# Patient Record
Sex: Female | Born: 1935 | Race: Black or African American | Hispanic: No | Smoking: Never smoker
Health system: Southern US, Community
[De-identification: ages and names within clinical notes are randomized; demographics above are authoritative.]

## PROBLEM LIST (undated history)

## (undated) ENCOUNTER — Emergency Department (HOSPITAL_COMMUNITY): Admission: EM | Payer: Self-pay | Source: Home / Self Care

## (undated) DIAGNOSIS — I4729 Other ventricular tachycardia: Secondary | ICD-10-CM

## (undated) DIAGNOSIS — C55 Malignant neoplasm of uterus, part unspecified: Secondary | ICD-10-CM

## (undated) DIAGNOSIS — I272 Pulmonary hypertension, unspecified: Secondary | ICD-10-CM

## (undated) DIAGNOSIS — G4733 Obstructive sleep apnea (adult) (pediatric): Secondary | ICD-10-CM

## (undated) DIAGNOSIS — E1149 Type 2 diabetes mellitus with other diabetic neurological complication: Secondary | ICD-10-CM

## (undated) DIAGNOSIS — I6340 Cerebral infarction due to embolism of unspecified cerebral artery: Secondary | ICD-10-CM

## (undated) DIAGNOSIS — K922 Gastrointestinal hemorrhage, unspecified: Secondary | ICD-10-CM

## (undated) DIAGNOSIS — I2510 Atherosclerotic heart disease of native coronary artery without angina pectoris: Secondary | ICD-10-CM

## (undated) DIAGNOSIS — E785 Hyperlipidemia, unspecified: Secondary | ICD-10-CM

## (undated) DIAGNOSIS — K219 Gastro-esophageal reflux disease without esophagitis: Secondary | ICD-10-CM

## (undated) DIAGNOSIS — M545 Low back pain: Secondary | ICD-10-CM

## (undated) DIAGNOSIS — IMO0001 Reserved for inherently not codable concepts without codable children: Secondary | ICD-10-CM

## (undated) DIAGNOSIS — R51 Headache: Secondary | ICD-10-CM

## (undated) DIAGNOSIS — R519 Headache, unspecified: Secondary | ICD-10-CM

## (undated) DIAGNOSIS — Z5189 Encounter for other specified aftercare: Secondary | ICD-10-CM

## (undated) DIAGNOSIS — I10 Essential (primary) hypertension: Secondary | ICD-10-CM

## (undated) DIAGNOSIS — I639 Cerebral infarction, unspecified: Secondary | ICD-10-CM

## (undated) DIAGNOSIS — I214 Non-ST elevation (NSTEMI) myocardial infarction: Secondary | ICD-10-CM

## (undated) DIAGNOSIS — B9620 Unspecified Escherichia coli [E. coli] as the cause of diseases classified elsewhere: Secondary | ICD-10-CM

## (undated) DIAGNOSIS — D500 Iron deficiency anemia secondary to blood loss (chronic): Secondary | ICD-10-CM

## (undated) DIAGNOSIS — M810 Age-related osteoporosis without current pathological fracture: Secondary | ICD-10-CM

## (undated) DIAGNOSIS — N390 Urinary tract infection, site not specified: Secondary | ICD-10-CM

## (undated) DIAGNOSIS — I428 Other cardiomyopathies: Secondary | ICD-10-CM

## (undated) DIAGNOSIS — G609 Hereditary and idiopathic neuropathy, unspecified: Secondary | ICD-10-CM

## (undated) DIAGNOSIS — N179 Acute kidney failure, unspecified: Secondary | ICD-10-CM

## (undated) DIAGNOSIS — I219 Acute myocardial infarction, unspecified: Secondary | ICD-10-CM

## (undated) DIAGNOSIS — E119 Type 2 diabetes mellitus without complications: Secondary | ICD-10-CM

## (undated) DIAGNOSIS — I509 Heart failure, unspecified: Secondary | ICD-10-CM

## (undated) DIAGNOSIS — J189 Pneumonia, unspecified organism: Secondary | ICD-10-CM

## (undated) DIAGNOSIS — I209 Angina pectoris, unspecified: Secondary | ICD-10-CM

## (undated) DIAGNOSIS — K5521 Angiodysplasia of colon with hemorrhage: Secondary | ICD-10-CM

## (undated) DIAGNOSIS — N184 Chronic kidney disease, stage 4 (severe): Secondary | ICD-10-CM

## (undated) DIAGNOSIS — I472 Ventricular tachycardia: Secondary | ICD-10-CM

## (undated) HISTORY — DX: Heart failure, unspecified: I50.9

## (undated) HISTORY — DX: Unspecified Escherichia coli (E. coli) as the cause of diseases classified elsewhere: B96.20

## (undated) HISTORY — DX: Other cardiomyopathies: I42.8

## (undated) HISTORY — DX: Acute kidney failure, unspecified: N17.9

## (undated) HISTORY — DX: Urinary tract infection, site not specified: N39.0

## (undated) HISTORY — PX: CATARACT EXTRACTION W/ INTRAOCULAR LENS  IMPLANT, BILATERAL: SHX1307

## (undated) HISTORY — PX: TOE SURGERY: SHX1073

## (undated) HISTORY — PX: TUBAL LIGATION: SHX77

## (undated) HISTORY — DX: Other ventricular tachycardia: I47.29

## (undated) HISTORY — DX: Pulmonary hypertension, unspecified: I27.20

## (undated) HISTORY — PX: VAGINAL HYSTERECTOMY: SUR661

## (undated) HISTORY — DX: Iron deficiency anemia secondary to blood loss (chronic): D50.0

## (undated) HISTORY — DX: Cerebral infarction, unspecified: I63.9

## (undated) HISTORY — DX: Atherosclerotic heart disease of native coronary artery without angina pectoris: I25.10

## (undated) HISTORY — DX: Non-ST elevation (NSTEMI) myocardial infarction: I21.4

## (undated) HISTORY — DX: Ventricular tachycardia: I47.2

---

## 1983-11-04 DIAGNOSIS — E1165 Type 2 diabetes mellitus with hyperglycemia: Secondary | ICD-10-CM

## 1983-11-04 DIAGNOSIS — E119 Type 2 diabetes mellitus without complications: Secondary | ICD-10-CM

## 1983-11-04 HISTORY — DX: Type 2 diabetes mellitus without complications: E11.9

## 1998-03-14 ENCOUNTER — Encounter: Admission: RE | Admit: 1998-03-14 | Discharge: 1998-03-14 | Payer: Self-pay | Admitting: *Deleted

## 1998-06-26 ENCOUNTER — Encounter: Admission: RE | Admit: 1998-06-26 | Discharge: 1998-06-26 | Payer: Self-pay | Admitting: Hematology and Oncology

## 1998-07-03 ENCOUNTER — Encounter: Admission: RE | Admit: 1998-07-03 | Discharge: 1998-10-01 | Payer: Self-pay | Admitting: *Deleted

## 1998-07-10 ENCOUNTER — Encounter: Admission: RE | Admit: 1998-07-10 | Discharge: 1998-07-10 | Payer: Self-pay | Admitting: Hematology and Oncology

## 1998-08-13 ENCOUNTER — Emergency Department (HOSPITAL_COMMUNITY): Admission: EM | Admit: 1998-08-13 | Discharge: 1998-08-13 | Payer: Self-pay | Admitting: Emergency Medicine

## 1998-08-13 ENCOUNTER — Encounter: Payer: Self-pay | Admitting: Emergency Medicine

## 1998-08-23 ENCOUNTER — Emergency Department (HOSPITAL_COMMUNITY): Admission: EM | Admit: 1998-08-23 | Discharge: 1998-08-23 | Payer: Self-pay | Admitting: *Deleted

## 1998-08-23 ENCOUNTER — Encounter: Payer: Self-pay | Admitting: Emergency Medicine

## 1998-08-23 ENCOUNTER — Encounter: Payer: Self-pay | Admitting: *Deleted

## 1998-09-14 ENCOUNTER — Ambulatory Visit (HOSPITAL_BASED_OUTPATIENT_CLINIC_OR_DEPARTMENT_OTHER): Admission: RE | Admit: 1998-09-14 | Discharge: 1998-09-14 | Payer: Self-pay | Admitting: Surgery

## 1998-10-16 ENCOUNTER — Encounter: Admission: RE | Admit: 1998-10-16 | Discharge: 1998-10-16 | Payer: Self-pay | Admitting: Internal Medicine

## 1998-10-19 ENCOUNTER — Encounter: Admission: RE | Admit: 1998-10-19 | Discharge: 1999-01-17 | Payer: Self-pay | Admitting: *Deleted

## 1998-10-29 ENCOUNTER — Encounter: Admission: RE | Admit: 1998-10-29 | Discharge: 1999-01-24 | Payer: Self-pay | Admitting: Internal Medicine

## 1998-10-31 ENCOUNTER — Encounter (HOSPITAL_BASED_OUTPATIENT_CLINIC_OR_DEPARTMENT_OTHER): Payer: Self-pay | Admitting: Internal Medicine

## 1998-11-27 ENCOUNTER — Encounter: Payer: Self-pay | Admitting: Orthopedic Surgery

## 1999-01-18 ENCOUNTER — Encounter: Admission: RE | Admit: 1999-01-18 | Discharge: 1999-04-18 | Payer: Self-pay | Admitting: *Deleted

## 1999-01-21 ENCOUNTER — Encounter: Admission: RE | Admit: 1999-01-21 | Discharge: 1999-01-21 | Payer: Self-pay | Admitting: Internal Medicine

## 1999-01-22 ENCOUNTER — Encounter: Payer: Self-pay | Admitting: Orthopedic Surgery

## 1999-03-29 ENCOUNTER — Encounter: Admission: RE | Admit: 1999-03-29 | Discharge: 1999-06-27 | Payer: Self-pay | Admitting: *Deleted

## 1999-04-23 ENCOUNTER — Encounter: Admission: RE | Admit: 1999-04-23 | Discharge: 1999-07-22 | Payer: Self-pay | Admitting: Orthopedic Surgery

## 1999-05-27 ENCOUNTER — Encounter: Admission: RE | Admit: 1999-05-27 | Discharge: 1999-05-27 | Payer: Self-pay | Admitting: Internal Medicine

## 1999-06-28 ENCOUNTER — Encounter: Admission: RE | Admit: 1999-06-28 | Discharge: 1999-09-26 | Payer: Self-pay | Admitting: *Deleted

## 1999-07-29 ENCOUNTER — Encounter: Admission: RE | Admit: 1999-07-29 | Discharge: 1999-10-27 | Payer: Self-pay | Admitting: Orthopedic Surgery

## 1999-09-02 ENCOUNTER — Encounter (INDEPENDENT_AMBULATORY_CARE_PROVIDER_SITE_OTHER): Payer: Self-pay | Admitting: *Deleted

## 1999-09-02 ENCOUNTER — Other Ambulatory Visit: Admission: RE | Admit: 1999-09-02 | Discharge: 1999-09-02 | Payer: Self-pay | Admitting: Obstetrics

## 1999-09-02 ENCOUNTER — Encounter: Admission: RE | Admit: 1999-09-02 | Discharge: 1999-09-02 | Payer: Self-pay | Admitting: Internal Medicine

## 1999-09-20 ENCOUNTER — Encounter: Admission: RE | Admit: 1999-09-20 | Discharge: 1999-09-20 | Payer: Self-pay | Admitting: *Deleted

## 1999-11-04 ENCOUNTER — Encounter: Admission: RE | Admit: 1999-11-04 | Discharge: 1999-11-04 | Payer: Self-pay | Admitting: Internal Medicine

## 1999-11-05 ENCOUNTER — Encounter: Admission: RE | Admit: 1999-11-05 | Discharge: 1999-11-05 | Payer: Self-pay | Admitting: Internal Medicine

## 1999-11-05 ENCOUNTER — Encounter: Admission: RE | Admit: 1999-11-05 | Discharge: 2000-02-03 | Payer: Self-pay | Admitting: Internal Medicine

## 1999-12-26 ENCOUNTER — Encounter: Admission: RE | Admit: 1999-12-26 | Discharge: 1999-12-26 | Payer: Self-pay | Admitting: Internal Medicine

## 2000-01-30 ENCOUNTER — Encounter: Admission: RE | Admit: 2000-01-30 | Discharge: 2000-04-29 | Payer: Self-pay | Admitting: *Deleted

## 2000-02-07 ENCOUNTER — Encounter: Admission: RE | Admit: 2000-02-07 | Discharge: 2000-02-07 | Payer: Self-pay | Admitting: Internal Medicine

## 2000-02-25 ENCOUNTER — Encounter: Admission: RE | Admit: 2000-02-25 | Discharge: 2000-05-25 | Payer: Self-pay | Admitting: Orthopedic Surgery

## 2000-04-22 ENCOUNTER — Encounter: Admission: RE | Admit: 2000-04-22 | Discharge: 2000-04-22 | Payer: Self-pay | Admitting: Hematology and Oncology

## 2000-04-23 ENCOUNTER — Encounter: Admission: RE | Admit: 2000-04-23 | Discharge: 2000-04-23 | Payer: Self-pay | Admitting: Hematology and Oncology

## 2000-05-28 ENCOUNTER — Encounter: Admission: RE | Admit: 2000-05-28 | Discharge: 2000-08-26 | Payer: Self-pay | Admitting: *Deleted

## 2000-06-02 ENCOUNTER — Encounter: Admission: RE | Admit: 2000-06-02 | Discharge: 2000-08-31 | Payer: Self-pay | Admitting: Orthopedic Surgery

## 2000-08-05 ENCOUNTER — Encounter: Admission: RE | Admit: 2000-08-05 | Discharge: 2000-08-05 | Payer: Self-pay | Admitting: Internal Medicine

## 2000-09-07 ENCOUNTER — Encounter: Admission: RE | Admit: 2000-09-07 | Discharge: 2000-09-10 | Payer: Self-pay | Admitting: Orthopedic Surgery

## 2000-09-21 ENCOUNTER — Encounter: Admission: RE | Admit: 2000-09-21 | Discharge: 2000-09-21 | Payer: Self-pay | Admitting: *Deleted

## 2000-10-23 ENCOUNTER — Encounter: Admission: RE | Admit: 2000-10-23 | Discharge: 2001-01-21 | Payer: Self-pay | Admitting: *Deleted

## 2000-11-26 ENCOUNTER — Encounter: Admission: RE | Admit: 2000-11-26 | Discharge: 2000-11-26 | Payer: Self-pay

## 2000-12-28 ENCOUNTER — Encounter: Admission: RE | Admit: 2000-12-28 | Discharge: 2001-01-04 | Payer: Self-pay | Admitting: Orthopedic Surgery

## 2001-03-02 ENCOUNTER — Encounter: Admission: RE | Admit: 2001-03-02 | Discharge: 2001-03-02 | Payer: Self-pay | Admitting: Hematology and Oncology

## 2001-03-19 ENCOUNTER — Encounter: Admission: RE | Admit: 2001-03-19 | Discharge: 2001-06-17 | Payer: Self-pay | Admitting: *Deleted

## 2001-07-20 ENCOUNTER — Encounter: Admission: RE | Admit: 2001-07-20 | Discharge: 2001-07-20 | Payer: Self-pay | Admitting: *Deleted

## 2001-09-22 ENCOUNTER — Encounter: Admission: RE | Admit: 2001-09-22 | Discharge: 2001-09-22 | Payer: Self-pay

## 2001-11-14 ENCOUNTER — Encounter (INDEPENDENT_AMBULATORY_CARE_PROVIDER_SITE_OTHER): Payer: Self-pay | Admitting: *Deleted

## 2001-11-14 LAB — CONVERTED CEMR LAB: Pap Smear: NORMAL

## 2001-11-16 ENCOUNTER — Encounter: Admission: RE | Admit: 2001-11-16 | Discharge: 2001-11-16 | Payer: Self-pay | Admitting: *Deleted

## 2002-03-22 ENCOUNTER — Encounter: Admission: RE | Admit: 2002-03-22 | Discharge: 2002-03-22 | Payer: Self-pay | Admitting: *Deleted

## 2002-05-17 ENCOUNTER — Ambulatory Visit (HOSPITAL_COMMUNITY): Admission: RE | Admit: 2002-05-17 | Discharge: 2002-05-17 | Payer: Self-pay | Admitting: Cardiology

## 2002-05-17 ENCOUNTER — Encounter: Payer: Self-pay | Admitting: Cardiology

## 2002-07-14 ENCOUNTER — Encounter: Admission: RE | Admit: 2002-07-14 | Discharge: 2002-07-14 | Payer: Self-pay | Admitting: Internal Medicine

## 2002-09-19 ENCOUNTER — Encounter: Payer: Self-pay | Admitting: Internal Medicine

## 2002-09-19 ENCOUNTER — Encounter: Admission: RE | Admit: 2002-09-19 | Discharge: 2002-09-19 | Payer: Self-pay | Admitting: Internal Medicine

## 2002-11-16 ENCOUNTER — Encounter: Payer: Self-pay | Admitting: Internal Medicine

## 2002-11-16 ENCOUNTER — Encounter: Admission: RE | Admit: 2002-11-16 | Discharge: 2002-11-16 | Payer: Self-pay | Admitting: Internal Medicine

## 2002-11-16 ENCOUNTER — Ambulatory Visit (HOSPITAL_COMMUNITY): Admission: RE | Admit: 2002-11-16 | Discharge: 2002-11-16 | Payer: Self-pay | Admitting: Internal Medicine

## 2003-01-10 ENCOUNTER — Encounter: Admission: RE | Admit: 2003-01-10 | Discharge: 2003-01-10 | Payer: Self-pay | Admitting: Internal Medicine

## 2003-03-16 ENCOUNTER — Encounter: Admission: RE | Admit: 2003-03-16 | Discharge: 2003-03-16 | Payer: Self-pay | Admitting: Internal Medicine

## 2003-06-20 ENCOUNTER — Encounter: Admission: RE | Admit: 2003-06-20 | Discharge: 2003-06-20 | Payer: Self-pay | Admitting: Internal Medicine

## 2003-07-24 ENCOUNTER — Encounter: Admission: RE | Admit: 2003-07-24 | Discharge: 2003-07-24 | Payer: Self-pay | Admitting: Internal Medicine

## 2003-09-22 ENCOUNTER — Encounter: Admission: RE | Admit: 2003-09-22 | Discharge: 2003-09-22 | Payer: Self-pay | Admitting: Internal Medicine

## 2003-10-02 ENCOUNTER — Encounter: Admission: RE | Admit: 2003-10-02 | Discharge: 2003-10-02 | Payer: Self-pay | Admitting: Internal Medicine

## 2003-10-20 ENCOUNTER — Encounter: Admission: RE | Admit: 2003-10-20 | Discharge: 2003-10-20 | Payer: Self-pay | Admitting: Internal Medicine

## 2003-11-13 ENCOUNTER — Encounter: Admission: RE | Admit: 2003-11-13 | Discharge: 2003-11-13 | Payer: Self-pay | Admitting: Internal Medicine

## 2004-02-12 ENCOUNTER — Encounter: Admission: RE | Admit: 2004-02-12 | Discharge: 2004-02-12 | Payer: Self-pay | Admitting: Internal Medicine

## 2004-02-19 ENCOUNTER — Encounter: Admission: RE | Admit: 2004-02-19 | Discharge: 2004-02-19 | Payer: Self-pay | Admitting: Internal Medicine

## 2004-03-04 ENCOUNTER — Encounter: Admission: RE | Admit: 2004-03-04 | Discharge: 2004-03-04 | Payer: Self-pay | Admitting: Internal Medicine

## 2004-03-18 ENCOUNTER — Encounter: Admission: RE | Admit: 2004-03-18 | Discharge: 2004-03-18 | Payer: Self-pay | Admitting: Internal Medicine

## 2004-07-04 ENCOUNTER — Ambulatory Visit: Payer: Self-pay | Admitting: Internal Medicine

## 2004-10-28 ENCOUNTER — Ambulatory Visit: Payer: Self-pay

## 2004-11-05 ENCOUNTER — Encounter: Admission: RE | Admit: 2004-11-05 | Discharge: 2004-11-05 | Payer: Self-pay | Admitting: Internal Medicine

## 2004-11-27 ENCOUNTER — Emergency Department (HOSPITAL_COMMUNITY): Admission: EM | Admit: 2004-11-27 | Discharge: 2004-11-27 | Payer: Self-pay | Admitting: Emergency Medicine

## 2004-12-17 ENCOUNTER — Ambulatory Visit: Payer: Self-pay | Admitting: Internal Medicine

## 2004-12-20 ENCOUNTER — Emergency Department (HOSPITAL_COMMUNITY): Admission: EM | Admit: 2004-12-20 | Discharge: 2004-12-20 | Payer: Self-pay | Admitting: Emergency Medicine

## 2004-12-26 ENCOUNTER — Ambulatory Visit: Payer: Self-pay | Admitting: Internal Medicine

## 2005-02-13 ENCOUNTER — Ambulatory Visit: Payer: Self-pay | Admitting: Internal Medicine

## 2005-07-07 ENCOUNTER — Inpatient Hospital Stay (HOSPITAL_COMMUNITY): Admission: EM | Admit: 2005-07-07 | Discharge: 2005-07-11 | Payer: Self-pay | Admitting: Emergency Medicine

## 2005-07-08 ENCOUNTER — Encounter (INDEPENDENT_AMBULATORY_CARE_PROVIDER_SITE_OTHER): Payer: Self-pay | Admitting: *Deleted

## 2005-07-08 ENCOUNTER — Ambulatory Visit: Payer: Self-pay | Admitting: Internal Medicine

## 2005-08-06 ENCOUNTER — Ambulatory Visit (HOSPITAL_COMMUNITY): Admission: RE | Admit: 2005-08-06 | Discharge: 2005-08-06 | Payer: Self-pay | Admitting: Internal Medicine

## 2005-08-06 ENCOUNTER — Ambulatory Visit: Payer: Self-pay | Admitting: Internal Medicine

## 2005-08-07 ENCOUNTER — Ambulatory Visit (HOSPITAL_COMMUNITY): Admission: RE | Admit: 2005-08-07 | Discharge: 2005-08-07 | Payer: Self-pay | Admitting: Internal Medicine

## 2005-08-26 ENCOUNTER — Ambulatory Visit (HOSPITAL_COMMUNITY): Admission: RE | Admit: 2005-08-26 | Discharge: 2005-08-26 | Payer: Self-pay | Admitting: Gastroenterology

## 2005-09-08 ENCOUNTER — Ambulatory Visit: Payer: Self-pay | Admitting: Internal Medicine

## 2005-09-22 ENCOUNTER — Ambulatory Visit (HOSPITAL_COMMUNITY): Admission: RE | Admit: 2005-09-22 | Discharge: 2005-09-22 | Payer: Self-pay | Admitting: Gastroenterology

## 2005-10-17 HISTORY — PX: OTHER SURGICAL HISTORY: SHX169

## 2005-10-23 ENCOUNTER — Ambulatory Visit: Payer: Self-pay | Admitting: Internal Medicine

## 2005-11-06 ENCOUNTER — Ambulatory Visit (HOSPITAL_COMMUNITY): Admission: RE | Admit: 2005-11-06 | Discharge: 2005-11-06 | Payer: Self-pay | Admitting: Family Medicine

## 2006-01-22 ENCOUNTER — Ambulatory Visit: Payer: Self-pay | Admitting: Internal Medicine

## 2006-01-23 ENCOUNTER — Ambulatory Visit: Payer: Self-pay | Admitting: Internal Medicine

## 2006-05-20 ENCOUNTER — Ambulatory Visit: Payer: Self-pay | Admitting: Internal Medicine

## 2006-05-20 ENCOUNTER — Encounter (INDEPENDENT_AMBULATORY_CARE_PROVIDER_SITE_OTHER): Payer: Self-pay | Admitting: *Deleted

## 2006-07-24 ENCOUNTER — Ambulatory Visit: Payer: Self-pay | Admitting: Hospitalist

## 2006-07-24 ENCOUNTER — Inpatient Hospital Stay (HOSPITAL_COMMUNITY): Admission: AD | Admit: 2006-07-24 | Discharge: 2006-07-25 | Payer: Self-pay | Admitting: Hospitalist

## 2006-07-24 ENCOUNTER — Ambulatory Visit: Payer: Self-pay | Admitting: Internal Medicine

## 2006-08-03 ENCOUNTER — Encounter (INDEPENDENT_AMBULATORY_CARE_PROVIDER_SITE_OTHER): Payer: Self-pay | Admitting: *Deleted

## 2006-08-03 DIAGNOSIS — K219 Gastro-esophageal reflux disease without esophagitis: Secondary | ICD-10-CM

## 2006-08-03 DIAGNOSIS — G609 Hereditary and idiopathic neuropathy, unspecified: Secondary | ICD-10-CM | POA: Insufficient documentation

## 2006-08-03 DIAGNOSIS — I10 Essential (primary) hypertension: Secondary | ICD-10-CM

## 2006-08-03 DIAGNOSIS — J45909 Unspecified asthma, uncomplicated: Secondary | ICD-10-CM | POA: Insufficient documentation

## 2006-08-03 DIAGNOSIS — M545 Low back pain, unspecified: Secondary | ICD-10-CM

## 2006-08-03 DIAGNOSIS — E669 Obesity, unspecified: Secondary | ICD-10-CM | POA: Insufficient documentation

## 2006-08-03 DIAGNOSIS — E1149 Type 2 diabetes mellitus with other diabetic neurological complication: Secondary | ICD-10-CM

## 2006-08-03 DIAGNOSIS — E785 Hyperlipidemia, unspecified: Secondary | ICD-10-CM

## 2006-08-03 DIAGNOSIS — I5032 Chronic diastolic (congestive) heart failure: Secondary | ICD-10-CM | POA: Insufficient documentation

## 2006-08-03 HISTORY — DX: Low back pain, unspecified: M54.50

## 2006-08-03 HISTORY — DX: Essential (primary) hypertension: I10

## 2006-08-03 HISTORY — DX: Type 2 diabetes mellitus with other diabetic neurological complication: E11.49

## 2006-08-03 HISTORY — DX: Hyperlipidemia, unspecified: E78.5

## 2006-08-03 HISTORY — DX: Hereditary and idiopathic neuropathy, unspecified: G60.9

## 2006-08-03 HISTORY — DX: Gastro-esophageal reflux disease without esophagitis: K21.9

## 2006-08-06 ENCOUNTER — Ambulatory Visit: Payer: Self-pay | Admitting: Internal Medicine

## 2006-08-06 ENCOUNTER — Encounter (INDEPENDENT_AMBULATORY_CARE_PROVIDER_SITE_OTHER): Payer: Self-pay | Admitting: *Deleted

## 2006-08-06 LAB — CONVERTED CEMR LAB
BUN: 13 mg/dL (ref 6–23)
Calcium: 9.4 mg/dL (ref 8.4–10.5)
Chloride: 107 meq/L (ref 96–112)
Creatinine, Ser: 1.03 mg/dL (ref 0.40–1.20)
Creatinine, Urine: 85.6 mg/dL
Glucose, Bld: 73 mg/dL (ref 70–99)
Microalb, Ur: 27.7 mg/dL — ABNORMAL HIGH (ref 0.00–1.89)
Microalbumin U total vol: 27.7 mg/L

## 2006-08-07 HISTORY — PX: CARDIOVASCULAR STRESS TEST: SHX262

## 2006-08-20 ENCOUNTER — Ambulatory Visit: Payer: Self-pay | Admitting: Hospitalist

## 2006-08-31 ENCOUNTER — Ambulatory Visit (HOSPITAL_COMMUNITY): Admission: RE | Admit: 2006-08-31 | Discharge: 2006-08-31 | Payer: Self-pay | Admitting: Internal Medicine

## 2006-09-09 ENCOUNTER — Ambulatory Visit: Payer: Self-pay | Admitting: Internal Medicine

## 2006-10-16 ENCOUNTER — Encounter (INDEPENDENT_AMBULATORY_CARE_PROVIDER_SITE_OTHER): Payer: Self-pay | Admitting: *Deleted

## 2006-10-16 ENCOUNTER — Ambulatory Visit: Payer: Self-pay | Admitting: Hospitalist

## 2006-10-16 LAB — CONVERTED CEMR LAB
HDL: 89 mg/dL (ref >39)
LDL Cholesterol: 95 mg/dL (ref 0–99)
Triglycerides: 66 mg/dL (ref <150)
VLDL: 13 mg/dL (ref 0–40)

## 2006-10-23 ENCOUNTER — Encounter (INDEPENDENT_AMBULATORY_CARE_PROVIDER_SITE_OTHER): Payer: Self-pay | Admitting: *Deleted

## 2006-10-23 ENCOUNTER — Ambulatory Visit: Payer: Self-pay | Admitting: Internal Medicine

## 2006-10-23 LAB — CONVERTED CEMR LAB
ALT: 9 units/L (ref 0–35)
AST: 12 units/L (ref 0–37)
Alkaline Phosphatase: 74 units/L (ref 39–117)
BUN: 22 mg/dL (ref 6–23)
CO2: 28 meq/L (ref 19–32)
Glucose, Bld: 273 mg/dL — ABNORMAL HIGH (ref 70–99)
Potassium: 4.1 meq/L (ref 3.5–5.3)
Total Protein: 7.2 g/dL (ref 6.0–8.3)

## 2006-11-09 ENCOUNTER — Ambulatory Visit (HOSPITAL_COMMUNITY): Admission: RE | Admit: 2006-11-09 | Discharge: 2006-11-09 | Payer: Self-pay | Admitting: Internal Medicine

## 2006-11-19 ENCOUNTER — Emergency Department (HOSPITAL_COMMUNITY): Admission: EM | Admit: 2006-11-19 | Discharge: 2006-11-19 | Payer: Self-pay | Admitting: Family Medicine

## 2007-02-16 ENCOUNTER — Ambulatory Visit: Payer: Self-pay | Admitting: Internal Medicine

## 2007-02-16 DIAGNOSIS — J309 Allergic rhinitis, unspecified: Secondary | ICD-10-CM | POA: Insufficient documentation

## 2007-06-11 ENCOUNTER — Telehealth: Payer: Self-pay | Admitting: *Deleted

## 2007-06-29 ENCOUNTER — Ambulatory Visit: Payer: Self-pay | Admitting: Hospitalist

## 2007-06-29 ENCOUNTER — Encounter (INDEPENDENT_AMBULATORY_CARE_PROVIDER_SITE_OTHER): Payer: Self-pay | Admitting: *Deleted

## 2007-06-29 DIAGNOSIS — M2550 Pain in unspecified joint: Secondary | ICD-10-CM

## 2007-06-29 LAB — CONVERTED CEMR LAB
AST: 16 units/L (ref 0–37)
Blood Glucose, Fingerstick: 183
Chloride: 105 meq/L (ref 96–112)
Total Bilirubin: 0.4 mg/dL (ref 0.3–1.2)

## 2007-07-30 ENCOUNTER — Ambulatory Visit: Payer: Self-pay | Admitting: *Deleted

## 2007-07-30 LAB — CONVERTED CEMR LAB: Blood Glucose, Home Monitor: 2 mg/dL

## 2007-09-17 ENCOUNTER — Ambulatory Visit: Payer: Self-pay | Admitting: Internal Medicine

## 2007-10-02 ENCOUNTER — Emergency Department (HOSPITAL_COMMUNITY): Admission: EM | Admit: 2007-10-02 | Discharge: 2007-10-02 | Payer: Self-pay | Admitting: Emergency Medicine

## 2007-11-09 ENCOUNTER — Encounter (INDEPENDENT_AMBULATORY_CARE_PROVIDER_SITE_OTHER): Payer: Self-pay | Admitting: *Deleted

## 2007-11-10 ENCOUNTER — Telehealth (INDEPENDENT_AMBULATORY_CARE_PROVIDER_SITE_OTHER): Payer: Self-pay | Admitting: *Deleted

## 2007-11-26 ENCOUNTER — Ambulatory Visit (HOSPITAL_COMMUNITY): Admission: RE | Admit: 2007-11-26 | Discharge: 2007-11-26 | Payer: Self-pay | Admitting: Infectious Diseases

## 2007-12-09 ENCOUNTER — Ambulatory Visit: Payer: Self-pay | Admitting: Internal Medicine

## 2007-12-09 LAB — CONVERTED CEMR LAB: Blood Glucose, Fingerstick: 291

## 2008-01-04 ENCOUNTER — Emergency Department (HOSPITAL_COMMUNITY): Admission: EM | Admit: 2008-01-04 | Discharge: 2008-01-04 | Payer: Self-pay | Admitting: Emergency Medicine

## 2008-01-06 ENCOUNTER — Telehealth: Payer: Self-pay | Admitting: *Deleted

## 2008-01-06 ENCOUNTER — Ambulatory Visit (HOSPITAL_COMMUNITY): Admission: RE | Admit: 2008-01-06 | Discharge: 2008-01-06 | Payer: Self-pay | Admitting: *Deleted

## 2008-01-06 ENCOUNTER — Ambulatory Visit: Payer: Self-pay | Admitting: *Deleted

## 2008-01-06 ENCOUNTER — Encounter (INDEPENDENT_AMBULATORY_CARE_PROVIDER_SITE_OTHER): Payer: Self-pay | Admitting: Internal Medicine

## 2008-01-06 DIAGNOSIS — D500 Iron deficiency anemia secondary to blood loss (chronic): Secondary | ICD-10-CM

## 2008-01-06 DIAGNOSIS — G4733 Obstructive sleep apnea (adult) (pediatric): Secondary | ICD-10-CM

## 2008-01-06 HISTORY — DX: Obstructive sleep apnea (adult) (pediatric): G47.33

## 2008-01-06 HISTORY — DX: Iron deficiency anemia secondary to blood loss (chronic): D50.0

## 2008-01-09 LAB — CONVERTED CEMR LAB
Calcium: 9.5 mg/dL (ref 8.4–10.5)
Chloride: 105 meq/L (ref 96–112)
Creatinine, Ser: 1.43 mg/dL — ABNORMAL HIGH (ref 0.40–1.20)
Glucose, Bld: 271 mg/dL — ABNORMAL HIGH (ref 70–99)
MCHC: 32 g/dL (ref 30.0–36.0)
MCV: 92 fL (ref 78.0–100.0)
Potassium: 3.9 meq/L (ref 3.5–5.3)
RBC: 3.98 M/uL (ref 3.87–5.11)
RDW: 14.7 % (ref 11.5–15.5)
Sodium: 143 meq/L (ref 135–145)
WBC: 6.4 10*3/uL (ref 4.0–10.5)

## 2008-01-10 ENCOUNTER — Telehealth: Payer: Self-pay | Admitting: *Deleted

## 2008-01-12 ENCOUNTER — Encounter (INDEPENDENT_AMBULATORY_CARE_PROVIDER_SITE_OTHER): Payer: Self-pay | Admitting: *Deleted

## 2008-01-12 ENCOUNTER — Ambulatory Visit: Payer: Self-pay | Admitting: *Deleted

## 2008-01-12 LAB — CONVERTED CEMR LAB
Hemoglobin: 12.4 g/dL (ref 12.0–15.0)
MCHC: 33.4 g/dL (ref 30.0–36.0)
RBC: 4.1 M/uL (ref 3.87–5.11)
RDW: 14.7 % (ref 11.5–15.5)

## 2008-01-13 LAB — CONVERTED CEMR LAB
CO2: 22 meq/L (ref 19–32)
Chloride: 105 meq/L (ref 96–112)
Creatinine, Ser: 0.89 mg/dL (ref 0.40–1.20)
Iron: 112 ug/dL (ref 42–145)
Saturation Ratios: 32 % (ref 20–55)
Sodium: 141 meq/L (ref 135–145)
TIBC: 349 ug/dL (ref 250–470)
TSH: 1.546 microintl units/mL (ref 0.350–5.50)
UIBC: 237 ug/dL
Vitamin B-12: 434 pg/mL (ref 211–911)

## 2008-01-20 ENCOUNTER — Telehealth: Payer: Self-pay | Admitting: *Deleted

## 2008-02-01 ENCOUNTER — Encounter (INDEPENDENT_AMBULATORY_CARE_PROVIDER_SITE_OTHER): Payer: Self-pay | Admitting: *Deleted

## 2008-02-21 ENCOUNTER — Telehealth (INDEPENDENT_AMBULATORY_CARE_PROVIDER_SITE_OTHER): Payer: Self-pay | Admitting: *Deleted

## 2008-03-20 ENCOUNTER — Telehealth: Payer: Self-pay | Admitting: *Deleted

## 2008-05-02 ENCOUNTER — Telehealth (INDEPENDENT_AMBULATORY_CARE_PROVIDER_SITE_OTHER): Payer: Self-pay | Admitting: *Deleted

## 2008-05-18 ENCOUNTER — Ambulatory Visit: Payer: Self-pay | Admitting: Infectious Diseases

## 2008-05-18 DIAGNOSIS — K5900 Constipation, unspecified: Secondary | ICD-10-CM | POA: Insufficient documentation

## 2008-07-10 ENCOUNTER — Ambulatory Visit: Payer: Self-pay | Admitting: Infectious Diseases

## 2008-07-10 ENCOUNTER — Inpatient Hospital Stay (HOSPITAL_COMMUNITY): Admission: EM | Admit: 2008-07-10 | Discharge: 2008-07-13 | Payer: Self-pay | Admitting: Emergency Medicine

## 2008-07-11 ENCOUNTER — Encounter: Payer: Self-pay | Admitting: Infectious Diseases

## 2008-07-12 ENCOUNTER — Ambulatory Visit: Payer: Self-pay | Admitting: Vascular Surgery

## 2008-07-12 ENCOUNTER — Encounter: Payer: Self-pay | Admitting: Internal Medicine

## 2008-07-17 ENCOUNTER — Encounter (INDEPENDENT_AMBULATORY_CARE_PROVIDER_SITE_OTHER): Payer: Self-pay | Admitting: *Deleted

## 2008-07-24 ENCOUNTER — Ambulatory Visit: Payer: Self-pay | Admitting: Internal Medicine

## 2008-07-24 DIAGNOSIS — G4700 Insomnia, unspecified: Secondary | ICD-10-CM

## 2008-07-24 DIAGNOSIS — M1990 Unspecified osteoarthritis, unspecified site: Secondary | ICD-10-CM | POA: Insufficient documentation

## 2008-07-26 ENCOUNTER — Encounter (INDEPENDENT_AMBULATORY_CARE_PROVIDER_SITE_OTHER): Payer: Self-pay | Admitting: *Deleted

## 2008-08-15 ENCOUNTER — Encounter (INDEPENDENT_AMBULATORY_CARE_PROVIDER_SITE_OTHER): Payer: Self-pay | Admitting: *Deleted

## 2008-12-04 ENCOUNTER — Encounter (INDEPENDENT_AMBULATORY_CARE_PROVIDER_SITE_OTHER): Payer: Self-pay | Admitting: *Deleted

## 2008-12-04 ENCOUNTER — Ambulatory Visit: Payer: Self-pay | Admitting: Internal Medicine

## 2008-12-05 ENCOUNTER — Encounter (INDEPENDENT_AMBULATORY_CARE_PROVIDER_SITE_OTHER): Payer: Self-pay | Admitting: *Deleted

## 2008-12-14 ENCOUNTER — Telehealth (INDEPENDENT_AMBULATORY_CARE_PROVIDER_SITE_OTHER): Payer: Self-pay | Admitting: *Deleted

## 2008-12-22 ENCOUNTER — Ambulatory Visit (HOSPITAL_COMMUNITY): Admission: RE | Admit: 2008-12-22 | Discharge: 2008-12-22 | Payer: Self-pay | Admitting: *Deleted

## 2008-12-27 ENCOUNTER — Encounter (INDEPENDENT_AMBULATORY_CARE_PROVIDER_SITE_OTHER): Payer: Self-pay | Admitting: *Deleted

## 2008-12-28 ENCOUNTER — Encounter (INDEPENDENT_AMBULATORY_CARE_PROVIDER_SITE_OTHER): Payer: Self-pay | Admitting: *Deleted

## 2008-12-28 ENCOUNTER — Ambulatory Visit: Payer: Self-pay | Admitting: Internal Medicine

## 2008-12-28 LAB — CONVERTED CEMR LAB
ALT: 11 units/L (ref 0–35)
AST: 13 units/L (ref 0–37)
Alkaline Phosphatase: 71 units/L (ref 39–117)
CO2: 24 meq/L (ref 19–32)
Cholesterol: 187 mg/dL (ref 0–200)
LDL Cholesterol: 91 mg/dL (ref 0–99)
Potassium: 3.4 meq/L — ABNORMAL LOW (ref 3.5–5.3)
Sodium: 143 meq/L (ref 135–145)
Total Bilirubin: 0.3 mg/dL (ref 0.3–1.2)

## 2009-01-09 ENCOUNTER — Telehealth (INDEPENDENT_AMBULATORY_CARE_PROVIDER_SITE_OTHER): Payer: Self-pay | Admitting: *Deleted

## 2009-01-17 ENCOUNTER — Telehealth (INDEPENDENT_AMBULATORY_CARE_PROVIDER_SITE_OTHER): Payer: Self-pay | Admitting: *Deleted

## 2009-02-14 ENCOUNTER — Telehealth (INDEPENDENT_AMBULATORY_CARE_PROVIDER_SITE_OTHER): Payer: Self-pay | Admitting: *Deleted

## 2009-03-07 ENCOUNTER — Ambulatory Visit: Payer: Self-pay | Admitting: Infectious Disease

## 2009-03-07 ENCOUNTER — Ambulatory Visit (HOSPITAL_COMMUNITY): Admission: RE | Admit: 2009-03-07 | Discharge: 2009-03-07 | Payer: Self-pay | Admitting: Infectious Disease

## 2009-03-07 LAB — CONVERTED CEMR LAB
Blood Glucose, Fingerstick: 225
Blood Glucose, Home Monitor: 3 mg/dL

## 2009-03-14 ENCOUNTER — Encounter (INDEPENDENT_AMBULATORY_CARE_PROVIDER_SITE_OTHER): Payer: Self-pay | Admitting: *Deleted

## 2009-03-14 ENCOUNTER — Ambulatory Visit (HOSPITAL_COMMUNITY): Admission: RE | Admit: 2009-03-14 | Discharge: 2009-03-14 | Payer: Self-pay | Admitting: *Deleted

## 2009-03-21 DIAGNOSIS — M810 Age-related osteoporosis without current pathological fracture: Secondary | ICD-10-CM | POA: Insufficient documentation

## 2009-03-21 HISTORY — DX: Age-related osteoporosis without current pathological fracture: M81.0

## 2009-04-03 ENCOUNTER — Telehealth (INDEPENDENT_AMBULATORY_CARE_PROVIDER_SITE_OTHER): Payer: Self-pay | Admitting: *Deleted

## 2009-06-12 ENCOUNTER — Telehealth: Payer: Self-pay | Admitting: Internal Medicine

## 2009-07-12 ENCOUNTER — Ambulatory Visit: Payer: Self-pay | Admitting: Internal Medicine

## 2009-07-12 DIAGNOSIS — R519 Headache, unspecified: Secondary | ICD-10-CM | POA: Insufficient documentation

## 2009-07-12 DIAGNOSIS — R51 Headache: Secondary | ICD-10-CM

## 2009-07-15 LAB — CONVERTED CEMR LAB
Albumin: 3.9 g/dL (ref 3.5–5.2)
Alkaline Phosphatase: 61 units/L (ref 39–117)
Chloride: 104 meq/L (ref 96–112)
Glucose, Bld: 315 mg/dL — ABNORMAL HIGH (ref 70–99)
LDL Cholesterol: 152 mg/dL — ABNORMAL HIGH (ref 0–99)
Microalb, Ur: 6.05 mg/dL — ABNORMAL HIGH (ref 0.00–1.89)
Potassium: 4.5 meq/L (ref 3.5–5.3)
Sodium: 143 meq/L (ref 135–145)
Total Protein: 7 g/dL (ref 6.0–8.3)
Triglycerides: 80 mg/dL (ref <150)

## 2009-07-16 ENCOUNTER — Encounter: Payer: Self-pay | Admitting: Licensed Clinical Social Worker

## 2009-07-20 ENCOUNTER — Encounter: Payer: Self-pay | Admitting: Internal Medicine

## 2009-07-20 LAB — HM DIABETES EYE EXAM

## 2009-07-27 ENCOUNTER — Ambulatory Visit: Payer: Self-pay | Admitting: Internal Medicine

## 2009-08-01 ENCOUNTER — Telehealth: Payer: Self-pay | Admitting: Internal Medicine

## 2009-08-02 ENCOUNTER — Telehealth: Payer: Self-pay | Admitting: *Deleted

## 2009-08-15 ENCOUNTER — Telehealth (INDEPENDENT_AMBULATORY_CARE_PROVIDER_SITE_OTHER): Payer: Self-pay | Admitting: *Deleted

## 2009-09-14 ENCOUNTER — Telehealth: Payer: Self-pay | Admitting: Internal Medicine

## 2009-11-12 ENCOUNTER — Telehealth: Payer: Self-pay | Admitting: Internal Medicine

## 2009-12-28 ENCOUNTER — Ambulatory Visit (HOSPITAL_COMMUNITY): Admission: RE | Admit: 2009-12-28 | Discharge: 2009-12-28 | Payer: Self-pay | Admitting: Family Medicine

## 2009-12-28 LAB — HM MAMMOGRAPHY: HM Mammogram: NEGATIVE

## 2010-01-02 ENCOUNTER — Telehealth: Payer: Self-pay | Admitting: Internal Medicine

## 2010-02-04 ENCOUNTER — Telehealth: Payer: Self-pay | Admitting: Internal Medicine

## 2010-02-22 ENCOUNTER — Telehealth: Payer: Self-pay | Admitting: Internal Medicine

## 2010-03-12 ENCOUNTER — Telehealth: Payer: Self-pay | Admitting: Internal Medicine

## 2010-03-18 ENCOUNTER — Telehealth: Payer: Self-pay | Admitting: *Deleted

## 2010-04-30 ENCOUNTER — Inpatient Hospital Stay (HOSPITAL_COMMUNITY): Admission: EM | Admit: 2010-04-30 | Discharge: 2010-05-01 | Payer: Self-pay | Admitting: Emergency Medicine

## 2010-04-30 ENCOUNTER — Ambulatory Visit: Payer: Self-pay | Admitting: Internal Medicine

## 2010-04-30 ENCOUNTER — Encounter: Payer: Self-pay | Admitting: Ophthalmology

## 2010-05-01 ENCOUNTER — Encounter: Payer: Self-pay | Admitting: Internal Medicine

## 2010-05-02 ENCOUNTER — Telehealth: Payer: Self-pay | Admitting: Internal Medicine

## 2010-05-03 ENCOUNTER — Telehealth: Payer: Self-pay | Admitting: Internal Medicine

## 2010-05-13 DIAGNOSIS — I428 Other cardiomyopathies: Secondary | ICD-10-CM

## 2010-05-13 HISTORY — DX: Other cardiomyopathies: I42.8

## 2010-05-14 ENCOUNTER — Encounter: Payer: Self-pay | Admitting: Internal Medicine

## 2010-05-15 ENCOUNTER — Encounter: Admission: RE | Admit: 2010-05-15 | Discharge: 2010-05-15 | Payer: Self-pay | Admitting: Cardiology

## 2010-05-16 ENCOUNTER — Encounter: Payer: Self-pay | Admitting: Internal Medicine

## 2010-05-17 ENCOUNTER — Encounter: Payer: Self-pay | Admitting: Internal Medicine

## 2010-05-17 ENCOUNTER — Ambulatory Visit: Payer: Self-pay | Admitting: Internal Medicine

## 2010-05-17 LAB — CONVERTED CEMR LAB
Cholesterol: 137 mg/dL (ref 0–200)
Hgb A1c MFr Bld: 8.8 %
Triglycerides: 54 mg/dL (ref <150)
VLDL: 11 mg/dL (ref 0–40)

## 2010-05-20 ENCOUNTER — Telehealth: Payer: Self-pay | Admitting: Internal Medicine

## 2010-05-24 ENCOUNTER — Inpatient Hospital Stay (HOSPITAL_COMMUNITY): Admission: RE | Admit: 2010-05-24 | Discharge: 2010-05-29 | Payer: Self-pay | Admitting: Cardiology

## 2010-05-24 HISTORY — PX: CARDIAC CATHETERIZATION: SHX172

## 2010-05-27 ENCOUNTER — Encounter (INDEPENDENT_AMBULATORY_CARE_PROVIDER_SITE_OTHER): Payer: Self-pay | Admitting: Internal Medicine

## 2010-05-28 ENCOUNTER — Ambulatory Visit: Payer: Self-pay | Admitting: Vascular Surgery

## 2010-05-28 ENCOUNTER — Encounter (INDEPENDENT_AMBULATORY_CARE_PROVIDER_SITE_OTHER): Payer: Self-pay | Admitting: Cardiology

## 2010-05-28 HISTORY — PX: OTHER SURGICAL HISTORY: SHX169

## 2010-05-30 ENCOUNTER — Telehealth: Payer: Self-pay | Admitting: Internal Medicine

## 2010-06-03 ENCOUNTER — Telehealth: Payer: Self-pay | Admitting: Internal Medicine

## 2010-06-07 ENCOUNTER — Encounter: Payer: Self-pay | Admitting: Internal Medicine

## 2010-06-07 ENCOUNTER — Inpatient Hospital Stay (HOSPITAL_COMMUNITY): Admission: EM | Admit: 2010-06-07 | Discharge: 2010-06-16 | Payer: Self-pay | Admitting: Emergency Medicine

## 2010-06-07 ENCOUNTER — Ambulatory Visit: Payer: Self-pay | Admitting: Internal Medicine

## 2010-06-16 ENCOUNTER — Encounter: Payer: Self-pay | Admitting: Internal Medicine

## 2010-06-19 ENCOUNTER — Telehealth: Payer: Self-pay | Admitting: Internal Medicine

## 2010-06-20 ENCOUNTER — Telehealth: Payer: Self-pay | Admitting: Internal Medicine

## 2010-06-21 ENCOUNTER — Encounter: Payer: Self-pay | Admitting: Internal Medicine

## 2010-06-21 ENCOUNTER — Ambulatory Visit: Payer: Self-pay | Admitting: Internal Medicine

## 2010-06-21 DIAGNOSIS — E162 Hypoglycemia, unspecified: Secondary | ICD-10-CM

## 2010-06-24 LAB — CONVERTED CEMR LAB
Albumin: 2.6 g/dL — ABNORMAL LOW (ref 3.5–5.2)
BUN: 10 mg/dL (ref 6–23)
Calcium: 8.8 mg/dL (ref 8.4–10.5)
Chloride: 108 meq/L (ref 96–112)
Glucose, Bld: 122 mg/dL — ABNORMAL HIGH (ref 70–99)
Hemoglobin, Urine: NEGATIVE
INR: 2.28 — ABNORMAL HIGH (ref <1.50)
Nitrite: NEGATIVE
Potassium: 4.1 meq/L (ref 3.5–5.3)
Protein, ur: NEGATIVE mg/dL

## 2010-06-27 ENCOUNTER — Ambulatory Visit: Payer: Self-pay | Admitting: Internal Medicine

## 2010-06-27 DIAGNOSIS — R079 Chest pain, unspecified: Secondary | ICD-10-CM | POA: Insufficient documentation

## 2010-06-28 LAB — CONVERTED CEMR LAB
CO2: 26 meq/L (ref 19–32)
Glucose, Bld: 95 mg/dL (ref 70–99)
Potassium: 3.6 meq/L (ref 3.5–5.3)
Sodium: 143 meq/L (ref 135–145)

## 2010-07-01 ENCOUNTER — Encounter: Payer: Self-pay | Admitting: Internal Medicine

## 2010-07-02 ENCOUNTER — Ambulatory Visit: Payer: Self-pay | Admitting: Internal Medicine

## 2010-07-02 ENCOUNTER — Encounter: Payer: Self-pay | Admitting: Internal Medicine

## 2010-07-02 DIAGNOSIS — I6340 Cerebral infarction due to embolism of unspecified cerebral artery: Secondary | ICD-10-CM

## 2010-07-02 HISTORY — DX: Cerebral infarction due to embolism of unspecified cerebral artery: I63.40

## 2010-07-02 LAB — HM DIABETES FOOT EXAM

## 2010-07-02 LAB — CONVERTED CEMR LAB: Blood Glucose, Fingerstick: 215

## 2010-07-03 ENCOUNTER — Telehealth: Payer: Self-pay | Admitting: Internal Medicine

## 2010-07-04 LAB — CONVERTED CEMR LAB
Glucose, Bld: 210 mg/dL — ABNORMAL HIGH (ref 70–99)
Potassium: 4.5 meq/L (ref 3.5–5.3)
Sodium: 140 meq/L (ref 135–145)

## 2010-07-05 ENCOUNTER — Telehealth: Payer: Self-pay | Admitting: Internal Medicine

## 2010-07-08 ENCOUNTER — Ambulatory Visit: Payer: Self-pay | Admitting: Internal Medicine

## 2010-07-08 LAB — CONVERTED CEMR LAB

## 2010-07-15 ENCOUNTER — Ambulatory Visit: Payer: Self-pay | Admitting: Internal Medicine

## 2010-07-15 LAB — CONVERTED CEMR LAB: INR: 2.1

## 2010-07-22 ENCOUNTER — Ambulatory Visit: Payer: Self-pay | Admitting: Internal Medicine

## 2010-07-22 LAB — CONVERTED CEMR LAB: INR: 2.2

## 2010-08-05 ENCOUNTER — Ambulatory Visit: Payer: Self-pay | Admitting: Internal Medicine

## 2010-08-05 LAB — CONVERTED CEMR LAB

## 2010-08-09 ENCOUNTER — Ambulatory Visit: Payer: Self-pay | Admitting: Internal Medicine

## 2010-08-09 LAB — CONVERTED CEMR LAB
ALT: 13 units/L (ref 0–35)
BUN: 27 mg/dL — ABNORMAL HIGH (ref 6–23)
Blood Glucose, Fingerstick: 256
CO2: 22 meq/L (ref 19–32)
Creatinine, Ser: 1.57 mg/dL — ABNORMAL HIGH (ref 0.40–1.20)
Glucose, Bld: 261 mg/dL — ABNORMAL HIGH (ref 70–99)
Total Bilirubin: 0.4 mg/dL (ref 0.3–1.2)

## 2010-08-14 ENCOUNTER — Telehealth: Payer: Self-pay | Admitting: Internal Medicine

## 2010-08-16 ENCOUNTER — Telehealth: Payer: Self-pay | Admitting: Internal Medicine

## 2010-08-19 ENCOUNTER — Encounter: Payer: Self-pay | Admitting: Internal Medicine

## 2010-08-26 ENCOUNTER — Ambulatory Visit: Payer: Self-pay | Admitting: Internal Medicine

## 2010-08-26 LAB — CONVERTED CEMR LAB: INR: 2.1

## 2010-09-17 ENCOUNTER — Ambulatory Visit: Payer: Self-pay | Admitting: Internal Medicine

## 2010-09-17 LAB — CONVERTED CEMR LAB: INR: 3.3

## 2010-09-27 ENCOUNTER — Ambulatory Visit: Payer: Self-pay | Admitting: Internal Medicine

## 2010-09-27 DIAGNOSIS — J019 Acute sinusitis, unspecified: Secondary | ICD-10-CM | POA: Insufficient documentation

## 2010-09-27 LAB — CONVERTED CEMR LAB
Chloride: 101 meq/L (ref 96–112)
Potassium: 4.4 meq/L (ref 3.5–5.3)
Sodium: 135 meq/L (ref 135–145)

## 2010-10-14 ENCOUNTER — Ambulatory Visit: Payer: Self-pay | Admitting: Internal Medicine

## 2010-10-14 LAB — CONVERTED CEMR LAB: INR: 2.8

## 2010-11-02 DIAGNOSIS — Z7901 Long term (current) use of anticoagulants: Secondary | ICD-10-CM | POA: Insufficient documentation

## 2010-11-02 DIAGNOSIS — I6340 Cerebral infarction due to embolism of unspecified cerebral artery: Secondary | ICD-10-CM

## 2010-11-03 ENCOUNTER — Encounter: Payer: Self-pay | Admitting: *Deleted

## 2010-11-11 ENCOUNTER — Ambulatory Visit: Admission: RE | Admit: 2010-11-11 | Discharge: 2010-11-11 | Payer: Self-pay | Source: Home / Self Care

## 2010-11-11 LAB — CONVERTED CEMR LAB: INR: 2

## 2010-11-12 NOTE — Assessment & Plan Note (Signed)
Summary: COU/CH  Anticoagulant Therapy Referring MD: Landis Martins PCP: Nelda Bucks DO [facility] Attending: Coralee Pesa MD, Levada Schilling Indication 1: TIA/CVA Indication 2: Encounter for therapeutic drug monitoring  V58.83 Start date: 06/05/2010  Patient Assessment Reviewed by: Chancy Milroy PharmD  July 15, 2010 Medication review: verified warfarin dosage & schedule,verified previous prescription medications, verified doses & any changes, verified new medications, reviewed OTC medications, reviewed OTC health products-vitamins supplements etc Complications: none Dietary changes: none   Health status changes: none   Lifestyle changes: none   Recent/future hospitalizations: none   Recent/future procedures: none   Recent/future dental: none Patient Assessment Part 2:  Have you MISSED ANY DOSES or CHANGED TABLETS?  No missed Warfarin doses or changed tablets.  Have you had any BRUISING or BLEEDING ( nose or gum bleeds,blood in urine or stool)?  No reported bruising or bleeding in nose, gums, urine, stool.  Have you STARTED or STOPPED any MEDICATIONS, including OTC meds,herbals or supplements?  No other medications or herbal supplements were started or stopped.  Have you CHANGED your DIET, especially green vegetables,or ALCOHOL intake?  No changes in diet or alcohol intake.  Have you had any ILLNESSES or HOSPITALIZATIONS?  No reported illnesses or hospitalizations  Have you had any signs of CLOTTING?(chest discomfort,dizziness,shortness of breath,arms tingling,slurred speech,swelling or redness in leg)    No chest discomfort, dizziness, shortness of breath, tingling in arm, slurred speech, swelling, or redness in leg.     Treatment  Target INR: 2.0-3.0 INR: 2.1  Date: 07/15/2010 Regimen In:  35.0mg /week INR reflects regimen in: 2.1  New  Tablet strength: : 5mg  Regimen Out:     Sunday: 1 Tablet     Monday: 1 & 1/2 Tablet     Tuesday: 1 Tablet     Wednesday: 1 Tablet     Thursday: 1 &  1/2 Tablet      Friday: 1 Tablet     Saturday: 1 Tablet Total Weekly: 40.0mg /week mg  Next INR Due: 07/22/2010 Adjusted by: Barbera Setters. Alexandria Lodge III PharmD CACP   Return to anticoagulation clinic:  07/22/2010 Time of next visit: 0945    Allergies: 1)  ! * Baking Soda 2)  ! * Three 6's Tonic 3)  ! * Epson's Salt
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
## 2010-11-12 NOTE — Letter (Signed)
Summary: BLOOD GLUCOSE REPORT  BLOOD GLUCOSE REPORT   Imported By: Shon Hough 07/01/2010 15:09:13  _____________________________________________________________________  External Attachment:    Type:   Image     Comment:   External Document

## 2010-11-12 NOTE — Progress Notes (Signed)
Summary: med clarification/gp  Phone Note Refill Request Message from:  Fax from Pharmacy on August 14, 2010 11:19 AM  Refills Requested: Medication #1:  PRODIGY NO CODING BLOOD GLUC  STRP Use to test CBGs 3 times a day The pharmacy needs Qty.; the original rx has qty of 0.  Thanks   Method Requested: Electronic Initial call taken by: Chinita Pester RN,  August 14, 2010 11:19 AM  Follow-up for Phone Call        Rx e-submitted to pharmacy.  Rx for meter also sent to ensure pt is able to obtain new monitor Follow-up by: Nelda Bucks DO,  August 14, 2010 1:00 PM    Prescriptions: PRODIGY NO CODING BLOOD GLUC  STRP (GLUCOSE BLOOD) Use to test CBGs 3 times a day  #1 box x 12   Entered and Authorized by:   Nelda Bucks DO   Signed by:   Nelda Bucks DO on 08/14/2010   Method used:   Electronically to        [name] [id number] (retail)       [address] [place], [place]  [address]       Ph: [phone]       Fax: [phone]   RxID:   [id number] PRODIGY AUTOCODE BLOOD GLUCOSE W/DEVICE KIT (BLOOD GLUCOSE MONITORING SUPPL) Dispense one meter and testing kit  #1 x 0   Entered and Authorized by:   Nelda Bucks DO   Signed by:   Nelda Bucks DO on 08/14/2010   Method used:   Electronically to        [name] [address] (retail)       [address] [place], [place]  [address]       Ph: [phone]       Fax: [phone]   RxID:   [id number] PRODIGY AUTOCODE BLOOD GLUCOSE W/DEVICE KIT (BLOOD GLUCOSE MONITORING SUPPL) Dispense one meter and testing kit  #1 x 0   Entered and Authorized by:   Nelda Bucks DO   Signed by:   Nelda Bucks DO on 08/14/2010   Method used:   Telephoned to ...       [name] [address] (retail)       [address] [place], [place]  [address]       Ph: [phone]       Fax: [phone]   RxID:   [id number] PRODIGY NO CODING  BLOOD GLUC  STRP (GLUCOSE BLOOD) Use to test CBGs 3 times a day  #1 box x 12   Entered and Authorized by:   Nelda Bucks DO   Signed by:   Nelda Bucks DO on 08/14/2010   Method used:   Telephoned to ...       [name] [id number] (retail)       [address] [place], [place]  [address]       Ph: [phone]       Fax: [phone]   RxID:   [id number]

## 2010-11-12 NOTE — Progress Notes (Signed)
Summary: Refill/gh  Phone Note Refill Request Message from:  Fax from Pharmacy on May 20, 2010 11:50 AM  Refills Requested: Medication #1:  GUAIFENESIN-CODEINE 300-10 MG/5ML LIQD Take 5 mL by mouth every 4 hours as needed for cough. Only combunation available is 100-10mg /5 ml.  Is this ok to use ?   Method Requested: Fax to Local Pharmacy Initial call taken by: Angelina Ok RN,  May 20, 2010 11:51 AM  Follow-up for Phone Call        Yes, that would be fine.  New script prepared is needed, RN to complete. Follow-up by: Nelda Bucks DO,  May 20, 2010 12:01 PM    New/Updated Medications: CHERATUSSIN AC 100-10 MG/5ML SYRP (GUAIFENESIN-CODEINE) Take 5mL by mouth q6h as needed cough Prescriptions: CHERATUSSIN AC 100-10 MG/5ML SYRP (GUAIFENESIN-CODEINE) Take 5mL by mouth q6h as needed cough  #161mL x 0   Entered and Authorized by:   Nelda Bucks DO   Signed by:   Nelda Bucks DO on 05/20/2010   Method used:   Printed then faxed to ...       [facility] (retail)       [address]       [place], [place]  [address]       Ph: [phone]       Fax: [phone]   RxID:   [id number]

## 2010-11-12 NOTE — Assessment & Plan Note (Signed)
Summary: COU/CH  Anticoagulant Therapy Referring MD: Landis Martins PCP: Nelda Bucks DO [facility] Attending: Coralee Pesa MD, Levada Schilling Indication 1: TIA/CVA Indication 2: Encounter for therapeutic drug monitoring  V58.83 Start date: 06/05/2010  Patient Assessment Reviewed by: Chancy Milroy PharmD  September 17, 2010 Medication review: verified warfarin dosage & schedule,verified previous prescription medications, verified doses & any changes, verified new medications, reviewed OTC medications, reviewed OTC health products-vitamins supplements etc Complications: none Dietary changes: none   Health status changes: none   Lifestyle changes: none   Recent/future hospitalizations: none   Recent/future procedures: none   Recent/future dental: none Patient Assessment Part 2:  Have you MISSED ANY DOSES or CHANGED TABLETS?  No missed Warfarin doses or changed tablets.  Have you had any BRUISING or BLEEDING ( nose or gum bleeds,blood in urine or stool)?  No reported bruising or bleeding in nose, gums, urine, stool.  Have you STARTED or STOPPED any MEDICATIONS, including OTC meds,herbals or supplements?  No other medications or herbal supplements were started or stopped.  Have you CHANGED your DIET, especially green vegetables,or ALCOHOL intake?  No changes in diet or alcohol intake.  Have you had any ILLNESSES or HOSPITALIZATIONS?  No reported illnesses or hospitalizations  Have you had any signs of CLOTTING?(chest discomfort,dizziness,shortness of breath,arms tingling,slurred speech,swelling or redness in leg)    No chest discomfort, dizziness, shortness of breath, tingling in arm, slurred speech, swelling, or redness in leg.     Treatment  Target INR: 2.0-3.0 INR: 3.3  Date: 09/17/2010 Regimen In:  45.0mg /week INR reflects regimen in: 3.3  New  Tablet strength: : 5mg  Regimen Out:     Sunday: 1 Tablet     Monday: 1 & 1/2 Tablet     Tuesday: 1 Tablet     Wednesday: 1 Tablet     Thursday: 1 &  1/2 Tablet      Friday: 1 Tablet     Saturday: 1 Tablet Total Weekly: 40.0mg /week mg  Next INR Due: 10/14/2010 Adjusted by: Barbera Setters. Alexandria Lodge III PharmD CACP   Return to anticoagulation clinic:  10/14/2010 Time of next visit: 1030    Allergies: 1)  ! * Baking Soda 2)  ! * Three 6's Tonic 3)  ! * Epson's Salt

## 2010-11-12 NOTE — Progress Notes (Signed)
Summary: Refill/gh  Phone Note Refill Request Message from:  Fax from Pharmacy on November 12, 2009 3:55 PM  Refills Requested: Medication #1:  LASIX 40 MG TABS Take 3 tablets by mouth daily   Dosage confirmed as above?Dosage Confirmed   Supply Requested: 3 months   Last Refilled: 09/13/2009 Last office visit 08/06/2009.  Last labs 07/12/2009.   Method Requested: Electronic Initial call taken by: Angelina Ok RN,  November 12, 2009 3:56 PM  Follow-up for Phone Call        Please schedule follow-up appointment with Dr. Arvilla Market at her next available non-overbook appointment.  She needs re-evaluated before further refills can be prescribed to assure this is in her best interest.  I gave her enough refills for three months as she waits for the appointment with Dr. Arvilla Market.  Thank You. Follow-up by: Doneen Poisson MD,  November 12, 2009 5:25 PM    Prescriptions: LASIX 40 MG TABS (FUROSEMIDE) Take 3 tablets by mouth daily  #90 x 2   Entered and Authorized by:   Doneen Poisson MD   Signed by:   Doneen Poisson MD on 11/12/2009   Method used:   Electronically to        [facility] (retail)       [address]       [place], [place]  [address]       Ph: [phone]       Fax: [phone]   RxID:   [id number]   Appended Document: Refill/gh Unable to contact this patient for an appointment to be schuduled for her to follow up.  Both home numbers in the [employer] and [employer] are disconnected as well as her contact Cell phone number [phone] and her Contact info # [phone] is not working.  Hopefully she will call us sometime and update her information, so that we can reach her.

## 2010-11-12 NOTE — Progress Notes (Signed)
Summary: refill/gg  Phone Note Refill Request   Refills Requested: Medication #1:  AMBIEN 5 MG TABS Take 1 tablet by mouth at bedtime   Last Refilled: 07/12/2009  Method Requested: Fax to Local Pharmacy Initial call taken by: Merrie Roof RN,  June 19, 2010 12:19 PM  Follow-up for Phone Call        Refill approved-nurse to complete Follow-up by: Nelda Bucks DO,  June 19, 2010 12:32 PM    Prescriptions: AMBIEN 5 MG TABS (ZOLPIDEM TARTRATE) Take 1 tablet by mouth at bedtime  #30 x 5   Entered and Authorized by:   Nelda Bucks DO   Signed by:   Nelda Bucks DO on 06/19/2010   Method used:   Telephoned to ...       [facility] (retail)       [address]       [place], [place]  [address]       Ph: [phone]       Fax: [phone]   RxID:   [id number]   Appended Document: refill/gg Rx faxed in

## 2010-11-12 NOTE — Assessment & Plan Note (Signed)
Summary: F/U SOB, HTN, Dysuria   Vital Signs:  Patient profile:   77 year old female Height:      60 inches (152.40 cm) Weight:      178.7 pounds (81.23 kg) BMI:     35.03 O2 Sat:      99 % on Room air Temp:     98.4 degrees F (36.89 degrees C) oral Pulse rate:   68 / minute BP sitting:   170 / 70  (right arm) Cuff size:   RIGHT  Vitals Entered By: Theotis Barrio NT II (June 27, 2010 8:49 AM)  O2 Flow:  Room air CC: SOB   /  SWELLING IN FEET SINCE DC'D FROM HOSPITAL / REQUEST MACHINE IN HOUSE FOR BREATHING TREATMENT Is Patient Diabetic? Yes Did you bring your meter with you today? Yes Pain Assessment Patient in pain? no      Nutritional Status BMI of > 30 = obese  Have you ever been in a relationship where you felt threatened, hurt or afraid?No   Does patient need assistance? Functional Status Self care Ambulation Normal Comments WALKS WITH THE ASSIST OF A WALKER / FINGER STICK - METER DOWN LOAD DONE LAST VISIT(FRIDAY). PER DR.   Primary Care Provider:  Nelda Bucks DO  CC:  SOB   /  SWELLING IN FEET SINCE DC'D FROM HOSPITAL / REQUEST MACHINE IN HOUSE FOR BREATHING TREATMENT.  History of Present Illness: 77 yr old woman with PMH outlined in the EMR presents for 1 week follow-up of the following:  1) CHF: progressively worsening SOB occuring daily. Worse with activity and exertion, but does occasionally occurs at rest. Resting usually remedies SOB. During exertion, develops occasional "sore spot on chest" that can last between a few seconds to a few hours at a time, and is also relieved by rest with occasional associated tachycardia. Happened 2-3 times last week. Swelling in feet about same as last week, but still more than baseline.  Confirms productive cough with green sputum production x2 days. Denies fever, chills.   2) HTN: pts BP remains poorly controlled.  Patient still taking Clonidine. She states she is taking all of her medicines as directed and denies h/a,  visual changes, c/p, or other complaint.  3)  Dysuria- no malodor, dysuria occuring very occasionally less than once weekly, and denies fevers, chills, hematuria. No burning or itching in vaginal area.      Preventive Screening-Counseling & Management  Alcohol-Tobacco     Alcohol drinks/day: 0     Smoking Status: never  Caffeine-Diet-Exercise     Does Patient Exercise: yes     Type of exercise: walking     Times/week: 3  Current Medications (verified): 1)  Loratadine 10 Mg Tabs (Loratadine) .... Take 1 Tablet By Mouth Once A Day 2)  Proventil Hfa 108 (90 Base) Mcg/act Aers (Albuterol Sulfate) .... Inhale 2 Puffs Every 4 Hours As Needed For Wheezing or Shortness of Breath. 3)  Oscal 500/200 D-3 500-200 Mg-Unit Tabs (Calcium-Vitamin D) .... Take 1 Tablet By Mouth Three Times A Day 4)  Ranitidine Hcl 75 Mg  Tabs (Ranitidine Hcl) .... Take 1 Tablet By Mouth Once A Day As Needed 5)  Imdur 60 Mg Tb24 (Isosorbide Mononitrate) .... Take 1 Tablet By Mouth Once A Day 6)  Bayer Low Strength 81 Mg Tbec (Aspirin) .... Take 1 Tablet By Mouth Once A Day 7)  Fosamax 35 Mg Tabs (Alendronate Sodium) .... Take 1 Tablet By Mouth Once A Week. 8)  Serevent Diskus 50 Mcg/dose Aepb (Salmeterol Xinafoate) .... Inhale Contents of 1 Capsule Once A Day 9)  Nitroglycerin 0.4 Mg Subl (Nitroglycerin) .... Take 1 Tablet Sublingual As Needed 10)  Senokot 8.6 Mg  Tabs (Sennosides) .... Take 1-2  Tablets By Mouth Once A Day 11)  Bd Insulin Syringe Ultrafine 31g X 5/16" 0.5 Ml Misc (Insulin Syringe-Needle U-100) .... Use Syringe To Inject Insulin Three Times A Day As Instructed. 12)  Carvedilol 6.25 Mg Tabs (Carvedilol) .... Take 1 Tablet By Mouth Two Times A Day 13)  Flunisolide 0.025 % Soln (Flunisolide) .... Two Sprays Per Nostril Two Times A Day. 14)  Lantus Solostar 100 Unit/ml Soln (Insulin Glargine) .... Inject 20 Units Into Skin Every Night 15)  Ambien 5 Mg Tabs (Zolpidem Tartrate) .... Take 1 Tablet By Mouth  At Bedtime 16)  Crestor 20 Mg Tabs (Rosuvastatin Calcium) .... Take One Tablet By Mouth Daily 17)  Prodigy Blood Glucose Monitor  Devi (Blood Glucose Monitoring Suppl) .... Use To Check Blood Sugar Twice Daily 18)  Prodigy Autocode Blood Glucose  Strp (Glucose Blood) .... Use To Check Blood Sugar Twice Daily 19)  Prodigy Twist Top Lancets 28g  Misc (Lancets) .... Use To Check Blood Sugar Twice Daily 20)  Hydralazine Hcl 25 Mg Tabs (Hydralazine Hcl) .... Take 1 Tablet By Mouth Three Times A Day 21)  Warfarin Sodium 5 Mg Tabs (Warfarin Sodium) .... Take 1/2 To 1 Tab As Directed By The Pharmacy  Allergies (verified): 1)  ! * Baking Soda 2)  ! * Three 6's Tonic 3)  ! * Epson's Salt  Review of Systems      See HPI  Physical Exam  General:  Well-developed,well-nourished,in no acute distress; alert,appropriate and cooperative throughout examination Head:  Normocephalic and atraumatic without obvious abnormalities. No apparent alopecia or balding. Mouth:  pharynx pink and moist, no erythema, and no exudates.   Neck:  No deformities, masses, or tenderness noted.no JVD.   Lungs:  Normal respiratory effort, chest expands symmetrically. Lungs are clear to auscultation, no crackles or wheezes. Heart:  Normal rate and regular rhythm. S1 and S2 normal without gallop, murmur, click, rub or other extra sounds. Abdomen:  Bowel sounds positive,abdomen soft and non-tender without masses, organomegaly or hernias noted. Extremities:  1-2+ pretibial edema BL   Impression & Recommendations:  Problem # 1:  CONGESTIVE HEART FAILURE (ICD-428.0) Still with persistent SOB since discontinuing her Lasix (during her hospitalization 2/2 declining renal function), although no significant increase in weight appreciated. O2 saturation 99% on RA. Chest pain described likely not secondary to acute cardiac pathology - thought to be secondary to CHF. Had cardiac cath August 2011 showing nononstructive coronary artery  disease, systolic heart failure with global hypokinesis and EF of 20-30%.  - Will check BMET today to assess renal function - Will consider resumption of Lasix if BMET reflects improved renal function - Recommended continuation of Albuterol for SOB as needed, as likely will improve with resumption of Lasix (no O2 supplementation needed at this time)  Her updated medication list for this problem includes:    Bayer Low Strength 81 Mg Tbec (Aspirin) .Marland Kitchen... Take 1 tablet by mouth once a day    Carvedilol 6.25 Mg Tabs (Carvedilol) .Marland Kitchen... Take 1 tablet by mouth two times a day    Warfarin Sodium 5 Mg Tabs (Warfarin sodium) .Marland Kitchen... Take 1/2 pill mon, wed,and fri and 1 full pill tues, thurs,sat and sun  Problem # 2:  HYPERTENSION (ICD-401.9) Blood pressure still  persisently elevated. Currently on Imdur, Carvedilol, Hydralazine. - Will not add additional med at this time, as are expecting to restart Lasix if improvement of renal funciton to baseline noted on BMET. - Will reassess in 1 week and determine if futher medications need to be added (will depend on if lasix is restarted)  Her updated medication list for this problem includes:    Carvedilol 6.25 Mg Tabs (Carvedilol) .Marland Kitchen... Take 1 tablet by mouth two times a day    Hydralazine Hcl 25 Mg Tabs (Hydralazine hcl) .Marland Kitchen... Take 1 tablet by mouth three times a day    Clonidine Hcl 0.1 Mg/24hr Ptwk (Clonidine hcl) .Marland Kitchen... Apply one patch to skin every week    Amlodipine Besylate 5 Mg Tabs (Amlodipine besylate) .Marland Kitchen... Take 1 tablet by mouth once a day  Problem # 3:  DYSURIA (ICD-788.1) Assessment: Improved Very occasional at this time point, also recent urine culture only grew <2000 colonies of bacteria.  - Antibiotics not indicated at this time, will reassess next visit if progression of symptoms occurs.  Complete Medication List: 1)  Loratadine 10 Mg Tabs (Loratadine) .... Take 1 tablet by mouth once a day 2)  Proventil Hfa 108 (90 Base) Mcg/act Aers  (Albuterol sulfate) .... Inhale 2 puffs every 4 hours as needed for wheezing or shortness of breath. 3)  Oscal 500/200 D-3 500-200 Mg-unit Tabs (Calcium-vitamin d) .... Take 1 tablet by mouth three times a day 4)  Ranitidine Hcl 75 Mg Tabs (Ranitidine hcl) .... Take 1 tablet by mouth once a day as needed 5)  Imdur 60 Mg Tb24 (Isosorbide mononitrate) .... Take 1 tablet by mouth once a day 6)  Bayer Low Strength 81 Mg Tbec (Aspirin) .... Take 1 tablet by mouth once a day 7)  Fosamax 35 Mg Tabs (Alendronate sodium) .... Take 1 tablet by mouth once a week. 8)  Serevent Diskus 50 Mcg/dose Aepb (Salmeterol xinafoate) .... Inhale contents of 1 capsule once a day 9)  Nitroglycerin 0.4 Mg Subl (Nitroglycerin) .... Take 1 tablet sublingual as needed 10)  Senokot 8.6 Mg Tabs (Sennosides) .... Take 1-2  tablets by mouth once a day 11)  Bd Insulin Syringe Ultrafine 31g X 5/16" 0.5 Ml Misc (Insulin syringe-needle u-100) .... Use syringe to inject insulin three times a day as instructed. 12)  Carvedilol 6.25 Mg Tabs (Carvedilol) .... Take 1 tablet by mouth two times a day 13)  Flunisolide 0.025 % Soln (Flunisolide) .... Two sprays per nostril two times a day. 14)  Lantus Solostar 100 Unit/ml Soln (Insulin glargine) .... Inject 10 units into skin every night 15)  Crestor 20 Mg Tabs (Rosuvastatin calcium) .... Take one tablet by mouth daily 16)  Prodigy Blood Glucose Monitor Devi (Blood glucose monitoring suppl) .... Use to check blood sugar twice daily 17)  Prodigy Autocode Blood Glucose Strp (Glucose blood) .... Use to check blood sugar twice daily 18)  Prodigy Twist Top Lancets 28g Misc (Lancets) .... Use to check blood sugar twice daily 19)  Hydralazine Hcl 25 Mg Tabs (Hydralazine hcl) .... Take 1 tablet by mouth three times a day 20)  Warfarin Sodium 5 Mg Tabs (Warfarin sodium) .... Take 1/2 pill mon, wed,and fri and 1 full pill tues, thurs,sat and sun 21)  Onetouch Ultra Blue Strp (Glucose blood) .... Use to  check blood sugar twice a dauy 22)  Clonidine Hcl 0.1 Mg/24hr Ptwk (Clonidine hcl) .... Apply one patch to skin every week 23)  Amlodipine Besylate 5 Mg Tabs (Amlodipine besylate) .Marland KitchenMarland KitchenMarland Kitchen  Take 1 tablet by mouth once a day  Other Orders: T-Basic Metabolic Panel [phone])  Patient Instructions: 1)  Please follow-up with Dr. Arvilla Market next week (before 07/05/10) at which time she can assess if your breathing has improved, and can change your blood pressure medications if your blood pressure is still high. Also discuss with her if your cough is getting worse.  2)  We are checking labs today to assess your kidney function - if it is improving, we plan to restart your Lasix, which should help with your shortness of breath- we will call you with the results and if your Lasix can be restarted - Please wait for our call. 3)  Please increase your Proventil to 2 puffs every 4 hours as needed for shortness of breath. We expect that once your Lasix is resumed, it should help with this acute shortness of breath you are having. Process Orders Check Orders Results:     [facility]: Check successful Tests Sent for requisitioning (July 01, 2010 11:44 AM):     06/27/2010: [facility] -- T-Basic Metabolic Panel [phone] (signed)     Prevention & Chronic Care Immunizations   Influenza vaccine: Fluvax 3+  (07/12/2009)    Tetanus booster: Not documented    Pneumococcal vaccine: Historical  (07/25/2006)    H. zoster vaccine: Not documented  Colorectal Screening   Hemoccult: Negative  (07/13/2002)    Colonoscopy: Not documented  Other Screening   Pap smear: Normal  (11/14/2001)    Mammogram: ASSESSMENT: Negative - BI-RADS 1^MM DIGITAL SCREENING  (12/28/2009)   Mammogram action/deferral: Screening mammogram in 1 year.     (12/22/2008)    DXA bone density scan: Not documented   Smoking status: never  (06/27/2010)  Diabetes Mellitus   HgbA1C: 8.8   (05/17/2010)   HgbA1C action/deferral: Ordered  (05/17/2010)    Eye exam: + diabetic retinopathy, stable.  (07/20/2009)   Diabetic eye exam action/deferral: Ophthalmology referral  (07/12/2009)   Eye exam due: 01/18/2010    Foot exam: yes  (05/17/2010)   Foot exam action/deferral: Do today   High risk foot: Not documented   Foot care education: Done  (05/17/2010)    Urine microalbumin/creatinine ratio: 201.7  (07/12/2009)   Urine microalbumin action/deferral: Ordered  Lipids   Total Cholesterol: 137  (05/17/2010)   Lipid panel action/deferral: Lipid Panel ordered   LDL: 64  (05/17/2010)   LDL Direct: Not documented   HDL: 62  (05/17/2010)   Triglycerides: 54  (05/17/2010)    SGOT (AST): 21  (06/21/2010)   BMP action: Ordered   SGPT (ALT): 12  (06/21/2010)   Alkaline phosphatase: 80  (06/21/2010)   Total bilirubin: 0.3  (06/21/2010)  Hypertension   Last Blood Pressure: 170 / 70  (06/27/2010)   Serum creatinine: 1.69  (06/21/2010)   BMP action: Ordered   Serum potassium 4.1  (06/21/2010)  Self-Management Support :   Personal Goals (by the next clinic visit) :     Personal A1C goal: 7  (07/12/2009)     Personal blood pressure goal: 140/90  (07/12/2009)     Personal LDL goal: 70  (07/12/2009)    Patient will work on the following items until the next clinic visit to reach self-care goals:     Medications and monitoring: take my medicines every day, check my blood sugar, bring all of my medications to every visit, examine my feet every day  (06/27/2010)     Eating: drink diet soda or water instead  of juice or soda, eat more vegetables, use fresh or frozen vegetables, eat foods that are low in salt, eat baked foods instead of fried foods, eat fruit for snacks and desserts, limit or avoid alcohol  (06/27/2010)     Other: fluid restrictions by cardiology - not exercising -trying to lift arms and feet  (05/17/2010)    Diabetes self-management support: Resources for patients  handout  (06/27/2010)   Last diabetes self-management training by diabetes educator: 03/07/2009    Hypertension self-management support: Resources for patients handout  (06/27/2010)    Lipid self-management support: Resources for patients handout  (06/27/2010)     Self-management comments: PATIENT WALKS WITH ASSIST OF A WALKER      Resource handout printed.

## 2010-11-12 NOTE — Progress Notes (Signed)
Summary: Refill-call to pt  Phone Note Outgoing Call   Call placed by: Angelina Ok RN,  March 18, 2010 10:24 AM Summary of Call: Call to pt to inform her of the  need for an appontment before getting further refills on her Lasix.  Message left for pt to call the Clinics.   Angelina Ok RN  March 18, 2010 10:28 AM  Initial call taken by: Angelina Ok RN,  March 18, 2010 10:29 AM

## 2010-11-12 NOTE — Miscellaneous (Signed)
Summary: [facility]  [facility]   Imported By: Margie Billet 07/03/2010 16:40:36  _____________________________________________________________________  External Attachment:    Type:   Image     Comment:   External Document

## 2010-11-12 NOTE — Progress Notes (Signed)
Summary: Refill/gh  Phone Note Refill Request Message from:  Fax from Pharmacy on Feb 22, 2010 12:30 PM  Refills Requested: Medication #1:  BD INSULIN SYRINGE ULTRAFINE 31G X 5/16" 0.5 ML MISC Use syringe to inject insulin three times a day as instructed.   Last Refilled: 11/08/2009  Method Requested: Electronic Initial call taken by: Angelina Ok RN,  Feb 22, 2010 12:31 PM  Follow-up for Phone Call        Rx electronically submitted  to pharmacy Follow-up by: Nelda Bucks DO,  Feb 22, 2010 4:22 PM    Prescriptions: BD INSULIN SYRINGE ULTRAFINE 31G X 5/16" 0.5 ML MISC (INSULIN SYRINGE-NEEDLE U-100) Use syringe to inject insulin three times a day as instructed.  #100 x 11   Entered and Authorized by:   Nelda Bucks DO   Signed by:   Nelda Bucks DO on 02/22/2010   Method used:   Electronically to        [facility] (retail)       [address]       [place], [place]  [address]       Ph: [phone]       Fax: [phone]   RxID:   [id number]
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
## 2010-11-12 NOTE — Progress Notes (Signed)
Summary: refill/gg  Phone Note Refill Request  on February 04, 2010 4:10 PM  Refills Requested: Medication #1:  LANTUS SOLOSTAR 100 UNIT/ML SOLN Inject 34 units into skin every night *****Pt's insurance will not cover pens. please refill vials   Method Requested: Fax to Local Pharmacy Initial call taken by: Merrie Roof RN,  February 04, 2010 4:10 PM  Follow-up for Phone Call        Rx faxed to pharmacy Follow-up by: Nelda Bucks DO,  February 05, 2010 8:28 AM    Prescriptions: LANTUS SOLOSTAR 100 UNIT/ML SOLN (INSULIN GLARGINE) Inject 34 units into skin every night  #1 vial x 6   Entered and Authorized by:   Nelda Bucks DO   Signed by:   Nelda Bucks DO on 02/05/2010   Method used:   Faxed to ...       [facility] (retail)       [address]       [place], [place]  [address]       Ph: [phone]       Fax: [phone]   RxID:   [id number]

## 2010-11-12 NOTE — Discharge Summary (Signed)
Summary: Hospital Discharge Update    Hospital Discharge Update:  Date of Admission: 04/30/2010 Date of Discharge: 05/01/2010  Brief Summary:  [name]  is a 77 year old female with a past medical history significant for CHF (Last ef 40-50% 9/09), CAD, and type two DM who was admitted for shortness of breath.  After admission, she was given diuresis and BP control, her SOB has significantly improved and back to her normal baseline, ambulates well or RA. Her EF 20-25% during this admission. She is discharged today in stable and improved condition.  Labs needed at follow-up: CBC with differential, Basic metabolic panel  Other follow-up issues:  She will have an appointment with Dr. Clarene Duke on 8/3/2011at 11:00AM. She will have an appointment with Dr. Arvilla Market on 05/17/10 at 2:00PM.  Medication list changes:  Changed medication from LASIX 40 MG TABS (FUROSEMIDE) Take 3 tablets by mouth daily to LASIX 40 MG TABS (FUROSEMIDE) Take 1 tablet by mouth two times a day - Signed Removed medication of DILTIAZEM HCL 60 MG TABS (DILTIAZEM HCL) Take 2 tablets by mouth two times a day Changed medication from PRODIGY BLOOD GLUCOSE MONITOR  DEVI (BLOOD GLUCOSE MONITORING SUPPL) use to check blood sugar twice daily to PRODIGY BLOOD GLUCOSE MONITOR  DEVI (BLOOD GLUCOSE MONITORING SUPPL) use to check blood sugar twice daily - Signed Changed medication from LISINOPRIL 40 MG TABS (LISINOPRIL) Take 1 tablet by mouth once a day to LISINOPRIL 40 MG TABS (LISINOPRIL) Take 1 tablet by mouth once a day - Signed Removed medication of NAPROXEN 500 MG TABS (NAPROXEN) Take 1 tablet by mouth two times a day with food. Changed medication from CARVEDILOL 3.125 MG TABS (CARVEDILOL) Take 1 tablet by mouth two times a day to CARVEDILOL 3.125 MG TABS (CARVEDILOL) Take 1 tablet by mouth two times a day - Signed Changed medication from PRODIGY BLOOD GLUCOSE MONITOR  DEVI (BLOOD GLUCOSE MONITORING SUPPL) use to check blood sugar twice daily  to PRODIGY BLOOD GLUCOSE MONITOR  DEVI (BLOOD GLUCOSE MONITORING SUPPL) use to check blood sugar twice daily - Signed Rx of LASIX 40 MG TABS (FUROSEMIDE) Take 1 tablet by mouth two times a day;  #60 x 2;  Signed;  Entered by: Jackson Latino MD;  Authorized by: Jackson Latino MD;  Method used: Electronically to [facility] [address] [place], [place], [place]  [address], Ph: [phone], Fax: [phone] Rx of PRODIGY BLOOD GLUCOSE MONITOR  DEVI (BLOOD GLUCOSE MONITORING SUPPL) use to check blood sugar twice daily;  #1 x 0;  Signed;  Entered by: Jackson Latino MD;  Authorized by: Jackson Latino MD;  Method used: Electronically to [facility] [address] [place], [place], [place]  [address], Ph: [phone], Fax: [phone] Rx of LISINOPRIL 40 MG TABS (LISINOPRIL) Take 1 tablet by mouth once a day;  #30 x 3;  Signed;  Entered by: Jackson Latino MD;  Authorized by: Jackson Latino MD;  Method used: Electronically to [facility] [address], [place], [place], [place]  [address], Ph: [phone], Fax: [phone] Rx of CARVEDILOL 3.125 MG TABS (CARVEDILOL) Take 1 tablet by mouth two times a day;  #30 x 3;  Signed;  Entered by: Jackson Latino MD;  Authorized by: Jackson Latino MD;  Method used: Faxed to [facility], [address] [place], [place], [place]  [address], Ph: [phone], Fax: [phone] Rx of LASIX 40 MG TABS (FUROSEMIDE) Take 1 tablet by mouth two times a  day;  #60 x 2;  Signed;  Entered by: Jackson Latino MD;  Authorized by: Jackson Latino MD;  Method used: Faxed to [facility], [address] [place], [place], [place]  [address], Ph: [phone], Fax: [phone] Rx of LISINOPRIL 40 MG TABS (LISINOPRIL) Take 1 tablet by mouth once a day;  #30 x 3;  Signed;  Entered by: Jackson Latino MD;  Authorized by: Jackson Latino MD;  Method used: Faxed to [facility], [address] [place], [place], [place]  [address], Ph: [phone], Fax: [phone] Rx of PRODIGY BLOOD GLUCOSE MONITOR  DEVI (BLOOD GLUCOSE MONITORING SUPPL) use to check blood sugar twice daily;  #1 x 1;  Signed;  Entered by: Jackson Latino MD;  Authorized by: Jackson Latino MD;  Method used: Faxed to [facility], [address] [place], [place], [place]  [address], Ph: [phone], Fax: [phone]  The medication, problem, and allergy lists have been updated.  Please see the dictated discharge summary for details.  Discharge medications:  LORATADINE 10 MG TABS (LORATADINE) Take 1 tablet by mouth once a day PROVENTIL HFA 108 (90 BASE) MCG/ACT AERS (ALBUTEROL SULFATE) Inhale 2 puffs every 4 hours as needed for wheezing or shortness of breath. OSCAL 500/200 D-3 500-200 MG-UNIT TABS (CALCIUM-VITAMIN D) Take 1 tablet by mouth three times a day KLOR-CON 20 MEQ PACK (POTASSIUM CHLORIDE) Take 1 tablet by mouth once a day RANITIDINE HCL 75 MG  TABS (RANITIDINE HCL) Take 1 tablet by mouth once a day as needed IMDUR 60 MG TB24 (ISOSORBIDE MONONITRATE) Take 1 tablet by mouth once a day LASIX 40 MG TABS (FUROSEMIDE) Take 1 tablet by mouth two times a day LIPITOR 40 MG TABS (ATORVASTATIN CALCIUM) Take 1 tablet by mouth at bedtime BAYER LOW STRENGTH 81 MG TBEC (ASPIRIN) Take 1 tablet by mouth once a day FOSAMAX 35 MG TABS (ALENDRONATE SODIUM) Take 1 tablet by mouth once a week. LISINOPRIL 40 MG TABS (LISINOPRIL) Take 1 tablet by mouth once a day SEREVENT DISKUS 50 MCG/DOSE AEPB (SALMETEROL XINAFOATE) Inhale contents of 1 capsule once a day NITROGLYCERIN 0.4 MG SUBL (NITROGLYCERIN) take 1 tablet sublingual as needed METFORMIN HCL 500 MG  TABS (METFORMIN HCL) Take 1 tablet by mouth two times a day SENOKOT 8.6 MG  TABS (SENNOSIDES) Take 1-2  tablets by mouth once a day BD INSULIN SYRINGE ULTRAFINE 31G X 5/16" 0.5 ML MISC (INSULIN SYRINGE-NEEDLE U-100) Use syringe to inject insulin three times a day as  instructed. CARVEDILOL 3.125 MG TABS (CARVEDILOL) Take 1 tablet by mouth two times a day FLUNISOLIDE 0.025 % SOLN (FLUNISOLIDE) Two sprays per nostril two times a day. GUAIFENESIN-CODEINE 300-10 MG/5ML LIQD (GUAIFENESIN-CODEINE) Take 5 mL by mouth every 4 hours as needed for cough. ONETOUCH ULTRA TEST  STRP (GLUCOSE BLOOD) use to check blood sugar three times dialy before meals and bedtime LANCETS  MISC (LANCETS) use to check blood sugar three times daily LANTUS SOLOSTAR 100 UNIT/ML SOLN (INSULIN GLARGINE) Inject 34 units into skin every night AMBIEN 5 MG TABS (ZOLPIDEM TARTRATE) Take 1 tablet by mouth at bedtime TRAMADOL HCL 50 MG TABS (TRAMADOL HCL) Take 1 tablet by mouth every 6 hours as needed for pain CLONIDINE HCL 0.1 MG/24HR PTWK (CLONIDINE HCL) Place one patch to skin every week CRESTOR 20 MG TABS (ROSUVASTATIN CALCIUM) Take one tablet by mouth daily PRODIGY BLOOD GLUCOSE MONITOR  DEVI (BLOOD GLUCOSE MONITORING SUPPL) use to check blood sugar twice daily PRODIGY AUTOCODE BLOOD  GLUCOSE  STRP (GLUCOSE BLOOD) use to check blood sugar twice daily PRODIGY TWIST TOP LANCETS 28G  MISC (LANCETS) use to check blood sugar twice daily  Other patient instructions:  You will have an appointment with Dr. Clarene Duke on 8/3/2011at 11:00AM. You will have an appointment with Dr. Arvilla Market on 05/17/10 at 2:00PM. If you gain more than 3 lbs, please call Dr. Clarene Duke.  Note: Hospital Discharge Medications & Other Instructions handout was printed, one copy for patient and a second copy to be placed in hospital chart.

## 2010-11-12 NOTE — Assessment & Plan Note (Signed)
Summary: EST-F/U VISIT PER [facility]   Vital Signs:  Patient profile:   77 year old female Height:      60 inches (152.40 cm) Weight:      174.1 pounds (79.14 kg) BMI:     34.12 O2 Sat:      100 % on Room air Temp:     97.4 degrees F (36.33 degrees C) oral Pulse rate:   78 / minute BP sitting:   166 / 67  (right arm)  Vitals Entered By: Stanton Kidney Ditzler RN (July 02, 2010 2:32 PM)  O2 Flow:  Room air Is Patient Diabetic? Yes Did you bring your meter with you today? Yes Pain Assessment Patient in pain? no      Nutritional Status BMI of > 30 = obese Nutritional Status Detail appetite good CBG Result 215  Have you ever been in a relationship where you felt threatened, hurt or afraid?denies   Does patient need assistance? Functional Status Self care Ambulation Impaired:Risk for fall Comments Uses a walker. FU - needs strips for One Touch Ultra 2. Confused with meds - which ones to take. Feet swollen since off Lasix. SOB - better today.   Primary Care Provider:  Nelda Bucks DO   History of Present Illness: 77 yr old woman with PMH outlined in the EMR presents for hospital  follow up for AMS,A/CRI,and UTI. 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   Pt is feeling well after hospital discharge but notes feeling sightly more sob than usual.  Pt states she feels cold alot; no temp taken at home;no fever but reports sweats and shaking chills.  Daughter states pt has not had any altered mental status and is not more sob than bef  1: DM2: pt using Lantus 20u at night and is not taking metformin; this was d/c'd during her hospitalization 2/2 AKI.   She is having occasional hypoglycemic events,mostly at night.  When this happens, she drinks OJ,tea, or eats raisins to bring up her blood sugar; she rechecks her CBG after to ensure it returns to normal.  She states that sometimes she is hesistant to recheck her blood sugar b/c she is  worried she will run out of strips.  2:  Pt reports dysuria from d/c home. ALso reports increased urgency and hesitancy. 2. CHF: pt feels SOB is a little better.  She is still having DOE but feels that this is also better than it was. She denies any worsening cough, wheezing, fever, chills, H/A, syncope, or chest pain.  SHe reports some increased edema but keeping her legs elevated helps significantly with her LE edema.  3. HTN: pts BP remains poorly controlled.  Review of her BP readings during her hospital stay reveal she was well controlled on her current regimen while she was in the hospital.  She states she is taking all of her medicines as directed and denies h/a, visual changes, c/p, or other complaint.  She reports her home BPs have improved from 180 over the past few days,but she cannot recall the numbers.   She needs a refill of her coreg and NTG and crestor  4. Dysuria: this an completely resolved. She has no other concerns or complaints today.  Depression History:      The patient denies a depressed mood most of the day and a diminished interest in her usual daily activities.         Preventive Screening-Counseling & Management  Alcohol-Tobacco     Alcohol drinks/day: 0     Smoking Status: never  Caffeine-Diet-Exercise     Does Patient Exercise: yes     Type of exercise: walking     Times/week: 3  Allergies: 1)  ! * Baking Soda 2)  ! * Three 6's Tonic 3)  ! * Epson's Salt  Review of Systems      See HPI  Physical Exam  General:  Well-developed,well-nourished,in no acute distress; alert,appropriate and cooperative throughout examination Head:  Normocephalic and atraumatic without obvious abnormalities. No apparent alopecia or balding. Eyes:  Wearing glasses. PERRLA, anicteric, no injection. vision grossly intact.   Mouth:  pharynx pink and moist, no erythema, and no exudates.   Neck:  No deformities, masses, or tenderness noted.no JVD.   Lungs:  Normal respiratory  effort, chest expands symmetrically. Lungs are clear to auscultation, no crackles or wheezes. Heart:  Normal rate and regular rhythm. S1 and S2 normal without gallop, murmur, click, rub or other extra sounds. Abdomen:  Bowel sounds positive,abdomen soft and non-tender without masses, organomegaly or hernias noted. Extremities:  1-2+ edema noted in bilaterallower extremities. Neurologic:  alert & oriented X3, cranial nerves II-XII intact, strength normal in all extremities, and sensation intact to light touch. Skin:  turgor normal, color normal, and no rashes.   Psych:  Oriented X3, memory intact for recent and remote, normally interactive, good eye contact, not anxious appearing, and not depressed appearing.    Diabetes Management Exam:    Foot Exam (with socks and/or shoes not present):       Sensory-Monofilament:          Left foot: normal          Right foot: normal   Impression & Recommendations:  Problem # 1:  HYPOGLYCEMIA (ICD-251.2) Pt is experiencing relatively frequent episodes of hypoglyemia with CBGs of 60-80.  Will continue to hold her metoformin as her renal insufficiency has not resolved.  Will decrease her Lantus to 10u Subcutaneously daily and have her regularly check fasting, pre and post prandial CBGs over the next few weeks.  Will review her CBG log and make further changes to her anti-hyperglycemic regimen at that time.  Will provide a script to ensure she has sufficient testing supplies.  Problem # 2:  HYPERTENSION (ICD-401.9) Pts BP is still poorly controlled.  SHe is on a number of medicines and is confused about which medications were d/cd and which she should currently be taking.  Will try to simplify her regimen as follows: - WIll d/c hydralazine to eliminate t.i.d dosing - Will increase coreg from 6.25 to 12.5 b.i.d. - WIll maximaze norvasc to 10mg  q.d. - WIll continue clonidine at current dosing -may d/c or increase this in the furture - WIll contine Imdur - may  increase dosing in the future - WIll continue to hold lasix as pts cr has not normalized  -Will continue lisinopril 40mg  q.d. - pt has been taking this regularly since hospital d/c.  During this time, her Cr has slowly improved and she will benefit from tx with ace given her DM2   Will check a bmet today to eval renal function. The following medications were removed from the medication list:    Hydralazine Hcl 25 Mg Tabs (Hydralazine hcl) .Marland Kitchen... Take 1 tablet by mouth three times a day Her updated medication list for this problem includes:    Carvedilol 12.5 Mg Tabs (Carvedilol) .Marland Kitchen... Take 1 tablet by mouth two times a day  Clonidine Hcl 0.1 Mg/24hr Ptwk (Clonidine hcl) .Marland Kitchen... Apply one patch to skin every week    Amlodipine Besylate 10 Mg Tabs (Amlodipine besylate) .Marland Kitchen... Take 1 tablet by mouth once a day    Lisinopril 40 Mg Tabs (Lisinopril) .Marland Kitchen... Take 1 tablet by mouth once a day  Orders: T-Basic Metabolic Panel ([id number])  Problem # 3:  RENAL FAILURE, ACUTE, HX OF (ICD-V13.09) Pts Cr has still not returned to baseline.  WIll repeat bmet today and at next visit.  Wil continue to hold metformin and lasix.  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  SHe reports taking the medication daily as directed.  WIll check PT/INR today and ensure she has follow up with Dr. Alexandria Lodge asap for continued management.  Her updated medication list for this problem includes:    Bayer Low Strength 81 Mg Tbec (Aspirin) .Marland Kitchen... Take 1 tablet by mouth once a day    Warfarin Sodium 5 Mg Tabs (Warfarin sodium) .Marland Kitchen... Take 1/2 pill mon, wed,and fri and 1 full pill tues, thurs,sat and sun  Problem # 5:  CONGESTIVE HEART FAILURE (ICD-428.0) Pt continue to do well off of Lasix.  She is without signs of increasing fluid overload.  Will continue to hold lasix until her Cr returns to normal and monitor her closely.   Pt advised to rtc or go to the er if she develops sob,worsening doe,significant weight gain/edema, c/p or other concerning symptom.  Her updated medication list for this problem includes:    Bayer Low Strength 81 Mg Tbec (Aspirin) .Marland Kitchen... Take 1 tablet by mouth once a day    Carvedilol 12.5 Mg Tabs (Carvedilol) .Marland Kitchen... Take 1 tablet by mouth two times a day    Warfarin Sodium 5 Mg Tabs (Warfarin sodium) .Marland Kitchen... Take 1/2 pill mon, wed,and fri and 1 full pill tues, thurs,sat and sun    Lisinopril 40 Mg Tabs (Lisinopril) .Marland Kitchen... Take 1 tablet by mouth once a day  Complete Medication List: 1)  Loratadine 10 Mg Tabs (Loratadine) .... Take 1 tablet by mouth once a day 2)  Proventil Hfa 108 (90 Base) Mcg/act Aers (Albuterol sulfate) .... Inhale 2 puffs every 4 hours as needed for wheezing or shortness of breath. 3)  Oscal 500/200 D-3 500-200 Mg-unit Tabs (Calcium-vitamin d) .... Take 1 tablet by mouth three times a day 4)  Ranitidine Hcl 75 Mg Tabs (Ranitidine hcl) .... Take 1 tablet by mouth once a day as needed 5)  Imdur 60 Mg Tb24 (Isosorbide mononitrate) .... Take 1 tablet by mouth once a day 6)  Bayer Low Strength 81 Mg Tbec (Aspirin) .... Take 1 tablet by mouth once a day 7)  Fosamax 35 Mg Tabs (Alendronate sodium) .... Take 1 tablet by mouth once a week. 8)  Serevent Diskus 50 Mcg/dose Aepb (Salmeterol xinafoate) .... Inhale contents of 1 capsule once a day 9)  Nitroglycerin 0.4 Mg Subl (Nitroglycerin) .... Take 1 tablet sublingual as needed 10)  Senokot 8.6 Mg Tabs (Sennosides) .... Take 1-2  tablets by mouth once a day 11)  Bd Insulin Syringe Ultrafine 31g X 5/16" 0.5 Ml Misc (Insulin syringe-needle u-100) .... Use syringe to inject insulin three times a day as instructed. 12)  Carvedilol 12.5 Mg Tabs (Carvedilol) .... Take 1 tablet by mouth two times a day 13)  Flunisolide 0.025 % Soln (Flunisolide) .... Two sprays per nostril two times a day. 14)  Lantus Solostar 100  Unit/ml Soln (Insulin glargine)  .... Inject 10 units into skin every night 15)  Crestor 20 Mg Tabs (Rosuvastatin calcium) .... Take one tablet by mouth daily 16)  Warfarin Sodium 5 Mg Tabs (Warfarin sodium) .... Take 1/2 pill mon, wed,and fri and 1 full pill tues, thurs,sat and sun 17)  Onetouch Ultra Blue Strp (Glucose blood) .... Use to check blood sugar three a day 18)  Clonidine Hcl 0.1 Mg/24hr Ptwk (Clonidine hcl) .... Apply one patch to skin every week 19)  Amlodipine Besylate 10 Mg Tabs (Amlodipine besylate) .... Take 1 tablet by mouth once a day 20)  Lisinopril 40 Mg Tabs (Lisinopril) .... Take 1 tablet by mouth once a day 21)  Ambien 5 Mg Tabs (Zolpidem tartrate) .... Take 1 tab by mouth at bedtime  Other Orders: Capillary Blood Glucose/CBG ([id number]) T-Protime, Auto ([id number]) Misc. Referral (Misc. Ref)  Patient Instructions: 1)  Please schedule a follow-up appointment in 2-3 weeks with Dr. Arvilla Market (first available please). 2)  Please schedule an appointment with Dr. Alexandria Lodge as soon as possible to discuss your coumadin (blood thinner) managment. 3)  Be sure your home health nurse checks your blood pressure every time they visit.  Record these numbers on a sheet of paper and bring this paper with you to your next visit. 4)  Take only 10 units of insulin every night. 5)  Take only the medicine on your medication list from today's visit. Prescriptions: LISINOPRIL 40 MG TABS (LISINOPRIL) Take 1 tablet by mouth once a day  #30 x 3   Entered and Authorized by:   Nelda Bucks DO   Signed by:   Nelda Bucks DO on 07/02/2010   Method used:   Faxed to ...       [facility] (retail)       [address]       [place], [place]  [address]       Ph: [phone]       Fax: [phone]   RxID:   [id number] AMLODIPINE BESYLATE 10 MG TABS (AMLODIPINE BESYLATE) Take 1 tablet by mouth once a day  #30 x 3   Entered and Authorized by:   Nelda Bucks DO   Signed by:   Nelda Bucks DO on  07/02/2010   Method used:   Faxed to ...       [facility] (retail)       [address]       [place], [place]  [address]       Ph: [phone]       Fax: [phone]   RxID:   [id number] CLONIDINE HCL 0.1 MG/24HR PTWK (CLONIDINE HCL) Apply one patch to skin every week  #4 x 3   Entered and Authorized by:   Nelda Bucks DO   Signed by:   Nelda Bucks DO on 07/02/2010   Method used:   Faxed to ...       [facility] (retail)       [address]       [place], [place]  [address]       Ph: [phone]       Fax: [phone]   RxID:   [id number] CRESTOR 20 MG TABS (ROSUVASTATIN CALCIUM) Take one tablet by mouth daily  #30 x 5   Entered and Authorized  by:   Nelda Bucks DO   Signed by:   Nelda Bucks DO on 07/02/2010   Method used:   Faxed to ...       [facility] (retail)       [address]       [place], [place]  [address]       Ph: [phone]       Fax: [phone]   RxID:   [id number] CARVEDILOL 12.5 MG TABS (CARVEDILOL) Take 1 tablet by mouth two times a day  #60 x 2   Entered and Authorized by:   Nelda Bucks DO   Signed by:   Nelda Bucks DO on 07/02/2010   Method used:   Faxed to ...       [facility] (retail)       [address]       [place], [place]  [address]       Ph: [phone]       Fax: [phone]   RxID:   [id number] NITROGLYCERIN 0.4 MG SUBL (NITROGLYCERIN) take 1 tablet sublingual as needed  #25 x 3   Entered and Authorized by:   Nelda Bucks DO   Signed by:   Nelda Bucks DO on 07/02/2010   Method used:   Faxed to ...       [facility] (retail)       [address]       [place], [place]  [address]       Ph: [phone]       Fax: [phone]   RxID:   [id number] LISINOPRIL 40 MG TABS (LISINOPRIL) Take 1 tablet by mouth once a day  #30 x 3   Entered and  Authorized by:   Nelda Bucks DO   Signed by:   Nelda Bucks DO on 07/02/2010   Method used:   Print then Give to Patient   RxID:   [id number] CLONIDINE HCL 0.1 MG/24HR PTWK (CLONIDINE HCL) Apply one patch to skin every week  #4 x 3   Entered and Authorized by:   Nelda Bucks DO   Signed by:   Nelda Bucks DO on 07/02/2010   Method used:   Print then Give to Patient   RxID:   [id number] CRESTOR 20 MG TABS (ROSUVASTATIN CALCIUM) Take one tablet by mouth daily  #30 x 5   Entered and Authorized by:   Nelda Bucks DO   Signed by:   Nelda Bucks DO on 07/02/2010   Method used:   Print then Give to Patient   RxID:   [id number] NITROGLYCERIN 0.4 MG SUBL (NITROGLYCERIN) take 1 tablet sublingual as needed  #25 x 3   Entered and Authorized by:   Nelda Bucks DO   Signed by:   Nelda Bucks DO on 07/02/2010   Method used:   Print then Give to Patient   RxID:   [id number] CARVEDILOL 12.5 MG TABS (CARVEDILOL) Take 1 tablet by mouth two times a day  #60 x 2   Entered and Authorized by:   Nelda Bucks DO   Signed by:   Nelda Bucks DO on 07/02/2010   Method used:   Print then Give to Patient   RxID:   [id number] AMLODIPINE BESYLATE 10 MG TABS (AMLODIPINE BESYLATE) Take 1 tablet by mouth once a day  #  30 x 3   Entered and Authorized by:   Nelda Bucks DO   Signed by:   Nelda Bucks DO on 07/02/2010   Method used:   Print then Give to Patient   RxID:   [id number] NITROGLYCERIN 0.4 MG SUBL (NITROGLYCERIN) take 1 tablet sublingual as needed  #25 x 3   Entered and Authorized by:   Nelda Bucks DO   Signed by:   Nelda Bucks DO on 07/02/2010   Method used:   Faxed to ...       [facility] (retail)       [address]       [place], [place]  [address]       Ph: [phone]       Fax: [phone]   RxID:   [id number] LISINOPRIL 40 MG TABS (LISINOPRIL) Take 1 tablet by mouth once a day  #30 x 0   Entered and  Authorized by:   Nelda Bucks DO   Signed by:   Nelda Bucks DO on 07/02/2010   Method used:   Faxed to ...       [facility] (retail)       [address]       [place], [place]  [address]       Ph: [phone]       Fax: [phone]   RxID:   [id number] ONETOUCH ULTRA BLUE  STRP (GLUCOSE BLOOD) use to check blood sugar three a day  #1 box x 11   Entered and Authorized by:   Nelda Bucks DO   Signed by:   Nelda Bucks DO on 07/02/2010   Method used:   Print then Give to Patient   RxID:   [id number] ONETOUCH ULTRA BLUE  STRP (GLUCOSE BLOOD) use to check blood sugar three a day  #1 box x 11   Entered and Authorized by:   Nelda Bucks DO   Signed by:   Nelda Bucks DO on 07/02/2010   Method used:   Faxed to ...       [facility] (retail)       [address]       [place], [place]  [address]       Ph: [phone]       Fax: [phone]   RxID:   [id number] ONETOUCH ULTRA BLUE  STRP (GLUCOSE BLOOD) use to check blood sugar twice a dauy  #1 box x 11   Entered and Authorized by:   Nelda Bucks DO   Signed by:   Nelda Bucks DO on 07/02/2010   Method used:   Faxed to ...       [facility] (retail)       [address]       [place], [place]  [address]       Ph: [phone]       Fax: [phone]   RxID:   [id number]  Process Orders Check Orders Results:     [facility]: Check successful Tests Sent for requisitioning (July 03, 2010 8:32 AM):     07/02/2010: [facility] -- T-Basic Metabolic Panel [phone] (signed)     07/02/2010: [facility] -- T-Protime, Auto [id number] (signed)     Prevention & Chronic  Care Immunizations   Influenza vaccine: Fluvax MCR  (06/21/2010)    Tetanus booster: Not documented    Pneumococcal vaccine: Historical  (07/25/2006)    H. zoster vaccine: Not  documented  Colorectal Screening   Hemoccult: Negative  (07/13/2002)    Colonoscopy: Not documented  Other Screening   Pap smear: Normal  (11/14/2001)    Mammogram: ASSESSMENT: Negative - BI-RADS 1^MM DIGITAL SCREENING  (12/28/2009)   Mammogram action/deferral: Screening mammogram in 1 year.     (12/22/2008)    DXA bone density scan: Not documented   Smoking status: never  (07/02/2010)  Diabetes Mellitus   HgbA1C: 8.8  (05/17/2010)   HgbA1C action/deferral: Ordered  (05/17/2010)    Eye exam: + diabetic retinopathy, stable.  (07/20/2009)   Diabetic eye exam action/deferral: Ophthalmology referral  (07/12/2009)   Eye exam due: 01/18/2010    Foot exam: yes  (07/02/2010)   Foot exam action/deferral: Do today   High risk foot: Not documented   Foot care education: Done  (05/17/2010)    Urine microalbumin/creatinine ratio: 201.7  (07/12/2009)   Urine microalbumin action/deferral: Ordered  Lipids   Total Cholesterol: 137  (05/17/2010)   Lipid panel action/deferral: Lipid Panel ordered   LDL: 64  (05/17/2010)   LDL Direct: Not documented   HDL: 62  (05/17/2010)   Triglycerides: 54  (05/17/2010)    SGOT (AST): 21  (06/21/2010)   BMP action: Ordered   SGPT (ALT): 12  (06/21/2010)   Alkaline phosphatase: 80  (06/21/2010)   Total bilirubin: 0.3  (06/21/2010)  Hypertension   Last Blood Pressure: 166 / 67  (07/02/2010)   Serum creatinine: 1.54  (06/27/2010)   BMP action: Ordered   Serum potassium 3.6  (06/27/2010)  Self-Management Support :   Personal Goals (by the next clinic visit) :     Personal A1C goal: 7  (07/12/2009)     Personal blood pressure goal: 140/90  (07/12/2009)     Personal LDL goal: 70  (07/12/2009)    Patient will work on the following items until the next clinic visit to reach self-care goals:     Medications and monitoring: take my medicines every day, check my blood sugar, bring all of my medications to every visit, weigh myself weekly, examine my  feet every day  (07/02/2010)     Eating: eat more vegetables, use fresh or frozen vegetables, eat foods that are low in salt, eat baked foods instead of fried foods, eat fruit for snacks and desserts, limit or avoid alcohol  (07/02/2010)     Activity: take a 30 minute walk every day  (07/02/2010)     Other: fluid restrictions by cardiology - not exercising -trying to lift arms and feet  (05/17/2010)    Diabetes self-management support: Copy of home glucose meter record, Written self-care plan, Education handout, Resources for patients handout  (07/02/2010)   Diabetes care plan printed   Diabetes education handout printed   Last diabetes self-management training by diabetes educator: 03/07/2009    Hypertension self-management support: Written self-care plan, Education handout, Resources for patients handout  (07/02/2010)   Hypertension self-care plan printed.   Hypertension education handout printed    Lipid self-management support: Written self-care plan, Education handout, Resources for patients handout  (07/02/2010)   Lipid self-care plan printed.   Lipid education handout printed      Resource handout printed.   Last LDL:  64 (05/17/2010 8:45:00 PM)        Diabetic Foot Exam Foot Inspection Is there a history of a foot ulcer?              No Is there a foot ulcer now?              No Can the patient see the bottom of their feet?          Yes Are the shoes appropriate in style and fit?          Yes Is there swelling or an abnormal foot shape?          No Are the toenails long?                No Are the toenails thick?                No Are the toenails ingrown?              No Is there heavy callous build-up?              No Is there a claw toe deformity?                          No Is there elevated skin temperature?            No Is there limited ankle dorsiflexion?            No Is there foot or ankle muscle weakness?             No Do you have pain in calf while walking?           No         10-g (5.07) Semmes-Weinstein Monofilament Test Performed by: Stanton Kidney Ditzler RN          Right Foot          Left Foot Visual Inspection     normal         normal Test Control      normal         normal Site 1         normal         normal Site 2         normal         normal Site 3         normal         normal Site 4         normal         normal Site 5         normal         normal Site 6         normal         normal Site 7         normal         normal Site 8         normal         normal Site 9         normal         normal Site 10         normal         normal  Impression      normal         normal

## 2010-11-12 NOTE — Discharge Summary (Signed)
Summary: Hospital Discharge Update    Hospital Discharge Update:  Date of Admission: 06/07/2010 Date of Discharge: 06/16/2010  Brief Summary:  Patient was admitted with AMS and found to be acute renal insufficiency. She was also diagnosed with UTI which was treated adequately. AMS resolved after treatment. Renal insuffciency was most likely due to Contrast nephropathy, and dehydration. Her crt at discharge was 2.14. 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  Lab or other results pending at discharge:  none  Labs needed at follow-up: Basic metabolic panel, PT/INR  Other follow-up issues:  We need to manage her HTN acc to the BP readings. She also needs adjustment of her CHF meds. Patient's Lantus was cut back to 20 due to hypoglycemic events and needs to be followed as her home dose is 30.  Need to hook her up with Coumadin clinic.  Medication list changes:  Removed medication of LASIX 40 MG TABS (FUROSEMIDE) Take 1 tablet by mouth two times a day Removed medication of LISINOPRIL 40 MG TABS (LISINOPRIL) Take 1 tablet by mouth once a day Removed medication of METFORMIN HCL 500 MG  TABS (METFORMIN HCL) Take 1 tablet by mouth two times a day Removed medication of CLONIDINE HCL 0.1 MG/24HR PTWK (CLONIDINE HCL) Place one patch to skin every week Added new medication of HYDRALAZINE HCL 25 MG TABS (HYDRALAZINE HCL) Take 1 tablet by mouth three times a day - Signed Removed medication of CHERATUSSIN AC 100-10 MG/5ML SYRP (GUAIFENESIN-CODEINE) Take 5mL by mouth q6h as needed cough Changed medication from LANTUS SOLOSTAR 100 UNIT/ML SOLN (INSULIN GLARGINE) Inject 34 units into skin every night to LANTUS SOLOSTAR 100 UNIT/ML SOLN (INSULIN GLARGINE) Inject 20 units into skin every night Removed medication of KLOR-CON 20 MEQ PACK (POTASSIUM CHLORIDE) Take 1 tablet by mouth once a day Removed medication of TRAMADOL HCL 50 MG TABS (TRAMADOL HCL)  Take 1 tablet by mouth every 6 hours as needed for pain Added new medication of WARFARIN SODIUM 5 MG TABS (WARFARIN SODIUM) take 1/2 to 1 tab as directed by the pharmacy Rx of HYDRALAZINE HCL 25 MG TABS (HYDRALAZINE HCL) Take 1 tablet by mouth three times a day;  #93 x 1;  Signed;  Entered by: Lars Mage MD;  Authorized by: Lars Mage MD;  Method used: Electronically to [facility] [address], [place], [place], [place]  [address], Ph: [phone], Fax: [phone]  The medication, problem, and allergy lists have been updated.  Please see the dictated discharge summary for details.  Discharge medications:  LORATADINE 10 MG TABS (LORATADINE) Take 1 tablet by mouth once a day PROVENTIL HFA 108 (90 BASE) MCG/ACT AERS (ALBUTEROL SULFATE) Inhale 2 puffs every 4 hours as needed for wheezing or shortness of breath. OSCAL 500/200 D-3 500-200 MG-UNIT TABS (CALCIUM-VITAMIN D) Take 1 tablet by mouth three times a day RANITIDINE HCL 75 MG  TABS (RANITIDINE HCL) Take 1 tablet by mouth once a day as needed IMDUR 60 MG TB24 (ISOSORBIDE MONONITRATE) Take 1 tablet by mouth once a day BAYER LOW STRENGTH 81 MG TBEC (ASPIRIN) Take 1 tablet by mouth once a day FOSAMAX 35 MG TABS (ALENDRONATE SODIUM) Take 1 tablet by mouth once a week. SEREVENT DISKUS 50 MCG/DOSE AEPB (SALMETEROL XINAFOATE) Inhale contents of 1 capsule once a day NITROGLYCERIN 0.4 MG SUBL (NITROGLYCERIN) take 1 tablet sublingual as needed SENOKOT 8.6 MG  TABS (SENNOSIDES) Take 1-2  tablets by mouth once a day BD  INSULIN SYRINGE ULTRAFINE 31G X 5/16" 0.5 ML MISC (INSULIN SYRINGE-NEEDLE U-100) Use syringe to inject insulin three times a day as instructed. CARVEDILOL 6.25 MG TABS (CARVEDILOL) Take 1 tablet by mouth two times a day FLUNISOLIDE 0.025 % SOLN (FLUNISOLIDE) Two sprays per nostril two times a day. LANTUS SOLOSTAR 100 UNIT/ML SOLN (INSULIN GLARGINE) Inject 20 units into skin every night AMBIEN 5 MG TABS (ZOLPIDEM TARTRATE)  Take 1 tablet by mouth at bedtime CRESTOR 20 MG TABS (ROSUVASTATIN CALCIUM) Take one tablet by mouth daily PRODIGY BLOOD GLUCOSE MONITOR  DEVI (BLOOD GLUCOSE MONITORING SUPPL) use to check blood sugar twice daily PRODIGY AUTOCODE BLOOD GLUCOSE  STRP (GLUCOSE BLOOD) use to check blood sugar twice daily PRODIGY TWIST TOP LANCETS 28G  MISC (LANCETS) use to check blood sugar twice daily HYDRALAZINE HCL 25 MG TABS (HYDRALAZINE HCL) Take 1 tablet by mouth three times a day WARFARIN SODIUM 5 MG TABS (WARFARIN SODIUM) take 1/2 to 1 tab as directed by the pharmacy   Note: Hospital Discharge Medications & Other Instructions handout was printed, one copy for patient and a second copy to be placed in hospital chart.

## 2010-11-12 NOTE — Progress Notes (Signed)
Summary: Refill/gh  Phone Note Refill Request Message from:  Fax from Pharmacy on May 03, 2010 11:03 AM  Refills Requested: Medication #1:  PRODIGY AUTOCODE BLOOD GLUCOSE  STRP use to check blood sugar twice daily  Method Requested: Electronic Initial call taken by: Angelina Ok RN,  May 03, 2010 11:03 AM  Follow-up for Phone Call        Rx faxed to pharmacy Follow-up by: Nelda Bucks DO,  May 03, 2010 4:03 PM    Prescriptions: PRODIGY TWIST TOP LANCETS 28G  MISC (LANCETS) use to check blood sugar twice daily  #100 x 127   Entered and Authorized by:   Nelda Bucks DO   Signed by:   Nelda Bucks DO on 05/03/2010   Method used:   Faxed to ...       [facility] (retail)       [address]       [place], [place]  [address]       Ph: [phone]       Fax: [phone]   RxID:   [id number] PRODIGY AUTOCODE BLOOD GLUCOSE  STRP (GLUCOSE BLOOD) use to check blood sugar twice daily  #100 x 12   Entered and Authorized by:   Nelda Bucks DO   Signed by:   Nelda Bucks DO on 05/03/2010   Method used:   Faxed to ...       [facility] (retail)       [address]       [place], [place]  [address]       Ph: [phone]       Fax: [phone]   RxID:   [id number]

## 2010-11-12 NOTE — Progress Notes (Signed)
Summary: Refill/gh  Phone Note Refill Request Message from:  Fax from Pharmacy on Mar 12, 2010 3:41 PM  Refills Requested: Medication #1:  LASIX 40 MG TABS Take 3 tablets by mouth daily   Last Refilled: 09/13/2009  Medication #2:  NAPROXEN 500 MG TABS Take 1 tablet by mouth two times a day with food.   Last Refilled: 09/13/2009  Method Requested: Electronic Initial call taken by: Angelina Ok RN,  Mar 12, 2010 3:42 PM  Follow-up for Phone Call        Rx for naprosyn sent to pharmacy. Pt was intructed to make an appt the last time her Lasix was refilled on 09/13/09.  She has not been seen in the office since 08/06/10 and last labs were drawn in 06/2009.  She must be seen for review of her BP and check of CMET before she can have further refills.  If she can see me sometime this month or in July (without overbooking) then I will give her refills to last until her appt - this must be scheduled before I will give refills.  If I do not have any openings in June or July, then please book her with an [facility] doctor for BP and CMET check; A1c should also be drawn at her next appt.  Please let me know when her appt is scheduled. Follow-up by: Nelda Bucks DO,  March 13, 2010 12:09 PM  Additional Follow-up for Phone Call Additional follow up Details #1::        Rx faxed to pharmacy.  Flag to [facility] to schedule appointment for pt. Additional Follow-up by: Angelina Ok RN,  March 13, 2010 3:30 PM    Additional Follow-up for Phone Call Additional follow up Details #2::    Thank you! Follow-up by: Nelda Bucks DO,  March 18, 2010 8:59 AM  Prescriptions: NAPROXEN 500 MG TABS (NAPROXEN) Take 1 tablet by mouth two times a day with food.  #60 x 11   Entered and Authorized by:   Nelda Bucks DO   Signed by:   Nelda Bucks DO on 03/13/2010   Method used:   Faxed to ...       [facility] (retail)       [address]       [place], [place]  [address]       Ph:  [phone]       Fax: [phone]   RxID:   [id number]

## 2010-11-12 NOTE — Progress Notes (Signed)
Summary: refill/gg  Phone Note Refill Request  on January 02, 2010 3:29 PM  Refills Requested: Medication #1:  DILTIAZEM HCL 60 MG TABS Take 2 tablets by mouth two times a day   Last Refilled: 08/15/2009 # 120   Method Requested: Fax to Local Pharmacy Initial call taken by: Merrie Roof RN,  January 02, 2010 3:29 PM  Follow-up for Phone Call        Rx faxed to pharmacy Follow-up by: Nelda Bucks DO,  January 03, 2010 8:12 PM    Prescriptions: DILTIAZEM HCL 60 MG TABS (DILTIAZEM HCL) Take 2 tablets by mouth two times a day  #120 x 2   Entered and Authorized by:   Nelda Bucks DO   Signed by:   Nelda Bucks DO on 01/03/2010   Method used:   Faxed to ...       [facility] (retail)       [address]       [place], [place]  [address]       Ph: [phone]       Fax: [phone]   RxID:   [id number]
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
## 2010-11-12 NOTE — Progress Notes (Signed)
Summary: PT, INR at clinic visit/ hla  Phone Note Other Incoming   Summary of Call: bill, HHN, called to state pt has appt in clinic tomorrow, so can PT, INR be drawn at clinic visit. will send to pcp Initial call taken by: Marin Roberts RN,  June 20, 2010 12:05 PM  Follow-up for Phone Call        yes, we can draw the labs in clinic.  thanks! Follow-up by: Nelda Bucks DO,  June 20, 2010 12:47 PM

## 2010-11-12 NOTE — Miscellaneous (Signed)
[facility]: [facility]   Imported By: Shon Hough 09/03/2010 11:00:03  _____________________________________________________________________  External Attachment:    Type:   Image     Comment:   External Document

## 2010-11-12 NOTE — Progress Notes (Signed)
Summary: med refill/gp  Phone Note Refill Request Message from:  Fax from Pharmacy on May 02, 2010 10:32 AM  Refills Requested: Medication #1:  PROVENTIL HFA 108 (90 BASE) MCG/ACT AERS Inhale 2 puffs every 4 hours as needed for wheezing or shortness of breath.  Also need refill on Syringes Ultrafine  0.5cc 31G  #100 Has an appt. Aug. 2.   Method Requested: Telephone to Pharmacy Initial call taken by: Chinita Pester RN,  May 02, 2010 10:32 AM  Follow-up for Phone Call        Rx faxed to pharmacy Follow-up by: Nelda Bucks DO,  May 02, 2010 1:02 PM    Prescriptions: BD INSULIN SYRINGE ULTRAFINE 31G X 5/16" 0.5 ML MISC (INSULIN SYRINGE-NEEDLE U-100) Use syringe to inject insulin three times a day as instructed.  #100 x 11   Entered and Authorized by:   Nelda Bucks DO   Signed by:   Nelda Bucks DO on 05/02/2010   Method used:   Faxed to ...       [facility] (retail)       [address]       [place], [place]  [address]       Ph: [phone]       Fax: [phone]   RxID:   [id number] PROVENTIL HFA 108 (90 BASE) MCG/ACT AERS (ALBUTEROL SULFATE) Inhale 2 puffs every 4 hours as needed for wheezing or shortness of breath.  #1 x 11   Entered and Authorized by:   Nelda Bucks DO   Signed by:   Nelda Bucks DO on 05/02/2010   Method used:   Faxed to ...       [facility] (retail)       [address]       [place], [place]  [address]       Ph: [phone]       Fax: [phone]   RxID:   [id number]

## 2010-11-12 NOTE — Assessment & Plan Note (Signed)
Summary: COU/CH  Anticoagulant Therapy Referring MD: Landis Martins PCP: Nelda Bucks DO [facility] Attending: Lowella Bandy MD Indication 1: TIA/CVA Indication 2: Encounter for therapeutic drug monitoring  V58.83 Start date: 06/05/2010  Patient Assessment Reviewed by: Chancy Milroy PharmD  August 26, 2010 Medication review: verified warfarin dosage & schedule,verified previous prescription medications, verified doses & any changes, verified new medications, reviewed OTC medications, reviewed OTC health products-vitamins supplements etc Complications: none Dietary changes: none   Health status changes: none   Lifestyle changes: none   Recent/future hospitalizations: none   Recent/future procedures: none   Recent/future dental: none Patient Assessment Part 2:  Have you MISSED ANY DOSES or CHANGED TABLETS?  No missed Warfarin doses or changed tablets.  Have you had any BRUISING or BLEEDING ( nose or gum bleeds,blood in urine or stool)?  No reported bruising or bleeding in nose, gums, urine, stool.  Have you STARTED or STOPPED any MEDICATIONS, including OTC meds,herbals or supplements?  No other medications or herbal supplements were started or stopped.  Have you CHANGED your DIET, especially green vegetables,or ALCOHOL intake?  No changes in diet or alcohol intake.  Have you had any ILLNESSES or HOSPITALIZATIONS?  No reported illnesses or hospitalizations  Have you had any signs of CLOTTING?(chest discomfort,dizziness,shortness of breath,arms tingling,slurred speech,swelling or redness in leg)    No chest discomfort, dizziness, shortness of breath, tingling in arm, slurred speech, swelling, or redness in leg.     Treatment  Target INR: 2.0-3.0 INR: 2.1  Date: 08/26/2010 Regimen In:  40.0mg /week INR reflects regimen in: 2.1  New  Tablet strength: : 5mg  Regimen Out:     Sunday: 1 & 1/2 Tablet     Monday: 1 Tablet     Tuesday: 1 & 1/2 Tablet     Wednesday: 1 Tablet     Thursday: 1  & 1/2 Tablet      Friday: 1 Tablet     Saturday: 1 & 1/2 Tablet Total Weekly: 45.0mg /week mg  Next INR Due: 09/16/2010 Adjusted by: Barbera Setters. Alexandria Lodge III PharmD CACP   Return to anticoagulation clinic:  09/16/2010 Time of next visit: 1100    Allergies: 1)  ! * Baking Soda 2)  ! * Three 6's Tonic 3)  ! * Epson's Salt

## 2010-11-12 NOTE — Progress Notes (Signed)
Summary: Refill/gh  Phone Note Refill Request Message from:  Fax from Pharmacy on June 03, 2010 12:02 PM  Refills Requested: Medication #1:  AMBIEN 5 MG TABS Take 1 tablet by mouth at bedtime   Last Refilled: 07/12/2009  Method Requested: Electronic Initial call taken by: Angelina Ok RN,  June 03, 2010 12:02 PM  Follow-up for Phone Call        Refill approved-nurse to complete Follow-up by: Nelda Bucks DO,  June 03, 2010 3:03 PM    Prescriptions: AMBIEN 5 MG TABS (ZOLPIDEM TARTRATE) Take 1 tablet by mouth at bedtime  #30 x 0   Entered and Authorized by:   Nelda Bucks DO   Signed by:   Nelda Bucks DO on 06/03/2010   Method used:   Telephoned to ...       [facility]       [address]       [place], [place]  [address]       Ph: [phone]       Fax: [phone]   RxID:   [id number]

## 2010-11-12 NOTE — Assessment & Plan Note (Signed)
Summary: 845AM COU NEW PT TO [facility]  Anticoagulant Therapy Referring MD: Landis Martins PCP: Nelda Bucks DO Indication 1: TIA/CVA Indication 2: Encounter for therapeutic drug monitoring  V58.83 Start date: 06/05/2010  Patient Assessment Reviewed by: Chancy Milroy PharmD  July 08, 2010 Medication review: verified warfarin dosage & schedule,verified previous prescription medications, verified doses & any changes, verified new medications, reviewed OTC medications, reviewed OTC health products-vitamins supplements etc Complications: none Dietary changes: none   Health status changes: none   Lifestyle changes: none   Recent/future hospitalizations: none   Recent/future procedures: none   Recent/future dental: none Patient Assessment Part 2:  Have you MISSED ANY DOSES or CHANGED TABLETS?  No missed Warfarin doses or changed tablets.  Have you had any BRUISING or BLEEDING ( nose or gum bleeds,blood in urine or stool)?  No reported bruising or bleeding in nose, gums, urine, stool.  Have you STARTED or STOPPED any MEDICATIONS, including OTC meds,herbals or supplements?  No other medications or herbal supplements were started or stopped.  Have you CHANGED your DIET, especially green vegetables,or ALCOHOL intake?  No changes in diet or alcohol intake.  Have you had any ILLNESSES or HOSPITALIZATIONS?  No reported illnesses or hospitalizations  Have you had any signs of CLOTTING?(chest discomfort,dizziness,shortness of breath,arms tingling,slurred speech,swelling or redness in leg)    No chest discomfort, dizziness, shortness of breath, tingling in arm, slurred speech, swelling, or redness in leg.     Treatment  Target INR: 2.0-3.0 INR: 1.4  Date: 07/08/2010 INR reflects regimen in: 1.4  New  Tablet strength: : 5mg  Regimen Out:     Sunday: 1 Tablet     Monday: 1 Tablet     Tuesday: 1 Tablet     Wednesday: 1 Tablet     Thursday: 1 Tablet      Friday: 1 Tablet     Saturday: 1  Tablet Total Weekly: 35.0mg /week mg  Next INR Due: 07/15/2010 Adjusted by: Barbera Setters. Alexandria Lodge III PharmD CACP   Return to anticoagulation clinic:  07/15/2010 Time of next visit: 0930   Comments: First visit to my anticoagulation clinic.   Allergies: 1)  ! * Baking Soda 2)  ! * Three 6's Tonic 3)  ! * Epson's Salt

## 2010-11-12 NOTE — Letter (Signed)
Summary: DIABETES-BLOOD GLUCOSE REPORT 07/06/-05/16/10  DIABETES-BLOOD GLUCOSE REPORT 07/06/-05/16/10   Imported By: Shon Hough 05/22/2010 16:04:19  _____________________________________________________________________  External Attachment:    Type:   Image     Comment:   External Document

## 2010-11-12 NOTE — Assessment & Plan Note (Signed)
Summary: COU/CH  Anticoagulant Therapy Referring MD: Landis Martins PCP: Nelda Bucks DO [facility] Attending: Onalee Hua MD, Manrique Indication 1: TIA/CVA Indication 2: Encounter for therapeutic drug monitoring  V58.83 Start date: 06/05/2010  Patient Assessment Reviewed by: Chancy Milroy PharmD  August 05, 2010 Medication review: verified warfarin dosage & schedule,verified previous prescription medications, verified doses & any changes, verified new medications, reviewed OTC medications, reviewed OTC health products-vitamins supplements etc Complications: none Dietary changes: none   Health status changes: none   Lifestyle changes: none   Recent/future hospitalizations: none   Recent/future procedures: none   Recent/future dental: none Patient Assessment Part 2:  Have you MISSED ANY DOSES or CHANGED TABLETS?  No missed Warfarin doses or changed tablets.  Have you had any BRUISING or BLEEDING ( nose or gum bleeds,blood in urine or stool)?  No reported bruising or bleeding in nose, gums, urine, stool.  Have you STARTED or STOPPED any MEDICATIONS, including OTC meds,herbals or supplements?  No other medications or herbal supplements were started or stopped.  Have you CHANGED your DIET, especially green vegetables,or ALCOHOL intake?  No changes in diet or alcohol intake.  Have you had any ILLNESSES or HOSPITALIZATIONS?  No reported illnesses or hospitalizations  Have you had any signs of CLOTTING?(chest discomfort,dizziness,shortness of breath,arms tingling,slurred speech,swelling or redness in leg)    No chest discomfort, dizziness, shortness of breath, tingling in arm, slurred speech, swelling, or redness in leg.     Treatment  Target INR: 2.0-3.0 INR: 2.7  Date: 08/05/2010 Regimen In:  40.0mg /week INR reflects regimen in: 2.7  New  Tablet strength: : 5mg  Regimen Out:     Sunday: 1 Tablet     Monday: 1 & 1/2 Tablet     Tuesday: 1 Tablet     Wednesday: 1 Tablet     Thursday: 1 &  1/2 Tablet      Friday: 1 Tablet     Saturday: 1 Tablet Total Weekly: 40.0mg /week mg  Next INR Due: 08/26/2010 Adjusted by: Barbera Setters. Alexandria Lodge III PharmD CACP   Return to anticoagulation clinic:  08/26/2010 Time of next visit: 1045    Allergies: 1)  ! * Baking Soda 2)  ! * Three 6's Tonic 3)  ! * Epson's Salt
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
## 2010-11-12 NOTE — Letter (Signed)
Summary: BLOOD GLUCOSE REPORT  BLOOD GLUCOSE REPORT   Imported By: Margie Billet 07/04/2010 14:42:09  _____________________________________________________________________  External Attachment:    Type:   Image     Comment:   External Document

## 2010-11-12 NOTE — Progress Notes (Signed)
Summary: med refill/gp  Phone Note Refill Request Message from:  Fax from Pharmacy on May 30, 2010 12:05 PM  Refills Requested: Medication #1:  CLONIDINE HCL 0.1 MG/24HR PTWK Place one patch to skin every week   Last Refilled: 11/08/2009 Last appt. Aug 5.   Method Requested: Telephone to Pharmacy Initial call taken by: Chinita Pester RN,  May 30, 2010 12:05 PM  Follow-up for Phone Call        Rx faxed to pharmacy Follow-up by: Nelda Bucks DO,  May 30, 2010 12:11 PM    Prescriptions: CLONIDINE HCL 0.1 MG/24HR PTWK (CLONIDINE HCL) Place one patch to skin every week  #4 x 3   Entered and Authorized by:   Nelda Bucks DO   Signed by:   Nelda Bucks DO on 05/30/2010   Method used:   Faxed to ...       [facility] (retail)       [address]       [place], [place]  [address]       Ph: [phone]       Fax: [phone]   RxID:   [id number]
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
## 2010-11-12 NOTE — Progress Notes (Signed)
Summary: refill./gg  Phone Note Refill Request  on July 03, 2010 9:53 AM  Pt needs prodogy meter and strips.   Her insurance will not pay for one touch.  Please change in EMR I will call in because they need Dx code     Method Requested: Telephone to Pharmacy Initial call taken by: Merrie Roof RN,  July 03, 2010 10:24 AM  Follow-up for Phone Call        Rx faxed to pharmacy.  Dx code included in comments Follow-up by: Nelda Bucks DO,  July 03, 2010 11:39 AM    New/Updated Medications: PRODIGY NO CODING BLOOD GLUC  STRP (GLUCOSE BLOOD) use three times a day to check blood sugar Prescriptions: PRODIGY NO CODING BLOOD GLUC  STRP (GLUCOSE BLOOD) use three times a day to check blood sugar  #1 box x 11   Entered by:   Nelda Bucks DO   Authorized by:   Merrie Roof RN   Signed by:   Nelda Bucks DO on 07/03/2010   Method used:   Faxed to ...       [facility] (retail)       [address]       [place], [place]  [address]       Ph: [phone]       Fax: [phone]   RxID:   [id number]

## 2010-11-12 NOTE — Progress Notes (Signed)
Summary: Refill/gh  Phone Note Refill Request Message from:  Fax from Pharmacy on August 16, 2010 11:18 AM  Refills Requested: Medication #1:  WARFARIN SODIUM 5 MG TABS take 1/2 pill mon   Dosage confirmed as above?Dosage Confirmed   Brand Name Necessary? No Pt has had a recent dosage increase and is out.     Method Requested: Fax to Local Pharmacy Initial call taken by: Angelina Ok RN,  August 16, 2010 11:18 AM  Follow-up for Phone Call        Call from Juleen Starr ok to refill pt's Coumadin # 62 tablets take as directed with 3 refills.  Refill called to [facility] with direcctions that wre given to pt at her last visit.  Follow-up by: Angelina Ok RN,  August 16, 2010 11:58 AM  Additional Follow-up for Phone Call Additional follow up Details #1::        Thank youQ Additional Follow-up by: Nelda Bucks DO,  August 16, 2010 1:46 PM    Prescriptions: WARFARIN SODIUM 5 MG TABS (WARFARIN SODIUM) take 1/2 pill mon, wed,and fri and 1 full pill tues, thurs,sat and sun  #62 x 3   Entered and Authorized by:   Nelda Bucks DO   Signed by:   Nelda Bucks DO on 08/16/2010   Method used:   Telephoned to ...       [facility] (retail)       [address]       [place], [place]  [address]       Ph: [phone]       Fax: [phone]   RxID:   [id number]

## 2010-11-12 NOTE — Progress Notes (Signed)
Summary: phone/gg  Phone Note From Other Clinic   Summary of Call: Received call from Diane, Occupational Therapist with [facility] requesting an order to continue care fot OT for 2 X's a week for one more week.  I did okay the  verble order, is this okay with you? Pt is doing well.  BP is elevated at 168/72 and SOB is improved Initial call taken by: Merrie Roof RN,  July 05, 2010 10:03 AM  Follow-up for Phone Call        Yes, this is ok.  Thank you! Follow-up by: Nelda Bucks DO,  July 13, 2010 7:35 AM
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
## 2010-11-14 NOTE — Assessment & Plan Note (Signed)
Summary: COU/CH  Anticoagulant Therapy Referring MD: Landis Martins PCP: Nelda Bucks DO [facility] Attending: Rogelia Boga MD, Lanora Manis Indication 1: TIA/CVA Indication 2: Encounter for therapeutic drug monitoring  V58.83 Start date: 06/05/2010  Patient Assessment Reviewed by: Chancy Milroy PharmD  October 15, 2010 Medication review: verified warfarin dosage & schedule,verified previous prescription medications, verified doses & any changes, verified new medications, reviewed OTC medications, reviewed OTC health products-vitamins supplements etc Complications: none Dietary changes: none   Health status changes: none   Lifestyle changes: none   Recent/future hospitalizations: none   Recent/future procedures: none   Recent/future dental: none Patient Assessment Part 2:  Have you MISSED ANY DOSES or CHANGED TABLETS?  No missed Warfarin doses or changed tablets.  Have you had any BRUISING or BLEEDING ( nose or gum bleeds,blood in urine or stool)?  No reported bruising or bleeding in nose, gums, urine, stool.  Have you STARTED or STOPPED any MEDICATIONS, including OTC meds,herbals or supplements?  No other medications or herbal supplements were started or stopped.  Have you CHANGED your DIET, especially green vegetables,or ALCOHOL intake?  No changes in diet or alcohol intake.  Have you had any ILLNESSES or HOSPITALIZATIONS?  No reported illnesses or hospitalizations  Have you had any signs of CLOTTING?(chest discomfort,dizziness,shortness of breath,arms tingling,slurred speech,swelling or redness in leg)    No chest discomfort, dizziness, shortness of breath, tingling in arm, slurred speech, swelling, or redness in leg.     Treatment  Target INR: 2.0-3.0 INR: 2.8  Date: 10/14/2010 Regimen In:  40.0mg /week INR reflects regimen in: 2.8  New  Tablet strength: : 5mg  Regimen Out:     Sunday: 1 Tablet     Monday: 1 & 1/2 Tablet     Tuesday: 1 Tablet     Wednesday: 1 Tablet     Thursday: 1 &  1/2 Tablet      Friday: 1 Tablet     Saturday: 1 Tablet Total Weekly: 40.0mg /week mg  Next INR Due: 11/11/2010 Adjusted by: Barbera Setters. Alexandria Lodge III PharmD CACP   Return to anticoagulation clinic:  11/11/2010 Time of next visit: 1100    Allergies: 1)  ! * Baking Soda 2)  ! * Three 6's Tonic 3)  ! * Epson's Salt

## 2010-11-20 NOTE — Assessment & Plan Note (Signed)
Summary: COU/CH  Anticoagulant Therapy Referring MD: Landis Martins PCP: Nelda Bucks DO [facility] Attending: Reche Dixon MD, Onalee Hua Indication 1: TIA/CVA Indication 2: Encounter for therapeutic drug monitoring  V58.83 Start date: 06/05/2010  Patient Assessment Reviewed by: Chancy Milroy PharmD  November 11, 2010 Medication review: verified warfarin dosage & schedule,verified previous prescription medications, verified doses & any changes, verified new medications, reviewed OTC medications, reviewed OTC health products-vitamins supplements etc Complications: none Dietary changes: none   Health status changes: none   Lifestyle changes: none   Recent/future hospitalizations: none   Recent/future procedures: none   Recent/future dental: none Patient Assessment Part 2:  Have you MISSED ANY DOSES or CHANGED TABLETS?  No missed Warfarin doses or changed tablets.  Have you had any BRUISING or BLEEDING ( nose or gum bleeds,blood in urine or stool)?  No reported bruising or bleeding in nose, gums, urine, stool.  Have you STARTED or STOPPED any MEDICATIONS, including OTC meds,herbals or supplements?  No other medications or herbal supplements were started or stopped.  Have you CHANGED your DIET, especially green vegetables,or ALCOHOL intake?  No changes in diet or alcohol intake.  Have you had any ILLNESSES or HOSPITALIZATIONS?  No reported illnesses or hospitalizations  Have you had any signs of CLOTTING?(chest discomfort,dizziness,shortness of breath,arms tingling,slurred speech,swelling or redness in leg)    No chest discomfort, dizziness, shortness of breath, tingling in arm, slurred speech, swelling, or redness in leg.     Treatment  Target INR: 2.0-3.0 INR: 2.0  Date: 11/11/2010 Regimen In:  40.0mg /week INR reflects regimen in: 2.0  New  Tablet strength: : 5mg  Regimen Out:     Sunday: 1 & 1/2 Tablet     Monday: 1 Tablet     Tuesday: 1 & 1/2 Tablet     Wednesday: 1 Tablet     Thursday: 1 &  1/2 Tablet      Friday: 1 Tablet     Saturday: 1 & 1/2 Tablet Total Weekly: 45.0mg /week mg  Next INR Due: 12/09/2010 Adjusted by: Barbera Setters. Alexandria Lodge III PharmD CACP   Return to anticoagulation clinic:  12/09/2010 Time of next visit: 1030    Allergies: 1)  ! * Baking Soda 2)  ! * Three 6's Tonic 3)  ! * Epson's Salt

## 2010-11-20 NOTE — Assessment & Plan Note (Signed)
Summary: COU/CH  Anticoagulant Therapy Referring MD: Landis Martins PCP: Nelda Bucks DO [facility] Attending: Lowella Bandy MD Indication 1: TIA/CVA Indication 2: Encounter for therapeutic drug monitoring  V58.83 Start date: 06/05/2010  Patient Assessment Reviewed by: Chancy Milroy PharmD  November 12, 2010 Medication review: verified warfarin dosage & schedule,verified previous prescription medications, verified doses & any changes, verified new medications, reviewed OTC medications, reviewed OTC health products-vitamins supplements etc Complications: none Dietary changes: none   Health status changes: none   Lifestyle changes: none   Recent/future hospitalizations: none   Recent/future procedures: none   Recent/future dental: none Patient Assessment Part 2:  Have you MISSED ANY DOSES or CHANGED TABLETS?  No missed Warfarin doses or changed tablets.  Have you had any BRUISING or BLEEDING ( nose or gum bleeds,blood in urine or stool)?  No reported bruising or bleeding in nose, gums, urine, stool.  Have you STARTED or STOPPED any MEDICATIONS, including OTC meds,herbals or supplements?  No other medications or herbal supplements were started or stopped.  Have you CHANGED your DIET, especially green vegetables,or ALCOHOL intake?  No changes in diet or alcohol intake.  Have you had any ILLNESSES or HOSPITALIZATIONS?  No reported illnesses or hospitalizations  Have you had any signs of CLOTTING?(chest discomfort,dizziness,shortness of breath,arms tingling,slurred speech,swelling or redness in leg)    No chest discomfort, dizziness, shortness of breath, tingling in arm, slurred speech, swelling, or redness in leg.     Treatment  Target INR: 2.0-3.0 INR: 2.2  Date: 07/22/2010 Regimen In:  40.0mg /week INR reflects regimen in: 2.2  New  Tablet strength: : 5mg  Regimen Out:     Sunday: 1 Tablet     Monday: 1 & 1/2 Tablet     Tuesday: 1 Tablet     Wednesday: 1 Tablet     Thursday: 1 & 1/2  Tablet      Friday: 1 Tablet     Saturday: 1 Tablet Total Weekly: 40.0mg /week mg  Next INR Due: 08/05/2010 Adjusted by: Barbera Setters. Alexandria Lodge III PharmD CACP   Return to anticoagulation clinic:  08/05/2010 Time of next visit: 1100    Allergies: 1)  ! * Baking Soda 2)  ! * Three 6's Tonic 3)  ! * Epson's Salt

## 2010-11-26 ENCOUNTER — Other Ambulatory Visit: Payer: Self-pay | Admitting: Internal Medicine

## 2010-11-26 DIAGNOSIS — Z1231 Encounter for screening mammogram for malignant neoplasm of breast: Secondary | ICD-10-CM

## 2010-12-09 ENCOUNTER — Ambulatory Visit (INDEPENDENT_AMBULATORY_CARE_PROVIDER_SITE_OTHER): Payer: Medicare Other | Admitting: Pharmacist

## 2010-12-09 DIAGNOSIS — I6340 Cerebral infarction due to embolism of unspecified cerebral artery: Secondary | ICD-10-CM

## 2010-12-09 DIAGNOSIS — Z7901 Long term (current) use of anticoagulants: Secondary | ICD-10-CM

## 2010-12-09 NOTE — Patient Instructions (Signed)
Patient instructed to take medications as defined in the Anti-coagulation Track section of this encounter.  Patient instructed to take today's dose.  Patient verbalized understanding of these instructions.    

## 2010-12-09 NOTE — Progress Notes (Signed)
Anti-Coagulation Progress Note  [name] is a 77 y.o. female who is currently on an anti-coagulation regimen.    RECENT RESULTS: Recent results are below, the most recent result is correlated with a dose of 45 mg. per week: Lab Results  Component Value Date   INR 3.4 12/09/2010   INR 2.0 11/11/2010   INR 2.8 10/14/2010    ANTI-COAG DOSE:   Latest dosing instructions   Total Sun Mon Tue Wed Thu Fri Sat   42.5 5 mg 7.5 mg 5 mg 7.5 mg 5 mg 7.5 mg 5 mg    (5 mg1) (5 mg1.5) (5 mg1) (5 mg1.5) (5 mg1) (5 mg1.5) (5 mg1)         ANTICOAG SUMMARY: Anticoagulation Episode Summary              Current INR goal 2.0-3.0 Next INR check 01/06/2011   INR from last check 3.4! (12/09/2010)     Weekly max dose (mg)  Target end date    Indications CEREBRAL EMBOLISM, WITH INFARCTION, Long term current use of anticoagulant   [facility] check [facility]    Send INR reminders to [facility]   Comments        Provider Role Specialty Phone number   Blanch Media  Internal Medicine [phone]        ANTICOAG TODAY: Anticoagulation Summary as of 12/09/2010              INR goal 2.0-3.0     Selected INR 3.4! (12/09/2010) Next INR check 01/06/2011   Weekly max dose (mg)  Target end date    Indications CEREBRAL EMBOLISM, WITH INFARCTION, Long term current use of anticoagulant    Anticoagulation Episode Summary              INR check location [facility] Preferred lab    Send INR reminders to [facility]   Comments        Provider Role Specialty Phone number   Blanch Media  Internal Medicine [phone]        PATIENT INSTRUCTIONS: Patient Instructions  Patient instructed to take medications as defined in the Anti-coagulation Track section of this encounter.  Patient instructed to take today's dose.  Patient verbalized understanding of these instructions.        FOLLOW-UP Return in 4 weeks (on 01/06/2011) for Follow up INR.  Hulen Luster,  III Pharm.D., CACP

## 2010-12-12 ENCOUNTER — Other Ambulatory Visit: Payer: Self-pay | Admitting: Internal Medicine

## 2010-12-20 ENCOUNTER — Encounter: Payer: Self-pay | Admitting: Internal Medicine

## 2010-12-20 ENCOUNTER — Ambulatory Visit (INDEPENDENT_AMBULATORY_CARE_PROVIDER_SITE_OTHER): Payer: Medicare Other | Admitting: Internal Medicine

## 2010-12-20 DIAGNOSIS — E119 Type 2 diabetes mellitus without complications: Secondary | ICD-10-CM

## 2010-12-20 DIAGNOSIS — M810 Age-related osteoporosis without current pathological fracture: Secondary | ICD-10-CM

## 2010-12-20 DIAGNOSIS — E785 Hyperlipidemia, unspecified: Secondary | ICD-10-CM

## 2010-12-20 DIAGNOSIS — Z7901 Long term (current) use of anticoagulants: Secondary | ICD-10-CM

## 2010-12-20 DIAGNOSIS — K5900 Constipation, unspecified: Secondary | ICD-10-CM

## 2010-12-20 DIAGNOSIS — I509 Heart failure, unspecified: Secondary | ICD-10-CM

## 2010-12-20 DIAGNOSIS — I2510 Atherosclerotic heart disease of native coronary artery without angina pectoris: Secondary | ICD-10-CM | POA: Insufficient documentation

## 2010-12-20 DIAGNOSIS — N189 Chronic kidney disease, unspecified: Secondary | ICD-10-CM

## 2010-12-20 DIAGNOSIS — G4700 Insomnia, unspecified: Secondary | ICD-10-CM

## 2010-12-20 DIAGNOSIS — I10 Essential (primary) hypertension: Secondary | ICD-10-CM

## 2010-12-20 DIAGNOSIS — I6340 Cerebral infarction due to embolism of unspecified cerebral artery: Secondary | ICD-10-CM

## 2010-12-20 LAB — POCT GLYCOSYLATED HEMOGLOBIN (HGB A1C): Hemoglobin A1C: 13.6

## 2010-12-20 LAB — BASIC METABOLIC PANEL
Chloride: 102 mEq/L (ref 96–112)
Creat: 1.57 mg/dL — ABNORMAL HIGH (ref 0.40–1.20)
Potassium: 4.7 mEq/L (ref 3.5–5.3)
Sodium: 135 mEq/L (ref 135–145)

## 2010-12-20 MED ORDER — CALCIUM CARBONATE-VITAMIN D 500-200 MG-UNIT PO TABS: 1.0000 | ORAL_TABLET | Freq: Three times a day (TID) | ORAL | Status: DC

## 2010-12-20 MED ORDER — WARFARIN SODIUM 5 MG PO TABS: 5.0000 mg | ORAL_TABLET | ORAL | Status: DC

## 2010-12-20 MED ORDER — ROSUVASTATIN CALCIUM 20 MG PO TABS: 20.0000 mg | ORAL_TABLET | Freq: Every day | ORAL | Status: DC

## 2010-12-20 MED ORDER — ZOLPIDEM TARTRATE 5 MG PO TABS: 5.0000 mg | ORAL_TABLET | Freq: Every evening | ORAL | Status: DC | PRN

## 2010-12-20 MED ORDER — INSULIN GLARGINE 100 UNIT/ML ~~LOC~~ SOLN: 12.0000 [IU] | Freq: Every day | SUBCUTANEOUS | Status: DC

## 2010-12-20 MED ORDER — SENNOSIDES 8.6 MG PO TABS: 1.0000 | ORAL_TABLET | Freq: Every day | ORAL | Status: DC

## 2010-12-20 NOTE — Patient Instructions (Addendum)
Increase your Lantus to 12 units at night. Call me in 2 weeks to review your blood sugars so we can talk about increasing your Lantus more.  Be sure to tell me about any blood sugar measurements below 75.  Low blood sugar can be very dangerous. I will call you if any of your lab work is abnormal. Please call with any questions or concerns. I look forward to seeing you in 2-3 months!

## 2010-12-23 LAB — GLUCOSE, CAPILLARY: Glucose-Capillary: 431 mg/dL — ABNORMAL HIGH (ref 70–99)

## 2010-12-25 LAB — GLUCOSE, CAPILLARY: Glucose-Capillary: 256 mg/dL — ABNORMAL HIGH (ref 70–99)

## 2010-12-26 LAB — BASIC METABOLIC PANEL
BUN: 20 mg/dL (ref 6–23)
CO2: 26 mEq/L (ref 19–32)
Calcium: 8.1 mg/dL — ABNORMAL LOW (ref 8.4–10.5)
Chloride: 107 mEq/L (ref 96–112)
Creatinine, Ser: 2.82 mg/dL — ABNORMAL HIGH (ref 0.4–1.2)
GFR calc Af Amer: 20 mL/min — ABNORMAL LOW (ref >60)
GFR calc Af Amer: 27 mL/min — ABNORMAL LOW (ref >60)
GFR calc non Af Amer: 20 mL/min — ABNORMAL LOW (ref >60)
Glucose, Bld: 83 mg/dL (ref 70–99)
Potassium: 3.8 mEq/L (ref 3.5–5.1)
Potassium: 4 mEq/L (ref 3.5–5.1)
Sodium: 141 mEq/L (ref 135–145)

## 2010-12-26 LAB — PROTIME-INR
INR: 2.12 — ABNORMAL HIGH (ref 0.00–1.49)
INR: 2.25 — ABNORMAL HIGH (ref 0.00–1.49)
Prothrombin Time: 23.9 seconds — ABNORMAL HIGH (ref 11.6–15.2)
Prothrombin Time: 25 seconds — ABNORMAL HIGH (ref 11.6–15.2)

## 2010-12-26 LAB — DIFFERENTIAL
Basophils Relative: 0 % (ref 0–1)
Eosinophils Absolute: 0.1 10*3/uL (ref 0.0–0.7)
Eosinophils Relative: 2 % (ref 0–5)
Eosinophils Relative: 2 % (ref 0–5)
Lymphocytes Relative: 19 % (ref 12–46)
Lymphs Abs: 1.9 10*3/uL (ref 0.7–4.0)
Monocytes Absolute: 1.2 10*3/uL — ABNORMAL HIGH (ref 0.1–1.0)
Monocytes Relative: 12 % (ref 3–12)
Monocytes Relative: 7 % (ref 3–12)
Neutrophils Relative %: 67 % (ref 43–77)

## 2010-12-26 LAB — CBC
HCT: 25.4 % — ABNORMAL LOW (ref 36.0–46.0)
Hemoglobin: 8.4 g/dL — ABNORMAL LOW (ref 12.0–15.0)
Hemoglobin: 9.2 g/dL — ABNORMAL LOW (ref 12.0–15.0)
MCH: 25.8 pg — ABNORMAL LOW (ref 26.0–34.0)
MCV: 77.9 fL — ABNORMAL LOW (ref 78.0–100.0)
Platelets: 303 10*3/uL (ref 150–400)
RBC: 3.26 MIL/uL — ABNORMAL LOW (ref 3.87–5.11)
RBC: 3.55 MIL/uL — ABNORMAL LOW (ref 3.87–5.11)
WBC: 9.6 10*3/uL (ref 4.0–10.5)

## 2010-12-26 LAB — GLUCOSE, CAPILLARY
Glucose-Capillary: 215 mg/dL — ABNORMAL HIGH (ref 70–99)
Glucose-Capillary: 116 mg/dL — ABNORMAL HIGH (ref 70–99)
Glucose-Capillary: 118 mg/dL — ABNORMAL HIGH (ref 70–99)
Glucose-Capillary: 122 mg/dL — ABNORMAL HIGH (ref 70–99)
Glucose-Capillary: 124 mg/dL — ABNORMAL HIGH (ref 70–99)
Glucose-Capillary: 164 mg/dL — ABNORMAL HIGH (ref 70–99)
Glucose-Capillary: 168 mg/dL — ABNORMAL HIGH (ref 70–99)
Glucose-Capillary: 69 mg/dL — ABNORMAL LOW (ref 70–99)

## 2010-12-26 LAB — RENAL FUNCTION PANEL
Albumin: 1.8 g/dL — ABNORMAL LOW (ref 3.5–5.2)
Chloride: 108 mEq/L (ref 96–112)
GFR calc non Af Amer: 12 mL/min — ABNORMAL LOW (ref >60)
Phosphorus: 2.2 mg/dL — ABNORMAL LOW (ref 2.3–4.6)
Potassium: 3.3 mEq/L — ABNORMAL LOW (ref 3.5–5.1)
Sodium: 141 mEq/L (ref 135–145)

## 2010-12-27 LAB — GLUCOSE, CAPILLARY
Glucose-Capillary: 107 mg/dL — ABNORMAL HIGH (ref 70–99)
Glucose-Capillary: 116 mg/dL — ABNORMAL HIGH (ref 70–99)
Glucose-Capillary: 118 mg/dL — ABNORMAL HIGH (ref 70–99)
Glucose-Capillary: 129 mg/dL — ABNORMAL HIGH (ref 70–99)
Glucose-Capillary: 139 mg/dL — ABNORMAL HIGH (ref 70–99)
Glucose-Capillary: 150 mg/dL — ABNORMAL HIGH (ref 70–99)
Glucose-Capillary: 152 mg/dL — ABNORMAL HIGH (ref 70–99)
Glucose-Capillary: 157 mg/dL — ABNORMAL HIGH (ref 70–99)
Glucose-Capillary: 161 mg/dL — ABNORMAL HIGH (ref 70–99)
Glucose-Capillary: 168 mg/dL — ABNORMAL HIGH (ref 70–99)
Glucose-Capillary: 175 mg/dL — ABNORMAL HIGH (ref 70–99)
Glucose-Capillary: 176 mg/dL — ABNORMAL HIGH (ref 70–99)
Glucose-Capillary: 176 mg/dL — ABNORMAL HIGH (ref 70–99)
Glucose-Capillary: 183 mg/dL — ABNORMAL HIGH (ref 70–99)
Glucose-Capillary: 206 mg/dL — ABNORMAL HIGH (ref 70–99)
Glucose-Capillary: 215 mg/dL — ABNORMAL HIGH (ref 70–99)
Glucose-Capillary: 218 mg/dL — ABNORMAL HIGH (ref 70–99)
Glucose-Capillary: 228 mg/dL — ABNORMAL HIGH (ref 70–99)
Glucose-Capillary: 230 mg/dL — ABNORMAL HIGH (ref 70–99)
Glucose-Capillary: 242 mg/dL — ABNORMAL HIGH (ref 70–99)
Glucose-Capillary: 246 mg/dL — ABNORMAL HIGH (ref 70–99)
Glucose-Capillary: 256 mg/dL — ABNORMAL HIGH (ref 70–99)
Glucose-Capillary: 266 mg/dL — ABNORMAL HIGH (ref 70–99)
Glucose-Capillary: 301 mg/dL — ABNORMAL HIGH (ref 70–99)
Glucose-Capillary: 308 mg/dL — ABNORMAL HIGH (ref 70–99)
Glucose-Capillary: 309 mg/dL — ABNORMAL HIGH (ref 70–99)
Glucose-Capillary: 318 mg/dL — ABNORMAL HIGH (ref 70–99)
Glucose-Capillary: 324 mg/dL — ABNORMAL HIGH (ref 70–99)
Glucose-Capillary: 353 mg/dL — ABNORMAL HIGH (ref 70–99)
Glucose-Capillary: 41 mg/dL — CL (ref 70–99)
Glucose-Capillary: 481 mg/dL — ABNORMAL HIGH (ref 70–99)
Glucose-Capillary: 570 mg/dL (ref 70–99)
Glucose-Capillary: 592 mg/dL (ref 70–99)
Glucose-Capillary: 68 mg/dL — ABNORMAL LOW (ref 70–99)
Glucose-Capillary: 73 mg/dL (ref 70–99)
Glucose-Capillary: 87 mg/dL (ref 70–99)
Glucose-Capillary: >600 mg/dL (ref 70–99)

## 2010-12-27 LAB — BASIC METABOLIC PANEL
BUN: 10 mg/dL (ref 6–23)
BUN: 14 mg/dL (ref 6–23)
BUN: 45 mg/dL — ABNORMAL HIGH (ref 6–23)
BUN: 45 mg/dL — ABNORMAL HIGH (ref 6–23)
BUN: 46 mg/dL — ABNORMAL HIGH (ref 6–23)
BUN: 46 mg/dL — ABNORMAL HIGH (ref 6–23)
BUN: 47 mg/dL — ABNORMAL HIGH (ref 6–23)
BUN: 50 mg/dL — ABNORMAL HIGH (ref 6–23)
BUN: 53 mg/dL — ABNORMAL HIGH (ref 6–23)
CO2: 18 mEq/L — ABNORMAL LOW (ref 19–32)
CO2: 21 mEq/L (ref 19–32)
CO2: 22 mEq/L (ref 19–32)
CO2: 24 mEq/L (ref 19–32)
CO2: 31 mEq/L (ref 19–32)
Calcium: 6.9 mg/dL — ABNORMAL LOW (ref 8.4–10.5)
Calcium: 7.6 mg/dL — ABNORMAL LOW (ref 8.4–10.5)
Calcium: 8.1 mg/dL — ABNORMAL LOW (ref 8.4–10.5)
Calcium: 8.4 mg/dL (ref 8.4–10.5)
Calcium: 8.5 mg/dL (ref 8.4–10.5)
Calcium: 8.7 mg/dL (ref 8.4–10.5)
Calcium: 8.9 mg/dL (ref 8.4–10.5)
Calcium: 9 mg/dL (ref 8.4–10.5)
Calcium: 9.1 mg/dL (ref 8.4–10.5)
Chloride: 101 mEq/L (ref 96–112)
Chloride: 101 mEq/L (ref 96–112)
Chloride: 102 mEq/L (ref 96–112)
Chloride: 102 mEq/L (ref 96–112)
Chloride: 103 mEq/L (ref 96–112)
Chloride: 105 mEq/L (ref 96–112)
Chloride: 109 mEq/L (ref 96–112)
Chloride: 95 mEq/L — ABNORMAL LOW (ref 96–112)
Chloride: 97 mEq/L (ref 96–112)
Chloride: 99 mEq/L (ref 96–112)
Creatinine, Ser: 1.44 mg/dL — ABNORMAL HIGH (ref 0.4–1.2)
Creatinine, Ser: 4.15 mg/dL — ABNORMAL HIGH (ref 0.4–1.2)
Creatinine, Ser: 4.67 mg/dL — ABNORMAL HIGH (ref 0.4–1.2)
Creatinine, Ser: 4.79 mg/dL — ABNORMAL HIGH (ref 0.4–1.2)
Creatinine, Ser: 5.12 mg/dL — ABNORMAL HIGH (ref 0.4–1.2)
Creatinine, Ser: 5.24 mg/dL — ABNORMAL HIGH (ref 0.4–1.2)
Creatinine, Ser: 5.37 mg/dL — ABNORMAL HIGH (ref 0.4–1.2)
GFR calc Af Amer: 10 mL/min — ABNORMAL LOW (ref >60)
GFR calc Af Amer: 10 mL/min — ABNORMAL LOW (ref >60)
GFR calc Af Amer: 11 mL/min — ABNORMAL LOW (ref >60)
GFR calc Af Amer: 11 mL/min — ABNORMAL LOW (ref >60)
GFR calc Af Amer: 13 mL/min — ABNORMAL LOW (ref >60)
GFR calc Af Amer: 39 mL/min — ABNORMAL LOW (ref >60)
GFR calc Af Amer: 43 mL/min — ABNORMAL LOW (ref >60)
GFR calc Af Amer: 56 mL/min — ABNORMAL LOW (ref >60)
GFR calc Af Amer: 9 mL/min — ABNORMAL LOW (ref >60)
GFR calc Af Amer: 9 mL/min — ABNORMAL LOW (ref >60)
GFR calc Af Amer: >60 mL/min (ref >60)
GFR calc non Af Amer: 10 mL/min — ABNORMAL LOW (ref >60)
GFR calc non Af Amer: 32 mL/min — ABNORMAL LOW (ref >60)
GFR calc non Af Amer: 39 mL/min — ABNORMAL LOW (ref >60)
GFR calc non Af Amer: 51 mL/min — ABNORMAL LOW (ref >60)
GFR calc non Af Amer: 8 mL/min — ABNORMAL LOW (ref >60)
GFR calc non Af Amer: 8 mL/min — ABNORMAL LOW (ref >60)
GFR calc non Af Amer: 9 mL/min — ABNORMAL LOW (ref >60)
GFR calc non Af Amer: 9 mL/min — ABNORMAL LOW (ref >60)
GFR calc non Af Amer: 9 mL/min — ABNORMAL LOW (ref >60)
GFR calc non Af Amer: 9 mL/min — ABNORMAL LOW (ref >60)
Glucose, Bld: 120 mg/dL — ABNORMAL HIGH (ref 70–99)
Glucose, Bld: 146 mg/dL — ABNORMAL HIGH (ref 70–99)
Glucose, Bld: 248 mg/dL — ABNORMAL HIGH (ref 70–99)
Glucose, Bld: 280 mg/dL — ABNORMAL HIGH (ref 70–99)
Glucose, Bld: 280 mg/dL — ABNORMAL HIGH (ref 70–99)
Glucose, Bld: 291 mg/dL — ABNORMAL HIGH (ref 70–99)
Potassium: 3.7 mEq/L (ref 3.5–5.1)
Potassium: 3.7 mEq/L (ref 3.5–5.1)
Potassium: 3.9 mEq/L (ref 3.5–5.1)
Potassium: 4.2 mEq/L (ref 3.5–5.1)
Potassium: 4.2 mEq/L (ref 3.5–5.1)
Potassium: 4.4 mEq/L (ref 3.5–5.1)
Potassium: 4.6 mEq/L (ref 3.5–5.1)
Potassium: 4.7 mEq/L (ref 3.5–5.1)
Sodium: 126 mEq/L — ABNORMAL LOW (ref 135–145)
Sodium: 127 mEq/L — ABNORMAL LOW (ref 135–145)
Sodium: 129 mEq/L — ABNORMAL LOW (ref 135–145)
Sodium: 131 mEq/L — ABNORMAL LOW (ref 135–145)
Sodium: 133 mEq/L — ABNORMAL LOW (ref 135–145)
Sodium: 135 mEq/L (ref 135–145)
Sodium: 140 mEq/L (ref 135–145)
Sodium: 144 mEq/L (ref 135–145)

## 2010-12-27 LAB — COMPREHENSIVE METABOLIC PANEL
ALT: 17 U/L (ref 0–35)
AST: 31 U/L (ref 0–37)
Albumin: 3.2 g/dL — ABNORMAL LOW (ref 3.5–5.2)
CO2: 19 mEq/L (ref 19–32)
Calcium: 9.9 mg/dL (ref 8.4–10.5)
Chloride: 90 mEq/L — ABNORMAL LOW (ref 96–112)
GFR calc Af Amer: 11 mL/min — ABNORMAL LOW (ref >60)
GFR calc non Af Amer: 9 mL/min — ABNORMAL LOW (ref >60)
Sodium: 128 mEq/L — ABNORMAL LOW (ref 135–145)
Total Bilirubin: 0.7 mg/dL (ref 0.3–1.2)

## 2010-12-27 LAB — CBC
HCT: 32.6 % — ABNORMAL LOW (ref 36.0–46.0)
HCT: 33.3 % — ABNORMAL LOW (ref 36.0–46.0)
HCT: 33.3 % — ABNORMAL LOW (ref 36.0–46.0)
HCT: 38.2 % (ref 36.0–46.0)
Hemoglobin: 10 g/dL — ABNORMAL LOW (ref 12.0–15.0)
Hemoglobin: 10.5 g/dL — ABNORMAL LOW (ref 12.0–15.0)
Hemoglobin: 12.6 g/dL (ref 12.0–15.0)
MCH: 26.2 pg (ref 26.0–34.0)
MCH: 26.6 pg (ref 26.0–34.0)
MCH: 26.8 pg (ref 26.0–34.0)
MCHC: 31.5 g/dL (ref 30.0–36.0)
MCHC: 32.9 g/dL (ref 30.0–36.0)
MCHC: 33.4 g/dL (ref 30.0–36.0)
MCHC: 33.5 g/dL (ref 30.0–36.0)
MCHC: 34.4 g/dL (ref 30.0–36.0)
MCHC: 34.4 g/dL (ref 30.0–36.0)
MCV: 81.1 fL (ref 78.0–100.0)
Platelets: 225 10*3/uL (ref 150–400)
RBC: 3.74 MIL/uL — ABNORMAL LOW (ref 3.87–5.11)
RBC: 3.9 MIL/uL (ref 3.87–5.11)
RBC: 4.02 MIL/uL (ref 3.87–5.11)
RBC: 4.43 MIL/uL (ref 3.87–5.11)
RBC: 4.71 MIL/uL (ref 3.87–5.11)
RDW: 14.7 % (ref 11.5–15.5)
RDW: 14.9 % (ref 11.5–15.5)
RDW: 15.2 % (ref 11.5–15.5)
RDW: 15.5 % (ref 11.5–15.5)
WBC: 21.6 10*3/uL — ABNORMAL HIGH (ref 4.0–10.5)
WBC: 5.4 10*3/uL (ref 4.0–10.5)
WBC: 6 10*3/uL (ref 4.0–10.5)

## 2010-12-27 LAB — RENAL FUNCTION PANEL
Albumin: 1.9 g/dL — ABNORMAL LOW (ref 3.5–5.2)
BUN: 53 mg/dL — ABNORMAL HIGH (ref 6–23)
Calcium: 7.5 mg/dL — ABNORMAL LOW (ref 8.4–10.5)
Creatinine, Ser: 5.56 mg/dL — ABNORMAL HIGH (ref 0.4–1.2)
GFR calc Af Amer: 10 mL/min — ABNORMAL LOW (ref >60)
Glucose, Bld: 159 mg/dL — ABNORMAL HIGH (ref 70–99)
Phosphorus: 2.4 mg/dL (ref 2.3–4.6)
Phosphorus: 2.5 mg/dL (ref 2.3–4.6)
Potassium: 4.3 mEq/L (ref 3.5–5.1)
Sodium: 129 mEq/L — ABNORMAL LOW (ref 135–145)

## 2010-12-27 LAB — MRSA PCR SCREENING
MRSA by PCR: NEGATIVE
MRSA by PCR: NEGATIVE

## 2010-12-27 LAB — CARDIAC PANEL(CRET KIN+CKTOT+MB+TROPI)
CK, MB: 4.5 ng/mL — ABNORMAL HIGH (ref 0.3–4.0)
Relative Index: 0.9 (ref 0.0–2.5)
Troponin I: 0.08 ng/mL — ABNORMAL HIGH (ref 0.00–0.06)

## 2010-12-27 LAB — PROTIME-INR
INR: 1.03 (ref 0.00–1.49)
INR: 1.76 — ABNORMAL HIGH (ref 0.00–1.49)
INR: 1.98 — ABNORMAL HIGH (ref 0.00–1.49)
INR: 3.09 — ABNORMAL HIGH (ref 0.00–1.49)
Prothrombin Time: 20.7 seconds — ABNORMAL HIGH (ref 11.6–15.2)
Prothrombin Time: 22.7 seconds — ABNORMAL HIGH (ref 11.6–15.2)
Prothrombin Time: 31.9 seconds — ABNORMAL HIGH (ref 11.6–15.2)
Prothrombin Time: 32 seconds — ABNORMAL HIGH (ref 11.6–15.2)

## 2010-12-27 LAB — URINALYSIS, ROUTINE W REFLEX MICROSCOPIC
Bilirubin Urine: NEGATIVE
Bilirubin Urine: NEGATIVE
Glucose, UA: 250 mg/dL — AB
Ketones, ur: 15 mg/dL — AB
Ketones, ur: NEGATIVE mg/dL
Nitrite: NEGATIVE
Protein, ur: 100 mg/dL — AB
Protein, ur: NEGATIVE mg/dL
Specific Gravity, Urine: 1.006 (ref 1.005–1.030)
Specific Gravity, Urine: 1.01 (ref 1.005–1.030)
Urobilinogen, UA: 0.2 mg/dL (ref 0.0–1.0)
Urobilinogen, UA: 1 mg/dL (ref 0.0–1.0)
Urobilinogen, UA: 1 mg/dL (ref 0.0–1.0)
pH: 5 (ref 5.0–8.0)

## 2010-12-27 LAB — BRAIN NATRIURETIC PEPTIDE
Pro B Natriuretic peptide (BNP): 342 pg/mL — ABNORMAL HIGH (ref 0.0–100.0)
Pro B Natriuretic peptide (BNP): 433 pg/mL — ABNORMAL HIGH (ref 0.0–100.0)
Pro B Natriuretic peptide (BNP): 448 pg/mL — ABNORMAL HIGH (ref 0.0–100.0)
Pro B Natriuretic peptide (BNP): 598 pg/mL — ABNORMAL HIGH (ref 0.0–100.0)

## 2010-12-27 LAB — POCT I-STAT 3, ART BLOOD GAS (G3+)
O2 Saturation: 96 %
TCO2: 24 mmol/L (ref 0–100)
TCO2: 25 mmol/L (ref 0–100)
pCO2 arterial: 37.4 mmHg (ref 35.0–45.0)
pH, Arterial: 7.393 (ref 7.350–7.400)

## 2010-12-27 LAB — DIFFERENTIAL
Eosinophils Relative: 0 % (ref 0–5)
Lymphs Abs: 1.3 10*3/uL (ref 0.7–4.0)
Monocytes Absolute: 1.3 10*3/uL — ABNORMAL HIGH (ref 0.1–1.0)
Neutro Abs: 23.9 10*3/uL — ABNORMAL HIGH (ref 1.7–7.7)

## 2010-12-27 LAB — PROTEIN ELECTROPH W RFLX QUANT IMMUNOGLOBULINS
Alpha-1-Globulin: 10 % — ABNORMAL HIGH (ref 2.9–4.9)
Alpha-2-Globulin: 19.2 % — ABNORMAL HIGH (ref 7.1–11.8)
Beta 2: 8.8 % — ABNORMAL HIGH (ref 3.2–6.5)
Beta Globulin: 6.9 % (ref 4.7–7.2)
Gamma Globulin: 14.8 % (ref 11.1–18.8)
M-Spike, %: 0.32 g/dL

## 2010-12-27 LAB — URINE CULTURE
Culture  Setup Time: 201108311358
Culture: NO GROWTH

## 2010-12-27 LAB — URINE MICROSCOPIC-ADD ON

## 2010-12-27 LAB — UIFE/LIGHT CHAINS/TP QN, 24-HR UR
Alpha 1, Urine: DETECTED — AB
Alpha 2, Urine: DETECTED — AB
Free Kappa Lt Chains,Ur: 15.4 mg/dL — ABNORMAL HIGH (ref 0.04–1.51)
Total Protein, Urine: 22.3 mg/dL

## 2010-12-27 LAB — CULTURE, BLOOD (ROUTINE X 2): Culture: NO GROWTH

## 2010-12-27 LAB — IGG, IGA, IGM
IgA: 244 mg/dL (ref 68–378)
IgG (Immunoglobin G), Serum: 947 mg/dL (ref 694–1618)

## 2010-12-27 LAB — CORTISOL: Cortisol, Plasma: 50.4 ug/dL

## 2010-12-27 LAB — CK TOTAL AND CKMB (NOT AT ARMC): Relative Index: 1.3 (ref 0.0–2.5)

## 2010-12-27 LAB — POCT I-STAT 3, VENOUS BLOOD GAS (G3P V)
O2 Saturation: 58 %
TCO2: 28 mmol/L (ref 0–100)

## 2010-12-27 LAB — IMMUNOFIXATION ADD-ON

## 2010-12-27 LAB — TSH: TSH: 4.763 u[IU]/mL — ABNORMAL HIGH (ref 0.350–4.500)

## 2010-12-27 LAB — PHOSPHORUS: Phosphorus: 2.3 mg/dL (ref 2.3–4.6)

## 2010-12-27 LAB — APTT
aPTT: 41 seconds — ABNORMAL HIGH (ref 24–37)
aPTT: 52 seconds — ABNORMAL HIGH (ref 24–37)
aPTT: 61 seconds — ABNORMAL HIGH (ref 24–37)

## 2010-12-27 LAB — SODIUM, URINE, RANDOM: Sodium, Ur: <10 mEq/L

## 2010-12-27 LAB — C3 COMPLEMENT: C3 Complement: 109 mg/dL (ref 88–201)

## 2010-12-27 LAB — CLOSTRIDIUM DIFFICILE EIA

## 2010-12-27 NOTE — Assessment & Plan Note (Signed)
Ms. Hoggard hemoglobin A1c  has a significantly increased current 8.7  to 14.  Will slowly increase her Lantus to achieve improved control her her DM2.  I believe a reasonable A!c goal for her is 8 to avoid  life-threatenting hypoglycemia.  Will increase to 12u SQ and f/u with pt via phone in 2 weeks to review CBGs and likely make additional increases to her insulin.  Will refer pt to Jamison Neighbor for additional assistance managing her DM2.

## 2010-12-27 NOTE — Assessment & Plan Note (Addendum)
Stable.  WIll continue current regimen and check BMET today.

## 2010-12-27 NOTE — Assessment & Plan Note (Signed)
Pt is without any signs of CHF exacerbation.  Will f/u records from her upcoming visit with Dr. Clarene Duke

## 2010-12-27 NOTE — Progress Notes (Signed)
  Subjective:    Patient ID: [name], female    DOB: 1933/07/12, 77 y.o.   MRN: [id number]  HPI [name] is a very pleasant 77 year old female with past medical history outlined in the EMR who presents today for routine followup and medication refill.   #1 DM2:  patient is taking 10 units of Lantus each night.  She has her glucometer with her and reports persistently elevated CBGs over the past few months. Earlier this year she been having significant problems with nighttime hypoglycemia occuring between 10pm ans 12am  that was relatively asymptomatic.  At that time, her Lantus was decreased from 20 to 10u QHS.  Her Metformin was discontinued 2/2 renal failure following her hospitalization in August 2011.   She denies any continued hypoglycemix events but is concerned that her CBGs remained significantly elevated.  H theher CBG log report reveals blood glucose measurements ranging from the mid 200s to upper 400s throughout the day in am fasting as well as pre-and postprandial readings.   #2 CHF [name] follows with Dr. Clarene Duke. She denies any shortness of breath or increasing lower extremity edema. She reports 100% compliance with her current medication regimen and has an upcoming appointment with Dr. Clarene Duke next week. She denies chest pain palpitations syncope dizziness or other complaint.   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 She is doing well with Coumadin and is continuing to follow with Dr. Alexandria Lodge. She denies any falls, shortness of breath or abnormal bleeding.   [name] has no other concerns or complaints today.   Review of Systems   12 point review of systems was completed and as outlined in history of present illness otherwise negative     Objective:   Physical  Exam  Constitutional: She is oriented to person, place, and time. She appears well-developed and well-nourished. No distress.  HENT:  Head: Normocephalic and atraumatic.  Mouth/Throat: Oropharynx is clear and moist. No oropharyngeal exudate.  Eyes: Conjunctivae are normal. Pupils are equal, round, and reactive to light. Right eye exhibits no discharge. Left eye exhibits no discharge. No scleral icterus.  Neck: Normal range of motion. Neck supple. No JVD present. No tracheal deviation present. No thyromegaly present.  Cardiovascular: Normal rate, regular rhythm and intact distal pulses.  Exam reveals no gallop and no friction rub.   Pulmonary/Chest: Effort normal and breath sounds normal. No stridor. No respiratory distress. She has no wheezes. She has no rales.  Abdominal: Soft. Bowel sounds are normal. She exhibits no distension and no mass. There is no tenderness. There is no rebound and no guarding.  Musculoskeletal: She exhibits no edema and no tenderness.  Lymphadenopathy:    She has no cervical adenopathy.  Neurological: She is alert and oriented to person, place, and time. No cranial nerve deficit.  Skin: Skin is warm and dry. No rash noted. She is not diaphoretic. No erythema.  Psychiatric: She has a normal mood and affect. Her behavior is normal. Judgment and thought content normal.          Assessment & Plan:

## 2010-12-27 NOTE — Assessment & Plan Note (Addendum)
Will check CMET today to assess renal function.

## 2010-12-28 LAB — CBC
HCT: 32.3 % — ABNORMAL LOW (ref 36.0–46.0)
HCT: 36.4 % (ref 36.0–46.0)
HCT: 36.6 % (ref 36.0–46.0)
Hemoglobin: 10.4 g/dL — ABNORMAL LOW (ref 12.0–15.0)
MCH: 28.2 pg (ref 26.0–34.0)
MCH: 28.4 pg (ref 26.0–34.0)
MCHC: 32.2 g/dL (ref 30.0–36.0)
MCHC: 32.6 g/dL (ref 30.0–36.0)
MCHC: 32.8 g/dL (ref 30.0–36.0)
MCV: 86 fL (ref 78.0–100.0)
MCV: 86.4 fL (ref 78.0–100.0)
Platelets: 239 10*3/uL (ref 150–400)
Platelets: 239 10*3/uL (ref 150–400)
RBC: 4.2 MIL/uL (ref 3.87–5.11)
RDW: 15 % (ref 11.5–15.5)
RDW: 15.2 % (ref 11.5–15.5)

## 2010-12-28 LAB — BASIC METABOLIC PANEL
BUN: 10 mg/dL (ref 6–23)
BUN: 10 mg/dL (ref 6–23)
CO2: 28 mEq/L (ref 19–32)
CO2: 32 mEq/L (ref 19–32)
Chloride: 103 mEq/L (ref 96–112)
Chloride: 105 mEq/L (ref 96–112)
Creatinine, Ser: 1.27 mg/dL — ABNORMAL HIGH (ref 0.4–1.2)
Creatinine, Ser: 1.28 mg/dL — ABNORMAL HIGH (ref 0.4–1.2)
GFR calc Af Amer: 50 mL/min — ABNORMAL LOW (ref >60)
GFR calc non Af Amer: 41 mL/min — ABNORMAL LOW (ref >60)
Glucose, Bld: 163 mg/dL — ABNORMAL HIGH (ref 70–99)
Glucose, Bld: 210 mg/dL — ABNORMAL HIGH (ref 70–99)
Potassium: 3.8 mEq/L (ref 3.5–5.1)

## 2010-12-28 LAB — URINALYSIS, ROUTINE W REFLEX MICROSCOPIC
Bilirubin Urine: NEGATIVE
Hgb urine dipstick: NEGATIVE
Ketones, ur: 15 mg/dL — AB
Nitrite: NEGATIVE
Urobilinogen, UA: 1 mg/dL (ref 0.0–1.0)

## 2010-12-28 LAB — BRAIN NATRIURETIC PEPTIDE: Pro B Natriuretic peptide (BNP): 2447 pg/mL — ABNORMAL HIGH (ref 0.0–100.0)

## 2010-12-28 LAB — COMPREHENSIVE METABOLIC PANEL
AST: 25 U/L (ref 0–37)
Albumin: 3.1 g/dL — ABNORMAL LOW (ref 3.5–5.2)
Alkaline Phosphatase: 99 U/L (ref 39–117)
BUN: 10 mg/dL (ref 6–23)
CO2: 28 mEq/L (ref 19–32)
Calcium: 8.6 mg/dL (ref 8.4–10.5)
Chloride: 103 mEq/L (ref 96–112)
Creatinine, Ser: 1.01 mg/dL (ref 0.4–1.2)
GFR calc Af Amer: >60 mL/min (ref >60)
GFR calc non Af Amer: 54 mL/min — ABNORMAL LOW (ref >60)
Glucose, Bld: 242 mg/dL — ABNORMAL HIGH (ref 70–99)

## 2010-12-28 LAB — LIPID PANEL
Cholesterol: 132 mg/dL (ref 0–200)
LDL Cholesterol: 68 mg/dL (ref 0–99)
Total CHOL/HDL Ratio: 2.4 RATIO

## 2010-12-28 LAB — HEPATIC FUNCTION PANEL
Bilirubin, Direct: 0.2 mg/dL (ref 0.0–0.3)
Indirect Bilirubin: 0.7 mg/dL (ref 0.3–0.9)

## 2010-12-28 LAB — POCT CARDIAC MARKERS
CKMB, poc: 1.9 ng/mL (ref 1.0–8.0)
Troponin i, poc: <0.05 ng/mL (ref 0.00–0.09)

## 2010-12-28 LAB — DIFFERENTIAL
Basophils Absolute: 0 10*3/uL (ref 0.0–0.1)
Basophils Relative: 1 % (ref 0–1)
Eosinophils Absolute: 0 10*3/uL (ref 0.0–0.7)
Eosinophils Relative: 0 % (ref 0–5)
Monocytes Absolute: 0.4 10*3/uL (ref 0.1–1.0)
Neutro Abs: 3.1 10*3/uL (ref 1.7–7.7)

## 2010-12-28 LAB — LIPASE, BLOOD: Lipase: 30 U/L (ref 11–59)

## 2010-12-28 LAB — PROTIME-INR
INR: 1.11 (ref 0.00–1.49)
Prothrombin Time: 14.2 seconds (ref 11.6–15.2)

## 2010-12-28 LAB — POCT I-STAT, CHEM 8
Calcium, Ion: 1.09 mmol/L — ABNORMAL LOW (ref 1.12–1.32)
Glucose, Bld: 402 mg/dL — ABNORMAL HIGH (ref 70–99)
HCT: 41 % (ref 36.0–46.0)
Hemoglobin: 13.9 g/dL (ref 12.0–15.0)
TCO2: 22 mmol/L (ref 0–100)

## 2010-12-28 LAB — HEMOGLOBIN A1C: Mean Plasma Glucose: 232 mg/dL — ABNORMAL HIGH (ref <117)

## 2010-12-28 LAB — GLUCOSE, CAPILLARY: Glucose-Capillary: 235 mg/dL — ABNORMAL HIGH (ref 70–99)

## 2010-12-28 LAB — MAGNESIUM: Magnesium: 1.7 mg/dL (ref 1.5–2.5)

## 2010-12-28 LAB — URINE MICROSCOPIC-ADD ON

## 2010-12-28 LAB — TSH: TSH: 3.054 u[IU]/mL (ref 0.350–4.500)

## 2010-12-30 ENCOUNTER — Ambulatory Visit (HOSPITAL_COMMUNITY): Payer: Medicare Other

## 2011-01-02 ENCOUNTER — Ambulatory Visit (HOSPITAL_COMMUNITY)
Admission: RE | Admit: 2011-01-02 | Discharge: 2011-01-02 | Disposition: A | Discharge status: Home / Self Care | Payer: Medicare Other | Source: Ambulatory Visit | Attending: Family Medicine | Admitting: Family Medicine

## 2011-01-02 DIAGNOSIS — Z1231 Encounter for screening mammogram for malignant neoplasm of breast: Secondary | ICD-10-CM | POA: Insufficient documentation

## 2011-01-06 ENCOUNTER — Ambulatory Visit (INDEPENDENT_AMBULATORY_CARE_PROVIDER_SITE_OTHER): Payer: Medicare Other | Admitting: Pharmacist

## 2011-01-06 DIAGNOSIS — I6340 Cerebral infarction due to embolism of unspecified cerebral artery: Secondary | ICD-10-CM

## 2011-01-06 DIAGNOSIS — Z7901 Long term (current) use of anticoagulants: Secondary | ICD-10-CM

## 2011-01-06 NOTE — Patient Instructions (Signed)
Patient instructed to take medications as defined in the Anti-coagulation Track section of this encounter.  Patient instructed to take today's dose.  Patient verbalized understanding of these instructions.    

## 2011-01-06 NOTE — Progress Notes (Signed)
Anti-Coagulation Progress Note  [name] is a 77 y.o. female who is currently on an anti-coagulation regimen.    RECENT RESULTS: Recent results are below, the most recent result is correlated with a dose of 42.5 mg. per week: Lab Results  Component Value Date   INR 2.2 01/06/2011   INR 3.4 12/09/2010   INR 2.0 11/11/2010    ANTI-COAG DOSE:   Latest dosing instructions   Total Sun Mon Tue Wed Thu Fri Sat   42.5 5 mg 7.5 mg 5 mg 7.5 mg 5 mg 7.5 mg 5 mg    (5 mg1) (5 mg1.5) (5 mg1) (5 mg1.5) (5 mg1) (5 mg1.5) (5 mg1)         ANTICOAG SUMMARY: Anticoagulation Episode Summary              Current INR goal 2.0-3.0 Next INR check 02/03/2011   INR from last check 2.2 (01/06/2011)     Weekly max dose (mg)  Target end date Indefinite   Indications CEREBRAL EMBOLISM, WITH INFARCTION, Long term current use of anticoagulant   [facility] check location [facility] Preferred lab    Send INR reminders to [facility]   Comments        Provider Role Specialty Phone number   Blanch Media  Internal Medicine [phone]        ANTICOAG TODAY: Anticoagulation Summary as of 01/06/2011              INR goal 2.0-3.0     Selected INR 2.2 (01/06/2011) Next INR check 02/03/2011   Weekly max dose (mg)  Target end date Indefinite   Indications CEREBRAL EMBOLISM, WITH INFARCTION, Long term current use of anticoagulant    Anticoagulation Episode Summary              INR check location [facility] Preferred lab    Send INR reminders to [facility]   Comments        Provider Role Specialty Phone number   Blanch Media  Internal Medicine [phone]        PATIENT INSTRUCTIONS: Patient Instructions  Patient instructed to take medications as defined in the Anti-coagulation Track section of this encounter.  Patient instructed to take today's dose.  Patient verbalized understanding of these instructions.        FOLLOW-UP Return in 4 weeks (on 02/03/2011) for Follow up  INR.  Hulen Luster, III Pharm.D., CACP

## 2011-01-17 LAB — GLUCOSE, CAPILLARY: Glucose-Capillary: 286 mg/dL — ABNORMAL HIGH (ref 70–99)

## 2011-01-21 LAB — GLUCOSE, CAPILLARY: Glucose-Capillary: 225 mg/dL — ABNORMAL HIGH (ref 70–99)

## 2011-01-28 LAB — GLUCOSE, CAPILLARY: Glucose-Capillary: 238 mg/dL — ABNORMAL HIGH (ref 70–99)

## 2011-02-03 ENCOUNTER — Ambulatory Visit (INDEPENDENT_AMBULATORY_CARE_PROVIDER_SITE_OTHER): Payer: Medicare Other | Admitting: Pharmacist

## 2011-02-03 DIAGNOSIS — I6340 Cerebral infarction due to embolism of unspecified cerebral artery: Secondary | ICD-10-CM

## 2011-02-03 DIAGNOSIS — Z7901 Long term (current) use of anticoagulants: Secondary | ICD-10-CM

## 2011-02-03 NOTE — Patient Instructions (Signed)
Patient instructed to take medications as defined in the Anti-coagulation Track section of this encounter.  Patient instructed to take today's dose.  Patient verbalized understanding of these instructions.    

## 2011-02-03 NOTE — Progress Notes (Signed)
Anti-Coagulation Progress Note  [name] is a 77 y.o. female who is currently on an anti-coagulation regimen.    RECENT RESULTS: Recent results are below, the most recent result is correlated with a dose of 42.5 mg. per week: Lab Results  Component Value Date   INR 2.5 02/03/2011   INR 2.2 01/06/2011   INR 3.4 12/09/2010    ANTI-COAG DOSE:   Latest dosing instructions   Total Sun Mon Tue Wed Thu Fri Sat   42.5 5 mg 7.5 mg 5 mg 7.5 mg 5 mg 7.5 mg 5 mg    (5 mg1) (5 mg1.5) (5 mg1) (5 mg1.5) (5 mg1) (5 mg1.5) (5 mg1)         ANTICOAG SUMMARY: Anticoagulation Episode Summary              Current INR goal 2.0-3.0 Next INR check 03/03/2011   INR from last check 2.5 (02/03/2011)     Weekly max dose (mg)  Target end date Indefinite   Indications CEREBRAL EMBOLISM, WITH INFARCTION, Long term current use of anticoagulant   [facility] check location [facility] Preferred [facility]    Send INR reminders to [facility]   Comments        Provider Role Specialty Phone number   Blanch Media  Internal Medicine [phone]        ANTICOAG TODAY: Anticoagulation Summary as of 02/03/2011              INR goal 2.0-3.0     Selected INR 2.5 (02/03/2011) Next INR check 03/03/2011   Weekly max dose (mg)  Target end date Indefinite   Indications CEREBRAL EMBOLISM, WITH INFARCTION, Long term current use of anticoagulant    Anticoagulation Episode Summary              INR check location [facility] Preferred lab    Send INR reminders to [facility]   Comments        Provider Role Specialty Phone number   Blanch Media  Internal Medicine [phone]        PATIENT INSTRUCTIONS: Patient Instructions  Patient instructed to take medications as defined in the Anti-coagulation Track section of this encounter.  Patient instructed to take today's dose.  Patient verbalized understanding of these instructions.        FOLLOW-UP Return in 4 weeks (on 03/03/2011) for Follow up  INR.  Hulen Luster, III Pharm.D., CACP

## 2011-02-25 NOTE — Discharge Summary (Signed)
[name], [name]                 ACCOUNT NO.:  [id number]   MEDICAL RECORD NO.:  [id number]          PATIENT TYPE:  INP   LOCATION:  4739                         FACILITY:  [facility]   PHYSICIAN:  Fransisco Hertz, M.D.  DATE OF BIRTH:  January 01, 1933   DATE OF ADMISSION:  07/10/2008  DATE OF DISCHARGE:  07/13/2008                               DISCHARGE SUMMARY   DISCHARGE DIAGNOSES:  1. Chest pain, likely due to stable angina.  2. Pneumonia.  3. Abdominal pain with diarrhea, likely secondary to viral      gastroenteritis.  4. Hypokalemia.  5. Hypertension.  6. Congestive heart failure.  7. Type 2 diabetes.  8. Gastroesophageal reflux disease.  9. Obstructive sleep apnea.  10.Hyperlipidemia.  11.Asthma.  12.Anemia.  13.Peripheral neuropathy.  14.Allergic rhinitis.   DISCHARGE MEDICATIONS:  1. Avelox 400 mg p.o. daily for 5 days.  2. Nitroglycerin 0.4 mg sublingual p.r.n. for chest pain.  3. Loratadine 10 mg p.o. daily.  4. Klor-Con 20 mEq p.o. daily.  5. Ranitidine 75 mg p.o. daily.  6. Imdur 60 mg p.o. daily.  7. Lasix 120 mg, p.o. daily.  8. Lipitor 10 mg p.o. daily.  9. Aspirin 81 mg p.o. daily.  10.Fosamax 75 mg p.o. daily.  11.Lisinopril 40 mg p.o. daily.  12.Albuterol 90 mcg MDI 1-2 puffs p.r.n. for shortness of breath.  13.Serevent Diskus 50 mcg 1 puff daily.  14.Actos 45 mg p.o. daily.  15.Diltiazem 120 mg, p.o. b.i.d.  16.Lantus 35 units at bedtime.  17.Metformin 500 mg p.o. b.i.d.  18.Senokot 8.6 mg, take 1-2 tablets p.o. daily for constipation.  19.Cyclobenzaprine 5 mg p.o. q.8 h. p.r.n. for shoulder pain.   DISPOSITION AND FOLLOWUP:  [name] is being discharged from the  hospital on July 13, 2008, in stable and improved condition.  Now, her  chest pain and abdominal pain had resolved.  [facility] will [facility] with Dr. Reynold Bowen on July 24, 2008, at 2:50 p.m.  At that time, she will need to have regular  primary  care followup including lung, heart, and abdominal exam.  Check her  blood pressure, CBG, CBC, and BMET. Check her lipid panel and liver  function in 3-5 months.   CONSULTATION:  Thereasa Solo. Little, MD of Cardiology.   PROCEDURES PERFORMED:  1. Chest x-ray on July 10, 2008, shows patchy density in the      right lower lobe suspecting pneumonia.  2. Abdominal x-ray on July 10, 2008, shows nonobstructive bowel      gas pattern.  3. A 2-D echocardiogram on July 11, 2008, shows overall left      ventricular systolic function was mildly decreased, ejection      fraction was 40-50%.  There was diffuse left ventricular      hypokinesis.  Left ventricular wall thickness was mildly-to-      moderately increased and there was mild mitral valvular      regurgitation.  4. Doppler of bilateral legs on July 12, 2008, shows no evidence      of deep  vein thrombosis or superficial thrombosis.   ADMISSION HISTORY:  [name] is a 77 year old woman with past medical  history of recurrent chest pain, CHF, hypertension, diabetes, asthma,  GERD, obstructive sleep apnea, and obesity.  She came to [facility]  Emer[facility] Department complaining of chest pain and abdominal pain with  diarrhea.  About 5 days before admission, the patient started to have  generalized dull abdominal pain, graded 3-4/10, constant, like crampy,  nothing made it change.  She had no nausea and vomiting, but had watery  diarrhea, no blood or mucus in stool.  Three days before the admission,  she started to have chill, fever, sore throat, cough with mild greenish  sputum, and right chest pain.  She took the temperature and the highest  was 105 degrees.  Her chest pain was intermittent, lasting about 5-10  minutes, graded 10/10 and radiating to the left shoulder and arm with  nausea and vomiting.  She also had sweating and palpitation.  Walking  would make her chest pain worse and rest made it better and deep  breath  did not make any change to the chest pain.  Her chest pain resolved  shortly after she took nitroglycerin.  The patient had about four  similar episodes of the chest pain in the past 3 days before admission,  which was similar to her previous chest pain.  So, she came to the ED  for further evaluation and treatment.  She also had a chronic shortness  of breath and she slept with 2 pillows at night.  She had sick contact  with her daughter who had cold-like symptoms about 3 weeks ago.  She did  not have dysuria.  Denies use of any alcohol or drug before this  admission.   ADMISSION PHYSICAL EXAMINATION:  VITAL SIGNS:  Temperature 98.9, blood  pressure 210/89, pulse 86, respiratory rate 20, and oxygen saturation  98% on room air.  GENERAL:  The patient was in no acute distress and speak in full  sentence.  EYES:  Pupils are equal, round, and reactive to light.  Extraocular  movements are intact.  NECK:  Supple.  No thyroid enlargement.  No JVD.  LUNGS:  Clear to auscultation bilaterally.  No wheezing or crackles.  HEART:  Regular rate and rhythm.  No murmur.  ABDOMEN:  Soft, mild periumbilical tenderness.  No rebound tenderness.  Bowel sounds positive.  No masses palpated.  EXTREMITIES:  Left foot pitting edema 1+.  NEUROLOGIC:  Alert and oriented x3.  Cranial nerves II through XII  grossly intact.  Muscle strength 5/5.  Sensation is intact and soft to  touch.  PSYCHIATRY:  Appropriate.   ADMISSION LABORATORY DATA:  CBC; WBC 5.6, hemoglobin 13.8, and platelet  237.  Sodium 140, potassium 3, chloride 100, CO2 27, BUN 8, creatinine  0.94, and blood glucose 293.  BNP 181.  Lipase 18.  HbA1c 10.6.  UA:  glucose greater than 1000, protein 100, ketones 40, and WBC 0-2.   HOSPITAL COURSE PROBLEM BY PROBLEM:  1. Chest pain, likely due to stable angina.  The patient has history      of recurrent chest pain with past medical history of hypertension,      CHF, diabetes and obesity.  She developed chest pain after she had      abdominal pain and diarrhea with fever.  Chest pain was exertional      and radiating to the shoulder and arm, resolved with nitroglycerin  treatment.  Her EKG on the admission did not show significant      ischemic change and the cyclic cardiac enzymes were all negative      x3.  So, this is mostly like due to stable angina and myocardial      infarction was less unlikely.  We treated the patient with aspirin,      nitroglycerin, morphine, her chest pain resolved on the second day      of admission.  The patient had negative stress test in 2007, but we      were concerned with her recurent chest pain.  She may have severe      coronary artery disease and needs further evaluation such as      cardiac catheterization or stress test.  So, we asked the      Cardiology consult and they had came to see the patient, and they      thought that this may be due to the stable angina or lung infection      and recommended to follow up at outpatient and may need to do the      Myoview stress test or catheterization if her chest pain has more      frequent recurrence or become worse.  After discharge, the patient      will need to continue to take aspirin, nitroglycerin as needed for      chest pain as well as to control her hypertension, CHF, diabetes,      and hyperlipidemia to reduce the risk of recurrence.  2. Pneumonia.  The patient has history of recent sick contact and      started to have chill, fever, sore throat, cough with mild greenish      sputum.  Chest x-ray shows patchy densities in the right lower lobe      suspecting pneumonia, but WBC was normal.  We felt that she may      have got community-acquired pneumonia or viral upper respiratory      infection such as H1N1 flu, so we put the patient on droplet      precaution and treated her with azithromycin and Rocephin as well      as Tamiflu for 3 days and was then switched to Avelox after 2  days      of afebrile period.  Her blood pressure was negative x2.  After      discharge, the patient needs to continue Avelox for 5 more days and      needs to have a checkup for her lung with a CBC in the next      outpatient visit.  3. Abdominal pain with diarrhea, likely due to the viral      gastroenteritis.  The patient has a history of recent sick contact      and started to have generalized abdominal pain with watery      diarrhea, no blood or mucus.  Her physical exam shows a soft      abdomen, mild periumbilical tenderness, no rebound tenderness,      bowel sounds positive.  Abdominal x-ray did not show any      obstruction or acute abnormalities.  Her lipase was normal, so      bowel obstruction or pancreatitis were less likely.  The patient      has no recent hospitalization or use of antibiotics, so the C. Diff      associated colitis was also less likely. So  we thought that her      symptoms was likely from viral gastroenteritis.  During      hospitalization, these completely resolved on the next day of      admission while we treated her with antibiotics and Tamiflu for      possible pulmonary infection.  The patient will need to follow up      on the outpatient.  4. Hypokalemia.  This is likely due to the diarrhea and use of Lasix.      We repleted with KCl and hypokalemia resolved.  Need to check her      BMET for potassium in outpatient clinic visit.  5. Hypertension.  On admission, her blood pressure was 210/89, so we      treated this with nitroglycerin drip as well as lisinopril and      diltiazem.  Her blood pressure was under control,  ranging 142-      154/71-85 during the hospitalization.  After discharge, the patient      will need to continue to take the lisinopril and diltiazem.  6. Congestive heart failure.  She has chronic exertional shortness of      breath with orthopnea and 2-D echo shows left ventricular EF was 40-      50%, which was better than 3  years ago.  The patient will need to      continue diuresis with Lasix and control her blood pressure with      lisinopril and diltiazem, and she will have follow up by her      Cardiology doctor.  7. Type 2 diabetes.  On admission, her HbA1c was 10.6, indicating      diabetes was not well controlled.  During hospitalization, we      discontinued her oral medication and treated her with Lantus with      sliding scale and her CBG went 134-420.  After discharge, the      patient needs to continue home medications Actos and metformin as      well as Lantus 35 units and needs to follow up her CBG and adjust      the medication dosing if her CBG is not well controlled.  8. GERD.  Continue to take ranitidine.  9. Hyperlipidemia.  Her lipid had good control and will continue to      take Lipitor after discharge.  10.Asthma.  During hospitalization, the patient has no asthma attack      or exacerbation and will continue to take bronchodilators after      discharge.   DISCHARGE VITALS:  Temperature 98.6, blood pressure 146/83, heart rate  88, respiratory rate 20, and O2 sat 99% on room air.   DISCHARGE LABORATORY DATA:  CBC; WBC 5.4, hemoglobin 12.2, and platelet  207.  Sodium 136, potassium 3.7, chloride 103, CO2 of 28, BUN 15,  creatinine 1.05, blood glucose 222, calcium 8.9, magnesium 1.8, and  phosphate 3.3.  Triglycerides 61, HDL 66, LDL 101, VLDL 12, and HbA1c  10.6.  TSH 2.128. Blood culture (-) x2.      Jackson Latino, MD  Electronically Signed      Fransisco Hertz, M.D.  Electronically Signed    ZY/MEDQ  D:  07/20/2008  T:  07/21/2008  Job:  [id number]   cc:   Thereasa Solo. Little, M.D.

## 2011-02-28 NOTE — Discharge Summary (Signed)
[name], Hurley                 ACCOUNT NO.:  [id number]   MEDICAL RECORD NO.:  [id number]          PATIENT TYPE:  INP   LOCATION:  2006                         FACILITY:  [facility]   PHYSICIAN:  Hollace Hayward, M.D.   DATE OF BIRTH:  09/28/1933   DATE OF ADMISSION:  07/24/2006  DATE OF DISCHARGE:  07/25/2006                                 DISCHARGE SUMMARY   CONSULTATIONS:  [facility] with primary cardiologist Dr. Clarene Duke.   DISCHARGE DIAGNOSES:  1. Chest pain, atypical.  2. Headache.  3. Hypertension.  4. Congestive heart failure.  5. Diabetes mellitus type 2.  6. Hyperlipidemia.  7. Chronic renal failure.  8. Osteopenia.  9. Obstructive sleep apnea.   DISCHARGE MEDICATIONS:  1. Loratadine 10 mg daily.  2. Metoprolol ER 25 mg daily.  3. NovoLog 70/30 40 units in the morning, 20 units at bed time.  4. Oxybutynin 5 mg t.i.d.  5. Klor-Con 20 mEq daily.  6. Ranitidine 150 mg b.i.d.  7. Imdur once daily.  8. Lasix 120 mg daily.  9. Lipitor 10 mg daily.  10.Aspirin 81 mg daily.  11.Fosamax 35 mg daily.  12.Serevent diskus 50 mcg daily.  13.Amitriptyline 55 mg at bed time.  14.Albuterol 2 puffs inhaled q.4 hours p.r.n.  15.Nitroglycerin 0.4 mg 1 sublingual p.r.n.  16.Lisinopril 60 mg daily.   DISPOSITION AND FOLLOWUP:  1. The patient will be called by Dr. Fredirick Maudlin office from [facility]      [facility].  They will setup an appointment for this patient to follow      up with them next week for outpatient stress testing.  2. Patient will follow up with primary care physician as needed.   PROCEDURES:  1. July 24, 2006 chest x-ray showing cardiomegaly with no acute      findings.  2. July 24, 2006 head CT showing no acute intracranial abnormalities.  3. Echo, July 25, 2006, results pending.   HISTORY AND PHYSICAL:  [name] is a 77 year old African American female  with a past medical history of hypertension, diabetes, congestive heart  failure,  hyperlipidemia, and obstructive sleep apnea who presents with  several episodes of chest pain that resolves with nitroglycerin.  The  patient had 1 episode of left-sided chest pain that radiated down her left  arm last Sunday while sitting in church.  The pain resolved with  nitroglycerin.  She had diaphoresis with the episode, but no nausea or  vomiting.  Patient had another episode the following day that resolved with  nitroglycerin.  The patient has not had any anginal episodes for several  days.  The patient has a history of angina, although she has not been  catheterized and has no known MIs.  She had a Cardiolite study in 2004 that  showed no ischemia with an ejection fraction of 46%.  In September 2006, she  had an echocardiogram that showed an ejection fraction of 35% to 40% with  diffuse left ventricular hypokinesis.  On admission to the emergency room,  the patient was also complaining of a frontal headache that started on  Monday.  She stated that the pressure was somewhat sinus-like, it was helped  with Tylenol but was still present.  She also described 1 episode of  transient blurry vision that resolved when she woke from a nap.  The patient  was also complaining of a subjective sensation of left-sided weakness  sometime during the past week, though she could not clarify when.  She said  it resolved quickly on its own and she had no gait abnormalities.  She had  no aphasia, but had the sensation of transient weakness.  Patient was also  complaining of fevers and chills, subjectively, early in the week as well as  postnasal drip and a cough.  At one time she had a few dark specks of brown  material in the phlegm, though she denied hematemesis.  She also has a  baseline shortness of breath.  The shortness of breath is unchanged.  She  reports that she has chronic lower extremity edema and it may have worsened  slightly in the past week, although is not swollen today.  Her  granddaughter  has had a viral illness during the past week.  She has no other sick  contacts.  She also describes suprapubic discomfort, though denies any  dysuria or hematuria.  She has baseline frequency that is unchanged.   ADMISSION PHYSICAL EXAMINATION:  VITAL SIGNS:  Temperature 97.1, blood  pressure 163/80, pulse 62, respirations 18, oxygen sat 95% on room air.  GENERAL:  The patient is lying in the clinic bed in no acute distress.  EYES:  Pupils are equal, round, and reactive to light and accommodation.  Extraocular muscles are intact, anicteric.  ENT:  Oropharynx clear.  NECK:  Unable to appreciate JVD secondary to body habitus.  No thyromegaly  RESPIRATIONS:  Clear to auscultation bilaterally with no wheezes, rales, or  rhonchi.  2+ radial pulses bilaterally.  CARDIOVASCULAR:  Regular rate and rhythm.  No murmurs, rubs, or gallops.  Unable to appreciate PMI.  GI:  Morbidly obese.  No palpable organomegaly.  Soft, nontender,  nondistended.  Positive bowel sounds.  EXTREMITIES:  No cyanosis, clubbing, or edema.  NEUROLOGIC:  Alert and oriented x3.  Cranial nerves II-XII grossly intact.  Intact sensation.  No focal neurologic deficits.  Patient has generalized  weakness, has 4/5 strength in her upper and lower extremities bilaterally,  although unable to assess fully due to uncooperativeness or generalized  weakness.   ADMISSION LABORATORY DATA:  White blood cell count 5.4, hemoglobin 12.1,  platelets 174,000, MCV 92, ANC 2.3.  Sodium 136, potassium 3.7, chloride  101, bicarb 25, BUN 8, creatinine 1, glucose 275.  BNP 166.  CK 121, CK-MB  2.3, index 1.9, troponin I less than 0.01.   HOSPITAL COURSE:  1. Chest pain.  Patient has 2 to 3 episodes of chest pain that are      consistent with anginal-like chest pain.  The chest pain was left-      sided, radiated down her left arm, and resolved with nitroglycerin.     She has not had chest pain for several days now.  Patient was  admitted      with acute MI rule out.  She had an EKG that was unchanged from her      baseline.  She had cardiac enzymes x2 that were negative.  Her chest x-      ray did not show acute infectious etiology and aortic notch was not  widened.  She was given 325 mg of aspirin as well as morphine and      metoprolol.  She was seen by [facility] and they felt that      she did not need a catheterization at this time, though they would like      to follow up with her as an outpatient next week, seeing as how she is      Dr. Fredirick Maudlin patient and will likely do stress testing at that time.      Patient had an echo that was pending at the time of discharge and was      felt to be unlikely to change matters given remote history of echo in      2006 with an ejection fraction of 40% to 45% and no signs of heart      failure on exam.  2. Headache and dizziness.  Patient had a head CT, non-contrast, to      evaluate for a bleed given the neurologic deficits that she was      describing on admission.  There were no focal neurologic findings on      her exam and her head CT was negative.  3. Hypertension.  Patient's home medicines of lisinopril, metoprolol, and      Lasix were continued.  4. Congestive heart failure.  Echo pending.  Will continue patient on      lisinopril as well as Lasix.  BNP was elevated this admission.  5. Diabetes mellitus.  Patient was continued on her home insulin regimen.      It is recommended that she follow up as an outpatient for check and      follow of her hemoglobin A1c.  6. Hyperlipidemia.  Patient's fasting lipid profiles were checked this      hospitalization.  She was noted to have total cholesterol of 148,      triglycerides 53, HDL 75, LDL 62.  Patient is at goal for a diabetic      patient.  Will continue patient's Lipitor.  7. History of chronic renal failure.  Patient's creatinine was stable at      baseline of 1.  8. Osteopenia.  Patient will  continue on home dose of Fosamax.  9. Obstructive sleep apnea.  The patient has been evaluated by      polysomnogram as an outpatient and refused to wear a CPAP.  Patient      will continue to follow up with her primary care physician as needed.   DISCHARGE LABS:  Hemoglobin 12, white blood cell count 5.1, platelets  208,000.  Sodium 141, potassium 3.4, chloride 107, bicarb 27, BUN 10,  creatinine 1, glucose 180.   DISCHARGE VITAL SIGNS:  Temperature 98.9, blood pressure 153/57, pulse 63,  respirations 18, saturating 99% on room air.     ______________________________  Schuyler Amor    ______________________________  Hollace Hayward, M.D.    NG/MEDQ  D:  07/25/2006  T:  07/26/2006  Job:  [id number]

## 2011-02-28 NOTE — Discharge Summary (Signed)
[name], [name]                 ACCOUNT NO.:  [id number]   MEDICAL RECORD NO.:  [id number]          PATIENT TYPE:  INP   LOCATION:  5707                         FACILITY:  [facility]   PHYSICIAN:  Alvester Morin, M.D.  DATE OF BIRTH:  1932/12/26   DATE OF ADMISSION:  07/07/2005  DATE OF DISCHARGE:  07/11/2005                                 DISCHARGE SUMMARY   DISCHARGE DIAGNOSES:  1.  Community acquired pneumonia.  2.  Acute on chronic renal failure.  3.  Asthma.  4.  Normocytic anemia.  5.  Diabetes mellitus type 2, poorly controlled.  6.  Long QT interval.  7.  Elevated BNP.   DISCHARGE MEDICATIONS:  1.  Azithromycin 250 mg for 5 days.  2.  Zantac 150 mg b.i.d.  3.  Imdur 60 mg once daily.  4.  Aspirin 81 mg once daily.  5.  Ditropan XL 5 mg once daily.  6.  Albuterol inhaler 1-2 puffs p.r.n.  7.  Serevent twice daily.  8.  Lipitor 10 mg once daily.  9.  Actos 45 mg once daily.  10. Loratadine 10 mg once daily.  11. Benazepril 40 mg once daily.  12. Insulin 70/30 60 units in the morning, insulin 70/30 40 units in the      evening.   CONDITION AT DISCHARGE:  At discharge she was afebrile and oxygenating well  on room air.  She was using albuterol daily for wheezing.  Her energy was  improved and she was eating normally.  She will remain on azithromycin for 5  more days, for a total of 10 days of antibiotics.  Her insulin dose was  increased for elevated blood sugars in hospital and elevated hemoglobin A1c.  She was kept off her Lasix and potassium at discharge, as she had no  evidence of volume overload and was just beginning to be rehydrated.  Her  Lasix and potassium will need to be readdressed at future visits.  She was  taken off her Elavil for prolonged QT interval.  In the future, she will  need a repeat EKG to check for resolution of QT prolongation.  She was also  found to have an anemia.  She has been scheduled for colonoscopy with Dr.  Elnoria Howard on July 23, 2005, at 2:15 p.m.  She will also followup with me at  outpatient clinic on July 16, 2005, at 2:30 p.m. and will have a repeat  CBC and BMET at that time.   PROCEDURES:  July 07, 2005, EKG showed prolonged QT interval of 413.  July 07, 2005, chest x-ray showed cardiomegaly, left perihilar  infiltrate and bibasilar infiltrates.  July 08, 2005, renal ultrasound  was normal and showed no hydronephrosis.  July 08, 2005, echo showed  ejection fraction of 30-40% with moderate diffuse left ventricular  hypokinesis, moderate mitral regurg and PA pressure of 30 mmHg.   CONSULTS:  None.   ADMITTING HISTORY AND PHYSICAL:  Ms. Beirne is a 77 year old female with  history of asthma and chronic renal  insufficiency who presented with  progressive shortness  of breath, cough and weakness with fever and chills.  Her temperature was 100.7, blood pressure 128/74, pulse 106, respiratory  rate 16, sating 92% on room air.  She was found to have bibasilar crackles.  Her white count was 11.1, hemoglobin was 11.9, platelets 291, sodium 132,  potassium 3.7, chloride 95, CO2 23, BUN 15, creatinine 2.3, glucose of 465,  BNP was 504.9.   HOSPITAL COURSE:  The patient was started on IV antibiotics for community  acquired pneumonia.  She received 5 days of IV azithromycin and ceftriaxone.  On day 4 she defervesced and leukocytosis trended downward from high of 14.5  to 9.8.  Her shortness of breath improved.  She was still wheezing and  received breathing treatments with albuterol and Atrovent.  She was feeling  better at the time of discharge.  She will complete a 10 day course of  azithromycin.  She will also use her inhalers as needed for wheezing.   Diabetes type 2:  Patient's blood pressures were elevated to 300s-400s on  admission.  Patient was treated with insulin NPH and Regular, and dosages  were adjusted based on sliding scale needs.  Hemoglobin A1c was found to be  9.1.  Patient's  blood sugars normalized on higher doses of insulin.  Patient  is sent home on Actos and insulin 70/30 60 units in the morning and 40 units  in the evening.   Acute on chronic renal failure:  Patient's creatinine on admission was 2.3.  Her FENa was measured and found to be low.  Patient received IV fluids and  creatinine steadily decreased to 1.3 on discharge.   Anemia:  Patient was found to be anemic.  Iron studies showed an iron of 10,  TIBC of 221, % saturation of 5%.  Ferritin was 243.  MCV was 84.  Patient  was scheduled for outpatient colonoscopy.   Long QT:  On admission, patient had a QT of 413 corrected to 527.  Patient  was on Elavil for pain for many years.  Patient says she does not have pain  anymore and is not interested in continuing this medication.  Elavil was  stopped.  Patient will need to followup with an EKG outpatient to watch for  resolution of prolonged QT interval.   Discharge temperature was 98.5, pulse 82, respiratory rate of 18, blood  pressure 140/64, sating 98% on room air.  Lungs were clear.  White blood  cell count was 9.8.  Hemoglobin was 8.6, platelets 323, creatinine was 1.3,  glucose was 79.  Potassium was 3.2 on the day of discharge.  She was given 2  doses of potassium before she left.   PENDING LABS:  None.     ______________________________  Georgetta Haber, Acting Intern      Alvester Morin, M.D.  Electronically Signed    SR/MEDQ  D:  07/13/2005  T:  07/13/2005  Job:  [id number]   cc:   Outpatient Clinic - Dr. Elnoria Howard  Fax [phone]

## 2011-03-03 ENCOUNTER — Ambulatory Visit (INDEPENDENT_AMBULATORY_CARE_PROVIDER_SITE_OTHER): Payer: Medicare Other | Admitting: Pharmacist

## 2011-03-03 DIAGNOSIS — I6340 Cerebral infarction due to embolism of unspecified cerebral artery: Secondary | ICD-10-CM

## 2011-03-03 DIAGNOSIS — Z7901 Long term (current) use of anticoagulants: Secondary | ICD-10-CM

## 2011-03-03 LAB — POCT INR: INR: 2.4

## 2011-03-03 NOTE — Patient Instructions (Signed)
Patient instructed to take medications as defined in the Anti-coagulation Track section of this encounter.  Patient instructed to take today's dose.  Patient verbalized understanding of these instructions.    

## 2011-03-03 NOTE — Progress Notes (Signed)
Anti-Coagulation Progress Note  [name] is a 77 y.o. female who is currently on an anti-coagulation regimen.    RECENT RESULTS: Recent results are below, the most recent result is correlated with a dose of 42.5 mg. per week: Lab Results  Component Value Date   INR 2.4 03/03/2011   INR 2.5 02/03/2011   INR 2.2 01/06/2011    ANTI-COAG DOSE:   Latest dosing instructions   Total Sun Mon Tue Wed Thu Fri Sat   42.5 5 mg 7.5 mg 5 mg 7.5 mg 5 mg 7.5 mg 5 mg    (5 mg1) (5 mg1.5) (5 mg1) (5 mg1.5) (5 mg1) (5 mg1.5) (5 mg1)         ANTICOAG SUMMARY: Anticoagulation Episode Summary              Current INR goal 2.0-3.0 Next INR check 03/31/2011   INR from last check 2.4 (03/03/2011)     Weekly max dose (mg)  Target end date Indefinite   Indications CEREBRAL EMBOLISM, WITH INFARCTION, Long term current use of anticoagulant   [facility] check [facility] lab    Send INR reminders to [facility]   Comments        Provider Role Specialty Phone number   Blanch Media  Internal Medicine [phone]        ANTICOAG TODAY: Anticoagulation Summary as of 03/03/2011              INR goal 2.0-3.0     Selected INR 2.4 (03/03/2011) Next INR check 03/31/2011   Weekly max dose (mg)  Target end date Indefinite   Indications CEREBRAL EMBOLISM, WITH INFARCTION, Long term current use of anticoagulant    Anticoagulation Episode Summary              INR check location [facility] Preferred lab    Send INR reminders to [facility]   Comments        Provider Role Specialty Phone number   Blanch Media  Internal Medicine [phone]        PATIENT INSTRUCTIONS: Patient Instructions  Patient instructed to take medications as defined in the Anti-coagulation Track section of this encounter.  Patient instructed to take today's dose.  Patient verbalized understanding of these instructions.        FOLLOW-UP Return in 4 weeks (on 03/31/2011) for Follow up  INR.  Hulen Luster, III Pharm.D., CACP

## 2011-03-14 ENCOUNTER — Encounter: Payer: Self-pay | Admitting: Internal Medicine

## 2011-03-14 ENCOUNTER — Ambulatory Visit (INDEPENDENT_AMBULATORY_CARE_PROVIDER_SITE_OTHER): Payer: Medicare Other | Admitting: Internal Medicine

## 2011-03-14 DIAGNOSIS — N189 Chronic kidney disease, unspecified: Secondary | ICD-10-CM

## 2011-03-14 DIAGNOSIS — I509 Heart failure, unspecified: Secondary | ICD-10-CM

## 2011-03-14 DIAGNOSIS — I10 Essential (primary) hypertension: Secondary | ICD-10-CM

## 2011-03-14 DIAGNOSIS — Z7901 Long term (current) use of anticoagulants: Secondary | ICD-10-CM

## 2011-03-14 DIAGNOSIS — E119 Type 2 diabetes mellitus without complications: Secondary | ICD-10-CM

## 2011-03-14 DIAGNOSIS — J019 Acute sinusitis, unspecified: Secondary | ICD-10-CM

## 2011-03-14 LAB — BASIC METABOLIC PANEL
BUN: 24 mg/dL — ABNORMAL HIGH (ref 6–23)
CO2: 26 mEq/L (ref 19–32)
Chloride: 100 mEq/L (ref 96–112)
Creat: 1.35 mg/dL — ABNORMAL HIGH (ref 0.50–1.10)

## 2011-03-14 LAB — GLUCOSE, CAPILLARY: Glucose-Capillary: 364 mg/dL — ABNORMAL HIGH (ref 70–99)

## 2011-03-14 MED ORDER — DOXYCYCLINE HYCLATE 100 MG PO TABS: 100.0000 mg | ORAL_TABLET | Freq: Two times a day (BID) | ORAL | Status: AC

## 2011-03-14 MED ORDER — MUPIROCIN CALCIUM 2 % EX CREA: TOPICAL_CREAM | Freq: Three times a day (TID) | CUTANEOUS | Status: AC

## 2011-03-14 MED ORDER — DOXYCYCLINE HYCLATE 50 MG PO CAPS: 100.0000 mg | ORAL_CAPSULE | Freq: Two times a day (BID) | ORAL | Status: DC

## 2011-03-14 MED ORDER — INSULIN GLARGINE 100 UNIT/ML ~~LOC~~ SOLN: 15.0000 [IU] | Freq: Every day | SUBCUTANEOUS | Status: DC

## 2011-03-14 NOTE — Assessment & Plan Note (Signed)
Ms. Barraco has experienced approximately 14 days of increased nasal drainage, productive cough, sinus congestion with reported intermittent fever as high as 101. At this point I feel it is reasonable to treat her with a course of antibiotics. Will prescribe a seven-day course of doxycycline. Patient is advised that antibiotic therapy many interact with her Coumadin and that she will need close followup with Dr. Alexandria Lodge.  She is advised to schedule an appointment with him early next week. If she is unable to do this I have asked her to return to clinic next Tuesday for PT and INR check.  If her INR is supratherapeutic I will likely advise her to hold her Coumadin dose and  enlist Dr. Alexandria Lodge for further assistance regarding her Coumadin regimen.

## 2011-03-14 NOTE — Progress Notes (Signed)
Subjective:    Patient ID: [name], female    DOB: 29-Oct-1932, 77 y.o.   MRN: [id number]  HPI  [name] is a very pleasant 77 year old female with past medical history outlined in the EMR who presents today for routine followup and medication refill.    #1 DM2:  patient is taking 10 units of Lantus each night.  She has her glucometer with her and reports persistently elevated CBGs over the past few months. Earlier this year she been having significant problems with nighttime hypoglycemia occuring between 10pm ans 12am  that was relatively asymptomatic.  At that time, her Lantus was decreased from 20 to 10u QHS.  Her Metformin was discontinued 2/2 renal failure following her hospitalization in August 2011.   She denies any continued hypoglycemix events but is concerned that her CBGs remained significantly elevated.  H theher CBG log report reveals blood glucose measurements ranging from the mid 200s to upper 400s throughout the day in am fasting as well as pre-and postprandial readings.  At her last appointment, [name] is Lantus was increased from 10  to 12 units each night with a plan to gradually increase her Lantus to treat her persistent hyperglycemia and increased hemoglobin A1c as well avoiding the problem she was having with nighttime hypoglycemia between 10 PM and 12 AM.  She has been using 12 units every night with CBGs averaging in the 300-400.    #2 CHF [name] follows with Dr. Clarene Duke. She denies any shortness of breath or increasing lower extremity edema. She reports 100% compliance with her current medication regimen. She denies chest pain palpitations syncope dizziness or other complaint.  She last saw him in the end of April.   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 She is doing well with Coumadin and is continuing to follow with Dr. Alexandria Lodge. She denies any falls, shortness of breath or abnormal bleeding.  #5 Pt reports cold symptoms.  She reports increased sinus pressure with increased post-nasal drip as well cough.  She describes her cough as productive of thick white sputum.  She states her highest home temp was 101F approx 5 days PTA.  SHe notes her symptoms have been present for ~2 weeks and seem to be slowly improving.    [name] has no other concerns or complaints today.      Review of Systems 12 point review of systems completed. Pertinent positives and negatives outlined in above history of present illness. All other systems reviewed and negative.     Objective:   Physical Exam VItal signs reviewed and stable. GEN: No apparent distress.  Alert and oriented x 3.  Pleasant, conversant, and cooperative to exam. HEENT: head is autraumatic and normocephalic.  Neck is supple without palpable masses or lymphadenopathy.  No JVD or carotid bruits.  Vision intact.  EOMI.  PERRLA.  Sclerae anicteric.  Conjunctivae without pallor or injection. Mucous membranes are moist.  Oropharynx is without erythema, exudates, or other abnormal lesions.  Mild amount of mucoid drainage present in posterior oropharynx.  Tenderness to palpation noted on bilateral frontal and maxillary sinuses. RESP:  Lungs are clear to ascultation bilaterally with good air movement.  No wheezes, ronchi, or rubs. CARDIOVASCULAR: regular rate, normal rhythm.  Clear S1, S2 ABDOMEN: soft, non-tender,  non-distended.  Bowels sounds present in all quadrants and normoactive.  No palpable masses. EXT: warm and dry.  Peripheral pulses equal, intact, and +2 globally.  No clubbing or cyanosis.  Trace, non-pitting edema in bilateral lower extremities. SKIN: warm and dry with normal turgor.  No rashes or abnormal lesions observed. NEURO: CN II-XII grossly intact.  Muscle strength +5/5 in bilateral  upper and lower extremities.  Sensation is grossly intact.  No focal deficit.        Assessment & Plan:

## 2011-03-14 NOTE — Assessment & Plan Note (Signed)
Ms. Kettering hemoglobin A1c  has a significantly increased from 8.7  to 14; and is now 13.7.  Her CBG log reveals glucose measurements consistently in the high 200s to low 400s.   Will slowly increase her Lantus to achieve improved control her her DM2.  I believe a reasonable A1c goal for her is 8 to avoid  life-threatenting hypoglycemia.  Will increase to 15u SQ and have her return for an office visit in 2 weeks to review her CBGs. At that time I anticipate the need to increase her insulin dose further likely to 18 or 20 units.  Will refer pt to Jamison Neighbor for additional assistance managing her DM2.

## 2011-03-14 NOTE — Assessment & Plan Note (Signed)
Will check BMET today to assess renal function

## 2011-03-14 NOTE — Assessment & Plan Note (Signed)
This appears to be stable. Patient denies shortness of breath, dyspnea on exertion, chest pain, or increased peripheral edema. Will continue with her current regimen

## 2011-03-14 NOTE — Patient Instructions (Addendum)
Increase your insulin to 15u every night. Please schedule a follow up appt in 2 weeks with Dr. Arvilla Market to review your sugar readings. Schedule an appointment to see Dr. Alexandria Lodge next Monday to check your coumadin level.  THIS IS IMPORTANT. Take your antibiotic (doxycycline) as directed.

## 2011-03-14 NOTE — Assessment & Plan Note (Signed)
Stable.  WIll continue current regimen and check BMET today. 

## 2011-03-18 ENCOUNTER — Ambulatory Visit (INDEPENDENT_AMBULATORY_CARE_PROVIDER_SITE_OTHER): Payer: Medicare Other | Admitting: Pharmacist

## 2011-03-18 DIAGNOSIS — Z7901 Long term (current) use of anticoagulants: Secondary | ICD-10-CM

## 2011-03-18 NOTE — Progress Notes (Signed)
Anti-Coagulation Progress Note  [name] is a 77 y.o. female who is currently on an anti-coagulation regimen.    RECENT RESULTS: Recent results are below, the most recent result is correlated with a dose of 42.5 mg. per week: Lab Results  Component Value Date   INR 2.3 03/18/2011   INR 2.4 03/03/2011   INR 2.5 02/03/2011    ANTI-COAG DOSE:   Latest dosing instructions   Total Sun Mon Tue Wed Thu Fri Sat   42.5 5 mg 7.5 mg 5 mg 7.5 mg 5 mg 7.5 mg 5 mg    (5 mg1) (5 mg1.5) (5 mg1) (5 mg1.5) (5 mg1) (5 mg1.5) (5 mg1)         ANTICOAG SUMMARY: Anticoagulation Episode Summary              Current INR goal 2.0-3.0 Next INR check 04/07/2011   INR from last check 2.3 (03/18/2011)     Weekly max dose (mg)  Target end date Indefinite   Indications CEREBRAL EMBOLISM, WITH INFARCTION, Long term current use of anticoagulant   IN[facility] check location [facility] Preferred lab    Send INR reminders to [facility]   Comments        Provider Role Specialty Phone number   Blanch Media  Internal Medicine [phone]        ANTICOAG TODAY: Anticoagulation Summary as of 03/18/2011              INR goal 2.0-3.0     Selected INR 2.3 (03/18/2011) Next INR check 04/07/2011   Weekly max dose (mg)  Target end date Indefinite   Indications CEREBRAL EMBOLISM, WITH INFARCTION, Long term current use of anticoagulant    Anticoagulation Episode Summary              INR check location [facility] Preferred lab    Send INR reminders to [facility]   Comments        Provider Role Specialty Phone number   Blanch Media  Internal Medicine [phone]        PATIENT INSTRUCTIONS: Patient Instructions  Patient instructed to take medications as defined in the Anti-coagulation Track section of this encounter.  Patient instructed to take today's dose.  Patient verbalized understanding of these instructions.        FOLLOW-UP Return in 3 weeks (on 04/07/2011) for Follow up  INR.  Hulen Luster, III Pharm.D., CACP

## 2011-03-18 NOTE — Patient Instructions (Signed)
Patient instructed to take medications as defined in the Anti-coagulation Track section of this encounter.  Patient instructed to take today's dose.  Patient verbalized understanding of these instructions.    

## 2011-03-28 ENCOUNTER — Ambulatory Visit (INDEPENDENT_AMBULATORY_CARE_PROVIDER_SITE_OTHER): Payer: Medicare Other | Admitting: Internal Medicine

## 2011-03-28 ENCOUNTER — Encounter: Payer: Self-pay | Admitting: Internal Medicine

## 2011-03-28 DIAGNOSIS — R300 Dysuria: Secondary | ICD-10-CM

## 2011-03-28 DIAGNOSIS — R51 Headache: Secondary | ICD-10-CM

## 2011-03-28 DIAGNOSIS — E119 Type 2 diabetes mellitus without complications: Secondary | ICD-10-CM

## 2011-03-28 DIAGNOSIS — J019 Acute sinusitis, unspecified: Secondary | ICD-10-CM

## 2011-03-28 MED ORDER — AMOXICILLIN-POT CLAVULANATE 875-125 MG PO TABS: 1.0000 | ORAL_TABLET | Freq: Two times a day (BID) | ORAL | Status: DC

## 2011-03-28 MED ORDER — INSULIN GLARGINE 100 UNIT/ML ~~LOC~~ SOLN: 20.0000 [IU] | Freq: Every day | SUBCUTANEOUS | Status: DC

## 2011-03-28 MED ORDER — FLUTICASONE PROPIONATE 50 MCG/ACT NA SUSP: 1.0000 | Freq: Every day | NASAL | Status: DC

## 2011-03-28 MED ORDER — INSULIN SYRINGES (DISPOSABLE) U-100 0.3 ML MISC: Status: DC

## 2011-03-28 NOTE — Patient Instructions (Addendum)
Schedule a followup appointment with Dr. Arvilla Market as of the last week of June. Interval a followup appointment with Dr. Alexandria Lodge next week to check your INR. Flonase is a nose spray to help with your sinus congestion and headache. Use over-the-counter Tylenol as directed for your headache. Augmentin is an antibiotic to treat a possible sinus infection. Take 20 units of Lantus every night. If you continue to have fevers call the clinic. Be sure to bring your glucose meter with you to your next appointment

## 2011-03-28 NOTE — Assessment & Plan Note (Signed)
Pt continues to experience a frontal headache; that is present for 3-4 days/week.  She describes the pain as an 8-9/10.

## 2011-03-28 NOTE — Progress Notes (Signed)
  Subjective:    Patient ID: [name], female    DOB: [birth year], 77 y.o.   MRN: [id number]  HPI Please see the A&P for the status of the pt's chronic medical problems.    Review of Systems Fever. Last febrile episode with Monday a 4 days or to her office visit; temp record at that time was 101. dysuria    Objective:   Physical Exam    VItal signs reviewed and stable. GEN: No apparent distress.  Alert and oriented x 3.  Pleasant, conversant, and cooperative to exam. HEENT: head is autraumatic and normocephalic.  Neck is supple without palpable masses or lymphadenopathy.  No JVD or carotid bruits.  Vision intact.  EOMI.  PERRLA.  Sclerae anicteric.  Conjunctivae without pallor or injection. Mucous membranes are moist.  Oropharynx is without erythema, exudates, or other abnormal lesions; increased mucoid drainage noted in posterior oropharynx. Mild tenderness to palpation of bilateral maxillary sinuses RESP:  Lungs are clear to ascultation bilaterally with good air movement.  No wheezes, ronchi, or rubs. CARDIOVASCULAR: regular rate, normal rhythm.  Clear S1, S2, 3/6 systolic murmurs, gallops, or rubs. ABDOMEN: soft, non-tender, non-distended.  Bowels sounds present in all quadrants and normoactive.  No palpable masses. EXT: warm and dry.  Peripheral pulses equal, intact, and +2 globally.  No clubbing or cyanosis.  Trace edema in bilateral lower extremities. SKIN: warm and dry with normal turgor.  No rashes or abnormal lesions observed. NEURO: CN II-XII grossly intact.  Muscle strength +5/5 in bilateral upper and lower extremities.  Sensation is grossly intact.  No focal deficit.     Assessment & Plan:

## 2011-03-28 NOTE — Assessment & Plan Note (Addendum)
Pt is using an insulin pen.  She is not sure that is using the pen correctly.  She is able to see the numbers and turn the dial but has problems with the injection.  She is more comfortable using a vial and syringe and states that she would prefer to return to this form of insulin.  I will write her a prescription for insulin vials and also increase her Lantus to 20 units each bedtime to improve glycemic control. I have asked her to followup in 2 weeks to review all her CBGs and assess the need for further increase in her insulin.

## 2011-03-29 LAB — URINALYSIS, MICROSCOPIC ONLY
Casts: NONE SEEN
Crystals: NONE SEEN
Squamous Epithelial / LPF: NONE SEEN

## 2011-03-29 LAB — URINALYSIS, ROUTINE W REFLEX MICROSCOPIC
Bilirubin Urine: NEGATIVE
Glucose, UA: >1000 mg/dL — AB
Hgb urine dipstick: NEGATIVE
Leukocytes, UA: NEGATIVE
pH: 5 (ref 5.0–8.0)

## 2011-03-30 ENCOUNTER — Emergency Department (HOSPITAL_COMMUNITY): Payer: Medicare Other

## 2011-03-30 ENCOUNTER — Inpatient Hospital Stay (HOSPITAL_COMMUNITY)
Admission: EM | Admit: 2011-03-30 | Discharge: 2011-03-31 | DRG: 303 | Disposition: A | Discharge status: Home / Self Care | Payer: Medicare Other | Attending: Internal Medicine | Admitting: Internal Medicine

## 2011-03-30 DIAGNOSIS — I509 Heart failure, unspecified: Secondary | ICD-10-CM | POA: Diagnosis present

## 2011-03-30 DIAGNOSIS — I200 Unstable angina: Secondary | ICD-10-CM | POA: Diagnosis present

## 2011-03-30 DIAGNOSIS — E785 Hyperlipidemia, unspecified: Secondary | ICD-10-CM | POA: Diagnosis present

## 2011-03-30 DIAGNOSIS — Z7901 Long term (current) use of anticoagulants: Secondary | ICD-10-CM

## 2011-03-30 DIAGNOSIS — I129 Hypertensive chronic kidney disease with stage 1 through stage 4 chronic kidney disease, or unspecified chronic kidney disease: Secondary | ICD-10-CM | POA: Diagnosis present

## 2011-03-30 DIAGNOSIS — Z8673 Personal history of transient ischemic attack (TIA), and cerebral infarction without residual deficits: Secondary | ICD-10-CM

## 2011-03-30 DIAGNOSIS — G4733 Obstructive sleep apnea (adult) (pediatric): Secondary | ICD-10-CM | POA: Diagnosis present

## 2011-03-30 DIAGNOSIS — E109 Type 1 diabetes mellitus without complications: Secondary | ICD-10-CM | POA: Diagnosis present

## 2011-03-30 DIAGNOSIS — Z7982 Long term (current) use of aspirin: Secondary | ICD-10-CM

## 2011-03-30 DIAGNOSIS — N183 Chronic kidney disease, stage 3 unspecified: Secondary | ICD-10-CM | POA: Diagnosis present

## 2011-03-30 DIAGNOSIS — I2510 Atherosclerotic heart disease of native coronary artery without angina pectoris: Principal | ICD-10-CM | POA: Diagnosis present

## 2011-03-30 DIAGNOSIS — Z794 Long term (current) use of insulin: Secondary | ICD-10-CM

## 2011-03-30 LAB — GLUCOSE, CAPILLARY: Glucose-Capillary: 493 mg/dL — ABNORMAL HIGH (ref 70–99)

## 2011-03-31 ENCOUNTER — Other Ambulatory Visit: Payer: Self-pay | Admitting: Internal Medicine

## 2011-03-31 ENCOUNTER — Encounter: Payer: Self-pay | Admitting: Internal Medicine

## 2011-03-31 DIAGNOSIS — R7309 Other abnormal glucose: Secondary | ICD-10-CM

## 2011-03-31 DIAGNOSIS — R079 Chest pain, unspecified: Secondary | ICD-10-CM

## 2011-03-31 LAB — URINALYSIS, ROUTINE W REFLEX MICROSCOPIC
Leukocytes, UA: NEGATIVE
Nitrite: NEGATIVE
Protein, ur: NEGATIVE mg/dL
Specific Gravity, Urine: 1.012 (ref 1.005–1.030)
Urobilinogen, UA: 0.2 mg/dL (ref 0.0–1.0)

## 2011-03-31 LAB — CBC
HCT: 37.3 % (ref 36.0–46.0)
Hemoglobin: 10.7 g/dL — ABNORMAL LOW (ref 12.0–15.0)
MCH: 27.4 pg (ref 26.0–34.0)
MCHC: 33.3 g/dL (ref 30.0–36.0)
MCV: 82.3 fL (ref 78.0–100.0)
Platelets: 183 10*3/uL (ref 150–400)
RBC: 3.9 MIL/uL (ref 3.87–5.11)
RDW: 13.1 % (ref 11.5–15.5)
RDW: 13.5 % (ref 11.5–15.5)
WBC: 5.7 10*3/uL (ref 4.0–10.5)

## 2011-03-31 LAB — GLUCOSE, CAPILLARY
Glucose-Capillary: 266 mg/dL — ABNORMAL HIGH (ref 70–99)
Glucose-Capillary: 174 mg/dL — ABNORMAL HIGH (ref 70–99)
Glucose-Capillary: 139 mg/dL — ABNORMAL HIGH (ref 70–99)

## 2011-03-31 LAB — COMPREHENSIVE METABOLIC PANEL
ALT: 19 U/L (ref 0–35)
AST: 15 U/L (ref 0–37)
Alkaline Phosphatase: 62 U/L (ref 39–117)
BUN: 20 mg/dL (ref 6–23)
CO2: 26 mEq/L (ref 19–32)
Calcium: 8.8 mg/dL (ref 8.4–10.5)
Chloride: 108 mEq/L (ref 96–112)
GFR calc Af Amer: >60 mL/min (ref >60)
GFR calc non Af Amer: 57 mL/min — ABNORMAL LOW (ref >60)
Glucose, Bld: 197 mg/dL — ABNORMAL HIGH (ref 70–99)
Sodium: 141 mEq/L (ref 135–145)
Total Bilirubin: 0.3 mg/dL (ref 0.3–1.2)

## 2011-03-31 LAB — CARDIAC PANEL(CRET KIN+CKTOT+MB+TROPI)
CK, MB: 2.8 ng/mL (ref 0.3–4.0)
Relative Index: 2 (ref 0.0–2.5)
Relative Index: 2.1 (ref 0.0–2.5)
Total CK: 129 U/L (ref 7–177)
Total CK: 132 U/L (ref 7–177)
Troponin I: <0.3 ng/mL (ref <0.30)

## 2011-03-31 LAB — DIFFERENTIAL
Basophils Absolute: 0 10*3/uL (ref 0.0–0.1)
Basophils Relative: 0 % (ref 0–1)
Eosinophils Absolute: 0.1 10*3/uL (ref 0.0–0.7)
Eosinophils Relative: 1 % (ref 0–5)
Lymphocytes Relative: 48 % — ABNORMAL HIGH (ref 12–46)

## 2011-03-31 LAB — BASIC METABOLIC PANEL
BUN: 27 mg/dL — ABNORMAL HIGH (ref 6–23)
Chloride: 101 mEq/L (ref 96–112)
GFR calc Af Amer: 51 mL/min — ABNORMAL LOW (ref >60)
Glucose, Bld: 418 mg/dL — ABNORMAL HIGH (ref 70–99)
Potassium: 4.1 mEq/L (ref 3.5–5.1)
Sodium: 135 mEq/L (ref 135–145)

## 2011-03-31 LAB — LIPID PANEL
LDL Cholesterol: 44 mg/dL (ref 0–99)
Triglycerides: 32 mg/dL (ref <150)
VLDL: 6 mg/dL (ref 0–40)

## 2011-03-31 LAB — CK TOTAL AND CKMB (NOT AT ARMC)
CK, MB: 3.1 ng/mL (ref 0.3–4.0)
Relative Index: 1.8 (ref 0.0–2.5)

## 2011-03-31 LAB — URINE MICROSCOPIC-ADD ON

## 2011-03-31 LAB — TROPONIN I: Troponin I: <0.3 ng/mL (ref <0.30)

## 2011-03-31 LAB — HEPARIN LEVEL (UNFRACTIONATED): Heparin Unfractionated: 0.29 IU/mL — ABNORMAL LOW (ref 0.30–0.70)

## 2011-03-31 NOTE — Progress Notes (Signed)
Hospital Admission Note                  +++Place in progress note section+++ Date: 03/31/2011  Patient name: [name] Medical record number: [id number] Date of birth: May 18, 1933 Age: 77 y.o. Gender: female PCP: Nelda Bucks, MD   Attending physician:  Dr. Rogelia Boga Resident (R2/R3):  Eben Burow   Pager: [phone] Resident (R1):  Ho   Pager: [phone]  Chief Complaint: CP/ Hyperglycemia.   History of Present Illness: [name] is a 77 year old woman who presented to the [facility] ED with complaints of chest pain and hyperglycemia.  She states that her blood sugars have been going up over the last few days but today she noticed that her meter would not read her blood sugar it was so high.  She states she has been having to use the bathroom more often but denies any nausea, vomiting, or abdominal pain.  She also states that she has had problems with chest pain since yesterday.  The pain has been episodic and centered under her left breast and moving up into her left shoulder.  She states that she was laying in bed yesterday and felt a dull ache in her chest that lasted a few minutes.  She has had several episodes of chest pain but the worst pain was this afternoon when she had an episode of a sharp, sudden pain in the same distribution that was associated with SOB when she was lying in bed.  She denies diaphoresis, worsening with activity, abdominal pain, swelling in her legs, or orthopnea.    Medication Sig  . albuterol (PROVENTIL HFA) 108 (90 BASE) MCG/ACT inhaler Inhale 2 puffs into the lungs every 4 (four) hours as needed. For wheezing or shortness of breath.   Marland Kitchen alendronate (FOSAMAX) 35 MG tablet Take 35 mg by mouth every 7 (seven) days. Take in the morning with a full glass of water, on an empty stomach, and do not take anything else by mouth or lie down for the next 30 min.   Marland Kitchen amLODipine (NORVASC) 10 MG tablet TAKE ONE (1) TABLET EACH DAY                                                  Generic for  NORVASC  . amoxicillin-clavulanate (AUGMENTIN) 875-125 MG per tablet Take 1 tablet by mouth 2 (two) times daily.  Marland Kitchen aspirin 81 MG EC tablet Take 81 mg by mouth daily.    . Blood Glucose Monitoring Suppl (PRODIGY AUTOCODE BLOOD GLUCOSE) W/DEVICE KIT Use to test blood sugar 3 times a day or as instructed.   . calcium-vitamin D (OSCAL WITH D 500-200) 500-200 MG-UNIT per tablet Take 1 tablet by mouth 3 (three) times daily.  . carvedilol (COREG) 12.5 MG tablet Take 12.5 mg by mouth 2 (two) times daily with meals.    . cloNIDine (CATAPRES) 0.1 MG/24HR Place 1 patch onto the skin once a week.    . flunisolide (NASALIDE) 0.025 % SOLN Two sprays per nostril two times a day  . fluticasone (FLONASE) 50 MCG/ACT nasal spray Place 1 spray into the nose daily.  . insulin glargine (LANTUS) 100 UNIT/ML injection Inject 20 Units into the skin at bedtime.  . Insulin Syringe-Needle U-100 (INSULIN SYRINGE .5CC/31GX5/16") 31G X 5/16" 0.5 ML MISC Use to inject insulin 3 times daily as  instructed.   . Insulin Syringes, Disposable, U-100 0.3 ML MISC Use to inject insulin  . isosorbide mononitrate (IMDUR) 60 MG 24 hr tablet Take 60 mg by mouth daily.    . Lancets MISC Use to test blood sugar 3 times a day or as instructed.   Marland Kitchen lisinopril (PRINIVIL,ZESTRIL) 40 MG tablet Take 40 mg by mouth daily.    Marland Kitchen loratadine (CLARITIN) 10 MG tablet Take 10 mg by mouth daily.    . nitroGLYCERIN (NITROSTAT) 0.4 MG SL tablet Place 0.4 mg under the tongue every 5 (five) minutes as needed.    . ranitidine (ZANTAC) 75 MG tablet Take 75 mg by mouth daily as needed.    . rosuvastatin (CRESTOR) 20 MG tablet Take 1 tablet (20 mg total) by mouth daily.  . salmeterol (SEREVENT) 50 MCG/DOSE diskus inhaler Inhale 1 puff into the lungs daily.    Marland Kitchen senna (SENOKOT) 8.6 MG tablet Take 1-2 tablets by mouth daily.  Marland Kitchen warfarin (COUMADIN) 5 MG tablet Take 1 tablet (5 mg total) by mouth as directed.  . zolpidem (AMBIEN) 5 MG tablet Take 1 tablet (5 mg  total) by mouth at bedtime as needed for sleep.    Allergies: Baking soda-fluoride  Patient Active Problem List  Diagnoses  . DIABETES MELLITUS, TYPE II  . GASTROPARESIS, DIABETIC  . HYPOGLYCEMIA  . HYPERLIPIDEMIA  . OBESITY  . ANEMIA  . OBSTRUCTIVE SLEEP APNEA  . PERIPHERAL NEUROPATHY  . HYPERTENSION  . CONGESTIVE HEART FAILURE  . CEREBRAL EMBOLISM, WITH INFARCTION  . ALLERGIC  RHINITIS  . REACTIVE AIRWAY DISEASE  . GERD  . CONSTIPATION  . DEGENERATIVE JOINT DISEASE  . PAIN IN JOINT, MULTIPLE SITES  . LOW BACK PAIN, CHRONIC  . OSTEOPOROSIS  . INSOMNIA-SLEEP DISORDER-UNSPEC  . HEADACHE  . CHEST PAIN UNSPECIFIED  . CRI (chronic renal insufficiency)  . Long term current use of anticoagulant  . SINUSITIS, ACUTE  . CAD (coronary artery disease)  . Acute sinusitis with symptoms > 10 days     Past Medical History  Diagnosis Date  . CAD (coronary artery disease)     Pt reports MI in 2006 (no documentation).  Cardiolite in 05/2002 and 07/2006 did not reveal any reversible ischemia.  Pt follows with Dr. Clarene [facility] at [facility].  . CHF (congestive heart failure)     EF 25-30% with dilated LV, mild LVH, severe hypokinesis, and mod-severe reduction in RV function  . Osteoporosis     Past Surgical History  Procedure Date  . Abdominal hysterectomy   . Foot surgery     No family history on file.  History   Social History  . Marital Status: Widowed    Spouse Name: N/A    Number of Children: N/A  . Years of Education: N/A   Occupational History  . Not on file.   Social History Main Topics  . Smoking status: Never Smoker   . Smokeless tobacco: Not on file  . Alcohol Use: No  . Drug Use: No  . Sexually Active: No   Other Topics Concern  . Not on file   Social History Narrative   Lives with daughter    Review of Systems: Negative except as per HPI.   Physical Exam: Vitals: T= 97.3,  BP=148/100, HR=62 RR= 19 , O2 Sat= 100% on 2L. General:  alert, well-developed,  and cooperative to examination.   Head:  normocephalic and atraumatic.   Eyes:  vision grossly intact, pupils equal, pupils round, pupils reactive to  light, no injection and anicteric.   Mouth:  pharynx pink and moist, no erythema, and no exudates.   Neck:  supple, full ROM, no thyromegaly, no JVD, and no carotid bruits.   Lungs:  normal respiratory effort, no accessory muscle use, normal breath sounds, no crackles, and no wheezes. Heart:  normal rate, regular rhythm, There is a 3/6 systolic murmur best heard at the apex, no gallop, and no rub.   Abdomen:  soft, non-tender, normal bowel sounds, no distention, no guarding, no rebound tenderness, no hepatomegaly, and no splenomegaly.   Msk:  no joint swelling, no joint warmth, and no redness over joints.   Pulses:  2+ DP/PT pulses bilaterally Extremities:  No cyanosis or clubbing.  Trace edema in the bilateral lower extremities.  Neurologic:  alert & oriented X3, cranial nerves II-XII intact, strength normal in all extremities, sensation intact to light touch, and gait normal.   Skin:  turgor normal and no rashes.   Psych:  Oriented X3, memory intact for recent and remote, normally interactive, good eye contact, not anxious appearing, and not depressed appearing.   Lab results:  WBC                                      5.7               4.0-10.5         K/uL  RBC                                      4.53              3.87-5.11        MIL/uL  Hemoglobin (HGB)                         12.5              12.0-15.0        g/dL  Hematocrit (HCT)                         37.3              36.0-46.0        %  MCV                                      82.3              78.0-100.0       fL  MCH -                                    27.6              26.0-34.0        pg  MCHC                                     33.5              30.0-36.0  g/dL  RDW                                      13.1              11.5-15.5        %  Platelet Count (PLT)                      183               150-400          K/uL  Neutrophils, %                           46                43-77            %  Lymphocytes, %                           48         h      12-46            %  Monocytes, %                             5                 3-12             %  Eosinophils, %                           1                 0-5              %  Basophils, %                             0                 0-1              %  Neutrophils, Absolute                    2.6               1.7-7.7          K/uL  Lymphocytes, Absolute                    2.7               0.7-4.0          K/uL  Monocytes, Absolute                      0.3               0.1-1.0          K/uL  Eosinophils, Absolute                    0.1               0.0-0.7  K/uL  Basophils, Absolute                      0.0               0.0-0.1          K/uL   Color, Urine                             STRAW      a      YELLOW  Appearance                               CLEAR             CLEAR  Specific Gravity                         1.012             1.005-1.030  pH                                       7.0               5.0-8.0  Urine Glucose                            >1000      a      NEG              mg/dL  Bilirubin                                NEGATIVE          NEG  Ketones                                  NEGATIVE          NEG              mg/dL  Blood                                    NEGATIVE          NEG  Protein                                  NEGATIVE          NEG              mg/dL  Urobilinogen                             0.2               0.0-1.0          mg/dL  Nitrite                                  NEGATIVE  NEG  Leukocytes                               NEGATIVE          NEG   Protime ( Prothrombin Time)              22.1       h      11.6-15.2        seconds  INR                                      1.90       h      0.00-1.49  PTT(a-Partial Thromboplastn Time)        32                 24-37            seconds   Sodium (NA)                              135               135-145          mEq/L  Potassium (K)                            4.1               3.5-5.1          mEq/L  Chloride                                 101               96-112           mEq/L  CO2                                      24                19-32            mEq/L  Glucose                                  418        h      70-99            mg/dL  BUN                                      27         h      6-23             mg/dL  Creatinine                               1.24       h      0.50-1.10  mg/dL    **Please note change in reference range.**  GFR, Est Non African American            42         l      >60              mL/min  GFR, Est African American                51         l      >60              mL/min    Oversized comment, see footnote  1  Calcium                                  9.9               8.4-10.5         Mg/dL   Creatine Kinase, Total                   170               7-177            U/L  CK, MB                                   3.1               0.3-4.0          ng/mL  Relative Index                           1.8               0.0-2.5   Troponin I                               <0.30             <0.30            ng/mL   Imaging results:     PORTABLE CHEST - 1 VIEW    Comparison: None.    Findings: Lungs are clear.  Mild cardiomegaly.  No pneumothorax or   free effusion.    IMPRESSION:   No acute disease.  Other tests:  2D-Echo 05/2010 Study Conclusions   - Left ventricle: The cavity size was mildly dilated. Wall thickness     was increased in a pattern of mild LVH. Systolic function was     severely reduced. The estimated ejection fraction was in the range     of 25% to 30%. Severe diffuse hypokinesis with no identifiable     regional variations. Severe hypokinesis.   - Aortic valve: Mild regurgitation.   - Mitral valve: Calcified annulus.  Moderate regurgitation. No     significant change compared to prior study from July 2011.   - Left atrium: The atrium was mildly dilated.   - Right ventricle: Systolic function was moderately to severely     reduced.   - Right atrium: The atrium was mildly dilated.   Left heart catheterization 05/2010  1. Left main - patent.   2. LAD - 40% to 50% mid  LAD stenosis.   3. OM-2 - ostial 60% stenosis (small branch).   4. RCA - mild luminal irregularities, anterior takeoff - codominant.   5. LVEDP 12 mmHg.   6. LV gram - global hypokinesis, LVEF 20% to 30%, 2+ to 3+ MR.   7. Aortogram - no renal stenosis.   Assessment & Plan by Problem:  Chest Pain: Patient presents with chest pain at rest, which has been ongoing for 2 days, patient has had similar symptoms in the past, has a history of CAD, with last catheterization done on 8/11 which showed 50% LAD and 60% OM2 stenosis. last 2-D echo 8/11 which showed an EF of 25-30%. EKG today showed: PVC's. No new changes since prior EKG, no signs of acute MI, no q waves detected.  Differentials include Angina/ACS/PE/PNA/ MSK Pain/GERD/PUD/ Panic attack/anxiety less likely are PTX and Dissection. Given the patient's history and presentation, patient most likely is having unstable angina.  Cardiologist is Dr. Clarene Duke with [facility]. Plan: -Will admit to SDU -Cycle CE x3 if Positive, consider cardiology consult for possible catheterisation. -12 lead EKG in AM -Will start BBlocker -Full dose ASA -Statin (Crestor) -nitroglycerine drip -morphine  DIABETES MELLITUS, TYPE I - HgbA1C 13.6  (03/2011). Very poorly controlled. Will place patient on home diabetes med of Lantus 20 Units qhs. And also will add sliding scale insulin. Will write for diabetes education as an inpatient.    HYPERTENSION - well controlled. Patient is on clonidine, lisinopril, coreg and amlodipine at home. Will start lisinopril coreg and amlodipine and hold clonidine while here in hospital, will  also hold imdur given that the patient is on NTG drip.   HYPERLIPIDEMIA - Last FLP showed:  Will recheck Lipid panel and liver function tests. We will continue Crestor 20mg  daily. If LDL is higher than 70 consider increasing dose statin (as having a target of <70 of LDL has been shown to reduce longterm CV complications from DM as shown in CARDS trial.  CKD stage 3: Baseline Cr 1.3-1.5, Cr now 1.24, which is about baseline. Will monitor renal fx closely. And continue ACE inhibitor for renal protection

## 2011-04-03 ENCOUNTER — Telehealth: Payer: Self-pay | Admitting: Dietician

## 2011-04-03 NOTE — Telephone Encounter (Signed)
D/C Summary was cut in 2 - one half was assoc with one MRN (and stated Lantus 15 units) and the other half assoc with the other MRN (which stated 20 units Lantus). Will need to ask Dr Anselm Jungling which dosage she intended.  Thanks

## 2011-04-03 NOTE — Telephone Encounter (Signed)
Patient is supposed to take Lantus 20u.  Echart had 15 units but when I looked up her EPIC records prior to discharge, she is supposed to be on 20units and I told patient to take 20u when she goes home.

## 2011-04-03 NOTE — Telephone Encounter (Signed)
Hospital Follow up call:  Patient initially told me her dose of lantus was changed to 30 units, then changed to 20 units. Discharge instructions says 15 units, but last office note she was increased to 20 units. [place] confirmed that patient is taking 20 units Patient reports her blood sugars are: 126 yesterday morning, 300s last night,189 before breakfast today. Patient reports that her blood sugar go higher in the evenings.  There was some confusion about follow up appointments that we were able to clarify. Will see CDE on Monday 04-07-11.  Will route this note to PCP and discharging doctors for clarification on lantus dose. (NOTE: some of the confusion may be that patient has two charts in this system)

## 2011-04-04 NOTE — Telephone Encounter (Signed)
Thank you. That is what she is taking and I will reiterate that on Monday when I see her.

## 2011-04-07 ENCOUNTER — Ambulatory Visit (INDEPENDENT_AMBULATORY_CARE_PROVIDER_SITE_OTHER): Payer: Medicare Other | Admitting: Pharmacist

## 2011-04-07 ENCOUNTER — Ambulatory Visit (INDEPENDENT_AMBULATORY_CARE_PROVIDER_SITE_OTHER): Payer: Medicare Other | Admitting: Dietician

## 2011-04-07 ENCOUNTER — Ambulatory Visit: Payer: Medicare Other

## 2011-04-07 DIAGNOSIS — E1149 Type 2 diabetes mellitus with other diabetic neurological complication: Secondary | ICD-10-CM

## 2011-04-07 DIAGNOSIS — I6340 Cerebral infarction due to embolism of unspecified cerebral artery: Secondary | ICD-10-CM

## 2011-04-07 DIAGNOSIS — Z7901 Long term (current) use of anticoagulants: Secondary | ICD-10-CM

## 2011-04-07 NOTE — Patient Instructions (Signed)
Make a follow up with [name] in 3 months- can see me same day as your doctor.  Recommend no more than 1 pound weight loss each month.  You do not need a bedtime snack if you do  not want one.  Blood sugar was 370 today after lunch- you can talk to your doctor on Thursday about your diabetes.  Bring medicines and meter on Thursday.[name] [phone]

## 2011-04-07 NOTE — Patient Instructions (Signed)
Patient instructed to take medications as defined in the Anti-coagulation Track section of this encounter.  Patient instructed to take today's dose.  Patient verbalized understanding of these instructions.    

## 2011-04-07 NOTE — Progress Notes (Signed)
Diabetes Self-Management Training (DSMT)  Follow-Up 1 Visit  04/07/2011 [name], identified by name and date of birth, is a 77 y.o. female with Type 2 Diabetes. Year of diabetes diagnosis: 80s Other persons present: no, but family very supportive- they dropped her off and are here to pick her up  ASSESSMENT Patient concerns are blood sugars control and how to use insulin pen.  Height 5\' 4"  (1.626 m), weight 167 lb 4.8 oz (75.887 kg), last menstrual period 12/19/1968. Body mass index is 28.72 kg/(m^2). Lab Results  Component Value Date   LDLCALC 64 05/17/2010   Lab Results  Component Value Date   HGBA1C 13.7 03/14/2011   Medication Nutrition Monitor: patient unsure  Labs reviewed.  DIABETES BUNDLE: A1C in past 6 months? Yes.  Less than 7%? No LDL in past year? Yes.  Less than 100 mg/dL? Yes Microalbumin ratio in past year? No. Patient taking ACE or ARB? Yes. Blood pressure less than 130/80? Yes. Foot exam in last year? Yes. Eye exam in past year? No.  Reminded patient to schedule eye exam. Tobacco use? No. Pneumovax? Yes Flu vaccine? Yes Asprin? Yes  Family history of diabetes: Yes  Support systems: family- grandchildren  Special needs: Large print, patient reports memory problems  Prior DM Education: Yes   Medications See Medications list.  Has adequate knowledge  Patients belief/attitude about diabetes: Diabetes can be controlled.  Self foot exams daily: No- granddaughter inspect weekly  Diabetes Complications: Nephropathy Gastroparesis   Exercise Plan Doing ADLs and says she does chari exercises and gets out for walks when she has someone to go with her.   Self-Monitoring Frequency of testing: 1-2 times/day Breakfast: 190-220 Supper: 300s  Bedtime: 300s  patient blood glucose goal 115- 250, gets concerned about 300-400. Disagrees with doctor goal ofblood sugar 200-300s.  This am - 220, yesterday 190 before breakfast, 300s in evening     Hyperglycemia: Yes Daily Hypoglycemia: No   Meal Planning Some knowledge and Interested in improving Assessment comments: intake reported is appropriate , patient with significant weight loss ( 23#) over the past year. She would like to be 145# Cautioned patient not to decrease any more than 1 # per month.    INDIVIDUAL DIABETES EDUCATION PLAN:  Medication Monitoring _______________________________________________________________________  Intervention TOPICS COVERED TODAY:  Medication  reveiwed insulin injection technique and use of insulin pen. Feel patient could benefit from use of pen if she became comfortable with it, but she again was not ready for change today.  Monitoring  Identified appropriate SMBG and A1C goals. see comments  PATIENTS GOALS/PLAN (copy and paste in patient instructions so patient receives a copy): 1.  Learning Objective:       Know how to use proper insulin pen technique 2.  Behavioral Objective:         Medications: To improve blood glucose levels, I will use insulin pen to give insulin injection Never 0% Monitoring: To identify blood glucose trends, I will bring meter to all clinic visits Half of the time 50%  Personalized Follow-Up Plan for Ongoing Self Management Support:  Doctor's Office, friends, family and CDE visits ______________________________________________________________________   Outcomes Expected outcomes: Demonstrated interest in learning.Expect positive changes in lifestyle.  Self-care Barriers: Hard of hearing, Impaired vision, Debilitated state due to current medical condition, Lack of material resources  Education material provided: patient instructions  Patient to contact team via Phone if problems or questions.  Time in: 1530     Time out: 1600  Future DSMT - 3-4 months   [name]

## 2011-04-07 NOTE — Progress Notes (Signed)
Anti-Coagulation Progress Note  [name] is a 77 y.o. female who is currently on an anti-coagulation regimen.    RECENT RESULTS: Recent results are below, the most recent result is correlated with a dose of 47.5 mg. per week: Lab Results  Component Value Date   INR 2.0 04/07/2011   INR 2.3 03/18/2011   INR 2.4 03/03/2011    ANTI-COAG DOSE:   Latest dosing instructions   Total Sun Mon Tue Wed Thu Fri Sat   47.5 5 mg 7.5 mg 7.5 mg 7.5 mg 7.5 mg 7.5 mg 5 mg    (5 mg1) (5 mg1.5) (5 mg1.5) (5 mg1.5) (5 mg1.5) (5 mg1.5) (5 mg1)         ANTICOAG SUMMARY: Anticoagulation Episode Summary              Current INR goal 2.0-3.0 Next INR check 04/28/2011   INR from last check 2.0 (04/07/2011)     Weekly max dose (mg)  Target end date Indefinite   Indications CEREBRAL EMBOLISM, WITH INFARCTION, Long term current use of anticoagulant   INR check location [facility] Preferred lab    Send INR reminders to [facility]   Comments        Provider Role Specialty Phone number   Blanch Media  Internal Medicine [phone]        ANTICOAG TODAY: Anticoagulation Summary as of 04/07/2011              INR goal 2.0-3.0     Selected INR 2.0 (04/07/2011) Next INR check 04/28/2011   Weekly max dose (mg)  Target end date Indefinite   Indications CEREBRAL EMBOLISM, WITH INFARCTION, Long term current use of anticoagulant    Anticoagulation Episode Summary              INR check location [facility] Preferred lab    Send INR reminders to [facility]   Comments        Provider Role Specialty Phone number   Blanch Media  Internal Medicine [phone]        PATIENT INSTRUCTIONS: Patient Instructions  Patient instructed to take medications as defined in the Anti-coagulation Track section of this encounter.  Patient instructed to take today's dose.  Patient verbalized understanding of these instructions.        FOLLOW-UP Return in 3 weeks (on 04/28/2011) for  Follow up INR.  Hulen Luster, III Pharm.D., CACP

## 2011-04-09 ENCOUNTER — Ambulatory Visit: Payer: Medicare Other | Admitting: Internal Medicine

## 2011-04-10 ENCOUNTER — Encounter: Payer: Self-pay | Admitting: Internal Medicine

## 2011-04-10 ENCOUNTER — Other Ambulatory Visit: Payer: Self-pay | Admitting: Internal Medicine

## 2011-04-10 ENCOUNTER — Ambulatory Visit (INDEPENDENT_AMBULATORY_CARE_PROVIDER_SITE_OTHER): Payer: Medicare Other | Admitting: Internal Medicine

## 2011-04-10 DIAGNOSIS — E119 Type 2 diabetes mellitus without complications: Secondary | ICD-10-CM

## 2011-04-10 DIAGNOSIS — R079 Chest pain, unspecified: Secondary | ICD-10-CM

## 2011-04-10 DIAGNOSIS — Z0000 Encounter for general adult medical examination without abnormal findings: Secondary | ICD-10-CM

## 2011-04-10 DIAGNOSIS — Z23 Encounter for immunization: Secondary | ICD-10-CM

## 2011-04-10 DIAGNOSIS — R300 Dysuria: Secondary | ICD-10-CM

## 2011-04-10 DIAGNOSIS — R51 Headache: Secondary | ICD-10-CM

## 2011-04-10 DIAGNOSIS — J019 Acute sinusitis, unspecified: Secondary | ICD-10-CM

## 2011-04-10 MED ORDER — ALBUTEROL SULFATE HFA 108 (90 BASE) MCG/ACT IN AERS: 2.0000 | INHALATION_SPRAY | RESPIRATORY_TRACT | Status: DC | PRN

## 2011-04-10 MED ORDER — GLUCOSE BLOOD VI STRP: ORAL_STRIP | Status: DC

## 2011-04-10 MED ORDER — INSULIN GLARGINE 100 UNIT/ML ~~LOC~~ SOLN: 26.0000 [IU] | Freq: Every day | SUBCUTANEOUS | Status: DC

## 2011-04-10 NOTE — Progress Notes (Signed)
  Subjective:    Patient ID: [name], female    DOB: 11-Jan-1933, 77 y.o.   MRN: [id number]  HPI Please see the A&P for the status of the pt's chronic medical problems.   Review of Systems  Constitutional: Negative for fever, chills, diaphoresis, activity change, appetite change, fatigue and unexpected weight change.  HENT: Negative for hearing loss, congestion, sneezing, neck stiffness, postnasal drip and sinus pressure.   Eyes: Negative for photophobia, pain and visual disturbance.  Respiratory: Negative for cough, chest tightness, shortness of breath and wheezing.   Cardiovascular: Negative for chest pain and palpitations.  Gastrointestinal: Negative for abdominal pain, blood in stool and anal bleeding.  Genitourinary: Negative for dysuria, frequency, hematuria, flank pain, decreased urine volume and difficulty urinating.  Musculoskeletal: Negative for joint swelling.  Neurological: Negative for dizziness, syncope, speech difficulty, weakness, numbness and headaches.       Objective:   Physical Exam      VItal signs reviewed and stable. GEN: No apparent distress.  Alert and oriented x 3.  Pleasant, conversant, and cooperative to exam. HEENT: head is autraumatic and normocephalic.  Neck is supple without palpable masses or lymphadenopathy.  No JVD or carotid bruits.  Vision intact.  EOMI.  PERRLA.  Sclerae anicteric.  Conjunctivae without pallor or injection. Mucous membranes are moist.  Oropharynx is without erythema, exudates, or other abnormal lesions; increased mucoid drainage noted in posterior oropharynx. Mild tenderness to palpation of bilateral maxillary sinuses RESP:  Lungs are clear to ascultation bilaterally with good air movement.  No wheezes, ronchi, or rubs. CARDIOVASCULAR: regular rate, normal rhythm.  Clear S1, S2, 3/6 systolic murmurs, gallops, or rubs. ABDOMEN: soft, non-tender, non-distended.  Bowels sounds present in all quadrants and normoactive.  No palpable  masses. EXT: warm and dry.  Peripheral pulses equal, intact, and +2 globally.  No clubbing or cyanosis.  Trace edema in bilateral lower extremities. SKIN: warm and dry with normal turgor.  No rashes or abnormal lesions observed. NEURO: CN II-XII grossly intact.  Muscle strength +5/5 in bilateral upper and lower extremities.  Sensation is grossly intact.  No focal deficit.   Assessment & Plan:

## 2011-04-10 NOTE — Patient Instructions (Signed)
Schedule a followup appointment with Dr. Arvilla Market in 3 months, sooner if needed. Increase your Lantus to 26 units at night.  Keep checking her blood sugars regularly. If your sugars remain elevated above 200, increase your Lantus to 30 units at night. If you have any blood sugars below 70, decrease your Lantus to 26 units and call the clinic. A prescription for blood sugar strips was sent to [facility] drugs. A refill for your inhaler was sent to [facility] drug.

## 2011-04-10 NOTE — Assessment & Plan Note (Addendum)
Pt states she is feeling a lot better.  She does not have any more fevers.   She feels that the amoxicillin has resolved her symptoms of nasal congestion and sinus pressure as well.

## 2011-04-10 NOTE — Assessment & Plan Note (Signed)
Pt was discharged from [facility] on 03/31/11 after an overnight stay for chest pain. Work up was negative for acute MI but concerning for unstable angina.  She has an appt with her cardiologist, Dr. Clarene Duke on April 14, 2011.  She denies any episodes of chest pain or significant SOB since d/c home; she has not use any NTG since June 17.

## 2011-04-10 NOTE — Assessment & Plan Note (Addendum)
Pt has been taking 20u of Lantus since her visit.  Her CBGS remain elevated; averaging in the 300s.  She states she is very glad to be using insulin bowels again as this is but she is here for her to administer.  Will increase her Lantus to 26 units each bedtime.  She would like to increase her Lantus even further if she does not feel that increasing to 6 units will provide significant improvement in control of her CBGs.  I have asked her to start with 26 units and if her CBGs continue to remain elevated above 200 that she may slowly increased to a maximum of 30 units at bedtime. She is advised to continue checking her CBGs regularly at least 3 times a day. She is advised that if she experiences any CBGs less than or equal to 70 that she can notify the clinic to make an appointment and decrease her Lantus back to the 26 units.   She is able to tell me the appropriate treatment for hypoglycemia and states that she is always sure to recheck her blood sugar at least 30 minutes to one hour after a hypoglycemic event.

## 2011-04-15 ENCOUNTER — Other Ambulatory Visit: Payer: Self-pay | Admitting: Internal Medicine

## 2011-04-28 ENCOUNTER — Ambulatory Visit (INDEPENDENT_AMBULATORY_CARE_PROVIDER_SITE_OTHER): Payer: Medicare Other | Admitting: Pharmacist

## 2011-04-28 DIAGNOSIS — I6340 Cerebral infarction due to embolism of unspecified cerebral artery: Secondary | ICD-10-CM

## 2011-04-28 DIAGNOSIS — Z7901 Long term (current) use of anticoagulants: Secondary | ICD-10-CM

## 2011-04-28 LAB — POCT INR: INR: 2.8

## 2011-04-28 NOTE — Progress Notes (Signed)
Anti-Coagulation Progress Note  [name] is a 77 y.o. female who is currently on an anti-coagulation regimen.    RECENT RESULTS: Recent results are below, the most recent result is correlated with a dose of 47.5 mg. per week: Lab Results  Component Value Date   INR 2.80 04/28/2011   INR 2.0 04/07/2011   INR 1.90* 03/30/2011    ANTI-COAG DOSE:   Latest dosing instructions   Total Sun Mon Tue Wed Thu Fri Sat   45 5 mg 7.5 mg 7.5 mg 5 mg 7.5 mg 7.5 mg 5 mg    (5 mg1) (5 mg1.5) (5 mg1.5) (5 mg1) (5 mg1.5) (5 mg1.5) (5 mg1)         ANTICOAG SUMMARY: Anticoagulation Episode Summary              Current INR goal 2.0-3.0 Next INR check 05/26/2011   INR from last check 2.80 (04/28/2011)     Weekly max dose (mg)  Target end date Indefinite   Indications CEREBRAL EMBOLISM, WITH INFARCTION, Long term current use of anticoagulant   INR check location [facility] lab    Send INR reminders to [facility]   Comments        Provider Role Specialty Phone number   Blanch Media  Internal Medicine [phone]        ANTICOAG TODAY: Anticoagulation Summary as of 04/28/2011              INR goal 2.0-3.0     Selected INR 2.80 (04/28/2011) Next INR check 05/26/2011   Weekly max dose (mg)  Target end date Indefinite   Indications CEREBRAL EMBOLISM, WITH INFARCTION, Long term current use of anticoagulant    Anticoagulation Episode Summary              INR check location [facility] Preferred lab    Send INR reminders to [facility]   Comments        Provider Role Specialty Phone number   Blanch Media  Internal Medicine [phone]        PATIENT INSTRUCTIONS: Patient Instructions  Patient instructed to take medications as defined in the Anti-coagulation Track section of this encounter.  Patient instructed to take today's dose.  Patient verbalized understanding of these instructions.        FOLLOW-UP Return in 4 weeks (on 05/26/2011) for  Follow up INR.  Hulen Luster, III Pharm.D., CACP

## 2011-04-28 NOTE — Patient Instructions (Signed)
Patient instructed to take medications as defined in the Anti-coagulation Track section of this encounter.  Patient instructed to take today's dose.  Patient verbalized understanding of these instructions.    

## 2011-05-01 DIAGNOSIS — R300 Dysuria: Secondary | ICD-10-CM | POA: Insufficient documentation

## 2011-05-01 NOTE — Assessment & Plan Note (Signed)
Patient's symptoms of dysuria are resolved. Her urinalysis was not consistent with a UTI.

## 2011-05-01 NOTE — Assessment & Plan Note (Signed)
Patient notes that she hasn't been experiencing dysuria for the past few days. She denies hematuria, fever, chills, nausea, vomiting, or diarrhea. She denies abdominal pain. She admits to some increased frequency but no hesitation. Some of her symptoms may be related to her hyperglycemia however I would not expect this to cause painful urination. Will send the urine for a routine analysis as well as culture and treat if indicated. I will see her back in 2 weeks and assess her symptoms at that time.

## 2011-05-01 NOTE — Assessment & Plan Note (Signed)
Patient's symptoms have not sit immediately improved following treatment with doxycycline. She continues to report nasal congestion and drainage. Will try a second course of antibiotics with Augmentin; this will provide broader antimicrobial coverage. I will followup her response to this course of treatment at her next visit in 2 weeks.

## 2011-05-15 NOTE — Discharge Summary (Signed)
[name], [name]                 ACCOUNT NO.:  [id number]  MEDICAL RECORD NO.:  [id number]  LOCATION:                                 FACILITY:  PHYSICIAN:  [name], M.D.DATE OF BIRTH:  [birth year]  DATE OF ADMISSION:  03/31/2011 DATE OF DISCHARGE:  03/31/2011                              DISCHARGE SUMMARY   ADDENDUM  CONSULTATION:  None.  PROCEDURE PERFORMED: 1. EKG on March 30, 2011, did show rate of 73, normal axis, no ST or T-     wave abnormalities to suggest acute infarct. 2. Chest x-ray on March 30, 2011, shows no acute disease.  Lungs are     clear, mild cardiomegaly.  No pneumothorax or free effusion.  ADMISSION HISTORY:  [name] is a 77 year old woman who presented to the [facility] ED with complaints of chest pain and hyperglycemia.  She stated that her blood sugar had been going up over the past few days, but today she has noticed that her meter would not read her blood sugar because that was so high.  She stated that she had been having ceased in the past more often, but denies any nausea, vomiting, or abdominal pain. She also stated that she has had problem with chest pain since the day prior to admission.  The pain has been episodic and centered under her left breast and moving up into her left shoulder.  She stated that she was lying in bed yesterday and felt a doughy in her chest that lasted a few minutes.  She had several episodes of chest pain but worst pain was in the afternoon of admission when she had an episode of a sharp, sudden pain in the same distribution that was associated with shortness of breath when she was lying in bed.  She denies any diaphoresis, worsening with activity, abdominal pain, swelling in her legs, or orthopnea.  ADMISSION PHYSICAL EXAMINATION:  VITAL SIGNS:  Temperature 97.3, blood pressure 148/100, heart rate 62, respirations 19, O2 sat 100% on 2 liters. GENERAL:  The patient was alert, well developed, and  cooperative to examination. HEENT:  Head, normocephalic, atraumatic.  Eyes, vision grossly intact. Pupils equal, round, and reactive to light and accommodation.  No injection.  Anicteric. Mouth, pharynx pink, moist.  No erythema or exudate. NECK:  Supple.  Full range of motion.  No thyromegaly, no JVD, no carotid bruits. LUNGS:  Normal respiratory effort.  No accessory muscle use.  Normal breath sounds.  No crackles or wheezes. HEART:  Normal rate, regular rhythm, 3/6 systolic murmur best heard at the apex.  No gallops or rubs. ABDOMEN:  Soft, nontender, nondistended.  Normal bowel sounds.  No guarding, no rebound tenderness.  No organomegaly. MUSCULOSKELETAL:  No joint swelling, no joint warmth, or no redness over joints. EXTREMITIES:  2+ DP/PT pulses bilaterally.  No cyanosis or clubbing. Trace edema in bilateral lower extremities. NEUROLOGIC:  Alert and oriented x3.  Cranial nerves II through XII grossly intact.  Strength normal in all extremities.  Intact sensation to light touch.  Gait normal. SKIN:  Normal turgor.  No rashes. PSYCHIATRIC:  Oriented x3.  Memory intact for recent and remote.  Normal interactive, good eye contact, not anxious appearing, and not depressed appearing.  ADMISSION LABS:  WBC 5.7, hemoglobin 12.5, hematocrit 37.3, MCV 82.3, platelet 183.  Sodium 135, potassium 4.1, chloride 101, bicarb 24, glucose 418, BUN 27, creatinine 1.24, GFR 51, calcium 9.9.  PT 22.1, INR 1.90, PTT 32.  Urinalysis shows greater than 1000 glucose, otherwise negative.  CK total 170, CK-MB 3.1, troponin less than 0.30.  HOSPITAL COURSE: 1. Chest pain, likely unstable angina given the patient's history and     clinical presentation.  This patient presented with chest pain     arrest, which has been going on for 2 days and she did have similar     symptoms in the past and a history of CAD.  She also had a heart     cath in August 2011, which shows 50% LAD and 60% OM-2 stenosis.      Last 2D echo also in August 2011 shows an EF of 25-30%.  On EKG,     there are PVCs, no new changes since prior EKG, no sign of acute     MI, no Q-waves, no ST or T-wave abnormalities.  The patient was     admitted to Step-Down Unit, cardiac enzymes x3 were all negative.     In addition, the patient was started on nitro drip as well as     heparin drip which was discontinued, and the patient was continued     on a beta-blocker, aspirin 81 mg p.o. daily, Crestor 20 mg p.o.     daily.  The patient also received morphine p.r.n. for chest pain.     Her chest pain has resolved throughout this hospital course and the     patient will have a followup appointment with her PCP and Dr. Arvilla Hurley     on April 09, 2011, at 9:45 and with Dr. Clarene Hurley, Cardiology on April 14, 2011, at 3:20 p.m. 2. Diabetes type 1.  Hemoglobin A1c of 13.6 on June 2012.  Very poorly     controlled.  The patient was placed on Lantus 20 units and was also     placed on sensitive sliding scale.  The patient also received     diabetic education as an inpatient.  Her CBGs ranged from 139 to     200 with one exception of 493 on admission. 3. Hypertension, well controlled.  The patient is on clonidine,     lisinopril, Coreg, and amlodipine at home.  The patient was resumed     on her lisinopril, Coreg, and amlodipine.  Clonidine and Imdur were     held during the hospital course given that the patient was     initially on nitroglycerin drip.  We will resume all her home     medications upon discharge. 4. Hyperlipidemia.  We continued her Crestor 20 mg p.o. daily.  We     rechecked her lipid panel and liver function tests.  Her lipid     panel shows a total cholesterol of 128, triglyceride 32, HDL 78,     LDL 44. 5. Chronic kidney disease, stage III with a baseline of 1.3-1.5,     creatinine on admission was 1.24 which is above her baseline.  We     continued her ACE inhibitor for her renal protection and continued     to monitor  her BMET closely.  DISCHARGE VITAL SIGNS:  Temperature 98.4, blood pressure 131/73, pulse 77, respirations 16,  O2 sat 99% on room air.  DISCHARGE LABS:  Sodium 141, potassium 3.8, chloride 108, bicarb 26, glucose 197, BUN 20, creatinine 0.95, calcium 8.8.  Total protein 6.1, albumin 3.0, AST 15, ALT 19, T-bili 0.3, alk phos 63.  Troponin less than 0.3, CPK 129, CK-MB 2.6.  Total cholesterol 128, HDL 78, LDL 44, triglyceride 32.  Hemoglobin 10.7, hematocrit 32.1, WBC 5.5, platelet 166.   DISCHARGE MEDICATIONS:  Lantus 20 units subcu at bedtime.    ______________________________ Carrolyn Meiers, MD   ______________________________ [name], M.D.    MH/MEDQ  D:  03/31/2011  T:  04/01/2011  Job:  [id number]  Electronically Signed by Carrolyn Meiers MD on 04/28/2011 09:11:01 AM Electronically Signed by [name] M.D. on 05/15/2011 10:30:41 AM

## 2011-05-15 NOTE — Discharge Summary (Addendum)
[name], [name] NO.:  [id number]  MEDICAL RECORD NO.:  [id number]  LOCATION:                                 FACILITY:  PHYSICIAN:  Blanch Media, M.D.DATE OF BIRTH:  1932/12/05  DATE OF ADMISSION:  03/31/2011 DATE OF DISCHARGE:  03/31/2011                              DISCHARGE SUMMARY   DISCHARGE DIAGNOSES: 1. Chest pain likely unstable angina. 2. Diabetes type 1. 3. Hypertension. 4. Hyperlipidemia. 5. Chronic kidney disease, stage III with baseline creatinine of 1.3-     1.5.  DISCHARGE MEDICATIONS: 1. Aspirin 81 mg p.o. daily 2. Albuterol inhaler 2 puffs q.4 p.r.n. shortness of breath or     wheezing. 3. Amlodipine 10 mg p.o. daily 4. Calcium carbonate/vitamin D 500/125 mg 1 tablet p.o. t.i.d. 5. Carvedilol 12.5 mg p.o. b.i.d. 6. Crestor 20 mg p.o. daily 7. Fluticasone nasal 50 mcg spray, 2 sprays in each nostril daily as     needed for sinus. 8. Isosorbide mononitrate XR 60 mg p.o. daily 9. Lantus 20 units subcu at bedtime. 10.Lisinopril 40 mg p.o. daily. 11.Loratadine 10 mg p.o. daily 12.Senna 8.6 mg 2 tablets daily p.r.n. constipation. 13.Warfarin 5 mg p.o. daily, please see special instruction. 14.Ambien 5 mg p.o. nightly p.r.n. insomnia.  DISPOSITION AND FOLLOWUP:  [name] was discharged from [facility] on March 31, 2011, in stable and improved condition.  She has no other episodes of chest pain, nausea and vomiting or diaphoresis.  She will need to continue to take her aspirin 81 mg p.o. daily.  She will have a follow up appointment with her PCP, Dr. Nelda Bucks on April 09, 2011, at 9:45.  In addition, she will have a follow up appointment with her cardiologist Dr. Clarene Duke on April 14, 2011, at 3:20 p.m.  Upon follow up the following should be addressed: 1. Reevaluate her chest pain.  [name] may need a stress test as an     outpatient. 2. Follow up on her CBG and adjust her insulin dosing if needed.  Her  hemoglobin A1c in June 2012 was 13.6, poorly controlled.  CONSULTATION:  None.  PROCEDURE PERFORMED: 1. Portable chest x-ray on March 31, 2011, shows no acute disease.  2. EKG on March 30, 2011, shows normal sinus rhythm, no acute ST or T     wave abnormalities.  EKG on March 31, 2011, at 7:03 a.m. showed     normal sinus rhythm with a heart rate of 60, no acute ST or T wave     abnormalities to suggest acute ischemia.  ADMISSION HISTORY:  [name] is a 77 year old woman who presented to the [facility] ED with complaints of chest pain and hyperglycemia.  She stated that her blood sugar has been going up over the last few days, but noticed on the day of admission that her meter would not read her blood sugar because it was too high.  She stated that she has been having to use the bathroom more often, but denies any nausea, vomiting or abdominal pain.  She also stated that she has had problem with chest pain 1 day prior to admission.  The  pain has been episodic, centered under her left breast and movement up into her left shoulder.  She stated that she was laying in bed yesterday and felt like a dull ache in her chest that lasted a few minutes.  She has had several episodes of chest pain, but worst pain on the afternoon of admission when she had an episode of a sharp, sudden pain in the same distribution that was associated with her shortness of breath when she was lying in bed.  She denies any diaphoresis, worsening with activity, abdominal pain, swelling of her legs or orthopnea.  ADMISSION PHYSICAL EXAMINATION: VITAL SIGNS:  Temperature 97.3, blood pressure 148/100, heart rate 62,  respirations 19, O2 sat 100% on 2 liters of nasal cannula. GENERAL:  Alert, well developed and cooperative to examination. HEENT:  Normocephalic and atraumatic.  Vision grossly intact.  Pupils  equal, round, reactive to light and accommodation, no injection, anicteric.   Pharynx pink and moist.  No  erythema and exudate. NECK:  Supple, full range of motion.  No thyromegaly, JVD, or carotid  bruits. LUNGS:  Normal respiratory effort, no accessory muscle use, normal breath sounds, no crackle or wheezes. HEART:  Normal rate, regular rhythm, 3/6 systolic murmur best heard at apex, no gallops or rubs. ABDOMEN:  Soft, nontender, nondistended.  Normal bowel sounds.  No guarding, rebound tenderness, or organomegaly. MUSCULOSKELETAL:  No swelling, warmth or redness over the joints. PULSES:  2+ DP/PT pulses bilaterally. EXTREMITIES:  No cyanosis or clubbing.  Trace edema in bilateral lower extremities. NEUROLOGIC:  Alert and oriented x 3.  Cranial nerves II through XII grossly intact, strength normal in all extremities, sensation intact to light touch, gait normal. SKIN:  Turgor normal.  No rashes. PSYCH:  Oriented x 3, memory intact for recent and remote, normally interactive, good eye contact, not anxious or depressed appearing.  HOSPITAL ADMISSION LABS:  WBC 5.7, hemoglobin 12.5, hematocrit 37.3, platelets 183, neutrophil 46%, ANC 2.6.  Sodium 135, potassium 4.1, chloride 101, bicarb 24, BUN 27, creatinine 1.24, glucose 418, calcium 9.9.  PT 22.1, INR 1.90, PTT 32, CK total 170, CK-MB 3.1, relative index 1.8, troponin less than 0.3.  Urinalysis shows urine glucose greater than 1000, otherwise negative for leukocytes and nitrites.  HOSPITAL COURSE: 1. Chest pain, likely unstable angina, given that [name] had chest      pain at rest of 2-days duration and has a history of CAD.  The      differential includes PE, pneumonia, musculoskeletal, GERD, PUD,      panic attack/anxiety, pneumothorax or dissection all of which      were less likely clinically.  Her chest x-ray was negative.  EKG      on admission showed few PVCs, with no new changes from the prior      EKG, no sign of acute MI or Q-waves. Cardiac enzymes x 3 were all      negative.  She was admitted to the Step-Down Unit, and  started on      a beta-blocker, aspirin, statin, and morphine.  She was initially      started on heparin and nitro drips which were discontinued on the      morning of March 31, 2011 as her chest pain did resolve.  She will     have a follow up appointment with Dr. Julieanne Manson on April 14, 2011,     at 3:20 p.m. for follow-up.  He may consider a stress echo as  outpatient.  2. Diabetes with a hemoglobin of 13.6 on June 2012 suggesting very     poor control.  Ms. Blane was placed on Lantus 20 units     nightly as her home medication and we also added sliding scale     insulin.  She received inpatient diabetic education.  Her CBG      ranged from 139 to 493 throughout her hospital course.  Her PCP     will follow up on her blood sugar and adjust her medication as     appropriate.  4. Hypertension, well controlled.  On admission, she was     started on lisinopril, Coreg and amlodipine.  We held clonidine     while she was in the hospital as well as the Imdur because she     was placed on a nitro drip initially.  She will resume all     her home medications at discharge.  5. Hyperlipidemia.  Fasting lipids show a cholesterol of 128,     triglyceride 32, HDL 78, LDL 44.  We continue her Crestor home dose     20 mg p.o. daily.  (In this patient, the target goal for LDL would     be less than 70 as it has been shown to reduce long-term     cardiovascular complications from diabetes as shown in the CARDS     trial).  6. CKD, stage III:.  Creatinine was 1.24 on admission which is baseline.       We continued to monitor her renal function closely and continue her      ACE inhibitor for renal protection.  7. 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  PT/INR was near therapeutic range with a     INR of 1.90.  We continued her warfarin during the hospital stay.  DISCHARGE VITALS:  stable  DISCHARGE LAB RESULTS:  WBC 5.5, hemoglobin 10.7,  hematocrit 32.1, platelets 166.  Sodium 141, potassium 3.8, chloride 108, bicarb 26, BUN 20, creatinine 0.95, glucose 197, total bili 0.3, alk phos 62, AST 15, ALT 19, total protein 6.1, albumin 3.0, calcium 8.8, total cholesterol 128, triglyceride 32, HDL 78, LDL 44.  Cardiac enzymes x 3 negative.  Discharge summary was reviewed and signed by Rocco Serene, MD for  Dr. Rogelia Boga.   ______________________________ Carrolyn Meiers, MD   ______________________________ Blanch Media, M.D.   MH/MEDQ  D:  04/04/2011  T:  04/05/2011  Job:  [id number]  cc:   Nelda Bucks, MD Thereasa Solo. Little, M.D.  Electronically Signed by Carrolyn Meiers MD on 05/15/2011 12:17:42 PM Electronically Signed by Doneen Poisson  on 05/22/2011 08:55:20 AM

## 2011-05-26 ENCOUNTER — Ambulatory Visit: Payer: Self-pay

## 2011-06-05 ENCOUNTER — Ambulatory Visit (INDEPENDENT_AMBULATORY_CARE_PROVIDER_SITE_OTHER): Payer: Medicare Other | Admitting: Pharmacist

## 2011-06-05 DIAGNOSIS — I6340 Cerebral infarction due to embolism of unspecified cerebral artery: Secondary | ICD-10-CM

## 2011-06-05 DIAGNOSIS — Z7901 Long term (current) use of anticoagulants: Secondary | ICD-10-CM

## 2011-06-05 LAB — POCT INR: INR: 2.1

## 2011-06-05 NOTE — Patient Instructions (Signed)
Patient instructed to take medications as defined in the Anti-coagulation Track section of this encounter.  Patient instructed to take today's dose.  Patient verbalized understanding of these instructions.    

## 2011-06-05 NOTE — Progress Notes (Signed)
Anti-Coagulation Progress Note  [name] is a 76 y.o. female who is currently on an anti-coagulation regimen.    RECENT RESULTS: Recent results are below, the most recent result is correlated with a dose of 45 mg. per week: Lab Results  Component Value Date   INR 2.10 06/05/2011   INR 2.80 04/28/2011   INR 2.0 04/07/2011    ANTI-COAG DOSE:   Latest dosing instructions   Total Sun Mon Tue Wed Thu Fri Sat   47.5 7.5 mg 5 mg 7.5 mg 7.5 mg 5 mg 7.5 mg 7.5 mg    (5 mg1.5) (5 mg1) (5 mg1.5) (5 mg1.5) (5 mg1) (5 mg1.5) (5 mg1.5)         ANTICOAG SUMMARY: Anticoagulation Episode Summary              Current INR goal 2.0-3.0 Next INR check 06/30/2011   INR from last check 2.10 (06/05/2011)     Weekly max dose (mg)  Target end date Indefinite   Indications CEREBRAL EMBOLISM, WITH INFARCTION, Long term current use of anticoagulant   INR check location [facility] Preferred lab    Send INR reminders to [facility]   Comments        Provider Role Specialty Phone number   Blanch Media  Internal Medicine [phone]        ANTICOAG TODAY: Anticoagulation Summary as of 06/05/2011              INR goal 2.0-3.0     Selected INR 2.10 (06/05/2011) Next INR check 06/30/2011   Weekly max dose (mg)  Target end date Indefinite   Indications CEREBRAL EMBOLISM, WITH INFARCTION, Long term current use of anticoagulant    Anticoagulation Episode Summary              INR check location [facility] Preferred lab    Send INR reminders to [facility]   Comments        Provider Role Specialty Phone number   Blanch Media  Internal Medicine [phone]        PATIENT INSTRUCTIONS: Patient Instructions  Patient instructed to take medications as defined in the Anti-coagulation Track section of this encounter.  Patient instructed to take today's dose.  Patient verbalized understanding of these instructions.        FOLLOW-UP Return for Follow up INR.  Hulen Luster, III Pharm.D., CACP

## 2011-06-18 ENCOUNTER — Other Ambulatory Visit: Payer: Self-pay | Admitting: Internal Medicine

## 2011-06-30 ENCOUNTER — Ambulatory Visit: Payer: Self-pay

## 2011-07-03 ENCOUNTER — Encounter: Payer: Self-pay | Admitting: Internal Medicine

## 2011-07-03 ENCOUNTER — Ambulatory Visit (INDEPENDENT_AMBULATORY_CARE_PROVIDER_SITE_OTHER): Payer: Medicare Other | Admitting: Pharmacist

## 2011-07-03 DIAGNOSIS — Z7901 Long term (current) use of anticoagulants: Secondary | ICD-10-CM

## 2011-07-03 DIAGNOSIS — I6340 Cerebral infarction due to embolism of unspecified cerebral artery: Secondary | ICD-10-CM

## 2011-07-03 NOTE — Progress Notes (Signed)
Anti-Coagulation Progress Note  [name] is a 77 y.o. female who is currently on an anti-coagulation regimen.    RECENT RESULTS: Recent results are below, the most recent result is correlated with a dose of 47.5 mg. per week: Lab Results  Component Value Date   INR 3.0 07/03/2011   INR 2.10 06/05/2011   INR 2.80 04/28/2011    ANTI-COAG DOSE:   Latest dosing instructions   Total Sun Mon Tue Wed Thu Fri Sat   42.5 5 mg 5 mg 7.5 mg 5 mg 7.5 mg 5 mg 7.5 mg    (5 mg1) (5 mg1) (5 mg1.5) (5 mg1) (5 mg1.5) (5 mg1) (5 mg1.5)         ANTICOAG SUMMARY: Anticoagulation Episode Summary              Current INR goal 2.0-3.0 Next INR check 07/28/2011   INR from last check 3.0 (07/03/2011)     Weekly max dose (mg)  Target end date Indefinite   Indications CEREBRAL EMBOLISM, WITH INFARCTION, Long term current use of anticoagulant   INR check location [facility] Preferred lab    Send INR reminders to [facility]   Comments        Provider Role Specialty Phone number   Blanch Media  Internal Medicine [phone]        ANTICOAG TODAY: Anticoagulation Summary as of 07/03/2011              INR goal 2.0-3.0     Selected INR 3.0 (07/03/2011) Next INR check 07/28/2011   Weekly max dose (mg)  Target end date Indefinite   Indications CEREBRAL EMBOLISM, WITH INFARCTION, Long term current use of anticoagulant    Anticoagulation Episode Summary              INR check location [facility] Preferred lab    Send INR reminders to [facility]   Comments        Provider Role Specialty Phone number   Blanch Media  Internal Medicine [phone]        PATIENT INSTRUCTIONS: Patient Instructions  Patient instructed to take medications as defined in the Anti-coagulation Track section of this encounter.  Patient instructed to OMIT today's dose.  Patient verbalized understanding of these instructions.        FOLLOW-UP Return in 4 weeks (on 07/28/2011) for  Follow up INR.  Hulen Luster, III Pharm.D., CACP

## 2011-07-03 NOTE — Patient Instructions (Signed)
Patient instructed to take medications as defined in the Anti-coagulation Track section of this encounter.  Patient instructed to OMIT today's dose.  Patient verbalized understanding of these instructions.    

## 2011-07-04 ENCOUNTER — Other Ambulatory Visit: Payer: Self-pay | Admitting: Internal Medicine

## 2011-07-07 LAB — POCT CARDIAC MARKERS
CKMB, poc: 1.3
CKMB, poc: 1.3
Myoglobin, poc: 54.8
Troponin i, poc: <0.05
Troponin i, poc: <0.05

## 2011-07-07 LAB — POCT I-STAT, CHEM 8
Calcium, Ion: 1.15
Creatinine, Ser: 1.2
Glucose, Bld: 140 — ABNORMAL HIGH
HCT: 40
Hemoglobin: 13.6
Potassium: 3.2 — ABNORMAL LOW

## 2011-07-07 LAB — URINALYSIS, ROUTINE W REFLEX MICROSCOPIC
Bilirubin Urine: NEGATIVE
Hgb urine dipstick: NEGATIVE
Ketones, ur: NEGATIVE
Nitrite: NEGATIVE
Specific Gravity, Urine: 1.007
Urobilinogen, UA: 0.2
pH: 8

## 2011-07-07 LAB — URINE MICROSCOPIC-ADD ON

## 2011-07-07 NOTE — Telephone Encounter (Signed)
Can you please confirm that her pharmacy received refill rx?  Thanks!

## 2011-07-14 LAB — RAPID URINE DRUG SCREEN, HOSP PERFORMED
Benzodiazepines: NOT DETECTED
Cocaine: NOT DETECTED
Opiates: NOT DETECTED

## 2011-07-14 LAB — CBC
HCT: 36.7
HCT: 41.2
Hemoglobin: 12.2
Hemoglobin: 13.8
MCHC: 33.2
MCHC: 33.5
MCV: 90.1
MCV: 90.9
Platelets: 207
RBC: 4.13
RBC: 4.57
RDW: 13.9
WBC: 5.4

## 2011-07-14 LAB — DIFFERENTIAL
Basophils Absolute: 0
Basophils Absolute: 0
Eosinophils Absolute: 0.1
Eosinophils Relative: 1
Lymphocytes Relative: 25
Lymphocytes Relative: 29
Lymphs Abs: 1.4
Lymphs Abs: 2.2
Monocytes Absolute: 0.5
Neutrophils Relative %: 69

## 2011-07-14 LAB — MAGNESIUM
Magnesium: 1.7
Magnesium: 1.8

## 2011-07-14 LAB — POCT CARDIAC MARKERS
CKMB, poc: 1.2
CKMB, poc: 1.6
Myoglobin, poc: 59.9
Myoglobin, poc: 83

## 2011-07-14 LAB — CARDIAC PANEL(CRET KIN+CKTOT+MB+TROPI)
Relative Index: 2.6 — ABNORMAL HIGH
Total CK: 170
Troponin I: 0.02
Troponin I: 0.06

## 2011-07-14 LAB — CULTURE, BLOOD (ROUTINE X 2)
Culture: NO GROWTH
Culture: NO GROWTH

## 2011-07-14 LAB — BASIC METABOLIC PANEL
BUN: 8
Calcium: 8.4
Calcium: 8.9
Creatinine, Ser: 0.89
Creatinine, Ser: 1.05
GFR calc Af Amer: >60
GFR calc non Af Amer: 52 — ABNORMAL LOW
GFR calc non Af Amer: >60
Glucose, Bld: 270 — ABNORMAL HIGH
Potassium: 3.3 — ABNORMAL LOW

## 2011-07-14 LAB — COMPREHENSIVE METABOLIC PANEL
BUN: 8
CO2: 27
Calcium: 9.3
Creatinine, Ser: 0.94
GFR calc Af Amer: >60
GFR calc non Af Amer: 59 — ABNORMAL LOW
Glucose, Bld: 293 — ABNORMAL HIGH

## 2011-07-14 LAB — GLUCOSE, CAPILLARY
Glucose-Capillary: 168 — ABNORMAL HIGH
Glucose-Capillary: 246 — ABNORMAL HIGH
Glucose-Capillary: 256 — ABNORMAL HIGH
Glucose-Capillary: 270 — ABNORMAL HIGH
Glucose-Capillary: 134 — ABNORMAL HIGH

## 2011-07-14 LAB — HEMOGLOBIN A1C
Hgb A1c MFr Bld: 10.6 — ABNORMAL HIGH
Mean Plasma Glucose: 258

## 2011-07-14 LAB — URINALYSIS, ROUTINE W REFLEX MICROSCOPIC
Bilirubin Urine: NEGATIVE
Glucose, UA: >1000 — AB
Hgb urine dipstick: NEGATIVE
Specific Gravity, Urine: 1.021
Urobilinogen, UA: 1

## 2011-07-14 LAB — LIPID PANEL
HDL: 66
Total CHOL/HDL Ratio: 2.7

## 2011-07-14 LAB — TSH: TSH: 2.128

## 2011-07-14 LAB — URINE MICROSCOPIC-ADD ON

## 2011-07-14 LAB — PROTIME-INR
INR: 1
Prothrombin Time: 13.3

## 2011-07-14 LAB — TROPONIN I: Troponin I: 0.03

## 2011-07-14 LAB — LIPASE, BLOOD: Lipase: 18

## 2011-07-14 LAB — PHOSPHORUS: Phosphorus: 3.3

## 2011-07-14 LAB — CK TOTAL AND CKMB (NOT AT ARMC): Relative Index: 2.5

## 2011-07-18 LAB — I-STAT 8, (EC8 V) (CONVERTED LAB)
BUN: 10
Bicarbonate: 31 — ABNORMAL HIGH
Chloride: 106
HCT: 39
Hemoglobin: 13.3
Operator id: 196461
Sodium: 140

## 2011-07-18 LAB — POCT I-STAT CREATININE: Creatinine, Ser: 1.1

## 2011-07-28 ENCOUNTER — Ambulatory Visit (INDEPENDENT_AMBULATORY_CARE_PROVIDER_SITE_OTHER): Payer: Medicare Other | Admitting: Pharmacist

## 2011-07-28 DIAGNOSIS — Z7901 Long term (current) use of anticoagulants: Secondary | ICD-10-CM

## 2011-07-28 DIAGNOSIS — I6340 Cerebral infarction due to embolism of unspecified cerebral artery: Secondary | ICD-10-CM

## 2011-07-28 NOTE — Patient Instructions (Signed)
Patient instructed to take medications as defined in the Anti-coagulation Track section of this encounter.  Patient instructed to OMIT today's dose.  Patient verbalized understanding of these instructions.    

## 2011-07-28 NOTE — Progress Notes (Signed)
Anti-Coagulation Progress Note  JENNYFER NICKOLSON is a 77 y.o. female who is currently on an anti-coagulation regimen.    RECENT RESULTS: Recent results are below, the most recent result is correlated with a dose of 45 mg. per week: Lab Results  Component Value Date   INR 3.4 07/28/2011   INR 3.0 07/03/2011   INR 2.10 06/05/2011    ANTI-COAG DOSE:   Latest dosing instructions   Total Sun Mon Tue Wed Thu Fri Sat   40 5 mg 7.5 mg 5 mg 5 mg 7.5 mg 5 mg 5 mg    (5 mg1) (5 mg1.5) (5 mg1) (5 mg1) (5 mg1.5) (5 mg1) (5 mg1)         ANTICOAG SUMMARY: Anticoagulation Episode Summary              Current INR goal 2.0-3.0 Next INR check 08/18/2011   INR from last check 3.4! (07/28/2011)     Weekly max dose (mg)  Target end date Indefinite   Indications CEREBRAL EMBOLISM, WITH INFARCTION, Long term current use of anticoagulant   INR check location [facility] Preferred lab    Send INR reminders to [facility]   Comments        Provider Role Specialty Phone number   Blanch Media  Internal Medicine [phone]        ANTICOAG TODAY: Anticoagulation Summary as of 07/28/2011              INR goal 2.0-3.0     Selected INR 3.4! (07/28/2011) Next INR check 08/18/2011   Weekly max dose (mg)  Target end date Indefinite   Indications CEREBRAL EMBOLISM, WITH INFARCTION, Long term current use of anticoagulant    Anticoagulation Episode Summary              INR check location [facility] Preferred lab    Send INR reminders to [facility]   Comments        Provider Role Specialty Phone number   Blanch Media  Internal Medicine [phone]        PATIENT INSTRUCTIONS: Patient Instructions  Patient instructed to take medications as defined in the Anti-coagulation Track section of this encounter.  Patient instructed to OMIT today's dose.  Patient verbalized understanding of these instructions.        FOLLOW-UP Return in 3 weeks (on 08/18/2011) for Follow up  INR.  Hulen Luster, III Pharm.D., CACP

## 2011-08-01 ENCOUNTER — Other Ambulatory Visit: Payer: Self-pay | Admitting: *Deleted

## 2011-08-01 MED ORDER — WARFARIN SODIUM 5 MG PO TABS: ORAL_TABLET | ORAL | Status: DC

## 2011-08-04 ENCOUNTER — Other Ambulatory Visit: Payer: Self-pay | Admitting: Pharmacist

## 2011-08-08 ENCOUNTER — Ambulatory Visit (INDEPENDENT_AMBULATORY_CARE_PROVIDER_SITE_OTHER): Payer: Medicare Other | Admitting: *Deleted

## 2011-08-08 DIAGNOSIS — Z23 Encounter for immunization: Secondary | ICD-10-CM

## 2011-08-18 ENCOUNTER — Ambulatory Visit (INDEPENDENT_AMBULATORY_CARE_PROVIDER_SITE_OTHER): Payer: Medicare Other | Admitting: Pharmacist

## 2011-08-18 DIAGNOSIS — I6340 Cerebral infarction due to embolism of unspecified cerebral artery: Secondary | ICD-10-CM

## 2011-08-18 DIAGNOSIS — Z7901 Long term (current) use of anticoagulants: Secondary | ICD-10-CM

## 2011-08-18 NOTE — Progress Notes (Signed)
Anti-Coagulation Progress Note  [name] is a 77 y.o. female who is currently on an anti-coagulation regimen.    RECENT RESULTS: Recent results are below, the most recent result is correlated with a dose of 40 mg. per week: Lab Results  Component Value Date   INR 3.40 08/18/2011   INR 3.4 07/28/2011   INR 3.0 07/03/2011    ANTI-COAG DOSE:   Latest dosing instructions   Total Sun Mon Tue Wed Thu Fri Sat   40 5 mg 7.5 mg 5 mg 5 mg 7.5 mg 5 mg 5 mg    (5 mg1) (5 mg1.5) (5 mg1) (5 mg1) (5 mg1.5) (5 mg1) (5 mg1)         ANTICOAG SUMMARY: Anticoagulation Episode Summary              Current INR goal 2.0-3.0 Next INR check 09/08/2011   INR from last check 3.40! (08/18/2011)     Weekly max dose (mg)  Target end date Indefinite   Indications CEREBRAL EMBOLISM, WITH INFARCTION, Long term current use of anticoagulant   INR check location [facility] Preferred lab    Send INR reminders to [facility]   Comments        Provider Role Specialty Phone number   Blanch Media  Internal Medicine [phone]        ANTICOAG TODAY: Anticoagulation Summary as of 08/18/2011              INR goal 2.0-3.0     Selected INR 3.40! (08/18/2011) Next INR check 09/08/2011   Weekly max dose (mg)  Target end date Indefinite   Indications CEREBRAL EMBOLISM, WITH INFARCTION, Long term current use of anticoagulant    Anticoagulation Episode Summary              INR check location [facility] Preferred lab    Send INR reminders to [facility]   Comments        Provider Role Specialty Phone number   Blanch Media  Internal Medicine [phone]        PATIENT INSTRUCTIONS: Patient Instructions  Patient instructed to take medications as defined in the Anti-coagulation Track section of this encounter.  Patient instructed to take today's dose.  Patient verbalized understanding of these instructions.      FOLLOW-UP No Follow-up on file.  Hulen Luster,  III Pharm.D., CACP

## 2011-08-18 NOTE — Patient Instructions (Signed)
Patient instructed to take medications as defined in the Anti-coagulation Track section of this encounter.  Patient instructed to take today's dose.  Patient verbalized understanding of these instructions.    

## 2011-08-23 ENCOUNTER — Inpatient Hospital Stay (HOSPITAL_COMMUNITY)
Admission: EM | Admit: 2011-08-23 | Discharge: 2011-08-30 | DRG: 378 | Disposition: A | Discharge status: Home / Self Care | Payer: Medicare Other | Attending: Internal Medicine | Admitting: Internal Medicine

## 2011-08-23 ENCOUNTER — Other Ambulatory Visit: Payer: Self-pay

## 2011-08-23 ENCOUNTER — Emergency Department (HOSPITAL_COMMUNITY)
Admission: EM | Admit: 2011-08-23 | Discharge: 2011-08-23 | Disposition: A | Discharge status: Home / Self Care | Payer: Medicare Other | Attending: Emergency Medicine | Admitting: Emergency Medicine

## 2011-08-23 DIAGNOSIS — D62 Acute posthemorrhagic anemia: Secondary | ICD-10-CM | POA: Diagnosis present

## 2011-08-23 DIAGNOSIS — Z7982 Long term (current) use of aspirin: Secondary | ICD-10-CM

## 2011-08-23 DIAGNOSIS — I5032 Chronic diastolic (congestive) heart failure: Secondary | ICD-10-CM | POA: Diagnosis present

## 2011-08-23 DIAGNOSIS — I2510 Atherosclerotic heart disease of native coronary artery without angina pectoris: Secondary | ICD-10-CM | POA: Insufficient documentation

## 2011-08-23 DIAGNOSIS — D500 Iron deficiency anemia secondary to blood loss (chronic): Secondary | ICD-10-CM | POA: Diagnosis present

## 2011-08-23 DIAGNOSIS — I129 Hypertensive chronic kidney disease with stage 1 through stage 4 chronic kidney disease, or unspecified chronic kidney disease: Secondary | ICD-10-CM | POA: Diagnosis present

## 2011-08-23 DIAGNOSIS — K2971 Gastritis, unspecified, with bleeding: Principal | ICD-10-CM | POA: Diagnosis present

## 2011-08-23 DIAGNOSIS — I252 Old myocardial infarction: Secondary | ICD-10-CM

## 2011-08-23 DIAGNOSIS — I6340 Cerebral infarction due to embolism of unspecified cerebral artery: Secondary | ICD-10-CM | POA: Diagnosis present

## 2011-08-23 DIAGNOSIS — Z8673 Personal history of transient ischemic attack (TIA), and cerebral infarction without residual deficits: Secondary | ICD-10-CM

## 2011-08-23 DIAGNOSIS — E871 Hypo-osmolality and hyponatremia: Secondary | ICD-10-CM | POA: Diagnosis present

## 2011-08-23 DIAGNOSIS — Z794 Long term (current) use of insulin: Secondary | ICD-10-CM

## 2011-08-23 DIAGNOSIS — K2970 Gastritis, unspecified, without bleeding: Secondary | ICD-10-CM | POA: Diagnosis present

## 2011-08-23 DIAGNOSIS — K219 Gastro-esophageal reflux disease without esophagitis: Secondary | ICD-10-CM | POA: Insufficient documentation

## 2011-08-23 DIAGNOSIS — R51 Headache: Secondary | ICD-10-CM

## 2011-08-23 DIAGNOSIS — R42 Dizziness and giddiness: Secondary | ICD-10-CM | POA: Insufficient documentation

## 2011-08-23 DIAGNOSIS — E119 Type 2 diabetes mellitus without complications: Secondary | ICD-10-CM

## 2011-08-23 DIAGNOSIS — R739 Hyperglycemia, unspecified: Secondary | ICD-10-CM

## 2011-08-23 DIAGNOSIS — N189 Chronic kidney disease, unspecified: Secondary | ICD-10-CM | POA: Diagnosis present

## 2011-08-23 DIAGNOSIS — J019 Acute sinusitis, unspecified: Secondary | ICD-10-CM

## 2011-08-23 DIAGNOSIS — D649 Anemia, unspecified: Secondary | ICD-10-CM

## 2011-08-23 DIAGNOSIS — Z79899 Other long term (current) drug therapy: Secondary | ICD-10-CM

## 2011-08-23 DIAGNOSIS — M810 Age-related osteoporosis without current pathological fracture: Secondary | ICD-10-CM | POA: Insufficient documentation

## 2011-08-23 DIAGNOSIS — Z7901 Long term (current) use of anticoagulants: Secondary | ICD-10-CM

## 2011-08-23 DIAGNOSIS — I509 Heart failure, unspecified: Secondary | ICD-10-CM | POA: Diagnosis present

## 2011-08-23 DIAGNOSIS — K922 Gastrointestinal hemorrhage, unspecified: Secondary | ICD-10-CM

## 2011-08-23 DIAGNOSIS — N179 Acute kidney failure, unspecified: Secondary | ICD-10-CM | POA: Diagnosis present

## 2011-08-23 DIAGNOSIS — I10 Essential (primary) hypertension: Secondary | ICD-10-CM | POA: Diagnosis not present

## 2011-08-23 DIAGNOSIS — E785 Hyperlipidemia, unspecified: Secondary | ICD-10-CM | POA: Insufficient documentation

## 2011-08-23 DIAGNOSIS — G609 Hereditary and idiopathic neuropathy, unspecified: Secondary | ICD-10-CM | POA: Diagnosis present

## 2011-08-23 DIAGNOSIS — R5381 Other malaise: Secondary | ICD-10-CM | POA: Insufficient documentation

## 2011-08-23 DIAGNOSIS — R300 Dysuria: Secondary | ICD-10-CM

## 2011-08-23 DIAGNOSIS — E1165 Type 2 diabetes mellitus with hyperglycemia: Secondary | ICD-10-CM | POA: Diagnosis present

## 2011-08-23 DIAGNOSIS — IMO0002 Reserved for concepts with insufficient information to code with codable children: Secondary | ICD-10-CM | POA: Diagnosis present

## 2011-08-23 DIAGNOSIS — R791 Abnormal coagulation profile: Secondary | ICD-10-CM | POA: Diagnosis present

## 2011-08-23 DIAGNOSIS — R7309 Other abnormal glucose: Secondary | ICD-10-CM | POA: Insufficient documentation

## 2011-08-23 DIAGNOSIS — G4733 Obstructive sleep apnea (adult) (pediatric): Secondary | ICD-10-CM | POA: Diagnosis present

## 2011-08-23 DIAGNOSIS — I5022 Chronic systolic (congestive) heart failure: Secondary | ICD-10-CM

## 2011-08-23 HISTORY — DX: Hyperlipidemia, unspecified: E78.5

## 2011-08-23 HISTORY — DX: Type 2 diabetes mellitus with other diabetic neurological complication: E11.49

## 2011-08-23 HISTORY — DX: Obstructive sleep apnea (adult) (pediatric): G47.33

## 2011-08-23 HISTORY — DX: Type 2 diabetes mellitus without complications: E11.9

## 2011-08-23 HISTORY — DX: Cerebral infarction, unspecified: I63.9

## 2011-08-23 HISTORY — DX: Gastro-esophageal reflux disease without esophagitis: K21.9

## 2011-08-23 HISTORY — DX: Age-related osteoporosis without current pathological fracture: M81.0

## 2011-08-23 HISTORY — DX: Low back pain: M54.5

## 2011-08-23 HISTORY — DX: Essential (primary) hypertension: I10

## 2011-08-23 HISTORY — DX: Hereditary and idiopathic neuropathy, unspecified: G60.9

## 2011-08-23 HISTORY — DX: Cerebral infarction due to embolism of unspecified cerebral artery: I63.40

## 2011-08-23 LAB — DIFFERENTIAL
Basophils Relative: 0 % (ref 0–1)
Eosinophils Absolute: 0 10*3/uL (ref 0.0–0.7)
Eosinophils Relative: 0 % (ref 0–5)
Lymphs Abs: 1.1 10*3/uL (ref 0.7–4.0)
Monocytes Absolute: 0.3 10*3/uL (ref 0.1–1.0)
Monocytes Relative: 4 % (ref 3–12)
Neutrophils Relative %: 81 % — ABNORMAL HIGH (ref 43–77)

## 2011-08-23 LAB — GLUCOSE, CAPILLARY

## 2011-08-23 LAB — OCCULT BLOOD, POC DEVICE: Fecal Occult Bld: POSITIVE

## 2011-08-23 LAB — CBC
HCT: 15 % — ABNORMAL LOW (ref 36.0–46.0)
Hemoglobin: 4.7 g/dL — CL (ref 12.0–15.0)
MCH: 26 pg (ref 26.0–34.0)
MCHC: 31.3 g/dL (ref 30.0–36.0)
MCV: 82.9 fL (ref 78.0–100.0)

## 2011-08-23 MED ORDER — SODIUM CHLORIDE 0.9 % IV BOLUS (SEPSIS)
1000.0000 mL | Freq: Once | INTRAVENOUS | Status: AC
  Administered 2011-08-23: 1000 mL via INTRAVENOUS

## 2011-08-23 NOTE — ED Notes (Signed)
Pt states that CBG has been reading high all day long. Per EMS, CBG was "critical high".  EMS unable to obtain IV access.

## 2011-08-24 ENCOUNTER — Encounter (HOSPITAL_COMMUNITY): Payer: Self-pay | Admitting: *Deleted

## 2011-08-24 ENCOUNTER — Encounter: Payer: Self-pay | Admitting: Internal Medicine

## 2011-08-24 ENCOUNTER — Inpatient Hospital Stay (HOSPITAL_COMMUNITY): Payer: Medicare Other

## 2011-08-24 ENCOUNTER — Emergency Department (HOSPITAL_COMMUNITY): Payer: Medicare Other

## 2011-08-24 DIAGNOSIS — K2970 Gastritis, unspecified, without bleeding: Secondary | ICD-10-CM | POA: Diagnosis present

## 2011-08-24 DIAGNOSIS — I509 Heart failure, unspecified: Secondary | ICD-10-CM

## 2011-08-24 DIAGNOSIS — R791 Abnormal coagulation profile: Secondary | ICD-10-CM | POA: Diagnosis present

## 2011-08-24 DIAGNOSIS — D684 Acquired coagulation factor deficiency: Secondary | ICD-10-CM

## 2011-08-24 DIAGNOSIS — N179 Acute kidney failure, unspecified: Secondary | ICD-10-CM | POA: Diagnosis present

## 2011-08-24 DIAGNOSIS — K922 Gastrointestinal hemorrhage, unspecified: Secondary | ICD-10-CM

## 2011-08-24 DIAGNOSIS — R7309 Other abnormal glucose: Secondary | ICD-10-CM

## 2011-08-24 LAB — GLUCOSE, CAPILLARY
Glucose-Capillary: 101 mg/dL — ABNORMAL HIGH (ref 70–99)
Glucose-Capillary: 155 mg/dL — ABNORMAL HIGH (ref 70–99)
Glucose-Capillary: 94 mg/dL (ref 70–99)
Glucose-Capillary: 95 mg/dL (ref 70–99)

## 2011-08-24 LAB — COMPREHENSIVE METABOLIC PANEL
Albumin: 2.7 g/dL — ABNORMAL LOW (ref 3.5–5.2)
BUN: 103 mg/dL — ABNORMAL HIGH (ref 6–23)
Calcium: 9.4 mg/dL (ref 8.4–10.5)
Creatinine, Ser: 1.71 mg/dL — ABNORMAL HIGH (ref 0.50–1.10)
GFR calc Af Amer: 32 mL/min — ABNORMAL LOW (ref >90)
Glucose, Bld: 894 mg/dL (ref 70–99)
Total Protein: 5.7 g/dL — ABNORMAL LOW (ref 6.0–8.3)

## 2011-08-24 LAB — BASIC METABOLIC PANEL
CO2: 18 mEq/L — ABNORMAL LOW (ref 19–32)
Calcium: 9.5 mg/dL (ref 8.4–10.5)
Chloride: 103 mEq/L (ref 96–112)
GFR calc Af Amer: 35 mL/min — ABNORMAL LOW (ref >90)
GFR calc non Af Amer: 26 mL/min — ABNORMAL LOW (ref >90)
GFR calc non Af Amer: 30 mL/min — ABNORMAL LOW (ref >90)
Glucose, Bld: 694 mg/dL (ref 70–99)
Potassium: 4.8 mEq/L (ref 3.5–5.1)
Sodium: 134 mEq/L — ABNORMAL LOW (ref 135–145)
Sodium: 143 mEq/L (ref 135–145)

## 2011-08-24 LAB — PREPARE RBC (CROSSMATCH)

## 2011-08-24 LAB — CBC
HCT: 16 % — ABNORMAL LOW (ref 36.0–46.0)
HCT: 14.8 % — ABNORMAL LOW (ref 36.0–46.0)
Hemoglobin: 5.1 g/dL — CL (ref 12.0–15.0)
MCH: 26.4 pg (ref 26.0–34.0)
MCH: 26.8 pg (ref 26.0–34.0)
MCHC: 33.1 g/dL (ref 30.0–36.0)
MCHC: 34.7 g/dL (ref 30.0–36.0)
MCV: 82.9 fL (ref 78.0–100.0)
MCV: 80.9 fL (ref 78.0–100.0)
Platelets: 155 10*3/uL (ref 150–400)
RBC: 1.93 MIL/uL — ABNORMAL LOW (ref 3.87–5.11)
RDW: 14.4 % (ref 11.5–15.5)
RDW: 15.6 % — ABNORMAL HIGH (ref 11.5–15.5)
WBC: 9.1 10*3/uL (ref 4.0–10.5)

## 2011-08-24 LAB — POCT I-STAT TROPONIN I

## 2011-08-24 LAB — TYPE AND SCREEN
ABO/RH(D): B POS
Unit division: 0

## 2011-08-24 LAB — PROTIME-INR
INR: 2.22 — ABNORMAL HIGH (ref 0.00–1.49)
INR: 1.31 (ref 0.00–1.49)
Prothrombin Time: 34.2 seconds — ABNORMAL HIGH (ref 11.6–15.2)
Prothrombin Time: 16.5 seconds — ABNORMAL HIGH (ref 11.6–15.2)

## 2011-08-24 LAB — MRSA PCR SCREENING: MRSA by PCR: NEGATIVE

## 2011-08-24 LAB — ABO/RH: ABO/RH(D): B POS

## 2011-08-24 MED ORDER — INSULIN ASPART 100 UNIT/ML ~~LOC~~ SOLN
12.0000 [IU] | Freq: Once | SUBCUTANEOUS | Status: AC
  Administered 2011-08-24: 12 [IU] via SUBCUTANEOUS

## 2011-08-24 MED ORDER — VITAMIN K1 10 MG/ML IJ SOLN
1.0000 mg | INTRAVENOUS | Status: AC
  Administered 2011-08-24: 1 mg via INTRAVENOUS
  Filled 2011-08-24 (×2): qty 0.1

## 2011-08-24 MED ORDER — ZOLPIDEM TARTRATE 5 MG PO TABS: 5.0000 mg | ORAL_TABLET | Freq: Every evening | ORAL | Status: DC | PRN

## 2011-08-24 MED ORDER — SODIUM CHLORIDE 0.9 % IV SOLN
INTRAVENOUS | Status: DC
  Administered 2011-08-24: 09:00:00 via INTRAVENOUS

## 2011-08-24 MED ORDER — PANTOPRAZOLE SODIUM 40 MG IV SOLR
40.0000 mg | Freq: Two times a day (BID) | INTRAVENOUS | Status: DC
  Administered 2011-08-24: 40 mg via INTRAVENOUS
  Filled 2011-08-24 (×2): qty 40

## 2011-08-24 MED ORDER — SODIUM CHLORIDE 0.9 % IV SOLN
INTRAVENOUS | Status: DC
  Administered 2011-08-24: 3.2 [IU]/h via INTRAVENOUS
  Administered 2011-08-25: 2.5 [IU]/h via INTRAVENOUS
  Filled 2011-08-24: qty 1

## 2011-08-24 MED ORDER — SALMETEROL XINAFOATE 50 MCG/DOSE IN AEPB
1.0000 | INHALATION_SPRAY | Freq: Every day | RESPIRATORY_TRACT | Status: DC
  Filled 2011-08-24: qty 0

## 2011-08-24 MED ORDER — ROSUVASTATIN CALCIUM 20 MG PO TABS
20.0000 mg | ORAL_TABLET | Freq: Every day | ORAL | Status: DC
  Filled 2011-08-24: qty 1

## 2011-08-24 MED ORDER — CHLORHEXIDINE GLUCONATE 0.12 % MT SOLN: 15.0000 mL | Freq: Two times a day (BID) | OROMUCOSAL | Status: DC

## 2011-08-24 MED ORDER — INSULIN GLARGINE 100 UNIT/ML ~~LOC~~ SOLN
26.0000 [IU] | Freq: Every day | SUBCUTANEOUS | Status: DC
  Filled 2011-08-24: qty 3

## 2011-08-24 MED ORDER — PANTOPRAZOLE SODIUM 40 MG IV SOLR
40.0000 mg | Freq: Two times a day (BID) | INTRAVENOUS | Status: DC
  Administered 2011-08-24 – 2011-08-25 (×3): 40 mg via INTRAVENOUS
  Filled 2011-08-24 (×4): qty 40

## 2011-08-24 MED ORDER — ALBUTEROL SULFATE HFA 108 (90 BASE) MCG/ACT IN AERS
2.0000 | INHALATION_SPRAY | RESPIRATORY_TRACT | Status: DC | PRN
  Filled 2011-08-24: qty 6.7

## 2011-08-24 MED ORDER — INSULIN ASPART 100 UNIT/ML ~~LOC~~ SOLN
0.0000 [IU] | SUBCUTANEOUS | Status: DC
Start: 2011-08-24 — End: 2011-08-24
  Filled 2011-08-24: qty 3

## 2011-08-24 MED ORDER — INSULIN ASPART PROT & ASPART (70-30 MIX) 100 UNIT/ML ~~LOC~~ SUSP
10.0000 [IU] | Freq: Once | SUBCUTANEOUS | Status: DC
  Filled 2011-08-24: qty 3

## 2011-08-24 MED ORDER — ANTIINHIBITOR COAGULANT CMPLX IV SOLR
453.0000 [IU] | INTRAVENOUS | Status: DC
  Filled 2011-08-24: qty 453

## 2011-08-24 MED ORDER — BIOTENE DRY MOUTH MT LIQD
15.0000 mL | Freq: Two times a day (BID) | OROMUCOSAL | Status: DC
  Administered 2011-08-24 – 2011-08-27 (×8): 15 mL via OROMUCOSAL

## 2011-08-24 MED ORDER — VITAMIN K1 10 MG/ML IJ SOLN
10.0000 mg | INTRAMUSCULAR | Status: DC
  Administered 2011-08-24: 10 mg via INTRAVENOUS
  Filled 2011-08-24: qty 1

## 2011-08-24 MED ORDER — NITROGLYCERIN 0.4 MG SL SUBL: 0.4000 mg | SUBLINGUAL_TABLET | SUBLINGUAL | Status: DC | PRN

## 2011-08-24 MED ORDER — ANTIINHIBITOR COAGULANT CMPLX IV SOLR
500.0000 [IU] | INTRAVENOUS | Status: DC
  Filled 2011-08-24: qty 500

## 2011-08-24 MED ORDER — ANTIINHIBITOR COAGULANT CMPLX IV SOLR
500.0000 [IU] | Freq: Once | INTRAVENOUS | Status: DC | PRN
  Filled 2011-08-24: qty 500

## 2011-08-24 MED ORDER — SODIUM CHLORIDE 0.9 % IV SOLN
INTRAVENOUS | Status: DC
  Administered 2011-08-24: 10 mL/h via INTRAVENOUS

## 2011-08-24 MED ORDER — SODIUM CHLORIDE 0.9 % IV SOLN
INTRAVENOUS | Status: DC
  Administered 2011-08-24: 08:00:00 via INTRAVENOUS

## 2011-08-24 NOTE — H&P (Signed)
Internal Medicine Attending Admission Note Date: 08/24/2011  Patient name: [name] Medical record number: [id number] Date of birth: 05-02-1933 Age: 77 y.o. Gender: female  I saw and evaluated the patient. I reviewed the resident's note and I agree with the resident's findings and plan as documented in the resident's note, with additional comments as noted below.  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   Physical Exam - key components related to admission:  Filed Vitals:   08/24/11 1000 08/24/11 1015 08/24/11 1030 08/24/11 1045  BP: 126/41 126/39 124/39 127/46  Pulse: 91 92 91 91  Temp:    98.4 F (36.9 C)  TempSrc:    Oral  Resp: 22 22 20 22   Height:      Weight:      SpO2: 100%   100%   General: Alert, no distress Lungs: Clear to auscultation bilaterally Heart: Regular; S1-S2, no S3, no S4; a 2/6 systolic ejection murmur is audible Abdomen: Bowel sounds positive, soft, nontender Extremities: No edema or cyanosis  Lab results:   Basic Metabolic Panel:  [facility] 08/24/11 0605  NA 134*  K 4.8  CL 103  CO2 18*  GLUCOSE 694*  BUN 93*  CREATININE 1.82*  CALCIUM 9.3  MG --  PHOS --     Basename 08/24/11 0800 08/24/11 0324  WBC 9.1 9.8  NEUTROABS -- --  HGB 4.9* 5.1*  HCT 14.8* 16.0*  MCV 80.9 82.9  PLT 182 173    CBG:  Basename 08/24/11 0932 08/24/11 0432  GLUCAP 535* >600*    Coagulation:  Basename 08/24/11 0605  INR 2.22*   Imaging results:   08/24/2011  *RADIOLOGY REPORT*  Clinical Data: Central line placement  PORTABLE CHEST - 1 VIEW  Comparison: 8/31/ 2012  Findings: Right IJ venous catheter with its tip in the right  atrium, 4 cm below the cavoatrial junction.  The lungs are essentially clear. No pleural effusion or pneumothorax.  Mild cardiomegaly.  IMPRESSION: Right IJ venous catheter with its tip in the right atrium, 4 cm below the cavoatrial junction.  No pneumothorax.      Other results: EKG: Normal sinus rhythm; LVH with repolarization abnormality; prolonged QT  Assessment & Plan by Problem:  #1. GI bleeding.  The source of this is not clear; her melena suggests an upper GI or right colonic source.  Plan is correct INR with vitamin K and FFP; transfuse and follow hemoglobin closely; empiric IV PPI; GI consult; monitor in ICU setting. #2. Severe anemia due to #1.  As above, transfuse and follow hemoglobin closely. #3. Diabetes mellitus, type II, poorly controlled.  Patient has a low bicarbonate with high normal anion gap, in the setting of severe hyperglycemia.  Would check an arterial blood gas; continue normal saline for volume replacement, with close attention to avoid volume overload given her history of congestive heart failure; she may need an IV insulin drip if we have difficulty controlling her sugars with subcutaneous insulin. #4. Acute on chronic renal insufficiency.  This may be prerenal, or related to some degree of ischemia in the setting of her severe anemia.  Plan is to follow BUN and creatinine, evaluate further if this does not correct with transfusion and volume replacement. #  5. History of cerebral embolism.  Patient is on chronic warfarin therapy; this will be corrected given her GI bleed.  Would review records to see if prolonged anticoagulation is indicated based on her prior event. #6. Transfer of care.  Based on discussion by resident Dr. Tonny Branch with critical care medicine, they will take over patient's care while she is in the intensive care unit.

## 2011-08-24 NOTE — Progress Notes (Signed)
CRITICAL VALUE ALERT Critical value received: 664 Glucose  Date of notification:  08/24/11  Time of notification: 0703  Critical value read back:yes  Nurse who received alert:  Marisue Humble  MD notified (1st page):  PCCM- Almond Lint  Time of first page: 0703  Responding MD: Marchelle Gearing  Time MD responded:  [phone]  Orders received, give 12 units novolog now, carried out

## 2011-08-24 NOTE — ED Notes (Signed)
Per Admitting MD Garb. Pt is to receive 1mg  Vit K. Not 10mg  Vit K. Pt is to not receive 70/30 Insulin. Pt is not to receive FEIBA product. Pharmacy aware Admitting MD to d/c orders in old chart.

## 2011-08-24 NOTE — Progress Notes (Signed)
Pt . Has  Rt  hand #24 G; RN attempt to insert peripheral  unsuccessful . Unable  To transfuse PRBC as ordered;  Dr. Bosie Clos notified. PCCM will place central line. Will cont. To monitor pt.

## 2011-08-24 NOTE — Progress Notes (Signed)
[name] was admitted by the overnight team for a GI bleed.  She was transferred to ICU and PCCM was consulted because of inability to gain IV access.  She was seen by Dr. Blinda Leatherwood and a central line was placed.  We will transfer primary service to PCCM and when she returns from the ICU, [facility] will resume her care.  We appreciate PCCMs assistance with the care of this complex patient.

## 2011-08-24 NOTE — H&P (Signed)
Hospital Admission Note Date: 08/24/2011  Patient name:  [name]  Medical record number:  [id number] Date of birth:  01-25-1933  Age: 77 y.o. Gender:  female PCP:    Nelda Bucks, MD  Medical Service:   Internal Medicine Teaching Service   Attending physician:  Dr. Margarito Liner First Contact:   Dr. Rudolpho Sevin Pager: [phone] Second Contact:   Dr. Bard Herbert  Pager: [phone] After Hours:    First Contact   Pager: [phone]      Second Contact  Pager: [phone]   Chief Complaint: weakness   History of Present Illness: Patient is a 77 y.o. female with a PMHx of poorly controlled diabetes mellitus type 2 with last hemoglobin 13.7 in June 2012, congestive heart failure with ejection fraction 25-35%, anemia baseline hemoglobin over past year of 10, chronic renal insufficiency with baseline creatinine of 1.3, history of cerebral embolism on indefinite warfarin treatment who presents with complaints of generalized weakness and dark stool x1day. She reports the last 2 days her blood sugars have been steadily increasing from 400-500 and most recently unreadable on her glucometer.  She also states that she has not been responding to increased Lantus insulin from 20-30 units.  She reports that yesterday she was attempting to walk to the bathroom using her walker and was overcome by physical weakness in her arms and legs.  She subsequently collapsed to the floor, soling herself with urine and feces.  She denies syncope or presyncope, dizziness, light-headness, chest pain, nausea or vomiting.  She reports some mild shortness of breath at the time which has resolved and that her urine and stool were darker than normal.  She denies hematuria, blood in stool, on toilet paper or in toilet water. Of note, her recent INR level was 3.4 on 08/18/2011 which was unchanged from prior level 07/28/2011 but increased from September and August levels of 3.0 and 2.10 respectively.   Current Outpatient  Prescriptions  Medication Sig Dispense Refill  . albuterol (PROVENTIL HFA) 108 (90 BASE) MCG/ACT inhaler Inhale 2 puffs into the lungs every 4 (four) hours as needed for wheezing or shortness of breath. For wheezing or shortness of breath.  18 g  11  . alendronate (FOSAMAX) 35 MG tablet Take 35 mg by mouth every 7 (seven) days. Take in the morning with a full glass of water, on an empty stomach, and do not take anything else by mouth or lie down for the next 30 min.       Marland Kitchen amLODipine (NORVASC) 10 MG tablet TAKE ONE (1) TABLET EACH DAY                                                  Generic for NORVASC  30 tablet  11  . aspirin 81 MG EC tablet Take 81 mg by mouth daily.        . Blood Glucose Monitoring Suppl (PRODIGY AUTOCODE BLOOD GLUCOSE) W/DEVICE KIT Use to test blood sugar 3 times a day or as instructed.       . calcium-vitamin D (OSCAL WITH D 500-200) 500-200 MG-UNIT per tablet Take 1 tablet by mouth 3 (three) times daily.  90 tablet  6  . carvedilol (COREG) 12.5 MG tablet Take 12.5 mg by mouth 2 (two) times daily with meals.        . cloNIDine (  CATAPRES) 0.1 MG/24HR Place 1 patch onto the skin once a week.        . flunisolide (NASALIDE) 0.025 % SOLN Two sprays per nostril two times a day      . fluticasone (FLONASE) 50 MCG/ACT nasal spray Place 1 spray into the nose daily.  16 g  2  . glucose blood (PRODIGY NO CODING BLOOD GLUC) test strip Use to check your blood sugar 3 times a day  100 each  12  . insulin glargine (LANTUS) 100 UNIT/ML injection Inject 26 Units into the skin at bedtime.  10 mL  3  . Insulin Syringe-Needle U-100 (INSULIN SYRINGE .5CC/31GX5/16") 31G X 5/16" 0.5 ML MISC Use to inject insulin 3 times daily as instructed.       . Insulin Syringes, Disposable, U-100 0.3 ML MISC Use to inject insulin  1 each  6  . isosorbide mononitrate (IMDUR) 60 MG 24 hr tablet Take 60 mg by mouth daily.        . Lancets MISC Use to test blood sugar 3 times a day or as instructed.       Marland Kitchen  lisinopril (PRINIVIL,ZESTRIL) 40 MG tablet Take 40 mg by mouth daily.        Marland Kitchen loratadine (CLARITIN) 10 MG tablet Take 10 mg by mouth daily.        . nitroGLYCERIN (NITROSTAT) 0.4 MG SL tablet Place 0.4 mg under the tongue every 5 (five) minutes as needed.        . ranitidine (ZANTAC) 75 MG tablet Take 75 mg by mouth daily as needed.        . rosuvastatin (CRESTOR) 20 MG tablet Take 1 tablet (20 mg total) by mouth daily.  30 tablet  6  . salmeterol (SEREVENT) 50 MCG/DOSE diskus inhaler Inhale 1 puff into the lungs daily.        Marland Kitchen senna (SENOKOT) 8.6 MG tablet Take 1-2 tablets by mouth daily.  60 tablet  6  . warfarin (COUMADIN) 5 MG tablet Take as directed by Dr Alexandria Lodge Currently 5 mg Sunday Tuesday Wednesday Friday and Saturday; 7.5 mg Monday and Thursday   62 tablet  3  . zolpidem (AMBIEN) 5 MG tablet TAKE ONE TABLET AT BEDTIME                                                  Generic for AMBIEN 5  30 tablet  3    Allergies  Allergen Reactions  . Baking Soda-Fluoride (Sodium Fluoride) Nausea And Vomiting      Past Medical History  Diagnosis Date  . CAD (coronary artery disease)     Pt reports MI in 2006 (no documentation).  Cardiolite in 05/2002 and 07/2006 did not reveal any reversible ischemia.  Pt follows with Dr. Clarene Duke at [facility].  . CHF (congestive heart failure)     EF 25-30% with dilated LV, mild LVH, severe hypokinesis, and mod-severe reduction in RV function  . Osteoporosis    Diabetes mellitus HgbA1c 13.7 (03/2011)   CRI Baseline creatinine 1.3   hyperlipidemia    Obstructive sleep apnea    hypertension    GERD    Peripheral neuropathy      Past Surgical History  Procedure Date  . Abdominal hysterectomy   . Foot surgery     Family History: No family history on  file.   History   Social History  . Marital Status: Widowed    Spouse Name: N/A    Number of Children: N/A  . Years of Education: N/A   Occupational History  . Not on file.   Social History Main  Topics  . Smoking status: Never Smoker   . Alcohol Use: No  . Drug Use: No    Social History Narrative   Lives with son Fransisca Connors    Review of Systems: As per HPI and PMH  Vital Signs: T: 98.1 P: 78 BP: 105/36 RR: 18 O2 sat: 100% r.a.   Physical Exam: General: Well-developed, well-nourished, elderly female, in no acute distress, cooperative throughout exam  Head: Normocephalic, large old scar on left cheek  Eyes: PERRLA, EOMI, anicteric.  Nose: Nares clear without discharge or hypertrophy of turbinates  Throat: Dry mucus membranes, Oropharynx nonerythematous, no exudate appreciated.   Neck: No deformities, masses, or tenderness noted.Supple, No carotid Bruits, no JVD.  Lungs:  Normal respiratory effort. Clear to auscultation anteriorly BL without crackles or wheezes from apices to bases.  Heart: RRR. S1 and S2 normal with III/VI SEM  Abdomen:  BS normoactive. Soft, Nondistended, non-tender.  No masses or organomegaly.appreciated  Extremities: No pretibial edema, distal pulses difficult to appreciated but extremities warm with good capillary refill, bilateral claw foot   Neurologic: A&O X3, CN II - XII are grossly intact. Motor strength is 4/5 in all extremities, Sensations grossly intact to light touch  Skin: Multiple well healed excoriations on bilateral shins   Lab results: Prothrombin Time 34.2 (H) 11.6 - 15.2 seconds  INR 3.32 (H) 0.00 - 1.49   WBC 7.8 4.0 - 10.5 K/uL RBC 1.81 (L) 3.87 - 5.11 MIL/uL  Hemoglobin 4.7 (LL) 12.0 - 15.0 g/dL  HCT 16.1 (L) 09.6 - 04.5 %  MCV 82.9 78.0 - 100.0 fL  MCH 26.0 26.0 - 34.0 pg  MCHC 31.3 30.0 - 36.0 g/dL  RDW 40.9 81.1 - 91.4 %  Platelets 166 150 - 400 K/uL  Neutrophils Relative 81 (H) 43 - 77 %  Neutro Abs 6.3 1.7 - 7.7 K/uL  Lymphocytes Relative 14 12 - 46 %  Lymphs Abs 1.1 0.7 - 4.0 K/uL  Monocytes Relative 4 3 - 12 %  Monocytes Absolute 0.3 0.1 - 1.0 K/uL  Eosinophils Relative 0 0 - 5 %  Eosinophils Absolute 0.0  0.0 - 0.7 K/uL  Basophils Relative 0 0 - 1 %  Basophils Absolute 0.0 0.0 -  Sodium 130 (L) 135 - 145 mEq/L  Potassium 4.6 3.5 - 5.1 mEq/L  Chloride 98 96 - 112 mEq/L  CO2 19 19 - 32 mEq/L  Glucose, Bld 894 (HH) 70 - 99 mg/dL   BUN 782 (H) 6 - 23 mg/dL  Creatinine, Ser 9.56 (H) 0.50 - 1.10 mg/dL  Calcium 9.4 8.4 - 21.3 mg/dL  Total Protein 5.7 (L) 6.0 - 8.3 g/dL  Albumin 2.7 (L) 3.5 - 5.2 g/dL  AST 13 0 - 37 U/L  ALT 12 0 - 35 U/L  Alkaline Phosphatase 50 39 - 117 U/L  Total Bilirubin 0.1 (L) 0.3 - 1.2 mg/dL  GFR calc non Af Amer 28 (L) >90 mL/min GFR calc Af Amer 32 (L) >90 mL/min  Troponin i, poc 0.02 0.00 - 0.08 ng/mL FOBT; positive  Other results: EKG with NSR, 86bpm, no ST changes, prolonged QT interval, no significant change since 06/2010    Assessment & Plan: 77 year old female with poorly controlled  diabetes, anemia, chronic renal insufficiency, history of cerebral embolism on chronic Warfarin therapy with supra-therapeutic INR level admitted with hemoglobin equal 4.7. 1. Anemia: likely etiology of patient's weakness and probable GI bleed given positive FOBT and chronic warfarin therapy, although currently hemodynamically stable   PLAN: -Hold Warfarin therapy -Consult GI to evaluate source and further management -Protonix 40 mg IV twice a day  -Type and cross and transfuse 2 units packed red blood cells for goal hemoglobin greater than or equal to 8 -Post transfusion CBC  -normal saline IV fluids 100 cc per hour -12-lead EKG in the morning -PCCM consult in the morning  2. Hyperglycemia: Patient has been chronically poorly controlled with the last hemoglobin A1c 13.7. Admission glucose 894, pt received home regimen dose of Lantus in ED  PLAN: -Sliding-scale insulin sensitive -CBG check every 4 hour - normal saline IV fluids 100 cc per hour  3. supra-therapeutic INR: She has been trending up for at least 2 months, when she was last therapeutic in office per rectum  and was Sunday, Monday, Wednesday, Friday 5 mg; Tuesday, Thursday, Saturday 7.5 mg for total of 42.5 per week. On admission today her total is 40 per week.  PLAN: -Will hold warfarin therapy -Vitamin K 1mg  IV -Consider fresh frozen plasma   4. Acute on chronic renal failure: Likely prerenal, pt baseline creatinine approximately 1.3, creatinine level 4 months ago 0.95 and today on admission creatinine 1.71 , elevated BUN could cause acute renal insufficiency (could be secondary to upper GI bleed)  PLAN: -We'll continue to monitor her creatinine level with BMET in the morning   5. Hyponatremia: Likely pseudohyponatremia given elevated glucose level of 894 and normal anion gap. Patient's corrected sodium level is 143.  PLAN: -Treat hyperglycemia as above -continue to monitor with BMET  6. Hypertension: Currently well controlled with BP 105/36  PLAN: -Will hold outpatient regimen of antihypertensive antihypertensive for now  7. CHF: pt currently without symptoms or complaints  PLAN:  -will continue to monitor  DVT PPX - patient is currently supra-therapeutic, will defer further anti-coagulation at this time     Kristie Cowman, M.D. (PGY1):  ____________________________________    Date/ Time:      ____________________________________     Lars Mage (PGY2/3):  ____________________________________    Date/ Time:      ____________________________________      I have seen and examined the patient. I reviewed the resident/fellow note and agree with the findings and plan of care as documented. My additions and revisions are included.   Signature:  ____________________________________________     Internal Medicine Teaching Service Attending    Date:    ____________________________________________

## 2011-08-24 NOTE — ED Provider Notes (Addendum)
History     CSN: [id number] Arrival date & time: 08/23/2011 10:21 PM   None     Chief complaint weakness  (Consider location/radiation/quality/duration/timing/severity/associated sxs/prior treatment) Patient is a 77 y.o. female presenting with weakness. The history is provided by the patient.  Weakness The primary symptoms include dizziness. Primary symptoms do not include headaches or fever. The symptoms began 2 days ago. The episode lasted 2 days. The symptoms are worsening. The neurological symptoms are diffuse. Context: Tonight to the point where she can't stand up.  Dizziness also occurs with weakness.  Additional symptoms include weakness. Additional symptoms do not include neck stiffness. Medical issues also include diabetes.   Symptoms to the point that they're severe. Family noticed her blood sugar was elevated to the point of undetectable and bring her in for evaluation. She denies any chest pain or shortness of breath. No syncope. No recent fever or illness. No aggravating or alleviating factors. Symptoms constant since onset and worsening. No pain.   Past Medical History  Diagnosis Date  . CAD (coronary artery disease)     Pt reports MI in 2006 (no documentation).  Cardiolite in 05/2002 and 07/2006 did not reveal any reversible ischemia.  Pt [facility] with Dr. [facility] at [facility].  . CHF (congestive heart failure)     EF 25-30% with dilated LV, mild LVH, severe hypokinesis, and mod-severe reduction in RV function  . Osteoporosis     Past Surgical History  Procedure Date  . Abdominal hysterectomy   . Foot surgery     No family history on file.  History  Substance Use Topics  . Smoking status: Never Smoker   . Smokeless tobacco: Not on file  . Alcohol Use: No    OB History    Grav Para Term Preterm Abortions TAB SAB Ect Mult Living                  Review of Systems  Constitutional: Negative for fever and chills.  HENT: Negative for neck pain and neck stiffness.    Eyes: Negative for pain.  Respiratory: Negative for shortness of breath.   Cardiovascular: Positive for palpitations. Negative for chest pain.  Gastrointestinal: Negative for abdominal pain.  Genitourinary: Negative for dysuria.  Musculoskeletal: Negative for back pain.  Skin: Negative for rash.  Neurological: Positive for dizziness and weakness. Negative for headaches.  All other systems reviewed and are negative.    Allergies  Baking soda-fluoride  Home Medications   Current Outpatient Rx  Name Route Sig Dispense Refill  . ALBUTEROL SULFATE HFA 108 (90 BASE) MCG/ACT IN AERS Inhalation Inhale 2 puffs into the lungs every 4 (four) hours as needed for wheezing or shortness of breath. For wheezing or shortness of breath. 18 g 11  . ALENDRONATE SODIUM 35 MG PO TABS Oral Take 35 mg by mouth every 7 (seven) days. Take in the morning with a full glass of water, on an empty stomach, and do not take anything else by mouth or lie down for the next 30 min.     Marland Kitchen AMLODIPINE BESYLATE 10 MG PO TABS  TAKE ONE (1) TABLET EACH DAY                                                  Generic for NORVASC 30 tablet 11  . ASPIRIN 81 MG PO  TBEC Oral Take 81 mg by mouth daily.      Marland Kitchen PRODIGY AUTOCODE BLOOD GLUCOSE W/DEVICE KIT  Use to test blood sugar 3 times a day or as instructed.     Marland Kitchen CALCIUM CARBONATE-VITAMIN D 500-200 MG-UNIT PO TABS Oral Take 1 tablet by mouth 3 (three) times daily. 90 tablet 6  . CARVEDILOL 12.5 MG PO TABS Oral Take 12.5 mg by mouth 2 (two) times daily with meals.      Marland Kitchen CLONIDINE HCL 0.1 MG/24HR TD PTWK Transdermal Place 1 patch onto the skin once a week.      Marland Kitchen FLUNISOLIDE 0.025 % NA SOLN  Two sprays per nostril two times a day    . FLUTICASONE PROPIONATE 50 MCG/ACT NA SUSP Nasal Place 1 spray into the nose daily. 16 g 2  . GLUCOSE BLOOD VI STRP  Use to check your blood sugar 3 times a day 100 each 12  . INSULIN GLARGINE 100 UNIT/ML [place] SOLN Subcutaneous Inject 26 Units into the skin  at bedtime. 10 mL 3  . INSULIN SYRINGE 31G X 5/16" 0.5 ML MISC  Use to inject insulin 3 times daily as instructed.     . INSULIN SYRINGES (DISPOSABLE) U-100 0.3 ML MISC  Use to inject insulin 1 each 6  . ISOSORBIDE MONONITRATE ER 60 MG PO TB24 Oral Take 60 mg by mouth daily.      Marland Kitchen LANCETS MISC  Use to test blood sugar 3 times a day or as instructed.     Marland Kitchen LISINOPRIL 40 MG PO TABS Oral Take 40 mg by mouth daily.      Marland Kitchen LORATADINE 10 MG PO TABS Oral Take 10 mg by mouth daily.      Marland Kitchen NITROGLYCERIN 0.4 MG SL SUBL Sublingual Place 0.4 mg under the tongue every 5 (five) minutes as needed.      Marland Kitchen RANITIDINE HCL 75 MG PO TABS Oral Take 75 mg by mouth daily as needed.      Marland Kitchen ROSUVASTATIN CALCIUM 20 MG PO TABS Oral Take 1 tablet (20 mg total) by mouth daily. 30 tablet 6  . SALMETEROL XINAFOATE 50 MCG/DOSE IN AEPB Inhalation Inhale 1 puff into the lungs daily.      . SENNOSIDES 8.6 MG PO TABS Oral Take 1-2 tablets by mouth daily. 60 tablet 6  . WARFARIN SODIUM 5 MG PO TABS  Take as directed by Dr Alexandria Lodge 62 tablet 3  . ZOLPIDEM TARTRATE 5 MG PO TABS  TAKE ONE TABLET AT BEDTIME                                                  Generic for AMBIEN 5 30 tablet 3    PLEASE RESPOND THANKS!    LMP 12/19/1968  Physical Exam  Constitutional: She is oriented to person, place, and time. She appears well-developed and well-nourished.  HENT:  Head: Normocephalic and atraumatic.  Eyes: Conjunctivae and EOM are normal. Pupils are equal, round, and reactive to light.  Neck: Trachea normal. Neck supple. No thyromegaly present.  Cardiovascular: Normal rate, regular rhythm, S1 normal, S2 normal and normal pulses.     No systolic murmur is present   No diastolic murmur is present  Pulses:      Radial pulses are 2+ on the right side, and 2+ on the left side.  Pulmonary/Chest:  Effort normal and breath sounds normal. She has no wheezes. She has no rhonchi. She has no rales. She exhibits no tenderness.  Abdominal: Soft.  Normal appearance and bowel sounds are normal. There is no tenderness. There is no CVA tenderness and negative Murphy's sign.  Genitourinary:       Dark stool nontender rectal exam  Musculoskeletal:       BLE:s Calves nontender, no cords or erythema, negative Homans sign  Neurological: She is alert and oriented to person, place, and time. She has normal strength. No cranial nerve deficit or sensory deficit. GCS eye subscore is 4. GCS verbal subscore is 5. GCS motor subscore is 6.  Skin: Skin is warm and dry. No rash noted. She is not diaphoretic.  Psychiatric: Her speech is normal.       Cooperative and appropriate    ED Course  Procedures (including critical care time)  Results for orders placed in visit on 08/18/11  POCT INR      Component Value Range   INR 3.40     Hemoglobin 4.7  Troponin 0.0  Glucose greater than 800  Chest x-ray: Mild cardiac enlargement and bibasilar atelectasis    No results found for this or any previous visit (from the past 24 hour(s)).  1. DIABETES MELLITUS, TYPE II   2. Headache   3. Acute sinusitis with symptoms > 10 days   4. Dysuria   5. CEREBRAL EMBOLISM, WITH INFARCTION   6. Hyperlipidemia   7. Constipation    Case discussed with outpatient clinics team who evaluated patient bedside in the ED.   Marland Kitchen CRITICAL CARE Performed by: Sunnie Nielsen   Total critical care time: 45  Critical care time was exclusive of separately billable procedures and treating other patients.  Critical care was necessary to treat or prevent imminent or life-threatening deterioration.  Critical care was time spent personally by me on the following activities: development of treatment plan with patient and/or surrogate as well as nursing, discussions with consultants, evaluation of patient's response to treatment, examination of patient, obtaining history from patient or surrogate, ordering and performing treatments and interventions, ordering and review of  laboratory studies, ordering and review of radiographic studies, pulse oximetry and re-evaluation of patient's condition.  Stat hemoglobin reported at 4.7 and blood products requested and 2 units of packed blood cells transfused. Reversal protocol initiated for symptomatic anemia with active GI bleeding. Medicine consult and GI consult requested. ICU admission. Borderline hypotension improving with IV fluids and serial evaluations. Family updated bedside.   Date: 08/24/2011  Rate: 86  Rhythm: normal sinus rhythm  QRS Axis: normal  Intervals: PR prolonged  ST/T Wave abnormalities: nonspecific ST changes  Conduction Disutrbances:first-degree A-V block   Narrative Interpretation: INF and LAt St depressions  Old EKG Reviewed: changes noted    MDM   GI bleeding on Coumadin requiring reversal and transfusion of red blood cells for severe and symptomatic anemia.  Labs obtained and reviewed IV insulin drip and glucose stabilizer.. IV fluids to maintain blood pressure. Serial evaluations for history of CHF. Medicine evaluation bedside for ICU admit        Sunnie Nielsen, MD 08/24/11 0454  Sunnie Nielsen, MD 08/24/11 [phone]

## 2011-08-24 NOTE — ED Notes (Signed)
Per daughter. Pt has been weak and also has a change in stool color dark since yesterday. Pt CBG have been reading high as well. Pt has been acting right for the past two days with high CBG. Pt currently easily falling asleep. Pt able to state name but not oriented to place. Pt weak with asking to move extremities.

## 2011-08-24 NOTE — ED Notes (Signed)
Pt placed on bedpan for urine sample. Pt did urinate but also had to have a bowel movement. No urine sample provided.

## 2011-08-24 NOTE — Consult Note (Signed)
Name: [name] MRN: [id number] DOB: 05-Feb-1933  DOS:   08/24/11     CRITICAL CARE MEDICINE ADMISSION / CONSULTATION NOTE  Referring Physician: Internal Medicine Teaching Service (Dr Eben Burow)  Reason For Consult : GI bleeding  History Of Present Illness  : The patient is a 77 year old lady with multiple medical problems who presented with generalized weakness and melena. She is currently on coumadin therapy for cerebral embolism. Her INR on admission was 3.4 and Hb<5. She is admitted in the ICU for hemodynamic monitoring.  Past Medical History  Diagnosis Date  . CAD (coronary artery disease)     Pt reports MI in 2006 (no documentation).  Cardiolite in 05/2002 and 07/2006 did not reveal any reversible ischemia.  Pt follows with Dr. Clarene Duke at [facility].  . CHF (congestive heart failure)     EF 25-30% with dilated LV, mild LVH, severe hypokinesis, and mod-severe reduction in RV function  . Osteoporosis     Past Surgical History  Procedure Date  . Abdominal hysterectomy   . Foot surgery     Prior to Admission medications   Medication Sig Start Date End Date Taking? Authorizing Provider  albuterol (PROVENTIL HFA) 108 (90 BASE) MCG/ACT inhaler Inhale 2 puffs into the lungs every 4 (four) hours as needed for wheezing or shortness of breath. For wheezing or shortness of breath. 04/10/11  Yes Nelda Bucks  alendronate (FOSAMAX) 35 MG tablet Take 35 mg by mouth every 7 (seven) days. Take in the morning with a full glass of water, on an empty stomach, and do not take anything else by mouth or lie down for the next 30 min.    Yes Historical Provider, MD  amLODipine (NORVASC) 10 MG tablet TAKE ONE (1) TABLET EACH DAY                                                  Generic for [facility] 06/18/11  Yes Nelda Bucks  aspirin 81 MG EC tablet Take 81 mg by mouth daily.     Yes Historical Provider, MD  Blood Glucose Monitoring Suppl (PRODIGY AUTOCODE BLOOD GLUCOSE) W/DEVICE KIT Use to test blood sugar 3  times a day or as instructed.    Yes Historical Provider, MD  calcium-vitamin D (OSCAL WITH D 500-200) 500-200 MG-UNIT per tablet Take 1 tablet by mouth 3 (three) times daily. 12/20/10  Yes Nelda Bucks  carvedilol (COREG) 12.5 MG tablet Take 12.5 mg by mouth 2 (two) times daily with meals.     Yes Historical Provider, MD  cloNIDine (CATAPRES) 0.1 MG/24HR Place 1 patch onto the skin once a week.     Yes Historical Provider, MD  flunisolide (NASALIDE) 0.025 % SOLN Two sprays per nostril two times a day   Yes Historical Provider, MD  fluticasone (FLONASE) 50 MCG/ACT nasal spray Place 1 spray into the nose daily. 03/28/11 03/27/12 Yes Kristin Mills  glucose blood (PRODIGY NO CODING BLOOD GLUC) test strip Use to check your blood sugar 3 times a day 04/10/11 04/09/12 Yes Nelda Bucks  insulin glargine (LANTUS) 100 UNIT/ML injection Inject 26 Units into the skin at bedtime. 04/10/11 04/09/14 Yes Nelda Bucks  Insulin Syringe-Needle U-100 (INSULIN SYRINGE .5CC/31GX5/16") 31G X 5/16" 0.5 ML MISC Use to inject insulin 3 times daily as instructed.    Yes Historical Provider, MD  Insulin Syringes,  Disposable, U-100 0.3 ML MISC Use to inject insulin 03/28/11  Yes Nelda Bucks  isosorbide mononitrate (IMDUR) 60 MG 24 hr tablet Take 60 mg by mouth daily.     Yes Historical Provider, MD  Lancets MISC Use to test blood sugar 3 times a day or as instructed.    Yes Historical Provider, MD  lisinopril (PRINIVIL,ZESTRIL) 40 MG tablet Take 40 mg by mouth daily.     Yes Historical Provider, MD  loratadine (CLARITIN) 10 MG tablet Take 10 mg by mouth daily.     Yes Historical Provider, MD  nitroGLYCERIN (NITROSTAT) 0.4 MG SL tablet Place 0.4 mg under the tongue every 5 (five) minutes as needed.     Yes Historical Provider, MD  ranitidine (ZANTAC) 75 MG tablet Take 75 mg by mouth daily as needed.     Yes Historical Provider, MD  rosuvastatin (CRESTOR) 20 MG tablet Take 1 tablet (20 mg total) by mouth daily. 12/20/10  Yes Kristin  Mills  salmeterol (SEREVENT) 50 MCG/DOSE diskus inhaler Inhale 1 puff into the lungs daily.     Yes Historical Provider, MD  senna (SENOKOT) 8.6 MG tablet Take 1-2 tablets by mouth daily. 12/20/10  Yes Nelda Bucks  warfarin (COUMADIN) 5 MG tablet Take as directed by Dr Alexandria Lodge 08/01/11  Yes Blanch Media  zolpidem (AMBIEN) 5 MG tablet TAKE ONE TABLET AT BEDTIME                                                  Generic for AMBIEN 5 07/04/11  Yes Nelda Bucks    Allergies  Allergen Reactions  . Baking Soda-Fluoride (Sodium Fluoride) Nausea And Vomiting    Family History  Problem Relation Age of Onset  . Diabetes insipidus Mother   . Hypertension Mother     Social History  reports that she has never smoked. She does not have any smokeless tobacco history on file. She reports that she does not drink alcohol or use illicit drugs.  Review Of Systems  11 points review of systems is negative with an exception of listed in HPI.  Physical Examination  BP 120/44  Pulse 97  Temp(Src) 98.8 F (37.1 C) (Oral)  Resp 18  Ht 5\' 5"  (1.651 m)  Wt 164 lb 3.9 oz (74.5 kg)  BMI 27.33 kg/m2  SpO2 100%  LMP 12/19/1968  Neuro:  Alert, ox3 HEENT: No JVD Heart:  RRR, no M/R/G Lungs:  Bilateral air entry, no W/R/R Abdomen:  Soft, non tender, bowel sounds present Extremities:  Bowel sounds present  Labs  Results for orders placed during the hospital encounter of 08/23/11 (from the past 24 hour(s))  TYPE AND SCREEN     Status: Normal (Preliminary result)   Collection Time   08/24/11  3:20 AM      Component Value Range   ABO/RH(D) B POS     Antibody Screen NEG     Sample Expiration 08/27/2011     Unit Number [id number]     Blood Component Type RED CELLS,LR     Unit division 00     Status of Unit ALLOCATED     Transfusion Status OK TO TRANSFUSE     Crossmatch Result Compatible     Unit Number [id number]     Blood Component Type RED CELLS,LR     Unit division 00  Status of Unit  ALLOCATED     Transfusion Status OK TO TRANSFUSE     Crossmatch Result Compatible    PREPARE RBC (CROSSMATCH)     Status: Normal   Collection Time   08/24/11  3:20 AM      Component Value Range   Order Confirmation ORDER PROCESSED BY BLOOD BANK    ABO/RH     Status: Normal (Preliminary result)   Collection Time   08/24/11  3:20 AM      Component Value Range   ABO/RH(D) B POS    CBC     Status: Abnormal   Collection Time   08/24/11  3:24 AM      Component Value Range   WBC 9.8  4.0 - 10.5 (K/uL)   RBC 1.93 (*) 3.87 - 5.11 (MIL/uL)   Hemoglobin 5.1 (*) 12.0 - 15.0 (g/dL)   HCT 56.2 (*) 13.0 - 46.0 (%)   MCV 82.9  78.0 - 100.0 (fL)   MCH 26.4  26.0 - 34.0 (pg)   MCHC 31.9  30.0 - 36.0 (g/dL)   RDW 86.5  78.4 - 69.6 (%)   Platelets 173  150 - 400 (K/uL)  GLUCOSE, CAPILLARY     Status: Abnormal   Collection Time   08/24/11  4:32 AM      Component Value Range   Glucose-Capillary >600 (*) 70 - 99 (mg/dL)   Comment 1 STAT Lab      Imaging  No results found.  Assessment Patient Active Hospital Problem List:  GI bleeding/Anemia (08/24/2011) -Monitor CBC q8hourly -PRBC transfusion. - Monitor hemodynamics. - Hold coumadin  DIABETES MELLITUS, TYPE II (11/04/1983)   - Cont. Home insulin regimen - Regular insulin with sliding scale  CONGESTIVE HEART FAILURE/CAD (08/03/2006)  - Cont home meds. - Would hold aspirin though.  CEREBRAL EMBOLISM, WITH INFARCTION (07/02/2010)   - Hold coumadin for now.  CRI (chronic renal insufficiency) (06/27/2010)  - Monitor electrolytes.  Supratherapeutic INR (08/24/2011)   - Monitor INR - Vitamin K, FFP and hold coumadin.   The patient is critically ill with multiple organ systems failure and requires high complexity decision making for assessment and support, frequent evaluation and titration of therapies, application of advanced monitoring technologies and extensive interpretation of multiple databases. Critical Care Time devoted to  patient care services described in this note is 30 minutes.  [name], MD 08/24/2011, 6:04 AM

## 2011-08-24 NOTE — Progress Notes (Signed)
CRITICAL CARE MEDICINE ADMISSION / CONSULTATION NOTE   Referring Physician: Internal Medicine Teaching Service (Dr Eben Burow)   Reason For Consult : GI bleeding  Brief history History Of Present Illness  : The patient is a 77 year old lady with multiple medical problems:  including coronary artery disease,congestive heart failure with EF of 25-30%, and recent history of cerebral embolism for which she was on Coumadin. Presented with generalized weakness and melena on 11/11. Her INR on admission was 3.4 and Hb<5. She is admitted in the ICU for hemodynamic monitoring. Critical care services were asked to assume her care.    Lines/tubes Right IJ CVL 11/11>>>  Culture data/sepsis markers  Best practice PPI 11-11 PAS 11-11  Protocols/consults [facility] 11/11  Events/studies  Subjective Still feels weak. Nursing reports no further bloody stool Objective Temp:  [98.1 F (36.7 C)-99.5 F (37.5 C)] 98.5 F (36.9 C) (11/11 1315) Pulse Rate:  [89-100] 89  (11/11 1330) Resp:  [13-27] 20  (11/11 1330) BP: (98-142)/(34-63) 119/39 mmHg (11/11 1330) SpO2:  [99 %-100 %] 100 % (11/11 1200) Weight:  [74.5 kg (164 lb 3.9 oz)] 164 lb 3.9 oz (74.5 kg) (11/11 0500)  Physical Examination Neuro:  Alert, ox3 HEENT: No JVD Heart:  RRR, no M/R/G Lungs:  Bilateral air entry, no W/R/R Abdomen:  Soft, non tender, bowel sounds present Extremities:  Bowel sounds present  Labs Lab Results  Component Value Date   WBC 9.1 08/24/2011   HGB 4.9* 08/24/2011   HCT 14.8* 08/24/2011   MCV 80.9 08/24/2011   PLT 182 08/24/2011  ]  Lab Results  Component Value Date   INR 1.63* 08/24/2011   INR 2.22* 08/24/2011   INR 3.40 08/18/2011   . Lab Results  Component Value Date   CREATININE 1.82* 08/24/2011   BUN 93* 08/24/2011   NA 134* 08/24/2011   K 4.8 08/24/2011   CL 103 08/24/2011   CO2 18* 08/24/2011    Imaging PCXR: Chest x-ray clear without edema or infiltrate. Right IJ in satisfactory  position.   Assessment/plan 1) GI bleeding/Anemia (08/24/2011). Appears to be upper GI in origin. No doubt complicated by supratherapeutic INR.Marland KitchenMarland KitchenThe hemoglobin of 4.9 represents pre-transfusion study. She is now finishing her second of 2 units of blood.  Lab 08/24/11 0800 08/24/11 0324  HGB 4.9* 5.1*  ] Plan: - initiate blood transfusion protocol - continue IV Protonix -close followup on hemoglobin and hematocrit, given her active bleeding, and cardiomyopathy,we will aim for a hemoglobin greater than 8 -Correct coagulopathy: Discontinue aspirin and Coumadin -GI consultation, have spoken with Dr. Arlyce Dice with GI. They will plan on formal consultation on 08/25/11.  2) CONGESTIVE HEART FAILURE/CAD (Known EF 25-30%). Currently blood pressure trending down. Her chest x-ray is clear, without evidence of pulmonary edema. Plan: -transfuse for known history of coronary artery disease -Will watch pulmonary status closely, may need intermittent diuretics.  3) CRI (chronic renal insufficiency), now with acute renal failure. Her baseline creatinine is 1.24 (from 4 months prior to admit). Recent Labs  [facility] 08/24/11 0605   CREATININE 1.82*  ] Plan: -Correct volume depletion -Hold nephrotoxic drugs -watch intake and output -Recheck chemistry now  4) coagulopathy, in the setting of Coumadin administration. Trending down nicely following vitamin K administration Lab Results  Component Value Date   INR 1.63* 08/24/2011   INR 2.22* 08/24/2011   INR 3.40 08/18/2011   Plan: -recheck INR this afternoon and again in the morning -DC'd Coumadin -Will give one dose of FFP  5 ) DIABETES MELLITUS, TYPE II , hyperglycemia CBG (last 3)   Basename 08/24/11 1138 08/24/11 0932 08/24/11 0432  GLUCAP 504* 535* >600*  plan: -initiate ICU protocol, it appears as though she'll need insulin infusion we will need an arterial blood gas, and new chemistry, given the severity of her hyperglycemia  6)  CEREBRAL EMBOLISM, WITH INFARCTION (07/02/2010) Plan: -supportive care, likely will not be a candidate for Coumadin in the future      STAFF NOTE:I, Dr Lavinia Sharps have personally reviewed patient's available data, including medical history, events of note, physical examination and test results as part of my evaluation. I have discussed with NP and other care providers such as RN and RRT.  In addition I personally evaluated patient and elicited key findings of upper gi bleed and precipitated by coumadin. Give PRBC. Call GI. When patient off ICU, hand over care back to teaching service. Rest per NP whose note is outlined above and that I agree with  critical care

## 2011-08-24 NOTE — Plan of Care (Signed)
Problem: Consults Goal: Diabetes Guidelines if Diabetic/Glucose > 140 If diabetic or lab glucose is > 140 mg/dl - Initiate Diabetes/Hyperglycemia Guidelines & Document Interventions  RN notified primary care/CCM of patient CBG>500 after intermittent custom insulin orders. RN awaiting rounding PA for new orders for hyperglycemia

## 2011-08-24 NOTE — Procedures (Signed)
Central Venous Catheter Insertion Procedure Note [name] [id number] April 17, 1933  Procedure: Insertion of Central Venous Catheter Indications: PRBC transfusion  Procedure Details Consent: From patient. Time Out: Verified patient identification, verified procedure, site/side was marked, verified correct patient position, special equipment/implants available, medications/allergies/relevent history reviewed, required imaging and test results available.   Maximum sterile technique was used including sterile barrier. Skin prep: Chlorhexidine; local anesthetic administered A antimicrobial bonded/coated triple lumen catheter was placed in the right IJ using the Seldinger technique.  Evaluation Blood flow GOOD Complications: NONE Patient did tolerate procedure well. Chest X-ray ordered to verify placement.  CXR: Pending.  [name] 08/24/2011, 6:43 AM

## 2011-08-25 ENCOUNTER — Encounter (HOSPITAL_COMMUNITY): Payer: Self-pay | Admitting: Internal Medicine

## 2011-08-25 DIAGNOSIS — Z7901 Long term (current) use of anticoagulants: Secondary | ICD-10-CM

## 2011-08-25 DIAGNOSIS — D62 Acute posthemorrhagic anemia: Secondary | ICD-10-CM | POA: Diagnosis present

## 2011-08-25 DIAGNOSIS — K922 Gastrointestinal hemorrhage, unspecified: Secondary | ICD-10-CM

## 2011-08-25 LAB — GLUCOSE, CAPILLARY
Glucose-Capillary: 133 mg/dL — ABNORMAL HIGH (ref 70–99)
Glucose-Capillary: 153 mg/dL — ABNORMAL HIGH (ref 70–99)
Glucose-Capillary: 183 mg/dL — ABNORMAL HIGH (ref 70–99)
Glucose-Capillary: 176 mg/dL — ABNORMAL HIGH (ref 70–99)
Glucose-Capillary: 160 mg/dL — ABNORMAL HIGH (ref 70–99)
Glucose-Capillary: 138 mg/dL — ABNORMAL HIGH (ref 70–99)
Glucose-Capillary: 235 mg/dL — ABNORMAL HIGH (ref 70–99)
Glucose-Capillary: 279 mg/dL — ABNORMAL HIGH (ref 70–99)
Glucose-Capillary: 255 mg/dL — ABNORMAL HIGH (ref 70–99)

## 2011-08-25 LAB — PREPARE FRESH FROZEN PLASMA

## 2011-08-25 LAB — BASIC METABOLIC PANEL
Calcium: 9.2 mg/dL (ref 8.4–10.5)
GFR calc Af Amer: 47 mL/min — ABNORMAL LOW (ref >90)
GFR calc non Af Amer: 40 mL/min — ABNORMAL LOW (ref >90)
Potassium: 3.7 mEq/L (ref 3.5–5.1)
Sodium: 147 mEq/L — ABNORMAL HIGH (ref 135–145)

## 2011-08-25 LAB — CBC
MCV: 81.6 fL (ref 78.0–100.0)
Platelets: 133 10*3/uL — ABNORMAL LOW (ref 150–400)
RBC: 3.31 MIL/uL — ABNORMAL LOW (ref 3.87–5.11)
RDW: 15.4 % (ref 11.5–15.5)
WBC: 13.5 10*3/uL — ABNORMAL HIGH (ref 4.0–10.5)

## 2011-08-25 LAB — PROTIME-INR
INR: 1.18 (ref 0.00–1.49)
Prothrombin Time: 15.3 seconds — ABNORMAL HIGH (ref 11.6–15.2)

## 2011-08-25 MED ORDER — PANTOPRAZOLE SODIUM 40 MG PO TBEC
40.0000 mg | DELAYED_RELEASE_TABLET | Freq: Two times a day (BID) | ORAL | Status: DC
  Administered 2011-08-25 – 2011-08-27 (×5): 40 mg via ORAL
  Filled 2011-08-25 (×5): qty 1

## 2011-08-25 MED ORDER — INSULIN ASPART 100 UNIT/ML ~~LOC~~ SOLN
0.0000 [IU] | Freq: Every day | SUBCUTANEOUS | Status: DC
  Administered 2011-08-25 – 2011-08-27 (×2): 3 [IU] via SUBCUTANEOUS
  Administered 2011-08-28: 2 [IU] via SUBCUTANEOUS
  Filled 2011-08-25: qty 3

## 2011-08-25 MED ORDER — CARVEDILOL 12.5 MG PO TABS
12.5000 mg | ORAL_TABLET | Freq: Two times a day (BID) | ORAL | Status: DC
  Administered 2011-08-25 – 2011-08-30 (×11): 12.5 mg via ORAL
  Filled 2011-08-25 (×14): qty 1

## 2011-08-25 MED ORDER — INSULIN ASPART 100 UNIT/ML ~~LOC~~ SOLN
6.0000 [IU] | Freq: Three times a day (TID) | SUBCUTANEOUS | Status: DC
  Administered 2011-08-25 – 2011-08-27 (×4): 6 [IU] via SUBCUTANEOUS

## 2011-08-25 MED ORDER — ZOLPIDEM TARTRATE 5 MG PO TABS: 5.0000 mg | ORAL_TABLET | Freq: Every evening | ORAL | Status: DC | PRN

## 2011-08-25 MED ORDER — ROSUVASTATIN CALCIUM 20 MG PO TABS
20.0000 mg | ORAL_TABLET | Freq: Every day | ORAL | Status: DC
  Administered 2011-08-25 – 2011-08-30 (×6): 20 mg via ORAL
  Filled 2011-08-25 (×6): qty 1

## 2011-08-25 MED ORDER — INSULIN ASPART 100 UNIT/ML ~~LOC~~ SOLN
0.0000 [IU] | Freq: Three times a day (TID) | SUBCUTANEOUS | Status: DC
  Administered 2011-08-25: 5 [IU] via SUBCUTANEOUS
  Administered 2011-08-25: 8 [IU] via SUBCUTANEOUS
  Administered 2011-08-26: 3 [IU] via SUBCUTANEOUS
  Administered 2011-08-26: 8 [IU] via SUBCUTANEOUS
  Administered 2011-08-27: 5 [IU] via SUBCUTANEOUS
  Administered 2011-08-27 (×2): 3 [IU] via SUBCUTANEOUS
  Administered 2011-08-28: 11 [IU] via SUBCUTANEOUS
  Filled 2011-08-25: qty 3

## 2011-08-25 MED ORDER — INSULIN GLARGINE 100 UNIT/ML ~~LOC~~ SOLN
16.0000 [IU] | Freq: Once | SUBCUTANEOUS | Status: AC
  Administered 2011-08-25: 16 [IU] via SUBCUTANEOUS

## 2011-08-25 MED ORDER — INSULIN ASPART 100 UNIT/ML ~~LOC~~ SOLN
3.0000 [IU] | Freq: Three times a day (TID) | SUBCUTANEOUS | Status: DC
  Administered 2011-08-25: 3 [IU] via SUBCUTANEOUS

## 2011-08-25 MED ORDER — INSULIN GLARGINE 100 UNIT/ML ~~LOC~~ SOLN: 26.0000 [IU] | Freq: Every day | SUBCUTANEOUS | Status: DC

## 2011-08-25 MED ORDER — INSULIN GLARGINE 100 UNIT/ML ~~LOC~~ SOLN
10.0000 [IU] | Freq: Two times a day (BID) | SUBCUTANEOUS | Status: DC
  Administered 2011-08-25: 10 [IU] via SUBCUTANEOUS
  Filled 2011-08-25: qty 3

## 2011-08-25 NOTE — Progress Notes (Signed)
Subjective: [name] is a 77 year old woman past medical history significant for coronary artery disease, congestive heart failure (EF of 25-30%), diabetes mellitus (last A1c: 13.7 03/14/2011) and cerebral embolism on chronic warfarin. She presented on November 11 with dizziness, melena, a supratherapeutic INR of 3.4, and hemoglobin of 4.9. She was originally admitted by the medicine teaching service however due to inability to gain vascular access she was transferred to [facility]. She received 2 units of packed red blood cells, in the ICU and on November 12 was stable enough to be transferred back to the teaching service.  She is feeling very well today. She denies lightheadedness, dizziness, shortness of breath, chest pain, or pain anywhere. She states her last bowel movement was this afternoon. It was still dark-colored.   Objective: Vital signs in last 24 hours: Filed Vitals:   08/25/11 0600 08/25/11 0800 08/25/11 1200 08/25/11 1600  BP: 160/71     Pulse: 88     Temp:  98.8 F (37.1 C) 97.5 F (36.4 C) 98.5 F (36.9 C)  TempSrc:  Oral Axillary Oral  Resp: 16     Height:      Weight:      SpO2: 100%      Weight change:   Intake/Output Summary (Last 24 hours) at 08/25/11 1658 Last data filed at 08/25/11 1200  Gross per 24 hour  Intake 2540.67 ml  Output   3025 ml  Net -484.33 ml   Physical Exam:  General: resting in chair HEENT: PERRL, EOMI, no scleral icterus Cardiac: RRR, no rubs, murmurs or gallops 3/6 systolic murmur best heard at the apex. Pulm: clear to auscultation bilaterally, moving normal volumes of air Abd: soft, nontender, nondistended, BS present Ext: warm and well perfused, trace pedal edema left greater than right Neuro: alert and oriented X3, cranial nerves II-XII grossly intact, strength and sensation to light touch equal in bilateral upper and lower extremities  Lab Results: Basic Metabolic Panel:  Lab 08/25/11 9562 08/24/11 1139  NA 147* 143  K 3.7 4.1    CL 116* 110  CO2 21 18*  GLUCOSE 199* 480*  BUN 41* 83*  CREATININE 1.26* 1.60*  CALCIUM 9.2 9.5  MG -- --  PHOS -- --   CBC:  Lab 08/25/11 0455 08/24/11 2030  WBC 13.5* 11.9*  NEUTROABS -- --  HGB 9.3* 8.7*  HCT 27.0* 25.1*  MCV 81.6 81.2  PLT 133* 155   CBG:  Lab 08/25/11 1218 08/25/11 0905 08/25/11 0810 08/25/11 0704 08/25/11 0605 08/25/11 0458  GLUCAP 235* 138* 160* 176* 183* 209*   Hemoglobin A1C: Last A1c 13.7 in June of this year.  Coagulation:  Lab 08/25/11 0455 08/24/11 2030 08/24/11 1139 08/24/11 0605  LABPROT 15.3* 16.5* 19.6* 25.0*  INR 1.18 1.31 1.63* 2.22*   Studies/Results: Dg Chest Portable 1 View  08/24/2011  *RADIOLOGY REPORT*  Clinical Data: Central line placement  PORTABLE CHEST - 1 VIEW  Comparison: 8/31/ 2012  Findings: Right IJ venous catheter with its tip in the right atrium, 4 cm below the cavoatrial junction.  The lungs are essentially clear. No pleural effusion or pneumothorax.  Mild cardiomegaly.  IMPRESSION: Right IJ venous catheter with its tip in the right atrium, 4 cm below the cavoatrial junction.  No pneumothorax.  Original Report Authenticated By: Charline Bills, M.D.   Medications: I have reviewed the patient's current medications. Scheduled Meds:    . antiseptic oral rinse  15 mL Mouth Rinse q12n4p  . carvedilol  12.5 mg  Oral BID WC  . insulin aspart  0-15 Units Subcutaneous TID WC  . insulin aspart  0-5 Units Subcutaneous QHS  . insulin aspart  6 Units Subcutaneous TID WC  . insulin glargine  16 Units Subcutaneous Once  . insulin glargine  26 Units Subcutaneous Daily  . pantoprazole  40 mg Oral BID AC  . rosuvastatin  20 mg Oral q1800  . salmeterol  1 puff Inhalation Daily  . DISCONTD: insulin aspart  3 Units Subcutaneous TID WC  . DISCONTD: insulin glargine  10 Units Subcutaneous BID  . DISCONTD: pantoprazole (PROTONIX) IV  40 mg Intravenous Q12H   Continuous Infusions:    . DISCONTD: sodium chloride 18 mL/hr at  08/25/11 0800  . DISCONTD: sodium chloride 75 mL/hr at 08/25/11 0800  . DISCONTD: insulin (NOVOLIN-R) infusion 2 mL/hr at 08/25/11 0800   PRN Meds:.albuterol, nitroGLYCERIN, zolpidem  Assessment/Plan: Principal Problem: 1) GI bleeding - FOBT positive with recent drop in hemoglobin, supratherapeutic INR, and history of melena and weakness consistent with acute GI bleed. status post 2 units packed red blood cells. Anticoagulation reversed with vitamin K and FFP, now with normal INR. Aspirin and warfarin held. Hemoglobin stable at 9.3. Patient has baseline anemia with hemoglobin approximately 10. Was initially 4.9 at admission. Goal hemoglobin greater than 8. -- GI consult placed, plan is tentatively for EGD the morning. Will place n.p.o. after midnight. -- Continue twice a day IV pantoprazole for today. Will change to by mouth pantoprazole after procedure tomorrow  Active Problems: 2) DIABETES MELLITUS, TYPE II -  -- We'll check A1c -- Currently ordered for 10 units of Lantus daily and 3 units NovoLog 3 times a day cc plus SSI. Will titrate up as had gotten about 48 units of short acting insulin/24 hours in addition to lantus yesterday. Given these needs will likely need at least 26 of lantus and 6 units novolog TIDAC.  -- Will ask the patient as to why her A1c jumped from 8.7 in October 2011 to 13.7 in June of this year.  3) ACUTE ON CHRONIC  renal insufficiency - baseline creatinine 1.2-1.3. Was elevated to 1.4 on admission -  currently 1.26. Likely prerenal component to current bump in creatinine, correcting with PRBCs and volume.  -- We will continue to volume resuscitate gently given CHF.  4) CEREBRAL EMBOLISM, WITH INFARCTION -  Supratherapeutic INR, now corrected to 1.18. Off all anticoagulants. Was on warfarin secondary to cerebral embolism. Will review records and determine if she should ever be restarted on warfarin.  5) Hypertension- Outpatient regimen held at admission secondary to  soft blood pressure. We'll continue to hold ACE given acute bump in creatinine. Will restart beta blocker. we will add back isosorbide mononitrate and amlodipine as blood pressure allows. -- Restart carvedilol 12.5 twice a day  6) Stable chronic CONGESTIVE HEART FAILURE - no evidence of pulmonary edema on exam. O2 sats greater than 98% on room air. We'll continue to monitor given volume repletion. -- Restart carvedilol per above  7) CAD (coronary artery disease) - ASA held in the setting of acute GI bleed. ACE held given acute renal injury. Will had back beta blocker. Will add back nitrate if blood pressure supports -- We'll restart rosuvastatin 20 mg daily     LOS: 2 days   Tamari Redwine 08/25/2011, 4:58 PM

## 2011-08-25 NOTE — Progress Notes (Signed)
CRITICAL CARE MEDICINE FOLLOWUP NOTE   Referring Physician: Internal Medicine Teaching Service (Dr Eben Burow) Reason For Consult : GI bleeding  Brief history History Of Present Illness  : The patient is a 77 year old lady with multiple medical problems:  including coronary artery disease,congestive heart failure with EF of 25-30%, and recent history of cerebral embolism for which she was on Coumadin. Presented with generalized weakness and melena on 11/11. Her INR on admission was 3.4 and Hb<5. She is admitted in the ICU for hemodynamic monitoring. Critical care services were asked to assume her care.    Lines/tubes Right IJ CVL 11/11 >>  Culture data/sepsis markers  Best practice PPI 11-11 PAS 11-11  Protocols/consults [facility] 11/12  Events/studies  Subjective Feels better. No new complaints. On insulin gtt. No need for ICU  Objective Temp:  [98.1 F (36.7 C)-99.4 F (37.4 C)] 98.8 F (37.1 C) (11/12 0800) Pulse Rate:  [87-97] 88  (11/12 0600) Resp:  [12-22] 16  (11/12 0600) BP: (98-162)/(34-71) 160/71 mmHg (11/12 0600) SpO2:  [99 %-100 %] 100 % (11/12 0600)  Physical Examination Neuro:  Alert, ox3 HEENT: No JVD Heart:  RRR, no M/R/G Lungs:  Bilateral air entry, no W/R/R Abdomen:  Soft, non tender, bowel sounds present Extremities:  Bowel sounds present  Labs Lab Results  Component Value Date   WBC 13.5* 08/25/2011   HGB 9.3* 08/25/2011   HCT 27.0* 08/25/2011   MCV 81.6 08/25/2011   PLT 133* 08/25/2011  ]  Lab Results  Component Value Date   INR 1.18 08/25/2011   INR 1.31 08/24/2011   INR 1.63* 08/24/2011   . Lab Results  Component Value Date   CREATININE 1.26* 08/25/2011   BUN 41* 08/25/2011   NA 147* 08/25/2011   K 3.7 08/25/2011   CL 116* 08/25/2011   CO2 21 08/25/2011    Imaging No new cxr.   Assessment/plan 1) GI bleeding/Anemia (08/24/2011). Appears to be upper GI in origin. No doubt complicated by supratherapeutic INR.Marland KitchenMarland KitchenThe hemoglobin  of 4.9 represents pre-transfusion study. She is now finishing her second of 2 units of blood.  Lab 08/25/11 0455 08/24/11 2030 08/24/11 0800  HGB 9.3* 8.7* 4.9*  ] Plan: - initiate blood transfusion protocol - continue IV Protonix -close followup on hemoglobin and hematocrit, given her active bleeding, and cardiomyopathy,we will aim for a hemoglobin greater than 8 -Correct coagulopathy: Discontinue aspirin and Coumadin -GI consultation, have spoken with Dr. Arlyce Dice with GI. They will plan on formal consultation on 08/25/11.  2) CONGESTIVE HEART FAILURE/CAD (Known EF 25-30%). Compensated  3) CRI (chronic renal insufficiency), now with acute renal failure. Improving. Cont to monitor Recent Labs  Basename 08/25/11 0455 08/24/11 1139 08/24/11 0605   CREATININE 1.26* 1.60* 1.82*  ]   4) coagulopathy, in the setting of Coumadin administration. Trending down nicely following vitamin K administration Lab Results  Component Value Date   INR 1.18 08/25/2011   INR 1.31 08/24/2011   INR 1.63* 08/24/2011   Corrected. Depending on GI findings, will need to consider safety of restarting warfarin  5 ) DIABETES MELLITUS, TYPE II , hyperglycemia CBG (last 3)   Basename 08/25/11 0810 08/25/11 0704 08/25/11 0605  GLUCAP 160* 176* 183*   Transition to SQ insulin - lantus and SSI  6) CEREBRAL EMBOLISM, WITH INFARCTION (07/02/2010) Inactive   Transfer to SDU and back to [facility]. Discussed with [facility] resident. GI to see today. PCCM will sign off. Please call if we can be of further assistance.  Please remove CVL when confident it is no longer needed (would wait until after endoscopic eval).  Billy Fischer, MD;  PCCM service; [place] [phone]    STAFF NOTE:I, Dr Lavinia Sharps have personally reviewed patient's available data, including medical history, events of note, physical examination and test results as part of my evaluation. I have discussed with NP and other care providers such as RN and  RRT.  In addition I personally evaluated patient and elicited key findings of upper gi bleed and precipitated by coumadin. Give PRBC. Call GI. When patient off ICU, hand over care back to teaching service. Rest per NP whose note is outlined above and that I agree with  critical care

## 2011-08-25 NOTE — Progress Notes (Signed)
Inpatient Diabetes Program Recommendations  AACE/ADA: New Consensus Statement on Inpatient Glycemic Control (2009)  Target Ranges:  Prepandial:   less than 140 mg/dL      Peak postprandial:   less than 180 mg/dL (1-2 hours)      Critically ill patients:  140 - 180 mg/dL   Reason for Visit: Transitioned pt off IV Insulin drip this morning.  Inpatient Diabetes Program Recommendations HgbA1C: Please check current A1C.  Last A1C was 13.7% (03/14/11)  Note: Spoke to pt about home control.  Encouraged pt to check CBGs tid at home.  Pt told me she lives with her son.  Sees MD in the [facility].  Has seen Jamison Neighbor, CDE in the clinic before.  Have ordered Basic DM education review by the RN.  Would likely benefit from Novolog tid at home to better control CBGs.

## 2011-08-25 NOTE — Consult Note (Signed)
Acute upper GI bleed with melena while on warfarin for prior embolic stroke (after cardiac cath).  She is improving and INR < 2.  For EGD to evaluate and treat tomorrow. I have explained risks and benefits of the procedure to patient and family and provided alternatives and likelihood of success. She agrees to proceed.  She is at higher risk of sedation and procedure due to comorbidities but potential benefits warrant the EGD.
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
## 2011-08-26 ENCOUNTER — Encounter (HOSPITAL_COMMUNITY)
Admission: EM | Disposition: A | Discharge status: Home / Self Care | Payer: Self-pay | Source: Home / Self Care | Attending: Internal Medicine

## 2011-08-26 ENCOUNTER — Encounter (HOSPITAL_COMMUNITY): Payer: Self-pay | Admitting: Internal Medicine

## 2011-08-26 DIAGNOSIS — I509 Heart failure, unspecified: Secondary | ICD-10-CM

## 2011-08-26 DIAGNOSIS — K922 Gastrointestinal hemorrhage, unspecified: Secondary | ICD-10-CM

## 2011-08-26 DIAGNOSIS — K2970 Gastritis, unspecified, without bleeding: Secondary | ICD-10-CM

## 2011-08-26 DIAGNOSIS — K2990 Gastroduodenitis, unspecified, without bleeding: Secondary | ICD-10-CM

## 2011-08-26 DIAGNOSIS — R7309 Other abnormal glucose: Secondary | ICD-10-CM

## 2011-08-26 HISTORY — PX: ESOPHAGOGASTRODUODENOSCOPY: SHX5428

## 2011-08-26 LAB — BASIC METABOLIC PANEL
Calcium: 8.6 mg/dL (ref 8.4–10.5)
Creatinine, Ser: 1.34 mg/dL — ABNORMAL HIGH (ref 0.50–1.10)
GFR calc non Af Amer: 37 mL/min — ABNORMAL LOW (ref >90)
Sodium: 140 mEq/L (ref 135–145)

## 2011-08-26 LAB — HEMOGLOBIN A1C
Hgb A1c MFr Bld: 8.8 % — ABNORMAL HIGH (ref <5.7)
Mean Plasma Glucose: 206 mg/dL — ABNORMAL HIGH (ref <117)

## 2011-08-26 LAB — GLUCOSE, CAPILLARY: Glucose-Capillary: 151 mg/dL — ABNORMAL HIGH (ref 70–99)

## 2011-08-26 LAB — CBC
MCH: 28.2 pg (ref 26.0–34.0)
MCHC: 33.7 g/dL (ref 30.0–36.0)
MCV: 83.7 fL (ref 78.0–100.0)
Platelets: 129 10*3/uL — ABNORMAL LOW (ref 150–400)

## 2011-08-26 SURGERY — EGD (ESOPHAGOGASTRODUODENOSCOPY)
Anesthesia: Moderate Sedation

## 2011-08-26 MED ORDER — METOCLOPRAMIDE HCL 5 MG/ML IJ SOLN
10.0000 mg | Freq: Once | INTRAMUSCULAR | Status: AC
  Administered 2011-08-26: 10 mg via INTRAVENOUS
  Filled 2011-08-26: qty 2

## 2011-08-26 MED ORDER — POTASSIUM CHLORIDE 10 MEQ/100ML IV SOLN
10.0000 meq | INTRAVENOUS | Status: AC
  Administered 2011-08-26: 10 meq via INTRAVENOUS
  Filled 2011-08-26: qty 100

## 2011-08-26 MED ORDER — PEG 3350-KCL-NA BICARB-NACL 420 G PO SOLR
4000.0000 mL | Freq: Once | ORAL | Status: AC
  Administered 2011-08-26: 4000 mL via ORAL

## 2011-08-26 MED ORDER — POTASSIUM CHLORIDE CRYS ER 20 MEQ PO TBCR
30.0000 meq | EXTENDED_RELEASE_TABLET | Freq: Once | ORAL | Status: AC
  Administered 2011-08-26: 30 meq via ORAL
  Filled 2011-08-26 (×2): qty 1

## 2011-08-26 MED ORDER — FENTANYL CITRATE 0.05 MG/ML IJ SOLN
INTRAMUSCULAR | Status: AC
  Filled 2011-08-26: qty 2

## 2011-08-26 MED ORDER — BUTAMBEN-TETRACAINE-BENZOCAINE 2-2-14 % EX AERO
INHALATION_SPRAY | CUTANEOUS | Status: DC | PRN
  Administered 2011-08-26: 2 via TOPICAL

## 2011-08-26 MED ORDER — MIDAZOLAM HCL 10 MG/2ML IJ SOLN
INTRAMUSCULAR | Status: DC | PRN
  Administered 2011-08-26: 1 mg via INTRAVENOUS
  Administered 2011-08-26: 2 mg via INTRAVENOUS

## 2011-08-26 MED ORDER — PEG 3350-KCL-NA BICARB-NACL 420 G PO SOLR: 2000.0000 mL | Freq: Once | ORAL | Status: DC

## 2011-08-26 MED ORDER — FENTANYL NICU IV SYRINGE 50 MCG/ML
INJECTION | INTRAMUSCULAR | Status: DC | PRN
  Administered 2011-08-26: 25 ug via INTRAVENOUS

## 2011-08-26 MED ORDER — PEG 3350-KCL-NA BICARB-NACL 420 G PO SOLR
2000.0000 mL | Freq: Once | ORAL | Status: DC
  Filled 2011-08-26: qty 4000

## 2011-08-26 MED ORDER — METOCLOPRAMIDE HCL 5 MG/ML IJ SOLN
10.0000 mg | Freq: Once | INTRAMUSCULAR | Status: DC
  Filled 2011-08-26: qty 2

## 2011-08-26 MED ORDER — SODIUM CHLORIDE 0.9 % IV SOLN
INTRAVENOUS | Status: DC
  Administered 2011-08-26: 500 mL via INTRAVENOUS
  Administered 2011-08-28: 10:00:00 via INTRAVENOUS

## 2011-08-26 MED ORDER — INSULIN GLARGINE 100 UNIT/ML ~~LOC~~ SOLN
13.0000 [IU] | Freq: Every day | SUBCUTANEOUS | Status: DC
  Administered 2011-08-26 – 2011-08-27 (×2): 13 [IU] via SUBCUTANEOUS

## 2011-08-26 MED ORDER — MIDAZOLAM HCL 10 MG/2ML IJ SOLN
INTRAMUSCULAR | Status: AC
  Filled 2011-08-26: qty 2

## 2011-08-26 NOTE — Brief Op Note (Signed)
  EGD with bx  Meds: Versed 3 mg IV and Fentanyl 25 ug IV, topical cetacaine  Findings  Erosive gastritis - multiple superficial erosions in antrum  CLO bx taken  Otherwise NL  Plan:  Colonoscopy tomorrow as do not think this explains all blood loss  Full report to follow.
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
## 2011-08-26 NOTE — Progress Notes (Signed)
Subjective: She is feeling very well today. She denies lightheadedness, dizziness, shortness of breath, chest pain, or pain anywhere. No issues after EGD. No N/V or other complaint post-anasthesia  Objective: Vital signs in last 24 hours: Filed Vitals:   08/26/11 1640 08/26/11 1645 08/26/11 1649 08/26/11 1659  BP: 143/51 131/46 130/67 125/53  Pulse:   61 60  Temp:      TempSrc:      Resp: 15 15 15 14   Height:      Weight:      SpO2: 100% 100% 100% 100%   Weight change:   Intake/Output Summary (Last 24 hours) at 08/26/11 1709 Last data filed at 08/26/11 1700  Gross per 24 hour  Intake    765 ml  Output    700 ml  Net     65 ml   Physical Exam: General: resting in bed Cardiac: RRR, no rubs, murmurs or gallops 3/6 systolic murmur best heard at the apex. Pulm: clear to auscultation bilaterally, moving normal volumes of air Abd: soft, nontender, nondistended, BS present Ext: warm and well perfused, trace pedal edema  Neuro: alert and oriented X3, cranial nerves II-XII grossly intact, No focal deficets  Lab Results: Basic Metabolic Panel:  Lab 08/26/11 6045 08/25/11 0455  NA 140 147*  K 3.4* 3.7  CL 109 116*  CO2 23 21  GLUCOSE 283* 199*  BUN 27* 41*  CREATININE 1.34* 1.26*  CALCIUM 8.6 9.2  MG -- --  PHOS -- --     Lab 08/26/11 0635 08/25/11 0455  WBC 8.4 13.5*  NEUTROABS -- --  HGB 8.8* 9.3*  HCT 26.1* 27.0*  MCV 83.7 81.6  PLT 129* 133*   CBG:  Lab 08/26/11 1157 08/26/11 0800 08/25/11 2150 08/25/11 1659 08/25/11 1218 08/25/11 0905  GLUCAP 151* 270* 255* 279* 235* 138*   Hemoglobin A1C:  Lab 08/26/11 0635  HGBA1C 8.8*    Coagulation:  Lab 08/25/11 0455 08/24/11 2030 08/24/11 1139 08/24/11 0605  LABPROT 15.3* 16.5* 19.6* 25.0*  INR 1.18 1.31 1.63* 2.22*   Medications: I have reviewed the patient's current medications. Scheduled Meds:    . antiseptic oral rinse  15 mL Mouth Rinse q12n4p  . carvedilol  12.5 mg Oral BID WC  . insulin aspart   0-15 Units Subcutaneous TID WC  . insulin aspart  0-5 Units Subcutaneous QHS  . insulin aspart  6 Units Subcutaneous TID WC  . insulin glargine  13 Units Subcutaneous Daily  . pantoprazole  40 mg Oral BID AC  . potassium chloride  30 mEq Oral Once  . potassium chloride  10 mEq Intravenous Q1 Hr x 2  . rosuvastatin  20 mg Oral q1800  . salmeterol  1 puff Inhalation Daily  . DISCONTD: insulin glargine  26 Units Subcutaneous Daily   Continuous Infusions:    . sodium chloride 500 mL (08/26/11 1425)   PRN Meds:.albuterol, nitroGLYCERIN, zolpidem, DISCONTD: butamben-tetracaine-benzocaine, DISCONTD: fentaNYL, DISCONTD: midazolam Assessment/Plan: Ms. Jorgenson is a 77 year old woman past medical history significant for coronary artery disease, congestive heart failure (EF of 25-30%), diabetes mellitus (last A1c: 13.7 03/14/2011) and cerebral embolism on chronic warfarin. She presented on November 11 with dizziness, melena, a supratherapeutic INR of 3.4, and hemoglobin of 4.9. She was originally admitted by the medicine teaching service however due to inability to gain vascular access she was transferred to [facility] She received 2 units of packed red blood cells, in the ICU and on November 12 was stable enough to be  transferred back to the teaching service.   1) GI bleeding and acute poshemorrhagic anemia- FOBT positive with recent drop in hemoglobin to 4.9, supratherapeutic INR, and history of melena and weakness consistent with acute GI bleed. status post 2 units packed red blood cells. Anticoagulation reversed with vitamin K and FFP, now with normal INR. Aspirin and warfarin held. Hemoglobin stable at 8.8. Patient has baseline anemia with hemoglobin approximately 10. Goal hemoglobin greater than 8. -- GI consult placed, EGD done today showed erosive gastritis.  No obvous source of bleeding -- GI plans to do a colonoscopy tomorrow in search of source -- Continue twice a day pantoprazole and will increase  to 40 mg BID tomorrow   Active Problems: 2) DIABETES MELLITUS, TYPE II -  -- A1c 8.8 --Baseline when taking PO meals should be at least  26 of lantus and 6 units novolog TIDAC. Given her history of fasting night time hypoglycemia, I will decrease her lantus to 13 units this morning while she is NPO as the time of her colonoscopy is possibly in the afternoon tomorrow.   3) ACUTE ON CHRONIC  renal insufficiency - baseline creatinine 1.2-1.3. Was elevated to 1.4 on admission -  currently 1.34. Likely prerenal component to current bump in creatinine, correcting with PRBCs and volume. Most recent bump likely 2/2 being npo over night -- We will continue to volume resuscitate gently given CHF.  4) CEREBRAL EMBOLISM, WITH INFARCTION -  Supratherapeutic INR, now corrected to 1.18. Off all anticoagulants. Was on warfarin secondary to cerebral embolism. Will review records and determine if she should ever be restarted on warfarin.  5) Hypertension- Outpatient regimen held at admission secondary to soft blood pressure. We'll continue to hold ACE given acute bump in creatinine. Will restart beta blocker. we will add back isosorbide mononitrate and amlodipine as blood pressure allows. -- Carvedilol 12.5 twice a day  6) Stable chronic CONGESTIVE HEART FAILURE - no evidence of pulmonary edema on exam. O2 sats greater than 98% on room air. We'll continue to monitor given volume repletion. --  carvedilol per above  7) CAD (coronary artery disease) - ASA held in the setting of acute GI bleed. ACE held given acute renal injury. Will had back beta blocker. Will add back nitrate if blood pressure supports -- Rosuvastatin 20 mg daily     LOS: 3 days   Robinette Esters 08/26/2011, 5:09 PM

## 2011-08-26 NOTE — Progress Notes (Signed)
Internal Medicine Attending  Date: 08/26/2011  Patient name: [name] Medical record number: [id number] Date of birth: [birth year] Age: 77 y.o. Gender: female  I saw and evaluated the patient and discussed her care with the resident Dr. Candy Sledge; see his note for details of clinical findings and plans.  Patient is feeling better following transfusion, with hemoglobin improved to 8.8 today.  Exam is notable for clear lungs; regular rhythm; soft nontender abdomen.  Plan is for EGD to assess source of blood loss; follow hemoglobin.

## 2011-08-27 ENCOUNTER — Encounter (HOSPITAL_COMMUNITY)
Admission: EM | Disposition: A | Discharge status: Home / Self Care | Payer: Self-pay | Source: Home / Self Care | Attending: Internal Medicine

## 2011-08-27 DIAGNOSIS — D62 Acute posthemorrhagic anemia: Secondary | ICD-10-CM

## 2011-08-27 DIAGNOSIS — Z7901 Long term (current) use of anticoagulants: Secondary | ICD-10-CM

## 2011-08-27 LAB — CBC
HCT: 27.4 % — ABNORMAL LOW (ref 36.0–46.0)
MCHC: 33.9 g/dL (ref 30.0–36.0)
MCV: 84 fL (ref 78.0–100.0)
RDW: 15.4 % (ref 11.5–15.5)

## 2011-08-27 LAB — BASIC METABOLIC PANEL
BUN: 13 mg/dL (ref 6–23)
Creatinine, Ser: 1.03 mg/dL (ref 0.50–1.10)
GFR calc Af Amer: 60 mL/min — ABNORMAL LOW (ref >90)
GFR calc non Af Amer: 51 mL/min — ABNORMAL LOW (ref >90)

## 2011-08-27 LAB — GLUCOSE, CAPILLARY
Glucose-Capillary: 193 mg/dL — ABNORMAL HIGH (ref 70–99)
Glucose-Capillary: 221 mg/dL — ABNORMAL HIGH (ref 70–99)
Glucose-Capillary: 251 mg/dL — ABNORMAL HIGH (ref 70–99)

## 2011-08-27 LAB — CLOTEST (H. PYLORI), BIOPSY: Helicobacter screen: NEGATIVE

## 2011-08-27 SURGERY — COLONOSCOPY
Anesthesia: Moderate Sedation

## 2011-08-27 MED ORDER — FENTANYL CITRATE 0.05 MG/ML IJ SOLN
INTRAMUSCULAR | Status: AC
  Filled 2011-08-27: qty 2

## 2011-08-27 MED ORDER — PEG 3350-KCL-NA BICARB-NACL 420 G PO SOLR: 2000.0000 mL | Freq: Once | ORAL | Status: DC

## 2011-08-27 MED ORDER — MIDAZOLAM HCL 10 MG/2ML IJ SOLN
INTRAMUSCULAR | Status: AC
  Filled 2011-08-27: qty 2

## 2011-08-27 MED ORDER — PEG 3350-KCL-NA BICARB-NACL 420 G PO SOLR
2000.0000 mL | Freq: Once | ORAL | Status: AC
  Administered 2011-08-28: 2000 mL via ORAL
  Filled 2011-08-27: qty 4000

## 2011-08-27 MED ORDER — DIPHENHYDRAMINE HCL 50 MG/ML IJ SOLN
INTRAMUSCULAR | Status: AC
  Filled 2011-08-27: qty 1

## 2011-08-27 MED ORDER — PEG 3350-KCL-NA BICARB-NACL 420 G PO SOLR
2000.0000 mL | Freq: Once | ORAL | Status: AC
  Administered 2011-08-27: 2000 mL via ORAL
  Filled 2011-08-27 (×2): qty 4000

## 2011-08-27 NOTE — Progress Notes (Signed)
Subjective:  She is feeling very well today after a long night of being on the toilet for colonoscopy prep. She denies lightheadedness, dizziness, shortness of breath, chest pain, or pain anywhere. No issues after EGD. No N/V or other complaint postanesthesia. Anxious to see results of colonoscopy Luckily she is first on the list this AM  Objective: Vital signs in last 24 hours: Filed Vitals:   08/27/11 0300 08/27/11 0400 08/27/11 0500 08/27/11 0600  BP: 157/57 133/59 138/51   Pulse: 66 64 71 71  Temp:  98.6 F (37 C)    TempSrc:  Oral    Resp: 17 14 18 14   Height:      Weight:      SpO2: 100% 100% 100% 100%   Weight change:   Intake/Output Summary (Last 24 hours) at 08/27/11 0816 Last data filed at 08/27/11 0600  Gross per 24 hour  Intake   2900 ml  Output    502 ml  Net   2398 ml   Physical Exam: General: resting in bed Cardiac: RRR, no rubs, murmurs or gallops 3/6 systolic murmur best heard at the apex. Pulm: clear to auscultation bilaterally (anteriorly), moving normal volumes of air Abd: soft, nontender, nondistended, BS hyperactive Ext: warm and well perfused, trace pedal edema  Neuro: alert and oriented X3, cranial nerves II-XII grossly intact, No focal deficits  Lab Results: Basic Metabolic Panel:  Lab 08/27/11 8119 08/26/11 0635  NA 139 140  K 4.0 3.4*  CL 110 109  CO2 20 23  GLUCOSE 152* 283*  BUN 13 27*  CREATININE 1.03 1.34*  CALCIUM 8.6 8.6  MG -- --  PHOS -- --      Lab 08/27/11 0600 08/26/11 0635  WBC 6.4 8.4  NEUTROABS -- --  HGB 9.3* 8.8*  HCT 27.4* 26.1*  MCV 84.0 83.7  PLT PENDING 129*   CBG:  Lab 08/26/11 2141 08/26/11 1157 08/26/11 0800 08/25/11 2150 08/25/11 1659 08/25/11 1218  GLUCAP 173* 151* 270* 255* 279* 235*   Hemoglobin A1C:  Lab 08/26/11 0635  HGBA1C 8.8*    Coagulation:  Lab 08/25/11 0455 08/24/11 2030 08/24/11 1139 08/24/11 0605  LABPROT 15.3* 16.5* 19.6* 25.0*  INR 1.18 1.31 1.63* 2.22*    Medications: I  have reviewed the patient's current medications. Scheduled Meds:    . antiseptic oral rinse  15 mL Mouth Rinse q12n4p  . carvedilol  12.5 mg Oral BID WC  . insulin aspart  0-15 Units Subcutaneous TID WC  . insulin aspart  0-5 Units Subcutaneous QHS  . insulin aspart  6 Units Subcutaneous TID WC  . insulin glargine  13 Units Subcutaneous Daily  . metoCLOPramide (REGLAN) injection  10 mg Intravenous Once  . pantoprazole  40 mg Oral BID AC  . polyethylene glycol-electrolytes  4,000 mL Oral Once  . potassium chloride  30 mEq Oral Once  . potassium chloride  10 mEq Intravenous Q1 Hr x 2  . rosuvastatin  20 mg Oral q1800  . salmeterol  1 puff Inhalation Daily  . DISCONTD: insulin glargine  26 Units Subcutaneous Daily  . DISCONTD: metoCLOPramide (REGLAN) injection  10 mg Intravenous Once  . DISCONTD: polyethylene glycol-electrolytes  2,000 mL Oral Once  . DISCONTD: polyethylene glycol-electrolytes  2,000 mL Oral Once   Continuous Infusions:    . sodium chloride 500 mL (08/26/11 1425)   PRN Meds:.albuterol, nitroGLYCERIN, zolpidem, DISCONTD: butamben-tetracaine-benzocaine, DISCONTD: fentaNYL, DISCONTD: midazolam Assessment/Plan: Ms. Sugarman is a 77 year old woman past medical history significant for  coronary artery disease, congestive heart failure (EF of 25-30%), diabetes mellitus (last A1c: 13.7 03/14/2011) and cerebral embolism on chronic warfarin. She presented on November 11 with dizziness, melena, a supratherapeutic INR of 3.4, and hemoglobin of 4.9. She was originally admitted by the medicine teaching service however due to inability to gain vascular access she was transferred to [facility]. She received 2 units of packed red blood cells, in the ICU and on November 12 was stable enough to be transferred back to the teaching service. 11/13 EGD showed erosive gastritis.  No obvous source of bleeding.    1) GI bleeding and acute poshemorrhagic anemia- FOBT positive with recent drop in hemoglobin  to 4.9, supratherapeutic INR, and history of melena and weakness consistent with acute GI bleed. status post 2 units packed red blood cells. Anticoagulation reversed with vitamin K and FFP, now with normal INR. Aspirin and warfarin held. Hemoglobin stable at  9.3. Patient has baseline anemia with hemoglobin approximately 10. Goal hemoglobin greater than 8. EGD yesterday did not show a source. -- GI consult placed, Colonoscopy this AM -- Continue twice a day pantoprazole and will increase to 40 mg BID tomorrow   Active Problems: 2) DIABETES MELLITUS, TYPE II -  -- A1c 8.8 --Baseline when taking PO meals should be at least  26 of lantus and 6 units novolog TIDAC. Given her history of fasting night time hypoglycemia, lantus was decreased while NPO. Will titrate up as takes PO meals post colonoscopy.  3) ACUTE ON CHRONIC  renal insufficiency - baseline creatinine 1.2-1.3. Was elevated to 1.4 on admission -  currently 1.03.  4) CEREBRAL EMBOLISM, WITH INFARCTION -  Supratherapeutic INR, now corrected to 1.18. Off all anticoagulants. Was on warfarin secondary to cerebral embolism. Will review records and determine if she should ever be restarted on warfarin.  5) Hypertension- Outpatient regimen held at admission secondary to soft blood pressure. We'll continue to hold ACE given acute bump in creatinine. Will restart beta blocker. we will add back isosorbide mononitrate and amlodipine as blood pressure allows. -- Carvedilol 12.5 twice a day  6) Stable chronic CONGESTIVE HEART FAILURE - no evidence of pulmonary edema on exam. O2 sats greater than 98% on room air. We'll continue to monitor given volume repletion. --  carvedilol per above  7) CAD (coronary artery disease) - ASA held in the setting of acute GI bleed. ACE held given acute renal injury. Will had back beta blocker. Will add back nitrate if blood pressure supports -- Rosuvastatin 20 mg daily   8) DVT proph: SCDs given acute bleed   LOS: 4  days   Wymon Swaney 08/27/2011, 8:16 AM

## 2011-08-27 NOTE — Progress Notes (Signed)
Internal Medicine Attending  Date: 08/27/2011  Patient name: [name] Medical record number: [id number] Date of birth: 29-Aug-1933 Age: 77 y.o. Gender: female  I saw and evaluated the patient. I reviewed the resident's note by Dr. Candy Sledge and I agree with the resident's findings and plans as documented in his note, with the additional comment that colonoscopy was postponed until tomorrow due to inadequate prep.

## 2011-08-27 NOTE — Progress Notes (Signed)
Agree with Ms. [name]. Colonoscopy tomorrow.

## 2011-08-27 NOTE — Progress Notes (Signed)
     [facility] Daily Rounding Note 08/27/2011, 9:32 AM  SUBJECTIVE: Pt not adequately prepped , colonoscopy rescheduled for tomorrow. No blood seen in stool OBJECTIVE:  General: Obese, chronically ill.  Vital signs in last 24 hours: Temp:  [98 F (36.7 C)-99.7 F (37.6 C)] 98 F (36.7 C) (11/14 0800) Pulse Rate:  [58-74] 71  (11/14 0600) Resp:  [10-37] 14  (11/14 0600) BP: (125-172)/(46-90) 138/51 mmHg (11/14 0500) SpO2:  [100 %] 100 % (11/14 0600) Last BM Date: 08/26/11  Heart: RRR Chest: Clear in front Abdomen: obese , soft,   Extremities: no edema, scars on legs Neuro/Psych:  Alert, pleasant, appropriate  Intake/Output from previous day: 11/13 0701 - 11/14 0700 In: 2925 [P.O.:2425; I.V.:450; IV Piggyback:50] Out: 502 [Urine:500; Stool:2]  Intake/Output this shift:    Lab Results:  Basename 08/27/11 0600 08/26/11 0635 08/25/11 0455  WBC 6.4 8.4 13.5*  HGB 9.3* 8.8* 9.3*  HCT 27.4* 26.1* 27.0*  PLT 30* 129* 133*   BMET  Basename 08/27/11 0600 08/26/11 0635 08/25/11 0455  NA 139 140 147*  K 4.0 3.4* 3.7  CL 110 109 116*  CO2 20 23 21  GLUCOSE 152* 283* 199*  BUN 13 27* 41*  CREATININE 1.03 1.34* 1.26*  CALCIUM 8.6 8.6 9.2   LFT No results found for this basename: PROT,ALBUMIN,AST,ALT,ALKPHOS,BILITOT,BILIDIR,IBILI in the last 72 hours PT/INR  Basename 08/25/11 0455 08/24/11 2030  LABPROT 15.3* 16.5*  INR 1.18 1.31       ASSESMENT: 1. GI bleed with anemia. Erosive gastritis on EGD does not fully explain.  Colonoscopy planned but rescheduled because of poor prep.  See new orders. On bid Protonix. Hgb better 2. Chronic coumadin on hold.  No lovenox in use. 3.  DM   PLAN: 1. As above.   LOS: 4 days   Vung Kush  08/27/2011, 9:32 AM  

## 2011-08-28 ENCOUNTER — Encounter (HOSPITAL_COMMUNITY): Payer: Self-pay | Admitting: *Deleted

## 2011-08-28 ENCOUNTER — Encounter (HOSPITAL_COMMUNITY)
Admission: EM | Disposition: A | Discharge status: Home / Self Care | Payer: Self-pay | Source: Home / Self Care | Attending: Internal Medicine

## 2011-08-28 HISTORY — PX: COLONOSCOPY: SHX5424

## 2011-08-28 LAB — GLUCOSE, CAPILLARY
Glucose-Capillary: 301 mg/dL — ABNORMAL HIGH (ref 70–99)
Glucose-Capillary: 229 mg/dL — ABNORMAL HIGH (ref 70–99)

## 2011-08-28 LAB — CBC
Hemoglobin: 9.2 g/dL — ABNORMAL LOW (ref 12.0–15.0)
MCH: 28 pg (ref 26.0–34.0)
Platelets: 144 10*3/uL — ABNORMAL LOW (ref 150–400)
RBC: 3.28 MIL/uL — ABNORMAL LOW (ref 3.87–5.11)
WBC: 7.3 10*3/uL (ref 4.0–10.5)

## 2011-08-28 LAB — TYPE AND SCREEN
Unit division: 0
Unit division: 0
Unit division: 0

## 2011-08-28 LAB — BASIC METABOLIC PANEL
CO2: 22 mEq/L (ref 19–32)
Calcium: 8.3 mg/dL — ABNORMAL LOW (ref 8.4–10.5)
Glucose, Bld: 249 mg/dL — ABNORMAL HIGH (ref 70–99)
Potassium: 3.5 mEq/L (ref 3.5–5.1)
Sodium: 138 mEq/L (ref 135–145)

## 2011-08-28 SURGERY — COLONOSCOPY
Anesthesia: Moderate Sedation

## 2011-08-28 MED ORDER — SODIUM CHLORIDE 0.9 % IV SOLN: INTRAVENOUS | Status: DC

## 2011-08-28 MED ORDER — INSULIN GLARGINE 100 UNIT/ML ~~LOC~~ SOLN
26.0000 [IU] | Freq: Every day | SUBCUTANEOUS | Status: DC
  Administered 2011-08-28 – 2011-08-29 (×2): 26 [IU] via SUBCUTANEOUS
  Filled 2011-08-28: qty 3

## 2011-08-28 MED ORDER — INSULIN ASPART 100 UNIT/ML ~~LOC~~ SOLN
0.0000 [IU] | Freq: Three times a day (TID) | SUBCUTANEOUS | Status: DC
  Administered 2011-08-28: 5 [IU] via SUBCUTANEOUS
  Administered 2011-08-28: 3 [IU] via SUBCUTANEOUS
  Administered 2011-08-29 (×2): 8 [IU] via SUBCUTANEOUS
  Administered 2011-08-29 – 2011-08-30 (×2): 5 [IU] via SUBCUTANEOUS

## 2011-08-28 MED ORDER — FERROUS SULFATE 325 (65 FE) MG PO TABS
325.0000 mg | ORAL_TABLET | Freq: Two times a day (BID) | ORAL | Status: DC
  Administered 2011-08-28 – 2011-08-30 (×4): 325 mg via ORAL
  Filled 2011-08-28 (×7): qty 1

## 2011-08-28 MED ORDER — FENTANYL CITRATE 0.05 MG/ML IJ SOLN
INTRAMUSCULAR | Status: AC
  Filled 2011-08-28: qty 2

## 2011-08-28 MED ORDER — PANTOPRAZOLE SODIUM 40 MG PO TBEC
40.0000 mg | DELAYED_RELEASE_TABLET | Freq: Every day | ORAL | Status: DC
  Administered 2011-08-29 – 2011-08-30 (×2): 40 mg via ORAL
  Filled 2011-08-28 (×2): qty 1

## 2011-08-28 MED ORDER — MIDAZOLAM HCL 10 MG/2ML IJ SOLN
INTRAMUSCULAR | Status: AC
  Filled 2011-08-28: qty 2

## 2011-08-28 MED ORDER — MIDAZOLAM HCL 5 MG/5ML IJ SOLN
INTRAMUSCULAR | Status: DC | PRN
  Administered 2011-08-28: 1 mg via INTRAVENOUS
  Administered 2011-08-28: 2 mg via INTRAVENOUS

## 2011-08-28 MED ORDER — FENTANYL CITRATE 0.05 MG/ML IJ SOLN
INTRAMUSCULAR | Status: DC | PRN
  Administered 2011-08-28: 25 ug via INTRAVENOUS

## 2011-08-28 MED ORDER — INSULIN ASPART 100 UNIT/ML ~~LOC~~ SOLN
6.0000 [IU] | Freq: Three times a day (TID) | SUBCUTANEOUS | Status: DC
  Administered 2011-08-28 – 2011-08-30 (×8): 6 [IU] via SUBCUTANEOUS

## 2011-08-28 MED ORDER — WARFARIN SODIUM 7.5 MG PO TABS
7.5000 mg | ORAL_TABLET | Freq: Once | ORAL | Status: AC
  Administered 2011-08-28: 7.5 mg via ORAL
  Filled 2011-08-28: qty 1

## 2011-08-28 NOTE — Plan of Care (Signed)
Problem: Consults Goal: Diabetes Guidelines if Diabetic/Glucose > 140 If diabetic or lab glucose is > 140 mg/dl - Initiate Diabetes/Hyperglycemia Guidelines & Document Interventions  Outcome: Progressing Recommendations made.  Will continue to monitor.     

## 2011-08-28 NOTE — Progress Notes (Addendum)
Please see hyperlink under procedures to see full report.   ENDOSCOPIC IMPRESSION:  1) Normal colonoscopy   2) given this, suspect that she bled from the gastric erosions in the setting of a supratherapeutic INR.  RECOMMENDATIONS:  1)resume warfarin now, avoid supratherapeutic INR   2)She were to have more bleeding, would then likely perform a capsule endoscopy of the small intestine   3) iron supplementation as appropriate  Does not need GI follow-up except as needed  Signing off   Note that when she was admitted she was on remained in the aspirin and warfarin. This is inadequate ulcer prophylaxis. A PPI is necessary for chronic salicylates and even though was only 81 mg aspirin since she also took warfarin she should be a necrotic daily PPI. I do not think she needs a twice a day PPI at discharge. We'll change to daily now.  She really needs 81 mg aspirin in addition to warfarin, then restart that aspirin in 2-4 weeks after discharge.

## 2011-08-28 NOTE — Progress Notes (Signed)
ANTICOAGULATION CONSULT NOTE - Initial Consult  Pharmacy Consult for Coumadin  Indication: stroke  Allergies  Allergen Reactions  . Baking Soda-Fluoride (Sodium Fluoride) Nausea And Vomiting  . Milk Of Magnesia Nausea And Vomiting    Patient Measurements: Height: 5\' 5"  (165.1 cm) Weight: 156 lb 8.4 oz (71 kg) IBW/kg (Calculated) : 57  Adjusted Body Weight:   Vital Signs: Temp: 98.2 F (36.8 C) (11/15 1232) Temp src: Oral (11/15 1232) BP: 139/42 mmHg (11/15 1155) Pulse Rate: 88  (11/15 0902)  Labs:  Basename 08/28/11 0505 08/27/11 0600 08/26/11 0635  HGB 9.2* 9.3* --  HCT 27.9* 27.4* 26.1*  PLT 144* 30* 129*  APTT -- -- --  LABPROT -- -- --  INR -- -- --  HEPARINUNFRC -- -- --  CREATININE 1.02 1.03 1.34*  CKTOTAL -- -- --  CKMB -- -- --  TROPONINI -- -- --   Estimated Creatinine Clearance: 46.4 ml/min (by C-G formula based on Cr of 1.02).  Medical History: Past Medical History  Diagnosis Date  . CAD (coronary artery disease)     Pt reports MI in 2006 (no documentation).  Cardiolite in 05/2002 and 07/2006 did not reveal any reversible ischemia.  Pt follows with Dr. Clarene [facility] at [facility].  . CHF (congestive heart failure)     EF 25-30% with dilated LV, mild LVH, severe hypokinesis, and mod-severe reduction in RV function  . Osteoporosis   . DIABETES MELLITUS, TYPE II 11/04/1983  . HYPERTENSION 08/03/2006  . GASTROPARESIS, DIABETIC 08/03/2006  . HYPERLIPIDEMIA 08/03/2006  . OBSTRUCTIVE SLEEP APNEA 01/06/2008  . PERIPHERAL NEUROPATHY 08/03/2006  . GERD 08/03/2006  . LOW BACK PAIN, CHRONIC 08/03/2006  . OSTEOPOROSIS 03/21/2009  . CEREBRAL EMBOLISM, WITH INFARCTION 07/02/2010    Medications:  Prescriptions prior to admission  Medication Sig Dispense Refill  . albuterol (PROVENTIL HFA) 108 (90 BASE) MCG/ACT inhaler Inhale 2 puffs into the lungs every 4 (four) hours as needed for wheezing or shortness of breath. For wheezing or shortness of breath.  18 g  11  . alendronate  (FOSAMAX) 35 MG tablet Take 35 mg by mouth every 7 (seven) days. Take in the morning with a full glass of water, on an empty stomach, and do not take anything else by mouth or lie down for the next 30 min.       Marland Kitchen amLODipine (NORVASC) 10 MG tablet TAKE ONE (1) TABLET EACH DAY                                                  Generic for NORVASC  30 tablet  11  . aspirin 81 MG EC tablet Take 81 mg by mouth daily.        . Blood Glucose Monitoring Suppl (PRODIGY AUTOCODE BLOOD GLUCOSE) W/DEVICE KIT Use to test blood sugar 3 times a day or as instructed.       . calcium-vitamin D (OSCAL WITH D 500-200) 500-200 MG-UNIT per tablet Take 1 tablet by mouth 3 (three) times daily.  90 tablet  6  . carvedilol (COREG) 12.5 MG tablet Take 12.5 mg by mouth 2 (two) times daily with meals.        . cloNIDine (CATAPRES) 0.1 MG/24HR Place 1 patch onto the skin once a week.        . flunisolide (NASALIDE) 0.025 % SOLN Two sprays per  nostril two times a day      . fluticasone (FLONASE) 50 MCG/ACT nasal spray Place 1 spray into the nose daily.  16 g  2  . glucose blood (PRODIGY NO CODING BLOOD GLUC) test strip Use to check your blood sugar 3 times a day  100 each  12  . insulin glargine (LANTUS) 100 UNIT/ML injection Inject 26 Units into the skin at bedtime.  10 mL  3  . Insulin Syringe-Needle U-100 (INSULIN SYRINGE .5CC/31GX5/16") 31G X 5/16" 0.5 ML MISC Use to inject insulin 3 times daily as instructed.       . Insulin Syringes, Disposable, U-100 0.3 ML MISC Use to inject insulin  1 each  6  . isosorbide mononitrate (IMDUR) 60 MG 24 hr tablet Take 60 mg by mouth daily.        . Lancets MISC Use to test blood sugar 3 times a day or as instructed.       Marland Kitchen lisinopril (PRINIVIL,ZESTRIL) 40 MG tablet Take 40 mg by mouth daily.        Marland Kitchen loratadine (CLARITIN) 10 MG tablet Take 10 mg by mouth daily.        . nitroGLYCERIN (NITROSTAT) 0.4 MG SL tablet Place 0.4 mg under the tongue every 5 (five) minutes as needed.        .  ranitidine (ZANTAC) 75 MG tablet Take 75 mg by mouth daily as needed.        . rosuvastatin (CRESTOR) 20 MG tablet Take 1 tablet (20 mg total) by mouth daily.  30 tablet  6  . salmeterol (SEREVENT) 50 MCG/DOSE diskus inhaler Inhale 1 puff into the lungs daily.        Marland Kitchen senna (SENOKOT) 8.6 MG tablet Take 1-2 tablets by mouth daily.  60 tablet  6  . warfarin (COUMADIN) 5 MG tablet Take as directed by Dr Alexandria Lodge  62 tablet  3  . zolpidem (AMBIEN) 5 MG tablet TAKE ONE TABLET AT BEDTIME                                                  Generic for AMBIEN 5  30 tablet  3   Scheduled:    . antiseptic oral rinse  15 mL Mouth Rinse q12n4p  . carvedilol  12.5 mg Oral BID WC  . ferrous sulfate  325 mg Oral BID WC  . insulin aspart  0-15 Units Subcutaneous TID WC  . insulin aspart  0-5 Units Subcutaneous QHS  . insulin aspart  6 Units Subcutaneous TID WC  . pantoprazole  40 mg Oral QAC breakfast  . polyethylene glycol-electrolytes  2,000 mL Oral Once  . polyethylene glycol-electrolytes  2,000 mL Oral Once  . rosuvastatin  20 mg Oral q1800  . salmeterol  1 puff Inhalation Daily  . DISCONTD: insulin glargine  13 Units Subcutaneous Daily  . DISCONTD: pantoprazole  40 mg Oral BID AC  . DISCONTD: polyethylene glycol-electrolytes  2,000 mL Oral Once    Assessment: 77 yo who was admitted for GIB. Coumadin was held on admission and she was given a 1mg  IV vit k. She just got scope. GI suspected that the blood loss is from gastric erosion. Ok to resume coumadin today per GI. Goal of Therapy:  INR 2-3   Plan:  1. Coumadin 7.5mg  x1 2. Daily PT/INR  Ulyses [place] Duke 08/28/2011,12:32 PM

## 2011-08-28 NOTE — Progress Notes (Signed)
Inpatient Diabetes Program Recommendations  AACE/ADA: New Consensus Statement on Inpatient Glycemic Control (2009)  Target Ranges:  Prepandial:   less than 140 mg/dL      Peak postprandial:   less than 180 mg/dL (1-2 hours)      Critically ill patients:  140 - 180 mg/dL   Reason for Visit: Elevated fasting glucose:  301 mg/dL  Inpatient Diabetes Program Recommendations Insulin - Basal: Increase to home Lantus dose:  26 units daily HgbA1C: Hgb A1C: 8.8%

## 2011-08-28 NOTE — Interval H&P Note (Signed)
History and Physical Interval Note:   08/28/2011   10:25 AM   [name]  has presented today for surgery, with the diagnosis of gi bleed  The various methods of treatment have been discussed with the patient and family. After consideration of risks, benefits and other options for treatment, the patient has consented to  Procedure(s): COLONOSCOPY as a surgical intervention .  The patients' history has been reviewed, patient examined, no change in status, stable for surgery.  I have reviewed the patients' chart and labs.  Questions were answered to the patient's satisfaction.     Stan Head  MD

## 2011-08-28 NOTE — Progress Notes (Signed)
Internal Medicine Attending  Date: 08/28/2011  Patient name: [name] Medical record number: [id number] Date of birth: Jan 01, 1933 Age: 77 y.o. Gender: female  I saw and evaluated the patient. I reviewed the resident's note by Dr. Tonny Branch and I agree with the resident's findings and plans as documented in his note, with additional comments as follows.  Warfarin was apparently started in the setting of stroke following cardiac catheterization in 2011, with CT finding showing 2 lesions in different vascular distributions raising suspicion of embolic stroke.  The risk of resuming warfarin is possible recurrent GI bleed; the risk of stopping warfarin is possible recurrent stroke.  Patient has done well since hospitalization without evidence of acute GI bleeding, and her hemoglobin has been stable following transfusion.  Would discuss risk-benefit with patient and family; if warfarin is continued long-term for stroke prophylaxis, would target an INR in the low therapeutic range (between 2 and 2.5), and also follow very closely for any signs or symptoms of recurrent GI bleeding.

## 2011-08-28 NOTE — Plan of Care (Signed)
Problem: Phase I Progression Outcomes Goal: Initial discharge plan identified Outcome: Completed/Met Date Met:  08/28/11 Return home

## 2011-08-28 NOTE — H&P (View-Only) (Signed)
     [facility] Daily Rounding Note 08/27/2011, 9:32 AM  SUBJECTIVE: Pt not adequately prepped , colonoscopy rescheduled for tomorrow. No blood seen in stool OBJECTIVE:  General: Obese, chronically ill.  Vital signs in last 24 hours: Temp:  [98 F (36.7 C)-99.7 F (37.6 C)] 98 F (36.7 C) (11/14 0800) Pulse Rate:  [58-74] 71  (11/14 0600) Resp:  [10-37] 14  (11/14 0600) BP: (125-172)/(46-90) 138/51 mmHg (11/14 0500) SpO2:  [100 %] 100 % (11/14 0600) Last BM Date: 08/26/11  Heart: RRR Chest: Clear in front Abdomen: obese , soft,   Extremities: no edema, scars on legs Neuro/Psych:  Alert, pleasant, appropriate  Intake/Output from previous day: 11/13 0701 - 11/14 0700 In: 2925 [P.O.:2425; I.V.:450; IV Piggyback:50] Out: 502 [Urine:500; Stool:2]  Intake/Output this shift:    Lab Results:  Basename 08/27/11 0600 08/26/11 0635 08/25/11 0455  WBC 6.4 8.4 13.5*  HGB 9.3* 8.8* 9.3*  HCT 27.4* 26.1* 27.0*  PLT 30* 129* 133*   BMET  Basename 08/27/11 0600 08/26/11 0635 08/25/11 0455  NA 139 140 147*  K 4.0 3.4* 3.7  CL 110 109 116*  CO2 20 23 21   GLUCOSE 152* 283* 199*  BUN 13 27* 41*  CREATININE 1.03 1.34* 1.26*  CALCIUM 8.6 8.6 9.2   LFT No results found for this basename: PROT,ALBUMIN,AST,ALT,ALKPHOS,BILITOT,BILIDIR,IBILI in the last 72 hours PT/INR  Basename 08/25/11 0455 08/24/11 2030  LABPROT 15.3* 16.5*  INR 1.18 1.31       ASSESMENT: 1. GI bleed with anemia. Erosive gastritis on EGD does not fully explain.  Colonoscopy planned but rescheduled because of poor prep.  See new orders. On bid Protonix. Hgb better 2. Chronic coumadin on hold.  No lovenox in use. 3.  DM   PLAN: 1. As above.   LOS: 4 days   Jennye Moccasin  08/27/2011, 9:32 AM

## 2011-08-28 NOTE — Progress Notes (Signed)
Subjective: Doing well after colonoscopy.  Feeling well.  Denies pain, fever, chills, or nausea. No dizziness on standing or sitting.   Objective: Vital signs in last 24 hours: Filed Vitals:   08/28/11 1103 08/28/11 1113 08/28/11 1155 08/28/11 1232  BP: 111/50 119/83 139/42   Pulse:      Temp:    98.2 F (36.8 C)  TempSrc:    Oral  Resp: 22 18 20    Height:      Weight:      SpO2: 100% 100% 100%    Weight change:   Intake/Output Summary (Last 24 hours) at 08/28/11 1408 Last data filed at 08/28/11 1100  Gross per 24 hour  Intake   1330 ml  Output   1453 ml  Net   -123 ml   Physical Exam: Vitals reviewed. General: resting in bed, NAD, just finished lunch. HEENT: PERRL, EOMI, no scleral icterus Cardiac: RRR, no rubs, murmurs or gallops Pulm: clear to auscultation bilaterally, no wheezes, rales, or rhonchi Abd: soft, nontender, nondistended, BS present Ext: warm and well perfused, no pedal edema Neuro: alert and oriented X3, cranial nerves II-XII grossly intact, strength and sensation to light touch equal in bilateral upper and lower extremities  Lab Results: Basic Metabolic Panel:  Lab 08/28/11 5621 08/27/11 0600  NA 138 139  K 3.5 4.0  CL 106 110  CO2 22 20  GLUCOSE 249* 152*  BUN 8 13  CREATININE 1.02 1.03  CALCIUM 8.3* 8.6  MG -- --  PHOS -- --   CBC:  Lab 08/28/11 0505 08/27/11 0600  WBC 7.3 6.4  NEUTROABS -- --  HGB 9.2* 9.3*  HCT 27.9* 27.4*  MCV 85.1 84.0  PLT 144* 30*   CBG:  Lab 08/28/11 1232 08/28/11 0748 08/27/11 2119 08/27/11 1545 08/27/11 1108 08/27/11 0846  GLUCAP 198* 301* 251* 221* 193* 185*   Hemoglobin A1C:  Lab 08/26/11 0635  HGBA1C 8.8*   Coagulation:  Lab 08/25/11 0455 08/24/11 2030 08/24/11 1139 08/24/11 0605  LABPROT 15.3* 16.5* 19.6* 25.0*  INR 1.18 1.31 1.63* 2.22*   Micro Results: Recent Results (from the past 240 hour(s))  MRSA PCR SCREENING     Status: Normal   Collection Time   08/24/11  9:20 AM      Component  Value Range Status Comment   MRSA by PCR NEGATIVE  NEGATIVE  Final    Studies/Results: Colonoscopy: normal colonoscopy EGD: Erosive Gastritis with multiple superficial erosions in antrum.   Medications: I have reviewed the patient's current medications. Scheduled Meds:   . antiseptic oral rinse  15 mL Mouth Rinse q12n4p  . carvedilol  12.5 mg Oral BID WC  . ferrous sulfate  325 mg Oral BID WC  . insulin aspart  0-15 Units Subcutaneous TID WC  . insulin aspart  0-5 Units Subcutaneous QHS  . insulin aspart  6 Units Subcutaneous TID WC  . insulin glargine  26 Units Subcutaneous QHS  . pantoprazole  40 mg Oral QAC breakfast  . polyethylene glycol-electrolytes  2,000 mL Oral Once  . polyethylene glycol-electrolytes  2,000 mL Oral Once  . rosuvastatin  20 mg Oral q1800  . salmeterol  1 puff Inhalation Daily  . warfarin  7.5 mg Oral ONCE-1800  . DISCONTD: insulin aspart  0-15 Units Subcutaneous TID WC  . DISCONTD: insulin aspart  6 Units Subcutaneous TID WC  . DISCONTD: insulin glargine  13 Units Subcutaneous Daily  . DISCONTD: pantoprazole  40 mg Oral BID AC  .  DISCONTD: polyethylene glycol-electrolytes  2,000 mL Oral Once   Continuous Infusions:   . sodium chloride 20 mL/hr at 08/28/11 0933  . sodium chloride     PRN Meds:.albuterol, fentaNYL, midazolam, nitroGLYCERIN, zolpidem Assessment/Plan: 1) GI bleeding and acute poshemorrhagic anemia- FOBT positive with recent drop in hemoglobin to 4.9, supratherapeutic INR, and history of melena and weakness consistent with acute GI bleed. status post 3 units packed red blood cells.  Hemoglobin stable at 9.3. Patient has baseline anemia with hemoglobin approximately 10. Goal hemoglobin greater than 8. EGD showed erosive gastritis with superficial ulcers in the antrum.  Colonoscopy done today that was completely normal.  GI's thought is that the bleed was secondary to the gastritis and a supratherapeutic INR.   -- Continue Protonix 40 daily per  the GI recommendations. -- Plan to restart coumadin but will need to discuss with Dr. Clarene Duke the indications to continue it.    2) DIABETES MELLITUS, TYPE II  A1c 8.8 which is improved from previous.  With her previous stroke we will need to continue to be aggressive in lowering her A1C to decrease her risk factor for strokes. -- Restarted her Lantus at home dose of 26 U qhs and SSI and will continue to monitor.  3) ACUTE ON CHRONIC renal insufficiency RESOLVED- baseline creatinine 1.2-1.3. Was elevated to 1.4 on admission - currently 1.03.   4) CEREBRAL EMBOLISM, WITH INFARCTION - Supratherapeutic INR on admission.  Was on warfarin secondary to cerebral embolism following her Cath.  After reviewing the records she was thought to be at risk of embolic stroke because of her low EF.  The echo done in 05/2010 was done in the setting of acute CHF exacerbation which could artificially reduce the EF. Since she is stable from a heart failure stand point we will recheck the 2D echo and consider if she needs to be restarted on Coumadin.  There is definitely a risk of stroke if the EF continues to be low but I'm unsure without the echo that the risk outweighs the benefit of bleeding from coumadin.    5) Hypertension- Outpatient regimen held at admission secondary to soft blood pressure. We'll continue to hold ACE given acute bump in creatinine. beta blocker restarted and will add back isosorbide mononitrate and amlodipine as blood pressure allows.  -- Carvedilol 12.5 twice a day   6) Stable chronic CONGESTIVE HEART FAILURE - no evidence of pulmonary edema on exam. O2 sats greater than 98% on room air. We'll continue to monitor given volume repletion.  -- carvedilol per above  -- Repeat 2D echo to find stable EF.  7) CAD (coronary artery disease) - ASA held in the setting of acute GI bleed. ACE held given acute renal injury. Will had back beta blocker. Will add back nitrate if blood pressure supports  --  Rosuvastatin 20 mg daily  -- will need ASA after D/C but will need gastric protection with the gastritis on EGD.  8) DVT proph: SCDs given acute bleed   LOS: 5 days   Everhett Bozard 08/28/2011, 2:08 PM

## 2011-08-29 DIAGNOSIS — I509 Heart failure, unspecified: Secondary | ICD-10-CM

## 2011-08-29 DIAGNOSIS — R7309 Other abnormal glucose: Secondary | ICD-10-CM

## 2011-08-29 DIAGNOSIS — K922 Gastrointestinal hemorrhage, unspecified: Secondary | ICD-10-CM

## 2011-08-29 HISTORY — PX: TRANSTHORACIC ECHOCARDIOGRAM: SHX275

## 2011-08-29 LAB — CBC
Hemoglobin: 8.8 g/dL — ABNORMAL LOW (ref 12.0–15.0)
MCH: 27.4 pg (ref 26.0–34.0)
MCHC: 32.2 g/dL (ref 30.0–36.0)
MCV: 85 fL (ref 78.0–100.0)
Platelets: 141 10*3/uL — ABNORMAL LOW (ref 150–400)

## 2011-08-29 LAB — GLUCOSE, CAPILLARY
Glucose-Capillary: 263 mg/dL — ABNORMAL HIGH (ref 70–99)
Glucose-Capillary: 212 mg/dL — ABNORMAL HIGH (ref 70–99)

## 2011-08-29 LAB — BASIC METABOLIC PANEL
Calcium: 8.6 mg/dL (ref 8.4–10.5)
Creatinine, Ser: 1.05 mg/dL (ref 0.50–1.10)
GFR calc non Af Amer: 50 mL/min — ABNORMAL LOW (ref >90)
Glucose, Bld: 241 mg/dL — ABNORMAL HIGH (ref 70–99)
Sodium: 140 mEq/L (ref 135–145)

## 2011-08-29 LAB — PROTIME-INR: Prothrombin Time: 15.9 seconds — ABNORMAL HIGH (ref 11.6–15.2)

## 2011-08-29 MED ORDER — FERROUS SULFATE 325 (65 FE) MG PO TABS: 325.0000 mg | ORAL_TABLET | Freq: Two times a day (BID) | ORAL | Status: DC

## 2011-08-29 MED ORDER — WARFARIN SODIUM 7.5 MG PO TABS
7.5000 mg | ORAL_TABLET | Freq: Once | ORAL | Status: AC
  Administered 2011-08-29: 7.5 mg via ORAL
  Filled 2011-08-29 (×2): qty 1

## 2011-08-29 MED ORDER — AMLODIPINE BESYLATE 10 MG PO TABS
10.0000 mg | ORAL_TABLET | Freq: Every day | ORAL | Status: DC
  Administered 2011-08-29 – 2011-08-30 (×2): 10 mg via ORAL
  Filled 2011-08-29 (×2): qty 1

## 2011-08-29 MED ORDER — POTASSIUM CHLORIDE CRYS ER 20 MEQ PO TBCR
40.0000 meq | EXTENDED_RELEASE_TABLET | Freq: Every day | ORAL | Status: DC
  Administered 2011-08-29 – 2011-08-30 (×2): 40 meq via ORAL
  Filled 2011-08-29 (×2): qty 2

## 2011-08-29 NOTE — Progress Notes (Signed)
ANTICOAGULATION CONSULT NOTE - Follow Up Consult  Pharmacy Consult for Coumadin Indication: hx CVA - August 2011  Allergies  Allergen Reactions  . Baking Soda-Fluoride (Sodium Fluoride) Nausea And Vomiting  . Milk Of Magnesia Nausea And Vomiting    Patient Measurements: Height: 5\' 3"  (160 cm) Weight: 174 lb 6.1 oz (79.1 kg) IBW/kg (Calculated) : 52.4    Vital Signs: Temp: 98.7 F (37.1 C) (11/16 0500) Temp src: Oral (11/16 0500) BP: 175/63 mmHg (11/16 0500) Pulse Rate: 64  (11/16 0500)  Labs:  Basename 08/29/11 0625 08/28/11 0505 08/27/11 0600  HGB 8.8* 9.2* --  HCT 27.3* 27.9* 27.4*  PLT 141* 144* 30*  APTT -- -- --  LABPROT 15.9* -- --  INR 1.24 -- --  HEPARINUNFRC -- -- --  CREATININE 1.05 1.02 1.03  CKTOTAL -- -- --  CKMB -- -- --  TROPONINI -- -- --   Estimated Creatinine Clearance: 45.4 ml/min (by C-G formula based on Cr of 1.05).   Assessment:   Coumadin just resumed 11/15 after OK per GI after colonscopy.  On Protonix 40 mg PO daily for gastric erosion. Also on Ferrous sulfate 325 mg BID for anemia. No stools yet to recheck, but iron can cause false positive guaiac.    Goal of Therapy:    INR 2-3, but will aim for 2-2.5 with recent GI issues.   Plan:    Repeat Coumadin 7.5 mg today.    Continue daily PT/INR.     Will f/u with you for any further bleeding.   Consider adding stool softener (Docusate 100-200 mg daily.)  Dennie Fetters 08/29/2011,2:35 PM

## 2011-08-29 NOTE — Progress Notes (Signed)
Subjective: Doing well after colonoscopy.  Feeling well.  Denies headache, chest pain, abdominal pain, fever, chills, or nausea. No dizziness or lightheadedness. She has not had a bowel movement since before her colonoscopy yesterday. She did not see any blood in her prep fluid.  Objective: Vital signs in last 24 hours: Filed Vitals:   08/28/11 1609 08/28/11 2000 08/28/11 2100 08/29/11 0500  BP:  147/53 156/54 175/63  Pulse:  72 67 64  Temp: 98.6 F (37 C) 99.1 F (37.3 C) 99.2 F (37.3 C) 98.7 F (37.1 C)  TempSrc: Oral  Oral Oral  Resp:  21 17 17   Height:   5\' 3"  (1.6 m)   Weight:   174 lb 6.1 oz (79.1 kg)   SpO2:  100% 98% 97%   Weight change:   Intake/Output Summary (Last 24 hours) at 08/29/11 1610 Last data filed at 08/28/11 2000  Gross per 24 hour  Intake    690 ml  Output    800 ml  Net   -110 ml   Physical Exam: Vitals reviewed. General: resting in bed, NAD, HEENT: PERRL, EOMI, no scleral icterus no signs of anemia Cardiac: RRR, one of 6 systolic murmur best heard at the right upper sternal border, no gallops Pulm: clear to auscultation bilaterally, no wheezes, rales, or rhonchi Abd: soft, nontender, nondistended, BS present Ext: warm and well perfused, no pedal edema Neuro: alert and oriented X3, cranial nerves II-XII grossly intact,   Lab Results: Basic Metabolic Panel:  Lab 08/28/11 9604 08/27/11 0600  NA 138 139  K 3.5 4.0  CL 106 110  CO2 22 20  GLUCOSE 249* 152*  BUN 8 13  CREATININE 1.02 1.03  CALCIUM 8.3* 8.6  MG -- --  PHOS -- --   CBC:  Lab 08/29/11 0625 08/28/11 0505  WBC 6.5 7.3  NEUTROABS -- --  HGB 8.8* 9.2*  HCT 27.3* 27.9*  MCV 85.0 85.1  PLT 141* 144*     CBG:  Lab 08/29/11 0654 08/28/11 2211 08/28/11 1706 08/28/11 1232 08/28/11 0748 08/27/11 2119  GLUCAP 251* 229* 235* 198* 301* 251*   Hemoglobin A1C:  Lab 08/26/11 0635  HGBA1C 8.8*   Coagulation:  Lab 08/29/11 0625 08/25/11 0455 08/24/11 2030 08/24/11 1139    LABPROT 15.9* 15.3* 16.5* 19.6*  INR 1.24 1.18 1.31 1.63*   Micro Results: Recent Results (from the past 240 hour(s))  MRSA PCR SCREENING     Status: Normal   Collection Time   08/24/11  9:20 AM      Component Value Range Status Comment   MRSA by PCR NEGATIVE  NEGATIVE  Final    Studies/Results: Colonoscopy: normal colonoscopy EGD: Erosive Gastritis with multiple superficial erosions in antrum.   Medications: I have reviewed the patient's current medications. Scheduled Meds:    . carvedilol  12.5 mg Oral BID WC  . ferrous sulfate  325 mg Oral BID WC  . insulin aspart  0-15 Units Subcutaneous TID WC  . insulin aspart  0-5 Units Subcutaneous QHS  . insulin aspart  6 Units Subcutaneous TID WC  . insulin glargine  26 Units Subcutaneous QHS  . pantoprazole  40 mg Oral QAC breakfast  . rosuvastatin  20 mg Oral q1800  . salmeterol  1 puff Inhalation Daily  . warfarin  7.5 mg Oral ONCE-1800  . DISCONTD: antiseptic oral rinse  15 mL Mouth Rinse q12n4p  . DISCONTD: insulin aspart  0-15 Units Subcutaneous TID WC  . DISCONTD: insulin aspart  6 Units Subcutaneous TID WC  . DISCONTD: insulin glargine  13 Units Subcutaneous Daily  . DISCONTD: pantoprazole  40 mg Oral BID AC   Continuous Infusions:    . DISCONTD: sodium chloride 20 mL/hr at 08/28/11 0933  . DISCONTD: sodium chloride     PRN Meds:.albuterol, nitroGLYCERIN, zolpidem, DISCONTD: fentaNYL, DISCONTD: midazolam Assessment/Plan: 1) GI bleeding and acute poshemorrhagic anemia- FOBT positive with recent drop in hemoglobin to 4.9, supratherapeutic INR, and history of melena and weakness consistent with acute GI bleed. status post 3 units packed red blood cells.Goal hemoglobin greater than 8.  Hemoglobin stable for the last 4 days at approximately 8.8. Patient has baseline anemia with hemoglobin approximately 10.  EGD showed erosive gastritis with superficial ulcers in the antrum.  Colonoscopy done today that was completely normal.   GI's thought is that the bleed was secondary to the gastritis and a supratherapeutic INR.   -- Continue Protonix 40 daily per the GI recommendations. -- Plan to restart coumadin but will need to discuss with Dr. Clarene Duke the indications to continue it.    2) DIABETES MELLITUS, TYPE II  A1c 8.8 which is improved from previous.  With her previous stroke we will need to continue to be aggressive in lowering her A1C to decrease her risk factor for strokes. -- Restarted her Lantus at home dose of 26 U qhs and SSI and will continue to monitor.  3) ACUTE ON CHRONIC renal insufficiency RESOLVED- baseline creatinine 1.2-1.3. Was elevated to 1.4 on admission - currently 1.03.   4) CEREBRAL EMBOLISM, WITH INFARCTION - Supratherapeutic INR on admission.  Was on warfarin secondary to cerebral embolism following her Cath.  After reviewing the records she was thought to be at risk of embolic stroke because of her low EF.  The echo done in 05/2010 was done in the setting of acute CHF exacerbation which could artificially reduce the EF. Since she is stable from a heart failure stand point we will recheck the 2D echo and consider if she needs to be restarted on Coumadin.  There is definitely a risk of stroke if the EF continues to be low but I'm unsure without the echo that the risk outweighs the benefit of bleeding from coumadin.    5) Hypertension- Outpatient regimen held at admission secondary to soft blood pressure. We'll continue to hold ACE given acute bump in creatinine. beta blocker restarted and will add back isosorbide mononitrate and amlodipine as blood pressure allows.  -- Carvedilol 12.5 twice a day  -- will Restart amlodipine 10mg  daily, as BP is trending up.  6) Stable chronic CONGESTIVE HEART FAILURE - no evidence of pulmonary edema on exam. O2 sats greater than 98% on room air. We'll continue to monitor given volume repletion.  -- carvedilol per above  -- Repeat 2D echo to find stable EF.  7) CAD  (coronary artery disease) - ASA held in the setting of acute GI bleed. ACE held given acute renal injury. Will currently on back beta blocker. Will add back nitrate if blood pressure supports  -- Rosuvastatin 20 mg daily  -- will need ASA after D/C but will need gastric protection with the gastritis on EGD.  8) DVT proph: SCDs given acute bleed   LOS: 6 days   [name] 08/29/2011, 8:22 AM

## 2011-08-29 NOTE — Progress Notes (Signed)
*  PRELIMINARY RESULTS* Echocardiogram 2D Echocardiogram has been performed.  Glean Salen [facility] RDCS 08/29/2011, 12:35 PM

## 2011-08-29 NOTE — Clinical Documentation Improvement (Signed)
CHF DOCUMENTATION CLARIFICATION QUERY  Please update your documentation within the medical record to reflect your response to this query.                                                                                     08/29/11  Dear Dr.Joines / Associates,  In a better effort to capture your patient's severity of illness, reflect appropriate length of stay and utilization of resources, a review of the patient medical record has revealed the following indicators the diagnosis of Heart Failure.    Based on your clinical judgment, please clarify and document in a progress note and/or discharge summary the clinical condition associated with the following supporting information:  In responding to this query please exercise your independent judgment.  The fact that a query is asked, does not imply that any particular answer is desired or expected.   "CHF" is documented in the progress notes. Please provide the type of CHF in the progress notes. If any of the following conditions helps provide greater specificity for this patient, please document it in the progress note and discharge summary. THANK YOU!  [facility]: The acuity and type of CHF should be documented when known [acute, chronic, acute on chronic, systolic, diastolic, systolic and diastolic].   - Chronic Systolic Congestive Heart Failure  - Chronic Diastolic Congestive Heart Failure  - Other Condition________________________________________  Shanon Rosser Clinically Determine    Additional documentation in chart upon review: Progress notes 11/17    Thank You,  Sincerely, Eldred Manges  Clinical Documentation Specialist Health Information Management [place] [phone] Email: [email or web address]  TO RESPOND TO THE THIS QUERY, FOLLOW THE INSTRUCTIONS BELOW:  1. If needed, update documentation for the patient's encounter via the notes activity.  2. Access this query again and click edit on the  [employer].  3. After updating, or not, click F2 to complete all highlighted (required) fields concerning your review. Select "additional documentation in the medical record" OR "no additional documentation provided". 4. Click Sign note button.  5. The deficiency will fall out of your InBasket  *Please let us know if you are not able to compete this workflow by phone or e-mail (listed above).

## 2011-08-29 NOTE — Progress Notes (Signed)
Internal Medicine Attending  Date: 08/29/2011  Patient name: [name] Medical record number: [id number] Date of birth: September 04, 1933 Age: 77 y.o. Gender: female  I saw and evaluated the patient. I reviewed the resident's note by Dr. Candy Sledge and I agree with the resident's findings and plans as documented in his note.  As previously noted, would target a low therapeutic INR and watch closely for any further signs of GI bleeding; I agree with plan to discuss further with Dr. Clarene Duke following 2-D echocardiogram.  Change of attending: Dr. Josem Kaufmann will cover as attending physician this weekend, and Dr. Blanch Media will take over as attending physician on Monday, November 19.

## 2011-08-30 LAB — GLUCOSE, CAPILLARY
Glucose-Capillary: 88 mg/dL (ref 70–99)
Glucose-Capillary: 215 mg/dL — ABNORMAL HIGH (ref 70–99)

## 2011-08-30 LAB — PROTIME-INR: Prothrombin Time: 16.2 seconds — ABNORMAL HIGH (ref 11.6–15.2)

## 2011-08-30 MED ORDER — INSULIN ASPART 100 UNIT/ML ~~LOC~~ SOLN: 6.0000 [IU] | Freq: Three times a day (TID) | SUBCUTANEOUS | Status: DC

## 2011-08-30 MED ORDER — POLYETHYLENE GLYCOL 3350 17 G PO PACK
17.0000 g | PACK | Freq: Every day | ORAL | Status: DC
  Administered 2011-08-30: 17 g via ORAL
  Filled 2011-08-30: qty 1

## 2011-08-30 MED ORDER — LISINOPRIL 40 MG PO TABS
40.0000 mg | ORAL_TABLET | Freq: Every day | ORAL | Status: DC
  Administered 2011-08-30: 40 mg via ORAL
  Filled 2011-08-30: qty 1

## 2011-08-30 MED ORDER — CLONIDINE HCL 0.1 MG/24HR TD PTWK: 1.0000 | MEDICATED_PATCH | TRANSDERMAL | Status: DC

## 2011-08-30 MED ORDER — ISOSORBIDE MONONITRATE ER 60 MG PO TB24
60.0000 mg | ORAL_TABLET | Freq: Every day | ORAL | Status: DC
  Administered 2011-08-30: 60 mg via ORAL
  Filled 2011-08-30: qty 1

## 2011-08-30 NOTE — Progress Notes (Addendum)
Subjective: States feels chilled this morning but otherwise healthy. Denies dizziness lightheadedness, shortness of breath, chest pain, abdominal pain, blood per rectum. She states she has not had a bowel movement since her colonoscopy prep.  Objective: Vital signs in last 24 hours: Filed Vitals:   08/29/11 0500 08/29/11 1400 08/29/11 2100 08/30/11 0500  BP: 175/63 179/65 146/64 172/74  Pulse: 64 66 72 60  Temp: 98.7 F (37.1 C) 98.3 F (36.8 C) 98.7 F (37.1 C) 98.7 F (37.1 C)  TempSrc: Oral Oral Oral Oral  Resp: 17 17 17 17   Height:      Weight:      SpO2: 97% 96% 98% 100%   Weight change:  No intake or output data in the 24 hours ending 08/30/11 1036 Physical Exam:  General: resting in bed HEENT: PERRL, EOMI, no scleral icterus Cardiac: RRR, no rubs, murmurs or gallops Pulm: clear to auscultation bilaterally, moving normal volumes of air Abd: soft, nontender, nondistended, BS present Ext: warm and well perfused, trace nonpitting pedal edema Neuro: alert and oriented X3, cranial nerves II-XII grossly intact, strength and sensation to light touch equal in bilateral upper and lower extremities  Lab Results: Basic Metabolic Panel:  Lab 08/29/11 1610 08/28/11 0505  NA 140 138  K 3.3* 3.5  CL 108 106  CO2 25 22  GLUCOSE 241* 249*  BUN 9 8  CREATININE 1.05 1.02  CALCIUM 8.6 8.3*  MG -- --  PHOS -- --   Liver Function Tests: CBC:  Lab 08/29/11 0625 08/28/11 0505  WBC 6.5 7.3  NEUTROABS -- --  HGB 8.8* 9.2*  HCT 27.3* 27.9*  MCV 85.0 85.1  PLT 141* 144*   CBG:  Lab 08/30/11 0628 08/29/11 2226 08/29/11 1645 08/29/11 1131 08/29/11 0654 08/28/11 2211  GLUCAP 88 154* 212* 263* 251* 229*   Hemoglobin A1C:  Lab 08/26/11 0635  HGBA1C 8.8*   Coagulation:  Lab 08/30/11 0630 08/29/11 0625 08/25/11 0455 08/24/11 2030  LABPROT 16.2* 15.9* 15.3* 16.5*  INR 1.27 1.24 1.18 1.31    Micro Results: Recent Results (from the past 240 hour(s))  MRSA PCR SCREENING      Status: Normal   Collection Time   08/24/11  9:20 AM      Component Value Range Status Comment   MRSA by PCR NEGATIVE  NEGATIVE  Final    Transthoracic Echocardiography  - Left ventricle: The cavity size was normal. Wall thickness was increased in a pattern of moderate LVH. Systolic function was normal. The estimated ejection fraction was in the range of 55% to 60%. Wall motion was normal; there were no regional wall motion abnormalities. Doppler parameters are consistent with abnormal left ventricular relaxation (grade 1 diastolic dysfunction). The E/e' ratio is >10, suggesting elevated LV filling pressure. - Mitral valve: Mildly thickened leaflets . Mild regurgitation. - Left atrium: The atrium was mildly dilated. - Tricuspid valve: Mild regurgitation. - Pulmonary arteries: PA peak pressure: 34mm Hg (S). - Inferior vena cava: The vessel was normal in size; the respirophasic diameter changes were in the normal range (= 50%); findings are consistent with normal central venous pressure. - Pericardium, extracardiac: Small, circumferential (mostly posterior), pericardial effusion. no signs of tamponade physiology.   Medications: I have reviewed the patient's current medications. Scheduled Meds:   . amLODipine  10 mg Oral Daily  . carvedilol  12.5 mg Oral BID WC  . ferrous sulfate  325 mg Oral BID WC  . insulin aspart  0-15 Units Subcutaneous TID WC  .  insulin aspart  0-5 Units Subcutaneous QHS  . insulin aspart  6 Units Subcutaneous TID WC  . insulin glargine  26 Units Subcutaneous QHS  . isosorbide mononitrate  60 mg Oral Daily  . lisinopril  40 mg Oral Daily  . pantoprazole  40 mg Oral QAC breakfast  . potassium chloride  40 mEq Oral Daily  . rosuvastatin  20 mg Oral q1800  . salmeterol  1 puff Inhalation Daily  . warfarin  7.5 mg Oral ONCE-1800   Continuous Infusions:  PRN Meds:.albuterol, nitroGLYCERIN, zolpidem Assessment/Plan: 77 year old female with poorly controlled  diabetes, anemia, chronic renal insufficiency, history of cerebral embolism on chronic Warfarin therapy with supra-therapeutic INR level admitted with hemoglobin equal 4.7.   1) GI bleeding and acute poshemorrhagic anemia secondary to gastritis- FOBT positive with recent drop in hemoglobin to 4.9, supratherapeutic INR, and history of melena and weakness consistent with acute GI bleed. status post 3 units packed red blood cells.Goal hemoglobin greater than 8. Hemoglobin stable for the last 4 days at approximately 8.8. Patient has baseline anemia with hemoglobin approximately 10. EGD showed erosive gastritis with superficial ulcers in the antrum. Colonoscopy was completely normal. GI's thought is that the bleed was secondary to the gastritis and a supratherapeutic INR.  -- Continue Protonix 40 daily per the GI recommendations.  -- Discharge home today  2) DIABETES MELLITUS, TYPE II A1c 8.8 which is improved from previous. With her previous stroke we will need to continue to be aggressive in lowering her A1C to decrease her risk factor for strokes.  -- Restarted her Lantus at home dose of 26 U qhs and SSI and will continue to monitor.  -- Her total dose of Lantus is limited secondary to early morning hypoglycemia. We will start her on meal coverage with short acting insulin 6 units before meals.  3) CEREBRAL EMBOLISM, WITH INFARCTION - Ms. Jeanlouis had a supratherapeutic INR on admission. She was on warfarin secondary to cerebral embolism following her Cath. After reviewing the records she was thought to be at risk of embolic stroke because of her low EF. The echo done in 05/2010 was done in the setting of acute CHF exacerbation which could artificially reduce the EF. Repeat 2-D echo yesterday showed EF of 55-60%. Therefore the indication for her to be on Coumadin as well no longer present. She likely needs some anticoagulation though this would be better served with an antiplatelet agents such as aspirin 81 mg  daily. Though he should be placed on her other risk factors such as hypertension, cholesterol, and diabetes control. --Restart aspirin  4) Stable chronic diastolic CONGESTIVE HEART FAILURE - no evidence of pulmonary edema on exam. O2 sats greater than 98% on room air. Ms. Yorio echo showed normal EF of 55-60% with mild diastolic dysfunction. -- carvedilol per above  -- Restart ACE inhibitor -- Continue statin  5) Hypertension- blood pressure is elevated this morning and overnight. Full outpatient regimen held at admission secondary to soft blood pressure. ACE inhibitor initially held due to  acute bump in creatinine, now resolved.  Beta blocker and amlodipine were restarted restarted and  today wewill add back isosorbide mononitrate and  lisinopril. We'll continue to hold clonidine patch to not bottom out blood pressure -- Carvedilol 12.5  milligramstwice a day  -- Amlodipine 10 mg daily,  -- Lisinopril 40 mg daily -- Isosorbide mononitrate 60 mg daily  6) CAD (coronary artery disease) - ASA held in the setting of acute GI bleed.  ACE held given acute renal injury. Will currently on back beta blocker. Will add back nitrate if blood pressure supports  -- Rosuvastatin 20 mg daily  -- will restart enteric-coated aspirin today -- Continue beta blocker ACE inhibitor and nitrate as above   7)  ACUTE ON CHRONIC renal insufficiency RESOLVED- baseline creatinine 1.2-1.3. Was elevated to 1.4 on admission - currently 1.03.   8) DVT proph: SCDs given acute bleed     LOS: 7 days   Rollan Roger 08/30/2011, 10:36 AM

## 2011-08-30 NOTE — Discharge Summary (Signed)
Internal Medicine Teaching [facility] Discharge Note  Name: [name] MRN: [id number] DOB: [birth year] 77 y.o.  Date of Admission: 08/23/2011 10:21 PM Date of Discharge: 08/30/2011 Attending Physician: Doneen Poisson  Discharge Diagnosis: Acute GI bleed Acute posthemorrhagic anemia ANEMIA chronic Supratherapeutic INR  History of  CEREBRAL EMBOLISM, WITH INFARCTION Gastritis GERD DIABETES MELLITUS, TYPE II Chronic diastolic congestive heart failure  CAD (coronary artery disease) HYPERLIPIDEMIA HYPERTENSION Acute renal failure  CRI (chronic renal insufficiency)   Discharge Medications: Current Discharge Medication List    START taking these medications   Details  ferrous sulfate 325 (65 FE) MG tablet Take 1 tablet (325 mg total) by mouth 2 (two) times daily with a meal. Qty: 60 tablet, Refills: 3    insulin aspart (NOVOLOG) 100 UNIT/ML injection Inject 6 Units into the skin 3 (three) times daily with meals. Qty: 1 vial, Refills: 0      CONTINUE these medications which have CHANGED   Details  cloNIDine (CATAPRES - DOSED IN MG/24 HR) 0.1 mg/24hr patch Place 1 patch (0.1 mg total) onto the skin once a week. Please begin taking this again if your blood pressure is greater than 150 systolic or 100 diastolic Qty: 4 patch, Refills: 0      CONTINUE these medications which have NOT CHANGED   Details  albuterol (PROVENTIL HFA) 108 (90 BASE) MCG/ACT inhaler Inhale 2 puffs into the lungs every 4 (four) hours as needed for wheezing or shortness of breath. For wheezing or shortness of breath. Qty: 18 g, Refills: 11    alendronate (FOSAMAX) 35 MG tablet Take 35 mg by mouth every 7 (seven) days. Take in the morning with a full glass of water, on an empty stomach, and do not take anything else by mouth or lie down for the next 30 min.     amLODipine (NORVASC) 10 MG tablet TAKE ONE (1) TABLET EACH DAY                                                  Generic for NORVASC Qty:  30 tablet, Refills: 11    aspirin 81 MG EC tablet Take 81 mg by mouth daily.      Blood Glucose Monitoring Suppl (PRODIGY AUTOCODE BLOOD GLUCOSE) W/DEVICE KIT Use to test blood sugar 3 times a day or as instructed.     calcium-vitamin D (OSCAL WITH D 500-200) 500-200 MG-UNIT per tablet Take 1 tablet by mouth 3 (three) times daily. Qty: 90 tablet, Refills: 6   Associated Diagnoses: Osteoporosis    carvedilol (COREG) 12.5 MG tablet Take 12.5 mg by mouth 2 (two) times daily with meals.      flunisolide (NASALIDE) 0.025 % SOLN Two sprays per nostril two times a day    fluticasone (FLONASE) 50 MCG/ACT nasal spray Place 1 spray into the nose daily. Qty: 16 g, Refills: 2   Associated Diagnoses: Acute sinusitis with symptoms > 10 days; Headache; Type II or unspecified type diabetes mellitus without mention of complication, not stated as uncontrolled; Dysuria    glucose blood (PRODIGY NO CODING BLOOD GLUC) test strip Use to check your blood sugar 3 times a day Qty: 100 each, Refills: 12   Associated Diagnoses: Type II or unspecified type diabetes mellitus without mention of complication, not stated as uncontrolled    insulin glargine (LANTUS) 100 UNIT/ML injection Inject  26 Units into the skin at bedtime. Qty: 10 mL, Refills: 3   Associated Diagnoses: Type II or unspecified type diabetes mellitus without mention of complication, not stated as uncontrolled; Headache; Acute sinusitis with symptoms > 10 days; Dysuria    Insulin Syringe-Needle U-100 (INSULIN SYRINGE .5CC/31GX5/16") 31G X 5/16" 0.5 ML MISC Use to inject insulin 3 times daily as instructed.     Insulin Syringes, Disposable, U-100 0.3 ML MISC Use to inject insulin Qty: 1 each, Refills: 6   Associated Diagnoses: Type II or unspecified type diabetes mellitus without mention of complication, not stated as uncontrolled; Headache; Acute sinusitis with symptoms > 10 days; Dysuria    isosorbide mononitrate (IMDUR) 60 MG 24 hr tablet Take 60  mg by mouth daily.      Lancets MISC Use to test blood sugar 3 times a day or as instructed.     lisinopril (PRINIVIL,ZESTRIL) 40 MG tablet Take 40 mg by mouth daily.      loratadine (CLARITIN) 10 MG tablet Take 10 mg by mouth daily.      nitroGLYCERIN (NITROSTAT) 0.4 MG SL tablet Place 0.4 mg under the tongue every 5 (five) minutes as needed.      ranitidine (ZANTAC) 75 MG tablet Take 75 mg by mouth daily as needed.      rosuvastatin (CRESTOR) 20 MG tablet Take 1 tablet (20 mg total) by mouth daily. Qty: 30 tablet, Refills: 6   Associated Diagnoses: Cerebral embolism with cerebral infarction; Hyperlipidemia    salmeterol (SEREVENT) 50 MCG/DOSE diskus inhaler Inhale 1 puff into the lungs daily.      senna (SENOKOT) 8.6 MG tablet Take 1-2 tablets by mouth daily. Qty: 60 tablet, Refills: 6   Associated Diagnoses: Constipation    zolpidem (AMBIEN) 5 MG tablet TAKE ONE TABLET AT BEDTIME                                                  Generic for AMBIEN 5 Qty: 30 tablet, Refills: 3      STOP taking these medications     warfarin (COUMADIN) 5 MG tablet         Disposition and follow-up:   [name] was discharged from [facility] in Good condition.   She was instructed to call the internal medicine residency clinic on Monday morning when they open so that she can have a followup appointment within the next 1-2 weeks. At that time the following item should be addressed:  1) please titrate blood pressure medications as clonidine patch was held at discharge. The patient was given the instruction to restart clonidine patch of her blood pressure is greater than 150 systolic or 100 diastolic.  2) please check CBC to ensure improvement in anemia. Also please inquire about symptoms of or signs of GI bleed.  3) please check basic metabolic panel for potassium.  4) please ensure the patient is NOT taking warfarin.  5) the patient was started on short acting  insulin 6 units 3 times a day meal coverage. Please titrate up as necessary. Long-acting insulin should NOT be increased secondary to early morning hypoglycemia.  Follow-up Appointments:  she has an appointment with her primary care doctor Dr. Nelda Bucks on 10/03/2011. Discharge Orders    Future Appointments: Provider: Department: Dept Phone: Center:   09/08/2011 3:15 PM Imp[facility]Im[facility] Coumadin  Clinic [facility] [phone] [facility]   10/03/2011 1:15 PM Nelda Bucks [facility] [phone] [facility]     Future Orders Please Complete By Expires   Diet - low sodium heart healthy      Increase activity slowly      Discharge instructions      Comments:   Discontinue taking warfarin. You no longer need this medicine. Please call the clinic on Monday to schedule a followup appointment within the next 1-2 weeks.   Call MD for:  extreme fatigue      (HEART FAILURE PATIENTS) Call MD:  Anytime you have any of the following symptoms: 1) 3 pound weight gain in 24 hours or 5 pounds in 1 week 2) shortness of breath, with or without a dry hacking cough 3) swelling in the hands, feet or stomach 4) if you have to sleep on extra pillows at night in order to breathe.      Call MD for:  persistant dizziness or light-headedness      Call MD for:  difficulty breathing, headache or visual disturbances      Call MD for:  severe uncontrolled pain      Call MD for:  persistant nausea and vomiting      Call MD for:  temperature >100.4      Increase activity slowly      Beta Blocker already ordered      STOP any activity that causes chest pain, shortness of breath, dizziness, sweating, or exessive weakness      Discontinue IV      ACE Inhibitor / ARB already ordered         Consultations:    Procedures Performed:  Dg Chest Portable 1 View  08/24/2011  *RADIOLOGY REPORT*  Clinical Data: Central line placement  PORTABLE CHEST - 1 VIEW  Comparison: 8/31/ 2012  Findings: Right IJ venous catheter with its tip  in the right atrium, 4 cm below the cavoatrial junction.  The lungs are essentially clear. No pleural effusion or pneumothorax.  Mild cardiomegaly.  IMPRESSION: Right IJ venous catheter with its tip in the right atrium, 4 cm below the cavoatrial junction.  No pneumothorax.  Original Report Authenticated By: Charline Bills, M.D.    2D Echo:  - Left ventricle: The cavity size was normal. Wall thickness was increased in a pattern of moderate LVH. Systolic function was normal. The estimated ejection fraction was in the range of 55% to 60%. Wall motion was normal; there were no regional wall motion abnormalities. Doppler parameters are consistent with abnormal left ventricular relaxation (grade 1 diastolic dysfunction). The E/e' ratio is >10, suggesting elevated LV filling pressure. - Mitral valve: Mildly thickened leaflets . Mild regurgitation. - Left atrium: The atrium was mildly dilated. - Tricuspid valve: Mild regurgitation. - Pulmonary arteries: PA peak pressure: 34mm Hg (S). - Inferior vena cava: The vessel was normal in size; the respirophasic diameter changes were in the normal range (= 50%); findings are consistent with normal central venous pressure. - Pericardium, extracardiac: Small, circumferential (mostly posterior), pericardial effusion. no signs of tamponade physiology.  EGD 08/26/2011 View from the mouth to the second portion of the duodenum. Impressions: Moderate gastritis was found in the antrum. Multiple small erosions. A biopsy for H. pylori was taken. Otherwise the examination was normal.  Colonoscopy: 08/28/2011 Endoscopic impression: the entire colon was visualized. #1) normal colonoscopy #2) given this suspicion was that the bleeding was from gastric erosions seen in the setting of  supratherapeutic INR Recommendations: #1) okay to restart warfarin #2) if rebleeding occurs, would likely perform capsule endoscopy of the small intestine #3) iron supplementation as  appropriate  Admission HPI:  Patient is a 77 y.o. female with a PMHx of poorly controlled diabetes mellitus type 2 with last hemoglobin 13.7 in June 2012, congestive heart failure with ejection fraction 25-35%, anemia baseline hemoglobin over past year of 10, chronic renal insufficiency with baseline creatinine of 1.3, history of cerebral embolism on indefinite warfarin treatment who presents with complaints of generalized weakness and dark stool x1day. She reports the last 2 days her blood sugars have been steadily increasing from 400-500 and most recently unreadable on her glucometer. She also states that she has not been responding to increased Lantus insulin from 20-30 units. She reports that yesterday she was attempting to walk to the bathroom using her walker and was overcome by physical weakness in her arms and legs. She subsequently collapsed to the floor, soling herself with urine and feces. She denies syncope or presyncope, dizziness, light-headness, chest pain, nausea or vomiting. She reports some mild shortness of breath at the time which has resolved and that her urine and stool were darker than normal. She denies hematuria, blood in stool, on toilet paper or in toilet water. Of note, her recent INR level was 3.4 on 08/18/2011 which was unchanged from prior level 07/28/2011 but increased from September and August levels of 3.0 and 2.10 respectively.  Admission physical exam: Vital Signs:  T:  98.1  P:  78  BP:  105/36  RR:  18  O2 sat:  100% r.a.   Physical Exam:  General:  Well-developed, well-nourished, elderly female, in no acute distress, cooperative throughout exam   Head:  Normocephalic, large old scar on left cheek   Eyes:  PERRLA, EOMI, anicteric.   Nose:  Nares clear without discharge or hypertrophy of turbinates   Throat:  Dry mucus membranes, Oropharynx nonerythematous, no exudate appreciated.   Neck:  No deformities, masses, or tenderness noted.Supple, No carotid Bruits, no  JVD.   Lungs:  Normal respiratory effort. Clear to auscultation anteriorly BL without crackles or wheezes from apices to bases.   Heart:  RRR. S1 and S2 normal with III/VI SEM   Abdomen:  BS normoactive. Soft, Nondistended, non-tender. No masses or organomegaly.appreciated   Extremities No pretibial edema, distal pulses difficult to appreciated but extremities warm with good capillary refill, bilateral claw foot   Neurologic A&O X3, CN II - XII are grossly intact. Motor strength is 4/5 in all extremities, Sensations grossly intact to light touch   Skin:  Multiple well healed excoriations on bilateral shins     Admission labs Prothrombin Time 34.2 (H) 11.6 - 15.2 seconds   INR 3.32 (H) 0.00 - 1.49    WBC 7.8 4.0 - 10.5 K/uL RBC 1.81 (L) 3.87 - 5.11 MIL/uL   Hemoglobin 4.7 (LL) 12.0 - 15.0 g/dL   HCT 13.0 (L) 86.5 - 78.4 %   MCV 82.9 78.0 - 100.0 fL   MCH 26.0 26.0 - 34.0 pg   MCHC 31.3 30.0 - 36.0 g/dL  RDW 69.6 29.5 - 28.4 %  Platelets 166 150 - 400 K/uL   Neutrophils Relative 81 (H) 43 - 77 %   Neutro Abs 6.3 1.7 - 7.7 K/uL   Lymphocytes Relative 14 12 - 46 %  Lymphs Abs 1.1 0.7 - 4.0 K/uL   Monocytes Relative 4 3 - 12 %   Monocytes Absolute 0.3 0.1 -  1.0 K/uL   Eosinophils Relative 0 0 - 5 %   Eosinophils Absolute 0.0 0.0 - 0.7 K/uL   Basophils Relative 0 0 - 1 %   Basophils Absolute 0.0 0.0 -   Sodium 130 (L) 135 - 145 mEq/L   Potassium 4.6 3.5 - 5.1 mEq/L   Chloride 98 96 - 112 mEq/L   CO2 19 19 - 32 mEq/L   Glucose, Bld 894 (HH) 70 - 99 mg/dL    BUN 621 (H) 6 - 23 mg/dL   Creatinine, Ser 3.08 (H) 0.50 - 1.10 mg/dL  Calcium 9.4 8.4 - 65.7 mg/dL   Total Protein 5.7 (L) 6.0 - 8.3 g/dL   Albumin 2.7 (L) 3.5 - 5.2 g/dL   AST 13 0 - 37 U/L   ALT 12 0 - 35 U/L   Alkaline Phosphatase 50 39 - 117 U/L   Total Bilirubin 0.1 (L) 0.3 - 1.2 mg/dL   GFR calc Af Amer 32 (L) >90 mL/min  Troponin i, poc 0.02 0.00 - 0.08 ng/mL FOBT; positive  Hospital Course by problem  list:  1) GI bleeding and acute poshemorrhagic anemia secondary to gastritis- Ms. Butzin was admitted for dizziness and melanotic stools. At admission, FOBT was positive and hemoglobin t was 4.9 from baseline of approximately 10. Her INR was supratherapeutic at 3.3 . She was transferred to the ICU where she received  3 units PRBCs, and anticoagulation was reversed with FFP and vitamin K. She was presumptively treated with PPI. Ms. Shiffman hemoglobin responded robustly to this intervention and remained stable at approximately 8.8 the rest of her admission. She was transferred to the medicine teaching service again on hospital day 3. EGD showed erosive gastritis with superficial ulcers in the antrum. Biopsy for H. pylori was negative. Colonoscopy was completely normal. Warfarin was briefly restarted after colonoscopy was normal with a low target INR of 2-2.5. This was with the presumption that her GI bleed was secondary to gastritis in the setting of a supratherapeutic INR. Coumadin was discontinued after repeat echo showed resolution of heart failure.  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 MRI on 05/27/2010 revealed small acute/subacute anterior right frontal/periventricular infarct and left occipital lobe infarcts c/w embolic phenomenon. During this episode, Ms. Reen' ejection fraction was 25-30%.  As she had heart failure with periprocedural embolic phenomenon, the decision was made at that time to place Ms. Ishee on chronic anticoagulation with warfarin. Chart review revealed no evidence of atrial fibrillation during that time.  During her current admission for acute GI bleed warfarin and aspirin were held. She was monitored on telemetry without evidence of atrial fibrillation for 5 days. Repeat 2-D echo during this admission on 08/29/2011 showed EF of 55-60%. With improvement in ejection fraction, the indication for her to be on warfarin  no longer exists and she has just experienced a life-threatening complication of warfarin therapy. The risk and benefit of continuing warfarin therapy was discussed with Ms. Bento. She was informed that discontinuing warfarin would increase her risk of stroke however there is no proven mortality benefit for warfarin in her case. Her preference was to discontinue warfarin therapy.  Based on these factors, the decision was made to discontinue warfarin. Ms. Livingood likely needs some stroke prophylaxis, but this would be better served with an antiplatelet agent such as aspirin 81 mg daily, which was restarted at discharge. As an outpatient, her other risk factors for stroke  such as blood pressure, cholesterol, and diabetes control should be optimized.   3) Stable chronic diastolic CONGESTIVE HEART FAILURE -there was no evidence of pulmonary edema on exam ring this admission. O2 sats greater than 98% on room air. Ms. Diesing' transthoracic echo showed normal EF of 55-60% with mild diastolic dysfunction. Rosuvastatin and carvedilol were continued during her admission. Lisinopril and isosorbide mononitrate were continued at discharge.  4) DIABETES MELLITUS, TYPE II - admission hemoglobin A1c was 8.8, which is improved from previous. Lantus was continued at her home dose of 26 units each bedtime, with the exception of when she was made n.p.o. for EGD and colonoscopy. With increased dose of Lantus she had early morning hypoglycemia. To maximize her glucose control she was placed on meal coverage with 6 units of aspart insulin and sliding scale insulin coverage.0 -- Her total dose of Lantus is limited secondary to early morning hypoglycemia. We will start her on meal coverage with short acting insulin 6 units before meals.   5) Hypertension- Full outpatient regimen was held at admission secondary to soft blood pressure in the 100s. ACE inhibitor initially held due to acute bump in creatinine, however this rapidly resolved.   Beta blocker, amlodipine, isosorbide mononitrate and lisinopril. Clonidine patch was held at discharge with the instruction to resume if her blood pressure is greater than 150 systolic or 100 diastolic.  6) CAD (coronary artery disease) - ASA held in the setting of acute GI bleed. ACE was initially held held given acute renal injury. Carvedilol, amlodipine, isosorbide mononitrate and lisinopril were added back as blood pressure allowed. Rosuvastatin was continued throughout the admission.  7) ACUTE ON CHRONIC renal insufficiency - Ms. Datta' baseline creatinine is between 1.2-1.3. On admission, it was elevated to 1.4. This rapidly corrected after she was transfused and her creatinine was 1.05 at discharge.  8) hyperlipidemia-rosuvastatin was continued per home dose    Discharge Vitals:  BP 172/74  Pulse 60  Temp(Src) 98.7 F (37.1 C) (Oral)  Resp 17  Ht 5\' 3"  (1.6 m)  Wt 174 lb 6.1 oz (79.1 kg)  BMI 30.89 kg/m2  SpO2 100%  LMP 12/19/1968  Discharge Labs:  Results for orders placed during the hospital encounter of 08/23/11 (from the past 48 hour(s))  GLUCOSE, CAPILLARY     Status: Abnormal   Collection Time   08/28/11 12:32 PM      Component Value Range Comment   Glucose-Capillary 198 (*) 70 - 99 (mg/dL)    Comment 1 Notify RN     GLUCOSE, CAPILLARY     Status: Abnormal   Collection Time   08/28/11  5:06 PM      Component Value Range Comment   Glucose-Capillary 235 (*) 70 - 99 (mg/dL)   GLUCOSE, CAPILLARY     Status: Abnormal   Collection Time   08/28/11 10:11 PM      Component Value Range Comment   Glucose-Capillary 229 (*) 70 - 99 (mg/dL)    Comment 1 Notify RN      Comment 2 Documented in Chart     PROTIME-INR     Status: Abnormal   Collection Time   08/29/11  6:25 AM      Component Value Range Comment   Prothrombin Time 15.9 (*) 11.6 - 15.2 (seconds)    INR 1.24  0.00 - 1.49    CBC     Status: Abnormal   Collection Time   08/29/11  6:25 AM      Component  Value  Range Comment   WBC 6.5  4.0 - 10.5 (K/uL)    RBC 3.21 (*) 3.87 - 5.11 (MIL/uL)    Hemoglobin 8.8 (*) 12.0 - 15.0 (g/dL)    HCT 04.5 (*) 40.9 - 46.0 (%)    MCV 85.0  78.0 - 100.0 (fL)    MCH 27.4  26.0 - 34.0 (pg)    MCHC 32.2  30.0 - 36.0 (g/dL)    RDW 81.1  91.4 - 78.2 (%)    Platelets 141 (*) 150 - 400 (K/uL)   BASIC METABOLIC PANEL     Status: Abnormal   Collection Time   08/29/11  6:25 AM      Component Value Range Comment   Sodium 140  135 - 145 (mEq/L)    Potassium 3.3 (*) 3.5 - 5.1 (mEq/L)    Chloride 108  96 - 112 (mEq/L)    CO2 25  19 - 32 (mEq/L)    Glucose, Bld 241 (*) 70 - 99 (mg/dL)    BUN 9  6 - 23 (mg/dL)    Creatinine, Ser 9.56  0.50 - 1.10 (mg/dL)    Calcium 8.6  8.4 - 10.5 (mg/dL)    GFR calc non Af Amer 50 (*) >90 (mL/min)    GFR calc Af Amer 58 (*) >90 (mL/min)   GLUCOSE, CAPILLARY     Status: Abnormal   Collection Time   08/29/11  6:54 AM      Component Value Range Comment   Glucose-Capillary 251 (*) 70 - 99 (mg/dL)    Comment 1 Documented in Chart      Comment 2 Notify RN     GLUCOSE, CAPILLARY     Status: Abnormal   Collection Time   08/29/11 11:31 AM      Component Value Range Comment   Glucose-Capillary 263 (*) 70 - 99 (mg/dL)   GLUCOSE, CAPILLARY     Status: Abnormal   Collection Time   08/29/11  4:45 PM      Component Value Range Comment   Glucose-Capillary 212 (*) 70 - 99 (mg/dL)   GLUCOSE, CAPILLARY     Status: Abnormal   Collection Time   08/29/11 10:26 PM      Component Value Range Comment   Glucose-Capillary 154 (*) 70 - 99 (mg/dL)   GLUCOSE, CAPILLARY     Status: Normal   Collection Time   08/30/11  6:28 AM      Component Value Range Comment   Glucose-Capillary 88  70 - 99 (mg/dL)   PROTIME-INR     Status: Abnormal   Collection Time   08/30/11  6:30 AM      Component Value Range Comment   Prothrombin Time 16.2 (*) 11.6 - 15.2 (seconds)    INR 1.27  0.00 - 1.49    GLUCOSE, CAPILLARY     Status: Abnormal   Collection Time    08/30/11 11:58 AM      Component Value Range Comment   Glucose-Capillary 215 (*) 70 - 99 (mg/dL)     Signed: Betzaira Mentel 08/30/2011, 12:04 PM

## 2011-08-30 NOTE — Plan of Care (Deleted)
Problem: Discharge Progression Outcomes Goal: Other Discharge Outcomes/Goals Outcome: Progressing Pt is to be discharged to [facility].

## 2011-08-30 NOTE — Discharge Planning (Signed)
SL removed from pt's R FA, no bleeding noted, catheter tip intact.  Pt given d/c instructions along with f/u apt catapres to be taken for SBP > 150, Iron and Insulin called into [facility].  Pt verbalized understanding of d/c instructions along with medication reconciliation.

## 2011-09-02 ENCOUNTER — Telehealth: Payer: Self-pay | Admitting: Dietician

## 2011-09-02 DIAGNOSIS — E119 Type 2 diabetes mellitus without complications: Secondary | ICD-10-CM

## 2011-09-02 NOTE — Telephone Encounter (Signed)
Return call that [name]'s cell number is : [phone]

## 2011-09-03 ENCOUNTER — Encounter: Payer: Self-pay | Admitting: Internal Medicine

## 2011-09-03 ENCOUNTER — Ambulatory Visit (INDEPENDENT_AMBULATORY_CARE_PROVIDER_SITE_OTHER): Payer: Medicare Other | Admitting: Internal Medicine

## 2011-09-03 VITALS — BP 130/59 | HR 74 | Temp 97.0°F | Ht 60.0 in | Wt 174.5 lb

## 2011-09-03 DIAGNOSIS — K922 Gastrointestinal hemorrhage, unspecified: Secondary | ICD-10-CM

## 2011-09-03 DIAGNOSIS — E119 Type 2 diabetes mellitus without complications: Secondary | ICD-10-CM

## 2011-09-03 DIAGNOSIS — D649 Anemia, unspecified: Secondary | ICD-10-CM

## 2011-09-03 DIAGNOSIS — I10 Essential (primary) hypertension: Secondary | ICD-10-CM

## 2011-09-03 DIAGNOSIS — K2970 Gastritis, unspecified, without bleeding: Secondary | ICD-10-CM

## 2011-09-03 NOTE — Progress Notes (Signed)
  Subjective:    Patient ID: [name], female    DOB: 04-Nov-1932, 77 y.o.   MRN: [id number]  HPI  Patient is here today for a hospital follow up. She was admitted for GI bleed.  No more bleeds noted. She feels well.  BP is 130/59 which is well controlled without clonidine.  Patient brought her medications and it seems like there are a lots of meds in the medication list which she is not on. The list has not been updated. We reviewed the list and deleted the meds that she was not on but were in the list.  Her CBG's are still high. She was 216 fasting this morning. She will see Gavin Pound today for counseling.   Review of Systems  Constitutional: Negative for fever, activity change and appetite change.  HENT: Negative for sore throat.   Respiratory: Negative for cough and shortness of breath.   Cardiovascular: Negative for chest pain and leg swelling.  Gastrointestinal: Negative for nausea, abdominal pain, diarrhea, constipation and abdominal distention.  Genitourinary: Negative for frequency, hematuria and difficulty urinating.  Neurological: Negative for dizziness and headaches.  Psychiatric/Behavioral: Negative for suicidal ideas and behavioral problems.       Objective:   Physical Exam  Constitutional: She is oriented to person, place, and time. She appears well-developed and well-nourished.  HENT:  Head: Normocephalic and atraumatic.  Eyes: Conjunctivae and EOM are normal. Pupils are equal, round, and reactive to light. No scleral icterus.  Neck: Normal range of motion. Neck supple. No JVD present. No thyromegaly present.  Cardiovascular: Normal rate, regular rhythm, normal heart sounds and intact distal pulses.  Exam reveals no gallop and no friction rub.   No murmur heard. Pulmonary/Chest: Effort normal and breath sounds normal. No respiratory distress. She has no wheezes. She has no rales.  Abdominal: Soft. Bowel sounds are normal. She exhibits no distension and no  mass. There is no tenderness. There is no rebound and no guarding.  Musculoskeletal: Normal range of motion. She exhibits no edema and no tenderness.  Lymphadenopathy:    She has no cervical adenopathy.  Neurological: She is alert and oriented to person, place, and time.  Psychiatric: She has a normal mood and affect. Her behavior is normal.          Assessment & Plan:

## 2011-09-03 NOTE — Patient Instructions (Signed)
Gastrointestinal Bleeding Gastrointestinal (GI) bleeding is bleeding from the gut or any place between your mouth and anus. If bleeding is slow, you may be allowed to go home. If there is a lot of bleeding, hospitalization and observation are often required. SYMPTOMS   You vomit bright red blood or material that looks like coffee grounds.   You have blood in your stools or the stools look black and tarry.  DIAGNOSIS  Your caregiver may diagnose your condition by taking a history and a physical exam. More tests may be needed, including:  X-rays.   EGD (esophagogastroduodenoscopy), which looks at your esophagus, stomach, and small bowel through a flexible telescope-like instrument.   Colonoscopy, which looks at your colon/large bowel through a flexible telescope-like instrument.   Biopsies, which remove a small sample of tissue to examine under a microscope.  Finding out the results of your test Not all test results are available during your visit. If your test results are not back during the visit, make an appointment with your caregiver to find out the results. Do not assume everything is normal if you have not heard from your caregiver or the medical facility. It is important for you to follow up on all of your test results. HOME CARE INSTRUCTIONS   Follow instructions as suggested by your caregiver regarding medicines. Do not take aspirin, drink alcohol, or take medicines for pain and arthritis unless your caregiver says it is okay.   Get the suggested follow-up care when the tests are done.  SEEK IMMEDIATE MEDICAL CARE IF:   Your bleeding increases or you become lightheaded, weak, or pass out (faint).   You experience severe cramps in your stomach, back, or belly (abdomen).   You pass large clots.   The problems which brought you in for medical care get worse.  MAKE SURE YOU:   Understand these instructions.   Will watch your condition.   Will get help right away if you are  not doing well or get worse.  Document Released: 09/26/2000 Document Revised: 06/11/2011 Document Reviewed: 05/18/2008 [facility] Patient Information 2012 [place], [place].

## 2011-09-04 LAB — COMPLETE METABOLIC PANEL WITH GFR
ALT: 10 U/L (ref 0–35)
Alkaline Phosphatase: 61 U/L (ref 39–117)
CO2: 24 mEq/L (ref 19–32)
Creat: 1.33 mg/dL — ABNORMAL HIGH (ref 0.50–1.10)
GFR, Est African American: 45 mL/min — ABNORMAL LOW
Sodium: 134 mEq/L — ABNORMAL LOW (ref 135–145)
Total Bilirubin: 0.4 mg/dL (ref 0.3–1.2)

## 2011-09-04 LAB — CBC WITH DIFFERENTIAL/PLATELET
Basophils Absolute: 0 10*3/uL (ref 0.0–0.1)
Basophils Relative: 1 % (ref 0–1)
Eosinophils Absolute: 0.1 10*3/uL (ref 0.0–0.7)
Eosinophils Relative: 1 % (ref 0–5)
Lymphs Abs: 1.2 10*3/uL (ref 0.7–4.0)
MCH: 28 pg (ref 26.0–34.0)
MCHC: 31.3 g/dL (ref 30.0–36.0)
MCV: 89.6 fL (ref 78.0–100.0)
Neutrophils Relative %: 78 % — ABNORMAL HIGH (ref 43–77)
Platelets: 297 10*3/uL (ref 150–400)
RDW: 16.4 % — ABNORMAL HIGH (ref 11.5–15.5)

## 2011-09-06 NOTE — Assessment & Plan Note (Signed)
I will check CBC today. Although patient denies any bleed since discharge.

## 2011-09-06 NOTE — Assessment & Plan Note (Signed)
Patient's blood pressure is well controlled today. I will check a metabolic profile. No new changes in medication.

## 2011-09-06 NOTE — Assessment & Plan Note (Signed)
Patient had mild gastritis in the EGD done during the hospital course of stay. Patient was taken off Coumadin. She was not put on any PPIs at this time. I will leave this to the primary care physician to decide.

## 2011-09-06 NOTE — Assessment & Plan Note (Signed)
Patient's CBC are still high. She would be talking to Gavin Pound today regarding counseling for eating. She is currently taking Lantus 26 units at bedtime. She was recently started on NovoLog 6 units 3 times with meals. The instructions at discharge for the marked increase the Lantus at this time. Patient has a followup appointment in 2 weeks the primary care physician. They can decide based on the intervening CBGs, whether to go up on Lantus or not. No changes in medications today.

## 2011-09-08 ENCOUNTER — Ambulatory Visit: Payer: Self-pay

## 2011-09-09 ENCOUNTER — Encounter (HOSPITAL_COMMUNITY): Payer: Self-pay | Admitting: Internal Medicine

## 2011-09-10 NOTE — Telephone Encounter (Signed)
CDE did not see patient at her hospital follow up. Still unable to reach patient by phone. Scheduled a same day appointment with doctor for her upcoming appointment in December and mailed her a letter stating them same.

## 2011-09-17 ENCOUNTER — Other Ambulatory Visit: Payer: Self-pay | Admitting: Internal Medicine

## 2011-10-03 ENCOUNTER — Encounter: Payer: Self-pay | Admitting: Internal Medicine

## 2011-10-03 ENCOUNTER — Ambulatory Visit (INDEPENDENT_AMBULATORY_CARE_PROVIDER_SITE_OTHER): Payer: Medicare Other | Admitting: Internal Medicine

## 2011-10-03 ENCOUNTER — Ambulatory Visit (INDEPENDENT_AMBULATORY_CARE_PROVIDER_SITE_OTHER): Payer: Medicare Other | Admitting: Dietician

## 2011-10-03 VITALS — BP 196/82 | HR 76 | Temp 98.6°F | Ht 60.0 in | Wt 175.6 lb

## 2011-10-03 DIAGNOSIS — I10 Essential (primary) hypertension: Secondary | ICD-10-CM

## 2011-10-03 DIAGNOSIS — E119 Type 2 diabetes mellitus without complications: Secondary | ICD-10-CM

## 2011-10-03 DIAGNOSIS — G4700 Insomnia, unspecified: Secondary | ICD-10-CM

## 2011-10-03 LAB — BASIC METABOLIC PANEL
Calcium: 9.8 mg/dL (ref 8.4–10.5)
Chloride: 103 mEq/L (ref 96–112)
Creat: 1.16 mg/dL — ABNORMAL HIGH (ref 0.50–1.10)
Sodium: 139 mEq/L (ref 135–145)

## 2011-10-03 LAB — GLUCOSE, CAPILLARY

## 2011-10-03 LAB — TSH: TSH: 1.461 u[IU]/mL (ref 0.350–4.500)

## 2011-10-03 MED ORDER — GLUCOSE BLOOD VI STRP: ORAL_STRIP | Status: DC

## 2011-10-03 MED ORDER — TRAZODONE HCL 50 MG PO TABS: 50.0000 mg | ORAL_TABLET | Freq: Every day | ORAL | Status: DC
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
## 2011-10-03 NOTE — Patient Instructions (Signed)
Schedule a follow up appointment with Dr. Arvilla Market on Friday 10/17/11. Bring your meter with you to this appointment. Keep taking 26 units of Lantus each night and check your CBGs regularly. Trazodone is a medicine to help you sleep.  Start with one half of a tablet at night.  If this does not help you sleep, try one full tablet.  You may take up to two tablets at once before bed to help you sleep. Call the clinic at [phone] with any concerns or questions. Keep taking all of your other medicine as directed. Merry Christmas and Happy New Year!

## 2011-10-03 NOTE — Assessment & Plan Note (Addendum)
Pt very hypertensive at OV today; SBP 206 on repeat check.  Pt took her anti-HTN agents this am; states she checked BP at home and reports SBP of 150.  Pt denies h/a, vision changes, or focal weakness.  She reports significant stress at home.  Son and mother are both hospitalized; cousin with recent MI.  Pt also very sad and upset about world events.  She has become increasingly anxious 2/2 poor sleep over the past few days - weeks.

## 2011-10-03 NOTE — Progress Notes (Signed)
Patient ID: [name], female   DOB: 12-24-1932, 77 y.o.   MRN: [id number]  HPI Please see the A&P for the status of the pt's chronic medical problems.   Review of Systems  Constitutional: Negative for fever, chills, diaphoresis, activity change, appetite change, fatigue and unexpected weight change.  HENT: Negative for hearing loss, congestion, sneezing, neck stiffness, postnasal drip and sinus pressure.   Eyes: Negative for photophobia, pain and visual disturbance.  Respiratory: Negative for cough, chest tightness, shortness of breath and wheezing.   Cardiovascular: Negative for chest pain and palpitations.  Gastrointestinal: Negative for abdominal pain, blood in stool and anal bleeding.  Genitourinary: Negative for dysuria, frequency, hematuria, flank pain, decreased urine volume and difficulty urinating.  Musculoskeletal: Negative for joint swelling.  Neurological: Negative for dizziness, syncope, speech difficulty, weakness, numbness and headaches.       Objective:   Physical Exam      VItal signs reviewed and stable.  BP significantly elevated above goal. GEN: No apparent distress.  Alert and oriented x 3.  Pleasant, conversant, and cooperative to exam. HEENT: head is autraumatic and normocephalic.  Neck is supple without palpable masses or lymphadenopathy.  No JVD or carotid bruits.  Vision intact.  EOMI.  PERRLA.  Sclerae anicteric.  Conjunctivae without pallor or injection. Mucous membranes are moist.  Oropharynx is without erythema, exudates, or other abnormal lesions; increased mucoid drainage noted in posterior oropharynx. Mild tenderness to palpation of bilateral maxillary sinuses RESP:  Lungs are clear to ascultation bilaterally with good air movement.  No wheezes, ronchi, or rubs. CARDIOVASCULAR: regular rate, normal rhythm.  Clear S1, S2, 3/6 systolic murmurs, gallops, or rubs. ABDOMEN: soft, non-tender, non-distended.  Bowels sounds present in all quadrants and  normoactive.  No palpable masses. EXT: warm and dry.  Peripheral pulses equal, intact, and +2 globally with the exception of +1 pulse in LLE (pedal).  No clubbing or cyanosis.  No edema in bilateral lower extremities. SKIN: warm and dry with normal turgor.  No rashes or abnormal lesions observed. NEURO: CN II-XII grossly intact.  Muscle strength +5/5 in bilateral upper and lower extremities.  Sensation is grossly intact.  No focal deficit.   Assessment & Plan:

## 2011-10-03 NOTE — Assessment & Plan Note (Addendum)
Last A1c was 8.8 in November 2012.   Pt is due for annual eye exam and annual foot exam.  Will perform foot exam today.  Pt reports difficulty obtaining CBG testing strips.  Pt reports [name] did not have strips so she had to purchase 50 strips OTC for 20$ from [facility].  She has insulin 70/30 with her in her med bag that was prescribed by Dr. Dierdre Highman following an ER visit in 08/2011.  She states she is only Lantus 26u QHS; is not using any of the 70/30 or meal time insulin prescribed at hospital d/c.  Review of her CBGs reveals an average of 200-300 with 2 lows of 61 and 57; bot hypoglycemic events occurred around 8:30am pt thinks these occurred b/c she wasn't hungry and did not eat breakfast.   Will have her continue to use only Lantus and will have her f/u on Jan 4  to review CBGs and assess need to adjust insulin regimen further.  Reminded pt to try to avoid missing/skipping meals.

## 2011-10-03 NOTE — Progress Notes (Signed)
Diabetes Self-Management Training (DSMT)  Follow up visit 2 Visit  10/03/2011 [name], identified by name and date of birth, is a 77 y.o. female with Type 2 Diabetes. Year of diabetes diagnosis: 49s Other persons present: [name] her grandaughter came in office to learn how to use new meter  ASSESSMENT Patient concerns are blood sugars, not able to get strips to check her blood sugar.  Last menstrual period 12/19/1968. There is no height or weight on file to calculate BMI. Lab Results  Component Value Date   LDLCALC 44 03/31/2011   Lab Results  Component Value Date   HGBA1C 8.8* 08/26/2011   Medication Nutrition Monitor: patient unsure  Labs reviewed.  DIABETES BUNDLE: A1C in past 6 months? Yes.  Less than 7%? No LDL in past year? Yes.  Less than 100 mg/dL? Yes Microalbumin ratio in past year? No. Patient taking ACE or ARB? Yes. Blood pressure less than 130/80? Yes. Foot exam in last year? Yes. Eye exam in past year? No.  Reminded patient to schedule eye exam. Tobacco use? No. Pneumovax? Yes Flu vaccine? Yes Asprin? Yes  Family history of diabetes: Yes Support systems: family- grandchildren Special needs: Large print, patient reports memory problems Prior DM Education: Yes Patients belief/attitude about diabetes: Diabetes can be controlled. Self foot exams daily: No- granddaughter inspect weekly Diabetes Complications: Nephropathy Gastroparesis    Medications See Medications list.  Needs education.    Exercise Plan Doing ADLs and says she does chari exercises and gets out for walks when she has someone to go with her.   Self-Monitoring Frequency of testing: 1-3 times/day Breakfast: 47/61/107/122/144/142/156/153 Supper: 132, 286  Bedtime: 200s and 300s  patient blood glucose goal 115- 250, gets concerned about 300-400.    Hyperglycemia: Yes Daily Hypoglycemia: No   Meal Planning Some knowledge and Interested in improving  Assessment  comments: Patient now only taking lantus 26 units at bedtime. She says she "used the pen they gave her in the hospital and her blood sugars kept going up so she went back to the vial." Her most recent A1C in November of 8.8% and today's blood glucose pattern are consistent. Her fasting blood sugar is controlled with Lantus 26 units, but her prandial are most often not well controlled. Question if fasting hypoglycemia occurred when patient used the Novolog. Suggest prandial coverage to lower her A1C below 8%, optimize nutritional status and avoid dehydration: could consider trying januvia, prandin or novolog 3 units with each meal and would decrease lantus simultaneously to avoid hypoglycemia.     INDIVIDUAL DIABETES EDUCATION PLAN:  Medication Monitoring _______________________________________________________________________  Intervention TOPICS COVERED TODAY:   Monitoring: Discussed meter insurance coverage and trained patient and her granddaughter [name] on use of Accu check Compact Plus.  PATIENTS GOALS/PLAN (copy and paste in patient instructions so patient receives a copy): 1.  Learning Objective:       Please call if you have trouble getting your diabetes supplies 2.  Behavioral Objective: Medications: To improve blood glucose levels, I will use insulin pen to give insulin injection Delete this goal. Monitoring: To identify blood glucose trends, I will bring meter to all clinic visits Now at 100%. Goal resolved.  Bring meter to next appointment to be downloaded and to be sure you are able to use.  Call if you have problems with insurance coverage of diabetes supplies or medications Sometimes 25%  Personalized Follow-Up Plan for Ongoing Self Management Support:  Doctor's Office, friends, family and CDE visits ______________________________________________________________________  Outcomes Expected outcomes: Demonstrated interest in learning.Expect positive changes in  lifestyle. Self-care Barriers: Hard of hearing, Impaired vision, Debilitated state due to current medical condition, Lack of material resources Education material provided:new Meter  With intructions Patient to contact team via Phone if problems or questions. Time in: 1430     Time out: 1500  Future DSMT - 3 months   Plyler, [name]

## 2011-10-08 ENCOUNTER — Other Ambulatory Visit: Payer: Self-pay | Admitting: Internal Medicine

## 2011-10-09 ENCOUNTER — Telehealth: Payer: Self-pay | Admitting: *Deleted

## 2011-10-09 MED ORDER — ACCU-CHEK COMPACT PLUS CARE KIT: PACK | Status: DC

## 2011-10-09 MED ORDER — GLUCOSE BLOOD VI STRP: ORAL_STRIP | Status: DC

## 2011-10-09 MED ORDER — LANCETS MISC: Status: AC

## 2011-10-09 NOTE — Telephone Encounter (Signed)
Orders entered

## 2011-10-09 NOTE — Telephone Encounter (Signed)
Call from Granddaughter Valentina Lucks.  Needs orders for Accu-chek Compact Plus Glucose meter, Lancets and strips.  Will need to go to the [facility] on [address].  Angelina Ok, RN 10/08/2012 3:06 PM

## 2011-10-10 MED ORDER — ACCU-CHEK SOFTCLIX LANCET DEV MISC: Status: DC

## 2011-10-10 MED ORDER — GLUCOSE BLOOD VI STRP: ORAL_STRIP | Status: DC

## 2011-10-10 MED ORDER — ACCU-CHEK COMPACT PLUS CARE KIT: 1.0000 | PACK | Freq: Three times a day (TID) | Status: DC

## 2011-10-10 NOTE — Telephone Encounter (Signed)
Spoke with Shanda Bumps- she thinks her request has been straightened out: [facility] to fax prescription to [facility] where grandaughter to pick up. Told her to call if nay problems.

## 2011-10-13 ENCOUNTER — Other Ambulatory Visit: Payer: Self-pay | Admitting: Internal Medicine

## 2011-10-17 ENCOUNTER — Encounter: Payer: Medicare Other | Admitting: Internal Medicine

## 2011-11-12 ENCOUNTER — Other Ambulatory Visit: Payer: Self-pay | Admitting: Internal Medicine

## 2011-12-12 ENCOUNTER — Encounter: Payer: Self-pay | Admitting: Internal Medicine

## 2011-12-12 ENCOUNTER — Ambulatory Visit (INDEPENDENT_AMBULATORY_CARE_PROVIDER_SITE_OTHER): Payer: Medicare Other | Admitting: Internal Medicine

## 2011-12-12 VITALS — BP 182/88 | HR 81 | Temp 98.2°F | Ht 60.0 in | Wt 178.1 lb

## 2011-12-12 DIAGNOSIS — R300 Dysuria: Secondary | ICD-10-CM

## 2011-12-12 DIAGNOSIS — R51 Headache: Secondary | ICD-10-CM

## 2011-12-12 DIAGNOSIS — J019 Acute sinusitis, unspecified: Secondary | ICD-10-CM

## 2011-12-12 DIAGNOSIS — G4700 Insomnia, unspecified: Secondary | ICD-10-CM

## 2011-12-12 DIAGNOSIS — D649 Anemia, unspecified: Secondary | ICD-10-CM

## 2011-12-12 DIAGNOSIS — E785 Hyperlipidemia, unspecified: Secondary | ICD-10-CM

## 2011-12-12 DIAGNOSIS — I10 Essential (primary) hypertension: Secondary | ICD-10-CM

## 2011-12-12 DIAGNOSIS — E119 Type 2 diabetes mellitus without complications: Secondary | ICD-10-CM

## 2011-12-12 DIAGNOSIS — Z79899 Other long term (current) drug therapy: Secondary | ICD-10-CM

## 2011-12-12 LAB — CBC
Hemoglobin: 12.9 g/dL (ref 12.0–15.0)
MCH: 27 pg (ref 26.0–34.0)
MCHC: 32.2 g/dL (ref 30.0–36.0)
MCV: 84.1 fL (ref 78.0–100.0)
Platelets: 245 10*3/uL (ref 150–400)
WBC: 5.7 10*3/uL (ref 4.0–10.5)

## 2011-12-12 LAB — POCT GLYCOSYLATED HEMOGLOBIN (HGB A1C): Hemoglobin A1C: 13.4

## 2011-12-12 LAB — COMPREHENSIVE METABOLIC PANEL
ALT: 11 U/L (ref 0–35)
Albumin: 3.7 g/dL (ref 3.5–5.2)
BUN: 24 mg/dL — ABNORMAL HIGH (ref 6–23)
CO2: 22 mEq/L (ref 19–32)
Creat: 1.18 mg/dL — ABNORMAL HIGH (ref 0.50–1.10)
Glucose, Bld: 220 mg/dL — ABNORMAL HIGH (ref 70–99)
Total Bilirubin: 0.4 mg/dL (ref 0.3–1.2)

## 2011-12-12 LAB — LIPID PANEL
Cholesterol: 206 mg/dL — ABNORMAL HIGH (ref 0–200)
Total CHOL/HDL Ratio: 2.3 Ratio
Triglycerides: 64 mg/dL (ref <150)
VLDL: 13 mg/dL (ref 0–40)

## 2011-12-12 LAB — GLUCOSE, CAPILLARY: Glucose-Capillary: 234 mg/dL — ABNORMAL HIGH (ref 70–99)

## 2011-12-12 MED ORDER — TRAZODONE HCL 50 MG PO TABS: 25.0000 mg | ORAL_TABLET | Freq: Every day | ORAL | Status: DC

## 2011-12-12 MED ORDER — ISOSORBIDE MONONITRATE ER 60 MG PO TB24: 60.0000 mg | ORAL_TABLET | Freq: Every day | ORAL | Status: DC

## 2011-12-12 MED ORDER — INSULIN GLARGINE 100 UNIT/ML ~~LOC~~ SOLN: 30.0000 [IU] | Freq: Every day | SUBCUTANEOUS | Status: DC

## 2011-12-12 MED ORDER — METOPROLOL SUCCINATE ER 25 MG PO TB24: 25.0000 mg | ORAL_TABLET | Freq: Every day | ORAL | Status: DC

## 2011-12-12 NOTE — Progress Notes (Signed)
Patient ID: KENDRA GRISSETT, female   DOB: Mar 13, 1933, 78 y.o.   MRN: [id number] Patient ID: CHRISTMAS FARACI, female   DOB: 09-09-1933, 78 y.o.   MRN: [id number]  HPI Please see the A&P for the status of the pt's chronic medical problems.   Review of Systems  Constitutional: Negative for fever, chills, diaphoresis, activity change, appetite change, fatigue and unexpected weight change.  HENT: Negative for hearing loss, congestion, sneezing, neck stiffness, postnasal drip and sinus pressure.   Eyes: Negative for photophobia, pain and visual disturbance.  Respiratory: Negative for cough, chest tightness, shortness of breath and wheezing.   Cardiovascular: Negative for chest pain and palpitations.  Gastrointestinal: Negative for abdominal pain, blood in stool and anal bleeding.  Genitourinary: Negative for dysuria, frequency, hematuria, flank pain, decreased urine volume and difficulty urinating.  Musculoskeletal: Negative for joint swelling.  Neurological: Negative for dizziness, syncope, speech difficulty, weakness, numbness and headaches.       Objective:   Physical Exam      VItal signs reviewed and stable.   GEN: No apparent distress.  Alert and oriented x 3.  Pleasant, conversant, and cooperative to exam. HEENT: head is autraumatic and normocephalic.  Neck is supple without palpable masses or lymphadenopathy.  No JVD or carotid bruits.  Vision intact.  EOMI.  PERRLA.  Sclerae anicteric.  Conjunctivae without pallor or injection. Mucous membranes are moist.  Oropharynx is without erythema, exudates, or other abnormal lesions; increased mucoid drainage noted in posterior oropharynx. Mild tenderness to palpation of bilateral maxillary sinuses RESP:  Lungs are clear to ascultation bilaterally with good air movement.  No wheezes, ronchi, or rubs. CARDIOVASCULAR: regular rate, normal rhythm.  Clear S1, S2, 3/6 systolic murmurs, gallops, or rubs. ABDOMEN: soft, non-tender, non-distended.  Bowels  sounds present in all quadrants and normoactive.  No palpable masses. EXT: warm and dry.  Peripheral pulses equal, intact, and +2 globally with the exception of +1 pulse in LLE (pedal).  No clubbing or cyanosis.  No edema in bilateral lower extremities. SKIN: warm and dry with normal turgor.  No rashes or abnormal lesions observed. NEURO: CN II-XII grossly intact.  Muscle strength +5/5 in bilateral upper and lower extremities.  Sensation is grossly intact.  No focal deficit.   Assessment & Plan:

## 2011-12-12 NOTE — Assessment & Plan Note (Addendum)
HbA1c is way above goal today at 13.4.  She states she is using 26u of insulin at night.  Review of her CBGs does not reveal any hypoglycemia; her CBGs are averaging in the 300s.  Will increase her Lantus to 30u at night to improve CBG control.  Will have her return in 2-3 weeks to review CBGs and adjust insulin.  Approx 45 min with at least 50% face to face time spent counseling patient about her chronic medical conditions as well as formulating and discussing plan for continued management

## 2011-12-12 NOTE — Patient Instructions (Signed)
Schedule a follow up appointment with Dr. Arvilla Market on 2-3 weeks. START taking your ISOSORBIDE (Imdur) and your NORVSAC. TOPROL is a new medicine to help your heart and blood pressure.  Start taking this asap STOP taking Coreg. Trazodone is a medicine to help you sleep.. Start with one half of a tablet at night and increase to a full tablet if you need to. CHECK YOUR BLOOD PRESSURE AT HOME AND WRITE IT DOWN.  - Bring this with you to your next appointment. Increase your Lantus to 30u at night.  Bring your meter with you to your next appt.

## 2011-12-12 NOTE — Assessment & Plan Note (Addendum)
BP elevated above goal again today.  Pt states her BP always increases when she uses her walker.  She checks her BP at home and reports the top number is not in the 200s but can't remember the numbers. Her fill date for isosorbide was on 12/28 for #30 and she has one pill left in her bottle.  She states she hasn't had a ride to get rx.  She also has a full bottle of  Coreg #^0 filled on 12/28.  SHe also does not have lisinopril with her. She states the coreg causes her to have a headache so she decided to stop taking it; she can't remember the last time she took coreg.  Her h/a has not returned since she stopped taking coreg.    She denies chest pain, sob, and doe.  I am concerned that she is having increased difficulty managing her medications or that there is a barrier at home preventing her from getting/taking her medications that she is not comfortable discussing with me.  She declines any assistance from a case manager, [facility] Aide, or other in-home service to help with her medication.    Plan: - d/c coreg and start Toprol XL - Refills submitted for norvasc, imdur, and lisinopril - WIll have her return in 2 weeks to recheck BP - Pt asked to bring her medications dispenser and all meds with her to her next appt - Advised pt to call the clinic as soon as she develops any side effects from medications and not to wait or stop a medicine without letting her PCP know - Asked pt to bring her granddaughter or daughter with her to her next appt to review medications - Will check CMET today to assess renal function and electrolytes

## 2011-12-16 DIAGNOSIS — G4700 Insomnia, unspecified: Secondary | ICD-10-CM | POA: Insufficient documentation

## 2011-12-16 NOTE — Assessment & Plan Note (Signed)
Patient denies abnormal bleeding.  Will check CBC today

## 2011-12-16 NOTE — Assessment & Plan Note (Signed)
Patient continues to experience difficulty sleeping. She denies depressed mood or decreased enjoyment of her usual activities. Should not pick up her trazodone prescribed at last appointment. Discussed use of trazodone for treatment of her difficulty sleeping. Patient agrees to pick up his medication today. She's advised to call the clinic if she expresses any adverse effects. Reviewed proper sleep hygiene and stress the importance of using good sleep hygiene. I will have her followup in 2-3 weeks and at that time we'll assess her response to trazodone and evaluate for any potential side effects.

## 2011-12-29 ENCOUNTER — Encounter: Payer: Medicare Other | Admitting: Internal Medicine

## 2012-02-26 ENCOUNTER — Ambulatory Visit (INDEPENDENT_AMBULATORY_CARE_PROVIDER_SITE_OTHER): Payer: Medicare Other | Admitting: Internal Medicine

## 2012-02-26 ENCOUNTER — Encounter: Payer: Self-pay | Admitting: Internal Medicine

## 2012-02-26 ENCOUNTER — Inpatient Hospital Stay (HOSPITAL_COMMUNITY): Payer: Medicare Other

## 2012-02-26 ENCOUNTER — Ambulatory Visit (HOSPITAL_COMMUNITY)
Admission: RE | Admit: 2012-02-26 | Discharge: 2012-02-26 | Disposition: A | Discharge status: Home / Self Care | Payer: Medicare Other | Source: Ambulatory Visit

## 2012-02-26 ENCOUNTER — Inpatient Hospital Stay (HOSPITAL_COMMUNITY)
Admission: AD | Admit: 2012-02-26 | Discharge: 2012-02-27 | DRG: 202 | Disposition: A | Discharge status: Home / Self Care | Payer: Medicare Other | Source: Ambulatory Visit | Attending: Internal Medicine | Admitting: Internal Medicine

## 2012-02-26 ENCOUNTER — Encounter (HOSPITAL_COMMUNITY): Payer: Self-pay | Admitting: General Practice

## 2012-02-26 VITALS — BP 196/62 | HR 44 | Temp 99.4°F | Resp 20 | Ht 60.5 in | Wt 176.1 lb

## 2012-02-26 DIAGNOSIS — E119 Type 2 diabetes mellitus without complications: Secondary | ICD-10-CM

## 2012-02-26 DIAGNOSIS — G609 Hereditary and idiopathic neuropathy, unspecified: Secondary | ICD-10-CM | POA: Diagnosis present

## 2012-02-26 DIAGNOSIS — E785 Hyperlipidemia, unspecified: Secondary | ICD-10-CM | POA: Diagnosis present

## 2012-02-26 DIAGNOSIS — K3184 Gastroparesis: Secondary | ICD-10-CM | POA: Diagnosis present

## 2012-02-26 DIAGNOSIS — R0609 Other forms of dyspnea: Secondary | ICD-10-CM | POA: Insufficient documentation

## 2012-02-26 DIAGNOSIS — Z8673 Personal history of transient ischemic attack (TIA), and cerebral infarction without residual deficits: Secondary | ICD-10-CM

## 2012-02-26 DIAGNOSIS — J189 Pneumonia, unspecified organism: Secondary | ICD-10-CM

## 2012-02-26 DIAGNOSIS — R0602 Shortness of breath: Secondary | ICD-10-CM

## 2012-02-26 DIAGNOSIS — I499 Cardiac arrhythmia, unspecified: Secondary | ICD-10-CM

## 2012-02-26 DIAGNOSIS — R058 Other specified cough: Secondary | ICD-10-CM | POA: Insufficient documentation

## 2012-02-26 DIAGNOSIS — J209 Acute bronchitis, unspecified: Principal | ICD-10-CM | POA: Diagnosis present

## 2012-02-26 DIAGNOSIS — I498 Other specified cardiac arrhythmias: Secondary | ICD-10-CM

## 2012-02-26 DIAGNOSIS — R4182 Altered mental status, unspecified: Secondary | ICD-10-CM | POA: Insufficient documentation

## 2012-02-26 DIAGNOSIS — I509 Heart failure, unspecified: Secondary | ICD-10-CM

## 2012-02-26 DIAGNOSIS — I252 Old myocardial infarction: Secondary | ICD-10-CM

## 2012-02-26 DIAGNOSIS — J069 Acute upper respiratory infection, unspecified: Secondary | ICD-10-CM | POA: Insufficient documentation

## 2012-02-26 DIAGNOSIS — IMO0002 Reserved for concepts with insufficient information to code with codable children: Secondary | ICD-10-CM | POA: Insufficient documentation

## 2012-02-26 DIAGNOSIS — Z8249 Family history of ischemic heart disease and other diseases of the circulatory system: Secondary | ICD-10-CM

## 2012-02-26 DIAGNOSIS — R001 Bradycardia, unspecified: Secondary | ICD-10-CM | POA: Insufficient documentation

## 2012-02-26 DIAGNOSIS — R059 Cough, unspecified: Secondary | ICD-10-CM

## 2012-02-26 DIAGNOSIS — Z87891 Personal history of nicotine dependence: Secondary | ICD-10-CM

## 2012-02-26 DIAGNOSIS — E1149 Type 2 diabetes mellitus with other diabetic neurological complication: Secondary | ICD-10-CM | POA: Diagnosis present

## 2012-02-26 DIAGNOSIS — M810 Age-related osteoporosis without current pathological fracture: Secondary | ICD-10-CM | POA: Diagnosis present

## 2012-02-26 DIAGNOSIS — K219 Gastro-esophageal reflux disease without esophagitis: Secondary | ICD-10-CM | POA: Diagnosis present

## 2012-02-26 DIAGNOSIS — R05 Cough: Secondary | ICD-10-CM

## 2012-02-26 DIAGNOSIS — R9431 Abnormal electrocardiogram [ECG] [EKG]: Secondary | ICD-10-CM | POA: Insufficient documentation

## 2012-02-26 DIAGNOSIS — I2510 Atherosclerotic heart disease of native coronary artery without angina pectoris: Secondary | ICD-10-CM | POA: Diagnosis present

## 2012-02-26 DIAGNOSIS — I5032 Chronic diastolic (congestive) heart failure: Secondary | ICD-10-CM | POA: Diagnosis present

## 2012-02-26 DIAGNOSIS — N179 Acute kidney failure, unspecified: Secondary | ICD-10-CM | POA: Diagnosis present

## 2012-02-26 DIAGNOSIS — G4733 Obstructive sleep apnea (adult) (pediatric): Secondary | ICD-10-CM | POA: Diagnosis present

## 2012-02-26 DIAGNOSIS — Z794 Long term (current) use of insulin: Secondary | ICD-10-CM

## 2012-02-26 DIAGNOSIS — Z7982 Long term (current) use of aspirin: Secondary | ICD-10-CM

## 2012-02-26 DIAGNOSIS — I10 Essential (primary) hypertension: Secondary | ICD-10-CM | POA: Diagnosis present

## 2012-02-26 DIAGNOSIS — Z79899 Other long term (current) drug therapy: Secondary | ICD-10-CM

## 2012-02-26 DIAGNOSIS — E1165 Type 2 diabetes mellitus with hyperglycemia: Secondary | ICD-10-CM | POA: Insufficient documentation

## 2012-02-26 HISTORY — DX: Acute myocardial infarction, unspecified: I21.9

## 2012-02-26 HISTORY — DX: Pneumonia, unspecified organism: J18.9

## 2012-02-26 HISTORY — DX: Malignant neoplasm of uterus, part unspecified: C55

## 2012-02-26 HISTORY — DX: Headache, unspecified: R51.9

## 2012-02-26 HISTORY — DX: Angina pectoris, unspecified: I20.9

## 2012-02-26 HISTORY — DX: Reserved for inherently not codable concepts without codable children: IMO0001

## 2012-02-26 HISTORY — DX: Encounter for other specified aftercare: Z51.89

## 2012-02-26 HISTORY — DX: Headache: R51

## 2012-02-26 LAB — CBC WITH DIFFERENTIAL/PLATELET
Basophils Absolute: 0 10*3/uL (ref 0.0–0.1)
Basophils Relative: 0 % (ref 0–1)
HCT: 38.2 % (ref 36.0–46.0)
Hemoglobin: 12.9 g/dL (ref 12.0–15.0)
Lymphocytes Relative: 39 % (ref 12–46)
MCHC: 33.8 g/dL (ref 30.0–36.0)
Monocytes Absolute: 0.3 10*3/uL (ref 0.1–1.0)
Neutro Abs: 2.9 10*3/uL (ref 1.7–7.7)
Neutrophils Relative %: 54 % (ref 43–77)
RDW: 13.8 % (ref 11.5–15.5)
WBC: 5.5 10*3/uL (ref 4.0–10.5)

## 2012-02-26 LAB — COMPREHENSIVE METABOLIC PANEL
ALT: 10 U/L (ref 0–35)
Albumin: 3 g/dL — ABNORMAL LOW (ref 3.5–5.2)
BUN: 14 mg/dL (ref 6–23)
CO2: 25 mEq/L (ref 19–32)
Calcium: 9.3 mg/dL (ref 8.4–10.5)
Chloride: 101 mEq/L (ref 96–112)
Creat: 0.94 mg/dL (ref 0.50–1.10)
Potassium: 3.3 mEq/L — ABNORMAL LOW (ref 3.5–5.3)

## 2012-02-26 LAB — CARDIAC PANEL(CRET KIN+CKTOT+MB+TROPI)
Relative Index: 2.9 — ABNORMAL HIGH (ref 0.0–2.5)
Total CK: 103 U/L (ref 7–177)

## 2012-02-26 LAB — MRSA PCR SCREENING: MRSA by PCR: NEGATIVE

## 2012-02-26 LAB — GLUCOSE, CAPILLARY: Glucose-Capillary: 249 mg/dL — ABNORMAL HIGH (ref 70–99)

## 2012-02-26 MED ORDER — SENNOSIDES 8.6 MG PO TABS: 1.0000 | ORAL_TABLET | Freq: Every day | ORAL | Status: DC | PRN

## 2012-02-26 MED ORDER — ASPIRIN 81 MG PO TBEC: 81.0000 mg | DELAYED_RELEASE_TABLET | Freq: Every day | ORAL | Status: DC

## 2012-02-26 MED ORDER — ONDANSETRON HCL 4 MG PO TABS: 4.0000 mg | ORAL_TABLET | Freq: Four times a day (QID) | ORAL | Status: DC | PRN

## 2012-02-26 MED ORDER — SODIUM CHLORIDE 0.9 % IJ SOLN: 3.0000 mL | Freq: Two times a day (BID) | INTRAMUSCULAR | Status: DC

## 2012-02-26 MED ORDER — ALBUTEROL SULFATE HFA 108 (90 BASE) MCG/ACT IN AERS: 2.0000 | INHALATION_SPRAY | RESPIRATORY_TRACT | Status: DC | PRN

## 2012-02-26 MED ORDER — ONDANSETRON HCL 4 MG/2ML IJ SOLN: 4.0000 mg | Freq: Four times a day (QID) | INTRAMUSCULAR | Status: DC | PRN

## 2012-02-26 MED ORDER — POTASSIUM CHLORIDE CRYS ER 20 MEQ PO TBCR
40.0000 meq | EXTENDED_RELEASE_TABLET | Freq: Once | ORAL | Status: AC
  Administered 2012-02-26: 40 meq via ORAL
  Filled 2012-02-26: qty 2

## 2012-02-26 MED ORDER — ATORVASTATIN CALCIUM 40 MG PO TABS
40.0000 mg | ORAL_TABLET | Freq: Every day | ORAL | Status: DC
  Administered 2012-02-27: 40 mg via ORAL
  Filled 2012-02-26: qty 1

## 2012-02-26 MED ORDER — MAGNESIUM OXIDE 400 (241.3 MG) MG PO TABS
400.0000 mg | ORAL_TABLET | Freq: Two times a day (BID) | ORAL | Status: DC
  Administered 2012-02-26 – 2012-02-27 (×2): 400 mg via ORAL
  Filled 2012-02-26 (×3): qty 1

## 2012-02-26 MED ORDER — LORATADINE 10 MG PO TABS
10.0000 mg | ORAL_TABLET | Freq: Every day | ORAL | Status: DC
  Administered 2012-02-27: 10 mg via ORAL
  Filled 2012-02-26: qty 1

## 2012-02-26 MED ORDER — TRAZODONE 25 MG HALF TABLET
25.0000 mg | ORAL_TABLET | Freq: Every day | ORAL | Status: DC
  Administered 2012-02-26: 25 mg via ORAL
  Filled 2012-02-26 (×2): qty 1

## 2012-02-26 MED ORDER — INSULIN ASPART 100 UNIT/ML ~~LOC~~ SOLN
0.0000 [IU] | Freq: Three times a day (TID) | SUBCUTANEOUS | Status: DC
  Administered 2012-02-27: 5 [IU] via SUBCUTANEOUS
  Administered 2012-02-27: 3 [IU] via SUBCUTANEOUS
  Administered 2012-02-27: 7 [IU] via SUBCUTANEOUS

## 2012-02-26 MED ORDER — DEXTROSE 5 % IV SOLN
500.0000 mg | INTRAVENOUS | Status: DC
  Administered 2012-02-26: 500 mg via INTRAVENOUS
  Filled 2012-02-26 (×2): qty 500

## 2012-02-26 MED ORDER — ISOSORBIDE MONONITRATE ER 60 MG PO TB24
60.0000 mg | ORAL_TABLET | Freq: Every day | ORAL | Status: DC
  Administered 2012-02-27: 60 mg via ORAL
  Filled 2012-02-26: qty 1

## 2012-02-26 MED ORDER — NITROGLYCERIN 0.4 MG SL SUBL: 0.4000 mg | SUBLINGUAL_TABLET | SUBLINGUAL | Status: DC | PRN

## 2012-02-26 MED ORDER — ACETAMINOPHEN 325 MG PO TABS: 650.0000 mg | ORAL_TABLET | Freq: Four times a day (QID) | ORAL | Status: DC | PRN

## 2012-02-26 MED ORDER — HYDRALAZINE HCL 20 MG/ML IJ SOLN
10.0000 mg | INTRAMUSCULAR | Status: DC | PRN
  Administered 2012-02-26: 10 mg via INTRAVENOUS
  Filled 2012-02-26: qty 1

## 2012-02-26 MED ORDER — FERROUS SULFATE 325 (65 FE) MG PO TABS
325.0000 mg | ORAL_TABLET | Freq: Two times a day (BID) | ORAL | Status: DC
  Administered 2012-02-27 (×2): 325 mg via ORAL
  Filled 2012-02-26 (×4): qty 1

## 2012-02-26 MED ORDER — CALCIUM CARBONATE-VITAMIN D 500-200 MG-UNIT PO TABS
1.0000 | ORAL_TABLET | Freq: Three times a day (TID) | ORAL | Status: DC
  Administered 2012-02-26 – 2012-02-27 (×3): 1 via ORAL
  Filled 2012-02-26 (×5): qty 1

## 2012-02-26 MED ORDER — LISINOPRIL 40 MG PO TABS
40.0000 mg | ORAL_TABLET | Freq: Every day | ORAL | Status: DC
  Administered 2012-02-27: 40 mg via ORAL
  Filled 2012-02-26: qty 1

## 2012-02-26 MED ORDER — SENNA 8.6 MG PO TABS
1.0000 | ORAL_TABLET | Freq: Every day | ORAL | Status: DC | PRN
  Filled 2012-02-26: qty 2

## 2012-02-26 MED ORDER — INSULIN GLARGINE 100 UNIT/ML ~~LOC~~ SOLN
30.0000 [IU] | Freq: Every day | SUBCUTANEOUS | Status: DC
  Administered 2012-02-26: 30 [IU] via SUBCUTANEOUS

## 2012-02-26 MED ORDER — ENOXAPARIN SODIUM 40 MG/0.4ML ~~LOC~~ SOLN
40.0000 mg | SUBCUTANEOUS | Status: DC
  Administered 2012-02-26: 40 mg via SUBCUTANEOUS
  Filled 2012-02-26 (×2): qty 0.4

## 2012-02-26 MED ORDER — DEXTROSE 5 % IV SOLN
1.0000 g | INTRAVENOUS | Status: DC
  Administered 2012-02-26: 1 g via INTRAVENOUS
  Filled 2012-02-26 (×2): qty 10

## 2012-02-26 MED ORDER — ASPIRIN EC 81 MG PO TBEC
81.0000 mg | DELAYED_RELEASE_TABLET | Freq: Every day | ORAL | Status: DC
  Administered 2012-02-27: 81 mg via ORAL
  Filled 2012-02-26: qty 1

## 2012-02-26 NOTE — Assessment & Plan Note (Signed)
This may represent developing pneumonia (likely community-acquired) versus bronchitis. Will obtain chest x-ray for further evaluation.  Would recommend sputum Gram stain with culture. Consider empiric antibiotics to cover community-acquired and atypical organisms.  Will obtain blood cultures today.

## 2012-02-26 NOTE — Assessment & Plan Note (Signed)
Patient is experiencing increased SOB and DOE.  I believe this is likely related to her underlying respiratory infection in addition to her new bradycardia and irregular heart rhythm. Will admit her to the hospital for further workup and evaluation including chest x-ray, telemetry monitoring, cardiac enzymes, pro-BNP (although acute CHF exacerbation seems unlikely given lack of findings consistent with volume overloaded on exam), CMET, CBC, blood cultures, and additional work up per her admitting team.

## 2012-02-26 NOTE — Progress Notes (Signed)
cbg 454. Md notified no new orders received will continue to monitor.

## 2012-02-26 NOTE — Assessment & Plan Note (Signed)
Patient is currently fully alert and oriented exam. Family reports change in mentation (not oriented to date this a.m.) and changing behavioral patterns as outlined in history of present illness. Unclear if her altered mental status as a result of acute infection, other acute underlying process, or worsening underlying dementia acutely worsened by acute illness.  Work up as outlined above and per admitting team.

## 2012-02-26 NOTE — Progress Notes (Signed)
Pt BP 180/100 manually upon arrival to the floor.  MD notified. No new orders received. Will continue to monitor.  [name]

## 2012-02-26 NOTE — Progress Notes (Signed)
Subjective:    Patient ID: [name], female    DOB: 1933-05-13, 78 y.o.   MRN: [id number]  HPI  Pt reports cold for 1 week.  She reports sore throat, headache, earache, and cough.  She states she gags 2/2 severe coughing, no emesis.   Denies abdominal pain and diarrhea.   She describes her cough as productive of white/yellow phelgm.  She reports a "little fever" she cannot recall her max temp at home but remember a temp of 101 at some point over the past few days.  She is tried diabetic tussin with some relief.  She cannot recall any sick contacts.  She denies chest pain or palpitations.    Headache:  Pt states she has a very "bad headache" that worsens when she cough.  She notes her headache has progressively worsened over the past week.  She notes the first few days her h/a improved with cough medicine but has become constant.  She described her headache as located in the front of her head, that reaches a 10/10 at it's worst and is currently a 5/10.  She denies any visual changes, confusion.  She reports increased pain in her left shoulder and left knee as well as increased weakness in her left arm and leg; worse than usual weakness.  She admits to associated SOB after coughing.  She also reports increased DOE over the past week.  She feels very sleepy but continues to have problems with insomnia and also cannot sleep 2/2 cough.  Granddaughter is present with the patient and ask to speak with the outside of the exam room. She reports that this morning patient experienced an episode of altered mentation. Paranoid patient woke up at 7:30 AM and of shortness of breath and stated that "something in the house with burning her feet and legs."  Son is present and reports that this morning patient believed that "someone was trying to hurt her" and was visibly shaking.  He also reports that her daily pillboxes have remained full for the past week and he does not believe she has been appropriately  obtaining refills. This is very abnormal behavior for the patient. Additionally granddaughter reports that patient has been increasingly somnolent over the past few weeks and sleeping much more than usual throughout the day and also falling asleep abnormal he, i.e. during walls in conversation.  Review of Systems  12 point review of systems completed. Pertinent items in history of present illness. All other systems reviewed and negative. VItal signs reviewed and stable.      Objective:   Physical Exam GEN: No apparent distress.  Alert and oriented x 3.  Pleasant, conversant, and cooperative to exam. HEENT: head is autraumatic and normocephalic.  Neck is supple without palpable masses or lymphadenopathy.  Vision intact.  EOMI.  PERRLA.  Sclerae anicteric.  Conjunctivae without pallor or injection. Mucous membranes are moist.  Oropharynx is erythematous with mucoid drainage present in the posterior oropharynx. RESP:  Lungs are clear to ascultation bilaterally with good air movement.  No wheezes, ronchi, or rubs.  Patient is unable to speak in full sentences after coughing.  CARDIOVASCULAR:  Bradycardic (HR 43 on PE), irregular rhythm.   ABDOMEN: soft, non-tender, non-distended.  Bowels sounds present in all quadrants and normoactive.  No palpable masses. EXT: warm and dry.   No clubbing or cyanosis.  Trace edema in bilateral lower extremities. SKIN: warm and dry with normal turgor.  No rashes or abnormal lesions observed. Mental Status:  Alert, oriented, thought content appropriate.  Speech fluent without evidence of aphasia.  Able to follow 3 step commands without difficulty. Cranial Nerves: II: visual fields grossly normal, pupils equal, round, reactive to light and accommodation III,IV, VI: ptosis not present, extraocular muscles extra-ocular motions intact bilaterally V,VII: smile symmetric, facial light touch sensation normal bilaterally VIII: hearing normal bilaterally IX,X: uvula  midline XI: trapezius strength/neck flexion strength normal bilaterally XII: tongue strength normal  Motor: Right : Upper extremity    Left:     Upper extremity 5/5 deltoid       5/5 deltoid 5/5 tricep      5/5 tricep 5/5 biceps      5/5 biceps  5/5wrist flexion     5/5 wrist flexion 5/5 wrist extension     5/5 wrist extension 5/5 hand grip      5/5 hand grip  Lower extremity     Lower extremity 5/5 hip flexor      5/5 hip flexor 5/5 hip adductors     5/5 hip adductors 5/5 hip abductors     5/5 hip abductors 5/5 quadricep      5/5 quadriceps  5/5 plantar flexion       5/5 plantar flexion 5/5 plantar extension     5/5 plantar extension Tone and bulk:normal tone throughout; no atrophy noted Sensory:  light touch intact throughout, bilaterally       Assessment & Plan:

## 2012-02-26 NOTE — H&P (Signed)
Hospital Admission Note Date: 02/27/2012  Patient name: [name] Medical record number: [id number] Date of birth: 01-27-1933 Age: 78 y.o. Gender: female PCP: Nelda Bucks, MD, MD  Medical Service: [facility] medicine teaching service  Attending physician: Dr. Blanch Media    1st Contact: Dr. Dierdre Searles                Pager: [phone] 2nd Contact: Dr. Allena Katz    Pager: [phone]  After 5 pm or weekends: 1st Contact:      Pager: [phone] 2nd Contact:      Pager: [phone]  Chief Complaint:  Cough and shortness of breath  History of Present Illness: Patient is 78 year old woman with PMH of insulin-dependent DM, HTN, HLD, CAD with remote MI in 2006, CHF and OSA who presents with cough and shortness of breath.  Patient states that she started to have cold like symptoms including runny nose, sore throat, headache, earache and cough one week ago.  She describes her cough as productive of white/yellow Phelgm without blood noted. She states that she felt hot, cold/chills, sweating and body shaking at times.  She reports a "little fever" she cannot recall her max temp at home but remember a temp of 101 at some point over the past few days. She tried diabetic tussin with some relief.   She admits to associated SOB after coughing. She also reports increased DOE over the past week.  She used to sleep on 2 pillows during the night but required more than 3 pillows to sleep since last week.  She reports progressively worsening of shortness breath and she is unable to lie down at all for past couple days. Denies sick contacts. Denies any visual changes or confusion. Denies chest pain or palpitations  Her son and granddaughter were present with the patient in the room and asked to speak with me the outside of the room. They report that this morning patient experienced an episode of altered mentation. Patient was visibly shaking at 7:30 AM when she woke up and told her son that somebody was running  around her house and trying to harm her, which was abnormal behavior for the patient behavior.  Her daily pillboxes and was noted to have remained full for the past week and he does not believe she has been appropriately obtaining refills.    Meds: Medications Prior to Admission  Medication Sig Dispense Refill  . acetaminophen (TYLENOL) 325 MG tablet Take 650 mg by mouth every 6 (six) hours as needed. For pain.      Marland Kitchen albuterol (PROVENTIL HFA;VENTOLIN HFA) 108 (90 BASE) MCG/ACT inhaler Inhale 2 puffs into the lungs every 4 (four) hours as needed. For wheezing or shortness of breath.      Marland Kitchen amLODipine (NORVASC) 10 MG tablet Take 10 mg by mouth daily.      Marland Kitchen aspirin 81 MG EC tablet Take 81 mg by mouth daily.        . calcium-vitamin D (OSCAL WITH D) 500-200 MG-UNIT per tablet Take 1 tablet by mouth 3 (three) times daily.      . ferrous sulfate 325 (65 FE) MG tablet Take 325 mg by mouth 2 (two) times daily with a meal.      . insulin glargine (LANTUS) 100 UNIT/ML injection Inject 30 Units into the skin at bedtime.      . isosorbide mononitrate (IMDUR) 60 MG 24 hr tablet Take 60 mg by mouth daily.      Marland Kitchen lisinopril (PRINIVIL,ZESTRIL) 40 MG  tablet Take 40 mg by mouth daily.        Marland Kitchen loratadine (ALLERGY RELIEF) 10 MG tablet Take 10 mg by mouth daily.      . metoprolol succinate (TOPROL-XL) 25 MG 24 hr tablet Take 25 mg by mouth daily.      . nitroGLYCERIN (NITROSTAT) 0.4 MG SL tablet Place 0.4 mg under the tongue every 5 (five) minutes as needed. For chest pain.      . rosuvastatin (CRESTOR) 20 MG tablet Take 20 mg by mouth daily.      Marland Kitchen senna (SENOKOT) 8.6 MG tablet Take 1-2 tablets by mouth daily as needed. For constipation.      . traZODone (DESYREL) 50 MG tablet Take 25 mg by mouth at bedtime.      Marland Kitchen DISCONTD: albuterol (PROVENTIL HFA) 108 (90 BASE) MCG/ACT inhaler Inhale 2 puffs into the lungs every 4 (four) hours as needed for wheezing or shortness of breath. For wheezing or shortness of breath.   18 g  11  . DISCONTD: calcium-vitamin D (OSCAL WITH D 500-200) 500-200 MG-UNIT per tablet Take 1 tablet by mouth 3 (three) times daily.  90 tablet  6  . DISCONTD: ferrous sulfate 325 (65 FE) MG tablet Take 1 tablet (325 mg total) by mouth 2 (two) times daily with a meal.  60 tablet  3  . DISCONTD: insulin glargine (LANTUS) 100 UNIT/ML injection Inject 30 Units into the skin at bedtime.  10 mL  3  . DISCONTD: isosorbide mononitrate (IMDUR) 60 MG 24 hr tablet Take 1 tablet (60 mg total) by mouth daily.  30 tablet  11  . DISCONTD: metoprolol succinate (TOPROL XL) 25 MG 24 hr tablet Take 1 tablet (25 mg total) by mouth daily.  30 tablet  3  . DISCONTD: senna (SENOKOT) 8.6 MG tablet Take 1-2 tablets by mouth daily.  60 tablet  6  . DISCONTD: traZODone (DESYREL) 50 MG tablet Take 0.5 tablets (25 mg total) by mouth at bedtime.  30 tablet  1    Allergies: Allergies as of 02/26/2012 - Review Complete 02/26/2012  Allergen Reaction Noted  . Baking soda-fluoride (sodium fluoride) Nausea And Vomiting 03/28/2011  . Magnesium hydroxide Nausea And Vomiting 08/28/2011   Past Medical History  Diagnosis Date  . Asthma   . CHF (congestive heart failure)   . CAD (coronary artery disease)     Pt reports MI in 2006 (no documentation).  Cardiolite in 05/2002 and 07/2006 did not reveal any reversible ischemia.  Pt follows with [facility]. Clarene [facility] at [facility].  . CHF (congestive heart failure)     EF 25-30% with dilated LV, mild LVH, severe hypokinesis, and mod-severe reduction in RV function  . Osteoporosis   . HYPERTENSION 08/03/2006  . GASTROPARESIS, DIABETIC 08/03/2006  . HYPERLIPIDEMIA 08/03/2006  . OBSTRUCTIVE SLEEP APNEA 01/06/2008  . PERIPHERAL NEUROPATHY 08/03/2006  . GERD 08/03/2006  . LOW BACK PAIN, CHRONIC 08/03/2006  . OSTEOPOROSIS 03/21/2009  . CEREBRAL EMBOLISM, WITH INFARCTION 07/02/2010  . Angina   . Myocardial infarction "2 or 3"  . Pneumonia 02/26/12    "a few times; probably even today"  . Shortness of  breath     "all the time"  . DIABETES MELLITUS, TYPE II 11/04/1983  . Blood transfusion 08/2011  . Lower GI bleeding 08/2011  . Chronic daily headache   . Migraines   . Stroke summer 2011    "made my left hip worse"  . Uterine cancer    Past Surgical History  Procedure Date  . Esophagogastroduodenoscopy 08/26/2011    Procedure: ESOPHAGOGASTRODUODENOSCOPY (EGD);  Surgeon: Iva Boop, MD;  Location: [facility] ENDOSCOPY;  Service: Endoscopy;  Laterality: N/A;  . Colonoscopy 08/28/2011    Procedure: COLONOSCOPY;  Surgeon: Iva Boop, MD;  Location: [facility] ENDOSCOPY;  Service: Endoscopy;  Laterality: N/A;  . Vaginal hysterectomy   . Tubal ligation   . Cataract extraction w/ intraocular lens  implant, bilateral   . Toe surgery     "right big toe; operated on it to straighten it out; it was under"   Family History  Problem Relation Age of Onset  . Diabetes insipidus Mother   . Hypertension Mother    History   Social History  . Marital Status: Widowed    Spouse Name: N/A    Number of Children: N/A  . Years of Education: N/A   Occupational History  . Not on file.   Social History Main Topics  . Smoking status: Never Smoker   . Smokeless tobacco: Former Neurosurgeon    Types: Snuff    Quit date: 10/13/2006   Comment: 02/26/12 "stopped snuff 4-6 years ago"  . Alcohol Use: Yes     "stopped drinking alcohol ~ 1980's"  . Drug Use: No  . Sexually Active: No   Other Topics Concern  . Not on file   Social History Narrative   ** Merged History Encounter ** Lives with daughter    Review of Systems: Review of Systems:  Constitutional:   positive for fever, chills, diaphoresis, appetite change and fatigue.   HEENT:   positive for congestion, sore throat, rhinorrhea, sneezing.  Denies mouth sores, trouble swallowing, neck pain   Respiratory:   positive for SOB, DOE, cough,   Cardiovascular:  Denies palpitations and leg swelling.   Gastrointestinal:  Denies nausea, vomiting, abdominal  pain, diarrhea, constipation, blood in stool and abdominal distention.   Genitourinary:  Denies dysuria, urgency, frequency, hematuria, flank pain and difficulty urinating.   Musculoskeletal:  Denies myalgias, back pain, joint swelling, arthralgias and gait problem.   Skin:  Denies pallor, rash and wound.   Neurological:  Denies dizziness, seizures, syncope, weakness, light-headedness, numbness and headaches.    .    Physical Exam: Blood pressure 175/84, pulse 56, temperature 98 F (36.7 C), temperature source Oral, resp. rate 18, last menstrual period 12/19/1968, SpO2 95.00%. General: alert, well-developed, and cooperative to examination.  Head: normocephalic and atraumatic.  Eyes: vision grossly intact, pupils equal, pupils round, pupils reactive to light, no injection and anicteric.  Mouth: pharynx pink and moist, no erythema, and no exudates.  Neck: supple, full ROM, no thyromegaly, no JVD, and no carotid bruits.  Lungs: normal respiratory effort, no accessory muscle use, normal breath sounds, no crackles, and no wheezes. Heart: normal rate, irregular rhythm, no murmur, no gallop, and no rub.  Abdomen: soft, non-tender, normal bowel sounds, no distention, no guarding, no rebound tenderness, no hepatomegaly, and no splenomegaly.  Msk: no joint swelling, no joint warmth, and no redness over joints.  Pulses: 2+ DP/PT pulses bilaterally Extremities: No cyanosis, clubbing, edema Neurologic: alert & oriented X3, cranial nerves II-XII intact, strength normal in all extremities, sensation intact to light touch, and gait normal.  Skin: turgor normal and no rashes.  Psych: Oriented X3, memory intact for recent and remote, normally interactive, good eye contact, not anxious appearing, and not depressed appearing.  Lab results: Basic Metabolic Panel:  Basename 02/26/12 1814 02/26/12 1710  Samy Ryner -- 138  K -- 3.3*  CL -- 101  CO2 -- 25  GLUCOSE -- 252*  BUN -- 14  CREATININE -- 0.94  CALCIUM  -- 9.3  MG 1.9 --  PHOS -- --   Liver Function Tests:  Basename 02/26/12 1710  AST 14  ALT 10  ALKPHOS 113  BILITOT 0.2*  PROT 6.9  ALBUMIN 3.0*   CBC:  Basename 02/26/12 1710  WBC 5.5  NEUTROABS 2.9  HGB 12.9  HCT 38.2  MCV 85.3  PLT 287   Cardiac Enzymes:  Basename 02/26/12 2335 02/26/12 1814  CKTOTAL 96 103  CKMB 2.9 3.0  CKMBINDEX -- --  TROPONINI <0.30 <0.30   BNP:  Basename 02/26/12 1710  PROBNP 1808.0*   CBG:  Basename 02/26/12 1512  GLUCAP 249*   Urine Drug Screen: Drugs of Abuse     Component Value Date/Time   LABOPIA NONE DETECTED 07/10/2008 1721   COCAINSCRNUR NONE DETECTED 07/10/2008 1721   LABBENZ NONE DETECTED 07/10/2008 1721   AMPHETMU NONE DETECTED 07/10/2008 1721   THCU NONE DETECTED 07/10/2008 1721   LABBARB  Value: NONE DETECTED        DRUG SCREEN FOR MEDICAL PURPOSES ONLY.  IF CONFIRMATION IS NEEDED FOR ANY PURPOSE, NOTIFY LAB WITHIN 5 DAYS. 07/10/2008 1721    Other results: EKG: Sinus rhythm. Bigeminy   Assessment & Plan by Problem: # Cough and SOB  patient presents with one week of cold like symptoms with fever and chills.  She also reports progressively worsening of shortness of breath over past few days.This may represent developing pneumonia likely community acquired pneumonia versus bronchitis.    Acute exacerbation of her CHF cannot be completely ruled out given her history of CAD with remote MI.  Her echocardiogram initially showed EF 25-30% in 2006, and improved when she had EF of 55% to 60% with grade 1 diastolic dysfunction on repeat echocardiogram. She is at good volume status, no JVD, no rales or peripheral edema.  Thus, acute CHF is less likely.  - Will admit to telemetry - Blood cultures x2 -Pro BNP, CE x 3, repeat EKG -Sputum Gram stain and culture - Obtain chest x-ray for further evaluation - Will start empiric antibiotic treatment for CAP including Ceftriaxone and azithromycin  #Bradycardia She was reported to have  a bradycardia with a rate of 40s at clinic.  Her EKG was performed and showed consistent bigeminy which could cause the false reading of bradycardia on blood pressure machine at clinic.  Her bigeminy is an new finding comparing to her old EKG in November last year.  The etiology of her bigeminy is unclear and differential DDX are wide, including electrolyte imbalance secondary to decreased oral intake, recent cold like symptoms versus pneumonia, acute cardiac events like CHF or MI.  -Admit to telemetry  -optimize her potassium and magnesium  -Cardiac enzymes cycles x3 -Repeat EKG in a.m. - If she has persistent bigeminy by tomorrow morning, will likely consider cardiology consult.  # Hx of CHF - good volume status - continue BB, Nitrates, ACEI, statin  # DM, poorly controlled Last hemoglobin A1C was 13.4 on 12/12/11.  -Repeat her HGB A1C - DM educator - continue home insulin for now - SSI  # hypertension Stable, continue home regimen  # VTE: Lovenox   Signed: Jkayla Spiewak 02/27/2012, 6:41 AM

## 2012-02-26 NOTE — Assessment & Plan Note (Addendum)
Patient's bradycardia with new irregular rhythm is concerning and warrents hospitalization and further evaluation.   An is unclear if she has suffered a cardiac event or if this represents sick sinus syndrome or other etiology.  We'll contact admitting team and admit to telemetry. 12-lead EKG pending. Would consider cardiac enzymes to evaluate for recent MI.  Consider repeat echocardiography.

## 2012-02-27 ENCOUNTER — Other Ambulatory Visit: Payer: Self-pay | Admitting: Internal Medicine

## 2012-02-27 DIAGNOSIS — I10 Essential (primary) hypertension: Secondary | ICD-10-CM

## 2012-02-27 LAB — URINALYSIS, ROUTINE W REFLEX MICROSCOPIC
Glucose, UA: >1000 mg/dL — AB
Ketones, ur: NEGATIVE mg/dL
Nitrite: NEGATIVE
Specific Gravity, Urine: 1.028 (ref 1.005–1.030)
pH: 5 (ref 5.0–8.0)

## 2012-02-27 LAB — CBC
HCT: 36.9 % (ref 36.0–46.0)
Hemoglobin: 12.2 g/dL (ref 12.0–15.0)
MCH: 28.1 pg (ref 26.0–34.0)
RDW: 14 % (ref 11.5–15.5)
WBC: 3.7 10*3/uL — ABNORMAL LOW (ref 4.0–10.5)

## 2012-02-27 LAB — URINE MICROSCOPIC-ADD ON

## 2012-02-27 LAB — BASIC METABOLIC PANEL
BUN: 18 mg/dL (ref 6–23)
CO2: 27 mEq/L (ref 19–32)
CO2: 25 mEq/L (ref 19–32)
Calcium: 9.3 mg/dL (ref 8.4–10.5)
Chloride: 102 mEq/L (ref 96–112)
Chloride: 102 mEq/L (ref 96–112)
Creatinine, Ser: 1.28 mg/dL — ABNORMAL HIGH (ref 0.50–1.10)
GFR calc Af Amer: 47 mL/min — ABNORMAL LOW (ref >90)
Glucose, Bld: 313 mg/dL — ABNORMAL HIGH (ref 70–99)
Glucose, Bld: 291 mg/dL — ABNORMAL HIGH (ref 70–99)
Potassium: 3.7 mEq/L (ref 3.5–5.1)
Potassium: 3.9 mEq/L (ref 3.5–5.1)
Sodium: 137 mEq/L (ref 135–145)

## 2012-02-27 LAB — CARDIAC PANEL(CRET KIN+CKTOT+MB+TROPI)
CK, MB: 2.7 ng/mL (ref 0.3–4.0)
Relative Index: INVALID (ref 0.0–2.5)
Relative Index: INVALID (ref 0.0–2.5)
Total CK: 82 U/L (ref 7–177)
Troponin I: <0.3 ng/mL (ref <0.30)
Troponin I: <0.3 ng/mL (ref <0.30)

## 2012-02-27 LAB — GLUCOSE, CAPILLARY
Glucose-Capillary: 216 mg/dL — ABNORMAL HIGH (ref 70–99)
Glucose-Capillary: 377 mg/dL — ABNORMAL HIGH (ref 70–99)
Glucose-Capillary: 237 mg/dL — ABNORMAL HIGH (ref 70–99)
Glucose-Capillary: 328 mg/dL — ABNORMAL HIGH (ref 70–99)

## 2012-02-27 LAB — TSH: TSH: 1.773 u[IU]/mL (ref 0.350–4.500)

## 2012-02-27 MED ORDER — DOXYCYCLINE HYCLATE 100 MG PO TABS: 100.0000 mg | ORAL_TABLET | Freq: Two times a day (BID) | ORAL | Status: AC

## 2012-02-27 MED ORDER — MAGNESIUM OXIDE 400 (241.3 MG) MG PO TABS: 400.0000 mg | ORAL_TABLET | Freq: Two times a day (BID) | ORAL | Status: DC

## 2012-02-27 NOTE — Clinical Social Work Psychosocial (Signed)
     Clinical Social Work Department BRIEF PSYCHOSOCIAL ASSESSMENT 02/27/2012  Patient:  [name], [name]     Account Number:  [id number]     Admit date:  02/26/2012  Clinical Social Worker:  Doree Albee  Date/Time:  02/27/2012 09:30 AM  Referred by:  RN  Date Referred:  02/27/2012 Referred for  Advanced Directives   Other Referral:   Interview type:  Patient Other interview type:    PSYCHOSOCIAL DATA Living Status:  FAMILY Admitted from facility:   Level of care:   Primary support name:  Fransisca Connors Primary support relationship to patient:  CHILD, ADULT Degree of support available:   moderate    CURRENT CONCERNS Current Concerns  Other - See comment   Other Concerns:   advanced directives    SOCIAL WORK ASSESSMENT / PLAN CSW met with pt at bedside to discuss advanced directives with pt as requested by pt to staff. Pt, "I've never thought about anything like this, please explain it to me." CSW and pt discussed the role of a living will and health care power of attorney. Pt stated that she would like to think about the advanced directives further and talk with family.    CSW and Pt discussed notarizing advanced directives in the hosptial and in the community with a community notary.    Pt agreed to notify staff if she would like to complete advanced directives or had further questions after discussing iwth pt family. Please reconsult csw if pt requests more information or desire to complete advanced diretive   Assessment/plan status:  No Further Intervention Required Other assessment/ plan:   Information/referral to community resources:   Adavanced directives    PATIENTS/FAMILYS RESPONSE TO PLAN OF CARE: Pt appreciative of csw concern and support. Pt agreed to notify staff if pt had further questions or wanted to complete advanced directive. Pt plans to discuss with pt family. No further csw needs at this time, sign off.

## 2012-02-27 NOTE — Progress Notes (Addendum)
S: Patient only has mild nonproductive cough. No other c/o feels well want to go home. O: general: NAD      Lungs CTA B/L      Heart: RRR. Occasional PVC.       Abd: soft, BS x 4.       Ext: No edema A/P - will d/c home - her Cr level slightly elevated at 1.25 comparing to admission level of 0.94. She was noted to have Cr of 1-1.3 in the past.  >>>likly due to her decreased oral intake 2/2 to her URI. - will encourage her to increase her oral fluid intake - will ask her to come to the clinic for BMP on Monday, 03/01/12 and follow up with the clinic closely. - will discharge her with 10 day course of doxycycline for her URI.

## 2012-02-27 NOTE — Care Management Note (Signed)
    Page 1 of 1   02/27/2012     10:56:33 AM   CARE MANAGEMENT NOTE 02/27/2012  Patient:  [name], [name]   Account Number:  [id number]  Date Initiated:  02/27/2012  Documentation initiated by:  Donn Pierini  Subjective/Objective Assessment:   Pt admitted with ?PNA, SOB, bradycardia     Action/Plan:   PTA pt lived at home with children,  PT/OT evals ordered   Anticipated DC Date:  03/01/2012   Anticipated DC Plan:  HOME W HOME HEALTH SERVICES      DC Planning Services  CM consult      Choice offered to / List presented to:             Status of service:  In process, will continue to follow Medicare Important Message given?   (If response is "NO", the following Medicare IM given date fields will be blank) Date Medicare IM given:   Date Additional Medicare IM given:    Discharge Disposition:    Per UR Regulation:  Reviewed for med. necessity/level of care/duration of stay  If discussed at Long Length of Stay Meetings, dates discussed:    Comments:  PCP- Arvilla Market  02/27/12- 1055- Donn Pierini RN, BSN [phone] UR review completed, NCM to follow for potential d/c needs.

## 2012-02-27 NOTE — Evaluation (Signed)
Physical Therapy Evaluation Patient Details Name: [name] MRN: [id number] DOB: 06/06/1933 Today's Date: 02/27/2012 Time: 7829-5621 PT Time Calculation (min): 20 min  PT Assessment / Plan / Recommendation Clinical Impression  Patient presented to ED with cough and SOB. She appears at baseline and will have caregiver support from various family members at discharge. Patient does have chronic tremors ? etiology that give perception that patient is unsafe despite good stability with balance activities today.     PT Assessment  Patent does not need any further PT services    Follow Up Recommendations  No PT follow up    Barriers to Discharge  None      lEquipment Recommendations  None recommended by PT    Recommendations for Other Services  None   Frequency  N/A   Precautions / Restrictions Precautions Precautions: Fall   Pertinent Vitals/Pain VSS/ no reports of pain      Mobility  Bed Mobility Bed Mobility: Left Sidelying to Sit;Rolling Left;Sitting - Scoot to Edge of Bed;Sit to Supine Rolling Left: 7: Independent Left Sidelying to Sit: 7: Independent Sitting - Scoot to Edge of Bed: 7: Independent Sit to Supine: 7: Independent Transfers Transfers: Sit to Stand;Stand to Sit Sit to Stand: 7: Independent;From toilet;From bed Stand to Sit: 7: Independent;To toilet;To bed Details for Transfer Assistance: safe with all toilet hygiene including clothing management (underwear).  Ambulation/Gait Ambulation/Gait Assistance: 6: Modified independent (Device/Increase time) Ambulation Distance (Feet): 80 Feet Assistive device: Straight cane Ambulation/Gait Assistance Details: Patient appears at baseline. Mobility looks unsafe due to muscle fasiculations that cause tremor-like activity of legs in standing that make it appear that legs may buckle despite good stability. Discussed with patient that cane is too high but she declined adjustment. Gait Pattern: Step-through  pattern;Decreased stride length     Visit Information  Last PT Received On: 02/27/12    Subjective Data  Subjective: Patient reports using cane mostly at home and if feeling weaker the walker Patient Stated Goal: Home   Prior Functioning  Home Living Lives With: Son Available Help at Discharge: Family;Available PRN/intermittently Type of Home: House Home Access: Level entry Home Layout: One level Bathroom Shower/Tub: Engineer, manufacturing systems: Standard Home Adaptive Equipment: Paediatric nurse with back;Straight cane;Walker - rolling Prior Function Able to Take Stairs?: No Driving: No Vocation: Retired Musician: Clinical cytogeneticist  Overall Cognitive Status: Difficult to assess Difficult to assess due to: Hard of hearing/deaf Arousal/Alertness: Awake/alert Orientation Level: Appears intact for tasks assessed Behavior During Session: [facility] for tasks performed    Extremity/Trunk Assessment Right Lower Extremity Assessment RLE ROM/Strength/Tone: Within functional levels RLE Sensation: WFL - Light Touch;WFL - Proprioception RLE Coordination: Deficits RLE Coordination Deficits: Noted muscle fasiculations with movement and mobility resulting in persistent tremor-like movement affecting all extremities and trunk - patient states chronic Left Lower Extremity Assessment LLE ROM/Strength/Tone: Within functional levels LLE Sensation: WFL - Light Touch;WFL - Proprioception LLE Coordination: Deficits   Balance Balance Balance Assessed: Yes Dynamic Standing Balance Dynamic Standing - Balance Support: Right upper extremity supported Dynamic Standing - Level of Assistance: 6: Modified independent (Device/Increase time) Dynamic Standing - Balance Activities: Lateral lean/weight shifting;Forward lean/weight shifting;Reaching for objects Dynamic Standing - Comments: with toileting - uses cane for support and manages clothing with opposing hand  End of Session PT - End  of Session Equipment Utilized During Treatment: Gait belt Activity Tolerance: Patient tolerated treatment well Patient left: in bed;with call bell/phone within reach;with bed alarm set Nurse Communication:  Mobility status   Edwyna Perfect, [place]  Pager [phone]  02/27/2012, 11:08 AM

## 2012-02-27 NOTE — Progress Notes (Signed)
02/27/12  Spoke with patient about her diabetes.  Was diagnosed around 30 years ago.  Takes Lantus 30 units every night at bedtime.  Sees Dr. Arvilla Market in the [facility].  Checks CBGs twice a day-- states that they usually run 120-130 mg/dl.  States that CBGs started going up with this cough she has. Does own cooking at home.  Will need to be followed up by her PCP on discharge.  Will continue to follow while in hospital.  Smith Mince RN BSN

## 2012-02-27 NOTE — Discharge Instructions (Signed)
1. Follow up with our clinic on Tuesday next week 2. Please drop by the clinic for blood work on Monday, 03/01/12 3. Please stop your Lisinopril until your appt on Tuesday

## 2012-02-27 NOTE — H&P (Signed)
Internal Medicine Teaching Service Attending Note Date: 02/27/2012  Patient name: [name]  Medical record number: [id number]  Date of birth: Mar 11, 1933   I have seen and evaluated [name] and discussed their care with the Residency Team. Please see Dr Stevenson Clinch H&P for full details. I agree with the formulated Assessment and Plan with the following changes.  [name] has had URI / LRI sxs for one week, inc productive cough, fevers / chills, rhinorrhea, HA, sore throat, ear ache, and dyspnea. Her CXR was negative for infiltrate, no leukocytosis, and afebrile since admit. Her lungs are clear with good air movement. She was empirically started on ABX on admit. This AM, she feels much better - at baseline - although she is still coughing. We will narrow ABX to treat a presumed bacterial resp infxn.   Ventricular bigeminy - spontaneously resolved and now in sinus. Dr Dierdre Searles is repleting electrolytes and her acute illness could also contribute. [name] had a normal ECHO in 2012. She is already in a BB.  Confusion reported by the son and granddaughter. Drs Arvilla Market, Dierdre Searles, and myself were unable to detect any confusion. Even though she was ina strange environment last night and didn't get much sleep, no confusion was reported by nursing staff. Her recent resp infxn and arrythmia could contribute confusion. Dr Dierdre Searles will discuss with the family.   Her BP is elevated. Review of records indicated that BP had trended down to late 2012. She was D/C Nov 2012 and clonidine was D/C'd 2/2 GI bleed. BP trended up in 2013. Dr Arvilla Market indicated that some of the increased BP was 2/2 med non-compliance - had D/C'd her BB 2/2 HA. She made med changes. Will need outpt mgmt.   Acute on chronic renal insuff - baseline cr about 1.1 - 1.2. Admitted at 0.94 but today 1.25. Recheck later today.  If repeat creatinine stable, BP remains stable, and family OK with D/C, [name] will be D/C to home. [name] is agreeable to D/C today.

## 2012-02-27 NOTE — Progress Notes (Signed)
D/c orders received;IV removed with gauze on, pt remains in stable condition, pt meds and instruction reviewed and given to pt; pt d/c to home

## 2012-02-27 NOTE — Discharge Summary (Signed)
Internal Medicine Teaching [facility] Discharge Note  Name: [name] MRN: [id number] DOB: [birth year] 78 y.o.  Date of Admission: 02/26/2012  5:37 PM Date of Discharge: 02/27/2012 Attending Physician: Burns Spain, MD  Discharge Diagnosis: 1. Acute bronchitis 2. Ventricular bigeminy 3. Acute kidney injury 4. Diabetes mellitus, poorly controlled 5. Hypertension 6. history of congestive heart failure  Discharge Medications: Medication List  As of 02/27/2012  5:40 PM   STOP taking these medications         lisinopril 40 MG tablet         TAKE these medications         acetaminophen 325 MG tablet   Commonly known as: TYLENOL   Take 650 mg by mouth every 6 (six) hours as needed. For pain.      albuterol 108 (90 BASE) MCG/ACT inhaler   Commonly known as: PROVENTIL HFA;VENTOLIN HFA   Inhale 2 puffs into the lungs every 4 (four) hours as needed. For wheezing or shortness of breath.      ALLERGY RELIEF 10 MG tablet   Generic drug: loratadine   Take 10 mg by mouth daily.      amLODipine 10 MG tablet   Commonly known as: NORVASC   Take 10 mg by mouth daily.      aspirin 81 MG EC tablet   Take 81 mg by mouth daily.      calcium-vitamin D 500-200 MG-UNIT per tablet   Commonly known as: OSCAL WITH D   Take 1 tablet by mouth 3 (three) times daily.      doxycycline 100 MG tablet   Commonly known as: VIBRA-TABS   Take 1 tablet (100 mg total) by mouth 2 (two) times daily.      ferrous sulfate 325 (65 FE) MG tablet   Take 325 mg by mouth 2 (two) times daily with a meal.      insulin glargine 100 UNIT/ML injection   Commonly known as: LANTUS   Inject 30 Units into the skin at bedtime.      isosorbide mononitrate 60 MG 24 hr tablet   Commonly known as: IMDUR   Take 60 mg by mouth daily.      magnesium oxide 400 (241.3 MG) MG tablet   Commonly known as: MAG-OX   Take 1 tablet (400 mg total) by mouth 2 (two) times daily.      metoprolol succinate 25 MG 24  hr tablet   Commonly known as: TOPROL-XL   Take 25 mg by mouth daily.      nitroGLYCERIN 0.4 MG SL tablet   Commonly known as: NITROSTAT   Place 0.4 mg under the tongue every 5 (five) minutes as needed. For chest pain.      rosuvastatin 20 MG tablet   Commonly known as: CRESTOR   Take 20 mg by mouth daily.      senna 8.6 MG tablet   Commonly known as: SENOKOT   Take 1-2 tablets by mouth daily as needed. For constipation.      traZODone 50 MG tablet   Commonly known as: DESYREL   Take 25 mg by mouth at bedtime.            Disposition and follow-up:   [name] was discharged from [facility] in Stable condition.    Follow-up Appointments: Follow-up Information    Follow up with [facility], MD on 03/02/2012. (at 915 am)    Contact information:   [address]  [address] [place] [address] [phone]    1. Acute bronchitis.  She was discharged with 10 day course of doxycycline.  Please assess medicine compliance and continues resolution of her bronchitis. 2. ventricular bigeminy.  Resolved upon discharge.  Her magnesium was 1.9 and she was given 3 day supply of magnesium oxide.  Please check her magnesium at clinic (ordered already) 3. AKI: Baseline 1-1.2.  She had a mild elevated creatinine of 1.28 upon discharge, which likely due to decreased oral fluid intake associated with her acute respiratory illness.  She is encouraged to increase her oral fluid intake and her lisinopril was on hold upon discharge.  Please check her BMP (ordered). Her lisinopril should be resumed once her creatinine returns to her baseline. 4. Poor controlled diabetes with last A1c 13.4 on 12/12/11.  She should continue followup with her PCP and diabetic educator for optimal management of her diabetes as outpatient.             Discharge Orders    Future Appointments: Provider: Department: Dept Phone: Center:   03/02/2012 9:15 AM Melida Quitter, MD [facility]  Res [phone] [facility]      Consultations:  none  Procedures Performed:  Dg Chest 2 View  02/27/2012  *RADIOLOGY REPORT*  Clinical Data: Shortness of breath.  Hypertension.  Congestive heart failure.  CHEST - 2 VIEW  Comparison: 08/24/2011  Findings: Cardiomegaly noted without edema or vascular findings of pulmonary venous hypertension.  Mild atherosclerotic calcification of the aortic arch is present.  The lungs appear clear.  No pleural effusion is observed on the lateral projection.  IMPRESSION:  1.  Cardiomegaly.   Otherwise, no significant abnormality identified.  Original Report Authenticated By: Dellia Cloud, M.D.    Admission HPI:   Patient is 78 year old woman with PMH of insulin-dependent DM, HTN, HLD, CAD with remote MI in 2006, CHF and OSA who presents with cough and shortness of breath.  Patient states that she started to have cold like symptoms including runny nose, sore throat, headache, earache and cough one week ago. She describes her cough as productive of white/yellow Phelgm without blood noted. She states that she felt hot, cold/chills, sweating and body shaking at times. She reports a "little fever" she cannot recall her max temp at home but remember a temp of 101 at some point over the past few days. She tried diabetic tussin with some relief.  She admits to associated SOB after coughing. She also reports increased DOE over the past week.  She used to sleep on 2 pillows during the night but required more than 3 pillows to sleep since last week. She reports progressively worsening of shortness breath and she is unable to lie down at all for past couple days. Denies sick contacts. Denies any visual changes or confusion. Denies chest pain or palpitations  Her son and granddaughter were present with the patient in the room and asked to speak with me the outside of the room. They report that this morning patient experienced an episode of altered mentation. Patient was visibly  shaking at 7:30 AM when she woke up and told her son that somebody was running around her house and trying to harm her, which was abnormal behavior for the patient behavior. Her daily pillboxes and was noted to have remained full for the past week and he does not believe she has been appropriately obtaining refills.   Hospital Course by problem list: # Acute bronchitis   Patient presented  with one week history of cold like symptoms including rhinorrhea, headache, sore throat, ear ache, mild productive cough, dyspnea, fever and chills.  She remained afebrile since admission.  Her lungs were clear to auscultation without any rhonchi, wheezing or rales.  No leukocytosis was noted.  Her chest x-ray was unremarkable for any infiltrates.  She was empirically treated with ceftriaxone and azithromycin on admission.  And she felt much better in the following day although she still had a mild nonproductive cough.  She is stable upon discharge.  We will discharge her with doxycycline 10 day course for presumed bacterial bronchitis.   Of note, she was reported to be confused by her son and granddaughter in the morning of admission.  However, patient denied that she was confused.  And we were unable to detect any confusion during hospitalization. Her recent respiratory infection and arrhythmia could contribute to her confusion.  I discussed about findings with patient and her family.  # Ventricular bigeminy     Patient was reported to have "bradycardia"with a rate of 40s at clinic on the day of admission.  Her EKG was performed and showed consistent ventricular bigeminy which could cause a false reading of bradycardia on blood pressure machine at clinic.  Her ventricular bigeminy is a new finding compared to her old EKG in November of 2012.    Patient has been asymptomatic since admission.  She has had an 3 sets of negative cardiac enzymes with no ischemia findings on her EKG. The etiology of her ventricular bigeminy  is likely due to mild decreased blood electrolytes (K and Mg) secondary to her decreased oral intake and her acute illness.  Her ventricular bigeminy spontaneously resolved after replacement of potassium and magnesium during this admission. She is stable upon discharge.  She will be discharged home with 3 day supply of magnesium oxide.  We will repeat her BMP and magnesium as outpatient.  She should continue to followup with her PCP next week.   # Acute kidney injury, baseline 1-1.2    On admission, patient's creatinine was 0.94.  And she was noted to have mild elevated creatinine of 1.25 during the following day of admission.  Her repeated creatinine was 1.28. The etiology is likely due to her recent decreased oral fluid intake associated with her acute respiratory illness.  She is clinically stable and feels okay to go home.  She is instructed to increase her fluid intake. She's also instructed to stop taking her lisinopril until her appointment on Tuesday, 03/02/12.  We will continue to manage this problem as an outpatient.  She should resume her lisinopril once her creatinine returns to her baseline.   #History of CHF.    Patient has had a remote history of MI and her 2 D echo showed LVEF 30-40% with moderate diffuse LV hypokinesis in 2006. Her repeat a 2-D echo on 08/28/12 showed much improved LV function with LVEF 55-60% with no regional wall motion abnormalities.  She had good volume status during this admission.  We'll continue her beta blocker, nitrates, statin. Her ACEI was temporarily on hold due to mild creatinine elevation.  She will follow up with her PCP as an outpatient. And her ACEI should be resumed once her Cr returns to baseline.   # DM, poorly controlled     Last hemoglobin A1c was 13.4 on 12/12/11.  The hospital diabetic educator was able to talk to patient about management of her diabetes.  She should continue to followup with her  PCP as outpatient for optimal management of her  diabetes. She will also continue to followup with the clinic diabetic educator [name].  # Hypertension    Stable, continue home regimen except for lisinopril. We will hold lisinopril temporarily due to AKI.  She'll followup with her PCP as outpatient for continuous management.  Discharge Vitals:  BP 127/68  Pulse 77  Temp(Src) 97.9 F (36.6 C) (Oral)  Resp 16  SpO2 98%  LMP 12/19/1968  Discharge Labs:  Results for orders placed during the hospital encounter of 02/26/12 (from the past 24 hour(s))  CARDIAC PANEL(CRET KIN+CKTOT+MB+TROPI)     Status: Abnormal   Collection Time   02/26/12  6:14 PM      Component Value Range   Total CK 103  7 - 177 (U/L)   CK, MB 3.0  0.3 - 4.0 (ng/mL)   Troponin I <0.30  <0.30 (ng/mL)   Relative Index 2.9 (*) 0.0 - 2.5   MAGNESIUM     Status: Normal   Collection Time   02/26/12  6:14 PM      Component Value Range   Magnesium 1.9  1.5 - 2.5 (mg/dL)  GLUCOSE, CAPILLARY     Status: Abnormal   Collection Time   02/26/12  6:45 PM      Component Value Range   Glucose-Capillary 216 (*) 70 - 99 (mg/dL)   Comment 1 Notify RN    GLUCOSE, CAPILLARY     Status: Abnormal   Collection Time   02/26/12  7:54 PM      Component Value Range   Glucose-Capillary 377 (*) 70 - 99 (mg/dL)   Comment 1 Notify RN    MRSA PCR SCREENING     Status: Normal   Collection Time   02/26/12  8:31 PM      Component Value Range   MRSA by PCR NEGATIVE  NEGATIVE   GLUCOSE, CAPILLARY     Status: Abnormal   Collection Time   02/26/12 10:28 PM      Component Value Range   Glucose-Capillary 454 (*) 70 - 99 (mg/dL)   Comment 1 Notify RN    CARDIAC PANEL(CRET KIN+CKTOT+MB+TROPI)     Status: Normal   Collection Time   02/26/12 11:35 PM      Component Value Range   Total CK 96  7 - 177 (U/L)   CK, MB 2.9  0.3 - 4.0 (ng/mL)   Troponin I <0.30  <0.30 (ng/mL)   Relative Index RELATIVE INDEX IS INVALID  0.0 - 2.5   CBC     Status: Abnormal   Collection Time   02/27/12  6:16 AM       Component Value Range   WBC 3.7 (*) 4.0 - 10.5 (K/uL)   RBC 4.34  3.87 - 5.11 (MIL/uL)   Hemoglobin 12.2  12.0 - 15.0 (g/dL)   HCT 16.1  09.6 - 04.5 (%)   MCV 85.0  78.0 - 100.0 (fL)   MCH 28.1  26.0 - 34.0 (pg)   MCHC 33.1  30.0 - 36.0 (g/dL)   RDW 40.9  81.1 - 91.4 (%)   Platelets 281  150 - 400 (K/uL)  BASIC METABOLIC PANEL     Status: Abnormal   Collection Time   02/27/12  6:16 AM      Component Value Range   Sodium 138  135 - 145 (mEq/L)   Potassium 3.7  3.5 - 5.1 (mEq/L)   Chloride 102  96 - 112 (mEq/L)  CO2 27  19 - 32 (mEq/L)   Glucose, Bld 313 (*) 70 - 99 (mg/dL)   BUN 18  6 - 23 (mg/dL)   Creatinine, Ser 1.61 (*) 0.50 - 1.10 (mg/dL)   Calcium 9.3  8.4 - 09.6 (mg/dL)   GFR calc non Af Amer 41 (*) >90 (mL/min)   GFR calc Af Amer 47 (*) >90 (mL/min)  CARDIAC PANEL(CRET KIN+CKTOT+MB+TROPI)     Status: Normal   Collection Time   02/27/12  6:19 AM      Component Value Range   Total CK 82  7 - 177 (U/L)   CK, MB 2.7  0.3 - 4.0 (ng/mL)   Troponin I <0.30  <0.30 (ng/mL)   Relative Index RELATIVE INDEX IS INVALID  0.0 - 2.5   GLUCOSE, CAPILLARY     Status: Abnormal   Collection Time   02/27/12  7:37 AM      Component Value Range   Glucose-Capillary 237 (*) 70 - 99 (mg/dL)   Comment 1 Notify RN    TSH     Status: Normal   Collection Time   02/27/12  9:30 AM      Component Value Range   TSH 1.773  0.350 - 4.500 (uIU/mL)  BASIC METABOLIC PANEL     Status: Abnormal   Collection Time   02/27/12 11:38 AM      Component Value Range   Sodium 137  135 - 145 (mEq/L)   Potassium 3.9  3.5 - 5.1 (mEq/L)   Chloride 102  96 - 112 (mEq/L)   CO2 25  19 - 32 (mEq/L)   Glucose, Bld 291 (*) 70 - 99 (mg/dL)   BUN 18  6 - 23 (mg/dL)   Creatinine, Ser 0.45 (*) 0.50 - 1.10 (mg/dL)   Calcium 9.3  8.4 - 40.9 (mg/dL)   GFR calc non Af Amer 40 (*) >90 (mL/min)   GFR calc Af Amer 46 (*) >90 (mL/min)  GLUCOSE, CAPILLARY     Status: Abnormal   Collection Time   02/27/12 12:14 PM       Component Value Range   Glucose-Capillary 286 (*) 70 - 99 (mg/dL)   Comment 1 Notify RN    URINALYSIS, ROUTINE W REFLEX MICROSCOPIC     Status: Abnormal   Collection Time   02/27/12  3:47 PM      Component Value Range   Color, Urine YELLOW  YELLOW    APPearance CLOUDY (*) CLEAR    Specific Gravity, Urine 1.028  1.005 - 1.030    pH 5.0  5.0 - 8.0    Glucose, UA >1000 (*) NEGATIVE (mg/dL)   Hgb urine dipstick NEGATIVE  NEGATIVE    Bilirubin Urine SMALL (*) NEGATIVE    Ketones, ur NEGATIVE  NEGATIVE (mg/dL)   Protein, ur >811 (*) NEGATIVE (mg/dL)   Urobilinogen, UA 0.2  0.0 - 1.0 (mg/dL)   Nitrite NEGATIVE  NEGATIVE    Leukocytes, UA SMALL (*) NEGATIVE   URINE MICROSCOPIC-ADD ON     Status: Abnormal   Collection Time   02/27/12  3:47 PM      Component Value Range   Squamous Epithelial / LPF MANY (*) RARE    WBC, UA 11-20  <3 (WBC/hpf)   Bacteria, UA FEW (*) RARE    Casts GRANULAR CAST (*) NEGATIVE    Urine-Other MUCOUS PRESENT    GLUCOSE, CAPILLARY     Status: Abnormal   Collection Time   02/27/12  4:40 PM  Component Value Range   Glucose-Capillary 328 (*) 70 - 99 (mg/dL)   Comment 1 Notify RN      Signed: Neve Branscomb 02/27/2012, 5:40 PM   Time Spent on Discharge: 45 minutes

## 2012-02-27 NOTE — Progress Notes (Signed)
Utilization review completed.  

## 2012-02-27 NOTE — Progress Notes (Signed)
Inpatient Diabetes Program Recommendations  AACE/ADA: New Consensus Statement on Inpatient Glycemic Control (2009)  Target Ranges:  Prepandial:   less than 140 mg/dL      Peak postprandial:   less than 180 mg/dL (1-2 hours)      Critically ill patients:  140 - 180 mg/dL   Reason for Visit: CBGs greater than 180 mg/dl  Inpatient Diabetes Program Recommendations Correction (SSI): Increase Novolog to MODERATE scale TID and HS if CBGs continue greater than 180 mg/dl.  Note:Will need to followed up by PCP in [facility] clinic.

## 2012-03-01 ENCOUNTER — Telehealth: Payer: Self-pay | Admitting: Dietician

## 2012-03-01 NOTE — Telephone Encounter (Signed)
Called to be sure patient aware of appointment in am. Complains of cough. Reminded her to bring medicine bottles and blood glucose meters

## 2012-03-02 ENCOUNTER — Ambulatory Visit (INDEPENDENT_AMBULATORY_CARE_PROVIDER_SITE_OTHER): Payer: Medicare Other | Admitting: Internal Medicine

## 2012-03-02 ENCOUNTER — Encounter: Payer: Self-pay | Admitting: Internal Medicine

## 2012-03-02 VITALS — BP 130/66 | HR 67 | Temp 97.2°F | Ht 60.0 in | Wt 177.8 lb

## 2012-03-02 DIAGNOSIS — I10 Essential (primary) hypertension: Secondary | ICD-10-CM

## 2012-03-02 DIAGNOSIS — J069 Acute upper respiratory infection, unspecified: Secondary | ICD-10-CM

## 2012-03-02 DIAGNOSIS — N189 Chronic kidney disease, unspecified: Secondary | ICD-10-CM

## 2012-03-02 DIAGNOSIS — E119 Type 2 diabetes mellitus without complications: Secondary | ICD-10-CM

## 2012-03-02 LAB — BASIC METABOLIC PANEL
CO2: 26 mEq/L (ref 19–32)
Calcium: 9.5 mg/dL (ref 8.4–10.5)
Chloride: 100 mEq/L (ref 96–112)
Creat: 1.07 mg/dL (ref 0.50–1.10)
Glucose, Bld: 293 mg/dL — ABNORMAL HIGH (ref 70–99)
Sodium: 136 mEq/L (ref 135–145)

## 2012-03-02 NOTE — Patient Instructions (Signed)
Please take 30 units of lantus at bedtime. Please check your blood sugars at least twice a day.  Please follow up in 3 months.

## 2012-03-02 NOTE — Progress Notes (Signed)
Subjective:     Patient ID: [name], female   DOB: 1932/11/12, 78 y.o.   MRN: [id number]  HPI [name] is a 78 year old woman with pmh significant for DM, HTN, CAD, and Asthma who presents today for hospital follow up. The patient was recently admitted for acute bronchitis. She reports she is feeling a lot better, and denies shortness of breath. She has mild cough but denies chest pain, tightness, wheezing.   DM - She checks her CBGs inconsistently and when she does they are always greater than 200, 200-400 range. She reports only taking 20 units of lantus when she is instructed to take 30 per med list.   She has no other complaints or concerns today. She is here with her granddaughter who states her grandmother has been doing well post discharge. No changes in mental status reported.   Review of Systems  Constitutional: Negative for fever and fatigue.  Respiratory: Positive for cough. Negative for chest tightness, shortness of breath and wheezing.   Cardiovascular: Negative for chest pain.       Objective:   Physical Exam  Constitutional: She is oriented to person, place, and time. She appears well-developed.  HENT:  Head: Normocephalic and atraumatic.  Neck: Normal range of motion. Neck supple.  Cardiovascular: Normal rate.   No murmur heard.      Irregular rhythm  Pulmonary/Chest: Effort normal and breath sounds normal. She has no wheezes.  Abdominal: Soft. Bowel sounds are normal.  Neurological: She is alert and oriented to person, place, and time.

## 2012-03-02 NOTE — Assessment & Plan Note (Signed)
Lab Results  Component Value Date   NA 137 02/27/2012   K 3.9 02/27/2012   CL 102 02/27/2012   CO2 25 02/27/2012   BUN 18 02/27/2012   CREATININE 1.28* 02/27/2012   CREATININE 0.94 02/26/2012    BP Readings from Last 3 Encounters:  03/02/12 130/66  02/27/12 127/68  02/26/12 196/62    Assessment: Hypertension control:  controlled  Progress toward goals:  improved Barriers to meeting goals:  no barriers identified  Plan: Hypertension treatment:  continue current medications Will restart Lisinopril 5 mg for renal protection, if Cr returns to baseline

## 2012-03-02 NOTE — Assessment & Plan Note (Signed)
Seems to be at baseline, but ACE-I was discontinued. Will check if it has improved at all.  BMET    Component Value Date/Time   NA 137 02/27/2012 1138   K 3.9 02/27/2012 1138   CL 102 02/27/2012 1138   CO2 25 02/27/2012 1138   GLUCOSE 291* 02/27/2012 1138   BUN 18 02/27/2012 1138   CREATININE 1.28* 02/27/2012 1138   CREATININE 0.94 02/26/2012 1710   CALCIUM 9.3 02/27/2012 1138   GFRNONAA 40* 02/27/2012 1138   GFRAA 46* 02/27/2012 1138

## 2012-03-02 NOTE — Assessment & Plan Note (Signed)
Lab Results  Component Value Date   HGBA1C 12.2 03/02/2012   HGBA1C 8.8* 08/26/2011   CREATININE 1.28* 02/27/2012   CREATININE 0.94 02/26/2012   MICROALBUR 6.05* 07/12/2009   MICRALBCREAT 201.7* 07/12/2009   CHOL 206* 12/12/2011   HDL 90 12/12/2011   TRIG 64 10/18/1094    Last eye exam and foot exam:    Component Value Date/Time   HMDIABEYEEXA diabetic retinopathy 07/20/2009   HMDIABFOOTEX done 07/02/2010    Assessment: Diabetes control: not controlled Progress toward goals: deteriorated Barriers to meeting goals: lack of understanding of disease management  Plan: Diabetes treatment: continue current medications Patient was only taking 20 units of lantus but she was instructed to take 30 which is listed on her med list. CBGs are consistently over 200-400 when she does manage to check her blood sugars.  Refer to: none Instruction/counseling given: reminded to bring blood glucose meter & log to each visit and reminded to bring medications to each visit

## 2012-03-02 NOTE — Assessment & Plan Note (Signed)
Resolved, finishing doxycycline.

## 2012-03-04 LAB — CULTURE, BLOOD (SINGLE)
Organism ID, Bacteria: NO GROWTH
Organism ID, Bacteria: NO GROWTH

## 2012-03-15 ENCOUNTER — Other Ambulatory Visit: Payer: Self-pay | Admitting: *Deleted

## 2012-03-15 NOTE — Telephone Encounter (Signed)
Last refilled 02/28/12  Qty# 6 tabs.

## 2012-03-16 MED ORDER — NITROGLYCERIN 0.4 MG SL SUBL: 0.4000 mg | SUBLINGUAL_TABLET | SUBLINGUAL | Status: DC | PRN

## 2012-03-16 MED ORDER — MAGNESIUM OXIDE 400 (241.3 MG) MG PO TABS: 400.0000 mg | ORAL_TABLET | Freq: Two times a day (BID) | ORAL | Status: AC

## 2012-03-16 NOTE — Telephone Encounter (Signed)
Mag Oxide rx called to [facility] .

## 2012-04-26 NOTE — Addendum Note (Signed)
Addended by: Bufford Spikes on: 04/26/2012 01:19 PM   Modules accepted: Orders

## 2012-06-21 ENCOUNTER — Other Ambulatory Visit: Payer: Self-pay | Admitting: Internal Medicine

## 2012-06-21 DIAGNOSIS — E119 Type 2 diabetes mellitus without complications: Secondary | ICD-10-CM

## 2012-10-12 ENCOUNTER — Other Ambulatory Visit: Payer: Self-pay | Admitting: Internal Medicine

## 2012-11-12 ENCOUNTER — Encounter: Payer: Medicare Other | Admitting: Internal Medicine

## 2012-11-12 ENCOUNTER — Other Ambulatory Visit: Payer: Self-pay | Admitting: Internal Medicine

## 2013-02-07 ENCOUNTER — Other Ambulatory Visit: Payer: Self-pay | Admitting: Internal Medicine

## 2013-04-21 ENCOUNTER — Other Ambulatory Visit: Payer: Self-pay

## 2013-04-21 ENCOUNTER — Telehealth: Payer: Self-pay | Admitting: Internal Medicine

## 2013-04-21 NOTE — Telephone Encounter (Signed)
Spoke with patient this morning about scheduling an appt.  Patient states she has transferred care to another doctor.  I asked her the name of her new doctor she was unable to provide me with that information.  Asked patient to please inform her pharmacy of this change so future refills will not come to our practice.

## 2013-05-10 ENCOUNTER — Ambulatory Visit: Payer: Medicare Other | Admitting: Cardiology

## 2013-05-15 ENCOUNTER — Other Ambulatory Visit: Payer: Self-pay | Admitting: Internal Medicine

## 2013-07-22 ENCOUNTER — Other Ambulatory Visit: Payer: Self-pay | Admitting: Internal Medicine

## 2013-08-04 ENCOUNTER — Other Ambulatory Visit: Payer: Self-pay | Admitting: Internal Medicine

## 2013-12-03 ENCOUNTER — Inpatient Hospital Stay (HOSPITAL_COMMUNITY)
Admission: EM | Admit: 2013-12-03 | Discharge: 2013-12-05 | DRG: 293 | Disposition: A | Discharge status: Home / Self Care | Payer: Medicare Other | Attending: Internal Medicine | Admitting: Internal Medicine

## 2013-12-03 ENCOUNTER — Emergency Department (HOSPITAL_COMMUNITY): Payer: Medicare Other

## 2013-12-03 ENCOUNTER — Encounter (HOSPITAL_COMMUNITY): Payer: Self-pay | Admitting: Emergency Medicine

## 2013-12-03 DIAGNOSIS — Z794 Long term (current) use of insulin: Secondary | ICD-10-CM

## 2013-12-03 DIAGNOSIS — E119 Type 2 diabetes mellitus without complications: Secondary | ICD-10-CM

## 2013-12-03 DIAGNOSIS — N179 Acute kidney failure, unspecified: Secondary | ICD-10-CM | POA: Diagnosis not present

## 2013-12-03 DIAGNOSIS — I509 Heart failure, unspecified: Secondary | ICD-10-CM | POA: Diagnosis present

## 2013-12-03 DIAGNOSIS — M545 Low back pain, unspecified: Secondary | ICD-10-CM | POA: Diagnosis present

## 2013-12-03 DIAGNOSIS — E785 Hyperlipidemia, unspecified: Secondary | ICD-10-CM | POA: Diagnosis present

## 2013-12-03 DIAGNOSIS — IMO0002 Reserved for concepts with insufficient information to code with codable children: Secondary | ICD-10-CM | POA: Diagnosis present

## 2013-12-03 DIAGNOSIS — E1149 Type 2 diabetes mellitus with other diabetic neurological complication: Secondary | ICD-10-CM

## 2013-12-03 DIAGNOSIS — I252 Old myocardial infarction: Secondary | ICD-10-CM

## 2013-12-03 DIAGNOSIS — Z7982 Long term (current) use of aspirin: Secondary | ICD-10-CM

## 2013-12-03 DIAGNOSIS — I2510 Atherosclerotic heart disease of native coronary artery without angina pectoris: Secondary | ICD-10-CM | POA: Diagnosis present

## 2013-12-03 DIAGNOSIS — I5031 Acute diastolic (congestive) heart failure: Secondary | ICD-10-CM | POA: Diagnosis present

## 2013-12-03 DIAGNOSIS — I10 Essential (primary) hypertension: Secondary | ICD-10-CM | POA: Diagnosis present

## 2013-12-03 DIAGNOSIS — D649 Anemia, unspecified: Secondary | ICD-10-CM

## 2013-12-03 DIAGNOSIS — I5033 Acute on chronic diastolic (congestive) heart failure: Principal | ICD-10-CM | POA: Diagnosis present

## 2013-12-03 DIAGNOSIS — Z8673 Personal history of transient ischemic attack (TIA), and cerebral infarction without residual deficits: Secondary | ICD-10-CM

## 2013-12-03 DIAGNOSIS — E1165 Type 2 diabetes mellitus with hyperglycemia: Secondary | ICD-10-CM | POA: Diagnosis present

## 2013-12-03 DIAGNOSIS — R413 Other amnesia: Secondary | ICD-10-CM | POA: Diagnosis present

## 2013-12-03 DIAGNOSIS — G4733 Obstructive sleep apnea (adult) (pediatric): Secondary | ICD-10-CM | POA: Diagnosis present

## 2013-12-03 DIAGNOSIS — D500 Iron deficiency anemia secondary to blood loss (chronic): Secondary | ICD-10-CM | POA: Diagnosis present

## 2013-12-03 DIAGNOSIS — N189 Chronic kidney disease, unspecified: Secondary | ICD-10-CM | POA: Diagnosis present

## 2013-12-03 DIAGNOSIS — I129 Hypertensive chronic kidney disease with stage 1 through stage 4 chronic kidney disease, or unspecified chronic kidney disease: Secondary | ICD-10-CM | POA: Diagnosis present

## 2013-12-03 DIAGNOSIS — Z8542 Personal history of malignant neoplasm of other parts of uterus: Secondary | ICD-10-CM

## 2013-12-03 DIAGNOSIS — J45909 Unspecified asthma, uncomplicated: Secondary | ICD-10-CM | POA: Diagnosis present

## 2013-12-03 DIAGNOSIS — G609 Hereditary and idiopathic neuropathy, unspecified: Secondary | ICD-10-CM | POA: Diagnosis present

## 2013-12-03 DIAGNOSIS — G8929 Other chronic pain: Secondary | ICD-10-CM | POA: Diagnosis present

## 2013-12-03 DIAGNOSIS — K219 Gastro-esophageal reflux disease without esophagitis: Secondary | ICD-10-CM | POA: Diagnosis present

## 2013-12-03 DIAGNOSIS — IMO0001 Reserved for inherently not codable concepts without codable children: Secondary | ICD-10-CM | POA: Diagnosis present

## 2013-12-03 DIAGNOSIS — J811 Chronic pulmonary edema: Secondary | ICD-10-CM | POA: Insufficient documentation

## 2013-12-03 DIAGNOSIS — M810 Age-related osteoporosis without current pathological fracture: Secondary | ICD-10-CM | POA: Diagnosis present

## 2013-12-03 DIAGNOSIS — Z79899 Other long term (current) drug therapy: Secondary | ICD-10-CM

## 2013-12-03 LAB — COMPREHENSIVE METABOLIC PANEL
ALT: 15 U/L (ref 0–35)
AST: 25 U/L (ref 0–37)
Albumin: 3 g/dL — ABNORMAL LOW (ref 3.5–5.2)
Alkaline Phosphatase: 110 U/L (ref 39–117)
BILIRUBIN TOTAL: 0.6 mg/dL (ref 0.3–1.2)
BUN: 20 mg/dL (ref 6–23)
CHLORIDE: 93 meq/L — AB (ref 96–112)
CO2: 22 meq/L (ref 19–32)
Calcium: 8.9 mg/dL (ref 8.4–10.5)
Creatinine, Ser: 1.09 mg/dL (ref 0.50–1.10)
GFR calc Af Amer: 55 mL/min — ABNORMAL LOW (ref >90)
GFR, EST NON AFRICAN AMERICAN: 47 mL/min — AB (ref >90)
Glucose, Bld: 578 mg/dL (ref 70–99)
Potassium: 4.1 mEq/L (ref 3.7–5.3)
SODIUM: 132 meq/L — AB (ref 137–147)
Total Protein: 7.7 g/dL (ref 6.0–8.3)

## 2013-12-03 LAB — CBC WITH DIFFERENTIAL/PLATELET
BASOS ABS: 0 10*3/uL (ref 0.0–0.1)
Basophils Relative: 0 % (ref 0–1)
Eosinophils Absolute: 0.1 10*3/uL (ref 0.0–0.7)
Eosinophils Relative: 1 % (ref 0–5)
HEMATOCRIT: 31.8 % — AB (ref 36.0–46.0)
Hemoglobin: 10.3 g/dL — ABNORMAL LOW (ref 12.0–15.0)
LYMPHS PCT: 18 % (ref 12–46)
Lymphs Abs: 1.5 10*3/uL (ref 0.7–4.0)
MCH: 25.4 pg — ABNORMAL LOW (ref 26.0–34.0)
MCHC: 32.4 g/dL (ref 30.0–36.0)
MCV: 78.3 fL (ref 78.0–100.0)
MONO ABS: 0.5 10*3/uL (ref 0.1–1.0)
Monocytes Relative: 6 % (ref 3–12)
NEUTROS ABS: 5.9 10*3/uL (ref 1.7–7.7)
Neutrophils Relative %: 75 % (ref 43–77)
PLATELETS: 273 10*3/uL (ref 150–400)
RBC: 4.06 MIL/uL (ref 3.87–5.11)
RDW: 16 % — AB (ref 11.5–15.5)
WBC: 7.9 10*3/uL (ref 4.0–10.5)

## 2013-12-03 LAB — PRO B NATRIURETIC PEPTIDE: Pro B Natriuretic peptide (BNP): 7767 pg/mL — ABNORMAL HIGH (ref 0–450)

## 2013-12-03 LAB — I-STAT TROPONIN, ED: Troponin i, poc: 0.09 ng/mL (ref 0.00–0.08)

## 2013-12-03 LAB — MRSA PCR SCREENING: MRSA by PCR: NEGATIVE

## 2013-12-03 LAB — GLUCOSE, CAPILLARY: Glucose-Capillary: 357 mg/dL — ABNORMAL HIGH (ref 70–99)

## 2013-12-03 MED ORDER — NITROGLYCERIN 0.4 MG SL SUBL: 0.4000 mg | SUBLINGUAL_TABLET | SUBLINGUAL | Status: DC | PRN

## 2013-12-03 MED ORDER — CALCIUM CARBONATE-VITAMIN D 500-200 MG-UNIT PO TABS
1.0000 | ORAL_TABLET | Freq: Three times a day (TID) | ORAL | Status: DC
  Administered 2013-12-03 – 2013-12-05 (×6): 1 via ORAL
  Filled 2013-12-03 (×7): qty 1

## 2013-12-03 MED ORDER — LISINOPRIL 5 MG PO TABS
5.0000 mg | ORAL_TABLET | Freq: Every day | ORAL | Status: DC
  Administered 2013-12-04 – 2013-12-05 (×2): 5 mg via ORAL
  Filled 2013-12-03 (×2): qty 1

## 2013-12-03 MED ORDER — INSULIN ASPART 100 UNIT/ML ~~LOC~~ SOLN
0.0000 [IU] | Freq: Three times a day (TID) | SUBCUTANEOUS | Status: DC
  Administered 2013-12-04: 2 [IU] via SUBCUTANEOUS
  Administered 2013-12-04: 3 [IU] via SUBCUTANEOUS
  Administered 2013-12-05: 2 [IU] via SUBCUTANEOUS
  Administered 2013-12-05: 5 [IU] via SUBCUTANEOUS

## 2013-12-03 MED ORDER — POTASSIUM CHLORIDE CRYS ER 20 MEQ PO TBCR
20.0000 meq | EXTENDED_RELEASE_TABLET | Freq: Two times a day (BID) | ORAL | Status: AC
  Administered 2013-12-03 – 2013-12-05 (×4): 20 meq via ORAL
  Filled 2013-12-03 (×4): qty 1

## 2013-12-03 MED ORDER — ACETAMINOPHEN 325 MG PO TABS: 650.0000 mg | ORAL_TABLET | ORAL | Status: DC | PRN

## 2013-12-03 MED ORDER — INSULIN ASPART 100 UNIT/ML ~~LOC~~ SOLN
14.0000 [IU] | Freq: Once | SUBCUTANEOUS | Status: AC
  Administered 2013-12-03: 14 [IU] via SUBCUTANEOUS
  Filled 2013-12-03: qty 1

## 2013-12-03 MED ORDER — ATORVASTATIN CALCIUM 40 MG PO TABS
40.0000 mg | ORAL_TABLET | Freq: Every day | ORAL | Status: DC
  Administered 2013-12-04 – 2013-12-05 (×2): 40 mg via ORAL
  Filled 2013-12-03 (×2): qty 1

## 2013-12-03 MED ORDER — ASPIRIN 81 MG PO TBEC: 81.0000 mg | DELAYED_RELEASE_TABLET | Freq: Every day | ORAL | Status: DC

## 2013-12-03 MED ORDER — ONDANSETRON HCL 4 MG/2ML IJ SOLN
4.0000 mg | Freq: Four times a day (QID) | INTRAMUSCULAR | Status: DC | PRN
  Administered 2013-12-04: 4 mg via INTRAVENOUS
  Filled 2013-12-03: qty 2

## 2013-12-03 MED ORDER — SENNOSIDES 8.6 MG PO TABS: 1.0000 | ORAL_TABLET | Freq: Every evening | ORAL | Status: DC | PRN

## 2013-12-03 MED ORDER — LORATADINE 10 MG PO TABS
10.0000 mg | ORAL_TABLET | Freq: Every day | ORAL | Status: DC
  Administered 2013-12-03 – 2013-12-05 (×3): 10 mg via ORAL
  Filled 2013-12-03 (×3): qty 1

## 2013-12-03 MED ORDER — FERROUS SULFATE 325 (65 FE) MG PO TABS
325.0000 mg | ORAL_TABLET | Freq: Two times a day (BID) | ORAL | Status: DC
  Administered 2013-12-04 – 2013-12-05 (×4): 325 mg via ORAL
  Filled 2013-12-03 (×5): qty 1

## 2013-12-03 MED ORDER — INSULIN GLARGINE 100 UNIT/ML ~~LOC~~ SOLN
32.0000 [IU] | Freq: Every day | SUBCUTANEOUS | Status: DC
  Administered 2013-12-03 – 2013-12-04 (×2): 32 [IU] via SUBCUTANEOUS
  Filled 2013-12-03 (×4): qty 0.32

## 2013-12-03 MED ORDER — ALBUTEROL SULFATE HFA 108 (90 BASE) MCG/ACT IN AERS: 2.0000 | INHALATION_SPRAY | Freq: Four times a day (QID) | RESPIRATORY_TRACT | Status: DC | PRN

## 2013-12-03 MED ORDER — ALBUTEROL SULFATE (2.5 MG/3ML) 0.083% IN NEBU: 2.5000 mg | INHALATION_SOLUTION | Freq: Four times a day (QID) | RESPIRATORY_TRACT | Status: DC | PRN

## 2013-12-03 MED ORDER — TRAZODONE 25 MG HALF TABLET
25.0000 mg | ORAL_TABLET | Freq: Every day | ORAL | Status: DC
  Administered 2013-12-03 – 2013-12-04 (×2): 25 mg via ORAL
  Filled 2013-12-03 (×3): qty 1

## 2013-12-03 MED ORDER — ENOXAPARIN SODIUM 30 MG/0.3ML ~~LOC~~ SOLN
30.0000 mg | SUBCUTANEOUS | Status: DC
  Administered 2013-12-03: 30 mg via SUBCUTANEOUS
  Filled 2013-12-03 (×2): qty 0.3

## 2013-12-03 MED ORDER — FUROSEMIDE 10 MG/ML IJ SOLN
40.0000 mg | Freq: Once | INTRAMUSCULAR | Status: AC
  Administered 2013-12-03: 40 mg via INTRAVENOUS
  Filled 2013-12-03: qty 4

## 2013-12-03 MED ORDER — SODIUM CHLORIDE 0.9 % IJ SOLN: 3.0000 mL | INTRAMUSCULAR | Status: DC | PRN

## 2013-12-03 MED ORDER — SODIUM CHLORIDE 0.9 % IJ SOLN
3.0000 mL | Freq: Two times a day (BID) | INTRAMUSCULAR | Status: DC
  Administered 2013-12-03 – 2013-12-05 (×4): 3 mL via INTRAVENOUS

## 2013-12-03 MED ORDER — NITROGLYCERIN 2 % TD OINT
1.0000 [in_us] | TOPICAL_OINTMENT | Freq: Once | TRANSDERMAL | Status: AC
  Administered 2013-12-03: 1 [in_us] via TOPICAL
  Filled 2013-12-03: qty 1

## 2013-12-03 MED ORDER — ISOSORBIDE MONONITRATE ER 60 MG PO TB24
60.0000 mg | ORAL_TABLET | Freq: Every day | ORAL | Status: DC
  Administered 2013-12-04 – 2013-12-05 (×2): 60 mg via ORAL
  Filled 2013-12-03 (×2): qty 1

## 2013-12-03 MED ORDER — FUROSEMIDE 10 MG/ML IJ SOLN
40.0000 mg | Freq: Two times a day (BID) | INTRAMUSCULAR | Status: DC
  Administered 2013-12-03: 40 mg via INTRAVENOUS
  Filled 2013-12-03 (×3): qty 4

## 2013-12-03 MED ORDER — SODIUM CHLORIDE 0.9 % IV SOLN: 250.0000 mL | INTRAVENOUS | Status: DC | PRN

## 2013-12-03 MED ORDER — SENNA 8.6 MG PO TABS
1.0000 | ORAL_TABLET | Freq: Every evening | ORAL | Status: DC | PRN
Start: 2013-12-03 — End: 2013-12-05
  Filled 2013-12-03: qty 1

## 2013-12-03 MED ORDER — ASPIRIN EC 81 MG PO TBEC
81.0000 mg | DELAYED_RELEASE_TABLET | Freq: Every day | ORAL | Status: DC
  Administered 2013-12-03 – 2013-12-05 (×3): 81 mg via ORAL
  Filled 2013-12-03 (×3): qty 1

## 2013-12-03 MED ORDER — INSULIN ASPART 100 UNIT/ML ~~LOC~~ SOLN
0.0000 [IU] | Freq: Every day | SUBCUTANEOUS | Status: DC
  Administered 2013-12-03: 5 [IU] via SUBCUTANEOUS
  Administered 2013-12-04: 2 [IU] via SUBCUTANEOUS

## 2013-12-03 MED ORDER — METOPROLOL SUCCINATE ER 25 MG PO TB24
25.0000 mg | ORAL_TABLET | Freq: Every day | ORAL | Status: DC
  Administered 2013-12-03 – 2013-12-05 (×3): 25 mg via ORAL
  Filled 2013-12-03 (×3): qty 1

## 2013-12-03 MED ORDER — AMLODIPINE BESYLATE 10 MG PO TABS
10.0000 mg | ORAL_TABLET | Freq: Every day | ORAL | Status: DC
  Administered 2013-12-03 – 2013-12-05 (×3): 10 mg via ORAL
  Filled 2013-12-03 (×3): qty 1

## 2013-12-03 NOTE — ED Notes (Signed)
Pt presents to department for evaluation of SOB. History of CHF, hasn't taken Lasix x2 weeks. 85% RA per EMS. Received x2 nebulizer treatments per EMS. CPAP upon arrival per EMS. Pt denies pain. Rhonchi heard throughout all lung fields. Pt is alert and able to answer questions appropriately.

## 2013-12-03 NOTE — ED Notes (Signed)
Dr Winfred Leeds informed of troponin results .09

## 2013-12-03 NOTE — ED Provider Notes (Signed)
CSN: [id number]     Arrival date & time 12/03/13  1758 History   First MD Initiated Contact with Patient 12/03/13 1925     Chief Complaint  Patient presents with  . Shortness of Breath     (Consider location/radiation/quality/duration/timing/severity/associated sxs/prior Treatment) HPI Place of cough and shortness of breath onset possibly one month ago. Brought by EMS. EMS called today because patient could not walk across the room without becoming more dyspneic which is way worse than her baseline. EMS treated patient with BiPAP. No associated fever. Cough is non-productive. No other associated symptoms. Denies chest pain. Admits to two-pillow orthopnea. Daughter states she's been taking none of her medicines for the past few weeks. Past Medical History  Diagnosis Date  . Asthma   . CHF (congestive heart failure)   . CAD (coronary artery disease)     Pt reports MI in 2006 (no documentation).  Cardiolite in 05/2002 and 07/2006 did not reveal any reversible ischemia.  Pt follows with Dr. Rex Kras at [facility].  . CHF (congestive heart failure)     EF 25-30% with dilated LV, mild LVH, severe hypokinesis, and mod-severe reduction in RV function  . Osteoporosis   . HYPERTENSION 08/03/2006  . GASTROPARESIS, DIABETIC 08/03/2006  . HYPERLIPIDEMIA 08/03/2006  . OBSTRUCTIVE SLEEP APNEA 01/06/2008  . PERIPHERAL NEUROPATHY 08/03/2006  . GERD 08/03/2006  . LOW BACK PAIN, CHRONIC 08/03/2006  . OSTEOPOROSIS 03/21/2009  . CEREBRAL EMBOLISM, WITH INFARCTION 07/02/2010  . Angina   . Myocardial infarction "2 or 3"  . Pneumonia 02/26/12    "a few times; probably even today"  . Shortness of breath     "all the time"  . DIABETES MELLITUS, TYPE II 11/04/1983  . Blood transfusion 08/2011  . Lower GI bleeding 08/2011  . Chronic daily headache   . Migraines   . Stroke summer 2011    "made my left hip worse"  . Uterine cancer    Past Surgical History  Procedure Laterality Date  . Esophagogastroduodenoscopy   08/26/2011    Procedure: ESOPHAGOGASTRODUODENOSCOPY (EGD);  Surgeon: [name], MD;  Location: [facility] ENDOSCOPY;  Service: Endoscopy;  Laterality: N/A;  . Colonoscopy  08/28/2011    Procedure: COLONOSCOPY;  Surgeon: [name], MD;  Location: [place];  Service: Endoscopy;  Laterality: N/A;  . Vaginal hysterectomy    . Tubal ligation    . Cataract extraction w/ intraocular lens  implant, bilateral    . Toe surgery      "right big toe; operated on it to straighten it out; it was under"   Family History  Problem Relation Age of Onset  . Diabetes insipidus Mother   . Hypertension Mother    History  Substance Use Topics  . Smoking status: Never Smoker   . Smokeless tobacco: Former Systems developer    Types: Snuff    Quit date: 10/13/2006     Comment: 02/26/12 "stopped snuff 4-6 years ago"  . Alcohol Use: No     Comment: "stopped drinking alcohol ~ 1980's"   OB History   Grav Para Term Preterm Abortions TAB SAB Ect Mult Living                 Review of Systems  Respiratory: Positive for cough and shortness of breath.   Cardiovascular: Positive for leg swelling.  All other systems reviewed and are negative.      Allergies  Baking soda-fluoride and Magnesium hydroxide  Home Medications   Current Outpatient Rx  Name  Route  Sig  Dispense  Refill  . albuterol (PROAIR HFA) 108 (90 BASE) MCG/ACT inhaler   Inhalation   Inhale 2 puffs into the lungs every 6 (six) hours as needed for wheezing.   8.5 g   11   . amLODipine (NORVASC) 10 MG tablet   Oral   Take 1 tablet (10 mg total) by mouth daily.   30 tablet   2   . aspirin 81 MG EC tablet   Oral   Take 81 mg by mouth daily.           . BD INSULIN SYRINGE ULTRAFINE 31G X 5/16" 0.3 ML MISC      USE TO INJECT INSULIN (3 TIMES A DAY)   100 each   1   . calcium-vitamin D (OSCAL WITH D) 500-200 MG-UNIT per tablet   Oral   Take 1 tablet by mouth 3 (three) times daily.         Marland Kitchen EXPIRED: ferrous sulfate 325 (65 FE)  MG tablet   Oral   Take 325 mg by mouth 2 (two) times daily with a meal.         . insulin glargine (LANTUS) 100 UNIT/ML injection   Subcutaneous   Inject 30 Units into the skin at bedtime.         . insulin glargine (LANTUS) 100 UNIT/ML injection   Subcutaneous   Inject 30 Units into the skin at bedtime.   10 mL   11   . isosorbide mononitrate (IMDUR) 60 MG 24 hr tablet   Oral   Take 1 tablet (60 mg total) by mouth daily.   30 tablet   2   . loratadine (ALLERGY RELIEF) 10 MG tablet   Oral   Take 10 mg by mouth daily.         Marland Kitchen EXPIRED: metoprolol succinate (TOPROL-XL) 25 MG 24 hr tablet   Oral   Take 25 mg by mouth daily.         . nitroGLYCERIN (NITROSTAT) 0.4 MG SL tablet   Sublingual   Place 1 tablet (0.4 mg total) under the tongue every 5 (five) minutes as needed. For chest pain.   30 tablet   3   . rosuvastatin (CRESTOR) 20 MG tablet   Oral   Take 1 tablet (20 mg total) by mouth daily.   30 tablet   2   . senna (SENOKOT) 8.6 MG tablet   Oral   Take 1-2 tablets by mouth daily as needed. For constipation.         . traZODone (DESYREL) 50 MG tablet   Oral   Take 25 mg by mouth at bedtime.          BP 156/64  Pulse 87  Temp(Src) 98.8 F (37.1 C) (Rectal)  Resp 21  SpO2 97%  LMP 12/19/1968 Physical Exam  Nursing note and vitals reviewed. Constitutional: She appears well-developed and well-nourished.  HENT:  Head: Normocephalic and atraumatic.  Eyes: Conjunctivae are normal. Pupils are equal, round, and reactive to light.  Neck: Neck supple. JVD present. No tracheal deviation present. No thyromegaly present.  Cardiovascular: Normal rate and regular rhythm.   No murmur heard. Pulmonary/Chest: She is in respiratory distress. She has rales.  Rales one third way up bilaterally. Mild respiratory distress  Abdominal: Soft. Bowel sounds are normal. She exhibits no distension. There is no tenderness.  Musculoskeletal: Normal range of motion. She  exhibits edema. She exhibits no tenderness.  Trace pretibial  pitting edema bilaterally  Neurological: She is alert. Coordination normal.  Skin: Skin is warm and dry. No rash noted.  Psychiatric: She has a normal mood and affect.    ED Course  Procedures (including critical care time) Labs Review Labs Reviewed  CBC WITH DIFFERENTIAL - Abnormal; Notable for the following:    Hemoglobin 10.3 (*)    HCT 31.8 (*)    MCH 25.4 (*)    RDW 16.0 (*)    All other components within normal limits  PRO B NATRIURETIC PEPTIDE - Abnormal; Notable for the following:    Pro B Natriuretic peptide (BNP) 7767.0 (*)    All other components within normal limits  COMPREHENSIVE METABOLIC PANEL   Imaging Review Dg Chest Portable 1 View  12/03/2013   CLINICAL DATA:  Shortness of breath, history asthma, CHF, coronary artery disease, hypertension, stroke, MI, diabetes, uterine cancer  EXAM: PORTABLE CHEST - 1 VIEW  COMPARISON:  Portable exam 1835 hr without priors for comparison  FINDINGS: Enlargement of cardiac silhouette with slight pulmonary vascular congestion.  Layered pleural effusions bilaterally.  Suspect mild pulmonary infiltrates bilaterally as well, likely pulmonary edema/ CHF.  No pneumothorax.  Bones demineralized.  IMPRESSION: Question CHF with layered bilateral effusions and mild edema.   Electronically Signed   By: Lavonia Dana M.D.   On: 12/03/2013 19:02    EKG Interpretation   None       MDM  Lasix and nitroglycerin insulin ordered by me. Patient will be admitted to the step down unit. Spoke with Dr. Arnoldo Morale who will arrange for admission Final diagnoses:  None   8:20 PM patient states breathing somewhat improved after treatment with venous Lasix  Date: 12/03/2013  Rate: 90  Rhythm: normal sinus rhythm  QRS Axis: normal  Intervals: normal  ST/T Wave abnormalities: nonspecific ST/T changes  Conduction Disutrbances:left bundle branch block  Narrative Interpretation:   Old EKG Reviewed:  Left bundle branch block new over Feb 27 2012 interpreted by Results for orders placed during the hospital encounter of 12/03/13  CBC WITH DIFFERENTIAL      Result Value Ref Range   WBC 7.9  4.0 - 10.5 K/uL   RBC 4.06  3.87 - 5.11 MIL/uL   Hemoglobin 10.3 (*) 12.0 - 15.0 g/dL   HCT 31.8 (*) 36.0 - 46.0 %   MCV 78.3  78.0 - 100.0 fL   MCH 25.4 (*) 26.0 - 34.0 pg   MCHC 32.4  30.0 - 36.0 g/dL   RDW 16.0 (*) 11.5 - 15.5 %   Platelets 273  150 - 400 K/uL   Neutrophils Relative % 75  43 - 77 %   Neutro Abs 5.9  1.7 - 7.7 K/uL   Lymphocytes Relative 18  12 - 46 %   Lymphs Abs 1.5  0.7 - 4.0 K/uL   Monocytes Relative 6  3 - 12 %   Monocytes Absolute 0.5  0.1 - 1.0 K/uL   Eosinophils Relative 1  0 - 5 %   Eosinophils Absolute 0.1  0.0 - 0.7 K/uL   Basophils Relative 0  0 - 1 %   Basophils Absolute 0.0  0.0 - 0.1 K/uL  COMPREHENSIVE METABOLIC PANEL      Result Value Ref Range   Sodium 132 (*) 137 - 147 mEq/L   Potassium 4.1  3.7 - 5.3 mEq/L   Chloride 93 (*) 96 - 112 mEq/L   CO2 22  19 - 32 mEq/L   Glucose, Bld 578 (*)  70 - 99 mg/dL   BUN 20  6 - 23 mg/dL   Creatinine, Ser 1.09  0.50 - 1.10 mg/dL   Calcium 8.9  8.4 - 10.5 mg/dL   Total Protein 7.7  6.0 - 8.3 g/dL   Albumin 3.0 (*) 3.5 - 5.2 g/dL   AST 25  0 - 37 U/L   ALT 15  0 - 35 U/L   Alkaline Phosphatase 110  39 - 117 U/L   Total Bilirubin 0.6  0.3 - 1.2 mg/dL   GFR calc non Af Amer 47 (*) >90 mL/min   GFR calc Af Amer 55 (*) >90 mL/min  PRO B NATRIURETIC PEPTIDE      Result Value Ref Range   Pro B Natriuretic peptide (BNP) 7767.0 (*) 0 - 450 pg/mL   Dg Chest Portable 1 View  12/03/2013   CLINICAL DATA:  Shortness of breath, history asthma, CHF, coronary artery disease, hypertension, stroke, MI, diabetes, uterine cancer  EXAM: PORTABLE CHEST - 1 VIEW  COMPARISON:  Portable exam 1835 hr without priors for comparison  FINDINGS: Enlargement of cardiac silhouette with slight pulmonary vascular congestion.  Layered pleural  effusions bilaterally.  Suspect mild pulmonary infiltrates bilaterally as well, likely pulmonary edema/ CHF.  No pneumothorax.  Bones demineralized.  IMPRESSION: Question CHF with layered bilateral effusions and mild edema.   Electronically Signed   By: Lavonia Dana M.D.   On: 12/03/2013 19:02   Chest xray viewed by me Diagnosis #1 congestive heart failure with pulmonary edema #2 hyperglycemia #3 medication noncompliance #4 hypoxia  CRITICAL CARE Performed by: Orlie Dakin Total critical care time: 30 minute Critical care time was exclusive of separately billable procedures and treating other patients. Critical care was necessary to treat or prevent imminent or life-threatening deterioration. Critical care was time spent personally by me on the following activities: development of treatment plan with patient and/or surrogate as well as nursing, discussions with consultants, evaluation of patient's response to treatment, examination of patient, obtaining history from patient or surrogate, ordering and performing treatments and interventions, ordering and review of laboratory studies, ordering and review of radiographic studies, pulse oximetry and re-evaluation of patient's condition.  Orlie Dakin, MD 12/03/13 2041

## 2013-12-03 NOTE — ED Notes (Signed)
Dr. Jenkins at bedside. 

## 2013-12-03 NOTE — ED Notes (Signed)
Pt. Not taking diuretics per family.

## 2013-12-03 NOTE — H&P (Signed)
[facility] History and Physical  [name] HKV:[id number] DOB: 18-Jul-1933 DOA: 12/03/2013  Referring physician:  EDP PCP: Dr. Lucianne Lei and Dr. Iona Beard Specialists:   Chief Complaint: SOB  HPI: [name] is a 80 y.o. female with a history of CAD/MI, HTN. CHF, and DM2 who presents to the ED with complaints of worsening SOB over the past 2-3 Weeks.  She had been treated 3 weeks ago for an URI or Flu but continued to have worsening SOB.  She reports having DOE, and orthopnea as well as edema of her BLEs.  She denies havng any chest pain, she denies having any fevers or chills but she does report coughing up whitish sputum.      Review of Systems:  Constitutional: No Weight Loss, No Weight Gain, Night Sweats, Fevers, Chills, Fatigue, or +Generalized Weakness HEENT: No Headaches, Difficulty Swallowing,Tooth/Dental Problems,Sore Throat,  No Sneezing, Rhinitis, Ear Ache, Nasal Congestion, or Post Nasal Drip,  Cardio-vascular:  No Chest pain, Orthopnea, PND, Edema in lower extremities, Anasarca, Dizziness, Palpitations  Resp: +Dyspnea, +DOE, +Productive Cough, No Hemoptysis, No Change in Color of Mucus,  No Wheezing.    GI: No Heartburn, Indigestion, Abdominal Pain, Nausea, Vomiting, Diarrhea, Change in Bowel Habits,  Loss of Appetite  GU: No Dysuria, Change in Color of Urine, No Urgency or Frequency.  No flank pain.  Musculoskeletal: No Joint Pain or Swelling.  No Decreased Range of Motion. No Back Pain.  Neurologic: No Syncope, No Seizures, Muscle Weakness, Paresthesia, Vision Disturbance or Loss, No Diplopia, No Vertigo, No Difficulty Walking,  Skin: No Rash or Lesions. Psych: No Change in Mood or Affect. No Depression or Anxiety. No Memory loss. No Confusion or Hallucinations   Past Medical History  Diagnosis Date  . Asthma   . CHF (congestive heart failure)   . CAD (coronary artery disease)     Pt reports MI in 2006 (no documentation).  Cardiolite in 05/2002 and  07/2006 did not reveal any reversible ischemia.  Pt follows with Dr. Rex Kras at [facility].  . CHF (congestive heart failure)     EF 25-30% with dilated LV, mild LVH, severe hypokinesis, and mod-severe reduction in RV function  . Osteoporosis   . HYPERTENSION 08/03/2006  . GASTROPARESIS, DIABETIC 08/03/2006  . HYPERLIPIDEMIA 08/03/2006  . OBSTRUCTIVE SLEEP APNEA 01/06/2008  . PERIPHERAL NEUROPATHY 08/03/2006  . GERD 08/03/2006  . LOW BACK PAIN, CHRONIC 08/03/2006  . OSTEOPOROSIS 03/21/2009  . CEREBRAL EMBOLISM, WITH INFARCTION 07/02/2010  . Angina   . Myocardial infarction "2 or 3"  . Pneumonia 02/26/12    "a few times; probably even today"  . Shortness of breath     "all the time"  . DIABETES MELLITUS, TYPE II 11/04/1983  . Blood transfusion 08/2011  . Lower GI bleeding 08/2011  . Chronic daily headache   . Migraines   . Stroke summer 2011    "made my left hip worse"  . Uterine cancer       Past Surgical History  Procedure Laterality Date  . Esophagogastroduodenoscopy  08/26/2011    Procedure: ESOPHAGOGASTRODUODENOSCOPY (EGD);  Surgeon: [name], MD;  Location: [facility] ENDOSCOPY;  Service: Endoscopy;  Laterality: N/A;  . Colonoscopy  08/28/2011    Procedure: COLONOSCOPY;  Surgeon: [name], MD;  Location: [facility];  Service: Endoscopy;  Laterality: N/A;  . Vaginal hysterectomy    . Tubal ligation    . Cataract extraction w/ intraocular lens  implant, bilateral    . Toe  surgery      "right big toe; operated on it to straighten it out; it was under"       Prior to Admission medications   Medication Sig Start Date End Date Taking? Authorizing Provider  acetaminophen (TYLENOL) 500 MG tablet Take 500 mg by mouth every 6 (six) hours as needed for moderate pain or headache.   Yes Historical Provider, MD  albuterol (PROAIR HFA) 108 (90 BASE) MCG/ACT inhaler Inhale 2 puffs into the lungs every 6 (six) hours as needed for wheezing. 10/12/12  Yes Karren Cobble, MD  amLODipine  (NORVASC) 10 MG tablet Take 1 tablet (10 mg total) by mouth daily. 02/07/13  Yes Karren Cobble, MD  aspirin 81 MG EC tablet Take 81 mg by mouth daily.     Yes Historical Provider, MD  calcium-vitamin D (OSCAL WITH D) 500-200 MG-UNIT per tablet Take 1 tablet by mouth 3 (three) times daily. 12/20/10  Yes Nils Pyle, MD  insulin aspart (NOVOLOG FLEXPEN) 100 UNIT/ML FlexPen Inject 5 Units into the skin 3 (three) times daily with meals.   Yes Historical Provider, MD  insulin glargine (LANTUS) 100 UNIT/ML injection Inject 32 Units into the skin at bedtime.  12/12/11 12/11/14 Yes Nils Pyle, MD  isosorbide mononitrate (IMDUR) 60 MG 24 hr tablet Take 1 tablet (60 mg total) by mouth daily. 02/07/13  Yes Karren Cobble, MD  lisinopril (PRINIVIL,ZESTRIL) 5 MG tablet Take 5 mg by mouth daily.   Yes Historical Provider, MD  loratadine (ALLERGY RELIEF) 10 MG tablet Take 10 mg by mouth daily.   Yes Historical Provider, MD  Multiple Vitamins-Minerals (ALIVE WOMENS 50+ PO) Take 1 tablet by mouth daily.   Yes Historical Provider, MD  nitroGLYCERIN (NITROSTAT) 0.4 MG SL tablet Place 1 tablet (0.4 mg total) under the tongue every 5 (five) minutes as needed. For chest pain. 03/15/12  Yes Nils Pyle, MD  potassium chloride (K-DUR) 10 MEQ tablet Take 10 mEq by mouth daily.   Yes Historical Provider, MD  rosuvastatin (CRESTOR) 20 MG tablet Take 1 tablet (20 mg total) by mouth daily. 02/07/13  Yes Karren Cobble, MD  senna (SENOKOT) 8.6 MG tablet Take 1-2 tablets by mouth daily as needed. For constipation. 12/20/10  Yes Nils Pyle, MD  torsemide (DEMADEX) 10 MG tablet Take 10 mg by mouth daily.   Yes Historical Provider, MD  traZODone (DESYREL) 50 MG tablet Take 25 mg by mouth at bedtime. 12/12/11  Yes Nils Pyle, MD  ferrous sulfate 325 (65 FE) MG tablet Take 325 mg by mouth 2 (two) times daily with a meal. 08/29/11 08/28/12  Clarene Duke, MD  metoprolol succinate (TOPROL-XL) 25 MG 24 hr tablet Take 25  mg by mouth daily. 12/12/11 12/11/12  Nils Pyle, MD      Allergies  Allergen Reactions  . Baking Soda-Fluoride [Sodium Fluoride] Nausea And Vomiting  . Magnesium Hydroxide Nausea And Vomiting     Social History:  reports that she has never smoked. She quit smokeless tobacco use about 7 years ago. Her smokeless tobacco use included Snuff. She reports that she does not drink alcohol or use illicit drugs.     Family History  Problem Relation Age of Onset  . Diabetes insipidus Mother   . Hypertension Mother        Physical Exam:  GEN:  Pleasant  Elderly 80 y.o.  African American  female examined and in no acute distress; cooperative with exam Filed  Vitals:   12/03/13 1845 12/03/13 1944 12/03/13 2030 12/03/13 2200  BP: 170/59 156/64 157/53 153/72  Pulse:  87 83 79  Temp:  98.8 F (37.1 C)  98.7 F (37.1 C)  TempSrc:  Rectal  Oral  Resp:  21 39 26  Height:    5\' 4"  (1.626 m)  Weight:    74.7 kg (164 lb 10.9 oz)  SpO2: 100% 97% 99% 100%   Blood pressure 153/72, pulse 79, temperature 98.7 F (37.1 C), temperature source Oral, resp. rate 26, height 5\' 4"  (1.626 m), weight 74.7 kg (164 lb 10.9 oz), last menstrual period 12/19/1968, SpO2 100.00%. PSYCH: SHe is alert and oriented x4; does not appear anxious does not appear depressed; affect is normal HEENT: Normocephalic and Atraumatic, Mucous membranes pink; PERRLA; EOM intact; Fundi:  Benign;  No scleral icterus, Nares: Patent, Oropharynx: Clear, Fair Dentition, Neck:  FROM, no cervical lymphadenopathy nor thyromegaly or carotid bruit; no JVD; Breasts:: Not examined CHEST WALL: No tenderness CHEST: Normal respiration, clear to auscultation bilaterally HEART: Regular rate and rhythm; no murmurs rubs or gallops BACK: No kyphosis or scoliosis; no CVA tenderness ABDOMEN: Positive Bowel Sounds,  soft non-tender; no masses, no organomegaly, no pannus; no intertriginous candida. Rectal Exam: Not done EXTREMITIES: No cyanosis,  clubbing or  2+ EDEMA BLEs; no ulcerations. Genitalia: not examined PULSES: 2+ and symmetric SKIN: Normal hydration no rash or ulceration CNS:  A XO X 4,  Cranial Nerves II -XII Intact, Sensory and Motor Function Intact Vascular: pulses palpable throughout    Labs on Admission:  Basic Metabolic Panel:  Recent Labs Lab 12/03/13 1808  NA 132*  K 4.1  CL 93*  CO2 22  GLUCOSE 578*  BUN 20  CREATININE 1.09  CALCIUM 8.9   Liver Function Tests:  Recent Labs Lab 12/03/13 1808  AST 25  ALT 15  ALKPHOS 110  BILITOT 0.6  PROT 7.7  ALBUMIN 3.0*   No results found for this basename: LIPASE, AMYLASE,  in the last 168 hours No results found for this basename: AMMONIA,  in the last 168 hours CBC:  Recent Labs Lab 12/03/13 1808  WBC 7.9  NEUTROABS 5.9  HGB 10.3*  HCT 31.8*  MCV 78.3  PLT 273   Cardiac Enzymes: No results found for this basename: CKTOTAL, CKMB, CKMBINDEX, TROPONINI,  in the last 168 hours  BNP (last 3 results)  Recent Labs  12/03/13 1808  PROBNP 7767.0*   CBG:  Recent Labs Lab 12/03/13 2227  GLUCAP 357*    Radiological Exams on Admission: Dg Chest Portable 1 View  12/03/2013   CLINICAL DATA:  Shortness of breath, history asthma, CHF, coronary artery disease, hypertension, stroke, MI, diabetes, uterine cancer  EXAM: PORTABLE CHEST - 1 VIEW  COMPARISON:  Portable exam 1835 hr without priors for comparison  FINDINGS: Enlargement of cardiac silhouette with slight pulmonary vascular congestion.  Layered pleural effusions bilaterally.  Suspect mild pulmonary infiltrates bilaterally as well, likely pulmonary edema/ CHF.  No pneumothorax.  Bones demineralized.  IMPRESSION: Question CHF with layered bilateral effusions and mild edema.   Electronically Signed   By: Lavonia Dana M.D.   On: 12/03/2013 19:02      EKG: Independently reviewed. Diffuse Artifact, Sinus Rhythm with a LBBB    Assessment/Plan:   80 y.o. female with  Principal Problem:   Acute  diastolic CHF (congestive heart failure) Active Problems:   DIABETES MELLITUS, TYPE II   HYPERLIPIDEMIA   ANEMIA   HYPERTENSION  CRI (chronic renal insufficiency)   CAD (coronary artery disease)     1.  Acute Diastolic CHF- Placed on the Acute CHF Protocol,  Diurese with IV Lasix,   2-D ECHO ordered ( last 2 -D ECHO was in 08/2011).  O2 PRN.    2.  Uncontrolled DM2 on Insulin Rx-    continue Lantus Insulin, Check HbA1C in AM, and SSI coverage PRN Elevated Glucose levels.    3.   HTN-   Continue Amlodipine, Lisinopril and Metoprolol Rx,  And  on IV Lasix BID  for 4 doses.  Monitor BPs.    4.   Anemia-   Had been on Iron Supplement, check Anemia panel in AM.    5.   CRI-   Monitor Bun/Cr.     6.   CAD- hx.   On Imdur, ASA, Metoprolol,  And Lisinopril Rx.    7.  DVT prophylaxis with Lovenox.         Code Status:   FULL CODE Family Communication:    Daughter at Bedside Disposition Plan:    Inpatient  Time spent:  [facility] Pager [phone]  If 7PM-7AM, please contact night-coverage [email or web address] Password [email or web address] 12/03/2013, 10:40 PM

## 2013-12-04 DIAGNOSIS — E119 Type 2 diabetes mellitus without complications: Secondary | ICD-10-CM

## 2013-12-04 DIAGNOSIS — R413 Other amnesia: Secondary | ICD-10-CM | POA: Diagnosis present

## 2013-12-04 LAB — GLUCOSE, CAPILLARY
GLUCOSE-CAPILLARY: 208 mg/dL — AB (ref 70–99)
Glucose-Capillary: 92 mg/dL (ref 70–99)
Glucose-Capillary: 168 mg/dL — ABNORMAL HIGH (ref 70–99)
Glucose-Capillary: 228 mg/dL — ABNORMAL HIGH (ref 70–99)

## 2013-12-04 LAB — BASIC METABOLIC PANEL
BUN: 20 mg/dL (ref 6–23)
CALCIUM: 9 mg/dL (ref 8.4–10.5)
CO2: 29 mEq/L (ref 19–32)
Chloride: 101 mEq/L (ref 96–112)
Creatinine, Ser: 1.32 mg/dL — ABNORMAL HIGH (ref 0.50–1.10)
GFR calc Af Amer: 44 mL/min — ABNORMAL LOW (ref >90)
GFR, EST NON AFRICAN AMERICAN: 38 mL/min — AB (ref >90)
Glucose, Bld: 191 mg/dL — ABNORMAL HIGH (ref 70–99)
Potassium: 3.3 mEq/L — ABNORMAL LOW (ref 3.7–5.3)
SODIUM: 139 meq/L (ref 137–147)

## 2013-12-04 LAB — TROPONIN I: Troponin I: <0.3 ng/mL (ref <0.30)

## 2013-12-04 LAB — HEMOGLOBIN A1C
Hgb A1c MFr Bld: 10.6 % — ABNORMAL HIGH (ref <5.7)
MEAN PLASMA GLUCOSE: 258 mg/dL — AB (ref <117)

## 2013-12-04 MED ORDER — ENOXAPARIN SODIUM 40 MG/0.4ML ~~LOC~~ SOLN
40.0000 mg | SUBCUTANEOUS | Status: DC
  Administered 2013-12-04: 40 mg via SUBCUTANEOUS
  Filled 2013-12-04 (×2): qty 0.4

## 2013-12-04 MED ORDER — FUROSEMIDE 10 MG/ML IJ SOLN
20.0000 mg | Freq: Two times a day (BID) | INTRAMUSCULAR | Status: DC
  Administered 2013-12-04: 20 mg via INTRAVENOUS
  Filled 2013-12-04 (×2): qty 2

## 2013-12-04 NOTE — Progress Notes (Signed)
Followup note:   Patient admitted earlier this morning. Seen after transfer to step down. Stable. Breathing much more comfortably after diuresis. No complaints. No chest pain or shortness of breath.  Acute congestive heart failure systolic/diastolic: Checking 2-D echo. Patient has very poor insight and memory loss. I'm concerned about her bili the to care for herself. She states that she is grand daughters who help her. We'll discuss with them. Will refer patient to [facility] and case management. Transfer to cardiac floor. Monitor input and output.  Diabetes mellitus: Awaiting A1c. Again, patient has very poor insight and memory loss

## 2013-12-04 NOTE — Progress Notes (Signed)
Pt ambulated in the hallway. Pt experienced some SOB but able to maintain her O2 sats at 97-99 % on RA

## 2013-12-04 NOTE — Progress Notes (Signed)
Echocardiogram 2D Echocardiogram has been performed.  [name] 12/04/2013, 10:13 AM

## 2013-12-05 DIAGNOSIS — N179 Acute kidney failure, unspecified: Secondary | ICD-10-CM

## 2013-12-05 DIAGNOSIS — E1165 Type 2 diabetes mellitus with hyperglycemia: Secondary | ICD-10-CM

## 2013-12-05 DIAGNOSIS — IMO0001 Reserved for inherently not codable concepts without codable children: Secondary | ICD-10-CM

## 2013-12-05 LAB — PRO B NATRIURETIC PEPTIDE: Pro B Natriuretic peptide (BNP): 6023 pg/mL — ABNORMAL HIGH (ref 0–450)

## 2013-12-05 LAB — GLUCOSE, CAPILLARY
GLUCOSE-CAPILLARY: 189 mg/dL — AB (ref 70–99)
Glucose-Capillary: 119 mg/dL — ABNORMAL HIGH (ref 70–99)
Glucose-Capillary: 252 mg/dL — ABNORMAL HIGH (ref 70–99)

## 2013-12-05 LAB — CBC
HCT: 28.3 % — ABNORMAL LOW (ref 36.0–46.0)
HEMOGLOBIN: 8.9 g/dL — AB (ref 12.0–15.0)
MCH: 24.7 pg — ABNORMAL LOW (ref 26.0–34.0)
MCHC: 31.4 g/dL (ref 30.0–36.0)
MCV: 78.6 fL (ref 78.0–100.0)
Platelets: 229 10*3/uL (ref 150–400)
RBC: 3.6 MIL/uL — ABNORMAL LOW (ref 3.87–5.11)
RDW: 16.1 % — AB (ref 11.5–15.5)
WBC: 5.9 10*3/uL (ref 4.0–10.5)

## 2013-12-05 LAB — BASIC METABOLIC PANEL
BUN: 25 mg/dL — ABNORMAL HIGH (ref 6–23)
CALCIUM: 9.1 mg/dL (ref 8.4–10.5)
CHLORIDE: 102 meq/L (ref 96–112)
CO2: 26 meq/L (ref 19–32)
CREATININE: 1.85 mg/dL — AB (ref 0.50–1.10)
GFR calc Af Amer: 29 mL/min — ABNORMAL LOW (ref >90)
GFR calc non Af Amer: 25 mL/min — ABNORMAL LOW (ref >90)
GLUCOSE: 128 mg/dL — AB (ref 70–99)
Potassium: 4.2 mEq/L (ref 3.7–5.3)
Sodium: 140 mEq/L (ref 137–147)

## 2013-12-05 LAB — TROPONIN I

## 2013-12-05 MED ORDER — METOPROLOL SUCCINATE ER 25 MG PO TB24: 25.0000 mg | ORAL_TABLET | Freq: Every day | ORAL | Status: DC

## 2013-12-05 MED ORDER — SODIUM CHLORIDE 0.9 % IV SOLN
250.0000 mL | INTRAVENOUS | Status: DC | PRN
  Administered 2013-12-05: 250 mL via INTRAVENOUS

## 2013-12-05 NOTE — Progress Notes (Signed)
All d/c explained and given to pt.  Verbalized understanding.  D/c to awaiting car by w/c.  Daughter taking pt home.  Karie Kirks, Therapist, sports.

## 2013-12-05 NOTE — Discharge Summary (Addendum)
Physician Discharge Summary  [name] [id number] DOB: 1933/03/09 DOA: 12/03/2013  PCP: [name], MD  Admit date: 12/03/2013 Discharge date: 12/05/2013  Time spent: 35 minutes  Recommendations for Outpatient Follow-up:  1. Patient will followup with cardiology in the next one week 2. Patient will follow up with her primary care physician the next one month.  Discharge Diagnoses:  Principal Problem:   Acute diastolic CHF (congestive heart failure) Active Problems:   DIABETES MELLITUS, TYPE II   HYPERLIPIDEMIA   ANEMIA   HYPERTENSION   CAD (coronary artery disease)   Memory difficulties   Discharge Condition: Improved, being discharged home  Diet recommendation: Carb modified heart healthy  Filed Weights   12/04/13 0405 12/04/13 1700 12/05/13 0500  Weight: 74.7 kg (164 lb 10.9 oz) 74.072 kg (163 lb 4.8 oz) 75.5 kg (166 lb 7.2 oz)    History of present illness:  80 year old Afro-American female past history of CAD, diastolic heart failure and diabetes mellitus presented to the emergency room on 2/21 evening with complaints of worsening shortness of breath over the past few weeks. Patient lives at home with her son and granddaughter to care for her, however she is quite forgetful and inconsistently takes her medications. She is found to be in moderate CHF and also had blood sugars in the 500s. She was admitted to the hospitalist service.  Hospital Course:  Principal Problem:   Acute diastolic CHF (congestive heart failure): Initially, patient's BNP was 7767 and with diuresis, she initially diuresed about 1 L. However bites 2/23, patient's creatinine was up to 1.8 when previously had been normal. Her BNP was at 6000, however she is clinically feeling better no longer shortness of breath and he was angry in the hall on room air. Much of her BNP was felt to be more from renal dysfunction. Patient's Lasix was held and she was gently hydrated for a few hours with normal saline  at 50 cc an hour. She did not require fluid bolus. I will be for patient resume on home Demadex. She follow up with cardiology as outpatient. Interestingly, patient had an echocardiogram 2011 noting severely decreased ejection fraction of 20-25%, however an echocardiogram in 0000000 noted no systolic dysfunction but only grade 1 diastolic dysfunction repeat echocardiogram was done 2/22, however as at time of discharge on 2/23 it is still pending.   Active Problems:   DIABETES MELLITUS, TYPE II: A1c was markedly elevated at 10.6. Patient was continued on her normal dose of Lantus with sliding scale and her sugars actually remained quite stable. I discussed this with the patient's son is an indication that when patient does take her medications, she can be well controlled. After initial doses of NovoLog on admission, patient sugars have been stable since admission    HYPERLIPIDEMIA: Continue statin   ANEMIA: Stable. Initial hemoglobin was 10.3 on admission down to 8.9 a day and a half later, felt to be more secondary initial heme concentration. Chronically from chronic disease   HYPERTENSION   CAD (coronary artery disease)   Memory difficulties: Had extensive discussion with patient's son. We offered care management services, but they declined. I told the son, but he and her granddaughters with me to take a more active role in her care, not just reminded her to take her medicines, but also checking. He says that they will indeed do so.  Procedures:  Echocardiogram: Results pending  Consultations:  [facility] care management  Discharge Exam: Filed Vitals:   12/05/13 1542  BP:  128/50  Pulse: 64  Temp: 98.2 F (36.8 C)  Resp: 18    General: Alert and oriented x2, no acute distress very forgetful Cardiovascular: Regular rate and rhythm, X9-B7, 2/6 systolic ejection murmur Respiratory: Clear to auscultation bilaterally  Discharge Instructions  Discharge Orders   Future Orders Complete By Expires    Diet - low sodium heart healthy  As directed    Diet Carb Modified  As directed    Increase activity slowly  As directed        Medication List         acetaminophen 500 MG tablet  Commonly known as:  TYLENOL  Take 500 mg by mouth every 6 (six) hours as needed for moderate pain or headache.     albuterol 108 (90 BASE) MCG/ACT inhaler  Commonly known as:  PROAIR HFA  Inhale 2 puffs into the lungs every 6 (six) hours as needed for wheezing.     ALIVE WOMENS 50+ PO  Take 1 tablet by mouth daily.     ALLERGY RELIEF 10 MG tablet  Generic drug:  loratadine  Take 10 mg by mouth daily.     amLODipine 10 MG tablet  Commonly known as:  NORVASC  Take 1 tablet (10 mg total) by mouth daily.     aspirin 81 MG EC tablet  Take 81 mg by mouth daily.     calcium-vitamin D 500-200 MG-UNIT per tablet  Commonly known as:  OSCAL WITH D  Take 1 tablet by mouth 3 (three) times daily.     ferrous sulfate 325 (65 FE) MG tablet  Take 325 mg by mouth 2 (two) times daily with a meal.     insulin glargine 100 UNIT/ML injection  Commonly known as:  LANTUS  Inject 32 Units into the skin at bedtime.     isosorbide mononitrate 60 MG 24 hr tablet  Commonly known as:  IMDUR  Take 1 tablet (60 mg total) by mouth daily.     lisinopril 5 MG tablet  Commonly known as:  PRINIVIL,ZESTRIL  Take 5 mg by mouth daily.     metoprolol succinate 25 MG 24 hr tablet  Commonly known as:  TOPROL-XL  Take 1 tablet (25 mg total) by mouth daily.     nitroGLYCERIN 0.4 MG SL tablet  Commonly known as:  NITROSTAT  Place 1 tablet (0.4 mg total) under the tongue every 5 (five) minutes as needed. For chest pain.     NOVOLOG FLEXPEN 100 UNIT/ML FlexPen  Generic drug:  insulin aspart  Inject 5 Units into the skin 3 (three) times daily with meals.     potassium chloride 10 MEQ tablet  Commonly known as:  K-DUR  Take 10 mEq by mouth daily.     rosuvastatin 20 MG tablet  Commonly known as:  CRESTOR  Take 1  tablet (20 mg total) by mouth daily.     senna 8.6 MG tablet  Commonly known as:  SENOKOT  Take 1-2 tablets by mouth daily as needed. For constipation.     torsemide 10 MG tablet  Commonly known as:  DEMADEX  Take 10 mg by mouth daily.     traZODone 50 MG tablet  Commonly known as:  DESYREL  Take 25 mg by mouth at bedtime.       Allergies  Allergen Reactions  . Baking Soda-Fluoride [Sodium Fluoride] Nausea And Vomiting  . Magnesium Hydroxide Nausea And Vomiting       Follow-up Information  Follow up with HILL,GERALD K, MD In 1 month.   Specialty:  Family Medicine   Contact information:   [address] [place] [address] [phone]       Follow up with Pixie Casino, MD In 1 week.   Specialty:  Cardiology   Contact information:   [address] [place] [address] [phone]        The results of significant diagnostics from this hospitalization (including imaging, microbiology, ancillary and laboratory) are listed below for reference.    Significant Diagnostic Studies: Dg Chest Portable 1 View  12/03/2013   CLINICAL DATA:  Shortness of breath, history asthma, CHF, coronary artery disease, hypertension, stroke, MI, diabetes, uterine cancer  EXAM: PORTABLE CHEST - 1 VIEW  COMPARISON:  Portable exam 1835 hr without priors for comparison  FINDINGS: Enlargement of cardiac silhouette with slight pulmonary vascular congestion.  Layered pleural effusions bilaterally.  Suspect mild pulmonary infiltrates bilaterally as well, likely pulmonary edema/ CHF.  No pneumothorax.  Bones demineralized.  IMPRESSION: Question CHF with layered bilateral effusions and mild edema.   Electronically Signed   By: Lavonia Dana M.D.   On: 12/03/2013 19:02    Microbiology: Recent Results (from the past 240 hour(s))  MRSA PCR SCREENING     Status: None   Collection Time    12/03/13 10:08 PM      Result Value Ref Range Status   MRSA by PCR NEGATIVE  NEGATIVE  Final   Comment:            The GeneXpert MRSA Assay (FDA     approved for NASAL specimens     only), is one component of a     comprehensive MRSA colonization     surveillance program. It is not     intended to diagnose MRSA     infection nor to guide or     monitor treatment for     MRSA infections.     Labs: Basic Metabolic Panel:  Recent Labs Lab 12/03/13 1808 12/04/13 0404 12/05/13 0458  NA 132* 139 140  K 4.1 3.3* 4.2  CL 93* 101 102  CO2 22 29 26   GLUCOSE 578* 191* 128*  BUN 20 20 25*  CREATININE 1.09 1.32* 1.85*  CALCIUM 8.9 9.0 9.1   Liver Function Tests:  Recent Labs Lab 12/03/13 1808  AST 25  ALT 15  ALKPHOS 110  BILITOT 0.6  PROT 7.7  ALBUMIN 3.0*   No results found for this basename: LIPASE, AMYLASE,  in the last 168 hours No results found for this basename: AMMONIA,  in the last 168 hours CBC:  Recent Labs Lab 12/03/13 1808 12/05/13 0458  WBC 7.9 5.9  NEUTROABS 5.9  --   HGB 10.3* 8.9*  HCT 31.8* 28.3*  MCV 78.3 78.6  PLT 273 229   Cardiac Enzymes:  Recent Labs Lab 12/03/13 2338 12/04/13 0404 12/04/13 1153 12/05/13 0458  TROPONINI <0.30 <0.30 <0.30 <0.30   BNP: BNP (last 3 results)  Recent Labs  12/03/13 1808 12/05/13 0448  PROBNP 7767.0* 6023.0*   CBG:  Recent Labs Lab 12/04/13 1701 12/04/13 2125 12/05/13 0609 12/05/13 1158 12/05/13 1649  GLUCAP 228* 208* 119* 189* 252*       Signed:  KRISHNAN,SENDIL K  [facility] 12/05/2013, 6:26 PM

## 2013-12-05 NOTE — Discharge Instructions (Signed)
Heart Failure Heart failure means your heart has trouble pumping blood. This makes it hard for your body to work well. Heart failure is usually a long-term (chronic) condition. You must take good care of yourself and follow your doctor's treatment plan. HOME CARE  Take your heart medicine as told by your doctor.  Do not stop taking medicine unless your doctor tells you to.  Do not skip any dose of medicine.  Refill your medicines before they run out.  Take other medicines only as told by your doctor or pharmacist.  Stay active if told by your doctor. The elderly and people with severe heart failure should talk with a doctor about physical activity.  Eat heart healthy foods. Choose foods that are without trans fat and are low in saturated fat, cholesterol, and salt (sodium). This includes fresh or frozen fruits and vegetables, fish, lean meats, fat-free or low-fat dairy foods, whole grains, and high-fiber foods. Lentils and dried peas and beans (legumes) are also good choices.  Limit salt if told by your doctor.  Cook in a healthy way. Roast, grill, broil, bake, poach, steam, or stir-fry foods.  Limit fluids as told by your doctor.  Weigh yourself every morning. Do this after you pee (urinate) and before you eat breakfast. Write down your weight to give to your doctor.  Take your blood pressure and write it down if your doctor tell you to.  Ask your doctor how to check your pulse. Check your pulse as told.  Lose weight if told by your doctor.  Stop smoking or chewing tobacco. Do not use gum or patches that help you quit without your doctor's approval.  Schedule and go to doctor visits as told.  Nonpregnant women should have no more than 1 drink a day. Men should have no more than 2 drinks a day. Talk to your doctor about drinking alcohol.  Stop illegal drug use.  Stay current with shots (immunizations).  Manage your health conditions as told by your doctor.  Learn to manage  your stress.  Rest when you are tired.  If it is really hot outside:  Avoid intense activities.  Use air conditioning or fans, or get in a cooler place.  Avoid caffeine and alcohol.  Wear loose-fitting, lightweight, and light-colored clothing.  If it is really cold outside:  Avoid intense activities.  Layer your clothing.  Wear mittens or gloves, a hat, and a scarf when going outside.  Avoid alcohol.  Learn about heart failure and get support as needed.  Get help to maintain or improve your quality of life and your ability to care for yourself as needed. GET HELP IF:   You gain 03 lb/1.4 kg or more in 1 day or 05 lb/2.3 kg in a week.  You are more short of breath than usual.  You cannot do your normal activities.  You tire easily.  You cough more than normal, especially with activity.  You have any or more puffiness (swelling) in areas such as your hands, feet, ankles, or belly (abdomen).  You cannot sleep because it is hard to breathe.  You feel like your heart is beating fast (palpitations).  You get dizzy or lightheaded when you stand up. GET HELP RIGHT AWAY IF:   You have trouble breathing.  There is a change in mental status, such as becoming less alert or not being able to focus.  You have chest pain or discomfort.  You faint. MAKE SURE YOU:   Understand these   instructions.  Will watch your condition.  Will get help right away if you are not doing well or get worse. Document Released: 07/08/2008 Document Revised: 01/24/2013 Document Reviewed: 04/29/2012 Exit[employer] Patient Information 2014 [employer].  

## 2013-12-05 NOTE — Care Management Note (Addendum)
  Page 1 of 1   12/05/2013     12:30:26 PM   CARE MANAGEMENT NOTE 12/05/2013  Patient:  [name], [name]   Account Number:  [id number]  Date Initiated:  12/05/2013  Documentation initiated by:  Mariann Laster  Subjective/Objective Assessment:   Admitted with CHF     Action/Plan:   CM will follow for disposition needs   Anticipated DC Date:  12/08/2013   Anticipated DC Plan:           Choice offered to / List presented to:             Status of service:  In process, will continue to follow Medicare Important Message given?   (If response is "NO", the following Medicare IM given date fields will be blank) Date Medicare IM given:   Date Additional Medicare IM given:    Discharge Disposition:    Per UR Regulation:  Reviewed for med. necessity/level of care/duration of stay  If discussed at Eugenio Saenz of Stay Meetings, dates discussed:    Comments:

## 2013-12-05 NOTE — Progress Notes (Signed)
UR completed Gates Jividen K. Caitlin Hillmer, RN, BSN, MSHL, CCM  12/05/2013 12:30 PM

## 2013-12-05 NOTE — Progress Notes (Signed)
Patient evaluated for community based chronic disease management services with [facility] as a benefit of patient's [employer]. Spoke with patient and son at bedside to explain [facility] services.  Services declined.  Second attempt to engage made with Dr Maryland Pink without success.  Family maintains Left contact information and [facility] literature at bedside. Made Inpatient Case Manager aware that [facility] following. Of note, [facility] Management services does not replace or interfere with any services that are arranged by inpatient case management or social work.  For additional questions or referrals please contact Corliss Blacker BSN RN [facility] Liaison at [phone].

## 2013-12-12 ENCOUNTER — Ambulatory Visit (INDEPENDENT_AMBULATORY_CARE_PROVIDER_SITE_OTHER): Payer: Medicare Other | Admitting: Cardiovascular Disease

## 2013-12-12 ENCOUNTER — Encounter: Payer: Self-pay | Admitting: Cardiovascular Disease

## 2013-12-12 VITALS — BP 138/64 | HR 68 | Ht 64.0 in

## 2013-12-12 DIAGNOSIS — I428 Other cardiomyopathies: Secondary | ICD-10-CM

## 2013-12-12 DIAGNOSIS — I2510 Atherosclerotic heart disease of native coronary artery without angina pectoris: Secondary | ICD-10-CM

## 2013-12-12 DIAGNOSIS — E785 Hyperlipidemia, unspecified: Secondary | ICD-10-CM

## 2013-12-12 DIAGNOSIS — R0989 Other specified symptoms and signs involving the circulatory and respiratory systems: Secondary | ICD-10-CM

## 2013-12-12 DIAGNOSIS — I10 Essential (primary) hypertension: Secondary | ICD-10-CM

## 2013-12-12 NOTE — Assessment & Plan Note (Signed)
On statin therapy followed by her PCP 

## 2013-12-12 NOTE — Assessment & Plan Note (Signed)
Controlled on current medications 

## 2013-12-12 NOTE — Assessment & Plan Note (Signed)
Patient had cardiac catheterization performed by Dr. Debara Pickett 05/24/10 revealing minimal CAD with an ejection fraction of 20% consistent with a nonischemic cardiomyopathy. She was initially hospitalized with congestive heart failure. Her BNP was 7000 and a chest x-ray was consistent with this. She was treated with IV Lasix.

## 2013-12-12 NOTE — Progress Notes (Signed)
12/12/2013 [name]   [birth year]  [id number]  Primary Physician [name], MD Primary Cardiologist: Lorretta Harp MD Renae Gloss   HPI: [name] is a 80 year old thin appearing widowed African American female mother of 88 living children accompanied by one of her daughters. She has been seen by Dr.  Aldona Bar in the past. She's a history of nonischemic myopathy documented by cardiac catheterization performed at Dr. Reyes Ivan 05/24/10. She had minimal CAD with an ejection fraction of 20%. Her other problems include 2 hypertension, hyperlipidemia and diabetes. She currently has a stroke in the past and walks with the aid of a walker. She was recently recently hospitalized from 2/21 to the 23rd of last month with congestive heart failure. Pro BNP was 7000 and she was treated with IV diuretics.   Current Outpatient Prescriptions  Medication Sig Dispense Refill  . acetaminophen (TYLENOL) 500 MG tablet Take 500 mg by mouth every 6 (six) hours as needed for moderate pain or headache.      . albuterol (PROAIR HFA) 108 (90 BASE) MCG/ACT inhaler Inhale 2 puffs into the lungs every 6 (six) hours as needed for wheezing.  8.5 g  11  . amLODipine (NORVASC) 10 MG tablet Take 1 tablet (10 mg total) by mouth daily.  30 tablet  2  . aspirin 81 MG EC tablet Take 81 mg by mouth daily.        . BD INSULIN SYRINGE ULTRAFINE 31G X 5/16" 0.3 ML MISC       . calcium-vitamin D (OSCAL WITH D) 500-200 MG-UNIT per tablet Take 1 tablet by mouth 3 (three) times daily.      . ferrous sulfate 325 (65 FE) MG tablet Take 325 mg by mouth 2 (two) times daily with a meal.      . insulin aspart (NOVOLOG FLEXPEN) 100 UNIT/ML FlexPen Inject 5 Units into the skin 3 (three) times daily with meals.      . insulin glargine (LANTUS) 100 UNIT/ML injection Inject 32 Units into the skin at bedtime.       . isosorbide mononitrate (IMDUR) 60 MG 24 hr tablet Take 1 tablet (60 mg total) by mouth daily.  30 tablet  2  .  lisinopril (PRINIVIL,ZESTRIL) 5 MG tablet Take 5 mg by mouth daily.      Marland Kitchen loratadine (ALLERGY RELIEF) 10 MG tablet Take 10 mg by mouth daily.      . metoprolol succinate (TOPROL-XL) 25 MG 24 hr tablet Take 1 tablet (25 mg total) by mouth daily.  30 tablet  0  . Multiple Vitamins-Minerals (ALIVE WOMENS 50+ PO) Take 1 tablet by mouth daily.      . nitroGLYCERIN (NITROSTAT) 0.4 MG SL tablet Place 1 tablet (0.4 mg total) under the tongue every 5 (five) minutes as needed. For chest pain.  30 tablet  3  . potassium chloride (K-DUR) 10 MEQ tablet Take 10 mEq by mouth daily.      . rosuvastatin (CRESTOR) 20 MG tablet Take 1 tablet (20 mg total) by mouth daily.  30 tablet  2  . senna (SENOKOT) 8.6 MG tablet Take 1-2 tablets by mouth daily as needed. For constipation.      . torsemide (DEMADEX) 10 MG tablet Take 10 mg by mouth daily.      . traZODone (DESYREL) 50 MG tablet Take 25 mg by mouth at bedtime.       No current facility-administered medications for this visit.    Allergies  Allergen Reactions  . Baking Soda-Fluoride [Sodium Fluoride] Nausea And Vomiting  . Magnesium Hydroxide Nausea And Vomiting    History   Social History  . Marital Status: Widowed    Spouse Name: N/A    Number of Children: N/A  . Years of Education: N/A   Occupational History  . Not on file.   Social History Main Topics  . Smoking status: Never Smoker   . Smokeless tobacco: Former Systems developer    Types: Snuff    Quit date: 10/13/2006     Comment: 02/26/12 "stopped snuff 4-6 years ago"  . Alcohol Use: No     Comment: "stopped drinking alcohol ~ 1980's"  . Drug Use: No  . Sexual Activity: No   Other Topics Concern  . Not on file   Social History Narrative   ** Merged History Encounter **       Lives with daughter     Review of Systems: General: negative for chills, fever, night sweats or weight changes.  Cardiovascular: negative for chest pain, dyspnea on exertion, edema, orthopnea, palpitations,  paroxysmal nocturnal dyspnea or shortness of breath Dermatological: negative for rash Respiratory: negative for cough or wheezing Urologic: negative for hematuria Abdominal: negative for nausea, vomiting, diarrhea, bright red blood per rectum, melena, or hematemesis Neurologic: negative for visual changes, syncope, or dizziness All other systems reviewed and are otherwise negative except as noted above.    Blood pressure 138/64, pulse 68, height 5\' 4"  (1.626 m), weight 0 kg (0 lb), last menstrual period 12/19/1968.  General appearance: alert and no distress Neck: no adenopathy, no JVD, supple, symmetrical, trachea midline, thyroid not enlarged, symmetric, no tenderness/mass/nodules and loud bilateral carotid bruits Lungs: clear to auscultation bilaterally Heart: regular rate and rhythm, S1, S2 normal, no murmur, click, rub or gallop Extremities: extremities normal, atraumatic, no cyanosis or edema  EKG normal sinus rhythm at 68 with a bundle-branch block  ASSESSMENT AND PLAN:   Nonischemic cardiomyopathy Patient had cardiac catheterization performed by Dr. Debara Pickett 05/24/10 revealing minimal CAD with an ejection fraction of 20% consistent with a nonischemic cardiomyopathy. She was initially hospitalized with congestive heart failure. Her BNP was 7000 and a chest x-ray was consistent with this. She was treated with IV Lasix.  HYPERLIPIDEMIA On statin therapy followed by her PCP  HYPERTENSION Controlled on current medications      Lorretta Harp MD [facility] [place], [facility] 12/12/2013 4:06 PM

## 2013-12-12 NOTE — Patient Instructions (Signed)
  We will see you back in follow up in 3 months with an extender and 6 months with Dr Gwenlyn Found  Dr Gwenlyn Found has ordered an echocardiogram and carotid dopplers

## 2013-12-28 ENCOUNTER — Ambulatory Visit (HOSPITAL_BASED_OUTPATIENT_CLINIC_OR_DEPARTMENT_OTHER)
Admission: RE | Admit: 2013-12-28 | Discharge: 2013-12-28 | Disposition: A | Discharge status: Home / Self Care | Payer: Medicare Other | Source: Ambulatory Visit | Attending: Cardiovascular Disease | Admitting: Cardiovascular Disease

## 2013-12-28 ENCOUNTER — Ambulatory Visit (HOSPITAL_COMMUNITY)
Admission: RE | Admit: 2013-12-28 | Discharge: 2013-12-28 | Disposition: A | Discharge status: Home / Self Care | Payer: Medicare Other | Source: Ambulatory Visit | Attending: Cardiovascular Disease | Admitting: Cardiovascular Disease

## 2013-12-28 DIAGNOSIS — I428 Other cardiomyopathies: Secondary | ICD-10-CM | POA: Insufficient documentation

## 2013-12-28 DIAGNOSIS — R0989 Other specified symptoms and signs involving the circulatory and respiratory systems: Secondary | ICD-10-CM

## 2013-12-28 DIAGNOSIS — I059 Rheumatic mitral valve disease, unspecified: Secondary | ICD-10-CM

## 2013-12-28 DIAGNOSIS — I509 Heart failure, unspecified: Secondary | ICD-10-CM | POA: Insufficient documentation

## 2013-12-28 NOTE — Progress Notes (Signed)
2D Echo Performed 12/28/2013    [name], RCS

## 2013-12-28 NOTE — Progress Notes (Signed)
Carotid Duplex Completed. Mallory Enriques, BS, RDMS, RVT  

## 2014-01-10 ENCOUNTER — Encounter: Payer: Self-pay | Admitting: *Deleted

## 2014-01-11 DIAGNOSIS — I214 Non-ST elevation (NSTEMI) myocardial infarction: Secondary | ICD-10-CM

## 2014-01-11 HISTORY — DX: Non-ST elevation (NSTEMI) myocardial infarction: I21.4

## 2014-02-04 ENCOUNTER — Inpatient Hospital Stay (HOSPITAL_COMMUNITY)
Admission: EM | Admit: 2014-02-04 | Discharge: 2014-02-10 | DRG: 377 | Disposition: A | Discharge status: Skilled Nursing Facility | Payer: Medicare Other | Attending: Internal Medicine | Admitting: Internal Medicine

## 2014-02-04 ENCOUNTER — Encounter (HOSPITAL_COMMUNITY): Payer: Self-pay | Admitting: Emergency Medicine

## 2014-02-04 ENCOUNTER — Emergency Department (HOSPITAL_COMMUNITY): Payer: Medicare Other

## 2014-02-04 DIAGNOSIS — G609 Hereditary and idiopathic neuropathy, unspecified: Secondary | ICD-10-CM | POA: Diagnosis present

## 2014-02-04 DIAGNOSIS — D62 Acute posthemorrhagic anemia: Secondary | ICD-10-CM | POA: Diagnosis present

## 2014-02-04 DIAGNOSIS — M545 Low back pain, unspecified: Secondary | ICD-10-CM | POA: Diagnosis present

## 2014-02-04 DIAGNOSIS — J45909 Unspecified asthma, uncomplicated: Secondary | ICD-10-CM

## 2014-02-04 DIAGNOSIS — K219 Gastro-esophageal reflux disease without esophagitis: Secondary | ICD-10-CM

## 2014-02-04 DIAGNOSIS — J069 Acute upper respiratory infection, unspecified: Secondary | ICD-10-CM

## 2014-02-04 DIAGNOSIS — I2789 Other specified pulmonary heart diseases: Secondary | ICD-10-CM | POA: Diagnosis present

## 2014-02-04 DIAGNOSIS — I4891 Unspecified atrial fibrillation: Secondary | ICD-10-CM | POA: Diagnosis present

## 2014-02-04 DIAGNOSIS — M810 Age-related osteoporosis without current pathological fracture: Secondary | ICD-10-CM

## 2014-02-04 DIAGNOSIS — Z794 Long term (current) use of insulin: Secondary | ICD-10-CM

## 2014-02-04 DIAGNOSIS — I498 Other specified cardiac arrhythmias: Secondary | ICD-10-CM

## 2014-02-04 DIAGNOSIS — R29810 Facial weakness: Secondary | ICD-10-CM | POA: Diagnosis present

## 2014-02-04 DIAGNOSIS — N183 Chronic kidney disease, stage 3 unspecified: Secondary | ICD-10-CM | POA: Diagnosis present

## 2014-02-04 DIAGNOSIS — Z7982 Long term (current) use of aspirin: Secondary | ICD-10-CM

## 2014-02-04 DIAGNOSIS — I214 Non-ST elevation (NSTEMI) myocardial infarction: Secondary | ICD-10-CM | POA: Diagnosis present

## 2014-02-04 DIAGNOSIS — E871 Hypo-osmolality and hyponatremia: Secondary | ICD-10-CM | POA: Diagnosis present

## 2014-02-04 DIAGNOSIS — Z79899 Other long term (current) drug therapy: Secondary | ICD-10-CM

## 2014-02-04 DIAGNOSIS — I6389 Other cerebral infarction: Secondary | ICD-10-CM | POA: Diagnosis present

## 2014-02-04 DIAGNOSIS — F0390 Unspecified dementia without behavioral disturbance: Secondary | ICD-10-CM | POA: Diagnosis present

## 2014-02-04 DIAGNOSIS — I2510 Atherosclerotic heart disease of native coronary artery without angina pectoris: Secondary | ICD-10-CM

## 2014-02-04 DIAGNOSIS — E872 Acidosis, unspecified: Secondary | ICD-10-CM | POA: Diagnosis present

## 2014-02-04 DIAGNOSIS — Z8673 Personal history of transient ischemic attack (TIA), and cerebral infarction without residual deficits: Secondary | ICD-10-CM

## 2014-02-04 DIAGNOSIS — Z9181 History of falling: Secondary | ICD-10-CM

## 2014-02-04 DIAGNOSIS — N179 Acute kidney failure, unspecified: Secondary | ICD-10-CM | POA: Diagnosis present

## 2014-02-04 DIAGNOSIS — M1990 Unspecified osteoarthritis, unspecified site: Secondary | ICD-10-CM

## 2014-02-04 DIAGNOSIS — D500 Iron deficiency anemia secondary to blood loss (chronic): Secondary | ICD-10-CM | POA: Diagnosis present

## 2014-02-04 DIAGNOSIS — R4789 Other speech disturbances: Secondary | ICD-10-CM | POA: Diagnosis present

## 2014-02-04 DIAGNOSIS — K5732 Diverticulitis of large intestine without perforation or abscess without bleeding: Secondary | ICD-10-CM | POA: Diagnosis present

## 2014-02-04 DIAGNOSIS — I252 Old myocardial infarction: Secondary | ICD-10-CM

## 2014-02-04 DIAGNOSIS — I639 Cerebral infarction, unspecified: Secondary | ICD-10-CM

## 2014-02-04 DIAGNOSIS — IMO0001 Reserved for inherently not codable concepts without codable children: Secondary | ICD-10-CM

## 2014-02-04 DIAGNOSIS — K922 Gastrointestinal hemorrhage, unspecified: Secondary | ICD-10-CM | POA: Diagnosis present

## 2014-02-04 DIAGNOSIS — G4733 Obstructive sleep apnea (adult) (pediatric): Secondary | ICD-10-CM | POA: Diagnosis present

## 2014-02-04 DIAGNOSIS — K7200 Acute and subacute hepatic failure without coma: Secondary | ICD-10-CM | POA: Diagnosis present

## 2014-02-04 DIAGNOSIS — Z8542 Personal history of malignant neoplasm of other parts of uterus: Secondary | ICD-10-CM | POA: Diagnosis not present

## 2014-02-04 DIAGNOSIS — R4182 Altered mental status, unspecified: Secondary | ICD-10-CM

## 2014-02-04 DIAGNOSIS — R5381 Other malaise: Secondary | ICD-10-CM | POA: Diagnosis present

## 2014-02-04 DIAGNOSIS — R413 Other amnesia: Secondary | ICD-10-CM

## 2014-02-04 DIAGNOSIS — Z9849 Cataract extraction status, unspecified eye: Secondary | ICD-10-CM

## 2014-02-04 DIAGNOSIS — R4701 Aphasia: Secondary | ICD-10-CM | POA: Diagnosis present

## 2014-02-04 DIAGNOSIS — D649 Anemia, unspecified: Secondary | ICD-10-CM

## 2014-02-04 DIAGNOSIS — I6340 Cerebral infarction due to embolism of unspecified cerebral artery: Secondary | ICD-10-CM

## 2014-02-04 DIAGNOSIS — E785 Hyperlipidemia, unspecified: Secondary | ICD-10-CM | POA: Diagnosis present

## 2014-02-04 DIAGNOSIS — G9341 Metabolic encephalopathy: Secondary | ICD-10-CM | POA: Diagnosis present

## 2014-02-04 DIAGNOSIS — I5032 Chronic diastolic (congestive) heart failure: Secondary | ICD-10-CM

## 2014-02-04 DIAGNOSIS — I6350 Cerebral infarction due to unspecified occlusion or stenosis of unspecified cerebral artery: Secondary | ICD-10-CM | POA: Diagnosis present

## 2014-02-04 DIAGNOSIS — R778 Other specified abnormalities of plasma proteins: Secondary | ICD-10-CM

## 2014-02-04 DIAGNOSIS — G8929 Other chronic pain: Secondary | ICD-10-CM | POA: Diagnosis present

## 2014-02-04 DIAGNOSIS — K2961 Other gastritis with bleeding: Principal | ICD-10-CM

## 2014-02-04 DIAGNOSIS — I499 Cardiac arrhythmia, unspecified: Secondary | ICD-10-CM

## 2014-02-04 DIAGNOSIS — K3184 Gastroparesis: Secondary | ICD-10-CM | POA: Diagnosis present

## 2014-02-04 DIAGNOSIS — Z66 Do not resuscitate: Secondary | ICD-10-CM | POA: Diagnosis present

## 2014-02-04 DIAGNOSIS — IMO0002 Reserved for concepts with insufficient information to code with codable children: Secondary | ICD-10-CM

## 2014-02-04 DIAGNOSIS — E1149 Type 2 diabetes mellitus with other diabetic neurological complication: Secondary | ICD-10-CM | POA: Diagnosis present

## 2014-02-04 DIAGNOSIS — I059 Rheumatic mitral valve disease, unspecified: Secondary | ICD-10-CM | POA: Diagnosis present

## 2014-02-04 DIAGNOSIS — I959 Hypotension, unspecified: Secondary | ICD-10-CM | POA: Diagnosis present

## 2014-02-04 DIAGNOSIS — I5042 Chronic combined systolic (congestive) and diastolic (congestive) heart failure: Secondary | ICD-10-CM | POA: Diagnosis present

## 2014-02-04 DIAGNOSIS — I428 Other cardiomyopathies: Secondary | ICD-10-CM | POA: Diagnosis present

## 2014-02-04 DIAGNOSIS — I509 Heart failure, unspecified: Secondary | ICD-10-CM

## 2014-02-04 DIAGNOSIS — Z87891 Personal history of nicotine dependence: Secondary | ICD-10-CM | POA: Diagnosis not present

## 2014-02-04 DIAGNOSIS — J811 Chronic pulmonary edema: Secondary | ICD-10-CM

## 2014-02-04 DIAGNOSIS — I10 Essential (primary) hypertension: Secondary | ICD-10-CM | POA: Diagnosis present

## 2014-02-04 DIAGNOSIS — R5383 Other fatigue: Secondary | ICD-10-CM | POA: Diagnosis present

## 2014-02-04 DIAGNOSIS — R799 Abnormal finding of blood chemistry, unspecified: Secondary | ICD-10-CM

## 2014-02-04 DIAGNOSIS — R7989 Other specified abnormal findings of blood chemistry: Secondary | ICD-10-CM

## 2014-02-04 DIAGNOSIS — I129 Hypertensive chronic kidney disease with stage 1 through stage 4 chronic kidney disease, or unspecified chronic kidney disease: Secondary | ICD-10-CM | POA: Diagnosis present

## 2014-02-04 DIAGNOSIS — E1165 Type 2 diabetes mellitus with hyperglycemia: Secondary | ICD-10-CM

## 2014-02-04 DIAGNOSIS — I5031 Acute diastolic (congestive) heart failure: Secondary | ICD-10-CM

## 2014-02-04 DIAGNOSIS — G4700 Insomnia, unspecified: Secondary | ICD-10-CM

## 2014-02-04 HISTORY — DX: Acute kidney failure, unspecified: N17.9

## 2014-02-04 HISTORY — DX: Non-ST elevation (NSTEMI) myocardial infarction: I21.4

## 2014-02-04 LAB — CBC WITH DIFFERENTIAL/PLATELET
Basophils Absolute: 0 10*3/uL (ref 0.0–0.1)
Basophils Relative: 0 % (ref 0–1)
Eosinophils Absolute: 0 10*3/uL (ref 0.0–0.7)
Eosinophils Relative: 0 % (ref 0–5)
HEMATOCRIT: 19.9 % — AB (ref 36.0–46.0)
Hemoglobin: 6.2 g/dL — CL (ref 12.0–15.0)
LYMPHS PCT: 21 % (ref 12–46)
Lymphs Abs: 2 10*3/uL (ref 0.7–4.0)
MCH: 20.3 pg — AB (ref 26.0–34.0)
MCHC: 31.2 g/dL (ref 30.0–36.0)
MCV: 65 fL — ABNORMAL LOW (ref 78.0–100.0)
MONO ABS: 0.8 10*3/uL (ref 0.1–1.0)
MONOS PCT: 9 % (ref 3–12)
Neutro Abs: 6.3 10*3/uL (ref 1.7–7.7)
Neutrophils Relative %: 69 % (ref 43–77)
Platelets: 283 10*3/uL (ref 150–400)
RBC: 3.06 MIL/uL — AB (ref 3.87–5.11)
RDW: 18.3 % — ABNORMAL HIGH (ref 11.5–15.5)
WBC: 9.1 10*3/uL (ref 4.0–10.5)

## 2014-02-04 LAB — OSMOLALITY: Osmolality: 299 mOsm/kg (ref 275–300)

## 2014-02-04 LAB — URINALYSIS, ROUTINE W REFLEX MICROSCOPIC
Bilirubin Urine: NEGATIVE
Glucose, UA: NEGATIVE mg/dL
HGB URINE DIPSTICK: NEGATIVE
Ketones, ur: NEGATIVE mg/dL
Leukocytes, UA: NEGATIVE
Nitrite: NEGATIVE
PH: 5 (ref 5.0–8.0)
Protein, ur: 100 mg/dL — AB
SPECIFIC GRAVITY, URINE: 1.018 (ref 1.005–1.030)
UROBILINOGEN UA: 0.2 mg/dL (ref 0.0–1.0)

## 2014-02-04 LAB — TROPONIN I: Troponin I: <0.3 ng/mL (ref <0.30)

## 2014-02-04 LAB — FERRITIN: FERRITIN: 8 ng/mL — AB (ref 10–291)

## 2014-02-04 LAB — I-STAT CHEM 8, ED
BUN: 67 mg/dL — AB (ref 6–23)
Calcium, Ion: 1.08 mmol/L — ABNORMAL LOW (ref 1.13–1.30)
Chloride: 101 mEq/L (ref 96–112)
Creatinine, Ser: 4.2 mg/dL — ABNORMAL HIGH (ref 0.50–1.10)
GLUCOSE: 97 mg/dL (ref 70–99)
HCT: 24 % — ABNORMAL LOW (ref 36.0–46.0)
Hemoglobin: 8.2 g/dL — ABNORMAL LOW (ref 12.0–15.0)
Potassium: 3.5 mEq/L — ABNORMAL LOW (ref 3.7–5.3)
Sodium: 136 mEq/L — ABNORMAL LOW (ref 137–147)
TCO2: 18 mmol/L (ref 0–100)

## 2014-02-04 LAB — CREATININE, SERUM
Creatinine, Ser: 4 mg/dL — ABNORMAL HIGH (ref 0.50–1.10)
GFR calc non Af Amer: 10 mL/min — ABNORMAL LOW (ref >90)
GFR, EST AFRICAN AMERICAN: 11 mL/min — AB (ref >90)

## 2014-02-04 LAB — CREATININE, URINE, RANDOM: Creatinine, Urine: 95.32 mg/dL

## 2014-02-04 LAB — GLUCOSE, CAPILLARY
GLUCOSE-CAPILLARY: 87 mg/dL (ref 70–99)
GLUCOSE-CAPILLARY: 85 mg/dL (ref 70–99)
Glucose-Capillary: 98 mg/dL (ref 70–99)

## 2014-02-04 LAB — CBC
HCT: 18.6 % — ABNORMAL LOW (ref 36.0–46.0)
HEMOGLOBIN: 5.6 g/dL — AB (ref 12.0–15.0)
MCH: 19.6 pg — AB (ref 26.0–34.0)
MCHC: 30.1 g/dL (ref 30.0–36.0)
MCV: 65 fL — AB (ref 78.0–100.0)
Platelets: 230 10*3/uL (ref 150–400)
RBC: 2.86 MIL/uL — ABNORMAL LOW (ref 3.87–5.11)
RDW: 18.4 % — ABNORMAL HIGH (ref 11.5–15.5)
WBC: 7.6 10*3/uL (ref 4.0–10.5)

## 2014-02-04 LAB — RETICULOCYTES
RBC.: 2.85 MIL/uL — ABNORMAL LOW (ref 3.87–5.11)
RETIC COUNT ABSOLUTE: 37.1 10*3/uL (ref 19.0–186.0)
Retic Ct Pct: 1.3 % (ref 0.4–3.1)

## 2014-02-04 LAB — SODIUM, URINE, RANDOM: Sodium, Ur: 22 mEq/L

## 2014-02-04 LAB — CBG MONITORING, ED: GLUCOSE-CAPILLARY: 101 mg/dL — AB (ref 70–99)

## 2014-02-04 LAB — VITAMIN B12: Vitamin B-12: 684 pg/mL (ref 211–911)

## 2014-02-04 LAB — OSMOLALITY, URINE: Osmolality, Ur: 333 mOsm/kg — ABNORMAL LOW (ref 390–1090)

## 2014-02-04 LAB — URINE MICROSCOPIC-ADD ON

## 2014-02-04 LAB — I-STAT TROPONIN, ED: Troponin i, poc: 0.12 ng/mL (ref 0.00–0.08)

## 2014-02-04 LAB — FOLATE: Folate: >20 ng/mL

## 2014-02-04 MED ORDER — NITROGLYCERIN 0.4 MG SL SUBL: 0.4000 mg | SUBLINGUAL_TABLET | SUBLINGUAL | Status: DC | PRN

## 2014-02-04 MED ORDER — INSULIN GLARGINE 100 UNIT/ML ~~LOC~~ SOLN
16.0000 [IU] | Freq: Every day | SUBCUTANEOUS | Status: DC
  Filled 2014-02-04 (×2): qty 0.16

## 2014-02-04 MED ORDER — FERROUS SULFATE 325 (65 FE) MG PO TABS
325.0000 mg | ORAL_TABLET | Freq: Two times a day (BID) | ORAL | Status: DC
  Filled 2014-02-04 (×6): qty 1

## 2014-02-04 MED ORDER — AMLODIPINE BESYLATE 10 MG PO TABS
10.0000 mg | ORAL_TABLET | Freq: Every day | ORAL | Status: DC
  Filled 2014-02-04 (×3): qty 1

## 2014-02-04 MED ORDER — SODIUM CHLORIDE 0.9 % IV SOLN
80.0000 mg | Freq: Once | INTRAVENOUS | Status: AC
  Administered 2014-02-04: 80 mg via INTRAVENOUS
  Filled 2014-02-04: qty 80

## 2014-02-04 MED ORDER — ASPIRIN 300 MG RE SUPP: 150.0000 mg | Freq: Every day | RECTAL | Status: DC

## 2014-02-04 MED ORDER — METOPROLOL SUCCINATE ER 25 MG PO TB24
25.0000 mg | ORAL_TABLET | Freq: Every day | ORAL | Status: DC
  Filled 2014-02-04 (×2): qty 1

## 2014-02-04 MED ORDER — TRAZODONE 25 MG HALF TABLET
25.0000 mg | ORAL_TABLET | Freq: Every day | ORAL | Status: DC
  Filled 2014-02-04 (×3): qty 1

## 2014-02-04 MED ORDER — LORATADINE 10 MG PO TABS
10.0000 mg | ORAL_TABLET | Freq: Every day | ORAL | Status: DC
  Filled 2014-02-04 (×3): qty 1

## 2014-02-04 MED ORDER — PANTOPRAZOLE SODIUM 40 MG IV SOLR
40.0000 mg | Freq: Two times a day (BID) | INTRAVENOUS | Status: DC
  Filled 2014-02-04: qty 40

## 2014-02-04 MED ORDER — SODIUM CHLORIDE 0.9 % IJ SOLN
3.0000 mL | Freq: Two times a day (BID) | INTRAMUSCULAR | Status: DC
  Administered 2014-02-04 – 2014-02-07 (×4): 3 mL via INTRAVENOUS

## 2014-02-04 MED ORDER — ONDANSETRON HCL 4 MG/2ML IJ SOLN: 4.0000 mg | Freq: Four times a day (QID) | INTRAMUSCULAR | Status: DC | PRN

## 2014-02-04 MED ORDER — SODIUM CHLORIDE 0.9 % IV BOLUS (SEPSIS)
500.0000 mL | Freq: Once | INTRAVENOUS | Status: AC
  Administered 2014-02-04: 500 mL via INTRAVENOUS

## 2014-02-04 MED ORDER — PANTOPRAZOLE SODIUM 40 MG PO TBEC: 40.0000 mg | DELAYED_RELEASE_TABLET | Freq: Two times a day (BID) | ORAL | Status: DC

## 2014-02-04 MED ORDER — NITROGLYCERIN 2 % TD OINT
0.5000 [in_us] | TOPICAL_OINTMENT | Freq: Four times a day (QID) | TRANSDERMAL | Status: DC
  Administered 2014-02-05 – 2014-02-06 (×6): 0.5 [in_us] via TOPICAL
  Filled 2014-02-04: qty 30

## 2014-02-04 MED ORDER — PANTOPRAZOLE SODIUM 40 MG IV SOLR: 40.0000 mg | Freq: Two times a day (BID) | INTRAVENOUS | Status: DC

## 2014-02-04 MED ORDER — INSULIN ASPART 100 UNIT/ML ~~LOC~~ SOLN
0.0000 [IU] | SUBCUTANEOUS | Status: DC
Start: 2014-02-04 — End: 2014-02-05

## 2014-02-04 MED ORDER — ALUM & MAG HYDROXIDE-SIMETH 200-200-20 MG/5ML PO SUSP: 30.0000 mL | Freq: Four times a day (QID) | ORAL | Status: DC | PRN

## 2014-02-04 MED ORDER — HEPARIN SODIUM (PORCINE) 5000 UNIT/ML IJ SOLN: 5000.0000 [IU] | Freq: Three times a day (TID) | INTRAMUSCULAR | Status: DC

## 2014-02-04 MED ORDER — METOPROLOL TARTRATE 1 MG/ML IV SOLN: 5.0000 mg | INTRAVENOUS | Status: DC | PRN

## 2014-02-04 MED ORDER — ONDANSETRON HCL 4 MG PO TABS: 4.0000 mg | ORAL_TABLET | Freq: Four times a day (QID) | ORAL | Status: DC | PRN

## 2014-02-04 MED ORDER — GUAIFENESIN-DM 100-10 MG/5ML PO SYRP: 5.0000 mL | ORAL_SOLUTION | ORAL | Status: DC | PRN

## 2014-02-04 MED ORDER — ASPIRIN 81 MG PO CHEW: 81.0000 mg | CHEWABLE_TABLET | Freq: Every day | ORAL | Status: DC

## 2014-02-04 MED ORDER — ATORVASTATIN CALCIUM 10 MG PO TABS
10.0000 mg | ORAL_TABLET | Freq: Every day | ORAL | Status: DC
  Filled 2014-02-04 (×3): qty 1

## 2014-02-04 MED ORDER — HYDRALAZINE HCL 20 MG/ML IJ SOLN: 10.0000 mg | Freq: Four times a day (QID) | INTRAMUSCULAR | Status: DC | PRN

## 2014-02-04 MED ORDER — SODIUM CHLORIDE 0.9 % IV SOLN
8.0000 mg/h | INTRAVENOUS | Status: DC
  Administered 2014-02-04 – 2014-02-06 (×3): 8 mg/h via INTRAVENOUS
  Filled 2014-02-04 (×7): qty 80

## 2014-02-04 MED ORDER — POLYETHYLENE GLYCOL 3350 17 G PO PACK
17.0000 g | PACK | Freq: Every day | ORAL | Status: DC | PRN
  Filled 2014-02-04: qty 1

## 2014-02-04 MED ORDER — POTASSIUM CHLORIDE IN NACL 20-0.9 MEQ/L-% IV SOLN
INTRAVENOUS | Status: DC
  Administered 2014-02-04 – 2014-02-05 (×2): via INTRAVENOUS
  Filled 2014-02-04 (×4): qty 1000

## 2014-02-04 NOTE — ED Notes (Signed)
[facility] presents with 80 yo female from home with AMS/diminished LOC.  Pt daughter states that pt fell on Wednesday and called daughter to tell her so.  Pt didn't c/o dizziness but family members noticed that she had weakness in legs.  Pt family noticed some breathing difficulty and got her a new inhaler.  CBG 161 by [facility].  Responds to verbal stimuli.  Pt according to family is usually ambulatory and provides her own ADLs.

## 2014-02-04 NOTE — Progress Notes (Signed)
[facility] hospitalist progress note. Chief complaint. Anemia. History of present illness. This 80 year old female presented to [facility] and was admitted with increased confusion and decreased responsiveness. She had been noted to have multiple falls at home. She was admitted with metabolic encephalopathy, possible non-STEMI, acute renal failure, and anemia with a hemoglobin at admission of 8.2. Patient does have a history of GI bleeding in 2012 resulting from erosive gastritis. A followup hemoglobin has been obtained noting decreased to 5.6. Her family states there has been no evidence of bruising status post falls. She's had no red blood per rectum and her stools have not looked particularly black or tarry. Patient herself is still very lethargic and a poor historian. I did obtain a stool specimen per digital rectal exam and tests occult blood positive. Vital signs. Temperature 97.3, pulse 65, respiration 18, blood pressure 126/50. O2 sats 99%. General appearance. Frail elderly female who is somnolent but arousable with stimulation. She is nonverbal over the course of exam. Cardiac. Rate and rhythm regular. Lungs. Breath sounds clear and equal. Abdomen. Soft with somewhat hypotonic bowel sounds. No pain evident with palpation. Skin and extremities. No evidence of bruising or hematoma seen. Impression/plan. Problem #1. Transfuse will anemia. No evidence of hematoma. Stool does test occult blood positive at the bedside. Orders already in place to transfuse 2 units of packed red blood cells. Patient initiated on a Protonix drip. CT scan of the abdomen and pelvis is pending. No evidence of a red blood per rectum per digital exam. We'll follow for her CT scan results and will follow for repeat CBC post transfusion. The patient may benefit from a GI consult will defer this to the morning rounding physician. Patient clinically stable per bedside exam.

## 2014-02-04 NOTE — H&P (Addendum)
Patient Demographics  [name], is a 80 y.o. female  MRN: [id number]   DOB - [birth year]  Admit Date - 02/04/2014  Outpatient Primary MD for the patient is [name], MD   With History of -  Past Medical History  Diagnosis Date  . Asthma   . CAD (coronary artery disease)     Pt reports MI in 2006 (no documentation).  Cardiolite in 05/2002 and 07/2006 did not reveal any reversible ischemia.  Pt follows with Dr. Rex Kras at [facility]  . CHF (congestive heart failure)     EF 25-30% with dilated LV, mild LVH, severe hypokinesis, and mod-severe reduction in RV function  . Osteoporosis   . HYPERTENSION 08/03/2006  . GASTROPARESIS, DIABETIC 08/03/2006  . HYPERLIPIDEMIA 08/03/2006  . OBSTRUCTIVE SLEEP APNEA 01/06/2008  . PERIPHERAL NEUROPATHY 08/03/2006  . GERD 08/03/2006  . LOW BACK PAIN, CHRONIC 08/03/2006  . OSTEOPOROSIS 03/21/2009  . CEREBRAL EMBOLISM, WITH INFARCTION 07/02/2010  . Angina   . Myocardial infarction "2 or 3"  . Pneumonia 02/26/12    "a few times; probably even today"  . Shortness of breath     "all the time"  . DIABETES MELLITUS, TYPE II 11/04/1983  . Blood transfusion 08/2011  . Lower GI bleeding 08/2011  . Chronic daily headache   . Migraines   . Stroke summer 2011    "made my left hip worse"  . Uterine cancer   . Nonischemic cardiomyopathy   . Pulmonary hypertension   . Hypertension       Past Surgical History  Procedure Laterality Date  . Esophagogastroduodenoscopy  08/26/2011    Procedure: ESOPHAGOGASTRODUODENOSCOPY (EGD);  Surgeon: [name], MD;  Location: [facility] ENDOSCOPY;  Service: Endoscopy;  Laterality: N/A;  . Colonoscopy  08/28/2011    Procedure: COLONOSCOPY;  Surgeon: [name], MD;  Location: [place];  Service: Endoscopy;  Laterality: N/A;  . Vaginal hysterectomy     . Tubal ligation    . Cataract extraction w/ intraocular lens  implant, bilateral    . Toe surgery      "right big toe; operated on it to straighten it out; it was under"  . Polysomnogram  10/17/2005    AHI-7.28/hr. AHI REM-20.8/hr. Average oxygen saturation range during REM and NREM was 97%. Lowest oxygen saturation during REM sleep was 90%.  . Carotid duplex  05/28/2010    No significant extracranial carotid artery stenosis demonstrated. Vertebrals are patent w/ antegrade flow.  . Cardiac catheterization  05/24/2010    No intervention - recommed medical therapy.  . Cardiovascular stress test  08/07/2006    Moderate-severe defect seen in Basal inferior, Mid inferoseptal, Mid inferior, Mid inferolateral, and Apical inferior regions - consistent w/ infarct/scar. No scintigraphic evidence of inducible myocardial ischemia.  . Transthoracic echocardiogram  08/29/2011    EF 55-60%, moderate LVH,     in for   Chief Complaint  Patient presents with  . Altered Mental Status  .  Diminished LOC      HPI  [name]  is a 80 y.o. female, lives at home with her son with history of nonischemic cardiomyopathy, chronic combined systolic and diastolic CHF EF [id number] this year, obstructive sleep apnea, hypertension, type 2 diabetes mellitus now insulin-dependent, diabetic gastroparesis, dyslipidemia, hypertension, CVA in the past, GI bleed in 2012 due to erosive gastritis, who was recently admitted for CHF and was discharged home on ACE inhibitor along with diuretic comes to the hospital after feeling weak for the last few weeks, few falls at home, at baseline she uses a walker and has early dementia. Since yesterday she has been increasingly confused and less responsive, EMS brought her to the ER.   In the ER she was found to be obtunded, workup was suggestive of dehydration with acute renal failure, CT head chest x-ray was unremarkable. Troponin I stat was mildly elevated. EKG was nonacute and I was called  to admit the patient.    Review of Systems    Patient is a candidate review of systems unavailable, answering questions unreliably.   Social History History  Substance Use Topics  . Smoking status: Never Smoker   . Smokeless tobacco: Former Systems developer    Types: Snuff    Quit date: 10/13/2006     Comment: 02/26/12 "stopped snuff 4-6 years ago"  . Alcohol Use: No     Comment: "stopped drinking alcohol ~ 1980's"      Family History Family History  Problem Relation Age of Onset  . Diabetes insipidus Mother   . Hypertension Mother   . Hypertension Father   . Hypertension Sister   . Hypertension Child       Prior to Admission medications   Medication Sig Start Date End Date Taking? Authorizing Provider  acetaminophen (TYLENOL) 500 MG tablet Take 500 mg by mouth every 6 (six) hours as needed for moderate pain or headache.    Historical Provider, MD  albuterol (PROAIR HFA) 108 (90 BASE) MCG/ACT inhaler Inhale 2 puffs into the lungs every 6 (six) hours as needed for wheezing. 10/12/12   Karren Cobble, MD  amLODipine (NORVASC) 10 MG tablet Take 1 tablet (10 mg total) by mouth daily. 02/07/13   Karren Cobble, MD  aspirin 81 MG EC tablet Take 81 mg by mouth daily.      Historical Provider, MD  BD INSULIN SYRINGE ULTRAFINE 31G X 5/16" 0.3 ML MISC  11/19/13   Historical Provider, MD  calcium-vitamin D (OSCAL WITH D) 500-200 MG-UNIT per tablet Take 1 tablet by mouth 3 (three) times daily. 12/20/10   Nils Pyle, MD  ferrous sulfate 325 (65 FE) MG tablet Take 325 mg by mouth 2 (two) times daily with a meal. 08/29/11 12/12/13  Clarene Duke, MD  insulin aspart (NOVOLOG FLEXPEN) 100 UNIT/ML FlexPen Inject 5 Units into the skin 3 (three) times daily with meals.    Historical Provider, MD  insulin glargine (LANTUS) 100 UNIT/ML injection Inject 32 Units into the skin at bedtime.  12/12/11 12/11/14  Nils Pyle, MD  isosorbide mononitrate (IMDUR) 60 MG 24 hr tablet Take 1 tablet (60 mg total)  by mouth daily. 02/07/13   Karren Cobble, MD  lisinopril (PRINIVIL,ZESTRIL) 5 MG tablet Take 5 mg by mouth daily.    Historical Provider, MD  loratadine (ALLERGY RELIEF) 10 MG tablet Take 10 mg by mouth daily.    Historical Provider, MD  metoprolol succinate (TOPROL-XL) 25 MG 24 hr tablet Take 1  tablet (25 mg total) by mouth daily. 12/05/13 12/05/14  Annita Brod, MD  Multiple Vitamins-Minerals (ALIVE WOMENS 50+ PO) Take 1 tablet by mouth daily.    Historical Provider, MD  nitroGLYCERIN (NITROSTAT) 0.4 MG SL tablet Place 1 tablet (0.4 mg total) under the tongue every 5 (five) minutes as needed. For chest pain. 03/15/12   Nils Pyle, MD  potassium chloride (K-DUR) 10 MEQ tablet Take 10 mEq by mouth daily.    Historical Provider, MD  rosuvastatin (CRESTOR) 20 MG tablet Take 1 tablet (20 mg total) by mouth daily. 02/07/13   Karren Cobble, MD  senna (SENOKOT) 8.6 MG tablet Take 1-2 tablets by mouth daily as needed. For constipation. 12/20/10   Nils Pyle, MD  torsemide (DEMADEX) 10 MG tablet Take 10 mg by mouth daily.    Historical Provider, MD  traZODone (DESYREL) 50 MG tablet Take 25 mg by mouth at bedtime. 12/12/11   Nils Pyle, MD    Allergies  Allergen Reactions  . Baking Soda-Fluoride [Sodium Fluoride] Nausea And Vomiting  . Magnesium Hydroxide Nausea And Vomiting    Physical Exam  Vitals  Blood pressure 130/37, pulse 61, temperature 98.5 F (36.9 C), temperature source Oral, resp. rate 20, last menstrual period 12/19/1968, SpO2 100.00%.   1. General elderly African American female lying in bed in NAD but close to being completely obtunded, opens eyes and will move extremities to painful stimuli, answering a few questions intermittently unreliably.  2. neuro exam limited but moves all 4 extremities by self and to painful stimuli  3. bilaterally Plantars down going.  4. Ears and Eyes appear Normal, Conjunctivae clear, PERRLA. Dry  Oral Mucosa.  5. Supple Neck, No  JVD, No cervical lymphadenopathy appriciated, No Carotid Bruits.  6. Symmetrical Chest wall movement, Good air movement bilaterally, CTAB.  7. RRR, No Gallops, Rubs or Murmurs, No Parasternal Heave.  8. Positive Bowel Sounds, Abdomen Soft, Non tender, No organomegaly appriciated,No rebound -guarding or rigidity.  9.  No Cyanosis, Normal Skin Turgor, No Skin Rash or Bruise.  10. Good muscle tone,  joints appear normal , no effusions, Normal ROM.  11. No Palpable Lymph Nodes in Neck or Axillae     Data Review  CBC  Recent Labs Lab 02/04/14 1158 02/04/14 1209  WBC 9.1  --   HGB 6.2* 8.2*  HCT 19.9* 24.0*  PLT 283  --   MCV 65.0*  --   MCH 20.3*  --   MCHC 31.2  --   RDW 18.3*  --   LYMPHSABS 2.0  --   MONOABS 0.8  --   EOSABS 0.0  --   BASOSABS 0.0  --    ------------------------------------------------------------------------------------------------------------------  Chemistries   Recent Labs Lab 02/04/14 1209  NA 136*  K 3.5*  CL 101  GLUCOSE 97  BUN 67*  CREATININE 4.20*   ------------------------------------------------------------------------------------------------------------------ CrCl is unknown because both a height and weight (above a minimum accepted value) are required for this calculation. ------------------------------------------------------------------------------------------------------------------ No results found for this basename: TSH, T4TOTAL, FREET3, T3FREE, THYROIDAB,  in the last 72 hours   Coagulation profile No results found for this basename: INR, PROTIME,  in the last 168 hours ------------------------------------------------------------------------------------------------------------------- No results found for this basename: DDIMER,  in the last 72 hours -------------------------------------------------------------------------------------------------------------------  Cardiac Enzymes No results found for this basename: CK,  CKMB, TROPONINI, MYOGLOBIN,  in the last 168 hours ------------------------------------------------------------------------------------------------------------------ No components found with this basename: POCBNP,    ---------------------------------------------------------------------------------------------------------------  Urinalysis  Component Value Date/Time   COLORURINE YELLOW 02/04/2014 [place] 02/04/2014 1203   LABSPEC 1.018 02/04/2014 1203   PHURINE 5.0 02/04/2014 1203   GLUCOSEU NEGATIVE 02/04/2014 1203   GLUCOSEU NEG mg/dL 06/21/2010 1103   HGBUR NEGATIVE 02/04/2014 [place] 02/04/2014 1203   [place] 02/04/2014 1203   PROTEINUR 100* 02/04/2014 1203   UROBILINOGEN 0.2 02/04/2014 1203   NITRITE NEGATIVE 02/04/2014 1203   LEUKOCYTESUR NEGATIVE 02/04/2014 1203    ----------------------------------------------------------------------------------------------------------------  Imaging results:   Dg Chest 2 View  02/04/2014   CLINICAL DATA:  Altered mental status, history of myocardial infarction and pneumonia  EXAM: CHEST  2 VIEW  COMPARISON:  DG CHEST 1V PORT dated 12/03/2013  FINDINGS: Moderate enlargement of cardiac silhouette. Vascular pattern is normal. No consolidation or effusion.  IMPRESSION: Moderate enlargement of the cardiac silhouette.  Lungs clear.   Electronically Signed   By: Skipper Cliche M.D.   On: 02/04/2014 12:18   Ct Head Wo Contrast  02/04/2014   CLINICAL DATA:  Altered mental status, weakness  EXAM: CT HEAD WITHOUT CONTRAST  TECHNIQUE: Contiguous axial images were obtained from the base of the skull through the vertex without intravenous contrast.  COMPARISON:  None.  FINDINGS: No evidence of parenchymal hemorrhage or extra-axial fluid collection. No mass lesion, mass effect, or midline shift.  No CT evidence of acute infarction.  Subcortical white matter and periventricular small vessel ischemic changes. Intracranial  atherosclerosis.  Age related atrophy.  No ventriculomegaly.  Partial opacification of the right frontal sinus. Visualized paranasal sinuses and mastoid air cells are otherwise clear.  No evidence of calvarial fracture.  IMPRESSION: No evidence of acute intracranial abnormality.  Age related atrophy with small vessel ischemic changes and intracranial atherosclerosis.   Electronically Signed   By: Julian Hy M.D.   On: 02/04/2014 12:44    My personal review of EKG: Rhythm NSR, no Acute ST changes, few PVCs    Assessment & Plan   1. Metabolic encephalopathy with generalized weakness and multiple falls -  due to severe dehydration and acute renal failure, plan is to admit the patient on telemetry bed, hold diuretic and ACE inhibitor, IV fluids, CT head is unremarkable. If no improvement with supportive care and 24 hours we'll broaden her workup. If better PT eval in the morning. Will have social work to evaluate for placement.    2.NSTEMI - troponin could be falsely elevated due to acute renal failure, EKG is nonacute, she's not a candidate for left heart catheterization or invasive procedure due to her renal failure at this time. Case was discussed with cardiologist on call Dr. Rayann Heman who agrees with medical management which will include aspirin, beta blocker, statin along with as needed sublingual nitroglycerin in case she develops chest pain. If troponin trend rises we will formally consult cardiology.    3. Nonischemic cardiomyopathy with chronic combined systolic and diastolic heart failure EF 45% this year. Currently she is clearly dehydrated, IV fluids, hold diuretic and ACE inhibitor. Telemetry monitor    4. Diverticulitis type II. Due to decreased mental status she is n.p.o. currently, we'll cut her Lantus and has, sliding scale insulin every 4 hours and monitor. We'll check A1c.    5. Generalized weakness-early dementia. Currently much worse due to #1 above, supportive care  for now, eventually 82 eval and social work consult both have been ordered.    6. Acute renal failure. Appears to be secondary to dehydration, urine electrolytes  have been ordered, IV fluid, hold ACE inhibitor and other nephrotoxins, repeat BMP in the morning. Will place a Foley catheter to monitor I and O. Closely.    7. Dyslipidemia. Home dose statin if able to take by mouth.    8. Anemia. Has history of GI bleed in the past due to erosive gastritis, will be placed on IV PPI, with dilution can expect some falling H&H, anemia panel has been ordered, monitor H&H. Goal will be to keep her hemoglobin around 7.5 in the light of her underlying heart problems as dictated above. No history suggestive of ongoing bleed    9. Obstructive sleep apnea. Per family not on CPAP.     Addendum - was called at 6.50 pm -  repeat H&H 5.6, h/o UGI bleed, PPI drip, 2 units, CT abd Pelvis R/O RP bleed, check occult stool. Stop ASA and Heparin, night team requested to monitor closely.     DVT Prophylaxis Heparin   SCDs    AM Labs Ordered, also please review Full Orders  Family Communication: Admission, patients condition and plan of care including tests being ordered have been discussed with the patient and daughter who indicate understanding and agree with the plan and Code Status.  Code Status DNR  Likely DC to  TBD  Condition GUARDED     Time spent in minutes : 35    Thurnell Lose M.D on 02/04/2014 at 1:31 PM  Between 7am to 7pm - Pager - [phone]  After 7pm go to [email or web address] - password [email or web address]  And look for the night coverage person covering me after hours  [facility] Office  [phone]

## 2014-02-04 NOTE — ED Notes (Signed)
NOTIFIED DR. LOCKWOOD IN PERSON OF PATIENTS PANIC LAB RESULTS OF I-STAT TROPONIN, @12 :20 PM ,02/04/2014.

## 2014-02-04 NOTE — Progress Notes (Signed)
Hgb reported as 5.5 earlier. Lab repeated due to large drop since tis am. Repeat lab 5.6. Dr Candiss Norse made aware. Orders recieved

## 2014-02-04 NOTE — ED Provider Notes (Signed)
CSN: [id number]     Arrival date & time 02/04/14  1124 History   First MD Initiated Contact with Patient 02/04/14 1125     Chief Complaint  Patient presents with  . Altered Mental Status  . Diminished LOC      (Consider location/radiation/quality/duration/timing/severity/associated sxs/prior Treatment) HPI Comments: Pt brought in by ems from home. Family states that pt woke up altered this morning. Family states that when she went bed last night she was oriented to self and place. Pt is not able to get out of bed today. Family states that she has fallen twice in the last week. No loc with falls but pt legs seem to get week and give out on her   Past Medical History  Diagnosis Date  . Asthma   . CAD (coronary artery disease)     Pt reports MI in 2006 (no documentation).  Cardiolite in 05/2002 and 07/2006 did not reveal any reversible ischemia.  Pt follows with Dr. Rex Kras at [facility].  . CHF (congestive heart failure)     EF 25-30% with dilated LV, mild LVH, severe hypokinesis, and mod-severe reduction in RV function  . Osteoporosis   . HYPERTENSION 08/03/2006  . GASTROPARESIS, DIABETIC 08/03/2006  . HYPERLIPIDEMIA 08/03/2006  . OBSTRUCTIVE SLEEP APNEA 01/06/2008  . PERIPHERAL NEUROPATHY 08/03/2006  . GERD 08/03/2006  . LOW BACK PAIN, CHRONIC 08/03/2006  . OSTEOPOROSIS 03/21/2009  . CEREBRAL EMBOLISM, WITH INFARCTION 07/02/2010  . Angina   . Myocardial infarction "2 or 3"  . Pneumonia 02/26/12    "a few times; probably even today"  . Shortness of breath     "all the time"  . DIABETES MELLITUS, TYPE II 11/04/1983  . Blood transfusion 08/2011  . Lower GI bleeding 08/2011  . Chronic daily headache   . Migraines   . Stroke summer 2011    "made my left hip worse"  . Uterine cancer   . Nonischemic cardiomyopathy   . Pulmonary hypertension   . Hypertension    Past Surgical History  Procedure Laterality Date  . Esophagogastroduodenoscopy  08/26/2011    Procedure:  ESOPHAGOGASTRODUODENOSCOPY (EGD);  Surgeon: [name], MD;  Location: [facility] ENDOSCOPY;  Service: Endoscopy;  Laterality: N/A;  . Colonoscopy  08/28/2011    Procedure: COLONOSCOPY;  Surgeon: [name], MD;  Location: [place];  Service: Endoscopy;  Laterality: N/A;  . Vaginal hysterectomy    . Tubal ligation    . Cataract extraction w/ intraocular lens  implant, bilateral    . Toe surgery      "right big toe; operated on it to straighten it out; it was under"  . Polysomnogram  10/17/2005    AHI-7.28/hr. AHI REM-20.8/hr. Average oxygen saturation range during REM and NREM was 97%. Lowest oxygen saturation during REM sleep was 90%.  . Carotid duplex  05/28/2010    No significant extracranial carotid artery stenosis demonstrated. Vertebrals are patent w/ antegrade flow.  . Cardiac catheterization  05/24/2010    No intervention - recommed medical therapy.  . Cardiovascular stress test  08/07/2006    Moderate-severe defect seen in Basal inferior, Mid inferoseptal, Mid inferior, Mid inferolateral, and Apical inferior regions - consistent w/ infarct/scar. No scintigraphic evidence of inducible myocardial ischemia.  . Transthoracic echocardiogram  08/29/2011    EF 55-60%, moderate LVH,    Family History  Problem Relation Age of Onset  . Diabetes insipidus Mother   . Hypertension Mother   . Hypertension Father   . Hypertension Sister   .  Hypertension Child    History  Substance Use Topics  . Smoking status: Never Smoker   . Smokeless tobacco: Former Systems developer    Types: Snuff    Quit date: 10/13/2006     Comment: 02/26/12 "stopped snuff 4-6 years ago"  . Alcohol Use: No     Comment: "stopped drinking alcohol ~ 1980's"   OB History   Grav Para Term Preterm Abortions TAB SAB Ect Mult Living                 Review of Systems  Unable to perform ROS: Mental status change      Allergies  Baking soda-fluoride and Magnesium hydroxide  Home Medications   Prior to Admission  medications   Medication Sig Start Date End Date Taking? Authorizing Provider  acetaminophen (TYLENOL) 500 MG tablet Take 500 mg by mouth every 6 (six) hours as needed for moderate pain or headache.    Historical Provider, MD  albuterol (PROAIR HFA) 108 (90 BASE) MCG/ACT inhaler Inhale 2 puffs into the lungs every 6 (six) hours as needed for wheezing. 10/12/12   Karren Cobble, MD  amLODipine (NORVASC) 10 MG tablet Take 1 tablet (10 mg total) by mouth daily. 02/07/13   Karren Cobble, MD  aspirin 81 MG EC tablet Take 81 mg by mouth daily.      Historical Provider, MD  BD INSULIN SYRINGE ULTRAFINE 31G X 5/16" 0.3 ML MISC  11/19/13   Historical Provider, MD  calcium-vitamin D (OSCAL WITH D) 500-200 MG-UNIT per tablet Take 1 tablet by mouth 3 (three) times daily. 12/20/10   Nils Pyle, MD  ferrous sulfate 325 (65 FE) MG tablet Take 325 mg by mouth 2 (two) times daily with a meal. 08/29/11 12/12/13  Clarene Duke, MD  insulin aspart (NOVOLOG FLEXPEN) 100 UNIT/ML FlexPen Inject 5 Units into the skin 3 (three) times daily with meals.    Historical Provider, MD  insulin glargine (LANTUS) 100 UNIT/ML injection Inject 32 Units into the skin at bedtime.  12/12/11 12/11/14  Nils Pyle, MD  isosorbide mononitrate (IMDUR) 60 MG 24 hr tablet Take 1 tablet (60 mg total) by mouth daily. 02/07/13   Karren Cobble, MD  lisinopril (PRINIVIL,ZESTRIL) 5 MG tablet Take 5 mg by mouth daily.    Historical Provider, MD  loratadine (ALLERGY RELIEF) 10 MG tablet Take 10 mg by mouth daily.    Historical Provider, MD  metoprolol succinate (TOPROL-XL) 25 MG 24 hr tablet Take 1 tablet (25 mg total) by mouth daily. 12/05/13 12/05/14  Annita Brod, MD  Multiple Vitamins-Minerals (ALIVE WOMENS 50+ PO) Take 1 tablet by mouth daily.    Historical Provider, MD  nitroGLYCERIN (NITROSTAT) 0.4 MG SL tablet Place 1 tablet (0.4 mg total) under the tongue every 5 (five) minutes as needed. For chest pain. 03/15/12   Nils Pyle,  MD  potassium chloride (K-DUR) 10 MEQ tablet Take 10 mEq by mouth daily.    Historical Provider, MD  rosuvastatin (CRESTOR) 20 MG tablet Take 1 tablet (20 mg total) by mouth daily. 02/07/13   Karren Cobble, MD  senna (SENOKOT) 8.6 MG tablet Take 1-2 tablets by mouth daily as needed. For constipation. 12/20/10   Nils Pyle, MD  torsemide (DEMADEX) 10 MG tablet Take 10 mg by mouth daily.    Historical Provider, MD  traZODone (DESYREL) 50 MG tablet Take 25 mg by mouth at bedtime. 12/12/11   Nils Pyle, MD   BP  136/40  Pulse 66  Temp(Src) 98.7 F (37.1 C) (Oral)  Resp 20  SpO2 97%  LMP 12/19/1968 Physical Exam  Nursing note and vitals reviewed. Constitutional: She appears well-developed and well-nourished.  HENT:  Head: Normocephalic and atraumatic.  Cardiovascular: Normal rate.   Pulmonary/Chest: Effort normal and breath sounds normal.  Abdominal: Soft. Bowel sounds are normal. There is no tenderness.  Musculoskeletal:  Right grip strength may be worse. Will follow  Some commands  Neurological:  No oriented to person, place or time  Skin: Skin is warm and dry.    ED Course  Procedures (including critical care time) Labs Review Labs Reviewed  CBG MONITORING, ED - Abnormal; Notable for the following:    Glucose-Capillary 101 (*)    All other components within normal limits  CBC WITH DIFFERENTIAL  URINALYSIS, ROUTINE W REFLEX MICROSCOPIC  I-STAT CHEM 8, ED  I-STAT TROPOININ, ED    Imaging Review No results found.   EKG Interpretation   Date/Time:  Saturday February 04 2014 11:52:40 EDT Ventricular Rate:  65 PR Interval:    QRS Duration: 101 QT Interval:  410 QTC Calculation: 426 R Axis:   18 Text Interpretation:  Atrial fibrillation Multiple ventricular premature  complexes Borderline repolarization abnormality Atrial fibrillation  Premature ventricular complexes T wave abnormality Abnormal ekg Confirmed  by Carmin Muskrat  MD [phone]) on 02/04/2014 11:57:00  AM      MDM   Final diagnoses:  Acute renal failure  Anemia  Elevated troponin    Spoke with Dr. Rayann Heman with cardiology and he states that he spoke with hospitalist and they thought they could handle the trop. Pt to be admitted by hospitalist for new anemia, renal failure which is the likely cause of pt altered mental status. Family aware of plan to admit and they agree with plan    Glendell Docker, NP 02/04/14 1406

## 2014-02-05 ENCOUNTER — Inpatient Hospital Stay (HOSPITAL_COMMUNITY): Payer: Medicare Other

## 2014-02-05 ENCOUNTER — Encounter (HOSPITAL_COMMUNITY): Payer: Self-pay | Admitting: Adult Health

## 2014-02-05 DIAGNOSIS — D649 Anemia, unspecified: Secondary | ICD-10-CM

## 2014-02-05 DIAGNOSIS — R4182 Altered mental status, unspecified: Secondary | ICD-10-CM

## 2014-02-05 DIAGNOSIS — I5031 Acute diastolic (congestive) heart failure: Secondary | ICD-10-CM

## 2014-02-05 LAB — TROPONIN I
TROPONIN I: 0.58 ng/mL — AB (ref <0.30)
TROPONIN I: 0.83 ng/mL — AB (ref <0.30)
Troponin I: 1.83 ng/mL (ref <0.30)

## 2014-02-05 LAB — GLUCOSE, CAPILLARY
GLUCOSE-CAPILLARY: 149 mg/dL — AB (ref 70–99)
GLUCOSE-CAPILLARY: 129 mg/dL — AB (ref 70–99)
Glucose-Capillary: 115 mg/dL — ABNORMAL HIGH (ref 70–99)
Glucose-Capillary: 148 mg/dL — ABNORMAL HIGH (ref 70–99)
Glucose-Capillary: 107 mg/dL — ABNORMAL HIGH (ref 70–99)

## 2014-02-05 LAB — COMPREHENSIVE METABOLIC PANEL
ALT: 53 U/L — ABNORMAL HIGH (ref 0–35)
AST: 97 U/L — ABNORMAL HIGH (ref 0–37)
Albumin: 3 g/dL — ABNORMAL LOW (ref 3.5–5.2)
Alkaline Phosphatase: 93 U/L (ref 39–117)
BILIRUBIN TOTAL: 1.2 mg/dL (ref 0.3–1.2)
BUN: 74 mg/dL — AB (ref 6–23)
CHLORIDE: 99 meq/L (ref 96–112)
CO2: 12 mEq/L — ABNORMAL LOW (ref 19–32)
Calcium: 8.6 mg/dL (ref 8.4–10.5)
Creatinine, Ser: 4.25 mg/dL — ABNORMAL HIGH (ref 0.50–1.10)
GFR calc Af Amer: 11 mL/min — ABNORMAL LOW (ref >90)
GFR, EST NON AFRICAN AMERICAN: 9 mL/min — AB (ref >90)
Glucose, Bld: 135 mg/dL — ABNORMAL HIGH (ref 70–99)
Potassium: 4.8 mEq/L (ref 3.7–5.3)
Sodium: 133 mEq/L — ABNORMAL LOW (ref 137–147)
Total Protein: 6.8 g/dL (ref 6.0–8.3)

## 2014-02-05 LAB — BASIC METABOLIC PANEL
BUN: 71 mg/dL — AB (ref 6–23)
CHLORIDE: 101 meq/L (ref 96–112)
CO2: 13 mEq/L — ABNORMAL LOW (ref 19–32)
CREATININE: 4.2 mg/dL — AB (ref 0.50–1.10)
Calcium: 8.5 mg/dL (ref 8.4–10.5)
GFR calc non Af Amer: 9 mL/min — ABNORMAL LOW (ref >90)
GFR, EST AFRICAN AMERICAN: 11 mL/min — AB (ref >90)
Glucose, Bld: 111 mg/dL — ABNORMAL HIGH (ref 70–99)
Potassium: 4.1 mEq/L (ref 3.7–5.3)
Sodium: 137 mEq/L (ref 137–147)

## 2014-02-05 LAB — CBC
HCT: 27.5 % — ABNORMAL LOW (ref 36.0–46.0)
Hemoglobin: 9.1 g/dL — ABNORMAL LOW (ref 12.0–15.0)
MCH: 23.3 pg — ABNORMAL LOW (ref 26.0–34.0)
MCHC: 33.1 g/dL (ref 30.0–36.0)
MCV: 70.5 fL — AB (ref 78.0–100.0)
Platelets: 145 10*3/uL — ABNORMAL LOW (ref 150–400)
RBC: 3.9 MIL/uL (ref 3.87–5.11)
RDW: 22.1 % — AB (ref 11.5–15.5)
WBC: 9.2 10*3/uL (ref 4.0–10.5)

## 2014-02-05 LAB — LACTIC ACID, PLASMA: LACTIC ACID, VENOUS: 1.5 mmol/L (ref 0.5–2.2)

## 2014-02-05 LAB — AMMONIA: Ammonia: 26 umol/L (ref 11–60)

## 2014-02-05 LAB — HEMOGLOBIN AND HEMATOCRIT, BLOOD
HCT: 25.6 % — ABNORMAL LOW (ref 36.0–46.0)
HEMOGLOBIN: 8.5 g/dL — AB (ref 12.0–15.0)

## 2014-02-05 LAB — HEMOGLOBIN A1C
Hgb A1c MFr Bld: 11.5 % — ABNORMAL HIGH (ref <5.7)
MEAN PLASMA GLUCOSE: 283 mg/dL — AB (ref <117)

## 2014-02-05 LAB — LIPASE, BLOOD: Lipase: 28 U/L (ref 11–59)

## 2014-02-05 LAB — MRSA PCR SCREENING: MRSA by PCR: NEGATIVE

## 2014-02-05 MED ORDER — INSULIN ASPART 100 UNIT/ML ~~LOC~~ SOLN
0.0000 [IU] | SUBCUTANEOUS | Status: DC
  Administered 2014-02-06 (×2): 2 [IU] via SUBCUTANEOUS
  Administered 2014-02-07: 5 [IU] via SUBCUTANEOUS
  Administered 2014-02-07: 2 [IU] via SUBCUTANEOUS
  Administered 2014-02-07: 7 [IU] via SUBCUTANEOUS
  Administered 2014-02-07: 3 [IU] via SUBCUTANEOUS
  Administered 2014-02-07: 5 [IU] via SUBCUTANEOUS
  Administered 2014-02-08: 7 [IU] via SUBCUTANEOUS
  Administered 2014-02-08: 2 [IU] via SUBCUTANEOUS
  Administered 2014-02-08: 5 [IU] via SUBCUTANEOUS
  Administered 2014-02-08: 3 [IU] via SUBCUTANEOUS
  Administered 2014-02-09: 5 [IU] via SUBCUTANEOUS
  Administered 2014-02-09: 7 [IU] via SUBCUTANEOUS
  Administered 2014-02-09: 5 [IU] via SUBCUTANEOUS
  Administered 2014-02-09 – 2014-02-10 (×5): 2 [IU] via SUBCUTANEOUS
  Administered 2014-02-10: 3 [IU] via SUBCUTANEOUS

## 2014-02-05 MED ORDER — STERILE WATER FOR INJECTION IV SOLN
INTRAVENOUS | Status: DC
  Administered 2014-02-05 – 2014-02-06 (×4): via INTRAVENOUS
  Filled 2014-02-05 (×4): qty 850

## 2014-02-05 MED ORDER — DEXTROSE 50 % IV SOLN
INTRAVENOUS | Status: AC
  Administered 2014-02-05: 25 mL
  Filled 2014-02-05: qty 50

## 2014-02-05 MED ORDER — METOPROLOL TARTRATE 1 MG/ML IV SOLN
2.5000 mg | Freq: Four times a day (QID) | INTRAVENOUS | Status: DC
  Administered 2014-02-05 – 2014-02-06 (×4): 2.5 mg via INTRAVENOUS
  Filled 2014-02-05 (×8): qty 5

## 2014-02-05 NOTE — ED Provider Notes (Signed)
  This was a shared visit with a mid-level provided (NP or PA).  Throughout the patient's course I was available for consultation/collaboration.  I saw the ECG (if appropriate), relevant labs and studies - I agree with the interpretation.  On my exam the patient was in no distress. However, the patient was disoriented, listless, and with concern for altered mental status has a broad evaluation. Patient is hemodynamically stable, but her evaluation demonstrates acute renal failure, acute change in hemoglobin level, both are likely contributory to her altered mental status. Given the patient's blood loss, she required a transfusion, admission for further evaluation and management. Additionally, the patient had an elevated troponin, but had no chest pain throughout her emergency department course. We discussed the elevated troponin with our cardiology colleagues, and given the absence of ongoing chest pain, she was not started on new anticoagulant, but will be monitored for this.   CRITICAL CARE Performed by: Carmin Muskrat Total critical care time: 30 Critical care time was exclusive of separately billable procedures and treating other patients. Critical care was necessary to treat or prevent imminent or life-threatening deterioration. Critical care was time spent personally by me on the following activities: development of treatment plan with patient and/or surrogate as well as nursing, discussions with consultants, evaluation of patient's response to treatment, examination of patient, obtaining history from patient or surrogate, ordering and performing treatments and interventions, ordering and review of laboratory studies, ordering and review of radiographic studies, pulse oximetry and re-evaluation of patient's condition.       Carmin Muskrat, MD 02/05/14 1331

## 2014-02-05 NOTE — Progress Notes (Signed)
Pt restless this AM. Rn placed tele lead on Pt back d/t Pt pulling off leads. Pt made eye contact with night RN will he called Pt name. Night RN stated this was an improvement. Pt responded to squeezing RN hand on command. Squeeze strength was weak.

## 2014-02-05 NOTE — Progress Notes (Signed)
[facility] PROGRESS NOTE  GENNA CASIMIR MVH:[id number] DOB: 1933/05/18 DOA: 02/04/2014 PCP: [name], MD  Assessment/Plan  Metabolic encephalopathy with generalized weakness and multiple falls, likely due to combination of acute renal failure, NSTEMI, dehydration, and metabolic acidosis, possible lactic acidosis, GI bleed -  Treat underlying medical problems -  Ammonia level -  Hold on sedating medications for now because recently obtunded -  UA neg -  CXR neg -  Head CT stable -  Consider MRI brain once more medically stable  Possible NSTEMI with hx of known CAD, troponins rising, case discussed with cardiology he recommends medical management. Given her comorbidities, particularly her renal failure, she is not a good candidate for left heart catheterization or invasive procedure -  Continue beta blocker, statin -  No ACE inhibitor secondary to acute renal failure -  Aspirin held because of concern for GIB -  Nitroglycerin when necessary -  Daily ECG -  Tele:  NSR with PVC -  Please continue telemetry when able  Acute renal failure, baseline creatinine 1.09, current creatinine clearance 13 -  No hydronephrosis on CT abdomen pelvis -  FENa 0.72% -  Given age and comorbidities, would likely not be a good candidate for hemodialysis -  Despite IV fluids overnight, her BUN and creatinine continue to rise -  Nephrology consultation  Acute blood loss anemia, Hemoccult-positive -  Initial hemoglobin 8.2, trended down to 5.6, but came up to 9.1 after only 2 units -  Low hemoglobin may have been somewhat spurious -  Continue Protonix infusion -  Repeat hemoglobin this afternoon -  If hemoglobin is approximately stable, no evidence of active bleeding, and patient more awake, advance diet and change Protonix to 40 IV twice a day  Abnl gallbladder with possible wall thickening on CT -  No abdominal pain on exam -  LFTs wnl  +anion gap metabolic acidosis, may be secondary to  elevated BUN, but rule out lactic acidosis.  May also have some underlying RTA from renal failure -  Check lactic acid -  Change to sodium bicarb containing fluids  DM2, A1c 11.5, CBG well controlled -  continus lantus 16 units -  Continue low dose SSI  ICM with EF 40-45% and grade 2 DD -  Judicious use of IVF -  Monitor for SOB -  BB okay, but ACEI on hold due to AKI  HTN, blood pressure mildly elevated -  Continue norvasc, metoprolol with prn metoprolol  Hyponatremia, worsened despite IVF  OSA, stable.  Not on CPAP  Hx of CVA with possible some cognitive deficits  Diet:  NPO Access:  PIV IVF:  yes Proph:  SCDs  Code Status: DNR Family Communication: patient and her daughter Disposition Plan: pending improvement in mentation and renal function   Consultants:  Cardiology  Nephrology  Procedures:  CXR  CT head  CT abd/pelvis  Antibiotics:  none   HPI/Subjective:  Patient awake but not able to answer questions or follow commands   Objective: Filed Vitals:   02/05/14 0311 02/05/14 0410 02/05/14 0513 02/05/14 0530  BP: 152/63 150/46 154/83 159/62  Pulse: 83 65 78 80  Temp: 98.7 F (37.1 C) 98.4 F (36.9 C) 98 F (36.7 C) 97.9 F (36.6 C)  TempSrc: Axillary Axillary Axillary Axillary  Resp: 19 17 22 22   Weight:    66 kg (145 lb 8.1 oz)  SpO2: 100% 98% 99% 98%    Intake/Output Summary (Last 24 hours) at 02/05/14 8413 Last  data filed at 02/05/14 0600  Gross per 24 hour  Intake    475 ml  Output    750 ml  Net   -275 ml   Filed Weights   02/05/14 0530  Weight: 66 kg (145 lb 8.1 oz)    Exam:   General:  African American female, restless No acute distress  HEENT:  NCAT, MMM  Cardiovascular:  RRR, nl S1, S2 no mrg, 2+ pulses, warm extremities  Respiratory:  CTAB, no increased WOB  Abdomen:   NABS, soft, NT/ND  MSK:   Normal tone and bulk, no LEE  Neuro:  Grossly moves all extremities  Data Reviewed: Basic Metabolic  Panel:  Recent Labs Lab 02/04/14 1209 02/04/14 1552 02/05/14 0115 02/05/14 0655  NA 136*  --  137 133*  K 3.5*  --  4.1 4.8  CL 101  --  101 99  CO2  --   --  13* 12*  GLUCOSE 97  --  111* 135*  BUN 67*  --  71* 74*  CREATININE 4.20* 4.00* 4.20* 4.25*  CALCIUM  --   --  8.5 8.6   Liver Function Tests:  Recent Labs Lab 02/05/14 0655  AST 97*  ALT 53*  ALKPHOS 93  BILITOT 1.2  PROT 6.8  ALBUMIN 3.0*   No results found for this basename: LIPASE, AMYLASE,  in the last 168 hours No results found for this basename: AMMONIA,  in the last 168 hours CBC:  Recent Labs Lab 02/04/14 1158 02/04/14 1209 02/04/14 1810 02/05/14 0655  WBC 9.1  --  7.6 9.2  NEUTROABS 6.3  --   --   --   HGB 6.2* 8.2* 5.6* 9.1*  HCT 19.9* 24.0* 18.6* 27.5*  MCV 65.0*  --  65.0* 70.5*  PLT 283  --  230 145*   Cardiac Enzymes:  Recent Labs Lab 02/04/14 1404 02/04/14 1810 02/05/14 0115 02/05/14 0655  TROPONINI <0.30 <0.30 0.58* 0.83*   BNP (last 3 results)  Recent Labs  12/03/13 1808 12/05/13 0448  PROBNP 7767.0* 6023.0*   CBG:  Recent Labs Lab 02/04/14 2036 02/04/14 2145 02/05/14 0159 02/05/14 0624 02/05/14 0807  GLUCAP 85 98 115* 149* 148*    No results found for this or any previous visit (from the past 240 hour(s)).   Studies: Ct Abdomen Pelvis Wo Contrast  02/05/2014   CLINICAL DATA:  Acute renal failure.  Anemia.  EXAM: CT ABDOMEN AND PELVIS WITHOUT CONTRAST  TECHNIQUE: Multidetector CT imaging of the abdomen and pelvis was performed following the standard protocol without intravenous contrast.  COMPARISON:  None.  FINDINGS: Trace right-sided pleural fluid is noted. Minimal bibasilar atelectasis is seen. Diffuse coronary artery calcifications are noted.  Evaluation is mildly suboptimal due to motion artifact.  The liver and spleen are unremarkable in appearance. There is haziness about the gallbladder, which could reflect gallbladder wall thickening or could be  artifactual in nature. Would correlate for associated symptoms. The pancreas and adrenal glands are unremarkable.  Nonspecific perinephric stranding is noted bilaterally. The kidneys are otherwise unremarkable in appearance. There is no evidence of hydronephrosis. No renal or ureteral stones are seen.  No free fluid is identified. The small bowel is unremarkable in appearance. The stomach is within normal limits. No acute vascular abnormalities are seen. Diffuse calcification is seen along the abdominal aorta and its branches, including along the superior mesenteric artery.  The appendix is not definitely characterized; there is no evidence for appendicitis. The colon is unremarkable  in appearance.  The bladder is decompressed with a Foley catheter in place. The patient is status post hysterectomy. No suspicious adnexal masses are seen. No inguinal lymphadenopathy is seen.  No acute osseous abnormalities are identified. There is near-complete loss of the joint space at the left hip, with diffuse subcortical cystic change and cortical irregularity. Facet disease is noted at the lower lumbar spine.  IMPRESSION: 1. Haziness about the gallbladder could reflect gallbladder wall thickening, or could be artifactual in nature. Evaluation is mildly suboptimal due to motion artifact. Would correlate for associated symptoms. 2. No evidence of hydronephrosis; kidneys grossly unremarkable in appearance. 3. Diffuse calcification along the abdominal aorta and its branches, including along the superior mesenteric artery. 4. Diffuse coronary artery calcifications seen. 5. Trace right-sided pleural fluid noted. 6. Degenerative change noted at the left hip.   Electronically Signed   By: Garald Balding M.D.   On: 02/05/2014 03:17   Dg Chest 2 View  02/04/2014   CLINICAL DATA:  Altered mental status, history of myocardial infarction and pneumonia  EXAM: CHEST  2 VIEW  COMPARISON:  DG CHEST 1V PORT dated 12/03/2013  FINDINGS: Moderate  enlargement of cardiac silhouette. Vascular pattern is normal. No consolidation or effusion.  IMPRESSION: Moderate enlargement of the cardiac silhouette.  Lungs clear.   Electronically Signed   By: Skipper Cliche M.D.   On: 02/04/2014 12:18   Ct Head Wo Contrast  02/04/2014   CLINICAL DATA:  Altered mental status, weakness  EXAM: CT HEAD WITHOUT CONTRAST  TECHNIQUE: Contiguous axial images were obtained from the base of the skull through the vertex without intravenous contrast.  COMPARISON:  None.  FINDINGS: No evidence of parenchymal hemorrhage or extra-axial fluid collection. No mass lesion, mass effect, or midline shift.  No CT evidence of acute infarction.  Subcortical white matter and periventricular small vessel ischemic changes. Intracranial atherosclerosis.  Age related atrophy.  No ventriculomegaly.  Partial opacification of the right frontal sinus. Visualized paranasal sinuses and mastoid air cells are otherwise clear.  No evidence of calvarial fracture.  IMPRESSION: No evidence of acute intracranial abnormality.  Age related atrophy with small vessel ischemic changes and intracranial atherosclerosis.   Electronically Signed   By: Julian Hy M.D.   On: 02/04/2014 12:44    Scheduled Meds: . amLODipine  10 mg Oral Daily  . atorvastatin  10 mg Oral q1800  . ferrous sulfate  325 mg Oral BID WC  . insulin aspart  0-9 Units Subcutaneous Q4H  . insulin glargine  16 Units Subcutaneous QHS  . loratadine  10 mg Oral Daily  . metoprolol succinate  25 mg Oral Daily  . nitroGLYCERIN  0.5 inch Topical 4 times per day  . sodium chloride  3 mL Intravenous Q12H  . traZODone  25 mg Oral QHS   Continuous Infusions: . 0.9 % NaCl with KCl 20 mEq / L 100 mL/hr at 02/05/14 0417  . pantoprozole (PROTONIX) infusion 8 mg/hr (02/04/14 2048)    Active Problems:   DM (diabetes mellitus), type 2, uncontrolled   GASTROPARESIS, DIABETIC   HYPERLIPIDEMIA   ANEMIA   OBSTRUCTIVE SLEEP APNEA    HYPERTENSION   CEREBRAL EMBOLISM, WITH INFARCTION   GERD   DEGENERATIVE JOINT DISEASE   CAD (coronary artery disease)   GI bleed   ARF (acute renal failure)   Nonischemic cardiomyopathy   Chronic diastolic CHF EF [id number]   Non-ST elevation MI (NSTEMI)   Renal failure, acute   NSTEMI (non-ST elevated  myocardial infarction)    Time spent: 30 min    [facility] Pager [phone]. If 7PM-7AM, please contact night-coverage at [email or web address], password [email or web address] 02/05/2014, 8:33 AM  LOS: 1 day

## 2014-02-05 NOTE — Progress Notes (Signed)
Pt continues to be restless. Pt responding to RN question" [name] how are you doing?" Pt responded "ok" .

## 2014-02-05 NOTE — Evaluation (Signed)
Clinical/Bedside Swallow Evaluation Patient Details  Name: [name] MRN: [id number] Date of Birth: 1933/08/18  Today's Date: 02/05/2014 Time: 1025-8527 SLP Time Calculation (min): 19 min  Past Medical History:  Past Medical History  Diagnosis Date  . Asthma   . CAD (coronary artery disease)     Pt reports MI in 2006 (no documentation).  Cardiolite in 05/2002 and 07/2006 did not reveal any reversible ischemia.  Pt follows with Dr. Rex Kras at [facility].  . CHF (congestive heart failure)     EF 25-30% with dilated LV, mild LVH, severe hypokinesis, and mod-severe reduction in RV function  . Osteoporosis   . HYPERTENSION 08/03/2006  . GASTROPARESIS, DIABETIC 08/03/2006  . HYPERLIPIDEMIA 08/03/2006  . OBSTRUCTIVE SLEEP APNEA 01/06/2008  . PERIPHERAL NEUROPATHY 08/03/2006  . GERD 08/03/2006  . LOW BACK PAIN, CHRONIC 08/03/2006  . OSTEOPOROSIS 03/21/2009  . CEREBRAL EMBOLISM, WITH INFARCTION 07/02/2010  . Angina   . Myocardial infarction "2 or 3"  . Pneumonia 02/26/12    "a few times; probably even today"  . Shortness of breath     "all the time"  . DIABETES MELLITUS, TYPE II 11/04/1983  . Blood transfusion 08/2011  . Lower GI bleeding 08/2011 and 01/2014  . Chronic daily headache   . Migraines   . Stroke summer 2011    "made my left hip worse"  . Uterine cancer   . Nonischemic cardiomyopathy 05/2010    Left heart catheterization:2011. Nonobstructive coronary artery disease.  . Pulmonary hypertension   . Hypertension    Past Surgical History:  Past Surgical History  Procedure Laterality Date  . Esophagogastroduodenoscopy  08/26/2011    Procedure: ESOPHAGOGASTRODUODENOSCOPY (EGD);  Surgeon: [name], MD;  Location: [facility] ENDOSCOPY;  Service: Endoscopy;  Laterality: N/A;  . Colonoscopy  08/28/2011    Procedure: COLONOSCOPY;  Surgeon: [name], MD;  Location: [facility];  Service: Endoscopy;  Laterality: N/A;  . Vaginal hysterectomy    . Tubal ligation    . Cataract  extraction w/ intraocular lens  implant, bilateral    . Toe surgery      "right big toe; operated on it to straighten it out; it was under"  . Polysomnogram  10/17/2005    AHI-7.28/hr. AHI REM-20.8/hr. Average oxygen saturation range during REM and NREM was 97%. Lowest oxygen saturation during REM sleep was 90%.  . Carotid duplex  05/28/2010    No significant extracranial carotid artery stenosis demonstrated. Vertebrals are patent w/ antegrade flow.  . Cardiac catheterization  05/24/2010    No intervention - recommed medical therapy.  . Cardiovascular stress test  08/07/2006    Moderate-severe defect seen in Basal inferior, Mid inferoseptal, Mid inferior, Mid inferolateral, and Apical inferior regions - consistent w/ infarct/scar. No scintigraphic evidence of inducible myocardial ischemia.  . Transthoracic echocardiogram  08/29/2011    EF 55-60%, moderate LVH,    HPI:  [name] is a 80 y.o. female, lives at home with her son with h/o nonischemic cardiomyopathy, chronic combined systolic and diastolic CHF, OSA, hypertension, type 2 diabetes mellitus, diabetic gastroparesis, dyslipidemia, hypertension, CVA in the past, GI bleed in 2012 due to erosive gastritis, who was recently admitted for CHF comes back to the hospital after feeling weak for the last few weeks, few falls at home, at baseline she uses a walker and has early dementia. In the ER she was found to be obtunded, workup was suggestive of dehydration with acute renal failure, CT head chest x-ray was unremarkable.  Assessment / Plan / Recommendation Clinical Impression  Pt's  cognition is currently impacting her ability to orally manipulate PO at this time, due to decreased bolus acceptance and awareness. With minimal PO consumed, she exhibits what appears to be a significant delay in swallow initiation, making her aspiration risk high at this time. Recommend to continue NPO with SLP f/u to assess PO readiness as cognition improves.     Aspiration Risk  Severe    Diet Recommendation NPO   Medication Administration: Via alternative means    Other  Recommendations Oral Care Recommendations: Oral care Q4 per protocol   Follow Up Recommendations  Skilled Nursing facility    Frequency and Duration min 2x/week  2 weeks   Pertinent Vitals/Pain N/A    SLP Swallow Goals     Swallow Study Prior Functional Status       General Date of Onset: 02/04/14 HPI: [name] is a 80 y.o. female, lives at home with her son with h/o nonischemic cardiomyopathy, chronic combined systolic and diastolic CHF, OSA, hypertension, type 2 diabetes mellitus, diabetic gastroparesis, dyslipidemia, hypertension, CVA in the past, GI bleed in 2012 due to erosive gastritis, who was recently admitted for CHF comes back to the hospital after feeling weak for the last few weeks, few falls at home, at baseline she uses a walker and has early dementia. In the ER she was found to be obtunded, workup was suggestive of dehydration with acute renal failure, CT head chest x-ray was unremarkable. Type of Study: Bedside swallow evaluation Previous Swallow Assessment: none in chart Diet Prior to this Study: NPO Temperature Spikes Noted: No Respiratory Status: Room air History of Recent Intubation: No Behavior/Cognition: Doesn't follow directions;Decreased sustained attention Oral Cavity - Dentition:  (unable to view to assess) Self-Feeding Abilities: Total assist Patient Positioning: Upright in bed Baseline Vocal Quality: Other (comment) (unable to assess) Volitional Cough: Cognitively unable to elicit Volitional Swallow: Unable to elicit    Oral/Motor/Sensory Function Overall Oral Motor/Sensory Function: Other (comment) (unable to assess - does not follow commands, does not move oral structures spontaneously)   Ice Chips Ice chips: Not tested   Thin Liquid Thin Liquid: Not tested    Nectar Thick Nectar Thick Liquid: Not tested   Honey Thick Honey  Thick Liquid: Not tested   Puree Puree: Impaired Presentation: Spoon Oral Phase Impairments: Poor awareness of bolus Oral Phase Functional Implications: Prolonged oral transit Pharyngeal Phase Impairments: Suspected delayed Swallow;Decreased hyoid-laryngeal movement   Solid   GO    Solid: Not tested        Germain Osgood, M.A. CCC-SLP [phone]  Germain Osgood 02/05/2014,12:21 PM

## 2014-02-05 NOTE — Progress Notes (Signed)
Lab able to draw labs, MD paged and made aware

## 2014-02-05 NOTE — Progress Notes (Signed)
PT Cancellation Note  Patient Details Name: [name] MRN: [id number] DOB: August 09, 1933   Cancelled Treatment:    Reason Eval/Treat Not Completed: Medical issues which prohibited therapy  Orders received, chart reviewed  Noted troponin trending up  Will need to await until troponin is trending down in order to safely initiate activity  Will continue to follow for appropriateness of PT eval   [facility] 02/05/2014, 9:59 AM  Roney Marion, [place] Pager [phone] Office [phone]

## 2014-02-05 NOTE — Consult Note (Signed)
CARDIOLOGY CONSULT NOTE   Patient ID: [name] MRN: [id number] DOB/AGE: [birth year] 80 y.o.  Admit Date: 02/04/2014 Referring Physician: Edwin Cap MD Primary Physician: [name], MD Consulting Cardiologist: Ottie Glazier MD Primary Cardiologist: Quay Burow MD Reason for Consultation: NSTEMI, CHF  Clinical Summary [name] is a 80 y.o.female with known history of minimal CAD per 05/24/2010 cardiac cath, EF of 20% at that time, NICM, hypertension, GIB due to gastritis, and hyperlipidemia admitted metabolic encephalopathy,with symptoms of increased confusion and decreased responsiveness and anemia requiring transfusion. Hgb 5.6. She was also found to be in renal failure with creatinine of 4.00 with subsequent level of 4.25 this am. She has had a mild troponin bump of 0.83 this am, with troponin of 0.58 and <0.30 from admission.      History includes that the patient, on interview a family members at bedside as patient is confused, but the patient fell 2 days prior to admission, and also had incontinent bowel movement. She lives with her son, who cares for her. Daughters come to take care of her as well. He states she has been eating and drinking regularly. Her daughter saw her the evening before help her bathe and wash her hair and made her a meal. Helped her to go to bed. The following morning she was unresponsive.   There was a discussion via phone with cardiology per notes, with no recommendations for further invasive testing. We are asked for further recommendations. She has since been given 2 units of PRBC's and IV rehydration. She continues confusion.        Allergies  Allergen Reactions  . Baking Soda-Fluoride [Sodium Fluoride] Nausea And Vomiting  . Magnesium Hydroxide Nausea And Vomiting    Medications Scheduled Medications: . amLODipine  10 mg Oral Daily  . atorvastatin  10 mg Oral q1800  . ferrous sulfate  325 mg Oral BID WC  . insulin aspart  0-9 Units  Subcutaneous Q4H  . insulin glargine  16 Units Subcutaneous QHS  . loratadine  10 mg Oral Daily  . metoprolol succinate  25 mg Oral Daily  . nitroGLYCERIN  0.5 inch Topical 4 times per day  . sodium chloride  3 mL Intravenous Q12H  . traZODone  25 mg Oral QHS    Infusions: . pantoprozole (PROTONIX) infusion 8 mg/hr (02/04/14 2048)  .  sodium bicarbonate 150 mEq in sterile water 1000 mL infusion 75 mL/hr at 02/05/14 1004    PRN Medications: alum & mag hydroxide-simeth, guaiFENesin-dextromethorphan, hydrALAZINE, metoprolol, nitroGLYCERIN, ondansetron (ZOFRAN) IV, ondansetron, polyethylene glycol  Past Medical History  Diagnosis Date  . Asthma   . CAD (coronary artery disease)     Pt reports MI in 2006 (no documentation).  Cardiolite in 05/2002 and 07/2006 did not reveal any reversible ischemia.  Pt follows with Dr. Rex Kras at [facility].  . CHF (congestive heart failure)     EF 25-30% with dilated LV, mild LVH, severe hypokinesis, and mod-severe reduction in RV function  . Osteoporosis   . HYPERTENSION 08/03/2006  . GASTROPARESIS, DIABETIC 08/03/2006  . HYPERLIPIDEMIA 08/03/2006  . OBSTRUCTIVE SLEEP APNEA 01/06/2008  . PERIPHERAL NEUROPATHY 08/03/2006  . GERD 08/03/2006  . LOW BACK PAIN, CHRONIC 08/03/2006  . OSTEOPOROSIS 03/21/2009  . CEREBRAL EMBOLISM, WITH INFARCTION 07/02/2010  . Angina   . Myocardial infarction "2 or 3"  . Pneumonia 02/26/12    "a few times; probably even today"  . Shortness of breath     "all the time"  .  DIABETES MELLITUS, TYPE II 11/04/1983  . Blood transfusion 08/2011  . Lower GI bleeding 08/2011  . Chronic daily headache   . Migraines   . Stroke summer 2011    "made my left hip worse"  . Uterine cancer   . Nonischemic cardiomyopathy   . Pulmonary hypertension   . Hypertension     Past Surgical History  Procedure Laterality Date  . Esophagogastroduodenoscopy  08/26/2011    Procedure: ESOPHAGOGASTRODUODENOSCOPY (EGD);  Surgeon: [name], MD;   Location: [facility];  Service: Endoscopy;  Laterality: N/A;  . Colonoscopy  08/28/2011    Procedure: COLONOSCOPY;  Surgeon: [name], MD;  Location: [facility];  Service: Endoscopy;  Laterality: N/A;  . Vaginal hysterectomy    . Tubal ligation    . Cataract extraction w/ intraocular lens  implant, bilateral    . Toe surgery      "right big toe; operated on it to straighten it out; it was under"  . Polysomnogram  10/17/2005    AHI-7.28/hr. AHI REM-20.8/hr. Average oxygen saturation range during REM and NREM was 97%. Lowest oxygen saturation during REM sleep was 90%.  . Carotid duplex  05/28/2010    No significant extracranial carotid artery stenosis demonstrated. Vertebrals are patent w/ antegrade flow.  . Cardiac catheterization  05/24/2010    No intervention - recommed medical therapy.  . Cardiovascular stress test  08/07/2006    Moderate-severe defect seen in Basal inferior, Mid inferoseptal, Mid inferior, Mid inferolateral, and Apical inferior regions - consistent w/ infarct/scar. No scintigraphic evidence of inducible myocardial ischemia.  . Transthoracic echocardiogram  08/29/2011    EF 55-60%, moderate LVH,     Family History  Problem Relation Age of Onset  . Diabetes insipidus Mother   . Hypertension Mother   . Hypertension Father   . Hypertension Sister   . Hypertension Child     Social History [name] reports that she has never smoked. She quit smokeless tobacco use about 7 years ago. Her smokeless tobacco use included Snuff. [name] reports that she does not drink alcohol.  Review of Systems Otherwise reviewed and negative except as outlined.  Physical Examination Blood pressure 159/62, pulse 80, temperature 97.9 F (36.6 C), temperature source Axillary, resp. rate 22, weight 145 lb 8.1 oz (66 kg), last menstrual period 12/19/1968, SpO2 98.00%.  Intake/Output Summary (Last 24 hours) at 02/05/14 1021 Last data filed at 02/05/14 0600  Gross per 24 hour    Intake    475 ml  Output    750 ml  Net   -275 ml    Telemetry: NSR with PVC's.  KZS:[id number], uncooperative.  HEENT: Conjunctiva and lids normal, oropharynx clear with moist mucosa. Neck: Supple, no elevated JVP or carotid bruits, no thyromegaly. Lungs: Clear to auscultation, anteriorly, nonlabored breathing at rest.. Cardiac: Regular rate and rhythm, occasional irregular beats, no S3 or significant systolic murmur, no pericardial rub. Abdomen: Soft, nontender, no hepatomegaly, bowel sounds present, no guarding or rebound. Extremities: No pitting edema, distal pulses 2+. Skin: Warm and dry. Musculoskeletal: No kyphosis. Neuropsychiatric:Confused, non-cooperative.  Prior Cardiac Testing/Procedures 1. Echocardiogram 12/28/2013 Left ventricle: Systolic function was mildly to moderately reduced. The estimated ejection fraction was in the range of 40% to 45%. Features are consistent with a pseudonormal left ventricular filling pattern, with concomitant abnormal relaxation and increased filling pressure (grade 2 diastolic dysfunction). - Aortic valve: Trivial regurgitation. - Mitral valve: Moderate regurgitation directed centrally.  2. Cardiac Cath 8/ 2011 Left heart catheterization:  1. Left main - patent. 2. LAD - 40% to 50% mid LAD stenosis. 3. OM-2 - ostial 60% stenosis (small branch). 4. RCA - mild luminal irregularities, anterior takeoff - codominant. 5. LVEDP 12 mmHg. 6. LV gram - global hypokinesis, LVEF 20% to 30%, 2+ to 3+ MR. 7. Aortogram - no renal stenosis.  Right heart catheterization:  8. RA - 5. 9. RV - 66/3. 10.PA - 64/19 (38). 11.PCWP - 22. 12.TPG - 16. 13.PA sat pecent - 58%. 14.Aortic sat percent - 96%. 15.Fick cardiac output - 3.98. 16.Fick cardiac index - 1.98. 17.Thermodilution cardiac output - 3.58. 18.Thermodilution cardiac index - 1.98.  IMPRESSION: 1. Nonobstructive coronary artery disease. 2. Systolic heart failure, decompensated, with  global hypokinesis and     ejection fraction of 20% to 30%. 3. Moderate-to-severe mitral regurgitation. 4. Low normal cardiac output/index. 5. Mild pulmonary arterial hypertension.  Lab Results  Basic Metabolic Panel:  Recent Labs Lab 02/04/14 1209 02/04/14 1552 02/05/14 0115 02/05/14 0655  NA 136*  --  137 133*  K 3.5*  --  4.1 4.8  CL 101  --  101 99  CO2  --   --  13* 12*  GLUCOSE 97  --  111* 135*  BUN 67*  --  71* 74*  CREATININE 4.20* 4.00* 4.20* 4.25*  CALCIUM  --   --  8.5 8.6    Liver Function Tests:  Recent Labs Lab 02/05/14 0655  AST 97*  ALT 53*  ALKPHOS 93  BILITOT 1.2  PROT 6.8  ALBUMIN 3.0*    CBC:  Recent Labs Lab 02/04/14 1158 02/04/14 1209 02/04/14 1810 02/05/14 0655  WBC 9.1  --  7.6 9.2  NEUTROABS 6.3  --   --   --   HGB 6.2* 8.2* 5.6* 9.1*  HCT 19.9* 24.0* 18.6* 27.5*  MCV 65.0*  --  65.0* 70.5*  PLT 283  --  230 145*    Cardiac Enzymes:  Recent Labs Lab 02/04/14 1404 02/04/14 1810 02/05/14 0115 02/05/14 0655  TROPONINI <0.30 <0.30 0.58* 0.83*    Radiology: Ct Abdomen Pelvis Wo Contrast  02/05/2014   CLINICAL DATA:  Acute renal failure.  Anemia.  EXAM: CT ABDOMEN AND PELVIS WITHOUT CONTRAST  TECHNIQUE:  IMPRESSION: 1. Haziness about the gallbladder could reflect gallbladder wall thickening, or could be artifactual in nature. Evaluation is mildly suboptimal due to motion artifact. Would correlate for associated symptoms. 2. No evidence of hydronephrosis; kidneys grossly unremarkable in appearance. 3. Diffuse calcification along the abdominal aorta and its branches, including along the superior mesenteric artery. 4. Diffuse coronary artery calcifications seen. 5. Trace right-sided pleural fluid noted. 6. Degenerative change noted at the left hip.   Electronically Signed   By: Garald Balding M.D.   On: 02/05/2014 03:17   Dg Chest 2 View  02/04/2014   CLINICAL DATA:  Altered mental status, history of myocardial infarction and  pneumonia  EXAM: CHEST  2 VIEW  COMPARISON:  DG CHEST 1V PORT dated 12/03/2013  FINDINGS: Moderate enlargement of cardiac silhouette. Vascular pattern is normal. No consolidation or effusion.  IMPRESSION: Moderate enlargement of the cardiac silhouette.  Lungs clear.   Electronically Signed   By: Skipper Cliche M.D.   On: 02/04/2014 12:18   Ct Head Wo Contrast  02/04/2014   CLINICAL DATA:  Altered mental status, weakness  EXAM: CT HEAD WITHOUT CONTRAST  TECHNIQUE: .  IMPRESSION: No evidence of acute intracranial abnormality.  Age related atrophy with small vessel ischemic changes and  intracranial atherosclerosis.   Electronically Signed   By: Julian Hy M.D.   On: 02/04/2014 12:44   ECG: Atrial fibrillation Multiple ventricular premature complexes Borderline repolarization abnormality   Impression and Recommendations  1. NSTEMI Type ML:[id number], with demand ischemia in the setting of anemia  and chronic kidney disease, along with reduced ejection fraction of 45% per most recent echo. There is no evidence of CHF on exam. She is confused and is not on PO medications at this time. She is on  NTG paste. Will not plan repeat echo  as she just had had in March of this year and is unlikely to change treatment regimen.She has multiple co-morbidities and would not pursue ischemic work up. Restart BB when able to take in po.   2. CAD: Most recent cardiac cath in 2011 with non-obstructive CAD. She has systolic dysfunction per echo which is improved to 40-45%  from cardiac cath measurements of 20%-30%. Continue BB. No ASA due to anemia with hemoccult positive stools. No ACE due to renal failure.    3. Atrial fibrillation: Found on admission EKG. NSR rhythm now. Likely from anemia. Not an anticoagulant candidate.  4. Acute Metabolic Encephalopathy: Significant confusion, with acute renal failure. IV fluid hydration is provided.  5. Anemia: Hemoccult positive stools on admission with Hgb 5.6.  Received blood transfusion 2 units. On protonix infusion. Now 9.1.   6. Acute Renal failure:Creatinine at baseline 1.09 but has been trending upwards since Feb of 2015 with significant jump from 1.85 to 4.20 on admission. She is not planned for hemodialysis.   7. Hypertension: Blood pressure is increasing with fluid hydration, and blood transfusion. She is on metoprolol and amlodipine currently, but is to taking while here due to confusion and lack of cooperation with nurses. Getting prn hydralazine for BP elevation.   8. Recent Fall: Likely due to anemia and dehydration.   Signed: Phill Myron. Purcell Nails NP [facility] 02/05/2014, 10:21 AM Co-Sign MD  The patient was seen, examined and discussed with Jory Sims, NP and agree as above.  A 80 year old female with h/o non-ischemic CMP with LVEF 40-45%, who was admitted with acute encephalopathy, severe anemia, acute on chronic kidney failure and NSTEMI. Cath in 2011 showed non-obstructive CAD.  Tropnin elevation is on demand ischemia that can be attributed to severe anemia, hypotension and acute kidney failure. I agree with transfusion, ideally the Hb should be > 10 g/dL. There are no signs of CHF but we will follow as she is being transfused. The family doesn't wish aggressive management and in the past medical management was chosen. Therefore we don't recommend further ischemia work up unless the clinical picture changes. We will continue cycling Troponin until it peaks. I recommend to start iv Lopressor as the patient is refusing PO intake and switch to PO lopressor, add aspirin and statin once able to eat. Hold ACEI in the settings of AKI. We will follow.  Dorothy Spark 02/05/2014

## 2014-02-05 NOTE — Progress Notes (Addendum)
Hypoglycemic Event  CBG: 57  Treatment: 25mg  d50  Symptoms: lethargic  Follow-up CBG: Time: CBG Result: 113  Possible Reasons for Event: npo status  Comments/MD notified:    Tresa Endo  Remember to initiate Hypoglycemia Order Set & complete

## 2014-02-05 NOTE — Clinical Social Work Psychosocial (Signed)
Clinical Social Work Department BRIEF PSYCHOSOCIAL ASSESSMENT 02/05/2014  Patient:  [name], [name]     Account Number:  [id number]     Admit date:  02/04/2014  Clinical Social Worker:  Hubert Azure  Date/Time:  02/05/2014 03:08 PM  Referred by:  Physician  Date Referred:  02/05/2014 Referred for  SNF Placement   Other Referral:   Interview type:  Family Other interview type:   CSW spoke with patient's daughter [name], who was present at bedside as patient was pleasantly disoriented.    PSYCHOSOCIAL DATA Living Status:  FAMILY Admitted from facility:   Level of care:   Primary support name:  [name] Primary support relationship to patient:  CHILD, ADULT Degree of support available:   Good. Patient resides with son [name].    CURRENT CONCERNS Current Concerns  Post-Acute Placement   Other Concerns:    SOCIAL WORK ASSESSMENT / PLAN CSW met with patient's daughter [name] who was present at bedside. CSW introduced self and explained role. CSW further discussed d/c plan with daughter. Per daughter, patient was residing with son [name]. Daughter reported patient was ambulating with a walker, prior to hospitalization. Patient's daughter stated she could not discuss d/c plan with CSW as patient is under the care of her son [name]. CSW verbalized understanding of the above and attempted unsuccessfully to contact [name] [phone]).   Assessment/plan status:  Information/Referral to [employer] Other assessment/ plan:   Will complete FL2 for SNF placement.   Information/referral to community resources:   CSW provided patient's daughter with community SNF list.    PATIENT'S/FAMILY'S RESPONSE TO PLAN OF CARE: Patient's daughter thanked CSW for assistance with d/c plan.   [place], [place] Weekend Clinical Social Worker [phone]

## 2014-02-05 NOTE — Clinical Social Work Placement (Signed)
Clinical Social Work Department CLINICAL SOCIAL WORK PLACEMENT NOTE 02/05/2014  Patient:  [name], [name]  Account Number:  [id number] Admit date:  02/04/2014  Clinical Social Worker:  Carrington Clamp, LCSWA  Date/time:  02/05/2014 03:15 PM  Clinical Social Work is seeking post-discharge placement for this patient at the following level of care:   [place]   (*CSW will update this form in Epic as items are completed)   02/05/2014  Patient/family provided with [place] Department of Clinical Social Work's list of facilities offering this level of care within the geographic area requested by the patient (or if unable, by the patient's family).  02/05/2014  Patient/family informed of their freedom to choose among providers that offer the needed level of care, that participate in Medicare, Medicaid or managed care program needed by the patient, have an available bed and are willing to accept the patient.  02/05/2014  Patient/family informed of [facility]' ownership interest in [facility], as well as of the fact that they are under no obligation to receive care at this facility.  PASARR submitted to EDS on 02/05/2014 PASARR number received from EDS on 02/05/2014  FL2 transmitted to all facilities in geographic area requested by pt/family on  02/05/2014 FL2 transmitted to all facilities within larger geographic area on   Patient informed that his/her managed care company has contracts with or will negotiate with  certain facilities, including the following:     Patient/family informed of bed offers received:   Patient chooses bed at  Physician recommends and patient chooses bed at    Patient to be transferred to  on   Patient to be transferred to facility by   The following physician request were entered in Epic:   Additional Comments:  [place], [place] Weekend Clinical Social Worker [phone]

## 2014-02-05 NOTE — Consult Note (Signed)
Renal Service Consult Note [facility]  [name] 02/05/2014 Sol Blazing Requesting Physician:  Dr Sheran Fava  Reason for Consult:  Acute on CKD3 HPI: The patient is a 80 y.o. year-old with hx of CAD, DM2, HTN, NICM EF 25%, gastroparesis, HL, OSA, neuropathy, chronic LBP, hx CVA who presented to ED from home with AMS.  Pt fell 2d prior.  Weakness in the legs.  Some difficulty breathing. [place] multiple times at home.  Pt usually ambulatory. Was found to have renal failure with creat 4, anemia w Hb 5, dehydration and NSTEMI.  Was admitted to r/o GIB. Transfused, held sedating meds. UA and CXR were negative, head CT unremarkable. Not a good candidate for invasive procedure per cardiology, medical management due to comorbidities. ECHO showed EF 45%  Patient is agitated and not responding. Family gives hx says that patient was "fine" up until this.  Some hx of memory problems and not taking meds, poor DM control in the chart.     Chart Review 2006 >> PNA, acute on CRF, asthma, anemia, DM poor control 2007 >> atypical CP, HTN, CHF, DM2, CKD, OSA  2009 >> chest pain, stable angina. Abd pain, gastroenteritis, HTN, CHF< DM2, OSA, anemia July 2011 >> SBO, CHF exacerbation > diuresed, EF 20-25% Aug 2011 >> acute on chronic CHF, heart cath showed 50% LAD lesion, no other sig disease. EF down 25%, +MR. AMS after cath with small acute CVA's by MRI (frontal and occipital). Started on coumadin for possible embolic strokes in setting of severe LV dysfunction Sept 2011 >> returned confused, low BP's, abd pain, AKI w Creat 4.8.  AKI due to contrast, prerenal.  Cr peaked 5.6 and improved to 2.1 at discharge.  UTI w Ecoli.  DM2 June 2012 >> Chest pain, CE's were negative, had cath in 2011 w/o sig CAD. Treated medically. CKD creat 1.3-1.18 Aug 2011 >> gen weakness, dark stool, Hb 4.9 , INR 3.3 > EGD showed erosive gastritis and antral ulcers. Repeat echo showed normal EF so then coumadin was stopped  altogether May 2013 >> acute bronchitis, Vent bigeminy, AKI, DM poorly controlled, hx CHF, HTN; lisinopril stopped, Hb A1c 25 Nov 2013 >> SOB x 2 wks.  Pt was living w son and granddaughter, quite forgetful and inconsistently taking medications. Had moderate CHF and BS 500's.  Diuresed. Creat 1.4-1.8.  Uncontrolled DM, HTN. Memory difficulties.   Date  Creat  eGFR  Stage 2007  1.0  52  III 2008  0.9-1.1  46-60  III 2009-2010 0.8-1.2  41-60  III 2011  1.01 - 5.56 9-52   2012  1.00 - 1.82 26-52  III / IV 2013  1.10 - 1.28 40-48  III Feb 2015 1.09 - 1.85 25-48  III / IV Feb 04 2014 4.20  9  Feb 05 2014 4.25  9   Past Medical History  Past Medical History  Diagnosis Date  . Asthma   . CAD (coronary artery disease)     Pt reports MI in 2006 (no documentation).  Cardiolite in 05/2002 and 07/2006 did not reveal any reversible ischemia.  Pt follows with Dr. Rex Kras at [facility].  . CHF (congestive heart failure)     EF 25-30% with dilated LV, mild LVH, severe hypokinesis, and mod-severe reduction in RV function  . Osteoporosis   . HYPERTENSION 08/03/2006  . GASTROPARESIS, DIABETIC 08/03/2006  . HYPERLIPIDEMIA 08/03/2006  . OBSTRUCTIVE SLEEP APNEA 01/06/2008  . PERIPHERAL NEUROPATHY 08/03/2006  . GERD 08/03/2006  .  LOW BACK PAIN, CHRONIC 08/03/2006  . OSTEOPOROSIS 03/21/2009  . CEREBRAL EMBOLISM, WITH INFARCTION 07/02/2010  . Angina   . Myocardial infarction "2 or 3"  . Pneumonia 02/26/12    "a few times; probably even today"  . Shortness of breath     "all the time"  . DIABETES MELLITUS, TYPE II 11/04/1983  . Blood transfusion 08/2011  . Lower GI bleeding 08/2011 and 01/2014  . Chronic daily headache   . Migraines   . Stroke summer 2011    "made my left hip worse"  . Uterine cancer   . Nonischemic cardiomyopathy 05/2010    Left heart catheterization:2011. Nonobstructive coronary artery disease.  . Pulmonary hypertension   . Hypertension    Past Surgical History  Past Surgical History   Procedure Laterality Date  . Esophagogastroduodenoscopy  08/26/2011    Procedure: ESOPHAGOGASTRODUODENOSCOPY (EGD);  Surgeon: [name], MD;  Location: [facility] ENDOSCOPY;  Service: Endoscopy;  Laterality: N/A;  . Colonoscopy  08/28/2011    Procedure: COLONOSCOPY;  Surgeon: [name], MD;  Location: [place];  Service: Endoscopy;  Laterality: N/A;  . Vaginal hysterectomy    . Tubal ligation    . Cataract extraction w/ intraocular lens  implant, bilateral    . Toe surgery      "right big toe; operated on it to straighten it out; it was under"  . Polysomnogram  10/17/2005    AHI-7.28/hr. AHI REM-20.8/hr. Average oxygen saturation range during REM and NREM was 97%. Lowest oxygen saturation during REM sleep was 90%.  . Carotid duplex  05/28/2010    No significant extracranial carotid artery stenosis demonstrated. Vertebrals are patent w/ antegrade flow.  . Cardiac catheterization  05/24/2010    No intervention - recommed medical therapy.  . Cardiovascular stress test  08/07/2006    Moderate-severe defect seen in Basal inferior, Mid inferoseptal, Mid inferior, Mid inferolateral, and Apical inferior regions - consistent w/ infarct/scar. No scintigraphic evidence of inducible myocardial ischemia.  . Transthoracic echocardiogram  08/29/2011    EF 55-60%, moderate LVH,    Family History  Family History  Problem Relation Age of Onset  . Diabetes insipidus Mother   . Hypertension Mother   . Hypertension Father   . Hypertension Sister   . Hypertension Child    Social History  reports that she has never smoked. She quit smokeless tobacco use about 7 years ago. Her smokeless tobacco use included Snuff. She reports that she does not drink alcohol or use illicit drugs. Allergies  Allergies  Allergen Reactions  . Baking Soda-Fluoride [Sodium Fluoride] Nausea And Vomiting  . Magnesium Hydroxide Nausea And Vomiting   Home medications Prior to Admission medications   Medication Sig Start  Date End Date Taking? Authorizing Provider  acetaminophen (TYLENOL) 500 MG tablet Take 500 mg by mouth every 6 (six) hours as needed for moderate pain or headache.   Yes Historical Provider, MD  albuterol (PROAIR HFA) 108 (90 BASE) MCG/ACT inhaler Inhale 2 puffs into the lungs every 6 (six) hours as needed for wheezing. 10/12/12  Yes Karren Cobble, MD  amLODipine (NORVASC) 10 MG tablet Take 1 tablet (10 mg total) by mouth daily. 02/07/13  Yes Karren Cobble, MD  aspirin 81 MG EC tablet Take 81 mg by mouth daily.     Yes Historical Provider, MD  insulin aspart (NOVOLOG FLEXPEN) 100 UNIT/ML FlexPen Inject 7 Units into the skin daily with breakfast.    Yes Historical Provider, MD  insulin glargine (  LANTUS) 100 UNIT/ML injection Inject 4-34 Units into the skin 2 (two) times daily. Inject 4 units at dinner and 34 units at bedtime 12/12/11 12/11/14 Yes Nils Pyle, MD  isosorbide mononitrate (IMDUR) 60 MG 24 hr tablet Take 1 tablet (60 mg total) by mouth daily. 02/07/13  Yes Karren Cobble, MD  lisinopril (PRINIVIL,ZESTRIL) 5 MG tablet Take 5 mg by mouth daily.   Yes Historical Provider, MD  loratadine (ALLERGY RELIEF) 10 MG tablet Take 10 mg by mouth daily.   Yes Historical Provider, MD  metoprolol succinate (TOPROL-XL) 25 MG 24 hr tablet Take 1 tablet (25 mg total) by mouth daily. 12/05/13 12/05/14 Yes Annita Brod, MD  Multiple Vitamins-Minerals (ALIVE WOMENS 50+ PO) Take 1 tablet by mouth daily.   Yes Historical Provider, MD  nitroGLYCERIN (NITROSTAT) 0.4 MG SL tablet Place 1 tablet (0.4 mg total) under the tongue every 5 (five) minutes as needed. For chest pain. 03/15/12  Yes Nils Pyle, MD  polyethylene glycol [facility] / GLYCOLAX) packet Take 17 g by mouth daily.   Yes Historical Provider, MD  potassium chloride (K-DUR) 10 MEQ tablet Take 10 mEq by mouth daily.   Yes Historical Provider, MD  rosuvastatin (CRESTOR) 20 MG tablet Take 1 tablet (20 mg total) by mouth daily. 02/07/13  Yes  Karren Cobble, MD  torsemide (DEMADEX) 10 MG tablet Take 10 mg by mouth daily.   Yes Historical Provider, MD  BD INSULIN SYRINGE ULTRAFINE 31G X 5/16" 0.3 ML MISC  11/19/13   Historical Provider, MD   Liver Function Tests  Recent Labs Lab 02/05/14 0655  AST 97*  ALT 53*  ALKPHOS 93  BILITOT 1.2  PROT 6.8  ALBUMIN 3.0*   No results found for this basename: LIPASE, AMYLASE,  in the last 168 hours CBC  Recent Labs Lab 02/04/14 1158 02/04/14 1209 02/04/14 1810 02/05/14 0655  WBC 9.1  --  7.6 9.2  NEUTROABS 6.3  --   --   --   HGB 6.2* 8.2* 5.6* 9.1*  HCT 19.9* 24.0* 18.6* 27.5*  MCV 65.0*  --  65.0* 70.5*  PLT 283  --  230 923*   Basic Metabolic Panel  Recent Labs Lab 02/04/14 1209 02/04/14 1552 02/05/14 0115 02/05/14 0655  NA 136*  --  137 133*  K 3.5*  --  4.1 4.8  CL 101  --  101 99  CO2  --   --  13* 12*  GLUCOSE 97  --  111* 135*  BUN 67*  --  71* 74*  CREATININE 4.20* 4.00* 4.20* 4.25*  CALCIUM  --   --  8.5 8.6    Filed Vitals:   02/05/14 0410 02/05/14 0513 02/05/14 0530 02/05/14 1211  BP: 150/46 154/83 159/62 155/60  Pulse: 65 78 80 59  Temp: 98.4 F (36.9 C) 98 F (36.7 C) 97.9 F (36.6 C)   TempSrc: Axillary Axillary Axillary   Resp: _0 Weight:   66 kg (145 lb 8.1 oz)   SpO2: 98% 99% 98% 99%   Exam Elderly AAM, agitated , rolling around in bed, confused, moans but not verbalizing No rash, cyanosis or gangrene Sclera anicteric, throat clear No jvd Chest clear bilat RRR no MRG Abd soft, mild obese, NTND, no mass or HSM Ext no LE or UE edema, no jt effusions or ulceration Neuro not oriented, delirious, agitated mildly, not speaking, moans from time to time, nonfocal grossly  UA >> 100 prot, 0-2  rbc and wbc Last renal US in 2011 >> 11-12 cm kidneys, increased echogenicity CXR >> CM, no edema Last echo Mar 2015 >> LV EF 40-45% (was 25% on older studies)  Assessment: 1 Acute on CRF- suspect vol depletion / ACEI / diuretics as  cause; UA benign, kidneys normal on CT scan no hydro 2 CKD baseline creat 1.2 - 1.7 (CKD 3)- creat at baseline in Feb 2015 3 Altered mental status- delirium, agitation; could be uremia contributing, r/o sepsis 4 DM2 longstanding, poor control 5 HTN, longstanding 6 Hx CVA-  Treated w coumadin for embolic stroke / low EF, but had GIB and echo showed normal EF so coumadin stopped 7 Metabolic acidosis   Plan- Holding ACEI.  Will increase IVF's to 125 cc/hr, making urine, has foley in place. Hopefully will recover function.  I am reluctant to recommend dialysis given pt's age and frailty- I am concerned that she might not be strong enough for RRT and I spoke with the daughter's about this and they responded positively about this assessment and approach.  Kelly Splinter MD (pgr) [phone]    ([phone] 02/05/2014, 2:17 PM

## 2014-02-05 NOTE — Progress Notes (Signed)
Patient agitated and striking staff. Unable to obtain qhs CBG or vital signs. Will attempt again when patient calms down. Will continue to monitor. Tresa Endo

## 2014-02-05 NOTE — Progress Notes (Signed)
Utilization review completed.  

## 2014-02-06 ENCOUNTER — Inpatient Hospital Stay (HOSPITAL_COMMUNITY): Payer: Medicare Other

## 2014-02-06 DIAGNOSIS — K922 Gastrointestinal hemorrhage, unspecified: Secondary | ICD-10-CM

## 2014-02-06 DIAGNOSIS — G9340 Encephalopathy, unspecified: Secondary | ICD-10-CM

## 2014-02-06 DIAGNOSIS — I428 Other cardiomyopathies: Secondary | ICD-10-CM

## 2014-02-06 LAB — COMPREHENSIVE METABOLIC PANEL
ALK PHOS: 95 U/L (ref 39–117)
ALT: 89 U/L — ABNORMAL HIGH (ref 0–35)
AST: 126 U/L — ABNORMAL HIGH (ref 0–37)
Albumin: 2.7 g/dL — ABNORMAL LOW (ref 3.5–5.2)
BUN: 76 mg/dL — AB (ref 6–23)
CO2: 24 mEq/L (ref 19–32)
Calcium: 8.2 mg/dL — ABNORMAL LOW (ref 8.4–10.5)
Chloride: 98 mEq/L (ref 96–112)
Creatinine, Ser: 4.67 mg/dL — ABNORMAL HIGH (ref 0.50–1.10)
GFR, EST AFRICAN AMERICAN: 9 mL/min — AB (ref >90)
GFR, EST NON AFRICAN AMERICAN: 8 mL/min — AB (ref >90)
GLUCOSE: 62 mg/dL — AB (ref 70–99)
POTASSIUM: 3.5 meq/L — AB (ref 3.7–5.3)
SODIUM: 137 meq/L (ref 137–147)
Total Bilirubin: 1.1 mg/dL (ref 0.3–1.2)
Total Protein: 6 g/dL (ref 6.0–8.3)

## 2014-02-06 LAB — CBC
HEMATOCRIT: 25 % — AB (ref 36.0–46.0)
Hemoglobin: 8.3 g/dL — ABNORMAL LOW (ref 12.0–15.0)
MCH: 23.5 pg — ABNORMAL LOW (ref 26.0–34.0)
MCHC: 33.2 g/dL (ref 30.0–36.0)
MCV: 70.8 fL — ABNORMAL LOW (ref 78.0–100.0)
Platelets: 198 10*3/uL (ref 150–400)
RBC: 3.53 MIL/uL — ABNORMAL LOW (ref 3.87–5.11)
RDW: 21.2 % — AB (ref 11.5–15.5)
WBC: 8.9 10*3/uL (ref 4.0–10.5)

## 2014-02-06 LAB — GLUCOSE, CAPILLARY
GLUCOSE-CAPILLARY: 113 mg/dL — AB (ref 70–99)
GLUCOSE-CAPILLARY: 120 mg/dL — AB (ref 70–99)
GLUCOSE-CAPILLARY: 57 mg/dL — AB (ref 70–99)
Glucose-Capillary: 65 mg/dL — ABNORMAL LOW (ref 70–99)
Glucose-Capillary: 141 mg/dL — ABNORMAL HIGH (ref 70–99)
Glucose-Capillary: 101 mg/dL — ABNORMAL HIGH (ref 70–99)
Glucose-Capillary: 160 mg/dL — ABNORMAL HIGH (ref 70–99)
Glucose-Capillary: 199 mg/dL — ABNORMAL HIGH (ref 70–99)

## 2014-02-06 LAB — TYPE AND SCREEN
ABO/RH(D): B POS
ANTIBODY SCREEN: NEGATIVE
UNIT DIVISION: 0
Unit division: 0

## 2014-02-06 LAB — IRON AND TIBC
Iron: 14 ug/dL — ABNORMAL LOW (ref 42–135)
Saturation Ratios: 3 % — ABNORMAL LOW (ref 20–55)
TIBC: 449 ug/dL (ref 250–470)
UIBC: 435 ug/dL — AB (ref 125–400)

## 2014-02-06 LAB — TROPONIN I: Troponin I: 1.55 ng/mL (ref <0.30)

## 2014-02-06 MED ORDER — POTASSIUM CHLORIDE 10 MEQ/100ML IV SOLN
10.0000 meq | INTRAVENOUS | Status: AC
  Administered 2014-02-06 (×2): 10 meq via INTRAVENOUS
  Filled 2014-02-06 (×3): qty 100

## 2014-02-06 MED ORDER — AMLODIPINE BESYLATE 10 MG PO TABS
10.0000 mg | ORAL_TABLET | Freq: Every day | ORAL | Status: DC
  Administered 2014-02-06 – 2014-02-08 (×3): 10 mg via ORAL
  Filled 2014-02-06 (×4): qty 1

## 2014-02-06 MED ORDER — DEXTROSE 50 % IV SOLN
INTRAVENOUS | Status: AC
  Administered 2014-02-06: 50 mL
  Filled 2014-02-06: qty 50

## 2014-02-06 MED ORDER — ADULT MULTIVITAMIN W/MINERALS CH
1.0000 | ORAL_TABLET | Freq: Every day | ORAL | Status: DC
  Administered 2014-02-06 – 2014-02-10 (×5): 1 via ORAL
  Filled 2014-02-06 (×5): qty 1

## 2014-02-06 MED ORDER — METOPROLOL TARTRATE 12.5 MG HALF TABLET
12.5000 mg | ORAL_TABLET | Freq: Two times a day (BID) | ORAL | Status: DC
  Filled 2014-02-06 (×2): qty 1

## 2014-02-06 MED ORDER — METOPROLOL SUCCINATE 12.5 MG HALF TABLET: 12.5000 mg | ORAL_TABLET | Freq: Every day | ORAL | Status: DC

## 2014-02-06 MED ORDER — PANTOPRAZOLE SODIUM 40 MG IV SOLR
40.0000 mg | Freq: Two times a day (BID) | INTRAVENOUS | Status: DC
  Administered 2014-02-06: 40 mg via INTRAVENOUS
  Filled 2014-02-06 (×3): qty 40

## 2014-02-06 MED ORDER — ATORVASTATIN CALCIUM 40 MG PO TABS
40.0000 mg | ORAL_TABLET | Freq: Every day | ORAL | Status: DC
  Administered 2014-02-06 – 2014-02-09 (×4): 40 mg via ORAL
  Filled 2014-02-06 (×5): qty 1

## 2014-02-06 MED ORDER — GLUCERNA SHAKE PO LIQD: 237.0000 mL | Freq: Two times a day (BID) | ORAL | Status: DC | PRN

## 2014-02-06 MED ORDER — DEXTROSE-NACL 5-0.9 % IV SOLN
INTRAVENOUS | Status: DC
  Administered 2014-02-06: 10 mL/h via INTRAVENOUS
  Administered 2014-02-06: 75 mL/h via INTRAVENOUS

## 2014-02-06 MED ORDER — CARVEDILOL 3.125 MG PO TABS
3.1250 mg | ORAL_TABLET | Freq: Two times a day (BID) | ORAL | Status: DC
  Administered 2014-02-06 – 2014-02-09 (×6): 3.125 mg via ORAL
  Filled 2014-02-06 (×8): qty 1

## 2014-02-06 NOTE — Progress Notes (Signed)
EEG completed; results pending.    

## 2014-02-06 NOTE — Progress Notes (Signed)
Patient has had two hypoglycemic events tonight while being NPO. Both events responded well to D50 25 mg. QHS Lantus held and no SSI has been given.  MD on call text paged, awaiting response. Will continue to monitor.  Tresa Endo

## 2014-02-06 NOTE — Evaluation (Signed)
Physical Therapy Evaluation Patient Details Name: [name] MRN: [id number] DOB: 11/08/1932 Today's Date: 02/06/2014   History of Present Illness  Metabolic encephalopathy with generalized weakness and multiple falls, likely due to combination of acute renal failure, NSTEMI, dehydration, and metabolic acidosis, possible lactic acidosis, GI bleed;  feeling weak for the last few weeks, few falls at home, at baseline she uses a walker and has early dementia.  Clinical Impression  Pt admitted with above. Pt currently with functional limitations due to the deficits listed below (see PT Problem List). Presents with a functional decline as she used a RW to walk in home distances.  Pt will benefit from skilled PT to increase their independence and safety with mobility to allow discharge to the venue listed below.       Follow Up Recommendations SNF    Equipment Recommendations  3in1 (PT)    Recommendations for Other Services       Precautions / Restrictions Precautions Precautions: Fall      Mobility  Bed Mobility Overal bed mobility: Needs Assistance Bed Mobility: Supine to Sit     Supine to sit: Max assist     General bed mobility comments: Requiring assist for all aspects of bed mobility including roll to semi-sidelie R, clearing LEs from bed, and elevating trunk to sit; physical assist/support at shoulder and pelvic girdles to position to sit  Transfers Overall transfer level: Needs assistance   Transfers: Sit to/from Stand;Stand Pivot Transfers Sit to Stand: Total assist Stand pivot transfers: Max assist       General transfer comment: max to total assist attempting sit to stand with without success -- had hoped that using RW would incr participation by approximating how she does things at home; Without successful standing, switched gears to a dependent squat pivot transfer with gait belt and knees blocked  Ambulation/Gait                Stairs             Wheelchair Mobility    Modified Rankin (Stroke Patients Only)       Balance Overall balance assessment: Needs assistance Sitting-balance support: Bilateral upper extremity supported;Feet supported Sitting balance-Leahy Scale: Poor                                       Pertinent Vitals/Pain Grimace with moving LEs off of bed patient repositioned for comfort and elevated for edema and pain control     Home Living Family/patient expects to be discharged to:: Skilled nursing facility Living Arrangements: Children                    Prior Function Level of Independence: Independent with assistive device(s)         Comments: was modified independent with RW until recently, with falls leading up to this admission     Hand Dominance        Extremity/Trunk Assessment   Upper Extremity Assessment: Generalized weakness;Defer to OT evaluation           Lower Extremity Assessment: Generalized weakness (Noted some grimace with ROM bil LEs)         Communication   Communication: Other (comment);Expressive difficulties (due to lethargy)  Cognition Arousal/Alertness: Lethargic Behavior During Therapy: Flat affect Overall Cognitive Status: Impaired/Different from baseline Area of Impairment: Attention;Following commands;Problem solving   Current Attention Level: Focused  Following Commands: Follows one step commands inconsistently;Follows one step commands with increased time     Problem Solving: Slow processing;Decreased initiation;Requires verbal cues;Requires tactile cues (hand over hand assist)      General Comments      Exercises        Assessment/Plan    PT Assessment Patient needs continued PT services  PT Diagnosis Difficulty walking;Generalized weakness;Acute pain   PT Problem List Decreased strength;Decreased activity tolerance;Decreased balance;Decreased mobility;Decreased cognition;Decreased knowledge of use of  DME;Decreased knowledge of precautions;Pain  PT Treatment Interventions DME instruction;Gait training;Functional mobility training;Therapeutic activities;Therapeutic exercise;Balance training;Neuromuscular re-education;Patient/family education   PT Goals (Current goals can be found in the Care Plan section) Acute Rehab PT Goals Patient Stated Goal: did not state, but smiled with the suggestion of getting OOB PT Goal Formulation: With patient/family Time For Goal Achievement: 02/20/14 Potential to Achieve Goals: Good    Frequency Min 3X/week   Barriers to discharge        Co-evaluation               End of Session Equipment Utilized During Treatment: Gait belt Activity Tolerance: Patient limited by lethargy Patient left: in chair;with call bell/phone within reach;with family/visitor present (Dr. Justin Mend examining pt) Nurse Communication: Mobility status;Need for lift equipment         Time: 0901-0922 PT Time Calculation (min): 21 min   Charges:   PT Evaluation $Initial PT Evaluation Tier I: 1 Procedure PT Treatments $Therapeutic Activity: 8-22 mins   PT G Codes:          [facility] [place] 02/06/2014, 1:06 PM  Roney Marion, [place] Pager [phone] Office [phone]

## 2014-02-06 NOTE — Progress Notes (Signed)
I have seen and evaluated the patient this AM along with Rosaria Ferries, PA-C. I agree with her findings, examination as well as impression recommendations.  Admitted with what appears to be a GI bleed - extremely low Hgb.  Resultant Renal & Hepatic Injury as well as Cardiac Injury from Type 2 MI (supply vs. Demand ischemia) -- Troponin level trending down.  As BP is elevated with low HR, agree with converting to Carvedilol - can up-titrate as HR tolerates.  She did not even wake up for my assessment.  Was aphasic for Nephrology.   At this point, supportive care is all we can provide.  If she has a meaningful recovery, could consider Non-invasive Ischemic eval with Cardiolite Nuc ST. -- just had Echo Last month with EF 40-45% & grade 2 Diastolic Dysfxn, Mod MR --> as she is not having Signs of CHF, no acute need to recheck at this point.  No statin due to hepatic failure.  No ASA until Hgb level stable.  Leonie Man, M.D., M.S. Interventional Cardiologist  [place] Pager # [phone] 02/06/2014

## 2014-02-06 NOTE — Progress Notes (Signed)
[facility] ROUNDING NOTE   Subjective:   Interval History:no change aphasic      Objective:  Vital signs in last 24 hours:  Temp:  [94.6 F (34.8 C)-97.8 F (36.6 C)] 97.6 F (36.4 C) (04/27 0425) Pulse Rate:  [55-74] 56 (04/27 0425) Resp:  [18-20] 20 (04/27 0425) BP: (137-157)/(36-60) 157/38 mmHg (04/27 0425) SpO2:  [98 %-100 %] 100 % (04/27 0425)  Weight change:  Filed Weights   02/05/14 0530  Weight: 66 kg (145 lb 8.1 oz)    Intake/Output: I/O last 3 completed shifts: In: 2275 [I.V.:1800; Blood:475] Out: 0076 [Urine:1325]   Intake/Output this shift:     CVS- RRR RS- CTA ABD- BS present soft non-distended EXT- no edema   Basic Metabolic Panel:  Recent Labs Lab 02/04/14 1209 02/04/14 1552 02/05/14 0115 02/05/14 0655 02/06/14 0510  NA 136*  --  137 133* 137  K 3.5*  --  4.1 4.8 3.5*  CL 101  --  101 99 98  CO2  --   --  13* 12* 24  GLUCOSE 97  --  111* 135* 62*  BUN 67*  --  71* 74* 76*  CREATININE 4.20* 4.00* 4.20* 4.25* 4.67*  CALCIUM  --   --  8.5 8.6 8.2*    Liver Function Tests:  Recent Labs Lab 02/05/14 0655 02/06/14 0510  AST 97* 126*  ALT 53* 89*  ALKPHOS 93 95  BILITOT 1.2 1.1  PROT 6.8 6.0  ALBUMIN 3.0* 2.7*    Recent Labs Lab 02/05/14 1650  LIPASE 28    Recent Labs Lab 02/05/14 1650  AMMONIA 26    CBC:  Recent Labs Lab 02/04/14 1158 02/04/14 1209 02/04/14 1810 02/05/14 0655 02/05/14 1650 02/06/14 0510  WBC 9.1  --  7.6 9.2  --  8.9  NEUTROABS 6.3  --   --   --   --   --   HGB 6.2* 8.2* 5.6* 9.1* 8.5* 8.3*  HCT 19.9* 24.0* 18.6* 27.5* 25.6* 25.0*  MCV 65.0*  --  65.0* 70.5*  --  70.8*  PLT 283  --  230 145*  --  198    Cardiac Enzymes:  Recent Labs Lab 02/04/14 1810 02/05/14 0115 02/05/14 0655 02/05/14 1650 02/05/14 2330  TROPONINI <0.30 0.58* 0.83* 1.83* 1.55*    BNP: No components found with this basename: POCBNP,   CBG:  Recent Labs Lab 02/05/14 2334 02/06/14 0005  02/06/14 0433 02/06/14 0534 02/06/14 0731  GLUCAP 45* 113* 54* 141* 101*    Microbiology: Results for orders placed during the hospital encounter of 02/04/14  MRSA PCR SCREENING     Status: None   Collection Time    02/05/14  8:03 AM      Result Value Ref Range Status   MRSA by PCR NEGATIVE  NEGATIVE Final   Comment:            The GeneXpert MRSA Assay (FDA     approved for NASAL specimens     only), is one component of a     comprehensive MRSA colonization     surveillance program. It is not     intended to diagnose MRSA     infection nor to guide or     monitor treatment for     MRSA infections.    Coagulation Studies: No results found for this basename: LABPROT, INR,  in the last 72 hours  Urinalysis:  Recent Labs  02/04/14 [place]  1.018  PHURINE 5.0  GLUCOSEU NEGATIVE  HGBUR NEGATIVE  BILIRUBINUR NEGATIVE  KETONESUR NEGATIVE  PROTEINUR 100*  UROBILINOGEN 0.2  NITRITE NEGATIVE  LEUKOCYTESUR NEGATIVE      Imaging: Ct Abdomen Pelvis Wo Contrast  02/05/2014   CLINICAL DATA:  Acute renal failure.  Anemia.  EXAM: CT ABDOMEN AND PELVIS WITHOUT CONTRAST  TECHNIQUE: Multidetector CT imaging of the abdomen and pelvis was performed following the standard protocol without intravenous contrast.  COMPARISON:  None.  FINDINGS: Trace right-sided pleural fluid is noted. Minimal bibasilar atelectasis is seen. Diffuse coronary artery calcifications are noted.  Evaluation is mildly suboptimal due to motion artifact.  The liver and spleen are unremarkable in appearance. There is haziness about the gallbladder, which could reflect gallbladder wall thickening or could be artifactual in nature. Would correlate for associated symptoms. The pancreas and adrenal glands are unremarkable.  Nonspecific perinephric stranding is noted bilaterally. The kidneys are otherwise unremarkable in appearance. There is no evidence of hydronephrosis. No renal or ureteral stones  are seen.  No free fluid is identified. The small bowel is unremarkable in appearance. The stomach is within normal limits. No acute vascular abnormalities are seen. Diffuse calcification is seen along the abdominal aorta and its branches, including along the superior mesenteric artery.  The appendix is not definitely characterized; there is no evidence for appendicitis. The colon is unremarkable in appearance.  The bladder is decompressed with a Foley catheter in place. The patient is status post hysterectomy. No suspicious adnexal masses are seen. No inguinal lymphadenopathy is seen.  No acute osseous abnormalities are identified. There is near-complete loss of the joint space at the left hip, with diffuse subcortical cystic change and cortical irregularity. Facet disease is noted at the lower lumbar spine.  IMPRESSION: 1. Haziness about the gallbladder could reflect gallbladder wall thickening, or could be artifactual in nature. Evaluation is mildly suboptimal due to motion artifact. Would correlate for associated symptoms. 2. No evidence of hydronephrosis; kidneys grossly unremarkable in appearance. 3. Diffuse calcification along the abdominal aorta and its branches, including along the superior mesenteric artery. 4. Diffuse coronary artery calcifications seen. 5. Trace right-sided pleural fluid noted. 6. Degenerative change noted at the left hip.   Electronically Signed   By: Garald Balding M.D.   On: 02/05/2014 03:17   Dg Chest 2 View  02/04/2014   CLINICAL DATA:  Altered mental status, history of myocardial infarction and pneumonia  EXAM: CHEST  2 VIEW  COMPARISON:  DG CHEST 1V PORT dated 12/03/2013  FINDINGS: Moderate enlargement of cardiac silhouette. Vascular pattern is normal. No consolidation or effusion.  IMPRESSION: Moderate enlargement of the cardiac silhouette.  Lungs clear.   Electronically Signed   By: Skipper Cliche M.D.   On: 02/04/2014 12:18   Ct Head Wo Contrast  02/04/2014   CLINICAL  DATA:  Altered mental status, weakness  EXAM: CT HEAD WITHOUT CONTRAST  TECHNIQUE: Contiguous axial images were obtained from the base of the skull through the vertex without intravenous contrast.  COMPARISON:  None.  FINDINGS: No evidence of parenchymal hemorrhage or extra-axial fluid collection. No mass lesion, mass effect, or midline shift.  No CT evidence of acute infarction.  Subcortical white matter and periventricular small vessel ischemic changes. Intracranial atherosclerosis.  Age related atrophy.  No ventriculomegaly.  Partial opacification of the right frontal sinus. Visualized paranasal sinuses and mastoid air cells are otherwise clear.  No evidence of calvarial fracture.  IMPRESSION: No evidence of acute intracranial abnormality.  Age  related atrophy with small vessel ischemic changes and intracranial atherosclerosis.   Electronically Signed   By: Julian Hy M.D.   On: 02/04/2014 12:44     Medications:   . dextrose 5 % and 0.9% NaCl 10 mL/hr (02/06/14 0601)  . pantoprozole (PROTONIX) infusion 8 mg/hr (02/06/14 0034)   . amLODipine  10 mg Oral Daily  . atorvastatin  40 mg Oral q1800  . insulin aspart  0-9 Units Subcutaneous Q4H  . metoprolol tartrate  12.5 mg Oral BID  . multivitamin with minerals  1 tablet Oral Daily  . nitroGLYCERIN  0.5 inch Topical 4 times per day  . sodium chloride  3 mL Intravenous Q12H   feeding supplement (GLUCERNA SHAKE), hydrALAZINE, metoprolol, nitroGLYCERIN, ondansetron (ZOFRAN) IV, ondansetron  Assessment/ Plan:  The patient is a 80 y.o. year-old with hx of CAD, DM2, HTN, NICM EF 25%, gastroparesis, HL, OSA, neuropathy, chronic LBP, hx CVA who presented to ED from home with AMS.   I agree with Dr Jonnie Finner and would not recommend dialysis   Acute on chronic renal failure in setting of ACE and diuretics and volume depletion  Will continue to follow but hopefully should recover tomorrow    LOS: 2 Sherril Croon @TODAY @11 :05 AM

## 2014-02-06 NOTE — Procedures (Signed)
EEG report.  Brief clinical history:  80 years old with altered mental status.. Technique: this is a 17 channel routine scalp EEG performed at the bedside with bipolar and monopolar montages arranged in accordance to the international 10/20 system of electrode placement. One channel was dedicated to EKG recording.  No activating procedures performed.  Description: as the study begins and throughout the entire recording, the is diffuse, continuous, monomorphic theta activity that doesn't follow an ictal pattern at any time. No focal or generalized epileptiform discharges noted.   EKG showed sinus rhythm.  Impression: this is an abnormal EEG with findings supportive of a mild encephalopathy, non specific as to cause. No electrographic seizures noted. Clinical correlation is advised.  Dorian Pod, MD

## 2014-02-06 NOTE — Progress Notes (Signed)
Speech Language Pathology Treatment: Dysphagia  Patient Details Name: [name] MRN: [id number] DOB: October 13, 1933 Today's Date: 02/06/2014 Time: 4270-6237 SLP Time Calculation (min): 23 min  Assessment / Plan / Recommendation Clinical Impression  Pt more alert and engaged in PO trials this morning, with more timely swallow response from time of presentation. Pt did demonstrate difficulty masticating soft solids, which may be impacted by lack of dentition. Pt exhibited immediate and delayed coughing with Dys 2 trials, which SLP suspects is due to disorganized bolus formation and decreased sustained attention resulting in premature spillage of bolus material. At this time, recommend to initiate Dys 1 textures and thin liquids under full supervision. SLP encouraged family to bring in dentures from home to facilitate a more timely oral preparation with advanced solids.   HPI HPI: [name] is a 80 y.o. female, lives at home with her son with h/o nonischemic cardiomyopathy, chronic combined systolic and diastolic CHF, OSA, hypertension, type 2 diabetes mellitus, diabetic gastroparesis, dyslipidemia, hypertension, CVA in the past, GI bleed in 2012 due to erosive gastritis, who was recently admitted for CHF comes back to the hospital after feeling weak for the last few weeks, few falls at home, at baseline she uses a walker and has early dementia. In the ER she was found to be obtunded, workup was suggestive of dehydration with acute renal failure, CT head chest x-ray was unremarkable.   Pertinent Vitals N/A  SLP Plan  Continue with current plan of care    Recommendations Diet recommendations: Dysphagia 1 (puree);Thin liquid Liquids provided via: Cup;Straw Medication Administration: Crushed with puree Supervision: Staff to assist with self feeding;Full supervision/cueing for compensatory strategies Compensations: Slow rate;Small sips/bites Postural Changes and/or Swallow Maneuvers: Seated upright  90 degrees;Upright 30-60 min after meal              Oral Care Recommendations: Oral care BID Follow up Recommendations: Skilled Nursing facility Plan: Continue with current plan of care    GO      Germain Osgood, M.A. CCC-SLP [phone]  Germain Osgood 02/06/2014, 10:00 AM

## 2014-02-06 NOTE — Progress Notes (Signed)
[facility] PROGRESS NOTE  [name] YHC:[id number] DOB: [birth year] DOA: 02/04/2014 PCP: [name], MD  Assessment/Plan  Metabolic encephalopathy, resolving, but speech not back to normal and still weak.  Ddx includes seizure, stroke, and  combination of acute renal failure, NSTEMI type 2, dehydration, and metabolic acidosis and GI bleed -  Treat underlying medical problems -  Ammonia level 26 -  UA neg -  CXR neg -  Head CT stable -  MRI -  EEG -  Neurology consult if MRI positive -  PT/OT/SLP -  Anticipate restarting diet today now that more awake  Possible NSTEMI, likely type 2, with hx of known CAD, peak troponin 1.83 on 4/26. Given her comorbidities, particularly her renal failure, she is not a good candidate for left heart catheterization or invasive procedure -  Decrease beta blocker to BID due to bradycardia -  Restart statin -  No ACE inhibitor secondary to acute renal failure -  Aspirin held because of concern for GIB -  Nitroglycerin when necessary -  Daily ECG -  Tele:  Sinus bradycardia with PVC  Acute renal failure, baseline creatinine 1.09, current creatinine clearance <15 and creatinine continues to rise -  No hydronephrosis on CT abdomen pelvis -  FENa 0.72% -  Given age and comorbidities, would likely not be a good candidate for hemodialysis -  BUN and creatinine continue to rise -  Appreciate Nephrology assistance  Acute blood loss anemia, Hemoccult-positive, hgb drifting down.  Previous endoscopies for similar bleeding/anemia demonstrated erosive gastritis and normal colonoscopy.   -  Continue Protonix infusion -  GI consult today  Abnl gallbladder with possible wall thickening on CT -  No abdominal pain on exam -  AST/ALT rising, bili wnl -  Lipase wnl  Mild transaminitis -  Check hepatitis panel  +anion gap metabolic acidosis, may be secondary to elevated BUN. May also have some underlying RTA from renal failure, resolved with sodium  bicarb fluids -  lactic acid 1.5 -  Stop sodium bicarb fluids  DM2, A1c 11.5, hypoglycemic overnight despite not receiving lantus -  lantus discontinued -  Increase dextrose fluids -  Continue low dose SSI  ICM with EF 40-45% and grade 2 DD, no rales on exam -  Judicious use of IVF -  Monitor for SOB -  BB okay, but ACEI on hold due to AKI  HTN, blood pressure mildly elevated -  Decrease metoprolol due to bradycardia -  Restart norvasc  Hyponatremia, worsened despite IVF  OSA, stable.  Not on CPAP  Hx of CVA with mild cognitive deficits at baseline  Diet:  NPO Access:  PIV IVF:  yes Proph:  SCDs  Code Status: DNR Family Communication: patient and her daughter Disposition Plan: pending improvement in mentation and renal function   Consultants:  Cardiology  Nephrology  Procedures:  CXR  CT head  CT abd/pelvis  Antibiotics:  none   HPI/Subjective:  Patient awake and speaking some.  Denies pain, SOB, nausea, diarrhea.  Able to follow some commands  Objective: Filed Vitals:   02/05/14 1430 02/05/14 1759 02/05/14 2340 02/06/14 0425  BP: 154/56 154/56 137/36 157/38  Pulse: 74 65 55 56  Temp: 97.8 F (36.6 C)  94.6 F (34.8 C) 97.6 F (36.4 C)  TempSrc: Axillary  Axillary Oral  Resp: 20 18 18 20   Height:      Weight:      SpO2: 98% 100% 99% 100%    Intake/Output Summary (Last  24 hours) at 02/06/14 0851 Last data filed at 02/06/14 7893  Gross per 24 hour  Intake   1800 ml  Output    575 ml  Net   1225 ml   Filed Weights   02/05/14 0530  Weight: 66 kg (145 lb 8.1 oz)    Exam:   General:  African American female, NAD  HEENT:  NCAT, MMM  Cardiovascular:  RRR, nl S1, S2 no mrg, 2+ pulses, warm extremities  Respiratory:  CTAB, no rales, no increased WOB  Abdomen:   NABS, soft, NT/ND  MSK:   Normal tone and bulk, no LEE  Neuro:  CN II-XII grossly intact, grossly moves all extremities, strength 5/5 upper extremities bilaterally.   Lower extremities seem to be 5/5, however, she had difficulty following commands to move her knees and legs.  Sensation intact to light touch.  No obvious neglect, unable to follow commands for neglect testing.    Data Reviewed: Basic Metabolic Panel:  Recent Labs Lab 02/04/14 1209 02/04/14 1552 02/05/14 0115 02/05/14 0655 02/06/14 0510  NA 136*  --  137 133* 137  K 3.5*  --  4.1 4.8 3.5*  CL 101  --  101 99 98  CO2  --   --  13* 12* 24  GLUCOSE 97  --  111* 135* 62*  BUN 67*  --  71* 74* 76*  CREATININE 4.20* 4.00* 4.20* 4.25* 4.67*  CALCIUM  --   --  8.5 8.6 8.2*   Liver Function Tests:  Recent Labs Lab 02/05/14 0655 02/06/14 0510  AST 97* 126*  ALT 53* 89*  ALKPHOS 93 95  BILITOT 1.2 1.1  PROT 6.8 6.0  ALBUMIN 3.0* 2.7*    Recent Labs Lab 02/05/14 1650  LIPASE 28    Recent Labs Lab 02/05/14 1650  AMMONIA 26   CBC:  Recent Labs Lab 02/04/14 1158 02/04/14 1209 02/04/14 1810 02/05/14 0655 02/05/14 1650 02/06/14 0510  WBC 9.1  --  7.6 9.2  --  8.9  NEUTROABS 6.3  --   --   --   --   --   HGB 6.2* 8.2* 5.6* 9.1* 8.5* 8.3*  HCT 19.9* 24.0* 18.6* 27.5* 25.6* 25.0*  MCV 65.0*  --  65.0* 70.5*  --  70.8*  PLT 283  --  230 145*  --  198   Cardiac Enzymes:  Recent Labs Lab 02/04/14 1810 02/05/14 0115 02/05/14 0655 02/05/14 1650 02/05/14 2330  TROPONINI <0.30 0.58* 0.83* 1.83* 1.55*   BNP (last 3 results)  Recent Labs  12/03/13 1808 12/05/13 0448  PROBNP 7767.0* 6023.0*   CBG:  Recent Labs Lab 02/05/14 2334 02/06/14 0005 02/06/14 0433 02/06/14 0534 02/06/14 0731  GLUCAP 57* 113* 65* 141* 101*    Recent Results (from the past 240 hour(s))  MRSA PCR SCREENING     Status: None   Collection Time    02/05/14  8:03 AM      Result Value Ref Range Status   MRSA by PCR NEGATIVE  NEGATIVE Final   Comment:            The GeneXpert MRSA Assay (FDA     approved for NASAL specimens     only), is one component of a     comprehensive MRSA  colonization     surveillance program. It is not     intended to diagnose MRSA     infection nor to guide or     monitor treatment for  MRSA infections.     Studies: Ct Abdomen Pelvis Wo Contrast  02/05/2014   CLINICAL DATA:  Acute renal failure.  Anemia.  EXAM: CT ABDOMEN AND PELVIS WITHOUT CONTRAST  TECHNIQUE: Multidetector CT imaging of the abdomen and pelvis was performed following the standard protocol without intravenous contrast.  COMPARISON:  None.  FINDINGS: Trace right-sided pleural fluid is noted. Minimal bibasilar atelectasis is seen. Diffuse coronary artery calcifications are noted.  Evaluation is mildly suboptimal due to motion artifact.  The liver and spleen are unremarkable in appearance. There is haziness about the gallbladder, which could reflect gallbladder wall thickening or could be artifactual in nature. Would correlate for associated symptoms. The pancreas and adrenal glands are unremarkable.  Nonspecific perinephric stranding is noted bilaterally. The kidneys are otherwise unremarkable in appearance. There is no evidence of hydronephrosis. No renal or ureteral stones are seen.  No free fluid is identified. The small bowel is unremarkable in appearance. The stomach is within normal limits. No acute vascular abnormalities are seen. Diffuse calcification is seen along the abdominal aorta and its branches, including along the superior mesenteric artery.  The appendix is not definitely characterized; there is no evidence for appendicitis. The colon is unremarkable in appearance.  The bladder is decompressed with a Foley catheter in place. The patient is status post hysterectomy. No suspicious adnexal masses are seen. No inguinal lymphadenopathy is seen.  No acute osseous abnormalities are identified. There is near-complete loss of the joint space at the left hip, with diffuse subcortical cystic change and cortical irregularity. Facet disease is noted at the lower lumbar spine.   IMPRESSION: 1. Haziness about the gallbladder could reflect gallbladder wall thickening, or could be artifactual in nature. Evaluation is mildly suboptimal due to motion artifact. Would correlate for associated symptoms. 2. No evidence of hydronephrosis; kidneys grossly unremarkable in appearance. 3. Diffuse calcification along the abdominal aorta and its branches, including along the superior mesenteric artery. 4. Diffuse coronary artery calcifications seen. 5. Trace right-sided pleural fluid noted. 6. Degenerative change noted at the left hip.   Electronically Signed   By: Garald Balding M.D.   On: 02/05/2014 03:17   Dg Chest 2 View  02/04/2014   CLINICAL DATA:  Altered mental status, history of myocardial infarction and pneumonia  EXAM: CHEST  2 VIEW  COMPARISON:  DG CHEST 1V PORT dated 12/03/2013  FINDINGS: Moderate enlargement of cardiac silhouette. Vascular pattern is normal. No consolidation or effusion.  IMPRESSION: Moderate enlargement of the cardiac silhouette.  Lungs clear.   Electronically Signed   By: Skipper Cliche M.D.   On: 02/04/2014 12:18   Ct Head Wo Contrast  02/04/2014   CLINICAL DATA:  Altered mental status, weakness  EXAM: CT HEAD WITHOUT CONTRAST  TECHNIQUE: Contiguous axial images were obtained from the base of the skull through the vertex without intravenous contrast.  COMPARISON:  None.  FINDINGS: No evidence of parenchymal hemorrhage or extra-axial fluid collection. No mass lesion, mass effect, or midline shift.  No CT evidence of acute infarction.  Subcortical white matter and periventricular small vessel ischemic changes. Intracranial atherosclerosis.  Age related atrophy.  No ventriculomegaly.  Partial opacification of the right frontal sinus. Visualized paranasal sinuses and mastoid air cells are otherwise clear.  No evidence of calvarial fracture.  IMPRESSION: No evidence of acute intracranial abnormality.  Age related atrophy with small vessel ischemic changes and intracranial  atherosclerosis.   Electronically Signed   By: Julian Hy M.D.   On: 02/04/2014 12:44  Scheduled Meds: . insulin aspart  0-9 Units Subcutaneous Q4H  . metoprolol  2.5 mg Intravenous 4 times per day  . nitroGLYCERIN  0.5 inch Topical 4 times per day  . potassium chloride  10 mEq Intravenous Q1 Hr x 2  . sodium chloride  3 mL Intravenous Q12H   Continuous Infusions: . dextrose 5 % and 0.9% NaCl 10 mL/hr (02/06/14 0601)  . pantoprozole (PROTONIX) infusion 8 mg/hr (02/06/14 0034)  .  sodium bicarbonate 150 mEq in sterile water 1000 mL infusion 125 mL/hr at 02/06/14 0601    Active Problems:   DM (diabetes mellitus), type 2, uncontrolled   GASTROPARESIS, DIABETIC   HYPERLIPIDEMIA   ANEMIA   OBSTRUCTIVE SLEEP APNEA   HYPERTENSION   CEREBRAL EMBOLISM, WITH INFARCTION   GERD   DEGENERATIVE JOINT DISEASE   CAD (coronary artery disease)   GI bleed   ARF (acute renal failure)   Nonischemic cardiomyopathy   Chronic diastolic CHF EF 24%   Non-ST elevation MI (NSTEMI)   Renal failure, acute   NSTEMI (non-ST elevated myocardial infarction)    Time spent: 30 min    [facility] Pager [phone]. If 7PM-7AM, please contact night-coverage at [email or web address], password [facility] 02/06/2014, 8:51 AM  LOS: 2 days

## 2014-02-06 NOTE — Progress Notes (Signed)
Patient Name: [name] Date of Encounter: 02/06/2014  Active Problems:   DM (diabetes mellitus), type 2, uncontrolled   GASTROPARESIS, DIABETIC   HYPERLIPIDEMIA   ANEMIA   OBSTRUCTIVE SLEEP APNEA   HYPERTENSION   CEREBRAL EMBOLISM, WITH INFARCTION   GERD   DEGENERATIVE JOINT DISEASE   CAD (coronary artery disease)   GI bleed   ARF (acute renal failure)   Nonischemic cardiomyopathy   Chronic diastolic CHF EF 67%   Non-ST elevation MI (NSTEMI)   Renal failure, acute   NSTEMI (non-ST elevated myocardial infarction)    Patient Profile: 80 yo female w/ NICM, chronic combined systolic and diastolic CHF EF 61% 95/0932, OSA, HTN, type 2 DM now insulin-dependent, gastroparesis, dyslipidemia, remote CVA, GIB 2012 w/ erosive gastritis, admitted 02/21-02/23 w/ CHF and d/c on ACE inhibitor plus diuretic was admitted 04/25 w/ weakness, few falls at home. Cardiology consult 04/26 for elevated troponin.    SUBJECTIVE: Pt responds to her name and to some verbal commands. Does not appear to recognize family.  OBJECTIVE Filed Vitals:   02/05/14 1430 02/05/14 1759 02/05/14 2340 02/06/14 0425  BP: 154/56 154/56 137/36 157/38  Pulse: 74 65 55 56  Temp: 97.8 F (36.6 C)  94.6 F (34.8 C) 97.6 F (36.4 C)  TempSrc: Axillary  Axillary Oral  Resp: 20 18 18 20   Height:      Weight:      SpO2: 98% 100% 99% 100%    Intake/Output Summary (Last 24 hours) at 02/06/14 1026 Last data filed at 02/06/14 0940  Gross per 24 hour  Intake   1800 ml  Output    575 ml  Net   1225 ml   Filed Weights   02/05/14 0530  Weight: 145 lb 8.1 oz (66 kg)    PHYSICAL EXAM General: Well developed, well nourished, female in no acute distress. Head: Normocephalic, atraumatic.  Neck: Supple without bruits, JVD not elevated. Lungs:  Resp regular and unlabored, few rales, good air exchange. Heart: RRR, S1, S2, no S3, S4, 3/6 murmur; no rub. Abdomen: Soft, non-tender, non-distended, BS + x 4.    Extremities: No clubbing, cyanosis, no edema.  Neuro: Alert and oriented X 1. Moves all extremities spontaneously.  LABS: CBC: Recent Labs  02/04/14 1158  02/05/14 0655 02/05/14 1650 02/06/14 0510  WBC 9.1  < > 9.2  --  8.9  NEUTROABS 6.3  --   --   --   --   HGB 6.2*  < > 9.1* 8.5* 8.3*  HCT 19.9*  < > 27.5* 25.6* 25.0*  MCV 65.0*  < > 70.5*  --  70.8*  PLT 283  < > 145*  --  198  < > = values in this interval not displayed.  Basic Metabolic Panel: Recent Labs  02/05/14 0655 02/06/14 0510  NA 133* 137  K 4.8 3.5*  CL 99 98  CO2 12* 24  GLUCOSE 135* 62*  BUN 74* 76*  CREATININE 4.25* 4.67*  CALCIUM 8.6 8.2*   Liver Function Tests: Recent Labs  02/05/14 0655 02/06/14 0510  AST 97* 126*  ALT 53* 89*  ALKPHOS 93 95  BILITOT 1.2 1.1  PROT 6.8 6.0  ALBUMIN 3.0* 2.7*   Cardiac Enzymes: Recent Labs  02/05/14 0655 02/05/14 1650 02/05/14 2330  TROPONINI 0.83* 1.83* 1.55*    Recent Labs  02/04/14 1207  TROPIPOC 0.12*   Hemoglobin A1C: Recent Labs  02/04/14 1552  HGBA1C 11.5*   Anemia Panel:  Recent Labs  02/04/14 1404  VITAMINB12 684  FOLATE >20.0  FERRITIN 8*  RETICCTPCT 1.3    TELE:  SR, S brady much of the time. Generally 50s, but sometimes high 40s.  Radiology/Studies: Ct Abdomen Pelvis Wo Contrast 02/05/2014   CLINICAL DATA:  Acute renal failure.  Anemia.  EXAM: CT ABDOMEN AND PELVIS WITHOUT CONTRAST  TECHNIQUE: Multidetector CT imaging of the abdomen and pelvis was performed following the standard protocol without intravenous contrast.  COMPARISON:  None.  FINDINGS: Trace right-sided pleural fluid is noted. Minimal bibasilar atelectasis is seen. Diffuse coronary artery calcifications are noted.  Evaluation is mildly suboptimal due to motion artifact.  The liver and spleen are unremarkable in appearance. There is haziness about the gallbladder, which could reflect gallbladder wall thickening or could be artifactual in nature. Would correlate  for associated symptoms. The pancreas and adrenal glands are unremarkable.  Nonspecific perinephric stranding is noted bilaterally. The kidneys are otherwise unremarkable in appearance. There is no evidence of hydronephrosis. No renal or ureteral stones are seen.  No free fluid is identified. The small bowel is unremarkable in appearance. The stomach is within normal limits. No acute vascular abnormalities are seen. Diffuse calcification is seen along the abdominal aorta and its branches, including along the superior mesenteric artery.  The appendix is not definitely characterized; there is no evidence for appendicitis. The colon is unremarkable in appearance.  The bladder is decompressed with a Foley catheter in place. The patient is status post hysterectomy. No suspicious adnexal masses are seen. No inguinal lymphadenopathy is seen.  No acute osseous abnormalities are identified. There is near-complete loss of the joint space at the left hip, with diffuse subcortical cystic change and cortical irregularity. Facet disease is noted at the lower lumbar spine.  IMPRESSION: 1. Haziness about the gallbladder could reflect gallbladder wall thickening, or could be artifactual in nature. Evaluation is mildly suboptimal due to motion artifact. Would correlate for associated symptoms. 2. No evidence of hydronephrosis; kidneys grossly unremarkable in appearance. 3. Diffuse calcification along the abdominal aorta and its branches, including along the superior mesenteric artery. 4. Diffuse coronary artery calcifications seen. 5. Trace right-sided pleural fluid noted. 6. Degenerative change noted at the left hip.   Electronically Signed   By: Garald Balding M.D.   On: 02/05/2014 03:17   Dg Chest 2 View 02/04/2014   CLINICAL DATA:  Altered mental status, history of myocardial infarction and pneumonia  EXAM: CHEST  2 VIEW  COMPARISON:  DG CHEST 1V PORT dated 12/03/2013  FINDINGS: Moderate enlargement of cardiac silhouette.  Vascular pattern is normal. No consolidation or effusion.  IMPRESSION: Moderate enlargement of the cardiac silhouette.  Lungs clear.   Electronically Signed   By: Skipper Cliche M.D.   On: 02/04/2014 12:18   Ct Head Wo Contrast 02/04/2014   CLINICAL DATA:  Altered mental status, weakness  EXAM: CT HEAD WITHOUT CONTRAST  TECHNIQUE: Contiguous axial images were obtained from the base of the skull through the vertex without intravenous contrast.  COMPARISON:  None.  FINDINGS: No evidence of parenchymal hemorrhage or extra-axial fluid collection. No mass lesion, mass effect, or midline shift.  No CT evidence of acute infarction.  Subcortical white matter and periventricular small vessel ischemic changes. Intracranial atherosclerosis.  Age related atrophy.  No ventriculomegaly.  Partial opacification of the right frontal sinus. Visualized paranasal sinuses and mastoid air cells are otherwise clear.  No evidence of calvarial fracture.  IMPRESSION: No evidence of acute intracranial abnormality.  Age  related atrophy with small vessel ischemic changes and intracranial atherosclerosis.   Electronically Signed   By: Julian Hy M.D.   On: 02/04/2014 12:44     Current Medications:  . amLODipine  10 mg Oral Daily  . atorvastatin  40 mg Oral q1800  . insulin aspart  0-9 Units Subcutaneous Q4H  . metoprolol tartrate  12.5 mg Oral BID  . nitroGLYCERIN  0.5 inch Topical 4 times per day  . sodium chloride  3 mL Intravenous Q12H   . dextrose 5 % and 0.9% NaCl 10 mL/hr (02/06/14 0601)  . pantoprozole (PROTONIX) infusion 8 mg/hr (02/06/14 0034)    ASSESSMENT AND PLAN: 1. NSTEMI Type GD:[id number], with demand ischemia in the setting of anemia and chronic kidney disease, along with reduced ejection fraction of 45% per most recent echo. There is no evidence of CHF on exam. She is confused and was not on PO medications, but now they can be crushed and given. D/c nitrates for now, add Isordil if she has  ischemic symptoms. Will not plan repeat echo as she just had had in March of this year and is unlikely to change treatment regimen. No ischemic work up. No ASA due to anemia with hemoccult positive stools. No ACE due to renal failure. No statin due to elevated LFTs.   2. CAD: Most recent cardiac cath in 2011 with non-obstructive CAD. She has systolic dysfunction per echo which is improved to 40-45% from cardiac cath measurements of 20%-30%. Continue BB.   3. Atrial fibrillation: Admission EKG read as Atrial fib, but is actually Sinus brady. NSR since admission. No anticoagulant indicated.   4. Acute Metabolic Encephalopathy: Significant confusion, with acute renal failure. IV fluid hydration is provided per IM/Renal.   5. Anemia: Hemoccult positive stools on admission with Hgb 5.6. Received blood transfusion 2 units. On protonix infusion. 9.1 after transfusion, now trending down, per IM.   6. Acute Renal failure: Creatinine at baseline 1.09 but has been trending upwards since Feb of 2015 with significant jump from 1.85 to 4.20 on admission. No hemodialysis planned.  7. Hypertension: Blood pressure is increasing with fluid hydration, and blood transfusion. She is on metoprolol (changed to PO today), can change to Coreg for more BP reduction with less effect on heart rate. Amlodipine d/c'd. Hydralazine added prn for BP elevation but has not been given. MD advise on oral hydralazine.  8. Recent Fall: Likely due to anemia and dehydration. Per IM. Was living w/ family PTA.  Otherwise, per IM. Active Problems:   DM (diabetes mellitus), type 2, uncontrolled   GASTROPARESIS, DIABETIC   HYPERLIPIDEMIA   ANEMIA   OBSTRUCTIVE SLEEP APNEA   HYPERTENSION   CEREBRAL EMBOLISM, WITH INFARCTION   GERD   DEGENERATIVE JOINT DISEASE   CAD (coronary artery disease)   GI bleed   ARF (acute renal failure)   Nonischemic cardiomyopathy   Chronic diastolic CHF EF 97%   Non-ST elevation MI (NSTEMI)   Renal  failure, acute   NSTEMI (non-ST elevated myocardial infarction)    Lemont Fillers , PA-C 10:26 AM 02/06/2014

## 2014-02-06 NOTE — Consult Note (Signed)
[facility] Consult: 11:02 AM 02/06/2014  LOS: 2 days    Referring Provider: Dr Janece Canterbury. Primary Care Physician:  [name], MD Primary Gastroenterologist:  Dr. Carlean Purl    Reason for Consultation:  GIB, anemia.    HPI: [name] is a 80 y.o. female.  PMH of nonischemic cardiomyopathy.  EF 45% 2015,  OSA, HTN, insulin-dependent type 2 DM, diabetic gastroparesis. CVA in past.  Admitted 2/21 - [id number] with diastolic CHF, diuresed one liter and discharged home on ACEI and Demadex. Also discharged on twice daly po Iron which she was taking at admission.  Hgb of 8.9 with normal MCV.  Has not been on PPI.  Interestingly, patient had an echocardiogram 2011 with ejection fraction of 20-25%.  But echocardiogram in 0000000 noted no systolic dysfunction and grade 1 diastolic dysfunction.  Repeat echo 12/28/13 shows EF 40 to 45% and grade 2 diastolic dysfunction.   Dropped 21 # since that time.   GI bleed and transfusion requiring anemia in 2012 due to gastritis in setting of INR of 3.4.  Hgb nadir of 5.1.  She no longer takes Coumadin.   Brought to ED from home yesterday with AMS. Found to be in ARF (67/4.2 c/w 25/1.8 in Feb 2015).  Renal feels this is acute on chronic renal failure in setting of ACE and diuretics and volume depletion.  Do not recommend dialysis.  Mild elevation of Troponin (1.83 max).  Cardiology has ruled her in for  Non STEMI.   She is anemic with Hgb of 6.2, MCV 65. Was 8.9/78 at discharge 12/05/13. Ferritin is 8. Transfused up to 8.2.  She is FOBT +.  Was not on PPI at home. And not using for a long time.  Also note that AST/ALT are increased to 126/89 and they were WNL 2 months ago. Non contrast CT shows ?  wall thickening vs artifact in the pericholecystic hazziness.  Also reveals diffuse  calcification of abd aorta and branch vessels including the SMA.    Family says she became anorexic on 4/25.  Had black loose stool on 4/24, 2 on 4/26.  Normally daily brown stools.  The expressive aphasia started at 11 PM on 4/25, by 4/26 she was obtunded. Fell on 4/23, 4/24 and 4/25.  Up until last week, appetite and mental status were good.  Takes Goddies/BC powders prn, one per day on 3 occasions in the last 2 weeks.  No NSAIDs.   Sugars are poorly controlled with Hgb A1c of 11.5 now, 1o.6 2 months ago. Son (she lives with) says sugars run 1teens to 300s.  Oliguria began on 4/24.   Past Medical History  Diagnosis Date  . Asthma   . CAD (coronary artery disease)     Pt reports MI in 2006 (no documentation).  Cardiolite in 05/2002 and 07/2006 did not reveal any reversible ischemia.  Pt follows with Dr. Rex Kras at [facility].  . CHF (congestive heart failure)     EF 25-30% with dilated LV, mild LVH, severe hypokinesis, and mod-severe reduction in RV function  .  Osteoporosis   . HYPERTENSION 08/03/2006  . GASTROPARESIS, DIABETIC 08/03/2006  . HYPERLIPIDEMIA 08/03/2006  . OBSTRUCTIVE SLEEP APNEA 01/06/2008  . PERIPHERAL NEUROPATHY 08/03/2006  . GERD 08/03/2006  . LOW BACK PAIN, CHRONIC 08/03/2006  . OSTEOPOROSIS 03/21/2009  . CEREBRAL EMBOLISM, WITH INFARCTION 07/02/2010  . Angina   . Myocardial infarction "2 or 3"  . Pneumonia 02/26/12    "a few times; probably even today"  . Shortness of breath     "all the time"  . DIABETES MELLITUS, TYPE II 11/04/1983  . Blood transfusion 08/2011  . Lower GI bleeding 08/2011 and 01/2014  . Chronic daily headache   . Migraines   . Stroke summer 2011    "made my left hip worse"  . Uterine cancer   . Nonischemic cardiomyopathy 05/2010    Left heart catheterization:2011. Nonobstructive coronary artery disease.  . Pulmonary hypertension   . Hypertension     Past Surgical History  Procedure Laterality Date  . Esophagogastroduodenoscopy  08/26/2011     Procedure: ESOPHAGOGASTRODUODENOSCOPY (EGD);  Surgeon: [name], MD;  Location: [facility] ENDOSCOPY;  Service: Endoscopy;  Laterality: N/A;  . Colonoscopy  08/28/2011    Procedure: COLONOSCOPY;  Surgeon: [name], MD;  Location: [place];  Service: Endoscopy;  Laterality: N/A;  . Vaginal hysterectomy    . Tubal ligation    . Cataract extraction w/ intraocular lens  implant, bilateral    . Toe surgery      "right big toe; operated on it to straighten it out; it was under"  . Polysomnogram  10/17/2005    AHI-7.28/hr. AHI REM-20.8/hr. Average oxygen saturation range during REM and NREM was 97%. Lowest oxygen saturation during REM sleep was 90%.  . Carotid duplex  05/28/2010    No significant extracranial carotid artery stenosis demonstrated. Vertebrals are patent w/ antegrade flow.  . Cardiac catheterization  05/24/2010    No intervention - recommed medical therapy.  . Cardiovascular stress test  08/07/2006    Moderate-severe defect seen in Basal inferior, Mid inferoseptal, Mid inferior, Mid inferolateral, and Apical inferior regions - consistent w/ infarct/scar. No scintigraphic evidence of inducible myocardial ischemia.  . Transthoracic echocardiogram  08/29/2011    EF 55-60%, moderate LVH,     Prior to Admission medications   Medication Sig Start Date End Date Taking? Authorizing Provider  acetaminophen (TYLENOL) 500 MG tablet Take 500 mg by mouth every 6 (six) hours as needed for moderate pain or headache.   Yes Historical Provider, MD  albuterol (PROAIR HFA) 108 (90 BASE) MCG/ACT inhaler Inhale 2 puffs into the lungs every 6 (six) hours as needed for wheezing. 10/12/12  Yes Karren Cobble, MD  amLODipine (NORVASC) 10 MG tablet Take 1 tablet (10 mg total) by mouth daily. 02/07/13  Yes Karren Cobble, MD  aspirin 81 MG EC tablet Take 81 mg by mouth daily.     Yes Historical Provider, MD  insulin aspart (NOVOLOG FLEXPEN) 100 UNIT/ML FlexPen Inject 7 Units into the skin daily with  breakfast.    Yes Historical Provider, MD  insulin glargine (LANTUS) 100 UNIT/ML injection Inject 4-34 Units into the skin 2 (two) times daily. Inject 4 units at dinner and 34 units at bedtime 12/12/11 12/11/14 Yes Nils Pyle, MD  isosorbide mononitrate (IMDUR) 60 MG 24 hr tablet Take 1 tablet (60 mg total) by mouth daily. 02/07/13  Yes Karren Cobble, MD  lisinopril (PRINIVIL,ZESTRIL) 5 MG tablet Take 5 mg by mouth daily.   Yes  Historical Provider, MD  loratadine (ALLERGY RELIEF) 10 MG tablet Take 10 mg by mouth daily.   Yes Historical Provider, MD  metoprolol succinate (TOPROL-XL) 25 MG 24 hr tablet Take 1 tablet (25 mg total) by mouth daily. 12/05/13 12/05/14 Yes Annita Brod, MD  Multiple Vitamins-Minerals (ALIVE WOMENS 50+ PO) Take 1 tablet by mouth daily.   Yes Historical Provider, MD  nitroGLYCERIN (NITROSTAT) 0.4 MG SL tablet Place 1 tablet (0.4 mg total) under the tongue every 5 (five) minutes as needed. For chest pain. 03/15/12  Yes Nils Pyle, MD  polyethylene glycol [facility] / GLYCOLAX) packet Take 17 g by mouth daily.   Yes Historical Provider, MD  potassium chloride (K-DUR) 10 MEQ tablet Take 10 mEq by mouth daily.   Yes Historical Provider, MD  rosuvastatin (CRESTOR) 20 MG tablet Take 1 tablet (20 mg total) by mouth daily. 02/07/13  Yes Karren Cobble, MD  torsemide (DEMADEX) 10 MG tablet Take 10 mg by mouth daily.   Yes Historical Provider, MD  BD INSULIN SYRINGE ULTRAFINE 31G X 5/16" 0.3 ML MISC  11/19/13   Historical Provider, MD    Scheduled Meds: . amLODipine  10 mg Oral Daily  . atorvastatin  40 mg Oral q1800  . insulin aspart  0-9 Units Subcutaneous Q4H  . metoprolol tartrate  12.5 mg Oral BID  . multivitamin with minerals  1 tablet Oral Daily  . nitroGLYCERIN  0.5 inch Topical 4 times per day  . sodium chloride  3 mL Intravenous Q12H   Infusions: . dextrose 5 % and 0.9% NaCl 10 mL/hr (02/06/14 0601)  . pantoprozole (PROTONIX) infusion 8 mg/hr (02/06/14 0034)     PRN Meds: feeding supplement (GLUCERNA SHAKE), hydrALAZINE, metoprolol, nitroGLYCERIN, ondansetron (ZOFRAN) IV, ondansetron   Allergies as of 02/04/2014 - Review Complete 02/04/2014  Allergen Reaction Noted  . Baking soda-fluoride [sodium fluoride] Nausea And Vomiting 03/28/2011  . Magnesium hydroxide Nausea And Vomiting 08/28/2011    Family History  Problem Relation Age of Onset  . Diabetes insipidus Mother   . Hypertension Mother   . Hypertension Father   . Hypertension Sister   . Hypertension Child     +  Daughter with hx non-bleeding PUD       No colorectal cancers.   History   Social History  . Marital Status: Widowed    Spouse Name: N/A    Number of Children: N/A  . Years of Education: N/A   Occupational History  . Not on file.   Social History Main Topics  . Smoking status: Never Smoker   . Smokeless tobacco: Former Systems developer    Types: Snuff    Quit date: 10/13/2006     Comment: 02/26/12 "stopped snuff 4-6 years ago"  . Alcohol Use: No     Comment: "stopped drinking alcohol ~ 1980's"  . Drug Use: No  . Sexual Activity: No   Social History Narrative         Lives with son.  Family attentive.     REVIEW OF SYSTEMS: Constitutional:  Weakness in last several days ENT:  No nose bleeds Pulm:  No dyspnea or cough CV:  No palpitations, no LE edema.  GU:  No hematuria, no frequency GI:  No dysphagia.  Heme:  Per HPI   Transfusions:  Per HPI Neuro:  Walks with a walker post her CVA.  Recent falls at home.  No seizures.  Derm:  No itching, no rash or sores.  Endocrine:  No sweats  or chills.  No polyuria or dysuria Immunization:  Not queried Travel:  None   PHYSICAL EXAM: Vital signs in last 24 hours: Filed Vitals:   02/06/14 0425  BP: 157/38  Pulse: 56  Temp: 97.6 F (36.4 C)  Resp: 20   Wt Readings from Last 3 Encounters:  02/05/14 66 kg (145 lb 8.1 oz)  12/05/13 75.5 kg (166 lb 7.2 oz)  03/02/12 80.65 kg (177 lb 12.8 oz)    General: slumped  over in chair.  Refuses aspects of the exam. Head:  No asymmetry or swelling  Eyes:  Pt would not let me open her eyes nor would she do it herself. Ears:  reponds to her name and voice.   Nose:  No congestion, no sneezing Mouth:  Will not open her mouth, quick glance when she yawns, no blood or lesions. Neck:  No mass, no JVD Lungs:  Clear but diminished.  No cough or resting dyspnea. Heart: RRR.  No mrg Abdomen:  Soft, NT, no mass, no HSM.Marland Kitchen   Rectal: deferred.    Musc/Skeltl: no joint contractures or swelling Extremities:  slightl  Left pedal edema.  Neurologic:  Aphasic.  Skin:  No telangectasias or sores Tattoos:  None seen Nodes:  No cervical adenopathy   Psych:  Sleeping quietly. Agitated only when I attempted to open her eyes.   LAB RESULTS:  Recent Labs  02/04/14 1810 02/05/14 0655 02/05/14 1650 02/06/14 0510  WBC 7.6 9.2  --  8.9  HGB 5.6* 9.1* 8.5* 8.3*  HCT 18.6* 27.5* 25.6* 25.0*  PLT 230 145*  --  198   BMET Lab Results  Component Value Date   NA 137 02/06/2014   NA 133* 02/05/2014   NA 137 02/05/2014   K 3.5* 02/06/2014   K 4.8 02/05/2014   K 4.1 02/05/2014   CL 98 02/06/2014   CL 99 02/05/2014   CL 101 02/05/2014   CO2 24 02/06/2014   CO2 12* 02/05/2014   CO2 13* 02/05/2014   GLUCOSE 62* 02/06/2014   GLUCOSE 135* 02/05/2014   GLUCOSE 111* 02/05/2014   BUN 76* 02/06/2014   BUN 74* 02/05/2014   BUN 71* 02/05/2014   CREATININE 4.67* 02/06/2014   CREATININE 4.25* 02/05/2014   CREATININE 4.20* 02/05/2014   CALCIUM 8.2* 02/06/2014   CALCIUM 8.6 02/05/2014   CALCIUM 8.5 02/05/2014   LFT  Recent Labs  02/05/14 0655 02/06/14 0510  PROT 6.8 6.0  ALBUMIN 3.0* 2.7*  AST 97* 126*  ALT 53* 89*  ALKPHOS 93 95  BILITOT 1.2 1.1   PT/INR Lab Results  Component Value Date   INR 1.27 08/30/2011   INR 1.24 08/29/2011   INR 1.18 08/25/2011   Lipase     Component Value Date/Time   LIPASE 28 02/05/2014 1650    RADIOLOGY STUDIES: Ct Abdomen Pelvis Wo  Contrast 02/05/2014   COMPARISON:  None.  FINDINGS: Trace right-sided pleural fluid is noted. Minimal bibasilar atelectasis is seen. Diffuse coronary artery calcifications are noted.  Evaluation is mildly suboptimal due to motion artifact.  The liver and spleen are unremarkable in appearance. There is haziness about the gallbladder, which could reflect gallbladder wall thickening or could be artifactual in nature. Would correlate for associated symptoms. The pancreas and adrenal glands are unremarkable.  Nonspecific perinephric stranding is noted bilaterally. The kidneys are otherwise unremarkable in appearance. There is no evidence of hydronephrosis. No renal or ureteral stones are seen.  No free fluid is identified. The small bowel  is unremarkable in appearance. The stomach is within normal limits. No acute vascular abnormalities are seen. Diffuse calcification is seen along the abdominal aorta and its branches, including along the superior mesenteric artery.  The appendix is not definitely characterized; there is no evidence for appendicitis. The colon is unremarkable in appearance.  The bladder is decompressed with a Foley catheter in place. The patient is status post hysterectomy. No suspicious adnexal masses are seen. No inguinal lymphadenopathy is seen.  No acute osseous abnormalities are identified. There is near-complete loss of the joint space at the left hip, with diffuse subcortical cystic change and cortical irregularity. Facet disease is noted at the lower lumbar spine.  IMPRESSION: 1. Haziness about the gallbladder could reflect gallbladder wall thickening, or could be artifactual in nature. Evaluation is mildly suboptimal due to motion artifact. Would correlate for associated symptoms. 2. No evidence of hydronephrosis; kidneys grossly unremarkable in appearance. 3. Diffuse calcification along the abdominal aorta and its branches, including along the superior mesenteric artery. 4. Diffuse coronary  artery calcifications seen. 5. Trace right-sided pleural fluid noted. 6. Degenerative change noted at the left hip.   Electronically Signed   By: Garald Balding M.D.   On: 02/05/2014 03:17   Dg Chest 2 View 02/04/2014   COMPARISON:  DG CHEST 1V PORT dated 12/03/2013  FINDINGS: Moderate enlargement of cardiac silhouette. Vascular pattern is normal. No consolidation or effusion.  IMPRESSION: Moderate enlargement of the cardiac silhouette.  Lungs clear.   Electronically Signed   By: Skipper Cliche M.D.   On: 02/04/2014 12:18   Ct Head Wo Contrast 02/04/2014   COMPARISON:  None.  FINDINGS: No evidence of parenchymal hemorrhage or extra-axial fluid collection. No mass lesion, mass effect, or midline shift.  No CT evidence of acute infarction.  Subcortical white matter and periventricular small vessel ischemic changes. Intracranial atherosclerosis.  Age related atrophy.  No ventriculomegaly.  Partial opacification of the right frontal sinus. Visualized paranasal sinuses and mastoid air cells are otherwise clear.  No evidence of calvarial fracture.  IMPRESSION: No evidence of acute intracranial abnormality.  Age related atrophy with small vessel ischemic changes and intracranial atherosclerosis.   Electronically Signed   By: Julian Hy M.D.   On: 02/04/2014 12:44    ENDOSCOPIC STUDIES: EGD  10/2010  For melena  Moderate antral gastritis.    08/2011  Colonoscopy Normal.  Suspect bleeding came from gastric erosions in setting of supratherapeutic INR H pylori clotest negative.   2 D Echo  12/28/13 Study Conclusions - Left ventricle: Systolic function was mildly to moderately reduced. The estimated ejection fraction was in the range of 40% to 45%. Features are consistent with a pseudonormal left ventricular filling pattern, with concomitant abnormal relaxation and increased filling pressure (grade 2 diastolic dysfunction). - Aortic valve: Trivial regurgitation. - Mitral valve: Moderate  regurgitation directed centrally.   IMPRESSION:   *  Upper GIB, with melena and anemia.  Iron deficient with low MCV and low ferritin.  S/p transfusion 2 PRBCs with appropriate response. Suspect GI losses are only part of the issue, others being renal failure/anemia of chronic disease.  Previously had gastritis on 2012 EGD and no longer taking PPI. Recent anorexia may be indication of recurrent PUD. Hx of gastroparesis, though family relays no sxs referable to this Dx. There is no nuc med study in Epic.   *  Elevated transaminases with haziness surrounding GB on non-contrast CT.  No abd pain, tenderness or nausea, just anorexia.  *  AMS.  CT head without acute changes. MRI ordered. Hx embollic CVA [id number].  No longer takes Coumadin.   *  Non STEMI.  No plans for further studies.   *  ARF.  *  Nonischemic CHF, diastolic > systolic on [id number] Echo.   *  IDDM, not well controlled.    PLAN:     *  Per Dr Carlean Purl.who will see pt later today.   *  Will switch to BID IV Protonix.  Currently on a drip. RN feels pt safe with current D1 diet (per 4/26 bedside SLP swallow assessment) so long as supervised by staff so I will continue this.  *  MD ordered acute hepatitis panel and CMET for AM.   Vena Rua  02/06/2014, 11:02 AM Pager: [phone]  [facility] Attending  I have also seen and assessed the patient and agree with the above note.  Sounds like she has had UGI bleeding with hypoperfusion problems to end organs (brain, kidneys and heart at least). An EGD likely makes sense - it is not urgent. I have discussed w/ family and they are agreeable and will consent. Cannot do this tomorrow due to schedule and would like to see what tomorrow brings with overall status before proceeding. The risks and benefits as well as alternatives of endoscopic procedure(s) have been discussed and reviewed. All questions answered. The patient agrees to proceed.   [name], MD, [facility] [phone] (pager) 02/06/2014 5:43 PM

## 2014-02-06 NOTE — Progress Notes (Signed)
INITIAL NUTRITION ASSESSMENT  DOCUMENTATION CODES Per approved criteria  -Not Applicable   INTERVENTION: Provide Glucerna Shakes BID PRN Recommend Magic Cup with meals if PO intake is poor Provide Multivitamin with minerals daily  NUTRITION DIAGNOSIS: Predicted sub optimal energy intake related to AMS, Low Braden, and new onset dysphagia as evidenced by pt's chart and 13% weight loss in less than 3 months.   Goal: Pt to meet >/= 90% of their estimated nutrition needs   Monitor:  PO intake, diet advancement, weight trend, labs  Reason for Assessment: Low Braden Score  80 y.o. female  Admitting Dx: <principal problem not specified>  ASSESSMENT: 79 y.o. female, lives at home with her son with history of nonischemic cardiomyopathy, chronic combined systolic and diastolic CHF EF 42% this year, obstructive sleep apnea, hypertension, type 2 diabetes mellitus now insulin-dependent, diabetic gastroparesis, dyslipidemia, hypertension, CVA in the past, GI bleed in 2012 due to erosive gastritis, who was recently admitted for CHF and was discharged home on ACE inhibitor along with diuretic comes to the hospital after feeling weak for the last few weeks, few falls at home, at baseline she uses a walker and has early dementia. Since yesterday she has been increasingly confused and less responsive, EMS brought her to the ER.  Per cardiology note, pt's son reports that pt has been eating and drinking regularly. Pt NPO on admission, evaluated by SLP 4/26 and kept NPO, re-evaluated by SLP today and diet advanced to dysphagia 1 diet with thin liquids.  Weight history shows pt's weight has dropped 21 lbs in the past 2 months- 13% weight loss. Pt not alert or oriented at time of visit. Family at bedside at time of visit report pt has a good appetite and has been eating normally. They have noticed some weight loss which they think was due to poor po intake for a short period when pt had the flu. Per family,  pt has never had any chewing or swallowing problems before and she does not use nutritional supplements; pt does not like Ensure.   Height: Ht Readings from Last 1 Encounters:  02/05/14 5\' 2"  (1.575 m)    Weight: Wt Readings from Last 1 Encounters:  02/05/14 145 lb 8.1 oz (66 kg)    Ideal Body Weight: 110 lbs  % Ideal Body Weight: 132%  Wt Readings from Last 10 Encounters:  02/05/14 145 lb 8.1 oz (66 kg)  12/05/13 166 lb 7.2 oz (75.5 kg)  03/02/12 177 lb 12.8 oz (80.65 kg)  02/26/12 176 lb 1.6 oz (79.878 kg)  12/12/11 178 lb 1.6 oz (80.786 kg)  10/03/11 175 lb 9.6 oz (79.652 kg)  09/03/11 174 lb 8 oz (79.153 kg)  08/28/11 174 lb 6.1 oz (79.1 kg)  08/28/11 174 lb 6.1 oz (79.1 kg)  08/28/11 174 lb 6.1 oz (79.1 kg)    Usual Body Weight: unknown  % Usual Body Weight: NA  BMI:  Body mass index is 26.61 kg/(m^2).  Estimated Nutritional Needs: Kcal: 1600-1800 Protein: 65-75 grams Fluid: 1.6-1.8 L/day  Skin: WDL  Diet Order: Dysphagia 1, thin liquids  EDUCATION NEEDS: -No education needs identified at this time   Intake/Output Summary (Last 24 hours) at 02/06/14 1023 Last data filed at 02/06/14 0940  Gross per 24 hour  Intake   1800 ml  Output    575 ml  Net   1225 ml    Last BM: 4/27   Labs:   Recent Labs Lab 02/05/14 0115 02/05/14 6834 02/06/14 0510  NA 137 133* 137  K 4.1 4.8 3.5*  CL 101 99 98  CO2 13* 12* 24  BUN 71* 74* 76*  CREATININE 4.20* 4.25* 4.67*  CALCIUM 8.5 8.6 8.2*  GLUCOSE 111* 135* 62*    CBG (last 3)   Recent Labs  02/06/14 0433 02/06/14 0534 02/06/14 0731  GLUCAP 65* 141* 101*    Scheduled Meds: . amLODipine  10 mg Oral Daily  . atorvastatin  40 mg Oral q1800  . insulin aspart  0-9 Units Subcutaneous Q4H  . metoprolol tartrate  12.5 mg Oral BID  . nitroGLYCERIN  0.5 inch Topical 4 times per day  . sodium chloride  3 mL Intravenous Q12H    Continuous Infusions: . dextrose 5 % and 0.9% NaCl 10 mL/hr (02/06/14  0601)  . pantoprozole (PROTONIX) infusion 8 mg/hr (02/06/14 0034)    Past Medical History  Diagnosis Date  . Asthma   . CAD (coronary artery disease)     Pt reports MI in 2006 (no documentation).  Cardiolite in 05/2002 and 07/2006 did not reveal any reversible ischemia.  Pt follows with Dr. Rex [facility]ras at [facility].  . CHF (congestive heart failure)     EF 25-30% with dilated LV, mild LVH, severe hypokinesis, and mod-severe reduction in RV function  . Osteoporosis   . HYPERTENSION 08/03/2006  . GASTROPARESIS, DIABETIC 08/03/2006  . HYPERLIPIDEMIA 08/03/2006  . OBSTRUCTIVE SLEEP APNEA 01/06/2008  . PERIPHERAL NEUROPATHY 08/03/2006  . GERD 08/03/2006  . LOW BACK PAIN, CHRONIC 08/03/2006  . OSTEOPOROSIS 03/21/2009  . CEREBRAL EMBOLISM, WITH INFARCTION 07/02/2010  . Angina   . Myocardial infarction "2 or 3"  . Pneumonia 02/26/12    "a few times; probably even today"  . Shortness of breath     "all the time"  . DIABETES MELLITUS, TYPE II 11/04/1983  . Blood transfusion 08/2011  . Lower GI bleeding 08/2011 and 01/2014  . Chronic daily headache   . Migraines   . Stroke summer 2011    "made my left hip worse"  . Uterine cancer   . Nonischemic cardiomyopathy 05/2010    Left heart catheterization:2011. Nonobstructive coronary artery disease.  . Pulmonary hypertension   . Hypertension     Past Surgical History  Procedure Laterality Date  . Esophagogastroduodenoscopy  08/26/2011    Procedure: ESOPHAGOGASTRODUODENOSCOPY (EGD);  Surgeon: [name], MD;  Location: [facility] ENDOSCOPY;  Service: Endoscopy;  Laterality: N/A;  . Colonoscopy  08/28/2011    Procedure: COLONOSCOPY;  Surgeon: [name], MD;  Location: [place];  Service: Endoscopy;  Laterality: N/A;  . Vaginal hysterectomy    . Tubal ligation    . Cataract extraction w/ intraocular lens  implant, bilateral    . Toe surgery      "right big toe; operated on it to straighten it out; it was under"  . Polysomnogram  10/17/2005     AHI-7.28/hr. AHI REM-20.8/hr. Average oxygen saturation range during REM and NREM was 97%. Lowest oxygen saturation during REM sleep was 90%.  . Carotid duplex  05/28/2010    No significant extracranial carotid artery stenosis demonstrated. Vertebrals are patent w/ antegrade flow.  . Cardiac catheterization  05/24/2010    No intervention - recommed medical therapy.  . Cardiovascular stress test  08/07/2006    Moderate-severe defect seen in Basal inferior, Mid inferoseptal, Mid inferior, Mid inferolateral, and Apical inferior regions - consistent w/ infarct/scar. No scintigraphic evidence of inducible myocardial ischemia.  . Transthoracic echocardiogram  08/29/2011  EF 55-60%, moderate LVH,     Pryor Ochoa RD, LDN Inpatient Clinical Dietitian Pager: [phone] After Hours Pager: [phone]

## 2014-02-06 NOTE — Progress Notes (Signed)
Hypoglycemic Event  CBG: 65  Treatment: 25mg  d50  Symptoms: lethargic  Follow-up CBG: Time: CBG Result:144  Possible Reasons for Event: npo status  Comments/MD notified:    Tresa Endo  Remember to initiate Hypoglycemia Order Set & complete

## 2014-02-07 ENCOUNTER — Inpatient Hospital Stay (HOSPITAL_COMMUNITY): Payer: Medicare Other

## 2014-02-07 DIAGNOSIS — I6350 Cerebral infarction due to unspecified occlusion or stenosis of unspecified cerebral artery: Secondary | ICD-10-CM

## 2014-02-07 DIAGNOSIS — I499 Cardiac arrhythmia, unspecified: Secondary | ICD-10-CM

## 2014-02-07 DIAGNOSIS — I6389 Other cerebral infarction: Secondary | ICD-10-CM | POA: Diagnosis present

## 2014-02-07 LAB — GLUCOSE, CAPILLARY
GLUCOSE-CAPILLARY: 196 mg/dL — AB (ref 70–99)
Glucose-Capillary: 213 mg/dL — ABNORMAL HIGH (ref 70–99)
Glucose-Capillary: 255 mg/dL — ABNORMAL HIGH (ref 70–99)
Glucose-Capillary: 258 mg/dL — ABNORMAL HIGH (ref 70–99)
Glucose-Capillary: 302 mg/dL — ABNORMAL HIGH (ref 70–99)

## 2014-02-07 LAB — COMPREHENSIVE METABOLIC PANEL
ALT: 143 U/L — ABNORMAL HIGH (ref 0–35)
AST: 96 U/L — ABNORMAL HIGH (ref 0–37)
Albumin: 2.4 g/dL — ABNORMAL LOW (ref 3.5–5.2)
Alkaline Phosphatase: 86 U/L (ref 39–117)
BILIRUBIN TOTAL: 0.9 mg/dL (ref 0.3–1.2)
BUN: 74 mg/dL — AB (ref 6–23)
CHLORIDE: 104 meq/L (ref 96–112)
CO2: 20 mEq/L (ref 19–32)
Calcium: 8.2 mg/dL — ABNORMAL LOW (ref 8.4–10.5)
Creatinine, Ser: 4.67 mg/dL — ABNORMAL HIGH (ref 0.50–1.10)
GFR calc Af Amer: 9 mL/min — ABNORMAL LOW (ref >90)
GFR calc non Af Amer: 8 mL/min — ABNORMAL LOW (ref >90)
Glucose, Bld: 218 mg/dL — ABNORMAL HIGH (ref 70–99)
POTASSIUM: 3.6 meq/L — AB (ref 3.7–5.3)
SODIUM: 142 meq/L (ref 137–147)
Total Protein: 5.9 g/dL — ABNORMAL LOW (ref 6.0–8.3)

## 2014-02-07 LAB — CBC
HCT: 25.9 % — ABNORMAL LOW (ref 36.0–46.0)
Hemoglobin: 8.2 g/dL — ABNORMAL LOW (ref 12.0–15.0)
MCH: 23 pg — AB (ref 26.0–34.0)
MCHC: 31.7 g/dL (ref 30.0–36.0)
MCV: 72.5 fL — AB (ref 78.0–100.0)
PLATELETS: 202 10*3/uL (ref 150–400)
RBC: 3.57 MIL/uL — ABNORMAL LOW (ref 3.87–5.11)
RDW: 22.2 % — ABNORMAL HIGH (ref 11.5–15.5)
WBC: 8.7 10*3/uL (ref 4.0–10.5)

## 2014-02-07 LAB — HEPATITIS PANEL, ACUTE
HCV Ab: NEGATIVE
HEP A IGM: NONREACTIVE
Hep B C IgM: NONREACTIVE
Hepatitis B Surface Ag: NEGATIVE

## 2014-02-07 MED ORDER — PANTOPRAZOLE SODIUM 40 MG PO TBEC
40.0000 mg | DELAYED_RELEASE_TABLET | Freq: Every day | ORAL | Status: DC
  Administered 2014-02-07 – 2014-02-09 (×3): 40 mg via ORAL
  Filled 2014-02-07 (×2): qty 1

## 2014-02-07 MED ORDER — POTASSIUM CHLORIDE 10 MEQ/100ML IV SOLN
10.0000 meq | INTRAVENOUS | Status: AC
  Administered 2014-02-07 (×2): 10 meq via INTRAVENOUS
  Filled 2014-02-07 (×2): qty 100

## 2014-02-07 MED ORDER — SODIUM CHLORIDE 0.9 % IV SOLN: INTRAVENOUS | Status: DC

## 2014-02-07 NOTE — Progress Notes (Signed)
[facility] ROUNDING NOTE   Subjective:   Interval History: patient spoke today and able to be understood  Objective:  Vital signs in last 24 hours:  Temp:  [98.6 F (37 C)] 98.6 F (37 C) (04/28 0526) Pulse Rate:  [55-86] 62 (04/28 0526) Resp:  [18] 18 (04/28 0526) BP: (151-162)/(51-68) 155/68 mmHg (04/28 0526) SpO2:  [100 %] 100 % (04/28 0526) Weight:  [65.89 kg (145 lb 4.2 oz)] 65.89 kg (145 lb 4.2 oz) (04/28 0526)  Weight change:  Filed Weights   02/05/14 0530 02/07/14 0526  Weight: 66 kg (145 lb 8.1 oz) 65.89 kg (145 lb 4.2 oz)    Intake/Output: I/O last 3 completed shifts: In: 3015 [P.O.:240; I.V.:2775] Out: 875 [Urine:875]   Intake/Output this shift:     CVS- RRR RS- CTA ABD- BS present soft non-distended EXT- no edema   Basic Metabolic Panel:  Recent Labs Lab 02/04/14 1209 02/04/14 1552  02/05/14 0115 02/05/14 0655 02/06/14 0510 02/07/14 0535  NA 136*  --   --  137 133* 137 142  K 3.5*  --   --  4.1 4.8 3.5* 3.6*  CL 101  --   --  101 99 98 104  CO2  --   --   --  13* 12* 24 20  GLUCOSE 97  --   --  111* 135* 62* 218*  BUN 67*  --   --  71* 74* 76* 74*  CREATININE 4.20* 4.00*  --  4.20* 4.25* 4.67* 4.67*  CALCIUM  --   --   < > 8.5 8.6 8.2* 8.2*  < > = values in this interval not displayed.  Liver Function Tests:  Recent Labs Lab 02/05/14 0655 02/06/14 0510 02/07/14 0535  AST 97* 126* 96*  ALT 53* 89* 143*  ALKPHOS 93 95 86  BILITOT 1.2 1.1 0.9  PROT 6.8 6.0 5.9*  ALBUMIN 3.0* 2.7* 2.4*    Recent Labs Lab 02/05/14 1650  LIPASE 28    Recent Labs Lab 02/05/14 1650  AMMONIA 26    CBC:  Recent Labs Lab 02/04/14 1158  02/04/14 1810 02/05/14 0655 02/05/14 1650 02/06/14 0510 02/07/14 0535  WBC 9.1  --  7.6 9.2  --  8.9 8.7  NEUTROABS 6.3  --   --   --   --   --   --   HGB 6.2*  < > 5.6* 9.1* 8.5* 8.3* 8.2*  HCT 19.9*  < > 18.6* 27.5* 25.6* 25.0* 25.9*  MCV 65.0*  --  65.0* 70.5*  --  70.8* 72.5*  PLT  283  --  230 145*  --  198 202  < > = values in this interval not displayed.  Cardiac Enzymes:  Recent Labs Lab 02/04/14 1810 02/05/14 0115 02/05/14 0655 02/05/14 1650 02/05/14 2330  TROPONINI <0.30 0.58* 0.83* 1.83* 1.55*    BNP: No components found with this basename: POCBNP,   CBG:  Recent Labs Lab 02/06/14 1108 02/06/14 1600 02/06/14 2054 02/07/14 0036 02/07/14 0426  GLUCAP 120* 160* 199* 213* 196*    Microbiology: Results for orders placed during the hospital encounter of 02/04/14  MRSA PCR SCREENING     Status: None   Collection Time    02/05/14  8:03 AM      Result Value Ref Range Status   MRSA by PCR NEGATIVE  NEGATIVE Final   Comment:            The GeneXpert MRSA Assay (FDA  approved for NASAL specimens     only), is one component of a     comprehensive MRSA colonization     surveillance program. It is not     intended to diagnose MRSA     infection nor to guide or     monitor treatment for     MRSA infections.    Coagulation Studies: No results found for this basename: LABPROT, INR,  in the last 72 hours  Urinalysis:  Recent Labs  02/04/14 1203  COLORURINE YELLOW  LABSPEC 1.018  PHURINE 5.0  GLUCOSEU NEGATIVE  HGBUR NEGATIVE  BILIRUBINUR NEGATIVE  KETONESUR NEGATIVE  PROTEINUR 100*  UROBILINOGEN 0.2  NITRITE NEGATIVE  LEUKOCYTESUR NEGATIVE      Imaging: No results found.   Medications:   . dextrose 5 % and 0.9% NaCl 75 mL/hr (02/06/14 2129)   . amLODipine  10 mg Oral Daily  . atorvastatin  40 mg Oral q1800  . carvedilol  3.125 mg Oral BID WC  . insulin aspart  0-9 Units Subcutaneous Q4H  . multivitamin with minerals  1 tablet Oral Daily  . pantoprazole (PROTONIX) IV  40 mg Intravenous BID  . sodium chloride  3 mL Intravenous Q12H   feeding supplement (GLUCERNA SHAKE), hydrALAZINE, metoprolol, nitroGLYCERIN, ondansetron (ZOFRAN) IV, ondansetron  Assessment/ Plan:  The patient is a 80 y.o. year-old with hx of CAD,  DM2, HTN, NICM EF 25%, gastroparesis, HL, OSA, neuropathy, chronic LBP, hx CVA who presented to ED from home with AMS.   I agree with Dr Jonnie Finner and would not recommend dialysis  Acute on chronic renal failure in setting of ACE and diuretics and volume depletion  Creatinine is about same. Recovery may be very slow and possibly not at all. There is really very little to add unfortunately at this point. Palliative care could be very helpful establish some goals of care     LOS: [place] @TODAY @9 :53 AM

## 2014-02-07 NOTE — Clinical Social Work Placement (Addendum)
    Clinical Social Work Department CLINICAL SOCIAL WORK PLACEMENT NOTE 02/10/2014  Patient:  [name], [name]  Account Number:  [id number] Admit date:  02/04/2014  Clinical Social Worker:  Carrington Clamp, LCSWA  Date/time:  02/05/2014 03:15 PM  Clinical Social Work is seeking post-discharge placement for this patient at the following level of care:   [place]   (*CSW will update this form in Epic as items are completed)   02/05/2014  Patient/family provided with [place] Department of Clinical Social Work's list of facilities offering this level of care within the geographic area requested by the patient (or if unable, by the patient's family).  02/05/2014  Patient/family informed of their freedom to choose among providers that offer the needed level of care, that participate in Medicare, Medicaid or managed care program needed by the patient, have an available bed and are willing to accept the patient.  02/05/2014  Patient/family informed of [facility]' ownership interest in [facility], as well as of the fact that they are under no obligation to receive care at this facility.  PASARR submitted to EDS on 02/05/2014 PASARR number received from EDS on 02/05/2014  FL2 transmitted to all facilities in geographic area requested by pt/family on  02/05/2014 FL2 transmitted to all facilities within larger geographic area on   Patient informed that his/her managed care company has contracts with or will negotiate with  certain facilities, including the following:   NA     Patient/family informed of bed offers received:  02/07/2014 Patient chooses bed at [facility] Physician recommends and patient chooses bed at    Patient to be transferred to [facility] on  02/10/2014 Patient to be transferred to facility by Wheelchair/stretcher  The following physician request were entered in Epic:   Additional Comments: 02/10/14   Patient's family had accepted a bed at [facility] but prior to d/c CIR was recommended. CIR screened and accepted patient; ok per patient and daughters to go to CIR and they were extremely happy about this. RNCM is aware; nursing called report.  No further SW needs identified and CSW signing off. Lorie Phenix. [place], [place]

## 2014-02-07 NOTE — Evaluation (Signed)
Occupational Therapy Evaluation Patient Details Name: [name] MRN: [id number] DOB: 1933/05/03 Today's Date: 02/07/2014    History of Present Illness Metabolic encephalopathy with generalized weakness and multiple falls, likely due to combination of acute renal failure, NSTEMI, dehydration, and metabolic acidosis, possible lactic acidosis, GI bleed;  feeling weak for the last few weeks, few falls at home, at baseline she uses a walker and has early dementia.   Clinical Impression   Pt demonstrates decline in function with ADLs and ADL mobility safety with decreased strength, balance and endurance. Pt would benefit from acute OT services to address impairments to increase level of function and safety. Pt/family planning to d/c to SNF for rehab after acute care stay    Follow Up Recommendations  SNF;Supervision/Assistance - 24 hour    Equipment Recommendations  None recommended by OT;Other (comment) (TBD at next venue of care)    Recommendations for Other Services       Precautions / Restrictions Precautions Precautions: Fall Restrictions Weight Bearing Restrictions: No      Mobility Bed Mobility Overal bed mobility: Needs Assistance Bed Mobility: Supine to Sit;Sit to Supine     Supine to sit: Max assist Sit to supine: Max assist   General bed mobility comments: cues for sequencing. Assist wiht bringing LEs to/from EOB and to elevate trunk  Transfers Overall transfer level: Needs assistance   Transfers: Sit to/from Stand;Stand Pivot Transfers Sit to Stand: Max assist Stand pivot transfers: Max assist       General transfer comment: cues for sequencing    Balance Overall balance assessment: Needs assistance Sitting-balance support: Single extremity supported;No upper extremity supported;Feet supported Sitting balance-Leahy Scale: Fair     Standing balance support: Bilateral upper extremity supported;During functional activity Standing balance-Leahy Scale:  Poor                              ADL Overall ADL's : Needs assistance/impaired     Grooming: Wash/dry face;Wash/dry hands;Minimal assistance   Upper Body Bathing: Moderate assistance;Sitting   Lower Body Bathing: Total assistance   Upper Body Dressing : Moderate assistance;Sitting   Lower Body Dressing: Total assistance   Toilet Transfer: Maximal assistance;Cueing for safety;Cueing for sequencing;Stand-pivot   Toileting- Clothing Manipulation and Hygiene: Total assistance       Functional mobility during ADLs: Maximal assistance;Cueing for safety;Cueing for sequencing       Vision  wears glasses                              Pertinent Vitals/Pain C/o of R UE pain at IV site with edema noted, pts' nurse removed IV. VSS     Hand Dominance Right   Extremity/Trunk Assessment Upper Extremity Assessment Upper Extremity Assessment: Generalized weakness   Lower Extremity Assessment Lower Extremity Assessment: Defer to PT evaluation   Cervical / Trunk Assessment Cervical / Trunk Assessment: Normal   Communication Communication Communication: HOH   Cognition Arousal/Alertness: Awake/alert Behavior During Therapy: WFL for tasks assessed/performed Overall Cognitive Status: Impaired/Different from baseline Area of Impairment: Attention;Following commands;Problem solving       Following Commands: Follows one step commands inconsistently;Follows one step commands with increased time     Problem Solving: Slow processing;Decreased initiation;Requires verbal cues;Requires tactile cues     General Comments    Pt pleasant and cooperative  Home Living Family/patient expects to be discharged to:: Skilled nursing facility Living Arrangements: Children                                      Prior Functioning/Environment Level of Independence: Independent with assistive device(s)        Comments: was modified  independent wiht ADLs and with RW until recently, with falls leading up to this admission    OT Diagnosis: Generalized weakness   OT Problem List: Decreased strength;Decreased cognition;Decreased activity tolerance;Impaired balance (sitting and/or standing);Decreased safety awareness;Decreased knowledge of use of DME or AE   OT Treatment/Interventions: Self-care/ADL training;Therapeutic exercise;Patient/family education;Neuromuscular education;Balance training;Therapeutic activities;DME and/or AE instruction    OT Goals(Current goals can be found in the care plan section) Acute Rehab OT Goals Patient Stated Goal: none stated OT Goal Formulation: With patient/family Time For Goal Achievement: 02/14/14 Potential to Achieve Goals: Good ADL Goals Pt Will Perform Grooming: with min guard assist;with supervision;with set-up;sitting (EOB) Pt Will Perform Upper Body Bathing: with min assist;sitting (EOB) Pt Will Perform Upper Body Dressing: with min assist;sitting (EOB) Pt Will Transfer to Toilet: with mod assist;bedside commode Pt Will Perform Toileting - Clothing Manipulation and hygiene: with max assist;with mod assist;sitting/lateral leans;sit to/from stand Additional ADL Goal #1: Pt will complete bed mobility wiht mod A to sit EOB in prep for ADLs  OT Frequency: Min 2X/week   Barriers to D/C: Decreased caregiver support                        End of Session Equipment Utilized During Treatment: Gait belt  Activity Tolerance: Patient tolerated treatment well Patient left: in bed;with call bell/phone within reach;with bed alarm set;with family/visitor present   Time: 4696-2952 OT Time Calculation (min): 26 min Charges:  OT General Charges $OT Visit: 1 Procedure OT Evaluation $Initial OT Evaluation Tier I: 1 Procedure OT Treatments $Therapeutic Activity: 8-22 mins G-Codes:    Mosetta Putt Feb 11, 2014, 12:37 PM

## 2014-02-07 NOTE — Progress Notes (Signed)
Daily Rounding Note  02/07/2014, 8:29 AM  LOS: 3 days   SUBJECTIVE:       Agitated overnight.  More alert this AM Stools later yesterday and overnight were light brown and loose.   OBJECTIVE:         Vital signs in last 24 hours:    Temp:  [98.6 F (37 C)] 98.6 F (37 C) (04/28 0526) Pulse Rate:  [55-86] 62 (04/28 0526) Resp:  [18] 18 (04/28 0526) BP: (151-162)/(51-68) 155/68 mmHg (04/28 0526) SpO2:  [100 %] 100 % (04/28 0526) Weight:  [65.89 kg (145 lb 4.2 oz)] 65.89 kg (145 lb 4.2 oz) (04/28 0526) Last BM Date: 02/07/14 General: still aphasic  Update 7:18 PM speaking again [name], MD, [facility] Heart: RRR Chest: reduced BS.   Abdomen: soft, NT, ND.  Active BS  Extremities: no CCE Neuro/Psych:  Follows simple commands, aphasic (unable to tell me name of her daughter).   Lab Results:  Recent Labs  02/05/14 0655 02/05/14 1650 02/06/14 0510 02/07/14 0535  WBC 9.2  --  8.9 8.7  HGB 9.1* 8.5* 8.3* 8.2*  HCT 27.5* 25.6* 25.0* 25.9*  PLT 145*  --  198 202   BMET  Recent Labs  02/05/14 0655 02/06/14 0510 02/07/14 0535  NA 133* 137 142  K 4.8 3.5* 3.6*  CL 99 98 104  CO2 12* 24 20  GLUCOSE 135* 62* 218*  BUN 74* 76* 74*  CREATININE 4.25* 4.67* 4.67*  CALCIUM 8.6 8.2* 8.2*   LFT  Recent Labs  02/05/14 0655 02/06/14 0510 02/07/14 0535  PROT 6.8 6.0 5.9*  ALBUMIN 3.0* 2.7* 2.4*  AST 97* 126* 96*  ALT 53* 89* 143*  ALKPHOS 93 95 86  BILITOT 1.2 1.1 0.9    Scheduled Meds: . amLODipine  10 mg Oral Daily  . atorvastatin  40 mg Oral q1800  . carvedilol  3.125 mg Oral BID WC  . insulin aspart  0-9 Units Subcutaneous Q4H  . multivitamin with minerals  1 tablet Oral Daily  . pantoprazole (PROTONIX) IV  40 mg Intravenous BID  . potassium chloride  10 mEq Intravenous Q1 Hr x 2  . sodium chloride  3 mL Intravenous Q12H   Continuous Infusions: . dextrose 5 % and 0.9% NaCl 75 mL/hr (02/06/14  2129)   PRN Meds:.feeding supplement (GLUCERNA SHAKE), hydrALAZINE, metoprolol, nitroGLYCERIN, ondansetron (ZOFRAN) IV, ondansetron   ASSESMENT:   * Upper GIB, with melena and anemia. Iron deficient with low MCV and low ferritin. S/p transfusion 2 PRBCs with appropriate response. Suspect GI losses are only part of the issue, others being renal failure/anemia of chronic disease.  Previously had gastritis on 2012 EGD, no PPI at home. Recent anorexia. Now on IV BID Protonix.  Hx of gastroparesis, but no sxs referable to this and no nuc med study in Epic.  * Elevated transaminases with haziness surrounding GB on non-contrast CT. No abd pain, tenderness or nausea, just anorexia.  * AMS. CT head without acute changes. EEG with encephalopathy.  MRI prelim report is of a CVA.  Hx embollic CVA 4008. No longer takes Coumadin.  Remains aphasic,  Less obtunded and more alert this AM * Non STEMI. No plans for further studies.  * ARF. Stable elevation BUN/creat.  * Nonischemic CHF, diastolic > systolic on 03/7618 Echo.  * IDDM, not well controlled.     PLAN   *  Per Dr Carlean Purl. *  Switch  to po Protonix.  *  Await MRI reading.  *  Dysphagia 1 diet.      Vena Rua  02/07/2014, 8:29 AM Pager: [phone]  [facility] Attending  I have also seen and assessed the patient and agree with the above note. MRI shows watershed infarcts - hypotension from GI bleed likely cause. Since earlier visit speech has returned. Plan for EGD tomorrow PM to look for cause and guide antiPLT Tx, etc.Discussed this vs. Empiric Tx and no diagnostic evaluation - family and I lean toward EGD. The risks and benefits as well as alternatives of endoscopic procedure(s) have been discussed and reviewed. All questions answered. The patient/family agrees to proceed.  [name], MD, [facility] [phone] (pager) 02/07/2014 7:17 PM

## 2014-02-07 NOTE — Progress Notes (Signed)
[facility] PROGRESS NOTE  [name] [id number] DOB: [birth year] DOA: 02/04/2014 PCP: [name], MD  Brief Summary  [name] is a 80 y.o. female, lives at home with her son with history of nonischemic cardiomyopathy, chronic combined systolic and diastolic CHF EF [id number], obstructive sleep apnea, hypertension, type 2 diabetes mellitus now insulin-dependent, diabetic gastroparesis, dyslipidemia, hypertension, CVA in the past, GI bleed in 2012 due to erosive gastritis, who was recently admitted for CHF and was discharged home on ACE inhibitor along with diuretic presented with an weakness, increased falls, melena. Just prior to admission she developed expressive aphasia, twisted lip, and lethargy.  I suspect that she had some acute hypotensive event which may have been related to GI bleed which led to type II in standing, water shed infarcts, shock liver, and renal failure. Clinically, she has been improving.  Cardiology was consult for her in STEMI. Nephrology follows for her acute renal failure. Her kidney function appears to be stabilizing and she is not a candidate for hemodialysis. Neurology was consulted after the report from the MRI returned. Gastroenterology is anticipating performing upper endoscopy on Wednesday 4/29.  She should be discharged to skilled nursing facility with palliative care to follow on Wednesday afternoon or Thursday depending on progression.    ssessment/Plan  Acute stroke, possible watershed infarct vs. embolic strokes, speech and strength resolving.  EEG negative for sz.  ECHO and carotid duplex just completed a month ago.  I question whether she had some acute arrhythmia or other hypotensive event (such as GIB) which led to ATN, NSTEMI, watershed infarcts.   -  Ammonia level 26 -  UA neg -  CXR neg -  Head CT stable -  MRI:  Multiple small bilateral infarcts in white matter -  EEG:  Neg for epileptiform activity -  Neurology consult -  PT/OT/SLP:   Recommending SNF with restricted diet -  Not on antiplatelet medication secondary to acute GIB.  NSTEMI, likely type 2, with hx of known CAD, peak troponin 1.83 on 4/26. Given her comorbidities, particularly her renal failure, she is not a good candidate for left heart catheterization or invasive procedure. Sinus brady on telemetry. -  Per cardiology -  No ACE inhibitor secondary to acute renal failure -  Aspirin held because of concern for GIB -  Nitroglycerin when necessary  ICM with EF 40-45% and grade 2 DD, no rales on exam -  Monitor for SOB -  BB okay, but ACEI on hold due to AKI  HTN, blood pressure mildly elevated -  Continue BB, norvasc -   Per cardiology  Acute renal failure, baseline creatinine 1.09, current creatinine clearance <15 and creatinine plateauing. -  No hydronephrosis on CT abdomen pelvis  -  FENa 0.72% -  Given age and comorbidities, would likely not be a good candidate for hemodialysis -  Appreciate Nephrology assistance  Acute blood loss anemia, Hemoccult-positive, hgb drifting down.  Previous endoscopies for similar bleeding/anemia demonstrated erosive gastritis and normal colonoscopy.   -  Protonix per GI -  Possible endoscopy later this week  Abnl gallbladder with possible wall thickening on CT -  No abdominal pain on exam -  AST/ALT rising, bili wnl -  Lipase wnl  Mild transaminitis, likely shock liver from her acute event. -  Repeat LFTs in a week  +anion gap metabolic acidosis, may be secondary to elevated BUN.  May also have some underlying RTA from renal failure, resolved with sodium bicarb fluids -  lactic acid 1.5  DM2, A1c 11.5, hypoglycemic during initial day of hospitalization, now resolved.   -  Low dose SSI  Hyponatremia, resolved with IVF   OSA, stable.  Not on CPAP  Diet:  Dysphagia 1 with thin liquids Access:  PIV IVF:  yes Proph:  SCDs  Code Status: DNR Family Communication: patient and extended family Disposition Plan:   To skilled nursing facility in one to 2 days with palliative care to follow   Consultants:  Cardiology  Nephrology  Procedures:  CXR  CT head  CT abd/pelvis  Antibiotics:  none   HPI/Subjective:  Patient awake and speaking some.  Denies pain, SOB, nausea.  Able to follow some commands  Objective: Filed Vitals:   02/06/14 1130 02/06/14 1453 02/06/14 2118 02/07/14 0526  BP: 162/55 156/56 151/51 155/68  Pulse: 55 86 58 62  Temp:  98.6 F (37 C) 98.6 F (37 C) 98.6 F (37 C)  TempSrc:  Oral Oral Oral  Resp:  18 18 18   Height:      Weight:    65.89 kg (145 lb 4.2 oz)  SpO2:  100% 100% 100%    Intake/Output Summary (Last 24 hours) at 02/07/14 1139 Last data filed at 02/07/14 0900  Gross per 24 hour  Intake   1335 ml  Output    800 ml  Net    535 ml   Filed Weights   02/05/14 0530 02/07/14 0526  Weight: 66 kg (145 lb 8.1 oz) 65.89 kg (145 lb 4.2 oz)    Exam:   General:  African American female, NAD, slurred speech with some expressive aphasia  HEENT:  NCAT, MMM  Cardiovascular:  RRR, nl S1, S2 no mrg, 2+ pulses, warm extremities  Respiratory:  CTAB, no rales, no increased WOB  Abdomen:   NABS, soft, NT/ND  MSK:   Normal tone and bulk, no LEE  Neuro:  CN II-XII grossly intact, grossly moves all extremities, strength 5/5 upper extremities bilaterally.  Lower extremities seem to be 5/5, however, she had difficulty following commands to move her knees and legs.  Sensation intact to light touch.  No obvious neglect, unable to follow commands for neglect testing.    Data Reviewed: Basic Metabolic Panel:  Recent Labs Lab 02/04/14 1209 02/04/14 1552 02/05/14 0115 02/05/14 0655 02/06/14 0510 02/07/14 0535  NA 136*  --  137 133* 137 142  K 3.5*  --  4.1 4.8 3.5* 3.6*  CL 101  --  101 99 98 104  CO2  --   --  13* 12* 24 20  GLUCOSE 97  --  111* 135* 62* 218*  BUN 67*  --  71* 74* 76* 74*  CREATININE 4.20* 4.00* 4.20* 4.25* 4.67* 4.67*  CALCIUM  --    --  8.5 8.6 8.2* 8.2*   Liver Function Tests:  Recent Labs Lab 02/05/14 0655 02/06/14 0510 02/07/14 0535  AST 97* 126* 96*  ALT 53* 89* 143*  ALKPHOS 93 95 86  BILITOT 1.2 1.1 0.9  PROT 6.8 6.0 5.9*  ALBUMIN 3.0* 2.7* 2.4*    Recent Labs Lab 02/05/14 1650  LIPASE 28    Recent Labs Lab 02/05/14 1650  AMMONIA 26   CBC:  Recent Labs Lab 02/04/14 1158  02/04/14 1810 02/05/14 0655 02/05/14 1650 02/06/14 0510 02/07/14 0535  WBC 9.1  --  7.6 9.2  --  8.9 8.7  NEUTROABS 6.3  --   --   --   --   --   --  HGB 6.2*  < > 5.6* 9.1* 8.5* 8.3* 8.2*  HCT 19.9*  < > 18.6* 27.5* 25.6* 25.0* 25.9*  MCV 65.0*  --  65.0* 70.5*  --  70.8* 72.5*  PLT 283  --  230 145*  --  198 202  < > = values in this interval not displayed. Cardiac Enzymes:  Recent Labs Lab 02/04/14 1810 02/05/14 0115 02/05/14 0655 02/05/14 1650 02/05/14 2330  TROPONINI <0.30 0.58* 0.83* 1.83* 1.55*   BNP (last 3 results)  Recent Labs  12/03/13 1808 12/05/13 0448  PROBNP 7767.0* 6023.0*   CBG:  Recent Labs Lab 02/06/14 1600 02/06/14 2054 02/07/14 0036 02/07/14 0426 02/07/14 1056  GLUCAP 160* 199* 213* 196* 255*    Recent Results (from the past 240 hour(s))  MRSA PCR SCREENING     Status: None   Collection Time    02/05/14  8:03 AM      Result Value Ref Range Status   MRSA by PCR NEGATIVE  NEGATIVE Final   Comment:            The GeneXpert MRSA Assay (FDA     approved for NASAL specimens     only), is one component of a     comprehensive MRSA colonization     surveillance program. It is not     intended to diagnose MRSA     infection nor to guide or     monitor treatment for     MRSA infections.     Studies: Mr Herby Abraham Contrast  02/07/2014   CLINICAL DATA:  80 year old female with slurred speech. Initial encounter.  EXAM: MRI HEAD WITHOUT CONTRAST  TECHNIQUE: Multiplanar, multiecho pulse sequences of the brain and surrounding structures were obtained without intravenous  contrast.  COMPARISON:  Head CT without contrast 03/06/2014.  FINDINGS: The examination had to be discontinued prior to completion due to patient claustrophobia, the referring provider did not wish to utilized sedation or anxiolysis for the patient at this time.  Axial and coronal diffusion weighted imaging was obtained, and is motion degraded despite repeated imaging attempts.  Multiple small (up to 8 mm) foci of restricted diffusion in the bilateral cerebral white matter. Corona radiata have fully affected on the left. There may be some cortical involvement of the right cingulate it (series 3, image 16) cough otherwise cortex is spared. Bilateral hemisphere involvement. No involvement of the deep gray matter nuclei or posterior fossa identified.  No ventriculomegaly or intracranial mass effect.  No midline shift.  IMPRESSION: Truncated and motion degraded exam demonstrating multiple foci of abnormal signal primarily in the bilateral cerebral white matter.  Top differential considerations are acute watershed ischemia (such as due to recent hypotensive event) and sequelae of embolic event (although no posterior fossa involvement identified).  Study discussed by telephone with Dr. Noah Delaine Tayson Schnelle on 02/07/2014 at 1014 hours .   Electronically Signed   By: Lars Pinks M.D.   On: 02/07/2014 10:14    Scheduled Meds: . amLODipine  10 mg Oral Daily  . atorvastatin  40 mg Oral q1800  . carvedilol  3.125 mg Oral BID WC  . insulin aspart  0-9 Units Subcutaneous Q4H  . multivitamin with minerals  1 tablet Oral Daily  . pantoprazole  40 mg Oral Q0600  . sodium chloride  3 mL Intravenous Q12H   Continuous Infusions: . dextrose 5 % and 0.9% NaCl 75 mL/hr (02/06/14 2129)    Active Problems:   DM (diabetes mellitus), type 2, uncontrolled  GASTROPARESIS, DIABETIC   HYPERLIPIDEMIA   ANEMIA   OBSTRUCTIVE SLEEP APNEA   HYPERTENSION   CEREBRAL EMBOLISM, WITH INFARCTION   GERD   DEGENERATIVE JOINT DISEASE   CAD  (coronary artery disease)   GI bleed   ARF (acute renal failure)   Nonischemic cardiomyopathy   Chronic diastolic CHF EF 62%   Non-ST elevation MI (NSTEMI)   Renal failure, acute   NSTEMI (non-ST elevated myocardial infarction)    Time spent: 30 min    [facility] Pager [phone]. If 7PM-7AM, please contact night-coverage at [email or web address], password [facility] 02/07/2014, 11:39 AM  LOS: 3 days

## 2014-02-07 NOTE — Consult Note (Signed)
Referring Physician: Short    Chief Complaint: Watershed infarct.   HPI:                                                                                                                                         [name] is an 80 y.o. female presented with an weakness, increased falls, melena.  Just prior to admission she developed expressive aphasia, twisted lip, and lethargy. While in hospital she was found to have acute blood loss anemia, Hemoccult-positive, hgb drifting down. Previous endoscopies for similar bleeding/anemia demonstrated erosive gastritis.  MRI was obtained showing  multiple foci of abnormal signal primarily in the bilateral cerebral white matter consistent with watershed infarct. Currently she is not on antiplatelet due to GI bleed and scheduled for EGD tomorrow.   Neuro work up while in hospital showed normal EEG, recent carotid doppler 12/28/13 showing no ICA stenosis and Echo on 12/28/13 showing EF 40-45% with no mention of PFO.   Of note--family is going to have a palliative care meeting     Date last known well: Unable to determine Time last known well: Unable to determine tPA Given: No: unable to determine LSN  Past Medical History  Diagnosis Date  . Asthma   . CAD (coronary artery disease)     Pt reports MI in 2006 (no documentation).  Cardiolite in 05/2002 and 07/2006 did not reveal any reversible ischemia.  Pt follows with Dr. Rex Kras at [facility]  . CHF (congestive heart failure)     EF 25-30% with dilated LV, mild LVH, severe hypokinesis, and mod-severe reduction in RV function  . Osteoporosis   . HYPERTENSION 08/03/2006  . GASTROPARESIS, DIABETIC 08/03/2006  . HYPERLIPIDEMIA 08/03/2006  . OBSTRUCTIVE SLEEP APNEA 01/06/2008  . PERIPHERAL NEUROPATHY 08/03/2006  . GERD 08/03/2006  . LOW BACK PAIN, CHRONIC 08/03/2006  . OSTEOPOROSIS 03/21/2009  . CEREBRAL EMBOLISM, WITH INFARCTION 07/02/2010  . Angina   . Myocardial infarction "2 or 3"  . Pneumonia 02/26/12     "a few times; probably even today"  . Shortness of breath     "all the time"  . DIABETES MELLITUS, TYPE II 11/04/1983  . Blood transfusion 08/2011  . Lower GI bleeding 08/2011 and 01/2014  . Chronic daily headache   . Migraines   . Stroke summer 2011    "made my left hip worse"  . Uterine cancer   . Nonischemic cardiomyopathy 05/2010    Left heart catheterization:2011. Nonobstructive coronary artery disease.  . Pulmonary hypertension   . Hypertension     Past Surgical History  Procedure Laterality Date  . Esophagogastroduodenoscopy  08/26/2011    Procedure: ESOPHAGOGASTRODUODENOSCOPY (EGD);  Surgeon: [name], MD;  Location: [facility] ENDOSCOPY;  Service: Endoscopy;  Laterality: N/A;  . Colonoscopy  08/28/2011    Procedure: COLONOSCOPY;  Surgeon: [name], MD;  Location: [place];  Service: Endoscopy;  Laterality:  N/A;  . Vaginal hysterectomy    . Tubal ligation    . Cataract extraction w/ intraocular lens  implant, bilateral    . Toe surgery      "right big toe; operated on it to straighten it out; it was under"  . Polysomnogram  10/17/2005    AHI-7.28/hr. AHI REM-20.8/hr. Average oxygen saturation range during REM and NREM was 97%. Lowest oxygen saturation during REM sleep was 90%.  . Carotid duplex  05/28/2010    No significant extracranial carotid artery stenosis demonstrated. Vertebrals are patent w/ antegrade flow.  . Cardiac catheterization  05/24/2010    No intervention - recommed medical therapy.  . Cardiovascular stress test  08/07/2006    Moderate-severe defect seen in Basal inferior, Mid inferoseptal, Mid inferior, Mid inferolateral, and Apical inferior regions - consistent w/ infarct/scar. No scintigraphic evidence of inducible myocardial ischemia.  . Transthoracic echocardiogram  08/29/2011    EF 55-60%, moderate LVH,     Family History  Problem Relation Age of Onset  . Diabetes insipidus Mother   . Hypertension Mother   . Hypertension Father   .  Hypertension Sister   . Hypertension Child    Social History:  reports that she has never smoked. She quit smokeless tobacco use about 7 years ago. Her smokeless tobacco use included Snuff. She reports that she does not drink alcohol or use illicit drugs.  Allergies:  Allergies  Allergen Reactions  . Baking Soda-Fluoride [Sodium Fluoride] Nausea And Vomiting  . Magnesium Hydroxide Nausea And Vomiting    Medications:                                                                                                                           Scheduled: . amLODipine  10 mg Oral Daily  . atorvastatin  40 mg Oral q1800  . carvedilol  3.125 mg Oral BID WC  . insulin aspart  0-9 Units Subcutaneous Q4H  . multivitamin with minerals  1 tablet Oral Daily  . pantoprazole  40 mg Oral Q0600    ROS:                                                                                                                                       History obtained from family  General ROS: negative for - chills, fatigue, fever, night sweats, weight gain or weight loss Psychological  ROS: negative for - behavioral disorder, hallucinations, memory difficulties, mood swings or suicidal ideation Ophthalmic ROS: negative for - blurry vision, double vision, eye pain or loss of vision ENT ROS: negative for - epistaxis, nasal discharge, oral lesions, sore throat, tinnitus or vertigo Allergy and Immunology ROS: negative for - hives or itchy/watery eyes Hematological and Lymphatic ROS: negative for - bleeding problems, bruising or swollen lymph nodes Endocrine ROS: negative for - galactorrhea, hair pattern changes, polydipsia/polyuria or temperature intolerance Respiratory ROS: negative for - cough, hemoptysis, shortness of breath or wheezing Cardiovascular ROS: negative for - chest pain, dyspnea on exertion, edema or irregular heartbeat Gastrointestinal ROS: negative for - abdominal pain, diarrhea, hematemesis,  nausea/vomiting or stool incontinence Genito-Urinary ROS: negative for - dysuria, hematuria, incontinence or urinary frequency/urgency Musculoskeletal ROS: negative for - joint swelling or muscular weakness Neurological ROS: as noted in HPI Dermatological ROS: negative for rash and skin lesion changes  Neurologic Examination:                                                                                                      Blood pressure 155/68, pulse 62, temperature 98.6 F (37 C), temperature source Oral, resp. rate 18, height 5\' 2"  (1.575 m), weight 65.89 kg (145 lb 4.2 oz), last menstrual period 12/19/1968, SpO2 100.00%.   Mental Status: Alert, not oriented, able to name one object but then perseverates on that object, unable to follow verbal or visual commands.   Speech dysarthric with evidence of expressive and receptive aphasia.   Cranial Nerves: II: Discs flat bilaterally; blinks to threat bilaterally, pupils equal, round, reactive to light and accommodation III,IV, VI: ptosis not present, extra-ocular motions intact bilaterally V,VII: smile asymmetric on the right, facial light touch sensation normal bilaterally VIII: hearing normal bilaterally IX,X: gag reflex present XI: bilateral shoulder shrug XII: midline tongue extension without atrophy or fasciculations  Motor: Able to lift the left arm antigravity with 3/5 strength, does not lift right arm antigravity, lifts bilateral legs antigravity.  Tends to localize pain with the left arm greater than the right.  Sensory: Pinprick and light touch intact throughout, bilaterally Deep Tendon Reflexes:  1+ bilateral UE and no AJ or KJ  Plantars: Mute bilaterally Cerebellar: normal finger-to-nose on the left,  Unable to assess heel-to-shin test Gait: unable to assess.  CV: pulses palpable throughout    Lab Results: Basic Metabolic Panel:  Recent Labs Lab 02/04/14 1209 02/04/14 1552  02/05/14 0115 02/05/14 0655  02/06/14 0510 02/07/14 0535  NA 136*  --   --  137 133* 137 142  K 3.5*  --   --  4.1 4.8 3.5* 3.6*  CL 101  --   --  101 99 98 104  CO2  --   --   --  13* 12* 24 20  GLUCOSE 97  --   --  111* 135* 62* 218*  BUN 67*  --   --  71* 74* 76* 74*  CREATININE 4.20* 4.00*  --  4.20* 4.25* 4.67* 4.67*  CALCIUM  --   --   < >  8.5 8.6 8.2* 8.2*  < > = values in this interval not displayed.  Liver Function Tests:  Recent Labs Lab 02/05/14 0655 02/06/14 0510 02/07/14 0535  AST 97* 126* 96*  ALT 53* 89* 143*  ALKPHOS 93 95 86  BILITOT 1.2 1.1 0.9  PROT 6.8 6.0 5.9*  ALBUMIN 3.0* 2.7* 2.4*    Recent Labs Lab 02/05/14 1650  LIPASE 28    Recent Labs Lab 02/05/14 1650  AMMONIA 26    CBC:  Recent Labs Lab 02/04/14 1158  02/04/14 1810 02/05/14 0655 02/05/14 1650 02/06/14 0510 02/07/14 0535  WBC 9.1  --  7.6 9.2  --  8.9 8.7  NEUTROABS 6.3  --   --   --   --   --   --   HGB 6.2*  < > 5.6* 9.1* 8.5* 8.3* 8.2*  HCT 19.9*  < > 18.6* 27.5* 25.6* 25.0* 25.9*  MCV 65.0*  --  65.0* 70.5*  --  70.8* 72.5*  PLT 283  --  230 145*  --  198 202  < > = values in this interval not displayed.  Cardiac Enzymes:  Recent Labs Lab 02/04/14 1810 02/05/14 0115 02/05/14 0655 02/05/14 1650 02/05/14 2330  TROPONINI <0.30 0.58* 0.83* 1.83* 1.55*    Lipid Panel: No results found for this basename: CHOL, TRIG, HDL, CHOLHDL, VLDL, LDLCALC,  in the last 168 hours  CBG:  Recent Labs Lab 02/06/14 1600 02/06/14 2054 02/07/14 0036 02/07/14 0426 02/07/14 1056  GLUCAP 160* 199* 213* 196* 255*    Microbiology: Results for orders placed during the hospital encounter of 02/04/14  MRSA PCR SCREENING     Status: None   Collection Time    02/05/14  8:03 AM      Result Value Ref Range Status   MRSA by PCR NEGATIVE  NEGATIVE Final   Comment:            The GeneXpert MRSA Assay (FDA     approved for NASAL specimens     only), is one component of a     comprehensive MRSA colonization      surveillance program. It is not     intended to diagnose MRSA     infection nor to guide or     monitor treatment for     MRSA infections.    Coagulation Studies: No results found for this basename: LABPROT, INR,  in the last 72 hours  Imaging: Mr Brain Wo Contrast  02/07/2014   CLINICAL DATA:  80 year old female with slurred speech. Initial encounter.  EXAM: MRI HEAD WITHOUT CONTRAST  TECHNIQUE: Multiplanar, multiecho pulse sequences of the brain and surrounding structures were obtained without intravenous contrast.  COMPARISON:  Head CT without contrast 03/06/2014.  FINDINGS: The examination had to be discontinued prior to completion due to patient claustrophobia, the referring provider did not wish to utilized sedation or anxiolysis for the patient at this time.  Axial and coronal diffusion weighted imaging was obtained, and is motion degraded despite repeated imaging attempts.  Multiple small (up to 8 mm) foci of restricted diffusion in the bilateral cerebral white matter. Corona radiata have fully affected on the left. There may be some cortical involvement of the right cingulate it (series 3, image 16) cough otherwise cortex is spared. Bilateral hemisphere involvement. No involvement of the deep gray matter nuclei or posterior fossa identified.  No ventriculomegaly or intracranial mass effect.  No midline shift.  IMPRESSION: Truncated and motion degraded exam demonstrating multiple  foci of abnormal signal primarily in the bilateral cerebral white matter.  Top differential considerations are acute watershed ischemia (such as due to recent hypotensive event) and sequelae of embolic event (although no posterior fossa involvement identified).  Study discussed by telephone with Dr. Noah Delaine SHORT on 02/07/2014 at 1014 hours .   Electronically Signed   By: Lars Pinks M.D.   On: 02/07/2014 10:14       Assessment and plan discussed with with attending physician and they are in agreement.    Etta Quill PA-C [facility] [phone]  02/07/2014, 2:49 PM   Assessment: 80 y.o. female with bilateral watershed infarcts in the setting of acute GIB and EF of 40-45%. Likely secondary to hypotension in the setting of blood loss. At his time family is considering palliative care. As patient has a GIB she is not able to be on ASA or AC. Would not pursue any further stroke work up.  Once GI bleed has resolved consider ASA if deemed ok by GI.   Stroke Risk Factors - diabetes mellitus, hyperlipidemia and hypertension     Patient seen and examined together with physician assistant and I concur with the assessment and plan.  Dorian Pod, MD

## 2014-02-07 NOTE — Evaluation (Signed)
Speech Language Pathology Evaluation Patient Details Name: [name] MRN: [id number] DOB: 09/13/1933 Today's Date: 02/07/2014 Time: 0277-4128 SLP Time Calculation (min): 24 min  Problem List:  Patient Active Problem List   Diagnosis Date Noted  . Acute bilat watershed infarction vs. embolic strokes 78/67/6720  . Chronic diastolic CHF EF 94% 70/96/2836  . Non-ST elevation MI (NSTEMI) 02/04/2014  . Renal failure, acute 02/04/2014  . NSTEMI (non-ST elevated myocardial infarction) 02/04/2014  . Nonischemic cardiomyopathy 12/12/2013  . ARF (acute renal failure) 12/05/2013  . Memory difficulties 12/04/2013  . Pulmonary edema 12/03/2013  . Acute diastolic CHF (congestive heart failure) 12/03/2013  . URI (upper respiratory infection) 03/02/2012  . Irregular heart rhythm 02/26/2012  . Ventricular bigeminy 02/26/2012  . GI bleed 08/24/2011  . CAD (coronary artery disease)   . CEREBRAL EMBOLISM, WITH INFARCTION 07/02/2010  . OSTEOPOROSIS 03/21/2009  . DEGENERATIVE JOINT DISEASE 07/24/2008  . INSOMNIA-SLEEP DISORDER-UNSPEC 07/24/2008  . ANEMIA 01/06/2008  . OBSTRUCTIVE SLEEP APNEA 01/06/2008  . GASTROPARESIS, DIABETIC 08/03/2006  . HYPERLIPIDEMIA 08/03/2006  . PERIPHERAL NEUROPATHY 08/03/2006  . HYPERTENSION 08/03/2006  . Chronic diastolic congestive heart failure 08/03/2006  . REACTIVE AIRWAY DISEASE 08/03/2006  . GERD 08/03/2006  . DM (diabetes mellitus), type 2, uncontrolled 11/04/1983   Past Medical History:  Past Medical History  Diagnosis Date  . Asthma   . CAD (coronary artery disease)     Pt reports MI in 2006 (no documentation).  Cardiolite in 05/2002 and 07/2006 did not reveal any reversible ischemia.  Pt follows with Dr. Rex Kras at [facility].  . CHF (congestive heart failure)     EF 25-30% with dilated LV, mild LVH, severe hypokinesis, and mod-severe reduction in RV function  . Osteoporosis   . HYPERTENSION 08/03/2006  . GASTROPARESIS, DIABETIC 08/03/2006  .  HYPERLIPIDEMIA 08/03/2006  . OBSTRUCTIVE SLEEP APNEA 01/06/2008  . PERIPHERAL NEUROPATHY 08/03/2006  . GERD 08/03/2006  . LOW BACK PAIN, CHRONIC 08/03/2006  . OSTEOPOROSIS 03/21/2009  . CEREBRAL EMBOLISM, WITH INFARCTION 07/02/2010  . Angina   . Myocardial infarction "2 or 3"  . Pneumonia 02/26/12    "a few times; probably even today"  . Shortness of breath     "all the time"  . DIABETES MELLITUS, TYPE II 11/04/1983  . Blood transfusion 08/2011  . Lower GI bleeding 08/2011 and 01/2014  . Chronic daily headache   . Migraines   . Stroke summer 2011    "made my left hip worse"  . Uterine cancer   . Nonischemic cardiomyopathy 05/2010    Left heart catheterization:2011. Nonobstructive coronary artery disease.  . Pulmonary hypertension   . Hypertension    Past Surgical History:  Past Surgical History  Procedure Laterality Date  . Esophagogastroduodenoscopy  08/26/2011    Procedure: ESOPHAGOGASTRODUODENOSCOPY (EGD);  Surgeon: [name], MD;  Location: [facility] ENDOSCOPY;  Service: Endoscopy;  Laterality: N/A;  . Colonoscopy  08/28/2011    Procedure: COLONOSCOPY;  Surgeon: [name], MD;  Location: [place];  Service: Endoscopy;  Laterality: N/A;  . Vaginal hysterectomy    . Tubal ligation    . Cataract extraction w/ intraocular lens  implant, bilateral    . Toe surgery      "right big toe; operated on it to straighten it out; it was under"  . Polysomnogram  10/17/2005    AHI-7.28/hr. AHI REM-20.8/hr. Average oxygen saturation range during REM and NREM was 97%. Lowest oxygen saturation during REM sleep was 90%.  . Carotid duplex  05/28/2010  No significant extracranial carotid artery stenosis demonstrated. Vertebrals are patent w/ antegrade flow.  . Cardiac catheterization  05/24/2010    No intervention - recommed medical therapy.  . Cardiovascular stress test  08/07/2006    Moderate-severe defect seen in Basal inferior, Mid inferoseptal, Mid inferior, Mid inferolateral, and Apical  inferior regions - consistent w/ infarct/scar. No scintigraphic evidence of inducible myocardial ischemia.  . Transthoracic echocardiogram  08/29/2011    EF 55-60%, moderate LVH,    HPI:  [name] is a 80 y.o. female, lives at home with her son with h/o nonischemic cardiomyopathy, chronic combined systolic and diastolic CHF, OSA, hypertension, type 2 diabetes mellitus, diabetic gastroparesis, dyslipidemia, hypertension, CVA in the past, GI bleed in 2012 due to erosive gastritis, who was recently admitted for CHF comes back to the hospital after feeling weak for the last few weeks, few falls at home, at baseline she uses a walker and has early dementia. In the ER she was found to be obtunded, workup was suggestive of dehydration with acute renal failure, CT head chest x-ray was unremarkable.   Assessment / Plan / Recommendation Clinical Impression  Pt demonstrates right sided neglect, moderate dysarthria, decreased initiation, impaired attention and recpetive langauge deficits necesitating multiple repetitions for pt to participate, follow commands/respond. She was able to count to ten and name 3/5 familiar objects. She is reluctant to respond verbally but will nod/shake head consistently. Possible aphasia. Pt will need continued SLP therapy to address function communication and participation in ADLs. Recommend CIR consult.     SLP Assessment  Patient needs continued Speech Lanaguage Pathology Services    Follow Up Recommendations  Inpatient Rehab    Frequency and Duration min 2x/week  2 weeks   Pertinent Vitals/Pain NA   SLP Goals  SLP Goals Potential to Achieve Goals: Fair  SLP Evaluation Prior Functioning  Cognitive/Linguistic Baseline: Baseline deficits Baseline deficit details: early dementia  Lives With: Son Available Help at Discharge: Family   Cognition  Overall Cognitive Status: Impaired/Different from baseline Arousal/Alertness: Awake/alert Attention:  Focused;Sustained Focused Attention: Impaired Focused Attention Impairment: Functional basic;Verbal basic Sustained Attention: Impaired Sustained Attention Impairment: Verbal basic;Functional basic Memory:  (NT) Awareness: Impaired Awareness Impairment: Intellectual impairment;Emergent impairment Problem Solving: Impaired Problem Solving Impairment: Verbal basic;Functional basic Behaviors: Perseveration Safety/Judgment: Impaired    Comprehension  Auditory Comprehension Overall Auditory Comprehension: Impaired Yes/No Questions: Impaired Basic Biographical Questions: 51-75% accurate Commands: Impaired One Step Basic Commands: 50-74% accurate Conversation: Simple Interfering Components: Attention Visual Recognition/Discrimination Discrimination: Not tested Reading Comprehension Reading Status: Not tested    Expression Verbal Expression Overall Verbal Expression: Impaired Initiation: Impaired Automatic Speech: Name;Counting;Social Response Level of Generative/Spontaneous Verbalization: Word Repetition:  (NT) Naming: Impairment Responsive: Not tested Confrontation: Impaired Convergent: Not tested Divergent: Not tested Verbal Errors: Perseveration Pragmatics: Impairment Impairments: Eye contact Interfering Components: Attention;Speech intelligibility Written Expression Dominant Hand: Right Written Expression: Not tested   Oral / Motor Oral Motor/Sensory Function Overall Oral Motor/Sensory Function: Impaired Labial ROM: Reduced right Labial Symmetry: Abnormal symmetry right Labial Strength: Reduced Labial Sensation: Reduced Lingual ROM: Within Functional Limits Lingual Symmetry: Within Functional Limits Lingual Strength: Reduced Facial Symmetry: Within Functional Limits Velum: Within Functional Limits Mandible: Within Functional Limits Motor Speech Overall Motor Speech: Impaired Respiration: Within functional limits Phonation: Normal Resonance: Within functional  limits Articulation: Impaired Level of Impairment: Word Intelligibility: Intelligibility reduced Word: 25-49% accurate Phrase: Not tested Sentence: Not tested Conversation: Not tested Motor Planning: Witnin functional limits Motor Speech Errors: Consistent   GO  Herbie [place], [place] CCC-SLP [place] Hannah Strader 02/07/2014, 3:15 PM

## 2014-02-07 NOTE — Progress Notes (Signed)
Patient Name: [name] Date of Encounter: 02/07/2014  Active Problems:   DM (diabetes mellitus), type 2, uncontrolled   GASTROPARESIS, DIABETIC   HYPERLIPIDEMIA   ANEMIA   OBSTRUCTIVE SLEEP APNEA   HYPERTENSION   CEREBRAL EMBOLISM, WITH INFARCTION   GERD   DEGENERATIVE JOINT DISEASE   CAD (coronary artery disease)   GI bleed   ARF (acute renal failure)   Nonischemic cardiomyopathy   Chronic diastolic CHF EF 60%   Non-ST elevation MI (NSTEMI)   Renal failure, acute   NSTEMI (non-ST elevated myocardial infarction)    Patient Profile: 80 yo female w/ NICM, chronic combined systolic and diastolic CHF EF 73% 71/0626, OSA, HTN, type 2 DM now insulin-dependent, gastroparesis, dyslipidemia, remote CVA, GIB 2012 w/ erosive gastritis, admitted 02/21-02/23 w/ CHF and d/c on ACE inhibitor plus diuretic was admitted 04/25 w/ weakness, few falls at home. Cardiology consult 04/26 for elevated troponin   SUBJECTIVE: Pt responds to her name, took a deep breath as requested, spoke 4 words then quit answering questions   OBJECTIVE Filed Vitals:   02/06/14 1130 02/06/14 1453 02/06/14 2118 02/07/14 0526  BP: 162/55 156/56 151/51 155/68  Pulse: 55 86 58 62  Temp:  98.6 F (37 C) 98.6 F (37 C) 98.6 F (37 C)  TempSrc:  Oral Oral Oral  Resp:  18 18 18   Height:      Weight:    145 lb 4.2 oz (65.89 kg)  SpO2:  100% 100% 100%    Intake/Output Summary (Last 24 hours) at 02/07/14 0940 Last data filed at 02/07/14 0700  Gross per 24 hour  Intake   1215 ml  Output    600 ml  Net    615 ml   Filed Weights   02/05/14 0530 02/07/14 0526  Weight: 145 lb 8.1 oz (66 kg) 145 lb 4.2 oz (65.89 kg)    PHYSICAL EXAM General: Well developed, well nourished, female in no acute distress. Head: Normocephalic, atraumatic.  Neck: Supple without bruits, JVD not elevated. Lungs:  Resp regular and unlabored, CTA. Heart: RRR, S1, S2, no S3, S4, or murmur; no rub. Abdomen: Soft, non-tender,  non-distended, BS + x 4.  Extremities: No clubbing, cyanosis, edema.  Neuro: Alert and oriented X 1. Moves all extremities spontaneously. Psych: Normal affect.  LABS: CBC: Recent Labs  02/04/14 1158  02/06/14 0510 02/07/14 0535  WBC 9.1  < > 8.9 8.7  NEUTROABS 6.3  --   --   --   HGB 6.2*  < > 8.3* 8.2*  HCT 19.9*  < > 25.0* 25.9*  MCV 65.0*  < > 70.8* 72.5*  PLT 283  < > 198 202  < > = values in this interval not displayed.  Basic Metabolic Panel:  Recent Labs  02/06/14 0510 02/07/14 0535  NA 137 142  K 3.5* 3.6*  CL 98 104  CO2 24 20  GLUCOSE 62* 218*  BUN 76* 74*  CREATININE 4.67* 4.67*  CALCIUM 8.2* 8.2*   Liver Function Tests:  Recent Labs  02/06/14 0510 02/07/14 0535  AST 126* 96*  ALT 89* 143*  ALKPHOS 95 86  BILITOT 1.1 0.9  PROT 6.0 5.9*  ALBUMIN 2.7* 2.4*   Cardiac Enzymes:  Recent Labs  02/05/14 0655 02/05/14 1650 02/05/14 2330  TROPONINI 0.83* 1.83* 1.55*    Recent Labs  02/04/14 1207  TROPIPOC 0.12*   BNP: Pro B Natriuretic peptide (BNP)  Date/Time Value Ref Range Status  12/05/2013  4:48 AM 6023.0* 0 - 450 pg/mL Final  12/03/2013  6:08 PM 7767.0* 0 - 450 pg/mL Final   Hemoglobin A1C:  Recent Labs  02/04/14 1552  HGBA1C 11.5*   Anemia Panel:  Recent Labs  02/04/14 1404  VITAMINB12 684  FOLATE >20.0  FERRITIN 8*  TIBC 449  IRON 14*  RETICCTPCT 1.3    TELE: SR, Sbrady, generally 50s, will drop into high 40s, no discernable symptoms  Current Medications:  . amLODipine  10 mg Oral Daily  . atorvastatin  40 mg Oral q1800  . carvedilol  3.125 mg Oral BID WC  . insulin aspart  0-9 Units Subcutaneous Q4H  . multivitamin with minerals  1 tablet Oral Daily  . pantoprazole (PROTONIX) IV  40 mg Intravenous BID  . potassium chloride  10 mEq Intravenous Q1 Hr x 2  . sodium chloride  3 mL Intravenous Q12H   . dextrose 5 % and 0.9% NaCl 75 mL/hr (02/06/14 2129)    ASSESSMENT AND PLAN: 1. NSTEMI Type [id number],  with demand ischemia in the setting of anemia and chronic kidney disease, along with reduced ejection fraction of 45% per most recent echo. There is no evidence of CHF on exam. She was not on PO medications, but now they can be crushed and given. No discernable ischemic symptoms off nitrates. Add Isordil if she has ischemic symptoms. Will not plan repeat echo as she just had had in March of this year and is unlikely to change treatment regimen. No ischemic work up. No ASA due to anemia with hemoccult positive stools. No ACE due to renal failure. No statin due to elevated LFTs.   2. CAD: Most recent cardiac cath in 2011 with non-obstructive CAD. She has systolic dysfunction per echo which is improved to 40-45% from cardiac cath measurements of 20%-30%. Continue BB.   3. Atrial fibrillation: Admission EKG read as Atrial fib, but is actually Sinus brady. NSR since admission. No anticoagulant indicated.   4. Acute Metabolic Encephalopathy: Significant confusion, in the setting of anemia, dehydration and acute renal failure. IV fluid hydration is provided per IM/Renal.   5. Anemia: Hemoccult positive stools on admission with Hgb 5.6. Received blood transfusion 2 units. On protonix infusion. 9.1 after transfusion, now trending down, per IM/GI. May have EGD this admission.  Downward trend would suggest ongoing blood loss.    6. Acute Renal failure: Creatinine at baseline 1.09 but has been trending upwards since Feb of 2015 with significant jump from 1.85 to 4.20 on admission. No hemodialysis planned.   7. Hypertension: Blood pressure increased with fluid hydration, and blood transfusion. She is on Coreg, Amlodipine. Hydralazine added prn for BP elevation but has not been given. SBP 140s-150s, rarely > 160 last 48 hours, med changes per MD.  8. Recent Fall: Likely due to anemia and dehydration. Per IM. Was living w/ family PTA.   Signed, Lonn Georgia , PA-C 8:17 AM 02/07/2014  ATTENDING  ATTESTATION:  I have seen and evaluated the patient this AM along with Evelene Croon Barrett , PA-C. I agree with her findings, examination as well as impression recommendations.  Admitted with severe anemia - type 2 MI from Supply vs. Demand ischemia.  No plans for invasive evaluation during this hospitalization. We adjusted her medication yesterday -- will need additional BP control,but given ongoing concerns, would allow for mild permissive HTN peri-GI procedure.  May increase Hydralazine dose tomorrow.  No active cardiac Sx. Not in Afib.   Consider OP Myoview.  Leonie Man, M.D., M.S. Interventional Cardiologist  [place] Pager # [phone] 02/07/2014

## 2014-02-07 NOTE — Progress Notes (Signed)
CSW met with patient and 3 of her daughters today to provide bed offers.  Family requests bed at [facility] and this facility has offered a private room.  Possible dc tomorrow or Thursday per MD.  CSW spoke to [facility], site at [employer]. Bed will be available for patient when medically stable.  CSW will assist patient/family with d/c.  Patient was asleep during most of the visit today; she did arouse briefly. CSW will follow up with patient tomorrow. Lorie Phenix. [place], [place]

## 2014-02-07 NOTE — Progress Notes (Addendum)
Inpatient Diabetes Program Recommendations  AACE/ADA: New Consensus Statement on Inpatient Glycemic Control (2013)  Target Ranges:  Prepandial:   less than 140 mg/dL      Peak postprandial:   less than 180 mg/dL (1-2 hours)      Critically ill patients:  140 - 180 mg/dL  Results for [name], [name] (MRN [id number]) as of 02/07/2014 13:48  Ref. Range 02/06/2014 16:00 02/06/2014 20:54 02/07/2014 00:36 02/07/2014 04:26 02/07/2014 10:56  Glucose-Capillary Latest Range: 70-99 mg/dL 160 (H) 199 (H) 213 (H) 196 (H) 255 (H)   Inpatient Diabetes Program Recommendations Insulin - Basal: consider adding low dose basal Lantus 10 units  Change Novolog correction scale to TID.   Thank you  Raoul Pitch BSN, RN,CDE Inpatient Diabetes Coordinator [phone] (team pager)

## 2014-02-07 NOTE — Progress Notes (Signed)
Pt is being combative and uncooperative and not allowing RN to check her blood sugar. Will continue to monitor.

## 2014-02-07 NOTE — Care Management Note (Addendum)
  Page 1 of 1   02/10/2014     11:13:03 AM CARE MANAGEMENT NOTE 02/10/2014  Patient:  [name], [name]   Account Number:  [id number]  Date Initiated:  02/07/2014  Documentation initiated by:  Mariann Laster  Subjective/Objective Assessment:   admitted with Renal failure     Action/Plan:   CM to follow for disposition needs   Anticipated DC Date:  02/10/2014   Anticipated DC Plan:  SKILLED NURSING FACILITY  In-house referral  Clinical Social Worker      DC Planning Services  CM consult      Choice offered to / List presented to:             Status of service:  Completed, signed off Medicare Important Message given?  YES (If response is "NO", the following Medicare IM given date fields will be blank) Date Medicare IM given:  02/04/2014 Date Additional Medicare IM given:    Discharge Disposition:  IP REHAB FACILITY  Per UR Regulation:  Reviewed for med. necessity/level of care/duration of stay  If discussed at [place] of Stay Meetings, dates discussed:    Comments:  Jesi Jurgens RN, BSN, MSHL, CCM  Nurse - Case Manager, (Unit [facility])  [phone]  02/10/2014 CIR/Contact: Pamala Hurry confirms plan for CIR admission today 02/10/2014. SW confirms family able to provide patient with one week supervised care post CIR d/c.  Ankur Snowdon RN, BSN, MSHL, CCM  Nurse - Case Manager, (Unit [facility])  [phone]  4/28/2015Admitted with acute renal failure Intensity of Service:  Increased Creat, confused and positive for acute stroke Initial IM Letter provided on admission at  02/04/2014 / 12:56:49 PM Intensity of Service: PT/OT RECS:  SNF  (SW active) DME RECS:  3n1 Disposition Plan:  SNF CM will continue to follow for needs as indicated.

## 2014-02-07 NOTE — Progress Notes (Signed)
Speech Language Pathology Treatment: Dysphagia  Patient Details Name: SAVANA SPINA MRN: [id number] DOB: [birth year] Today's Date: 02/07/2014 Time: 4403-4742 SLP Time Calculation (min): 24 min  Assessment / Plan / Recommendation Clinical Impression  Pt with MRI showing watershed infarcts. Continues to tolerate thin liquids without evidence of aspiration. Pt also masticating soft solids. Right pocketing/residue is probable given right labial weakness and right sided neglect, however it was not observed during trials today. Pts family reports she has been reluctant to eat today due to the pureed consistency of her food. Will upgrade to dys 2 (fine chop) with thin liquids with the added precaution of checking for pocketing. Family understands. Will f/u for tolerance and continue cognitive linguistic therapy (see Speech language eval).    HPI HPI: Lanetra Hartley is a 80 y.o. female, lives at home with her son with h/o nonischemic cardiomyopathy, chronic combined systolic and diastolic CHF, OSA, hypertension, type 2 diabetes mellitus, diabetic gastroparesis, dyslipidemia, hypertension, CVA in the past, GI bleed in 2012 due to erosive gastritis, who was recently admitted for CHF comes back to the hospital after feeling weak for the last few weeks, few falls at home, at baseline she uses a walker and has early dementia. In the ER she was found to be obtunded, workup was suggestive of dehydration with acute renal failure, CT head chest x-ray was unremarkable.   Pertinent Vitals NA  SLP Plan  Continue with current plan of care    Recommendations Diet recommendations: Dysphagia 2 (fine chop);Thin liquid Liquids provided via: Cup;Straw Medication Administration: Crushed with puree Supervision: Staff to assist with self feeding;Full supervision/cueing for compensatory strategies Compensations: Slow rate;Small sips/bites Postural Changes and/or Swallow Maneuvers: Seated upright 90 degrees;Upright 30-60 min  after meal              General recommendations: Rehab consult Oral Care Recommendations: Oral care BID Follow up Recommendations: Skilled Nursing facility Plan: Continue with current plan of care    GO    [facility] [place], [place] CCC-SLP [place] Domique Reardon 02/07/2014, 2:57 PM

## 2014-02-07 NOTE — Progress Notes (Signed)
Called the daughter to see if she could come to hospital to sign consent form for EGD in the morning but voicemail is not set up to leave a message. Called the son and left a message on voicemail to return call. Awaiting to hear back to see if EGD consent form can be signed. Will continue to monitor patient to end of shift.

## 2014-02-08 ENCOUNTER — Inpatient Hospital Stay (HOSPITAL_COMMUNITY): Payer: Medicare Other | Admitting: Anesthesiology

## 2014-02-08 ENCOUNTER — Encounter (HOSPITAL_COMMUNITY): Payer: Medicare Other | Admitting: Anesthesiology

## 2014-02-08 ENCOUNTER — Encounter (HOSPITAL_COMMUNITY): Payer: Self-pay | Admitting: *Deleted

## 2014-02-08 ENCOUNTER — Encounter (HOSPITAL_COMMUNITY)
Admission: EM | Disposition: A | Discharge status: Skilled Nursing Facility | Payer: Self-pay | Source: Home / Self Care | Attending: Internal Medicine

## 2014-02-08 DIAGNOSIS — K2961 Other gastritis with bleeding: Secondary | ICD-10-CM | POA: Diagnosis not present

## 2014-02-08 DIAGNOSIS — R5381 Other malaise: Secondary | ICD-10-CM | POA: Diagnosis not present

## 2014-02-08 HISTORY — PX: ESOPHAGOGASTRODUODENOSCOPY: SHX5428

## 2014-02-08 LAB — COMPREHENSIVE METABOLIC PANEL
ALT: 110 U/L — ABNORMAL HIGH (ref 0–35)
AST: 40 U/L — ABNORMAL HIGH (ref 0–37)
Albumin: 2.6 g/dL — ABNORMAL LOW (ref 3.5–5.2)
Alkaline Phosphatase: 85 U/L (ref 39–117)
BILIRUBIN TOTAL: 0.9 mg/dL (ref 0.3–1.2)
BUN: 71 mg/dL — AB (ref 6–23)
CHLORIDE: 103 meq/L (ref 96–112)
CO2: 22 meq/L (ref 19–32)
CREATININE: 4.52 mg/dL — AB (ref 0.50–1.10)
Calcium: 8.6 mg/dL (ref 8.4–10.5)
GFR calc Af Amer: 10 mL/min — ABNORMAL LOW (ref >90)
GFR calc non Af Amer: 8 mL/min — ABNORMAL LOW (ref >90)
Glucose, Bld: 256 mg/dL — ABNORMAL HIGH (ref 70–99)
Potassium: 3.9 mEq/L (ref 3.7–5.3)
Sodium: 142 mEq/L (ref 137–147)
Total Protein: 6.3 g/dL (ref 6.0–8.3)

## 2014-02-08 LAB — GLUCOSE, CAPILLARY
GLUCOSE-CAPILLARY: 269 mg/dL — AB (ref 70–99)
Glucose-Capillary: 234 mg/dL — ABNORMAL HIGH (ref 70–99)
Glucose-Capillary: 318 mg/dL — ABNORMAL HIGH (ref 70–99)
Glucose-Capillary: 153 mg/dL — ABNORMAL HIGH (ref 70–99)
Glucose-Capillary: 153 mg/dL — ABNORMAL HIGH (ref 70–99)
Glucose-Capillary: 165 mg/dL — ABNORMAL HIGH (ref 70–99)

## 2014-02-08 LAB — CBC
HCT: 26.4 % — ABNORMAL LOW (ref 36.0–46.0)
Hemoglobin: 8.3 g/dL — ABNORMAL LOW (ref 12.0–15.0)
MCH: 22.7 pg — ABNORMAL LOW (ref 26.0–34.0)
MCHC: 31.4 g/dL (ref 30.0–36.0)
MCV: 72.1 fL — ABNORMAL LOW (ref 78.0–100.0)
PLATELETS: 196 10*3/uL (ref 150–400)
RBC: 3.66 MIL/uL — AB (ref 3.87–5.11)
RDW: 22.3 % — ABNORMAL HIGH (ref 11.5–15.5)
WBC: 8.9 10*3/uL (ref 4.0–10.5)

## 2014-02-08 SURGERY — EGD (ESOPHAGOGASTRODUODENOSCOPY)
Anesthesia: Monitor Anesthesia Care

## 2014-02-08 MED ORDER — PROPOFOL INFUSION 10 MG/ML OPTIME
INTRAVENOUS | Status: DC | PRN
  Administered 2014-02-08: 50 ug/kg/min via INTRAVENOUS

## 2014-02-08 MED ORDER — FENTANYL CITRATE 0.05 MG/ML IJ SOLN
INTRAMUSCULAR | Status: DC | PRN
  Administered 2014-02-08: 25 ug via INTRAVENOUS
  Administered 2014-02-08: 50 ug via INTRAVENOUS

## 2014-02-08 MED ORDER — ACETAMINOPHEN 325 MG PO TABS
650.0000 mg | ORAL_TABLET | ORAL | Status: DC | PRN
  Administered 2014-02-08: 650 mg via ORAL
  Filled 2014-02-08 (×2): qty 2

## 2014-02-08 MED ORDER — LIDOCAINE HCL (CARDIAC) 20 MG/ML IV SOLN
INTRAVENOUS | Status: DC | PRN
  Administered 2014-02-08: 30 mg via INTRAVENOUS

## 2014-02-08 MED ORDER — SODIUM CHLORIDE 0.9 % IV SOLN
INTRAVENOUS | Status: DC
  Administered 2014-02-08: 10:00:00 via INTRAVENOUS

## 2014-02-08 MED ORDER — SODIUM CHLORIDE 0.9 % IV SOLN
INTRAVENOUS | Status: DC | PRN
  Administered 2014-02-08: 15:00:00 via INTRAVENOUS

## 2014-02-08 NOTE — Progress Notes (Signed)
[facility] PROGRESS NOTE Interim History: 80 y.o. female, lives at home with her son with history of nonischemic cardiomyopathy, chronic combined systolic and diastolic CHF EF 78%, obstructive sleep apnea, hypertension, type 2 diabetes mellitus now insulin-dependent, diabetic gastroparesis, dyslipidemia, hypertension, CVA in the past, GI bleed in 2012 due to erosive gastritis, who was recently admitted for CHF and was discharged home on ACE inhibitor along with diuretic presented with an weakness, increased falls, melena. Just prior to admission she developed expressive aphasia, twisted lip, and lethargy. I suspect that she had some acute hypotensive event which may have been related to GI bleed which led to type II in standing, water shed infarcts, shock liver, and renal failure. Clinically, she has been improving. Cardiology was consult for her in STEMI. Nephrology follows for her acute renal failure. Her kidney function appears to be stabilizing and she is not a candidate for hemodialysis. Neurology was consulted after the report from the MRI returned. Gastroenterology is anticipating performing upper endoscopy on Wednesday 4/29.   Assessment/Plan: Acute stroke, possible watershed infarct vs. embolic strokes: - Speech and strength resolving. EEG negative for sz. ECHO and carotid duplex just completed a month ago.  - MRI: Multiple small bilateral infarcts in white matter  - EEG: Neg for epileptiform activity  - Neurology consult  - PT/OT/SLP: Recommending SNF with restricted diet  - Not on antiplatelet medication secondary to acute GIB.   Acute blood loss anemia: - Hemoccult-positive, hgb stable. Previous endoscopies for similar bleeding/anemia demonstrated erosive gastritis and normal colonoscopy.  - Protonix per GI  - Endoscopy on 4.29.2015.  NSTEMI, likely type 2, with hx of known CAD:  - Given her comorbidities, particularly her renal failure, she is not a good candidate for  left heart catheterization or invasive procedure. Sinus brady on telemetry.  - Per cardiology  - No ACE inhibitor secondary to acute renal failure  - Aspirin held because of concern for GIB  - Nitroglycerin when necessary.  ICM with EF 40-45% and grade 2 DD:  - no rales on exam  - Monitor for SOB  - BB okay, but ACEI on hold due to AKI.  HTN, blood pressure mildly elevated  - Continue BB, norvasc  - Per cardiology   Acute renal failure, baseline creatinine 1.09: - current creatinine clearance <15 and creatinine plateauing.  - No hydronephrosis on CT abdomen pelvis  - FENa 0.72%  - Given age and comorbidities, would likely not be a good candidate for hemodialysis  - Appreciate Nephrology assistance.  Abnl gallbladder with possible wall thickening on CT  - No abdominal pain on exam  - AST/ALT rising, bili wnl  - Lipase wnl   Mild transaminitis, likely shock liver from her acute event.  - Improving,  Repeat LFTs in a week   Anion gap metabolic acidosis:  may be secondary to elevated BUN. May also have some underlying RTA from renal failure, resolved with sodium bicarb fluids  - lactic acid 1.5.  DM2, A1c 11.5,  - hypoglycemic during initial day of hospitalization, now resolved.  - Low dose SSI   Hyponatremia, resolved with IVF  OSA, stable. Not on CPAP   Code Status: DNR  Family Communication: patient and extended family  Disposition Plan: To skilled nursing facility in one to 2 days with palliative care to follow    Consultants:  GI  Renal  Neurology  cardiology  Procedures:  Ct abd.  Ct head  MRI  CXR  Antibiotics:  none  HPI/Subjective: No complains  Objective: Filed Vitals:   02/07/14 2048 02/08/14 0510 02/08/14 0743 02/08/14 0900  BP:  156/54  169/60  Pulse:  51  55  Temp:  98.4 F (36.9 C)  98.1 F (36.7 C)  TempSrc:  Oral  Oral  Resp: 18 18  18   Height:      Weight:   62.2 kg (137 lb 2 oz)   SpO2: 98% 98%  100%     Intake/Output Summary (Last 24 hours) at 02/08/14 1011 Last data filed at 02/08/14 0133  Gross per 24 hour  Intake    360 ml  Output    725 ml  Net   -365 ml   Filed Weights   02/05/14 0530 02/07/14 0526 02/08/14 0743  Weight: 66 kg (145 lb 8.1 oz) 65.89 kg (145 lb 4.2 oz) 62.2 kg (137 lb 2 oz)    Exam:  General: Alert, awake, oriented x1, in no acute distress.  HEENT: No bruits, no goiter. NO JVD Heart: Regular rate and rhythm, without murmurs, rubs, gallops.  Lungs: Good air movement, clear Abdomen: Soft, nontender, nondistended, positive bowel sounds.    Data Reviewed: Basic Metabolic Panel:  Recent Labs Lab 02/05/14 0115 02/05/14 0655 02/06/14 0510 02/07/14 0535 02/08/14 0458  NA 137 133* 137 142 142  K 4.1 4.8 3.5* 3.6* 3.9  CL 101 99 98 104 103  CO2 13* 12* 24 20 22   GLUCOSE 111* 135* 62* 218* 256*  BUN 71* 74* 76* 74* 71*  CREATININE 4.20* 4.25* 4.67* 4.67* 4.52*  CALCIUM 8.5 8.6 8.2* 8.2* 8.6   Liver Function Tests:  Recent Labs Lab 02/05/14 0655 02/06/14 0510 02/07/14 0535 02/08/14 0458  AST 97* 126* 96* 40*  ALT 53* 89* 143* 110*  ALKPHOS 93 95 86 85  BILITOT 1.2 1.1 0.9 0.9  PROT 6.8 6.0 5.9* 6.3  ALBUMIN 3.0* 2.7* 2.4* 2.6*    Recent Labs Lab 02/05/14 1650  LIPASE 28    Recent Labs Lab 02/05/14 1650  AMMONIA 26   CBC:  Recent Labs Lab 02/04/14 1158  02/04/14 1810 02/05/14 0655 02/05/14 1650 02/06/14 0510 02/07/14 0535 02/08/14 0458  WBC 9.1  --  7.6 9.2  --  8.9 8.7 8.9  NEUTROABS 6.3  --   --   --   --   --   --   --   HGB 6.2*  < > 5.6* 9.1* 8.5* 8.3* 8.2* 8.3*  HCT 19.9*  < > 18.6* 27.5* 25.6* 25.0* 25.9* 26.4*  MCV 65.0*  --  65.0* 70.5*  --  70.8* 72.5* 72.1*  PLT 283  --  230 145*  --  198 202 196  < > = values in this interval not displayed. Cardiac Enzymes:  Recent Labs Lab 02/04/14 1810 02/05/14 0115 02/05/14 0655 02/05/14 1650 02/05/14 2330  TROPONINI <0.30 0.58* 0.83* 1.83* 1.55*   BNP (last 3  results)  Recent Labs  12/03/13 1808 12/05/13 0448  PROBNP 7767.0* 6023.0*   CBG:  Recent Labs Lab 02/07/14 1602 02/07/14 2003 02/08/14 0006 02/08/14 0434 02/08/14 0801  GLUCAP 258* 302* 269* 234* 318*    Recent Results (from the past 240 hour(s))  MRSA PCR SCREENING     Status: None   Collection Time    02/05/14  8:03 AM      Result Value Ref Range Status   MRSA by PCR NEGATIVE  NEGATIVE Final   Comment:  The GeneXpert MRSA Assay (FDA     approved for NASAL specimens     only), is one component of a     comprehensive MRSA colonization     surveillance program. It is not     intended to diagnose MRSA     infection nor to guide or     monitor treatment for     MRSA infections.     Studies: Mr Herby Abraham Contrast  02/07/2014   CLINICAL DATA:  80 year old female with slurred speech. Initial encounter.  EXAM: MRI HEAD WITHOUT CONTRAST  TECHNIQUE: Multiplanar, multiecho pulse sequences of the brain and surrounding structures were obtained without intravenous contrast.  COMPARISON:  Head CT without contrast 03/06/2014.  FINDINGS: The examination had to be discontinued prior to completion due to patient claustrophobia, the referring provider did not wish to utilized sedation or anxiolysis for the patient at this time.  Axial and coronal diffusion weighted imaging was obtained, and is motion degraded despite repeated imaging attempts.  Multiple small (up to 8 mm) foci of restricted diffusion in the bilateral cerebral white matter. Corona radiata have fully affected on the left. There may be some cortical involvement of the right cingulate it (series 3, image 16) cough otherwise cortex is spared. Bilateral hemisphere involvement. No involvement of the deep gray matter nuclei or posterior fossa identified.  No ventriculomegaly or intracranial mass effect.  No midline shift.  IMPRESSION: Truncated and motion degraded exam demonstrating multiple foci of abnormal signal primarily in  the bilateral cerebral white matter.  Top differential considerations are acute watershed ischemia (such as due to recent hypotensive event) and sequelae of embolic event (although no posterior fossa involvement identified).  Study discussed by telephone with Dr. Noah Delaine SHORT on 02/07/2014 at 1014 hours .   Electronically Signed   By: Lars Pinks M.D.   On: 02/07/2014 10:14    Scheduled Meds: . amLODipine  10 mg Oral Daily  . atorvastatin  40 mg Oral q1800  . carvedilol  3.125 mg Oral BID WC  . insulin aspart  0-9 Units Subcutaneous Q4H  . multivitamin with minerals  1 tablet Oral Daily  . pantoprazole  40 mg Oral Q0600   Continuous Infusions: . sodium chloride    . sodium chloride 20 mL/hr at 02/08/14 [facility] Pager [phone]. If 8PM-8AM, please contact night-coverage at [email or web address], password [facility] 02/08/2014, 10:11 AM  LOS: 4 days

## 2014-02-08 NOTE — Progress Notes (Signed)
Physical Therapy Treatment Patient Details Name: [name] MRN: [id number] DOB: 05/29/1933 Today's Date: 2014-02-18    History of Present Illness Metabolic encephalopathy with generalized weakness and multiple falls, likely due to combination of acute renal failure, NSTEMI, dehydration, and metabolic acidosis, possible lactic acidosis, GI bleed;  feeling weak for the last few weeks, few falls at home, at baseline she uses a walker and has early dementia.       Follow Up Recommendations  SNF     Equipment Recommendations  3in1 (PT)    Recommendations for Other Services       Precautions / Restrictions Precautions Precautions: Fall Restrictions Weight Bearing Restrictions: No    Mobility  Bed Mobility Overal bed mobility: +2 for physical assistance Bed Mobility: Supine to Sit;Sit to Supine     Supine to sit: Max assist;+2 for physical assistance Sit to supine: Total assist;+2 for physical assistance   General bed mobility comments: cues for sequencing & technique.  (A) to lift shoulders/trunk to sitting upright, pivot hips around to EOB, & to lift LE's back into bed.    Transfers Overall transfer level: Needs assistance   Transfers: Sit to/from Stand;Stand Pivot Transfers Sit to Stand: Max assist;+2 safety/equipment Stand pivot transfers: Max assist       General transfer comment: cues for sequencing & technique.  1st trial of SPT bed>3-in-1 +2 was used & 2nd trial performed with patient-therapist face-to-face positioning.    Ambulation/Gait                 Stairs            Wheelchair Mobility    Modified Rankin (Stroke Patients Only)       Balance                                    Cognition Arousal/Alertness: Awake/alert Behavior During Therapy: Flat affect Overall Cognitive Status: Impaired/Different from baseline Area of Impairment: Attention;Following commands;Problem solving   Current Attention Level: Focused    Following Commands: Follows one step commands inconsistently;Follows one step commands with increased time     Problem Solving: Slow processing;Decreased initiation;Difficulty sequencing;Requires verbal cues;Requires tactile cues      Exercises      General Comments        Pertinent Vitals/Pain No pain reported.      Home Living                      Prior Function            PT Goals (current goals can now be found in the care plan section) Acute Rehab PT Goals Patient Stated Goal: none stated PT Goal Formulation: With patient/family Time For Goal Achievement: 02/20/14 Potential to Achieve Goals: Good Progress towards PT goals: Progressing toward goals    Frequency  Min 3X/week    PT Plan Current plan remains appropriate    Co-evaluation             End of Session Equipment Utilized During Treatment: Gait belt Activity Tolerance: Patient tolerated treatment well Patient left: in bed;with family/visitor present     Time: 4098-1191 PT Time Calculation (min): 25 min  Charges:  $Therapeutic Activity: 23-37 mins                    G Codes:      Sena Hitch 18-Feb-2014, 3:21 PM  Sarajane Marek, [place] [phone] 02/08/2014

## 2014-02-08 NOTE — Progress Notes (Signed)
Applied tourniquet and attempted to insert IV x1 and was unsuccessful. Notified IV Team for patient is going for an EGD tomorrow and has an order to run normal saline prior to procedure. Pt tolerated it well. Will continue to monitor to end of shift.

## 2014-02-08 NOTE — Progress Notes (Signed)
Stroke Team Progress Note  HISTORY [name] is an 80 y.o. female presented with an weakness, increased falls, melena. Just prior to admission she developed expressive aphasia, twisted lip, and lethargy. While in hospital she was found to have acute blood loss anemia, Hemoccult-positive, hgb drifting down. Previous endoscopies for similar bleeding/anemia demonstrated erosive gastritis. MRI was obtained showing multiple foci of abnormal signal primarily in the bilateral cerebral white matter consistent with watershed infarct. Currently she is not on antiplatelet due to GI bleed and scheduled for EGD tomorrow.  Neuro work up while in hospital showed normal EEG, recent carotid doppler 12/28/13 showing no ICA stenosis and Echo on 12/28/13 showing EF 40-45% with no mention of PFO. Of note--family is going to have a palliative care meeting. last known well unable to determined. Patient was not administered TPA secondary to unknown time of onset.   SUBJECTIVE Her family is at the bedside.    OBJECTIVE Most recent Vital Signs: Filed Vitals:   02/07/14 2048 02/08/14 0510 02/08/14 0743 02/08/14 0900  BP:  156/54  169/60  Pulse:  51  55  Temp:  98.4 F (36.9 C)  98.1 F (36.7 C)  TempSrc:  Oral  Oral  Resp: 18 18  18   Height:      Weight:   62.2 kg (137 lb 2 oz)   SpO2: 98% 98%  100%   CBG (last 3)   Recent Labs  02/08/14 0006 02/08/14 0434 02/08/14 0801  GLUCAP 269* 234* 318*    IV Fluid Intake:   . sodium chloride    . sodium chloride 20 mL/hr at 02/08/14 0944    MEDICATIONS  . amLODipine  10 mg Oral Daily  . atorvastatin  40 mg Oral q1800  . carvedilol  3.125 mg Oral BID WC  . insulin aspart  0-9 Units Subcutaneous Q4H  . multivitamin with minerals  1 tablet Oral Daily  . pantoprazole  40 mg Oral Q0600   PRN:  feeding supplement (GLUCERNA SHAKE), hydrALAZINE, nitroGLYCERIN, ondansetron (ZOFRAN) IV, ondansetron  Diet:  NPO  Activity:  Up with assistance DVT Prophylaxis:   SCDs   CLINICALLY SIGNIFICANT STUDIES Basic Metabolic Panel:   Recent Labs Lab 02/07/14 0535 02/08/14 0458  NA 142 142  K 3.6* 3.9  CL 104 103  CO2 20 22  GLUCOSE 218* 256*  BUN 74* 71*  CREATININE 4.67* 4.52*  CALCIUM 8.2* 8.6   Liver Function Tests:   Recent Labs Lab 02/07/14 0535 02/08/14 0458  AST 96* 40*  ALT 143* 110*  ALKPHOS 86 85  BILITOT 0.9 0.9  PROT 5.9* 6.3  ALBUMIN 2.4* 2.6*   CBC:  Recent Labs Lab 02/04/14 1158  02/07/14 0535 02/08/14 0458  WBC 9.1  < > 8.7 8.9  NEUTROABS 6.3  --   --   --   HGB 6.2*  < > 8.2* 8.3*  HCT 19.9*  < > 25.9* 26.4*  MCV 65.0*  < > 72.5* 72.1*  PLT 283  < > 202 196  < > = values in this interval not displayed. Coagulation: No results found for this basename: LABPROT, INR,  in the last 168 hours Cardiac Enzymes:   Recent Labs Lab 02/05/14 0655 02/05/14 1650 02/05/14 2330  TROPONINI 0.83* 1.83* 1.55*   Urinalysis:   Recent Labs Lab 02/04/14 1203  COLORURINE YELLOW  LABSPEC 1.018  PHURINE 5.0  GLUCOSEU NEGATIVE  HGBUR NEGATIVE  BILIRUBINUR NEGATIVE  KETONESUR NEGATIVE  PROTEINUR 100*  UROBILINOGEN 0.2  NITRITE  NEGATIVE  LEUKOCYTESUR NEGATIVE   Lipid Panel    Component Value Date/Time   CHOL 206* 12/12/2011 1041   TRIG 64 12/12/2011 1041   HDL 90 12/12/2011 1041   CHOLHDL 2.3 12/12/2011 1041   VLDL 13 12/12/2011 1041   LDLCALC 103* 12/12/2011 1041   HgbA1C  Lab Results  Component Value Date   HGBA1C 11.5* 02/04/2014    Urine Drug Screen:     Component Value Date/Time   LABOPIA NONE DETECTED 07/10/2008 1721   COCAINSCRNUR NONE DETECTED 07/10/2008 1721   LABBENZ NONE DETECTED 07/10/2008 1721   AMPHETMU NONE DETECTED 07/10/2008 1721   THCU NONE DETECTED 07/10/2008 1721   LABBARB  Value: NONE DETECTED        DRUG SCREEN FOR MEDICAL PURPOSES ONLY.  IF CONFIRMATION IS NEEDED FOR ANY PURPOSE, NOTIFY LAB WITHIN 5 DAYS. 07/10/2008 1721    Alcohol Level: No results found for this basename: ETH,  in the last 168  hours  Ct Abdomen Pelvis Wo Contrast 02/05/2014   1. Haziness about the gallbladder could reflect gallbladder wall thickening, or could be artifactual in nature. Evaluation is mildly suboptimal due to motion artifact. Would correlate for associated symptoms. 2. No evidence of hydronephrosis; kidneys grossly unremarkable in appearance. 3. Diffuse calcification along the abdominal aorta and its branches, including along the superior mesenteric artery. 4. Diffuse coronary artery calcifications seen. 5. Trace right-sided pleural fluid noted. 6. Degenerative change noted at the left hip.       CT of the brain  02/04/2014    No evidence of acute intracranial abnormality.  Age related atrophy with small vessel ischemic changes and intracranial atherosclerosis.     MRI of the brain  02/07/2014   Truncated and motion degraded exam demonstrating multiple foci of abnormal signal primarily in the bilateral cerebral white matter.  Top differential considerations are acute watershed ischemia (such as due to recent hypotensive event) and sequelae of embolic event (although no posterior fossa involvement identified).    MRA of the brain    2D Echocardiogram  12/28/2013 EF 40-45% with no source of embolus.  Carotid Doppler  12/28/2013 No evidence of hemodynamically significant internal carotid artery stenosis. Vertebral artery flow is antegrade.   CXR  02/04/2014    Moderate enlargement of the cardiac silhouette.  Lungs clear.     EKG  atrial fibrillation, rate 65. For complete results please see formal report.   Therapy Recommendations SNF  Physical Exam   Elderly lady not in distress.Awake alert. Afebrile. Head is nontraumatic. Neck is supple without bruit. Hearing is normal. Cardiac exam no murmur or gallop. Lungs are clear to auscultation. Distal pulses are well felt. Neurological Exam : Awake alert oriented x2. Mild dysarthria but can easily be understood. No aphasia. Follows commands well. Extraocular  movements full range without nystagmus. Mild right lower facial asymmetry. Tongue midline. Motor system exam no upper or lower extremity drift with mild weakness of right grip, intrinsic hand muscles, right hip flexor and ankle dorsiflexors. Coordination slow on the right compared to the left. Sensation intact. Plantars both downgoing. Gait was not tested. ASSESSMENT [name] is a 80 y.o. female admitted for weakness, increased falls and melena who also hads expressive aphasia, facial weakness and lethargy. Radiology read MRI as bilateral watershed infarcts; infarcts are bilateral, but thought to be scattered embolic infarct secondary to atrial fibrillation as there is no documented hypotension or carotid stenosis.  On aspirin 81 mg orally every day prior to admission.  Now on no antithrombotics for secondary stroke prevention. Patient with resultant expressive aphasia. Stroke work up underway.   atrial fibrillation, new finding  She was on coumadin during her 05/30/2010 admission when she had a post cath stroke that was thought to be embolic. It is documented that Dr. Claiborne Billings felt she had risk of thrombus given low EF. She was not seen by neurology. She was on coumadin up until 1.5 yrs ago when it was stopped due to GI hemorrhage.    Acute blood loss anemia  Previous endoscopies for similar bleeding/anemia demonstrated erosive gastritis and normal colonoscopy.   Stool guiac positive  hgb stable  Endoscopy planned for today  hypertension Hyperlipidemia, LDL 103 in March, on crestor 20 mg daily PTA, now on lipitor 40 mg daily, goal LDL < 70 for diabetics Diabetes, HgbA1c 11.5, goal < 7.0 CAD - NSTEMI this admission, hx MI Migraines obstructive sleep apnea  Hx stroke - summer 2011 CHF - ICM w/ EF 40-45% Acute renal failure  Elevated LFTs, likely shock liver from acute event, improving. Plan recheck in 1 week   Hospital day # 4  TREATMENT/PLAN  Follow EGD results  If EGD  stable, may recommend NOAC eliquis 2.5 mg bid due to low risk of bleeding. This would be for secondary stroke prevention from afib.  Check lipid panel in am  Dispo:  SNF  Burnetta Sabin, MSN, RN, ANVP-BC, AGPCNP-BC [facility] Pager: [phone] 02/08/2014 9:44 AM  I have personally obtained a history, examined the patient, evaluated imaging results, and formulated the assessment and plan of care. I agree with the above. Antony Contras, MD   To contact Stroke Continuity provider, please refer to [email or web address] After hours, contact General Neurology

## 2014-02-08 NOTE — Anesthesia Postprocedure Evaluation (Signed)
  Anesthesia Post-op Note  Patient: [name]  Procedure(s) Performed: Procedure(s): ESOPHAGOGASTRODUODENOSCOPY (EGD) (N/A)  Patient Location: PACU  Anesthesia Type:MAC  Level of Consciousness: awake and sedated  Airway and Oxygen Therapy: Patient Spontanous Breathing  Post-op Pain: none  Post-op Assessment: Post-op Vital signs reviewed  Post-op Vital Signs: stable  Last Vitals:  Filed Vitals:   02/08/14 1548  BP: 185/57  Pulse: 55  Temp:   Resp: 21    Complications: No apparent anesthesia complications

## 2014-02-08 NOTE — Anesthesia Preprocedure Evaluation (Signed)
Anesthesia Evaluation  Patient identified by MRN, date of birth, ID band Patient awake    Reviewed: Allergy & Precautions, H&P , NPO status , Patient's Chart, lab work & pertinent test results, reviewed documented beta blocker date and time   Airway Mallampati: II TM Distance: >3 FB Neck ROM: full    Dental   Pulmonary shortness of breath, asthma , sleep apnea and Oxygen sleep apnea , pneumonia -,  breath sounds clear to auscultation        Cardiovascular hypertension, Pt. on medications and Pt. on home beta blockers + angina + CAD, + Past MI, + Peripheral Vascular Disease and +CHF Rhythm:regular     Neuro/Psych  Headaches,  Neuromuscular disease CVA, Residual Symptoms negative psych ROS   GI/Hepatic Neg liver ROS, GERD-  Medicated,  Endo/Other  diabetes, Insulin Dependent  Renal/GU Renal InsufficiencyRenal disease  negative genitourinary   Musculoskeletal   Abdominal   Peds  Hematology  (+) Blood dyscrasia, anemia ,   Anesthesia Other Findings See surgeon's H&P   Reproductive/Obstetrics negative OB ROS                           Anesthesia Physical Anesthesia Plan  ASA: III  Anesthesia Plan: MAC   Post-op Pain Management:    Induction: Intravenous  Airway Management Planned: Nasal Cannula  Additional Equipment:   Intra-op Plan:   Post-operative Plan:   Informed Consent: I have reviewed the patients History and Physical, chart, labs and discussed the procedure including the risks, benefits and alternatives for the proposed anesthesia with the patient or authorized representative who has indicated his/her understanding and acceptance.   Dental Advisory Given  Plan Discussed with: CRNA and Surgeon  Anesthesia Plan Comments:         Anesthesia Quick Evaluation

## 2014-02-08 NOTE — Progress Notes (Signed)
UR completed Ramaj Frangos K. Maxcine Strong, RN, BSN, [place], CCM  02/08/2014 7:05 PM

## 2014-02-08 NOTE — Op Note (Addendum)
[facility] [place], [id number]   ENDOSCOPY PROCEDURE REPORT  PATIENT: Cheryl, Hurley  MR#: [id number] BIRTHDATE: 08-Jan-1933 , 39  yrs. old GENDER: Female ENDOSCOPIST: [name], MD, [facility] PROCEDURE DATE:  02/08/2014 PROCEDURE:  EGD w/ biopsy ASA CLASS:     Class IV INDICATIONS:  Melena. MEDICATIONS: See Anesthesia Report and MAC sedation, administered by CRNA TOPICAL ANESTHETIC: none  DESCRIPTION OF PROCEDURE: After the risks benefits and alternatives of the procedure were thoroughly explained, informed consent was obtained.  The Pentax Gastroscope [id number] endoscope was introduced through the mouth and advanced to the second portion of the duodenum. Without limitations.  The instrument was slowly withdrawn as the mucosa was fully examined.        STOMACH: Mild erosive gastritis (inflammation) was found in the gastric antrum.  The remainder of the upper endoscopy exam was otherwise normal. Retroflexed views revealed no abnormalities.     The scope was then withdrawn from the patient and the procedure completed.  COMPLICATIONS: There were no complications. ENDOSCOPIC IMPRESSION: 1.   Erosive gastritis (inflammation) was found in the gastric antrum 2.   The remainder of the upper endoscopy exam was otherwise normal  RECOMMENDATIONS: Stay on PPI ok to use 81 mg ASA daily in 2 weeks if you think she needs it - avoid NSAID's otherwise See GI prn - cannot be absolutely certain this gastritis caused melena but since she had been taking BC powders it is likely so would not pursue a colonosciopy at this time    eSigned:  [name], MD, [facility] 02/08/2014 3:58 PM Revised: 02/08/2014 3:58 PM

## 2014-02-08 NOTE — Progress Notes (Deleted)
Notified oncall doctor Dr. Jules Husbands of patient having 6beats of Vtach and that during the day shift she had 9bts of Vtach. Orders given to continue to monitor patient and if it occurs again to notify doctor. Will continue to monitor patient to end of shift.

## 2014-02-08 NOTE — Transfer of Care (Signed)
Immediate Anesthesia Transfer of Care Note  Patient: [name]  Procedure(s) Performed: Procedure(s): ESOPHAGOGASTRODUODENOSCOPY (EGD) (N/A)  Patient Location: Endoscopy Unit  Anesthesia Type:MAC  Level of Consciousness: awake, alert  and patient cooperative  Airway & Oxygen Therapy: Patient Spontanous Breathing and Patient connected to face mask oxygen  Post-op Assessment: Report given to PACU RN and Post -op Vital signs reviewed and stable  Post vital signs: Reviewed and stable  Complications: No apparent anesthesia complications

## 2014-02-08 NOTE — Progress Notes (Signed)
Speech Language Pathology Treatment: Cognitive-Linquistic  Patient Details Name: [name] MRN: [id number] DOB: 07-15-1933 Today's Date: 02/08/2014 Time: 0840-0910 SLP Time Calculation (min): 30 min  Assessment / Plan / Recommendation Clinical Impression  Treatment focused on sustained attention to functional task, facilitating functional communication in basic tasks and diagnostic treatment of auditory comprehension. Pt more alert this am, able to name herself and her son with moderate intelligiblity, responding verbally yes/no with chance accuracy. Pt required max visual and verbal cues to focus and sustain attention to basic tasks and needs max assist for problem solving. Awareness of deficits is very poor. Baseline mild dementia appears exacerbated by current condition; pt is restless and rocking back and forth which her son says she was doing minimally at baseline. Will need continued SLP therapy, continue to suggest CIR consult.    HPI HPI: [name] is a 80 y.o. female, lives at home with her son with h/o nonischemic cardiomyopathy, chronic combined systolic and diastolic CHF, OSA, hypertension, type 2 diabetes mellitus, diabetic gastroparesis, dyslipidemia, hypertension, CVA in the past, GI bleed in 2012 due to erosive gastritis, who was recently admitted for CHF comes back to the hospital after feeling weak for the last few weeks, few falls at home, at baseline she uses a walker and has early dementia. In the ER she was found to be obtunded, workup was suggestive of dehydration with acute renal failure, CT head chest x-ray was unremarkable.   Pertinent Vitals NA  SLP Plan  Continue with current plan of care    Recommendations                General recommendations: Rehab consult Oral Care Recommendations: Oral care BID Follow up Recommendations: Inpatient Rehab Plan: Continue with current plan of care    GO    [facility], MA CCC-SLP Cheryl  [name] 02/08/2014, 9:56 AM

## 2014-02-08 NOTE — Progress Notes (Signed)
Inpatient Diabetes Program Recommendations  AACE/ADA: New Consensus Statement on Inpatient Glycemic Control (2013)  Target Ranges:  Prepandial:   less than 140 mg/dL      Peak postprandial:   less than 180 mg/dL (1-2 hours)      Critically ill patients:  140 - 180 mg/dL   Reason for Visit: Results for PACEY, WILLADSEN (MRN [id number]) as of 02/08/2014 09:13  Ref. Range 02/07/2014 16:02 02/07/2014 20:03 02/08/2014 00:06 02/08/2014 04:34 02/08/2014 08:01  Glucose-Capillary Latest Range: 70-99 mg/dL 258 (H) 302 (H) 269 (H) 234 (H) 318 (H)   Diabetes history: Type 2 diabetes Outpatient Diabetes medications: Lantus 4 units at dinner and 34 units at HS, Novolog 7 units with breakfast Current orders for Inpatient glycemic control: Novolog sensitive q 4 hours  Please consider restarting a portion of patient's home dose of Lantus 15 units daily.  Also consider changing Novolog correction to moderate tid with meals.    Thanks, Adah Perl, RN, BC-ADM Inpatient Diabetes Coordinator Pager [phone]

## 2014-02-08 NOTE — Progress Notes (Signed)
Daily Rounding Note  02/08/2014, 9:18 AM  LOS: 4 days   SUBJECTIVE:       No acute events overnight, currently resting quietly. Knows who her son is this AM.   OBJECTIVE:         Vital signs in last 24 hours:    Temp:  [98 F (36.7 C)-98.7 F (37.1 C)] 98.4 F (36.9 C) (04/29 0510) Pulse Rate:  [51-66] 51 (04/29 0510) Resp:  [18] 18 (04/29 0510) BP: (143-165)/(49-55) 156/54 mmHg (04/29 0510) SpO2:  [98 %-100 %] 98 % (04/29 0510) Weight:  [62.2 kg (137 lb 2 oz)] 62.2 kg (137 lb 2 oz) (04/29 0743) Last BM Date: 02/07/14 General: comfortable, not acutely ill.   Heart: RRR Chest: clear, but diminished BS globally.  No cough or dyspnea Abdomen: soft, NT, ND.  BS hypoactive  Extremities: no CCE Neuro/Psych:  Cooperative, arouses with minor prompting, follows simple commands. Answers yes/no questions appropriately  Intake/Output from previous day: 04/28 0701 - 04/29 0700 In: 480 [P.O.:480] Out: 925 [Urine:925]  Intake/Output this shift:    Lab Results:  Recent Labs  02/06/14 0510 02/07/14 0535 02/08/14 0458  WBC 8.9 8.7 8.9  HGB 8.3* 8.2* 8.3*  HCT 25.0* 25.9* 26.4*  PLT 198 202 196   BMET  Recent Labs  02/06/14 0510 02/07/14 0535 02/08/14 0458  NA 137 142 142  K 3.5* 3.6* 3.9  CL 98 104 103  CO2 24 20 22   GLUCOSE 62* 218* 256*  BUN 76* 74* 71*  CREATININE 4.67* 4.67* 4.52*  CALCIUM 8.2* 8.2* 8.6   LFT  Recent Labs  02/06/14 0510 02/07/14 0535 02/08/14 0458  PROT 6.0 5.9* 6.3  ALBUMIN 2.7* 2.4* 2.6*  AST 126* 96* 40*  ALT 89* 143* 110*  ALKPHOS 95 86 85  BILITOT 1.1 0.9 0.9   PT/INR No results found for this basename: LABPROT, INR,  in the last 72 hours Hepatitis Panel  Recent Labs  02/07/14 0535  HEPBSAG NEGATIVE  HCVAB NEGATIVE  HEPAIGM NON REACTIVE  HEPBIGM NON REACTIVE    Studies/Results: Mr Brain Wo Contrast 02/07/2014   COMPARISON:  Head CT without contrast  03/06/2014.  FINDINGS: The examination had to be discontinued prior to completion due to patient claustrophobia, the referring provider did not wish to utilized sedation or anxiolysis for the patient at this time.  Axial and coronal diffusion weighted imaging was obtained, and is motion degraded despite repeated imaging attempts.  Multiple small (up to 8 mm) foci of restricted diffusion in the bilateral cerebral white matter. Corona radiata have fully affected on the left. There may be some cortical involvement of the right cingulate it (series 3, image 16) cough otherwise cortex is spared. Bilateral hemisphere involvement. No involvement of the deep gray matter nuclei or posterior fossa identified.  No ventriculomegaly or intracranial mass effect.  No midline shift.  IMPRESSION: Truncated and motion degraded exam demonstrating multiple foci of abnormal signal primarily in the bilateral cerebral white matter.  Top differential considerations are acute watershed ischemia (such as due to recent hypotensive event) and sequelae of embolic event (although no posterior fossa involvement identified).  Study discussed by telephone with Dr. Noah Delaine SHORT on 02/07/2014 at 1014 hours .   Electronically Signed   By: Lars Pinks M.D.   On: 02/07/2014 10:14   Scheduled Meds: . amLODipine  10 mg Oral Daily  . atorvastatin  40 mg Oral q1800  . carvedilol  3.125  mg Oral BID WC  . insulin aspart  0-9 Units Subcutaneous Q4H  . multivitamin with minerals  1 tablet Oral Daily  . pantoprazole  40 mg Oral Q0600   Continuous Infusions: . sodium chloride    . sodium chloride     PRN Meds:.feeding supplement (GLUCERNA SHAKE), hydrALAZINE, nitroGLYCERIN, ondansetron (ZOFRAN) IV, ondansetron   ASSESMENT:   *  UGIB (melena) EGD in 2012: gastritis, no PPI at home.  Protonix started 4/25. *  Iron def anemia. Hgb stable post PRBCs x 2.  Suspect multifactorial: GI blood loss, renal disease, chronic disease, malnutrition.   *   Watershed infarcts.  Antiplatelet Rx not in use due to GIB. AMS improved.  *  IDDM, poor control historically. *  diastolic > systolic heart failure.  *  Elevated LFTs, suspect hypoperfusion/mild shock liver.    PLAN   *  Neuro would like to add ASA if ok with GI.  *  EGD today.     Vena Rua  02/08/2014, 9:18 AM Pager: [phone]

## 2014-02-09 ENCOUNTER — Encounter (HOSPITAL_COMMUNITY): Payer: Self-pay | Admitting: Internal Medicine

## 2014-02-09 DIAGNOSIS — I639 Cerebral infarction, unspecified: Secondary | ICD-10-CM

## 2014-02-09 DIAGNOSIS — I6340 Cerebral infarction due to embolism of unspecified cerebral artery: Secondary | ICD-10-CM

## 2014-02-09 DIAGNOSIS — K219 Gastro-esophageal reflux disease without esophagitis: Secondary | ICD-10-CM

## 2014-02-09 HISTORY — DX: Cerebral infarction, unspecified: I63.9

## 2014-02-09 LAB — LIPID PANEL
CHOL/HDL RATIO: 2.2 ratio
CHOLESTEROL: 130 mg/dL (ref 0–200)
HDL: 60 mg/dL (ref >39)
LDL CALC: 58 mg/dL (ref 0–99)
TRIGLYCERIDES: 61 mg/dL (ref <150)
VLDL: 12 mg/dL (ref 0–40)

## 2014-02-09 LAB — COMPREHENSIVE METABOLIC PANEL
ALT: 81 U/L — ABNORMAL HIGH (ref 0–35)
AST: 33 U/L (ref 0–37)
Albumin: 2.5 g/dL — ABNORMAL LOW (ref 3.5–5.2)
Alkaline Phosphatase: 84 U/L (ref 39–117)
BUN: 68 mg/dL — ABNORMAL HIGH (ref 6–23)
CALCIUM: 8.9 mg/dL (ref 8.4–10.5)
CO2: 20 mEq/L (ref 19–32)
CREATININE: 4.62 mg/dL — AB (ref 0.50–1.10)
Chloride: 106 mEq/L (ref 96–112)
GFR calc non Af Amer: 8 mL/min — ABNORMAL LOW (ref >90)
GFR, EST AFRICAN AMERICAN: 10 mL/min — AB (ref >90)
GLUCOSE: 170 mg/dL — AB (ref 70–99)
Potassium: 4.3 mEq/L (ref 3.7–5.3)
SODIUM: 143 meq/L (ref 137–147)
Total Bilirubin: 0.9 mg/dL (ref 0.3–1.2)
Total Protein: 6.5 g/dL (ref 6.0–8.3)

## 2014-02-09 LAB — GLUCOSE, CAPILLARY
GLUCOSE-CAPILLARY: 161 mg/dL — AB (ref 70–99)
GLUCOSE-CAPILLARY: 73 mg/dL (ref 70–99)
GLUCOSE-CAPILLARY: 262 mg/dL — AB (ref 70–99)
GLUCOSE-CAPILLARY: 276 mg/dL — AB (ref 70–99)
Glucose-Capillary: 166 mg/dL — ABNORMAL HIGH (ref 70–99)
Glucose-Capillary: 176 mg/dL — ABNORMAL HIGH (ref 70–99)
Glucose-Capillary: 302 mg/dL — ABNORMAL HIGH (ref 70–99)

## 2014-02-09 LAB — CBC
HEMATOCRIT: 27.6 % — AB (ref 36.0–46.0)
HEMOGLOBIN: 8.7 g/dL — AB (ref 12.0–15.0)
MCH: 22.8 pg — ABNORMAL LOW (ref 26.0–34.0)
MCHC: 31.5 g/dL (ref 30.0–36.0)
MCV: 72.4 fL — ABNORMAL LOW (ref 78.0–100.0)
Platelets: 261 10*3/uL (ref 150–400)
RBC: 3.81 MIL/uL — AB (ref 3.87–5.11)
RDW: 23.1 % — ABNORMAL HIGH (ref 11.5–15.5)
WBC: 8.3 10*3/uL (ref 4.0–10.5)

## 2014-02-09 MED ORDER — ISOSORBIDE MONONITRATE ER 60 MG PO TB24
60.0000 mg | ORAL_TABLET | Freq: Every day | ORAL | Status: DC
  Administered 2014-02-09 – 2014-02-10 (×2): 60 mg via ORAL
  Filled 2014-02-09 (×2): qty 1

## 2014-02-09 MED ORDER — HYDROXYZINE HCL 10 MG PO TABS
10.0000 mg | ORAL_TABLET | Freq: Three times a day (TID) | ORAL | Status: DC | PRN
  Administered 2014-02-09: 10 mg via ORAL
  Filled 2014-02-09: qty 1

## 2014-02-09 MED ORDER — APIXABAN 2.5 MG PO TABS: 2.5000 mg | ORAL_TABLET | Freq: Two times a day (BID) | ORAL | Status: DC

## 2014-02-09 MED ORDER — LISINOPRIL 5 MG PO TABS
5.0000 mg | ORAL_TABLET | Freq: Every day | ORAL | Status: DC
  Filled 2014-02-09: qty 1

## 2014-02-09 MED ORDER — CARVEDILOL 3.125 MG PO TABS
3.1250 mg | ORAL_TABLET | Freq: Two times a day (BID) | ORAL | Status: DC
Start: 2014-02-09 — End: 2014-02-20

## 2014-02-09 MED ORDER — TORSEMIDE 10 MG PO TABS
10.0000 mg | ORAL_TABLET | Freq: Every day | ORAL | Status: DC
  Filled 2014-02-09: qty 1

## 2014-02-09 MED ORDER — PANTOPRAZOLE SODIUM 40 MG PO TBEC: 40.0000 mg | DELAYED_RELEASE_TABLET | Freq: Two times a day (BID) | ORAL | Status: DC

## 2014-02-09 MED ORDER — PANTOPRAZOLE SODIUM 40 MG PO PACK
40.0000 mg | PACK | Freq: Every day | ORAL | Status: DC
  Filled 2014-02-09: qty 20

## 2014-02-09 MED ORDER — SODIUM CHLORIDE 0.9 % IV SOLN: INTRAVENOUS | Status: AC

## 2014-02-09 MED ORDER — AMLODIPINE BESYLATE 10 MG PO TABS
10.0000 mg | ORAL_TABLET | Freq: Every day | ORAL | Status: DC
  Administered 2014-02-09 – 2014-02-10 (×2): 10 mg via ORAL
  Filled 2014-02-09 (×2): qty 1

## 2014-02-09 MED ORDER — ATORVASTATIN CALCIUM 40 MG PO TABS: 40.0000 mg | ORAL_TABLET | Freq: Every day | ORAL | Status: DC

## 2014-02-09 MED ORDER — METOPROLOL SUCCINATE ER 25 MG PO TB24
25.0000 mg | ORAL_TABLET | Freq: Every day | ORAL | Status: DC
  Administered 2014-02-09 – 2014-02-10 (×2): 25 mg via ORAL
  Filled 2014-02-09 (×2): qty 1

## 2014-02-09 NOTE — Progress Notes (Signed)
Speech Language Pathology Treatment: Dysphagia;Cognitive-Linquistic  Patient Details Name: [name] MRN: [id number] DOB: 30-Jan-1933 Today's Date: 02/09/2014 Time: 2542-7062 SLP Time Calculation (min): 40 min  Assessment / Plan / Recommendation Clinical Impression  Treatment focused on initiation, sustained attention and basic functional problem solving with self feeding. Pt requiring less cueing today, attending to right visual field with min cues and following verbal cues much more consistently. Pt also verbalizing in 75% of opportunities with min question cues. She was able to name familiar objects 5/5 and repeated words, phrases and sentences with linguistic accuracy but moderate dysarthria. She continues to hesitate prior to initiation. Responded to cues to verbalize wants and needs by requesting water. No evidence of aspiration and demonstrated ability to masticate dys 3 solids without pocketing. Continue Dys 3 (mech soft diet) with thin liquids ordered after EGD. Pt showing excellent functional gains. Would benefit from CIR.    HPI HPI: [name] is a 80 y.o. female, lives at home with her son with h/o nonischemic cardiomyopathy, chronic combined systolic and diastolic CHF, OSA, hypertension, type 2 diabetes mellitus, diabetic gastroparesis, dyslipidemia, hypertension, CVA in the past, GI bleed in 2012 due to erosive gastritis, who was recently admitted for CHF comes back to the hospital after feeling weak for the last few weeks, few falls at home, at baseline she uses a walker and has early dementia. In the ER she was found to be obtunded, workup was suggestive of dehydration with acute renal failure, CT head chest x-ray was unremarkable.   Pertinent Vitals NA  SLP Plan  Continue with current plan of care    Recommendations Diet recommendations: Dysphagia 3 (mechanical soft);Thin liquid Liquids provided via: Cup;Straw Medication Administration: Whole meds with puree Supervision:  Staff to assist with self feeding;Full supervision/cueing for compensatory strategies Compensations: Slow rate;Small sips/bites Postural Changes and/or Swallow Maneuvers: Seated upright 90 degrees;Upright 30-60 min after meal              General recommendations: Rehab consult Oral Care Recommendations: Oral care BID Follow up Recommendations: Inpatient Rehab Plan: Continue with current plan of care    GO    [facility], [name] CCC-SLP [phone]  [name] Carlas Hurley 02/09/2014, 9:44 AM

## 2014-02-09 NOTE — Progress Notes (Signed)
[facility] PROGRESS NOTE Interim History: 80 y.o. female, lives at home with her son with history of nonischemic cardiomyopathy, chronic combined systolic and diastolic CHF EF 75%, obstructive sleep apnea, hypertension, type 2 diabetes mellitus now insulin-dependent, diabetic gastroparesis, dyslipidemia, hypertension, CVA in the past, GI bleed in 2012 due to erosive gastritis, who was recently admitted for CHF and was discharged home on ACE inhibitor along with diuretic presented with an weakness, increased falls, melena. Just prior to admission she developed expressive aphasia, twisted lip, and lethargy. I suspect that she had some acute hypotensive event which may have been related to GI bleed which led to type II in standing, water shed infarcts, shock liver, and renal failure. Clinically, she has been improving. Cardiology was consult for her in STEMI. Nephrology follows for her acute renal failure. Her kidney function appears to be stabilizing and she is not a candidate for hemodialysis. Neurology was consulted after the report from the MRI returned. Gastroenterology is anticipating performing upper endoscopy on Wednesday 4/29.   Assessment/Plan: Acute stroke, possible watershed infarct vs. embolic strokes: - Speech and strength resolving. EEG negative for sz. ECHO and carotid duplex just completed a month ago.  - MRI: Multiple small bilateral infarcts in white matter  - EEG: Neg for epileptiform activity  - Neurology consult: Rec CIR - PT/OT/SLP:  - She is high risk repeated embolic stroke. Cannot use warfarin due to her bleeding risk. It was d/w with neurology an will restart ASA in 2 weeks. When her CrCl > 30mg /ml can use Eliquis for secondary stroke prevention.  Acute blood loss anemia: - Hemoccult-positive, hgb stable. Previous endoscopies for similar bleeding/anemia demonstrated erosive gastritis and normal colonoscopy.  - Protonix per GI  - Endoscopy on 4.29.2015.  NSTEMI,  likely type 2, with hx of known CAD:  - Given her comorbidities, particularly her renal failure, she is not a good candidate for left heart catheterization or invasive procedure. Sinus brady on telemetry.  - Per cardiology  - No ACE inhibitor secondary to acute renal failure  - Aspirin held because of concern for GIB  - Nitroglycerin when necessary.  ICM with EF 40-45% and grade 2 DD:  - no rales on exam  - Monitor for SOB  - BB okay, but ACEI on hold due to AKI.  HTN, blood pressure mildly elevated  - Continue BB, norvasc  - Per cardiology   Acute renal failure, baseline creatinine 1.09: - current creatinine clearance <15 and creatinine plateauing.  - No hydronephrosis on CT abdomen pelvis  - FENa 0.72%  - Given age and comorbidities, would likely not be a good candidate for hemodialysis  - Appreciate Nephrology assistance.  Abnl gallbladder with possible wall thickening on CT  - No abdominal pain on exam  - AST/ALT rising, bili wnl  - Lipase wnl   Mild transaminitis, likely shock liver from her acute event.  - Improving,  Repeat LFTs in a week   Anion gap metabolic acidosis:  may be secondary to elevated BUN. May also have some underlying RTA from renal failure, resolved with sodium bicarb fluids  - lactic acid 1.5.  DM2, A1c 11.5,  - hypoglycemic during initial day of hospitalization, now resolved.  - Low dose SSI   Hyponatremia, resolved with IVF  OSA, stable. Not on CPAP   Code Status: DNR  Family Communication: patient and extended family  Disposition Plan: To skilled nursing facility in one to 2 days with palliative care to follow  Consultants:  GI  Renal  Neurology  cardiology  Procedures:  Ct abd.  Ct head  MRI  CXR  Antibiotics:  none  HPI/Subjective: No complains  Objective: Filed Vitals:   02/09/14 0110 02/09/14 0537 02/09/14 0546 02/09/14 0723  BP:  145/60 178/51   Pulse:  50 51   Temp: 99.4 F (37.4 C)  98.5 F (36.9  C)   TempSrc: Axillary  Oral   Resp:   18   Height:      Weight:    74.5 kg (164 lb 3.9 oz)  SpO2:   100%     Intake/Output Summary (Last 24 hours) at 02/09/14 0945 Last data filed at 02/09/14 0727  Gross per 24 hour  Intake    100 ml  Output    775 ml  Net   -675 ml   Filed Weights   02/07/14 0526 02/08/14 0743 02/09/14 0723  Weight: 65.89 kg (145 lb 4.2 oz) 72.2 kg (159 lb 2.8 oz) 74.5 kg (164 lb 3.9 oz)    Exam:  General: Alert, awake, oriented x2, in no acute distress.  HEENT: No bruits, no goiter. NO JVD Heart: Regular rate and rhythm, without murmurs, rubs, gallops.  Lungs: Good air movement, clear Abdomen: Soft, nontender, nondistended, positive bowel sounds.    Data Reviewed: Basic Metabolic Panel:  Recent Labs Lab 02/05/14 0655 02/06/14 0510 02/07/14 0535 02/08/14 0458 02/09/14 0650  NA 133* 137 142 142 143  K 4.8 3.5* 3.6* 3.9 4.3  CL 99 98 104 103 106  CO2 12* 24 20 22 20   GLUCOSE 135* 62* 218* 256* 170*  BUN 74* 76* 74* 71* 68*  CREATININE 4.25* 4.67* 4.67* 4.52* 4.62*  CALCIUM 8.6 8.2* 8.2* 8.6 8.9   Liver Function Tests:  Recent Labs Lab 02/05/14 0655 02/06/14 0510 02/07/14 0535 02/08/14 0458 02/09/14 0650  AST 97* 126* 96* 40* 33  ALT 53* 89* 143* 110* 81*  ALKPHOS 93 95 86 85 84  BILITOT 1.2 1.1 0.9 0.9 0.9  PROT 6.8 6.0 5.9* 6.3 6.5  ALBUMIN 3.0* 2.7* 2.4* 2.6* 2.5*    Recent Labs Lab 02/05/14 1650  LIPASE 28    Recent Labs Lab 02/05/14 1650  AMMONIA 26   CBC:  Recent Labs Lab 02/04/14 1158  02/05/14 0655 02/05/14 1650 02/06/14 0510 02/07/14 0535 02/08/14 0458 02/09/14 0650  WBC 9.1  < > 9.2  --  8.9 8.7 8.9 8.3  NEUTROABS 6.3  --   --   --   --   --   --   --   HGB 6.2*  < > 9.1* 8.5* 8.3* 8.2* 8.3* 8.7*  HCT 19.9*  < > 27.5* 25.6* 25.0* 25.9* 26.4* 27.6*  MCV 65.0*  < > 70.5*  --  70.8* 72.5* 72.1* 72.4*  PLT 283  < > 145*  --  198 202 196 261  < > = values in this interval not displayed. Cardiac  Enzymes:  Recent Labs Lab 02/04/14 1810 02/05/14 0115 02/05/14 0655 02/05/14 1650 02/05/14 2330  TROPONINI <0.30 0.58* 0.83* 1.83* 1.55*   BNP (last 3 results)  Recent Labs  12/03/13 1808 12/05/13 0448  PROBNP 7767.0* 6023.0*   CBG:  Recent Labs Lab 02/08/14 1936 02/09/14 0129 02/09/14 0157 02/09/14 0346 02/09/14 0724  GLUCAP 165* 166* 176* 161* 73    Recent Results (from the past 240 hour(s))  MRSA PCR SCREENING     Status: None   Collection Time    02/05/14  8:03  AM      Result Value Ref Range Status   MRSA by PCR NEGATIVE  NEGATIVE Final   Comment:            The GeneXpert MRSA Assay (FDA     approved for NASAL specimens     only), is one component of a     comprehensive MRSA colonization     surveillance program. It is not     intended to diagnose MRSA     infection nor to guide or     monitor treatment for     MRSA infections.     Studies: No results found.  Scheduled Meds: . amLODipine  10 mg Oral Daily  . atorvastatin  40 mg Oral q1800  . insulin aspart  0-9 Units Subcutaneous Q4H  . isosorbide mononitrate  60 mg Oral Daily  . metoprolol succinate  25 mg Oral Daily  . multivitamin with minerals  1 tablet Oral Daily  . [START ON 02/10/2014] pantoprazole sodium  40 mg Per Tube Daily   Continuous Infusions: . sodium chloride       [facility] Pager [phone]. If 8PM-8AM, please contact night-coverage at [email or web address], password [facility] 02/09/2014, 9:45 AM  LOS: 5 days

## 2014-02-09 NOTE — Consult Note (Signed)
Physical Medicine and Rehabilitation Consult  Reason for Consult: Aphasia and right neglect.  Referring Physician:  Dr. Leonie Man.    HPI: [name] is a 80 y.o. female with h/o CAD, DM type 2 with gastroparesis, CHF exacerbation 11/2103, GIB, early dementia who has had recent increase in falls with melena. She was admitted on 04/25 /15 with expressive aphasia, lethargy as well as twisted lip.  She was found to be in acute renal failure and anemic with hgb-5.6. She was treated with IVF and transfused with 2 unit PRBC.  Patient with elevated cardiac enzyme that was felt to be multifactorial due to demand ischemia. 2 D echo with EF 40-45%.  Dr. Jonnie Finner consulted for input on acute on chronic renal failure and recommended d/c ace as well as hydration as patient poor HD candidate. Patient continued to be confused with fluctuating mentation and MRI brain done revealing multiple bilateral watershed infarcts. Carotid dopplers without ICA stenosis. Neurology recommended ASA if GIB resolved.  Dr. Carlean Purl consulted for input and EGD done on 02/08/14  Revealing erosive gastritis in gastric antrum. She's cleared to use low dose ASA in two weeks if needed.  Neurology feels that patient with embolic infarcts due to A Fib and Eliquis 2.5 mg bid recommended if CrCL >30. Patient with poor renal recovery and nephrology recommended palliative care to discuss [place]. EEG with evidence of mild encephalopathy. Today with improvement in mentation. Patient with subsequent right neglect, moderate dysarthria, problems with initiation, attention as well as aphasia.  MD, ST recommending CIR for progression.   Pt is pleasant, smiling, identifies daughter in room Review of Systems  Eyes: Negative for blurred vision.  Respiratory: Negative for shortness of breath.   Cardiovascular: Negative for chest pain.  Gastrointestinal: Negative for nausea, abdominal pain and constipation.  Musculoskeletal: Positive for falls  (occasionally due to BLE giving away.  ) and myalgias. Negative for back pain.  Neurological: Positive for speech change. Negative for dizziness.    Past Medical History  Diagnosis Date  . Asthma   . CAD (coronary artery disease)     Pt reports MI in 2006 (no documentation).  Cardiolite in 05/2002 and 07/2006 did not reveal any reversible ischemia.  Pt follows with Dr. Rex Kras at [facility].  . CHF (congestive heart failure)     EF 25-30% with dilated LV, mild LVH, severe hypokinesis, and mod-severe reduction in RV function  . Osteoporosis   . HYPERTENSION 08/03/2006  . GASTROPARESIS, DIABETIC 08/03/2006  . HYPERLIPIDEMIA 08/03/2006  . OBSTRUCTIVE SLEEP APNEA 01/06/2008  . PERIPHERAL NEUROPATHY 08/03/2006  . GERD 08/03/2006  . LOW BACK PAIN, CHRONIC 08/03/2006  . OSTEOPOROSIS 03/21/2009  . CEREBRAL EMBOLISM, WITH INFARCTION 07/02/2010  . Angina   . Myocardial infarction "2 or 3"  . Pneumonia 02/26/12    "a few times; probably even today"  . Shortness of breath     "all the time"  . DIABETES MELLITUS, TYPE II 11/04/1983  . Blood transfusion 08/2011  . Lower GI bleeding 08/2011 and 01/2014  . Chronic daily headache   . Migraines   . Stroke summer 2011    "made my left hip worse"  . Uterine cancer   . Nonischemic cardiomyopathy 05/2010    Left heart catheterization:2011. Nonobstructive coronary artery disease.  . Pulmonary hypertension   . Hypertension    Past Surgical History  Procedure Laterality Date  . Esophagogastroduodenoscopy  08/26/2011    Procedure: ESOPHAGOGASTRODUODENOSCOPY (EGD);  Surgeon: [name], MD;  Location: [facility] ENDOSCOPY;  Service: Endoscopy;  Laterality: N/A;  . Colonoscopy  08/28/2011    Procedure: COLONOSCOPY;  Surgeon: [name], MD;  Location: [facility];  Service: Endoscopy;  Laterality: N/A;  . Vaginal hysterectomy    . Tubal ligation    . Cataract extraction w/ intraocular lens  implant, bilateral    . Toe surgery      "right big toe; operated on  it to straighten it out; it was under"  . Polysomnogram  10/17/2005    AHI-7.28/hr. AHI REM-20.8/hr. Average oxygen saturation range during REM and NREM was 97%. Lowest oxygen saturation during REM sleep was 90%.  . Carotid duplex  05/28/2010    No significant extracranial carotid artery stenosis demonstrated. Vertebrals are patent w/ antegrade flow.  . Cardiac catheterization  05/24/2010    No intervention - recommed medical therapy.  . Cardiovascular stress test  08/07/2006    Moderate-severe defect seen in Basal inferior, Mid inferoseptal, Mid inferior, Mid inferolateral, and Apical inferior regions - consistent w/ infarct/scar. No scintigraphic evidence of inducible myocardial ischemia.  . Transthoracic echocardiogram  08/29/2011    EF 55-60%, moderate LVH,   . Esophagogastroduodenoscopy N/A 02/08/2014    Procedure: ESOPHAGOGASTRODUODENOSCOPY (EGD);  Surgeon: [name], MD;  Location: [facility];  Service: Endoscopy;  Laterality: N/A;   Family History  Problem Relation Age of Onset  . Diabetes insipidus Mother   . Hypertension Mother   . Hypertension Father   . Hypertension Sister   . Hypertension Child    Social History: Lives with family. Needed SBA when ambulating with walker. She  reports that she has never smoked. She quit smokeless tobacco use about 7 years ago. Her smokeless tobacco use included Snuff. She reports that she does not drink alcohol or use illicit drugs.  Allergies  Allergen Reactions  . Baking Soda-Fluoride [Sodium Fluoride] Nausea And Vomiting  . Magnesium Hydroxide Nausea And Vomiting   Medications Prior to Admission  Medication Sig Dispense Refill  . acetaminophen (TYLENOL) 500 MG tablet Take 500 mg by mouth every 6 (six) hours as needed for moderate pain or headache.      . albuterol (PROAIR HFA) 108 (90 BASE) MCG/ACT inhaler Inhale 2 puffs into the lungs every 6 (six) hours as needed for wheezing.  8.5 g  11  . amLODipine (NORVASC) 10 MG tablet Take 1  tablet (10 mg total) by mouth daily.  30 tablet  2  . aspirin 81 MG EC tablet Take 81 mg by mouth daily.        . insulin aspart (NOVOLOG FLEXPEN) 100 UNIT/ML FlexPen Inject 7 Units into the skin daily with breakfast.       . insulin glargine (LANTUS) 100 UNIT/ML injection Inject 4-34 Units into the skin 2 (two) times daily. Inject 4 units at dinner and 34 units at bedtime      . isosorbide mononitrate (IMDUR) 60 MG 24 hr tablet Take 1 tablet (60 mg total) by mouth daily.  30 tablet  2  . lisinopril (PRINIVIL,ZESTRIL) 5 MG tablet Take 5 mg by mouth daily.      Marland Kitchen loratadine (ALLERGY RELIEF) 10 MG tablet Take 10 mg by mouth daily.      . metoprolol succinate (TOPROL-XL) 25 MG 24 hr tablet Take 1 tablet (25 mg total) by mouth daily.  30 tablet  0  . Multiple Vitamins-Minerals (ALIVE WOMENS 50+ PO) Take 1 tablet by mouth daily.      . nitroGLYCERIN (NITROSTAT) 0.4  MG SL tablet Place 1 tablet (0.4 mg total) under the tongue every 5 (five) minutes as needed. For chest pain.  30 tablet  3  . polyethylene glycol (MIRALAX / GLYCOLAX) packet Take 17 g by mouth daily.      . potassium chloride (K-DUR) 10 MEQ tablet Take 10 mEq by mouth daily.      . rosuvastatin (CRESTOR) 20 MG tablet Take 1 tablet (20 mg total) by mouth daily.  30 tablet  2  . torsemide (DEMADEX) 10 MG tablet Take 10 mg by mouth daily.      . BD INSULIN SYRINGE ULTRAFINE 31G X 5/16" 0.3 ML MISC         Home: Home Living Family/patient expects to be discharged to:: Skilled nursing facility Living Arrangements: Children Available Help at Discharge: Family  Lives With: Son  Functional History: Prior Function Level of Independence: Independent with assistive device(s) Comments: was modified independent wiht ADLs and with RW until recently, with falls leading up to this admission Functional Status:  Mobility: Bed Mobility Overal bed mobility: +2 for physical assistance Bed Mobility: Supine to Sit;Sit to Supine Supine to sit: Max  assist;+2 for physical assistance Sit to supine: Total assist;+2 for physical assistance General bed mobility comments: cues for sequencing & technique.  (A) to lift shoulders/trunk to sitting upright, pivot hips around to EOB, & to lift LE's back into bed.   Transfers Overall transfer level: Needs assistance Transfers: Sit to/from Stand;Stand Pivot Transfers Sit to Stand: Max assist;+2 safety/equipment Stand pivot transfers: Max assist General transfer comment: cues for sequencing & technique.  1st trial of SPT bed>3-in-1 +2 was used & 2nd trial performed with patient-therapist face-to-face positioning.        ADL: ADL Overall ADL's : Needs assistance/impaired Grooming: Wash/dry face;Wash/dry hands;Minimal assistance Upper Body Bathing: Moderate assistance;Sitting Lower Body Bathing: Total assistance Upper Body Dressing : Moderate assistance;Sitting Lower Body Dressing: Total assistance Toilet Transfer: Maximal assistance;Cueing for safety;Cueing for sequencing;Stand-pivot Toileting- Clothing Manipulation and Hygiene: Total assistance Functional mobility during ADLs: Maximal assistance;Cueing for safety;Cueing for sequencing  Cognition: Cognition Overall Cognitive Status: Impaired/Different from baseline Arousal/Alertness: Awake/alert Orientation Level: Oriented to person;Disoriented to time;Disoriented to place;Disoriented to situation Attention: Focused;Sustained Focused Attention: Impaired Focused Attention Impairment: Functional basic;Verbal basic Sustained Attention: Impaired Sustained Attention Impairment: Verbal basic;Functional basic Memory:  (NT) Awareness: Impaired Awareness Impairment: Intellectual impairment;Emergent impairment Problem Solving: Impaired Problem Solving Impairment: Verbal basic;Functional basic Behaviors: Perseveration Safety/Judgment: Impaired Cognition Arousal/Alertness: Awake/alert Behavior During Therapy: Flat affect Overall Cognitive  Status: Impaired/Different from baseline Area of Impairment: Attention;Following commands;Problem solving Current Attention Level: Focused Following Commands: Follows one step commands inconsistently;Follows one step commands with increased time Problem Solving: Slow processing;Decreased initiation;Difficulty sequencing;Requires verbal cues;Requires tactile cues  Blood pressure 150/55, pulse 51, temperature 98.5 F (36.9 C), temperature source Oral, resp. rate 18, height 5\' 2"  (1.575 m), weight 74.5 kg (164 lb 3.9 oz), last menstrual period 12/19/1968, SpO2 100.00%. Physical Exam  Nursing note and vitals reviewed. Constitutional: She appears well-developed and well-nourished.  HENT:  Head: Normocephalic and atraumatic.  Eyes: Conjunctivae are normal. Pupils are equal, round, and reactive to light.  Neck: Normal range of motion. Neck supple.  Cardiovascular: Normal rate and regular rhythm.   No murmur heard. Respiratory: Effort normal and breath sounds normal. No respiratory distress. She has no wheezes.  GI: Soft. Bowel sounds are normal. She exhibits no distension.  Musculoskeletal: She exhibits no tenderness.  Neurological: She is alert.  Oriented to self and place. Was  able to ID family members and state DOB but not age. Unable to follow two step commands. Impulsive and needs occasional redirection. RUE inattention.   Skin: Skin is warm and dry.  needs cues for limited orientation Motor strength 4/5 in bilateral delt bi tri grip HF KE ADF Sensory is difficult to assess secondary to cognition but does correctly identify light touch on both feet No ataxia noted FNF   Results for orders placed during the hospital encounter of 02/04/14 (from the past 24 hour(s))  GLUCOSE, CAPILLARY     Status: Abnormal   Collection Time    02/08/14  1:06 PM      Result Value Ref Range   Glucose-Capillary 153 (*) 70 - 99 mg/dL   Comment 1 Documented in Chart     Comment 2 Notify RN    GLUCOSE,  CAPILLARY     Status: Abnormal   Collection Time    02/08/14  4:38 PM      Result Value Ref Range   Glucose-Capillary 153 (*) 70 - 99 mg/dL   Comment 1 Documented in Chart     Comment 2 Notify RN    GLUCOSE, CAPILLARY     Status: Abnormal   Collection Time    02/08/14  7:36 PM      Result Value Ref Range   Glucose-Capillary 165 (*) 70 - 99 mg/dL  GLUCOSE, CAPILLARY     Status: Abnormal   Collection Time    02/09/14  1:29 AM      Result Value Ref Range   Glucose-Capillary 166 (*) 70 - 99 mg/dL   Comment 1 Notify RN    GLUCOSE, CAPILLARY     Status: Abnormal   Collection Time    02/09/14  1:57 AM      Result Value Ref Range   Glucose-Capillary 176 (*) 70 - 99 mg/dL   Comment 1 Notify RN     Comment 2 Documented in Chart    GLUCOSE, CAPILLARY     Status: Abnormal   Collection Time    02/09/14  3:46 AM      Result Value Ref Range   Glucose-Capillary 161 (*) 70 - 99 mg/dL  COMPREHENSIVE METABOLIC PANEL     Status: Abnormal   Collection Time    02/09/14  6:50 AM      Result Value Ref Range   Sodium 143  137 - 147 mEq/L   Potassium 4.3  3.7 - 5.3 mEq/L   Chloride 106  96 - 112 mEq/L   CO2 20  19 - 32 mEq/L   Glucose, Bld 170 (*) 70 - 99 mg/dL   BUN 68 (*) 6 - 23 mg/dL   Creatinine, Ser 4.62 (*) 0.50 - 1.10 mg/dL   Calcium 8.9  8.4 - 10.5 mg/dL   Total Protein 6.5  6.0 - 8.3 g/dL   Albumin 2.5 (*) 3.5 - 5.2 g/dL   AST 33  0 - 37 U/L   ALT 81 (*) 0 - 35 U/L   Alkaline Phosphatase 84  39 - 117 U/L   Total Bilirubin 0.9  0.3 - 1.2 mg/dL   GFR calc non Af Amer 8 (*) >90 mL/min   GFR calc Af Amer 10 (*) >90 mL/min  CBC     Status: Abnormal   Collection Time    02/09/14  6:50 AM      Result Value Ref Range   WBC 8.3  4.0 - 10.5 K/uL   RBC 3.81 (*)  3.87 - 5.11 MIL/uL   Hemoglobin 8.7 (*) 12.0 - 15.0 g/dL   HCT 27.6 (*) 36.0 - 46.0 %   MCV 72.4 (*) 78.0 - 100.0 fL   MCH 22.8 (*) 26.0 - 34.0 pg   MCHC 31.5  30.0 - 36.0 g/dL   RDW 23.1 (*) 11.5 - 15.5 %   Platelets 261   150 - 400 K/uL  LIPID PANEL     Status: None   Collection Time    02/09/14  6:50 AM      Result Value Ref Range   Cholesterol 130  0 - 200 mg/dL   Triglycerides 61  <150 mg/dL   HDL 60  >39 mg/dL   Total CHOL/HDL Ratio 2.2     VLDL 12  0 - 40 mg/dL   LDL Cholesterol 58  0 - 99 mg/dL  GLUCOSE, CAPILLARY     Status: None   Collection Time    02/09/14  7:24 AM      Result Value Ref Range   Glucose-Capillary 73  70 - 99 mg/dL  GLUCOSE, CAPILLARY     Status: Abnormal   Collection Time    02/09/14 10:51 AM      Result Value Ref Range   Glucose-Capillary 262 (*) 70 - 99 mg/dL   Comment 1 Notify RN     No results found.  Assessment/Plan: Diagnosis: Bilateral cerebral watershed infarcts related to acute anemia from GIB 1. Does the need for close, 24 hr/day medical supervision in concert with the patient's rehab needs make it unreasonable for this patient to be served in a less intensive setting? Yes 2. Co-Morbidities requiring supervision/potential complications: AFib, DM,ARF 3. Due to bladder management, bowel management, safety, skin/wound care, disease management, medication administration and patient education, does the patient require 24 hr/day rehab nursing? Yes 4. Does the patient require coordinated care of a physician, rehab nurse, PT (1-2 hrs/day, 5 days/week), OT (1-2 hrs/day, 5 days/week) and SLP (.5-1 hrs/day, 5 days/week) to address physical and functional deficits in the context of the above medical diagnosis(es)? Yes Addressing deficits in the following areas: balance, endurance, locomotion, strength, transferring, bowel/bladder control, bathing, dressing, feeding, grooming, toileting, cognition and language 5. Can the patient actively participate in an intensive therapy program of at least 3 hrs of therapy per day at least 5 days per week? Yes 6. The potential for patient to make measurable gains while on inpatient rehab is good 7. Anticipated functional outcomes upon  discharge from inpatient rehab are supervision  with PT, supervision with OT, supervision with SLP. 8. Estimated rehab length of stay to reach the above functional goals is: 14-18 days 9. Does the patient have adequate social supports to accommodate these discharge functional goals? Yes 10. Anticipated D/C setting: Home 11. Anticipated post D/C treatments: [place] therapy 12. Overall Rehab/Functional Prognosis: good  RECOMMENDATIONS: This patient's condition is appropriate for continued rehabilitative care in the following setting: CIR Patient has agreed to participate in recommended program. Yes Note that insurance prior authorization may be required for reimbursement for recommended care.  Comment: Was at Supervision level PTA    02/09/2014

## 2014-02-09 NOTE — Progress Notes (Signed)
Another CSW referral received today to assist with SNF placement and now CIR ordered.  Patient has a bed at [facility] and planned d/c today there if stable.  CSW reviewed patient with Danne Baxter, RN Liason for CIR. She stated that awaiting CIR consult order and then will evaluate patient. Dr. Venetia Constable is aware and will order.  CSW will continue to follow and assist with d/c plan as indicated.  Lorie Phenix. [place], [place]

## 2014-02-09 NOTE — Discharge Summary (Addendum)
Physician Discharge Summary  DELAYZA LUNGREN YNW:[id number] DOB: 1933/03/09 DOA: 02/04/2014  PCP: [name], MD  Admit date: 02/04/2014 Discharge date: 02/10/2014  Time spent: 40 minutes  Recommendations for Outpatient Follow-up:  1. Follow up with PCP in 2 week, check CBC.  2. Resume ASA in 2 week. When Cr improve with GFR > 30 ml/min can be started on Eliquis. 3. Transfer to CIR. 4. Palliative to meet with family at facility  Discharge Diagnoses:  Principal Problem:   Acute bilat watershed infarction vs. embolic strokes Active Problems:   DM (diabetes mellitus), type 2, uncontrolled   GASTROPARESIS, DIABETIC   HYPERLIPIDEMIA   ANEMIA   OBSTRUCTIVE SLEEP APNEA   HYPERTENSION   CEREBRAL EMBOLISM, WITH INFARCTION   GERD   DEGENERATIVE JOINT DISEASE   CAD (coronary artery disease)   GI bleed   ARF (acute renal failure)   Nonischemic cardiomyopathy   Chronic diastolic CHF EF 65%   Non-ST elevation MI (NSTEMI)   Renal failure, acute   NSTEMI (non-ST elevated myocardial infarction)   Erosive gastritis with hemorrhage   Acute embolic stroke   Discharge Condition: guarded  Diet recommendation: carb modified low sodium fluid restricted. Dysphagia 2 diet.  Filed Weights   02/08/14 0743 02/09/14 0723 02/10/14 0444  Weight: 72.2 kg (159 lb 2.8 oz) 74.5 kg (164 lb 3.9 oz) 75.841 kg (167 lb 3.2 oz)    History of present illness:  80 y.o. female, lives at home with her son with history of nonischemic cardiomyopathy, chronic combined systolic and diastolic CHF EF 78% this year, obstructive sleep apnea, hypertension, type 2 diabetes mellitus now insulin-dependent, diabetic gastroparesis, dyslipidemia, hypertension, CVA in the past, GI bleed in 2012 due to erosive gastritis, who was recently admitted for CHF and was discharged home on ACE inhibitor along with diuretic comes to the hospital after feeling weak for the last few weeks, few falls at home, at baseline she uses a walker and  has early dementia. Since yesterday she has been increasingly confused and less responsive, EMS brought her to the ER.   Hospital Course:  Acute stroke, possible watershed infarct vs. embolic strokes:  - Place NPO consulted speech which rec a Dys 2 diet. - EEG negative for sz. ECHO and carotid duplex just completed a month ago.  - MRI: Multiple small bilateral infarcts in white matter  - EEG: Neg for epileptiform activity  - Neurology consult who recommended ASA, Due to renal failure NOCA's cannot be used. Will have to be restarted as an outpatient. When her Cr return to baseline. Not a candidate for coumadin as has had GI bleeds in the past. - PT/OT/SLP: CIR - Not on antiplatelet medication secondary to acute GIB.   Acute blood loss anemia:  - Hemoccult-positive, hgb stable.  - Previous endoscopies for similar bleeding/anemia demonstrated erosive gastritis and normal colonoscopy.  - Protonix BID - Repeat Endoscopy on 4.29.2015. Showed erosive esophigitis.  NSTEMI, likely type 2, with hx of known CAD:  - consult cardiology - Given her comorbidities, particularly her renal failure, she is not a good candidate for left heart catheterization or invasive procedure. Sinus brady on telemetry.  - No ACE inhibitor secondary to acute renal failure  - Aspirin held because of concern for GIB. Resume ASA in 2 weeks. - Nitroglycerin when necessary.   ICM with EF 40-45% and grade 2 DD:  - no rales on exam  - Monitor for SOB  - BB okay, but ACEI on hold due to AKI.  HTN, blood pressure mildly elevated  - Continue BB, norvasc  - Per cardiology   Acute renal failure, baseline creatinine 1.09:  - current creatinine clearance <15 and creatinine trending down. Repeat a b-met on 5.4.2015. - No hydronephrosis on CT abdomen pelvis  - FENa 0.72% on admission. - Given age and comorbidities, would likely not be a good candidate for hemodialysis  - Appreciate Nephrology assistance.   Abnl gallbladder  with possible wall thickening on CT  - No abdominal pain on exam  - AST/ALT high now improving, bili wnl  - Lipase wnl   Mild transaminitis, likely shock liver from her acute event.  - Improving, Repeat LFTs in a week.  Anion gap metabolic acidosis:  - may be secondary to elevated BUN.  - May also have some underlying RTA from renal failure, resolved with sodium bicarb fluids  - lactic acid 1.5 on admission.  DM2, A1c 11.5,  - hypoglycemic during initial day of hospitalization, now resolved.  - Low dose SSI.  Hyponatremia, resolved with IVF  OSA, stable. Not on CPAP  Procedures:  CT abd  MRI of brain  CXR  CT head  Consultations:  Renal  Neurology  Discharge Exam: Filed Vitals:   02/10/14 0443  BP: 135/51  Pulse: 62  Temp: 97.9 F (36.6 C)  Resp: 19    General: A&O x3 Cardiovascular: RRR Respiratory: good air movement CTA B/L  Discharge Instructions You were cared for by a hospitalist during your hospital stay. If you have any questions about your discharge medications or the care you received while you were in the hospital after you are discharged, you can call the unit and asked to speak with the hospitalist on call if the hospitalist that took care of you is not available. Once you are discharged, your primary care physician will handle any further medical issues. Please note that NO REFILLS for any discharge medications will be authorized once you are discharged, as it is imperative that you return to your primary care physician (or establish a relationship with a primary care physician if you do not have one) for your aftercare needs so that they can reassess your need for medications and monitor your lab values.      Discharge Orders   Future Orders Complete By Expires   Diet - low sodium heart healthy  As directed    Increase activity slowly  As directed        Medication List         acetaminophen 500 MG tablet  Commonly known as:  TYLENOL   Take 500 mg by mouth every 6 (six) hours as needed for moderate pain or headache.     albuterol 108 (90 BASE) MCG/ACT inhaler  Commonly known as:  PROAIR HFA  Inhale 2 puffs into the lungs every 6 (six) hours as needed for wheezing.     ALIVE WOMENS 50+ PO  Take 1 tablet by mouth daily.     ALLERGY RELIEF 10 MG tablet  Generic drug:  loratadine  Take 10 mg by mouth daily.     amLODipine 10 MG tablet  Commonly known as:  NORVASC  Take 1 tablet (10 mg total) by mouth daily.     aspirin 81 MG EC tablet  Take 81 mg by mouth daily.     atorvastatin 40 MG tablet  Commonly known as:  LIPITOR  Take 1 tablet (40 mg total) by mouth daily at 6 PM.     BD INSULIN  SYRINGE ULTRAFINE 31G X 5/16" 0.3 ML Misc  Generic drug:  Insulin Syringe-Needle U-100     carvedilol 3.125 MG tablet  Commonly known as:  COREG  Take 1 tablet (3.125 mg total) by mouth 2 (two) times daily with a meal.     insulin glargine 100 UNIT/ML injection  Commonly known as:  LANTUS  Inject 4-34 Units into the skin 2 (two) times daily. Inject 4 units at dinner and 34 units at bedtime     isosorbide mononitrate 60 MG 24 hr tablet  Commonly known as:  IMDUR  Take 1 tablet (60 mg total) by mouth daily.     lisinopril 5 MG tablet  Commonly known as:  PRINIVIL,ZESTRIL  Take 5 mg by mouth daily.     metoprolol succinate 25 MG 24 hr tablet  Commonly known as:  TOPROL-XL  Take 1 tablet (25 mg total) by mouth daily.     nitroGLYCERIN 0.4 MG SL tablet  Commonly known as:  NITROSTAT  Place 1 tablet (0.4 mg total) under the tongue every 5 (five) minutes as needed. For chest pain.     NOVOLOG FLEXPEN 100 UNIT/ML FlexPen  Generic drug:  insulin aspart  Inject 7 Units into the skin daily with breakfast.     pantoprazole 40 MG tablet  Commonly known as:  PROTONIX  Take 1 tablet (40 mg total) by mouth 2 (two) times daily.     polyethylene glycol packet  Commonly known as:  MIRALAX / GLYCOLAX  Take 17 g by mouth daily.      potassium chloride 10 MEQ tablet  Commonly known as:  K-DUR  Take 10 mEq by mouth daily.     rosuvastatin 20 MG tablet  Commonly known as:  CRESTOR  Take 1 tablet (20 mg total) by mouth daily.     torsemide 10 MG tablet  Commonly known as:  DEMADEX  Take 10 mg by mouth daily.       Allergies  Allergen Reactions  . Baking Soda-Fluoride [Sodium Fluoride] Nausea And Vomiting  . Magnesium Hydroxide Nausea And Vomiting   Follow-up Information   Follow up with [name], MD On 02/16/2014. ($RemoveBefor'@3'CpPgNSpdMHXR$ :00 pm spoke with Verdene Lennert )    Specialty:  Family Medicine   Contact information:   [address] [place] [address] [phone]        The results of significant diagnostics from this hospitalization (including imaging, microbiology, ancillary and laboratory) are listed below for reference.    Significant Diagnostic Studies: Ct Abdomen Pelvis Wo Contrast  02/05/2014   CLINICAL DATA:  Acute renal failure.  Anemia.  EXAM: CT ABDOMEN AND PELVIS WITHOUT CONTRAST  TECHNIQUE: Multidetector CT imaging of the abdomen and pelvis was performed following the standard protocol without intravenous contrast.  COMPARISON:  None.  FINDINGS: Trace right-sided pleural fluid is noted. Minimal bibasilar atelectasis is seen. Diffuse coronary artery calcifications are noted.  Evaluation is mildly suboptimal due to motion artifact.  The liver and spleen are unremarkable in appearance. There is haziness about the gallbladder, which could reflect gallbladder wall thickening or could be artifactual in nature. Would correlate for associated symptoms. The pancreas and adrenal glands are unremarkable.  Nonspecific perinephric stranding is noted bilaterally. The kidneys are otherwise unremarkable in appearance. There is no evidence of hydronephrosis. No renal or ureteral stones are seen.  No free fluid is identified. The small bowel is unremarkable in appearance. The stomach is within normal limits. No acute  vascular abnormalities are  seen. Diffuse calcification is seen along the abdominal aorta and its branches, including along the superior mesenteric artery.  The appendix is not definitely characterized; there is no evidence for appendicitis. The colon is unremarkable in appearance.  The bladder is decompressed with a Foley catheter in place. The patient is status post hysterectomy. No suspicious adnexal masses are seen. No inguinal lymphadenopathy is seen.  No acute osseous abnormalities are identified. There is near-complete loss of the joint space at the left hip, with diffuse subcortical cystic change and cortical irregularity. Facet disease is noted at the lower lumbar spine.  IMPRESSION: 1. Haziness about the gallbladder could reflect gallbladder wall thickening, or could be artifactual in nature. Evaluation is mildly suboptimal due to motion artifact. Would correlate for associated symptoms. 2. No evidence of hydronephrosis; kidneys grossly unremarkable in appearance. 3. Diffuse calcification along the abdominal aorta and its branches, including along the superior mesenteric artery. 4. Diffuse coronary artery calcifications seen. 5. Trace right-sided pleural fluid noted. 6. Degenerative change noted at the left hip.   Electronically Signed   By: Garald Balding M.D.   On: 02/05/2014 03:17   Dg Chest 2 View  02/04/2014   CLINICAL DATA:  Altered mental status, history of myocardial infarction and pneumonia  EXAM: CHEST  2 VIEW  COMPARISON:  DG CHEST 1V PORT dated 12/03/2013  FINDINGS: Moderate enlargement of cardiac silhouette. Vascular pattern is normal. No consolidation or effusion.  IMPRESSION: Moderate enlargement of the cardiac silhouette.  Lungs clear.   Electronically Signed   By: Skipper Cliche M.D.   On: 02/04/2014 12:18   Ct Head Wo Contrast  02/04/2014   CLINICAL DATA:  Altered mental status, weakness  EXAM: CT HEAD WITHOUT CONTRAST  TECHNIQUE: Contiguous axial images were obtained from the base  of the skull through the vertex without intravenous contrast.  COMPARISON:  None.  FINDINGS: No evidence of parenchymal hemorrhage or extra-axial fluid collection. No mass lesion, mass effect, or midline shift.  No CT evidence of acute infarction.  Subcortical white matter and periventricular small vessel ischemic changes. Intracranial atherosclerosis.  Age related atrophy.  No ventriculomegaly.  Partial opacification of the right frontal sinus. Visualized paranasal sinuses and mastoid air cells are otherwise clear.  No evidence of calvarial fracture.  IMPRESSION: No evidence of acute intracranial abnormality.  Age related atrophy with small vessel ischemic changes and intracranial atherosclerosis.   Electronically Signed   By: Julian Hy M.D.   On: 02/04/2014 12:44   Mr Brain Wo Contrast  02/07/2014   CLINICAL DATA:  80 year old female with slurred speech. Initial encounter.  EXAM: MRI HEAD WITHOUT CONTRAST  TECHNIQUE: Multiplanar, multiecho pulse sequences of the brain and surrounding structures were obtained without intravenous contrast.  COMPARISON:  Head CT without contrast 03/06/2014.  FINDINGS: The examination had to be discontinued prior to completion due to patient claustrophobia, the referring provider did not wish to utilized sedation or anxiolysis for the patient at this time.  Axial and coronal diffusion weighted imaging was obtained, and is motion degraded despite repeated imaging attempts.  Multiple small (up to 8 mm) foci of restricted diffusion in the bilateral cerebral white matter. Corona radiata have fully affected on the left. There may be some cortical involvement of the right cingulate it (series 3, image 16) cough otherwise cortex is spared. Bilateral hemisphere involvement. No involvement of the deep gray matter nuclei or posterior fossa identified.  No ventriculomegaly or intracranial mass effect.  No midline shift.  IMPRESSION: Truncated and  motion degraded exam demonstrating  multiple foci of abnormal signal primarily in the bilateral cerebral white matter.  Top differential considerations are acute watershed ischemia (such as due to recent hypotensive event) and sequelae of embolic event (although no posterior fossa involvement identified).  Study discussed by telephone with Dr. Noah Delaine SHORT on 02/07/2014 at 1014 hours .   Electronically Signed   By: Lars Pinks M.D.   On: 02/07/2014 10:14    Microbiology: Recent Results (from the past 240 hour(s))  MRSA PCR SCREENING     Status: None   Collection Time    02/05/14  8:03 AM      Result Value Ref Range Status   MRSA by PCR NEGATIVE  NEGATIVE Final   Comment:            The GeneXpert MRSA Assay (FDA     approved for NASAL specimens     only), is one component of a     comprehensive MRSA colonization     surveillance program. It is not     intended to diagnose MRSA     infection nor to guide or     monitor treatment for     MRSA infections.     Labs: Basic Metabolic Panel:  Recent Labs Lab 02/06/14 0510 02/07/14 0535 02/08/14 0458 02/09/14 0650 02/10/14 0630  NA 137 142 142 143 140  K 3.5* 3.6* 3.9 4.3 4.0  CL 98 104 103 106 102  CO2 $Re'24 20 22 20 23  'JYk$ GLUCOSE 62* 218* 256* 170* 176*  BUN 76* 74* 71* 68* 62*  CREATININE 4.67* 4.67* 4.52* 4.62* 4.42*  CALCIUM 8.2* 8.2* 8.6 8.9 8.9   Liver Function Tests:  Recent Labs Lab 02/06/14 0510 02/07/14 0535 02/08/14 0458 02/09/14 0650 02/10/14 0630  AST 126* 96* 40* 33 20  ALT 89* 143* 110* 81* 66*  ALKPHOS 95 86 85 84 85  BILITOT 1.1 0.9 0.9 0.9 0.7  PROT 6.0 5.9* 6.3 6.5 6.7  ALBUMIN 2.7* 2.4* 2.6* 2.5* 2.7*    Recent Labs Lab 02/05/14 1650  LIPASE 28    Recent Labs Lab 02/05/14 1650  AMMONIA 26   CBC:  Recent Labs Lab 02/04/14 1158  02/05/14 0655 02/05/14 1650 02/06/14 0510 02/07/14 0535 02/08/14 0458 02/09/14 0650  WBC 9.1  < > 9.2  --  8.9 8.7 8.9 8.3  NEUTROABS 6.3  --   --   --   --   --   --   --   HGB 6.2*  < >  9.1* 8.5* 8.3* 8.2* 8.3* 8.7*  HCT 19.9*  < > 27.5* 25.6* 25.0* 25.9* 26.4* 27.6*  MCV 65.0*  < > 70.5*  --  70.8* 72.5* 72.1* 72.4*  PLT 283  < > 145*  --  198 202 196 261  < > = values in this interval not displayed. Cardiac Enzymes:  Recent Labs Lab 02/04/14 1810 02/05/14 0115 02/05/14 0655 02/05/14 1650 02/05/14 2330  TROPONINI <0.30 0.58* 0.83* 1.83* 1.55*   BNP: BNP (last 3 results)  Recent Labs  12/03/13 1808 12/05/13 0448  PROBNP 7767.0* 6023.0*   CBG:  Recent Labs Lab 02/09/14 1619 02/09/14 2047 02/10/14 0042 02/10/14 0441 02/10/14 0728  GLUCAP 276* 302* 180* 191* 162*    Signed:  Charlynne Cousins  [facility] 02/10/2014, 8:55 AM

## 2014-02-09 NOTE — Progress Notes (Signed)
Stroke Team Progress Note  HISTORY [name] is an 80 y.o. female presented with an weakness, increased falls, melena. Just prior to admission she developed expressive aphasia, twisted lip, and lethargy. While in hospital she was found to have acute blood loss anemia, Hemoccult-positive, hgb drifting down. Previous endoscopies for similar bleeding/anemia demonstrated erosive gastritis. MRI was obtained showing multiple foci of abnormal signal primarily in the bilateral cerebral white matter consistent with watershed infarct. Currently she is not on antiplatelet due to GI bleed and scheduled for EGD tomorrow.  Neuro work up while in hospital showed normal EEG, recent carotid doppler 12/28/13 showing no ICA stenosis and Echo on 12/28/13 showing EF 40-45% with no mention of PFO. Of note--family is going to have a palliative care meeting. last known well unable to determined. Patient was not administered TPA secondary to unknown time of onset.   SUBJECTIVE Family and ST at bedside. Patient sitting up in the bed.  OBJECTIVE Most recent Vital Signs: Filed Vitals:   02/09/14 0110 02/09/14 0537 02/09/14 0546 02/09/14 0723  BP:  145/60 178/51   Pulse:  50 51   Temp: 99.4 F (37.4 C)  98.5 F (36.9 C)   TempSrc: Axillary  Oral   Resp:   18   Height:      Weight:    74.5 kg (164 lb 3.9 oz)  SpO2:   100%    CBG (last 3)   Recent Labs  02/09/14 0157 02/09/14 0346 02/09/14 0724  GLUCAP 176* 161* 73    IV Fluid Intake:   . sodium chloride Stopped (02/09/14 0743)    MEDICATIONS  . amLODipine  10 mg Oral Daily  . atorvastatin  40 mg Oral q1800  . insulin aspart  0-9 Units Subcutaneous Q4H  . isosorbide mononitrate  60 mg Oral Daily  . lisinopril  5 mg Oral Daily  . metoprolol succinate  25 mg Oral Daily  . multivitamin with minerals  1 tablet Oral Daily  . [START ON 02/10/2014] pantoprazole sodium  40 mg Per Tube Daily  . torsemide  10 mg Oral Daily   PRN:  acetaminophen, feeding  supplement (GLUCERNA SHAKE), hydrALAZINE, nitroGLYCERIN, ondansetron (ZOFRAN) IV, ondansetron  Diet:  Carb Control thin liquids Activity:  Up with assistance DVT Prophylaxis:  SCDs   CLINICALLY SIGNIFICANT STUDIES Basic Metabolic Panel:   Recent Labs Lab 02/08/14 0458 02/09/14 0650  NA 142 143  K 3.9 4.3  CL 103 106  CO2 22 20  GLUCOSE 256* 170*  BUN 71* 68*  CREATININE 4.52* 4.62*  CALCIUM 8.6 8.9   Liver Function Tests:   Recent Labs Lab 02/08/14 0458 02/09/14 0650  AST 40* 33  ALT 110* 81*  ALKPHOS 85 84  BILITOT 0.9 0.9  PROT 6.3 6.5  ALBUMIN 2.6* 2.5*   CBC:  Recent Labs Lab 02/04/14 1158  02/08/14 0458 02/09/14 0650  WBC 9.1  < > 8.9 8.3  NEUTROABS 6.3  --   --   --   HGB 6.2*  < > 8.3* 8.7*  HCT 19.9*  < > 26.4* 27.6*  MCV 65.0*  < > 72.1* 72.4*  PLT 283  < > 196 261  < > = values in this interval not displayed. Coagulation: No results found for this basename: LABPROT, INR,  in the last 168 hours Cardiac Enzymes:   Recent Labs Lab 02/05/14 0655 02/05/14 1650 02/05/14 2330  TROPONINI 0.83* 1.83* 1.55*   Urinalysis:   Recent Labs Lab 02/04/14 1203  COLORURINE YELLOW  LABSPEC 1.018  PHURINE 5.0  GLUCOSEU NEGATIVE  HGBUR NEGATIVE  BILIRUBINUR NEGATIVE  KETONESUR NEGATIVE  PROTEINUR 100*  UROBILINOGEN 0.2  NITRITE NEGATIVE  LEUKOCYTESUR NEGATIVE   Lipid Panel    Component Value Date/Time   CHOL 130 02/09/2014 0650   TRIG 61 02/09/2014 0650   HDL 60 02/09/2014 0650   CHOLHDL 2.2 02/09/2014 0650   VLDL 12 02/09/2014 0650   LDLCALC 58 02/09/2014 0650   HgbA1C  Lab Results  Component Value Date   HGBA1C 11.5* 02/04/2014    Urine Drug Screen:     Component Value Date/Time   LABOPIA NONE DETECTED 07/10/2008 1721   COCAINSCRNUR NONE DETECTED 07/10/2008 1721   LABBENZ NONE DETECTED 07/10/2008 1721   AMPHETMU NONE DETECTED 07/10/2008 1721   THCU NONE DETECTED 07/10/2008 1721   LABBARB  Value: NONE DETECTED        DRUG SCREEN FOR MEDICAL  PURPOSES ONLY.  IF CONFIRMATION IS NEEDED FOR ANY PURPOSE, NOTIFY LAB WITHIN 5 DAYS. 07/10/2008 1721    Alcohol Level: No results found for this basename: ETH,  in the last 168 hours  Ct Abdomen Pelvis Wo Contrast 02/05/2014   1. Haziness about the gallbladder could reflect gallbladder wall thickening, or could be artifactual in nature. Evaluation is mildly suboptimal due to motion artifact. Would correlate for associated symptoms. 2. No evidence of hydronephrosis; kidneys grossly unremarkable in appearance. 3. Diffuse calcification along the abdominal aorta and its branches, including along the superior mesenteric artery. 4. Diffuse coronary artery calcifications seen. 5. Trace right-sided pleural fluid noted. 6. Degenerative change noted at the left hip.       CT of the brain  02/04/2014    No evidence of acute intracranial abnormality.  Age related atrophy with small vessel ischemic changes and intracranial atherosclerosis.     MRI of the brain  02/07/2014   Truncated and motion degraded exam demonstrating multiple foci of abnormal signal primarily in the bilateral cerebral white matter.  Top differential considerations are acute watershed ischemia (such as due to recent hypotensive event) and sequelae of embolic event (although no posterior fossa involvement identified).    MRA of the brain    2D Echocardiogram  12/28/2013 EF 40-45% with no source of embolus.  Carotid Doppler  12/28/2013 No evidence of hemodynamically significant internal carotid artery stenosis. Vertebral artery flow is antegrade.   CXR  02/04/2014    Moderate enlargement of the cardiac silhouette.  Lungs clear.     EKG  atrial fibrillation, rate 65. For complete results please see formal report.   Therapy Recommendations SNF  Physical Exam   Elderly lady not in distress.Awake alert. Afebrile. Head is nontraumatic. Neck is supple without bruit. Hearing is normal. Cardiac exam no murmur or gallop. Lungs are clear to  auscultation. Distal pulses are well felt. Neurological Exam : Awake alert oriented x2. Mild dysarthria but can easily be understood. No aphasia. Follows commands well. Extraocular movements full range without nystagmus. Mild right lower facial asymmetry. Tongue midline. Motor system exam no upper or lower extremity drift with mild weakness of right grip, intrinsic hand muscles, right hip flexor and ankle dorsiflexors. Coordination slow on the right compared to the left. Sensation intact. Plantars both downgoing. Gait was not tested.  ASSESSMENT Ms. [name] is a 80 y.o. female admitted for weakness, increased falls and melena who also hads expressive aphasia, facial weakness and lethargy. Radiology read MRI as bilateral watershed infarcts; infarcts are bilateral, but thought  to be scattered embolic infarct secondary to atrial fibrillation as there is no documented hypotension or carotid stenosis.  On aspirin 81 mg orally every day prior to admission. Now on no antithrombotics for secondary stroke prevention. Patient with resultant expressive aphasia. Stroke work up underway.   atrial fibrillation, new finding  She was on coumadin during her 05/30/2010 admission when she had a post cath stroke that was thought to be embolic. It is documented that Dr. Claiborne Billings felt she had risk of thrombus given low EF. She was not seen by neurology. She was on coumadin up until 1.5 yrs ago when it was stopped due to GI hemorrhage.    Acute blood loss anemia  Previous endoscopies for similar bleeding/anemia demonstrated erosive gastritis and normal colonoscopy.   Stool guiac positive  hgb stable  Endoscopy - Erosive gastritis (inflammation) was found in the gastric antrum; RECOMMENDATIONS: Stay on PPI; ok to use 81 mg ASA daily in 2 weeks, avoid NSAID's   hypertension Hyperlipidemia, LDL 58, on crestor 20 mg daily PTA, now on lipitor 40 mg daily, at goal LDL < 70 for diabetics Diabetes, HgbA1c 11.5, goal <  7.0 CAD - NSTEMI this admission, hx MI Migraines obstructive sleep apnea  Hx stroke - summer 2011 CHF - ICM w/ EF 40-45% Acute renal failure  Elevated LFTs, likely shock liver from acute event, improving. Plan recheck in 1 week  Hospital day # 5  TREATMENT/PLAN  Recommend low dose aspirin in 2 weeks as per GI  Not a NOAC candidate currently give elevated GFR. Once GFR improves, change to NOAC - eliquis 2.5 mg bid - it is more effective in stroke prevention due to atrial fibrillation than aspirin, risk of bleeding similar to low dose aspirin.  Ongoing monitoring for GIB  Dispo:  SNF initially recommended given encephalopathy. Now that stroke is formal dx and pt showing improvement, has supportive family, stroke team recommends CIR assessment. Dr. Leonie Man discussed possibility to POA and Dr. Aileen Fass. Rehab admissions coordinator contacted.   Burnetta Sabin, MSN, RN, ANVP-BC, AGPCNP-BC [facility] Pager: [phone] 02/09/2014 9:21 AM  I have personally obtained a history, examined the patient, evaluated imaging results, and formulated the assessment and plan of care. I agree with the above.  Antony Contras, MD   To contact Stroke Continuity provider, please refer to [email or web address] After hours, contact General Neurology

## 2014-02-10 ENCOUNTER — Inpatient Hospital Stay (HOSPITAL_COMMUNITY)
Admission: RE | Admit: 2014-02-10 | Discharge: 2014-02-20 | DRG: 945 | Disposition: A | Discharge status: Home Health | Payer: Medicare Other | Source: Intra-hospital | Attending: Physical Medicine & Rehabilitation | Admitting: Physical Medicine & Rehabilitation

## 2014-02-10 DIAGNOSIS — R414 Neurologic neglect syndrome: Secondary | ICD-10-CM | POA: Diagnosis present

## 2014-02-10 DIAGNOSIS — I2510 Atherosclerotic heart disease of native coronary artery without angina pectoris: Secondary | ICD-10-CM | POA: Diagnosis present

## 2014-02-10 DIAGNOSIS — I6340 Cerebral infarction due to embolism of unspecified cerebral artery: Secondary | ICD-10-CM | POA: Diagnosis present

## 2014-02-10 DIAGNOSIS — Z87891 Personal history of nicotine dependence: Secondary | ICD-10-CM

## 2014-02-10 DIAGNOSIS — I6350 Cerebral infarction due to unspecified occlusion or stenosis of unspecified cerebral artery: Secondary | ICD-10-CM

## 2014-02-10 DIAGNOSIS — Z794 Long term (current) use of insulin: Secondary | ICD-10-CM

## 2014-02-10 DIAGNOSIS — I428 Other cardiomyopathies: Secondary | ICD-10-CM

## 2014-02-10 DIAGNOSIS — N179 Acute kidney failure, unspecified: Secondary | ICD-10-CM

## 2014-02-10 DIAGNOSIS — E1165 Type 2 diabetes mellitus with hyperglycemia: Secondary | ICD-10-CM

## 2014-02-10 DIAGNOSIS — I129 Hypertensive chronic kidney disease with stage 1 through stage 4 chronic kidney disease, or unspecified chronic kidney disease: Secondary | ICD-10-CM | POA: Diagnosis present

## 2014-02-10 DIAGNOSIS — F0390 Unspecified dementia without behavioral disturbance: Secondary | ICD-10-CM | POA: Diagnosis present

## 2014-02-10 DIAGNOSIS — D649 Anemia, unspecified: Secondary | ICD-10-CM

## 2014-02-10 DIAGNOSIS — B9620 Unspecified Escherichia coli [E. coli] as the cause of diseases classified elsewhere: Secondary | ICD-10-CM | POA: Diagnosis not present

## 2014-02-10 DIAGNOSIS — K3184 Gastroparesis: Secondary | ICD-10-CM | POA: Diagnosis present

## 2014-02-10 DIAGNOSIS — E1149 Type 2 diabetes mellitus with other diabetic neurological complication: Secondary | ICD-10-CM | POA: Diagnosis present

## 2014-02-10 DIAGNOSIS — K921 Melena: Secondary | ICD-10-CM | POA: Diagnosis present

## 2014-02-10 DIAGNOSIS — R413 Other amnesia: Secondary | ICD-10-CM | POA: Diagnosis present

## 2014-02-10 DIAGNOSIS — A498 Other bacterial infections of unspecified site: Secondary | ICD-10-CM | POA: Diagnosis present

## 2014-02-10 DIAGNOSIS — I10 Essential (primary) hypertension: Secondary | ICD-10-CM | POA: Diagnosis present

## 2014-02-10 DIAGNOSIS — R4701 Aphasia: Secondary | ICD-10-CM | POA: Diagnosis present

## 2014-02-10 DIAGNOSIS — N189 Chronic kidney disease, unspecified: Secondary | ICD-10-CM | POA: Diagnosis present

## 2014-02-10 DIAGNOSIS — G609 Hereditary and idiopathic neuropathy, unspecified: Secondary | ICD-10-CM | POA: Diagnosis present

## 2014-02-10 DIAGNOSIS — I509 Heart failure, unspecified: Secondary | ICD-10-CM | POA: Diagnosis present

## 2014-02-10 DIAGNOSIS — IMO0002 Reserved for concepts with insufficient information to code with codable children: Secondary | ICD-10-CM

## 2014-02-10 DIAGNOSIS — N390 Urinary tract infection, site not specified: Secondary | ICD-10-CM | POA: Diagnosis present

## 2014-02-10 DIAGNOSIS — I214 Non-ST elevation (NSTEMI) myocardial infarction: Secondary | ICD-10-CM

## 2014-02-10 DIAGNOSIS — I639 Cerebral infarction, unspecified: Secondary | ICD-10-CM | POA: Diagnosis present

## 2014-02-10 DIAGNOSIS — Z5189 Encounter for other specified aftercare: Principal | ICD-10-CM

## 2014-02-10 DIAGNOSIS — I5032 Chronic diastolic (congestive) heart failure: Secondary | ICD-10-CM | POA: Diagnosis present

## 2014-02-10 LAB — COMPREHENSIVE METABOLIC PANEL
ALT: 66 U/L — ABNORMAL HIGH (ref 0–35)
AST: 20 U/L (ref 0–37)
Albumin: 2.7 g/dL — ABNORMAL LOW (ref 3.5–5.2)
Alkaline Phosphatase: 85 U/L (ref 39–117)
BUN: 62 mg/dL — ABNORMAL HIGH (ref 6–23)
CO2: 23 mEq/L (ref 19–32)
Calcium: 8.9 mg/dL (ref 8.4–10.5)
Chloride: 102 mEq/L (ref 96–112)
Creatinine, Ser: 4.42 mg/dL — ABNORMAL HIGH (ref 0.50–1.10)
GFR calc Af Amer: 10 mL/min — ABNORMAL LOW (ref >90)
GFR calc non Af Amer: 9 mL/min — ABNORMAL LOW (ref >90)
Glucose, Bld: 176 mg/dL — ABNORMAL HIGH (ref 70–99)
Potassium: 4 mEq/L (ref 3.7–5.3)
Sodium: 140 mEq/L (ref 137–147)
Total Bilirubin: 0.7 mg/dL (ref 0.3–1.2)
Total Protein: 6.7 g/dL (ref 6.0–8.3)

## 2014-02-10 LAB — GLUCOSE, CAPILLARY
Glucose-Capillary: 162 mg/dL — ABNORMAL HIGH (ref 70–99)
Glucose-Capillary: 180 mg/dL — ABNORMAL HIGH (ref 70–99)
Glucose-Capillary: 191 mg/dL — ABNORMAL HIGH (ref 70–99)
Glucose-Capillary: 219 mg/dL — ABNORMAL HIGH (ref 70–99)
Glucose-Capillary: 280 mg/dL — ABNORMAL HIGH (ref 70–99)
Glucose-Capillary: 320 mg/dL — ABNORMAL HIGH (ref 70–99)

## 2014-02-10 MED ORDER — AMLODIPINE BESYLATE 10 MG PO TABS
10.0000 mg | ORAL_TABLET | Freq: Every day | ORAL | Status: DC
  Administered 2014-02-11 – 2014-02-20 (×10): 10 mg via ORAL
  Filled 2014-02-10 (×11): qty 1

## 2014-02-10 MED ORDER — FLEET ENEMA 7-19 GM/118ML RE ENEM: 1.0000 | ENEMA | Freq: Once | RECTAL | Status: AC | PRN

## 2014-02-10 MED ORDER — INSULIN ASPART 100 UNIT/ML ~~LOC~~ SOLN
0.0000 [IU] | SUBCUTANEOUS | Status: DC
  Administered 2014-02-10: 5 [IU] via SUBCUTANEOUS
  Administered 2014-02-10: 7 [IU] via SUBCUTANEOUS
  Administered 2014-02-11 (×2): 5 [IU] via SUBCUTANEOUS
  Administered 2014-02-11 (×2): 9 [IU] via SUBCUTANEOUS
  Administered 2014-02-11: 1 [IU] via SUBCUTANEOUS
  Administered 2014-02-12: 2 [IU] via SUBCUTANEOUS
  Administered 2014-02-12: 3 [IU] via SUBCUTANEOUS
  Administered 2014-02-12: 2 [IU] via SUBCUTANEOUS
  Administered 2014-02-12: 3 [IU] via SUBCUTANEOUS
  Administered 2014-02-12: 7 [IU] via SUBCUTANEOUS
  Administered 2014-02-12: 2 [IU] via SUBCUTANEOUS
  Administered 2014-02-13: 1 [IU] via SUBCUTANEOUS
  Administered 2014-02-13: 2 [IU] via SUBCUTANEOUS
  Administered 2014-02-13: 5 [IU] via SUBCUTANEOUS
  Administered 2014-02-13 (×2): 1 [IU] via SUBCUTANEOUS
  Administered 2014-02-13: 5 [IU] via SUBCUTANEOUS
  Administered 2014-02-14: 2 [IU] via SUBCUTANEOUS
  Administered 2014-02-14: 3 [IU] via SUBCUTANEOUS
  Administered 2014-02-14: 1 [IU] via SUBCUTANEOUS
  Administered 2014-02-14 – 2014-02-15 (×2): 5 [IU] via SUBCUTANEOUS
  Administered 2014-02-15 (×2): 2 [IU] via SUBCUTANEOUS
  Administered 2014-02-15: 5 [IU] via SUBCUTANEOUS
  Administered 2014-02-16: 2 [IU] via SUBCUTANEOUS

## 2014-02-10 MED ORDER — PANTOPRAZOLE SODIUM 40 MG PO PACK
40.0000 mg | PACK | Freq: Two times a day (BID) | ORAL | Status: DC
  Administered 2014-02-10 – 2014-02-13 (×7): 40 mg
  Filled 2014-02-10 (×11): qty 20

## 2014-02-10 MED ORDER — METOPROLOL SUCCINATE ER 25 MG PO TB24
25.0000 mg | ORAL_TABLET | Freq: Every day | ORAL | Status: DC
  Administered 2014-02-11 – 2014-02-20 (×10): 25 mg via ORAL
  Filled 2014-02-10 (×11): qty 1

## 2014-02-10 MED ORDER — ADULT MULTIVITAMIN W/MINERALS CH
1.0000 | ORAL_TABLET | Freq: Every day | ORAL | Status: DC
  Administered 2014-02-11 – 2014-02-20 (×10): 1 via ORAL
  Filled 2014-02-10 (×11): qty 1

## 2014-02-10 MED ORDER — ISOSORBIDE MONONITRATE ER 60 MG PO TB24
60.0000 mg | ORAL_TABLET | Freq: Every day | ORAL | Status: DC
  Administered 2014-02-11 – 2014-02-20 (×10): 60 mg via ORAL
  Filled 2014-02-10 (×11): qty 1

## 2014-02-10 MED ORDER — BISACODYL 10 MG RE SUPP
10.0000 mg | Freq: Every day | RECTAL | Status: DC | PRN
  Administered 2014-02-17: 10 mg via RECTAL
  Filled 2014-02-10: qty 1

## 2014-02-10 MED ORDER — ONDANSETRON HCL 4 MG/2ML IJ SOLN: 4.0000 mg | Freq: Four times a day (QID) | INTRAMUSCULAR | Status: DC | PRN

## 2014-02-10 MED ORDER — INSULIN GLARGINE 100 UNIT/ML ~~LOC~~ SOLN
5.0000 [IU] | Freq: Every day | SUBCUTANEOUS | Status: DC
  Administered 2014-02-10 – 2014-02-11 (×2): 5 [IU] via SUBCUTANEOUS
  Filled 2014-02-10 (×3): qty 0.05

## 2014-02-10 MED ORDER — ATORVASTATIN CALCIUM 40 MG PO TABS
40.0000 mg | ORAL_TABLET | Freq: Every day | ORAL | Status: DC
  Administered 2014-02-10 – 2014-02-19 (×10): 40 mg via ORAL
  Filled 2014-02-10 (×11): qty 1

## 2014-02-10 MED ORDER — NITROGLYCERIN 0.4 MG SL SUBL: 0.4000 mg | SUBLINGUAL_TABLET | SUBLINGUAL | Status: DC | PRN

## 2014-02-10 MED ORDER — HYDROXYZINE HCL 10 MG PO TABS
10.0000 mg | ORAL_TABLET | Freq: Three times a day (TID) | ORAL | Status: DC | PRN
  Filled 2014-02-10 (×2): qty 1

## 2014-02-10 MED ORDER — POLYETHYLENE GLYCOL 3350 17 G PO PACK
17.0000 g | PACK | Freq: Every day | ORAL | Status: DC | PRN
  Filled 2014-02-10: qty 1

## 2014-02-10 MED ORDER — TRAZODONE HCL 50 MG PO TABS
25.0000 mg | ORAL_TABLET | Freq: Every evening | ORAL | Status: DC | PRN
  Administered 2014-02-10 – 2014-02-19 (×4): 50 mg via ORAL
  Filled 2014-02-10 (×4): qty 1

## 2014-02-10 MED ORDER — ALUMINUM HYDROXIDE GEL 320 MG/5ML PO SUSP
30.0000 mL | Freq: Four times a day (QID) | ORAL | Status: DC | PRN
  Administered 2014-02-11: 30 mL via ORAL
  Filled 2014-02-10: qty 30

## 2014-02-10 MED ORDER — GUAIFENESIN-DM 100-10 MG/5ML PO SYRP: 5.0000 mL | ORAL_SOLUTION | Freq: Four times a day (QID) | ORAL | Status: DC | PRN

## 2014-02-10 MED ORDER — GLUCERNA SHAKE PO LIQD: 237.0000 mL | Freq: Two times a day (BID) | ORAL | Status: DC | PRN

## 2014-02-10 MED ORDER — ACETAMINOPHEN 325 MG PO TABS
325.0000 mg | ORAL_TABLET | ORAL | Status: DC | PRN
  Administered 2014-02-15 – 2014-02-17 (×2): 650 mg via ORAL
  Filled 2014-02-10 (×2): qty 2

## 2014-02-10 MED ORDER — ONDANSETRON HCL 4 MG PO TABS: 4.0000 mg | ORAL_TABLET | Freq: Four times a day (QID) | ORAL | Status: DC | PRN

## 2014-02-10 NOTE — Evaluation (Signed)
Occupational Therapy Assessment and Plan  Patient Details  Name: [name] MRN: [id number] Date of Birth: 11-17-1932  OT Diagnosis: abnormal posture, blindness and low vision and cognitive deficits Rehab Potential: Rehab Potential: Good ELOS: 2 weeks   Today's Date: 02/10/2014 Time: 1884-1660 Time Calculation (min): 60 min  Problem List:  Patient Active Problem List   Diagnosis Date Noted  . CVA (cerebral infarction) 02/10/2014  . Acute embolic stroke 63/10/6008  . Erosive gastritis with hemorrhage 02/08/2014  . Acute bilat watershed infarction vs. embolic strokes 93/23/5573  . Chronic diastolic CHF EF 22% 02/54/2706  . Non-ST elevation MI (NSTEMI) 02/04/2014  . Renal failure, acute 02/04/2014  . NSTEMI (non-ST elevated myocardial infarction) 02/04/2014  . Nonischemic cardiomyopathy 12/12/2013  . ARF (acute renal failure) 12/05/2013  . Memory difficulties 12/04/2013  . Pulmonary edema 12/03/2013  . Acute diastolic CHF (congestive heart failure) 12/03/2013  . URI (upper respiratory infection) 03/02/2012  . Irregular heart rhythm 02/26/2012  . Ventricular bigeminy 02/26/2012  . GI bleed 08/24/2011  . CAD (coronary artery disease)   . CEREBRAL EMBOLISM, WITH INFARCTION 07/02/2010  . OSTEOPOROSIS 03/21/2009  . DEGENERATIVE JOINT DISEASE 07/24/2008  . INSOMNIA-SLEEP DISORDER-UNSPEC 07/24/2008  . ANEMIA 01/06/2008  . OBSTRUCTIVE SLEEP APNEA 01/06/2008  . GASTROPARESIS, DIABETIC 08/03/2006  . HYPERLIPIDEMIA 08/03/2006  . PERIPHERAL NEUROPATHY 08/03/2006  . HYPERTENSION 08/03/2006  . Chronic diastolic congestive heart failure 08/03/2006  . REACTIVE AIRWAY DISEASE 08/03/2006  . GERD 08/03/2006  . DM (diabetes mellitus), type 2, uncontrolled 11/04/1983    Past Medical History:  Past Medical History  Diagnosis Date  . Asthma   . CAD (coronary artery disease)     Pt reports MI in 2006 (no documentation).  Cardiolite in 05/2002 and 07/2006 did not reveal any reversible  ischemia.  Pt follows with Dr. Rex Hurley at [facility].  . CHF (congestive heart failure)     EF 25-30% with dilated LV, mild LVH, severe hypokinesis, and mod-severe reduction in RV function  . Osteoporosis   . HYPERTENSION 08/03/2006  . GASTROPARESIS, DIABETIC 08/03/2006  . HYPERLIPIDEMIA 08/03/2006  . OBSTRUCTIVE SLEEP APNEA 01/06/2008  . PERIPHERAL NEUROPATHY 08/03/2006  . GERD 08/03/2006  . LOW BACK PAIN, CHRONIC 08/03/2006  . OSTEOPOROSIS 03/21/2009  . CEREBRAL EMBOLISM, WITH INFARCTION 07/02/2010  . Angina   . Myocardial infarction "2 or 3"  . Pneumonia 02/26/12    "a few times; probably even today"  . Shortness of breath     "all the time"  . DIABETES MELLITUS, TYPE II 11/04/1983  . Blood transfusion 08/2011  . Lower GI bleeding 08/2011 and 01/2014  . Chronic daily headache   . Migraines   . Stroke summer 2011    "made my left hip worse"  . Uterine cancer   . Nonischemic cardiomyopathy 05/2010    Left heart catheterization:2011. Nonobstructive coronary artery disease.  . Pulmonary hypertension   . Hypertension    Past Surgical History:  Past Surgical History  Procedure Laterality Date  . Esophagogastroduodenoscopy  08/26/2011    Procedure: ESOPHAGOGASTRODUODENOSCOPY (EGD);  Surgeon: Cheryl Mayer, MD;  Location: [facility] ENDOSCOPY;  Service: Endoscopy;  Laterality: N/A;  . Colonoscopy  08/28/2011    Procedure: COLONOSCOPY;  Surgeon: Cheryl Mayer, MD;  Location: [place];  Service: Endoscopy;  Laterality: N/A;  . Vaginal hysterectomy    . Tubal ligation    . Cataract extraction w/ intraocular lens  implant, bilateral    . Toe surgery      "right big toe;  operated on it to straighten it out; it was under"  . Polysomnogram  10/17/2005    AHI-7.28/hr. AHI REM-20.8/hr. Average oxygen saturation range during REM and NREM was 97%. Lowest oxygen saturation during REM sleep was 90%.  . Carotid duplex  05/28/2010    No significant extracranial carotid artery stenosis demonstrated. Vertebrals  are patent w/ antegrade flow.  . Cardiac catheterization  05/24/2010    No intervention - recommed medical therapy.  . Cardiovascular stress test  08/07/2006    Moderate-severe defect seen in Basal inferior, Mid inferoseptal, Mid inferior, Mid inferolateral, and Apical inferior regions - consistent w/ infarct/scar. No scintigraphic evidence of inducible myocardial ischemia.  . Transthoracic echocardiogram  08/29/2011    EF 55-60%, moderate LVH,   . Esophagogastroduodenoscopy N/A 02/08/2014    Procedure: ESOPHAGOGASTRODUODENOSCOPY (EGD);  Surgeon: Cheryl Mayer, MD;  Location: [facility] ENDOSCOPY;  Service: Endoscopy;  Laterality: N/A;    Assessment & Plan Clinical Impression: [name] is a 80 y.o. female with h/o CAD, DM type 2 with gastroparesis, CHF exacerbation 11/2103, GIB, early dementia who has had recent increase in falls with melena. She was admitted on 04/25 /15 with expressive aphasia, lethargy as well as twisted lip. She was found to be in acute renal failure and anemic with hgb-5.6. She was treated with IVF and transfused with 2 unit PRBC. Patient with elevated cardiac enzyme that was felt to be multifactorial due to demand ischemia. 2 D echo with EF 40-45%. [name] consulted for input on acute on chronic renal failure and recommended d/c ace as well as hydration as patient poor HD candidate. Patient continued to be confused with fluctuating mentation and MRI brain done revealing multiple bilateral watershed infarcts. Carotid dopplers without ICA stenosis. Neurology recommended ASA if GIB resolved. [name] consulted for input and EGD done on 02/08/14 Revealing erosive gastritis in gastric antrum. She's cleared to use low dose ASA in two weeks if needed. Neurology feels that patient with embolic infarcts due to A Fib and Eliquis 2.5 mg bid recommended if CrCL >30. Patient with poor renal recovery and nephrology recommended palliative care to discuss [name]. EEG with evidence of mild  encephalopathy. Today with improvement in mentation. Patient with subsequent right neglect, moderate dysarthria, problems with initiation, attention as well as aphasia  ROS  Limited due to language and cognitive status   Patient transferred to CIR on 02/10/2014 .    Patient currently requires mod with basic self-care skills secondary to impaired timing and sequencing and decreased motor planning, decreased visual perceptual skills, field cut and hemianopsia, decreased attention to right, right side neglect and decreased motor planning and decreased initiation, decreased attention, decreased awareness, decreased problem solving, decreased safety awareness, decreased memory and delayed processing.  Prior to hospitalization, patient could complete BADL with independent .  Patient will benefit from skilled intervention to increase independence with basic self-care skills prior to discharge home with care partner.  Anticipate patient will require 24 hour supervision and follow up home health.  OT - End of Session Activity Tolerance: Tolerates 10 - 20 min activity with multiple rests Endurance Deficit: Yes Endurance Deficit Description: fatiqued and wanting to go to bed after 45 minutes of OT session OT Assessment Rehab Potential: Good Barriers to Discharge: Other (comment) (small bathroom) OT Patient demonstrates impairments in the following area(s): Balance;Cognition;Edema;Motor;Perception;Safety;Vision OT Basic ADL's Functional Problem(s): Grooming;Eating;Bathing;Dressing;Toileting OT Advanced ADL's Functional Problem(s): Simple Meal Preparation OT Transfers Functional Problem(s): Toilet;Tub/Shower OT Additional Impairment(s): None OT Plan OT Intensity:  Minimum of 1-2 x/day, 45 to 90 minutes OT Frequency: 5 out of 7 days OT Duration/Estimated Length of Stay: 2 weeks OT Treatment/Interventions: Balance/vestibular training;Discharge planning;DME/adaptive equipment instruction;Functional mobility  training;Patient/family education;Self Care/advanced ADL retraining;Therapeutic Activities;Therapeutic Exercise;UE/LE Strength taining/ROM;Visual/perceptual remediation/compensation OT Self Feeding Anticipated Outcome(s): Independent OT Basic Self-Care Anticipated Outcome(s): supervision OT Toileting Anticipated Outcome(s): supervision OT Bathroom Transfers Anticipated Outcome(s): supervision OT Recommendation Patient destination: Home Equipment Recommended: To be determined   Skilled Therapeutic Intervention Addressed bed mobility, transfers, cognition, Right field cut vs inattention.  Son present during session.  Utilized minimal cues for functional tasks.  Motor planning with bed mobility and inattention to right.  Needed physical cues to scoot to right.  Ate food on one side of plate.    OT Evaluation Precautions/Restrictions  Precautions Precautions: Fall Precaution Comments: right field cut  Restrictions Weight Bearing Restrictions: No    Vital Signs Therapy Vitals  Oxygen Therapy SpO2: 94 % O2 Device: None (Room air) Pain Pain Assessment Pain Assessment: No/denies pain PAINAD (Pain Assessment in Advanced Dementia) Breathing: normal Negative Vocalization: none Facial Expression: smiling or inexpressive Body Language: relaxed Consolability: no need to console PAINAD Score: 0 Home Living/Prior Functioning Home Living Family/patient expects to be discharged to:: Skilled nursing facility Living Arrangements: Children Available Help at Discharge: Family;Available 24 hours/day Type of Home: Apartment Home Access: Stairs to enter Entrance Stairs-Number of Steps: 1 Entrance Stairs-Rails: None Home Layout: One level Additional Comments: Pt takes on showers, cooks and self sufficient. Puts her own pills in pill box weeksly, checks her CBGS twice per day, gives own insulin via Pen BID. Family does laundry and runs her errands, pt has never driven  Lives With: Son IADL  History Homemaking Responsibilities: Yes Meal Prep Responsibility: Therapist, occupational Responsibility: No Cleaning Responsibility: No Bill Paying/Finance Responsibility: No Shopping Responsibility: No Current License: No Mode of Transportation: Car Occupation: Retired Prior Function Level of Independence: Independent with basic ADLs;Independent with homemaking with ambulation;Independent with transfers;Independent with gait (Used RW around home)  Able to Take Stairs?: No Driving: No Vocation: Retired ADL   Vision/Perception  Vision- Risk analyst: Within Functional Limits Additional Comments: Right field cut Perception Perception: Impaired Inattention/Neglect: Does not attend to right visual field;Does not attend to right side of body Spatial Orientation:  (decreased spatial concepts) Praxis Praxis: Intact  Cognition Overall Cognitive Status: Impaired/Different from baseline Arousal/Alertness: Awake/alert Orientation Level: Oriented to person;Disoriented to place;Disoriented to time;Disoriented to situation;Other (comment) Attention: Focused;Sustained Focused Attention: Impaired Focused Attention Impairment: Functional basic;Verbal basic Sustained Attention: Impaired Sustained Attention Impairment: Verbal basic;Functional basic Memory: Impaired Memory Impairment: Decreased recall of new information;Decreased short term memory Decreased Short Term Memory: Functional basic;Verbal basic Awareness: Impaired Awareness Impairment: Intellectual impairment;Emergent impairment Problem Solving: Impaired Problem Solving Impairment: Verbal basic;Functional basic Executive Function: Organizing;Sequencing;Decision Making;Self Monitoring;Self Correcting Sequencing: Impaired Sequencing Impairment: Verbal basic;Functional basic Organizing: Impaired Organizing Impairment: Verbal complex;Functional complex Decision Making: Impaired Decision Making Impairment: Verbal  basic;Functional basic Self Monitoring: Impaired Self Monitoring Impairment: Verbal basic;Functional basic Self Correcting: Impaired Self Correcting Impairment: Verbal complex;Functional complex Behaviors: Poor frustration tolerance Safety/Judgment: Impaired Sensation Sensation Light Touch: Appears Intact Proprioception: Appears Intact Coordination Gross Motor Movements are Fluid and Coordinated: Yes Hurley Motor Movements are Fluid and Coordinated: Yes Motor  Motor Motor: Abnormal postural alignment and control Motor - Skilled Clinical Observations: decreased following UE ROM directions Mobility  Bed Mobility Bed Mobility: Supine to Sit;Sit to Supine Supine to Sit: 4: Min assist;With rails;HOB flat Supine to Sit Details: Verbal cues for precautions/safety;Verbal  cues for technique Sit to Supine: 3: Mod assist;HOB flat Sit to Supine - Details: Manual facilitation for placement Transfers Sit to Stand: 4: Min assist;With upper extremity assist;From bed;From toilet Sit to Stand Details: Tactile cues for sequencing;Tactile cues for posture;Verbal cues for technique;Verbal cues for precautions/safety  Trunk/Postural Assessment  Cervical Assessment Cervical Assessment: Within Functional Limits Thoracic Assessment Thoracic Assessment: Within Functional Limits Lumbar Assessment Lumbar Assessment: Within Functional Limits Postural Control Postural Control: Deficits on evaluation  Balance Balance Balance Assessed: Yes Dynamic Sitting Balance Dynamic Sitting - Balance Activities: Reaching across midline;Trunk control activities Dynamic Standing Balance Dynamic Standing - Balance Activities: Reaching for objects;Forward lean/weight shifting;Lateral lean/weight shifting Extremity/Trunk Assessment RUE Assessment RUE Assessment: Exceptions to [facility] RUE Strength RUE Overall Strength: Deficits (4/5) LUE Assessment LUE Assessment: Within Functional Limits  FIM:  FIM - Eating Eating  Activity: 5: Supervision/cues;5: Set-up assist for open containers;5: Set-up assist for cut food FIM - Grooming Grooming: 0: Activity did not occur FIM - Bathing Bathing: 0: Activity did not occur FIM - Upper Body Dressing/Undressing Upper body dressing/undressing: 0: Activity did not occur FIM - Lower Body Dressing/Undressing Lower body dressing/undressing: 0: Activity did not occur FIM - Toileting Toileting steps completed by patient: Performs perineal hygiene Toileting Assistive Devices: Grab bar or rail for support Toileting: 2: Max-Patient completed 1 of 3 steps FIM - Control and instrumentation engineer Devices: Bed rails Bed/Chair Transfer: 4: Supine > Sit: Min A (steadying Pt. > 75%/lift 1 leg);3: Sit > Supine: Mod A (lifting assist/Pt. 50-74%/lift 2 legs);4: Bed > Chair or W/C: Min A (steadying Pt. > 75%);3: Chair or W/C > Bed: Mod A (lift or lower assist) FIM - Radio producer Devices: Grab bars Toilet Transfers: 4-To toilet/BSC: Min A (steadying Pt. > 75%);4-From toilet/BSC: Min A (steadying Pt. > 75%) FIM - Tub/Shower Transfers Tub/shower Transfers: 0-Activity did not occur or was simulated   Refer to Care Plan for Long Term Goals  Recommendations for other services: None  Discharge Criteria: Patient will be discharged from OT if patient refuses treatment 3 consecutive times without medical reason, if treatment goals not met, if there is a change in medical status, if patient makes no progress towards goals or if patient is discharged from hospital.  The above assessment, treatment plan, treatment alternatives and goals were discussed and mutually agreed upon: by patient and by family  Lisa Roca 02/10/2014, 7:04 PM

## 2014-02-10 NOTE — H&P (Signed)
Physical Medicine and Rehabilitation Admission H&P  Chief Complaint   Patient presents with   .  Aphasia and right neglect   HPI: [name] is a 80 y.o. female with h/o CAD, DM type 2 with gastroparesis, CHF exacerbation 11/2103, GIB, early dementia who has had recent increase in falls with melena. She was admitted on 04/25 /15 with expressive aphasia, lethargy as well as twisted lip. She was found to be in acute renal failure and anemic with hgb-5.6. She was treated with IVF and transfused with 2 unit PRBC. Patient with elevated cardiac enzyme that was felt to be multifactorial due to demand ischemia. 2 D echo with EF 40-45%. Dr. Jonnie Finner consulted for input on acute on chronic renal failure and recommended d/c ace as well as hydration as patient poor HD candidate. Patient continued to be confused with fluctuating mentation and MRI brain done revealing multiple bilateral watershed infarcts. Carotid dopplers without ICA stenosis. Neurology recommended ASA if GIB resolved. Dr. Carlean Purl consulted for input and EGD done on 02/08/14 Revealing erosive gastritis in gastric antrum. She's cleared to use low dose ASA in two weeks if needed. Neurology feels that patient with embolic infarcts due to A Fib and Eliquis 2.5 mg bid recommended if CrCL >30. Patient with poor renal recovery and nephrology recommended palliative care to discuss [place]. EEG with evidence of mild encephalopathy. Today with improvement in mentation. Patient with subsequent right neglect, moderate dysarthria, problems with initiation, attention as well as aphasia    ROS  Limited due to language and cognitive status. Pt apparently had a restless night last night per RN. Son at bedside reports nothing out of the ordinary today.    Past Medical History   Diagnosis  Date   .  Asthma    .  CAD (coronary artery disease)      Pt reports MI in 2006 (no documentation). Cardiolite in 05/2002 and 07/2006 did not reveal any reversible ischemia. Pt  follows with Dr. Rex Kras at [facility].   .  CHF (congestive heart failure)      EF 25-30% with dilated LV, mild LVH, severe hypokinesis, and mod-severe reduction in RV function   .  Osteoporosis    .  HYPERTENSION  08/03/2006   .  GASTROPARESIS, DIABETIC  08/03/2006   .  HYPERLIPIDEMIA  08/03/2006   .  OBSTRUCTIVE SLEEP APNEA  01/06/2008   .  PERIPHERAL NEUROPATHY  08/03/2006   .  GERD  08/03/2006   .  LOW BACK PAIN, CHRONIC  08/03/2006   .  OSTEOPOROSIS  03/21/2009   .  CEREBRAL EMBOLISM, WITH INFARCTION  07/02/2010   .  Angina    .  Myocardial infarction  "2 or 3"   .  Pneumonia  02/26/12     "a few times; probably even today"   .  Shortness of breath      "all the time"   .  DIABETES MELLITUS, TYPE II  11/04/1983   .  Blood transfusion  08/2011   .  Lower GI bleeding  08/2011 and 01/2014   .  Chronic daily headache    .  Migraines    .  Stroke  summer 2011     "made my left hip worse"   .  Uterine cancer    .  Nonischemic cardiomyopathy  05/2010     Left heart catheterization:2011. Nonobstructive coronary artery disease.   .  Pulmonary hypertension    .  Hypertension  Past Surgical History   Procedure  Laterality  Date   .  Esophagogastroduodenoscopy   08/26/2011     Procedure: ESOPHAGOGASTRODUODENOSCOPY (EGD); Surgeon: [name], MD; Location: [facility] ENDOSCOPY; Service: Endoscopy; Laterality: N/A;   .  Colonoscopy   08/28/2011     Procedure: COLONOSCOPY; Surgeon: [name], MD; Location: [place]; Service: Endoscopy; Laterality: N/A;   .  Vaginal hysterectomy     .  Tubal ligation     .  Cataract extraction w/ intraocular lens implant, bilateral     .  Toe surgery       "right big toe; operated on it to straighten it out; it was under"   .  Polysomnogram   10/17/2005     AHI-7.28/hr. AHI REM-20.8/hr. Average oxygen saturation range during REM and NREM was 97%. Lowest oxygen saturation during REM sleep was 90%.   .  Carotid duplex   05/28/2010     No significant  extracranial carotid artery stenosis demonstrated. Vertebrals are patent w/ antegrade flow.   .  Cardiac catheterization   05/24/2010     No intervention - recommed medical therapy.   .  Cardiovascular stress test   08/07/2006     Moderate-severe defect seen in Basal inferior, Mid inferoseptal, Mid inferior, Mid inferolateral, and Apical inferior regions - consistent w/ infarct/scar. No scintigraphic evidence of inducible myocardial ischemia.   .  Transthoracic echocardiogram   08/29/2011     EF 55-60%, moderate LVH,   .  Esophagogastroduodenoscopy  N/A  02/08/2014     Procedure: ESOPHAGOGASTRODUODENOSCOPY (EGD); Surgeon: [name], MD; Location: [facility] ENDOSCOPY; Service: Endoscopy; Laterality: N/A;    Family History   Problem  Relation  Age of Onset   .  Diabetes insipidus  Mother    .  Hypertension  Mother    .  Hypertension  Father    .  Hypertension  Sister    .  Hypertension  Child     Social History: Lives with family. Needed SBA when ambulating with walker. She reports that she has never smoked. She quit smokeless tobacco use about 7 years ago. Her smokeless tobacco use included Snuff. She reports that she does not drink alcohol or use illicit drugs.  Allergies   Allergen  Reactions   .  Baking Soda-Fluoride [Sodium Fluoride]  Nausea And Vomiting   .  Magnesium Hydroxide  Nausea And Vomiting    Medications Prior to Admission   Medication  Sig  Dispense  Refill   .  acetaminophen (TYLENOL) 500 MG tablet  Take 500 mg by mouth every 6 (six) hours as needed for moderate pain or headache.     .  albuterol (PROAIR HFA) 108 (90 BASE) MCG/ACT inhaler  Inhale 2 puffs into the lungs every 6 (six) hours as needed for wheezing.  8.5 g  11   .  amLODipine (NORVASC) 10 MG tablet  Take 1 tablet (10 mg total) by mouth daily.  30 tablet  2   .  aspirin 81 MG EC tablet  Take 81 mg by mouth daily.     .  insulin aspart (NOVOLOG FLEXPEN) 100 UNIT/ML FlexPen  Inject 7 Units into the skin daily with  breakfast.     .  insulin glargine (LANTUS) 100 UNIT/ML injection  Inject 4-34 Units into the skin 2 (two) times daily. Inject 4 units at dinner and 34 units at bedtime     .  isosorbide mononitrate (IMDUR) 60 MG 24 hr  tablet  Take 1 tablet (60 mg total) by mouth daily.  30 tablet  2   .  lisinopril (PRINIVIL,ZESTRIL) 5 MG tablet  Take 5 mg by mouth daily.     Marland Kitchen  loratadine (ALLERGY RELIEF) 10 MG tablet  Take 10 mg by mouth daily.     .  metoprolol succinate (TOPROL-XL) 25 MG 24 hr tablet  Take 1 tablet (25 mg total) by mouth daily.  30 tablet  0   .  Multiple Vitamins-Minerals (ALIVE WOMENS 50+ PO)  Take 1 tablet by mouth daily.     .  nitroGLYCERIN (NITROSTAT) 0.4 MG SL tablet  Place 1 tablet (0.4 mg total) under the tongue every 5 (five) minutes as needed. For chest pain.  30 tablet  3   .  polyethylene glycol (MIRALAX / GLYCOLAX) packet  Take 17 g by mouth daily.     .  potassium chloride (K-DUR) 10 MEQ tablet  Take 10 mEq by mouth daily.     .  rosuvastatin (CRESTOR) 20 MG tablet  Take 1 tablet (20 mg total) by mouth daily.  30 tablet  2   .  torsemide (DEMADEX) 10 MG tablet  Take 10 mg by mouth daily.     .  BD INSULIN SYRINGE ULTRAFINE 31G X 5/16" 0.3 ML MISC       Home:  Home Living  Family/patient expects to be discharged to:: Skilled nursing facility  Living Arrangements: Children (son, [name], lives with pt for 4 years)  Available Help at Discharge: Family;Available 24 hours/day  Type of Home: Apartment  Home Access: Stairs to enter  Entrance Stairs-Number of Steps: 1  Entrance Stairs-Rails: None  Home Layout: One level  Additional Comments: Pt takes on showers, cooks and self sufficient. Puts her own pills in pill box weeksly, checks her CBGS twice per day, gives own insulin via Pen BID. Family does laundry and runs her errands, pt has never driven  Lives With: Son  Functional History:  Prior Function  Level of Independence: Independent with assistive device(s)  Comments:  was modified independent wiht ADLs and with RW until recently, with falls leading up to this admission  Functional Status:  Mobility:  Bed Mobility  Overal bed mobility: +2 for physical assistance  Bed Mobility: Supine to Sit;Sit to Supine  Supine to sit: Max assist;+2 for physical assistance  Sit to supine: Total assist;+2 for physical assistance  General bed mobility comments: cues for sequencing & technique. (A) to lift shoulders/trunk to sitting upright, pivot hips around to EOB, & to lift LE's back into bed.  Transfers  Overall transfer level: Needs assistance  Transfers: Sit to/from Stand;Stand Pivot Transfers  Sit to Stand: Max assist;+2 safety/equipment  Stand pivot transfers: Max assist  General transfer comment: cues for sequencing & technique. 1st trial of SPT bed>3-in-1 +2 was used & 2nd trial performed with patient-therapist face-to-face positioning.    ADL: max assist due to weakness and cognition  Cognition:  Cognition  Overall Cognitive Status: Impaired/Different from baseline  Arousal/Alertness: Awake/alert  Orientation Level: Oriented to person  Attention: Focused;Sustained  Focused Attention: Impaired  Focused Attention Impairment: Functional basic;Verbal basic  Sustained Attention: Impaired  Sustained Attention Impairment: Verbal basic;Functional basic  Memory: (NT)  Awareness: Impaired  Awareness Impairment: Intellectual impairment;Emergent impairment  Problem Solving: Impaired  Problem Solving Impairment: Verbal basic;Functional basic  Behaviors: Perseveration  Safety/Judgment: Impaired  Cognition  Arousal/Alertness: Awake/alert  Behavior During Therapy: Flat affect  Overall Cognitive Status: Impaired/Different from baseline  Area of Impairment: Attention;Following commands;Problem solving  Current Attention Level: Focused  Following Commands: Follows one step commands inconsistently;Follows one step commands with increased time  Problem Solving: Slow  processing;Decreased initiation;Difficulty sequencing;Requires verbal cues;Requires tactile cues    Physical Exam:  Blood pressure 135/51, pulse 62, temperature 97.9 F (36.6 C), temperature source Oral, resp. rate 19, height _0  (1.575 m), weight 75.841 kg (167 lb 3.2 oz), last menstrual period 12/19/1968, SpO2 96.00%.   Constitutional: She appears well-developed and well-nourished. No distress HENT: oral mucosa pink, dentition fair at best Head: Normocephalic and atraumatic.  Eyes: Conjunctivae are normal. Pupils are equal, round, and reactive to light.  Neck: Normal range of motion. Neck supple.  Cardiovascular: Normal rate and regular rhythm. No murmurs or rubs  Respiratory: Effort normal and breath sounds normal. No respiratory distress. She has no wheezes. No rales GI: Soft. Bowel sounds are normal. She exhibits no distension. No pain Musculoskeletal: She exhibits no tenderness.  Neurological: She is alert and made eye contact with me. Oriented to self and place with cueing. Was able to ID family members but called her son "niece". She followed about 50% simple commands but there was often significant delay, and she often needed tactile cueing. She was perseverative with movements at times. Definitive receptive and expressive language deficits.   Strength grossly 3+ to 4/5 in both upper extremities but inconsistent due to attention/cognition/language. LE's 2/5 HF, 2 to 2+KE and 3/5 at ankles. Seemed to sense pain touch in all 4's. She did not seem to accurately detect light touch in the legs. (again inconsistent)   . Impulsive and needs occasional redirection. RUE inattention.  Skin: Skin is warm and dry.         Results for orders placed during the hospital encounter of 02/04/14 (from the past 48 hour(s))   GLUCOSE, CAPILLARY Status: Abnormal    Collection Time    02/08/14 1:06 PM   Result  Value  Ref Range    Glucose-Capillary  153 (*)  70 - 99 mg/dL    Comment 1  Documented in  Chart     Comment 2  Notify RN    GLUCOSE, CAPILLARY Status: Abnormal    Collection Time    02/08/14 4:38 PM   Result  Value  Ref Range    Glucose-Capillary  153 (*)  70 - 99 mg/dL    Comment 1  Documented in Chart     Comment 2  Notify RN    GLUCOSE, CAPILLARY Status: Abnormal    Collection Time    02/08/14 7:36 PM   Result  Value  Ref Range    Glucose-Capillary  165 (*)  70 - 99 mg/dL   GLUCOSE, CAPILLARY Status: Abnormal    Collection Time    02/09/14 1:29 AM   Result  Value  Ref Range    Glucose-Capillary  166 (*)  70 - 99 mg/dL    Comment 1  Notify RN    GLUCOSE, CAPILLARY Status: Abnormal    Collection Time    02/09/14 1:57 AM   Result  Value  Ref Range    Glucose-Capillary  176 (*)  70 - 99 mg/dL    Comment 1  Notify RN     Comment 2  Documented in Chart    GLUCOSE, CAPILLARY Status: Abnormal    Collection Time    02/09/14 3:46 AM   Result  Value  Ref Range    Glucose-Capillary  161 (*)  70 -  99 mg/dL   COMPREHENSIVE METABOLIC PANEL Status: Abnormal    Collection Time    02/09/14 6:50 AM   Result  Value  Ref Range    Sodium  143  137 - 147 mEq/L    Potassium  4.3  3.7 - 5.3 mEq/L    Chloride  106  96 - 112 mEq/L    CO2  20  19 - 32 mEq/L    Glucose, Bld  170 (*)  70 - 99 mg/dL    BUN  68 (*)  6 - 23 mg/dL    Creatinine, Ser  4.62 (*)  0.50 - 1.10 mg/dL    Calcium  8.9  8.4 - 10.5 mg/dL    Total Protein  6.5  6.0 - 8.3 g/dL    Albumin  2.5 (*)  3.5 - 5.2 g/dL    AST  33  0 - 37 U/L    ALT  81 (*)  0 - 35 U/L    Alkaline Phosphatase  84  39 - 117 U/L    Total Bilirubin  0.9  0.3 - 1.2 mg/dL    GFR calc non Af Amer  8 (*)  >90 mL/min    GFR calc Af Amer  10 (*)  >90 mL/min    Comment:  (NOTE)     The eGFR has been calculated using the CKD EPI equation.     This calculation has not been validated in all clinical situations.     eGFR's persistently <90 mL/min signify possible Chronic Kidney     Disease.   CBC Status: Abnormal    Collection Time    02/09/14  6:50 AM   Result  Value  Ref Range    WBC  8.3  4.0 - 10.5 K/uL    RBC  3.81 (*)  3.87 - 5.11 MIL/uL    Hemoglobin  8.7 (*)  12.0 - 15.0 g/dL    HCT  27.6 (*)  36.0 - 46.0 %    MCV  72.4 (*)  78.0 - 100.0 fL    MCH  22.8 (*)  26.0 - 34.0 pg    MCHC  31.5  30.0 - 36.0 g/dL    RDW  23.1 (*)  11.5 - 15.5 %    Platelets  261  150 - 400 K/uL   LIPID PANEL Status: None    Collection Time    02/09/14 6:50 AM   Result  Value  Ref Range    Cholesterol  130  0 - 200 mg/dL    Triglycerides  61  <150 mg/dL    HDL  60  >39 mg/dL    Total CHOL/HDL Ratio  2.2     VLDL  12  0 - 40 mg/dL    LDL Cholesterol  58  0 - 99 mg/dL    Comment:      Total Cholesterol/HDL:CHD Risk     Coronary Heart Disease Risk Table     Men Women     1/2 Average Risk 3.4 3.3     Average Risk 5.0 4.4     2 X Average Risk 9.6 7.1     3 X Average Risk 23.4 11.0         Use the calculated Patient Ratio     above and the CHD Risk Table     to determine the patient's CHD Risk.         ATP III CLASSIFICATION (LDL):     <100  mg/dL Optimal     100-129 mg/dL Near or Above     Optimal     130-159 mg/dL Borderline     160-189 mg/dL High     >190 mg/dL Very High   GLUCOSE, CAPILLARY Status: None    Collection Time    02/09/14 7:24 AM   Result  Value  Ref Range    Glucose-Capillary  73  70 - 99 mg/dL   GLUCOSE, CAPILLARY Status: Abnormal    Collection Time    02/09/14 10:51 AM   Result  Value  Ref Range    Glucose-Capillary  262 (*)  70 - 99 mg/dL    Comment 1  Notify RN    GLUCOSE, CAPILLARY Status: Abnormal    Collection Time    02/09/14 4:19 PM   Result  Value  Ref Range    Glucose-Capillary  276 (*)  70 - 99 mg/dL    Comment 1  Notify RN    GLUCOSE, CAPILLARY Status: Abnormal    Collection Time    02/09/14 8:47 PM   Result  Value  Ref Range    Glucose-Capillary  302 (*)  70 - 99 mg/dL    Comment 1  Notify RN     Comment 2  Documented in Chart    GLUCOSE, CAPILLARY Status: Abnormal    Collection Time      02/10/14 12:42 AM   Result  Value  Ref Range    Glucose-Capillary  180 (*)  70 - 99 mg/dL    Comment 1  Notify RN     Comment 2  Documented in Chart    GLUCOSE, CAPILLARY Status: Abnormal    Collection Time    02/10/14 4:41 AM   Result  Value  Ref Range    Glucose-Capillary  191 (*)  70 - 99 mg/dL    Comment 1  Notify RN     Comment 2  Documented in Chart    COMPREHENSIVE METABOLIC PANEL Status: Abnormal    Collection Time    02/10/14 6:30 AM   Result  Value  Ref Range    Sodium  140  137 - 147 mEq/L    Potassium  4.0  3.7 - 5.3 mEq/L    Chloride  102  96 - 112 mEq/L    CO2  23  19 - 32 mEq/L    Glucose, Bld  176 (*)  70 - 99 mg/dL    BUN  62 (*)  6 - 23 mg/dL    Creatinine, Ser  4.42 (*)  0.50 - 1.10 mg/dL    Calcium  8.9  8.4 - 10.5 mg/dL    Total Protein  6.7  6.0 - 8.3 g/dL    Albumin  2.7 (*)  3.5 - 5.2 g/dL    AST  20  0 - 37 U/L    ALT  66 (*)  0 - 35 U/L    Alkaline Phosphatase  85  39 - 117 U/L    Total Bilirubin  0.7  0.3 - 1.2 mg/dL    GFR calc non Af Amer  9 (*)  >90 mL/min    GFR calc Af Amer  10 (*)  >90 mL/min    Comment:  (NOTE)     The eGFR has been calculated using the CKD EPI equation.     This calculation has not been validated in all clinical situations.     eGFR's persistently <90 mL/min signify possible Chronic Kidney  Disease.   GLUCOSE, CAPILLARY Status: Abnormal    Collection Time    02/10/14 7:28 AM   Result  Value  Ref Range    Glucose-Capillary  162 (*)  70 - 99 mg/dL    Comment 1  Notify RN     No results found.  Medical Problem List and Plan:  1. Functional deficits secondary to Bilateral cerebral watershed infarcts related to acute anemia from GIB  2. DVT Prophylaxis/Anticoagulation: Mechanical: Sequential compression devices, below knee Bilateral lower extremities  3. Pain Management: Tylenol prn for pain.  4. Mood: No signs of distress. LCSW to follow with patient and family for evaluation and support.  5. Neuropsych: This  patient is not capable of making decisions on her own behalf.  6. GIB due to erosive gatritis: Increase PPI to bid as at home.  7. HTN: Will monitor with tid checks. Continue metoprolol and Imdur.  8. Acute on CRF: Baseline Cr 1.2-1.7. Slowly improving with BUN/Cr- 62/4.42  9. ABLA: Will continue to monitor H/H 3 x week. Is slowly improving. Will add iron supplement due to h/o iron deficiency.  10 DM type 2: Will resume Lantus insulin at conservative dose of 5 units/HS. Monitor BS with ac/hs checks and titrate medications as indicated. Use SSI for elevated BS.  11. Chronic diastolic CHF: Check daily weights and monitor for signs of overload. Low salt diet.  12 NSTEMI due to demand ischemia: treated medically with Imdur, metoprolol and Lipitor. No ASA due to GIB.  13. Mild dementia with sundowning (was functional PTA): Nursing on acute reports insomnia with restlessness/agitation at night but not documented in chart. Will monitor for trends more closely while on inpatient rehab. No repeat imaging warranted at this time as today's decreased attention and language are likely due to her restless evening.    Post Admission Physician Evaluation:  1. Functional deficits secondary to bi-cerebral watershed infarcts with weakness and cognitive-linguistic deficits. 2. Patient is admitted to receive collaborative, interdisciplinary care between the physiatrist, rehab nursing staff, and therapy team. 3. Patient's level of medical complexity and substantial therapy needs in context of that medical necessity cannot be provided at a lesser intensity of care such as a SNF. 4. Patient has experienced substantial functional loss from his/her baseline which was documented above under the "Functional History" and "Functional Status" headings. Judging by the patient's diagnosis, physical exam, and functional history, the patient has potential for functional progress which will result in measurable gains while on inpatient  rehab. These gains will be of substantial and practical use upon discharge in facilitating mobility and self-care at the household level. 5. Physiatrist will provide 24 hour management of medical needs as well as oversight of the therapy plan/treatment and provide guidance as appropriate regarding the interaction of the two. 6. 24 hour rehab nursing will assist with bladder management, bowel management, safety, skin/wound care, disease management, medication administration, pain management and patient education and help integrate therapy concepts, techniques,education, etc. 7. PT will assess and treat for/with: Lower extremity strength, range of motion, stamina, balance, functional mobility, safety, adaptive techniques and equipment, NMR, cognitive perceptual awareness, visual spatial awareness, family education and training, concentration and attention. Goals are: min assist. 8. OT will assess and treat for/with: ADL's, functional mobility, safety, upper extremity strength, adaptive techniques and equipment, NMR, cognitive perceptual and visual perceptual awareness, attention/concentration, family education. Goals are: min to mod assist as she will likely need sequencing for some tasks. 9. SLP will assess and treat for/with: communication,  language, cognition, swallowing, family ed and training. Goals are: min to mod assist. 10. Case Management and Social Worker will assess and treat for psychological issues and discharge planning. 11. Team conference will be held weekly to assess progress toward goals and to determine barriers to discharge. 12. Patient will receive at least 3 hours of therapy per day at least 5 days per week. 13. ELOS: 15-20 days  14. Prognosis: good   Meredith Staggers, MD, [facility]   02/10/2014

## 2014-02-10 NOTE — Progress Notes (Signed)
I have discussed with Sarajane Marek PTA and agree with change Dc recommendation to CIR.

## 2014-02-10 NOTE — Progress Notes (Signed)
Patient received from 3 E at approximately 1530. Patient alert and oriented to self . Patient and family oriented to room and call bell system . Family verbalized understanding of admission process . Continue with plan of care .       [name]

## 2014-02-10 NOTE — PMR Pre-admission (Signed)
PMR Admission Coordinator Pre-Admission Assessment  Patient: [name] is an 80 y.o., female MRN: [id number] DOB: 17-Jan-1933 Height: 5\' 2"  (157.5 cm) Weight: 75.841 kg (167 lb 3.2 oz) (bed scale)              Insurance Information HMO:     PPO:      PCP:      IPA:      80/20: yes     OTHER: no HMO PRIMARY: Medicare a and b      Policy#: [id number] d      Subscriber: pt Benefits:  Phone #: [email or web address]     Name: 02/10/14 Eff. Date: 05/13/96     Deduct: $1260      Out of Pocket Max: none      Life Max: none CIR: 100%      SNF: 20 full days Outpatient: 80%     Co-Pay: 20% Home Health: 100%      Co-Pay: none DME: 80%     Co-Pay: 20% Providers: pt choice  SECONDARY: Medicaid [place] Access      Policy#: [id number] p      Subscriber: pt  Emergency Contact Information Contact Information   Name Relation Home Work [place] Son   [place] Daughter   [phone]   [facility] Daughter   [phone]     Current Medical History  Patient Admitting Diagnosis: Bilateral cerebral watershed infarcts related to acute anemia from GIB  History of Present Illness: HPI: [name] is a 79 y.o. female with h/o CAD, DM type 2 with gastroparesis, CHF exacerbation 11/2103, GIB, early dementia who has had recent increase in falls with melena.   She was admitted on 04/25 /15 with expressive aphasia, lethargy as well as twisted lip. She was found to be in acute renal failure and anemic with hgb-5.6. She was treated with IVF and transfused with 2 unit PRBC. Patient with elevated cardiac enzyme that was felt to be multifactorial due to demand ischemia. 2 D echo with EF 40-45%. Dr. Jonnie Finner consulted for input on acute on chronic renal failure and recommended d/c ace as well as hydration as patient poor HD candidate.   Patient continued to be confused with fluctuating mentation and MRI brain done revealing multiple bilateral watershed infarcts. Carotid dopplers without ICA  stenosis. Neurology recommended ASA if GIB resolved. Dr. Carlean Purl consulted for input and EGD done on 02/08/14 Revealing erosive gastritis in gastric antrum. She's cleared to use low dose ASA in two weeks if needed. Neurology feels that patient with embolic infarcts due to A Fib and Eliquis 2.5 mg bid recommended if CrCL >30. Patient with poor renal recovery and nephrology recommended palliative care to discuss [place]. EEG with evidence of mild encephalopathy. Today with improvement in mentation 02/09/14. Patient with subsequent right neglect, moderate dysarthria, problems with initiation, attention as well as aphasia.    NSTEMI, likely type 2, with hx of known CAD: - Given her comorbidities, particularly her renal failure, she is not a good candidate for left heart catheterization or invasive procedure. Sinus brady on telemetry.  - Per cardiology  - No ACE inhibitor secondary to acute renal failure  - Aspirin held because of concern for GIB  - Nitroglycerin when necessary. ICM with EF 40 - 45%.  Past Medical History  Past Medical History  Diagnosis Date  . Asthma   . CAD (coronary artery disease)     Pt reports MI in 2006 (no documentation).  Cardiolite in 05/2002  and 07/2006 did not reveal any reversible ischemia.  Pt follows with Dr. Rex Kras at [facility].  . CHF (congestive heart failure)     EF 25-30% with dilated LV, mild LVH, severe hypokinesis, and mod-severe reduction in RV function  . Osteoporosis   . HYPERTENSION 08/03/2006  . GASTROPARESIS, DIABETIC 08/03/2006  . HYPERLIPIDEMIA 08/03/2006  . OBSTRUCTIVE SLEEP APNEA 01/06/2008  . PERIPHERAL NEUROPATHY 08/03/2006  . GERD 08/03/2006  . LOW BACK PAIN, CHRONIC 08/03/2006  . OSTEOPOROSIS 03/21/2009  . CEREBRAL EMBOLISM, WITH INFARCTION 07/02/2010  . Angina   . Myocardial infarction "2 or 3"  . Pneumonia 02/26/12    "a few times; probably even today"  . Shortness of breath     "all the time"  . DIABETES MELLITUS, TYPE II 11/04/1983  . Blood  transfusion 08/2011  . Lower GI bleeding 08/2011 and 01/2014  . Chronic daily headache   . Migraines   . Stroke summer 2011    "made my left hip worse"  . Uterine cancer   . Nonischemic cardiomyopathy 05/2010    Left heart catheterization:2011. Nonobstructive coronary artery disease.  . Pulmonary hypertension   . Hypertension     Family History  family history includes Diabetes insipidus in her mother; Hypertension in her child, father, mother, and sister.  Prior Rehab/Hospitalizations: none  Current Medications  Current facility-administered medications:acetaminophen (TYLENOL) tablet 650 mg, 650 mg, Oral, Q4H PRN, Rhetta Mura Schorr, NP, 650 mg at 02/08/14 2135;  amLODipine (NORVASC) tablet 10 mg, 10 mg, Oral, Daily, Charlynne Cousins, MD, 10 mg at 02/09/14 1145;  atorvastatin (LIPITOR) tablet 40 mg, 40 mg, Oral, q1800, Janece Canterbury, MD, 40 mg at 02/09/14 1723 feeding supplement (GLUCERNA SHAKE) (GLUCERNA SHAKE) liquid 237 mL, 237 mL, Oral, BID PRN, Baird Lyons, RD;  hydrALAZINE (APRESOLINE) injection 10 mg, 10 mg, Intravenous, Q6H PRN, Thurnell Lose, MD;  hydrOXYzine (ATARAX/VISTARIL) tablet 10 mg, 10 mg, Oral, TID PRN, Charlynne Cousins, MD, 10 mg at 02/09/14 1548;  insulin aspart (novoLOG) injection 0-9 Units, 0-9 Units, Subcutaneous, Q4H, Janece Canterbury, MD, 2 Units at 02/10/14 [id number] isosorbide mononitrate (IMDUR) 24 hr tablet 60 mg, 60 mg, Oral, Daily, Charlynne Cousins, MD, 60 mg at 02/09/14 1144;  metoprolol succinate (TOPROL-XL) 24 hr tablet 25 mg, 25 mg, Oral, Daily, Charlynne Cousins, MD, 25 mg at 02/09/14 1144;  multivitamin with minerals tablet 1 tablet, 1 tablet, Oral, Daily, Baird Lyons, RD, 1 tablet at 02/09/14 1145 nitroGLYCERIN (NITROSTAT) SL tablet 0.4 mg, 0.4 mg, Sublingual, Q5 min PRN, Thurnell Lose, MD;  ondansetron (ZOFRAN) injection 4 mg, 4 mg, Intravenous, Q6H PRN, Thurnell Lose, MD;  ondansetron (ZOFRAN) tablet 4 mg, 4 mg, Oral, Q6H PRN,  Thurnell Lose, MD;  pantoprazole sodium (PROTONIX) 40 mg/20 mL oral suspension 40 mg, 40 mg, Per Tube, Daily, Charlynne Cousins, MD  Patients Current Diet: Carb Control  Precautions / Restrictions Precautions Precautions: Fall Restrictions Weight Bearing Restrictions: No   Prior Activity Level Limited Community (1-2x/wk): pt active and MOd I RW in home. Family takes her out a couple times per week Family in and out all day. Pt cooks for herself. Family does bring in food at times. Bathes herself. Manages her own pills in pill box , checks her CBGS BID and uses insulin pen. Her Hgb A1C 11.5. Her son lives with her and is also a diabetic.  Home Assistive Devices / Equipment Home Assistive Devices/Equipment: Gilford Rile (specify type)  Prior Functional Level Prior Function  Level of Independence: Independent with assistive device(s) Comments: was modified independent wiht ADLs and with RW until recently, with falls leading up to this admission  Current Functional Level Cognition  Arousal/Alertness: Awake/alert Overall Cognitive Status: Impaired/Different from baseline Current Attention Level: Sustained Orientation Level: Oriented to person Following Commands: Follows one step commands inconsistently;Follows one step commands with increased time Safety/Judgement: Decreased awareness of deficits;Decreased awareness of safety Attention: Focused;Sustained Focused Attention: Impaired Focused Attention Impairment: Functional basic;Verbal basic Sustained Attention: Impaired Sustained Attention Impairment: Verbal basic;Functional basic Memory:  (NT) Awareness: Impaired Awareness Impairment: Intellectual impairment;Emergent impairment Problem Solving: Impaired Problem Solving Impairment: Verbal basic;Functional basic Behaviors: Perseveration Safety/Judgment: Impaired    Extremity Assessment (includes Sensation/Coordination)          ADLs  Overall ADL's : Needs  assistance/impaired Grooming: Wash/dry face;Wash/dry hands;Minimal assistance Upper Body Bathing: Moderate assistance;Sitting Lower Body Bathing: Total assistance Upper Body Dressing : Moderate assistance;Sitting Lower Body Dressing: Total assistance Toilet Transfer: Maximal assistance;Cueing for safety;Cueing for sequencing;Stand-pivot Toileting- Clothing Manipulation and Hygiene: Total assistance Functional mobility during ADLs: Maximal assistance;Cueing for safety;Cueing for sequencing    Mobility  Overal bed mobility: +2 for physical assistance Bed Mobility: Supine to Sit;Sit to Supine Supine to sit: Max assist;+2 for physical assistance Sit to supine: Total assist;+2 for physical assistance General bed mobility comments: Pt sitting in recliner upon arrival    Transfers  Overall transfer level: Needs assistance Equipment used: Rolling walker (2 wheeled) Transfers: Sit to/from Stand Sit to Stand: Min assist Stand pivot transfers: Max assist General transfer comment: cues for hand placement & technique.  (A) to achieve standing, balance, & controlled descent.      Ambulation / Gait / Stairs / Wheelchair Mobility  Ambulation/Gait Ambulation/Gait assistance: Museum/gallery curator (Feet): 35 Feet Assistive device: Rolling walker (2 wheeled) Gait Pattern/deviations: Step-through pattern;Decreased stride length Gait velocity: decreased General Gait Details: (A) for balance & to negotiate RW around obstacles.      Posture / Balance      Special needs/care consideration Bladder mgmt: continent bowel and bladder  Diabetic mgmt Hgb A1C 11.5. Checks her CBGs BID and uses insulin pen. [name] is also a diabetic and I have spoken with him about supervision with her meds. Uses weekly pill box that she manages.  [name] states her PCP is at [facility] and he wants that to be changed to internal medicine clinic which she was at [facility] about 3 years ago. [place]  her PCP does not manage her well.  Pt is a DNR on acute hospital this admission.   Previous Home Environment Living Arrangements: Children (son, [name], lives with pt for 4 years)  Lives With: Son Available Help at Discharge: Family;Available 24 hours/day Type of Home: Apartment Home Layout: One level Home Access: Stairs to enter Entrance Stairs-Rails: None Entrance Stairs-Number of Steps: 1 Bathroom Shower/Tub: Tub/shower unit;Other (comment) (with bench in tub; Hand held shower) Bathroom Toilet: Standard Bathroom Accessibility: No (pt leaves RW in hall and holds to wall and sink in bathroom) [place]: No Additional Comments: Pt takes on showers, cooks and self sufficient. Puts her own pills in pill box weeksly, checks her CBGS twice per day, gives own insulin via Pen BID. Family does laundry and runs her errands, pt has never driven  Discharge Living Setting Plans for Discharge Living Setting: Apartment Type of Home at Discharge: Apartment Discharge Home Layout: One level Discharge Home Access: Stairs to enter Entrance Stairs-Rails: None Entrance Stairs-Number of Steps: 1  Discharge Bathroom Shower/Tub: Tub/shower unit;Other (comment) (with chair in tub with hand held shower) Discharge Bathroom Toilet: Standard Discharge Bathroom Accessibility: No Does the patient have any problems obtaining your medications?: No  Social/Family/Support Systems Patient Roles: Parent Contact Information: see above Anticipated Caregiver: children and family Anticipated Caregiver's Contact Information: see above Ability/Limitations of Caregiver: son unemployed; family in and out all day as pt allows Caregiver Availability: 24/7 Discharge Plan Discussed with Primary Caregiver: Yes Is Caregiver In Agreement with Plan?: Yes Does Caregiver/Family have Issues with Lodging/Transportation while Pt is in Rehab?: No    Goals/Additional Needs Patient/Family Goal for Rehab: Supervision with  PT, OT, and SLP Expected length of stay: [facility] 14 to 18 days Special Service Needs: Son, [name] is also a diabetic and can assist with meds Pt/Family Agrees to Admission and willing to participate: Yes Program Orientation Provided & Reviewed with Pt/Caregiver Including Roles  & Responsibilities: Yes   Decrease burden of Care through IP rehab admission: n/a  Possible need for SNF placement upon discharge:Family chose [facility] for her rehab recovery for more intense therapy. Does not want SNF as previously planned at [facility].  Patient Condition: This patient's condition remains as documented in the consult dated 02/09/2014, in which the Rehabilitation Physician determined and documented that the patient's condition is appropriate for intensive rehabilitative care in an inpatient rehabilitation facility. Will admit to inpatient rehab today.  Preadmission Screen Completed By:  Cleatrice Burke, 02/10/2014 12:50 PM ______________________________________________________________________   Discussed status with Dr. Naaman Plummer on 02/10/14 at 1250 and received telephone approval for admission today.  Admission Coordinator:  Cleatrice Burke, time 1250 Date 02/10/2014.

## 2014-02-10 NOTE — Progress Notes (Signed)
   Having reviewed the patient's chart.  It would appear that she has made a notable recovery.  As part of her admission problems, she was noted to have a Type 2 MI due to anemia (supply vs. Demand ischemia).  Has known existing CAD.  Currently her BP remains elevated - would change from [facility] to [facility] for additional afterload reduction since no ACE-I or ARB recommended with Renal insufficiency.  No active cardiac issues besides BP.   Is on statin  Noted to have PAF - controlled rate; not on AC due to recent bleed; see Neurology Recs re: considering NOAC (Eliquis is the best recommendation given recent GI Bleed).    As she is intended to go to Rehab today -- [facility] will be available if assistance is required, but will sign off for now.  Will need Hospital F/u with either Dr. Gwenlyn Found or a PA/NP once d/c'd.  Please contact [facility] for assistance with scheduling.   Leonie Man, MD

## 2014-02-10 NOTE — Progress Notes (Signed)
Pt. Transferred to inpatient Rehab, pt. Stable and showed no signs or symptoms of distress or discomfort, Report called to receiving RN prior to transfer, pt. Belongings sent with family and pt to new unit.

## 2014-02-10 NOTE — Progress Notes (Signed)
Physical Medicine and Rehabilitation Consult  Reason for Consult: Aphasia and right neglect.  Referring Physician: Dr. Leonie Man.  HPI: [name] is a 80 y.o. female with h/o CAD, DM type 2 with gastroparesis, CHF exacerbation 11/2103, GIB, early dementia who has had recent increase in falls with melena. She was admitted on 04/25 /15 with expressive aphasia, lethargy as well as twisted lip. She was found to be in acute renal failure and anemic with hgb-5.6. She was treated with IVF and transfused with 2 unit PRBC. Patient with elevated cardiac enzyme that was felt to be multifactorial due to demand ischemia. 2 D echo with EF 40-45%. Dr. Jonnie Finner consulted for input on acute on chronic renal failure and recommended d/c ace as well as hydration as patient poor HD candidate. Patient continued to be confused with fluctuating mentation and MRI brain done revealing multiple bilateral watershed infarcts. Carotid dopplers without ICA stenosis. Neurology recommended ASA if GIB resolved. Dr. Carlean Purl consulted for input and EGD done on 02/08/14 Revealing erosive gastritis in gastric antrum. She's cleared to use low dose ASA in two weeks if needed. Neurology feels that patient with embolic infarcts due to A Fib and Eliquis 2.5 mg bid recommended if CrCL >30. Patient with poor renal recovery and nephrology recommended palliative care to discuss [place]. EEG with evidence of mild encephalopathy. Today with improvement in mentation. Patient with subsequent right neglect, moderate dysarthria, problems with initiation, attention as well as aphasia. MD, ST recommending CIR for progression.  Pt is pleasant, smiling, identifies daughter in room  Review of Systems  Eyes: Negative for blurred vision.  Respiratory: Negative for shortness of breath.  Cardiovascular: Negative for chest pain.  Gastrointestinal: Negative for nausea, abdominal pain and constipation.  Musculoskeletal: Positive for falls (occasionally due to BLE giving away.  ) and myalgias. Negative for back pain.  Neurological: Positive for speech change. Negative for dizziness.   Past Medical History   Diagnosis  Date   .  Asthma    .  CAD (coronary artery disease)      Pt reports MI in 2006 (no documentation). Cardiolite in 05/2002 and 07/2006 did not reveal any reversible ischemia. Pt follows with Dr. Rex Kras at [facility].   .  CHF (congestive heart failure)      EF 25-30% with dilated LV, mild LVH, severe hypokinesis, and mod-severe reduction in RV function   .  Osteoporosis    .  HYPERTENSION  08/03/2006   .  GASTROPARESIS, DIABETIC  08/03/2006   .  HYPERLIPIDEMIA  08/03/2006   .  OBSTRUCTIVE SLEEP APNEA  01/06/2008   .  PERIPHERAL NEUROPATHY  08/03/2006   .  GERD  08/03/2006   .  LOW BACK PAIN, CHRONIC  08/03/2006   .  OSTEOPOROSIS  03/21/2009   .  CEREBRAL EMBOLISM, WITH INFARCTION  07/02/2010   .  Angina    .  Myocardial infarction  "2 or 3"   .  Pneumonia  02/26/12     "a few times; probably even today"   .  Shortness of breath      "all the time"   .  DIABETES MELLITUS, TYPE II  11/04/1983   .  Blood transfusion  08/2011   .  Lower GI bleeding  08/2011 and 01/2014   .  Chronic daily headache    .  Migraines    .  Stroke  summer 2011     "made my left hip worse"   .  Uterine cancer    .  Nonischemic cardiomyopathy  05/2010     Left heart catheterization:2011. Nonobstructive coronary artery disease.   .  Pulmonary hypertension    .  Hypertension     Past Surgical History   Procedure  Laterality  Date   .  Esophagogastroduodenoscopy   08/26/2011     Procedure: ESOPHAGOGASTRODUODENOSCOPY (EGD); Surgeon: [name], MD; Location: [facility] ENDOSCOPY; Service: Endoscopy; Laterality: N/A;   .  Colonoscopy   08/28/2011     Procedure: COLONOSCOPY; Surgeon: [name], MD; Location: [place]; Service: Endoscopy; Laterality: N/A;   .  Vaginal hysterectomy     .  Tubal ligation     .  Cataract extraction w/ intraocular lens implant, bilateral     .   Toe surgery       "right big toe; operated on it to straighten it out; it was under"   .  Polysomnogram   10/17/2005     AHI-7.28/hr. AHI REM-20.8/hr. Average oxygen saturation range during REM and NREM was 97%. Lowest oxygen saturation during REM sleep was 90%.   .  Carotid duplex   05/28/2010     No significant extracranial carotid artery stenosis demonstrated. Vertebrals are patent w/ antegrade flow.   .  Cardiac catheterization   05/24/2010     No intervention - recommed medical therapy.   .  Cardiovascular stress test   08/07/2006     Moderate-severe defect seen in Basal inferior, Mid inferoseptal, Mid inferior, Mid inferolateral, and Apical inferior regions - consistent w/ infarct/scar. No scintigraphic evidence of inducible myocardial ischemia.   .  Transthoracic echocardiogram   08/29/2011     EF 55-60%, moderate LVH,   .  Esophagogastroduodenoscopy  N/A  02/08/2014     Procedure: ESOPHAGOGASTRODUODENOSCOPY (EGD); Surgeon: [name], MD; Location: [facility] ENDOSCOPY; Service: Endoscopy; Laterality: N/A;    Family History   Problem  Relation  Age of Onset   .  Diabetes insipidus  Mother    .  Hypertension  Mother    .  Hypertension  Father    .  Hypertension  Sister    .  Hypertension  Child     Social History: Lives with family. Needed SBA when ambulating with walker. She reports that she has never smoked. She quit smokeless tobacco use about 7 years ago. Her smokeless tobacco use included Snuff. She reports that she does not drink alcohol or use illicit drugs.  Allergies   Allergen  Reactions   .  Baking Soda-Fluoride [Sodium Fluoride]  Nausea And Vomiting   .  Magnesium Hydroxide  Nausea And Vomiting    Medications Prior to Admission   Medication  Sig  Dispense  Refill   .  acetaminophen (TYLENOL) 500 MG tablet  Take 500 mg by mouth every 6 (six) hours as needed for moderate pain or headache.     .  albuterol (PROAIR HFA) 108 (90 BASE) MCG/ACT inhaler  Inhale 2 puffs into the  lungs every 6 (six) hours as needed for wheezing.  8.5 g  11   .  amLODipine (NORVASC) 10 MG tablet  Take 1 tablet (10 mg total) by mouth daily.  30 tablet  2   .  aspirin 81 MG EC tablet  Take 81 mg by mouth daily.     .  insulin aspart (NOVOLOG FLEXPEN) 100 UNIT/ML FlexPen  Inject 7 Units into the skin daily with breakfast.     .  insulin glargine (LANTUS) 100 UNIT/ML injection  Inject 4-34  Units into the skin 2 (two) times daily. Inject 4 units at dinner and 34 units at bedtime     .  isosorbide mononitrate (IMDUR) 60 MG 24 hr tablet  Take 1 tablet (60 mg total) by mouth daily.  30 tablet  2   .  lisinopril (PRINIVIL,ZESTRIL) 5 MG tablet  Take 5 mg by mouth daily.     Marland Kitchen  loratadine (ALLERGY RELIEF) 10 MG tablet  Take 10 mg by mouth daily.     .  metoprolol succinate (TOPROL-XL) 25 MG 24 hr tablet  Take 1 tablet (25 mg total) by mouth daily.  30 tablet  0   .  Multiple Vitamins-Minerals (ALIVE WOMENS 50+ PO)  Take 1 tablet by mouth daily.     .  nitroGLYCERIN (NITROSTAT) 0.4 MG SL tablet  Place 1 tablet (0.4 mg total) under the tongue every 5 (five) minutes as needed. For chest pain.  30 tablet  3   .  polyethylene glycol (MIRALAX / GLYCOLAX) packet  Take 17 g by mouth daily.     .  potassium chloride (K-DUR) 10 MEQ tablet  Take 10 mEq by mouth daily.     .  rosuvastatin (CRESTOR) 20 MG tablet  Take 1 tablet (20 mg total) by mouth daily.  30 tablet  2   .  torsemide (DEMADEX) 10 MG tablet  Take 10 mg by mouth daily.     .  BD INSULIN SYRINGE ULTRAFINE 31G X 5/16" 0.3 ML MISC       Home:  Home Living  Family/patient expects to be discharged to:: Skilled nursing facility  Living Arrangements: Children  Available Help at Discharge: Family  Lives With: Son  Functional History:  Prior Function  Level of Independence: Independent with assistive device(s)  Comments: was modified independent wiht ADLs and with RW until recently, with falls leading up to this admission  Functional Status:   Mobility:  Bed Mobility  Overal bed mobility: +2 for physical assistance  Bed Mobility: Supine to Sit;Sit to Supine  Supine to sit: Max assist;+2 for physical assistance  Sit to supine: Total assist;+2 for physical assistance  General bed mobility comments: cues for sequencing & technique. (A) to lift shoulders/trunk to sitting upright, pivot hips around to EOB, & to lift LE's back into bed.  Transfers  Overall transfer level: Needs assistance  Transfers: Sit to/from Stand;Stand Pivot Transfers  Sit to Stand: Max assist;+2 safety/equipment  Stand pivot transfers: Max assist  General transfer comment: cues for sequencing & technique. 1st trial of SPT bed>3-in-1 +2 was used & 2nd trial performed with patient-therapist face-to-face positioning.    ADL:  ADL  Overall ADL's : Needs assistance/impaired  Grooming: Wash/dry face;Wash/dry hands;Minimal assistance  Upper Body Bathing: Moderate assistance;Sitting  Lower Body Bathing: Total assistance  Upper Body Dressing : Moderate assistance;Sitting  Lower Body Dressing: Total assistance  Toilet Transfer: Maximal assistance;Cueing for safety;Cueing for sequencing;Stand-pivot  Toileting- Clothing Manipulation and Hygiene: Total assistance  Functional mobility during ADLs: Maximal assistance;Cueing for safety;Cueing for sequencing  Cognition:  Cognition  Overall Cognitive Status: Impaired/Different from baseline  Arousal/Alertness: Awake/alert  Orientation Level: Oriented to person;Disoriented to time;Disoriented to place;Disoriented to situation  Attention: Focused;Sustained  Focused Attention: Impaired  Focused Attention Impairment: Functional basic;Verbal basic  Sustained Attention: Impaired  Sustained Attention Impairment: Verbal basic;Functional basic  Memory: (NT)  Awareness: Impaired  Awareness Impairment: Intellectual impairment;Emergent impairment  Problem Solving: Impaired  Problem Solving Impairment: Verbal basic;Functional  basic  Behaviors: Perseveration  Safety/Judgment: Impaired  Cognition  Arousal/Alertness: Awake/alert  Behavior During Therapy: Flat affect  Overall Cognitive Status: Impaired/Different from baseline  Area of Impairment: Attention;Following commands;Problem solving  Current Attention Level: Focused  Following Commands: Follows one step commands inconsistently;Follows one step commands with increased time  Problem Solving: Slow processing;Decreased initiation;Difficulty sequencing;Requires verbal cues;Requires tactile cues  Blood pressure 150/55, pulse 51, temperature 98.5 F (36.9 C), temperature source Oral, resp. rate 18, height 5\' 2"  (1.575 m), weight 74.5 kg (164 lb 3.9 oz), last menstrual period 12/19/1968, SpO2 100.00%.  Physical Exam  Nursing note and vitals reviewed.  Constitutional: She appears well-developed and well-nourished.  HENT:  Head: Normocephalic and atraumatic.  Eyes: Conjunctivae are normal. Pupils are equal, round, and reactive to light.  Neck: Normal range of motion. Neck supple.  Cardiovascular: Normal rate and regular rhythm.  No murmur heard.  Respiratory: Effort normal and breath sounds normal. No respiratory distress. She has no wheezes.  GI: Soft. Bowel sounds are normal. She exhibits no distension.  Musculoskeletal: She exhibits no tenderness.  Neurological: She is alert.  Oriented to self and place. Was able to ID family members and state DOB but not age. Unable to follow two step commands. Impulsive and needs occasional redirection. RUE inattention.  Skin: Skin is warm and dry.  needs cues for limited orientation  Motor strength 4/5 in bilateral delt bi tri grip HF KE ADF  Sensory is difficult to assess secondary to cognition but does correctly identify light touch on both feet  No ataxia noted FNF  Results for orders placed during the hospital encounter of 02/04/14 (from the past 24 hour(s))   GLUCOSE, CAPILLARY Status: Abnormal    Collection Time     02/08/14 1:06 PM   Result  Value  Ref Range    Glucose-Capillary  153 (*)  70 - 99 mg/dL    Comment 1  Documented in Chart     Comment 2  Notify RN    GLUCOSE, CAPILLARY Status: Abnormal    Collection Time    02/08/14 4:38 PM   Result  Value  Ref Range    Glucose-Capillary  153 (*)  70 - 99 mg/dL    Comment 1  Documented in Chart     Comment 2  Notify RN    GLUCOSE, CAPILLARY Status: Abnormal    Collection Time    02/08/14 7:36 PM   Result  Value  Ref Range    Glucose-Capillary  165 (*)  70 - 99 mg/dL   GLUCOSE, CAPILLARY Status: Abnormal    Collection Time    02/09/14 1:29 AM   Result  Value  Ref Range    Glucose-Capillary  166 (*)  70 - 99 mg/dL    Comment 1  Notify RN    GLUCOSE, CAPILLARY Status: Abnormal    Collection Time    02/09/14 1:57 AM   Result  Value  Ref Range    Glucose-Capillary  176 (*)  70 - 99 mg/dL    Comment 1  Notify RN     Comment 2  Documented in Chart    GLUCOSE, CAPILLARY Status: Abnormal    Collection Time    02/09/14 3:46 AM   Result  Value  Ref Range    Glucose-Capillary  161 (*)  70 - 99 mg/dL   COMPREHENSIVE METABOLIC PANEL Status: Abnormal    Collection Time    02/09/14 6:50 AM   Result  Value  Ref Range    Sodium  143  137 - 147 mEq/L    Potassium  4.3  3.7 - 5.3 mEq/L    Chloride  106  96 - 112 mEq/L    CO2  20  19 - 32 mEq/L    Glucose, Bld  170 (*)  70 - 99 mg/dL    BUN  68 (*)  6 - 23 mg/dL    Creatinine, Ser  4.62 (*)  0.50 - 1.10 mg/dL    Calcium  8.9  8.4 - 10.5 mg/dL    Total Protein  6.5  6.0 - 8.3 g/dL    Albumin  2.5 (*)  3.5 - 5.2 g/dL    AST  33  0 - 37 U/L    ALT  81 (*)  0 - 35 U/L    Alkaline Phosphatase  84  39 - 117 U/L    Total Bilirubin  0.9  0.3 - 1.2 mg/dL    GFR calc non Af Amer  8 (*)  >90 mL/min    GFR calc Af Amer  10 (*)  >90 mL/min   CBC Status: Abnormal    Collection Time    02/09/14 6:50 AM   Result  Value  Ref Range    WBC  8.3  4.0 - 10.5 K/uL    RBC  3.81 (*)  3.87 - 5.11 MIL/uL     Hemoglobin  8.7 (*)  12.0 - 15.0 g/dL    HCT  27.6 (*)  36.0 - 46.0 %    MCV  72.4 (*)  78.0 - 100.0 fL    MCH  22.8 (*)  26.0 - 34.0 pg    MCHC  31.5  30.0 - 36.0 g/dL    RDW  23.1 (*)  11.5 - 15.5 %    Platelets  261  150 - 400 K/uL   LIPID PANEL Status: None    Collection Time    02/09/14 6:50 AM   Result  Value  Ref Range    Cholesterol  130  0 - 200 mg/dL    Triglycerides  61  <150 mg/dL    HDL  60  >39 mg/dL    Total CHOL/HDL Ratio  2.2     VLDL  12  0 - 40 mg/dL    LDL Cholesterol  58  0 - 99 mg/dL   GLUCOSE, CAPILLARY Status: None    Collection Time    02/09/14 7:24 AM   Result  Value  Ref Range    Glucose-Capillary  73  70 - 99 mg/dL   GLUCOSE, CAPILLARY Status: Abnormal    Collection Time    02/09/14 10:51 AM   Result  Value  Ref Range    Glucose-Capillary  262 (*)  70 - 99 mg/dL    Comment 1  Notify RN     No results found.  Assessment/Plan:  Diagnosis: Bilateral cerebral watershed infarcts related to acute anemia from GIB  1. Does the need for close, 24 hr/day medical supervision in concert with the patient's rehab needs make it unreasonable for this patient to be served in a less intensive setting? Yes 2. Co-Morbidities requiring supervision/potential complications: AFib, DM,ARF 3. Due to bladder management, bowel management, safety, skin/wound care, disease management, medication administration and patient education, does the patient require 24 hr/day rehab nursing? Yes 4. Does the patient require coordinated care of a physician, rehab nurse, PT (1-2 hrs/day, 5 days/week), OT (1-2 hrs/day, 5 days/week) and SLP (.5-1 hrs/day, 5 days/week) to address  physical and functional deficits in the context of the above medical diagnosis(es)? Yes Addressing deficits in the following areas: balance, endurance, locomotion, strength, transferring, bowel/bladder control, bathing, dressing, feeding, grooming, toileting, cognition and language 5. Can the patient actively participate  in an intensive therapy program of at least 3 hrs of therapy per day at least 5 days per week? Yes 6. The potential for patient to make measurable gains while on inpatient rehab is good 7. Anticipated functional outcomes upon discharge from inpatient rehab are supervision with PT, supervision with OT, supervision with SLP. 8. Estimated rehab length of stay to reach the above functional goals is: 14-18 days 9. Does the patient have adequate social supports to accommodate these discharge functional goals? Yes 10. Anticipated D/C setting: Home 11. Anticipated post D/C treatments: [place] therapy 12. Overall Rehab/Functional Prognosis: good RECOMMENDATIONS:  This patient's condition is appropriate for continued rehabilitative care in the following setting: CIR  Patient has agreed to participate in recommended program. Yes  Note that insurance prior authorization may be required for reimbursement for recommended care.  Comment: Was at Supervision level PTA  02/09/2014

## 2014-02-10 NOTE — Progress Notes (Signed)
Stroke Team Progress Note  HISTORY [name] is an 80 y.o. female presented with an weakness, increased falls, melena. Just prior to admission she developed expressive aphasia, twisted lip, and lethargy. While in hospital she was found to have acute blood loss anemia, Hemoccult-positive, hgb drifting down. Previous endoscopies for similar bleeding/anemia demonstrated erosive gastritis. MRI was obtained showing multiple foci of abnormal signal primarily in the bilateral cerebral white matter consistent with watershed infarct. Currently she is not on antiplatelet due to GI bleed and scheduled for EGD tomorrow.  Neuro work up while in hospital showed normal EEG, recent carotid doppler 12/28/13 showing no ICA stenosis and Echo on 12/28/13 showing EF 40-45% with no mention of PFO. Of note--family is going to have a palliative care meeting. last known well unable to determined. Patient was not administered TPA secondary to unknown time of onset.   SUBJECTIVE No changes. Going to rehab likely  today  OBJECTIVE Most recent Vital Signs: Filed Vitals:   02/09/14 1500 02/09/14 2112 02/10/14 0443 02/10/14 0444  BP: 133/49 124/63 135/51   Pulse: 59 55 62   Temp: 98 F (36.7 C) 98.3 F (36.8 C) 97.9 F (36.6 C)   TempSrc: Oral Oral Oral   Resp: 18 18 19    Height:      Weight:    75.841 kg (167 lb 3.2 oz)  SpO2: 100% 98% 96%    CBG (last 3)   Recent Labs  02/10/14 0042 02/10/14 0441 02/10/14 0728  GLUCAP 180* 191* 162*    IV Fluid Intake:      MEDICATIONS  . amLODipine  10 mg Oral Daily  . atorvastatin  40 mg Oral q1800  . insulin aspart  0-9 Units Subcutaneous Q4H  . isosorbide mononitrate  60 mg Oral Daily  . metoprolol succinate  25 mg Oral Daily  . multivitamin with minerals  1 tablet Oral Daily  . pantoprazole sodium  40 mg Per Tube Daily   PRN:  acetaminophen, feeding supplement (GLUCERNA SHAKE), hydrALAZINE, hydrOXYzine, nitroGLYCERIN, ondansetron (ZOFRAN) IV,  ondansetron  Diet:  Carb Control thin liquids Activity:  Up with assistance DVT Prophylaxis:  SCDs   CLINICALLY SIGNIFICANT STUDIES Basic Metabolic Panel:   Recent Labs Lab 02/09/14 0650 02/10/14 0630  NA 143 140  K 4.3 4.0  CL 106 102  CO2 20 23  GLUCOSE 170* 176*  BUN 68* 62*  CREATININE 4.62* 4.42*  CALCIUM 8.9 8.9   Liver Function Tests:   Recent Labs Lab 02/09/14 0650 02/10/14 0630  AST 33 20  ALT 81* 66*  ALKPHOS 84 85  BILITOT 0.9 0.7  PROT 6.5 6.7  ALBUMIN 2.5* 2.7*   CBC:  Recent Labs Lab 02/04/14 1158  02/08/14 0458 02/09/14 0650  WBC 9.1  < > 8.9 8.3  NEUTROABS 6.3  --   --   --   HGB 6.2*  < > 8.3* 8.7*  HCT 19.9*  < > 26.4* 27.6*  MCV 65.0*  < > 72.1* 72.4*  PLT 283  < > 196 261  < > = values in this interval not displayed. Coagulation: No results found for this basename: LABPROT, INR,  in the last 168 hours Cardiac Enzymes:   Recent Labs Lab 02/05/14 0655 02/05/14 1650 02/05/14 2330  TROPONINI 0.83* 1.83* 1.55*   Urinalysis:   Recent Labs Lab 02/04/14 1203  COLORURINE YELLOW  LABSPEC 1.018  PHURINE 5.0  GLUCOSEU NEGATIVE  HGBUR NEGATIVE  BILIRUBINUR NEGATIVE  KETONESUR NEGATIVE  PROTEINUR 100*  UROBILINOGEN 0.2  NITRITE NEGATIVE  LEUKOCYTESUR NEGATIVE   Lipid Panel    Component Value Date/Time   CHOL 130 02/09/2014 0650   TRIG 61 02/09/2014 0650   HDL 60 02/09/2014 0650   CHOLHDL 2.2 02/09/2014 0650   VLDL 12 02/09/2014 0650   LDLCALC 58 02/09/2014 0650   HgbA1C  Lab Results  Component Value Date   HGBA1C 11.5* 02/04/2014    Urine Drug Screen:     Component Value Date/Time   LABOPIA NONE DETECTED 07/10/2008 1721   COCAINSCRNUR NONE DETECTED 07/10/2008 1721   LABBENZ NONE DETECTED 07/10/2008 1721   AMPHETMU NONE DETECTED 07/10/2008 1721   THCU NONE DETECTED 07/10/2008 1721   LABBARB  Value: NONE DETECTED        DRUG SCREEN FOR MEDICAL PURPOSES ONLY.  IF CONFIRMATION IS NEEDED FOR ANY PURPOSE, NOTIFY LAB WITHIN 5 DAYS.  07/10/2008 1721    Alcohol Level: No results found for this basename: ETH,  in the last 168 hours  Ct Abdomen Pelvis Wo Contrast 02/05/2014   1. Haziness about the gallbladder could reflect gallbladder wall thickening, or could be artifactual in nature. Evaluation is mildly suboptimal due to motion artifact. Would correlate for associated symptoms. 2. No evidence of hydronephrosis; kidneys grossly unremarkable in appearance. 3. Diffuse calcification along the abdominal aorta and its branches, including along the superior mesenteric artery. 4. Diffuse coronary artery calcifications seen. 5. Trace right-sided pleural fluid noted. 6. Degenerative change noted at the left hip.       CT of the brain  02/04/2014    No evidence of acute intracranial abnormality.  Age related atrophy with small vessel ischemic changes and intracranial atherosclerosis.     MRI of the brain  02/07/2014   Truncated and motion degraded exam demonstrating multiple foci of abnormal signal primarily in the bilateral cerebral white matter.  Top differential considerations are acute watershed ischemia (such as due to recent hypotensive event) and sequelae of embolic event (although no posterior fossa involvement identified).    MRA of the brain    2D Echocardiogram  12/28/2013 EF 40-45% with no source of embolus.  Carotid Doppler  12/28/2013 No evidence of hemodynamically significant internal carotid artery stenosis. Vertebral artery flow is antegrade.   CXR  02/04/2014    Moderate enlargement of the cardiac silhouette.  Lungs clear.     EKG  atrial fibrillation, rate 65. For complete results please see formal report.   Therapy Recommendations SNF  Physical Exam   Elderly lady not in distress.Awake alert. Afebrile. Head is nontraumatic. Neck is supple without bruit. Hearing is normal. Cardiac exam no murmur or gallop. Lungs are clear to auscultation. Distal pulses are well felt. Neurological Exam : Awake alert oriented x2. Mild  dysarthria but can easily be understood. No aphasia. Follows commands well. Extraocular movements full range without nystagmus. Mild right lower facial asymmetry. Tongue midline. Motor system exam no upper or lower extremity drift with mild weakness of right grip, intrinsic hand muscles, right hip flexor and ankle dorsiflexors. Coordination slow on the right compared to the left. Sensation intact. Plantars both downgoing. Gait was not tested.  ASSESSMENT [name] is a 80 y.o. female admitted for weakness, increased falls and melena who also hads expressive aphasia, facial weakness and lethargy. Radiology read MRI as bilateral watershed infarcts; infarcts are bilateral, but thought to be scattered embolic infarct secondary to atrial fibrillation as there is no documented hypotension or carotid stenosis.  On aspirin 81 mg orally  every day prior to admission. Now on no antithrombotics for secondary stroke prevention. Patient with resultant expressive aphasia. Stroke work up completed.   atrial fibrillation, new finding  She was on coumadin during her 05/30/2010 admission when she had a post cath stroke that was thought to be embolic. It is documented that Dr. Claiborne Billings felt she had risk of thrombus given low EF. She was not seen by neurology. She was on coumadin up until 1.5 yrs ago when it was stopped due to GI hemorrhage.    Acute blood loss anemia  Previous endoscopies for similar bleeding/anemia demonstrated erosive gastritis and normal colonoscopy.   Stool guiac positive  hgb stable  Endoscopy - Erosive gastritis (inflammation) was found in the gastric antrum; RECOMMENDATIONS: Stay on PPI; ok to use 81 mg ASA daily in 2 weeks, avoid NSAID's   hypertension Hyperlipidemia, LDL 58, on crestor 20 mg daily PTA, now on lipitor 40 mg daily, at goal LDL < 70 for diabetics Diabetes, HgbA1c 11.5, goal < 7.0 CAD - NSTEMI this admission, hx MI Migraines obstructive sleep apnea  Hx stroke -  summer 2011 CHF - ICM w/ EF 40-45% Acute renal failure  Elevated LFTs, likely shock liver from acute event, improving. Plan recheck in 1 week  Hospital day # 6  TREATMENT/PLAN  Recommend low dose aspirin in 2 weeks as per GI  Not a NOAC candidate currently give elevated GFR. Once GFR improves, change to NOAC - eliquis 2.5 mg bid.  Ongoing monitoring for GIB  Dispo:  CIR vs SNF. Rehab admissions coordinator is following with family     I have personally obtained a history, examined the patient, evaluated imaging results, and formulated the assessment and plan of care. I agree with the above.  Antony Contras, MD   To contact Stroke Continuity provider, please refer to [email or web address] After hours, contact General Neurology

## 2014-02-10 NOTE — Progress Notes (Signed)
Physical Therapy Treatment Patient Details Name: [name] MRN: [id number] DOB: 04/20/1933 Today's Date: 02/10/2014    History of Present Illness Metabolic encephalopathy with generalized weakness and multiple falls, likely due to combination of acute renal failure, NSTEMI, dehydration, and metabolic acidosis, possible lactic acidosis, GI bleed;  feeling weak for the last few weeks, few falls at home, at baseline she uses a walker and has early dementia.    PT Comments    Pt progressing with mobility today as she was able to perform sit<>stand transfers with decreased assist & ambulate ~35' using RW with min assist.  Per chart, pt now being reviewed for CIR.  D/c plans updated.    Follow Up Recommendations  CIR     Equipment Recommendations  3in1 (PT)    Recommendations for Other Services       Precautions / Restrictions Precautions Precautions: Fall Restrictions Weight Bearing Restrictions: No    Mobility  Bed Mobility               General bed mobility comments: Pt sitting in recliner upon arrival  Transfers Overall transfer level: Needs assistance Equipment used: Rolling walker (2 wheeled) Transfers: Sit to/from Stand Sit to Stand: Min assist         General transfer comment: cues for hand placement & technique.  (A) to achieve standing, balance, & controlled descent.    Ambulation/Gait Ambulation/Gait assistance: Min assist Ambulation Distance (Feet): 35 Feet Assistive device: Rolling walker (2 wheeled) Gait Pattern/deviations: Step-through pattern;Decreased stride length Gait velocity: decreased   General Gait Details: (A) for balance & to negotiate RW around obstacles.  cues for safety as Pt taking hands off RW at times & reaching for rail in hallway.     Stairs            Wheelchair Mobility    Modified Rankin (Stroke Patients Only)       Balance                                    Cognition Arousal/Alertness:  Awake/alert Behavior During Therapy: Flat affect Overall Cognitive Status: Impaired/Different from baseline Area of Impairment: Attention;Following commands;Safety/judgement;Problem solving   Current Attention Level: Sustained   Following Commands: Follows one step commands inconsistently;Follows one step commands with increased time Safety/Judgement: Decreased awareness of deficits;Decreased awareness of safety   Problem Solving: Slow processing;Decreased initiation;Difficulty sequencing;Requires verbal cues;Requires tactile cues      Exercises      General Comments        Pertinent Vitals/Pain No pain reported.      Home Living   Living Arrangements: Children (son, [name], lives with pt for 4 years) Available Help at Discharge: Family;Available 24 hours/day Type of Home: Apartment Home Access: Stairs to enter Entrance Stairs-Rails: None Home Layout: One level   Additional Comments: Pt takes on showers, cooks and self sufficient. Puts her own pills in pill box weeksly, checks her CBGS twice per day, gives own insulin via Pen BID. Family does laundry and runs her errands, pt has never driven    Prior Function            PT Goals (current goals can now be found in the care plan section) Acute Rehab PT Goals Patient Stated Goal: none stated PT Goal Formulation: With patient/family Time For Goal Achievement: 02/20/14 Potential to Achieve Goals: Good Progress towards PT goals: Progressing toward goals  Frequency  Min 3X/week    PT Plan Discharge plan needs to be updated    Co-evaluation             End of Session Equipment Utilized During Treatment: Gait belt Activity Tolerance: Patient tolerated treatment well Patient left: in chair;with call bell/phone within reach;with chair alarm set     Time: [id number] PT Time Calculation (min): 13 min  Charges:  $Gait Training: 8-22 mins                      Sena Hitch 02/10/2014, 12:09 PM  Sarajane Marek, PTA [phone] 02/10/2014

## 2014-02-10 NOTE — Progress Notes (Signed)
PMR Admission Coordinator Pre-Admission Assessment  Patient: [name] is an 80 y.o., female  MRN: [id number]  DOB: August 22, 1933  Height: 5\' 2"  (157.5 cm)  Weight: 75.841 kg (167 lb 3.2 oz) (bed scale)  Insurance Information  HMO: PPO: PCP: IPA: 80/20: yes OTHER: no HMO  PRIMARY: Medicare a and b Policy#: [id number] d Subscriber: pt  Benefits: Phone #: [email or web address] Name: 02/10/14  Eff. Date: 05/13/96 Deduct: $1260 Out of Pocket Max: none Life Max: none  CIR: 100% SNF: 20 full days  Outpatient: 80% Co-Pay: 20%  Home Health: 100% Co-Pay: none  DME: 80% Co-Pay: 20%  Providers: pt choice   SECONDARY: Medicaid Lane Access Policy#: [id number] p Subscriber: pt  Emergency Contact Information  Contact Information    Name  Relation  Home  Work  [place]  Son    [facility]  Daughter    [phone]    [facility]  Daughter    [phone]      Current Medical History  Patient Admitting Diagnosis: Bilateral cerebral watershed infarcts related to acute anemia from GIB  History of Present Illness: HPI: [name] is a 80 y.o. female with h/o CAD, DM type 2 with gastroparesis, CHF exacerbation 11/2103, GIB, early dementia who has had recent increase in falls with melena.  She was admitted on 04/25 /15 with expressive aphasia, lethargy as well as twisted lip. She was found to be in acute renal failure and anemic with hgb-5.6. She was treated with IVF and transfused with 2 unit PRBC. Patient with elevated cardiac enzyme that was felt to be multifactorial due to demand ischemia. 2 D echo with EF 40-45%. Dr. Jonnie Finner consulted for input on acute on chronic renal failure and recommended d/c ace as well as hydration as patient poor HD candidate.  Patient continued to be confused with fluctuating mentation and MRI brain done revealing multiple bilateral watershed infarcts. Carotid dopplers without ICA stenosis. Neurology recommended ASA if GIB resolved. Dr. Carlean Purl consulted  for input and EGD done on 02/08/14 Revealing erosive gastritis in gastric antrum. She's cleared to use low dose ASA in two weeks if needed. Neurology feels that patient with embolic infarcts due to A Fib and Eliquis 2.5 mg bid recommended if CrCL >30. Patient with poor renal recovery and nephrology recommended palliative care to discuss [place]. EEG with evidence of mild encephalopathy. Today with improvement in mentation 02/09/14. Patient with subsequent right neglect, moderate dysarthria, problems with initiation, attention as well as aphasia.  NSTEMI, likely type 2, with hx of known CAD:  - Given her comorbidities, particularly her renal failure, she is not a good candidate for left heart catheterization or invasive procedure. Sinus brady on telemetry.  - Per cardiology  - No ACE inhibitor secondary to acute renal failure  - Aspirin held because of concern for GIB  - Nitroglycerin when necessary.  ICM with EF 40 - 45%.  Past Medical History  Past Medical History   Diagnosis  Date   .  Asthma    .  CAD (coronary artery disease)      Pt reports MI in 2006 (no documentation). Cardiolite in 05/2002 and 07/2006 did not reveal any reversible ischemia. Pt follows with Dr. Rex K[facility] at [facility].   .  CHF (congestive heart failure)      EF 25-30% with dilated LV, mild LVH, severe hypokinesis, and mod-severe reduction in RV function   .  Osteoporosis    .  HYPERTENSION  08/03/2006   .  GASTROPARESIS, DIABETIC  08/03/2006   .  HYPERLIPIDEMIA  08/03/2006   .  OBSTRUCTIVE SLEEP APNEA  01/06/2008   .  PERIPHERAL NEUROPATHY  08/03/2006   .  GERD  08/03/2006   .  LOW BACK PAIN, CHRONIC  08/03/2006   .  OSTEOPOROSIS  03/21/2009   .  CEREBRAL EMBOLISM, WITH INFARCTION  07/02/2010   .  Angina    .  Myocardial infarction  "2 or 3"   .  Pneumonia  02/26/12     "a few times; probably even today"   .  Shortness of breath      "all the time"   .  DIABETES MELLITUS, TYPE II  11/04/1983   .  Blood transfusion  08/2011   .   Lower GI bleeding  08/2011 and 01/2014   .  Chronic daily headache    .  Migraines    .  Stroke  summer 2011     "made my left hip worse"   .  Uterine cancer    .  Nonischemic cardiomyopathy  05/2010     Left heart catheterization:2011. Nonobstructive coronary artery disease.   .  Pulmonary hypertension    .  Hypertension     Family History  family history includes Diabetes insipidus in her mother; Hypertension in her child, father, mother, and sister.  Prior Rehab/Hospitalizations: none  Current Medications  Current facility-administered medications:acetaminophen (TYLENOL) tablet 650 mg, 650 mg, Oral, Q4H PRN, Rhetta Mura Schorr, NP, 650 mg at 02/08/14 2135; amLODipine (NORVASC) tablet 10 mg, 10 mg, Oral, Daily, Charlynne Cousins, MD, 10 mg at 02/09/14 1145; atorvastatin (LIPITOR) tablet 40 mg, 40 mg, Oral, q1800, Janece Canterbury, MD, 40 mg at 02/09/14 1723  feeding supplement (GLUCERNA SHAKE) (GLUCERNA SHAKE) liquid 237 mL, 237 mL, Oral, BID PRN, Baird Lyons, RD; hydrALAZINE (APRESOLINE) injection 10 mg, 10 mg, Intravenous, Q6H PRN, Thurnell Lose, MD; hydrOXYzine (ATARAX/VISTARIL) tablet 10 mg, 10 mg, Oral, TID PRN, Charlynne Cousins, MD, 10 mg at 02/09/14 1548; insulin aspart (novoLOG) injection 0-9 Units, 0-9 Units, Subcutaneous, Q4H, Janece Canterbury, MD, 2 Units at 02/10/14 7893  isosorbide mononitrate (IMDUR) 24 hr tablet 60 mg, 60 mg, Oral, Daily, Charlynne Cousins, MD, 60 mg at 02/09/14 1144; metoprolol succinate (TOPROL-XL) 24 hr tablet 25 mg, 25 mg, Oral, Daily, Charlynne Cousins, MD, 25 mg at 02/09/14 1144; multivitamin with minerals tablet 1 tablet, 1 tablet, Oral, Daily, Baird Lyons, RD, 1 tablet at 02/09/14 1145  nitroGLYCERIN (NITROSTAT) SL tablet 0.4 mg, 0.4 mg, Sublingual, Q5 min PRN, Thurnell Lose, MD; ondansetron (ZOFRAN) injection 4 mg, 4 mg, Intravenous, Q6H PRN, Thurnell Lose, MD; ondansetron (ZOFRAN) tablet 4 mg, 4 mg, Oral, Q6H PRN, Thurnell Lose,  MD; pantoprazole sodium (PROTONIX) 40 mg/20 mL oral suspension 40 mg, 40 mg, Per Tube, Daily, Charlynne Cousins, MD  Patients Current Diet: Carb Control  Precautions / Restrictions  Precautions  Precautions: Fall  Restrictions  Weight Bearing Restrictions: No  Prior Activity Level  Limited Community (1-2x/wk): pt active and MOd I RW in home. Family takes her out a couple times per week  Family in and out all day. Pt cooks for herself. Family does bring in food at times. Bathes herself. Manages her own pills in pill box , checks her CBGS BID and uses insulin pen. Her Hgb A1C 11.5. Her son lives with her and is also a diabetic.  Home Assistive Devices / Equipment  Home Assistive Devices/Equipment:  Walker (specify type)  Prior Functional Level  Prior Function  Level of Independence: Independent with assistive device(s)  Comments: was modified independent wiht ADLs and with RW until recently, with falls leading up to this admission  Current Functional Level  Cognition  Arousal/Alertness: Awake/alert  Overall Cognitive Status: Impaired/Different from baseline  Current Attention Level: Sustained  Orientation Level: Oriented to person  Following Commands: Follows one step commands inconsistently;Follows one step commands with increased time  Safety/Judgement: Decreased awareness of deficits;Decreased awareness of safety  Attention: Focused;Sustained  Focused Attention: Impaired  Focused Attention Impairment: Functional basic;Verbal basic  Sustained Attention: Impaired  Sustained Attention Impairment: Verbal basic;Functional basic  Memory: (NT)  Awareness: Impaired  Awareness Impairment: Intellectual impairment;Emergent impairment  Problem Solving: Impaired  Problem Solving Impairment: Verbal basic;Functional basic  Behaviors: Perseveration  Safety/Judgment: Impaired   Extremity Assessment  (includes Sensation/Coordination)     ADLs  Overall ADL's : Needs assistance/impaired   Grooming: Wash/dry face;Wash/dry hands;Minimal assistance  Upper Body Bathing: Moderate assistance;Sitting  Lower Body Bathing: Total assistance  Upper Body Dressing : Moderate assistance;Sitting  Lower Body Dressing: Total assistance  Toilet Transfer: Maximal assistance;Cueing for safety;Cueing for sequencing;Stand-pivot  Toileting- Clothing Manipulation and Hygiene: Total assistance  Functional mobility during ADLs: Maximal assistance;Cueing for safety;Cueing for sequencing   Mobility  Overal bed mobility: +2 for physical assistance  Bed Mobility: Supine to Sit;Sit to Supine  Supine to sit: Max assist;+2 for physical assistance  Sit to supine: Total assist;+2 for physical assistance  General bed mobility comments: Pt sitting in recliner upon arrival   Transfers  Overall transfer level: Needs assistance  Equipment used: Rolling walker (2 wheeled)  Transfers: Sit to/from Stand  Sit to Stand: Min assist  Stand pivot transfers: Max assist  General transfer comment: cues for hand placement & technique. (A) to achieve standing, balance, & controlled descent.   Ambulation / Gait / Stairs / Wheelchair Mobility  Ambulation/Gait  Ambulation/Gait assistance: Fish farm manager (Feet): 35 Feet  Assistive device: Rolling walker (2 wheeled)  Gait Pattern/deviations: Step-through pattern;Decreased stride length  Gait velocity: decreased  General Gait Details: (A) for balance & to negotiate RW around obstacles.   Posture / Balance    Special needs/care consideration  Bladder mgmt: continent bowel and bladder  Diabetic mgmt Hgb A1C 11.5. Checks her CBGs BID and uses insulin pen. [name] is also a diabetic and I have spoken with him about supervision with her meds. Uses weekly pill box that she manages.  [name] states her PCP is at [facility] and he wants that to be changed to internal medicine clinic which she was at previously here at [facility] about 3 years ago. [place] her PCP does not  manage her well.  Pt is a DNR on acute hospital this admission.   Previous Home Environment  Living Arrangements: Children (son, [name], lives with pt for 4 years)  Lives With: Son  Available Help at Discharge: Family;Available 24 hours/day  Type of Home: Apartment  Home Layout: One level  Home Access: Stairs to enter  Entrance Stairs-Rails: None  Entrance Stairs-Number of Steps: 1  Bathroom Shower/Tub: Tub/shower unit;Other (comment) (with bench in tub; Hand held shower)  Bathroom Toilet: Standard  Bathroom Accessibility: No (pt leaves RW in hall and holds to wall and sink in bathroom)  [place]: No  Additional Comments: Pt takes on showers, cooks and self sufficient. Puts her own pills in pill box weeksly, checks her CBGS twice per day, gives own insulin  via Pen BID. Family does laundry and runs her errands, pt has never driven  Discharge Living Setting  Plans for Discharge Living Setting: Apartment  Type of Home at Discharge: Apartment  Discharge Home Layout: One level  Discharge Home Access: Stairs to enter  Entrance Stairs-Rails: None  Entrance Stairs-Number of Steps: 1  Discharge Bathroom Shower/Tub: Tub/shower unit;Other (comment) (with chair in tub with hand held shower)  Discharge Bathroom Toilet: Standard  Discharge Bathroom Accessibility: No  Does the patient have any problems obtaining your medications?: No  Social/Family/Support Systems  Patient Roles: Parent  Contact Information: see above  Anticipated Caregiver: children and family  Anticipated Caregiver's Contact Information: see above  Ability/Limitations of Caregiver: son unemployed; family in and out all day as pt allows  Caregiver Availability: 24/7  Discharge Plan Discussed with Primary Caregiver: Yes  Is Caregiver In Agreement with Plan?: Yes  Does Caregiver/Family have Issues with Lodging/Transportation while Pt is in Rehab?: No  Goals/Additional Needs  Patient/Family Goal for Rehab: Supervision  with PT, OT, and SLP  Expected length of stay: ELOS 14 to 18 days  Special Service Needs: Son, [name] is also a diabetic and can assist with meds  Pt/Family Agrees to Admission and willing to participate: Yes  Program Orientation Provided & Reviewed with Pt/Caregiver Including Roles & Responsibilities: Yes  Decrease burden of Care through IP rehab admission: n/a  Possible need for SNF placement upon discharge:Family chose [facility] for her rehab recovery for more intense therapy. Does not want SNF as previously planned at [facility].  Patient Condition: This patient's condition remains as documented in the consult dated 02/09/2014, in which the Rehabilitation Physician determined and documented that the patient's condition is appropriate for intensive rehabilitative care in an inpatient rehabilitation facility. Will admit to inpatient rehab today.  Preadmission Screen Completed By: Cleatrice Burke, 02/10/2014 12:50 PM  ______________________________________________________________________  Discussed status with Dr. Naaman Plummer on 02/10/14 at 1250 and received telephone approval for admission today.  Admission Coordinator: Cleatrice Burke, time 1250 Date 02/10/2014.  Cosigned by: Meredith Staggers, MD [02/10/2014 1:32 PM]

## 2014-02-10 NOTE — Progress Notes (Addendum)
I met with pt at bedside and discussed her progress with PT and MD this a.m. I await family arrival this morning to discuss her rehab options for today ie inpt rehab vs SNF. I have left a message for son to contact me. Unable to reach daughter by phone. SW is aware. [phone]  I have met with [name], son at bedside. Pt and Family prefer an inpt rehab stay and I will arrange for today. MD, SW, and RN CM are aware. [phone]

## 2014-02-11 ENCOUNTER — Inpatient Hospital Stay (HOSPITAL_COMMUNITY): Payer: Medicare Other | Admitting: Speech Pathology

## 2014-02-11 ENCOUNTER — Inpatient Hospital Stay (HOSPITAL_COMMUNITY): Payer: Medicare Other | Admitting: *Deleted

## 2014-02-11 ENCOUNTER — Inpatient Hospital Stay (HOSPITAL_COMMUNITY): Payer: Medicare Other | Admitting: Physical Therapy

## 2014-02-11 DIAGNOSIS — I6350 Cerebral infarction due to unspecified occlusion or stenosis of unspecified cerebral artery: Secondary | ICD-10-CM

## 2014-02-11 DIAGNOSIS — I214 Non-ST elevation (NSTEMI) myocardial infarction: Secondary | ICD-10-CM

## 2014-02-11 DIAGNOSIS — I6340 Cerebral infarction due to embolism of unspecified cerebral artery: Secondary | ICD-10-CM

## 2014-02-11 DIAGNOSIS — I428 Other cardiomyopathies: Secondary | ICD-10-CM

## 2014-02-11 DIAGNOSIS — I2510 Atherosclerotic heart disease of native coronary artery without angina pectoris: Secondary | ICD-10-CM

## 2014-02-11 LAB — GLUCOSE, CAPILLARY
GLUCOSE-CAPILLARY: 140 mg/dL — AB (ref 70–99)
GLUCOSE-CAPILLARY: 255 mg/dL — AB (ref 70–99)
GLUCOSE-CAPILLARY: 427 mg/dL — AB (ref 70–99)
Glucose-Capillary: 137 mg/dL — ABNORMAL HIGH (ref 70–99)
Glucose-Capillary: 261 mg/dL — ABNORMAL HIGH (ref 70–99)
Glucose-Capillary: 395 mg/dL — ABNORMAL HIGH (ref 70–99)

## 2014-02-11 LAB — GLUCOSE, RANDOM: GLUCOSE: 440 mg/dL — AB (ref 70–99)

## 2014-02-11 MED ORDER — INSULIN ASPART 100 UNIT/ML ~~LOC~~ SOLN
5.0000 [IU] | Freq: Once | SUBCUTANEOUS | Status: AC
  Administered 2014-02-11: 5 [IU] via SUBCUTANEOUS

## 2014-02-11 NOTE — Evaluation (Addendum)
Speech Language Pathology Assessment and Plan  Patient Details  Name: [name] MRN: [id number] Date of Birth: 25-May-1933  SLP Diagnosis: Aphasia;Dysarthria;Cognitive Impairments;Speech and Language deficits  Rehab Potential: Good ELOS: 10-14 days   Today's Date: 02/11/2014 Time: 1000-1030 Time Calculation (min): 30 min  Problem List:  Patient Active Problem List   Diagnosis Date Noted  . CVA (cerebral infarction) 02/10/2014  . Acute embolic stroke 01/60/1093  . Erosive gastritis with hemorrhage 02/08/2014  . Acute bilat watershed infarction vs. embolic strokes 23/55/7322  . Chronic diastolic CHF EF 02% 54/27/0623  . Non-ST elevation MI (NSTEMI) 02/04/2014  . Renal failure, acute 02/04/2014  . NSTEMI (non-ST elevated myocardial infarction) 02/04/2014  . Nonischemic cardiomyopathy 12/12/2013  . ARF (acute renal failure) 12/05/2013  . Memory difficulties 12/04/2013  . Pulmonary edema 12/03/2013  . Acute diastolic CHF (congestive heart failure) 12/03/2013  . URI (upper respiratory infection) 03/02/2012  . Irregular heart rhythm 02/26/2012  . Ventricular bigeminy 02/26/2012  . GI bleed 08/24/2011  . CAD (coronary artery disease)   . CEREBRAL EMBOLISM, WITH INFARCTION 07/02/2010  . OSTEOPOROSIS 03/21/2009  . DEGENERATIVE JOINT DISEASE 07/24/2008  . INSOMNIA-SLEEP DISORDER-UNSPEC 07/24/2008  . ANEMIA 01/06/2008  . OBSTRUCTIVE SLEEP APNEA 01/06/2008  . GASTROPARESIS, DIABETIC 08/03/2006  . HYPERLIPIDEMIA 08/03/2006  . PERIPHERAL NEUROPATHY 08/03/2006  . HYPERTENSION 08/03/2006  . Chronic diastolic congestive heart failure 08/03/2006  . REACTIVE AIRWAY DISEASE 08/03/2006  . GERD 08/03/2006  . DM (diabetes mellitus), type 2, uncontrolled 11/04/1983   Past Medical History:  Past Medical History  Diagnosis Date  . Asthma   . CAD (coronary artery disease)     Pt reports MI in 2006 (no documentation).  Cardiolite in 05/2002 and 07/2006 did not reveal any reversible  ischemia.  Pt follows with Dr. Rex Kras at [facility].  . CHF (congestive heart failure)     EF 25-30% with dilated LV, mild LVH, severe hypokinesis, and mod-severe reduction in RV function  . Osteoporosis   . HYPERTENSION 08/03/2006  . GASTROPARESIS, DIABETIC 08/03/2006  . HYPERLIPIDEMIA 08/03/2006  . OBSTRUCTIVE SLEEP APNEA 01/06/2008  . PERIPHERAL NEUROPATHY 08/03/2006  . GERD 08/03/2006  . LOW BACK PAIN, CHRONIC 08/03/2006  . OSTEOPOROSIS 03/21/2009  . CEREBRAL EMBOLISM, WITH INFARCTION 07/02/2010  . Angina   . Myocardial infarction "2 or 3"  . Pneumonia 02/26/12    "a few times; probably even today"  . Shortness of breath     "all the time"  . DIABETES MELLITUS, TYPE II 11/04/1983  . Blood transfusion 08/2011  . Lower GI bleeding 08/2011 and 01/2014  . Chronic daily headache   . Migraines   . Stroke summer 2011    "made my left hip worse"  . Uterine cancer   . Nonischemic cardiomyopathy 05/2010    Left heart catheterization:2011. Nonobstructive coronary artery disease.  . Pulmonary hypertension   . Hypertension    Past Surgical History:  Past Surgical History  Procedure Laterality Date  . Esophagogastroduodenoscopy  08/26/2011    Procedure: ESOPHAGOGASTRODUODENOSCOPY (EGD);  Surgeon: [name], MD;  Location: [facility] ENDOSCOPY;  Service: Endoscopy;  Laterality: N/A;  . Colonoscopy  08/28/2011    Procedure: COLONOSCOPY;  Surgeon: [name], MD;  Location: [place];  Service: Endoscopy;  Laterality: N/A;  . Vaginal hysterectomy    . Tubal ligation    . Cataract extraction w/ intraocular lens  implant, bilateral    . Toe surgery      "right big toe; operated on it to straighten  it out; it was under"  . Polysomnogram  10/17/2005    AHI-7.28/hr. AHI REM-20.8/hr. Average oxygen saturation range during REM and NREM was 97%. Lowest oxygen saturation during REM sleep was 90%.  . Carotid duplex  05/28/2010    No significant extracranial carotid artery stenosis demonstrated. Vertebrals  are patent w/ antegrade flow.  . Cardiac catheterization  05/24/2010    No intervention - recommed medical therapy.  . Cardiovascular stress test  08/07/2006    Moderate-severe defect seen in Basal inferior, Mid inferoseptal, Mid inferior, Mid inferolateral, and Apical inferior regions - consistent w/ infarct/scar. No scintigraphic evidence of inducible myocardial ischemia.  . Transthoracic echocardiogram  08/29/2011    EF 55-60%, moderate LVH,   . Esophagogastroduodenoscopy N/A 02/08/2014    Procedure: ESOPHAGOGASTRODUODENOSCOPY (EGD);  Surgeon: [name], MD;  Location: [facility] ENDOSCOPY;  Service: Endoscopy;  Laterality: N/A;    Assessment / Plan / Recommendation Clinical Impression Patient presents with expressive and receptive aphasia which is exacerbated by current state of extreme lethargy. Patient required maximal verbal and tactile cues to initiate and maintain attention. Patient able to state correct month when asked when her birthday was, and correctly identified granddaughter by name when presented with photo. Patient's daughter present during speech-language evaluation and she stated that prior today, patient had 4 days of very poor sleep, which she attributed to patient's current  level of  poor alertness. Patient exhibited perseverations on responses,deficitis in naming, orientation, awarenes and basic level problem solving. Her deficits significantly impact her safety, and ability to communicate and  have her basic wants/needs met Patient is not expected to improve without intervention from a skilled speech-language pathologist..  Skilled Therapeutic Interventions            SLP Assessment  Patient will need skilled [place] [place] [facility] during CIR admission    Recommendations  Diet Recommendations: Dysphagia 3 (Mechanical Soft);Thin liquid Liquid Administration via: Cup Medication Administration: Whole meds with puree Patient destination: Home (d/c pending family  ability to assist, home with 24 hour vs. SNF) Follow up Recommendations: 24 hour supervision/assistance;Skilled Nursing facility    SLP Frequency 5 out of 7 days   SLP Treatment/Interventions Cognitive remediation/compensation;Cueing hierarchy;Environmental controls;Functional tasks;Dysphagia/aspiration precaution training;Internal/external aids;Multimodal communication approach;Patient/family education;Speech/Language facilitation;Therapeutic Exercise    Pain PAINAD (Pain Assessment in Advanced Dementia) Breathing: normal Negative Vocalization: none Facial Expression: smiling or inexpressive Body Language: relaxed Consolability: no need to console PAINAD Score: 0 Prior Functioning Cognitive/Linguistic Baseline: Baseline deficits Baseline deficit details: early dementia Type of Home: Apartment  Lives With: Son Available Help at Discharge: Family;Available 24 hours/day Vocation: Retired  Industrial/product designer Term Goals: Week 1: SLP Short Term Goal 1 (Week 1): Patient will initiate response to biographical and immediate needs questions with minimal cues. SLP Short Term Goal 2 (Week 1): Patient will follow 1-step/basic level directions to complete functional tasks wtih 75% accuracy.  SLP Short Term Goal 3 (Week 1): Patient will sustain attention to task and during conversation for 2-3 minutes with moderate cues.  See FIM for current functional status Refer to Care Plan for Long Term Goals  Recommendations for other services: None  Discharge Criteria: Patient will be discharged from SLP if patient refuses treatment 3 consecutive times without medical reason, if treatment goals not met, if there is a change in medical status, if patient makes no progress towards goals or if patient is discharged from hospital.  The above assessment, treatment plan, treatment alternatives and goals were discussed and mutually agreed upon: by  family  Dannial Monarch 02/11/2014, 8:58 PM  Sonia Baller, MA,  CCC-SLP [facility] Speech-Language Pathologist

## 2014-02-11 NOTE — Evaluation (Signed)
Physical Therapy Assessment and Plan  Patient Details  Name: [name] MRN: [id number] Date of Birth: July 19, 1933  PT Diagnosis: Abnormal posture, Abnormality of gait, Impaired cognition, Impaired sensation and Muscle weakness Rehab Potential: Fair ELOS: 10 to 14 days   Today's Date: 02/11/2014 Time: 0086-7619 Time Calculation (min): 53 min  Problem List:  Patient Active Problem List   Diagnosis Date Noted  . CVA (cerebral infarction) 02/10/2014  . Acute embolic stroke 50/93/2671  . Erosive gastritis with hemorrhage 02/08/2014  . Acute bilat watershed infarction vs. embolic strokes 24/58/0998  . Chronic diastolic CHF EF 33% 82/50/5397  . Non-ST elevation MI (NSTEMI) 02/04/2014  . Renal failure, acute 02/04/2014  . NSTEMI (non-ST elevated myocardial infarction) 02/04/2014  . Nonischemic cardiomyopathy 12/12/2013  . ARF (acute renal failure) 12/05/2013  . Memory difficulties 12/04/2013  . Pulmonary edema 12/03/2013  . Acute diastolic CHF (congestive heart failure) 12/03/2013  . URI (upper respiratory infection) 03/02/2012  . Irregular heart rhythm 02/26/2012  . Ventricular bigeminy 02/26/2012  . GI bleed 08/24/2011  . CAD (coronary artery disease)   . CEREBRAL EMBOLISM, WITH INFARCTION 07/02/2010  . OSTEOPOROSIS 03/21/2009  . DEGENERATIVE JOINT DISEASE 07/24/2008  . INSOMNIA-SLEEP DISORDER-UNSPEC 07/24/2008  . ANEMIA 01/06/2008  . OBSTRUCTIVE SLEEP APNEA 01/06/2008  . GASTROPARESIS, DIABETIC 08/03/2006  . HYPERLIPIDEMIA 08/03/2006  . PERIPHERAL NEUROPATHY 08/03/2006  . HYPERTENSION 08/03/2006  . Chronic diastolic congestive heart failure 08/03/2006  . REACTIVE AIRWAY DISEASE 08/03/2006  . GERD 08/03/2006  . DM (diabetes mellitus), type 2, uncontrolled 11/04/1983    Past Medical History:  Past Medical History  Diagnosis Date  . Asthma   . CAD (coronary artery disease)     Pt reports MI in 2006 (no documentation).  Cardiolite in 05/2002 and 07/2006 did not reveal  any reversible ischemia.  Pt follows with Dr. Rex Kras at [facility].  . CHF (congestive heart failure)     EF 25-30% with dilated LV, mild LVH, severe hypokinesis, and mod-severe reduction in RV function  . Osteoporosis   . HYPERTENSION 08/03/2006  . GASTROPARESIS, DIABETIC 08/03/2006  . HYPERLIPIDEMIA 08/03/2006  . OBSTRUCTIVE SLEEP APNEA 01/06/2008  . PERIPHERAL NEUROPATHY 08/03/2006  . GERD 08/03/2006  . LOW BACK PAIN, CHRONIC 08/03/2006  . OSTEOPOROSIS 03/21/2009  . CEREBRAL EMBOLISM, WITH INFARCTION 07/02/2010  . Angina   . Myocardial infarction "2 or 3"  . Pneumonia 02/26/12    "a few times; probably even today"  . Shortness of breath     "all the time"  . DIABETES MELLITUS, TYPE II 11/04/1983  . Blood transfusion 08/2011  . Lower GI bleeding 08/2011 and 01/2014  . Chronic daily headache   . Migraines   . Stroke summer 2011    "made my left hip worse"  . Uterine cancer   . Nonischemic cardiomyopathy 05/2010    Left heart catheterization:2011. Nonobstructive coronary artery disease.  . Pulmonary hypertension   . Hypertension    Past Surgical History:  Past Surgical History  Procedure Laterality Date  . Esophagogastroduodenoscopy  08/26/2011    Procedure: ESOPHAGOGASTRODUODENOSCOPY (EGD);  Surgeon: [name], MD;  Location: [facility] ENDOSCOPY;  Service: Endoscopy;  Laterality: N/A;  . Colonoscopy  08/28/2011    Procedure: COLONOSCOPY;  Surgeon: [name], MD;  Location: [place];  Service: Endoscopy;  Laterality: N/A;  . Vaginal hysterectomy    . Tubal ligation    . Cataract extraction w/ intraocular lens  implant, bilateral    . Toe surgery      "  right big toe; operated on it to straighten it out; it was under"  . Polysomnogram  10/17/2005    AHI-7.28/hr. AHI REM-20.8/hr. Average oxygen saturation range during REM and NREM was 97%. Lowest oxygen saturation during REM sleep was 90%.  . Carotid duplex  05/28/2010    No significant extracranial carotid artery stenosis  demonstrated. Vertebrals are patent w/ antegrade flow.  . Cardiac catheterization  05/24/2010    No intervention - recommed medical therapy.  . Cardiovascular stress test  08/07/2006    Moderate-severe defect seen in Basal inferior, Mid inferoseptal, Mid inferior, Mid inferolateral, and Apical inferior regions - consistent w/ infarct/scar. No scintigraphic evidence of inducible myocardial ischemia.  . Transthoracic echocardiogram  08/29/2011    EF 55-60%, moderate LVH,   . Esophagogastroduodenoscopy N/A 02/08/2014    Procedure: ESOPHAGOGASTRODUODENOSCOPY (EGD);  Surgeon: [name], MD;  Location: [facility] ENDOSCOPY;  Service: Endoscopy;  Laterality: N/A;    Assessment & Plan Clinical Impression: Patient is a 80 y.o. female with h/o CAD, DM type 2 with gastroparesis, CHF exacerbation 11/2103, GIB, early dementia who has had recent increase in falls with melena. She was admitted on 04/25 /15 with expressive aphasia, lethargy as well as twisted lip. She was found to be in acute renal failure and anemic with hgb-5.6. She was treated with IVF and transfused with 2 unit PRBC. Patient with elevated cardiac enzyme that was felt to be multifactorial due to demand ischemia. 2 D echo with EF 40-45%. Dr. Jonnie Finner consulted for input on acute on chronic renal failure and recommended d/c ace as well as hydration as patient poor HD candidate. Patient continued to be confused with fluctuating mentation and MRI brain done revealing multiple bilateral watershed infarcts. Carotid dopplers without ICA stenosis. Neurology recommended ASA if GIB resolved. Dr. Carlean Purl consulted for input and EGD done on 02/08/14 Revealing erosive gastritis in gastric antrum. She's cleared to use low dose ASA in two weeks if needed. Neurology feels that patient with embolic infarcts due to A Fib and Eliquis 2.5 mg bid recommended if CrCL >30. Patient with poor renal recovery and nephrology recommended palliative care to discuss [place]. EEG with  evidence of mild encephalopathy. Today with improvement in mentation. Patient with subsequent right neglect, moderate dysarthria, problems with initiation, attention as well as aphasia    Patient transferred to CIR on 02/10/2014 .   Patient currently requires mod with mobility secondary to muscle weakness, decreased cardiorespiratoy endurance and ataxia and decreased coordination.  Prior to hospitalization, patient was modified independent  with mobility and lived with Son in a [place] home.  Home access is 1Stairs to enter.  Patient will benefit from skilled PT intervention to maximize safe functional mobility, minimize fall risk and decrease caregiver burden for planned discharge home with 24 hour assist.  Anticipate patient will benefit from follow up [facility] at discharge.  PT - End of Session Activity Tolerance: Tolerates 10 - 20 min activity with multiple rests Endurance Deficit: Yes Endurance Deficit Description: lethargic, but willing to participate as able, quickly fatigued with activity, decreased sleep as per family PT Assessment Rehab Potential: Fair Barriers to Discharge: Inaccessible home environment PT Patient demonstrates impairments in the following area(s): Balance;Endurance;Motor;Perception;Safety PT Transfers Functional Problem(s): Bed Mobility;Bed to Chair;Car PT Locomotion Functional Problem(s): Ambulation;Wheelchair Mobility;Stairs PT Plan PT Intensity: Minimum of 1-2 x/day ,45 to 90 minutes PT Frequency: 5 out of 7 days PT Duration Estimated Length of Stay: 10 to 14 days PT Treatment/Interventions: Ambulation/gait training;Balance/vestibular training;Discharge planning;DME/adaptive equipment instruction;Functional  mobility training;Neuromuscular re-education;UE/LE Coordination activities;UE/LE Strength taining/ROM;Splinting/orthotics;Therapeutic Exercise;Therapeutic Activities;Patient/family education;Stair training;Wheelchair propulsion/positioning PT Transfers Anticipated  Outcome(s): S for transfers  PT Locomotion Anticipated Outcome(s): S for ambulation, min A for stairs, S for w/c mobility PT Recommendation Follow Up Recommendations: Home health PT Patient destination: Home Equipment Recommended: Rolling walker with 5" wheels  Skilled Therapeutic Intervention PT evaluation completed and treatment plan initiated. Frequent verbal cues required throughout evaluation to maintain level of arousal. Pt ambulated with rolling walker and min A about 10 feet. Pt returned to room and left sitting up in w/c with quick release belt in place.   PT Evaluation Precautions/Restrictions Precautions Precautions: Fall Precaution Comments: right field cut Restrictions Weight Bearing Restrictions: No General Chart Reviewed: Yes Family/Caregiver Present: Yes Pain No c/o pain.    Home Living/Prior Functioning Home Living Available Help at Discharge: Family;Available 24 hours/day Type of Home: Apartment Home Access: Stairs to enter Entrance Stairs-Number of Steps: 1 Entrance Stairs-Rails: None Home Layout: One level  Lives With: Son Prior Function Level of Independence: Needs assistance with gait;Needs assistance with tranfers Comments: as per daughter at bedside, pt was transfering and ambulating with rolling walker and S, however has had several falls recently. As per son pt was mod I prior to admission.  Vision/Perception  Vision - Assessment Additional Comments: right field cut  Cognition Overall Cognitive Status: Impaired/Different from baseline Arousal/Alertness: Lethargic Orientation Level: Oriented to person Attention: Focused;Sustained Focused Attention: Impaired Sustained Attention: Impaired Memory: Impaired Memory Impairment: Decreased recall of new information;Decreased short term memory Awareness: Impaired Problem Solving: Impaired Sequencing: Impaired Organizing: Impaired Decision Making: Impaired Safety/Judgment: Impaired Comments: pt  lethargic but willing to participate, difficult to formally assess, expressive aphasia, difficutly with word finding, decreased attention span, able to follow simple commands, R inattention Sensation Sensation Light Touch: Impaired by gross assessment Additional Comments: B LEs by gross assessment Motor  Motor Motor: Abnormal postural alignment and control;Ataxia  Mobility Bed Mobility Bed Mobility: Supine to Sit;Sit to Supine Supine to Sit: HOB flat;3: Mod assist (no rails) Sit to Supine: 3: Mod assist;HOB flat;Other (comment) (no rails) Transfers Transfers: Yes Sit to Stand: 4: Min assist;3: Mod assist Stand Pivot Transfers: 3: Mod assist Locomotion  Ambulation Ambulation: Yes Ambulation/Gait Assistance: 3: Mod assist Ambulation Distance (Feet): 10 Feet Assistive device: 1 person hand held assist Stairs / Additional Locomotion Stairs: Yes Stairs Assistance: 3: Mod assist Stair Management Technique: Two rails Number of Stairs: 1 Wheelchair Mobility Wheelchair Mobility: Yes Wheelchair Assistance: 1: +1 Total assist  Trunk/Postural Assessment  Cervical Assessment Cervical Assessment: Within Functional Limits Thoracic Assessment Thoracic Assessment: Within Functional Limits Lumbar Assessment Lumbar Assessment: Within Functional Limits Postural Control Postural Control: Deficits on evaluation  Balance Balance Balance Assessed: Yes Static Sitting Balance Static Sitting - Balance Support: Feet supported Static Sitting - Level of Assistance: 5: Stand by assistance Static Standing Balance Static Standing - Balance Support: During functional activity Static Standing - Level of Assistance: 4: Min assist Dynamic Standing Balance Dynamic Standing - Balance Support: During functional activity Dynamic Standing - Level of Assistance: 3: Mod assist Extremity Assessment B UEs as per OT evaluation.   RLE Assessment RLE Assessment: Exceptions to [facility] RLE PROM (degrees) Overall  PROM Right Lower Extremity: Within functional limits for tasks assessed RLE Strength RLE Overall Strength: Deficits RLE Overall Strength Comments: grossly 2/5 to 2+/5 throughout LLE Assessment LLE Assessment: Exceptions to [facility] LLE AROM (degrees) Overall AROM Left Lower Extremity: Deficits LLE Strength LLE Overall Strength: Deficits LLE Overall Strength Comments: grossly  3-/5 throughout  FIM:  FIM - Bed/Chair Transfer Bed/Chair Transfer Assistive Devices: Arm rests Bed/Chair Transfer: 3: Sit > Supine: Mod A (lifting assist/Pt. 50-74%/lift 2 legs);3: Supine > Sit: Mod A (lifting assist/Pt. 50-74%/lift 2 legs;3: Bed > Chair or W/C: Mod A (lift or lower assist);3: Chair or W/C > Bed: Mod A (lift or lower assist) FIM - Locomotion: Wheelchair Locomotion: Wheelchair: 1: Total Assistance/staff pushes wheelchair (Pt<25%) FIM - Locomotion: Ambulation Locomotion: Ambulation Assistive Devices: Other (comment) (hand hold assist) Ambulation/Gait Assistance: 3: Mod assist Locomotion: Ambulation: 1: Travels less than 50 ft with moderate assistance (Pt: 50 - 74%) FIM - Locomotion: Stairs Locomotion: Scientist, physiological: Hand rail - 2 Locomotion: Stairs: 1: Up and Down < 4 stairs with moderate assistance (Pt: 50 - 74%)   Refer to Care Plan for Long Term Goals  Recommendations for other services: None  Discharge Criteria: Patient will be discharged from PT if patient refuses treatment 3 consecutive times without medical reason, if treatment goals not met, if there is a change in medical status, if patient makes no progress towards goals or if patient is discharged from hospital.  The above assessment, treatment plan, treatment alternatives and goals were discussed and mutually agreed upon: by patient  [name] 02/11/2014, 1:26 PM

## 2014-02-11 NOTE — Progress Notes (Signed)
80 y.o. female with h/o CAD, DM type 2 with gastroparesis, CHF exacerbation 11/2103, GIB, early dementia who has had recent increase in falls with melena. She was admitted on 04/25 /15 with expressive aphasia, lethargy as well as twisted lip. She was found to be in acute renal failure and anemic with hgb-5.6. She was treated with IVF and transfused with 2 unit PRBC. Patient with elevated cardiac enzyme that was felt to be multifactorial due to demand ischemia. 2 D echo with EF 40-45%. Dr. Jonnie Finner consulted for input on acute on chronic renal failure and recommended d/c ace as well as hydration as patient poor HD candidate. Patient continued to be confused with fluctuating mentation and MRI brain done revealing multiple bilateral watershed infarcts. Carotid dopplers without ICA stenosis. Neurology recommended ASA if GIB resolved. Dr. Carlean Purl consulted for input and EGD done on 02/08/14 Revealing erosive gastritis in gastric antrum. She's cleared to use low dose ASA in two weeks if needed. Neurology feels that patient with embolic infarcts due to A Fib and Eliquis 2.5 mg bid recommended if CrCL >30.   Subjective/Complaints: Abd pain this am no N/V  Review of Systems - cannot obtain secondary to mental status  Objective: Vital Signs: Blood pressure 162/70, pulse 56, temperature 98.8 F (37.1 C), temperature source Oral, resp. rate 16, height 5\' 2"  (1.575 m), weight 80.6 kg (177 lb 11.1 oz), last menstrual period 12/19/1968, SpO2 98.00%. No results found. Results for orders placed during the hospital encounter of 02/10/14 (from the past 72 hour(s))  GLUCOSE, CAPILLARY     Status: Abnormal   Collection Time    02/10/14  4:24 PM      Result Value Ref Range   Glucose-Capillary 280 (*) 70 - 99 mg/dL  GLUCOSE, CAPILLARY     Status: Abnormal   Collection Time    02/10/14  8:12 PM      Result Value Ref Range   Glucose-Capillary 320 (*) 70 - 99 mg/dL  GLUCOSE, CAPILLARY     Status: Abnormal   Collection  Time    02/11/14 12:02 AM      Result Value Ref Range   Glucose-Capillary 255 (*) 70 - 99 mg/dL  GLUCOSE, CAPILLARY     Status: Abnormal   Collection Time    02/11/14  4:03 AM      Result Value Ref Range   Glucose-Capillary 140 (*) 70 - 99 mg/dL  GLUCOSE, CAPILLARY     Status: Abnormal   Collection Time    02/11/14  7:41 AM      Result Value Ref Range   Glucose-Capillary 137 (*) 70 - 99 mg/dL   Comment 1 Notify RN        Nursing note and vitals reviewed.  Constitutional: She appears well-developed and well-nourished.  HENT:  Head: Normocephalic and atraumatic.  Eyes: Conjunctivae are normal. Pupils are equal, round, and reactive to light.  Neck: Normal range of motion. Neck supple.  Cardiovascular: Normal rate and regular rhythm.  No murmur heard.  Respiratory: Effort normal and breath sounds normal. No respiratory distress. She has no wheezes.  GI: Soft. Bowel sounds are normal. She exhibits no distension.  Musculoskeletal: She exhibits no tenderness.  Neurological: She is alert.  Oriented to self and place. Was able to ID family members and state DOB but not age. Unable to follow two step commands. Impulsive and needs occasional redirection. RUE inattention.  Skin: Skin is warm and dry.  needs cues for limited orientation  Motor strength  4/5 in bilateral delt bi tri grip HF KE ADF  Sensory is difficult to assess secondary to cognition but does correctly identify light touch on both feet  No ataxia noted FNF   Assessment/Plan: 1. Functional deficits secondary to bilateral cerebral watershed infarcts which require 3+ hours per day of interdisciplinary therapy in a comprehensive inpatient rehab setting. Physiatrist is providing close team supervision and 24 hour management of active medical problems listed below. Physiatrist and rehab team continue to assess barriers to discharge/monitor patient progress toward functional and medical goals. FIM: FIM - Bathing Bathing: 0:  Activity did not occur  FIM - Upper Body Dressing/Undressing Upper body dressing/undressing: 0: Activity did not occur FIM - Lower Body Dressing/Undressing Lower body dressing/undressing: 0: Activity did not occur  FIM - Toileting Toileting steps completed by patient: Performs perineal hygiene Toileting Assistive Devices: Grab bar or rail for support Toileting: 2: Max-Patient completed 1 of 3 steps  FIM - Radio producer Devices: Grab bars Toilet Transfers: 4-To toilet/BSC: Min A (steadying Pt. > 75%);4-From toilet/BSC: Min A (steadying Pt. > 75%)  FIM - Bed/Chair Transfer Bed/Chair Transfer Assistive Devices: Bed rails Bed/Chair Transfer: 4: Supine > Sit: Min A (steadying Pt. > 75%/lift 1 leg);3: Sit > Supine: Mod A (lifting assist/Pt. 50-74%/lift 2 legs);4: Bed > Chair or W/C: Min A (steadying Pt. > 75%);3: Chair or W/C > Bed: Mod A (lift or lower assist)     Comprehension Comprehension Mode: Auditory Comprehension: 2-Understands basic 25 - 49% of the time/requires cueing 51 - 75% of the time  Expression Expression Mode: Verbal Expression: 2-Expresses basic 25 - 49% of the time/requires cueing 50 - 75% of the time. Uses single words/gestures.  Social Interaction Social Interaction: 2-Interacts appropriately 25 - 49% of time - Needs frequent redirection.  Problem Solving Problem Solving: 2-Solves basic 25 - 49% of the time - needs direction more than half the time to initiate, plan or complete simple activities  Memory Memory: 2-Recognizes or recalls 25 - 49% of the time/requires cueing 51 - 75% of the time  Medical Problem List and Plan:  1. Functional deficits secondary to Bilateral cerebral watershed infarcts related to acute anemia from GIB  2. DVT Prophylaxis/Anticoagulation: Mechanical: Sequential compression devices, below knee Bilateral lower extremities  3. Pain Management: Tylenol prn for pain.  4. Mood: No signs of distress. LCSW to  follow with patient and family for evaluation and support.  5. Neuropsych: This patient is not capable of making decisions on her own behalf.  6. GIB due to erosive gatritis: Increase PPI to bid as at home.  7. HTN: Will monitor with tid checks. Continue metoprolol and Imdur.  8. Acute on CRF: Baseline Cr 1.2-1.7. Slowly improving with BUN/Cr- 62/4.42  9. ABLA: Will continue to monitor H/H 3 x week. Is slowly improving. Will add iron supplement due to h/o iron deficiency.  10 DM type 2: Will resume Lantus insulin at conservative dose of 5 units/HS. Monitor BS with ac/hs checks and titrate medications as indicated. Use SSI for elevated BS.  11. Chronic diastolic CHF: Check daily weights and monitor for signs of overload. Low salt diet.  12 NSTEMI due to demand ischemia: treated medically with Imdur, metoprolol and Lipitor. No ASA due to GIB.  13. Mild dementia with sundowning (was functional PTA): Nursing on acute reports insomnia with restlessness/agitation at night but not documented in chart. Will monitor for trends more closely while on inpatient rehab.   LOS (Days) 1 A  FACE TO FACE EVALUATION WAS PERFORMED  Charlett Blake 02/11/2014, 9:12 AM

## 2014-02-11 NOTE — Progress Notes (Signed)
Occupational Therapy Session Note  Patient Details  Name: [name] MRN: [id number] Date of Birth: Mar 05, 1933  Today's Date: 02/11/2014 Time: 2706-2376 Time Calculation (min): 70 min   1st session  Short Term Goals: Week 1:  OT Short Term Goal 1 (Week 1): Pt. will compensated for right field cut by looking to right with 1 verba prompt OT Short Term Goal 2 (Week 1): Pt. will dress self with minimal assist OT Short Term Goal 3 (Week 1): Pt. will bathe self with minimal assist OT Short Term Goal 4 (Week 1): Pt. will transfer to toilet with SBA OT Short Term Goal 5 (Week 1): Pt. will transfer to bathtub with shower with minimal assist  Skilled Therapeutic Interventions/Progress Updates:    1st session:  Addressed transfers, sit to stand , standing balance, attention attending to the right.  Pt. Lying in bed asleep.  Daughter, Rodena Piety present.  Pt. Aroused.  Went from supine to to sit with SBA.  Sat EOB.  Pt transferred to wc with minimal assist.  Transferred to toilet with minimal assist and increased time.  Pt had urine leakage when getting up from bed.  Urine flowed out the front of toilet during toileting.  Pt did not express awareness of this.   Ambulated from toilet to shower with minimal assist using grab bar and HHA.  Pt. Went from sit to stand in shower with minimal assist.  Dressed in wc with minimal assistance with spatial orientation of clothes especially sports bra.  Pt set up breakfast tray with assist with opening packages.  Pt. Shows decreased intellectual awareness during session.  Not oriented to day, but knew name.  Needed cues to eat slowly and swallow before taking another bite of food.   Therapy Documentation Precautions:  Precautions Precautions: Fall Precaution Comments: right field cut or right inattention Restrictions Weight Bearing Restrictions: No      2nd session:  Time:1320-1400  (40 min) Pain:  none Individual session  2nd session:  Skilled therapeutic  intervention for functional mobility, transfers, pt family education.  Three sisters present during session and another daughter from out of town.   1 daughter ([name])came near end.  Addressed OT goals, POC and discharge planning.  Family wanting assistance with pt care when she goes home.  OT verbalized goals of pt being at supervision level and would need Home Health after discharge.  Asked family to address with SW on Monday.  Ambulated with pt to bathroom with RW with minimal assist.  Pt. Had urine and BM.  Needed mod cues for cleaning and urinating inside the toilet. Pt. Ambulated to sink with MOD assist and increased fatique.  She washed hands with minimal assistance and returned to wc with family present.    Vital Signs: Oxygen Therapy SpO2: 98 % O2 Device: None (Room air) Pain:  none   ADL: See FIM for current functional status  Therapy/Group: Individual Therapy  Lisa Roca 02/11/2014, 9:16 AM

## 2014-02-11 NOTE — Evaluation (Signed)
Speech Language Pathology-Bedside Swallow Assessment  Patient Details  Name: [name] MRN: [id number] Date of Birth: [birth year]  SLP Diagnosis: Aphasia;Dysarthria;Cognitive Impairments;Speech and Language deficits;Dysphagia  Rehab Potential: Good ELOS: 10-14 days   Today's Date: 02/11/2014 Time: 1200-1230 Time Calculation (min): 30 min  Problem List:  Patient Active Problem List   Diagnosis Date Noted  . CVA (cerebral infarction) 02/10/2014  . Acute embolic stroke 26/94/8546  . Erosive gastritis with hemorrhage 02/08/2014  . Acute bilat watershed infarction vs. embolic strokes 27/12/5007  . Chronic diastolic CHF EF 38% 18/29/9371  . Non-ST elevation MI (NSTEMI) 02/04/2014  . Renal failure, acute 02/04/2014  . NSTEMI (non-ST elevated myocardial infarction) 02/04/2014  . Nonischemic cardiomyopathy 12/12/2013  . ARF (acute renal failure) 12/05/2013  . Memory difficulties 12/04/2013  . Pulmonary edema 12/03/2013  . Acute diastolic CHF (congestive heart failure) 12/03/2013  . URI (upper respiratory infection) 03/02/2012  . Irregular heart rhythm 02/26/2012  . Ventricular bigeminy 02/26/2012  . GI bleed 08/24/2011  . CAD (coronary artery disease)   . CEREBRAL EMBOLISM, WITH INFARCTION 07/02/2010  . OSTEOPOROSIS 03/21/2009  . DEGENERATIVE JOINT DISEASE 07/24/2008  . INSOMNIA-SLEEP DISORDER-UNSPEC 07/24/2008  . ANEMIA 01/06/2008  . OBSTRUCTIVE SLEEP APNEA 01/06/2008  . GASTROPARESIS, DIABETIC 08/03/2006  . HYPERLIPIDEMIA 08/03/2006  . PERIPHERAL NEUROPATHY 08/03/2006  . HYPERTENSION 08/03/2006  . Chronic diastolic congestive heart failure 08/03/2006  . REACTIVE AIRWAY DISEASE 08/03/2006  . GERD 08/03/2006  . DM (diabetes mellitus), type 2, uncontrolled 11/04/1983   Past Medical History:  Past Medical History  Diagnosis Date  . Asthma   . CAD (coronary artery disease)     Pt reports MI in 2006 (no documentation).  Cardiolite in 05/2002 and 07/2006 did not reveal any  reversible ischemia.  Pt follows with Dr. Rex Kras at [facility].  . CHF (congestive heart failure)     EF 25-30% with dilated LV, mild LVH, severe hypokinesis, and mod-severe reduction in RV function  . Osteoporosis   . HYPERTENSION 08/03/2006  . GASTROPARESIS, DIABETIC 08/03/2006  . HYPERLIPIDEMIA 08/03/2006  . OBSTRUCTIVE SLEEP APNEA 01/06/2008  . PERIPHERAL NEUROPATHY 08/03/2006  . GERD 08/03/2006  . LOW BACK PAIN, CHRONIC 08/03/2006  . OSTEOPOROSIS 03/21/2009  . CEREBRAL EMBOLISM, WITH INFARCTION 07/02/2010  . Angina   . Myocardial infarction "2 or 3"  . Pneumonia 02/26/12    "a few times; probably even today"  . Shortness of breath     "all the time"  . DIABETES MELLITUS, TYPE II 11/04/1983  . Blood transfusion 08/2011  . Lower GI bleeding 08/2011 and 01/2014  . Chronic daily headache   . Migraines   . Stroke summer 2011    "made my left hip worse"  . Uterine cancer   . Nonischemic cardiomyopathy 05/2010    Left heart catheterization:2011. Nonobstructive coronary artery disease.  . Pulmonary hypertension   . Hypertension    Past Surgical History:  Past Surgical History  Procedure Laterality Date  . Esophagogastroduodenoscopy  08/26/2011    Procedure: ESOPHAGOGASTRODUODENOSCOPY (EGD);  Surgeon: [name], MD;  Location: [facility] ENDOSCOPY;  Service: Endoscopy;  Laterality: N/A;  . Colonoscopy  08/28/2011    Procedure: COLONOSCOPY;  Surgeon: [name], MD;  Location: [facility];  Service: Endoscopy;  Laterality: N/A;  . Vaginal hysterectomy    . Tubal ligation    . Cataract extraction w/ intraocular lens  implant, bilateral    . Toe surgery      "right big toe; operated on it to straighten it  out; it was under"  . Polysomnogram  10/17/2005    AHI-7.28/hr. AHI REM-20.8/hr. Average oxygen saturation range during REM and NREM was 97%. Lowest oxygen saturation during REM sleep was 90%.  . Carotid duplex  05/28/2010    No significant extracranial carotid artery stenosis demonstrated.  Vertebrals are patent w/ antegrade flow.  . Cardiac catheterization  05/24/2010    No intervention - recommed medical therapy.  . Cardiovascular stress test  08/07/2006    Moderate-severe defect seen in Basal inferior, Mid inferoseptal, Mid inferior, Mid inferolateral, and Apical inferior regions - consistent w/ infarct/scar. No scintigraphic evidence of inducible myocardial ischemia.  . Transthoracic echocardiogram  08/29/2011    EF 55-60%, moderate LVH,   . Esophagogastroduodenoscopy N/A 02/08/2014    Procedure: ESOPHAGOGASTRODUODENOSCOPY (EGD);  Surgeon: [name], MD;  Location: [facility] ENDOSCOPY;  Service: Endoscopy;  Laterality: N/A;    Assessment / Plan / Recommendation Clinical Impression Patient presents with a primary oral dysphagia , with a secondary mild pharyngeal dysphagia. Dysphagia symptoms consist of poor mastication, difficulty with oral manipulation of solid texture boluses with suspected delayed swallow initiation of solid texture boluses. Patient is at a mild risk of aspiration secondary to dysphagia .  Skilled Therapeutic Interventions            SLP Assessment  Patient will need skilled Speech Lanaguage Pathology Services during CIR admission    Recommendations  Diet Recommendations: Dysphagia 3 (Mechanical Soft);Thin liquid Liquid Administration via: Cup Medication Administration: Whole meds with puree Supervision: Staff to assist with self feeding;Full supervision/cueing for compensatory strategies Compensations: Slow rate;Small sips/bites Oral Care Recommendations: Oral care BID Patient destination: [place] (SNF) Follow up Recommendations: 24 hour supervision/assistance;Skilled Nursing facility    SLP Frequency 5 out of 7 days   SLP Treatment/Interventions Cognitive remediation/compensation;Cueing hierarchy;Environmental controls;Functional tasks;Dysphagia/aspiration precaution training;Internal/external aids;Multimodal communication  approach;Patient/family education;Speech/Language facilitation;Therapeutic Exercise    Pain PAINAD (Pain Assessment in Advanced Dementia) Breathing: normal Negative Vocalization: none Facial Expression: smiling or inexpressive Body Language: relaxed Consolability: no need to console PAINAD Score: 0 Prior Functioning Cognitive/Linguistic Baseline: Baseline deficits Baseline deficit details: early dementia Type of Home: Apartment  Lives With: Son Available Help at Discharge: Family;Available 24 hours/day Vocation: Retired  Industrial/product designer Term Goals: Week 1: SLP Short Term Goal 1 (Week 1): Patient will initiate response to biographical and immediate needs questions with minimal cues. SLP Short Term Goal 2 (Week 1): Patient will follow 1-step/basic level directions to complete functional tasks wtih 75% accuracy.  SLP Short Term Goal 3 (Week 1): Patient will sustain attention to task and during conversation for 2-3 minutes with moderate cues. SLP Short Term Goal 4 (Week 1): patient will consume least restrictive diet with minimal assistance and no ovet s/s aspiration  See FIM for current functional status Refer to Care Plan for Long Term Goals  Recommendations for other services: None  Discharge Criteria: Patient will be discharged from SLP if patient refuses treatment 3 consecutive times without medical reason, if treatment goals not met, if there is a change in medical status, if patient makes no progress towards goals or if patient is discharged from hospital.  The above assessment, treatment plan, treatment alternatives and goals were discussed and mutually agreed upon: by family  Dannial Monarch 02/11/2014, 9:08 PM  Sonia Baller, MA, CCC-SLP [facility] Speech-Language Pathologist

## 2014-02-12 ENCOUNTER — Inpatient Hospital Stay (HOSPITAL_COMMUNITY): Payer: Medicare Other | Admitting: Physical Therapy

## 2014-02-12 LAB — GLUCOSE, CAPILLARY
GLUCOSE-CAPILLARY: 163 mg/dL — AB (ref 70–99)
Glucose-Capillary: 155 mg/dL — ABNORMAL HIGH (ref 70–99)
Glucose-Capillary: 188 mg/dL — ABNORMAL HIGH (ref 70–99)
Glucose-Capillary: 202 mg/dL — ABNORMAL HIGH (ref 70–99)
Glucose-Capillary: 226 mg/dL — ABNORMAL HIGH (ref 70–99)
Glucose-Capillary: 331 mg/dL — ABNORMAL HIGH (ref 70–99)
Glucose-Capillary: 367 mg/dL — ABNORMAL HIGH (ref 70–99)

## 2014-02-12 MED ORDER — INSULIN GLARGINE 100 UNIT/ML ~~LOC~~ SOLN
10.0000 [IU] | Freq: Every day | SUBCUTANEOUS | Status: DC
  Administered 2014-02-12 – 2014-02-19 (×8): 10 [IU] via SUBCUTANEOUS
  Filled 2014-02-12 (×9): qty 0.1

## 2014-02-12 NOTE — Progress Notes (Signed)
80 y.o. female with h/o CAD, DM type 2 with gastroparesis, CHF exacerbation 11/2103, GIB, early dementia who has had recent increase in falls with melena. She was admitted on 04/25 /15 with expressive aphasia, lethargy as well as twisted lip. She was found to be in acute renal failure and anemic with hgb-5.6. She was treated with IVF and transfused with 2 unit PRBC. Patient with elevated cardiac enzyme that was felt to be multifactorial due to demand ischemia. 2 D echo with EF 40-45%. Dr. Jonnie Finner consulted for input on acute on chronic renal failure and recommended d/c ace as well as hydration as patient poor HD candidate. Patient continued to be confused with fluctuating mentation and MRI brain done revealing multiple bilateral watershed infarcts. Carotid dopplers without ICA stenosis. Neurology recommended ASA if GIB resolved. Dr. Carlean Purl consulted for input and EGD done on 02/08/14 Revealing erosive gastritis in gastric antrum. She's cleared to use low dose ASA in two weeks if needed. Neurology feels that patient with embolic infarcts due to A Fib and Eliquis 2.5 mg bid recommended if CrCL >30.   Subjective/Complaints: Tired after PT today Didn't sleep well  Review of Systems - cannot obtain secondary to mental status  Objective: Vital Signs: Blood pressure 167/73, pulse 62, temperature 98.2 F (36.8 C), temperature source Oral, resp. rate 18, height 5\' 2"  (1.575 m), weight 83.3 kg (183 lb 10.3 oz), last menstrual period 12/19/1968, SpO2 99.00%. No results found. Results for orders placed during the hospital encounter of 02/10/14 (from the past 72 hour(s))  GLUCOSE, CAPILLARY     Status: Abnormal   Collection Time    02/10/14  4:24 PM      Result Value Ref Range   Glucose-Capillary 280 (*) 70 - 99 mg/dL  GLUCOSE, CAPILLARY     Status: Abnormal   Collection Time    02/10/14  8:12 PM      Result Value Ref Range   Glucose-Capillary 320 (*) 70 - 99 mg/dL  GLUCOSE, CAPILLARY     Status:  Abnormal   Collection Time    02/11/14 12:02 AM      Result Value Ref Range   Glucose-Capillary 255 (*) 70 - 99 mg/dL  GLUCOSE, CAPILLARY     Status: Abnormal   Collection Time    02/11/14  4:03 AM      Result Value Ref Range   Glucose-Capillary 140 (*) 70 - 99 mg/dL  GLUCOSE, CAPILLARY     Status: Abnormal   Collection Time    02/11/14  7:41 AM      Result Value Ref Range   Glucose-Capillary 137 (*) 70 - 99 mg/dL   Comment 1 Notify RN    GLUCOSE, CAPILLARY     Status: Abnormal   Collection Time    02/11/14 11:46 AM      Result Value Ref Range   Glucose-Capillary 261 (*) 70 - 99 mg/dL   Comment 1 Notify RN    GLUCOSE, CAPILLARY     Status: Abnormal   Collection Time    02/11/14  4:55 PM      Result Value Ref Range   Glucose-Capillary 395 (*) 70 - 99 mg/dL   Comment 1 Notify RN    GLUCOSE, CAPILLARY     Status: Abnormal   Collection Time    02/11/14  8:05 PM      Result Value Ref Range   Glucose-Capillary 427 (*) 70 - 99 mg/dL  GLUCOSE, RANDOM     Status: Abnormal  Collection Time    02/11/14  9:50 PM      Result Value Ref Range   Glucose, Bld 440 (*) 70 - 99 mg/dL  GLUCOSE, CAPILLARY     Status: Abnormal   Collection Time    02/12/14 12:00 AM      Result Value Ref Range   Glucose-Capillary 367 (*) 70 - 99 mg/dL  GLUCOSE, CAPILLARY     Status: Abnormal   Collection Time    02/12/14  1:42 AM      Result Value Ref Range   Glucose-Capillary 331 (*) 70 - 99 mg/dL  GLUCOSE, CAPILLARY     Status: Abnormal   Collection Time    02/12/14  5:31 AM      Result Value Ref Range   Glucose-Capillary 202 (*) 70 - 99 mg/dL  GLUCOSE, CAPILLARY     Status: Abnormal   Collection Time    02/12/14  7:54 AM      Result Value Ref Range   Glucose-Capillary 155 (*) 70 - 99 mg/dL      Nursing note and vitals reviewed.  Constitutional: She appears well-developed and well-nourished.  HENT:  Head: Normocephalic and atraumatic.  Eyes: Conjunctivae are normal. Pupils are equal,  round, and reactive to light.  Neck: Normal range of motion. Neck supple.  Cardiovascular: Normal rate and regular rhythm.  No murmur heard.  Respiratory: Effort normal and breath sounds normal. No respiratory distress. She has no wheezes.  GI: Soft. Bowel sounds are normal. She exhibits no distension.  Musculoskeletal: She exhibits no tenderness.  Neurological: She is alert.  Oriented to self and place. Was able to ID family members and state DOB but not age. Unable to follow two step commands. Impulsive and needs occasional redirection. RUE inattention.  Skin: Skin is warm and dry.  needs cues for limited orientation  Motor strength 4/5 in bilateral delt bi tri grip HF KE ADF  Sensory is difficult to assess secondary to cognition but does correctly identify light touch on both feet  No ataxia noted FNF   Assessment/Plan: 1. Functional deficits secondary to bilateral cerebral watershed infarcts which require 3+ hours per day of interdisciplinary therapy in a comprehensive inpatient rehab setting. Physiatrist is providing close team supervision and 24 hour management of active medical problems listed below. Physiatrist and rehab team continue to assess barriers to discharge/monitor patient progress toward functional and medical goals. FIM: FIM - Bathing Bathing Steps Patient Completed: Chest;Right Arm;Left Arm;Abdomen;Right upper leg;Left upper leg Bathing: 3: Mod-Patient completes 5-7 76f 10 parts or 50-74%  FIM - Upper Body Dressing/Undressing Upper body dressing/undressing steps patient completed: Thread/unthread right sleeve of pullover shirt/dresss;Thread/unthread left sleeve of pullover shirt/dress;Put head through opening of pull over shirt/dress;Pull shirt over trunk (assist with sports bra) Upper body dressing/undressing: 3: Mod-Patient completed 50-74% of tasks FIM - Lower Body Dressing/Undressing Lower body dressing/undressing steps patient completed: Thread/unthread right  underwear leg;Thread/unthread left underwear leg;Pull underwear up/down;Thread/unthread left pants leg;Thread/unthread right pants leg;Pull pants up/down (increased time and orientation of clothes; assist footies) Lower body dressing/undressing: 3: Mod-Patient completed 50-74% of tasks  FIM - Toileting Toileting steps completed by patient: Performs perineal hygiene Toileting Assistive Devices: Grab bar or rail for support Toileting: 2: Max-Patient completed 1 of 3 steps  FIM - Radio producer Devices: Grab bars Toilet Transfers: 4-To toilet/BSC: Min A (steadying Pt. > 75%);4-From toilet/BSC: Min A (steadying Pt. > 75%)  FIM - Bed/Chair Transfer Bed/Chair Transfer Assistive Devices: Arm rests Bed/Chair Transfer:  3: Sit > Supine: Mod A (lifting assist/Pt. 50-74%/lift 2 legs);3: Supine > Sit: Mod A (lifting assist/Pt. 50-74%/lift 2 legs;3: Bed > Chair or W/C: Mod A (lift or lower assist);3: Chair or W/C > Bed: Mod A (lift or lower assist)  FIM - Locomotion: Wheelchair Locomotion: Wheelchair: 1: Total Assistance/staff pushes wheelchair (Pt<25%) FIM - Locomotion: Ambulation Locomotion: Ambulation Assistive Devices: Other (comment) (hand hold assist) Ambulation/Gait Assistance: 3: Mod assist Locomotion: Ambulation: 1: Travels less than 50 ft with moderate assistance (Pt: 50 - 74%)  Comprehension Comprehension Mode: Auditory Comprehension: 2-Understands basic 25 - 49% of the time/requires cueing 51 - 75% of the time  Expression Expression Mode: Verbal Expression: 2-Expresses basic 25 - 49% of the time/requires cueing 50 - 75% of the time. Uses single words/gestures.  Social Interaction Social Interaction: 2-Interacts appropriately 25 - 49% of time - Needs frequent redirection.  Problem Solving Problem Solving: 2-Solves basic 25 - 49% of the time - needs direction more than half the time to initiate, plan or complete simple activities  Memory Memory:  2-Recognizes or recalls 25 - 49% of the time/requires cueing 51 - 75% of the time  Medical Problem List and Plan:  1. Functional deficits secondary to Bilateral cerebral watershed infarcts related to acute anemia from GIB  2. DVT Prophylaxis/Anticoagulation: Mechanical: Sequential compression devices, below knee Bilateral lower extremities  3. Pain Management: Tylenol prn for pain.  4. Mood: No signs of distress. LCSW to follow with patient and family for evaluation and support.  5. Neuropsych: This patient is not capable of making decisions on her own behalf.  6. GIB due to erosive gatritis: Increase PPI to bid as at home.  7. HTN: Will monitor with tid checks. Continue metoprolol and Imdur.  8. Acute on CRF: Baseline Cr 1.2-1.7. Slowly improving with BUN/Cr- 62/4.42  9. ABLA: Will continue to monitor H/H 3 x week. Is slowly improving. Will add iron supplement due to h/o iron deficiency.  10 DM type 2: uncontrolled, titrate Lantus insulin at conservative dose of 5 units/HS. Monitor BS with ac/hs checks and titrate medications as indicated. Use SSI for elevated BS.  11. Chronic diastolic CHF: Check daily weights and monitor for signs of overload. Low salt diet.  12 NSTEMI due to demand ischemia: treated medically with Imdur, metoprolol and Lipitor. No ASA due to GIB.  13. Mild dementia with sundowning (was functional PTA): Nursing on acute reports insomnia with restlessness/agitation at night but not documented in chart. Will monitor for trends more closely while on inpatient rehab.   LOS (Days) 2 A FACE TO FACE EVALUATION WAS PERFORMED  Charlett Blake 02/12/2014, 9:38 AM

## 2014-02-12 NOTE — IPOC Note (Addendum)
Overall Plan of Care [facility]) Patient Details Name: [name] MRN: [id number] DOB: [birth year]  Admitting Diagnosis: CVA   Hospital Problems: Active Problems:   CVA (cerebral infarction)     Functional Problem List: Nursing Behavior;Bladder;Bowel;Endurance;Medication Management;Motor;Nutrition;Perception;Safety;Skin Integrity  PT Balance;Endurance;Motor;Perception;Safety  OT Balance;Cognition;Edema;Motor;Perception;Safety;Vision  SLP Cognition;Linguistic;Safety  TR  activity tolerance, balance, cognition, vision, safety       Basic ADL's: OT Grooming;Eating;Bathing;Dressing;Toileting     Advanced  ADL's: OT Simple Meal Preparation     Transfers: PT Bed Mobility;Bed to Chair;Car  OT Toilet;Tub/Shower     Locomotion: PT Ambulation;Wheelchair Mobility;Stairs     Additional Impairments: OT None  SLP Swallowing;Communication      TR      Anticipated Outcomes Item Anticipated Outcome  Self Feeding Independent  Swallowing  supervision for mechanical soft-regular solids, thin liquids    Basic self-care  supervision  Toileting  supervision   Bathroom Transfers supervision  Bowel/Bladder  managed min assist   Transfers  S for transfers   Locomotion  S for ambulation, min A for stairs, S for w/c mobility  Communication  minimal assist for basic level communication of wants/needs  Cognition  supervision for basic level safety  Pain  min assist   Safety/Judgment  max assist    Therapy Plan: PT Intensity: Minimum of 1-2 x/day ,45 to 90 minutes PT Frequency: 5 out of 7 days PT Duration Estimated Length of Stay: 10 to 14 days OT Intensity: Minimum of 1-2 x/day, 45 to 90 minutes OT Frequency: 5 out of 7 days OT Duration/Estimated Length of Stay: 2 weeks SLP Frequency: 5 out of 7 days SLP Duration/Estimated Length of Stay: 10-14 days       Team Interventions: Nursing Interventions Patient/Family Education;Bladder Management;Bowel Management;Disease  Management/Prevention;Pain Management;Medication Management;Skin Care/Wound Management;Cognitive Remediation/Compensation;Discharge Planning;Psychosocial Support  PT interventions Ambulation/gait training;Balance/vestibular training;Discharge planning;DME/adaptive equipment instruction;Functional mobility training;Neuromuscular re-education;UE/LE Coordination activities;UE/LE Strength taining/ROM;Splinting/orthotics;Therapeutic Exercise;Therapeutic Activities;Patient/family education;Stair training;Wheelchair propulsion/positioning  OT Interventions Publishing copy;Functional mobility training;Patient/family education;Self Care/advanced ADL retraining;Therapeutic Activities;Therapeutic Exercise;UE/LE Strength taining/ROM;Visual/perceptual remediation/compensation  SLP Interventions Cognitive remediation/compensation;Cueing hierarchy;Environmental controls;Functional tasks;Dysphagia/aspiration precaution training;Internal/external aids;Multimodal communication approach;Patient/family education;Speech/Language facilitation;Therapeutic Exercise  TR Interventions  recreation/leisure participation, therapeutic activities, functional mobility, balance, adapted equipment use, community reintegration, visual training, pt/family education, discharge planning, psychosocial support  SW/CM Interventions      Team Discharge Planning: Destination: PT-Home ,OT- Home , [place] (SNF) Projected Follow-up: PT-Home health PT, OT-   , SLP-24 hour supervision/assistance;Skilled Nursing facility Projected Equipment Needs: PT-Rolling walker with 5" wheels, OT- To be determined, SLP-  Equipment Details: PT- , OT-  Patient/family involved in discharge planning: PT- Patient;Family member/caregiver,  OT-Patient;Family member/caregiver, SLP-Family member/caregiver  MD ELOS: 10-14d Medical Rehab Prognosis:  Good Assessment: 80 y.o. female with  h/o CAD, DM type 2 with gastroparesis, CHF exacerbation 11/2103, GIB, early dementia who has had recent increase in falls with Cheryl. She was admitted on 04/25 /15 with expressive aphasia, lethargy as well as twisted lip. She was found to be in acute renal failure and anemic with hgb-5.6. She was treated with IVF and transfused with 2 unit PRBC. Patient with elevated cardiac enzyme that was felt to be multifactorial due to demand ischemia. 2 D echo with EF 40-45%. Dr. Jonnie Finner consulted for input on acute on chronic renal failure and recommended d/c ace as well as hydration as patient poor HD candidate. Patient continued to be confused with fluctuating mentation and MRI brain done revealing multiple bilateral watershed infarcts   Now requiring 24/7 Rehab  RN,MD, as well as CIR level PT, OT and SLP.  Treatment team will focus on ADLs and mobility with goals set at Supervision.  Has fatigue, CVA related may need to adjust schedule   See Team Conference Notes for weekly updates to the plan of care

## 2014-02-12 NOTE — Progress Notes (Signed)
Physical Therapy Session Note  Patient Details  Name: [name] MRN: [id number] Date of Birth: Jun 24, 1933  Today's Date: 02/12/2014 Time: 0800-0856 Time Calculation (min): 56 min  Short Term Goals: Week 1:  PT Short Term Goal 1 (Week 1): Pt will transfer supine to edge of bed, edge of bed to supine with min A.  PT Short Term Goal 2 (Week 1): Pt will transfer bed to chair, chair to bed with rolling walker and min A.  PT Short Term Goal 3 (Week 1): Pt will ambulate with rolling walker and min A about 50 feet,  PT Short Term Goal 4 (Week 1): Pt will ascend/descend 2 stiars with B rails and min A.  PT Short Term Goal 5 (Week 1): Pt will propel w/c 50 feet with min A.   Skilled Therapeutic Interventions/Progress Updates:  Pt was seen bedside in the am, finishing breakfast occasional verbal cues to focus on R side secondary to R field cut. Pt transferred supine to edge of bed with side rail, head of bed elevated and min A with multiple cues. Pt would initiate transfer then stop requiring verbal cues to complete transfer to edge of bed. Pt transferred edge of bed to w/c with min A and verbal cues. Pt ambulated with rolling walker and min to mod A for distances of 10, 35, and 35 feet. Pt ambulates with wide BOS and decreased step length, requiring multiple verbal cues to focus on task at hand. Pt was switched to lower height w/c. Pt attempted to propel w/c with B LEs about 10 feet with mod A. Pt transferred w/c to edge of bed with min A. Pt transferred edge of bed to supine with min to mod A.   Therapy Documentation Precautions:  Precautions Precautions: Fall Precaution Comments: right field cut Restrictions Weight Bearing Restrictions: No General:   Pain: No c/o pain.    Locomotion : Ambulation Ambulation/Gait Assistance: 4: Min assist;3: Mod assist   See FIM for current functional status  Therapy/Group: Individual Therapy  [name] 02/12/2014, 12:20 PM

## 2014-02-13 ENCOUNTER — Inpatient Hospital Stay (HOSPITAL_COMMUNITY): Payer: Medicare Other | Admitting: Speech Pathology

## 2014-02-13 ENCOUNTER — Encounter (HOSPITAL_COMMUNITY): Payer: Medicare Other | Admitting: Occupational Therapy

## 2014-02-13 ENCOUNTER — Inpatient Hospital Stay (HOSPITAL_COMMUNITY): Payer: Medicare Other | Admitting: Physical Therapy

## 2014-02-13 DIAGNOSIS — I6340 Cerebral infarction due to embolism of unspecified cerebral artery: Secondary | ICD-10-CM

## 2014-02-13 DIAGNOSIS — I428 Other cardiomyopathies: Secondary | ICD-10-CM

## 2014-02-13 DIAGNOSIS — I214 Non-ST elevation (NSTEMI) myocardial infarction: Secondary | ICD-10-CM

## 2014-02-13 DIAGNOSIS — I6350 Cerebral infarction due to unspecified occlusion or stenosis of unspecified cerebral artery: Secondary | ICD-10-CM

## 2014-02-13 DIAGNOSIS — I2510 Atherosclerotic heart disease of native coronary artery without angina pectoris: Secondary | ICD-10-CM

## 2014-02-13 LAB — COMPREHENSIVE METABOLIC PANEL
ALBUMIN: 2.3 g/dL — AB (ref 3.5–5.2)
ALT: 29 U/L (ref 0–35)
AST: 21 U/L (ref 0–37)
Alkaline Phosphatase: 80 U/L (ref 39–117)
BUN: 54 mg/dL — ABNORMAL HIGH (ref 6–23)
CO2: 26 mEq/L (ref 19–32)
Calcium: 8.9 mg/dL (ref 8.4–10.5)
Chloride: 104 mEq/L (ref 96–112)
Creatinine, Ser: 4.38 mg/dL — ABNORMAL HIGH (ref 0.50–1.10)
GFR calc Af Amer: 10 mL/min — ABNORMAL LOW (ref >90)
GFR calc non Af Amer: 9 mL/min — ABNORMAL LOW (ref >90)
GLUCOSE: 136 mg/dL — AB (ref 70–99)
POTASSIUM: 4.1 meq/L (ref 3.7–5.3)
Sodium: 141 mEq/L (ref 137–147)
TOTAL PROTEIN: 6.2 g/dL (ref 6.0–8.3)
Total Bilirubin: 0.4 mg/dL (ref 0.3–1.2)

## 2014-02-13 LAB — GLUCOSE, CAPILLARY
GLUCOSE-CAPILLARY: 141 mg/dL — AB (ref 70–99)
GLUCOSE-CAPILLARY: 125 mg/dL — AB (ref 70–99)
GLUCOSE-CAPILLARY: 254 mg/dL — AB (ref 70–99)
GLUCOSE-CAPILLARY: 174 mg/dL — AB (ref 70–99)
Glucose-Capillary: 183 mg/dL — ABNORMAL HIGH (ref 70–99)
Glucose-Capillary: 281 mg/dL — ABNORMAL HIGH (ref 70–99)

## 2014-02-13 NOTE — Progress Notes (Signed)
Recreational Therapy Assessment and Plan  Patient Details  Name: [name] MRN: [id number] Date of Birth: 02-Mar-1933 Today's Date: 02/13/2014  Rehab Potential: Good ELOS: 2 weeks   Assessment Problem List:  Patient Active Problem List    Diagnosis  Date Noted   .  CVA (cerebral infarction)  02/10/2014   .  Acute embolic stroke  74/94/4967   .  Erosive gastritis with hemorrhage  02/08/2014   .  Acute bilat watershed infarction vs. embolic strokes  59/16/3846   .  Chronic diastolic CHF EF 65%  99/35/7017   .  Non-ST elevation MI (NSTEMI)  02/04/2014   .  Renal failure, acute  02/04/2014   .  NSTEMI (non-ST elevated myocardial infarction)  02/04/2014   .  Nonischemic cardiomyopathy  12/12/2013   .  ARF (acute renal failure)  12/05/2013   .  Memory difficulties  12/04/2013   .  Pulmonary edema  12/03/2013   .  Acute diastolic CHF (congestive heart failure)  12/03/2013   .  URI (upper respiratory infection)  03/02/2012   .  Irregular heart rhythm  02/26/2012   .  Ventricular bigeminy  02/26/2012   .  GI bleed  08/24/2011   .  CAD (coronary artery disease)    .  CEREBRAL EMBOLISM, WITH INFARCTION  07/02/2010   .  OSTEOPOROSIS  03/21/2009   .  DEGENERATIVE JOINT DISEASE  07/24/2008   .  INSOMNIA-SLEEP DISORDER-UNSPEC  07/24/2008   .  ANEMIA  01/06/2008   .  OBSTRUCTIVE SLEEP APNEA  01/06/2008   .  GASTROPARESIS, DIABETIC  08/03/2006   .  HYPERLIPIDEMIA  08/03/2006   .  PERIPHERAL NEUROPATHY  08/03/2006   .  HYPERTENSION  08/03/2006   .  Chronic diastolic congestive heart failure  08/03/2006   .  REACTIVE AIRWAY DISEASE  08/03/2006   .  GERD  08/03/2006   .  DM (diabetes mellitus), type 2, uncontrolled  11/04/1983    Past Medical History:  Past Medical History   Diagnosis  Date   .  Asthma    .  CAD (coronary artery disease)      Pt reports MI in 2006 (no documentation). Cardiolite in 05/2002 and 07/2006 did not reveal any reversible ischemia. Pt follows with Dr. Rex Kras  at [facility].   .  CHF (congestive heart failure)      EF 25-30% with dilated LV, mild LVH, severe hypokinesis, and mod-severe reduction in RV function   .  Osteoporosis    .  HYPERTENSION  08/03/2006   .  GASTROPARESIS, DIABETIC  08/03/2006   .  HYPERLIPIDEMIA  08/03/2006   .  OBSTRUCTIVE SLEEP APNEA  01/06/2008   .  PERIPHERAL NEUROPATHY  08/03/2006   .  GERD  08/03/2006   .  LOW BACK PAIN, CHRONIC  08/03/2006   .  OSTEOPOROSIS  03/21/2009   .  CEREBRAL EMBOLISM, WITH INFARCTION  07/02/2010   .  Angina    .  Myocardial infarction  "2 or 3"   .  Pneumonia  02/26/12     "a few times; probably even today"   .  Shortness of breath      "all the time"   .  DIABETES MELLITUS, TYPE II  11/04/1983   .  Blood transfusion  08/2011   .  Lower GI bleeding  08/2011 and 01/2014   .  Chronic daily headache    .  Migraines    .  Stroke  summer 2011     "made my left hip worse"   .  Uterine cancer    .  Nonischemic cardiomyopathy  05/2010     Left heart catheterization:2011. Nonobstructive coronary artery disease.   .  Pulmonary hypertension    .  Hypertension     Past Surgical History:  Past Surgical History   Procedure  Laterality  Date   .  Esophagogastroduodenoscopy   08/26/2011     Procedure: ESOPHAGOGASTRODUODENOSCOPY (EGD); Surgeon: [name], MD; Location: [facility] ENDOSCOPY; Service: Endoscopy; Laterality: N/A;   .  Colonoscopy   08/28/2011     Procedure: COLONOSCOPY; Surgeon: [name], MD; Location: [place]; Service: Endoscopy; Laterality: N/A;   .  Vaginal hysterectomy     .  Tubal ligation     .  Cataract extraction w/ intraocular lens implant, bilateral     .  Toe surgery       "right big toe; operated on it to straighten it out; it was under"   .  Polysomnogram   10/17/2005     AHI-7.28/hr. AHI REM-20.8/hr. Average oxygen saturation range during REM and NREM was 97%. Lowest oxygen saturation during REM sleep was 90%.   .  Carotid duplex   05/28/2010     No significant  extracranial carotid artery stenosis demonstrated. Vertebrals are patent w/ antegrade flow.   .  Cardiac catheterization   05/24/2010     No intervention - recommed medical therapy.   .  Cardiovascular stress test   08/07/2006     Moderate-severe defect seen in Basal inferior, Mid inferoseptal, Mid inferior, Mid inferolateral, and Apical inferior regions - consistent w/ infarct/scar. No scintigraphic evidence of inducible myocardial ischemia.   .  Transthoracic echocardiogram   08/29/2011     EF 55-60%, moderate LVH,   .  Esophagogastroduodenoscopy  N/A  02/08/2014     Procedure: ESOPHAGOGASTRODUODENOSCOPY (EGD); Surgeon: [name], MD; Location: [facility] ENDOSCOPY; Service: Endoscopy; Laterality: N/A;    Assessment & Plan  Clinical Impression: Patient is a 80 y.o. female with h/o CAD, DM type 2 with gastroparesis, CHF exacerbation 11/2103, GIB, early dementia who has had recent increase in falls with melena. She was admitted on 04/25 /15 with expressive aphasia, lethargy as well as twisted lip. She was found to be in acute renal failure and anemic with hgb-5.6. She was treated with IVF and transfused with 2 unit PRBC. Patient with elevated cardiac enzyme that was felt to be multifactorial due to demand ischemia. 2 D echo with EF 40-45%. Dr. Jonnie Finner consulted for input on acute on chronic renal failure and recommended d/c ace as well as hydration as patient poor HD candidate. Patient continued to be confused with fluctuating mentation and MRI brain done revealing multiple bilateral watershed infarcts. Carotid dopplers without ICA stenosis. Neurology recommended ASA if GIB resolved. Dr. Carlean Purl consulted for input and EGD done on 02/08/14 Revealing erosive gastritis in gastric antrum. She's cleared to use low dose ASA in two weeks if needed. Neurology feels that patient with embolic infarcts due to A Fib and Eliquis 2.5 mg bid recommended if CrCL >30. Patient with poor renal recovery and nephrology recommended  palliative care to discuss [place]. EEG with evidence of mild encephalopathy. Today with improvement in mentation. Patient with subsequent right neglect, moderate dysarthria, problems with initiation, attention as well as aphasia. Patient transferred to CIR on 02/10/2014.   Pt presents with decreased activity tolerance, decreased functional mobility, decreased balance, decreased coordination, ataxia, decreased  vision, right inattention,  decreased attention, decreased awareness, decreased problem solving, decreased safety awareness, decreased memory & delayed processing Limiting pt's independence with leisure/community pursuits.  Leisure History/Participation Premorbid leisure interest/current participation: Petra Kuba - Programmer, multimedia gardening Other Leisure Interests: Cooking/Baking;Housework Leisure Participation Style: Alone;With Family/Friends Psychosocial / Spiritual Social interaction - Mood/Behavior: Cooperative Academic librarian Appropriate for Education?: Yes Recreational Therapy Orientation Orientation -Reviewed with patient: Available activity resources Strengths/Weaknesses Patient Strengths/Abilities: Willingness to participate Patient weaknesses: Physical limitations TR Patient demonstrates impairments in the following area(s): Endurance;Motor;Safety  Plan Rec Therapy Plan Is patient appropriate for Therapeutic Recreation?: Yes Rehab Potential: Good Treatment times per week: MIn 1 time per week >20 minutes Estimated Length of Stay: 2 weeks TR Treatment/Interventions: Adaptive equipment instruction;1:1 session;Balance/vestibular training;Cognitive remediation/compensation;Community reintegration;Functional mobility training;Patient/family education;Recreation/leisure participation;Therapeutic activities;Visual/perceptual remediation/compensation;UE/LE Coordination activities;Therapeutic exercise;Wheelchair propulsion/positioning  Recommendations for other services: None  Discharge  Criteria: Patient will be discharged from TR if patient refuses treatment 3 consecutive times without medical reason.  If treatment goals not met, if there is a change in medical status, if patient makes no progress towards goals or if patient is discharged from hospital.  The above assessment, treatment plan, treatment alternatives and goals were discussed and mutually agreed upon: by patient  Waldon Reining 02/13/2014, 3:39 PM

## 2014-02-13 NOTE — Care Management Note (Signed)
Inpatient [facility] Individual Statement of Services  Patient Name:  [name]  Date:  02/13/2014  Welcome to the [facility].  Our goal is to provide you with an individualized program based on your diagnosis and situation, designed to meet your specific needs.  With this comprehensive rehabilitation program, you will be expected to participate in at least 3 hours of rehabilitation therapies Monday-Friday, with modified therapy programming on the weekends.  Your rehabilitation program will include the following services:  Physical Therapy (PT), Occupational Therapy (OT), Speech Therapy (ST), 24 hour per day rehabilitation nursing, Case Management (Social Worker), Rehabilitation Medicine, Nutrition Services and Pharmacy Services  Weekly team conferences will be held on Wednesday to discuss your progress.  Your Social Worker will talk with you frequently to get your input and to update you on team discussions.  Team conferences with you and your family in attendance may also be held.  Expected length of stay: 2 weeks Overall anticipated outcome: supervision level  Depending on your progress and recovery, your program may change. Your Social Worker will coordinate services and will keep you informed of any changes. Your Social Worker's name and contact numbers are listed  below.  The following services may also be recommended but are not provided by the [facility]:  Comfort will be made to provide these services after discharge if needed.  Arrangements include referral to agencies that provide these services.  Your insurance has been verified to be:  Medicare & Medicaid Your primary doctor is:  Iona Beard  Pertinent information will be shared with your doctor and your insurance company.  Social Worker:  Ovidio Kin, [place] or ([phone]  Information discussed with and copy given to patient by: Elease Hashimoto, 02/13/2014, 11:19 AM

## 2014-02-13 NOTE — Progress Notes (Signed)
Occupational Therapy Session Note  Patient Details  Name: [name] MRN: [id number] Date of Birth: [birth year]  Today's Date: 02/13/2014 Time: 0802-0903 Time Calculation (min): 61 min  Short Term Goals: Week 1:  OT Short Term Goal 1 (Week 1): Pt. will compensated for right field cut by looking to right with 1 verba prompt OT Short Term Goal 2 (Week 1): Pt. will dress self with minimal assist OT Short Term Goal 3 (Week 1): Pt. will bathe self with minimal assist OT Short Term Goal 4 (Week 1): Pt. will transfer to toilet with SBA OT Short Term Goal 5 (Week 1): Pt. will transfer to bathtub with shower with minimal assist  Skilled Therapeutic Interventions/Progress Updates:   Patient seen this am for OT intervention to address activity tolerance, balance, safety and sequencing with basic self care skills.  Patient pleasant, although needed coaxing to provide verbal output.  Patient brightened quite a bit when talking about her family.  Patient's son not present this session.  Patient issued a higher wheelchair to increase safety with transitions from stand to sit, and to decrease effort needed from sit to stand.    Therapy Documentation Precautions:  Precautions Precautions: Fall Precaution Comments: right field cut Restrictions Weight Bearing Restrictions: No  Pain: Pain Assessment Pain Assessment: No/denies pain Pain Score: 0-No pain  See FIM for current functional status  Therapy/Group: Individual Therapy  Mariah Milling 02/13/2014, 12:26 PM

## 2014-02-13 NOTE — Progress Notes (Signed)
Social Work Assessment and Plan Social Work Assessment and Plan  Patient Details  Name: [name] MRN: [id number] Date of Birth: 1932/10/28  Today's Date: 02/13/2014  Problem List:  Patient Active Problem List   Diagnosis Date Noted  . CVA (cerebral infarction) 02/10/2014  . Acute embolic stroke 87/56/4332  . Erosive gastritis with hemorrhage 02/08/2014  . Acute bilat watershed infarction vs. embolic strokes 95/18/8416  . Chronic diastolic CHF EF 60% 63/10/6008  . Non-ST elevation MI (NSTEMI) 02/04/2014  . Renal failure, acute 02/04/2014  . NSTEMI (non-ST elevated myocardial infarction) 02/04/2014  . Nonischemic cardiomyopathy 12/12/2013  . ARF (acute renal failure) 12/05/2013  . Memory difficulties 12/04/2013  . Pulmonary edema 12/03/2013  . Acute diastolic CHF (congestive heart failure) 12/03/2013  . URI (upper respiratory infection) 03/02/2012  . Irregular heart rhythm 02/26/2012  . Ventricular bigeminy 02/26/2012  . GI bleed 08/24/2011  . CAD (coronary artery disease)   . CEREBRAL EMBOLISM, WITH INFARCTION 07/02/2010  . OSTEOPOROSIS 03/21/2009  . DEGENERATIVE JOINT DISEASE 07/24/2008  . INSOMNIA-SLEEP DISORDER-UNSPEC 07/24/2008  . ANEMIA 01/06/2008  . OBSTRUCTIVE SLEEP APNEA 01/06/2008  . GASTROPARESIS, DIABETIC 08/03/2006  . HYPERLIPIDEMIA 08/03/2006  . PERIPHERAL NEUROPATHY 08/03/2006  . HYPERTENSION 08/03/2006  . Chronic diastolic congestive heart failure 08/03/2006  . REACTIVE AIRWAY DISEASE 08/03/2006  . GERD 08/03/2006  . DM (diabetes mellitus), type 2, uncontrolled 11/04/1983   Past Medical History:  Past Medical History  Diagnosis Date  . Asthma   . CAD (coronary artery disease)     Pt reports MI in 2006 (no documentation).  Cardiolite in 05/2002 and 07/2006 did not reveal any reversible ischemia.  Pt follows with Dr. Rex Kras at [facility].  . CHF (congestive heart failure)     EF 25-30% with dilated LV, mild LVH, severe hypokinesis, and mod-severe reduction  in RV function  . Osteoporosis   . HYPERTENSION 08/03/2006  . GASTROPARESIS, DIABETIC 08/03/2006  . HYPERLIPIDEMIA 08/03/2006  . OBSTRUCTIVE SLEEP APNEA 01/06/2008  . PERIPHERAL NEUROPATHY 08/03/2006  . GERD 08/03/2006  . LOW BACK PAIN, CHRONIC 08/03/2006  . OSTEOPOROSIS 03/21/2009  . CEREBRAL EMBOLISM, WITH INFARCTION 07/02/2010  . Angina   . Myocardial infarction "2 or 3"  . Pneumonia 02/26/12    "a few times; probably even today"  . Shortness of breath     "all the time"  . DIABETES MELLITUS, TYPE II 11/04/1983  . Blood transfusion 08/2011  . Lower GI bleeding 08/2011 and 01/2014  . Chronic daily headache   . Migraines   . Stroke summer 2011    "made my left hip worse"  . Uterine cancer   . Nonischemic cardiomyopathy 05/2010    Left heart catheterization:2011. Nonobstructive coronary artery disease.  . Pulmonary hypertension   . Hypertension    Past Surgical History:  Past Surgical History  Procedure Laterality Date  . Esophagogastroduodenoscopy  08/26/2011    Procedure: ESOPHAGOGASTRODUODENOSCOPY (EGD);  Surgeon: [name], MD;  [facility] ENDOSCOPY;  Service: Endoscopy;  Laterality: N/A;  . Colonoscopy  08/28/2011    Procedure: COLONOSCOPY;  Surgeon: [name], MD;  Location: [place];  Service: Endoscopy;  Laterality: N/A;  . Vaginal hysterectomy    . Tubal ligation    . Cataract extraction w/ intraocular lens  implant, bilateral    . Toe surgery      "right big toe; operated on it to straighten it out; it was under"  . Polysomnogram  10/17/2005    AHI-7.28/hr. AHI REM-20.8/hr. Average oxygen saturation  range during REM and NREM was 97%. Lowest oxygen saturation during REM sleep was 90%.  . Carotid duplex  05/28/2010    No significant extracranial carotid artery stenosis demonstrated. Vertebrals are patent w/ antegrade flow.  . Cardiac catheterization  05/24/2010    No intervention - recommed medical therapy.  . Cardiovascular stress test  08/07/2006     Moderate-severe defect seen in Basal inferior, Mid inferoseptal, Mid inferior, Mid inferolateral, and Apical inferior regions - consistent w/ infarct/scar. No scintigraphic evidence of inducible myocardial ischemia.  . Transthoracic echocardiogram  08/29/2011    EF 55-60%, moderate LVH,   . Esophagogastroduodenoscopy N/A 02/08/2014    Procedure: ESOPHAGOGASTRODUODENOSCOPY (EGD);  Surgeon: [name], MD;  Location: [facility] ENDOSCOPY;  Service: Endoscopy;  Laterality: N/A;   Social History:  reports that she has never smoked. She quit smokeless tobacco use about 7 years ago. Her smokeless tobacco use included Snuff. She reports that she does not drink alcohol or use illicit drugs.  Family / Support Systems Marital Status: Widow/Widower Patient Roles: Parent Children: [name]-son  [phone]-cell  [name]-daughter  [phone]-cell Other Supports: [name]  [phone]-cell Anticipated Caregiver: Son and daughters Ability/Limitations of Caregiver: [name] if son preparde to provide 24 hr care Caregiver Availability: Other (Comment) (Still deciding on a plan) Family Dynamics: Close knit family pt moved in with sonwhen became sick and could not care for herself.  She was still able to do her own self care and was mobile.  Son is disabled himself and daughter's work.  So discussing a care plan for discharge.  Social History Preferred language: English Religion: Christian Cultural Background: No issues Education: High School Read: Yes Write: Yes Employment Status: Retired Freight forwarder Issues: No issues Guardian/Conservator: None-according to MD pt is not capable of making her own decisions while here.  Will look toward children to make any decisions while here due to no formal POA    Abuse/Neglect Physical Abuse: Denies Verbal Abuse: Denies Sexual Abuse: Denies Exploitation of patient/patient's resources: Denies Self-Neglect: Denies  Emotional Status Pt's affect,  behavior adn adjustment status: Pt seems motivated she nods and makes eye contact, she allows her son to answer for her.  She has always been able to remain independent and son is hopeful she can get to this level again.  Son states;" She was taking care of herself." Recent Psychosocial Issues: Other medical issues-seemed to be a slow slide, one thing after another Pyschiatric History: No history deferred depression screen due to no talking and stroke deficits.  Will re-assess and evaluate then.  Son reports she is doing better now then when she first got here.  Will monitor thorugh the team. Substance Abuse History: No issues  Patient / Family Perceptions, Expectations & Goals Pt/Family understanding of illness & functional limitations: Son is able to explain her stroke and deficits from this.  She has had multiple issues since coming into the hospital and is just now starting to do better.  SOn plans to be here daily and watch pt in therapies to see how she does.  Discussed team feels pt will need 24 hr care at discharge, so can discuss with family members. Premorbid pt/family roles/activities: Mother, Cory Roughen, retiree, Church member, etc Anticipated changes in roles/activities/participation: resume Pt/family expectations/goals: Son states: " I hope she does well here I know she will do better here than at [facility] [employer]."  Pt did not answer when asked what she hoped to accomplish while here.  She nodded and said yes.  Community Resources [employer] Scripts: None Premorbid Home Care/DME Agencies: None Transportation available at discharge: Family has always provide transportation due to pt ahs never driven Resource referrals recommended: Support group (specify) (CVA Support group)  Discharge Planning Living Arrangements: [place]: Children;Other relatives;Friends/neighbors;Church/faith community Type of Residence: Private residence Insurance Resources: Medicare;Medicaid  (specify county) Sports coach Co) Museum/gallery curator Resources: Radio broadcast assistant Screen Referred: No Living Expenses: Lives with family Money Management: Family Does the patient have any problems obtaining your medications?: No Home Management: Son does Patient/Family Preliminary Plans: Unsure at this time. depends upon her progress here.  Son aware she will need 24 hr care and is discussing with his sister's.  Aware of the options of hired assist and NHP.  Pt was going to a NH on acute then son decided he wanted her here to do more therapy. Social Work Anticipated Follow Up Needs: HH/OP;SNF;Support Group  Clinical Impression Pt has multiple medical issues and will do well here but will still require 24 hr care upon discharge.  Son's main concern is here getting therapies and, but am unsure how realistic he is regarding her  Recovery from her medical issues and bi-lateral CVA's.  He and daughter's will need to decide on the discharge plan and what is realistic for them to provide.  Will await team conference and work on safe discharge Plan for pt.  Gardiner Rhyme Edis Huish 02/13/2014, 2:28 PM

## 2014-02-13 NOTE — Progress Notes (Signed)
Physical Therapy Session Note  Patient Details  Name: [name] MRN: [id number] Date of Birth: April 11, 1933  Today's Date: 02/13/2014 Time: 0930-1003 and 1300-1400 Time Calculation (min): 33 min and 60 min  Short Term Goals: Week 1:  PT Short Term Goal 1 (Week 1): Pt will transfer supine to edge of bed, edge of bed to supine with min A.  PT Short Term Goal 2 (Week 1): Pt will transfer bed to chair, chair to bed with rolling walker and min A.  PT Short Term Goal 3 (Week 1): Pt will ambulate with rolling walker and min A about 50 feet,  PT Short Term Goal 4 (Week 1): Pt will ascend/descend 2 stiars with B rails and min A.  PT Short Term Goal 5 (Week 1): Pt will propel w/c 50 feet with min A.   Skilled Therapeutic Interventions/Progress Updates:    Treatment Session 1: Pt received seated in w/c with quick release belt on for safety. Pt oriented x2 to self and place. Per conversation with son (present), pt lived with son and performed functional mobility with supervision to mod I using personal rolling walker in home and community. Per son, pt negotiated 2 steps to enter home using rolling walker with supervision. Pt lives with son only but various family members (pt's daughter and granddaughter) also supervise pt, as needed.   Session focused on assessing/addressing stability/independence with functional mobility. Pt performed multiple sit<>stand transfers from w/c with rolling walker and min A, min-mod tactile/verbal cueing for hand placement. Performed gait x40' in controlled environment with rolling walker, min A, mod multimodal cueing for obstacle negotiation. Performed w/c mobility x35' in controlled environment with mod A, HOH assist for w/c propulsion technique with effective within-session carryover, subtle to mod cueing for R-sided obstacle negotiation. Educated pt/son on compensatory strategies for R visual field cut. Son verbalized understanding. Session ended in pt room, where pt was  left seated in w/c with quick release belt on for safety and all needs within reach.  Treatment Session 2: Pt received seated in w/c with son, daughter, and granddaughter present. Pt agreeable to session, which focused on initiating hands-on family training, increasing pt safety with negotiating 2 steps to enter home. Transported pt to gym, where son demonstrated pt's technique for negotiating 2 steps with rolling walker. PT discussed stair negotiation technique, how it might be modified to increase safety. Pt/Pt negotiated 3 steps without rails using rolling walker (stabilized by son) with step-to pattern and min A, max verbal cueing to maintain hands on rolling walker as opposed to utilizing hand rails with ineffective carryover. Pt appears to be in distress while ascending stairs, but exhibits difficulty expressing feelings/needs. During descent of stairs, pt expresses urgent need to urinate. Completed stair trial then transported pt in w/c to nearest bathroom. Episode of bladder incontinence noted; nurse tech notified. Pt performed multiple sit<>stand transfers from toilet, bed, and w/c with rolling walker, min guard-min A. While changing clothing, performing hygiene, pt performed dynamic sitting balance with close supervision, dynamic standing balance with min guard-min A with RUE support at rolling walker. Verbal cueing during lower body dressing focused on safety awareness. Pt exhibits impulsivity, tendency to attempt to stand from unlocked w/c. Session ended in pt room, where pt was left seated in w/c with quick release belt on for safety, family present, and all needs within reach.  Therapy Documentation Precautions:  Precautions Precautions: Fall Precaution Comments: right field cut Restrictions Weight Bearing Restrictions: No Pain:   Pt reports  no pain during AM/PM PT sessions.  See FIM for current functional status  Therapy/Group: Individual Therapy  Malva Cogan Krystyne Tewksbury 02/13/2014, 8:04 PM

## 2014-02-13 NOTE — Progress Notes (Signed)
80 y.o. female with h/o CAD, DM type 2 with gastroparesis, CHF exacerbation 11/2103, GIB, early dementia who has had recent increase in falls with melena. She was admitted on 04/25 /15 with expressive aphasia, lethargy as well as twisted lip. She was found to be in acute renal failure and anemic with hgb-5.6. She was treated with IVF and transfused with 2 unit PRBC. Patient with elevated cardiac enzyme that was felt to be multifactorial due to demand ischemia. 2 D echo with EF 40-45%. Dr. Jonnie Finner consulted for input on acute on chronic renal failure and recommended d/c ace as well as hydration as patient poor HD candidate. Patient continued to be confused with fluctuating mentation and MRI brain done revealing multiple bilateral watershed infarcts. Carotid dopplers without ICA stenosis. Neurology recommended ASA if GIB resolved. Dr. Carlean Purl consulted for input and EGD done on 02/08/14 Revealing erosive gastritis in gastric antrum. She's cleared to use low dose ASA in two weeks if needed. Neurology feels that patient with embolic infarcts due to A Fib and Eliquis 2.5 mg bid recommended if CrCL >30.   Subjective/Complaints: Ready for ADLs with OT Oriented to person, hospital but not [facility]  Review of Systems - cannot obtain secondary to mental status  Objective: Vital Signs: Blood pressure 174/71, pulse 63, temperature 98 F (36.7 C), temperature source Oral, resp. rate 16, height $RemoveBe'5\' 2"'DIFeYDHvu$  (1.575 m), weight 82.3 kg (181 lb 7 oz), last menstrual period 12/19/1968, SpO2 98.00%. No results found. Results for orders placed during the hospital encounter of 02/10/14 (from the past 72 hour(s))  GLUCOSE, CAPILLARY     Status: Abnormal   Collection Time    02/10/14  4:24 PM      Result Value Ref Range   Glucose-Capillary 280 (*) 70 - 99 mg/dL  GLUCOSE, CAPILLARY     Status: Abnormal   Collection Time    02/10/14  8:12 PM      Result Value Ref Range   Glucose-Capillary 320 (*) 70 - 99 mg/dL  GLUCOSE,  CAPILLARY     Status: Abnormal   Collection Time    02/11/14 12:02 AM      Result Value Ref Range   Glucose-Capillary 255 (*) 70 - 99 mg/dL  GLUCOSE, CAPILLARY     Status: Abnormal   Collection Time    02/11/14  4:03 AM      Result Value Ref Range   Glucose-Capillary 140 (*) 70 - 99 mg/dL  GLUCOSE, CAPILLARY     Status: Abnormal   Collection Time    02/11/14  7:41 AM      Result Value Ref Range   Glucose-Capillary 137 (*) 70 - 99 mg/dL   Comment 1 Notify RN    GLUCOSE, CAPILLARY     Status: Abnormal   Collection Time    02/11/14 11:46 AM      Result Value Ref Range   Glucose-Capillary 261 (*) 70 - 99 mg/dL   Comment 1 Notify RN    GLUCOSE, CAPILLARY     Status: Abnormal   Collection Time    02/11/14  4:55 PM      Result Value Ref Range   Glucose-Capillary 395 (*) 70 - 99 mg/dL   Comment 1 Notify RN    GLUCOSE, CAPILLARY     Status: Abnormal   Collection Time    02/11/14  8:05 PM      Result Value Ref Range   Glucose-Capillary 427 (*) 70 - 99 mg/dL  GLUCOSE, RANDOM  Status: Abnormal   Collection Time    02/11/14  9:50 PM      Result Value Ref Range   Glucose, Bld 440 (*) 70 - 99 mg/dL  GLUCOSE, CAPILLARY     Status: Abnormal   Collection Time    02/12/14 12:00 AM      Result Value Ref Range   Glucose-Capillary 367 (*) 70 - 99 mg/dL  GLUCOSE, CAPILLARY     Status: Abnormal   Collection Time    02/12/14  1:42 AM      Result Value Ref Range   Glucose-Capillary 331 (*) 70 - 99 mg/dL  GLUCOSE, CAPILLARY     Status: Abnormal   Collection Time    02/12/14  5:31 AM      Result Value Ref Range   Glucose-Capillary 202 (*) 70 - 99 mg/dL  GLUCOSE, CAPILLARY     Status: Abnormal   Collection Time    02/12/14  7:54 AM      Result Value Ref Range   Glucose-Capillary 155 (*) 70 - 99 mg/dL  GLUCOSE, CAPILLARY     Status: Abnormal   Collection Time    02/12/14 11:33 AM      Result Value Ref Range   Glucose-Capillary 163 (*) 70 - 99 mg/dL  GLUCOSE, CAPILLARY     Status:  Abnormal   Collection Time    02/12/14  4:45 PM      Result Value Ref Range   Glucose-Capillary 188 (*) 70 - 99 mg/dL  GLUCOSE, CAPILLARY     Status: Abnormal   Collection Time    02/12/14  8:06 PM      Result Value Ref Range   Glucose-Capillary 226 (*) 70 - 99 mg/dL  GLUCOSE, CAPILLARY     Status: Abnormal   Collection Time    02/13/14 12:17 AM      Result Value Ref Range   Glucose-Capillary 183 (*) 70 - 99 mg/dL   Comment 1 Notify RN    GLUCOSE, CAPILLARY     Status: Abnormal   Collection Time    02/13/14  4:07 AM      Result Value Ref Range   Glucose-Capillary 141 (*) 70 - 99 mg/dL  COMPREHENSIVE METABOLIC PANEL     Status: Abnormal   Collection Time    02/13/14  6:55 AM      Result Value Ref Range   Sodium 141  137 - 147 mEq/L   Potassium 4.1  3.7 - 5.3 mEq/L   Chloride 104  96 - 112 mEq/L   CO2 26  19 - 32 mEq/L   Glucose, Bld 136 (*) 70 - 99 mg/dL   BUN 54 (*) 6 - 23 mg/dL   Creatinine, Ser 4.38 (*) 0.50 - 1.10 mg/dL   Calcium 8.9  8.4 - 10.5 mg/dL   Total Protein 6.2  6.0 - 8.3 g/dL   Albumin 2.3 (*) 3.5 - 5.2 g/dL   AST 21  0 - 37 U/L   ALT 29  0 - 35 U/L   Alkaline Phosphatase 80  39 - 117 U/L   Total Bilirubin 0.4  0.3 - 1.2 mg/dL   GFR calc non Af Amer 9 (*) >90 mL/min   GFR calc Af Amer 10 (*) >90 mL/min   Comment: (NOTE)     The eGFR has been calculated using the CKD EPI equation.     This calculation has not been validated in all clinical situations.     eGFR's persistently <  90 mL/min signify possible Chronic Kidney     Disease.  GLUCOSE, CAPILLARY     Status: Abnormal   Collection Time    02/13/14  7:59 AM      Result Value Ref Range   Glucose-Capillary 125 (*) 70 - 99 mg/dL      Nursing note and vitals reviewed.  Constitutional: She appears well-developed and well-nourished.  HENT:  Head: Normocephalic and atraumatic.  Eyes: Conjunctivae are normal. Pupils are equal, round, and reactive to light.  Neck: Normal range of motion. Neck supple.   Cardiovascular: Normal rate and regular rhythm.  No murmur heard.  Respiratory: Effort normal and breath sounds normal. No respiratory distress. She has no wheezes.  GI: Soft. Bowel sounds are normal. She exhibits no distension.  Musculoskeletal: She exhibits no tenderness.  Neurological: She is alert.  Oriented to self and place. Was able to ID family members and state DOB but not age. Unable to follow two step commands. Impulsive and needs occasional redirection. RUE inattention.  Skin: Skin is warm and dry.  needs cues for limited orientation  Motor strength 4/5 in bilateral delt bi tri grip HF KE ADF  Sensory is difficult to assess secondary to cognition but does correctly identify light touch on both feet  No ataxia noted FNF   Assessment/Plan: 1. Functional deficits secondary to bilateral cerebral watershed infarcts which require 3+ hours per day of interdisciplinary therapy in a comprehensive inpatient rehab setting. Physiatrist is providing close team supervision and 24 hour management of active medical problems listed below. Physiatrist and rehab team continue to assess barriers to discharge/monitor patient progress toward functional and medical goals. FIM: FIM - Bathing Bathing Steps Patient Completed: Chest;Right Arm;Left Arm;Abdomen;Right upper leg;Left upper leg Bathing: 3: Mod-Patient completes 5-7 26f 10 parts or 50-74%  FIM - Upper Body Dressing/Undressing Upper body dressing/undressing steps patient completed: Thread/unthread right sleeve of pullover shirt/dresss;Thread/unthread left sleeve of pullover shirt/dress;Put head through opening of pull over shirt/dress;Pull shirt over trunk (assist with sports bra) Upper body dressing/undressing: 3: Mod-Patient completed 50-74% of tasks FIM - Lower Body Dressing/Undressing Lower body dressing/undressing steps patient completed: Thread/unthread right underwear leg;Thread/unthread left underwear leg;Pull underwear  up/down;Thread/unthread left pants leg;Thread/unthread right pants leg;Pull pants up/down (increased time and orientation of clothes; assist footies) Lower body dressing/undressing: 3: Mod-Patient completed 50-74% of tasks  FIM - Toileting Toileting steps completed by patient: Performs perineal hygiene Toileting Assistive Devices: Grab bar or rail for support Toileting: 2: Max-Patient completed 1 of 3 steps  FIM - Diplomatic Services operational officer Devices: Grab bars Toilet Transfers: 4-To toilet/BSC: Min A (steadying Pt. > 75%);4-From toilet/BSC: Min A (steadying Pt. > 75%)  FIM - Bed/Chair Transfer Bed/Chair Transfer Assistive Devices: Arm rests Bed/Chair Transfer: 4: Supine > Sit: Min A (steadying Pt. > 75%/lift 1 leg);4: Sit > Supine: Min A (steadying pt. > 75%/lift 1 leg);4: Bed > Chair or W/C: Min A (steadying Pt. > 75%);4: Chair or W/C > Bed: Min A (steadying Pt. > 75%)  FIM - Locomotion: Wheelchair Locomotion: Wheelchair: 1: Travels less than 50 ft with moderate assistance (Pt: 50 - 74%) FIM - Locomotion: Ambulation Locomotion: Ambulation Assistive Devices: Designer, industrial/product Ambulation/Gait Assistance: 4: Min assist;3: Mod assist Locomotion: Ambulation: 1: Travels less than 50 ft with moderate assistance (Pt: 50 - 74%)  Comprehension Comprehension Mode: Auditory Comprehension: 2-Understands basic 25 - 49% of the time/requires cueing 51 - 75% of the time  Expression Expression Mode: Verbal Expression: 2-Expresses basic 25 - 49%  of the time/requires cueing 50 - 75% of the time. Uses single words/gestures.  Social Interaction Social Interaction: 2-Interacts appropriately 25 - 49% of time - Needs frequent redirection.  Problem Solving Problem Solving: 2-Solves basic 25 - 49% of the time - needs direction more than half the time to initiate, plan or complete simple activities  Memory Memory: 2-Recognizes or recalls 25 - 49% of the time/requires cueing 51 - 75% of the  time  Medical Problem List and Plan:  1. Functional deficits secondary to Bilateral cerebral watershed infarcts related to acute anemia from GIB  2. DVT Prophylaxis/Anticoagulation: Mechanical: Sequential compression devices, below knee Bilateral lower extremities  3. Pain Management: Tylenol prn for pain.  4. Mood: No signs of distress. LCSW to follow with patient and family for evaluation and support.  5. Neuropsych: This patient is not capable of making decisions on her own behalf.  6. GIB due to erosive gatritis: Increase PPI to bid as at home.  7. HTN: Will monitor with tid checks. Continue metoprolol and Imdur.  8. Acute on CRF: Baseline Cr 1.2-1.7. No improvement of renal failure thus far 9. ABLA: Will continue to monitor H/H 3 x week. Is slowly improving. Will add iron supplement due to h/o iron deficiency.  10 DM type 2: Will resume Lantus insulin at dose of 10 units/HS. Monitor BS with ac/hs checks and titrate medications as indicated. Use SSI for elevated BS.  11. Chronic diastolic CHF: Check daily weights and monitor for signs of overload. Low salt diet.  12 NSTEMI due to demand ischemia: treated medically with Imdur, metoprolol and Lipitor. No ASA due to GIB.  13. Mild dementia with sundowning (was functional PTA): Nursing on acute reports insomnia with restlessness/agitation at night but not documented in chart. Will monitor for trends more closely while on inpatient rehab.   LOS (Days) 3 A FACE TO FACE EVALUATION WAS PERFORMED  Charlett Blake 02/13/2014, 8:38 AM

## 2014-02-13 NOTE — Plan of Care (Signed)
Problem: RH KNOWLEDGE DEFICIT Goal: RH STG INCREASE KNOWLEDGE OF DIABETES Patient 's family will be able to verbalize management of diabetes with min assist  Outcome: Not Applicable Date Met:  82/95/62 Family unavailable

## 2014-02-13 NOTE — Progress Notes (Signed)
Speech Language Pathology Daily Session Note  Patient Details  Name: [name] MRN: [id number] Date of Birth: [birth year]  Today's Date: 02/13/2014 Time: 3976-7341 Time Calculation (min): 35 min  Short Term Goals: Week 1: SLP Short Term Goal 1 (Week 1): Patient will initiate response to biographical and immediate needs questions with minimal cues. SLP Short Term Goal 2 (Week 1): Patient will follow 1-step/basic level directions to complete functional tasks wtih 75% accuracy.  SLP Short Term Goal 3 (Week 1): Patient will sustain attention to task and during conversation for 2-3 minutes with moderate cues. SLP Short Term Goal 4 (Week 1): patient will consume least restrictive diet with minimal assistance and no ovet s/s aspiration  Skilled Therapeutic Interventions: Skilled treatment session focused on addressing cognitive-linguistic goals.  SLP facilitated session with picture description task and Total assist cues to maintain arousal to task.  SLP downgraded task to coin sorting and she continued to require Max-Total assist multi-modal cues.  Due to decreased arousal, SLP provided Mod physical assist to transfer patient back to bed.  Patient also required Total assist to return demonstration of use of call bell and session was ended 10 minutes early.     FIM:  Comprehension Comprehension Mode: Auditory Comprehension: 1-Understands basic less than 25% of the time/requires cueing 75% of the time Expression Expression Mode: Verbal Expression: 1-Expresses basis less than 25% of the time/requires cueing greater than 75% of the time. Social Interaction Social Interaction: 1-Interacts appropriately less than 25% of the time. May be withdrawn or combative. Problem Solving Problem Solving: 1-Solves basic less than 25% of the time - needs direction nearly all the time or does not effectively solve problems and may need a restraint for safety Memory Memory: 1-Recognizes or recalls less than 25%  of the time/requires cueing greater than 75% of the time  Pain Pain Assessment Pain Assessment: No/denies pain  Therapy/Group: Individual Therapy  Carmelia Roller., [place]  Mora Appl 02/13/2014, 4:15 PM

## 2014-02-14 ENCOUNTER — Encounter (HOSPITAL_COMMUNITY): Payer: Medicare Other | Admitting: Occupational Therapy

## 2014-02-14 ENCOUNTER — Inpatient Hospital Stay (HOSPITAL_COMMUNITY): Payer: Self-pay | Admitting: Physical Therapy

## 2014-02-14 ENCOUNTER — Inpatient Hospital Stay (HOSPITAL_COMMUNITY): Payer: Self-pay | Admitting: Occupational Therapy

## 2014-02-14 ENCOUNTER — Inpatient Hospital Stay (HOSPITAL_COMMUNITY): Payer: Medicare Other

## 2014-02-14 DIAGNOSIS — I214 Non-ST elevation (NSTEMI) myocardial infarction: Secondary | ICD-10-CM

## 2014-02-14 DIAGNOSIS — I6350 Cerebral infarction due to unspecified occlusion or stenosis of unspecified cerebral artery: Secondary | ICD-10-CM

## 2014-02-14 DIAGNOSIS — I6340 Cerebral infarction due to embolism of unspecified cerebral artery: Secondary | ICD-10-CM

## 2014-02-14 DIAGNOSIS — I428 Other cardiomyopathies: Secondary | ICD-10-CM

## 2014-02-14 DIAGNOSIS — I2510 Atherosclerotic heart disease of native coronary artery without angina pectoris: Secondary | ICD-10-CM

## 2014-02-14 LAB — GLUCOSE, CAPILLARY
GLUCOSE-CAPILLARY: 195 mg/dL — AB (ref 70–99)
GLUCOSE-CAPILLARY: 270 mg/dL — AB (ref 70–99)
GLUCOSE-CAPILLARY: 76 mg/dL (ref 70–99)
Glucose-Capillary: 124 mg/dL — ABNORMAL HIGH (ref 70–99)
Glucose-Capillary: 163 mg/dL — ABNORMAL HIGH (ref 70–99)
Glucose-Capillary: 237 mg/dL — ABNORMAL HIGH (ref 70–99)
Glucose-Capillary: 83 mg/dL (ref 70–99)

## 2014-02-14 LAB — URINALYSIS, ROUTINE W REFLEX MICROSCOPIC
Bilirubin Urine: NEGATIVE
GLUCOSE, UA: NEGATIVE mg/dL
KETONES UR: NEGATIVE mg/dL
Nitrite: NEGATIVE
PH: 7.5 (ref 5.0–8.0)
PROTEIN: 100 mg/dL — AB
Specific Gravity, Urine: 1.011 (ref 1.005–1.030)
Urobilinogen, UA: 0.2 mg/dL (ref 0.0–1.0)

## 2014-02-14 LAB — URINE MICROSCOPIC-ADD ON

## 2014-02-14 MED ORDER — PANTOPRAZOLE SODIUM 40 MG PO TBEC
40.0000 mg | DELAYED_RELEASE_TABLET | Freq: Two times a day (BID) | ORAL | Status: DC
  Administered 2014-02-14 – 2014-02-20 (×13): 40 mg via ORAL
  Filled 2014-02-14 (×12): qty 1

## 2014-02-14 NOTE — Progress Notes (Signed)
Occupational Therapy Session Note  Patient Details  Name: [name] MRN: [id number] Date of Birth: 1932/12/31  Today's Date: 02/14/2014 Time: 5366-4403 Time Calculation (min): 37 min  Skilled Therapeutic Interventions/Progress Updates:    Pt practiced tub/shower transfers using the tub bench during session.  She needed min assist and max instructional cueing to sequence through task and initiate movements.  Pt with increased respirations noted after transfer to the tub bench but HR 66 and O2 99% on room air.  Pt at times closes eyes and needed max instructional cueing to open them.  Also had pt work on standing balance while placing canned goods up in the cabinets with the [place].  Emphasis on having her maintain balance, scan to the right side to locate the items, and also state what the items were.  She was able to state what the items were 75% of the time with max questioning cues.  On two occasions when asked what she was holding she would state the name of the item she had put up in the cabinet just previous to the one she was holding.  Noted at conclusion of activity pt with eyes closed and holding her head.  When asked if her head hurt she would mumble uh huh but would not state "yes or no".  Nursing made aware and pt transferred back to bed and positioned on her left side with min assist.    Therapy Documentation Precautions:  Precautions Precautions: Fall Precaution Comments: right field cut Restrictions Weight Bearing Restrictions: No  Pain: Pain Assessment Pain Assessment: Faces Faces Pain Scale: Hurts a little bit Pain Type: Acute pain Pain Location: Head Pain Intervention(s): Repositioned;Emotional support;RN made aware ADL: See FIM for current functional status  Therapy/Group: Individual Therapy  Cindra Presume OTR/L 02/14/2014, 3:46 PM

## 2014-02-14 NOTE — Progress Notes (Signed)
Speech Language Pathology Daily Session Note  Patient Details  Name: [name] MRN: [id number] Date of Birth: September 09, 1933  Today's Date: 02/14/2014 Time: 0830-0925 Time Calculation (min): 55 min  Short Term Goals: Week 1: SLP Short Term Goal 1 (Week 1): Patient will initiate response to biographical and immediate needs questions with minimal cues. SLP Short Term Goal 2 (Week 1): Patient will follow 1-step/basic level directions to complete functional tasks wtih 75% accuracy.  SLP Short Term Goal 3 (Week 1): Patient will sustain attention to task and during conversation for 2-3 minutes with moderate cues. SLP Short Term Goal 4 (Week 1): patient will consume least restrictive diet with minimal assistance and no ovet s/s aspiration  Skilled Therapeutic Interventions: Skilled treatment focused on swallowing and cognitive-linguistic goals. Pt with increased level of alertness as compared to previous date. SLP facilitated session with observation of breakfast meal, providing Mod verbal and tactile cues due to fast rate and oral pocketing. Cough noted x1 during period of oral pocketing - suspect premature spillage of bolus material. RN administered medications whole with liquid via straw with no overt s/s of aspiration noted. Recommend to continue medications whole with liquid as tolerated, however with full supervision during meals due to amount of cueing needed. Pt required Max cues for initiation during structured cognitive-linguistic tasks, however once initiated could describe pictures at the phrase to sentence level with Mod cues. Pt required Max sentence completion and phonemic cues for naming common objects during confrontational naming task. She answered basic yes/no questions with 80% accuracy, however accuracy was at 30% for more complex questions. Continue plan of care.   FIM:  Comprehension Comprehension Mode: Auditory Comprehension: 4-Understands basic 75 - 89% of the time/requires  cueing 10 - 24% of the time Expression Expression Mode: Verbal Expression: 2-Expresses basic 25 - 49% of the time/requires cueing 50 - 75% of the time. Uses single words/gestures. Social Interaction Social Interaction: 2-Interacts appropriately 25 - 49% of time - Needs frequent redirection. Problem Solving Problem Solving: 3-Solves basic 50 - 74% of the time/requires cueing 25 - 49% of the time Memory Memory: 2-Recognizes or recalls 25 - 49% of the time/requires cueing 51 - 75% of the time FIM - Eating Eating Activity: 5: Needs verbal cues/supervision;4: Helper checks for pocketed food;4: Helper occasionally scoops food on utensil  Pain Pain Assessment Pain Assessment: No/denies pain Pain Score: 0-No pain PAINAD (Pain Assessment in Advanced Dementia) Breathing: normal Negative Vocalization: none Facial Expression: smiling or inexpressive Body Language: relaxed Consolability: no need to console PAINAD Score: 0  Therapy/Group: Individual Therapy   Germain Osgood, M.A. CCC-SLP [phone]  Germain Osgood 02/14/2014, 11:17 AM

## 2014-02-14 NOTE — Progress Notes (Signed)
Occupational Therapy Session Note  Patient Details  Name: [name] MRN: [id number] Date of Birth: 30-Sep-1933  Today's Date: 02/14/2014 Time: 0725-0823 Time Calculation (min): 58 min  Short Term Goals: Week 1:  OT Short Term Goal 1 (Week 1): Pt. will compensated for right field cut by looking to right with 1 verba prompt OT Short Term Goal 2 (Week 1): Pt. will dress self with minimal assist OT Short Term Goal 3 (Week 1): Pt. will bathe self with minimal assist OT Short Term Goal 4 (Week 1): Pt. will transfer to toilet with SBA OT Short Term Goal 5 (Week 1): Pt. will transfer to bathtub with shower with minimal assist  Skilled Therapeutic Interventions/Progress Updates:    Patient seen this am for OT individual treatment session w/ focus on activity tolerance, endurance, balance, safety and sequencing/task initiation with basic self care skills. Patient participated in ADL retraining/bathing at shower level this am using tub bench and grab bars. She required increased time for tasks and mod-max encouragement to provide verbal output other than "yes/no" answers to questions.  Patient completed dressing UB/LB as per FIM and grooming at sink level given increased time and vc's/set-up. Pt was noted to open toothpaste w/ right hand and not replace cap. She brushed her teeth & OT asked her to replace the cap, pt was unable to locate cap in her tightly fisted hand and required maximal verbal and tactile cues to find it in her right hand.  Therapy Documentation Precautions:  Precautions Precautions: Fall Precaution Comments: right field cut Restrictions Weight Bearing Restrictions: No   Vital Signs: Therapy Vitals Pulse Rate: 72 BP: 170/70 mmHg Pain: No, denies pain.      See FIM for current functional status  Therapy/Group: Individual Therapy  Amy B Barnhill 02/14/2014, 10:01 AM

## 2014-02-14 NOTE — Progress Notes (Signed)
UA results called to  D. Clayton ,[place]. culture pending . No new orders at this time . Continue with plan care .      Mliss Sax

## 2014-02-14 NOTE — Progress Notes (Signed)
80 y.o. female with h/o CAD, DM type 2 with gastroparesis, CHF exacerbation 11/2103, GIB, early dementia who has had recent increase in falls with melena. She was admitted on 04/25 /15 with expressive aphasia, lethargy as well as twisted lip. She was found to be in acute renal failure and anemic with hgb-5.6. She was treated with IVF and transfused with 2 unit PRBC. Patient with elevated cardiac enzyme that was felt to be multifactorial due to demand ischemia. 2 D echo with EF 40-45%. Dr. Arlean Hopping consulted for input on acute on chronic renal failure and recommended d/c ace as well as hydration as patient poor HD candidate. Patient continued to be confused with fluctuating mentation and MRI brain done revealing multiple bilateral watershed infarcts. Carotid dopplers without ICA stenosis. Neurology recommended ASA if GIB resolved. Dr. Leone Payor consulted for input and EGD done on 02/08/14 Revealing erosive gastritis in gastric antrum. She's cleared to use low dose ASA in two weeks if needed. Neurology feels that patient with embolic infarcts due to A Fib and Eliquis 2.5 mg bid recommended if CrCL >30.   Subjective/Complaints: Ready for ADLs with OT Oriented to person, hospital but not [place]  Review of Systems - cannot obtain secondary to mental status  Objective: Vital Signs: Blood pressure 186/73, pulse 70, temperature 99.1 F (37.3 C), temperature source Oral, resp. rate 20, height 5\' 2"  (1.575 m), weight 81.9 kg (180 lb 8.9 oz), last menstrual period 12/19/1968, SpO2 99.00%. No results found. Results for orders placed during the hospital encounter of 02/10/14 (from the past 72 hour(s))  GLUCOSE, CAPILLARY     Status: Abnormal   Collection Time    02/11/14  7:41 AM      Result Value Ref Range   Glucose-Capillary 137 (*) 70 - 99 mg/dL   Comment 1 Notify RN    GLUCOSE, CAPILLARY     Status: Abnormal   Collection Time    02/11/14 11:46 AM      Result Value Ref Range   Glucose-Capillary 261  (*) 70 - 99 mg/dL   Comment 1 Notify RN    GLUCOSE, CAPILLARY     Status: Abnormal   Collection Time    02/11/14  4:55 PM      Result Value Ref Range   Glucose-Capillary 395 (*) 70 - 99 mg/dL   Comment 1 Notify RN    GLUCOSE, CAPILLARY     Status: Abnormal   Collection Time    02/11/14  8:05 PM      Result Value Ref Range   Glucose-Capillary 427 (*) 70 - 99 mg/dL  GLUCOSE, RANDOM     Status: Abnormal   Collection Time    02/11/14  9:50 PM      Result Value Ref Range   Glucose, Bld 440 (*) 70 - 99 mg/dL  GLUCOSE, CAPILLARY     Status: Abnormal   Collection Time    02/12/14 12:00 AM      Result Value Ref Range   Glucose-Capillary 367 (*) 70 - 99 mg/dL  GLUCOSE, CAPILLARY     Status: Abnormal   Collection Time    02/12/14  1:42 AM      Result Value Ref Range   Glucose-Capillary 331 (*) 70 - 99 mg/dL  GLUCOSE, CAPILLARY     Status: Abnormal   Collection Time    02/12/14  5:31 AM      Result Value Ref Range   Glucose-Capillary 202 (*) 70 - 99 mg/dL  GLUCOSE, CAPILLARY  Status: Abnormal   Collection Time    02/12/14  7:54 AM      Result Value Ref Range   Glucose-Capillary 155 (*) 70 - 99 mg/dL  GLUCOSE, CAPILLARY     Status: Abnormal   Collection Time    02/12/14 11:33 AM      Result Value Ref Range   Glucose-Capillary 163 (*) 70 - 99 mg/dL  GLUCOSE, CAPILLARY     Status: Abnormal   Collection Time    02/12/14  4:45 PM      Result Value Ref Range   Glucose-Capillary 188 (*) 70 - 99 mg/dL  GLUCOSE, CAPILLARY     Status: Abnormal   Collection Time    02/12/14  8:06 PM      Result Value Ref Range   Glucose-Capillary 226 (*) 70 - 99 mg/dL  GLUCOSE, CAPILLARY     Status: Abnormal   Collection Time    02/13/14 12:17 AM      Result Value Ref Range   Glucose-Capillary 183 (*) 70 - 99 mg/dL   Comment 1 Notify RN    GLUCOSE, CAPILLARY     Status: Abnormal   Collection Time    02/13/14  4:07 AM      Result Value Ref Range   Glucose-Capillary 141 (*) 70 - 99 mg/dL   COMPREHENSIVE METABOLIC PANEL     Status: Abnormal   Collection Time    02/13/14  6:55 AM      Result Value Ref Range   Sodium 141  137 - 147 mEq/L   Potassium 4.1  3.7 - 5.3 mEq/L   Chloride 104  96 - 112 mEq/L   CO2 26  19 - 32 mEq/L   Glucose, Bld 136 (*) 70 - 99 mg/dL   BUN 54 (*) 6 - 23 mg/dL   Creatinine, Ser 4.38 (*) 0.50 - 1.10 mg/dL   Calcium 8.9  8.4 - 10.5 mg/dL   Total Protein 6.2  6.0 - 8.3 g/dL   Albumin 2.3 (*) 3.5 - 5.2 g/dL   AST 21  0 - 37 U/L   ALT 29  0 - 35 U/L   Alkaline Phosphatase 80  39 - 117 U/L   Total Bilirubin 0.4  0.3 - 1.2 mg/dL   GFR calc non Af Amer 9 (*) >90 mL/min   GFR calc Af Amer 10 (*) >90 mL/min   Comment: (NOTE)     The eGFR has been calculated using the CKD EPI equation.     This calculation has not been validated in all clinical situations.     eGFR's persistently <90 mL/min signify possible Chronic Kidney     Disease.  GLUCOSE, CAPILLARY     Status: Abnormal   Collection Time    02/13/14  7:59 AM      Result Value Ref Range   Glucose-Capillary 125 (*) 70 - 99 mg/dL  GLUCOSE, CAPILLARY     Status: Abnormal   Collection Time    02/13/14 11:43 AM      Result Value Ref Range   Glucose-Capillary 281 (*) 70 - 99 mg/dL  GLUCOSE, CAPILLARY     Status: Abnormal   Collection Time    02/13/14  4:44 PM      Result Value Ref Range   Glucose-Capillary 254 (*) 70 - 99 mg/dL  GLUCOSE, CAPILLARY     Status: Abnormal   Collection Time    02/13/14  8:08 PM      Result Value Ref  Range   Glucose-Capillary 174 (*) 70 - 99 mg/dL  GLUCOSE, CAPILLARY     Status: Abnormal   Collection Time    02/13/14 11:56 PM      Result Value Ref Range   Glucose-Capillary 124 (*) 70 - 99 mg/dL  GLUCOSE, CAPILLARY     Status: None   Collection Time    02/14/14  3:53 AM      Result Value Ref Range   Glucose-Capillary 83  70 - 99 mg/dL      Nursing note and vitals reviewed.  Constitutional: She appears well-developed and well-nourished.  HENT:  Head:  Normocephalic and atraumatic.  Eyes: Conjunctivae are normal. Pupils are equal, round, and reactive to light.  Neck: Normal range of motion. Neck supple.  Cardiovascular: Normal rate and regular rhythm.  No murmur heard.  Respiratory: Effort normal and breath sounds normal. No respiratory distress. She has no wheezes.  GI: Soft. Bowel sounds are normal. She exhibits no distension.  Musculoskeletal: She exhibits no tenderness.  Neurological: She is alert.  Oriented to self and place. Was able to ID family members and state DOB but not age. Unable to follow two step commands. Impulsive and needs occasional redirection. RUE inattention.  Skin: Skin is warm and dry.  needs cues for limited orientation  Motor strength 4/5 in bilateral delt bi tri grip HF KE ADF  Sensory is difficult to assess secondary to cognition but does correctly identify light touch on both feet  No ataxia noted FNF   Assessment/Plan: 1. Functional deficits secondary to bilateral cerebral watershed infarcts which require 3+ hours per day of interdisciplinary therapy in a comprehensive inpatient rehab setting. Physiatrist is providing close team supervision and 24 hour management of active medical problems listed below. Physiatrist and rehab team continue to assess barriers to discharge/monitor patient progress toward functional and medical goals. FIM: FIM - Bathing Bathing Steps Patient Completed: Chest;Right Arm;Left Arm;Abdomen;Front perineal area;Buttocks;Right upper leg;Left upper leg Bathing: 4: Min-Patient completes 8-9 29f 10 parts or 75+ percent  FIM - Upper Body Dressing/Undressing Upper body dressing/undressing steps patient completed: Thread/unthread right sleeve of pullover shirt/dresss;Thread/unthread left sleeve of pullover shirt/dress;Pull shirt over trunk Upper body dressing/undressing: 4: Min-Patient completed 75 plus % of tasks FIM - Lower Body Dressing/Undressing Lower body dressing/undressing steps  patient completed: Thread/unthread right underwear leg;Thread/unthread left underwear leg;Pull underwear up/down;Thread/unthread left pants leg;Pull pants up/down;Don/Doff right sock;Don/Doff left sock Lower body dressing/undressing: 4: Min-Patient completed 75 plus % of tasks  FIM - Toileting Toileting steps completed by patient: Adjust clothing prior to toileting;Adjust clothing after toileting;Performs perineal hygiene Toileting Assistive Devices: Grab bar or rail for support Toileting: 4: Steadying assist  FIM - Diplomatic Services operational officer Devices: Grab bars Toilet Transfers: 4-To toilet/BSC: Min A (steadying Pt. > 75%);4-From toilet/BSC: Min A (steadying Pt. > 75%)  FIM - Bed/Chair Transfer Bed/Chair Transfer Assistive Devices: Arm rests Bed/Chair Transfer: 4: Bed > Chair or W/C: Min A (steadying Pt. > 75%);4: Chair or W/C > Bed: Min A (steadying Pt. > 75%)  FIM - Locomotion: Wheelchair Distance: 35 Locomotion: Wheelchair: 1: Total Assistance/staff pushes wheelchair (Pt<25%) FIM - Locomotion: Ambulation Locomotion: Ambulation Assistive Devices: Designer, industrial/product Ambulation/Gait Assistance: 4: Min assist Locomotion: Ambulation: 0: Activity did not occur  Comprehension Comprehension Mode: Auditory Comprehension: 2-Understands basic 25 - 49% of the time/requires cueing 51 - 75% of the time  Expression Expression Mode: Verbal Expression: 2-Expresses basic 25 - 49% of the time/requires cueing 50 - 75% of the  time. Uses single words/gestures.  Social Interaction Social Interaction: 3-Interacts appropriately 50 - 74% of the time - May be physically or verbally inappropriate.  Problem Solving Problem Solving: 1-Solves basic less than 25% of the time - needs direction nearly all the time or does not effectively solve problems and may need a restraint for safety  Memory Memory: 1-Recognizes or recalls less than 25% of the time/requires cueing greater than 75% of the  time  Medical Problem List and Plan:  1. Functional deficits secondary to Bilateral cerebral watershed infarcts related to acute anemia from GIB  2. DVT Prophylaxis/Anticoagulation: Mechanical: Sequential compression devices, below knee Bilateral lower extremities  3. Pain Management: Tylenol prn for pain.  4. Mood: No signs of distress. LCSW to follow with patient and family for evaluation and support.  5. Neuropsych: This patient is not capable of making decisions on her own behalf.  6. GIB due to erosive gatritis: Increase PPI to bid as at home.  7. HTN: Will monitor with tid checks. Continue metoprolol and Imdur.  8. Acute on CRF: Baseline Cr 1.2-1.7. No improvement of renal failure thus far 9. ABLA: Will continue to monitor H/H 3 x week. Is slowly improving. Will add iron supplement due to h/o iron deficiency.  10 DM type 2: Will resume Lantus insulin at dose of 10 units/HS. Monitor BS with ac/hs checks and titrate medications as indicated. Use SSI for elevated BS.  11. Chronic diastolic CHF: Check daily weights and monitor for signs of overload. Low salt diet.  12 NSTEMI due to demand ischemia: treated medically with Imdur, metoprolol and Lipitor. No ASA due to GIB.  13. Mild dementia with sundowning (was functional PTA): Nursing on acute reports insomnia with restlessness/agitation at night but not documented in chart.  RN notes cloudy urine, will check UA, this may be contributing to MS issues LOS (Days) 4 A FACE TO FACE EVALUATION WAS PERFORMED  Charlett Blake 02/14/2014, 7:27 AM

## 2014-02-14 NOTE — Progress Notes (Addendum)
Physical Therapy Session Note  Patient Details  Name: [name] MRN: [id number] Date of Birth: 07/16/1933  Today's Date: 02/14/2014 Time: 6734-1937 Time Calculation (min): 49 min  Short Term Goals: Week 1:  PT Short Term Goal 1 (Week 1): Pt will transfer supine to edge of bed, edge of bed to supine with min A.  PT Short Term Goal 2 (Week 1): Pt will transfer bed to chair, chair to bed with rolling walker and min A.  PT Short Term Goal 3 (Week 1): Pt will ambulate with rolling walker and min A about 50 feet,  PT Short Term Goal 4 (Week 1): Pt will ascend/descend 2 stiars with B rails and min A.  PT Short Term Goal 5 (Week 1): Pt will propel w/c 50 feet with min A.   Skilled Therapeutic Interventions/Progress Updates:   Pt received seated in w/c with son present. Pt agreeable to therapy. Pt not verbalizing significantly less during current session; therefore, unable to fully assess orientation. Upon performing sit<>stand from w/c using rolling walker, pants noted to be soiled secondary to bladder incontinence. Pt unaware of soiled clothing. Gait 2x12' (w/c<>bathroom) in controlled environment with rolling walker and min guard-min A, verbal cueing for obstacle negotiation, safety awareness (focus on refraining from holding onto nearby furniture). Performed toilet transfer with rolling walker and min A, mod verbal cueing for safety awareness. Multiple sit<>stand transfers from toilet then bed with rolling walker requiring min guard-min A. During hygiene and lower body dressing, pt performed dynamic standing balance with LUE support requiring close supervision to min A.  Son expressing concern regarding recent onset of urinary incontinence. Educated son on how pt deficits (expressive aphasia and decreased initiation) are likely contributing to incontinent episodes. RN notified of bladder incontinence during past 2 PT sessions. Discussed use of brief, timed toileting to facilitate continence. Session  ended in pt room, where pt was left seated in w/c with quick release belt on for safety, son present, and all needs within reach.  Addendum: Long term goal addressing stair negotiation modified secondary to updated report (per son) of single small step, then landing, then additional small step to enter home.  Therapy Documentation Precautions:  Precautions Precautions: Fall Precaution Comments: right field cut Restrictions Weight Bearing Restrictions: No Vital Signs: Therapy Vitals Pulse Rate: 66 Oxygen Therapy SpO2: 99 % O2 Device: None (Room air) Pulse Oximetry Type: Intermittent Pain: Pain Assessment Pain Assessment: Faces Faces Pain Scale: Hurts a little bit Pain Type: Acute pain Pain Location: Head Pain Intervention(s): Repositioned;Emotional support;RN made aware  See FIM for current functional status  Therapy/Group: Individual Therapy  Malva Cogan Hobble 02/14/2014, 6:29 PM

## 2014-02-15 ENCOUNTER — Inpatient Hospital Stay (HOSPITAL_COMMUNITY): Payer: Self-pay | Admitting: Occupational Therapy

## 2014-02-15 ENCOUNTER — Inpatient Hospital Stay (HOSPITAL_COMMUNITY): Payer: Medicare Other | Admitting: Speech Pathology

## 2014-02-15 ENCOUNTER — Inpatient Hospital Stay (HOSPITAL_COMMUNITY): Payer: Medicare Other | Admitting: Occupational Therapy

## 2014-02-15 ENCOUNTER — Ambulatory Visit (HOSPITAL_COMMUNITY): Payer: Self-pay | Admitting: *Deleted

## 2014-02-15 DIAGNOSIS — I2510 Atherosclerotic heart disease of native coronary artery without angina pectoris: Secondary | ICD-10-CM

## 2014-02-15 DIAGNOSIS — I6350 Cerebral infarction due to unspecified occlusion or stenosis of unspecified cerebral artery: Secondary | ICD-10-CM

## 2014-02-15 DIAGNOSIS — I428 Other cardiomyopathies: Secondary | ICD-10-CM

## 2014-02-15 DIAGNOSIS — I214 Non-ST elevation (NSTEMI) myocardial infarction: Secondary | ICD-10-CM

## 2014-02-15 DIAGNOSIS — I6340 Cerebral infarction due to embolism of unspecified cerebral artery: Secondary | ICD-10-CM

## 2014-02-15 LAB — GLUCOSE, CAPILLARY
GLUCOSE-CAPILLARY: 100 mg/dL — AB (ref 70–99)
GLUCOSE-CAPILLARY: 192 mg/dL — AB (ref 70–99)
Glucose-Capillary: 79 mg/dL (ref 70–99)
Glucose-Capillary: 241 mg/dL — ABNORMAL HIGH (ref 70–99)
Glucose-Capillary: 298 mg/dL — ABNORMAL HIGH (ref 70–99)
Glucose-Capillary: 286 mg/dL — ABNORMAL HIGH (ref 70–99)

## 2014-02-15 LAB — COMPREHENSIVE METABOLIC PANEL
ALT: 21 U/L (ref 0–35)
AST: 22 U/L (ref 0–37)
Albumin: 2.2 g/dL — ABNORMAL LOW (ref 3.5–5.2)
Alkaline Phosphatase: 82 U/L (ref 39–117)
BUN: 46 mg/dL — ABNORMAL HIGH (ref 6–23)
CO2: 28 meq/L (ref 19–32)
CREATININE: 4.36 mg/dL — AB (ref 0.50–1.10)
Calcium: 8.7 mg/dL (ref 8.4–10.5)
Chloride: 107 mEq/L (ref 96–112)
GFR calc Af Amer: 10 mL/min — ABNORMAL LOW (ref >90)
GFR, EST NON AFRICAN AMERICAN: 9 mL/min — AB (ref >90)
Glucose, Bld: 95 mg/dL (ref 70–99)
Potassium: 4.3 mEq/L (ref 3.7–5.3)
Sodium: 145 mEq/L (ref 137–147)
Total Bilirubin: 0.5 mg/dL (ref 0.3–1.2)
Total Protein: 6.2 g/dL (ref 6.0–8.3)

## 2014-02-15 MED ORDER — CEPHALEXIN 250 MG PO CAPS
250.0000 mg | ORAL_CAPSULE | Freq: Three times a day (TID) | ORAL | Status: DC
  Administered 2014-02-15 – 2014-02-16 (×4): 250 mg via ORAL
  Filled 2014-02-15 (×7): qty 1

## 2014-02-15 NOTE — Progress Notes (Signed)
Speech Language Pathology Daily Session Note  Patient Details  Name: [name] MRN: [id number] Date of Birth: [birth year]  Today's Date: 02/15/2014 Time: 4076-8088 Time Calculation (min): 45 min  Short Term Goals: Week 1: SLP Short Term Goal 1 (Week 1): Patient will initiate response to biographical and immediate needs questions with minimal cues. SLP Short Term Goal 2 (Week 1): Patient will follow 1-step/basic level directions to complete functional tasks wtih 75% accuracy.  SLP Short Term Goal 3 (Week 1): Patient will sustain attention to task and during conversation for 2-3 minutes with moderate cues. SLP Short Term Goal 4 (Week 1): patient will consume least restrictive diet with minimal assistance and no ovet s/s aspiration  Skilled Therapeutic Interventions: Skilled treatment session focused on addressing dysphagia and cognitive-linguistic goals. SLP facilitated session functional task of self-feeding during breakfast.  SLP facilitated session with Mod cues to initiate tray set-up, as well as Min verbal and tactile cues to slow rate and reduce oral pocketing. Cough noted x1 during meal, suspect premature spillage of bolus material due to "slurping" hot coffee. Patient required Max cues for initiation of requests for help.  Following questions about needs patient was able to verbalize need to use the bathroom and was assisted there with Min physical assist.  Continue plan of care.   FIM:  Comprehension Comprehension Mode: Auditory Comprehension: 3-Understands basic 50 - 74% of the time/requires cueing 25 - 50%  of the time Expression Expression Mode: Verbal Expression: 2-Expresses basic 25 - 49% of the time/requires cueing 50 - 75% of the time. Uses single words/gestures. Social Interaction Social Interaction: 2-Interacts appropriately 25 - 49% of time - Needs frequent redirection. Problem Solving Problem Solving: 3-Solves basic 50 - 74% of the time/requires cueing 25 - 49% of the  time Memory Memory: 2-Recognizes or recalls 25 - 49% of the time/requires cueing 51 - 75% of the time FIM - Eating Eating Activity: 5: Needs verbal cues/supervision;4: Helper checks for pocketed food;4: Helper occasionally scoops food on utensil  Pain Pain Assessment Pain Assessment: No/denies pain  Therapy/Group: Individual Therapy  Carmelia Roller., [place]  Mora Appl 02/15/2014, 1:16 PM

## 2014-02-15 NOTE — Progress Notes (Signed)
Social Work Patient ID: [name], female   DOB: Dec 07, 1932, 80 y.o.   MRN: [id number] Met with pt, son and daughter to inform of team conference goals-supervision/min level and discharge 5/9.  Son is not pleased at all with this date. He feels she has just begun her therapy and has a UTI and is no where near ready to go home.  He plans to speak with MD and will ask therapy team to talk with him Also.  Unsure what families goals are-pt will need care at home.  Informed son the PCS paperwork will be faxed in but not begin when she goes home.  It may take few weeks To get the service started.  Discussed the main goal is family education with the caregivers, son wants to see how much progress pt can make and would like team to work on continence with Her.  Will ask MD to talk with son and also therapy team.  Son is aware of his right to appeal the discharge.

## 2014-02-15 NOTE — Progress Notes (Signed)
Occupational Therapy Session Note  Patient Details  Name: [name] MRN: [id number] Date of Birth: Nov 18, 1932  Today's Date: 02/15/2014 Time: 0930-1025 Time Calculation (min): 55 min  Short Term Goals: Week 1:  OT Short Term Goal 1 (Week 1): Pt. will compensated for right field cut by looking to right with 1 verba prompt OT Short Term Goal 2 (Week 1): Pt. will dress self with minimal assist OT Short Term Goal 3 (Week 1): Pt. will bathe self with minimal assist OT Short Term Goal 4 (Week 1): Pt. will transfer to toilet with SBA OT Short Term Goal 5 (Week 1): Pt. will transfer to bathtub with shower with minimal assist  Skilled Therapeutic Interventions/Progress Updates:  Patient resting in bed upon arrival with her son, [name], at her side.  Engaged in self care retraining to include shower, dress and groom tasks.  Focused session on activity tolerance, attention to right, walker safety, safe shower and bed transfers, functional mobility.  Patient ambulated to and from bathroom with RW, dressed EOB while seated and in stand, required assist to wash feet and donn gripper socks yet was able to remove gripper socks while seated in shower.  Patient answered 1 question without cues, one when given a choice of 2 options and spontaneously asked for something to drink 2 times.   Patient noted to have labored breathing with and without simple activity-RN notified with following Vitals: HR 68, BP 167/53, SPO2 99% room air.  RN reported to MD who will follow up.  Patient's son left the room during self care then was present as session was over.  Patient chose to lie down after session.  Therapy Documentation Precautions:  Precautions Precautions: Fall Precaution Comments: right field cut Restrictions Weight Bearing Restrictions: No Pain: No c/o pain ADL: See FIM for current functional status  Therapy/Group: Individual Therapy  Gaye Pollack 02/15/2014, 2:53 PM

## 2014-02-15 NOTE — Progress Notes (Signed)
Occupational Therapy Session Note  Patient Details  Name: [name] MRN: [id number] Date of Birth: 24-Aug-1933  Today's Date: 02/15/2014 Time: 8469-6295 Time Calculation (min): 30 min  Skilled Therapeutic Interventions/Progress Updates:    Began session by working on toilet transfer and toileting from the wheelchair to the elevated toilet seat.  Pt needed min assist for transfer but then required mod assist for clothing management to push her brief down.  She was able to perform her toilet hygiene with min assist standing but again needed mod facilitation to pull up her pants.  Pt attempted to transfer back to the wheelchair prior to pulling her pants up so therapist assisted and then once in the chair she needed to stand once more to pull them up to where they needed to be.  Pt with limited verbal conversation did state that her head was hurting so nursing notified and medications brought by nursing.  She performed standing with min assist again at the sink to wash her hands.  Demonstrates flexed trunk in standing and decreased initiation.  Finished session by having pt stand X 2 in the dayroom while working on watering the plants.  Min assist and max instructional cueing for each interval of standing.   Therapy Documentation Precautions:  Precautions Precautions: Fall Precaution Comments: right field cut Restrictions Weight Bearing Restrictions: No  Pain: Pain Assessment Pain Assessment: No/denies pain Faces Pain Scale: No hurt Pain Type: Acute pain Pain Location: Head Pain Descriptors / Indicators: Headache Patients Stated Pain Goal: 3 Pain Intervention(s): Medication (See eMAR) ADL: See FIM for current functional status  Therapy/Group: Individual Therapy  Cindra Presume  OTR/L 02/15/2014, 3:41 PM

## 2014-02-15 NOTE — Progress Notes (Signed)
Social Work Elease Hashimoto, LCSW Social Worker Signed  Patient Care Conference Service date: 02/15/2014 1:58 PM  Inpatient RehabilitationTeam Conference and Plan of Care Update Date: 02/15/2014   Time: 10;45 Am     Patient Name: [name]       Medical Record Number: [id number]   Date of Birth: 07-05-1933 Sex: Female         Room/Bed: 4W10C/4W10C-01 Payor Info: Payor: MEDICARE / Plan: MEDICARE PART A AND B / Product Type: *No Product type* /   Admitting Diagnosis: CVA   Admit Date/Time:  02/10/2014  3:37 PM Admission Comments: No comment available   Primary Diagnosis:  <principal problem not specified> Principal Problem: <principal problem not specified>    Patient Active Problem List     Diagnosis  Date Noted   .  CVA (cerebral infarction)  02/10/2014   .  Acute embolic stroke  16/60/6301   .  Erosive gastritis with hemorrhage  02/08/2014   .  Acute bilat watershed infarction vs. embolic strokes  60/07/9322   .  Chronic diastolic CHF EF 55%  73/22/0254   .  Non-ST elevation MI (NSTEMI)  02/04/2014   .  Renal failure, acute  02/04/2014   .  NSTEMI (non-ST elevated myocardial infarction)  02/04/2014   .  Nonischemic cardiomyopathy  12/12/2013   .  ARF (acute renal failure)  12/05/2013   .  Memory difficulties  12/04/2013   .  Pulmonary edema  12/03/2013   .  Acute diastolic CHF (congestive heart failure)  12/03/2013   .  URI (upper respiratory infection)  03/02/2012   .  Irregular heart rhythm  02/26/2012   .  Ventricular bigeminy  02/26/2012   .  GI bleed  08/24/2011   .  CAD (coronary artery disease)     .  CEREBRAL EMBOLISM, WITH INFARCTION  07/02/2010   .  OSTEOPOROSIS  03/21/2009   .  DEGENERATIVE JOINT DISEASE  07/24/2008   .  INSOMNIA-SLEEP DISORDER-UNSPEC  07/24/2008   .  ANEMIA  01/06/2008   .  OBSTRUCTIVE SLEEP APNEA  01/06/2008   .  GASTROPARESIS, DIABETIC  08/03/2006   .  HYPERLIPIDEMIA  08/03/2006   .  PERIPHERAL NEUROPATHY  08/03/2006   .  HYPERTENSION   08/03/2006   .  Chronic diastolic congestive heart failure  08/03/2006   .  REACTIVE AIRWAY DISEASE  08/03/2006   .  GERD  08/03/2006   .  DM (diabetes mellitus), type 2, uncontrolled  11/04/1983     Expected Discharge Date: Expected Discharge Date: 02/18/14  Team Members Present: Physician leading conference: Dr. Alysia Penna Social Worker Present: Ovidio Kin, LCSW Nurse Present: Elliot Cousin, RN PT Present: Billie Ruddy, PT;Caroline Lacinda Axon, PT OT Present: Willeen Cass, OT SLP Present: Gunnar Fusi, SLP PPS Coordinator present : Ileana Ladd, PT        Current Status/Progress  Goal  Weekly Team Focus   Medical     Somnolent during the morning, a phasic, cognitive deficits, UTI  Identify and treat reversible causes of cognitive deficits  Initiate X. and monitor for cognitive improvement   Bowel/Bladder     Continent of bowel and bladder with timed toileting to bathroom. LBM 02/13/14 strict I/O's  cont Bowel and bladder  cont to monitor bowel and bladder q shift   Swallow/Nutrition/ Hydration     regular and thin with full supervision   impacted by cognition   family education    ADL's  min assist basic self care skills  supervision  balance, right sided attention   Mobility     Min A overall  Supervision overall  Activity tolerance, safety awareness, hands-on family training/education, R NMR, transfers, gait, obstacle negotiation on R side during mobility   Communication     Max assist   Mod-Max assist   increase initiation    Safety/Cognition/ Behavioral Observations    Max assist   Max assist   increase initiation and family education    Pain     no c/o pain  pain less than or equal to 2  monitor pain q shift   Skin     no skin break down   no new skin breakdown this admission  cont to monitor pain q shift    Rehab Goals Patient on target to meet rehab goals: Yes *See Care Plan and progress notes for long and short-term goals.    Barriers to Discharge:   Cognitive deficits      Possible Resolutions to Barriers:    Repetitive training      Discharge Planning/Teaching Needs:    SOn wants to take pt home and mange from there.  He is here daily and observing pt in therapies.  Arranging PCS services and will place on [facility] waiting list.      Team Discussion:    Treating for UTI-bad day yesterday.  Son here daily and observing.  Right sided inattention and cognitive issues-interfere with progress.  Will have good days and bad days.     Revisions to Treatment Plan:    None    Continued Need for Acute Rehabilitation Level of Care: The patient requires daily medical management by a physician with specialized training in physical medicine and rehabilitation for the following conditions: Daily direction of a multidisciplinary physical rehabilitation program to ensure safe treatment while eliciting the highest outcome that is of practical value to the patient.: Yes Daily medical management of patient stability for increased activity during participation in an intensive rehabilitation regime.: Yes Daily analysis of laboratory values and/or radiology reports with any subsequent need for medication adjustment of medical intervention for : Neurological problems;Other  [name] 02/15/2014, 1:59 PM          Patient ID: [name], female   DOB: 1933-01-16, 80 y.o.   MRN: [id number]

## 2014-02-15 NOTE — Progress Notes (Signed)
80 y.o. female with h/o CAD, DM type 2 with gastroparesis, CHF exacerbation 11/2103, GIB, early dementia who has had recent increase in falls with melena. She was admitted on 04/25 /15 with expressive aphasia, lethargy as well as twisted lip. She was found to be in acute renal failure and anemic with hgb-5.6. She was treated with IVF and transfused with 2 unit PRBC. Patient with elevated cardiac enzyme that was felt to be multifactorial due to demand ischemia. 2 D echo with EF 40-45%. Dr. Jonnie Finner consulted for input on acute on chronic renal failure and recommended d/c ace as well as hydration as patient poor HD candidate. Patient continued to be confused with fluctuating mentation and MRI brain done revealing multiple bilateral watershed infarcts. Carotid dopplers without ICA stenosis. Neurology recommended ASA if GIB resolved. Dr. Carlean Purl consulted for input and EGD done on 02/08/14 Revealing erosive gastritis in gastric antrum. She's cleared to use low dose ASA in two weeks if needed. Neurology feels that patient with embolic infarcts due to A Fib and Eliquis 2.5 mg bid recommended if CrCL >30.   Subjective/Complaints: Pt sleeping but arouses to voice  Review of Systems - cannot obtain secondary to mental status  Objective: Vital Signs: Blood pressure 161/72, pulse 64, temperature 98 F (36.7 C), temperature source Oral, resp. rate 18, height _0  (1.575 m), weight 82.4 kg (181 lb 10.5 oz), last menstrual period 12/19/1968, SpO2 100.00%. No results found. Results for orders placed during the hospital encounter of 02/10/14 (from the past 72 hour(s))  GLUCOSE, CAPILLARY     Status: Abnormal   Collection Time    02/12/14  7:54 AM      Result Value Ref Range   Glucose-Capillary 155 (*) 70 - 99 mg/dL  GLUCOSE, CAPILLARY     Status: Abnormal   Collection Time    02/12/14 11:33 AM      Result Value Ref Range   Glucose-Capillary 163 (*) 70 - 99 mg/dL  GLUCOSE, CAPILLARY     Status: Abnormal    Collection Time    02/12/14  4:45 PM      Result Value Ref Range   Glucose-Capillary 188 (*) 70 - 99 mg/dL  GLUCOSE, CAPILLARY     Status: Abnormal   Collection Time    02/12/14  8:06 PM      Result Value Ref Range   Glucose-Capillary 226 (*) 70 - 99 mg/dL  GLUCOSE, CAPILLARY     Status: Abnormal   Collection Time    02/13/14 12:17 AM      Result Value Ref Range   Glucose-Capillary 183 (*) 70 - 99 mg/dL   Comment 1 Notify RN    GLUCOSE, CAPILLARY     Status: Abnormal   Collection Time    02/13/14  4:07 AM      Result Value Ref Range   Glucose-Capillary 141 (*) 70 - 99 mg/dL  COMPREHENSIVE METABOLIC PANEL     Status: Abnormal   Collection Time    02/13/14  6:55 AM      Result Value Ref Range   Sodium 141  137 - 147 mEq/L   Potassium 4.1  3.7 - 5.3 mEq/L   Chloride 104  96 - 112 mEq/L   CO2 26  19 - 32 mEq/L   Glucose, Bld 136 (*) 70 - 99 mg/dL   BUN 54 (*) 6 - 23 mg/dL   Creatinine, Ser 4.38 (*) 0.50 - 1.10 mg/dL   Calcium 8.9  8.4 - 10.5  mg/dL   Total Protein 6.2  6.0 - 8.3 g/dL   Albumin 2.3 (*) 3.5 - 5.2 g/dL   AST 21  0 - 37 U/L   ALT 29  0 - 35 U/L   Alkaline Phosphatase 80  39 - 117 U/L   Total Bilirubin 0.4  0.3 - 1.2 mg/dL   GFR calc non Af Amer 9 (*) >90 mL/min   GFR calc Af Amer 10 (*) >90 mL/min   Comment: (NOTE)     The eGFR has been calculated using the CKD EPI equation.     This calculation has not been validated in all clinical situations.     eGFR's persistently <90 mL/min signify possible Chronic Kidney     Disease.  GLUCOSE, CAPILLARY     Status: Abnormal   Collection Time    02/13/14  7:59 AM      Result Value Ref Range   Glucose-Capillary 125 (*) 70 - 99 mg/dL  GLUCOSE, CAPILLARY     Status: Abnormal   Collection Time    02/13/14 11:43 AM      Result Value Ref Range   Glucose-Capillary 281 (*) 70 - 99 mg/dL  GLUCOSE, CAPILLARY     Status: Abnormal   Collection Time    02/13/14  4:44 PM      Result Value Ref Range   Glucose-Capillary 254  (*) 70 - 99 mg/dL  GLUCOSE, CAPILLARY     Status: Abnormal   Collection Time    02/13/14  8:08 PM      Result Value Ref Range   Glucose-Capillary 174 (*) 70 - 99 mg/dL  GLUCOSE, CAPILLARY     Status: Abnormal   Collection Time    02/13/14 11:56 PM      Result Value Ref Range   Glucose-Capillary 124 (*) 70 - 99 mg/dL  GLUCOSE, CAPILLARY     Status: None   Collection Time    02/14/14  3:53 AM      Result Value Ref Range   Glucose-Capillary 83  70 - 99 mg/dL  GLUCOSE, CAPILLARY     Status: None   Collection Time    02/14/14  8:25 AM      Result Value Ref Range   Glucose-Capillary 76  70 - 99 mg/dL  GLUCOSE, CAPILLARY     Status: Abnormal   Collection Time    02/14/14 12:03 PM      Result Value Ref Range   Glucose-Capillary 163 (*) 70 - 99 mg/dL  URINALYSIS, ROUTINE W REFLEX MICROSCOPIC     Status: Abnormal   Collection Time    02/14/14  1:35 PM      Result Value Ref Range   Color, Urine YELLOW  YELLOW   APPearance CLOUDY (*) CLEAR   Specific Gravity, Urine 1.011  1.005 - 1.030   pH 7.5  5.0 - 8.0   Glucose, UA NEGATIVE  NEGATIVE mg/dL   Hgb urine dipstick SMALL (*) NEGATIVE   Bilirubin Urine NEGATIVE  NEGATIVE   Ketones, ur NEGATIVE  NEGATIVE mg/dL   Protein, ur 100 (*) NEGATIVE mg/dL   Urobilinogen, UA 0.2  0.0 - 1.0 mg/dL   Nitrite NEGATIVE  NEGATIVE   Leukocytes, UA LARGE (*) NEGATIVE  URINE MICROSCOPIC-ADD ON     Status: Abnormal   Collection Time    02/14/14  1:35 PM      Result Value Ref Range   Squamous Epithelial / LPF RARE  RARE   WBC, UA TOO NUMEROUS  TO COUNT  <3 WBC/hpf   RBC / HPF 3-6  <3 RBC/hpf   Bacteria, UA FEW (*) RARE  GLUCOSE, CAPILLARY     Status: Abnormal   Collection Time    02/14/14  4:42 PM      Result Value Ref Range   Glucose-Capillary 270 (*) 70 - 99 mg/dL   Comment 1 Notify RN    GLUCOSE, CAPILLARY     Status: Abnormal   Collection Time    02/14/14  7:57 PM      Result Value Ref Range   Glucose-Capillary 237 (*) 70 - 99 mg/dL   GLUCOSE, CAPILLARY     Status: Abnormal   Collection Time    02/14/14 11:58 PM      Result Value Ref Range   Glucose-Capillary 195 (*) 70 - 99 mg/dL  GLUCOSE, CAPILLARY     Status: Abnormal   Collection Time    02/15/14  4:09 AM      Result Value Ref Range   Glucose-Capillary 100 (*) 70 - 99 mg/dL  COMPREHENSIVE METABOLIC PANEL     Status: Abnormal   Collection Time    02/15/14  5:15 AM      Result Value Ref Range   Sodium 145  137 - 147 mEq/L   Potassium 4.3  3.7 - 5.3 mEq/L   Chloride 107  96 - 112 mEq/L   CO2 28  19 - 32 mEq/L   Glucose, Bld 95  70 - 99 mg/dL   BUN 46 (*) 6 - 23 mg/dL   Creatinine, Ser 1.00 (*) 0.50 - 1.10 mg/dL   Calcium 8.7  8.4 - 34.9 mg/dL   Total Protein 6.2  6.0 - 8.3 g/dL   Albumin 2.2 (*) 3.5 - 5.2 g/dL   AST 22  0 - 37 U/L   ALT 21  0 - 35 U/L   Alkaline Phosphatase 82  39 - 117 U/L   Total Bilirubin 0.5  0.3 - 1.2 mg/dL   GFR calc non Af Amer 9 (*) >90 mL/min   GFR calc Af Amer 10 (*) >90 mL/min   Comment: (NOTE)     The eGFR has been calculated using the CKD EPI equation.     This calculation has not been validated in all clinical situations.     eGFR's persistently <90 mL/min signify possible Chronic Kidney     Disease.      Nursing note and vitals reviewed.  Constitutional: She appears well-developed and well-nourished.  HENT:  Head: Normocephalic and atraumatic.  Eyes: Conjunctivae are normal. Pupils are equal, round, and reactive to light.  Neck: Normal range of motion. Neck supple.  Cardiovascular: Normal rate and regular rhythm.  No murmur heard.  Respiratory: Effort normal and breath sounds normal. No respiratory distress. She has no wheezes.  GI: Soft. Bowel sounds are normal. She exhibits no distension.  Musculoskeletal: She exhibits no tenderness.  Neurological: She is alert.  Oriented to self and place. Was able to ID family members and state DOB but not age. Unable to follow two step commands. Impulsive and needs  occasional redirection. RUE inattention.  Skin: Skin is warm and dry.  needs cues for limited orientation  Motor strength 4/5 in bilateral delt bi tri grip HF KE ADF  Sensory is difficult to assess secondary to cognition but does correctly identify light touch on both feet  No ataxia noted FNF   Assessment/Plan: 1. Functional deficits secondary to bilateral cerebral watershed infarcts which require  3+ hours per day of interdisciplinary therapy in a comprehensive inpatient rehab setting. Physiatrist is providing close team supervision and 24 hour management of active medical problems listed below. Physiatrist and rehab team continue to assess barriers to discharge/monitor patient progress toward functional and medical goals. Team conference today please see physician documentation under team conference tab, met with team face-to-face to discuss problems,progress, and goals. Formulized individual treatment plan based on medical history, underlying problem and comorbidities. FIM: FIM - Bathing Bathing Steps Patient Completed: Chest;Right Arm;Left Arm;Abdomen;Front perineal area;Buttocks;Right upper leg;Left upper leg Bathing: 4: Min-Patient completes 8-9 33f10 parts or 75+ percent  FIM - Upper Body Dressing/Undressing Upper body dressing/undressing steps patient completed: Thread/unthread right sleeve of pullover shirt/dresss;Thread/unthread left sleeve of pullover shirt/dress;Pull shirt over trunk;Thread/unthread right sleeve of front closure shirt/dress;Thread/unthread left sleeve of front closure shirt/dress;Pull shirt around back of front closure shirt/dress Upper body dressing/undressing: 4: Min-Patient completed 75 plus % of tasks FIM - Lower Body Dressing/Undressing Lower body dressing/undressing steps patient completed: Thread/unthread right underwear leg;Thread/unthread left underwear leg;Pull underwear up/down;Thread/unthread left pants leg;Pull pants up/down;Don/Doff right sock;Don/Doff  left sock Lower body dressing/undressing: 4: Min-Patient completed 75 plus % of tasks  FIM - Toileting Toileting steps completed by patient: Adjust clothing prior to toileting;Adjust clothing after toileting;Performs perineal hygiene Toileting Assistive Devices: Grab bar or rail for support Toileting: 4: Steadying assist  FIM - TRadio producerDevices: Grab bars;Walker Toilet Transfers: 4-To toilet/BSC: Min A (steadying Pt. > 75%);4-From toilet/BSC: Min A (steadying Pt. > 75%)  FIM - Bed/Chair Transfer Bed/Chair Transfer Assistive Devices: Arm rests Bed/Chair Transfer: 4: Chair or W/C > Bed: Min A (steadying Pt. > 75%)  FIM - Locomotion: Wheelchair Distance: 35 Locomotion: Wheelchair: 0: Activity did not occur FIM - Locomotion: Ambulation Locomotion: Ambulation Assistive Devices: WAdministratorAmbulation/Gait Assistance: 4: Min assist Locomotion: Ambulation: 1: Travels less than 50 ft with minimal assistance (Pt.>75%)  Comprehension Comprehension Mode: Auditory Comprehension: 4-Understands basic 75 - 89% of the time/requires cueing 10 - 24% of the time  Expression Expression Mode: Verbal Expression: 2-Expresses basic 25 - 49% of the time/requires cueing 50 - 75% of the time. Uses single words/gestures.  Social Interaction Social Interaction: 2-Interacts appropriately 25 - 49% of time - Needs frequent redirection.  Problem Solving Problem Solving: 3-Solves basic 50 - 74% of the time/requires cueing 25 - 49% of the time  Memory Memory: 2-Recognizes or recalls 25 - 49% of the time/requires cueing 51 - 75% of the time  Medical Problem List and Plan:  1. Functional deficits secondary to Bilateral cerebral watershed infarcts related to acute anemia from GIB  2. DVT Prophylaxis/Anticoagulation: Mechanical: Sequential compression devices, below knee Bilateral lower extremities  3. Pain Management: Tylenol prn for pain.  4. Mood: No signs of  distress. LCSW to follow with patient and family for evaluation and support.  5. Neuropsych: This patient is not capable of making decisions on her own behalf.  6. GIB due to erosive gatritis: Increase PPI to bid as at home.  7. HTN: Will monitor with tid checks. Continue metoprolol and Imdur.  8. Acute on CRF: Baseline Cr 1.2-1.7. No improvement of renal failure thus far 9. ABLA: Will continue to monitor H/H 3 x week. Is slowly improving. Will add iron supplement due to h/o iron deficiency.  10 DM type 2: Will resume Lantus insulin at dose of 10 units/HS. Monitor BS with ac/hs checks and titrate medications as indicated. Use SSI for elevated BS.  11. Chronic diastolic CHF: Check daily weights and monitor for signs of overload. Low salt diet.  12 NSTEMI due to demand ischemia: treated medically with Imdur, metoprolol and Lipitor. No ASA due to GIB.  13. Mild dementia with sundowning (was functional PTA): Nursing on acute reports insomnia with restlessness/agitation at night but not documented in chart.   46.  Probable UTI  Start keflex     LOS (Days) 5 A FACE TO FACE EVALUATION WAS PERFORMED  Charlett Blake 02/15/2014, 7:52 AM

## 2014-02-15 NOTE — Progress Notes (Signed)
Physical Therapy Session Note  Patient Details  Name: [name] MRN: [id number] Date of Birth: May 02, 1933  Today's Date: 02/15/2014 Time: 8786-7672 Time Calculation (min): 53 min  Short Term Goals: Week 1:  PT Short Term Goal 1 (Week 1): Pt will transfer supine to edge of bed, edge of bed to supine with min A.  PT Short Term Goal 2 (Week 1): Pt will transfer bed to chair, chair to bed with rolling walker and min A.  PT Short Term Goal 3 (Week 1): Pt will ambulate with rolling walker and min A about 50 feet,  PT Short Term Goal 4 (Week 1): Pt will ascend/descend 2 stiars with B rails and min A.  PT Short Term Goal 5 (Week 1): Pt will propel w/c 50 feet with min A.   Skilled Therapeutic Interventions/Progress Updates:    Pt received seated in w/c with quick release belt on. No family present at this time. Oriented x2 to self and place with mod cueing for name of hospital. Performed gait x45' in controlled environment with rolling walker, close supervision. Gait trial ended due to pt-demonstrated fatigue (attempting to rest arms/head on rolling walker). Gait x30' in home environment with rolling walker and close supervision. Remainder of session focused on dynamic standing balance. See below for detailed description of NMR interventions. Multiple sit<>stand transfers from w/c with rolling walker with close supervision, safe hand placement, and proper set up.  Session ended in day room, where pt was left seated at table with RN present and pt in no apparent distress.  Long term goal addressing community w/c mobility discharged secondary to plan for pt's family to provide total A for w/c mobility in community secondary to R visual field cut. Added goal for w/c mobility in controlled environment. Modified distances for gait in controlled/home environments to reflect household ambulation distance (75'), which reflects family report of pt's PLOF. Added LTG for dynamic standing balance to address  postural stability in standing during functional activities/mobility.  Therapy Documentation Precautions:  Precautions Precautions: Fall Precaution Comments: right field cut Restrictions Weight Bearing Restrictions: No General: Amount of Missed PT Time (min): 7 Minutes Missed Time Reason: Patient fatigue Pain: Pain Assessment Pain Assessment: FLACC Pain Score: 4  Pain Type: Acute pain Pain Location: Leg Pain Orientation: Right Pain Descriptors / Indicators: Grimacing Pain Onset: Unable to tell Pain Intervention(s): RN made aware;Ambulation/increased activity Multiple Pain Sites: No NMR: Dynamic standing balance activity (watering/pruning plant) involving bilateral weight shifting, attending to R side of visual field with RUE support at table requiring supervision. Trial ended after 2 minutes, 50 seconds secondary to pt repositioning self into seated in w/c due to fatigue.  Dynamic bowling activity with focus on postural stability in standing with balance perturbations, coordination, attention to R side of body/R visual field. Pt utilized RUE to roll bowling ball with RUE requiring min A, L HHA for stability/balance. Pt consistently attended to R visual field while positioning bowling ball on R hand and gave effective within-session carryover of grip. Pt with decreased attention to R side of pins when aiming ball.  See FIM for current functional status  Therapy/Group: Individual Therapy  Malva Cogan Asma Boldon 02/15/2014, 8:09 PM

## 2014-02-15 NOTE — Patient Care Conference (Signed)
Inpatient RehabilitationTeam Conference and Plan of Care Update Date: 02/15/2014   Time: 10;45 Am    Patient Name: [name]      Medical Record Number: [id number]  Date of Birth: [birth year] Sex: Female         Room/Bed: 4W10C/4W10C-01 Payor Info: Payor: MEDICARE / Plan: MEDICARE PART A AND B / Product Type: *No Product type* /    Admitting Diagnosis: CVA   Admit Date/Time:  02/10/2014  3:37 PM Admission Comments: No comment available   Primary Diagnosis:  <principal problem not specified> Principal Problem: <principal problem not specified>  Patient Active Problem List   Diagnosis Date Noted  . CVA (cerebral infarction) 02/10/2014  . Acute embolic stroke 69/62/9528  . Erosive gastritis with hemorrhage 02/08/2014  . Acute bilat watershed infarction vs. embolic strokes 41/32/4401  . Chronic diastolic CHF EF 02% 72/53/6644  . Non-ST elevation MI (NSTEMI) 02/04/2014  . Renal failure, acute 02/04/2014  . NSTEMI (non-ST elevated myocardial infarction) 02/04/2014  . Nonischemic cardiomyopathy 12/12/2013  . ARF (acute renal failure) 12/05/2013  . Memory difficulties 12/04/2013  . Pulmonary edema 12/03/2013  . Acute diastolic CHF (congestive heart failure) 12/03/2013  . URI (upper respiratory infection) 03/02/2012  . Irregular heart rhythm 02/26/2012  . Ventricular bigeminy 02/26/2012  . GI bleed 08/24/2011  . CAD (coronary artery disease)   . CEREBRAL EMBOLISM, WITH INFARCTION 07/02/2010  . OSTEOPOROSIS 03/21/2009  . DEGENERATIVE JOINT DISEASE 07/24/2008  . INSOMNIA-SLEEP DISORDER-UNSPEC 07/24/2008  . ANEMIA 01/06/2008  . OBSTRUCTIVE SLEEP APNEA 01/06/2008  . GASTROPARESIS, DIABETIC 08/03/2006  . HYPERLIPIDEMIA 08/03/2006  . PERIPHERAL NEUROPATHY 08/03/2006  . HYPERTENSION 08/03/2006  . Chronic diastolic congestive heart failure 08/03/2006  . REACTIVE AIRWAY DISEASE 08/03/2006  . GERD 08/03/2006  . DM (diabetes mellitus), type 2, uncontrolled 11/04/1983    Expected  Discharge Date: Expected Discharge Date: 02/18/14  Team Members Present: Physician leading conference: Dr. Alysia Penna Social Worker Present: Ovidio Kin, LCSW Nurse Present: Elliot Cousin, RN PT Present: Billie Ruddy, PT;Caroline Lacinda Axon, PT OT Present: Willeen Cass, OT SLP Present: Gunnar Fusi, SLP PPS Coordinator present : Ileana Ladd, PT     Current Status/Progress Goal Weekly Team Focus  Medical   Somnolent during the morning, a phasic, cognitive deficits, UTI  Identify and treat reversible causes of cognitive deficits  Initiate X. and monitor for cognitive improvement   Bowel/Bladder   Continent of bowel and bladder with timed toileting to bathroom. LBM 02/13/14 strict I/O's  cont Bowel and bladder  cont to monitor bowel and bladder q shift   Swallow/Nutrition/ Hydration   regular and thin with full supervision   impacted by cognition   family education    ADL's   min assist basic self care skills  supervision  balance, right sided attention   Mobility   Min A overall  Supervision overall  Activity tolerance, safety awareness, hands-on family training/education, R NMR, transfers, gait, obstacle negotiation on R side during mobility   Communication   Max assist   Mod-Max assist   increase initiation    Safety/Cognition/ Behavioral Observations  Max assist   Max assist   increase initiation and family education    Pain   no c/o pain  pain less than or equal to 2  monitor pain q shift   Skin   no skin break down   no new skin breakdown this admission  cont to monitor pain q shift    Rehab Goals Patient on target to meet rehab goals:  Yes *See Care Plan and progress notes for long and short-term goals.  Barriers to Discharge: Cognitive deficits    Possible Resolutions to Barriers:  Repetitive training    Discharge Planning/Teaching Needs:  SOn wants to take pt home and mange from there.  He is here daily and observing pt in therapies.  Arranging PCS  services and will place on [facility] waiting list.      Team Discussion:  Treating for UTI-bad day yesterday.  Son here daily and observing.  Right sided inattention and cognitive issues-interfere with progress.  Will have good days and bad days.    Revisions to Treatment Plan:  None   Continued Need for Acute Rehabilitation Level of Care: The patient requires daily medical management by a physician with specialized training in physical medicine and rehabilitation for the following conditions: Daily direction of a multidisciplinary physical rehabilitation program to ensure safe treatment while eliciting the highest outcome that is of practical value to the patient.: Yes Daily medical management of patient stability for increased activity during participation in an intensive rehabilitation regime.: Yes Daily analysis of laboratory values and/or radiology reports with any subsequent need for medication adjustment of medical intervention for : Neurological problems;Other  Gardiner Rhyme Jensine Luz 02/15/2014, 1:59 PM

## 2014-02-15 NOTE — Progress Notes (Signed)
Recreational Therapy Session Note  Patient Details  Name: [name] MRN: [id number] Date of Birth: January 04, 1933 Today's Date: 02/15/2014  Pain: c/o RLE pain, RN aware, premedicated Skilled Therapeutic Interventions/Progress Updates: Session focused on activity tolerance, ambulation with RW, dynamic standing balance, visual scanning & attention.  Pt ambulated short distances with RW with min assist.  Pt fatigues quickly, needing frequent rest breaks.  Pt stood to water & "dead-head" plant at tabletop with supervision, min cues.  Pt also stood to bowl with min assist/hand held.  Pt smiling & laughing during session.  Waldon Reining 02/15/2014, 3:48 PM

## 2014-02-15 NOTE — Plan of Care (Signed)
Problem: RH Wheelchair Mobility Goal: LTG Patient will propel w/c in community environment (PT) LTG: Patient will propel wheelchair in community environment, # of feet with assist (PT)  Outcome: Not Applicable Date Met:  21/79/81 N/A, as family will be providing total A for w/c mobility in community.

## 2014-02-16 ENCOUNTER — Encounter (HOSPITAL_COMMUNITY): Payer: Self-pay | Admitting: Occupational Therapy

## 2014-02-16 ENCOUNTER — Inpatient Hospital Stay (HOSPITAL_COMMUNITY): Payer: Self-pay | Admitting: Physical Therapy

## 2014-02-16 ENCOUNTER — Ambulatory Visit (HOSPITAL_COMMUNITY): Payer: Self-pay | Admitting: *Deleted

## 2014-02-16 ENCOUNTER — Inpatient Hospital Stay (HOSPITAL_COMMUNITY): Payer: Medicare Other | Admitting: Speech Pathology

## 2014-02-16 LAB — GLUCOSE, CAPILLARY
GLUCOSE-CAPILLARY: 157 mg/dL — AB (ref 70–99)
GLUCOSE-CAPILLARY: 78 mg/dL (ref 70–99)
GLUCOSE-CAPILLARY: 109 mg/dL — AB (ref 70–99)
GLUCOSE-CAPILLARY: 108 mg/dL — AB (ref 70–99)
GLUCOSE-CAPILLARY: 237 mg/dL — AB (ref 70–99)
Glucose-Capillary: 224 mg/dL — ABNORMAL HIGH (ref 70–99)
Glucose-Capillary: 231 mg/dL — ABNORMAL HIGH (ref 70–99)

## 2014-02-16 LAB — CBC
HEMATOCRIT: 26.1 % — AB (ref 36.0–46.0)
Hemoglobin: 8.1 g/dL — ABNORMAL LOW (ref 12.0–15.0)
MCH: 22.3 pg — ABNORMAL LOW (ref 26.0–34.0)
MCHC: 31 g/dL (ref 30.0–36.0)
MCV: 71.9 fL — ABNORMAL LOW (ref 78.0–100.0)
PLATELETS: 367 10*3/uL (ref 150–400)
RBC: 3.63 MIL/uL — ABNORMAL LOW (ref 3.87–5.11)
RDW: 23 % — AB (ref 11.5–15.5)
WBC: 8.1 10*3/uL (ref 4.0–10.5)

## 2014-02-16 LAB — URINE CULTURE: Colony Count: >=100000

## 2014-02-16 MED ORDER — CIPROFLOXACIN HCL 250 MG PO TABS
250.0000 mg | ORAL_TABLET | Freq: Every day | ORAL | Status: DC
  Filled 2014-02-16: qty 1

## 2014-02-16 MED ORDER — INSULIN ASPART 100 UNIT/ML ~~LOC~~ SOLN
0.0000 [IU] | Freq: Three times a day (TID) | SUBCUTANEOUS | Status: DC
Start: 2014-02-16 — End: 2014-02-20
  Administered 2014-02-16 (×3): 3 [IU] via SUBCUTANEOUS
  Administered 2014-02-17: 2 [IU] via SUBCUTANEOUS
  Administered 2014-02-17 (×2): 3 [IU] via SUBCUTANEOUS
  Administered 2014-02-17: 1 [IU] via SUBCUTANEOUS
  Administered 2014-02-18: 2 [IU] via SUBCUTANEOUS
  Administered 2014-02-18: 7 [IU] via SUBCUTANEOUS
  Administered 2014-02-18: 1 [IU] via SUBCUTANEOUS
  Administered 2014-02-18: 2 [IU] via SUBCUTANEOUS
  Administered 2014-02-19: 7 [IU] via SUBCUTANEOUS
  Administered 2014-02-19: 2 [IU] via SUBCUTANEOUS
  Administered 2014-02-19: 3 [IU] via SUBCUTANEOUS
  Administered 2014-02-19: 9 [IU] via SUBCUTANEOUS
  Administered 2014-02-20: 1 [IU] via SUBCUTANEOUS

## 2014-02-16 MED ORDER — CIPROFLOXACIN HCL 250 MG PO TABS
250.0000 mg | ORAL_TABLET | Freq: Every day | ORAL | Status: DC
  Administered 2014-02-16 – 2014-02-20 (×5): 250 mg via ORAL
  Filled 2014-02-16 (×6): qty 1

## 2014-02-16 NOTE — Progress Notes (Signed)
Physical Therapy Session Note  Patient Details  Name: [name] MRN: [id number] Date of Birth: 02/10/1933  Today's Date: 02/16/2014 Time: 0930-1030 and 1330-1400 Time Calculation (min): 60 min and 30 min  Short Term Goals: Week 1:  PT Short Term Goal 1 (Week 1): Pt will transfer supine to edge of bed, edge of bed to supine with min A.  PT Short Term Goal 2 (Week 1): Pt will transfer bed to chair, chair to bed with rolling walker and min A.  PT Short Term Goal 3 (Week 1): Pt will ambulate with rolling walker and min A about 50 feet,  PT Short Term Goal 4 (Week 1): Pt will ascend/descend 2 stiars with B rails and min A.  PT Short Term Goal 5 (Week 1): Pt will propel w/c 50 feet with min A.   Skilled Therapeutic Interventions/Progress Updates:    Treatment Session 1: Pt received seated in w/c with son present; agreeable to therapy. Session focused on initiation of hands-on family training. Per pt request to use bathroom, performed toilet transfer using rolling walker and hand rails with close supervision. Pt continent of bowel and bladder; RN notified.Therapist explained, demonstrated technique for providing supervision, appropriate cueing during stand pivot transfers and gait with rolling walker. Pt performed sit<>stand from w/c with supervision of son, then gait x86 in controlled environment with rolling walker and supervision of son. Son appropriately cued pt to attend to task and to R side of visual field. PT explained, demonstrated cueing during w/c mobility x90' in controlled (but crowded) environment with min A, HOH assist for hand placement on w/c rims with inconsistent within-session carryover. Son provided appropriate cueing (for attention to task, R-sided obstacle negotiation) during w/c mobility.  During toileting and donning brief, this PT asked son to be present to learn how to assist pt upon D/C home. Son refused, stating, "My sister will do it." When asked for clarification about  D/C plan, son reported that sister will not be present 24/7 at D/C. PT educated pt on importance of learning to assist with toileting as son will often be only person present with pt. Son again refused, stating, "The personal care assistant will do it." CSW notified.  With pt present, discussed D/C plan with son. Conversation focused on rationale for length of stay, including PLOF and current functional status. PT explained that the purpose of inpatient rehab is to facilitate safe D/C home and that home health therapies will continue to progress pt after D/C home. Son verbalized understanding and was in full agreement. CSW notified of conversation. Therapist departed with pt seated in w/c with son in room and OT present for next session.  Treatment Session 2: Co-tx with rec therapist. Pt received in bed, asleep. Pt awakened easily but very lethargic throughout session. No family present for hands-on training/education. Therefore, session focused on negotiation of single step, short landing, then additional step to enter home. Pt required min A for supine>sit transfer secondary to decreased pt initiation. Sit<>stand and stand pivot transfer with rolling walker requiring supervision, increased time, cueing for safety awareness. Transported pt to gym, where pt negotiated single step without rails using rolling walker with min A and manual stabilization of walker. Pt required max encouragement to complete mobility task, as pt attempted to rest UE's and head on rolling walker after ascending step. Returned to pt room, where pt performed sit>supine with supervision. Pt required cueing and mod A to reposition self in bed secondary to decreased awareness of body  position. Therapists departed with pt left semi-reclined in bed with 3 bed rails up, bed alarm on, and all needs within reach.  Long term goal addressing bed mobility downgraded to min A secondary to assist required to initiate supine>sit.  Therapy  Documentation Precautions:  Precautions Precautions: Fall Precaution Comments: right field cut Restrictions Weight Bearing Restrictions: No Pain: Pain Assessment Pain Score: 0-No pain Locomotion : Ambulation Ambulation/Gait Assistance: 5: Supervision Wheelchair Mobility Distance: 90   See FIM for current functional status  Therapy/Group: Individual Therapy  Benjie Karvonen A Nel Stoneking 02/16/2014, 12:03 PM

## 2014-02-16 NOTE — Progress Notes (Addendum)
Occupational Therapy Session Note  Patient Details  Name: [name] MRN: [id number] Date of Birth: 10-13-1933  Today's Date: 02/16/2014 Time: 6384-5364 Time Calculation (min): 53 min  Short Term Goals: Week 1:  OT Short Term Goal 1 (Week 1): Pt. will compensated for right field cut by looking to right with 1 verba prompt OT Short Term Goal 2 (Week 1): Pt. will dress self with minimal assist OT Short Term Goal 3 (Week 1): Pt. will bathe self with minimal assist OT Short Term Goal 4 (Week 1): Pt. will transfer to toilet with SBA OT Short Term Goal 5 (Week 1): Pt. will transfer to bathtub with shower with minimal assist  Skilled Therapeutic Interventions/Progress Updates:    self care retraining at shower level with focus on functional ambulation with safe use of RW with min to mod instructional and demonstrative cuing, toilet and shower stall transfer with use of grab bar with steady A with more than reasonable amt of time, sit to stand, standing balance, sequencing, etc. Pt rewashed her chest and stomach 3-4 times, returning to these parts after she would wash another body part. Pt able to cross right Le over left to wash foot with min A but unable to cross left one due to discomfort. Pt able to dress from a pile of clothing with min cuing for orientation of clothing and steady A.  Pt demonstrated apraxia with brushing her teeth putting toothpaste in her mouth without using toothbrush requiring cuing and A to setup, and required cuing to terminate task. Min cuing in session for right attention.  Family member (son) didn't stay for session.   Therapy Documentation Precautions:  Precautions Precautions: Fall Precaution Comments: right field cut Restrictions Weight Bearing Restrictions: No    Pain: Pain Assessment Pain Score: 0-No pain  See FIM for current functional status  Therapy/Group: Individual Therapy  Merrilee Seashore 02/16/2014, 10:51 AM

## 2014-02-16 NOTE — Progress Notes (Signed)
Educated son on insulin and diabetes management with son stating "i know how to do that. I have been doing it for years." Reviewed medications with Son and daughter with no interest in learning meds. Son states, "I know how to manage medications and already know what they are for." Son refusing further education on DM and medications.

## 2014-02-16 NOTE — Progress Notes (Signed)
Recreational Therapy Discharge Summary Patient Details  Name: RAINN ZUPKO MRN: [id number] Date of Birth: July 15, 1933 Today's Date: 02/16/2014  Long term goals set: 1  Long term goals met: 1  Comments on progress toward goals: Pt has made good progress toward goal and is scheduled for discharge home with family to provide 24 hour supervision/assist.  Pt fatigues quickly requiring frequent rest breaks, requires extra time & verbal cues for task completion.  Pt is at [facility] assist level for simple-moderately complex TR task.   Reasons for discharge: discharge from hospital Patient/family agrees with progress made and goals achieved: Yes  Waldon Reining 02/16/2014, 4:30 PM

## 2014-02-16 NOTE — Progress Notes (Signed)
Social Work Patient ID: [name], female   DOB: 03-20-1933, 80 y.o.   MRN: [id number] Spoke with son and he did speak with therapy team and his sister spoke with MD rounding today regarding the concerns they have. Pt is having a better day and is getting close to the goals set by therapy team.  Both son and sister understand the goals and medically  Readiness of pt for Sat.  MD to re-check urine to see if antibiotic is working.  Aware if needed to change antibiotic would stay a few more days. Both daughter and son are comfortable now with the discharge date and goals.  Plan for discharge on Sat if medically stable and ready.

## 2014-02-16 NOTE — Progress Notes (Signed)
Social Work Patient ID: [name], female   DOB: 11/28/1932, 80 y.o.   MRN: [id number] Met with son to discuss pt's care at home and if he is comfortable helping with her personal care.  He feels this will be fall on his sister, but asked what if she is not there. He had no answer to this.  PT-Blair tried to engage him in therapies with pt and he did not participate.  Will try to discuss with pt's daughter when here and see if a female of the family will be helping with Her personal care and tolieting.

## 2014-02-16 NOTE — Progress Notes (Signed)
Speech Language Pathology Daily Session Note  Patient Details  Name: [name] MRN: [id number] Date of Birth: 1933/08/27  Today's Date: 02/16/2014 Time: 7867-6720 Time Calculation (min): 40 min  Short Term Goals: Week 1: SLP Short Term Goal 1 (Week 1): Patient will initiate response to biographical and immediate needs questions with minimal cues. SLP Short Term Goal 2 (Week 1): Patient will follow 1-step/basic level directions to complete functional tasks wtih 75% accuracy.  SLP Short Term Goal 3 (Week 1): Patient will sustain attention to task and during conversation for 2-3 minutes with moderate cues. SLP Short Term Goal 4 (Week 1): patient will consume least restrictive diet with minimal assistance and no ovet s/s aspiration  Skilled Therapeutic Interventions: Skilled treatment session focused on addressing dysphagia and cognitive goals, as well as family education. SLP facilitated session functional task of self-feeding during lunch.  SLP facilitated session with Mod cues to initiate tray set-up, faded to Min cues after initiation as well as Supervision verbal cues to slow rate and reduce oral pocketing. Patient exhibited no overt s/s of aspiration.  Following questions about needs patient with choice of two patient was able to verbalize that she wanted to sit up and watch TV.  SLP educated son and daughter throughout session on increased wait time, choice of two within the context of an activity for expression as well as initiation assist.  Wrap up family education tomorrow.   FIM:  Comprehension Comprehension Mode: Auditory Comprehension: 3-Understands basic 50 - 74% of the time/requires cueing 25 - 50%  of the time Expression Expression Mode: Verbal Expression: 3-Expresses basic 50 - 74% of the time/requires cueing 25 - 50% of the time. Needs to repeat parts of sentences. Social Interaction Social Interaction: 2-Interacts appropriately 25 - 49% of time - Needs frequent  redirection. Problem Solving Problem Solving: 3-Solves basic 50 - 74% of the time/requires cueing 25 - 49% of the time Memory Memory: 2-Recognizes or recalls 25 - 49% of the time/requires cueing 51 - 75% of the time  Pain Pain Assessment Pain Assessment: No/denies pain  Therapy/Group: Individual Therapy  Carmelia Roller., [place]  Mora Appl 02/16/2014, 1:18 PM

## 2014-02-16 NOTE — Progress Notes (Signed)
80 y.o. female with h/o CAD, DM type 2 with gastroparesis, CHF exacerbation 11/2103, GIB, early dementia who has had recent increase in falls with melena. She was admitted on 04/25 /15 with expressive aphasia, lethargy as well as twisted lip. She was found to be in acute renal failure and anemic with hgb-5.6. She was treated with IVF and transfused with 2 unit PRBC. Patient with elevated cardiac enzyme that was felt to be multifactorial due to demand ischemia. 2 D echo with EF 40-45%. Dr. Jonnie Finner consulted for input on acute on chronic renal failure and recommended d/c ace as well as hydration as patient poor HD candidate. Patient continued to be confused with fluctuating mentation and MRI brain done revealing multiple bilateral watershed infarcts. Carotid dopplers without ICA stenosis. Neurology recommended ASA if GIB resolved. Dr. Carlean Purl consulted for input and EGD done on 02/08/14 Revealing erosive gastritis in gastric antrum. She's cleared to use low dose ASA in two weeks if needed. Neurology feels that patient with embolic infarcts due to A Fib and Eliquis 2.5 mg bid recommended if CrCL >30.   Subjective/Complaints: Pt sleeping but arouses to voice Po caloric intake 10-100%,variable, fluid intake about 725m/24h Daughter in room, concerned about DC date Review of Systems - cannot obtain secondary to mental status  Objective: Vital Signs: Blood pressure 154/66, pulse 68, temperature 98.6 F (37 C), temperature source Oral, resp. rate 17, height _0  (1.575 m), weight 80.6 kg (177 lb 11.1 oz), last menstrual period 12/19/1968, SpO2 97.00%. No results found. Results for orders placed during the hospital encounter of 02/10/14 (from the past 72 hour(s))  GLUCOSE, CAPILLARY     Status: Abnormal   Collection Time    02/13/14 11:43 AM      Result Value Ref Range   Glucose-Capillary 281 (*) 70 - 99 mg/dL  GLUCOSE, CAPILLARY     Status: Abnormal   Collection Time    02/13/14  4:44 PM      Result  Value Ref Range   Glucose-Capillary 254 (*) 70 - 99 mg/dL  GLUCOSE, CAPILLARY     Status: Abnormal   Collection Time    02/13/14  8:08 PM      Result Value Ref Range   Glucose-Capillary 174 (*) 70 - 99 mg/dL  GLUCOSE, CAPILLARY     Status: Abnormal   Collection Time    02/13/14 11:56 PM      Result Value Ref Range   Glucose-Capillary 124 (*) 70 - 99 mg/dL  GLUCOSE, CAPILLARY     Status: None   Collection Time    02/14/14  3:53 AM      Result Value Ref Range   Glucose-Capillary 83  70 - 99 mg/dL  GLUCOSE, CAPILLARY     Status: None   Collection Time    02/14/14  8:25 AM      Result Value Ref Range   Glucose-Capillary 76  70 - 99 mg/dL  GLUCOSE, CAPILLARY     Status: Abnormal   Collection Time    02/14/14 12:03 PM      Result Value Ref Range   Glucose-Capillary 163 (*) 70 - 99 mg/dL  URINALYSIS, ROUTINE W REFLEX MICROSCOPIC     Status: Abnormal   Collection Time    02/14/14  1:35 PM      Result Value Ref Range   Color, Urine YELLOW  YELLOW   APPearance CLOUDY (*) CLEAR   Specific Gravity, Urine 1.011  1.005 - 1.030   pH 7.5  5.0 - 8.0   Glucose, UA NEGATIVE  NEGATIVE mg/dL   Hgb urine dipstick SMALL (*) NEGATIVE   Bilirubin Urine NEGATIVE  NEGATIVE   Ketones, ur NEGATIVE  NEGATIVE mg/dL   Protein, ur 100 (*) NEGATIVE mg/dL   Urobilinogen, UA 0.2  0.0 - 1.0 mg/dL   Nitrite NEGATIVE  NEGATIVE   Leukocytes, UA LARGE (*) NEGATIVE  URINE CULTURE     Status: None   Collection Time    02/14/14  1:35 PM      Result Value Ref Range   Specimen Description URINE, CLEAN CATCH     Special Requests NONE     Culture  Setup Time       Value: 02/14/2014 13:43     Performed at [facility]       Value: >=100,000 COLONIES/ML     Performed at [facility]   Culture       Value: ESCHERICHIA COLI     Performed at [facility]   Report Status PENDING    URINE MICROSCOPIC-ADD ON     Status: Abnormal   Collection Time    02/14/14  1:35 PM       Result Value Ref Range   Squamous Epithelial / LPF RARE  RARE   WBC, UA TOO NUMEROUS TO COUNT  <3 WBC/hpf   RBC / HPF 3-6  <3 RBC/hpf   Bacteria, UA FEW (*) RARE  GLUCOSE, CAPILLARY     Status: Abnormal   Collection Time    02/14/14  4:42 PM      Result Value Ref Range   Glucose-Capillary 270 (*) 70 - 99 mg/dL   Comment 1 Notify RN    GLUCOSE, CAPILLARY     Status: Abnormal   Collection Time    02/14/14  7:57 PM      Result Value Ref Range   Glucose-Capillary 237 (*) 70 - 99 mg/dL  GLUCOSE, CAPILLARY     Status: Abnormal   Collection Time    02/14/14 11:58 PM      Result Value Ref Range   Glucose-Capillary 195 (*) 70 - 99 mg/dL  GLUCOSE, CAPILLARY     Status: Abnormal   Collection Time    02/15/14  4:09 AM      Result Value Ref Range   Glucose-Capillary 100 (*) 70 - 99 mg/dL  COMPREHENSIVE METABOLIC PANEL     Status: Abnormal   Collection Time    02/15/14  5:15 AM      Result Value Ref Range   Sodium 145  137 - 147 mEq/L   Potassium 4.3  3.7 - 5.3 mEq/L   Chloride 107  96 - 112 mEq/L   CO2 28  19 - 32 mEq/L   Glucose, Bld 95  70 - 99 mg/dL   BUN 46 (*) 6 - 23 mg/dL   Creatinine, Ser 4.36 (*) 0.50 - 1.10 mg/dL   Calcium 8.7  8.4 - 10.5 mg/dL   Total Protein 6.2  6.0 - 8.3 g/dL   Albumin 2.2 (*) 3.5 - 5.2 g/dL   AST 22  0 - 37 U/L   ALT 21  0 - 35 U/L   Alkaline Phosphatase 82  39 - 117 U/L   Total Bilirubin 0.5  0.3 - 1.2 mg/dL   GFR calc non Af Amer 9 (*) >90 mL/min   GFR calc Af Amer 10 (*) >90 mL/min   Comment: (NOTE)  The eGFR has been calculated using the CKD EPI equation.     This calculation has not been validated in all clinical situations.     eGFR's persistently <90 mL/min signify possible Chronic Kidney     Disease.  GLUCOSE, CAPILLARY     Status: None   Collection Time    02/15/14  8:15 AM      Result Value Ref Range   Glucose-Capillary 79  70 - 99 mg/dL   Comment 1 Notify RN    GLUCOSE, CAPILLARY     Status: Abnormal   Collection Time     02/15/14 11:21 AM      Result Value Ref Range   Glucose-Capillary 192 (*) 70 - 99 mg/dL  GLUCOSE, CAPILLARY     Status: Abnormal   Collection Time    02/15/14  2:18 PM      Result Value Ref Range   Glucose-Capillary 241 (*) 70 - 99 mg/dL  GLUCOSE, CAPILLARY     Status: Abnormal   Collection Time    02/15/14  4:46 PM      Result Value Ref Range   Glucose-Capillary 298 (*) 70 - 99 mg/dL  GLUCOSE, CAPILLARY     Status: Abnormal   Collection Time    02/15/14  9:55 PM      Result Value Ref Range   Glucose-Capillary 286 (*) 70 - 99 mg/dL  GLUCOSE, CAPILLARY     Status: Abnormal   Collection Time    02/16/14  1:10 AM      Result Value Ref Range   Glucose-Capillary 157 (*) 70 - 99 mg/dL  GLUCOSE, CAPILLARY     Status: None   Collection Time    02/16/14  4:21 AM      Result Value Ref Range   Glucose-Capillary 78  70 - 99 mg/dL  GLUCOSE, CAPILLARY     Status: Abnormal   Collection Time    02/16/14  5:30 AM      Result Value Ref Range   Glucose-Capillary 109 (*) 70 - 99 mg/dL  GLUCOSE, CAPILLARY     Status: Abnormal   Collection Time    02/16/14  7:42 AM      Result Value Ref Range   Glucose-Capillary 108 (*) 70 - 99 mg/dL      Nursing note and vitals reviewed.  Constitutional: She appears well-developed and well-nourished.  HENT:  Head: Normocephalic and atraumatic.  Eyes: Conjunctivae are normal. Pupils are equal, round, and reactive to light.  Neck: Normal range of motion. Neck supple.  Cardiovascular: Normal rate and regular rhythm.  No murmur heard.  Respiratory: Effort normal and breath sounds normal. No respiratory distress. She has no wheezes.  GI: Soft. Bowel sounds are normal. She exhibits no distension.  Musculoskeletal: She exhibits no tenderness.  Neurological: She is alert.  Oriented to self and place, doesn't remember me. Unable to follow two step commands.needs gestural cuing for MMT   Skin: Skin is warm and dry.  needs cues for limited orientation  Motor  strength 4/5 in bilateral delt bi tri grip HF KE ADF      Assessment/Plan: 1. Functional deficits secondary to bilateral cerebral watershed infarcts which require 3+ hours per day of interdisciplinary therapy in a comprehensive inpatient rehab setting. Physiatrist is providing close team supervision and 24 hour management of active medical problems listed below. Physiatrist and rehab team continue to assess barriers to discharge/monitor patient progress toward functional and medical goals. Discussed with daughter bilateral infarcts and  the cognitive deficits that are limiting therapy progress.  Long term goals of supervision may not abe achieved for months and are dependant on cognitive recovery. Do not feel that pt will progress beyond Min A level even with an additional week of therapy From medical standpoint only new issue is UTI, ecoli with pending sensitivities. If e coli not sensitive to keflex then will switch antibiotic and extend stay for 72 hr to see if treatment may improve cognitive status  FIM: FIM - Bathing Bathing Steps Patient Completed: Chest;Right Arm;Left Arm;Abdomen;Front perineal area;Buttocks;Right upper leg;Left upper leg Bathing: 4: Min-Patient completes 8-9 [id number] parts or 75+ percent  FIM - Upper Body Dressing/Undressing Upper body dressing/undressing steps patient completed: Thread/unthread right sleeve of pullover shirt/dresss;Thread/unthread left sleeve of pullover shirt/dress;Put head through opening of pull over shirt/dress;Pull shirt over trunk Upper body dressing/undressing: 5: Set-up assist to: Obtain clothing/put away FIM - Lower Body Dressing/Undressing Lower body dressing/undressing steps patient completed: Thread/unthread left pants leg;Pull pants up/down;Thread/unthread right pants leg (wearing diaper today) Lower body dressing/undressing: 3: Mod-Patient completed 50-74% of tasks  FIM - Toileting Toileting steps completed by patient: Adjust clothing  prior to toileting;Adjust clothing after toileting;Performs perineal hygiene Toileting Assistive Devices: Grab bar or rail for support Toileting: 4: Steadying assist  FIM - TRadio producerDevices: Elevated toilet seat;Grab bars Toilet Transfers: 4-From toilet/BSC: Min A (steadying Pt. > 75%);4-To toilet/BSC: Min A (steadying Pt. > 75%)  FIM - BControl and instrumentation engineerDevices: Walker;Bed rails Bed/Chair Transfer: 5: Chair or W/C > Bed: Supervision (verbal cues/safety issues) (close supervision)  FIM - Locomotion: Wheelchair Distance: 35 Locomotion: Wheelchair: 1: Total Assistance/staff pushes wheelchair (Pt<25%) FIM - Locomotion: Ambulation Locomotion: Ambulation Assistive Devices: WAdministratorAmbulation/Gait Assistance: 5: Supervision (close supervision) Locomotion: Ambulation: 1: Travels less than 50 ft with supervision/safety issues  Comprehension Comprehension Mode: Auditory Comprehension: 3-Understands basic 50 - 74% of the time/requires cueing 25 - 50%  of the time  Expression Expression Mode: Verbal Expression: 2-Expresses basic 25 - 49% of the time/requires cueing 50 - 75% of the time. Uses single words/gestures.  Social Interaction Social Interaction: 2-Interacts appropriately 25 - 49% of time - Needs frequent redirection.  Problem Solving Problem Solving: 2-Solves basic 25 - 49% of the time - needs direction more than half the time to initiate, plan or complete simple activities  Memory Memory: 1-Recognizes or recalls less than 25% of the time/requires cueing greater than 75% of the time  Medical Problem List and Plan:  1. Functional deficits secondary to Bilateral cerebral watershed infarcts related to acute anemia from GIB  2. DVT Prophylaxis/Anticoagulation: Mechanical: Sequential compression devices, below knee Bilateral lower extremities  3. Pain Management: Tylenol prn for pain.  4. Mood: No signs of  distress. LCSW to follow with patient and family for evaluation and support.  5. Neuropsych: This patient is not capable of making decisions on her own behalf.  6. GIB due to erosive gatritis: Increase PPI to bid as at home.  7. HTN: Will monitor with tid checks. Continue metoprolol and Imdur.  8. Acute on CRF: Baseline Cr 1.2-1.7. No improvement of renal failure thus far 9. ABLA: Will continue to monitor H/H 3 x week. Is slowly improving. Will add iron supplement due to h/o iron deficiency.  10 DM type 2: Will resume Lantus insulin at dose of 10 units/HS. Monitor BS with ac/hs checks and titrate medications as indicated. Use SSI for elevated BS.  11. Chronic diastolic CHF: Check  daily weights and monitor for signs of overload. Low salt diet.  12 NSTEMI due to demand ischemia: treated medically with Imdur, metoprolol and Lipitor. No ASA due to GIB.  13. Mild dementia with sundowning (was functional PTA): 14.  Probable UTI  Start keflex     LOS (Days) 6 A FACE TO FACE EVALUATION WAS PERFORMED  Charlett Blake 02/16/2014, 8:14 AM

## 2014-02-16 NOTE — Progress Notes (Signed)
Urine culture returned greater than 100,000 Escherichia coli. Cultures sensitive to Cipro. Will discontinue Keflex at this time begin Cipro 250 mg daily secondary to renal insufficiency.

## 2014-02-17 ENCOUNTER — Inpatient Hospital Stay (HOSPITAL_COMMUNITY): Payer: Medicare Other | Admitting: Physical Therapy

## 2014-02-17 ENCOUNTER — Inpatient Hospital Stay (HOSPITAL_COMMUNITY): Payer: Self-pay

## 2014-02-17 ENCOUNTER — Encounter (HOSPITAL_COMMUNITY): Payer: Self-pay | Admitting: Occupational Therapy

## 2014-02-17 LAB — COMPREHENSIVE METABOLIC PANEL
ALT: 18 U/L (ref 0–35)
AST: 19 U/L (ref 0–37)
Albumin: 2.4 g/dL — ABNORMAL LOW (ref 3.5–5.2)
Alkaline Phosphatase: 88 U/L (ref 39–117)
BILIRUBIN TOTAL: 0.5 mg/dL (ref 0.3–1.2)
BUN: 41 mg/dL — ABNORMAL HIGH (ref 6–23)
CO2: 24 meq/L (ref 19–32)
CREATININE: 4.36 mg/dL — AB (ref 0.50–1.10)
Calcium: 8.6 mg/dL (ref 8.4–10.5)
Chloride: 103 mEq/L (ref 96–112)
GFR calc Af Amer: 10 mL/min — ABNORMAL LOW (ref >90)
GFR, EST NON AFRICAN AMERICAN: 9 mL/min — AB (ref >90)
Glucose, Bld: 136 mg/dL — ABNORMAL HIGH (ref 70–99)
Potassium: 4.1 mEq/L (ref 3.7–5.3)
SODIUM: 141 meq/L (ref 137–147)
Total Protein: 6.6 g/dL (ref 6.0–8.3)

## 2014-02-17 LAB — GLUCOSE, CAPILLARY
GLUCOSE-CAPILLARY: 241 mg/dL — AB (ref 70–99)
GLUCOSE-CAPILLARY: 229 mg/dL — AB (ref 70–99)
Glucose-Capillary: 133 mg/dL — ABNORMAL HIGH (ref 70–99)
Glucose-Capillary: 166 mg/dL — ABNORMAL HIGH (ref 70–99)

## 2014-02-17 MED ORDER — PANTOPRAZOLE SODIUM 40 MG PO TBEC: 40.0000 mg | DELAYED_RELEASE_TABLET | Freq: Two times a day (BID) | ORAL | Status: DC

## 2014-02-17 MED ORDER — METOPROLOL SUCCINATE ER 25 MG PO TB24: 25.0000 mg | ORAL_TABLET | Freq: Every day | ORAL | Status: DC

## 2014-02-17 MED ORDER — ATORVASTATIN CALCIUM 40 MG PO TABS: 40.0000 mg | ORAL_TABLET | Freq: Every day | ORAL | Status: DC

## 2014-02-17 MED ORDER — AMLODIPINE BESYLATE 10 MG PO TABS: 10.0000 mg | ORAL_TABLET | Freq: Every day | ORAL | Status: DC

## 2014-02-17 MED ORDER — INSULIN GLARGINE 100 UNIT/ML ~~LOC~~ SOLN: 10.0000 [IU] | Freq: Every day | SUBCUTANEOUS | Status: DC

## 2014-02-17 MED ORDER — ISOSORBIDE MONONITRATE ER 60 MG PO TB24: 60.0000 mg | ORAL_TABLET | Freq: Every day | ORAL | Status: DC

## 2014-02-17 MED ORDER — CIPROFLOXACIN HCL 250 MG PO TABS: 250.0000 mg | ORAL_TABLET | Freq: Every day | ORAL | Status: DC

## 2014-02-17 MED ORDER — ALBUTEROL SULFATE HFA 108 (90 BASE) MCG/ACT IN AERS: 2.0000 | INHALATION_SPRAY | Freq: Four times a day (QID) | RESPIRATORY_TRACT | Status: DC | PRN

## 2014-02-17 NOTE — Progress Notes (Signed)
Inpatient Diabetes Program Recommendations  AACE/ADA: New Consensus Statement on Inpatient Glycemic Control (2013)  Target Ranges:  Prepandial:   less than 140 mg/dL      Peak postprandial:   less than 180 mg/dL (1-2 hours)      Critically ill patients:  140 - 180 mg/dL   Reason for Visit: Post-prandial blood sugars elevated  Results for Cheryl, Hurley (MRN [id number]) as of 02/17/2014 10:40  Ref. Range 02/16/2014 07:42 02/16/2014 11:37 02/16/2014 16:49 02/16/2014 20:52 02/17/2014 07:14  Glucose-Capillary Latest Range: 70-99 mg/dL 108 (H) 224 (H) 237 (H) 231 (H) 133 (H)   Eating >75%.  Blood sugars go up after meals.  Please consider addition of meal coverage insulin - Novolog 3 units tidwc if pt eats >50% meal  Will continue to follow. Thank you. Lorenda Peck, RD, LDN, CDE Inpatient Diabetes Coordinator [phone]

## 2014-02-17 NOTE — Progress Notes (Signed)
Speech Language Pathology Discharge Summary  Patient Details  Name: [name] MRN: [id number] Date of Birth: 10/28/1932   Patient has met 4 of 4 long term goals.  Patient to discharge at overall Max level.  Reasons goals not met: N/A   Clinical Impression/Discharge Summary: Pt has met 4 out of 4 LTGs during this admission due to improvements in swallowing and cognitive-linguistic function. Pt is consuming Dys 3 textures and thin liquids with supervision level cues for use of safe swallowing strategies. Pt is discharging at an overall Max A level for cognition including basic comprehension, verbal expression, and problem solving, as her abilities fluctuate based on internal motivation for task completion. Pt is scheduled to d/c home with family, who has been present for family training. Pt will benefit from continued home health SLP services to maximize functional independence and communication in order to decrease caregiver burden.  Care Partner:  Caregiver Able to Provide Assistance: Yes  Type of Caregiver Assistance: Cognitive  Recommendation:  Home Health SLP;24 hour supervision/assistance  Rationale for SLP Follow Up: Maximize functional communication;Maximize cognitive function and independence;Maximize swallowing safety;Reduce caregiver burden   Equipment:  N/A  Reasons for discharge: Treatment goals met   Patient/Family Agrees with Progress Made and Goals Achieved: Yes   See FIM for current functional status   Germain Osgood, M.A. CCC-SLP [phone]  Germain Osgood 02/17/2014, 4:28 PM

## 2014-02-17 NOTE — Discharge Instructions (Signed)
Inpatient Rehab Discharge Instructions  [name] Discharge date and time: No discharge date for patient encounter.   Activities/Precautions/ Functional Status: Activity: activity as tolerated Diet: Soft diet low-salt Wound Care: none needed Functional status:  ___ No restrictions     ___ Walk up steps independently ___ 24/7 supervision/assistance   ___ Walk up steps with assistance ___ Intermittent supervision/assistance  ___ Bathe/dress independently ___ Walk with walker     ___ Bathe/dress with assistance ___ Walk Independently    ___ Shower independently _x STROKE/TIA DISCHARGE INSTRUCTIONS SMOKING Cigarette smoking nearly doubles your risk of having a stroke & is the single most alterable risk factor  If you smoke or have smoked in the last 12 months, you are advised to quit smoking for your health.  Most of the excess cardiovascular risk related to smoking disappears within a year of stopping.  Ask you doctor about anti-smoking medications  [name] Quit Line: 1-800-QUIT NOW  Free Smoking Cessation Classes [phone]  CHOLESTEROL Know your levels; limit fat & cholesterol in your diet  Lipid Panel     Component Value Date/Time   CHOL 130 02/09/2014 0650   TRIG 61 02/09/2014 0650   HDL 60 02/09/2014 0650   CHOLHDL 2.2 02/09/2014 0650   VLDL 12 02/09/2014 0650   LDLCALC 58 02/09/2014 0650      Many patients benefit from treatment even if their cholesterol is at goal.  Goal: Total Cholesterol (CHOL) less than 160  Goal:  Triglycerides (TRIG) less than 150  Goal:  HDL greater than 40  Goal:  LDL (LDLCALC) less than 100   BLOOD PRESSURE American Stroke Association blood pressure target is less that 120/80 mm/Hg  Your discharge blood pressure is:  BP: 167/53 mmHg (during OT)  Monitor your blood pressure  Limit your salt and alcohol intake  Many individuals will require more than one medication for high blood pressure  DIABETES (A1c is a blood sugar average for last 3  months) Goal HGBA1c is under 7% (HBGA1c is blood sugar average for last 3 months)  Diabetes:    Lab Results  Component Value Date   HGBA1C 11.5* 02/04/2014     Your HGBA1c can be lowered with medications, healthy diet, and exercise.  Check your blood sugar as directed by your physician  Call your physician if you experience unexplained or low blood sugars.  PHYSICAL ACTIVITY/REHABILITATION Goal is 30 minutes at least 4 days per week  Activity: Increase activity slowly, Therapies: Physical Therapy: Home Health Return to work:   Activity decreases your risk of heart attack and stroke and makes your heart stronger.  It helps control your weight and blood pressure; helps you relax and can improve your mood.  Participate in a regular exercise program.  Talk with your doctor about the best form of exercise for you (dancing, walking, swimming, cycling).  DIET/WEIGHT Goal is to maintain a healthy weight  Your discharge diet is: Dysphagia  liquids Your height is:  Height: 5\' 2"  (157.5 cm) Your current weight is: Weight: 82.4 kg (181 lb 10.5 oz) Your Body Mass Index (BMI) is:  BMI (Calculated): 33.5  Following the type of diet specifically designed for you will help prevent another stroke.  Your goal weight range is:    Your goal Body Mass Index (BMI) is 19-24.  Healthy food habits can help reduce 3 risk factors for stroke:  High cholesterol, hypertension, and excess weight.  RESOURCES Stroke/Support Group:  Call [name] PROVIDED/REVIEWED  AND GIVEN TO PATIENT Stroke warning signs and symptoms How to activate emergency medical system (call 911). Medications prescribed at discharge. Need for follow-up after discharge. Personal risk factors for stroke. Pneumonia vaccine given:  Flu vaccine given:  My questions have been answered, the writing is legible, and I understand these instructions.  I will adhere to these goals & educational materials that have been  provided to me after my discharge from the hospital.    __ Walk with assistance    ___ Shower with assistance ___ No alcohol     ___ Return to work/school ________  Special Instructions: Begin aspirin 81 mg daily in one week.   COMMUNITY REFERRALS UPON DISCHARGE:    Home Health:   PT, OT, SPT, RN, [name]:[phone] Date of last service:02/18/2014  Medical Equipment/Items [name]    [phone] Other:PCS PAPERWORK FAXED IN TO [name] RESOURCES FOR PATIENT/FAMILY: Support Groups:[facility]EMENT[facility] SUPPORT GROUP  My questions have been answered and I understand these instructions. I will adhere to these goals and the provided educational materials after my discharge from the hospital.  Patient/Caregiver Signature _______________________________ Date __________  Clinician Signature _______________________________________ Date __________  Please bring this form and your medication list with you to all your follow-up doctor's appointments.

## 2014-02-17 NOTE — Progress Notes (Signed)
Social Work Patient ID: [name], female   DOB: 1933/01/22, 80 y.o.   MRN: [id number] MD changing pt's antibiotic and will await 72 hr to see if working on pt's UTI.  Wants to reassess on Monday. Will inform team and talk with family.

## 2014-02-17 NOTE — Progress Notes (Signed)
80 y.o. female with h/o CAD, DM type 2 with gastroparesis, CHF exacerbation 11/2103, GIB, early dementia who has had recent increase in falls with melena. She was admitted on 04/25 /15 with expressive aphasia, lethargy as well as twisted lip. She was found to be in acute renal failure and anemic with hgb-5.6. She was treated with IVF and transfused with 2 unit PRBC. Patient with elevated cardiac enzyme that was felt to be multifactorial due to demand ischemia. 2 D echo with EF 40-45%. Dr. Jonnie Finner consulted for input on acute on chronic renal failure and recommended d/c ace as well as hydration as patient poor HD candidate. Patient continued to be confused with fluctuating mentation and MRI brain done revealing multiple bilateral watershed infarcts. Carotid dopplers without ICA stenosis. Neurology recommended ASA if GIB resolved. Dr. Carlean Purl consulted for input and EGD done on 02/08/14 Revealing erosive gastritis in gastric antrum. She's cleared to use low dose ASA in two weeks if needed. Neurology feels that patient with embolic infarcts due to A Fib and Eliquis 2.5 mg bid recommended if CrCL >30.   Subjective/Complaints: Pt sleeping but arouses to voice Po caloric intake 10-100%,variable, fluid intake about 756m/24h Daughter in room, concerned about DC date Review of Systems - cannot obtain secondary to mental status  Objective: Vital Signs: Blood pressure 166/70, pulse 66, temperature 98.3 F (36.8 C), temperature source Oral, resp. rate 18, height _0  (1.575 m), weight 79.6 kg (175 lb 7.8 oz), last menstrual period 12/19/1968, SpO2 98.00%. No results found. Results for orders placed during the hospital encounter of 02/10/14 (from the past 72 hour(s))  GLUCOSE, CAPILLARY     Status: None   Collection Time    02/14/14  8:25 AM      Result Value Ref Range   Glucose-Capillary 76  70 - 99 mg/dL  GLUCOSE, CAPILLARY     Status: Abnormal   Collection Time    02/14/14 12:03 PM      Result Value  Ref Range   Glucose-Capillary 163 (*) 70 - 99 mg/dL  URINALYSIS, ROUTINE W REFLEX MICROSCOPIC     Status: Abnormal   Collection Time    02/14/14  1:35 PM      Result Value Ref Range   Color, Urine YELLOW  YELLOW   APPearance CLOUDY (*) CLEAR   Specific Gravity, Urine 1.011  1.005 - 1.030   pH 7.5  5.0 - 8.0   Glucose, UA NEGATIVE  NEGATIVE mg/dL   Hgb urine dipstick SMALL (*) NEGATIVE   Bilirubin Urine NEGATIVE  NEGATIVE   Ketones, ur NEGATIVE  NEGATIVE mg/dL   Protein, ur 100 (*) NEGATIVE mg/dL   Urobilinogen, UA 0.2  0.0 - 1.0 mg/dL   Nitrite NEGATIVE  NEGATIVE   Leukocytes, UA LARGE (*) NEGATIVE  URINE CULTURE     Status: None   Collection Time    02/14/14  1:35 PM      Result Value Ref Range   Specimen Description URINE, CLEAN CATCH     Special Requests NONE     Culture  Setup Time       Value: 02/14/2014 13:43     Performed at [email or web address]       Value: >=100,000 COLONIES/ML     Performed at [facility]  Culture       Value: ESCHERICHIA COLI     Performed at [facility]  Report Status 02/16/2014 FINAL     Organism  ID, Bacteria ESCHERICHIA COLI    URINE MICROSCOPIC-ADD ON     Status: Abnormal   Collection Time    02/14/14  1:35 PM      Result Value Ref Range   Squamous Epithelial / LPF RARE  RARE   WBC, UA TOO NUMEROUS TO COUNT  <3 WBC/hpf   RBC / HPF 3-6  <3 RBC/hpf   Bacteria, UA FEW (*) RARE  GLUCOSE, CAPILLARY     Status: Abnormal   Collection Time    02/14/14  4:42 PM      Result Value Ref Range   Glucose-Capillary 270 (*) 70 - 99 mg/dL   Comment 1 Notify RN    GLUCOSE, CAPILLARY     Status: Abnormal   Collection Time    02/14/14  7:57 PM      Result Value Ref Range   Glucose-Capillary 237 (*) 70 - 99 mg/dL  GLUCOSE, CAPILLARY     Status: Abnormal   Collection Time    02/14/14 11:58 PM      Result Value Ref Range   Glucose-Capillary 195 (*) 70 - 99 mg/dL  GLUCOSE, CAPILLARY     Status: Abnormal   Collection  Time    02/15/14  4:09 AM      Result Value Ref Range   Glucose-Capillary 100 (*) 70 - 99 mg/dL  COMPREHENSIVE METABOLIC PANEL     Status: Abnormal   Collection Time    02/15/14  5:15 AM      Result Value Ref Range   Sodium 145  137 - 147 mEq/L   Potassium 4.3  3.7 - 5.3 mEq/L   Chloride 107  96 - 112 mEq/L   CO2 28  19 - 32 mEq/L   Glucose, Bld 95  70 - 99 mg/dL   BUN 46 (*) 6 - 23 mg/dL   Creatinine, Ser 4.36 (*) 0.50 - 1.10 mg/dL   Calcium 8.7  8.4 - 10.5 mg/dL   Total Protein 6.2  6.0 - 8.3 g/dL   Albumin 2.2 (*) 3.5 - 5.2 g/dL   AST 22  0 - 37 U/L   ALT 21  0 - 35 U/L   Alkaline Phosphatase 82  39 - 117 U/L   Total Bilirubin 0.5  0.3 - 1.2 mg/dL   GFR calc non Af Amer 9 (*) >90 mL/min   GFR calc Af Amer 10 (*) >90 mL/min   Comment: (NOTE)     The eGFR has been calculated using the CKD EPI equation.     This calculation has not been validated in all clinical situations.     eGFR's persistently <90 mL/min signify possible Chronic Kidney     Disease.  GLUCOSE, CAPILLARY     Status: None   Collection Time    02/15/14  8:15 AM      Result Value Ref Range   Glucose-Capillary 79  70 - 99 mg/dL   Comment 1 Notify RN    GLUCOSE, CAPILLARY     Status: Abnormal   Collection Time    02/15/14 11:21 AM      Result Value Ref Range   Glucose-Capillary 192 (*) 70 - 99 mg/dL  GLUCOSE, CAPILLARY     Status: Abnormal   Collection Time    02/15/14  2:18 PM      Result Value Ref Range   Glucose-Capillary 241 (*) 70 - 99 mg/dL  GLUCOSE, CAPILLARY     Status: Abnormal   Collection Time  02/15/14  4:46 PM      Result Value Ref Range   Glucose-Capillary 298 (*) 70 - 99 mg/dL  GLUCOSE, CAPILLARY     Status: Abnormal   Collection Time    02/15/14  9:55 PM      Result Value Ref Range   Glucose-Capillary 286 (*) 70 - 99 mg/dL  GLUCOSE, CAPILLARY     Status: Abnormal   Collection Time    02/16/14  1:10 AM      Result Value Ref Range   Glucose-Capillary 157 (*) 70 - 99 mg/dL   GLUCOSE, CAPILLARY     Status: None   Collection Time    02/16/14  4:21 AM      Result Value Ref Range   Glucose-Capillary 78  70 - 99 mg/dL  GLUCOSE, CAPILLARY     Status: Abnormal   Collection Time    02/16/14  5:30 AM      Result Value Ref Range   Glucose-Capillary 109 (*) 70 - 99 mg/dL  GLUCOSE, CAPILLARY     Status: Abnormal   Collection Time    02/16/14  7:42 AM      Result Value Ref Range   Glucose-Capillary 108 (*) 70 - 99 mg/dL  CBC     Status: Abnormal   Collection Time    02/16/14  9:25 AM      Result Value Ref Range   WBC 8.1  4.0 - 10.5 K/uL   RBC 3.63 (*) 3.87 - 5.11 MIL/uL   Hemoglobin 8.1 (*) 12.0 - 15.0 g/dL   HCT 26.1 (*) 36.0 - 46.0 %   MCV 71.9 (*) 78.0 - 100.0 fL   MCH 22.3 (*) 26.0 - 34.0 pg   MCHC 31.0  30.0 - 36.0 g/dL   RDW 23.0 (*) 11.5 - 15.5 %   Platelets 367  150 - 400 K/uL  GLUCOSE, CAPILLARY     Status: Abnormal   Collection Time    02/16/14 11:37 AM      Result Value Ref Range   Glucose-Capillary 224 (*) 70 - 99 mg/dL  GLUCOSE, CAPILLARY     Status: Abnormal   Collection Time    02/16/14  4:49 PM      Result Value Ref Range   Glucose-Capillary 237 (*) 70 - 99 mg/dL  GLUCOSE, CAPILLARY     Status: Abnormal   Collection Time    02/16/14  8:52 PM      Result Value Ref Range   Glucose-Capillary 231 (*) 70 - 99 mg/dL      Nursing note and vitals reviewed.  Constitutional: She appears well-developed and well-nourished.  HENT:  Head: Normocephalic and atraumatic.  Eyes: Conjunctivae are normal. Pupils are equal, round, and reactive to light.  Neck: Normal range of motion. Neck supple.  Cardiovascular: Normal rate and regular rhythm.  No murmur heard.  Respiratory: Effort normal and breath sounds normal. No respiratory distress. She has no wheezes.  GI: Soft. Bowel sounds are normal. She exhibits no distension.  Musculoskeletal: She exhibits no tenderness.  Neurological: She is alert.  Oriented to self and place, doesn't remember me.  Unable to follow two step commands.needs gestural cuing for MMT   Skin: Skin is warm and dry.  needs cues for limited orientation  Motor strength 4/5 in bilateral delt bi tri grip HF KE ADF      Assessment/Plan: 1. Functional deficits secondary to bilateral cerebral watershed infarcts which require 3+ hours per day of interdisciplinary therapy in  a comprehensive inpatient rehab setting. Physiatrist is providing close team supervision and 24 hour management of active medical problems listed below. Physiatrist and rehab team continue to assess barriers to discharge/monitor patient progress toward functional and medical goals.  From medical standpoint only new issue is UTI, ecoli not sensitive to keflex  switch antibiotic and extend stay for 72 hr to see if treatment may improve cognitive status reassess therapy progress on 5/11.  FIM: FIM - Bathing Bathing Steps Patient Completed: Chest;Right Arm;Left Arm;Abdomen;Front perineal area;Buttocks;Right upper leg;Left upper leg Bathing: 4: Min-Patient completes 8-9 31f10 parts or 75+ percent  FIM - Upper Body Dressing/Undressing Upper body dressing/undressing steps patient completed: Thread/unthread right sleeve of pullover shirt/dresss;Thread/unthread left sleeve of pullover shirt/dress;Put head through opening of pull over shirt/dress;Pull shirt over trunk Upper body dressing/undressing: 5: Set-up assist to: Obtain clothing/put away FIM - Lower Body Dressing/Undressing Lower body dressing/undressing steps patient completed: Thread/unthread left pants leg;Pull pants up/down;Thread/unthread right pants leg (wearing diaper today) Lower body dressing/undressing: 3: Mod-Patient completed 50-74% of tasks  FIM - Toileting Toileting steps completed by patient: Adjust clothing prior to toileting;Performs perineal hygiene;Adjust clothing after toileting Toileting Assistive Devices: Grab bar or rail for support Toileting: 4: Steadying assist  FIM -  TRadio producerDevices: Elevated toilet seat;Grab bars Toilet Transfers: 5-To toilet/BSC: Supervision (verbal cues/safety issues);5-From toilet/BSC: Supervision (verbal cues/safety issues) (close supervision)  FIM - BControl and instrumentation engineerDevices: Walker;Bed rails Bed/Chair Transfer: 5: Bed > Chair or W/C: Supervision (verbal cues/safety issues);5: Chair or W/C > Bed: Supervision (verbal cues/safety issues);4: Supine > Sit: Min A (steadying Pt. > 75%/lift 1 leg);5: Sit > Supine: Supervision (verbal cues/safety issues)  FIM - Locomotion: Wheelchair Distance: 90 Locomotion: Wheelchair: 1: Total Assistance/staff pushes wheelchair (Pt<25%) FIM - Locomotion: Ambulation Locomotion: Ambulation Assistive Devices: WAdministratorAmbulation/Gait Assistance: 5: Supervision Locomotion: Ambulation: 0: Activity did not occur  Comprehension Comprehension Mode: Auditory Comprehension: 3-Understands basic 50 - 74% of the time/requires cueing 25 - 50%  of the time  Expression Expression Mode: Verbal Expression: 2-Expresses basic 25 - 49% of the time/requires cueing 50 - 75% of the time. Uses single words/gestures.  Social Interaction Social Interaction: 2-Interacts appropriately 25 - 49% of time - Needs frequent redirection.  Problem Solving Problem Solving: 2-Solves basic 25 - 49% of the time - needs direction more than half the time to initiate, plan or complete simple activities  Memory Memory: 1-Recognizes or recalls less than 25% of the time/requires cueing greater than 75% of the time  Medical Problem List and Plan:  1. Functional deficits secondary to Bilateral cerebral watershed infarcts related to acute anemia from GIB  2. DVT Prophylaxis/Anticoagulation: Mechanical: Sequential compression devices, below knee Bilateral lower extremities  3. Pain Management: Tylenol prn for pain.  4. Mood: No signs of distress. LCSW to follow with  patient and family for evaluation and support.  5. Neuropsych: This patient is not capable of making decisions on her own behalf.  6. GIB due to erosive gatritis: Increase PPI to bid as at home.  7. HTN: Will monitor with tid checks. Continue metoprolol and Imdur.  8. Acute on CRF: Baseline Cr 1.2-1.7. No improvement of renal failure thus far 9. ABLA: Will continue to monitor H/H 3 x week. Is slowly improving. Will add iron supplement due to h/o iron deficiency.  10 DM type 2: Will resume Lantus insulin at dose of 10 units/HS. Monitor BS with ac/hs checks and titrate medications as indicated. Use  SSI for elevated BS.  11. Chronic diastolic CHF: compensatedCheck daily weights and monitor for signs of overload. Low salt diet.  12 NSTEMI due to demand ischemia: treated medically with Imdur, metoprolol and Lipitor. No ASA due to GIB.  13. Mild dementia with sundowning (was functional PTA):                            14.  E coli UTI  Resistant to  keflex now on ciprofloxacin    LOS (Days) 7 A FACE TO FACE EVALUATION WAS PERFORMED  Charlett Blake 02/17/2014, 6:57 AM

## 2014-02-17 NOTE — Progress Notes (Signed)
Speech Language Pathology Daily Session Note  Patient Details  Name: [name] MRN: [id number] Date of Birth: July 12, 1933  Today's Date: 02/17/2014 Time: 3532-9924 Time Calculation (min): 30 min  Short Term Goals: Week 1: SLP Short Term Goal 1 (Week 1): Patient will initiate response to biographical and immediate needs questions with minimal cues. SLP Short Term Goal 2 (Week 1): Patient will follow 1-step/basic level directions to complete functional tasks wtih 75% accuracy.  SLP Short Term Goal 3 (Week 1): Patient will sustain attention to task and during conversation for 2-3 minutes with moderate cues. SLP Short Term Goal 4 (Week 1): patient will consume least restrictive diet with minimal assistance and no ovet s/s aspiration  Skilled Therapeutic Interventions: Skilled treatment focused on cognitive goals. SLP facilitated session with binary choice for treatment activities. Pt required Min cues to make her choice, and sustained her attention to skimming through her bible for ~10 seconds. Pt required Max cues for alertness and initiation of tasks, and fatigued quickly during session. Despite Max multimodal cues, pt followed one-step commands ~50% of the time. Pt missed 15 minutes of speech therapy due to fatigue impacting her ability participate in tasks despite Max cues and encouragement.   FIM:  Comprehension Comprehension Mode: Auditory Comprehension: 3-Understands basic 50 - 74% of the time/requires cueing 25 - 50%  of the time Expression Expression Mode: Verbal Expression: 2-Expresses basic 25 - 49% of the time/requires cueing 50 - 75% of the time. Uses single words/gestures. Social Interaction Social Interaction: 2-Interacts appropriately 25 - 49% of time - Needs frequent redirection. Problem Solving Problem Solving: 2-Solves basic 25 - 49% of the time - needs direction more than half the time to initiate, plan or complete simple activities Memory Memory: 1-Recognizes or  recalls less than 25% of the time/requires cueing greater than 75% of the time  Pain Pain Assessment Pain Assessment: No/denies pain  Therapy/Group: Individual Therapy   Germain Osgood, M.A. CCC-SLP [phone]  Germain Osgood 02/17/2014, 9:55 AM

## 2014-02-17 NOTE — Progress Notes (Signed)
Social Work Patient ID: [name], female   DOB: 02-Jul-1933, 80 y.o.   MRN: [id number] Met with son to inform of MD plan to re-assess Monday to see if antibiotic working and if improves pt's cognition. He is agreeable and is hopeful pt will do better.  Continues to be aware the team recommendation is 24 hr care upon discharge.

## 2014-02-17 NOTE — Progress Notes (Signed)
Occupational Therapy Session Note  Patient Details  Name: [name] MRN: [id number] Date of Birth: 06-15-1933  Today's Date: 02/17/2014 Time: 1000-1100 Time Calculation (min): 60 min  Short Term Goals: Week 1:  OT Short Term Goal 1 (Week 1): Pt. will compensated for right field cut by looking to right with 1 verba prompt OT Short Term Goal 2 (Week 1): Pt. will dress self with minimal assist OT Short Term Goal 3 (Week 1): Pt. will bathe self with minimal assist OT Short Term Goal 4 (Week 1): Pt. will transfer to toilet with SBA OT Short Term Goal 5 (Week 1): Pt. will transfer to bathtub with shower with minimal assist  Skilled Therapeutic Interventions/Progress Updates:    self care retraining at shower level with focus on functional ambulation with safe use of RW with min to mod instructional and demonstrative cuing, toilet and shower stall transfer with use of grab bar with supervision using increased time to complete all tasks, sit to stand, standing balance, sequencing, etc. Pt spent several minutes re-washing abdomen and chest. Required cueing to transition to each step of bathing. Patient requested to lay down at end of ADL. Patient left laying down in bed with bed alarm, call light within reach, and husband in room.      Therapy Documentation Precautions:  Precautions Precautions: Fall Precaution Comments: right field cut Restrictions Weight Bearing Restrictions: No    Pain: Pain Assessment Pain Assessment: No/denies pain  See FIM for current functional status  Therapy/Group: Individual Therapy  Debby Bud 02/17/2014, 10:58 AM

## 2014-02-17 NOTE — Progress Notes (Signed)
Physical Therapy Session Note  Patient Details  Name: [name] MRN: [id number] Date of Birth: 26-Nov-1932  Today's Date: 02/17/2014 Time: 6644-0347 and 1330-1410 Time Calculation (min): 44 min and 40 min Short Term Goals: Week 1: STG's=LTG's secondary to ELOS  Skilled Therapeutic Interventions/Progress Updates:    Treatment Session 1: Pt received seated EOB, eating breakfast with nurse tech present. Pt agreeable to therapy. Session planned to focus on family education; however, no family present at this time. Session therefore focused on functional mobility in household setting. Pt performed toilet transfer with rolling walker, grab bars, and supervision. Pt continent of bladder; nurse tech notified. Cueing required for safety awareness with functional transfers, for safe proximity to sink while washing hands. Transported pt to rehab apartment, where pt performed supine<>sit with supervision, HOB flat, no rails. Performed furniture transfer (sit<>stand from couch) with supervision, cueing for hand placement. Gait x30' in home environment with rolling walker and supervision/cueing for safe use of rolling walker. While pt looking in kitchen cabinets, performed sidestepping with rolling walker, min A, verbal cueing for technique and manual facilitation sideways advancement of rolling walker. Pt opened refrigerator door using RUE with LUE support at rolling walker without LOB. Transported pt to room in w/c, where pt was left seated in w/c with quick release belt on for safety and all needs within reach.   Treatment Session 2: Pt received supine in bed; agreeable to therapy. Session focused on completion of hands-on family training. Supine>sit with HOB flat, no rails, supervision. Transported pt in w/c to ortho gym, where therapist explained, demonstrated the following: stand pivot transfer from w/c<>car with rolling walker and min A, mod verbal cueing for turning/proximity to car prior to sitting;  negotiation of single step, then landing, followed by additional with rolling walker and min guard, manual stabilization of rolling walker; forward-facing with step-to pattern; negotiation of standard ramp with rolling walker and min A to control descent of walker, verbal cueing for pt proximity to rolling walker. Son gave effective return demonstration of technique for assisting and providing cueing with all described aspects of functional mobility.  Son also managed all w/c parts without assist/cueing from this PT. Family education therefore completed for son, who expresses feeling comfortable assisting, supervision, and cueing pt with all aspects of functional mobility.Therapist verbally recommended to son, [name], that any additional family members who will be assisting pt at discharge should be present this weekend to receive training. Son verbalized understanding and was in full agreement.  Long term goal addressing car transfer downgraded to min A secondary to pt progress. Long term goal addressing bed mobility upgraded to supervision secondary to pt progress.  Therapy Documentation Precautions:  Precautions Precautions: Fall Precaution Comments: right field cut Restrictions Weight Bearing Restrictions: No Pain: Pain Assessment Pain Assessment: No/denies pain Locomotion : Ambulation Ambulation/Gait Assistance: 5: Supervision  See FIM for current functional status  Therapy/Group: Individual Therapy  Malva Cogan Johany Hansman 02/17/2014, 12:35 PM

## 2014-02-18 ENCOUNTER — Inpatient Hospital Stay (HOSPITAL_COMMUNITY): Payer: Self-pay | Admitting: *Deleted

## 2014-02-18 DIAGNOSIS — D649 Anemia, unspecified: Secondary | ICD-10-CM | POA: Diagnosis not present

## 2014-02-18 DIAGNOSIS — I6350 Cerebral infarction due to unspecified occlusion or stenosis of unspecified cerebral artery: Secondary | ICD-10-CM | POA: Diagnosis not present

## 2014-02-18 DIAGNOSIS — I6340 Cerebral infarction due to embolism of unspecified cerebral artery: Secondary | ICD-10-CM | POA: Diagnosis not present

## 2014-02-18 LAB — GLUCOSE, CAPILLARY
GLUCOSE-CAPILLARY: 156 mg/dL — AB (ref 70–99)
GLUCOSE-CAPILLARY: 193 mg/dL — AB (ref 70–99)
Glucose-Capillary: 126 mg/dL — ABNORMAL HIGH (ref 70–99)
Glucose-Capillary: 338 mg/dL — ABNORMAL HIGH (ref 70–99)

## 2014-02-18 NOTE — Progress Notes (Signed)
Patient ID: [name], female   DOB: [birth year], 80 y.o.   MRN: [id number]  02/18/2014.  80 y.o. female with h/o CAD, DM type 2 with gastroparesis  and peripheral neuropathy, CHF exacerbation 11/2103, GIB, early dementia who has had recent increase in falls with melena. She was admitted on 04/25 /15 with expressive aphasia, lethargy. She was found to be in acute renal failure and anemic with hgb-5.6. She was treated with IVF and transfused with 2 unit PRBC. Patient with elevated cardiac enzyme that was felt to be multifactorial due to demand ischemia. 2 D echo with EF 40-45%. Dr. Jonnie Finner consulted for input on acute on chronic renal failure and recommended d/c ace as well as hydration as patient poor HD candidate. Patient continued to be confused with fluctuating mentation and MRI brain done revealing multiple bilateral watershed infarcts. Carotid dopplers without ICA stenosis. Neurology recommended ASA if GIB resolved. Dr. Carlean Purl consulted for input and EGD done on 02/08/14 Revealing erosive gastritis in gastric antrum. She's cleared to use low dose ASA in two weeks if needed. Neurology feels that patient with embolic infarcts due to A Fib and Eliquis 2.5 mg bid recommended if CrCL >30.   Subjective/Complaints: Alert.  Mildly confused.  No complaints   Objective: Vital Signs: Blood pressure 157/53, pulse 66, temperature 98.7 F (37.1 C), temperature source Oral, resp. rate 18, height $RemoveBe'5\' 2"'wpoOjJIDc$  (1.575 m), weight 76.2 kg (167 lb 15.9 oz), last menstrual period 12/19/1968, SpO2 98.00%. No results found. Results for orders placed during the hospital encounter of 02/10/14 (from the past 72 hour(s))  GLUCOSE, CAPILLARY     Status: Abnormal   Collection Time    02/15/14 11:21 AM      Result Value Ref Range   Glucose-Capillary 192 (*) 70 - 99 mg/dL  GLUCOSE, CAPILLARY     Status: Abnormal   Collection Time    02/15/14  2:18 PM      Result Value Ref Range   Glucose-Capillary 241 (*) 70 - 99 mg/dL   GLUCOSE, CAPILLARY     Status: Abnormal   Collection Time    02/15/14  4:46 PM      Result Value Ref Range   Glucose-Capillary 298 (*) 70 - 99 mg/dL  GLUCOSE, CAPILLARY     Status: Abnormal   Collection Time    02/15/14  9:55 PM      Result Value Ref Range   Glucose-Capillary 286 (*) 70 - 99 mg/dL  GLUCOSE, CAPILLARY     Status: Abnormal   Collection Time    02/16/14  1:10 AM      Result Value Ref Range   Glucose-Capillary 157 (*) 70 - 99 mg/dL  GLUCOSE, CAPILLARY     Status: None   Collection Time    02/16/14  4:21 AM      Result Value Ref Range   Glucose-Capillary 78  70 - 99 mg/dL  GLUCOSE, CAPILLARY     Status: Abnormal   Collection Time    02/16/14  5:30 AM      Result Value Ref Range   Glucose-Capillary 109 (*) 70 - 99 mg/dL  GLUCOSE, CAPILLARY     Status: Abnormal   Collection Time    02/16/14  7:42 AM      Result Value Ref Range   Glucose-Capillary 108 (*) 70 - 99 mg/dL  CBC     Status: Abnormal   Collection Time    02/16/14  9:25 AM      Result  Value Ref Range   WBC 8.1  4.0 - 10.5 K/uL   RBC 3.63 (*) 3.87 - 5.11 MIL/uL   Hemoglobin 8.1 (*) 12.0 - 15.0 g/dL   HCT 26.1 (*) 36.0 - 46.0 %   MCV 71.9 (*) 78.0 - 100.0 fL   MCH 22.3 (*) 26.0 - 34.0 pg   MCHC 31.0  30.0 - 36.0 g/dL   RDW 23.0 (*) 11.5 - 15.5 %   Platelets 367  150 - 400 K/uL  GLUCOSE, CAPILLARY     Status: Abnormal   Collection Time    02/16/14 11:37 AM      Result Value Ref Range   Glucose-Capillary 224 (*) 70 - 99 mg/dL  GLUCOSE, CAPILLARY     Status: Abnormal   Collection Time    02/16/14  4:49 PM      Result Value Ref Range   Glucose-Capillary 237 (*) 70 - 99 mg/dL  GLUCOSE, CAPILLARY     Status: Abnormal   Collection Time    02/16/14  8:52 PM      Result Value Ref Range   Glucose-Capillary 231 (*) 70 - 99 mg/dL  COMPREHENSIVE METABOLIC PANEL     Status: Abnormal   Collection Time    02/17/14  6:34 AM      Result Value Ref Range   Sodium 141  137 - 147 mEq/L   Potassium 4.1  3.7  - 5.3 mEq/L   Chloride 103  96 - 112 mEq/L   CO2 24  19 - 32 mEq/L   Glucose, Bld 136 (*) 70 - 99 mg/dL   BUN 41 (*) 6 - 23 mg/dL   Creatinine, Ser 4.36 (*) 0.50 - 1.10 mg/dL   Calcium 8.6  8.4 - 10.5 mg/dL   Total Protein 6.6  6.0 - 8.3 g/dL   Albumin 2.4 (*) 3.5 - 5.2 g/dL   AST 19  0 - 37 U/L   ALT 18  0 - 35 U/L   Alkaline Phosphatase 88  39 - 117 U/L   Total Bilirubin 0.5  0.3 - 1.2 mg/dL   GFR calc non Af Amer 9 (*) >90 mL/min   GFR calc Af Amer 10 (*) >90 mL/min   Comment: (NOTE)     The eGFR has been calculated using the CKD EPI equation.     This calculation has not been validated in all clinical situations.     eGFR's persistently <90 mL/min signify possible Chronic Kidney     Disease.  GLUCOSE, CAPILLARY     Status: Abnormal   Collection Time    02/17/14  7:14 AM      Result Value Ref Range   Glucose-Capillary 133 (*) 70 - 99 mg/dL   Comment 1 Notify RN    GLUCOSE, CAPILLARY     Status: Abnormal   Collection Time    02/17/14 11:17 AM      Result Value Ref Range   Glucose-Capillary 166 (*) 70 - 99 mg/dL   Comment 1 Notify RN    GLUCOSE, CAPILLARY     Status: Abnormal   Collection Time    02/17/14  4:44 PM      Result Value Ref Range   Glucose-Capillary 241 (*) 70 - 99 mg/dL  GLUCOSE, CAPILLARY     Status: Abnormal   Collection Time    02/17/14  9:34 PM      Result Value Ref Range   Glucose-Capillary 229 (*) 70 - 99 mg/dL  GLUCOSE, CAPILLARY  Status: Abnormal   Collection Time    02/18/14  7:37 AM      Result Value Ref Range   Glucose-Capillary 126 (*) 70 - 99 mg/dL     Intake/Output Summary (Last 24 hours) at 02/18/14 0943 Last data filed at 02/17/14 1200  Gross per 24 hour  Intake    240 ml  Output      0 ml  Net    240 ml    Patient Vitals for the past 24 hrs:  BP Temp Temp src Pulse Resp SpO2 Weight  02/18/14 0536 157/53 mmHg 98.7 F (37.1 C) Oral 66 18 98 % 76.2 kg (167 lb 15.9 oz)  02/17/14 2148 170/66 mmHg 99 F (37.2 C) Oral 66 18 97 %  -     Nursing note and vitals reviewed.  Constitutional: She appears well-developed and well-nourished.  HENT:  Head: Normocephalic and atraumatic.  Eyes: Conjunctivae are normal. Pupils are equal, round, and reactive to light.  Neck: Normal range of motion. Neck supple.  Cardiovascular: Normal rate and regular rhythm.  No murmur heard.  Respiratory: Effort normal and breath sounds normal. No respiratory distress. She has no wheezes.  GI: Soft. Bowel sounds are normal. She exhibits no distension.  Musculoskeletal: She exhibits no tenderness.  Extremities.  Plus 1 pedal edema  Neurological: She is alert.  Oriented to self and place,    Assessment/Plan: 1. Functional deficits secondary to bilateral cerebral watershed infarcts which require 3+ hours per day of interdisciplinary therapy in a comprehensive inpatient rehab setting. 2. GIB due to erosive gatritis: Increase PPI to bid as at home.  3.  HTN: Will monitor with tid checks. Continue metoprolol and Imdur.  4.  Acute on CRF: Baseline Cr 1.2-1.7. No improvement of renal failure thus far 5.  ABLA: Will continue to monitor H/H 3 x week. Is slowly improving. Will add iron supplement due to h/o iron deficiency.  6.  DM type 2: Will resume Lantus insulin at dose of 10 units/HS. Monitor BS with ac/hs checks and titrate medications as indicated. Use SSI for elevated BS.  7. Chronic diastolic CHF: compensatedCheck daily weights and monitor for signs of overload. Low salt diet.  8.  NSTEMI due to demand ischemia: treated medically with Imdur, metoprolol and Lipitor. No ASA due to GIB.  9. Mild dementia with sundowning (was functional PTA):                       LOS (Days) 8 A FACE TO FACE EVALUATION WAS PERFORMED  Marletta Lor 02/18/2014, 9:40 AM

## 2014-02-19 ENCOUNTER — Inpatient Hospital Stay (HOSPITAL_COMMUNITY): Payer: Self-pay

## 2014-02-19 ENCOUNTER — Inpatient Hospital Stay (HOSPITAL_COMMUNITY): Payer: Medicare Other | Admitting: Physical Therapy

## 2014-02-19 DIAGNOSIS — IMO0001 Reserved for inherently not codable concepts without codable children: Secondary | ICD-10-CM

## 2014-02-19 DIAGNOSIS — E1165 Type 2 diabetes mellitus with hyperglycemia: Secondary | ICD-10-CM

## 2014-02-19 DIAGNOSIS — N179 Acute kidney failure, unspecified: Secondary | ICD-10-CM

## 2014-02-19 LAB — GLUCOSE, CAPILLARY
Glucose-Capillary: 161 mg/dL — ABNORMAL HIGH (ref 70–99)
Glucose-Capillary: 206 mg/dL — ABNORMAL HIGH (ref 70–99)
Glucose-Capillary: 306 mg/dL — ABNORMAL HIGH (ref 70–99)
Glucose-Capillary: 357 mg/dL — ABNORMAL HIGH (ref 70–99)

## 2014-02-19 NOTE — Progress Notes (Signed)
Physical Therapy Session Note  Patient Details  Name: [name] MRN: [id number] Date of Birth: 1933-04-06  Today's Date: 02/19/2014 Time: 1355-1430 Time Calculation (min): 35 min  Short Term Goals: Week 1:  PT Short Term Goal 1 (Week 1): Pt will transfer supine to edge of bed, edge of bed to supine with min A.  PT Short Term Goal 2 (Week 1): Pt will transfer bed to chair, chair to bed with rolling walker and min A.  PT Short Term Goal 3 (Week 1): Pt will ambulate with rolling walker and min A about 50 feet,  PT Short Term Goal 4 (Week 1): Pt will ascend/descend 2 stiars with B rails and min A.  PT Short Term Goal 5 (Week 1): Pt will propel w/c 50 feet with min A.   Skilled Therapeutic Interventions/Progress Updates:   Pt received sitting in w/c with numerous family members in room. Session focused on safety with functional mobility. Transitioned to therapy gym. Gait training 75 ft x 2 using RW in controlled environment and supervision. Transferred stand pivot w/c <> bed using RW and sit <> supine on flat mat with supervision/verbal cues for safety and sequencing. Pt negotiated up/down curb step using RW x 2 to simulate home environment and min assist, max verbal cues for safety with RW. Pt performed car transfer with supervision and setup/verbal cues for safety with RW and sequencing. W/c propulsion x 50 ft with initial HOH to initiate, then quickly progressed to supervision using BUEs. Pt left sitting in w/c with family present.  Therapy Documentation Precautions:  Precautions Precautions: Fall Precaution Comments: right field cut Restrictions Weight Bearing Restrictions: No Pain:  Denied pain  See FIM for current functional status  Therapy/Group: Individual Therapy  Laretta Alstrom 02/19/2014, 4:36 PM

## 2014-02-19 NOTE — Progress Notes (Signed)
Patient ID: [name], female   DOB: 01-16-1933, 80 y.o.   MRN: [id number]  Patient ID: [name], female   DOB: Jun 30, 1933, 80 y.o.   MRN: [id number]  02/19/2014.  80 y.o. female with h/o CAD, DM type 2 with gastroparesis  and peripheral neuropathy, CHF exacerbation 11/2103, GIB, early dementia who has had recent increase in falls with melena. She was admitted on 04/25 /15 with expressive aphasia, lethargy. She was found to be in acute renal failure and anemic with hgb-5.6. She was treated with IVF and transfused with 2 unit PRBC. Patient with elevated cardiac enzyme that was felt to be multifactorial due to demand ischemia. 2 D echo with EF 40-45%.  Dr. Arlean Hopping consulted for input on acute on chronic renal failure and recommended d/c ace as well as hydration as patient poor HD candidate. Patient continued to be confused with fluctuating mentation and MRI brain done revealing multiple bilateral watershed infarcts. Carotid dopplers without ICA stenosis.  Neurology recommended ASA if GIB resolved.  Dr. Leone Payor consulted for input and EGD done on 02/08/14 Revealing erosive gastritis in gastric antrum. She's cleared to use low dose ASA in two weeks if needed.  Neurology feels that patient with embolic infarcts due to A Fib and Eliquis 2.5 mg bid recommended if CrCL >30.   Past Medical History  Diagnosis Date  . Asthma   . CAD (coronary artery disease)     Pt reports MI in 2006 (no documentation).  Cardiolite in 05/2002 and 07/2006 did not reveal any reversible ischemia.  Pt follows with Dr. Clarene Duke at [facility].  . CHF (congestive heart failure)     EF 25-30% with dilated LV, mild LVH, severe hypokinesis, and mod-severe reduction in RV function  . Osteoporosis   . HYPERTENSION 08/03/2006  . GASTROPARESIS, DIABETIC 08/03/2006  . HYPERLIPIDEMIA 08/03/2006  . OBSTRUCTIVE SLEEP APNEA 01/06/2008  . PERIPHERAL NEUROPATHY 08/03/2006  . GERD 08/03/2006  . LOW BACK PAIN, CHRONIC 08/03/2006  . OSTEOPOROSIS  03/21/2009  . CEREBRAL EMBOLISM, WITH INFARCTION 07/02/2010  . Angina   . Myocardial infarction "2 or 3"  . Pneumonia 02/26/12    "a few times; probably even today"  . Shortness of breath     "all the time"  . DIABETES MELLITUS, TYPE II 11/04/1983  . Blood transfusion 08/2011  . Lower GI bleeding 08/2011 and 01/2014  . Chronic daily headache   . Migraines   . Stroke summer 2011    "made my left hip worse"  . Uterine cancer   . Nonischemic cardiomyopathy 05/2010    Left heart catheterization:2011. Nonobstructive coronary artery disease.  . Pulmonary hypertension   . Hypertension     Subjective/Complaints:  Sleeping this am but easily arousable; no c/os   Objective: Vital Signs: Blood pressure 157/69, pulse 66, temperature 98.4 F (36.9 C), temperature source Oral, resp. rate 18, height 5\' 2"  (1.575 m), weight 77.4 kg (170 lb 10.2 oz), last menstrual period 12/19/1968, SpO2 92.00%. No results found. Results for orders placed during the hospital encounter of 02/10/14 (from the past 72 hour(s))  CBC     Status: Abnormal   Collection Time    02/16/14  9:25 AM      Result Value Ref Range   WBC 8.1  4.0 - 10.5 K/uL   RBC 3.63 (*) 3.87 - 5.11 MIL/uL   Hemoglobin 8.1 (*) 12.0 - 15.0 g/dL   HCT 04/18/14 (*) 76.7 - 01.1 %   MCV 71.9 (*) 78.0 - 100.0  fL   MCH 22.3 (*) 26.0 - 34.0 pg   MCHC 31.0  30.0 - 36.0 g/dL   RDW 23.0 (*) 11.5 - 15.5 %   Platelets 367  150 - 400 K/uL  GLUCOSE, CAPILLARY     Status: Abnormal   Collection Time    02/16/14 11:37 AM      Result Value Ref Range   Glucose-Capillary 224 (*) 70 - 99 mg/dL  GLUCOSE, CAPILLARY     Status: Abnormal   Collection Time    02/16/14  4:49 PM      Result Value Ref Range   Glucose-Capillary 237 (*) 70 - 99 mg/dL  GLUCOSE, CAPILLARY     Status: Abnormal   Collection Time    02/16/14  8:52 PM      Result Value Ref Range   Glucose-Capillary 231 (*) 70 - 99 mg/dL  COMPREHENSIVE METABOLIC PANEL     Status: Abnormal   Collection  Time    02/17/14  6:34 AM      Result Value Ref Range   Sodium 141  137 - 147 mEq/L   Potassium 4.1  3.7 - 5.3 mEq/L   Chloride 103  96 - 112 mEq/L   CO2 24  19 - 32 mEq/L   Glucose, Bld 136 (*) 70 - 99 mg/dL   BUN 41 (*) 6 - 23 mg/dL   Creatinine, Ser 4.36 (*) 0.50 - 1.10 mg/dL   Calcium 8.6  8.4 - 10.5 mg/dL   Total Protein 6.6  6.0 - 8.3 g/dL   Albumin 2.4 (*) 3.5 - 5.2 g/dL   AST 19  0 - 37 U/L   ALT 18  0 - 35 U/L   Alkaline Phosphatase 88  39 - 117 U/L   Total Bilirubin 0.5  0.3 - 1.2 mg/dL   GFR calc non Af Amer 9 (*) >90 mL/min   GFR calc Af Amer 10 (*) >90 mL/min   Comment: (NOTE)     The eGFR has been calculated using the CKD EPI equation.     This calculation has not been validated in all clinical situations.     eGFR's persistently <90 mL/min signify possible Chronic Kidney     Disease.  GLUCOSE, CAPILLARY     Status: Abnormal   Collection Time    02/17/14  7:14 AM      Result Value Ref Range   Glucose-Capillary 133 (*) 70 - 99 mg/dL   Comment 1 Notify RN    GLUCOSE, CAPILLARY     Status: Abnormal   Collection Time    02/17/14 11:17 AM      Result Value Ref Range   Glucose-Capillary 166 (*) 70 - 99 mg/dL   Comment 1 Notify RN    GLUCOSE, CAPILLARY     Status: Abnormal   Collection Time    02/17/14  4:44 PM      Result Value Ref Range   Glucose-Capillary 241 (*) 70 - 99 mg/dL  GLUCOSE, CAPILLARY     Status: Abnormal   Collection Time    02/17/14  9:34 PM      Result Value Ref Range   Glucose-Capillary 229 (*) 70 - 99 mg/dL  GLUCOSE, CAPILLARY     Status: Abnormal   Collection Time    02/18/14  7:37 AM      Result Value Ref Range   Glucose-Capillary 126 (*) 70 - 99 mg/dL  GLUCOSE, CAPILLARY     Status: Abnormal   Collection Time  02/18/14 11:37 AM      Result Value Ref Range   Glucose-Capillary 156 (*) 70 - 99 mg/dL  GLUCOSE, CAPILLARY     Status: Abnormal   Collection Time    02/18/14  4:45 PM      Result Value Ref Range   Glucose-Capillary 193  (*) 70 - 99 mg/dL  GLUCOSE, CAPILLARY     Status: Abnormal   Collection Time    02/18/14  8:47 PM      Result Value Ref Range   Glucose-Capillary 338 (*) 70 - 99 mg/dL   Comment 1 Notify RN    GLUCOSE, CAPILLARY     Status: Abnormal   Collection Time    02/19/14  6:44 AM      Result Value Ref Range   Glucose-Capillary 161 (*) 70 - 99 mg/dL     Intake/Output Summary (Last 24 hours) at 02/19/14 0838 Last data filed at 02/18/14 0845  Gross per 24 hour  Intake    360 ml  Output      0 ml  Net    360 ml    Patient Vitals for the past 24 hrs:  BP Temp Temp src Pulse Resp SpO2 Weight  02/19/14 0529 157/69 mmHg 98.4 F (36.9 C) Oral 66 18 92 % -  02/19/14 0500 - - - - - - 77.4 kg (170 lb 10.2 oz)  02/18/14 2136 161/69 mmHg 97.9 F (36.6 C) Oral 67 18 100 % -  02/18/14 1429 160/73 mmHg 98.6 F (37 C) Oral 67 17 96 % -     Nursing note and vitals reviewed.  Constitutional: She appears well-developed and well-nourished.  HENT:  Head: Normocephalic and atraumatic.  Eyes: Conjunctivae are normal. Pupils are equal, round, and reactive to light.  Neck: Normal range of motion. Neck supple.  Cardiovascular: Normal rate and regular rhythm.  No murmur heard.  Respiratory: Effort normal and breath sounds normal. No respiratory distress. She has no wheezes.  GI: Soft. Bowel sounds are normal. She exhibits no distension.  Musculoskeletal: She exhibits no tenderness.  Extremities.  Plus 1 pedal edema  Neurological: She is alert.  Oriented to self and place,    Assessment/Plan:  1. Functional deficits secondary to bilateral cerebral watershed infarcts which require 3+ hours per day of interdisciplinary therapy in a comprehensive inpatient rehab setting. 2. GIB due to erosive gatritis: Increase PPI to bid as at home.  3.  HTN: Will monitor with tid checks. Continue metoprolol and Imdur.  4.  Acute on CRF: Baseline Cr 1.2-1.7. No improvement of renal failure thus far; will check Bmet in  am 5.  ABLA: Will continue to monitor H/H 3 x week. Is slowly improving. Will add iron supplement due to h/o iron deficiency.  6.  DM type 2: Will resume Lantus insulin at dose of 10 units/HS. Monitor BS with ac/hs checks and titrate medications as indicated. Use SSI for elevated BS.  7. Chronic diastolic CHF: compensatedCheck daily weights and monitor for signs of overload. Low salt diet.  8.  NSTEMI due to demand ischemia: treated medically with Imdur, metoprolol and Lipitor. No ASA due to GIB.  9. Mild dementia with sundowning (was functional PTA):                       LOS (Days) 9 A FACE TO FACE EVALUATION WAS PERFORMED  Marletta Lor 02/19/2014, 8:38 AM

## 2014-02-19 NOTE — Progress Notes (Signed)
Occupational Therapy Note  Patient Details  Name: VIOLA KINNICK MRN: [id number] Date of Birth: 03/10/1933 Today's Date: 02/19/2014  Time: 7371-0626 Pt denied pain Individual therapy  Pt sitting in w/c with numerous family members and friends present.  Pt transitioned to therapy gym and engaged in dynamic standing tasks while using BUE to complete tasks.  Pt stood 4 X 5 mins with rest breaks.  Pt exhibits BLE tremors when standing but is able to maintain balance.  Focus on activity tolerance, sit<>stand, standing balance, and safety awareness to increase independence with BADLs.  Pt returned to room with family present and QRB in place.   Leroy Libman 02/19/2014, 3:45 PM

## 2014-02-20 LAB — COMPREHENSIVE METABOLIC PANEL
ALK PHOS: 85 U/L (ref 39–117)
ALT: 16 U/L (ref 0–35)
AST: 20 U/L (ref 0–37)
Albumin: 2.5 g/dL — ABNORMAL LOW (ref 3.5–5.2)
BILIRUBIN TOTAL: 0.3 mg/dL (ref 0.3–1.2)
BUN: 43 mg/dL — ABNORMAL HIGH (ref 6–23)
CHLORIDE: 105 meq/L (ref 96–112)
CO2: 21 mEq/L (ref 19–32)
Calcium: 8.7 mg/dL (ref 8.4–10.5)
Creatinine, Ser: 4.18 mg/dL — ABNORMAL HIGH (ref 0.50–1.10)
GFR calc non Af Amer: 9 mL/min — ABNORMAL LOW (ref >90)
GFR, EST AFRICAN AMERICAN: 11 mL/min — AB (ref >90)
GLUCOSE: 179 mg/dL — AB (ref 70–99)
POTASSIUM: 4.2 meq/L (ref 3.7–5.3)
Sodium: 143 mEq/L (ref 137–147)
Total Protein: 6.5 g/dL (ref 6.0–8.3)

## 2014-02-20 LAB — GLUCOSE, CAPILLARY: Glucose-Capillary: 147 mg/dL — ABNORMAL HIGH (ref 70–99)

## 2014-02-20 NOTE — Progress Notes (Signed)
Occupational Therapy Discharge Summary  Patient Details  Name: ASTIN RAPE MRN: [id number] Date of Birth: [birth year]  Today's Date: 02/20/2014 Time:  -     Patient has met 5 of 11 long term goals due to improved activity tolerance.  Patient to discharge at [facility] Assist level.  Patient's care partner is independent to provide the necessary physical and cognitive assistance at discharge.    Reasons goals not met: Patient developed UTI and had change in status  Recommendation:  Patient will benefit from ongoing skilled Personal care attendant services in home health setting to continue to advance functional skills in the area of BADL.  Equipment: No equipment provided  Reasons for discharge: discharge from hospital  Patient/family agrees with progress made and goals achieved: Yes     See FIM for current functional status  Mariah Milling 02/20/2014, 5:19 PM

## 2014-02-20 NOTE — Progress Notes (Signed)
Social Work Patient ID: [name], female   DOB: 1933/06/13, 80 y.o.   MRN: [id number] Spoke with [facility] in [place] eligibility to answer questions regarding pt's need for [facility] services.  Asked family's preference and the agency name is [facility]. Informed son of the information and to expect to hear something soon regarding starting PCS services.  He is aware and agreeable.

## 2014-02-20 NOTE — Progress Notes (Signed)
80 y.o. female with h/o CAD, DM type 2 with gastroparesis, CHF exacerbation 11/2103, GIB, early dementia who has had recent increase in falls with melena. She was admitted on 04/25 /15 with expressive aphasia, lethargy as well as twisted lip. She was found to be in acute renal failure and anemic with hgb-5.6. She was treated with IVF and transfused with 2 unit PRBC. Patient with elevated cardiac enzyme that was felt to be multifactorial due to demand ischemia. 2 D echo with EF 40-45%. Dr. Jonnie Finner consulted for input on acute on chronic renal failure and recommended d/c ace as well as hydration as patient poor HD candidate. Patient continued to be confused with fluctuating mentation and MRI brain done revealing multiple bilateral watershed infarcts. Carotid dopplers without ICA stenosis. Neurology recommended ASA if GIB resolved. Dr. Carlean Purl consulted for input and EGD done on 02/08/14 Revealing erosive gastritis in gastric antrum. She's cleared to use low dose ASA in two weeks if needed. Neurology feels that patient with embolic infarcts due to A Fib and Eliquis 2.5 mg bid recommended if CrCL >30.   Subjective/Complaints: Somnolent, opens eyes briefly to voice, Review of Systems - cannot obtain secondary to mental status  Objective: Vital Signs: Blood pressure 171/78, pulse 64, temperature 98.5 F (36.9 C), temperature source Oral, resp. rate 16, height _0  (1.575 m), weight 76.9 kg (169 lb 8.5 oz), last menstrual period 12/19/1968, SpO2 99.00%. No results found. Results for orders placed during the hospital encounter of 02/10/14 (from the past 72 hour(s))  GLUCOSE, CAPILLARY     Status: Abnormal   Collection Time    02/17/14 11:17 AM      Result Value Ref Range   Glucose-Capillary 166 (*) 70 - 99 mg/dL   Comment 1 Notify RN    GLUCOSE, CAPILLARY     Status: Abnormal   Collection Time    02/17/14  4:44 PM      Result Value Ref Range   Glucose-Capillary 241 (*) 70 - 99 mg/dL  GLUCOSE,  CAPILLARY     Status: Abnormal   Collection Time    02/17/14  9:34 PM      Result Value Ref Range   Glucose-Capillary 229 (*) 70 - 99 mg/dL  GLUCOSE, CAPILLARY     Status: Abnormal   Collection Time    02/18/14  7:37 AM      Result Value Ref Range   Glucose-Capillary 126 (*) 70 - 99 mg/dL  GLUCOSE, CAPILLARY     Status: Abnormal   Collection Time    02/18/14 11:37 AM      Result Value Ref Range   Glucose-Capillary 156 (*) 70 - 99 mg/dL  GLUCOSE, CAPILLARY     Status: Abnormal   Collection Time    02/18/14  4:45 PM      Result Value Ref Range   Glucose-Capillary 193 (*) 70 - 99 mg/dL  GLUCOSE, CAPILLARY     Status: Abnormal   Collection Time    02/18/14  8:47 PM      Result Value Ref Range   Glucose-Capillary 338 (*) 70 - 99 mg/dL   Comment 1 Notify RN    GLUCOSE, CAPILLARY     Status: Abnormal   Collection Time    02/19/14  6:44 AM      Result Value Ref Range   Glucose-Capillary 161 (*) 70 - 99 mg/dL  GLUCOSE, CAPILLARY     Status: Abnormal   Collection Time    02/19/14 11:26 AM  Result Value Ref Range   Glucose-Capillary 206 (*) 70 - 99 mg/dL  GLUCOSE, CAPILLARY     Status: Abnormal   Collection Time    02/19/14  4:29 PM      Result Value Ref Range   Glucose-Capillary 306 (*) 70 - 99 mg/dL  GLUCOSE, CAPILLARY     Status: Abnormal   Collection Time    02/19/14  8:38 PM      Result Value Ref Range   Glucose-Capillary 357 (*) 70 - 99 mg/dL  COMPREHENSIVE METABOLIC PANEL     Status: Abnormal   Collection Time    02/20/14  5:48 AM      Result Value Ref Range   Sodium 143  137 - 147 mEq/L   Potassium 4.2  3.7 - 5.3 mEq/L   Chloride 105  96 - 112 mEq/L   CO2 21  19 - 32 mEq/L   Glucose, Bld 179 (*) 70 - 99 mg/dL   BUN 43 (*) 6 - 23 mg/dL   Creatinine, Ser 4.18 (*) 0.50 - 1.10 mg/dL   Calcium 8.7  8.4 - 10.5 mg/dL   Total Protein 6.5  6.0 - 8.3 g/dL   Albumin 2.5 (*) 3.5 - 5.2 g/dL   AST 20  0 - 37 U/L   ALT 16  0 - 35 U/L   Alkaline Phosphatase 85  39 - 117  U/L   Total Bilirubin 0.3  0.3 - 1.2 mg/dL   GFR calc non Af Amer 9 (*) >90 mL/min   GFR calc Af Amer 11 (*) >90 mL/min   Comment: (NOTE)     The eGFR has been calculated using the CKD EPI equation.     This calculation has not been validated in all clinical situations.     eGFR's persistently <90 mL/min signify possible Chronic Kidney     Disease.  GLUCOSE, CAPILLARY     Status: Abnormal   Collection Time    02/20/14  7:18 AM      Result Value Ref Range   Glucose-Capillary 147 (*) 70 - 99 mg/dL   Comment 1 Notify RN        Nursing note and vitals reviewed.  Constitutional: She appears well-developed and well-nourished.  HENT:  Head: Normocephalic and atraumatic.  Eyes: Conjunctivae are normal. Pupils are equal, round, and reactive to light.  Neck: Normal range of motion. Neck supple.  Cardiovascular: Normal rate and regular rhythm.  No murmur heard.  Respiratory: Effort normal and breath sounds normal. No respiratory distress. She has no wheezes.  GI: Soft. Bowel sounds are normal. She exhibits no distension.  Musculoskeletal: She exhibits no tenderness.    Skin: Skin is warm and dry.  needs cues for limited orientation       Assessment/Plan: 1. Functional deficits secondary to bilateral cerebral watershed infarcts which require 3+ hours per day of interdisciplinary therapy in a comprehensive inpatient rehab setting. Physiatrist is providing close team supervision and 24 hour management of active medical problems listed below. Physiatrist and rehab team continue to assess barriers to discharge/monitor patient progress toward functional and medical goals.  No major improvements noted in therapy after switch to cipro for UTI, still at [place] A/Sup, medically stable for D/C  FIM: FIM - Bathing Bathing Steps Patient Completed: Chest;Right Arm;Left Arm;Abdomen;Front perineal area;Buttocks;Right upper leg;Left upper leg Bathing: 4: Min-Patient completes 8-9 [id number] parts or 75+  percent  FIM - Upper Body Dressing/Undressing Upper body dressing/undressing steps patient completed: Thread/unthread left sleeve  of pullover shirt/dress;Thread/unthread right sleeve of pullover shirt/dresss;Put head through opening of pull over shirt/dress;Pull shirt over trunk;Thread/unthread right sleeve of front closure shirt/dress;Thread/unthread left sleeve of front closure shirt/dress Upper body dressing/undressing: 5: Set-up assist to: Obtain clothing/put away FIM - Lower Body Dressing/Undressing Lower body dressing/undressing steps patient completed: Thread/unthread right pants leg;Thread/unthread left pants leg;Pull pants up/down Lower body dressing/undressing: 4: Min-Patient completed 75 plus % of tasks  FIM - Toileting Toileting steps completed by patient: Adjust clothing prior to toileting;Performs perineal hygiene;Adjust clothing after toileting Toileting Assistive Devices: Grab bar or rail for support Toileting: 2: Max-Patient completed 1 of 3 steps  FIM - Radio producer Devices: Bedside commode Toilet Transfers: 3-To toilet/BSC: Mod A (lift or lower assist);3-From toilet/BSC: Mod A (lift or lower assist)  FIM - Bed/Chair Transfer Bed/Chair Transfer Assistive Devices: Arm rests;Walker Bed/Chair Transfer: 5: Supine > Sit: Supervision (verbal cues/safety issues);5: Sit > Supine: Supervision (verbal cues/safety issues);5: Bed > Chair or W/C: Supervision (verbal cues/safety issues);5: Chair or W/C > Bed: Supervision (verbal cues/safety issues)  FIM - Locomotion: Wheelchair Distance: 50 Locomotion: Wheelchair: 2: Travels 50 - 149 ft with supervision, cueing or coaxing FIM - Locomotion: Ambulation Locomotion: Ambulation Assistive Devices: Administrator Ambulation/Gait Assistance: 5: Supervision Locomotion: Ambulation: 2: Travels 50 - 149 ft with supervision/safety issues  Comprehension Comprehension Mode: Auditory Comprehension: 2-Understands  basic 25 - 49% of the time/requires cueing 51 - 75% of the time  Expression Expression Mode: Verbal Expression: 2-Expresses basic 25 - 49% of the time/requires cueing 50 - 75% of the time. Uses single words/gestures.  Social Interaction Social Interaction: 2-Interacts appropriately 25 - 49% of time - Needs frequent redirection.  Problem Solving Problem Solving: 1-Solves basic less than 25% of the time - needs direction nearly all the time or does not effectively solve problems and may need a restraint for safety  Memory Memory: 1-Recognizes or recalls less than 25% of the time/requires cueing greater than 75% of the time  Medical Problem List and Plan:  1. Functional deficits secondary to Bilateral cerebral watershed infarcts related to acute anemia from GIB  2. DVT Prophylaxis/Anticoagulation: Mechanical: Sequential compression devices, below knee Bilateral lower extremities  3. Pain Management: Tylenol prn for pain.  4. Mood: No signs of distress. LCSW to follow with patient and family for evaluation and support.  5. Neuropsych: This patient is not capable of making decisions on her own behalf.  6. GIB due to erosive gatritis: Increase PPI to bid as at home.  7. HTN: Will monitor with tid checks. Continue metoprolol and Imdur.  8. Acute on CRF: Baseline Cr 1.2-1.7. No improvement of renal failure thus far 9. ABLA: Will continue to monitor H/H 3 x week. Is slowly improving. Will add iron supplement due to h/o iron deficiency.  10 DM type 2: Will resume Lantus insulin at dose of 10 units/HS. Monitor BS with ac/hs checks and titrate medications as indicated. Use SSI for elevated BS.  11. Chronic diastolic CHF: compensatedCheck daily weights and monitor for signs of overload. Low salt diet.  12 NSTEMI due to demand ischemia: treated medically with Imdur, metoprolol and Lipitor. No ASA due to GIB.  13. Mild dementia with sundowning (was functional PTA):                            14.  E coli  UTI  Resistant to  keflex now on ciprofloxacin will complete 5 day tx  LOS (Days) [place] EVALUATION WAS PERFORMED  Charlett Blake 02/20/2014, 8:06 AM

## 2014-02-20 NOTE — Progress Notes (Signed)
Social Work Discharge Note Discharge Note  The overall goal for the admission was met for:   Discharge location: Yes-HOME WITH SON WHO REPORTS WILL PROVIDE [address] [place] FROM HIS SISTER'S  Length of Stay: Yes-10 DAYS  Discharge activity level: Yes-SUPERVISION/MIN LEVEL  Home/community participation: Yes  Services provided included: MD, RD, PT, OT, SLP, RN, CM, Pharmacy and SW  Financial Services: Medicare and Medicaid  Follow-up services arranged: Home Health: [facility]-PT,OT,SPT,RN,CNA, DME: [place] and Patient/Family has no preference for HH/DME agencies  Comments (or additional information):FAMILY EDUCATION SOMEWHAT COMPLETED WITH DAUGHTER'S -SON WAS RESISTANT AND FELT HIS SISTER'S WOULD BE PROVIDING PERSONAL CARE  TO PT, ALTHOUGH HE IS THE MAIN ONE WITH HER.  WILL ASK HOME HEALTH TO REINFORCE EDUCATION  Patient/Family verbalized understanding of follow-up arrangements: Yes  Individual responsible for coordination of the follow-up plan: [name] & PAT-DAUGHTER  Confirmed correct DME delivered: Elease Hashimoto 02/20/2014    Gardiner Rhyme Fallon Haecker

## 2014-02-22 ENCOUNTER — Encounter: Payer: Self-pay | Admitting: Internal Medicine

## 2014-02-22 NOTE — Plan of Care (Signed)
Problem: RH Stairs Goal: LTG Patient will ambulate up and down stairs w/assist (PT) LTG: Patient will ambulate up and down # of stairs with assistance (PT)  Outcome: Not Met (add Reason) Change in status secondary to UTI

## 2014-02-22 NOTE — Progress Notes (Signed)
Physical Therapy Discharge Summary  Patient Details  Name: [name] MRN: [id number] Date of Birth: April 28, 1933  Today's Date: 02/22/2014 Time:  N/A; no billable services provided  Patient has met 9 of 10 long term goals due to improved activity tolerance, improved balance, improved postural control, increased strength and ability to compensate for deficits.  Patient to discharge at an ambulatory level Supervision for household mobility. Patient's care partner is independent to provide the necessary physical and cognitive assistance at discharge.  Reasons goals not met: Long term goal addressing stair negotiation not met secondary to status change with UTI. However, stair negotiation adequate for discharge, as pt is able to afely negotiate 2 steps to enter home using rolling walker with min A of son.  Recommendation:  Patient will benefit from ongoing skilled PT services in home health setting to continue to advance safe functional mobility, address ongoing impairments in stability/independence with functional mobility and minimize fall risk.  Equipment: 18"x16" wheelchair and cushion (ultra hemi height)  Reasons for discharge: treatment goals met and discharge from hospital  Patient/family agrees with progress made and goals achieved: Yes  PT Discharge Vision/Perception  Vision - Assessment Eye Alignment: Within Functional Limits  Cognition Overall Cognitive Status: Impaired/Different from baseline Arousal/Alertness: Lethargic Orientation Level: Oriented to person;Disoriented to place;Disoriented to time;Disoriented to situation Attention: Focused Focused Attention: Impaired Focused Attention Impairment: Verbal basic Sustained Attention: Impaired Sustained Attention Impairment: Verbal basic;Functional basic Memory: Impaired Memory Impairment: Storage deficit;Decreased recall of new information Decreased Short Term Memory: Verbal basic;Functional basic Awareness:  Impaired Awareness Impairment: Intellectual impairment Problem Solving: Impaired Problem Solving Impairment: Verbal basic;Functional basic Executive Function:  (all impaired due to lower level deficits) Sequencing: Impaired Sequencing Impairment: Verbal basic;Functional basic Organizing: Impaired Organizing Impairment: Verbal basic Decision Making: Impaired Decision Making Impairment: Verbal basic;Functional basic Initiating: Impaired Initiating Impairment: Verbal basic Self Monitoring: Impaired Self Monitoring Impairment: Verbal basic;Functional basic Self Correcting: Impaired Self Correcting Impairment: Verbal basic;Functional basic Behaviors: Perseveration Safety/Judgment: Impaired Comments: Patient was extremely lethargic and difficulty with initiating and maintaining attention.  Sensation Sensation Light Touch: Impaired by gross assessment Proprioception: Appears Intact Additional Comments: Light touch impaired in bilat LE's Motor  Motor Motor: Abnormal postural alignment and control  Mobility Bed Mobility Bed Mobility: Supine to Sit;Sit to Supine Supine to Sit: 5: Supervision;HOB flat Sit to Supine: HOB flat;5: Supervision Transfers Transfers: Yes Sit to Stand: 5: Supervision;With armrests;From bed;From chair/3-in-1 Sit to Stand Details: Tactile cues for initiation Sit to Stand Details (indicate cue type and reason): with rolling walker Stand to Sit: 5: Supervision;With armrests;To chair/3-in-1;To bed Stand to Sit Details (indicate cue type and reason): Tactile cues for initiation Stand to Sit Details: with rolling walker Stand Pivot Transfers: 5: Supervision;With armrests Stand Pivot Transfer Details: Tactile cues for initiation;Verbal cues for precautions/safety;Verbal cues for safe use of DME/AE Stand Pivot Transfer Details (indicate cue type and reason): with rolling walker Locomotion  Ambulation Ambulation: Yes Ambulation/Gait Assistance: 5:  Supervision Ambulation Distance (Feet): 75 Feet Assistive device: Rolling walker Ambulation/Gait Assistance Details: Verbal cues for safe use of DME/AE;Verbal cues for precautions/safety Ambulation/Gait Assistance Details: Verbal cueing for sustained attention to task, for obstacle negotiation, and for safe use of rolling walker. Gait Gait: Yes Gait Pattern: Impaired Gait Pattern: Step-through pattern;Decreased step length - left;Decreased step length - right;Decreased hip/knee flexion - right;Decreased hip/knee flexion - left;Decreased dorsiflexion - right;Decreased dorsiflexion - left;Trunk flexed Stairs / Additional Locomotion Stairs: Yes Stairs Assistance: 4: Min assist Stair Management Technique: Two rails Number of  Stairs: 2 Ramp: 4: Min assist;Other (comment) (with rolling walker) Curb: 4: Min assist (with rolling walker) Wheelchair Mobility Wheelchair Mobility: Yes Wheelchair Assistance: 5: Supervision Wheelchair Assistance Details: Tactile cues for initiation;Verbal cues for technique;Tactile cues for placement Wheelchair Propulsion: Both upper extremities Wheelchair Parts Management: Needs assistance Distance: 50  Trunk/Postural Assessment  Cervical Assessment Cervical Assessment: Within Functional Limits Thoracic Assessment Thoracic Assessment: Within Functional Limits Lumbar Assessment Lumbar Assessment: Within Functional Limits Postural Control Postural Control: Deficits on evaluation Postural Limitations: Posterior pelvic tilt in seated; limted bilat hip extension in standing.  Balance Balance Balance Assessed: Yes Static Sitting Balance Static Sitting - Balance Support: Feet supported;No upper extremity supported Static Sitting - Level of Assistance: 5: Stand by assistance Static Sitting - Comment/# of Minutes: SBA required secondary to decreased safety awareness Dynamic Sitting Balance Dynamic Sitting - Balance Support: During functional activity;Feet  supported;No upper extremity supported;Left upper extremity supported Dynamic Sitting - Level of Assistance: 5: Stand by assistance Dynamic Sitting - Balance Activities: Reaching for objects;Lateral lean/weight shifting;Reaching across midline Sitting balance - Comments: Dynamic sitting balance >3 minutes during toileting with intermittent LUE support at grab bar with SBA. Static Standing Balance Static Standing - Balance Support: During functional activity;Bilateral upper extremity supported Static Standing - Level of Assistance: 5: Stand by assistance Static Standing - Comment/# of Minutes: >60 seconds with bilat UE support at rolling walker with no overt LOB. Dynamic Standing Balance Dynamic Standing - Balance Support: During functional activity;Left upper extremity supported;No upper extremity supported (intermittent LUE support) Dynamic Standing - Level of Assistance: 5: Stand by assistance Dynamic Standing - Balance Activities: Reaching for objects;Forward lean/weight shifting;Lateral lean/weight shifting Dynamic Standing - Comments: Dynamic standing balance x2 minutes during hand washing with intermittent LUE support. Extremity Assessment  RLE Assessment RLE Assessment: Exceptions to [facility] RLE Strength RLE Overall Strength: Deficits RLE Overall Strength Comments: Based on observation of functional movement, grossly 2+/5 to 3-/5 LLE Assessment LLE Assessment: Exceptions to [facility] LLE AROM (degrees) Overall AROM Left Lower Extremity: Deficits LLE Strength LLE Overall Strength Comments: Based on observation of functional mobility, grossly 3+/5 to 4-/5  See FIM for current functional status  Benjie Karvonen A Louisa Favaro 02/22/2014, 7:38 PM

## 2014-02-28 NOTE — Discharge Summary (Addendum)
Physician Discharge Summary  Patient ID: [name] MRN: [id number] DOB/AGE: 1933/06/06 80 y.o.  Admit date: 02/10/2014 Discharge date: 02/20/2014  Discharge Diagnoses:  Principal Problem:   CVA (cerebral infarction) Active Problems:   GASTROPARESIS, DIABETIC   HYPERTENSION   Chronic diastolic congestive heart failure   Memory difficulties   Atrial fibrillation   E. coli UTI   Discharged Condition: Stable.     Labs:  Basic Metabolic Panel:    Component Value Date/Time   NA 143 02/20/2014 0548   K 4.2 02/20/2014 0548   CL 105 02/20/2014 0548   CO2 21 02/20/2014 0548   GLUCOSE 179* 02/20/2014 0548   BUN 43* 02/20/2014 0548   CREATININE 4.18* 02/20/2014 0548   CREATININE 1.07 03/02/2012 1026   CALCIUM 8.7 02/20/2014 0548   GFRNONAA 9* 02/20/2014 0548   GFRNONAA 39* 09/03/2011 1404   GFRAA 11* 02/20/2014 0548   GFRAA 45* 09/03/2011 1404      CBC:     Component Value Date/Time   WBC 8.1 02/16/2014 0925   RBC 3.63* 02/16/2014 0925   RBC 2.85* 02/04/2014 1404   HGB 8.1* 02/16/2014 0925   HCT 26.1* 02/16/2014 0925   PLT 367 02/16/2014 0925   MCV 71.9* 02/16/2014 0925   MCH 22.3* 02/16/2014 0925   MCHC 31.0 02/16/2014 0925   RDW 23.0* 02/16/2014 0925   LYMPHSABS 2.0 02/04/2014 1158   MONOABS 0.8 02/04/2014 1158   EOSABS 0.0 02/04/2014 1158   BASOSABS 0.0 02/04/2014 1158      CBG: No results found for this basename: GLUCAP,  in the last 168 hours  Brief HPI:   [name] is a 80 y.o. female with h/o CAD, DM type 2 with gastroparesis, CHF, GIB, early dementia who has had recent increase in falls with melena. She was admitted on 04/25 /15 with expressive aphasia, lethargy and was found to be in acute renal failure and anemic with hgb-5.6. She was treated with IVF and transfused with 2 unit PRBC. Patient with elevated cardiac enzyme that was felt to be multifactorial due to demand ischemia. Dr. Jonnie Finner with nephrology  recommended d/c ace as well as hydration as patient poor HD candidate.  MRI of brain was done due to continued confused and lethargy state. This revealed multiple bilateral watershed infarcts.  Dr. Carlean Purl consulted for input and EGD done on 02/08/14 revealing erosive gastritis in gastric antrum. She's cleared to use low dose ASA in two weeks if needed. Neurology feels that patient with embolic infarcts due to A Fib and Eliquis 2.5 mg bid recommended if CrCL >30. She has had improvement in mentation but continues with subsequent right neglect, moderate dysarthria, problems with initiation, attention as well as aphasia. CIR recommended for follow up therapies.    Hospital Course: [name] was admitted to rehab 02/10/2014 for inpatient therapies to consist of PT, ST and OT at least three hours five days a week. Past admission physiatrist, therapy team and rehab RN have worked together to provide customized collaborative inpatient rehab. Blood pressures were reasonably controlled. Daily weights were done with close monitoring for signs of overload. Renal status has been monitored with routine checks and is showing slow improvement. H/H is has stabilized at 8.1 to 8.3 range and no signs of recurrent bleed during this stay. Iron supplement was added due to h/o iron deficiency and PPI was increased to BID as at home. Family was advised to initiate low dose ASA in a week past discharge.  Sundowning  has resolved. Urine culture done showed  >100,000 colonies of E coli and she was treated was started on cipro for treatment. Lantus insulin was resumed at admission and diabetes was monitored with ac/hs cbg checks. Patient has progressed to min assist level and family was educated on assistance needed past discharge. [facility] to provide HHPT, HHOT, HHST HHRN as well as CNA. LCSW has assisted family with application  for PCS aide.     Rehab course: During patient's stay in rehab weekly team conferences were held to monitor patient's progress, set goals and discuss barriers to  discharge. Patient has had improvement in activity tolerance, balance, postural control, as well as ability to compensate for deficits. She requires min assist for bathing, dressing and personal care tasks. She requires supervision for transfers and mobility. Cognition remains impaired and she continues to require max assistance with basic comprehension, verbal expression, and problem solving in part due to poor motivation for task completion.  Family to provide assistance needed past discharge.    Disposition: 06-Home-Health Care Svc  Diet: Soft diet. Low salt.   Special Instructions: 1. Start ASA 81 mg in one week.     Medication List    STOP taking these medications       aspirin 81 MG EC tablet     carvedilol 3.125 MG tablet  Commonly known as:  COREG     lisinopril 5 MG tablet  Commonly known as:  PRINIVIL,ZESTRIL     NOVOLOG FLEXPEN 100 UNIT/ML FlexPen  Generic drug:  insulin aspart     potassium chloride 10 MEQ tablet  Commonly known as:  K-DUR     rosuvastatin 20 MG tablet  Commonly known as:  CRESTOR     torsemide 10 MG tablet  Commonly known as:  DEMADEX      TAKE these medications       acetaminophen 500 MG tablet  Commonly known as:  TYLENOL  Take 500 mg by mouth every 6 (six) hours as needed for moderate pain or headache.     albuterol 108 (90 BASE) MCG/ACT inhaler  Commonly known as:  PROAIR HFA  Inhale 2 puffs into the lungs every 6 (six) hours as needed for wheezing.     ALIVE WOMENS 50+ PO  Take 1 tablet by mouth daily.     ALLERGY RELIEF 10 MG tablet  Generic drug:  loratadine  Take 10 mg by mouth daily.     amLODipine 10 MG tablet  Commonly known as:  NORVASC  Take 1 tablet (10 mg total) by mouth daily.     atorvastatin 40 MG tablet  Commonly known as:  LIPITOR  Take 1 tablet (40 mg total) by mouth daily at 6 PM.     BD INSULIN SYRINGE ULTRAFINE 31G X 5/16" 0.3 ML Misc  Generic drug:  Insulin Syringe-Needle U-100     ciprofloxacin 250  MG tablet  Commonly known as:  CIPRO  Take 1 tablet (250 mg total) by mouth daily with breakfast.     insulin glargine 100 UNIT/ML injection  Commonly known as:  LANTUS  Inject 0.1 mLs (10 Units total) into the skin at bedtime.     isosorbide mononitrate 60 MG 24 hr tablet  Commonly known as:  IMDUR  Take 1 tablet (60 mg total) by mouth daily.     metoprolol succinate 25 MG 24 hr tablet  Commonly known as:  TOPROL-XL  Take 1 tablet (25 mg total) by mouth daily.  nitroGLYCERIN 0.4 MG SL tablet  Commonly known as:  NITROSTAT  Place 1 tablet (0.4 mg total) under the tongue every 5 (five) minutes as needed. For chest pain.     pantoprazole 40 MG tablet  Commonly known as:  PROTONIX  Take 1 tablet (40 mg total) by mouth 2 (two) times daily.     polyethylene glycol packet  Commonly known as:  MIRALAX / GLYCOLAX  Take 17 g by mouth daily.       Follow-up Information   Follow up with Charlett Blake, MD. (Followup 03/23/2014 arrival 12:45 PM)    Specialty:  Physical Medicine and Rehabilitation   Contact information:   [address] [place] [address] [phone]       Follow up with Lorretta Harp, MD. (Call for appointment 2 weeks)    Specialty:  Cardiology   Contact information:   [address] [place] [address] [phone]       Follow up with Forbes Cellar, MD. (call for appointment 1 month)    Specialties:  Neurology, Radiology   Contact information:   [place] [address] [phone]       Follow up with Silvano Rusk, MD. (Call for appointment)    Specialty:  Gastroenterology   Contact information:   [address] [place] [address] [phone]       Follow up with [name], MD On 02/23/2014. (APPT @ 3;15 PM)    Specialty:  Family Medicine   Contact information:   [address] [place] [address] [phone]       Signed: Bary Leriche 03/02/2014, 6:11  PM

## 2014-03-01 ENCOUNTER — Telehealth: Payer: Self-pay

## 2014-03-01 NOTE — Telephone Encounter (Signed)
Wes a physical therapist with [facility] called to get order to extend therapy.  Verbal order given to extend therapy.

## 2014-03-02 DIAGNOSIS — B9620 Unspecified Escherichia coli [E. coli] as the cause of diseases classified elsewhere: Secondary | ICD-10-CM | POA: Diagnosis not present

## 2014-03-02 DIAGNOSIS — N390 Urinary tract infection, site not specified: Secondary | ICD-10-CM

## 2014-03-02 HISTORY — DX: Urinary tract infection, site not specified: N39.0

## 2014-03-02 HISTORY — DX: Urinary tract infection, site not specified: B96.20

## 2014-03-10 ENCOUNTER — Ambulatory Visit: Payer: Self-pay | Admitting: Cardiovascular Disease

## 2014-03-13 ENCOUNTER — Ambulatory Visit (INDEPENDENT_AMBULATORY_CARE_PROVIDER_SITE_OTHER): Payer: Medicare Other | Admitting: Cardiology

## 2014-03-13 ENCOUNTER — Encounter (HOSPITAL_COMMUNITY): Payer: Self-pay | Admitting: *Deleted

## 2014-03-13 ENCOUNTER — Encounter: Payer: Self-pay | Admitting: Cardiology

## 2014-03-13 VITALS — BP 140/70 | HR 64 | Ht 62.0 in | Wt 169.0 lb

## 2014-03-13 DIAGNOSIS — I6340 Cerebral infarction due to embolism of unspecified cerebral artery: Secondary | ICD-10-CM

## 2014-03-13 DIAGNOSIS — I480 Paroxysmal atrial fibrillation: Secondary | ICD-10-CM

## 2014-03-13 DIAGNOSIS — I493 Ventricular premature depolarization: Secondary | ICD-10-CM

## 2014-03-13 DIAGNOSIS — I2510 Atherosclerotic heart disease of native coronary artery without angina pectoris: Secondary | ICD-10-CM

## 2014-03-13 DIAGNOSIS — IMO0002 Reserved for concepts with insufficient information to code with codable children: Secondary | ICD-10-CM

## 2014-03-13 DIAGNOSIS — M6281 Muscle weakness (generalized): Secondary | ICD-10-CM

## 2014-03-13 DIAGNOSIS — I4891 Unspecified atrial fibrillation: Secondary | ICD-10-CM

## 2014-03-13 DIAGNOSIS — I509 Heart failure, unspecified: Secondary | ICD-10-CM

## 2014-03-13 DIAGNOSIS — R269 Unspecified abnormalities of gait and mobility: Secondary | ICD-10-CM

## 2014-03-13 DIAGNOSIS — I214 Non-ST elevation (NSTEMI) myocardial infarction: Secondary | ICD-10-CM

## 2014-03-13 DIAGNOSIS — I499 Cardiac arrhythmia, unspecified: Secondary | ICD-10-CM

## 2014-03-13 DIAGNOSIS — I69998 Other sequelae following unspecified cerebrovascular disease: Secondary | ICD-10-CM

## 2014-03-13 DIAGNOSIS — I498 Other specified cardiac arrhythmias: Secondary | ICD-10-CM

## 2014-03-13 DIAGNOSIS — I639 Cerebral infarction, unspecified: Secondary | ICD-10-CM

## 2014-03-13 DIAGNOSIS — E1165 Type 2 diabetes mellitus with hyperglycemia: Secondary | ICD-10-CM

## 2014-03-13 DIAGNOSIS — N179 Acute kidney failure, unspecified: Secondary | ICD-10-CM

## 2014-03-13 DIAGNOSIS — K922 Gastrointestinal hemorrhage, unspecified: Secondary | ICD-10-CM

## 2014-03-13 DIAGNOSIS — I6350 Cerebral infarction due to unspecified occlusion or stenosis of unspecified cerebral artery: Secondary | ICD-10-CM

## 2014-03-13 DIAGNOSIS — D649 Anemia, unspecified: Secondary | ICD-10-CM

## 2014-03-13 DIAGNOSIS — IMO0001 Reserved for inherently not codable concepts without codable children: Secondary | ICD-10-CM

## 2014-03-13 DIAGNOSIS — I69959 Hemiplegia and hemiparesis following unspecified cerebrovascular disease affecting unspecified side: Secondary | ICD-10-CM

## 2014-03-13 DIAGNOSIS — E119 Type 2 diabetes mellitus without complications: Secondary | ICD-10-CM

## 2014-03-13 DIAGNOSIS — I4949 Other premature depolarization: Secondary | ICD-10-CM

## 2014-03-13 LAB — COMPREHENSIVE METABOLIC PANEL
ALBUMIN: 3.5 g/dL (ref 3.5–5.2)
ALT: 11 U/L (ref 0–35)
AST: 14 U/L (ref 0–37)
Alkaline Phosphatase: 91 U/L (ref 39–117)
BUN: 45 mg/dL — AB (ref 6–23)
CALCIUM: 9 mg/dL (ref 8.4–10.5)
CHLORIDE: 107 meq/L (ref 96–112)
CO2: 24 meq/L (ref 19–32)
CREATININE: 2.75 mg/dL — AB (ref 0.50–1.10)
GLUCOSE: 215 mg/dL — AB (ref 70–99)
POTASSIUM: 3.7 meq/L (ref 3.5–5.3)
Sodium: 140 mEq/L (ref 135–145)
Total Bilirubin: 0.6 mg/dL (ref 0.2–1.2)
Total Protein: 6.8 g/dL (ref 6.0–8.3)

## 2014-03-13 LAB — CBC
HEMATOCRIT: 27 % — AB (ref 36.0–46.0)
HEMOGLOBIN: 8.5 g/dL — AB (ref 12.0–15.0)
MCH: 22.6 pg — ABNORMAL LOW (ref 26.0–34.0)
MCHC: 31.5 g/dL (ref 30.0–36.0)
MCV: 71.8 fL — AB (ref 78.0–100.0)
Platelets: 300 10*3/uL (ref 150–400)
RBC: 3.76 MIL/uL — ABNORMAL LOW (ref 3.87–5.11)
RDW: 25.4 % — AB (ref 11.5–15.5)
WBC: 7.7 10*3/uL (ref 4.0–10.5)

## 2014-03-13 LAB — MAGNESIUM: MAGNESIUM: 1.5 mg/dL (ref 1.5–2.5)

## 2014-03-13 NOTE — Assessment & Plan Note (Addendum)
During a recent hospitalization in April and May at times she appeared to be in atrial fibrillation though on EKGs there were P waves visible. Due to GI bleed she was not placed on anticoagulation, she is to start an aspirin daily. On 81 mg of aspirin.  We will have her wear a monitor for 2 weeks event monitor to evaluate A. fib burden if there aren't any as well as she is bigeminy PVCs here today.

## 2014-03-13 NOTE — Assessment & Plan Note (Signed)
Bigeminy PVCs today we'll check a basic metabolic panel, comprehensive metabolic panel as well as a magnesium level.  She'll wear an event monitor for 2 weeks.

## 2014-03-13 NOTE — Progress Notes (Addendum)
03/17/2014   PCP: [name], MD   Chief Complaint  Patient presents with  . Follow-up    post hospital with NSTEMI     Primary Cardiologist:Dr. Adora Fridge   HPI:  80 year old thin appearing widowed African American female mother of 7 living children accompanied by one of her daughters. She has been seen by Dr. Aldona Bar in the past. She's a history of nonischemic myopathy documented by cardiac catheterization performed at Dr. Debara Pickett 05/24/10. She had minimal CAD with an ejection fraction of 20%. Her other problems include  hypertension, hyperlipidemia and diabetes. She currently has a stroke in the past and walks with the aid of a walker. She was recently recently hospitalized from 2/21 to the 23rd of Feburary with congestive heart failure. Pro BNP was 7000 and she was treated.   She is back for followup after another hospitalization from 02/04/2014 to 02/10/2014.  She had an acute stroke possible watershed infarct versus embolic strokes. She is slowly improving from this. She also had acute blood loss anemia and is only on aspirin at this time. She had non-ST elevation MI likely type II with history of minimal coronary artery disease. Our plan will be to do a stress test in the next 4-6 weeks.  EF on recent 2-D echo was 40-45% she is on a beta blocker but her 80s has been held due to acute kidney injury.  Her diabetes has been stable since she was discharged with occasional hypoglycemia treated with food.  At times during hospitalization it was thought the patient was in atrial fib review of EKGs no atrial fib with actually seen. There is concern with her CVA that she may need anticoagulation. For now we will resume her aspirin.  She has no chest pain no shortness of breath and no awareness of palpitations  Allergies  Allergen Reactions  . Baking Soda-Fluoride [Sodium Fluoride] Nausea And Vomiting  . Magnesium Hydroxide Nausea And Vomiting    Current Outpatient  Prescriptions  Medication Sig Dispense Refill  . acetaminophen (TYLENOL) 500 MG tablet Take 500 mg by mouth every 6 (six) hours as needed for moderate pain or headache.      . albuterol (PROAIR HFA) 108 (90 BASE) MCG/ACT inhaler Inhale 2 puffs into the lungs every 6 (six) hours as needed for wheezing.  8.5 g  11  . amLODipine (NORVASC) 10 MG tablet Take 1 tablet (10 mg total) by mouth daily.  30 tablet  2  . atorvastatin (LIPITOR) 40 MG tablet Take 1 tablet (40 mg total) by mouth daily at 6 PM.  30 tablet  0  . insulin glargine (LANTUS) 100 UNIT/ML injection Inject 0.1 mLs (10 Units total) into the skin at bedtime.  10 mL  11  . isosorbide mononitrate (IMDUR) 60 MG 24 hr tablet Take 1 tablet (60 mg total) by mouth daily.  30 tablet  2  . KLOR-CON 10 10 MEQ tablet Take 1 tablet by mouth every other day.      . loratadine (ALLERGY RELIEF) 10 MG tablet Take 10 mg by mouth daily.      . metoprolol succinate (TOPROL-XL) 25 MG 24 hr tablet Take 1 tablet (25 mg total) by mouth daily.  30 tablet  0  . Multiple Vitamins-Minerals (ALIVE WOMENS 50+ PO) Take 1 tablet by mouth daily.      . polyethylene glycol (MIRALAX / GLYCOLAX) packet Take 17 g by mouth daily.      Marland Kitchen  torsemide (DEMADEX) 10 MG tablet Take 5 tablets by mouth daily.      . [DISCONTINUED] pantoprazole (PROTONIX) 40 MG tablet Take 1 tablet (40 mg total) by mouth 2 (two) times daily.  60 tablet  0  . aspirin 81 MG tablet Take 81 mg by mouth daily.      . ferrous sulfate 325 (65 FE) MG tablet Take 1 tablet (325 mg total) by mouth 2 (two) times daily with a meal.  60 tablet  5  . lisinopril (PRINIVIL,ZESTRIL) 5 MG tablet Take 5 mg by mouth daily.      . magnesium oxide (MAG-OX) 400 MG tablet Take 1 tablet (400 mg total) by mouth 2 (two) times daily. Take 400 mg one tablet two times a day for five days then cut back to once a day after those 5 days  40 tablet  6  . omeprazole (PRILOSEC) 20 MG capsule Take 1 capsule (20 mg total) by mouth daily  before breakfast.  30 capsule  11   No current facility-administered medications for this visit.    Past Medical History  Diagnosis Date  . Asthma   . CAD (coronary artery disease)     Pt reports MI in 2006 (no documentation).  Cardiolite in 05/2002 and 07/2006 did not reveal any reversible ischemia.  Pt follows with [facility]. Rex K[facility] at [facility].  . CHF (congestive heart failure)     EF 25-30% with dilated LV, mild LVH, severe hypokinesis, and mod-severe reduction in RV function  . Osteoporosis   . HYPERTENSION 08/03/2006  . GASTROPARESIS, DIABETIC 08/03/2006  . HYPERLIPIDEMIA 08/03/2006  . OBSTRUCTIVE SLEEP APNEA 01/06/2008  . PERIPHERAL NEUROPATHY 08/03/2006  . GERD 08/03/2006  . LOW BACK PAIN, CHRONIC 08/03/2006  . OSTEOPOROSIS 03/21/2009  . CEREBRAL EMBOLISM, WITH INFARCTION 07/02/2010  . Angina   . Myocardial infarction "2 or 3"  . Pneumonia 02/26/12    "a few times; probably even today"  . Shortness of breath     "all the time"  . DIABETES MELLITUS, TYPE II 11/04/1983  . Blood transfusion 08/2011  . Lower GI bleeding 08/2011 and 01/2014  . Chronic daily headache   . Migraines   . Stroke summer 2011    "made my left hip worse"  . Uterine cancer   . Nonischemic cardiomyopathy 05/2010    Left heart catheterization:2011. Nonobstructive coronary artery disease.  . Pulmonary hypertension   . Hypertension   . NSTEMI (non-ST elevated myocardial infarction) 01/2014    type II with CVA  . Acute embolic stroke 1/61/0960  . Non-ST elevation MI (NSTEMI) 02/04/2014  . E. coli UTI 03/02/2014  . Renal failure, acute 02/04/2014  . Iron deficiency anemia secondary to blood loss (chronic) 01/06/2008    Qualifier: Diagnosis of  By: Tomasa Hosteller MD, Edmon Crape.     Past Surgical History  Procedure Laterality Date  . Esophagogastroduodenoscopy  08/26/2011    Procedure: ESOPHAGOGASTRODUODENOSCOPY (EGD);  Surgeon: [name], MD;  Location: [facility] ENDOSCOPY;  Service: Endoscopy;  Laterality: N/A;  .  Colonoscopy  08/28/2011    Procedure: COLONOSCOPY;  Surgeon: [name], MD;  Location: [place];  Service: Endoscopy;  Laterality: N/A;  . Vaginal hysterectomy    . Tubal ligation    . Cataract extraction w/ intraocular lens  implant, bilateral    . Toe surgery      "right big toe; operated on it to straighten it out; it was under"  . Polysomnogram  10/17/2005    AHI-7.28/hr. AHI  REM-20.8/hr. Average oxygen saturation range during REM and NREM was 97%. Lowest oxygen saturation during REM sleep was 90%.  . Carotid duplex  05/28/2010    No significant extracranial carotid artery stenosis demonstrated. Vertebrals are patent w/ antegrade flow.  . Cardiac catheterization  05/24/2010    No intervention - recommed medical therapy.  . Cardiovascular stress test  08/07/2006    Moderate-severe defect seen in Basal inferior, Mid inferoseptal, Mid inferior, Mid inferolateral, and Apical inferior regions - consistent w/ infarct/scar. No scintigraphic evidence of inducible myocardial ischemia.  . Transthoracic echocardiogram  08/29/2011    EF 55-60%, moderate LVH,   . Esophagogastroduodenoscopy N/A 02/08/2014    Procedure: ESOPHAGOGASTRODUODENOSCOPY (EGD);  Surgeon: [name], MD;  Location: [facility] ENDOSCOPY;  Service: Endoscopy;  Laterality: N/A;    FXT:KWIOXBD:ZH colds or fevers,  weight at discharge from main hospital 167 now 169 Skin:no rashes or ulcers HEENT:no blurred vision, no congestion CV:see HPI PUL:see HPI GI:no diarrhea constipation or melena, no indigestion GU:no hematuria, no dysuria MS:no joint pain, no claudication Neuro:no syncope, no lightheadedness Endo:+ diabetes- 110-132 though she did have an episode of hypo-glycemia last week I asked him to have Ensure at home in case she does have more hypoglycemia, no thyroid disease  PHYSICAL EXAM BP 140/70  Pulse 64  Ht 5\' 2"  (1.575 m)  Wt 169 lb (76.658 kg)  BMI 30.90 kg/m2  LMP 12/19/1968 General:Pleasant affect,  NAD Skin:Warm and dry, brisk capillary refill HEENT:normocephalic, sclera clear, mucus membranes moist Neck:supple, no JVD, no bruits  Heart:S1S2 RRR positive for premature beats, without murmur, gallup, rub or click Lungs:clear without rales, rhonchi, or wheezes GDJ:MEQA, non tender, + BS, do not palpate liver spleen or masses Ext:no lower ext edema, 2+ pedal pulses, 2+ radial pulses Neuro:alert and oriented, MAE, follows commands,    EKG: SR with bigeminy PVCs but no acute changes otherwise chronic nonspecific ST-T wave abnormalities. QTC documented at 507 ms but actually the QT is not as prolonged as that with PVCs it makes it more difficult for computer to read the QTC.  ASSESSMENT AND PLAN NSTEMI (non-ST elevated myocardial infarction) During hospitalization with admission for GI bleed CVA felt to be a type II demand ischemic MI. Our plan will be to proceed with LexiScan Myoview in 4-6 weeks when she is improved she just out of rehabilitation to ensure that she has no underlying stenosis. Her last Cath was in 2011 at that time she had nonobstructive coronary disease.  40-50% LAD stenosis and OM 2 with a 60% stenosis but was a small branch. RCA had mild luminal irregularities left main was patent EF at that time was 20-30% and she had moderate to severe MR.  Nonischemic cardiomyopathy Previous EF 20-30% by cath in 2011 but more recent echo in March of this year EF was 40%. Currently euvolemic.  Atrial fibrillation During a recent hospitalization in April and May at times she appeared to be in atrial fibrillation though on EKGs there were P waves visible. Due to GI bleed she was not placed on anticoagulation, she is to start an aspirin daily. On 81 mg of aspirin.  We will have her wear a monitor for 2 weeks event monitor to evaluate A. fib burden if there aren't any as well as she is bigeminy PVCs here today.  CAD (coronary artery disease) Please see above note for non-ST elevation  MI  Iron deficiency anemia secondary to blood loss (chronic) GI bleed with last hospitalization last hemoglobin 8.5 we  will recheck her CBC today to evaluate.  DM (diabetes mellitus), type 2, uncontrolled Stable her son monitors her Accu-Cheks. We discussed having Ensure at home and if she were to develop hypoglycemia at night to have boxes of juice at the bedside.  CVA (cerebral infarction) Recent CVA followed by neurology. She ambulate at home with a walker today here in the office she is in a wheelchair.  ARF (acute renal failure) Creatinine 2 Will recheck today  Ventricular bigeminy Bigeminy PVCs today we'll check a basic metabolic panel, comprehensive metabolic panel as well as a magnesium level.  She'll wear an event monitor for 2 weeks.

## 2014-03-13 NOTE — Assessment & Plan Note (Addendum)
Stable her son monitors her Accu-Cheks. We discussed having Ensure at home and if she were to develop hypoglycemia at night to have boxes of juice at the bedside.

## 2014-03-13 NOTE — Assessment & Plan Note (Signed)
Please see above note for non-ST elevation MI

## 2014-03-13 NOTE — Assessment & Plan Note (Signed)
Creatinine 2 Will recheck today

## 2014-03-13 NOTE — Assessment & Plan Note (Signed)
GI bleed with last hospitalization last hemoglobin 8.5 we will recheck her CBC today to evaluate.

## 2014-03-13 NOTE — Assessment & Plan Note (Signed)
Previous EF 20-30% by cath in 2011 but more recent echo in March of this year EF was 40%. Currently euvolemic.

## 2014-03-13 NOTE — Patient Instructions (Addendum)
Your physician has recommended that you wear an event monitor for 2 weeks. Event monitors are medical devices that record the heart's electrical activity. Doctors most often [place] these monitors to diagnose arrhythmias. Arrhythmias are problems with the speed or rhythm of the heartbeat. The monitor is a small, portable device. You can wear one while you do your normal daily activities. This is usually used to diagnose what is causing palpitations/syncope (passing out).  [name] has requested that you have a lexiscan myoview in 4-6 weeks. For further information please visit [email or web address]. Please follow instruction sheet, as given.  [name] has order you to have some blood work to be done TODAY.  Your physician recommends that you schedule a follow-up appointment in 6 weeks with Dr Gwenlyn Found.

## 2014-03-13 NOTE — Assessment & Plan Note (Addendum)
During hospitalization with admission for GI bleed CVA felt to be a type II demand ischemic MI. Our plan will be to proceed with LexiScan Myoview in 4-6 weeks when she is improved she just out of rehabilitation to ensure that she has no underlying stenosis. Her last Cath was in 2011 at that time she had nonobstructive coronary disease.  40-50% LAD stenosis and OM 2 with a 60% stenosis but was a small branch. RCA had mild luminal irregularities left main was patent EF at that time was 20-30% and she had moderate to severe MR.

## 2014-03-13 NOTE — Assessment & Plan Note (Signed)
Recent CVA followed by neurology. She ambulate at home with a walker today here in the office she is in a wheelchair.

## 2014-03-14 ENCOUNTER — Other Ambulatory Visit: Payer: Self-pay | Admitting: *Deleted

## 2014-03-14 MED ORDER — MAGNESIUM OXIDE 400 MG PO TABS: 400.0000 mg | ORAL_TABLET | Freq: Two times a day (BID) | ORAL | Status: DC

## 2014-03-14 NOTE — Progress Notes (Signed)
Pt. Called to give test results, no answer and voice mail has not been set up yet

## 2014-03-16 ENCOUNTER — Encounter: Payer: Self-pay | Admitting: Internal Medicine

## 2014-03-16 ENCOUNTER — Ambulatory Visit (INDEPENDENT_AMBULATORY_CARE_PROVIDER_SITE_OTHER): Payer: Medicare Other | Admitting: Internal Medicine

## 2014-03-16 VITALS — BP 158/68 | HR 68 | Ht 62.0 in | Wt 159.0 lb

## 2014-03-16 DIAGNOSIS — I6340 Cerebral infarction due to embolism of unspecified cerebral artery: Secondary | ICD-10-CM

## 2014-03-16 DIAGNOSIS — I4891 Unspecified atrial fibrillation: Secondary | ICD-10-CM

## 2014-03-16 DIAGNOSIS — K2961 Other gastritis with bleeding: Secondary | ICD-10-CM

## 2014-03-16 DIAGNOSIS — D500 Iron deficiency anemia secondary to blood loss (chronic): Secondary | ICD-10-CM

## 2014-03-16 MED ORDER — FERROUS SULFATE 325 (65 FE) MG PO TABS: 325.0000 mg | ORAL_TABLET | Freq: Two times a day (BID) | ORAL | Status: DC

## 2014-03-16 MED ORDER — OMEPRAZOLE 20 MG PO CPDR: 20.0000 mg | DELAYED_RELEASE_CAPSULE | Freq: Every day | ORAL | Status: DC

## 2014-03-16 NOTE — Assessment & Plan Note (Signed)
OK to anticoagulate - stay on PPI - take off ASA then

## 2014-03-16 NOTE — Patient Instructions (Signed)
We have sent the following medications to your pharmacy for you to pick up at your convenience: Omeprazole, Ferrous Sulfate  Come back in 2 months and get blood work please. No appointment needed, just go to the basement lab, they are open 7:30am-5:30pm.   I appreciate the opportunity to care for you.

## 2014-03-16 NOTE — Assessment & Plan Note (Signed)
Restart PPI qd Stay on FOREVER [place] for anticoag Tx again w/ close f/u - to prevent stroke

## 2014-03-16 NOTE — Progress Notes (Signed)
Subjective:    Patient ID: [name], female    DOB: 1933-07-26, 80 y.o.   MRN: [id number]  HPI Pleasant elderly bw with recent melena while anticoagulated - EGD 02/08/14 showed erosive gastritis which she had in 2012 EGD w/u for fe defic anemia. Colonoscopy negative then. She is not on PPI at this time. Has been started on 81 mg ASA - anti-coag Tx was held due to bleeding. She has PAF and had an embolic stroke. Minimal deficits from the stroke if any - she had transient aphasia.  Son here today and participates.  Denies signs of bleeding  Allergies  Allergen Reactions  . Baking Soda-Fluoride [Sodium Fluoride] Nausea And Vomiting  . Magnesium Hydroxide Nausea And Vomiting   Outpatient Prescriptions Prior to Visit  Medication Sig Dispense Refill  . acetaminophen (TYLENOL) 500 MG tablet Take 500 mg by mouth every 6 (six) hours as needed for moderate pain or headache.      . albuterol (PROAIR HFA) 108 (90 BASE) MCG/ACT inhaler Inhale 2 puffs into the lungs every 6 (six) hours as needed for wheezing.  8.5 g  11  . amLODipine (NORVASC) 10 MG tablet Take 1 tablet (10 mg total) by mouth daily.  30 tablet  2  . atorvastatin (LIPITOR) 40 MG tablet Take 1 tablet (40 mg total) by mouth daily at 6 PM.  30 tablet  0  . insulin glargine (LANTUS) 100 UNIT/ML injection Inject 0.1 mLs (10 Units total) into the skin at bedtime.  10 mL  11  . isosorbide mononitrate (IMDUR) 60 MG 24 hr tablet Take 1 tablet (60 mg total) by mouth daily.  30 tablet  2  . KLOR-CON 10 10 MEQ tablet Take 1 tablet by mouth every other day.      . loratadine (ALLERGY RELIEF) 10 MG tablet Take 10 mg by mouth daily.      . magnesium oxide (MAG-OX) 400 MG tablet Take 1 tablet (400 mg total) by mouth 2 (two) times daily. Take 400 mg one tablet two times a day for five days then cut back to once a day after those 5 days  40 tablet  6  . metoprolol succinate (TOPROL-XL) 25 MG 24 hr tablet Take 1 tablet (25 mg total) by  mouth daily.  30 tablet  0  . Multiple Vitamins-Minerals (ALIVE WOMENS 50+ PO) Take 1 tablet by mouth daily.      . polyethylene glycol (MIRALAX / GLYCOLAX) packet Take 17 g by mouth daily.      Marland Kitchen torsemide (DEMADEX) 10 MG tablet Take 5 tablets by mouth daily.      . ciprofloxacin (CIPRO) 250 MG tablet Take 1 tablet (250 mg total) by mouth daily with breakfast.  6 tablet  0  . pantoprazole (PROTONIX) 40 MG tablet Take 1 tablet (40 mg total) by mouth 2 (two) times daily.  60 tablet  0   No facility-administered medications prior to visit.   Past Medical History  Diagnosis Date  . Asthma   . CAD (coronary artery disease)     Pt reports MI in 2006 (no documentation).  Cardiolite in 05/2002 and 07/2006 did not reveal any reversible ischemia.  Pt follows with Dr. Rex Kras at [facility].  . CHF (congestive heart failure)     EF 25-30% with dilated LV, mild LVH, severe hypokinesis, and mod-severe reduction in RV function  . Osteoporosis   . HYPERTENSION 08/03/2006  . GASTROPARESIS, DIABETIC 08/03/2006  .  HYPERLIPIDEMIA 08/03/2006  . OBSTRUCTIVE SLEEP APNEA 01/06/2008  . PERIPHERAL NEUROPATHY 08/03/2006  . GERD 08/03/2006  . LOW BACK PAIN, CHRONIC 08/03/2006  . OSTEOPOROSIS 03/21/2009  . CEREBRAL EMBOLISM, WITH INFARCTION 07/02/2010  . Angina   . Myocardial infarction "2 or 3"  . Pneumonia 02/26/12    "a few times; probably even today"  . Shortness of breath     "all the time"  . DIABETES MELLITUS, TYPE II 11/04/1983  . Blood transfusion 08/2011  . Lower GI bleeding 08/2011 and 01/2014  . Chronic daily headache   . Migraines   . Stroke summer 2011    "made my left hip worse"  . Uterine cancer   . Nonischemic cardiomyopathy 05/2010    Left heart catheterization:2011. Nonobstructive coronary artery disease.  . Pulmonary hypertension   . Hypertension   . NSTEMI (non-ST elevated myocardial infarction) 01/2014    type II with CVA  . Acute embolic stroke 04/05/7627  . Non-ST elevation MI (NSTEMI)  02/04/2014  . E. coli UTI 03/02/2014  . Renal failure, acute 02/04/2014  . Iron deficiency anemia secondary to blood loss (chronic) 01/06/2008    Qualifier: Diagnosis of  By: Tomasa Hosteller MD, Edmon Crape.    Past Surgical History  Procedure Laterality Date  . Esophagogastroduodenoscopy  08/26/2011    Procedure: ESOPHAGOGASTRODUODENOSCOPY (EGD);  Surgeon: [name], MD;  Location: [facility] ENDOSCOPY;  Service: Endoscopy;  Laterality: N/A;  . Colonoscopy  08/28/2011    Procedure: COLONOSCOPY;  Surgeon: [name], MD;  Location: [facility];  Service: Endoscopy;  Laterality: N/A;  . Vaginal hysterectomy    . Tubal ligation    . Cataract extraction w/ intraocular lens  implant, bilateral    . Toe surgery      "right big toe; operated on it to straighten it out; it was under"  . Polysomnogram  10/17/2005    AHI-7.28/hr. AHI REM-20.8/hr. Average oxygen saturation range during REM and NREM was 97%. Lowest oxygen saturation during REM sleep was 90%.  . Carotid duplex  05/28/2010    No significant extracranial carotid artery stenosis demonstrated. Vertebrals are patent w/ antegrade flow.  . Cardiac catheterization  05/24/2010    No intervention - recommed medical therapy.  . Cardiovascular stress test  08/07/2006    Moderate-severe defect seen in Basal inferior, Mid inferoseptal, Mid inferior, Mid inferolateral, and Apical inferior regions - consistent w/ infarct/scar. No scintigraphic evidence of inducible myocardial ischemia.  . Transthoracic echocardiogram  08/29/2011    EF 55-60%, moderate LVH,   . Esophagogastroduodenoscopy N/A 02/08/2014    Procedure: ESOPHAGOGASTRODUODENOSCOPY (EGD);  Surgeon: [name], MD;  Location: [facility] ENDOSCOPY;  Service: Endoscopy;  Laterality: N/A;    Review of Systems As above    Objective:   Physical Exam Elderly bw NAD - using a walker to ambulate    Assessment & Plan:  Iron deficiency anemia secondary to blood loss (chronic) Recurrent erosive gastritis    Colonoscopy negative 2012 - anemic then ? If never caught up Ferrous sulfate 325 mg bid Would not do a stress test until Hgb normalizes CBC in 2 months ([facility]) Erosive gastritis with hemorrhage Restart PPI qd Stay on FOREVER Ok for anticoag Tx again w/ close f/u - to prevent stroke  Atrial fibrillation OK to anticoagulate - stay on PPI - take off ASA then   BT:DVVO,HYWVPX K, MD Cecilie Kicks, NP

## 2014-03-16 NOTE — Assessment & Plan Note (Signed)
Recurrent erosive gastritis  Colonoscopy negative 2012 - anemic then ? If never caught up Ferrous sulfate 325 mg bid Would not do a stress test until Hgb normalizes

## 2014-03-22 ENCOUNTER — Telehealth: Payer: Self-pay | Admitting: Cardiovascular Disease

## 2014-03-22 NOTE — Telephone Encounter (Signed)
Received call from Leslie.  Upon visit today, patient complained of chest pain and numbness and tingling in left arm yesterday. Today the patient complains of shortness of breath.  VS today - BP elevated (not given numerical value) O2 sats - 92% Patient encouraged to use inhaler, which is provide some relief inititally.   RN reports visible work of breath, NO crackles/wheezing on ausculation of breath sounds.   Patient saw Mickel Baas, NP on 6/1 and is wearing event monitor.   Will forward message to Dr. Gwenlyn Found to review.

## 2014-03-22 NOTE — Telephone Encounter (Signed)
RN spoke with son and provided him with same information as provided to [facility] RN. Also verified that event monitor leads were in correct position (as reports states that leads are reversed). He will contact office with any concerns.

## 2014-03-22 NOTE — Telephone Encounter (Signed)
Pt instructed to change the patch on the right arm lead and give up to 20 mins to adhere, pt. Stated understanding of instructions

## 2014-03-22 NOTE — Telephone Encounter (Signed)
[facility] RN also reported that patient appeared to be breathing easier/more comfortably when she left.

## 2014-03-22 NOTE — Telephone Encounter (Signed)
RN reviewed cardionet monitor strips with Dr. Gwenlyn Found (DOD) and OV note from Mickel Baas, NP from 6/1 and he advised that if patient has continued shortness of breath tomorrow, have them call back and have patient see extender.   Will notify [facility] RN

## 2014-03-22 NOTE — Telephone Encounter (Signed)
Please call pt is wearing a monitor and it is not acting right.

## 2014-03-23 ENCOUNTER — Other Ambulatory Visit: Payer: Self-pay

## 2014-03-23 ENCOUNTER — Ambulatory Visit (HOSPITAL_BASED_OUTPATIENT_CLINIC_OR_DEPARTMENT_OTHER): Payer: Medicare Other | Admitting: Physical Medicine & Rehabilitation

## 2014-03-23 ENCOUNTER — Encounter: Payer: Medicare Other | Attending: Physical Medicine & Rehabilitation

## 2014-03-23 ENCOUNTER — Encounter: Payer: Self-pay | Admitting: Physical Medicine & Rehabilitation

## 2014-03-23 VITALS — BP 147/66 | HR 82 | Resp 14 | Wt 160.0 lb

## 2014-03-23 DIAGNOSIS — E1142 Type 2 diabetes mellitus with diabetic polyneuropathy: Secondary | ICD-10-CM | POA: Diagnosis not present

## 2014-03-23 DIAGNOSIS — I509 Heart failure, unspecified: Secondary | ICD-10-CM | POA: Diagnosis not present

## 2014-03-23 DIAGNOSIS — J45909 Unspecified asthma, uncomplicated: Secondary | ICD-10-CM | POA: Insufficient documentation

## 2014-03-23 DIAGNOSIS — I6340 Cerebral infarction due to embolism of unspecified cerebral artery: Secondary | ICD-10-CM

## 2014-03-23 DIAGNOSIS — K3184 Gastroparesis: Secondary | ICD-10-CM | POA: Insufficient documentation

## 2014-03-23 DIAGNOSIS — I10 Essential (primary) hypertension: Secondary | ICD-10-CM | POA: Diagnosis not present

## 2014-03-23 DIAGNOSIS — F0390 Unspecified dementia without behavioral disturbance: Secondary | ICD-10-CM | POA: Diagnosis not present

## 2014-03-23 DIAGNOSIS — I69993 Ataxia following unspecified cerebrovascular disease: Secondary | ICD-10-CM | POA: Diagnosis present

## 2014-03-23 DIAGNOSIS — K219 Gastro-esophageal reflux disease without esophagitis: Secondary | ICD-10-CM | POA: Diagnosis not present

## 2014-03-23 DIAGNOSIS — E785 Hyperlipidemia, unspecified: Secondary | ICD-10-CM | POA: Diagnosis not present

## 2014-03-23 DIAGNOSIS — G4733 Obstructive sleep apnea (adult) (pediatric): Secondary | ICD-10-CM | POA: Insufficient documentation

## 2014-03-23 DIAGNOSIS — I2510 Atherosclerotic heart disease of native coronary artery without angina pectoris: Secondary | ICD-10-CM | POA: Diagnosis not present

## 2014-03-23 DIAGNOSIS — I252 Old myocardial infarction: Secondary | ICD-10-CM | POA: Diagnosis not present

## 2014-03-23 DIAGNOSIS — E1149 Type 2 diabetes mellitus with other diabetic neurological complication: Secondary | ICD-10-CM | POA: Insufficient documentation

## 2014-03-23 NOTE — Progress Notes (Signed)
Subjective:    Patient ID: [name], female    DOB: April 29, 1933, 80 y.o.   MRN: [id number]  HPI 80 y.o. female with h/o CAD, DM type 2 with gastroparesis, CHF exacerbation 11/2103, GIB, early dementia who has had recent increase in falls with melena. She was admitted on 04/25 /15 with expressive aphasia, lethargy as well as twisted lip. She was found to be in acute renal failure and anemic with hgb-5.6. She was treated with IVF and transfused with 2 unit PRBC. Patient with elevated cardiac enzyme that was felt to be multifactorial due to demand ischemia. 2 D echo with EF 40-45%. Dr. Jonnie Finner consulted for input on acute on chronic renal failure and recommended d/c ace as well as hydration as patient poor HD candidate. Patient continued to be confused with fluctuating mentation and MRI brain done revealing multiple bilateral watershed infarcts. Carotid dopplers without ICA stenosis. Neurology recommended ASA if GIB resolved. Dr. Carlean Purl consulted for input and EGD done on 02/08/14 Revealing erosive gastritis in gastric antrum. She's cleared to use low dose ASA  No falls HHPT, and RN still coming out Has been following up with cardiology. C/o SOB, walked from the car to my office with a walker. No CP  Pain Inventory Average Pain 7 Pain Right Now 7 My pain is intermittent and aching  In the last 24 hours, has pain interfered with the following? General activity 5 Relation with others 5 Enjoyment of life 5 What TIME of day is your pain at its worst? night Sleep (in general) Poor  Pain is worse with: walking Pain improves with: rest Relief from Meds: 6  Mobility walk with assistance use a walker ability to climb steps?  no needs help with transfers  Function not employed: date last employed na I need assistance with the following:  bathing, meal prep, household duties and shopping  Neuro/Psych weakness trouble walking  Prior Studies Any changes since last visit?   no  Physicians involved in your care Any changes since last visit?  yes Primary care Dr. Berdine Addison   Family History  Problem Relation Age of Onset  . Diabetes insipidus Mother   . Hypertension Mother   . Hypertension Father   . Hypertension Sister   . Hypertension Child   . Stomach cancer Brother    History   Social History  . Marital Status: Widowed    Spouse Name: N/A    Number of Children: 7  . Years of Education: N/A   Occupational History  . Disabled    Social History Main Topics  . Smoking status: Never Smoker   . Smokeless tobacco: Former Systems developer    Types: Snuff    Quit date: 10/13/2006     Comment: 02/26/12 "stopped snuff 4-6 years ago"  . Alcohol Use: No     Comment: "stopped drinking alcohol ~ 1980's"  . Drug Use: No  . Sexual Activity: No   Other Topics Concern  . None   Social History Narrative   ** Merged History Encounter **       Lives with daughter   Past Surgical History  Procedure Laterality Date  . Esophagogastroduodenoscopy  08/26/2011    Procedure: ESOPHAGOGASTRODUODENOSCOPY (EGD);  Surgeon: [name], MD;  Location: [facility] ENDOSCOPY;  Service: Endoscopy;  Laterality: N/A;  . Colonoscopy  08/28/2011    Procedure: COLONOSCOPY;  Surgeon: [name], MD;  Location: [facility];  Service: Endoscopy;  Laterality: N/A;  . Vaginal hysterectomy    .  Tubal ligation    . Cataract extraction w/ intraocular lens  implant, bilateral    . Toe surgery      "right big toe; operated on it to straighten it out; it was under"  . Polysomnogram  10/17/2005    AHI-7.28/hr. AHI REM-20.8/hr. Average oxygen saturation range during REM and NREM was 97%. Lowest oxygen saturation during REM sleep was 90%.  . Carotid duplex  05/28/2010    No significant extracranial carotid artery stenosis demonstrated. Vertebrals are patent w/ antegrade flow.  . Cardiac catheterization  05/24/2010    No intervention - recommed medical therapy.  . Cardiovascular stress test  08/07/2006     Moderate-severe defect seen in Basal inferior, Mid inferoseptal, Mid inferior, Mid inferolateral, and Apical inferior regions - consistent w/ infarct/scar. No scintigraphic evidence of inducible myocardial ischemia.  . Transthoracic echocardiogram  08/29/2011    EF 55-60%, moderate LVH,   . Esophagogastroduodenoscopy N/A 02/08/2014    Procedure: ESOPHAGOGASTRODUODENOSCOPY (EGD);  Surgeon: [name], MD;  Location: [facility] ENDOSCOPY;  Service: Endoscopy;  Laterality: N/A;   Past Medical History  Diagnosis Date  . Asthma   . CAD (coronary artery disease)     Pt reports MI in 2006 (no documentation).  Cardiolite in 05/2002 and 07/2006 did not reveal any reversible ischemia.  Pt [facility] with [facility] Rex [facility].  . CHF (congestive heart failure)     EF 25-30% with dilated LV, mild LVH, severe hypokinesis, and mod-severe reduction in RV function  . Osteoporosis   . HYPERTENSION 08/03/2006  . GASTROPARESIS, DIABETIC 08/03/2006  . HYPERLIPIDEMIA 08/03/2006  . OBSTRUCTIVE SLEEP APNEA 01/06/2008  . PERIPHERAL NEUROPATHY 08/03/2006  . GERD 08/03/2006  . LOW BACK PAIN, CHRONIC 08/03/2006  . OSTEOPOROSIS 03/21/2009  . CEREBRAL EMBOLISM, WITH INFARCTION 07/02/2010  . Angina   . Myocardial infarction "2 or 3"  . Pneumonia 02/26/12    "a few times; probably even today"  . Shortness of breath     "all the time"  . DIABETES MELLITUS, TYPE II 11/04/1983  . Blood transfusion 08/2011  . Lower GI bleeding 08/2011 and 01/2014  . Chronic daily headache   . Migraines   . Stroke summer 2011    "made my left hip worse"  . Uterine cancer   . Nonischemic cardiomyopathy 05/2010    Left heart catheterization:2011. Nonobstructive coronary artery disease.  . Pulmonary hypertension   . Hypertension   . NSTEMI (non-ST elevated myocardial infarction) 01/2014    type II with CVA  . Acute embolic stroke 06/13/5175  . Non-ST elevation MI (NSTEMI) 02/04/2014  . E. coli UTI 03/02/2014  . Renal failure, acute 02/04/2014   . Iron deficiency anemia secondary to blood loss (chronic) 01/06/2008    Qualifier: Diagnosis of  By: Tomasa Hosteller MD, Veronique D.    BP 147/66  Pulse 82  Resp 14  Wt 160 lb (72.576 kg)  SpO2 94%  LMP 12/19/1968  Opioid Risk Score:   Fall Risk Score: High Fall Risk (>13 points) (pt educated fall risk, brochure given to pt)   Review of Systems  Respiratory: Positive for cough and shortness of breath.   Musculoskeletal: Positive for gait problem.  Neurological: Positive for weakness.  All other systems reviewed and are negative.      Objective:   Physical Exam  Cardiovascular: S1 normal and S2 normal.   Murmur heard.  Systolic murmur is present with a grade of 3/6  Pulmonary/Chest: Effort normal and breath sounds normal.  Oriented to person MD office and time 4/5 strength in bilateral deltoid, bicep, tricep, grip Intention tremor with bilateral finger-nose-finger testing 4 minus/5 in the left hip flexor knee extensor ankle dorsiflexor 4+/5 in the right hip flexor knee extensor ankle dorsiflexor Trace pretibial edema left leg General no acute distress appears tired no respiratory distress     Assessment & Plan:   #1. Watershed infarcts likely embolic. Clinically has weakness in the left lower extremity. Overall improved functionally since discharge from the hospital. Cont HHPT,RN, likely will need OP therapy as well.  2. Shortness of breath likely cardiac related. History of congestive heart failure. Recent MI  Reviewed cardiology notes. Recommendations were for physician extender visit if the patient had continued shortness of breath today. Instructed pt to contact cardiologist , our RN contacted them. Appointment for tomorrow. Patient has poor activity tolerance. She walked about 200 feet which may have exceeded her tolerance. Will use wheelchair to transfer patient back to the car  Has neurology followup next month PM and R. followup when necessary

## 2014-03-23 NOTE — Progress Notes (Signed)
I called Dr Kennon Holter office to see if she could be seen.  They do not have anything available until tomorrow at 3 pm with [place] one of the PA's.  She will take that appt and I have instructed her if she worsens over the night she will need to call paramedics or go to ED.  They understand instructions.

## 2014-03-24 ENCOUNTER — Ambulatory Visit (INDEPENDENT_AMBULATORY_CARE_PROVIDER_SITE_OTHER): Payer: Medicare Other | Admitting: Cardiology

## 2014-03-24 VITALS — BP 134/60 | HR 69 | Ht 62.0 in | Wt 153.0 lb

## 2014-03-24 DIAGNOSIS — Z79899 Other long term (current) drug therapy: Secondary | ICD-10-CM

## 2014-03-24 DIAGNOSIS — R0602 Shortness of breath: Secondary | ICD-10-CM

## 2014-03-24 DIAGNOSIS — I6340 Cerebral infarction due to embolism of unspecified cerebral artery: Secondary | ICD-10-CM

## 2014-03-24 LAB — BASIC METABOLIC PANEL
BUN: 38 mg/dL — ABNORMAL HIGH (ref 6–23)
CHLORIDE: 107 meq/L (ref 96–112)
CO2: 18 meq/L — AB (ref 19–32)
Calcium: 9.2 mg/dL (ref 8.4–10.5)
Creat: 2.72 mg/dL — ABNORMAL HIGH (ref 0.50–1.10)
Glucose, Bld: 45 mg/dL — ABNORMAL LOW (ref 70–99)
POTASSIUM: 4.1 meq/L (ref 3.5–5.3)
SODIUM: 142 meq/L (ref 135–145)

## 2014-03-24 LAB — CBC
HCT: 25.3 % — ABNORMAL LOW (ref 36.0–46.0)
HEMOGLOBIN: 8 g/dL — AB (ref 12.0–15.0)
MCH: 23.1 pg — ABNORMAL LOW (ref 26.0–34.0)
MCHC: 31.6 g/dL (ref 30.0–36.0)
MCV: 72.9 fL — ABNORMAL LOW (ref 78.0–100.0)
Platelets: 220 10*3/uL (ref 150–400)
RBC: 3.47 MIL/uL — AB (ref 3.87–5.11)
RDW: 26.8 % — ABNORMAL HIGH (ref 11.5–15.5)
WBC: 5.9 10*3/uL (ref 4.0–10.5)

## 2014-03-24 LAB — BRAIN NATRIURETIC PEPTIDE: BRAIN NATRIURETIC PEPTIDE: 972.2 pg/mL — AB (ref 0.0–100.0)

## 2014-03-24 NOTE — Progress Notes (Signed)
Patient ID: [name], female   DOB: July 31, 1933, 80 y.o.   MRN: [id number]    03/24/2014 ARY RUDNICK   May 08, 1933  [id number]  Primary Physicia [name], MD Primary Cardiologist: Dr. Gwenlyn Found  HPI:  The patient is a 80 year old African American female, formerly followed by Dr. Rex Kras. She is now followed by Dr. Gwenlyn Found. She has a history of nonischemic cardiomyopathy documented by cardiac catheterization, performed by Dr. Debara Pickett on 05/24/2010. She had minimal CAD with an ejection fraction of 20%. Her other past medical history is significant for hypertension, hyperlipidemia, diabetes and CVA. She had a recent hospitalization in February of this year for congestive heart failure. Pro BNP at that time was 7000 and she was treated with IV diuretics. She was last seen by Dr. Gwenlyn Found for post hospital followup on 12/12/2013. At that time, she was felt to be stable from a cardiac standpoint.  She was apparently hospitalized again from 02/04/2014 to 02/10/2014. She had an acute stroke, possible watershed infarct versus embolic stroke. She also had acute blood loss anemia. She also had a non-ST elevation MI, felt to be likely type II from her anemia. She had a repeat 2-D echocardiogram which demonstrated improvement in systolic function to 24-40%. At times during her hospitalization, it was felt that she was in atrial fibrillation, however review of her EKGs did not show any afib. Given her CVA, there was concern that she may need anticoagulation. Subsequently, she was prescribed a 2 week heart monitor. This was ordered by Cecilie Kicks, NP, on 03/13/14 during post hospital f/u. The patient is still wearing the monitor and no arrhthymias have been captured.  She presents to clinic today with a complaint of dyspnea at rest and worse with exertion. This started 3 days ago. She denies chest pain. No dizziness, syncope/ near syncope and melena. No LEE, orthopnea or PND. She does not weigh herself daily but has not  noticed any significant weight gain. She reports decreased appetite and denies any excessive sodium intake. She has been very fatigue and notes feeling very cold. She states that she has been compliant with her medications including her daily Fe supplementations.   Current Outpatient Prescriptions  Medication Sig Dispense Refill  . acetaminophen (TYLENOL) 500 MG tablet Take 500 mg by mouth every 6 (six) hours as needed for moderate pain or headache.      . albuterol (PROAIR HFA) 108 (90 BASE) MCG/ACT inhaler Inhale 2 puffs into the lungs every 6 (six) hours as needed for wheezing.  8.5 g  11  . amLODipine (NORVASC) 10 MG tablet Take 1 tablet (10 mg total) by mouth daily.  30 tablet  2  . aspirin 81 MG tablet Take 81 mg by mouth daily.      Marland Kitchen atorvastatin (LIPITOR) 40 MG tablet Take 1 tablet (40 mg total) by mouth daily at 6 PM.  30 tablet  0  . ferrous sulfate 325 (65 FE) MG tablet Take 1 tablet (325 mg total) by mouth 2 (two) times daily with a meal.  60 tablet  5  . insulin glargine (LANTUS) 100 UNIT/ML injection Inject 0.1 mLs (10 Units total) into the skin at bedtime.  10 mL  11  . isosorbide mononitrate (IMDUR) 60 MG 24 hr tablet Take 1 tablet (60 mg total) by mouth daily.  30 tablet  2  . KLOR-CON 10 10 MEQ tablet Take 1 tablet by mouth every other day.      . lisinopril (PRINIVIL,ZESTRIL) 5 MG  tablet Take 5 mg by mouth daily.      Marland Kitchen loratadine (ALLERGY RELIEF) 10 MG tablet Take 10 mg by mouth daily.      . magnesium oxide (MAG-OX) 400 MG tablet Take 1 tablet (400 mg total) by mouth 2 (two) times daily. Take 400 mg one tablet two times a day for five days then cut back to once a day after those 5 days  40 tablet  6  . metoprolol succinate (TOPROL-XL) 25 MG 24 hr tablet Take 1 tablet (25 mg total) by mouth daily.  30 tablet  0  . Multiple Vitamins-Minerals (ALIVE WOMENS 50+ PO) Take 1 tablet by mouth daily.      Marland Kitchen omeprazole (PRILOSEC) 20 MG capsule Take 1 capsule (20 mg total) by mouth daily  before breakfast.  30 capsule  11  . polyethylene glycol (MIRALAX / GLYCOLAX) packet Take 17 g by mouth daily.      Marland Kitchen torsemide (DEMADEX) 10 MG tablet Take 5 tablets by mouth daily.      . [DISCONTINUED] pantoprazole (PROTONIX) 40 MG tablet Take 1 tablet (40 mg total) by mouth 2 (two) times daily.  60 tablet  0   No current facility-administered medications for this visit.    Allergies  Allergen Reactions  . Baking Soda-Fluoride [Sodium Fluoride] Nausea And Vomiting  . Magnesium Hydroxide Nausea And Vomiting    History   Social History  . Marital Status: Widowed    Spouse Name: N/A    Number of Children: 7  . Years of Education: N/A   Occupational History  . Disabled    Social History Main Topics  . Smoking status: Never Smoker   . Smokeless tobacco: Former Systems developer    Types: Snuff    Quit date: 10/13/2006     Comment: 02/26/12 "stopped snuff 4-6 years ago"  . Alcohol Use: No     Comment: "stopped drinking alcohol ~ 1980's"  . Drug Use: No  . Sexual Activity: No   Other Topics Concern  . Not on file   Social History Narrative   ** Merged History Encounter **       Lives with daughter     Review of Systems: General: negative for chills, fever, night sweats or weight changes.  Cardiovascular: negative for chest pain, dyspnea on exertion, edema, orthopnea, palpitations, paroxysmal nocturnal dyspnea or shortness of breath Dermatological: negative for rash Respiratory: negative for cough or wheezing Urologic: negative for hematuria Abdominal: negative for nausea, vomiting, diarrhea, bright red blood per rectum, melena, or hematemesis Neurologic: negative for visual changes, syncope, or dizziness All other systems reviewed and are otherwise negative except as noted above.    Height 5\' 2"  (1.575 m), weight 153 lb (69.4 kg), last menstrual period 12/19/1968.  General appearance: alert, cooperative and no distress Neck: no carotid bruit and no JVD Lungs: clear to  auscultation bilaterally Heart: regular rate and rhythm and 2/6 SM Extremities: no LEE Pulses: 2+ and symmetric Skin: warm and dry Neurologic: Grossly normal  EKG normal sinus rhythm. Heart rate 64 beats per minute. Left bundle branch block (old).  ASSESSMENT AND PLAN:   1. Dyspnea: Patient notes shortness of breath X the last 3 days. She has shortness of breath at rest that is also worse with exertion. She denies chest pain. She also denies weight gain, peripheral edema, orthopnea and PND. She also endorses constant fatigue and cold intolerances. She denies any melena, hematochezia. She states that she has been compliant with her daily  iron. Differential diagnosis may be worsening anemia, versus acute on chronic diastolic heart failure, although I feel the latter may be less likely, given her lack of weight gain, peripheral edema, orthopnea/PND. Her physical exam is also fairly normal. I did not appreciate any significant JVD and no peripheral edema. Her lungs are also clear to auscultation bilaterally. She was however noted to have a slight cardiac murmur. She states that she has never been told that she's had a murmur before. If she has significant anemia, this may be a flow murmur. I feel that in this situation, it is best to further evaluate with blood work. Will order a CBC to check the status of her hemoglobin. Recently he had severe anemia with a hemoglobin of 5.8 in the hospital and required blood transfusion. This was in the setting of a GI bleed. We'll also check a BNP to see if perhaps heart failure is the etiology. I have also ordered a BMP to assess kidney function, in the event that she will need to increase her torsemide. Her renal function was last reassessed one week ago and although serum creatinine had improved, it emained elevated at 2.75.    PLAN  CBC to rule out anemia, BNP to see if acute HF is the cause of her symptoms. Will also check a BMP to check renal function, in the  event that this is an acute CHF exacerbation and increased diuretic is needed. If BNP is elevated, we will call her with instructions regarding her diuretic. Pt instructed to follow up in 1 week for reassessment. If symptoms worsen and become severe, she was instructed to seek urgent medical evaluation. She verbalized understanding.   Gibbs Naugle, BRITTAINYPA-C 03/24/2014 3:10 PM

## 2014-03-24 NOTE — Patient Instructions (Signed)
Your physician recommends that you schedule a follow-up appointment in: [place] physician recommends that you return for lab work BMP, BNP, CBC

## 2014-03-28 ENCOUNTER — Telehealth: Payer: Self-pay | Admitting: Cardiology

## 2014-03-28 NOTE — Telephone Encounter (Signed)
Please call,do you want her to continue taking the increased dosage of her fluid medicine or cut back on it?

## 2014-03-28 NOTE — Telephone Encounter (Signed)
Pt. Informed that if she was breathing better that she could cut back to the dose she was on prior to the increase, pt. Denied SOB

## 2014-04-03 ENCOUNTER — Encounter: Payer: Self-pay | Admitting: Cardiology

## 2014-04-04 NOTE — Progress Notes (Signed)
Pt. Has appt. With [place] on thursday

## 2014-04-06 ENCOUNTER — Ambulatory Visit (INDEPENDENT_AMBULATORY_CARE_PROVIDER_SITE_OTHER): Payer: Medicare Other | Admitting: Cardiology

## 2014-04-06 ENCOUNTER — Encounter: Payer: Self-pay | Admitting: Cardiology

## 2014-04-06 VITALS — BP 166/65 | HR 65 | Ht 63.0 in | Wt 157.1 lb

## 2014-04-06 DIAGNOSIS — I5022 Chronic systolic (congestive) heart failure: Secondary | ICD-10-CM

## 2014-04-06 DIAGNOSIS — I6340 Cerebral infarction due to embolism of unspecified cerebral artery: Secondary | ICD-10-CM

## 2014-04-06 MED ORDER — ATORVASTATIN CALCIUM 40 MG PO TABS: 40.0000 mg | ORAL_TABLET | Freq: Every day | ORAL | Status: DC

## 2014-04-06 NOTE — Patient Instructions (Signed)
Please check your weight daily.  Your physician recommends that you schedule a follow-up appointment in 2-3 months with Dr Gwenlyn Found.  Low-Sodium Eating Plan Sodium raises blood pressure and causes water to be held in the body. Getting less sodium from food will help lower your blood pressure, reduce any swelling, and protect your heart, liver, and kidneys. We get sodium by adding salt (sodium chloride) to food. Most of our sodium comes from canned, boxed, and frozen foods. Restaurant foods, fast foods, and pizza are also very high in sodium. Even if you take medicine to lower your blood pressure or to reduce fluid in your body, getting less sodium from your food is important. WHAT IS MY PLAN? Most people should limit their sodium intake to 2,300 mg a day. Your health care provider recommends that you limit your sodium intake to __________ a day.  WHAT DO I NEED TO KNOW ABOUT THIS EATING PLAN? For the low-sodium eating plan, you will follow these general guidelines:  Choose foods with a % Daily Value for sodium of less than 5% (as listed on the food label).   Use salt-free seasonings or herbs instead of table salt or sea salt.   Check with your health care provider or pharmacist before using salt substitutes.   Eat fresh foods.  Eat more vegetables and fruits.  Limit canned vegetables. If you do use them, rinse them well to decrease the sodium.   Limit cheese to 1 oz (28 g) per day.   Eat lower-sodium products, often labeled as "lower sodium" or "no salt added."  Avoid foods that contain monosodium glutamate (MSG). MSG is sometimes added to [place] food and some canned foods.  Check food labels (Nutrition Facts labels) on foods to learn how much sodium is in one serving.  Eat more home-cooked food and less restaurant, buffet, and fast food.  When eating at a restaurant, ask that your food be prepared with less salt or none, if possible.  HOW DO I READ FOOD LABELS FOR SODIUM  INFORMATION? The Nutrition Facts label lists the amount of sodium in one serving of the food. If you eat more than one serving, you must multiply the listed amount of sodium by the number of servings. Food labels may also identify foods as:  Sodium free--Less than 5 mg in a serving.  Very low sodium--35 mg or less in a serving.  Low sodium--140 mg or less in a serving.  Light in sodium--50% less sodium in a serving. For example, if a food that usually has 300 mg of sodium is changed to become light in sodium, it will have 150 mg of sodium.  Reduced sodium--25% less sodium in a serving. For example, if a food that usually has 400 mg of sodium is changed to reduced sodium, it will have 300 mg of sodium. WHAT FOODS CAN I EAT? Grains Low-sodium cereals, including oats, puffed wheat and rice, and shredded wheat cereals. Low-sodium crackers. Unsalted rice and pasta. Lower-sodium bread.  Vegetables Frozen or fresh vegetables. Low-sodium or reduced-sodium canned vegetables. Low-sodium or reduced-sodium tomato sauce and paste. Low-sodium or reduced-sodium tomato and vegetable juices.  Fruits Fresh, frozen, and canned fruit. Fruit juice.  Meat and Other Protein Products Low-sodium canned tuna and salmon. Fresh or frozen meat, poultry, seafood, and fish. Lamb. Unsalted nuts. Dried beans, peas, and lentils without added salt. Unsalted canned beans. Homemade soups without salt. Eggs.  Dairy Milk. Soy milk. Ricotta cheese. Low-sodium or reduced-sodium cheeses. Yogurt.  Condiments Fresh and  dried herbs and spices. Salt-free seasonings. Onion and garlic powders. Low-sodium varieties of mustard and ketchup. Lemon juice.  Fats and Oils Reduced-sodium salad dressings. Unsalted butter.  Other Unsalted popcorn and pretzels.  The items listed above may not be a complete list of recommended foods or beverages. Contact your dietitian for more options. WHAT FOODS ARE NOT  RECOMMENDED? Grains Instant hot cereals. Bread stuffing, pancake, and biscuit mixes. Croutons. Seasoned rice or pasta mixes. Noodle soup cups. Boxed or frozen macaroni and cheese. Self-rising flour. Regular salted crackers. Vegetables Regular canned vegetables. Regular canned tomato sauce and paste. Regular tomato and vegetable juices. Frozen vegetables in sauces. Salted french fries. Olives. Angie Fava. Relishes. Sauerkraut. Salsa. Meat and Other Protein Products Salted, canned, smoked, spiced, or pickled meats, seafood, or fish. Bacon, ham, sausage, hot dogs, corned beef, chipped beef, and packaged luncheon meats. Salt pork. Jerky. Pickled herring. Anchovies, regular canned tuna, and sardines. Salted nuts. Dairy Processed cheese and cheese spreads. Cheese curds. Blue cheese and cottage cheese. Buttermilk.  Condiments Onion and garlic salt, seasoned salt, table salt, and sea salt. Canned and packaged gravies. Worcestershire sauce. Tartar sauce. Barbecue sauce. Teriyaki sauce. Soy sauce, including reduced sodium. Steak sauce. Fish sauce. Oyster sauce. Cocktail sauce. Horseradish. Regular ketchup and mustard. Meat flavorings and tenderizers. Bouillon cubes. Hot sauce. Tabasco sauce. Marinades. Taco seasonings. Relishes. Fats and Oils Regular salad dressings. Salted butter. Margarine. Ghee. Bacon fat.  Other Potato and tortilla chips. Corn chips and puffs. Salted popcorn and pretzels. Canned or dried soups. Pizza. Frozen entrees and pot pies.  The items listed above may not be a complete list of foods and beverages to avoid. Contact your dietitian for more information. Document Released: 03/21/2002 Document Revised: 10/04/2013 Document Reviewed: 08/03/2013 [facility] Patient Information 2015 [place], [place]. This information is not intended to replace advice given to you by your health care provider. Make sure you discuss any questions you have with your health care provider.

## 2014-04-06 NOTE — Progress Notes (Signed)
Patient ID: [name], female   DOB: November 24, 1932, 80 y.o.   MRN: [id number]    04/06/2014 [name]   05/26/1933  [id number]  Primary Physicia Cheryl Font, MD Primary Cardiologist: Dr. Gwenlyn Found  HPI:  The patient is a 80 year old African American female, formerly followed by Dr. Rex Kras. She is now followed by Dr. Gwenlyn Found. She has a history of nonischemic cardiomyopathy documented by cardiac catheterization, performed by Dr. Debara Pickett on 05/24/2010. She had minimal CAD with an ejection fraction of 20%. Her other past medical history is significant for hypertension, hyperlipidemia, diabetes and CVA. She had a recent hospitalization in February of this year for congestive heart failure. Pro BNP at that time was 7000 and she was treated with IV diuretics. She was last seen by Dr. Gwenlyn Found for post hospital followup on 12/12/2013. At that time, she was felt to be stable from a cardiac standpoint.   She was apparently hospitalized again from 02/04/2014 to 02/10/2014. She had an acute stroke, possible watershed infarct versus embolic stroke. She also had acute blood loss anemia. She also had a non-ST elevation MI, felt to be likely type II from her anemia. She had a repeat 2-D echocardiogram which demonstrated improvement in systolic function to 71-06%. At times during her hospitalization, it was felt that she was in atrial fibrillation, however review of her EKGs did not show any afib. Given her CVA, there was concern that she may need anticoagulation. Subsequently, she was prescribed a 2 week heart monitor. This was ordered by Cecilie Kicks, NP, on 03/13/14 during post hospital f/u.   I reevaluated Ms. Hurley on 03/24/2014, when she presented back to the office with a complaint of a three-day history of dyspnea at rest, which was worse with exertion. She also noted feeling very fatigued. Given her recent history of GI bleed, I ordered a CBC to ensure that her hemoglobin was stable. Her hemoglobin was 8 g/dL, which  was stable from the time she was discharged from the hospital and hemoglobin was 8.1 g/dL. I also ordered a BNP to see if CHF was the cause of her symptoms. Her BNP was elevated at 972.2. Subsequently, I instructed her to temporarily increase her torsemide from 5 mg to 10 mg for 3 days.   She presents back to clinic today for followup. She is accompanied by her son. Today in clinic, she appears visibly more comfortable. She is not dyspneic with speaking. She states that she is significantly improved since her last office visit. She denies any shortness of breath at rest or with exertion. She also denies further fatigue. She has had no chest pain, pressure, tightness, palpitations or dizziness.  In addition, she has finished wearing her heart monitor. Review of telemetry strips revealed there no episodes of atrial fibrillation. She did have occasional PVCs. She is on a beta blocker and she reports full medication compliance.   Current Outpatient Prescriptions  Medication Sig Dispense Refill  . acetaminophen (TYLENOL) 500 MG tablet Take 500 mg by mouth every 6 (six) hours as needed for moderate pain or headache.      . albuterol (PROAIR HFA) 108 (90 BASE) MCG/ACT inhaler Inhale 2 puffs into the lungs every 6 (six) hours as needed for wheezing.  8.5 g  11  . amLODipine (NORVASC) 10 MG tablet Take 1 tablet (10 mg total) by mouth daily.  30 tablet  2  . aspirin 81 MG tablet Take 81 mg by mouth daily.      Marland Kitchen  atorvastatin (LIPITOR) 40 MG tablet Take 1 tablet (40 mg total) by mouth daily at 6 PM.  30 tablet  11  . ferrous sulfate 325 (65 FE) MG tablet Take 1 tablet (325 mg total) by mouth 2 (two) times daily with a meal.  60 tablet  5  . insulin glargine (LANTUS) 100 UNIT/ML injection Inject 0.1 mLs (10 Units total) into the skin at bedtime.  10 mL  11  . isosorbide mononitrate (IMDUR) 60 MG 24 hr tablet Take 1 tablet (60 mg total) by mouth daily.  30 tablet  2  . KLOR-CON 10 10 MEQ tablet Take 1 tablet by  mouth every other day.      . lisinopril (PRINIVIL,ZESTRIL) 5 MG tablet Take 5 mg by mouth daily.      Marland Kitchen loratadine (ALLERGY RELIEF) 10 MG tablet Take 10 mg by mouth daily.      . magnesium oxide (MAG-OX) 400 MG tablet Take 1 tablet (400 mg total) by mouth 2 (two) times daily. Take 400 mg one tablet two times a day for five days then cut back to once a day after those 5 days  40 tablet  6  . metoprolol succinate (TOPROL-XL) 25 MG 24 hr tablet Take 1 tablet (25 mg total) by mouth daily.  30 tablet  0  . Multiple Vitamins-Minerals (ALIVE WOMENS 50+ PO) Take 1 tablet by mouth daily.      Marland Kitchen omeprazole (PRILOSEC) 20 MG capsule Take 1 capsule (20 mg total) by mouth daily before breakfast.  30 capsule  11  . polyethylene glycol (MIRALAX / GLYCOLAX) packet Take 17 g by mouth daily.      Marland Kitchen torsemide (DEMADEX) 10 MG tablet Take 5 mg by mouth every other day.       . [DISCONTINUED] pantoprazole (PROTONIX) 40 MG tablet Take 1 tablet (40 mg total) by mouth 2 (two) times daily.  60 tablet  0   No current facility-administered medications for this visit.    Allergies  Allergen Reactions  . Baking Soda-Fluoride [Sodium Fluoride] Nausea And Vomiting  . Magnesium Hydroxide Nausea And Vomiting    History   Social History  . Marital Status: Widowed    Spouse Name: N/A    Number of Children: 7  . Years of Education: N/A   Occupational History  . Disabled    Social History Main Topics  . Smoking status: Never Smoker   . Smokeless tobacco: Former Systems developer    Types: Snuff    Quit date: 10/13/2006     Comment: 02/26/12 "stopped snuff 4-6 years ago"  . Alcohol Use: No     Comment: "stopped drinking alcohol ~ 1980's"  . Drug Use: No  . Sexual Activity: No   Other Topics Concern  . Not on file   Social History Narrative   ** Merged History Encounter **       Lives with daughter     Review of Systems: General: negative for chills, fever, night sweats or weight changes.  Cardiovascular: negative  for chest pain, dyspnea on exertion, edema, orthopnea, palpitations, paroxysmal nocturnal dyspnea or shortness of breath Dermatological: negative for rash Respiratory: negative for cough or wheezing Urologic: negative for hematuria Abdominal: negative for nausea, vomiting, diarrhea, bright red blood per rectum, melena, or hematemesis Neurologic: negative for visual changes, syncope, or dizziness All other systems reviewed and are otherwise negative except as noted above.    Blood pressure 166/65, pulse 65, height 5\' 3"  (1.6 m), weight 157 lb  1.6 oz (71.26 kg), last menstrual period 12/19/1968.  General appearance: alert, cooperative and no distress Neck: no JVD Lungs: clear to auscultation bilaterally Heart: regular rate and rhythm and 2/6 SM radiating to the carotids Extremities: no LEE Pulses: 2+ and symmetric Skin: warm and dry Neurologic: Grossly normal  EKG Not performed  ASSESSMENT AND PLAN:   1. Dyspnea: Recent dyspnea was likely secondary to acute on chronic systolic heart failure with BNP of 972. Her hemoglobin was stable. She had positive response to the increase of her diuretic dose. Her symptoms have resolved. She denies any further symptoms of shortness of breath. She has returned back to her original home dose of torsemide, 5 mg every other day. We discussed the importance of adherence to medications, daily weights and maintaining a low-sodium diet.  2. Cardiac event monitoring: Review of telemetry strips do not reveal any evidence of atrial fibrillation. She did have occasional PVCs. We will continue her on her beta blocker.  PLAN  Mrs. Weberg appears to be improved. She has no further complaints. Currently she appears stable from a cardiac standpoint. I have recommended that she followup with her primary cardiologist, Dr. Gwenlyn Found, in 2-3 months for reassessment. She is to continue her current medications as prescribed.  Hadasah Brugger, BRITTAINYPA-C 04/06/2014 8:57 AM

## 2014-04-12 ENCOUNTER — Telehealth (HOSPITAL_COMMUNITY): Payer: Self-pay

## 2014-04-12 NOTE — Telephone Encounter (Signed)
Encounter complete. 

## 2014-04-13 ENCOUNTER — Telehealth (HOSPITAL_COMMUNITY): Payer: Self-pay

## 2014-04-13 NOTE — Telephone Encounter (Signed)
Encounter complete. 

## 2014-04-18 ENCOUNTER — Encounter (HOSPITAL_COMMUNITY): Payer: Self-pay

## 2014-04-22 ENCOUNTER — Telehealth: Payer: Self-pay | Admitting: Cardiovascular Disease

## 2014-04-22 NOTE — Telephone Encounter (Signed)
Closed encounter °

## 2014-04-25 ENCOUNTER — Encounter: Payer: Self-pay | Admitting: Cardiovascular Disease

## 2014-04-25 ENCOUNTER — Ambulatory Visit (INDEPENDENT_AMBULATORY_CARE_PROVIDER_SITE_OTHER): Payer: Medicare Other | Admitting: Cardiovascular Disease

## 2014-04-25 ENCOUNTER — Encounter (HOSPITAL_COMMUNITY): Payer: Self-pay | Admitting: *Deleted

## 2014-04-25 VITALS — BP 150/59 | HR 62 | Ht 63.0 in | Wt 154.0 lb

## 2014-04-25 DIAGNOSIS — I4891 Unspecified atrial fibrillation: Secondary | ICD-10-CM

## 2014-04-25 DIAGNOSIS — I6340 Cerebral infarction due to embolism of unspecified cerebral artery: Secondary | ICD-10-CM

## 2014-04-25 DIAGNOSIS — I428 Other cardiomyopathies: Secondary | ICD-10-CM

## 2014-04-25 DIAGNOSIS — I5031 Acute diastolic (congestive) heart failure: Secondary | ICD-10-CM

## 2014-04-25 DIAGNOSIS — I10 Essential (primary) hypertension: Secondary | ICD-10-CM

## 2014-04-25 DIAGNOSIS — I5032 Chronic diastolic (congestive) heart failure: Secondary | ICD-10-CM

## 2014-04-25 DIAGNOSIS — E785 Hyperlipidemia, unspecified: Secondary | ICD-10-CM

## 2014-04-25 DIAGNOSIS — I509 Heart failure, unspecified: Secondary | ICD-10-CM

## 2014-04-25 DIAGNOSIS — I480 Paroxysmal atrial fibrillation: Secondary | ICD-10-CM

## 2014-04-25 DIAGNOSIS — I214 Non-ST elevation (NSTEMI) myocardial infarction: Secondary | ICD-10-CM

## 2014-04-25 NOTE — Progress Notes (Signed)
04/25/2014 [name]   02/17/1933  [id number]  Primary Physician [name], MD Primary Cardiologist: Lorretta Harp MD Renae Gloss   HPI:  [name] is a 80 year old thin appearing widowed African American female mother of 60 living children accompanied by several family members today. She has been seen by Dr. Aldona Bar in the past. She's a history of nonischemic myopathy documented by cardiac catheterization performed at Dr. Debara Pickett 05/24/10. She had minimal CAD with an ejection fraction of 20%. Her other problems include treated hypertension, hyperlipidemia and diabetes. She has had  a stroke in the past and walks with the aid of a walker. She was recently recently hospitalized from 2/21 to the 23rd  with congestive heart failure. Pro BNP was 7000 and she was treated with IV diuretics. She was hospitalized again in April with a stroke and a non-STEMI. Consideration was given to putting her on oral anticoagulant event monitor was obtained which did not show A. Fib with rapid PVCs and a short burst of nonsustained ventricular tachycardia. She does complain of occasional episodes of chest pain. She was primarily a bed to chair existence in her home and infrequently gets out of the house.    Current Outpatient Prescriptions  Medication Sig Dispense Refill  . acetaminophen (TYLENOL) 500 MG tablet Take 500 mg by mouth every 6 (six) hours as needed for moderate pain or headache.      . albuterol (PROAIR HFA) 108 (90 BASE) MCG/ACT inhaler Inhale 2 puffs into the lungs every 6 (six) hours as needed for wheezing.  8.5 g  11  . amLODipine (NORVASC) 10 MG tablet Take 1 tablet (10 mg total) by mouth daily.  30 tablet  2  . aspirin 81 MG tablet Take 81 mg by mouth daily.      Marland Kitchen atorvastatin (LIPITOR) 40 MG tablet Take 1 tablet (40 mg total) by mouth daily at 6 PM.  30 tablet  11  . ferrous sulfate 325 (65 FE) MG tablet Take 1 tablet (325 mg total) by mouth 2 (two) times daily with a  meal.  60 tablet  5  . insulin glargine (LANTUS) 100 UNIT/ML injection Inject 0.1 mLs (10 Units total) into the skin at bedtime.  10 mL  11  . isosorbide mononitrate (IMDUR) 60 MG 24 hr tablet Take 1 tablet (60 mg total) by mouth daily.  30 tablet  2  . KLOR-CON 10 10 MEQ tablet Take 1 tablet by mouth every other day.      . lisinopril (PRINIVIL,ZESTRIL) 5 MG tablet Take 5 mg by mouth daily.      Marland Kitchen loratadine (ALLERGY RELIEF) 10 MG tablet Take 10 mg by mouth daily.      . magnesium oxide (MAG-OX) 400 MG tablet Take 1 tablet (400 mg total) by mouth 2 (two) times daily. Take 400 mg one tablet two times a day for five days then cut back to once a day after those 5 days  40 tablet  6  . metoprolol succinate (TOPROL-XL) 25 MG 24 hr tablet Take 1 tablet (25 mg total) by mouth daily.  30 tablet  0  . Multiple Vitamins-Minerals (ALIVE WOMENS 50+ PO) Take 1 tablet by mouth daily.      Marland Kitchen omeprazole (PRILOSEC) 20 MG capsule Take 1 capsule (20 mg total) by mouth daily before breakfast.  30 capsule  11  . polyethylene glycol (MIRALAX / GLYCOLAX) packet Take 17 g by mouth daily.      Marland Kitchen  torsemide (DEMADEX) 10 MG tablet Take 5 mg by mouth every other day.       . [DISCONTINUED] pantoprazole (PROTONIX) 40 MG tablet Take 1 tablet (40 mg total) by mouth 2 (two) times daily.  60 tablet  0   No current facility-administered medications for this visit.    Allergies  Allergen Reactions  . Baking Soda-Fluoride [Sodium Fluoride] Nausea And Vomiting  . Magnesium Hydroxide Nausea And Vomiting    History   Social History  . Marital Status: Widowed    Spouse Name: N/A    Number of Children: 7  . Years of Education: N/A   Occupational History  . Disabled    Social History Main Topics  . Smoking status: Never Smoker   . Smokeless tobacco: Former Systems developer    Types: Snuff    Quit date: 10/13/2006     Comment: 02/26/12 "stopped snuff 4-6 years ago"  . Alcohol Use: No     Comment: "stopped drinking alcohol ~  1980's"  . Drug Use: No  . Sexual Activity: No   Other Topics Concern  . Not on file   Social History Narrative   ** Merged History Encounter **       Lives with daughter     Review of Systems: General: negative for chills, fever, night sweats or weight changes.  Cardiovascular: negative for chest pain, dyspnea on exertion, edema, orthopnea, palpitations, paroxysmal nocturnal dyspnea or shortness of breath Dermatological: negative for rash Respiratory: negative for cough or wheezing Urologic: negative for hematuria Abdominal: negative for nausea, vomiting, diarrhea, bright red blood per rectum, melena, or hematemesis Neurologic: negative for visual changes, syncope, or dizziness All other systems reviewed and are otherwise negative except as noted above.    Blood pressure 150/59, pulse 62, height 5\' 3"  (1.6 m), weight 154 lb (69.854 kg), last menstrual period 12/19/1968.  General appearance: alert and no distress Neck: no adenopathy, no JVD, supple, symmetrical, trachea midline, thyroid not enlarged, symmetric, no tenderness/mass/nodules and soft bilateral carotid bruits Lungs: clear to auscultation bilaterally Heart: soft outflow tract murmur Extremities: 1-2+ pitting edema  EKG normal sinus rhythm at 60 with low voltage  ASSESSMENT AND PLAN:   NSTEMI (non-ST elevated myocardial infarction) Recent non-STEMI in April. We will schedule a pharmacologic Myoview stress test to risk stratify her  HYPERTENSION Controlled on current medications  HYPERLIPIDEMIA On statin therapy with recent lipid profile performed/30/15 revealing a total cholesterol of 1:30 and LDL of 58 and HDL of 60  CVA (cerebral infarction) Patient had a recent CVA back in April. She is recovering her function with physical therapy. Her speech has returned. She wants with a walker but is unsteady on her feet. There was a question of atrial fibrillation reminisces not elevated. Recent event monitors did not  show A. Fib it did show some PVCs and short bursts of nonsustained ventricular tachycardia. Her carotid Dopplers showed no evidence of disease. At this point, I think she is at high risk secondary to falls for oral anticoagulation and preferred to keep her on low-dose aspirin.  Nonischemic cardiomyopathy EF recently was measured at 45% by 2-D echocardiogram.      Lorretta Harp MD [facility], [facility] 04/25/2014 10:40 AM

## 2014-04-25 NOTE — Assessment & Plan Note (Signed)
On statin therapy with recent lipid profile performed/30/15 revealing a total cholesterol of 1:30 and LDL of 58 and HDL of 60

## 2014-04-25 NOTE — Patient Instructions (Signed)
We request that you follow-up in: 6 months with Laura Ingold NP and in 1 year with Dr Berry  You will receive a reminder letter in the mail two months in advance. If you don't receive a letter, please call our office to schedule the follow-up appointment.   

## 2014-04-25 NOTE — Assessment & Plan Note (Signed)
Recent non-STEMI in April. We will schedule a pharmacologic Myoview stress test to risk stratify her

## 2014-04-25 NOTE — Assessment & Plan Note (Signed)
EF recently was measured at 45% by 2-D echocardiogram.

## 2014-04-25 NOTE — Assessment & Plan Note (Signed)
Patient had a recent CVA back in April. She is recovering her function with physical therapy. Her speech has returned. She wants with a walker but is unsteady on her feet. There was a question of atrial fibrillation reminisces not elevated. Recent event monitors did not show A. Fib it did show some PVCs and short bursts of nonsustained ventricular tachycardia. Her carotid Dopplers showed no evidence of disease. At this point, I think she is at high risk secondary to falls for oral anticoagulation and preferred to keep her on low-dose aspirin.

## 2014-04-25 NOTE — Assessment & Plan Note (Signed)
Controlled on current medications 

## 2014-04-28 ENCOUNTER — Ambulatory Visit: Payer: Self-pay | Admitting: Cardiovascular Disease

## 2014-05-04 ENCOUNTER — Telehealth (HOSPITAL_COMMUNITY): Payer: Self-pay

## 2014-05-04 NOTE — Telephone Encounter (Signed)
Encounter complete. 

## 2014-05-09 ENCOUNTER — Ambulatory Visit (HOSPITAL_BASED_OUTPATIENT_CLINIC_OR_DEPARTMENT_OTHER)
Admission: RE | Admit: 2014-05-09 | Discharge: 2014-05-09 | Disposition: A | Discharge status: Home / Self Care | Payer: Medicare Other | Source: Ambulatory Visit | Attending: Cardiovascular Disease | Admitting: Cardiovascular Disease

## 2014-05-09 DIAGNOSIS — I214 Non-ST elevation (NSTEMI) myocardial infarction: Secondary | ICD-10-CM | POA: Diagnosis not present

## 2014-05-09 DIAGNOSIS — E119 Type 2 diabetes mellitus without complications: Secondary | ICD-10-CM | POA: Insufficient documentation

## 2014-05-09 DIAGNOSIS — Z8673 Personal history of transient ischemic attack (TIA), and cerebral infarction without residual deficits: Secondary | ICD-10-CM | POA: Insufficient documentation

## 2014-05-09 DIAGNOSIS — R079 Chest pain, unspecified: Secondary | ICD-10-CM | POA: Insufficient documentation

## 2014-05-09 DIAGNOSIS — E669 Obesity, unspecified: Secondary | ICD-10-CM

## 2014-05-09 DIAGNOSIS — I498 Other specified cardiac arrhythmias: Secondary | ICD-10-CM

## 2014-05-09 DIAGNOSIS — R42 Dizziness and giddiness: Secondary | ICD-10-CM | POA: Insufficient documentation

## 2014-05-09 DIAGNOSIS — J45909 Unspecified asthma, uncomplicated: Secondary | ICD-10-CM

## 2014-05-09 DIAGNOSIS — I252 Old myocardial infarction: Secondary | ICD-10-CM

## 2014-05-09 DIAGNOSIS — R5381 Other malaise: Secondary | ICD-10-CM | POA: Diagnosis not present

## 2014-05-09 DIAGNOSIS — I4891 Unspecified atrial fibrillation: Secondary | ICD-10-CM | POA: Insufficient documentation

## 2014-05-09 DIAGNOSIS — I2510 Atherosclerotic heart disease of native coronary artery without angina pectoris: Secondary | ICD-10-CM | POA: Insufficient documentation

## 2014-05-09 DIAGNOSIS — R5383 Other fatigue: Secondary | ICD-10-CM | POA: Diagnosis not present

## 2014-05-09 DIAGNOSIS — I509 Heart failure, unspecified: Secondary | ICD-10-CM

## 2014-05-09 DIAGNOSIS — I10 Essential (primary) hypertension: Secondary | ICD-10-CM | POA: Insufficient documentation

## 2014-05-09 MED ORDER — TECHNETIUM TC 99M SESTAMIBI GENERIC - CARDIOLITE
10.2000 | Freq: Once | INTRAVENOUS | Status: AC | PRN
  Administered 2014-05-09: 10 via INTRAVENOUS

## 2014-05-09 MED ORDER — REGADENOSON 0.4 MG/5ML IV SOLN
0.4000 mg | Freq: Once | INTRAVENOUS | Status: AC
  Administered 2014-05-09: 0.4 mg via INTRAVENOUS

## 2014-05-09 MED ORDER — AMINOPHYLLINE 25 MG/ML IV SOLN
75.0000 mg | Freq: Once | INTRAVENOUS | Status: AC
  Administered 2014-05-09: 75 mg via INTRAVENOUS

## 2014-05-09 MED ORDER — TECHNETIUM TC 99M SESTAMIBI GENERIC - CARDIOLITE
30.1000 | Freq: Once | INTRAVENOUS | Status: AC | PRN
  Administered 2014-05-09: 30.1 via INTRAVENOUS

## 2014-05-09 NOTE — Procedures (Addendum)
[facility] [address] [place] [address] [phone]  Cardiology Nuclear Med Study  [name] is a 80 y.o. female     MRN : [id number]     DOB: 1932-10-31  Procedure Date: 05/09/2014  Nuclear Med Background Indication for Stress Test:  Evaluation for Ischemia History:  Asthma and CAD;MI-04/15;NICM;AFIB;CVA;CHF;NSVT;Last NUC MPI obn 08/07/2006-nonischemic/scar. Cardiac Risk Factors: CVA, Hypertension, IDDM Type 2, Lipids and Overweight  Symptoms:  Chest Pain, DOE, Fatigue, Light-Headedness and SOB   Nuclear Pre-Procedure Caffeine/Decaff Intake:  9:00pm NPO After: 7:00am   IV Site: L Hand  IV 0.9% NS with Angio Cath:  22g  Chest Size (in):  n/a IV Started by: Rolene Course, RN  Height: 5\' 3"  (1.6 m)  Cup Size: B  BMI:  Body mass index is 27.29 kg/(m^2). Weight:  154 lb (69.854 kg)   Tech Comments:  n/a    Nuclear Med Study 1 or 2 day study: 1 day  Stress Test Type:  [place] Provider:  Quay Burow, MD   Resting Radionuclide: Technetium 45m Sestamibi  Resting Radionuclide Dose: 10.2 mCi   Stress Radionuclide:  Technetium 84m Sestamibi  Stress Radionuclide Dose: 30.1 mCi           Stress Protocol Rest HR: 61 Stress HR: 62  Rest BP: 144/57 Stress BP: 143/49  Exercise Time (min): n/a METS: n/a   Predicted Max HR: 142 bpm % Max HR: 45.77 bpm Rate Pressure Product: 9815  Dose of Adenosine (mg):  n/a Dose of Lexiscan: 0.4 mg  Dose of Atropine (mg): n/a Dose of Dobutamine: n/a mcg/kg/min (at max HR)  Stress Test Technologist: Leane Para, CCT Nuclear Technologist: Imagene Riches, CNMT   Rest Procedure:  Myocardial perfusion imaging was performed at rest 45 minutes following the intravenous administration of Technetium 68m Sestamibi. Stress Procedure:  The patient received IV Lexiscan 0.4 mg over 15-seconds.  Technetium 40m Sestamibi injected IV at 30-seconds.  Patient experienced  increased SOB and 75 mg Aminophylline IV was administered.  There were no significant changes with Lexiscan.  Quantitative spect images were obtained after a 45 minute delay.  Transient Ischemic Dilatation (Normal <1.22):  1.05  QGS EDV:  143 ml QGS ESV:  81 ml LV Ejection Fraction: 44%       Rest ECG: NSR-LVH  Stress ECG: No significant change from baseline ECG  QPS Raw Data Images:  Normal; no motion artifact; normal heart/lung ratio. Stress Images:  There is decreased uptake in the inferior wall. Rest Images:  Comparison with the stress images reveals no significant change. Subtraction (SDS):  There is a fixed defect that is most consistent with a previous infarction. LV Wall Motion:  Inferior hypokinesis. Mildly decreased overall LV function, EF 44%  Impression Exercise Capacity:  Lexiscan with no exercise. BP Response:  Normal blood pressure response. Clinical Symptoms:  No significant symptoms noted. ECG Impression:  No significant ECG changes with Lexiscan. Comparison with Prior Nuclear Study: Previous study reported inferolateral, apical and inferior scar   Overall Impression:  Low risk stress nuclear study with an inferior wall scar and mildly depressed LV systolic function.   Sanda Klein, MD  05/09/2014 1:31 PM

## 2014-05-10 ENCOUNTER — Emergency Department (HOSPITAL_COMMUNITY): Payer: Medicare Other

## 2014-05-10 ENCOUNTER — Inpatient Hospital Stay (HOSPITAL_COMMUNITY)
Admission: EM | Admit: 2014-05-10 | Discharge: 2014-05-15 | DRG: 280 | Disposition: A | Discharge status: Rehabilitation Facility | Payer: Medicare Other | Attending: Internal Medicine | Admitting: Internal Medicine

## 2014-05-10 ENCOUNTER — Inpatient Hospital Stay (HOSPITAL_COMMUNITY): Payer: Medicare Other

## 2014-05-10 ENCOUNTER — Encounter (HOSPITAL_COMMUNITY): Payer: Self-pay | Admitting: Emergency Medicine

## 2014-05-10 DIAGNOSIS — M545 Low back pain, unspecified: Secondary | ICD-10-CM | POA: Diagnosis present

## 2014-05-10 DIAGNOSIS — E1165 Type 2 diabetes mellitus with hyperglycemia: Secondary | ICD-10-CM

## 2014-05-10 DIAGNOSIS — G8929 Other chronic pain: Secondary | ICD-10-CM | POA: Diagnosis present

## 2014-05-10 DIAGNOSIS — K5520 Angiodysplasia of colon without hemorrhage: Secondary | ICD-10-CM

## 2014-05-10 DIAGNOSIS — G609 Hereditary and idiopathic neuropathy, unspecified: Secondary | ICD-10-CM | POA: Diagnosis present

## 2014-05-10 DIAGNOSIS — D500 Iron deficiency anemia secondary to blood loss (chronic): Secondary | ICD-10-CM | POA: Diagnosis present

## 2014-05-10 DIAGNOSIS — Z8542 Personal history of malignant neoplasm of other parts of uterus: Secondary | ICD-10-CM | POA: Diagnosis not present

## 2014-05-10 DIAGNOSIS — I2789 Other specified pulmonary heart diseases: Secondary | ICD-10-CM | POA: Diagnosis present

## 2014-05-10 DIAGNOSIS — Z9849 Cataract extraction status, unspecified eye: Secondary | ICD-10-CM

## 2014-05-10 DIAGNOSIS — I252 Old myocardial infarction: Secondary | ICD-10-CM | POA: Diagnosis not present

## 2014-05-10 DIAGNOSIS — K219 Gastro-esophageal reflux disease without esophagitis: Secondary | ICD-10-CM | POA: Diagnosis present

## 2014-05-10 DIAGNOSIS — N184 Chronic kidney disease, stage 4 (severe): Secondary | ICD-10-CM | POA: Diagnosis present

## 2014-05-10 DIAGNOSIS — K2961 Other gastritis with bleeding: Secondary | ICD-10-CM

## 2014-05-10 DIAGNOSIS — G4733 Obstructive sleep apnea (adult) (pediatric): Secondary | ICD-10-CM | POA: Diagnosis present

## 2014-05-10 DIAGNOSIS — Z5189 Encounter for other specified aftercare: Secondary | ICD-10-CM | POA: Diagnosis not present

## 2014-05-10 DIAGNOSIS — I6389 Other cerebral infarction: Secondary | ICD-10-CM

## 2014-05-10 DIAGNOSIS — J45909 Unspecified asthma, uncomplicated: Secondary | ICD-10-CM | POA: Diagnosis present

## 2014-05-10 DIAGNOSIS — I509 Heart failure, unspecified: Secondary | ICD-10-CM | POA: Diagnosis present

## 2014-05-10 DIAGNOSIS — Z8673 Personal history of transient ischemic attack (TIA), and cerebral infarction without residual deficits: Secondary | ICD-10-CM

## 2014-05-10 DIAGNOSIS — G9340 Encephalopathy, unspecified: Secondary | ICD-10-CM | POA: Diagnosis present

## 2014-05-10 DIAGNOSIS — I5042 Chronic combined systolic (congestive) and diastolic (congestive) heart failure: Secondary | ICD-10-CM | POA: Diagnosis present

## 2014-05-10 DIAGNOSIS — I6340 Cerebral infarction due to embolism of unspecified cerebral artery: Secondary | ICD-10-CM

## 2014-05-10 DIAGNOSIS — M810 Age-related osteoporosis without current pathological fracture: Secondary | ICD-10-CM | POA: Diagnosis present

## 2014-05-10 DIAGNOSIS — I959 Hypotension, unspecified: Secondary | ICD-10-CM | POA: Diagnosis present

## 2014-05-10 DIAGNOSIS — I499 Cardiac arrhythmia, unspecified: Secondary | ICD-10-CM

## 2014-05-10 DIAGNOSIS — R413 Other amnesia: Secondary | ICD-10-CM

## 2014-05-10 DIAGNOSIS — Z87891 Personal history of nicotine dependence: Secondary | ICD-10-CM | POA: Diagnosis not present

## 2014-05-10 DIAGNOSIS — Z800 Family history of malignant neoplasm of digestive organs: Secondary | ICD-10-CM | POA: Diagnosis not present

## 2014-05-10 DIAGNOSIS — G4700 Insomnia, unspecified: Secondary | ICD-10-CM

## 2014-05-10 DIAGNOSIS — IMO0001 Reserved for inherently not codable concepts without codable children: Secondary | ICD-10-CM | POA: Diagnosis present

## 2014-05-10 DIAGNOSIS — Z7982 Long term (current) use of aspirin: Secondary | ICD-10-CM | POA: Diagnosis not present

## 2014-05-10 DIAGNOSIS — I428 Other cardiomyopathies: Secondary | ICD-10-CM | POA: Diagnosis present

## 2014-05-10 DIAGNOSIS — R5383 Other fatigue: Secondary | ICD-10-CM | POA: Diagnosis present

## 2014-05-10 DIAGNOSIS — G43909 Migraine, unspecified, not intractable, without status migrainosus: Secondary | ICD-10-CM | POA: Diagnosis present

## 2014-05-10 DIAGNOSIS — I129 Hypertensive chronic kidney disease with stage 1 through stage 4 chronic kidney disease, or unspecified chronic kidney disease: Secondary | ICD-10-CM | POA: Diagnosis present

## 2014-05-10 DIAGNOSIS — I472 Ventricular tachycardia, unspecified: Secondary | ICD-10-CM | POA: Diagnosis present

## 2014-05-10 DIAGNOSIS — Z79899 Other long term (current) drug therapy: Secondary | ICD-10-CM

## 2014-05-10 DIAGNOSIS — I10 Essential (primary) hypertension: Secondary | ICD-10-CM | POA: Diagnosis not present

## 2014-05-10 DIAGNOSIS — K921 Melena: Secondary | ICD-10-CM | POA: Diagnosis present

## 2014-05-10 DIAGNOSIS — Z794 Long term (current) use of insulin: Secondary | ICD-10-CM

## 2014-05-10 DIAGNOSIS — I69993 Ataxia following unspecified cerebrovascular disease: Secondary | ICD-10-CM

## 2014-05-10 DIAGNOSIS — R4182 Altered mental status, unspecified: Secondary | ICD-10-CM

## 2014-05-10 DIAGNOSIS — I639 Cerebral infarction, unspecified: Secondary | ICD-10-CM

## 2014-05-10 DIAGNOSIS — K922 Gastrointestinal hemorrhage, unspecified: Secondary | ICD-10-CM

## 2014-05-10 DIAGNOSIS — E1149 Type 2 diabetes mellitus with other diabetic neurological complication: Secondary | ICD-10-CM

## 2014-05-10 DIAGNOSIS — K5521 Angiodysplasia of colon with hemorrhage: Secondary | ICD-10-CM

## 2014-05-10 DIAGNOSIS — I5031 Acute diastolic (congestive) heart failure: Secondary | ICD-10-CM

## 2014-05-10 DIAGNOSIS — I4891 Unspecified atrial fibrillation: Secondary | ICD-10-CM | POA: Diagnosis present

## 2014-05-10 DIAGNOSIS — I2510 Atherosclerotic heart disease of native coronary artery without angina pectoris: Secondary | ICD-10-CM | POA: Diagnosis present

## 2014-05-10 DIAGNOSIS — I4729 Other ventricular tachycardia: Secondary | ICD-10-CM | POA: Diagnosis present

## 2014-05-10 DIAGNOSIS — D638 Anemia in other chronic diseases classified elsewhere: Secondary | ICD-10-CM | POA: Diagnosis present

## 2014-05-10 DIAGNOSIS — I498 Other specified cardiac arrhythmias: Secondary | ICD-10-CM

## 2014-05-10 DIAGNOSIS — I5032 Chronic diastolic (congestive) heart failure: Secondary | ICD-10-CM

## 2014-05-10 DIAGNOSIS — I214 Non-ST elevation (NSTEMI) myocardial infarction: Principal | ICD-10-CM | POA: Diagnosis present

## 2014-05-10 DIAGNOSIS — E785 Hyperlipidemia, unspecified: Secondary | ICD-10-CM | POA: Diagnosis present

## 2014-05-10 DIAGNOSIS — E119 Type 2 diabetes mellitus without complications: Secondary | ICD-10-CM

## 2014-05-10 DIAGNOSIS — D649 Anemia, unspecified: Secondary | ICD-10-CM | POA: Diagnosis present

## 2014-05-10 DIAGNOSIS — Z66 Do not resuscitate: Secondary | ICD-10-CM | POA: Diagnosis not present

## 2014-05-10 DIAGNOSIS — M1990 Unspecified osteoarthritis, unspecified site: Secondary | ICD-10-CM

## 2014-05-10 DIAGNOSIS — R710 Precipitous drop in hematocrit: Secondary | ICD-10-CM

## 2014-05-10 DIAGNOSIS — I6330 Cerebral infarction due to thrombosis of unspecified cerebral artery: Secondary | ICD-10-CM | POA: Diagnosis not present

## 2014-05-10 DIAGNOSIS — IMO0002 Reserved for concepts with insufficient information to code with codable children: Secondary | ICD-10-CM

## 2014-05-10 DIAGNOSIS — R5381 Other malaise: Secondary | ICD-10-CM | POA: Diagnosis present

## 2014-05-10 HISTORY — DX: Angiodysplasia of colon with hemorrhage: K55.21

## 2014-05-10 LAB — CBC
HCT: 16.5 % — ABNORMAL LOW (ref 36.0–46.0)
HCT: 15.8 % — ABNORMAL LOW (ref 36.0–46.0)
HEMOGLOBIN: 4.7 g/dL — AB (ref 12.0–15.0)
Hemoglobin: 4.9 g/dL — CL (ref 12.0–15.0)
MCH: 24 pg — ABNORMAL LOW (ref 26.0–34.0)
MCH: 24.4 pg — AB (ref 26.0–34.0)
MCHC: 29.7 g/dL — ABNORMAL LOW (ref 30.0–36.0)
MCHC: 29.7 g/dL — ABNORMAL LOW (ref 30.0–36.0)
MCV: 80.9 fL (ref 78.0–100.0)
MCV: 81.9 fL (ref 78.0–100.0)
Platelets: 474 10*3/uL — ABNORMAL HIGH (ref 150–400)
Platelets: 442 10*3/uL — ABNORMAL HIGH (ref 150–400)
RBC: 2.04 MIL/uL — AB (ref 3.87–5.11)
RBC: 1.93 MIL/uL — AB (ref 3.87–5.11)
RDW: 19.7 % — ABNORMAL HIGH (ref 11.5–15.5)
RDW: 20 % — ABNORMAL HIGH (ref 11.5–15.5)
WBC: 10.7 10*3/uL — ABNORMAL HIGH (ref 4.0–10.5)
WBC: 10.5 10*3/uL (ref 4.0–10.5)

## 2014-05-10 LAB — GLUCOSE, CAPILLARY
GLUCOSE-CAPILLARY: 339 mg/dL — AB (ref 70–99)
Glucose-Capillary: 282 mg/dL — ABNORMAL HIGH (ref 70–99)

## 2014-05-10 LAB — PROTIME-INR
INR: 1.41 (ref 0.00–1.49)
Prothrombin Time: 17.3 seconds — ABNORMAL HIGH (ref 11.6–15.2)

## 2014-05-10 LAB — COMPREHENSIVE METABOLIC PANEL
ALT: 78 U/L — AB (ref 0–35)
AST: 73 U/L — ABNORMAL HIGH (ref 0–37)
Albumin: 2.7 g/dL — ABNORMAL LOW (ref 3.5–5.2)
Alkaline Phosphatase: 185 U/L — ABNORMAL HIGH (ref 39–117)
Anion gap: 21 — ABNORMAL HIGH (ref 5–15)
BUN: 56 mg/dL — ABNORMAL HIGH (ref 6–23)
CO2: 14 meq/L — AB (ref 19–32)
Calcium: 8.8 mg/dL (ref 8.4–10.5)
Chloride: 103 mEq/L (ref 96–112)
Creatinine, Ser: 2.98 mg/dL — ABNORMAL HIGH (ref 0.50–1.10)
GFR calc Af Amer: 16 mL/min — ABNORMAL LOW (ref >90)
GFR, EST NON AFRICAN AMERICAN: 14 mL/min — AB (ref >90)
Glucose, Bld: 218 mg/dL — ABNORMAL HIGH (ref 70–99)
Potassium: 5.3 mEq/L (ref 3.7–5.3)
SODIUM: 138 meq/L (ref 137–147)
TOTAL PROTEIN: 6.8 g/dL (ref 6.0–8.3)
Total Bilirubin: 0.4 mg/dL (ref 0.3–1.2)

## 2014-05-10 LAB — URINALYSIS, ROUTINE W REFLEX MICROSCOPIC
Bilirubin Urine: NEGATIVE
Glucose, UA: NEGATIVE mg/dL
Hgb urine dipstick: NEGATIVE
KETONES UR: NEGATIVE mg/dL
LEUKOCYTES UA: NEGATIVE
Nitrite: NEGATIVE
PROTEIN: 30 mg/dL — AB
Specific Gravity, Urine: 1.017 (ref 1.005–1.030)
UROBILINOGEN UA: 0.2 mg/dL (ref 0.0–1.0)
pH: 5 (ref 5.0–8.0)

## 2014-05-10 LAB — I-STAT TROPONIN, ED: TROPONIN I, POC: 0.28 ng/mL — AB (ref 0.00–0.08)

## 2014-05-10 LAB — TROPONIN I
TROPONIN I: 0.46 ng/mL — AB (ref <0.30)
TROPONIN I: 0.39 ng/mL — AB (ref <0.30)
Troponin I: 0.57 ng/mL (ref <0.30)

## 2014-05-10 LAB — URINE MICROSCOPIC-ADD ON

## 2014-05-10 LAB — AMMONIA: Ammonia: 29 umol/L (ref 11–60)

## 2014-05-10 LAB — MRSA PCR SCREENING: MRSA by PCR: NEGATIVE

## 2014-05-10 LAB — PRO B NATRIURETIC PEPTIDE: Pro B Natriuretic peptide (BNP): 14464 pg/mL — ABNORMAL HIGH (ref 0–450)

## 2014-05-10 LAB — PREPARE RBC (CROSSMATCH)

## 2014-05-10 LAB — I-STAT CG4 LACTIC ACID, ED: Lactic Acid, Venous: 2.56 mmol/L — ABNORMAL HIGH (ref 0.5–2.2)

## 2014-05-10 LAB — CBG MONITORING, ED: GLUCOSE-CAPILLARY: 232 mg/dL — AB (ref 70–99)

## 2014-05-10 MED ORDER — FERROUS SULFATE 325 (65 FE) MG PO TABS
325.0000 mg | ORAL_TABLET | Freq: Two times a day (BID) | ORAL | Status: DC
  Filled 2014-05-10 (×2): qty 1

## 2014-05-10 MED ORDER — SODIUM CHLORIDE 0.9 % IV BOLUS (SEPSIS)
1000.0000 mL | Freq: Once | INTRAVENOUS | Status: DC
  Administered 2014-05-10: 1000 mL via INTRAVENOUS

## 2014-05-10 MED ORDER — METOPROLOL SUCCINATE ER 25 MG PO TB24
25.0000 mg | ORAL_TABLET | Freq: Every day | ORAL | Status: DC
  Administered 2014-05-11 – 2014-05-15 (×5): 25 mg via ORAL
  Filled 2014-05-10 (×5): qty 1

## 2014-05-10 MED ORDER — ALBUTEROL SULFATE HFA 108 (90 BASE) MCG/ACT IN AERS: 2.0000 | INHALATION_SPRAY | Freq: Four times a day (QID) | RESPIRATORY_TRACT | Status: DC | PRN

## 2014-05-10 MED ORDER — INSULIN GLARGINE 100 UNIT/ML ~~LOC~~ SOLN
5.0000 [IU] | Freq: Every day | SUBCUTANEOUS | Status: DC
  Administered 2014-05-10: 5 [IU] via SUBCUTANEOUS
  Filled 2014-05-10 (×2): qty 0.05

## 2014-05-10 MED ORDER — PANTOPRAZOLE SODIUM 40 MG PO TBEC
40.0000 mg | DELAYED_RELEASE_TABLET | Freq: Two times a day (BID) | ORAL | Status: DC
  Administered 2014-05-10 – 2014-05-12 (×5): 40 mg via ORAL
  Filled 2014-05-10 (×5): qty 1

## 2014-05-10 MED ORDER — MAGNESIUM OXIDE 400 (241.3 MG) MG PO TABS
400.0000 mg | ORAL_TABLET | Freq: Two times a day (BID) | ORAL | Status: DC
  Administered 2014-05-10 – 2014-05-15 (×10): 400 mg via ORAL
  Filled 2014-05-10 (×12): qty 1

## 2014-05-10 MED ORDER — ATORVASTATIN CALCIUM 40 MG PO TABS
40.0000 mg | ORAL_TABLET | Freq: Every day | ORAL | Status: DC
  Administered 2014-05-10 – 2014-05-14 (×5): 40 mg via ORAL
  Filled 2014-05-10 (×6): qty 1

## 2014-05-10 MED ORDER — SODIUM CHLORIDE 0.9 % IV SOLN: INTRAVENOUS | Status: AC

## 2014-05-10 MED ORDER — ISOSORBIDE MONONITRATE ER 60 MG PO TB24
60.0000 mg | ORAL_TABLET | Freq: Every day | ORAL | Status: DC
  Administered 2014-05-10 – 2014-05-15 (×6): 60 mg via ORAL
  Filled 2014-05-10 (×6): qty 1

## 2014-05-10 MED ORDER — ACETAMINOPHEN 325 MG PO TABS
650.0000 mg | ORAL_TABLET | Freq: Four times a day (QID) | ORAL | Status: DC | PRN
  Administered 2014-05-10: 650 mg via ORAL
  Filled 2014-05-10: qty 2

## 2014-05-10 MED ORDER — SODIUM CHLORIDE 0.9 % IJ SOLN
3.0000 mL | Freq: Two times a day (BID) | INTRAMUSCULAR | Status: DC
  Administered 2014-05-11 – 2014-05-13 (×2): 3 mL via INTRAVENOUS

## 2014-05-10 MED ORDER — INSULIN ASPART 100 UNIT/ML ~~LOC~~ SOLN
0.0000 [IU] | Freq: Three times a day (TID) | SUBCUTANEOUS | Status: DC
  Administered 2014-05-10: 5 [IU] via SUBCUTANEOUS

## 2014-05-10 MED ORDER — LISINOPRIL 5 MG PO TABS
5.0000 mg | ORAL_TABLET | Freq: Every day | ORAL | Status: DC
  Administered 2014-05-11 – 2014-05-15 (×5): 5 mg via ORAL
  Filled 2014-05-10 (×5): qty 1

## 2014-05-10 MED ORDER — MAGNESIUM OXIDE 400 MG PO TABS
400.0000 mg | ORAL_TABLET | Freq: Two times a day (BID) | ORAL | Status: DC
Start: 2014-05-10 — End: 2014-05-10

## 2014-05-10 MED ORDER — PANTOPRAZOLE SODIUM 40 MG IV SOLR
40.0000 mg | Freq: Two times a day (BID) | INTRAVENOUS | Status: DC
  Filled 2014-05-10: qty 40

## 2014-05-10 MED ORDER — AMLODIPINE BESYLATE 10 MG PO TABS
10.0000 mg | ORAL_TABLET | Freq: Every day | ORAL | Status: DC
  Filled 2014-05-10: qty 1

## 2014-05-10 MED ORDER — BISACODYL 5 MG PO TBEC
5.0000 mg | DELAYED_RELEASE_TABLET | Freq: Two times a day (BID) | ORAL | Status: AC
  Administered 2014-05-11: 5 mg via ORAL
  Filled 2014-05-10 (×2): qty 1

## 2014-05-10 MED ORDER — ALBUTEROL SULFATE (2.5 MG/3ML) 0.083% IN NEBU: 2.5000 mg | INHALATION_SOLUTION | Freq: Four times a day (QID) | RESPIRATORY_TRACT | Status: DC | PRN

## 2014-05-10 MED ORDER — PANTOPRAZOLE SODIUM 40 MG IV SOLR
40.0000 mg | Freq: Once | INTRAVENOUS | Status: AC
  Administered 2014-05-10: 40 mg via INTRAVENOUS
  Filled 2014-05-10: qty 40

## 2014-05-10 NOTE — ED Notes (Signed)
Attmepted IV with RN Doroteo Bradford x3. Unable to obtain second IV. MD Mingo Amber made aware.

## 2014-05-10 NOTE — ED Notes (Signed)
Lactic acid and troponin results given to Dr. Mingo Amber

## 2014-05-10 NOTE — ED Provider Notes (Signed)
CSN: [id number]     Arrival date & time 05/10/14  0618 History   First MD Initiated Contact with Patient 05/10/14 0701     Chief Complaint  Patient presents with  . Generalized Body Aches  . Leg Pain  . Shortness of Breath     (Consider location/radiation/quality/duration/timing/severity/associated sxs/prior Treatment) Patient is a 80 y.o. female presenting with shortness of breath and weakness. The history is provided by the patient and a relative.  Shortness of Breath Associated symptoms: chest pain ("sharp")   Associated symptoms: no abdominal pain   Weakness This is a new problem. The current episode started 6 to 12 hours ago. The problem occurs constantly. The problem has been gradually worsening. Associated symptoms include chest pain ("sharp") and shortness of breath. Pertinent negatives include no abdominal pain. Nothing aggravates the symptoms. Nothing relieves the symptoms.    Past Medical History  Diagnosis Date  . Asthma   . CAD (coronary artery disease)     Pt reports MI in 2006 (no documentation).  Cardiolite in 05/2002 and 07/2006 did not reveal any reversible ischemia.  Pt follows with Dr. Rex Kras at [facility].  . CHF (congestive heart failure)     EF 25-30% with dilated LV, mild LVH, severe hypokinesis, and mod-severe reduction in RV function  . Osteoporosis   . HYPERTENSION 08/03/2006  . GASTROPARESIS, DIABETIC 08/03/2006  . HYPERLIPIDEMIA 08/03/2006  . OBSTRUCTIVE SLEEP APNEA 01/06/2008  . PERIPHERAL NEUROPATHY 08/03/2006  . GERD 08/03/2006  . LOW BACK PAIN, CHRONIC 08/03/2006  . OSTEOPOROSIS 03/21/2009  . CEREBRAL EMBOLISM, WITH INFARCTION 07/02/2010  . Angina   . Myocardial infarction "2 or 3"  . Pneumonia 02/26/12    "a few times; probably even today"  . Shortness of breath     "all the time"  . DIABETES MELLITUS, TYPE II 11/04/1983  . Blood transfusion 08/2011  . Lower GI bleeding 08/2011 and 01/2014  . Chronic daily headache   . Migraines   . Stroke summer  2011    "made my left hip worse"  . Uterine cancer   . Nonischemic cardiomyopathy 05/2010    Left heart catheterization:2011. Nonobstructive coronary artery disease.  . Pulmonary hypertension   . Hypertension   . NSTEMI (non-ST elevated myocardial infarction) 01/2014    type II with CVA  . Acute embolic stroke 12/01/7586  . Non-ST elevation MI (NSTEMI) 02/04/2014  . E. coli UTI 03/02/2014  . Renal failure, acute 02/04/2014  . Iron deficiency anemia secondary to blood loss (chronic) 01/06/2008    Qualifier: Diagnosis of  By: Tomasa Hosteller MD, Veronique D.   . Nonsustained ventricular tachycardia    Past Surgical History  Procedure Laterality Date  . Esophagogastroduodenoscopy  08/26/2011    Procedure: ESOPHAGOGASTRODUODENOSCOPY (EGD);  Surgeon: [name], MD;  Location: [facility] ENDOSCOPY;  Service: Endoscopy;  Laterality: N/A;  . Colonoscopy  08/28/2011    Procedure: COLONOSCOPY;  Surgeon: [name], MD;  Location: [facility];  Service: Endoscopy;  Laterality: N/A;  . Vaginal hysterectomy    . Tubal ligation    . Cataract extraction w/ intraocular lens  implant, bilateral    . Toe surgery      "right big toe; operated on it to straighten it out; it was under"  . Polysomnogram  10/17/2005    AHI-7.28/hr. AHI REM-20.8/hr. Average oxygen saturation range during REM and NREM was 97%. Lowest oxygen saturation during REM sleep was 90%.  . Carotid duplex  05/28/2010    No significant extracranial carotid  artery stenosis demonstrated. Vertebrals are patent w/ antegrade flow.  . Cardiac catheterization  05/24/2010    No intervention - recommed medical therapy.  . Cardiovascular stress test  08/07/2006    Moderate-severe defect seen in Basal inferior, Mid inferoseptal, Mid inferior, Mid inferolateral, and Apical inferior regions - consistent w/ infarct/scar. No scintigraphic evidence of inducible myocardial ischemia.  . Transthoracic echocardiogram  08/29/2011    EF 55-60%, moderate LVH,   .  Esophagogastroduodenoscopy N/A 02/08/2014    Procedure: ESOPHAGOGASTRODUODENOSCOPY (EGD);  Surgeon: [name], MD;  Location: [facility] ENDOSCOPY;  Service: Endoscopy;  Laterality: N/A;   Family History  Problem Relation Age of Onset  . Diabetes insipidus Mother   . Hypertension Mother   . Hypertension Father   . Hypertension Sister   . Hypertension Child   . Stomach cancer Brother    History  Substance Use Topics  . Smoking status: Never Smoker   . Smokeless tobacco: Former Systems developer    Types: Snuff    Quit date: 10/13/2006     Comment: 02/26/12 "stopped snuff 4-6 years ago"  . Alcohol Use: No     Comment: "stopped drinking alcohol ~ 1980's"   OB History   Grav Para Term Preterm Abortions TAB SAB Ect Mult Living                 Review of Systems  Unable to perform ROS: Acuity of condition  Respiratory: Positive for shortness of breath.   Cardiovascular: Positive for chest pain ("sharp").  Gastrointestinal: Negative for abdominal pain.  Neurological: Positive for weakness.      Allergies  Baking soda-fluoride and Magnesium hydroxide  Home Medications   Prior to Admission medications   Medication Sig Start Date End Date Taking? Authorizing Provider  acetaminophen (TYLENOL) 500 MG tablet Take 500 mg by mouth every 6 (six) hours as needed for moderate pain or headache.   Yes Historical Provider, MD  albuterol (PROAIR HFA) 108 (90 BASE) MCG/ACT inhaler Inhale 2 puffs into the lungs every 6 (six) hours as needed for wheezing. 02/17/14  Yes Daniel J Angiulli, PA-C  amLODipine (NORVASC) 10 MG tablet Take 1 tablet (10 mg total) by mouth daily. 02/17/14  Yes Daniel J Angiulli, PA-C  aspirin 81 MG tablet Take 81 mg by mouth daily.   Yes Historical Provider, MD  atorvastatin (LIPITOR) 40 MG tablet Take 1 tablet (40 mg total) by mouth daily at 6 PM. 04/06/14  Yes Brittainy Rosita Fire, PA-C  ferrous sulfate 325 (65 FE) MG tablet Take 1 tablet (325 mg total) by mouth 2 (two) times daily with a  meal. 03/16/14  Yes [name], MD  insulin glargine (LANTUS) 100 UNIT/ML injection Inject 0.1 mLs (10 Units total) into the skin at bedtime. 02/17/14  Yes Daniel J Angiulli, PA-C  isosorbide mononitrate (IMDUR) 60 MG 24 hr tablet Take 1 tablet (60 mg total) by mouth daily. 02/17/14  Yes Daniel J Angiulli, PA-C  KLOR-CON 10 10 MEQ tablet Take 1 tablet by mouth every other day. 01/07/14  Yes Historical Provider, MD  lisinopril (PRINIVIL,ZESTRIL) 5 MG tablet Take 5 mg by mouth daily.   Yes Historical Provider, MD  loratadine (ALLERGY RELIEF) 10 MG tablet Take 10 mg by mouth daily.   Yes Historical Provider, MD  magnesium oxide (MAG-OX) 400 MG tablet Take 1 tablet (400 mg total) by mouth 2 (two) times daily. Take 400 mg one tablet two times a day for five days then cut back to once a day  after those 5 days 03/14/14  Yes Cecilie Kicks, NP  metoprolol succinate (TOPROL-XL) 25 MG 24 hr tablet Take 1 tablet (25 mg total) by mouth daily. 02/17/14  Yes Daniel J Angiulli, PA-C  Multiple Vitamins-Minerals (ALIVE WOMENS 50+ PO) Take 1 tablet by mouth daily.   Yes Historical Provider, MD  omeprazole (PRILOSEC) 20 MG capsule Take 1 capsule (20 mg total) by mouth daily before breakfast. 03/16/14  Yes [name], MD  polyethylene glycol [facility] / GLYCOLAX) packet Take 17 g by mouth daily.   Yes Historical Provider, MD  torsemide (DEMADEX) 10 MG tablet Take 5 mg by mouth every other day.  01/07/14  Yes Historical Provider, MD   BP 129/45  Temp(Src) 98.3 F (36.8 C) (Oral)  Resp 25  Ht 5\' 2"  (1.575 m)  Wt 158 lb (71.668 kg)  BMI 28.89 kg/m2  SpO2 99%  LMP 12/19/1968 Physical Exam  Nursing note and vitals reviewed. Constitutional: She appears well-developed and well-nourished. No distress.  HENT:  Head: Normocephalic and atraumatic.  Eyes: EOM are normal. Pupils are equal, round, and reactive to light.  Neck: Normal range of motion. Neck supple.  Cardiovascular: Normal rate and regular rhythm.  Exam reveals no  friction rub.   No murmur heard. Pulmonary/Chest: Effort normal and breath sounds normal. No respiratory distress. She has no wheezes. She has no rales.  Abdominal: Soft. She exhibits no distension. There is no tenderness. There is no rebound.  Musculoskeletal: Normal range of motion. She exhibits no edema.  Neurological: She is alert. She exhibits normal muscle tone.  Listless, will follow commands occasionally. Has to be constantly redirected.  Skin: She is not diaphoretic.    ED Course  Procedures (including critical care time) Labs Review Labs Reviewed  CBC  COMPREHENSIVE METABOLIC PANEL  URINALYSIS, ROUTINE W REFLEX MICROSCOPIC  PRO B NATRIURETIC PEPTIDE  I-STAT CG4 LACTIC ACID, ED  CBG MONITORING, ED  Randolm Idol, ED    Imaging Review Dg Chest 2 View  05/10/2014   CLINICAL DATA:  Shortness of breath with generalized body aches.  EXAM: CHEST  2 VIEW  COMPARISON:  02/04/2014  FINDINGS: Lungs are adequately inflated without effusion. Subtle opacification over the left lung base as cannot exclude atelectasis or infection. Some of this density on the lateral film is likely due to the enlarged heart and overlapping vascular structures. Moderate stable cardiomegaly. Mild calcified plaque over the thoracic aorta. Remainder of the exam is unchanged.  IMPRESSION: Mild left base opacification which may represent atelectasis or infection.  Moderate stable cardiomegaly.   Electronically Signed   By: Marin Olp M.D.   On: 05/10/2014 08:05   Ct Head Wo Contrast  05/10/2014   CLINICAL DATA:  Leg pain.  Shortness of breath.  EXAM: CT HEAD WITHOUT CONTRAST  TECHNIQUE: Contiguous axial images were obtained from the base of the skull through the vertex without intravenous contrast.  COMPARISON:  MRI 01/30/2014.  Head CT 02/04/2014.  FINDINGS: Bilateral multifocal periventricular lucencies are noted consistent with prior identified periventricular ischemic foci. Similar findings noted on prior  MRI of 02/07/2014. Lucency is noted throughout the lower half right temporal lobe. This is most likely related to beam hardening artifact although a right temporal lobe infarct cannot be completely excluded. No mass. No hemorrhage. No acute bony abnormality.  IMPRESSION: 1. Diffuse lucency noted in the lower right temporal lobe most likely beam hardening artifact. A right temporal lobe infarct cannot be completely excluded. 2. Multiple periventricular punctate lucencies consistent  with previously identified multiple ischemic foci . Similar findings noted on prior MRI brain 02/07/2014.   Electronically Signed   By: Marcello Moores  Register   On: 05/10/2014 08:33   Mr Brain Wo Contrast  05/10/2014   CLINICAL DATA:  Left-sided weakness  EXAM: MRI HEAD WITHOUT CONTRAST  TECHNIQUE: Multiplanar, multiecho pulse sequences of the brain and surrounding structures were obtained without intravenous contrast.  COMPARISON:  CT 05/10/2014  FINDINGS: Image quality degraded by motion  Acute infarcts involving the right parietal lobe involving cortex and white matter. No other areas of acute infarct.  Multiple well-defined white matter hyperintensities consistent with chronic ischemia. Small focus of hemorrhage in the right occipital lobe is likely chronic. Negative for mass lesion.  Diffuse atrophy. Ventricle size within normal limits for the level of atrophy.  IMPRESSION: Two areas of acute infarct in the right parietal lobe.  Moderate chronic microvascular ischemic change with diffuse atrophy.   Electronically Signed   By: Franchot Gallo M.D.   On: 05/10/2014 12:20     EKG Interpretation None      Angiocath insertion Performed by: Osvaldo Shipper  Consent: Verbal consent obtained. Risks and benefits: risks, benefits and alternatives were discussed Time out: Immediately prior to procedure a "time out" was called to verify the correct patient, procedure, equipment, support staff and site/side marked as  required.  Preparation: Patient was prepped and draped in the usual sterile fashion.  Vein Location: R AC  Yes Ultrasound Guided  Gauge: 20  Normal blood return and flush without difficulty Patient tolerance: Patient tolerated the procedure well with no immediate complications.   CRITICAL CARE Performed by: Osvaldo Shipper   Total critical care time: 40 minutes  Critical care time was exclusive of separately billable procedures and treating other patients.  Critical care was necessary to treat or prevent imminent or life-threatening deterioration.  Critical care was time spent personally by me on the following activities: development of treatment plan with patient and/or surrogate as well as nursing, discussions with consultants, evaluation of patient's response to treatment, examination of patient, obtaining history from patient or surrogate, ordering and performing treatments and interventions, ordering and review of laboratory studies, ordering and review of radiographic studies, pulse oximetry and re-evaluation of patient's condition.   MDM   Final diagnoses:  Altered mental status, unspecified altered mental status type  Symptomatic anemia  Gastrointestinal hemorrhage, unspecified gastritis, unspecified gastrointestinal hemorrhage type    77F with altered mental status. She is a very poor historian, as is son. Had stress test yesterday showing decreased LV systolic function yesterday by Dr. Sallyanne Kuster. Only states to me, "I don't feel too good." Normally walking and talking at baseline, now is listless, will occasionally follow commands. States some sharp CP and some SOB, but can't elaborate on either. Concern with her altered mental status for infectious source, cardiac source.  Hgb low at 4.8. Hemoccult positive, not on any blood thinners. Black stools without gross blood.  GI consulted. Dr. Cruzita Lederer with internal medicine admitting to [facility]. CT head with  possible infarct. Had some watershed infarcts previously during her LGIB, likely due to hypoTN with her GI blood loss. BUN elevated, creatinine at baseline. CO2 at 14.  I spoke with Judson Roch, the PA with [facility].  Admitted to [facility] by Dr. Cruzita Lederer.  Osvaldo Shipper, MD 05/10/14 [phone]

## 2014-05-10 NOTE — Consult Note (Signed)
Referring Physician: Cruzita Lederer    Chief Complaint: Stroke  HPI:                                                                                                                                         [name] is an 80 y.o. female who lives with her husband at home.  She has history of CVA, atrial fibrillation (recent event monitors did not show A. Fib it did show some PVCs and short bursts of nonsustained ventricular tachycardia.) recent NSTEMI with outpatient evaluation by cardiology during the stress test done yesterday, combined systolic and diastolic heart failure (ejection fraction of 93-81%, grade 2 diastolic dysfunction on 0/17/5102). She has been on [facility] in past but was taken of this due to GI bleed.  Since then she has been on ASA. Over the last two days daughter and husband has noted she has become very tired, weak and SOB.  She was brought to ED due to her SOB. Hg was noted to be 4.7 and HCT of 15.8. FOBT (+).  MRI was obtained while in hospital which showed 2 right parietal infarcts. Neurology was consulted for CVA. History was obtained from family as patient is very lethargic and not vocalizing.    Date last known well: Unable to determine Time last known well: Unable to determine tPA Given: No: FOB (+) and no LSN  Past Medical History  Diagnosis Date  . Asthma   . CAD (coronary artery disease)     Pt reports MI in 2006 (no documentation).  Cardiolite in 05/2002 and 07/2006 did not reveal any reversible ischemia.  Pt follows with Dr. Rex Kras at [facility].  . CHF (congestive heart failure)     EF 25-30% with dilated LV, mild LVH, severe hypokinesis, and mod-severe reduction in RV function  . Osteoporosis   . HYPERTENSION 08/03/2006  . GASTROPARESIS, DIABETIC 08/03/2006  . HYPERLIPIDEMIA 08/03/2006  . OBSTRUCTIVE SLEEP APNEA 01/06/2008  . PERIPHERAL NEUROPATHY 08/03/2006  . GERD 08/03/2006  . LOW BACK PAIN, CHRONIC 08/03/2006  . OSTEOPOROSIS 03/21/2009  . CEREBRAL EMBOLISM, WITH  INFARCTION 07/02/2010  . Angina   . Myocardial infarction "2 or 3"  . Pneumonia 02/26/12    "a few times; probably even today"  . Shortness of breath     "all the time"  . DIABETES MELLITUS, TYPE II 11/04/1983  . Blood transfusion 08/2011  . Lower GI bleeding 08/2011 and 01/2014  . Chronic daily headache   . Migraines   . Stroke summer 2011    "made my left hip worse"  . Uterine cancer   . Nonischemic cardiomyopathy 05/2010    Left heart catheterization:2011. Nonobstructive coronary artery disease.  . Pulmonary hypertension   . Hypertension   . NSTEMI (non-ST elevated myocardial infarction) 01/2014    type II with CVA  . Acute embolic stroke 5/85/2778  . Non-ST elevation MI (NSTEMI) 02/04/2014  .  E. coli UTI 03/02/2014  . Renal failure, acute 02/04/2014  . Iron deficiency anemia secondary to blood loss (chronic) 01/06/2008    Qualifier: Diagnosis of  By: Tomasa Hosteller MD, Veronique D.   . Nonsustained ventricular tachycardia     Past Surgical History  Procedure Laterality Date  . Esophagogastroduodenoscopy  08/26/2011    Procedure: ESOPHAGOGASTRODUODENOSCOPY (EGD);  Surgeon: [name], MD;  Location: [facility] ENDOSCOPY;  Service: Endoscopy;  Laterality: N/A;  . Colonoscopy  08/28/2011    Procedure: COLONOSCOPY;  Surgeon: [name], MD;  Location: [place];  Service: Endoscopy;  Laterality: N/A;  . Vaginal hysterectomy    . Tubal ligation    . Cataract extraction w/ intraocular lens  implant, bilateral    . Toe surgery      "right big toe; operated on it to straighten it out; it was under"  . Polysomnogram  10/17/2005    AHI-7.28/hr. AHI REM-20.8/hr. Average oxygen saturation range during REM and NREM was 97%. Lowest oxygen saturation during REM sleep was 90%.  . Carotid duplex  05/28/2010    No significant extracranial carotid artery stenosis demonstrated. Vertebrals are patent w/ antegrade flow.  . Cardiac catheterization  05/24/2010    No intervention - recommed medical therapy.   . Cardiovascular stress test  08/07/2006    Moderate-severe defect seen in Basal inferior, Mid inferoseptal, Mid inferior, Mid inferolateral, and Apical inferior regions - consistent w/ infarct/scar. No scintigraphic evidence of inducible myocardial ischemia.  . Transthoracic echocardiogram  08/29/2011    EF 55-60%, moderate LVH,   . Esophagogastroduodenoscopy N/A 02/08/2014    Procedure: ESOPHAGOGASTRODUODENOSCOPY (EGD);  Surgeon: [name], MD;  Location: [facility] ENDOSCOPY;  Service: Endoscopy;  Laterality: N/A;    Family History  Problem Relation Age of Onset  . Diabetes insipidus Mother   . Hypertension Mother   . Hypertension Father   . Hypertension Sister   . Hypertension Child   . Stomach cancer Brother    Social History:  reports that she has never smoked. She quit smokeless tobacco use about 7 years ago. Her smokeless tobacco use included Snuff. She reports that she does not drink alcohol or use illicit drugs.  Allergies:  Allergies  Allergen Reactions  . Baking Soda-Fluoride [Sodium Fluoride] Nausea And Vomiting  . Magnesium Hydroxide Nausea And Vomiting    Medications:                                                                                                                           Current Facility-Administered Medications  Medication Dose Route Frequency Provider Last Rate Last Dose  . 0.9 %  sodium chloride infusion   Intravenous Continuous Caren Griffins, MD 50 mL/hr at 05/10/14 1000 50 mL/hr at 05/10/14 1000  . insulin aspart (novoLOG) injection 0-9 Units  0-9 Units Subcutaneous TID WC Costin Karlyne Greenspan, MD      . pantoprazole (PROTONIX) injection 40 mg  40 mg Intravenous Q12H Costin Karlyne Greenspan,  MD      . sodium chloride 0.9 % injection 3 mL  3 mL Intravenous Q12H Costin Karlyne Greenspan, MD         ROS:                                                                                                                                       History obtained from  unobtainable from patient due to mental status   Neurologic Examination:                                                                                                      Blood pressure 164/53, pulse 63, temperature 98 F (36.7 C), temperature source Oral, resp. rate 15, height 5\' 2"  (1.575 m), weight 71.668 kg (158 lb), last menstrual period 12/19/1968, SpO2 100.00%.  General: NAD Mental Status: Lethargic, follows only minimal verbal commands, able to state she is in the hospital and month is July but unable to give the year.  Cranial Nerves: II: Discs flat bilaterally; Visual fields --blinks to threat less on the right than the left (this is old per husband), pupils equal, round, reactive to light and accommodation III,IV, VI: ptosis not present, extra-ocular motions intact bilaterally V,VII: grimmace symmetric with slight left NL fold decrease, facial light touch sensation normal bilaterally VIII: hearing normal bilaterally IX,X: gag reflex present XI: bilateral shoulder shrug XII: midline tongue extension without atrophy or fasciculations  Motor: Right : Upper extremity   4/5    Left:     Upper extremity   4-/5  Lower extremity   5/5     Lower extremity   4-/5 --strength tested by response to noxious stimuli as patient would not follow commands.  Tone and bulk:normal tone throughout; no atrophy noted Sensory: grimace to pain throughout Deep Tendon Reflexes:  Right: Upper Extremity   Left: Upper extremity   biceps (C-5 to C-6) 2/4   biceps (C-5 to C-6) 2/4 tricep (C7) 2/4    triceps (C7) 2/4 Brachioradialis (C6) 2/4  Brachioradialis (C6) 2/4  Lower Extremity Lower Extremity  quadriceps (L-2 to L-4) 0/4   quadriceps (L-2 to L-4) 0/4 Achilles (S1) 0/4   Achilles (S1) 0/4  Plantars: Right: downgoing   Left: downgoing Cerebellar: Unable to obtain due to patient not following commands.  Gait: not tested CV: pulses palpable throughout    Lab Results: Basic Metabolic  Panel:  Recent Labs Lab 05/10/14 0735  NA 138  K 5.3  CL  103  CO2 14*  GLUCOSE 218*  BUN 56*  CREATININE 2.98*  CALCIUM 8.8    Liver Function Tests:  Recent Labs Lab 05/10/14 0735  AST 73*  ALT 78*  ALKPHOS 185*  BILITOT 0.4  PROT 6.8  ALBUMIN 2.7*   No results found for this basename: LIPASE, AMYLASE,  in the last 168 hours  Recent Labs Lab 05/10/14 0738  AMMONIA 29    CBC:  Recent Labs Lab 05/10/14 0735 05/10/14 0832  WBC 10.7* 10.5  HGB 4.9* 4.7*  HCT 16.5* 15.8*  MCV 80.9 81.9  PLT 474* 442*    Cardiac Enzymes:  Recent Labs Lab 05/10/14 1023  TROPONINI 0.57*    Lipid Panel: No results found for this basename: CHOL, TRIG, HDL, CHOLHDL, VLDL, LDLCALC,  in the last 168 hours  CBG:  Recent Labs Lab 05/10/14 0833  GLUCAP 232*    Microbiology: Results for orders placed during the hospital encounter of 02/10/14  URINE CULTURE     Status: None   Collection Time    02/14/14  1:35 PM      Result Value Ref Range Status   Specimen Description URINE, CLEAN CATCH   Final   Special Requests NONE   Final   Culture  Setup Time     Final   Value: 02/14/2014 13:43     Performed at [facility]     Final   Value: >=100,000 COLONIES/ML     Performed at [facility]   Culture     Final   Value: ESCHERICHIA COLI     Performed at [facility]   Report Status 02/16/2014 FINAL   Final   Organism ID, Bacteria ESCHERICHIA COLI   Final    Coagulation Studies:  Recent Labs  05/10/14 0924  LABPROT 17.3*  INR 1.41    Imaging: Dg Chest 2 View  05/10/2014   CLINICAL DATA:  Shortness of breath with generalized body aches.  EXAM: CHEST  2 VIEW  COMPARISON:  02/04/2014  FINDINGS: Lungs are adequately inflated without effusion. Subtle opacification over the left lung base as cannot exclude atelectasis or infection. Some of this density on the lateral film is likely due to the enlarged heart and overlapping  vascular structures. Moderate stable cardiomegaly. Mild calcified plaque over the thoracic aorta. Remainder of the exam is unchanged.  IMPRESSION: Mild left base opacification which may represent atelectasis or infection.  Moderate stable cardiomegaly.   Electronically Signed   By: Marin Olp M.D.   On: 05/10/2014 08:05   Ct Head Wo Contrast  05/10/2014   CLINICAL DATA:  Leg pain.  Shortness of breath.  EXAM: CT HEAD WITHOUT CONTRAST  TECHNIQUE: Contiguous axial images were obtained from the base of the skull through the vertex without intravenous contrast.  COMPARISON:  MRI 01/30/2014.  Head CT 02/04/2014.  FINDINGS: Bilateral multifocal periventricular lucencies are noted consistent with prior identified periventricular ischemic foci. Similar findings noted on prior MRI of 02/07/2014. Lucency is noted throughout the lower half right temporal lobe. This is most likely related to beam hardening artifact although a right temporal lobe infarct cannot be completely excluded. No mass. No hemorrhage. No acute bony abnormality.  IMPRESSION: 1. Diffuse lucency noted in the lower right temporal lobe most likely beam hardening artifact. A right temporal lobe infarct cannot be completely excluded. 2. Multiple periventricular punctate lucencies consistent with previously identified multiple ischemic foci . Similar findings noted on prior MRI brain 02/07/2014.  Electronically Signed   By: Marcello Moores  Register   On: 05/10/2014 08:33   Mr Brain Wo Contrast  05/10/2014   CLINICAL DATA:  Left-sided weakness  EXAM: MRI HEAD WITHOUT CONTRAST  TECHNIQUE: Multiplanar, multiecho pulse sequences of the brain and surrounding structures were obtained without intravenous contrast.  COMPARISON:  CT 05/10/2014  FINDINGS: Image quality degraded by motion  Acute infarcts involving the right parietal lobe involving cortex and white matter. No other areas of acute infarct.  Multiple well-defined white matter hyperintensities consistent with  chronic ischemia. Small focus of hemorrhage in the right occipital lobe is likely chronic. Negative for mass lesion.  Diffuse atrophy. Ventricle size within normal limits for the level of atrophy.  IMPRESSION: Two areas of acute infarct in the right parietal lobe.  Moderate chronic microvascular ischemic change with diffuse atrophy.   Electronically Signed   By: Franchot Gallo M.D.   On: 05/10/2014 12:20   Carotid doppler 12/28/13 showed no significant stenosis bilaterally  Echo 05/09/14 LV Wall Motion: Inferior hypokinesis. Mildly decreased overall LV function, EF 44%    Etta Quill PA-C [facility] [phone]  05/10/2014, 3:26 PM  Patient seen and examined.  Clinical course and management discussed.  Necessary edits performed.  I agree with the above.  Assessment and plan of care developed and discussed below.     Assessment: 80 y.o. female with history of Afib not on AC due to previous GI bleed. Current FOB(+).  Patient brought to ED due to lethargy and SOB.  MRI reviewed and reveals 2 right parietal acute infarct and possibly on left small acute infarct. Currently on ASA at home and ECG shows NSR. On exam patient is lethargic but does show left NL fold decrease, left arm and leg weakness.  Due to GI bleed patient not an anticoagulant candidate at this time.  Has had recent echocardiogram and carotid dopplers which do not require repeating at this time.    Stroke Risk Factors - atrial fibrillation, diabetes mellitus, hyperlipidemia, hypertension and CAD  Recommendations: 1. HgbA1c, fasting lipid panel 2. PT consult, OT consult, Speech consult 3. Prophylactic therapy-None 4. Telemetry monitoring 5. Frequent neuro checks   Alexis Goodell, MD [facility] [phone]  05/10/2014  5:49 PM

## 2014-05-10 NOTE — Consult Note (Signed)
[facility] Consult: 9:40 AM 05/10/2014  LOS: 0 days    Referring Provider: Dr Mingo Amber of ED.  Primary Care Physician:  [name], MD Primary Gastroenterologist:  Dr. Carlean Purl     Reason for Consultation:  Anemia and black stools   HPI: [name] is a 80 y.o. female.  Hx CVA, nonischemic cardiomyopathy, diastolic heart failure. EF 45% 2015, OSA, HTN, insulin-dependent type 2 DM, diabetic gastroparesis     Melena while on Coumadin, 81 ASA in 08/2011, INR 3.4. Hgb of 5.6.  EGD 08/2011 showed erosive gastritis.   Colonoscopy 08/2011: normal.  Also normal colonoscopy 2006 (for anemia) by Dr Benson Norway GI bleed felt secondary to gastric erosions. Ultimately Coumadin discontinued.   Inpt GI consult 02/06/14 for Hgb 6.2, MCV 65. Ferritin 8. FOBT +.  Was not taking PPI at home.  Was also using ASA powders intermittently.   Transfused.  At that admission diagnosed with watershed infarcts.  AST/ALT up to 126/89 at that time.   Hgb A1c of 11.5.  EGD 02/08/14: erosive gastritis.  Pt started on 81 ASA 2 weeks later. Med list at discharge from inpt rehab listed BID Protonix, also listed on meds at GI ROV 6/4.  On chronic BID iron.   In ED today c/o SOB, weakness, chest pain.  Current Med list says Omeprazole 20 mg daily, BID iron, 81 ASA.  Hgb is 4.7.  Was up to 8.5 to 8.0 in first  2 weeks of June 2015.  Stool always dark, perhaps darker but not bloody for last 4 to 5 days.  Decreased appetite several days.  No nausea or vomiting.  Pt c/o epigastric pain today.  Weakness for several days. Fell onto bed after tangling with her walker earlier this week.   No fainting or dizziness. + DOE no cough. No swelling.  No vision loss.  No blood per rectum, no nose bleeds.  Not taking NSAIDs except the 81 ASA.    Past Medical  History  Diagnosis Date  . Asthma   . CAD (coronary artery disease)     Pt reports MI in 2006 (no documentation).  Cardiolite in 05/2002 and 07/2006 did not reveal any reversible ischemia.  Pt follows with Dr. Rex Kras at [facility].  . CHF (congestive heart failure)     EF 25-30% with dilated LV, mild LVH, severe hypokinesis, and mod-severe reduction in RV function  . Osteoporosis   . HYPERTENSION 08/03/2006  . GASTROPARESIS, DIABETIC 08/03/2006  . HYPERLIPIDEMIA 08/03/2006  . OBSTRUCTIVE SLEEP APNEA 01/06/2008  . PERIPHERAL NEUROPATHY 08/03/2006  . GERD 08/03/2006  . LOW BACK PAIN, CHRONIC 08/03/2006  . OSTEOPOROSIS 03/21/2009  . CEREBRAL EMBOLISM, WITH INFARCTION 07/02/2010  . Angina   . Myocardial infarction "2 or 3"  . Pneumonia 02/26/12    "a few times; probably even today"  . Shortness of breath     "all the time"  . DIABETES MELLITUS, TYPE II 11/04/1983  . Blood transfusion 08/2011  . Lower GI bleeding 08/2011 and 01/2014  . Chronic daily headache   . Migraines   .  Stroke summer 2011    "made my left hip worse"  . Uterine cancer   . Nonischemic cardiomyopathy 05/2010    Left heart catheterization:2011. Nonobstructive coronary artery disease.  . Pulmonary hypertension   . Hypertension   . NSTEMI (non-ST elevated myocardial infarction) 01/2014    type II with CVA  . Acute embolic stroke 1/88/4166  . Non-ST elevation MI (NSTEMI) 02/04/2014  . E. coli UTI 03/02/2014  . Renal failure, acute 02/04/2014  . Iron deficiency anemia secondary to blood loss (chronic) 01/06/2008    Qualifier: Diagnosis of  By: Tomasa Hosteller MD, Veronique D.   . Nonsustained ventricular tachycardia     Past Surgical History  Procedure Laterality Date  . Esophagogastroduodenoscopy  08/26/2011    Procedure: ESOPHAGOGASTRODUODENOSCOPY (EGD);  Surgeon: [name], MD;  Location: [facility] ENDOSCOPY;  Service: Endoscopy;  Laterality: N/A;  . Colonoscopy  08/28/2011    Procedure: COLONOSCOPY;  Surgeon: [name], MD;   Location: [place];  Service: Endoscopy;  Laterality: N/A;  . Vaginal hysterectomy    . Tubal ligation    . Cataract extraction w/ intraocular lens  implant, bilateral    . Toe surgery      "right big toe; operated on it to straighten it out; it was under"  . Polysomnogram  10/17/2005    AHI-7.28/hr. AHI REM-20.8/hr. Average oxygen saturation range during REM and NREM was 97%. Lowest oxygen saturation during REM sleep was 90%.  . Carotid duplex  05/28/2010    No significant extracranial carotid artery stenosis demonstrated. Vertebrals are patent w/ antegrade flow.  . Cardiac catheterization  05/24/2010    No intervention - recommed medical therapy.  . Cardiovascular stress test  08/07/2006    Moderate-severe defect seen in Basal inferior, Mid inferoseptal, Mid inferior, Mid inferolateral, and Apical inferior regions - consistent w/ infarct/scar. No scintigraphic evidence of inducible myocardial ischemia.  . Transthoracic echocardiogram  08/29/2011    EF 55-60%, moderate LVH,   . Esophagogastroduodenoscopy N/A 02/08/2014    Procedure: ESOPHAGOGASTRODUODENOSCOPY (EGD);  Surgeon: [name], MD;  Location: [facility] ENDOSCOPY;  Service: Endoscopy;  Laterality: N/A;    Prior to Admission medications   Medication Sig Start Date End Date Taking? Authorizing Provider  acetaminophen (TYLENOL) 500 MG tablet Take 500 mg by mouth every 6 (six) hours as needed for moderate pain or headache.   Yes Historical Provider, MD  albuterol (PROAIR HFA) 108 (90 BASE) MCG/ACT inhaler Inhale 2 puffs into the lungs every 6 (six) hours as needed for wheezing. 02/17/14  Yes Daniel J Angiulli, PA-C  amLODipine (NORVASC) 10 MG tablet Take 1 tablet (10 mg total) by mouth daily. 02/17/14  Yes Daniel J Angiulli, PA-C  aspirin 81 MG tablet Take 81 mg by mouth daily.   Yes Historical Provider, MD  atorvastatin (LIPITOR) 40 MG tablet Take 1 tablet (40 mg total) by mouth daily at 6 PM. 04/06/14  Yes Brittainy Rosita Fire, PA-C  ferrous  sulfate 325 (65 FE) MG tablet Take 1 tablet (325 mg total) by mouth 2 (two) times daily with a meal. 03/16/14  Yes [name], MD  insulin glargine (LANTUS) 100 UNIT/ML injection Inject 0.1 mLs (10 Units total) into the skin at bedtime. 02/17/14  Yes Daniel J Angiulli, PA-C  isosorbide mononitrate (IMDUR) 60 MG 24 hr tablet Take 1 tablet (60 mg total) by mouth daily. 02/17/14  Yes Daniel J Angiulli, PA-C  KLOR-CON 10 10 MEQ tablet Take 1 tablet by mouth every other day. 01/07/14  Yes Historical Provider, MD  lisinopril (PRINIVIL,ZESTRIL) 5 MG tablet Take 5 mg by mouth daily.   Yes Historical Provider, MD  loratadine (ALLERGY RELIEF) 10 MG tablet Take 10 mg by mouth daily.   Yes Historical Provider, MD  magnesium oxide (MAG-OX) 400 MG tablet Take 1 tablet (400 mg total) by mouth 2 (two) times daily. Take 400 mg one tablet two times a day for five days then cut back to once a day after those 5 days 03/14/14  Yes Cecilie Kicks, NP  metoprolol succinate (TOPROL-XL) 25 MG 24 hr tablet Take 1 tablet (25 mg total) by mouth daily. 02/17/14  Yes Daniel J Angiulli, PA-C  Multiple Vitamins-Minerals (ALIVE WOMENS 50+ PO) Take 1 tablet by mouth daily.   Yes Historical Provider, MD  omeprazole (PRILOSEC) 20 MG capsule Take 1 capsule (20 mg total) by mouth daily before breakfast. 03/16/14  Yes [name], MD  polyethylene glycol [facility] / GLYCOLAX) packet Take 17 g by mouth daily.   Yes Historical Provider, MD  torsemide (DEMADEX) 10 MG tablet Take 5 mg by mouth every other day.  01/07/14  Yes Historical Provider, MD    Scheduled Meds:  Infusions:  PRN Meds:      Allergies as of 05/10/2014 - Review Complete 05/10/2014  Allergen Reaction Noted  . Baking soda-fluoride [sodium fluoride] Nausea And Vomiting 03/28/2011  . Magnesium hydroxide Nausea And Vomiting 08/28/2011    Family History  Problem Relation Age of Onset  . Diabetes insipidus Mother   . Hypertension Mother   . Hypertension Father   .  Hypertension Sister   . Hypertension Child   . Stomach cancer Brother     History   Social History  . Marital Status: Widowed    Spouse Name: N/A    Number of Children: 7  . Years of Education: N/A   Occupational History  . Disabled    Social History Main Topics  . Smoking status: Never Smoker   . Smokeless tobacco: Former Systems developer    Types: Snuff    Quit date: 10/13/2006     Comment: 02/26/12 "stopped snuff 4-6 years ago"  . Alcohol Use: No     Comment: "stopped drinking alcohol ~ 1980's"  . Drug Use: No  . Sexual Activity: No   Other Topics Concern  . Not on file   Social History Narrative   ** Merged History Encounter **       Lives with daughter    REVIEW OF SYSTEMS: Per HPI.  12 system reviw completed.    PHYSICAL EXAM: Vital signs in last 24 hours: Filed Vitals:   05/10/14 0902  BP:   Pulse:   Temp: 98.7 F (37.1 C)  Resp:    Wt Readings from Last 3 Encounters:  05/10/14 71.668 kg (158 lb)  05/09/14 69.854 kg (154 lb)  04/25/14 69.854 kg (154 lb)    General: pale, looks ill and chronically unwell Head:  No swelling or asymmetry  Eyes:  No icterus, + conj pallor Ears:  Hearing intact  Nose:  No congestion or discharge.  No bleeding Mouth:  Poor dentition, moist MM, no lesions or blood Neck:  No mass, no JVD Lungs:  Clear , no dyspnea, no cough Heart: RRRwith 2/6 systolic murmer Abdomen:  Soft, tender at epigatrum.  No mass or HSM.Marland Kitchen   Rectal: per ED MD and RN: greenish black.  FOBT +.     Musc/Skeltl: no joint swelling or pain Extremities:  No pedal edema.  Feet warm.   Neurologic:  Oriented to place and time, self.  Aware of why she is here Skin:  No telangectasia or sores.  No rash Tattoos:  none Nodes:  No cervical adenopathy.   Psych:  Cooperative, relaxed, lethargic  But easily aroused.  Halting but clear speech.    LAB RESULTS:  Recent Labs  05/10/14 0735 05/10/14 0832  WBC 10.7* 10.5  HGB 4.9* 4.7*  HCT 16.5* 15.8*  PLT 474*  442*  MCV    81  BMET Lab Results  Component Value Date   NA 138 05/10/2014   NA 142 03/24/2014   NA 140 03/13/2014   K 5.3 05/10/2014   K 4.1 03/24/2014   K 3.7 03/13/2014   CL 103 05/10/2014   CL 107 03/24/2014   CL 107 03/13/2014   CO2 14* 05/10/2014   CO2 18* 03/24/2014   CO2 24 03/13/2014   GLUCOSE 218* 05/10/2014   GLUCOSE 45* 03/24/2014   GLUCOSE 215* 03/13/2014   BUN 56* 05/10/2014   BUN 38* 03/24/2014   BUN 45* 03/13/2014   CREATININE 2.98* 05/10/2014   CREATININE 2.72* 03/24/2014   CREATININE 2.75* 03/13/2014   CALCIUM 8.8 05/10/2014   CALCIUM 9.2 03/24/2014   CALCIUM 9.0 03/13/2014   LFT  Recent Labs  05/10/14 0735  PROT 6.8  ALBUMIN 2.7*  AST 73*  ALT 78*  ALKPHOS 185*  BILITOT 0.4    RADIOLOGY STUDIES: Dg Chest 2 View  05/10/2014   CLINICAL DATA:  Shortness of breath with generalized body aches.  EXAM: CHEST  2 VIEW  COMPARISON:  02/04/2014  FINDINGS: Lungs are adequately inflated without effusion. Subtle opacification over the left lung base as cannot exclude atelectasis or infection. Some of this density on the lateral film is likely due to the enlarged heart and overlapping vascular structures. Moderate stable cardiomegaly. Mild calcified plaque over the thoracic aorta. Remainder of the exam is unchanged.  IMPRESSION: Mild left base opacification which may represent atelectasis or infection.  Moderate stable cardiomegaly.   Electronically Signed   By: Marin Olp M.D.   On: 05/10/2014 08:05   Ct Head Wo Contrast  05/10/2014   CLINICAL DATA:  Leg pain.  Shortness of breath.  EXAM: CT HEAD WITHOUT CONTRAST  TECHNIQUE: Contiguous axial images were obtained from the base of the skull through the vertex without intravenous contrast.  COMPARISON:  MRI 01/30/2014.  Head CT 02/04/2014.  FINDINGS: Bilateral multifocal periventricular lucencies are noted consistent with prior identified periventricular ischemic foci. Similar findings noted on prior MRI of 02/07/2014. Lucency is noted  throughout the lower half right temporal lobe. This is most likely related to beam hardening artifact although a right temporal lobe infarct cannot be completely excluded. No mass. No hemorrhage. No acute bony abnormality.  IMPRESSION: 1. Diffuse lucency noted in the lower right temporal lobe most likely beam hardening artifact. A right temporal lobe infarct cannot be completely excluded. 2. Multiple periventricular punctate lucencies consistent with previously identified multiple ischemic foci . Similar findings noted on prior MRI brain 02/07/2014.   Electronically Signed   By: Marcello Moores  Register   On: 05/10/2014 08:33    ENDOSCOPIC STUDIES: See HPI  IMPRESSION:   *  Anemia. Recurrent. Normocytic.  On BID po Iron.  *  FOBT +.  Recurrent.   *  Erosive, ulcerative gastritis on previous EGDs.  On daily Omeprazole at home, also low dose ASA *  CVA hx.  On ASA     PLAN:     *  Per Dr Carlean Purl.  ?  Repeat EGD tomorow? *  BID Protonix as doing *  Clears.    Azucena Freed  05/10/2014, 9:40 AM Pager: [phone]  [facility] Attending  I have also seen and assessed the patient and agree with the above note. Cause of melena and anemia not clear. At this point I think she needs to repeat a colonoscopy - Fri 7/31 probably. If negative - capsule endo small bowel Some of her anemia is from chronic disease Will check back tomorrow. Situation complicated by stroke, presumed embolic.  The risks and benefits as well as alternatives of endoscopic procedure(s) have been discussed and reviewed. All questions answered. The patient (and daughter) agree to proceed.  [name], MD, [facility] [phone] (pager) 05/10/2014 5:57 PM

## 2014-05-10 NOTE — ED Notes (Signed)
Pt is from home. Pt c/o generalized body aches, mostly in her extremities. Pt states that the pain started yesterday in her legs and has gotten worse. Pt had a stress test yesterday. Family reports that the pt has had some increased shortness of breath. Per daughter, pt's mentation is her baseline. CBG 326.

## 2014-05-10 NOTE — H&P (Signed)
History and Physical    [name] KZS:[id number] DOB: 05-30-1933 DOA: 05/10/2014  Referring physician: Dr. Mingo Amber PCP: [name], MD  Specialists: GI, Dr. Carlean Purl  Chief Complaint: lethargy  HPI: [name] is a 80 y.o. female has a past medical history significant for recent CVA, atrial fibrillation, recent NSTEMI with outpatient evaluation by cardiology during the stress test done yesterday, combined systolic and diastolic heart failure (ejection fraction of 73-22%, grade 2 diastolic dysfunction on 0/25/4270), is being brought to the emergency room by her son shortness of breath and weakness. The patient is somewhat lethargic in the emergency room, but able to wake up and answer some questions. Her son tells me that she has been feeling very weak and progressively sleeping more and more lethargic over the last 24 hours, and he was barely able to wake her up this morning, and he decided to bring her to the emergency room. She underwent a stress test per cardiology yesterday, and apparently she did well without and walked home without any problems, and her current symptoms have started after that. In the emergency room, patient has normal vital signs, she is not tachycardic, she is not hypotensive, a CBC showed a hemoglobin of 4.7 appropriate hemoglobin of 8.0 on 6/12. She has a history of melena while anticoagulated and is followed by Dr. Carlean Purl as an outpatient. Last EGD was 14/29/2015 which showed erosive gastritis. She was anticoagulated in the past because of her atrial fibrillation and CVA but that was stopped due to GI bleed and she currently is on 81 mg of aspirin. Per her some, she has been having intermittent chest pain, no fevers or chills, no nausea vomiting or diarrhea.  Review of Systems: Unable to obtain review of system due to confusion  Past Medical History  Diagnosis Date  . Asthma   . CAD (coronary artery disease)     Pt reports MI in 2006 (no documentation).   Cardiolite in 05/2002 and 07/2006 did not reveal any reversible ischemia.  Pt follows with Dr. Rex Kras at [facility].  . CHF (congestive heart failure)     EF 25-30% with dilated LV, mild LVH, severe hypokinesis, and mod-severe reduction in RV function  . Osteoporosis   . HYPERTENSION 08/03/2006  . GASTROPARESIS, DIABETIC 08/03/2006  . HYPERLIPIDEMIA 08/03/2006  . OBSTRUCTIVE SLEEP APNEA 01/06/2008  . PERIPHERAL NEUROPATHY 08/03/2006  . GERD 08/03/2006  . LOW BACK PAIN, CHRONIC 08/03/2006  . OSTEOPOROSIS 03/21/2009  . CEREBRAL EMBOLISM, WITH INFARCTION 07/02/2010  . Angina   . Myocardial infarction "2 or 3"  . Pneumonia 02/26/12    "a few times; probably even today"  . Shortness of breath     "all the time"  . DIABETES MELLITUS, TYPE II 11/04/1983  . Blood transfusion 08/2011  . Lower GI bleeding 08/2011 and 01/2014  . Chronic daily headache   . Migraines   . Stroke summer 2011    "made my left hip worse"  . Uterine cancer   . Nonischemic cardiomyopathy 05/2010    Left heart catheterization:2011. Nonobstructive coronary artery disease.  . Pulmonary hypertension   . Hypertension   . NSTEMI (non-ST elevated myocardial infarction) 01/2014    type II with CVA  . Acute embolic stroke 04/05/7627  . Non-ST elevation MI (NSTEMI) 02/04/2014  . E. coli UTI 03/02/2014  . Renal failure, acute 02/04/2014  . Iron deficiency anemia secondary to blood loss (chronic) 01/06/2008    Qualifier: Diagnosis of  By: Tomasa Hosteller MD, Edmon Crape.   Marland Kitchen  Nonsustained ventricular tachycardia    Past Surgical History  Procedure Laterality Date  . Esophagogastroduodenoscopy  08/26/2011    Procedure: ESOPHAGOGASTRODUODENOSCOPY (EGD);  Surgeon: [name], MD;  Location: [facility] ENDOSCOPY;  Service: Endoscopy;  Laterality: N/A;  . Colonoscopy  08/28/2011    Procedure: COLONOSCOPY;  Surgeon: [name], MD;  Location: [place];  Service: Endoscopy;  Laterality: N/A;  . Vaginal hysterectomy    . Tubal ligation    .  Cataract extraction w/ intraocular lens  implant, bilateral    . Toe surgery      "right big toe; operated on it to straighten it out; it was under"  . Polysomnogram  10/17/2005    AHI-7.28/hr. AHI REM-20.8/hr. Average oxygen saturation range during REM and NREM was 97%. Lowest oxygen saturation during REM sleep was 90%.  . Carotid duplex  05/28/2010    No significant extracranial carotid artery stenosis demonstrated. Vertebrals are patent w/ antegrade flow.  . Cardiac catheterization  05/24/2010    No intervention - recommed medical therapy.  . Cardiovascular stress test  08/07/2006    Moderate-severe defect seen in Basal inferior, Mid inferoseptal, Mid inferior, Mid inferolateral, and Apical inferior regions - consistent w/ infarct/scar. No scintigraphic evidence of inducible myocardial ischemia.  . Transthoracic echocardiogram  08/29/2011    EF 55-60%, moderate LVH,   . Esophagogastroduodenoscopy N/A 02/08/2014    Procedure: ESOPHAGOGASTRODUODENOSCOPY (EGD);  Surgeon: [name], MD;  Location: [facility] ENDOSCOPY;  Service: Endoscopy;  Laterality: N/A;   Social History:  reports that she has never smoked. She quit smokeless tobacco use about 7 years ago. Her smokeless tobacco use included Snuff. She reports that she does not drink alcohol or use illicit drugs.  Allergies  Allergen Reactions  . Baking Soda-Fluoride [Sodium Fluoride] Nausea And Vomiting  . Magnesium Hydroxide Nausea And Vomiting    Family History  Problem Relation Age of Onset  . Diabetes insipidus Mother   . Hypertension Mother   . Hypertension Father   . Hypertension Sister   . Hypertension Child   . Stomach cancer Brother    Prior to Admission medications   Medication Sig Start Date End Date Taking? Authorizing Provider  acetaminophen (TYLENOL) 500 MG tablet Take 500 mg by mouth every 6 (six) hours as needed for moderate pain or headache.   Yes Historical Provider, MD  albuterol (PROAIR HFA) 108 (90 BASE) MCG/ACT  inhaler Inhale 2 puffs into the lungs every 6 (six) hours as needed for wheezing. 02/17/14  Yes Daniel J Angiulli, PA-C  amLODipine (NORVASC) 10 MG tablet Take 1 tablet (10 mg total) by mouth daily. 02/17/14  Yes Daniel J Angiulli, PA-C  aspirin 81 MG tablet Take 81 mg by mouth daily.   Yes Historical Provider, MD  atorvastatin (LIPITOR) 40 MG tablet Take 1 tablet (40 mg total) by mouth daily at 6 PM. 04/06/14  Yes Brittainy Rosita Fire, PA-C  ferrous sulfate 325 (65 FE) MG tablet Take 1 tablet (325 mg total) by mouth 2 (two) times daily with a meal. 03/16/14  Yes [name], MD  insulin glargine (LANTUS) 100 UNIT/ML injection Inject 0.1 mLs (10 Units total) into the skin at bedtime. 02/17/14  Yes Daniel J Angiulli, PA-C  isosorbide mononitrate (IMDUR) 60 MG 24 hr tablet Take 1 tablet (60 mg total) by mouth daily. 02/17/14  Yes Daniel J Angiulli, PA-C  KLOR-CON 10 10 MEQ tablet Take 1 tablet by mouth every other day. 01/07/14  Yes Historical Provider, MD  lisinopril (  PRINIVIL,ZESTRIL) 5 MG tablet Take 5 mg by mouth daily.   Yes Historical Provider, MD  loratadine (ALLERGY RELIEF) 10 MG tablet Take 10 mg by mouth daily.   Yes Historical Provider, MD  magnesium oxide (MAG-OX) 400 MG tablet Take 1 tablet (400 mg total) by mouth 2 (two) times daily. Take 400 mg one tablet two times a day for five days then cut back to once a day after those 5 days 03/14/14  Yes Cecilie Kicks, NP  metoprolol succinate (TOPROL-XL) 25 MG 24 hr tablet Take 1 tablet (25 mg total) by mouth daily. 02/17/14  Yes Daniel J Angiulli, PA-C  Multiple Vitamins-Minerals (ALIVE WOMENS 50+ PO) Take 1 tablet by mouth daily.   Yes Historical Provider, MD  omeprazole (PRILOSEC) 20 MG capsule Take 1 capsule (20 mg total) by mouth daily before breakfast. 03/16/14  Yes [name], MD  polyethylene glycol [facility] / GLYCOLAX) packet Take 17 g by mouth daily.   Yes Historical Provider, MD  torsemide (DEMADEX) 10 MG tablet Take 5 mg by mouth every other day.   01/07/14  Yes Historical Provider, MD   Physical Exam: Filed Vitals:   05/10/14 1215 05/10/14 1303 05/10/14 1315 05/10/14 1345  BP: 151/51 153/48 154/49 158/48  Pulse: 65 65 64 65  Temp: 98.7 F (37.1 C)  98 F (36.7 C)   TempSrc: Oral  Oral   Resp: 18 18 19 19   Height:      Weight:      SpO2: 100% 100% 100% 100%     General:  No apparent distress, confused, able to answer some questions  Eyes: no scleral icterus  ENT: moist oropharynx  Neck: supple, no JVD  Cardiovascular: regular rate without MRG; 2+ peripheral pulses  Respiratory: CTA biL, good air movement without wheezing, rhonchi or crackled  Abdomen: soft, non tender to palpation, positive bowel sounds  Skin: no rashes  Musculoskeletal: no peripheral edema  Psychiatric: normal mood and affect  Neurologic: strength 4/5 on left, 5/5 on right, no other focal findings  Labs on Admission:  Basic Metabolic Panel:  Recent Labs Lab 05/10/14 0735  NA 138  K 5.3  CL 103  CO2 14*  GLUCOSE 218*  BUN 56*  CREATININE 2.98*  CALCIUM 8.8   Liver Function Tests:  Recent Labs Lab 05/10/14 0735  AST 73*  ALT 78*  ALKPHOS 185*  BILITOT 0.4  PROT 6.8  ALBUMIN 2.7*    Recent Labs Lab 05/10/14 0738  AMMONIA 29   CBC:  Recent Labs Lab 05/10/14 0735 05/10/14 0832  WBC 10.7* 10.5  HGB 4.9* 4.7*  HCT 16.5* 15.8*  MCV 80.9 81.9  PLT 474* 442*   Cardiac Enzymes:  Recent Labs Lab 05/10/14 1023  TROPONINI 0.57*    BNP (last 3 results)  Recent Labs  12/03/13 1808 12/05/13 0448 05/10/14 0736  PROBNP 7767.0* 6023.0* 14464.0*   CBG:  Recent Labs Lab 05/10/14 0833  GLUCAP 232*    Radiological Exams on Admission: Dg Chest 2 View  05/10/2014   CLINICAL DATA:  Shortness of breath with generalized body aches.  EXAM: CHEST  2 VIEW  COMPARISON:  02/04/2014  FINDINGS: Lungs are adequately inflated without effusion. Subtle opacification over the left lung base as cannot exclude atelectasis or  infection. Some of this density on the lateral film is likely due to the enlarged heart and overlapping vascular structures. Moderate stable cardiomegaly. Mild calcified plaque over the thoracic aorta. Remainder of the exam is unchanged.  IMPRESSION:  Mild left base opacification which may represent atelectasis or infection.  Moderate stable cardiomegaly.   Electronically Signed   By: Marin Olp M.D.   On: 05/10/2014 08:05   Ct Head Wo Contrast  05/10/2014   CLINICAL DATA:  Leg pain.  Shortness of breath.  EXAM: CT HEAD WITHOUT CONTRAST  TECHNIQUE: Contiguous axial images were obtained from the base of the skull through the vertex without intravenous contrast.  COMPARISON:  MRI 01/30/2014.  Head CT 02/04/2014.  FINDINGS: Bilateral multifocal periventricular lucencies are noted consistent with prior identified periventricular ischemic foci. Similar findings noted on prior MRI of 02/07/2014. Lucency is noted throughout the lower half right temporal lobe. This is most likely related to beam hardening artifact although a right temporal lobe infarct cannot be completely excluded. No mass. No hemorrhage. No acute bony abnormality.  IMPRESSION: 1. Diffuse lucency noted in the lower right temporal lobe most likely beam hardening artifact. A right temporal lobe infarct cannot be completely excluded. 2. Multiple periventricular punctate lucencies consistent with previously identified multiple ischemic foci . Similar findings noted on prior MRI brain 02/07/2014.   Electronically Signed   By: Marcello Moores  Register   On: 05/10/2014 08:33   Mr Brain Wo Contrast  05/10/2014   CLINICAL DATA:  Left-sided weakness  EXAM: MRI HEAD WITHOUT CONTRAST  TECHNIQUE: Multiplanar, multiecho pulse sequences of the brain and surrounding structures were obtained without intravenous contrast.  COMPARISON:  CT 05/10/2014  FINDINGS: Image quality degraded by motion  Acute infarcts involving the right parietal lobe involving cortex and white  matter. No other areas of acute infarct.  Multiple well-defined white matter hyperintensities consistent with chronic ischemia. Small focus of hemorrhage in the right occipital lobe is likely chronic. Negative for mass lesion.  Diffuse atrophy. Ventricle size within normal limits for the level of atrophy.  IMPRESSION: Two areas of acute infarct in the right parietal lobe.  Moderate chronic microvascular ischemic change with diffuse atrophy.   Electronically Signed   By: Franchot Gallo M.D.   On: 05/10/2014 12:20   EKG: Independently reviewed. Sinus rhythm  Assessment/Plan Active Problems:   DM (diabetes mellitus), type 2, uncontrolled   HYPERLIPIDEMIA   Iron deficiency anemia secondary to blood loss (chronic)   HYPERTENSION   CAD (coronary artery disease)   Memory difficulties   Chronic diastolic CHF EF 29%   NSTEMI (non-ST elevated myocardial infarction)   Atrial fibrillation   Anemia   GI bleed   GI bleed - will be transfused 2 units packed red blood cells, ordered by EDP - GI consulted, appreciate assistance - Hold her aspirin - Gentle IV fluids - She is normotensive, is not tachycardic (she is on beta blockers) Acute encephalopathy - likely due to #1, CKD stage III-IV - creatinine seems to be at baseline currently. Continue to monitor. CAD - chest pain, EKG without any ST segment changes, positive troponin currently likely due to ongoing GI bleed. We'll trend troponins. CVA - patient with left-sided weakness in the emergency room, ordered a brain MRI which showed 2 areas of acute infarct in the right parietal lobe. This is unfortunate and she is not able to receive aspirin due to ongoing GI bleed. Will consult neurology. Recently had a 2-D echo as well as carotid Dopplers with a previous CVA 3 months ago. We'll not repeat for now. NSTEMI -  with troponin elevation, we'll continue to monitor, this seems to be in the setting of profound anemia rather than true ACS.  - Patient  had a  stress test yesterday and was read as a 'low risk stress nuclear study with an inferior wall scar and mildly depressed LV systolic function". We'll consider cardiology consult if her troponin continues to rise significantly. DM - we'll resume Lantus at a lower dose than home, sliding scale insulin.   Diet: NPO  Fluids: NS 50 cc/h for 1 day DVT Prophylaxis: SCD  Code Status: DNR  Family Communication: son at bedside Disposition Plan: inpatient  Time spent: 45  This note has been created with Surveyor, quantity. Any transcriptional errors are unintentional.   Costin M. Cruzita Lederer, MD [facility] Pager [phone]  If 7PM-7AM, please contact night-coverage [email or web address] Password [email or web address] 05/10/2014, 2:22 PM

## 2014-05-10 NOTE — ED Notes (Addendum)
Patient returned from [place]. Placed back on monitor by EMT AMy. Unable to obtain IV. MD Mingo Amber at the bedside placing [place] IV.

## 2014-05-10 NOTE — ED Notes (Signed)
Hospitalist at the bedside 

## 2014-05-10 NOTE — ED Notes (Signed)
CBG 232. 

## 2014-05-10 NOTE — ED Notes (Signed)
Spoke with MD Mingo Amber. Stated, "I will start the second IV when patient returns and start the blood slowly when she returns from MRI."

## 2014-05-11 DIAGNOSIS — I214 Non-ST elevation (NSTEMI) myocardial infarction: Secondary | ICD-10-CM

## 2014-05-11 DIAGNOSIS — I5042 Chronic combined systolic (congestive) and diastolic (congestive) heart failure: Secondary | ICD-10-CM

## 2014-05-11 DIAGNOSIS — I509 Heart failure, unspecified: Secondary | ICD-10-CM

## 2014-05-11 DIAGNOSIS — D649 Anemia, unspecified: Secondary | ICD-10-CM

## 2014-05-11 DIAGNOSIS — IMO0001 Reserved for inherently not codable concepts without codable children: Secondary | ICD-10-CM

## 2014-05-11 DIAGNOSIS — I639 Cerebral infarction, unspecified: Secondary | ICD-10-CM

## 2014-05-11 DIAGNOSIS — I6350 Cerebral infarction due to unspecified occlusion or stenosis of unspecified cerebral artery: Secondary | ICD-10-CM

## 2014-05-11 DIAGNOSIS — R4182 Altered mental status, unspecified: Secondary | ICD-10-CM

## 2014-05-11 DIAGNOSIS — E1165 Type 2 diabetes mellitus with hyperglycemia: Secondary | ICD-10-CM

## 2014-05-11 LAB — GLUCOSE, CAPILLARY
GLUCOSE-CAPILLARY: 246 mg/dL — AB (ref 70–99)
GLUCOSE-CAPILLARY: 278 mg/dL — AB (ref 70–99)
Glucose-Capillary: 255 mg/dL — ABNORMAL HIGH (ref 70–99)
Glucose-Capillary: 193 mg/dL — ABNORMAL HIGH (ref 70–99)
Glucose-Capillary: 317 mg/dL — ABNORMAL HIGH (ref 70–99)
Glucose-Capillary: 265 mg/dL — ABNORMAL HIGH (ref 70–99)
Glucose-Capillary: 206 mg/dL — ABNORMAL HIGH (ref 70–99)

## 2014-05-11 LAB — COMPREHENSIVE METABOLIC PANEL
ALBUMIN: 2.2 g/dL — AB (ref 3.5–5.2)
ALK PHOS: 140 U/L — AB (ref 39–117)
ALT: 89 U/L — ABNORMAL HIGH (ref 0–35)
AST: 76 U/L — AB (ref 0–37)
Anion gap: 13 (ref 5–15)
BUN: 56 mg/dL — ABNORMAL HIGH (ref 6–23)
CO2: 16 meq/L — AB (ref 19–32)
Calcium: 8.4 mg/dL (ref 8.4–10.5)
Chloride: 110 mEq/L (ref 96–112)
Creatinine, Ser: 2.63 mg/dL — ABNORMAL HIGH (ref 0.50–1.10)
GFR calc Af Amer: 19 mL/min — ABNORMAL LOW (ref >90)
GFR, EST NON AFRICAN AMERICAN: 16 mL/min — AB (ref >90)
Glucose, Bld: 236 mg/dL — ABNORMAL HIGH (ref 70–99)
POTASSIUM: 4.6 meq/L (ref 3.7–5.3)
Sodium: 139 mEq/L (ref 137–147)
Total Bilirubin: 0.8 mg/dL (ref 0.3–1.2)
Total Protein: 5.7 g/dL — ABNORMAL LOW (ref 6.0–8.3)

## 2014-05-11 LAB — CBC
HCT: 20.6 % — ABNORMAL LOW (ref 36.0–46.0)
HEMATOCRIT: 22.9 % — AB (ref 36.0–46.0)
HEMOGLOBIN: 6.6 g/dL — AB (ref 12.0–15.0)
Hemoglobin: 7.5 g/dL — ABNORMAL LOW (ref 12.0–15.0)
MCH: 26.2 pg (ref 26.0–34.0)
MCH: 26.1 pg (ref 26.0–34.0)
MCHC: 32 g/dL (ref 30.0–36.0)
MCHC: 32.8 g/dL (ref 30.0–36.0)
MCV: 81.7 fL (ref 78.0–100.0)
MCV: 79.8 fL (ref 78.0–100.0)
PLATELETS: 318 10*3/uL (ref 150–400)
PLATELETS: 368 10*3/uL (ref 150–400)
RBC: 2.52 MIL/uL — AB (ref 3.87–5.11)
RBC: 2.87 MIL/uL — AB (ref 3.87–5.11)
RDW: 17.4 % — ABNORMAL HIGH (ref 11.5–15.5)
RDW: 16.8 % — ABNORMAL HIGH (ref 11.5–15.5)
WBC: 9.6 10*3/uL (ref 4.0–10.5)
WBC: 11 10*3/uL — ABNORMAL HIGH (ref 4.0–10.5)

## 2014-05-11 LAB — PREPARE RBC (CROSSMATCH)

## 2014-05-11 LAB — HEMOGLOBIN A1C
Hgb A1c MFr Bld: 7 % — ABNORMAL HIGH (ref <5.7)
MEAN PLASMA GLUCOSE: 154 mg/dL — AB (ref <117)

## 2014-05-11 LAB — MAGNESIUM: Magnesium: 2.1 mg/dL (ref 1.5–2.5)

## 2014-05-11 LAB — HEMOGLOBIN AND HEMATOCRIT, BLOOD
HEMATOCRIT: 25.2 % — AB (ref 36.0–46.0)
HEMOGLOBIN: 8 g/dL — AB (ref 12.0–15.0)

## 2014-05-11 MED ORDER — INSULIN ASPART 100 UNIT/ML ~~LOC~~ SOLN
0.0000 [IU] | SUBCUTANEOUS | Status: DC
Start: 2014-05-11 — End: 2014-05-11

## 2014-05-11 MED ORDER — PEG 3350-KCL-NABCB-NACL-NASULF 236 G PO SOLR
1000.0000 mL | Freq: Once | ORAL | Status: AC
  Administered 2014-05-11: 1000 mL via ORAL
  Filled 2014-05-11: qty 4000

## 2014-05-11 MED ORDER — FUROSEMIDE 10 MG/ML IJ SOLN
40.0000 mg | Freq: Once | INTRAMUSCULAR | Status: AC
  Administered 2014-05-11: 40 mg via INTRAVENOUS
  Filled 2014-05-11: qty 4

## 2014-05-11 MED ORDER — INSULIN GLARGINE 100 UNIT/ML ~~LOC~~ SOLN
8.0000 [IU] | Freq: Every day | SUBCUTANEOUS | Status: DC
  Administered 2014-05-11 – 2014-05-14 (×4): 8 [IU] via SUBCUTANEOUS
  Filled 2014-05-11 (×6): qty 0.08

## 2014-05-11 MED ORDER — SODIUM CHLORIDE 0.9 % IV SOLN
INTRAVENOUS | Status: DC
  Administered 2014-05-11: via INTRAVENOUS

## 2014-05-11 MED ORDER — PEG-KCL-NACL-NASULF-NA ASC-C 100 G PO SOLR: 1.0000 | Freq: Once | ORAL | Status: DC

## 2014-05-11 MED ORDER — PEG 3350-KCL-NABCB-NACL-NASULF 236 G PO SOLR
1000.0000 mL | Freq: Once | ORAL | Status: AC
  Administered 2014-05-12: 1000 mL via ORAL

## 2014-05-11 MED ORDER — INSULIN ASPART 100 UNIT/ML ~~LOC~~ SOLN
0.0000 [IU] | SUBCUTANEOUS | Status: DC
  Administered 2014-05-11: 8 [IU] via SUBCUTANEOUS
  Administered 2014-05-11: 5 [IU] via SUBCUTANEOUS
  Administered 2014-05-11: 3 [IU] via SUBCUTANEOUS
  Administered 2014-05-11: 8 [IU] via SUBCUTANEOUS
  Administered 2014-05-11: 2 [IU] via SUBCUTANEOUS
  Administered 2014-05-11: 11 [IU] via SUBCUTANEOUS
  Administered 2014-05-12: 2 [IU] via SUBCUTANEOUS
  Administered 2014-05-12: 5 [IU] via SUBCUTANEOUS
  Administered 2014-05-13: 2 [IU] via SUBCUTANEOUS
  Administered 2014-05-13: 1 [IU] via SUBCUTANEOUS
  Administered 2014-05-14: 2 [IU] via SUBCUTANEOUS
  Administered 2014-05-14 (×2): 3 [IU] via SUBCUTANEOUS

## 2014-05-11 NOTE — Evaluation (Signed)
Clinical/Bedside Swallow Evaluation Patient Details  Name: [name] MRN: [id number] Date of Birth: 05-07-1933  Today's Date: 05/11/2014 Time: 1032-1051 SLP Time Calculation (min): 19 min  Past Medical History:  Past Medical History  Diagnosis Date  . Asthma   . CAD (coronary artery disease)     Pt reports MI in 2006 (no documentation).  Cardiolite in 05/2002 and 07/2006 did not reveal any reversible ischemia.  Pt follows with Dr. Rex Kras at [facility].  . CHF (congestive heart failure)     EF 25-30% with dilated LV, mild LVH, severe hypokinesis, and mod-severe reduction in RV function  . Osteoporosis   . HYPERTENSION 08/03/2006  . GASTROPARESIS, DIABETIC 08/03/2006  . HYPERLIPIDEMIA 08/03/2006  . OBSTRUCTIVE SLEEP APNEA 01/06/2008  . PERIPHERAL NEUROPATHY 08/03/2006  . GERD 08/03/2006  . LOW BACK PAIN, CHRONIC 08/03/2006  . OSTEOPOROSIS 03/21/2009  . CEREBRAL EMBOLISM, WITH INFARCTION 07/02/2010  . Angina   . Myocardial infarction "2 or 3"  . Pneumonia 02/26/12    "a few times; probably even today"  . Shortness of breath     "all the time"  . DIABETES MELLITUS, TYPE II 11/04/1983  . Blood transfusion 08/2011  . Lower GI bleeding 08/2011 and 01/2014  . Chronic daily headache   . Migraines   . Stroke summer 2011    "made my left hip worse"  . Uterine cancer   . Nonischemic cardiomyopathy 05/2010    Left heart catheterization:2011. Nonobstructive coronary artery disease.  . Pulmonary hypertension   . Hypertension   . NSTEMI (non-ST elevated myocardial infarction) 01/2014    type II with CVA  . Acute embolic stroke 3/50/0938  . Non-ST elevation MI (NSTEMI) 02/04/2014  . E. coli UTI 03/02/2014  . Renal failure, acute 02/04/2014  . Iron deficiency anemia secondary to blood loss (chronic) 01/06/2008    Qualifier: Diagnosis of  By: Tomasa Hosteller MD, Veronique D.   . Nonsustained ventricular tachycardia    Past Surgical History:  Past Surgical History  Procedure Laterality Date  .  Esophagogastroduodenoscopy  08/26/2011    Procedure: ESOPHAGOGASTRODUODENOSCOPY (EGD);  Surgeon: [name], MD;  Location: [facility] ENDOSCOPY;  Service: Endoscopy;  Laterality: N/A;  . Colonoscopy  08/28/2011    Procedure: COLONOSCOPY;  Surgeon: [name], MD;  Location: [place];  Service: Endoscopy;  Laterality: N/A;  . Vaginal hysterectomy    . Tubal ligation    . Cataract extraction w/ intraocular lens  implant, bilateral    . Toe surgery      "right big toe; operated on it to straighten it out; it was under"  . Polysomnogram  10/17/2005    AHI-7.28/hr. AHI REM-20.8/hr. Average oxygen saturation range during REM and NREM was 97%. Lowest oxygen saturation during REM sleep was 90%.  . Carotid duplex  05/28/2010    No significant extracranial carotid artery stenosis demonstrated. Vertebrals are patent w/ antegrade flow.  . Cardiac catheterization  05/24/2010    No intervention - recommed medical therapy.  . Cardiovascular stress test  08/07/2006    Moderate-severe defect seen in Basal inferior, Mid inferoseptal, Mid inferior, Mid inferolateral, and Apical inferior regions - consistent w/ infarct/scar. No scintigraphic evidence of inducible myocardial ischemia.  . Transthoracic echocardiogram  08/29/2011    EF 55-60%, moderate LVH,   . Esophagogastroduodenoscopy N/A 02/08/2014    Procedure: ESOPHAGOGASTRODUODENOSCOPY (EGD);  Surgeon: [name], MD;  Location: [facility] ENDOSCOPY;  Service: Endoscopy;  Laterality: N/A;   HPI:  80 y.o. female with history of  Afib not on AC due to previous GI bleed. Patient brought to ED due to lethargy and SOB. MRI reviewed by neuro and reveals 2 right parietal acute infarct and possibly one left small acute infarct. CXR shows possible infection, but pt with CHF also. GI also seeing pt, reports Erosive, ulcerative gastritis on previous EGDs. Pt planned to have colonoscopy tomorrow.    Assessment / Plan / Recommendation Clinical Impression  Despite reports  from RN that pt coughed with solids and report from family of mild globus/cough with meals, pt tolerated all textures very well. No evidence of aspiration and no signficiant findings suspicious for esophageal dysphagia. Pt does have known erosive esophagitis, so some difficutly expected. Discussed basic esopahgeal precautions with the pt and family. Though pt's denture is at home, she is able to [facility] well and may initiate a regular texture diet with thin liquids when cleared by MD. Paged MD with recommendation, but will leave wiritng diet to MD as pt was previously on clear liquids and will have GI testing tomorrow.     Aspiration Risk  Mild    Diet Recommendation Regular;Thin liquid   Liquid Administration via: Cup;Straw Medication Administration: Whole meds with liquid Supervision: Patient able to self feed Postural Changes and/or Swallow Maneuvers: Seated upright 90 degrees    Other  Recommendations Oral Care Recommendations: Oral care BID   Follow Up Recommendations  None    Frequency and Duration        Pertinent Vitals/Pain NA    SLP Swallow Goals     Swallow Study Prior Functional Status       General HPI: 80 y.o. female with history of Afib not on AC due to previous GI bleed. Patient brought to ED due to lethargy and SOB. MRI reviewed by neuro and reveals 2 right parietal acute infarct and possibly one left small acute infarct. CXR shows possible infection, but pt with CHF also. GI also seeing pt, reports Erosive, ulcerative gastritis on previous EGDs. Pt planned to have colonoscopy tomorrow.  Type of Study: Bedside swallow evaluation Previous Swallow Assessment: see HPI Diet Prior to this Study: NPO Temperature Spikes Noted: No Respiratory Status: Room air History of Recent Intubation: No Behavior/Cognition: Alert;Cooperative Oral Cavity - Dentition: Dentures, top;Dentures, not available Self-Feeding Abilities: Able to feed self Patient Positioning: Upright in  bed Baseline Vocal Quality: Clear Volitional Cough: Strong Volitional Swallow: Able to elicit    Oral/Motor/Sensory Function Overall Oral Motor/Sensory Function: Appears within functional limits for tasks assessed   Ice Chips     Thin Liquid Thin Liquid: Within functional limits Presentation: Cup;Straw;Self Fed    Nectar Thick Nectar Thick Liquid: Not tested   Honey Thick Honey Thick Liquid: Not tested   Puree Puree: Within functional limits   Solid   GO    Solid: Within functional limits (missing upper denture, but able to masticate)      Herbie Baltimore, MA CCC-SLP [phone]   Lynann Beaver 05/11/2014,11:04 AM

## 2014-05-11 NOTE — Clinical Documentation Improvement (Signed)
Clinical Documentation Clarification #1:   Possible Clinical Conditions?    Encephalopathy (describe type if known)                       Anoxic                       Septic                       Alcoholic                        Hepatic                       Hypertensive                       Metabolic                       Toxic Traumatic brain hemorrhage Drug induced delirium  Hyponatremia / Hypernatremia Poisoning / Overdose Other Condition Cannot Clinically Determine    Risk Factors: AMS noted per 7/29 progress notes.  ________________________________________________________________________________________________ Clinical Documentation Clarification #2:   Possible Clinical Conditions?   Acute Blood Loss Anemia Acute/Chronic Blood Loss Anemia Anemia due to Chronic Kidney Disease Other Condition Cannot Clinically Determine   Risk Factors: Profound anemia per 7/29 progress notes. Symptomatic anemia, hemoglobin 4.7 on admission; hemoglobin 6.6 after 2 units PRBC per 7/30 progress notes.   Thank You, Theron Arista, Clinical Documentation Specialist:  [phone]  [facility] Information Management

## 2014-05-11 NOTE — Progress Notes (Signed)
Stroke Team Progress Note  HISTORY [name] is an 80 y.o. female who lives with her husband at home. She has history of CVA, atrial fibrillation (recent event monitors did not show A. Fib it did show some PVCs and short bursts of nonsustained ventricular tachycardia.) recent NSTEMI with outpatient evaluation by cardiology during the stress test done yesterday 2/95/2841, combined systolic and diastolic heart failure (ejection fraction of 32-44%, grade 2 diastolic dysfunction on 0/07/2724). She has been on [facility] in past but was taken off this due to GI bleed. Since then she has been on ASA. Over the last two days daughter and husband has noted she has become very tired, weak and SOB. She was brought to ED 05/09/2014 due to her SOB. Hg was noted to be 4.7 and HCT of 15.8. FOBT (+). MRI was obtained while in hospital which showed 2 right parietal infarcts. Neurology was consulted for CVA. History was obtained from family as patient is very lethargic and not vocalizing. Unable to determine last known well. Patient was not administered TPA secondary to delay in arrival. She was admitted for further evaluation and treatment.  SUBJECTIVE Her female family member is at the bedside.  She received blood transfusion for her anemia.no new complaints  OBJECTIVE Most recent Vital Signs: Filed Vitals:   05/11/14 0300 05/11/14 0335 05/11/14 0355 05/11/14 0450  BP: 136/41 139/47 138/46 137/44  Pulse: 62 63 59 63  Temp: 98.8 F (37.1 C) 98.8 F (37.1 C) 98.5 F (36.9 C) 98.7 F (37.1 C)  TempSrc: Oral Oral  Oral  Resp: 19 23 20 20   Height:      Weight:      SpO2: 100% 100% 100% 100%   CBG (last 3)   Recent Labs  05/10/14 1742 05/10/14 2153 05/11/14 0334  GLUCAP 282* 339* 255*    IV Fluid Intake:   . sodium chloride 50 mL/hr at 05/11/14 0600    MEDICATIONS  . atorvastatin  40 mg Oral q1800  . bisacodyl  5 mg Oral BID  . insulin aspart  0-15 Units Subcutaneous 6 times per day  . insulin  glargine  5 Units Subcutaneous QHS  . isosorbide mononitrate  60 mg Oral Daily  . lisinopril  5 mg Oral Daily  . magnesium oxide  400 mg Oral BID  . metoprolol succinate  25 mg Oral Daily  . pantoprazole  40 mg Oral BID  . sodium chloride  3 mL Intravenous Q12H   PRN:  acetaminophen, albuterol  Diet:  Clear Liquid thin liquids Activity:  Bedrest DVT Prophylaxis:  SCDs   CLINICALLY SIGNIFICANT STUDIES Basic Metabolic Panel:  Recent Labs Lab 05/10/14 0735 05/11/14 0426  NA 138 139  K 5.3 4.6  CL 103 110  CO2 14* 16*  GLUCOSE 218* 236*  BUN 56* 56*  CREATININE 2.98* 2.63*  CALCIUM 8.8 8.4  MG  --  2.1   Liver Function Tests:  Recent Labs Lab 05/10/14 0735 05/11/14 0426  AST 73* 76*  ALT 78* 89*  ALKPHOS 185* 140*  BILITOT 0.4 0.8  PROT 6.8 5.7*  ALBUMIN 2.7* 2.2*   CBC:  Recent Labs Lab 05/10/14 2253 05/11/14 0426  WBC 9.6 11.0*  HGB 6.6* 7.5*  HCT 20.6* 22.9*  MCV 81.7 79.8  PLT 318 368   Coagulation:  Recent Labs Lab 05/10/14 0924  LABPROT 17.3*  INR 1.41   Cardiac Enzymes:  Recent Labs Lab 05/10/14 1023 05/10/14 1650 05/10/14 2203  TROPONINI 0.57* 0.46* 0.39*  Urinalysis:  Recent Labs Lab 05/10/14 0902  COLORURINE YELLOW  LABSPEC 1.017  PHURINE 5.0  GLUCOSEU NEGATIVE  HGBUR NEGATIVE  BILIRUBINUR NEGATIVE  KETONESUR NEGATIVE  PROTEINUR 30*  UROBILINOGEN 0.2  NITRITE NEGATIVE  LEUKOCYTESUR NEGATIVE   Lipid Panel    Component Value Date/Time   CHOL 130 02/09/2014 0650   TRIG 61 02/09/2014 0650   HDL 60 02/09/2014 0650   CHOLHDL 2.2 02/09/2014 0650   VLDL 12 02/09/2014 0650   LDLCALC 58 02/09/2014 0650   HgbA1C  Lab Results  Component Value Date   HGBA1C 11.5* 02/04/2014    Urine Drug Screen:     Component Value Date/Time   LABOPIA NONE DETECTED 07/10/2008 1721   COCAINSCRNUR NONE DETECTED 07/10/2008 1721   LABBENZ NONE DETECTED 07/10/2008 1721   AMPHETMU NONE DETECTED 07/10/2008 1721   THCU NONE DETECTED 07/10/2008 1721    LABBARB  Value: NONE DETECTED        DRUG SCREEN FOR MEDICAL PURPOSES ONLY.  IF CONFIRMATION IS NEEDED FOR ANY PURPOSE, NOTIFY LAB WITHIN 5 DAYS. 07/10/2008 1721    Alcohol Level: No results found for this basename: ETH,  in the last 168 hours  CT of the brain  05/10/2014   1. Diffuse lucency noted in the lower right temporal lobe most likely beam hardening artifact. A right temporal lobe infarct cannot be completely excluded. 2. Multiple periventricular punctate lucencies consistent with previously identified multiple ischemic foci . Similar findings noted on prior MRI brain 02/07/2014.    MRI of the brain  05/10/2014   Two areas of acute infarct in the right parietal lobe.  Moderate chronic microvascular ischemic change with diffuse atrophy.     MRA of the brain    Carotid Doppler  01/03/2014 No evidence of hemodynamically significant internal carotid artery stenosis. Vertebral artery flow is antegrade. No change since prior study 09/17/06  2D Echocardiogram  EF 40-450% with no source of embolus. Trivial AV regurg.  CXR  05/10/2014    Mild left base opacification which may represent atelectasis or infection.  Moderate stable cardiomegaly.     EKG  normal sinus rhythm. For complete results please see formal report.   Therapy Recommendations   Physical Exam   Frail elderly African american lady not in distress.Awake alert. Afebrile. Head is nontraumatic. Neck is supple without bruit. Hearing is normal. Cardiac exam no murmur or gallop. Lungs are clear to auscultation. Distal pulses are well felt. Neurological Exam : Awake alert oriented x 3 normal speech and language. Mild left lower face asymmetry. Tongue midline. No drift. Mild diminished fine finger movements on left. Orbits right over left upper extremity. Mild left grip weak.. Normal sensation . Normal coordination. ASSESSMENT [name] is a 80 y.o. female presenting to the ED with SOB. MRI imaging done during workup confirms 2 acute  right parietal infarcts. Infarcts felt to be embolic secondary to known atrial fibrillation.  On aspirin 81 mg orally every day prior to admission. Now on  no anticoagulant secondary to GIB, anemia for secondary stroke prevention. Stroke work up underway.   Hx Lower GI bleeding 08/2011 and 01/2014. Bled on anticoagulation at those times. Hgb low this admission and has already been transfused.   atrial fibrillation, not an anticoagulation candidate secondary to hemorrhage  CAD, MI 2006 & 01/2014  Hx CHF. Past 2D with EF 25-30% with dilated LV, mild LVH, severe hypokinesis, and mod-severe reduction in RV function   HYPERTENSION, on norvasc, imdur, lisinopril, metoprolol Diabetes,  uncontrolled, HgbA1c 11.5 in April 2015, goal < 7.0 Hyperlipidemia, LDL 58 in April 2015, on lipitor 40 mg daily PTA that has been continued in hospital, at goal LDL < 70 for diabetics obstructive sleep apnea Chronic daily headache, migraines Hx stroke  Embolic 4/96/7591  Embolic Summer 6384  Cr 6.65   Hospital day # 1  TREATMENT/PLAN  Patient is not an antithrombotic candidate at this time due to anemia and GIB.   No need for MRA as source of stroke already identified and findings will not change treatmetn  Therapy evals.  Continue statin at time of discharge  No further stroke workup indicated.  Patient has a 10-15% risk of having another stroke over the next year, the highest risk is within 2 weeks of the most recent stroke/TIA (risk of having a stroke following a stroke or TIA is the same).  Ongoing risk factor control by Primary Care Physician  Stroke Service will sign off. Please call should any needs arise.  Follow up with Dr. Erlinda Hong, [facility], in 2 months.  Burnetta Sabin, MSN, RN, ANVP-BC, ANP-BC, GNP-BC [facility] Pager: [phone] 05/11/2014 12:01 PM I have personally examined this patient, reviewed notes, independently viewed imaging studies, participated in medical  decision making and plan of care. I have made any additions or clarifications directly to the above note. Agree with note above.  Unfortunate  And complex situation  Needing medical decision making of high complexity with AFIB she is at high risk for recurrrent strokes and TIAs but due to h/o GI bleed x 2 and severe anemia anticoagulation will be dangerous and not advisable. Continue aspirin  Antony Contras, MD Medical Director [facility] Pager: [phone] 05/11/2014 2:22 PM  SIGNED    To contact Stroke Continuity provider, please refer to [email or web address] After hours, contact General Neurology

## 2014-05-11 NOTE — Progress Notes (Signed)
Utilization review completed.  

## 2014-05-11 NOTE — Progress Notes (Signed)
S: 80 y.o. female has a past medical history significant for recent CVA, atrial fibrillation, recent NSTEMI with outpatient evaluation by cardiology during the stress test done yesterday, combined systolic and diastolic heart failure (ejection fraction of 46-56%, grade 2 diastolic dysfunction on 05/24/7516), is being brought to the emergency room by her son shortness of breath and weakness. Patient secondary to continued symptomatic anemia.    A/P  Acute CVA -Acute infarct in the right parietal lobe -Q.4hr neuro check -DC Norvasc will allow patient to have progressive HTN to ensure adequate cerebral perfusion in face of acute CVA   Symptomatic anemia -On admission patient's hemoglobin 4.7, after 2 units PRBC hemoglobin= 6.6 -7/30 transfuse one unit PRBC; Lasix IV 40 mg x1 prior to transfusion - maintain hemoglobin> 8;   Combined diastolic and systolic CHF -Control risk factors of HTN, diabetes, HLD  NSTEMI -Patient evaluated by cardiology 7/28 with nuclear stress test;Low risk stress nuclear study with an inferior wall scar and mildly depressed LV systolic function.   Uncontrolled diabetes type 2 -Increase SSI to moderate -Obtain hemoglobin A1c    Significant events 7/29 MRI brain without contrast;Two areas of acute infarct in the right parietal lobe.  -Moderate chronic microvascular ischemic change with diffuse atrophy. 3/18 echocardiogram - LVEF= 40% to 45%. (grade 2 diastolic dysfunction). - Aortic valve: Trivial regurgitation. - Mitral valve: Moderate regurgitation directed centrally.

## 2014-05-11 NOTE — Progress Notes (Addendum)
PT follow up cbc after 2 units of PRBC went up from 4.7 to 6.6 Spoke to dr Sherral Hammers who ordered 1 unit of PR BC followed by 40 mg of lasix will continue to monitor.

## 2014-05-11 NOTE — Progress Notes (Deleted)
Discussed discharge instructions, follow up appointments and prescriptions with pt. Pt verbalizes understanding but says he needs a different prescription for pain management because he is allergic to oxycodone. Spoke with Suzzanne Cloud, PA. Says she will come write a new prescription. Will D/C pt when resolved.

## 2014-05-11 NOTE — Progress Notes (Signed)
[facility] PROGRESS NOTE  [name] ZOX:[id number] DOB: 1933/03/03 DOA: 05/10/2014 PCP: [name], MD Interim summary: [name] is a 80 y.o. female has a past medical history significant for recent CVA, atrial fibrillation, recent NSTEMI with outpatient evaluation by cardiology during the stress test done yesterday, combined systolic and diastolic heart failure (ejection fraction of 81-19%, grade 2 diastolic dysfunction on 1/47/8295), is being brought to the emergency room by her son shortness of breath and weakness and lethargy. She was found to be anemic with a hemoglobin of 4.7, with her baseline around 8. She is currently off anti coagulation secondary to Gi bleed in the past. She is currently on aspirin 81 mg . She was admitted to [facility] service and received a total of 3 units of prbc and her H&h has improved to 7.5. An MRI was done on admission to evaluate for stroke , showed 2 acute areas of infarct.  Neurology and gastroenterology were consulted. plan for colonoscopy in am.    Assessment/Plan:  Acute encephalopathy probably  secondary to acute stroke; - admitted to step down. Neurology consulted, no new recommendations except to continue with statin. Cannot use aspirin secondary to Gi bleed and anemia. Therapy evals when patient is able to. Swallow evaluation recommended regular diet with thin  Liquids. Plan to recommend with Dr Erlinda Hong in 2 months.    GI bleed/ symptomatic anemia:  Received a total of 3 units of prbc transfusion and hemoglobin is 7.5. Plan to repeat H&H this afternoon and transfuse to keep hemoglobin >8.  GI consulted and plan for colonoscopy in am.   H/o diastolic and systolic heart failure; Appears to be compensated. No pedal edema. cxr does not reveal pulmonary edema.   Elevated troponin's; Probably secondary to demand ischemia from severe anemia. Patient evaluated by cardiology 7/28 with nuclear stress test;Low risk stress nuclear study with an inferior  wall scar and mildly depressed LV systolic function. Continue to monitor for chest pain and sob.  Currently she is chest pain free.   Stage 4 CKD Creatinine at baseline.   Uncontrolled DM: CBG (last 3)   Recent Labs  05/11/14 0334 05/11/14 0824 05/11/14 1156  GLUCAP 255* 193* 246*    On 5 unit sof lantus, increased to 8 units of lantus, moderate SSI .   Mild leukocytosis: - afebrile, . ? reactive.    Code Status: DNR Family Communication: DAUGHTER AT Bedside Disposition Plan: remain in SDU.   Consultants:  Neurology  gastroenterology  Procedures:  MRI brain.  Antibiotics:  none  HPI/Subjective: Reports right sided headache, no chest pain /sob.   Objective: Filed Vitals:   05/11/14 1157  BP: 139/47  Pulse: 64  Temp: 98.7 F (37.1 C)  Resp: 26    Intake/Output Summary (Last 24 hours) at 05/11/14 1304 Last data filed at 05/11/14 0826  Gross per 24 hour  Intake 1207.5 ml  Output   1525 ml  Net -317.5 ml   Filed Weights   05/10/14 0623 05/10/14 1600  Weight: 71.668 kg (158 lb) 70.7 kg (155 lb 13.8 oz)    Exam:   General:  Alert afebrile comfortable, reports mild right sided headache.   Cardiovascular: s1s2, no m/r/g. No pedal edema  Respiratory: CTAB, no wheezing or rhonchi.   Abdomen: soft NT nd bs+  Musculoskeletal: no pedal edema, and normal ROM.   Neuro: alert and oriented to person and place an dnot to time. Able to move all extremities. No facial droop and speech  is normal. Slightly confused.   Data Reviewed: Basic Metabolic Panel:  Recent Labs Lab 05/10/14 0735 05/11/14 0426  NA 138 139  K 5.3 4.6  CL 103 110  CO2 14* 16*  GLUCOSE 218* 236*  BUN 56* 56*  CREATININE 2.98* 2.63*  CALCIUM 8.8 8.4  MG  --  2.1   Liver Function Tests:  Recent Labs Lab 05/10/14 0735 05/11/14 0426  AST 73* 76*  ALT 78* 89*  ALKPHOS 185* 140*  BILITOT 0.4 0.8  PROT 6.8 5.7*  ALBUMIN 2.7* 2.2*   No results found for this  basename: LIPASE, AMYLASE,  in the last 168 hours  Recent Labs Lab 05/10/14 0738  AMMONIA 29   CBC:  Recent Labs Lab 05/10/14 0735 05/10/14 0832 05/10/14 2253 05/11/14 0426  WBC 10.7* 10.5 9.6 11.0*  HGB 4.9* 4.7* 6.6* 7.5*  HCT 16.5* 15.8* 20.6* 22.9*  MCV 80.9 81.9 81.7 79.8  PLT 474* 442* 318 368   Cardiac Enzymes:  Recent Labs Lab 05/10/14 1023 05/10/14 1650 05/10/14 2203  TROPONINI 0.57* 0.46* 0.39*   BNP (last 3 results)  Recent Labs  12/03/13 1808 12/05/13 0448 05/10/14 0736  PROBNP 7767.0* 6023.0* 14464.0*   CBG:  Recent Labs Lab 05/10/14 1742 05/10/14 2153 05/11/14 0334 05/11/14 0824 05/11/14 1156  GLUCAP 282* 339* 255* 193* 246*    Recent Results (from the past 240 hour(s))  MRSA PCR SCREENING     Status: None   Collection Time    05/10/14  3:52 PM      Result Value Ref Range Status   MRSA by PCR NEGATIVE  NEGATIVE Final   Comment:            The GeneXpert MRSA Assay (FDA     approved for NASAL specimens     only), is one component of a     comprehensive MRSA colonization     surveillance program. It is not     intended to diagnose MRSA     infection nor to guide or     monitor treatment for     MRSA infections.     Studies: Dg Chest 2 View  05/10/2014   CLINICAL DATA:  Shortness of breath with generalized body aches.  EXAM: CHEST  2 VIEW  COMPARISON:  02/04/2014  FINDINGS: Lungs are adequately inflated without effusion. Subtle opacification over the left lung base as cannot exclude atelectasis or infection. Some of this density on the lateral film is likely due to the enlarged heart and overlapping vascular structures. Moderate stable cardiomegaly. Mild calcified plaque over the thoracic aorta. Remainder of the exam is unchanged.  IMPRESSION: Mild left base opacification which may represent atelectasis or infection.  Moderate stable cardiomegaly.   Electronically Signed   By: Marin Olp M.D.   On: 05/10/2014 08:05   Ct Head Wo  Contrast  05/10/2014   CLINICAL DATA:  Leg pain.  Shortness of breath.  EXAM: CT HEAD WITHOUT CONTRAST  TECHNIQUE: Contiguous axial images were obtained from the base of the skull through the vertex without intravenous contrast.  COMPARISON:  MRI 01/30/2014.  Head CT 02/04/2014.  FINDINGS: Bilateral multifocal periventricular lucencies are noted consistent with prior identified periventricular ischemic foci. Similar findings noted on prior MRI of 02/07/2014. Lucency is noted throughout the lower half right temporal lobe. This is most likely related to beam hardening artifact although a right temporal lobe infarct cannot be completely excluded. No mass. No hemorrhage. No acute bony abnormality.  IMPRESSION: 1.  Diffuse lucency noted in the lower right temporal lobe most likely beam hardening artifact. A right temporal lobe infarct cannot be completely excluded. 2. Multiple periventricular punctate lucencies consistent with previously identified multiple ischemic foci . Similar findings noted on prior MRI brain 02/07/2014.   Electronically Signed   By: Marcello Moores  Register   On: 05/10/2014 08:33   Mr Brain Wo Contrast  05/10/2014   CLINICAL DATA:  Left-sided weakness  EXAM: MRI HEAD WITHOUT CONTRAST  TECHNIQUE: Multiplanar, multiecho pulse sequences of the brain and surrounding structures were obtained without intravenous contrast.  COMPARISON:  CT 05/10/2014  FINDINGS: Image quality degraded by motion  Acute infarcts involving the right parietal lobe involving cortex and white matter. No other areas of acute infarct.  Multiple well-defined white matter hyperintensities consistent with chronic ischemia. Small focus of hemorrhage in the right occipital lobe is likely chronic. Negative for mass lesion.  Diffuse atrophy. Ventricle size within normal limits for the level of atrophy.  IMPRESSION: Two areas of acute infarct in the right parietal lobe.  Moderate chronic microvascular ischemic change with diffuse atrophy.    Electronically Signed   By: Franchot Gallo M.D.   On: 05/10/2014 12:20    Scheduled Meds: . atorvastatin  40 mg Oral q1800  . bisacodyl  5 mg Oral BID  . insulin aspart  0-15 Units Subcutaneous 6 times per day  . insulin glargine  8 Units Subcutaneous QHS  . isosorbide mononitrate  60 mg Oral Daily  . lisinopril  5 mg Oral Daily  . magnesium oxide  400 mg Oral BID  . metoprolol succinate  25 mg Oral Daily  . pantoprazole  40 mg Oral BID  . polyethylene glycol  1,000 mL Oral Once   And  . [START ON 05/12/2014] polyethylene glycol  1,000 mL Oral Once  . sodium chloride  3 mL Intravenous Q12H   Continuous Infusions:   Active Problems:   DM (diabetes mellitus), type 2, uncontrolled   HYPERLIPIDEMIA   Iron deficiency anemia secondary to blood loss (chronic)   HYPERTENSION   CAD (coronary artery disease)   Memory difficulties   Chronic diastolic CHF EF 11%   NSTEMI (non-ST elevated myocardial infarction)   Atrial fibrillation   Anemia   GI bleed   Melena   Acute ischemic stroke    Time spent: 35 minutes.     [facility] Pager [phone] If 7PM-7AM, please contact night-coverage at [email or web address], password [facility] 05/11/2014, 1:04 PM  LOS: 1 day

## 2014-05-11 NOTE — Progress Notes (Signed)
    Progress Note   Subjective   No further BMs per pt and family in room.  Some mild intermittent confusion per family but near baseline.  Improved since admittance.  Denies SOB and CP.    Objective   Vital signs in last 24 hours: Temp:  [98 F (36.7 C)-99.1 F (37.3 C)] 98.4 F (36.9 C) (07/30 0825) Pulse Rate:  [56-68] 62 (07/30 0825) Resp:  [15-26] 22 (07/30 0825) BP: (120-168)/(36-98) 140/43 mmHg (07/30 0825) SpO2:  [95 %-100 %] 100 % (07/30 0825) Weight:  [155 lb 13.8 oz (70.7 kg)] 155 lb 13.8 oz (70.7 kg) (07/29 1600) Last BM Date: 05/09/14 General:    Thin elderly female in NAD Heart:  RRR, No MGR noted. Lungs: CTA bilatteral.  Unlabored and even. Abdomen:  Mild epigastric tenderness. Soft, ND. Normal bowel sounds. Extremities:  Without edema.  2+ DP and radial pulses. Neurologic:  Alert and oriented to place, time, and self.  Psych:  Cooperative. Normal mood and affect.   Lab Results:  Recent Labs  05/10/14 0832 05/10/14 2253 05/11/14 0426  WBC 10.5 9.6 11.0*  HGB 4.7* 6.6* 7.5*  HCT 15.8* 20.6* 22.9*  PLT 442* 318 368   BMET  Recent Labs  05/10/14 0735 05/11/14 0426  NA 138 139  K 5.3 4.6  CL 103 110  CO2 14* 16*  GLUCOSE 218* 236*  BUN 56* 56*  CREATININE 2.98* 2.63*  CALCIUM 8.8 8.4         Assessment / Plan:    * Anemia :  Recurrent. Normocytic. On BID po Iron. Chronic disease likely a contributing factor. * FOBT +. Recurrent.  * Erosive, ulcerative gastritis on previous EGDs. On daily Omeprazole at home, also low dose ASA  * CVA hx. On ASA  Colonoscopy scheduled for 05/12/14. Will need prep tonight and/or tomorrow morning. If negative, Capsule endo of small bowel.     LOS: 1 day   Lollie Marrow  05/11/2014, 9:40 AM    I have personally seen the patient, reviewed and repeated key elements of the history and physical and participated in formation of the assessment and plan the student has documented.  Plan for  colonoscopy tomorrow.  [name], MD, [facility] [phone] (pager) 05/11/2014 5:21 PM

## 2014-05-12 ENCOUNTER — Encounter (HOSPITAL_COMMUNITY)
Admission: EM | Disposition: A | Discharge status: Rehabilitation Facility | Payer: Self-pay | Source: Home / Self Care | Attending: Internal Medicine

## 2014-05-12 DIAGNOSIS — E119 Type 2 diabetes mellitus without complications: Secondary | ICD-10-CM

## 2014-05-12 DIAGNOSIS — K922 Gastrointestinal hemorrhage, unspecified: Secondary | ICD-10-CM

## 2014-05-12 DIAGNOSIS — G9340 Encephalopathy, unspecified: Secondary | ICD-10-CM

## 2014-05-12 DIAGNOSIS — N184 Chronic kidney disease, stage 4 (severe): Secondary | ICD-10-CM

## 2014-05-12 DIAGNOSIS — D500 Iron deficiency anemia secondary to blood loss (chronic): Secondary | ICD-10-CM

## 2014-05-12 HISTORY — PX: COLONOSCOPY: SHX5424

## 2014-05-12 LAB — GLUCOSE, CAPILLARY
GLUCOSE-CAPILLARY: 125 mg/dL — AB (ref 70–99)
GLUCOSE-CAPILLARY: 215 mg/dL — AB (ref 70–99)
GLUCOSE-CAPILLARY: 235 mg/dL — AB (ref 70–99)
GLUCOSE-CAPILLARY: 91 mg/dL (ref 70–99)
Glucose-Capillary: 116 mg/dL — ABNORMAL HIGH (ref 70–99)
Glucose-Capillary: 120 mg/dL — ABNORMAL HIGH (ref 70–99)
Glucose-Capillary: 128 mg/dL — ABNORMAL HIGH (ref 70–99)
Glucose-Capillary: 146 mg/dL — ABNORMAL HIGH (ref 70–99)

## 2014-05-12 LAB — TYPE AND SCREEN
ABO/RH(D): B POS
Antibody Screen: NEGATIVE
UNIT DIVISION: 0
Unit division: 0
Unit division: 0

## 2014-05-12 LAB — BASIC METABOLIC PANEL
ANION GAP: 12 (ref 5–15)
BUN: 47 mg/dL — ABNORMAL HIGH (ref 6–23)
CALCIUM: 8.4 mg/dL (ref 8.4–10.5)
CO2: 18 mEq/L — ABNORMAL LOW (ref 19–32)
Chloride: 108 mEq/L (ref 96–112)
Creatinine, Ser: 2.11 mg/dL — ABNORMAL HIGH (ref 0.50–1.10)
GFR, EST AFRICAN AMERICAN: 25 mL/min — AB (ref >90)
GFR, EST NON AFRICAN AMERICAN: 21 mL/min — AB (ref >90)
Glucose, Bld: 80 mg/dL (ref 70–99)
Potassium: 4.3 mEq/L (ref 3.7–5.3)
SODIUM: 138 meq/L (ref 137–147)

## 2014-05-12 LAB — CBC
HCT: 26.1 % — ABNORMAL LOW (ref 36.0–46.0)
Hemoglobin: 8.4 g/dL — ABNORMAL LOW (ref 12.0–15.0)
MCH: 25.7 pg — ABNORMAL LOW (ref 26.0–34.0)
MCHC: 32.2 g/dL (ref 30.0–36.0)
MCV: 79.8 fL (ref 78.0–100.0)
PLATELETS: 366 10*3/uL (ref 150–400)
RBC: 3.27 MIL/uL — ABNORMAL LOW (ref 3.87–5.11)
RDW: 17 % — ABNORMAL HIGH (ref 11.5–15.5)
WBC: 12.2 10*3/uL — ABNORMAL HIGH (ref 4.0–10.5)

## 2014-05-12 SURGERY — COLONOSCOPY
Anesthesia: Moderate Sedation

## 2014-05-12 MED ORDER — POLYETHYLENE GLYCOL 3350 17 G PO PACK
51.0000 g | PACK | Freq: Once | ORAL | Status: AC
  Administered 2014-05-12: 17 g via ORAL
  Filled 2014-05-12: qty 3

## 2014-05-12 MED ORDER — SODIUM CHLORIDE 0.9 % IV SOLN: INTRAVENOUS | Status: DC

## 2014-05-12 MED ORDER — PNEUMOCOCCAL VAC POLYVALENT 25 MCG/0.5ML IJ INJ
0.5000 mL | INJECTION | INTRAMUSCULAR | Status: AC
  Administered 2014-05-14: 0.5 mL via INTRAMUSCULAR
  Filled 2014-05-12: qty 0.5

## 2014-05-12 MED ORDER — MIDAZOLAM HCL 5 MG/ML IJ SOLN
INTRAMUSCULAR | Status: AC
  Filled 2014-05-12: qty 2

## 2014-05-12 MED ORDER — FENTANYL CITRATE 0.05 MG/ML IJ SOLN
INTRAMUSCULAR | Status: AC
  Filled 2014-05-12: qty 2

## 2014-05-12 MED ORDER — SODIUM CHLORIDE 0.9 % IV SOLN
510.0000 mg | Freq: Once | INTRAVENOUS | Status: AC
  Administered 2014-05-12: 510 mg via INTRAVENOUS
  Filled 2014-05-12: qty 17

## 2014-05-12 MED ORDER — FENTANYL CITRATE 0.05 MG/ML IJ SOLN
INTRAMUSCULAR | Status: DC | PRN
  Administered 2014-05-12: 25 ug via INTRAVENOUS
  Administered 2014-05-12 (×2): 12.5 ug via INTRAVENOUS

## 2014-05-12 MED ORDER — MIDAZOLAM HCL 5 MG/5ML IJ SOLN
INTRAMUSCULAR | Status: DC | PRN
Start: 2014-05-12 — End: 2014-05-12
  Administered 2014-05-12 (×3): 1 mg via INTRAVENOUS

## 2014-05-12 NOTE — Care Management Note (Signed)
    Page 1 of 1   05/15/2014     12:20:20 PM CARE MANAGEMENT NOTE 05/15/2014  Patient:  [name], [name]   Account Number:  [id number]  Date Initiated:  05/11/2014  Documentation initiated by:  Marvetta Gibbons  Subjective/Objective Assessment:   Pt admitted with anemia hgb 4.7, NSTEMI with elevated troponins, positive MRI for acute CVA,     Action/Plan:   PTA pt lived at home- pt was active with [facility] [place] prior to admission- if returns home will need resumption orders.   Anticipated DC Date:  05/15/2014   Anticipated DC Plan:  IP REHAB FACILITY      DC Planning Services  CM consult      Choice offered to / List presented to:             Status of service:  Completed, signed off Medicare Important Message given?  YES (If response is "NO", the following Medicare IM given date fields will be blank) Date Medicare IM given:  05/12/2014 Medicare IM given by:  Marvetta Gibbons Date Additional Medicare IM given:  05/15/2014 Additional Medicare IM given by:  Marvetta Gibbons  Discharge Disposition:  IP REHAB FACILITY  Per UR Regulation:  Reviewed for med. necessity/level of care/duration of stay  If discussed at [place] of Stay Meetings, dates discussed:    Comments:  05/15/14- 1200- Marvetta Gibbons RN, BSN [phone] Plan is for pt to d/c to [facility] today- additional copy of Medicare IM given to pt and son at bedside.  05/12/14- 1120- Marvetta Gibbons RN BSN [phone] [place] with pt's son at bedside- pt has w/c, rw, and shower chair at home- active with [facility] [place]- awaiting PT/OT evals for assistance with d/c recommendations- copy of admissions Medicare IM given to pt's son. NCM to continue to follow for d/c needs- per son family assist with pt's needs at home and someone is able to be with pt 24/7

## 2014-05-12 NOTE — Op Note (Signed)
[facility] [place], [address]   COLONOSCOPY PROCEDURE REPORT  PATIENT: [name], [name]  MR#: [id number] BIRTHDATE: [birth year] , 78  yrs. old GENDER: Female ENDOSCOPIST: [name], MD, [facility] PROCEDURE DATE:  05/12/2014 PROCEDURE:   Colonoscopy, diagnostic  ASA CLASS:   Class III INDICATIONS: MEDICATIONS: Fentanyl 50 mcg IV and Versed 4 mg IV  DESCRIPTION OF PROCEDURE:   After the risks benefits and alternatives of the procedure were thoroughly explained, informed consent was obtained.  A digital rectal exam revealed no abnormalities of the rectum.   The Pentax Adult Colon [id number] endoscope was introduced through the anus and advanced to the cecum, which was identified by both the appendix and ileocecal valve. No adverse events experienced.   The quality of the prep was adequate, using MoviPrep  The instrument was then slowly withdrawn as the colon was fully examined.      COLON FINDINGS: A normal appearing cecum, ileocecal valve, and appendiceal orifice were identified.  The ascending, hepatic flexure, transverse, splenic flexure, descending, sigmoid colon and rectum appeared unremarkable.  No polyps or cancers were seen. Retroflexed views revealed internal hemorrhoids. The time to cecum=5 minutes 0 seconds.  Withdrawal time=9 minutes 0 seconds. The scope was withdrawn and the procedure completed. COMPLICATIONS: There were no complications.  ENDOSCOPIC IMPRESSION: Normal colon  RECOMMENDATIONS: Enteroscopy tomorrow - consider placing capsule endoscope then feraheme x 1   eSigned:  [name], MD, [facility] 05/12/2014 3:34 PM

## 2014-05-12 NOTE — Progress Notes (Signed)
[place] TEAM 1 - Stepdown/ICU TEAM Progress Note  [name]:[id number] DOB: [birth year] DOA: 05/10/2014 PCP: [name], MD  Admit HPI / Brief Narrative: [name] is a 80 y.o. female has a past medical history significant for recent CVA, atrial fibrillation, recent NSTEMI with outpatient evaluation by cardiology during the stress test done yesterday, combined systolic and diastolic heart failure (ejection fraction of 54-56%, grade 2 diastolic dysfunction on 2/56/3893), is being brought to the emergency room by her son shortness of breath and weakness and lethargy. She was found to be anemic with a hemoglobin of 4.7, with her baseline around 8. She is currently off anti coagulation secondary to Gi bleed in the past. She is currently on aspirin 81 mg . She was admitted to [facility] service and received a total of 3 units of PRBC and her H./H has improved to 7.5. An MRI was done on admission to evaluate for stroke , showed 2 acute areas of infarct. Neurology and gastroenterology were consulted. plan for colonoscopy in am.   HPI/Subjective: 7/31 lethargic but arousable follows all commands (just returned from colonoscopy)   Assessment/Plan: Acute encephalopathy; Acute CVA  -Acute infarct in the right parietal lobe  -Q.4hr neuro check  -DC Norvasc will allow patient to have progressive HTN to ensure adequate cerebral perfusion in face of acute CVA  -PT/OT consult in a.m.  GI bleed/ symptomatic anemia:  -On admission patient's hemoglobin 4.7, after 2 units PRBC hemoglobin= 6.6  -7/30 transfuse one unit PRBC; Lasix IV 40 mg x1 prior to transfusion  - maintain hemoglobin> 8;  -Hemoglobin stable -S./P. colonoscopy; normal, scheduled for EGD in the A.m.  Combined diastolic and systolic CHF  -Control risk factors of HTN, diabetes, HLD  NSTEMI vs demand ischemia -Patient evaluated by cardiology 7/28 with nuclear stress test;Low risk stress nuclear study with an inferior wall scar and  mildly depressed LV systolic function -Currently chest pain-free/negative SOB   Stage 4 CKD  Creatinine at baseline.   Uncontrolled DM: -7/30 hemoglobin A1c= 7 -Continue moderate SSI   Code Status: FULL Family Communication: no family present at time of exam Disposition Plan:   Consultants: Dr. Silvano Rusk (GI) Dr.Pramod Leonie Man (neurology stroke team)   Procedure/Significant Events: 7/29 CXR;Mild left base opacification which may represent atelectasis or infection.  -Moderate stable cardiomegaly. 7/29 CT head without contrast;Diffuse lucency lower right temporal lobe most likely beam hardening artifact. `Rt temporal lobe infarct cannot be completely excluded.  - Multiple periventricular punctate lucencies consistent with previously identified multiple ischemic foci  7/29 MRI brain without contrast;Two areas of acute infarct Rt parietal lobe.  -Moderate chronic microvascular ischemic change with diffuse atrophy.  7/31 nuclear stress test;Low risk stress nuclear study with an inferior wall scar and mildly depressed LV systolic function; TDSK=87%. 7/31 colonoscopy; normal colon   Culture NA  Antibiotics: NA  DVT prophylaxis: SCD   Devices NA  LINES / TUBES:  7/30 22ga right forearm    Continuous Infusions: . sodium chloride 20 mL/hr at 05/12/14 1615    Objective: VITAL SIGNS: Temp: 98.6 F (37 C) (07/31 1606) Temp src: Oral (07/31 1606) BP: 157/55 mmHg (07/31 1606) Pulse Rate: 62 (07/31 1606) SPO2; 90% on room air FIO2:   Intake/Output Summary (Last 24 hours) at 05/12/14 1649 Last data filed at 05/12/14 1500  Gross per 24 hour  Intake    560 ml  Output   1450 ml  Net   -890 ml     Exam: General:  Lethargic (just returned from colonoscopy), arousable following commands answer questions appropriately No acute respiratory distress Lungs: Clear to auscultation bilaterally without wheezes or crackles Cardiovascular: Regular rate and rhythm without  murmur gallop or rub normal S1 and S2 Abdomen: Nontender, nondistended, soft, bowel sounds positive, no rebound, no ascites, no appreciable mass Extremities: No significant cyanosis, clubbing, or edema bilateral lower extremities  Data Reviewed: Basic Metabolic Panel:  Recent Labs Lab 05/10/14 0735 05/11/14 0426 05/12/14 0338  NA 138 139 138  K 5.3 4.6 4.3  CL 103 110 108  CO2 14* 16* 18*  GLUCOSE 218* 236* 80  BUN 56* 56* 47*  CREATININE 2.98* 2.63* 2.11*  CALCIUM 8.8 8.4 8.4  MG  --  2.1  --    Liver Function Tests:  Recent Labs Lab 05/10/14 0735 05/11/14 0426  AST 73* 76*  ALT 78* 89*  ALKPHOS 185* 140*  BILITOT 0.4 0.8  PROT 6.8 5.7*  ALBUMIN 2.7* 2.2*   No results found for this basename: LIPASE, AMYLASE,  in the last 168 hours  Recent Labs Lab 05/10/14 0738  AMMONIA 29   CBC:  Recent Labs Lab 05/10/14 0735 05/10/14 0832 05/10/14 2253 05/11/14 0426 05/11/14 1828 05/12/14 0338  WBC 10.7* 10.5 9.6 11.0*  --  12.2*  HGB 4.9* 4.7* 6.6* 7.5* 8.0* 8.4*  HCT 16.5* 15.8* 20.6* 22.9* 25.2* 26.1*  MCV 80.9 81.9 81.7 79.8  --  79.8  PLT 474* 442* 318 368  --  366   Cardiac Enzymes:  Recent Labs Lab 05/10/14 1023 05/10/14 1650 05/10/14 2203  TROPONINI 0.57* 0.46* 0.39*   BNP (last 3 results)  Recent Labs  12/03/13 1808 12/05/13 0448 05/10/14 0736  PROBNP 7767.0* 6023.0* 14464.0*   CBG:  Recent Labs Lab 05/11/14 2337 05/12/14 0410 05/12/14 0737 05/12/14 1124 05/12/14 1611  GLUCAP 125* 91 116* 128* 120*    Recent Results (from the past 240 hour(s))  MRSA PCR SCREENING     Status: None   Collection Time    05/10/14  3:52 PM      Result Value Ref Range Status   MRSA by PCR NEGATIVE  NEGATIVE Final   Comment:            The GeneXpert MRSA Assay (FDA     approved for NASAL specimens     only), is one component of a     comprehensive MRSA colonization     surveillance program. It is not     intended to diagnose MRSA     infection  nor to guide or     monitor treatment for     MRSA infections.     Studies:  Recent x-ray studies have been reviewed in detail by the Attending Physician  Scheduled Meds:  Scheduled Meds: . atorvastatin  40 mg Oral q1800  . ferumoxytol  510 mg Intravenous Once  . insulin aspart  0-15 Units Subcutaneous 6 times per day  . insulin glargine  8 Units Subcutaneous QHS  . isosorbide mononitrate  60 mg Oral Daily  . lisinopril  5 mg Oral Daily  . magnesium oxide  400 mg Oral BID  . metoprolol succinate  25 mg Oral Daily  . pantoprazole  40 mg Oral BID  . [START ON 05/13/2014] pneumococcal 23 valent vaccine  0.5 mL Intramuscular Tomorrow-1000  . polyethylene glycol  51 g Oral Once  . sodium chloride  3 mL Intravenous Q12H    Time spent on care of this patient: 40 mins  Allie Bossier , MD   [facility] Office  [phone] Pager [phone]  On-Call/Text Page:      [email or web address]      password [email or web address]  If 7PM-7AM, please contact night-coverage [email or web address] Password [email or web address] 05/12/2014, 4:49 PM   LOS: 2 days

## 2014-05-13 ENCOUNTER — Encounter (HOSPITAL_COMMUNITY): Payer: Self-pay | Admitting: *Deleted

## 2014-05-13 ENCOUNTER — Encounter (HOSPITAL_COMMUNITY)
Admission: EM | Disposition: A | Discharge status: Rehabilitation Facility | Payer: Self-pay | Source: Home / Self Care | Attending: Internal Medicine

## 2014-05-13 DIAGNOSIS — Q2733 Arteriovenous malformation of digestive system vessel: Secondary | ICD-10-CM

## 2014-05-13 DIAGNOSIS — K5521 Angiodysplasia of colon with hemorrhage: Secondary | ICD-10-CM

## 2014-05-13 HISTORY — DX: Angiodysplasia of colon with hemorrhage: K55.21

## 2014-05-13 HISTORY — PX: ENTEROSCOPY: SHX5533

## 2014-05-13 LAB — GLUCOSE, CAPILLARY
GLUCOSE-CAPILLARY: 104 mg/dL — AB (ref 70–99)
Glucose-Capillary: 97 mg/dL (ref 70–99)
Glucose-Capillary: 105 mg/dL — ABNORMAL HIGH (ref 70–99)
Glucose-Capillary: 137 mg/dL — ABNORMAL HIGH (ref 70–99)
Glucose-Capillary: 145 mg/dL — ABNORMAL HIGH (ref 70–99)

## 2014-05-13 LAB — COMPREHENSIVE METABOLIC PANEL
ALT: 71 U/L — AB (ref 0–35)
AST: 35 U/L (ref 0–37)
Albumin: 2.1 g/dL — ABNORMAL LOW (ref 3.5–5.2)
Alkaline Phosphatase: 120 U/L — ABNORMAL HIGH (ref 39–117)
Anion gap: 9 (ref 5–15)
BUN: 34 mg/dL — ABNORMAL HIGH (ref 6–23)
CALCIUM: 8.1 mg/dL — AB (ref 8.4–10.5)
CO2: 18 meq/L — AB (ref 19–32)
CREATININE: 1.8 mg/dL — AB (ref 0.50–1.10)
Chloride: 109 mEq/L (ref 96–112)
GFR calc Af Amer: 30 mL/min — ABNORMAL LOW (ref >90)
GFR calc non Af Amer: 26 mL/min — ABNORMAL LOW (ref >90)
Glucose, Bld: 96 mg/dL (ref 70–99)
Potassium: 4.1 mEq/L (ref 3.7–5.3)
SODIUM: 136 meq/L — AB (ref 137–147)
TOTAL PROTEIN: 5.6 g/dL — AB (ref 6.0–8.3)
Total Bilirubin: 0.6 mg/dL (ref 0.3–1.2)

## 2014-05-13 LAB — CBC WITH DIFFERENTIAL/PLATELET
BASOS ABS: 0 10*3/uL (ref 0.0–0.1)
BASOS PCT: 0 % (ref 0–1)
EOS ABS: 0.1 10*3/uL (ref 0.0–0.7)
Eosinophils Relative: 1 % (ref 0–5)
HEMATOCRIT: 23.6 % — AB (ref 36.0–46.0)
Hemoglobin: 7.5 g/dL — ABNORMAL LOW (ref 12.0–15.0)
LYMPHS PCT: 11 % — AB (ref 12–46)
Lymphs Abs: 1.1 10*3/uL (ref 0.7–4.0)
MCH: 25.5 pg — AB (ref 26.0–34.0)
MCHC: 31.8 g/dL (ref 30.0–36.0)
MCV: 80.3 fL (ref 78.0–100.0)
MONO ABS: 1 10*3/uL (ref 0.1–1.0)
Monocytes Relative: 10 % (ref 3–12)
Neutro Abs: 7.6 10*3/uL (ref 1.7–7.7)
Neutrophils Relative %: 78 % — ABNORMAL HIGH (ref 43–77)
PLATELETS: 321 10*3/uL (ref 150–400)
RBC: 2.94 MIL/uL — ABNORMAL LOW (ref 3.87–5.11)
RDW: 17.2 % — AB (ref 11.5–15.5)
WBC: 9.8 10*3/uL (ref 4.0–10.5)

## 2014-05-13 LAB — HEMOGLOBIN AND HEMATOCRIT, BLOOD
HEMATOCRIT: 24.2 % — AB (ref 36.0–46.0)
Hemoglobin: 7.7 g/dL — ABNORMAL LOW (ref 12.0–15.0)

## 2014-05-13 LAB — PREPARE RBC (CROSSMATCH)

## 2014-05-13 LAB — MAGNESIUM: Magnesium: 1.7 mg/dL (ref 1.5–2.5)

## 2014-05-13 SURGERY — ENTEROSCOPY
Anesthesia: Moderate Sedation

## 2014-05-13 MED ORDER — MIDAZOLAM HCL 10 MG/2ML IJ SOLN
INTRAMUSCULAR | Status: DC | PRN
  Administered 2014-05-13: 1 mg via INTRAVENOUS
  Administered 2014-05-13: 2 mg via INTRAVENOUS

## 2014-05-13 MED ORDER — FENTANYL CITRATE 0.05 MG/ML IJ SOLN
INTRAMUSCULAR | Status: AC
  Filled 2014-05-13: qty 2

## 2014-05-13 MED ORDER — BUTAMBEN-TETRACAINE-BENZOCAINE 2-2-14 % EX AERO
INHALATION_SPRAY | CUTANEOUS | Status: DC | PRN
  Administered 2014-05-13: 2 via TOPICAL

## 2014-05-13 MED ORDER — DIPHENHYDRAMINE HCL 50 MG/ML IJ SOLN
INTRAMUSCULAR | Status: AC
  Filled 2014-05-13: qty 1

## 2014-05-13 MED ORDER — SODIUM CHLORIDE 0.9 % IV SOLN
Freq: Once | INTRAVENOUS | Status: AC
  Administered 2014-05-13: 20:00:00 via INTRAVENOUS

## 2014-05-13 MED ORDER — FENTANYL CITRATE 0.05 MG/ML IJ SOLN
INTRAMUSCULAR | Status: DC | PRN
  Administered 2014-05-13: 25 ug via INTRAVENOUS

## 2014-05-13 MED ORDER — PANTOPRAZOLE SODIUM 40 MG PO TBEC
40.0000 mg | DELAYED_RELEASE_TABLET | Freq: Every day | ORAL | Status: DC
  Administered 2014-05-14 – 2014-05-15 (×2): 40 mg via ORAL
  Filled 2014-05-13 (×2): qty 1

## 2014-05-13 MED ORDER — SODIUM CHLORIDE 0.9 % IV SOLN
Freq: Once | INTRAVENOUS | Status: AC
  Administered 2014-05-13: 16:00:00 via INTRAVENOUS

## 2014-05-13 MED ORDER — MIDAZOLAM HCL 5 MG/ML IJ SOLN
INTRAMUSCULAR | Status: AC
  Filled 2014-05-13: qty 2

## 2014-05-13 NOTE — Progress Notes (Signed)
PT Cancellation Note  Patient Details Name: [name] MRN: [id number] DOB: [birth year]   Cancelled Treatment:    Reason Eval/Treat Not Completed: Patient not medically ready. Pt recently back from endoscopy and too lethargic to participate. Will attempt later today as schedule permits.   Ottilia Pippenger 05/13/2014, 12:08 PM Pager [phone]

## 2014-05-13 NOTE — Progress Notes (Signed)
[place] TEAM 1 - Stepdown/ICU TEAM Progress Note  [name] CHE:[id number] DOB: 1933/02/06 DOA: 05/10/2014 PCP: [name], MD  Admit HPI / Brief Narrative: [name] is a 80 y.o. female has a past medical history significant for recent CVA, atrial fibrillation, recent NSTEMI with outpatient evaluation by cardiology during the stress test done yesterday, combined systolic and diastolic heart failure (ejection fraction of 53-61%, grade 2 diastolic dysfunction on 4/43/1540), is being brought to the emergency room by her son shortness of breath and weakness and lethargy. She was found to be anemic with a hemoglobin of 4.7, with her baseline around 8. She is currently off anti coagulation secondary to Gi bleed in the past. She is currently on aspirin 81 mg . She was admitted to [facility] service and received a total of 3 units of PRBC and her H./H has improved to 7.5. An MRI was done on admission to evaluate for stroke , showed 2 acute areas of infarct. Neurology and gastroenterology were consulted. plan for colonoscopy in am.   HPI/Subjective: 8/1 lethargic but arousable follows all commands (just returned from EGD)   Assessment/Plan: Acute encephalopathy; Acute CVA  -Acute infarct in the right parietal lobe  -Q.4hr neuro check  -DC Norvasc will allow patient to have progressive HTN to ensure adequate cerebral perfusion in face of acute CVA  -PT/OT consult in a.m.  GI bleed/ symptomatic anemia/AV malformation:  -On admission patient's hemoglobin 4.7, after 2 units PRBC hemoglobin= 6.6  -7/30 transfuse one unit PRBC; Lasix IV 40 mg x1 prior to transfusion  - maintain hemoglobin> 8;  -Hemoglobin stable -S./P. colonoscopy/EGD see below for result  -8/1 Transfuse one unit PRBC  Combined diastolic and systolic CHF  -Control risk factors of HTN, diabetes, HLD  NSTEMI vs demand ischemia -Patient evaluated by cardiology 7/28 with nuclear stress test;Low risk stress nuclear study with an  inferior wall scar and mildly depressed LV systolic function -Currently chest pain-free/negative SOB -Patient has been typed and crossed, hemoglobin goal of> 8;  -8/1 Transfuse one unit PRBC  Stage 4 CKD  -Creatinine continues to improve,, continue to monitor    Uncontrolled DM: -7/30 hemoglobin A1c= 7 -Continue moderate SSI   Code Status: FULL Family Communication: no family present at time of exam Disposition Plan: Dependent on PT/OT evaluation   Consultants: Dr. Silvano Rusk (GI) Dr.Pramod Leonie Man (neurology stroke team)   Procedure/Significant Events: 7/29 CXR;Mild left base opacification which may represent atelectasis or infection.  -Moderate stable cardiomegaly. 7/29 CT head without contrast;Diffuse lucency lower right temporal lobe most likely beam hardening artifact. `Rt temporal lobe infarct cannot be completely excluded.  - Multiple periventricular punctate lucencies consistent with previously identified multiple ischemic foci  7/29 MRI brain without contrast;Two areas of acute infarct Rt parietal lobe.  -Moderate chronic microvascular ischemic change with diffuse atrophy.  7/31 nuclear stress test;Low risk stress nuclear study with an inferior wall scar and mildly depressed LV systolic function; GQQP=61%. 7/31 colonoscopy; normal colon 8/1 EGD; AV malformation was found in the proximal jejunum, remainder small bowel enteroscopy exam normal   Culture NA  Antibiotics: NA  DVT prophylaxis: SCD   Devices NA  LINES / TUBES:  7/30 22ga right forearm    Continuous Infusions:    Objective: VITAL SIGNS: Temp: 98.8 F (37.1 C) (08/01 1200) Temp src: Oral (08/01 1200) BP: 145/55 mmHg (08/01 1200) Pulse Rate: 62 (08/01 1200) SPO2; 90% on room air FIO2:   Intake/Output Summary (Last 24 hours) at 05/13/14 1446  Last data filed at 05/13/14 1100  Gross per 24 hour  Intake    680 ml  Output   1875 ml  Net  -1195 ml     Exam: General: Lethargic  (just returned from colonoscopy), arousable following commands answer questions appropriately No acute respiratory distress Lungs: Clear to auscultation bilaterally without wheezes or crackles Cardiovascular: Regular rate and rhythm without murmur gallop or rub normal S1 and S2 Abdomen: Nontender, nondistended, soft, bowel sounds positive, no rebound, no ascites, no appreciable mass Extremities: No significant cyanosis, clubbing, or edema bilateral lower extremities  Data Reviewed: Basic Metabolic Panel:  Recent Labs Lab 05/10/14 0735 05/11/14 0426 05/12/14 0338 05/13/14 0249  NA 138 139 138 136*  K 5.3 4.6 4.3 4.1  CL 103 110 108 109  CO2 14* 16* 18* 18*  GLUCOSE 218* 236* 80 96  BUN 56* 56* 47* 34*  CREATININE 2.98* 2.63* 2.11* 1.80*  CALCIUM 8.8 8.4 8.4 8.1*  MG  --  2.1  --  1.7   Liver Function Tests:  Recent Labs Lab 05/10/14 0735 05/11/14 0426 05/13/14 0249  AST 73* 76* 35  ALT 78* 89* 71*  ALKPHOS 185* 140* 120*  BILITOT 0.4 0.8 0.6  PROT 6.8 5.7* 5.6*  ALBUMIN 2.7* 2.2* 2.1*   No results found for this basename: LIPASE, AMYLASE,  in the last 168 hours  Recent Labs Lab 05/10/14 0738  AMMONIA 29   CBC:  Recent Labs Lab 05/10/14 0832 05/10/14 2253 05/11/14 0426 05/11/14 1828 05/12/14 0338 05/13/14 0249  WBC 10.5 9.6 11.0*  --  12.2* 9.8  NEUTROABS  --   --   --   --   --  7.6  HGB 4.7* 6.6* 7.5* 8.0* 8.4* 7.5*  HCT 15.8* 20.6* 22.9* 25.2* 26.1* 23.6*  MCV 81.9 81.7 79.8  --  79.8 80.3  PLT 442* 318 368  --  366 321   Cardiac Enzymes:  Recent Labs Lab 05/10/14 1023 05/10/14 1650 05/10/14 2203  TROPONINI 0.57* 0.46* 0.39*   BNP (last 3 results)  Recent Labs  12/03/13 1808 12/05/13 0448 05/10/14 0736  PROBNP 7767.0* 6023.0* 14464.0*   CBG:  Recent Labs Lab 05/12/14 2133 05/12/14 2339 05/13/14 0353 05/13/14 0744 05/13/14 1127  GLUCAP 215* 146* 97 104* 105*    Recent Results (from the past 240 hour(s))  MRSA PCR SCREENING      Status: None   Collection Time    05/10/14  3:52 PM      Result Value Ref Range Status   MRSA by PCR NEGATIVE  NEGATIVE Final   Comment:            The GeneXpert MRSA Assay (FDA     approved for NASAL specimens     only), is one component of a     comprehensive MRSA colonization     surveillance program. It is not     intended to diagnose MRSA     infection nor to guide or     monitor treatment for     MRSA infections.     Studies:  Recent x-ray studies have been reviewed in detail by the Attending Physician  Scheduled Meds:  Scheduled Meds: . sodium chloride   Intravenous Once  . atorvastatin  40 mg Oral q1800  . insulin aspart  0-15 Units Subcutaneous 6 times per day  . insulin glargine  8 Units Subcutaneous QHS  . isosorbide mononitrate  60 mg Oral Daily  . lisinopril  5 mg  Oral Daily  . magnesium oxide  400 mg Oral BID  . metoprolol succinate  25 mg Oral Daily  . [START ON 05/14/2014] pantoprazole  40 mg Oral QAC breakfast  . pneumococcal 23 valent vaccine  0.5 mL Intramuscular Tomorrow-1000  . sodium chloride  3 mL Intravenous Q12H    Time spent on care of this patient: 40 mins   Allie Bossier , MD   [facility] Office  [phone] Pager [phone]  On-Call/Text Page:      [email or web address]      password [email or web address]  If 7PM-7AM, please contact night-coverage [email or web address] Password [email or web address] 05/13/2014, 2:46 PM   LOS: 3 days

## 2014-05-13 NOTE — Op Note (Signed)
[facility] [place], [address]   OPERATIVE PROCEDURE REPORT  PATIENT: [name], [name]  MR#: [id number] BIRTHDATE: [birth year] , 80  yrs. old GENDER: Female ENDOSCOPIST: [name], MD, [facility] PROCEDURE DATE: 05/13/2014 PROCEDURE:   Small bowel enteroscopy with ablation therapy ASA CLASS:   Class III INDICATIONS:1.  iron deficiency anemia. melena MEDICATIONS: Fentanyl 25 mcg IV and Versed 4 mg IV TOPICAL ANESTHETIC:   Cetacaine Spray  DESCRIPTION OF PROCEDURE:   After the risks benefits and alternatives of the procedure were thoroughly explained, informed consent was obtained.  The [id number] ([id number])  endoscope was introduced through the mouth  and advanced to the proximal jejunum jejunum , limited by Without limitations.   The instrument was slowly withdrawn as the mucosa was fully examined.    An a.v.  malformation was found in the proximal jejunum.   The remainder of the small bowel enteroscopy exam was otherwise normal. Retroflexed views revealed no abnormalities.    The scope was then withdrawn from the patient and the procedure terminated.  COMPLICATIONS: There were no complications. ENDOSCOPIC IMPRESSION: 1.   An a.v.  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   The remainder of the small bowel enteroscopy exam was otherwise normal  RECOMMENDATIONS: Reduce PPI to qd - no gastritis now Repeat Feraheme x 1 when appropriate oral iron regular CBC hard to know what is best re: anticoagulation and anti-PLT - would avoid anticoags but can consider 81 mg ASA given overall hx she probably has more AVM's that cannot be reached and will need support of Hgb through PCP and/or hematology - she may also benefit from eryhtropoietin as I believe there is a component of chronic disease anemia F/u GI prn   eSigned:  [name], MD, [facility] 05/13/2014 11:14 AM  CC: Iona Beard, MD

## 2014-05-14 DIAGNOSIS — I214 Non-ST elevation (NSTEMI) myocardial infarction: Secondary | ICD-10-CM | POA: Diagnosis not present

## 2014-05-14 LAB — COMPREHENSIVE METABOLIC PANEL
ALK PHOS: 114 U/L (ref 39–117)
ALT: 66 U/L — AB (ref 0–35)
AST: 33 U/L (ref 0–37)
Albumin: 2.1 g/dL — ABNORMAL LOW (ref 3.5–5.2)
Anion gap: 11 (ref 5–15)
BUN: 27 mg/dL — ABNORMAL HIGH (ref 6–23)
CO2: 18 meq/L — AB (ref 19–32)
Calcium: 8 mg/dL — ABNORMAL LOW (ref 8.4–10.5)
Chloride: 106 mEq/L (ref 96–112)
Creatinine, Ser: 1.75 mg/dL — ABNORMAL HIGH (ref 0.50–1.10)
GFR calc Af Amer: 31 mL/min — ABNORMAL LOW (ref >90)
GFR calc non Af Amer: 27 mL/min — ABNORMAL LOW (ref >90)
Glucose, Bld: 100 mg/dL — ABNORMAL HIGH (ref 70–99)
Potassium: 4.3 mEq/L (ref 3.7–5.3)
SODIUM: 135 meq/L — AB (ref 137–147)
Total Bilirubin: 1.1 mg/dL (ref 0.3–1.2)
Total Protein: 5.6 g/dL — ABNORMAL LOW (ref 6.0–8.3)

## 2014-05-14 LAB — CBC WITH DIFFERENTIAL/PLATELET
Basophils Absolute: 0 10*3/uL (ref 0.0–0.1)
Basophils Relative: 0 % (ref 0–1)
EOS PCT: 1 % (ref 0–5)
Eosinophils Absolute: 0.1 10*3/uL (ref 0.0–0.7)
HCT: 28.1 % — ABNORMAL LOW (ref 36.0–46.0)
HEMOGLOBIN: 9 g/dL — AB (ref 12.0–15.0)
LYMPHS ABS: 1.1 10*3/uL (ref 0.7–4.0)
LYMPHS PCT: 11 % — AB (ref 12–46)
MCH: 26.5 pg (ref 26.0–34.0)
MCHC: 32 g/dL (ref 30.0–36.0)
MCV: 82.9 fL (ref 78.0–100.0)
Monocytes Absolute: 1 10*3/uL (ref 0.1–1.0)
Monocytes Relative: 10 % (ref 3–12)
Neutro Abs: 7.5 10*3/uL (ref 1.7–7.7)
Neutrophils Relative %: 78 % — ABNORMAL HIGH (ref 43–77)
PLATELETS: 307 10*3/uL (ref 150–400)
RBC: 3.39 MIL/uL — AB (ref 3.87–5.11)
RDW: 16.4 % — ABNORMAL HIGH (ref 11.5–15.5)
WBC: 9.7 10*3/uL (ref 4.0–10.5)

## 2014-05-14 LAB — TYPE AND SCREEN
ABO/RH(D): B POS
ANTIBODY SCREEN: NEGATIVE
Unit division: 0

## 2014-05-14 LAB — GLUCOSE, CAPILLARY
GLUCOSE-CAPILLARY: 166 mg/dL — AB (ref 70–99)
GLUCOSE-CAPILLARY: 214 mg/dL — AB (ref 70–99)
GLUCOSE-CAPILLARY: 182 mg/dL — AB (ref 70–99)
Glucose-Capillary: 125 mg/dL — ABNORMAL HIGH (ref 70–99)
Glucose-Capillary: 172 mg/dL — ABNORMAL HIGH (ref 70–99)
Glucose-Capillary: 190 mg/dL — ABNORMAL HIGH (ref 70–99)
Glucose-Capillary: 87 mg/dL (ref 70–99)

## 2014-05-14 LAB — MAGNESIUM: Magnesium: 1.8 mg/dL (ref 1.5–2.5)

## 2014-05-14 MED ORDER — WHITE PETROLATUM GEL
Status: AC
Start: 2014-05-14 — End: 2014-05-14
  Administered 2014-05-14: 13:00:00
  Filled 2014-05-14: qty 5

## 2014-05-14 MED ORDER — INSULIN ASPART 100 UNIT/ML ~~LOC~~ SOLN
0.0000 [IU] | Freq: Three times a day (TID) | SUBCUTANEOUS | Status: DC
Start: 2014-05-14 — End: 2014-05-15
  Administered 2014-05-14: 5 [IU] via SUBCUTANEOUS
  Administered 2014-05-14 – 2014-05-15 (×3): 3 [IU] via SUBCUTANEOUS

## 2014-05-14 NOTE — Evaluation (Signed)
Physical Therapy Evaluation Patient Details Name: [name] MRN: [id number] DOB: [birth year] Today's Date: 05/14/2014   History of Present Illness  [name] is a 80 y.o. female has a past medical history significant for recent CVA, atrial fibrillation, recent NSTEMI with outpatient evaluation by cardiology during the stress test done yesterday, combined systolic and diastolic heart failure (ejection fraction of 01-77%, grade 2 diastolic dysfunction on 9/39/0300), is being brought to the emergency room 7/29 by her son shortness of breath and weakness.   Clinical Impression  Pt presenting with R sided weakness, impaired memory, delayed processing and increased assist for transfers and amb. Pt was functioning at supervision level PTA. Pt lives with son and has a personal caregiver so 24/7 assist/supervision upon d/c.    Follow Up Recommendations CIR    Equipment Recommendations  None recommended by PT    Recommendations for Other Services Rehab consult     Precautions / Restrictions Precautions Precautions: Fall Restrictions Weight Bearing Restrictions: No      Mobility  Bed Mobility Overal bed mobility: Needs Assistance Bed Mobility: Supine to Sit     Supine to sit: Min assist     General bed mobility comments: pt initiated transfer and brough self to EOB  Transfers Overall transfer level: Needs assistance Equipment used: Rolling walker (2 wheeled) Transfers: Sit to/from Stand Sit to Stand: Min assist         General transfer comment: v/c's for safe hand placement, minA to initiate anterior transfer  Ambulation/Gait Ambulation/Gait assistance: Mod assist Ambulation Distance (Feet): 20 Feet Assistive device: Rolling walker (2 wheeled) Gait Pattern/deviations: Step-to pattern;Decreased step length - right;Decreased stance time - right;Shuffle;Narrow base of support Gait velocity: slow   General Gait Details: pt with + tremors/shaking, son reports this to be  at baseline. minA for walker mangement  Stairs            Wheelchair Mobility    Modified Rankin (Stroke Patients Only)       Balance Overall balance assessment: Needs assistance Sitting-balance support: Feet supported;No upper extremity supported Sitting balance-Leahy Scale: Fair     Standing balance support: Bilateral upper extremity supported Standing balance-Leahy Scale: Poor Standing balance comment: requires RW for safe standing. pt stood x 2 min for hygiene s/p stool incontinence                             Pertinent Vitals/Pain Denies pain    Home Living Family/patient expects to be discharged to:: Private residence Living Arrangements: Children Available Help at Discharge: Family;Personal care attendant;Available 24 hours/day Type of Home: House Home Access: Stairs to enter Entrance Stairs-Rails: None Entrance Stairs-Number of Steps: 2 Home Layout: One level Home Equipment: Walker - 2 wheels;Shower seat Additional Comments: son reports pt to have caregiver that comes M-F 9-1 to assist with dressing/bathing.    Prior Function Level of Independence: Needs assistance   Gait / Transfers Assistance Needed: supervision with RW  ADL's / Homemaking Assistance Needed: son reports pt can bath self however also reports there is a caregiver that comes to assist with that as well        Hand Dominance   Dominant Hand: Right    Extremity/Trunk Assessment   Upper Extremity Assessment: RUE deficits/detail RUE Deficits / Details: pt with noted R UE drift when bilat UE raised to shld 90 deg flex, grossly 3+/5         Lower Extremity Assessment: RLE  deficits/detail RLE Deficits / Details: grossly 4-/5    Cervical / Trunk Assessment: Normal  Communication   Communication: HOH  Cognition Arousal/Alertness: Awake/alert Behavior During Therapy: WFL for tasks assessed/performed Overall Cognitive Status: Impaired/Different from baseline Area of  Impairment: Orientation;Attention;Memory;Following commands;Safety/judgement;Awareness;Problem solving Orientation Level: Disoriented to;Time;Situation (required cuing for date) Current Attention Level: Sustained Memory: Decreased short-term memory Following Commands: Follows one step commands with increased time   Awareness: Intellectual Problem Solving: Slow processing;Difficulty sequencing;Requires verbal cues;Requires tactile cues      General Comments      Exercises        Assessment/Plan    PT Assessment Patient needs continued PT services  PT Diagnosis Difficulty walking   PT Problem List Decreased strength;Decreased activity tolerance;Decreased balance;Decreased mobility;Decreased coordination;Decreased cognition  PT Treatment Interventions DME instruction;Gait training;Stair training;Functional mobility training;Therapeutic activities;Therapeutic exercise;Balance training;Neuromuscular re-education;Cognitive remediation   PT Goals (Current goals can be found in the Care Plan section) Acute Rehab PT Goals PT Goal Formulation: With patient/family Time For Goal Achievement: 05/28/14 Potential to Achieve Goals: Good    Frequency Min 4X/week   Barriers to discharge        Co-evaluation               End of Session Equipment Utilized During Treatment: Gait belt Activity Tolerance: Patient tolerated treatment well Patient left: in chair;with call bell/phone within reach;with family/visitor present Nurse Communication: Mobility status         Time: 6286-3817 PT Time Calculation (min): 21 min   Charges:   PT Evaluation $Initial PT Evaluation Tier I: 1 Procedure PT Treatments $Gait Training: 8-22 mins   PT G CodesKingsley Callander 05/14/2014, 12:41 PM  Kittie Plater, PT, DPT Pager #: [phone] Office #: [phone]

## 2014-05-14 NOTE — Progress Notes (Signed)
[place] TEAM 1 - Stepdown/ICU TEAM Progress Note  [name] DVV:[id number] DOB: 03/12/1933 DOA: 05/10/2014 PCP: [name], MD  Admit HPI / Brief Narrative: [name] is a 80 y.o. female has a past medical history significant for recent CVA, atrial fibrillation, recent NSTEMI with outpatient evaluation by cardiology during the stress test done yesterday, combined systolic and diastolic heart failure (ejection fraction of 62-69%, grade 2 diastolic dysfunction on 4/85/4627), is being brought to the emergency room by her son shortness of breath and weakness and lethargy. She was found to be anemic with a hemoglobin of 4.7, with her baseline around 8. She is currently off anti coagulation secondary to Gi bleed in the past. She is currently on aspirin 81 mg . She was admitted to [facility] service and received a total of 3 units of PRBC and her H./H has improved to 7.5. An MRI was done on admission to evaluate for stroke , showed 2 acute areas of infarct. Neurology and gastroenterology were consulted. plan for colonoscopy in am.   HPI/Subjective: 8/2 sitting in chair comfortably eating and interacting with her family. No complaints    Assessment/Plan: Acute encephalopathy; Acute CVA  -Acute infarct in the right parietal lobe  -Q.4hr neuro check  -DC Norvasc will allow patient to have progressive HTN to ensure adequate cerebral perfusion in face of acute CVA  -PT; recommends CIR, consult to rehabilitation placed, awaiting recommendations -OT; consulted awaiting recommendation  GI bleed/ symptomatic anemia/AV malformation:  -On admission patient's hemoglobin 4.7, after 2 units PRBC hemoglobin= 6.6  -7/30 transfuse one unit PRBC; Lasix IV 40 mg x1 prior to transfusion  - maintain hemoglobin> 8;  -Hemoglobin stable -S./P. colonoscopy/EGD see below for result  -8/1 Transfuse one unit PRBC -Hemoglobin stable  Combined diastolic and systolic CHF  -Control risk factors of HTN, diabetes,  HLD  NSTEMI vs demand ischemia -Patient evaluated by cardiology 7/28 with nuclear stress test;Low risk stress nuclear study with an inferior wall scar and mildly depressed LV systolic function -Currently chest pain-free/negative SOB -Patient has been typed and crossed, hemoglobin goal of> 8;  -8/1 Transfuse one unit PRBC -Negative chest pain asymptomatic  Stage 4 CKD  -Creatinine continues to improve,, continue to monitor    Uncontrolled DM: -7/30 hemoglobin A1c= 7 -Continue moderate SSI   Code Status: FULL Family Communication: family present at time of exam Disposition Plan: Dependent on PT/OT evaluation   Consultants: Dr. Silvano Rusk (GI) Dr.Pramod Leonie Man (neurology stroke team)   Procedure/Significant Events: 7/29 CXR;Mild left base opacification which may represent atelectasis or infection.  -Moderate stable cardiomegaly. 7/29 CT head without contrast;Diffuse lucency lower right temporal lobe most likely beam hardening artifact. `Rt temporal lobe infarct cannot be completely excluded.  - Multiple periventricular punctate lucencies consistent with previously identified multiple ischemic foci  7/29 MRI brain without contrast;Two areas of acute infarct Rt parietal lobe.  -Moderate chronic microvascular ischemic change with diffuse atrophy.  7/31 nuclear stress test;Low risk stress nuclear study with an inferior wall scar and mildly depressed LV systolic function; OJJK=09%. 7/31 colonoscopy; normal colon 8/1 EGD; AV malformation was found in the proximal jejunum, remainder small bowel enteroscopy exam normal   Culture NA  Antibiotics: NA  DVT prophylaxis: SCD   Devices NA  LINES / TUBES:  7/30 22ga right forearm    Continuous Infusions:    Objective: VITAL SIGNS: Temp: 98.5 F (36.9 C) (08/02 1158) Temp src: Oral (08/02 1158) BP: 155/46 mmHg (08/02 0900) Pulse Rate: 69 (08/02  0900) SPO2; 97% on room air FIO2:   Intake/Output Summary (Last 24  hours) at 05/14/14 1254 Last data filed at 05/14/14 1159  Gross per 24 hour  Intake    475 ml  Output   1100 ml  Net   -625 ml     Exam: General: A./O. x4, sitting comfortably in chair, answers questions appropriately No acute respiratory distress Lungs: Clear to auscultation bilaterally without wheezes or crackles Cardiovascular: Regular rate and rhythm without murmur gallop or rub normal S1 and S2 Abdomen: Nontender, nondistended, soft, bowel sounds positive, no rebound, no ascites, no appreciable mass Extremities: No significant cyanosis, clubbing, or edema bilateral lower extremities Neurologic: Pupils equal round reactive to light and accommodation, cranial nerves II through XII intact, tongue/uvula midline, bilateral upper extremity strength 5/5, bilateral lower extremities strength 3/5. Did not ambulate patient  Data Reviewed: Basic Metabolic Panel:  Recent Labs Lab 05/10/14 0735 05/11/14 0426 05/12/14 0338 05/13/14 0249 05/14/14 0530  NA 138 139 138 136* 135*  K 5.3 4.6 4.3 4.1 4.3  CL 103 110 108 109 106  CO2 14* 16* 18* 18* 18*  GLUCOSE 218* 236* 80 96 100*  BUN 56* 56* 47* 34* 27*  CREATININE 2.98* 2.63* 2.11* 1.80* 1.75*  CALCIUM 8.8 8.4 8.4 8.1* 8.0*  MG  --  2.1  --  1.7 1.8   Liver Function Tests:  Recent Labs Lab 05/10/14 0735 05/11/14 0426 05/13/14 0249 05/14/14 0530  AST 73* 76* 35 33  ALT 78* 89* 71* 66*  ALKPHOS 185* 140* 120* 114  BILITOT 0.4 0.8 0.6 1.1  PROT 6.8 5.7* 5.6* 5.6*  ALBUMIN 2.7* 2.2* 2.1* 2.1*   No results found for this basename: LIPASE, AMYLASE,  in the last 168 hours  Recent Labs Lab 05/10/14 0738  AMMONIA 29   CBC:  Recent Labs Lab 05/10/14 2253 05/11/14 0426 05/11/14 1828 05/12/14 0338 05/13/14 0249 05/13/14 1450 05/14/14 0530  WBC 9.6 11.0*  --  12.2* 9.8  --  9.7  NEUTROABS  --   --   --   --  7.6  --  7.5  HGB 6.6* 7.5* 8.0* 8.4* 7.5* 7.7* 9.0*  HCT 20.6* 22.9* 25.2* 26.1* 23.6* 24.2* 28.1*  MCV 81.7  79.8  --  79.8 80.3  --  82.9  PLT 318 368  --  366 321  --  307   Cardiac Enzymes:  Recent Labs Lab 05/10/14 1023 05/10/14 1650 05/10/14 2203  TROPONINI 0.57* 0.46* 0.39*   BNP (last 3 results)  Recent Labs  12/03/13 1808 12/05/13 0448 05/10/14 0736  PROBNP 7767.0* 6023.0* 14464.0*   CBG:  Recent Labs Lab 05/13/14 1925 05/13/14 2349 05/14/14 0343 05/14/14 0815 05/14/14 1150  GLUCAP 145* 190* 125* 87 166*    Recent Results (from the past 240 hour(s))  MRSA PCR SCREENING     Status: None   Collection Time    05/10/14  3:52 PM      Result Value Ref Range Status   MRSA by PCR NEGATIVE  NEGATIVE Final   Comment:            The GeneXpert MRSA Assay (FDA     approved for NASAL specimens     only), is one component of a     comprehensive MRSA colonization     surveillance program. It is not     intended to diagnose MRSA     infection nor to guide or     monitor treatment for  MRSA infections.     Studies:  Recent x-ray studies have been reviewed in detail by the Attending Physician  Scheduled Meds:  Scheduled Meds: . atorvastatin  40 mg Oral q1800  . insulin aspart  0-15 Units Subcutaneous TID WC & HS  . insulin glargine  8 Units Subcutaneous QHS  . isosorbide mononitrate  60 mg Oral Daily  . lisinopril  5 mg Oral Daily  . magnesium oxide  400 mg Oral BID  . metoprolol succinate  25 mg Oral Daily  . pantoprazole  40 mg Oral QAC breakfast  . white petrolatum        Time spent on care of this patient: 40 mins   [name] , MD   [facility] Office  [phone] Pager [phone]  On-Call/Text Page:      [email or web address]      password [email or web address]  If 7PM-7AM, please contact night-coverage [email or web address] Password [email or web address] 05/14/2014, 12:54 PM   LOS: 4 days

## 2014-05-15 ENCOUNTER — Encounter (HOSPITAL_COMMUNITY): Payer: Self-pay | Admitting: Internal Medicine

## 2014-05-15 ENCOUNTER — Inpatient Hospital Stay (HOSPITAL_COMMUNITY)
Admission: RE | Admit: 2014-05-15 | Discharge: 2014-05-25 | DRG: 945 | Disposition: A | Discharge status: Home Health | Payer: Medicare Other | Source: Intra-hospital | Attending: Physical Medicine & Rehabilitation | Admitting: Physical Medicine & Rehabilitation

## 2014-05-15 DIAGNOSIS — Z961 Presence of intraocular lens: Secondary | ICD-10-CM | POA: Diagnosis not present

## 2014-05-15 DIAGNOSIS — R29898 Other symptoms and signs involving the musculoskeletal system: Secondary | ICD-10-CM | POA: Diagnosis present

## 2014-05-15 DIAGNOSIS — I2510 Atherosclerotic heart disease of native coronary artery without angina pectoris: Secondary | ICD-10-CM

## 2014-05-15 DIAGNOSIS — I6330 Cerebral infarction due to thrombosis of unspecified cerebral artery: Secondary | ICD-10-CM | POA: Diagnosis not present

## 2014-05-15 DIAGNOSIS — Z5189 Encounter for other specified aftercare: Secondary | ICD-10-CM | POA: Diagnosis not present

## 2014-05-15 DIAGNOSIS — I252 Old myocardial infarction: Secondary | ICD-10-CM

## 2014-05-15 DIAGNOSIS — G4733 Obstructive sleep apnea (adult) (pediatric): Secondary | ICD-10-CM | POA: Diagnosis present

## 2014-05-15 DIAGNOSIS — J45909 Unspecified asthma, uncomplicated: Secondary | ICD-10-CM | POA: Diagnosis present

## 2014-05-15 DIAGNOSIS — N189 Chronic kidney disease, unspecified: Secondary | ICD-10-CM | POA: Diagnosis present

## 2014-05-15 DIAGNOSIS — R4701 Aphasia: Secondary | ICD-10-CM | POA: Diagnosis present

## 2014-05-15 DIAGNOSIS — I5030 Unspecified diastolic (congestive) heart failure: Secondary | ICD-10-CM | POA: Diagnosis present

## 2014-05-15 DIAGNOSIS — N390 Urinary tract infection, site not specified: Secondary | ICD-10-CM | POA: Diagnosis not present

## 2014-05-15 DIAGNOSIS — I2789 Other specified pulmonary heart diseases: Secondary | ICD-10-CM | POA: Diagnosis present

## 2014-05-15 DIAGNOSIS — I129 Hypertensive chronic kidney disease with stage 1 through stage 4 chronic kidney disease, or unspecified chronic kidney disease: Secondary | ICD-10-CM | POA: Diagnosis present

## 2014-05-15 DIAGNOSIS — Z8542 Personal history of malignant neoplasm of other parts of uterus: Secondary | ICD-10-CM | POA: Diagnosis not present

## 2014-05-15 DIAGNOSIS — I639 Cerebral infarction, unspecified: Secondary | ICD-10-CM

## 2014-05-15 DIAGNOSIS — E1165 Type 2 diabetes mellitus with hyperglycemia: Secondary | ICD-10-CM

## 2014-05-15 DIAGNOSIS — Z9851 Tubal ligation status: Secondary | ICD-10-CM | POA: Diagnosis not present

## 2014-05-15 DIAGNOSIS — Z79899 Other long term (current) drug therapy: Secondary | ICD-10-CM

## 2014-05-15 DIAGNOSIS — I5031 Acute diastolic (congestive) heart failure: Secondary | ICD-10-CM

## 2014-05-15 DIAGNOSIS — E785 Hyperlipidemia, unspecified: Secondary | ICD-10-CM | POA: Diagnosis present

## 2014-05-15 DIAGNOSIS — D509 Iron deficiency anemia, unspecified: Secondary | ICD-10-CM

## 2014-05-15 DIAGNOSIS — K5521 Angiodysplasia of colon with hemorrhage: Secondary | ICD-10-CM | POA: Diagnosis present

## 2014-05-15 DIAGNOSIS — K3184 Gastroparesis: Secondary | ICD-10-CM | POA: Diagnosis present

## 2014-05-15 DIAGNOSIS — M545 Low back pain, unspecified: Secondary | ICD-10-CM | POA: Diagnosis present

## 2014-05-15 DIAGNOSIS — I6350 Cerebral infarction due to unspecified occlusion or stenosis of unspecified cerebral artery: Secondary | ICD-10-CM | POA: Diagnosis present

## 2014-05-15 DIAGNOSIS — E1149 Type 2 diabetes mellitus with other diabetic neurological complication: Secondary | ICD-10-CM | POA: Diagnosis present

## 2014-05-15 DIAGNOSIS — K2100 Gastro-esophageal reflux disease with esophagitis, without bleeding: Secondary | ICD-10-CM

## 2014-05-15 DIAGNOSIS — Z9849 Cataract extraction status, unspecified eye: Secondary | ICD-10-CM

## 2014-05-15 DIAGNOSIS — F0390 Unspecified dementia without behavioral disturbance: Secondary | ICD-10-CM | POA: Diagnosis present

## 2014-05-15 DIAGNOSIS — D508 Other iron deficiency anemias: Secondary | ICD-10-CM

## 2014-05-15 DIAGNOSIS — Z8249 Family history of ischemic heart disease and other diseases of the circulatory system: Secondary | ICD-10-CM | POA: Diagnosis not present

## 2014-05-15 DIAGNOSIS — A498 Other bacterial infections of unspecified site: Secondary | ICD-10-CM | POA: Diagnosis not present

## 2014-05-15 DIAGNOSIS — Z8673 Personal history of transient ischemic attack (TIA), and cerebral infarction without residual deficits: Secondary | ICD-10-CM

## 2014-05-15 DIAGNOSIS — I509 Heart failure, unspecified: Secondary | ICD-10-CM | POA: Diagnosis present

## 2014-05-15 DIAGNOSIS — I498 Other specified cardiac arrhythmias: Secondary | ICD-10-CM | POA: Diagnosis not present

## 2014-05-15 DIAGNOSIS — I5032 Chronic diastolic (congestive) heart failure: Secondary | ICD-10-CM

## 2014-05-15 DIAGNOSIS — E1142 Type 2 diabetes mellitus with diabetic polyneuropathy: Secondary | ICD-10-CM | POA: Diagnosis present

## 2014-05-15 DIAGNOSIS — Z87891 Personal history of nicotine dependence: Secondary | ICD-10-CM | POA: Diagnosis not present

## 2014-05-15 DIAGNOSIS — G8929 Other chronic pain: Secondary | ICD-10-CM | POA: Diagnosis present

## 2014-05-15 DIAGNOSIS — Z800 Family history of malignant neoplasm of digestive organs: Secondary | ICD-10-CM

## 2014-05-15 DIAGNOSIS — M810 Age-related osteoporosis without current pathological fracture: Secondary | ICD-10-CM | POA: Diagnosis present

## 2014-05-15 DIAGNOSIS — K2961 Other gastritis with bleeding: Secondary | ICD-10-CM

## 2014-05-15 DIAGNOSIS — IMO0001 Reserved for inherently not codable concepts without codable children: Secondary | ICD-10-CM | POA: Diagnosis not present

## 2014-05-15 DIAGNOSIS — Z7982 Long term (current) use of aspirin: Secondary | ICD-10-CM

## 2014-05-15 DIAGNOSIS — K219 Gastro-esophageal reflux disease without esophagitis: Secondary | ICD-10-CM | POA: Diagnosis present

## 2014-05-15 DIAGNOSIS — I4891 Unspecified atrial fibrillation: Secondary | ICD-10-CM

## 2014-05-15 DIAGNOSIS — I10 Essential (primary) hypertension: Secondary | ICD-10-CM

## 2014-05-15 DIAGNOSIS — I69993 Ataxia following unspecified cerebrovascular disease: Secondary | ICD-10-CM

## 2014-05-15 LAB — CBC WITH DIFFERENTIAL/PLATELET
Basophils Absolute: 0 10*3/uL (ref 0.0–0.1)
Basophils Relative: 0 % (ref 0–1)
EOS ABS: 0.1 10*3/uL (ref 0.0–0.7)
EOS PCT: 1 % (ref 0–5)
HCT: 28.1 % — ABNORMAL LOW (ref 36.0–46.0)
HEMOGLOBIN: 9.1 g/dL — AB (ref 12.0–15.0)
LYMPHS ABS: 1.2 10*3/uL (ref 0.7–4.0)
LYMPHS PCT: 10 % — AB (ref 12–46)
MCH: 26.4 pg (ref 26.0–34.0)
MCHC: 32.4 g/dL (ref 30.0–36.0)
MCV: 81.4 fL (ref 78.0–100.0)
MONO ABS: 0.8 10*3/uL (ref 0.1–1.0)
Monocytes Relative: 6 % (ref 3–12)
Neutro Abs: 10 10*3/uL — ABNORMAL HIGH (ref 1.7–7.7)
Neutrophils Relative %: 83 % — ABNORMAL HIGH (ref 43–77)
Platelets: 339 10*3/uL (ref 150–400)
RBC: 3.45 MIL/uL — ABNORMAL LOW (ref 3.87–5.11)
RDW: 16.9 % — ABNORMAL HIGH (ref 11.5–15.5)
WBC: 12.1 10*3/uL — ABNORMAL HIGH (ref 4.0–10.5)

## 2014-05-15 LAB — COMPREHENSIVE METABOLIC PANEL
ALT: 58 U/L — ABNORMAL HIGH (ref 0–35)
ANION GAP: 13 (ref 5–15)
AST: 29 U/L (ref 0–37)
Albumin: 2.1 g/dL — ABNORMAL LOW (ref 3.5–5.2)
Alkaline Phosphatase: 116 U/L (ref 39–117)
BUN: 22 mg/dL (ref 6–23)
CALCIUM: 8.1 mg/dL — AB (ref 8.4–10.5)
CO2: 17 meq/L — AB (ref 19–32)
CREATININE: 1.48 mg/dL — AB (ref 0.50–1.10)
Chloride: 104 mEq/L (ref 96–112)
GFR calc Af Amer: 38 mL/min — ABNORMAL LOW (ref >90)
GFR, EST NON AFRICAN AMERICAN: 33 mL/min — AB (ref >90)
GLUCOSE: 169 mg/dL — AB (ref 70–99)
Potassium: 4.5 mEq/L (ref 3.7–5.3)
Sodium: 134 mEq/L — ABNORMAL LOW (ref 137–147)
TOTAL PROTEIN: 6.1 g/dL (ref 6.0–8.3)
Total Bilirubin: 0.7 mg/dL (ref 0.3–1.2)

## 2014-05-15 LAB — GLUCOSE, CAPILLARY
GLUCOSE-CAPILLARY: 187 mg/dL — AB (ref 70–99)
GLUCOSE-CAPILLARY: 229 mg/dL — AB (ref 70–99)
Glucose-Capillary: 193 mg/dL — ABNORMAL HIGH (ref 70–99)
Glucose-Capillary: 204 mg/dL — ABNORMAL HIGH (ref 70–99)

## 2014-05-15 LAB — RETICULOCYTES
RBC.: 3.64 MIL/uL — AB (ref 3.87–5.11)
RETIC COUNT ABSOLUTE: 123.8 10*3/uL (ref 19.0–186.0)
Retic Ct Pct: 3.4 % — ABNORMAL HIGH (ref 0.4–3.1)

## 2014-05-15 LAB — MAGNESIUM: Magnesium: 1.8 mg/dL (ref 1.5–2.5)

## 2014-05-15 MED ORDER — ATORVASTATIN CALCIUM 40 MG PO TABS
40.0000 mg | ORAL_TABLET | Freq: Every day | ORAL | Status: DC
  Administered 2014-05-15 – 2014-05-24 (×10): 40 mg via ORAL
  Filled 2014-05-15 (×11): qty 1

## 2014-05-15 MED ORDER — ASPIRIN 81 MG PO CHEW
81.0000 mg | CHEWABLE_TABLET | Freq: Every day | ORAL | Status: DC
  Administered 2014-05-15 – 2014-05-25 (×11): 81 mg via ORAL
  Filled 2014-05-15 (×11): qty 1

## 2014-05-15 MED ORDER — ONDANSETRON HCL 4 MG PO TABS: 4.0000 mg | ORAL_TABLET | Freq: Four times a day (QID) | ORAL | Status: DC | PRN

## 2014-05-15 MED ORDER — ISOSORBIDE MONONITRATE ER 60 MG PO TB24
60.0000 mg | ORAL_TABLET | Freq: Every day | ORAL | Status: DC
  Administered 2014-05-16 – 2014-05-25 (×10): 60 mg via ORAL
  Filled 2014-05-15 (×11): qty 1

## 2014-05-15 MED ORDER — SORBITOL 70 % SOLN
30.0000 mL | Freq: Every day | Status: DC | PRN
  Administered 2014-05-18: 30 mL via ORAL
  Filled 2014-05-15: qty 30

## 2014-05-15 MED ORDER — INSULIN ASPART 100 UNIT/ML ~~LOC~~ SOLN
0.0000 [IU] | Freq: Three times a day (TID) | SUBCUTANEOUS | Status: DC
  Administered 2014-05-15 – 2014-05-16 (×4): 5 [IU] via SUBCUTANEOUS
  Administered 2014-05-16 – 2014-05-17 (×2): 3 [IU] via SUBCUTANEOUS
  Administered 2014-05-17: 2 [IU] via SUBCUTANEOUS
  Administered 2014-05-17: 3 [IU] via SUBCUTANEOUS
  Administered 2014-05-17: 5 [IU] via SUBCUTANEOUS
  Administered 2014-05-18: 3 [IU] via SUBCUTANEOUS
  Administered 2014-05-18 (×2): 5 [IU] via SUBCUTANEOUS
  Administered 2014-05-19: 2 [IU] via SUBCUTANEOUS
  Administered 2014-05-19: 3 [IU] via SUBCUTANEOUS
  Administered 2014-05-19: 2 [IU] via SUBCUTANEOUS
  Administered 2014-05-19: 5 [IU] via SUBCUTANEOUS
  Administered 2014-05-20: 3 [IU] via SUBCUTANEOUS
  Administered 2014-05-20: 2 [IU] via SUBCUTANEOUS
  Administered 2014-05-20: 5 [IU] via SUBCUTANEOUS
  Administered 2014-05-21: 3 [IU] via SUBCUTANEOUS
  Administered 2014-05-21: 5 [IU] via SUBCUTANEOUS
  Administered 2014-05-21: 3 [IU] via SUBCUTANEOUS
  Administered 2014-05-22: 5 [IU] via SUBCUTANEOUS
  Administered 2014-05-22: 8 [IU] via SUBCUTANEOUS
  Administered 2014-05-22 – 2014-05-23 (×4): 3 [IU] via SUBCUTANEOUS
  Administered 2014-05-23: 8 [IU] via SUBCUTANEOUS
  Administered 2014-05-23: 5 [IU] via SUBCUTANEOUS
  Administered 2014-05-24 (×2): 3 [IU] via SUBCUTANEOUS
  Administered 2014-05-24 (×2): 5 [IU] via SUBCUTANEOUS
  Administered 2014-05-25: 2 [IU] via SUBCUTANEOUS

## 2014-05-15 MED ORDER — INSULIN ASPART 100 UNIT/ML ~~LOC~~ SOLN
0.0000 [IU] | Freq: Three times a day (TID) | SUBCUTANEOUS | Status: DC
Start: 2014-05-15 — End: 2014-05-25

## 2014-05-15 MED ORDER — LISINOPRIL 5 MG PO TABS
5.0000 mg | ORAL_TABLET | Freq: Every day | ORAL | Status: DC
  Administered 2014-05-16 – 2014-05-23 (×8): 5 mg via ORAL
  Filled 2014-05-15 (×9): qty 1

## 2014-05-15 MED ORDER — INSULIN GLARGINE 100 UNIT/ML ~~LOC~~ SOLN
8.0000 [IU] | Freq: Every day | SUBCUTANEOUS | Status: DC
Start: 2014-05-15 — End: 2014-05-25
  Administered 2014-05-15 – 2014-05-24 (×10): 8 [IU] via SUBCUTANEOUS
  Filled 2014-05-15 (×11): qty 0.08

## 2014-05-15 MED ORDER — ACETAMINOPHEN 325 MG PO TABS
650.0000 mg | ORAL_TABLET | Freq: Four times a day (QID) | ORAL | Status: DC | PRN
  Administered 2014-05-15 – 2014-05-19 (×6): 650 mg via ORAL
  Filled 2014-05-15 (×6): qty 2

## 2014-05-15 MED ORDER — MAGNESIUM OXIDE 400 (241.3 MG) MG PO TABS: 400.0000 mg | ORAL_TABLET | Freq: Two times a day (BID) | ORAL | Status: DC

## 2014-05-15 MED ORDER — ONDANSETRON HCL 4 MG/2ML IJ SOLN: 4.0000 mg | Freq: Four times a day (QID) | INTRAMUSCULAR | Status: DC | PRN

## 2014-05-15 MED ORDER — METOPROLOL SUCCINATE ER 25 MG PO TB24
25.0000 mg | ORAL_TABLET | Freq: Every day | ORAL | Status: DC
  Administered 2014-05-16 – 2014-05-23 (×8): 25 mg via ORAL
  Filled 2014-05-15 (×9): qty 1

## 2014-05-15 MED ORDER — PANTOPRAZOLE SODIUM 40 MG PO TBEC
40.0000 mg | DELAYED_RELEASE_TABLET | Freq: Every day | ORAL | Status: DC
  Administered 2014-05-16 – 2014-05-25 (×10): 40 mg via ORAL
  Filled 2014-05-15 (×11): qty 1

## 2014-05-15 MED ORDER — MAGNESIUM OXIDE 400 (241.3 MG) MG PO TABS
400.0000 mg | ORAL_TABLET | Freq: Two times a day (BID) | ORAL | Status: DC
  Administered 2014-05-15 – 2014-05-25 (×20): 400 mg via ORAL
  Filled 2014-05-15 (×22): qty 1

## 2014-05-15 MED ORDER — ALBUTEROL SULFATE (2.5 MG/3ML) 0.083% IN NEBU
2.5000 mg | INHALATION_SOLUTION | Freq: Four times a day (QID) | RESPIRATORY_TRACT | Status: DC | PRN
  Administered 2014-05-15 – 2014-05-16 (×2): 2.5 mg via RESPIRATORY_TRACT
  Filled 2014-05-15 (×2): qty 3

## 2014-05-15 MED ORDER — INSULIN GLARGINE 100 UNIT/ML ~~LOC~~ SOLN: 8.0000 [IU] | Freq: Every day | SUBCUTANEOUS | Status: DC

## 2014-05-15 NOTE — Progress Notes (Signed)
Patient ID: [name], female   DOB: Aug 15, 1933, 80 y.o.   MRN: [id number] Pt was admitted to room 4 W 03. Admission vital sign are stable

## 2014-05-15 NOTE — Consult Note (Signed)
Physical Medicine and Rehabilitation Consult Reason for Consult: Recent CVA/GI bleed Referring Physician: [facility]   HPI: [name] is a 80 y.o. right-handed female with history of CAD with recent NSTEMI, diabetes mellitus and peripheral neuropathy, diastolic congestive heart failure, chronic renal insufficiency with creatinine 2.98, GI bleed and early dementia. Patient well-known rehabilitation services from CVA in May of 2015 with admit date status CIR 02/10/2014 to 02/20/2014. She was discharged to home requiring supervision for transfers and all mobility. She recently been placed on aspirin from both a CVA prophylaxis standpoint and cardiac history. Presented 05/10/2014 with diffuse weakness and shortness of breath. A CBC showed a hemoglobin of 4.7. Gastroenterology services Dr. Carlean Purl consulted. Colonoscopy completed 05/12/2014 showing a normal colon. Underwent enteroscopy 05/13/2014 with AV malformation found in the proximal jejunum. The remainder of the small bowel is otherwise normal.  Patient has been transfused with latest hemoglobin 9.1. An MRI of the head was completed due to lethargy possible right-sided weakness showing 2 areas of acute infarct in the right parietal lobe. Await plan for possible aspirin therapy for CVA prophylaxis. Will physical therapy evaluation completed 05/14/2014 at the recommendations of physical medicine rehabilitation consult.   Review of Systems  Unable to perform ROS: language   Past Medical History  Diagnosis Date  . Asthma   . CAD (coronary artery disease)     Pt reports MI in 2006 (no documentation).  Cardiolite in 05/2002 and 07/2006 did not reveal any reversible ischemia.  Pt follows with Dr. Rex Kras at [facility].  . CHF (congestive heart failure)     EF 25-30% with dilated LV, mild LVH, severe hypokinesis, and mod-severe reduction in RV function  . Osteoporosis   . HYPERTENSION 08/03/2006  . GASTROPARESIS, DIABETIC 08/03/2006  . HYPERLIPIDEMIA  08/03/2006  . OBSTRUCTIVE SLEEP APNEA 01/06/2008  . PERIPHERAL NEUROPATHY 08/03/2006  . GERD 08/03/2006  . LOW BACK PAIN, CHRONIC 08/03/2006  . OSTEOPOROSIS 03/21/2009  . CEREBRAL EMBOLISM, WITH INFARCTION 07/02/2010  . Angina   . Myocardial infarction "2 or 3"  . Pneumonia 02/26/12    "a few times; probably even today"  . Shortness of breath     "all the time"  . DIABETES MELLITUS, TYPE II 11/04/1983  . Blood transfusion 08/2011  . Lower GI bleeding 08/2011 and 01/2014  . Chronic daily headache   . Migraines   . Stroke summer 2011    "made my left hip worse"  . Uterine cancer   . Nonischemic cardiomyopathy 05/2010    Left heart catheterization:2011. Nonobstructive coronary artery disease.  . Pulmonary hypertension   . Hypertension   . NSTEMI (non-ST elevated myocardial infarction) 01/2014    type II with CVA  . Acute embolic stroke 2/77/4128  . Non-ST elevation MI (NSTEMI) 02/04/2014  . E. coli UTI 03/02/2014  . Renal failure, acute 02/04/2014  . Iron deficiency anemia secondary to blood loss (chronic) 01/06/2008    Qualifier: Diagnosis of  By: Tomasa Hosteller MD, Veronique D.   . Nonsustained ventricular tachycardia   . Angiodysplasia of intestine with hemorrhage 05/13/2014   Past Surgical History  Procedure Laterality Date  . Esophagogastroduodenoscopy  08/26/2011    Procedure: ESOPHAGOGASTRODUODENOSCOPY (EGD);  Surgeon: [name], MD;  Location[facility] END[facility]COPY;  Service: Endoscopy;  Laterality: N/A;  . Colonoscopy  08/28/2011    Procedure: COLONOSCOPY;  Surgeon: [name], MD;  Location: [place];  Service: Endoscopy;  Laterality: N/A;  . Vaginal hysterectomy    . Tubal ligation    .  Cataract extraction w/ intraocular lens  implant, bilateral    . Toe surgery      "right big toe; operated on it to straighten it out; it was under"  . Polysomnogram  10/17/2005    AHI-7.28/hr. AHI REM-20.8/hr. Average oxygen saturation range during REM and NREM was 97%. Lowest oxygen saturation  during REM sleep was 90%.  . Carotid duplex  05/28/2010    No significant extracranial carotid artery stenosis demonstrated. Vertebrals are patent w/ antegrade flow.  . Cardiac catheterization  05/24/2010    No intervention - recommed medical therapy.  . Cardiovascular stress test  08/07/2006    Moderate-severe defect seen in Basal inferior, Mid inferoseptal, Mid inferior, Mid inferolateral, and Apical inferior regions - consistent w/ infarct/scar. No scintigraphic evidence of inducible myocardial ischemia.  . Transthoracic echocardiogram  08/29/2011    EF 55-60%, moderate LVH,   . Esophagogastroduodenoscopy N/A 02/08/2014    Procedure: ESOPHAGOGASTRODUODENOSCOPY (EGD);  Surgeon: [name], MD;  Location: [facility] ENDOSCOPY;  Service: Endoscopy;  Laterality: N/A;   Family History  Problem Relation Age of Onset  . Diabetes insipidus Mother   . Hypertension Mother   . Hypertension Father   . Hypertension Sister   . Hypertension Child   . Stomach cancer Brother    Social History:  reports that she has never smoked. She quit smokeless tobacco use about 7 years ago. Her smokeless tobacco use included Snuff. She reports that she does not drink alcohol or use illicit drugs. Allergies:  Allergies  Allergen Reactions  . Baking Soda-Fluoride [Sodium Fluoride] Nausea And Vomiting  . Magnesium Hydroxide Nausea And Vomiting   Medications Prior to Admission  Medication Sig Dispense Refill  . acetaminophen (TYLENOL) 500 MG tablet Take 500 mg by mouth every 6 (six) hours as needed for moderate pain or headache.      . albuterol (PROAIR HFA) 108 (90 BASE) MCG/ACT inhaler Inhale 2 puffs into the lungs every 6 (six) hours as needed for wheezing.  8.5 g  11  . amLODipine (NORVASC) 10 MG tablet Take 1 tablet (10 mg total) by mouth daily.  30 tablet  2  . aspirin 81 MG tablet Take 81 mg by mouth daily.      Marland Kitchen atorvastatin (LIPITOR) 40 MG tablet Take 1 tablet (40 mg total) by mouth daily at 6 PM.  30 tablet   11  . ferrous sulfate 325 (65 FE) MG tablet Take 1 tablet (325 mg total) by mouth 2 (two) times daily with a meal.  60 tablet  5  . insulin glargine (LANTUS) 100 UNIT/ML injection Inject 0.1 mLs (10 Units total) into the skin at bedtime.  10 mL  11  . isosorbide mononitrate (IMDUR) 60 MG 24 hr tablet Take 1 tablet (60 mg total) by mouth daily.  30 tablet  2  . KLOR-CON 10 10 MEQ tablet Take 1 tablet by mouth every other day.      . lisinopril (PRINIVIL,ZESTRIL) 5 MG tablet Take 5 mg by mouth daily.      Marland Kitchen loratadine (ALLERGY RELIEF) 10 MG tablet Take 10 mg by mouth daily.      . magnesium oxide (MAG-OX) 400 MG tablet Take 1 tablet (400 mg total) by mouth 2 (two) times daily. Take 400 mg one tablet two times a day for five days then cut back to once a day after those 5 days  40 tablet  6  . metoprolol succinate (TOPROL-XL) 25 MG 24 hr tablet Take 1 tablet (  25 mg total) by mouth daily.  30 tablet  0  . Multiple Vitamins-Minerals (ALIVE WOMENS 50+ PO) Take 1 tablet by mouth daily.      Marland Kitchen omeprazole (PRILOSEC) 20 MG capsule Take 1 capsule (20 mg total) by mouth daily before breakfast.  30 capsule  11  . polyethylene glycol (MIRALAX / GLYCOLAX) packet Take 17 g by mouth daily.      Marland Kitchen torsemide (DEMADEX) 10 MG tablet Take 5 mg by mouth every other day.         Home: Home Living Family/patient expects to be discharged to:: Private residence Living Arrangements: Children Available Help at Discharge: Family;Personal care attendant;Available 24 hours/day Type of Home: House Home Access: Stairs to enter [employer] of Steps: 2 Entrance Stairs-Rails: None Home Layout: One level Home Equipment: Walker - 2 wheels;Shower seat Additional Comments: son reports pt to have caregiver that comes M-F 9-1 to assist with dressing/bathing.  Functional History: Prior Function Level of Independence: Needs assistance Gait / Transfers Assistance Needed: supervision with RW ADL's / Homemaking Assistance  Needed: son reports pt can bath self however also reports there is a caregiver that comes to assist with that as well Functional Status:  Mobility: Bed Mobility Overal bed mobility: Needs Assistance Bed Mobility: Supine to Sit Supine to sit: Min assist General bed mobility comments: pt initiated transfer and brough self to EOB Transfers Overall transfer level: Needs assistance Equipment used: Rolling walker (2 wheeled) Transfers: Sit to/from Stand Sit to Stand: Min assist General transfer comment: v/c's for safe hand placement, minA to initiate anterior transfer Ambulation/Gait Ambulation/Gait assistance: Mod assist Ambulation Distance (Feet): 20 Feet Assistive device: Rolling walker (2 wheeled) Gait Pattern/deviations: Step-to pattern;Decreased step length - right;Decreased stance time - right;Shuffle;Narrow base of support Gait velocity: slow General Gait Details: pt with + tremors/shaking, son reports this to be at baseline. minA for walker mangement    ADL:    Cognition: Cognition Overall Cognitive Status: Impaired/Different from baseline Orientation Level: Oriented to person;Disoriented to place;Disoriented to time;Disoriented to situation Cognition Arousal/Alertness: Awake/alert Behavior During Therapy: WFL for tasks assessed/performed Overall Cognitive Status: Impaired/Different from baseline Area of Impairment: Orientation;Attention;Memory;Following commands;Safety/judgement;Awareness;Problem solving Orientation Level: Disoriented to;Time;Situation (required cuing for date) Current Attention Level: Sustained Memory: Decreased short-term memory Following Commands: Follows one step commands with increased time Awareness: Intellectual Problem Solving: Slow processing;Difficulty sequencing;Requires verbal cues;Requires tactile cues  Blood pressure 157/56, pulse 70, temperature 99.1 F (37.3 C), temperature source Oral, resp. rate 22, height 5\' 3"  (1.6 m), weight 70.7 kg  (155 lb 13.8 oz), last menstrual period 12/19/1968, SpO2 95.00%. Physical Exam  HENT:  Head: Normocephalic.  Eyes: EOM are normal.  Neck: Normal range of motion. Neck supple. No thyromegaly present.  Cardiovascular:  Cardiac rate controlled  Respiratory: Effort normal and breath sounds normal. No respiratory distress.  GI: Soft. Bowel sounds are normal. She exhibits no distension.  Neurological: She is alert.  Expressive aphasia. She will answer some simple yes no questions but inaccurate. Follows some simple demonstrated commands  Skin: Skin is warm and dry.    Results for orders placed during the hospital encounter of 05/10/14 (from the past 24 hour(s))  GLUCOSE, CAPILLARY     Status: None   Collection Time    05/14/14  8:15 AM      Result Value Ref Range   Glucose-Capillary 87  70 - 99 mg/dL   Comment 1 Documented in Chart     Comment 2 Notify RN    GLUCOSE,  CAPILLARY     Status: Abnormal   Collection Time    05/14/14 11:50 AM      Result Value Ref Range   Glucose-Capillary 166 (*) 70 - 99 mg/dL   Comment 1 Documented in Chart     Comment 2 Notify RN    GLUCOSE, CAPILLARY     Status: Abnormal   Collection Time    05/14/14  3:42 PM      Result Value Ref Range   Glucose-Capillary 214 (*) 70 - 99 mg/dL   Comment 1 Documented in Chart     Comment 2 Notify RN    GLUCOSE, CAPILLARY     Status: Abnormal   Collection Time    05/14/14  7:22 PM      Result Value Ref Range   Glucose-Capillary 182 (*) 70 - 99 mg/dL   Comment 1 Notify RN    GLUCOSE, CAPILLARY     Status: Abnormal   Collection Time    05/14/14 10:00 PM      Result Value Ref Range   Glucose-Capillary 172 (*) 70 - 99 mg/dL   Comment 1 Notify RN    CBC WITH DIFFERENTIAL     Status: Abnormal   Collection Time    05/15/14  2:54 AM      Result Value Ref Range   WBC 12.1 (*) 4.0 - 10.5 K/uL   RBC 3.45 (*) 3.87 - 5.11 MIL/uL   Hemoglobin 9.1 (*) 12.0 - 15.0 g/dL   HCT 28.1 (*) 36.0 - 46.0 %   MCV 81.4  78.0 -  100.0 fL   MCH 26.4  26.0 - 34.0 pg   MCHC 32.4  30.0 - 36.0 g/dL   RDW 16.9 (*) 11.5 - 15.5 %   Platelets 339  150 - 400 K/uL   Neutrophils Relative % 83 (*) 43 - 77 %   Neutro Abs 10.0 (*) 1.7 - 7.7 K/uL   Lymphocytes Relative 10 (*) 12 - 46 %   Lymphs Abs 1.2  0.7 - 4.0 K/uL   Monocytes Relative 6  3 - 12 %   Monocytes Absolute 0.8  0.1 - 1.0 K/uL   Eosinophils Relative 1  0 - 5 %   Eosinophils Absolute 0.1  0.0 - 0.7 K/uL   Basophils Relative 0  0 - 1 %   Basophils Absolute 0.0  0.0 - 0.1 K/uL  COMPREHENSIVE METABOLIC PANEL     Status: Abnormal   Collection Time    05/15/14  2:54 AM      Result Value Ref Range   Sodium 134 (*) 137 - 147 mEq/L   Potassium 4.5  3.7 - 5.3 mEq/L   Chloride 104  96 - 112 mEq/L   CO2 17 (*) 19 - 32 mEq/L   Glucose, Bld 169 (*) 70 - 99 mg/dL   BUN 22  6 - 23 mg/dL   Creatinine, Ser 1.48 (*) 0.50 - 1.10 mg/dL   Calcium 8.1 (*) 8.4 - 10.5 mg/dL   Total Protein 6.1  6.0 - 8.3 g/dL   Albumin 2.1 (*) 3.5 - 5.2 g/dL   AST 29  0 - 37 U/L   ALT 58 (*) 0 - 35 U/L   Alkaline Phosphatase 116  39 - 117 U/L   Total Bilirubin 0.7  0.3 - 1.2 mg/dL   GFR calc non Af Amer 33 (*) >90 mL/min   GFR calc Af Amer 38 (*) >90 mL/min   Anion gap 13  5 -  15  MAGNESIUM     Status: None   Collection Time    05/15/14  2:54 AM      Result Value Ref Range   Magnesium 1.8  1.5 - 2.5 mg/dL   No results found.  Assessment/Plan: Diagnosis: right parietal lobe infarct 1. Does the need for close, 24 hr/day medical supervision in concert with the patient's rehab needs make it unreasonable for this patient to be served in a less intensive setting? Yes 2. Co-Morbidities requiring supervision/potential complications: dm, htn, cad, afib 3. Due to bladder management, bowel management, safety, skin/wound care, disease management, medication administration, pain management and patient education, does the patient require 24 hr/day rehab nursing? Yes 4. Does the patient require  coordinated care of a physician, rehab nurse, PT (1-2 hrs/day, 5 days/week), OT (1-2 hrs/day, 5 days/week) and SLP (1-2 hrs/day, 5 days/week) to address physical and functional deficits in the context of the above medical diagnosis(es)? Yes Addressing deficits in the following areas: balance, endurance, locomotion, strength, transferring, bowel/bladder control, bathing, dressing, feeding, grooming, toileting, cognition and psychosocial support 5. Can the patient actively participate in an intensive therapy program of at least 3 hrs of therapy per day at least 5 days per week? Yes 6. The potential for patient to make measurable gains while on inpatient rehab is excellent 7. Anticipated functional outcomes upon discharge from inpatient rehab are supervision and min assist  with PT, supervision and min assist with OT, supervision and min assist with SLP. 8. Estimated rehab length of stay to reach the above functional goals is: 10-14 days 9. Does the patient have adequate social supports to accommodate these discharge functional goals? Yes 10. Anticipated D/C setting: Home 11. Anticipated post D/C treatments: [place] therapy 12. Overall Rehab/Functional Prognosis: excellent  RECOMMENDATIONS: This patient's condition is appropriate for continued rehabilitative care in the following setting: CIR Patient has agreed to participate in recommended program. Yes Note that insurance prior authorization may be required for reimbursement for recommended care.  Comment: Rehab Admissions Coordinator to follow up.  Thanks,  Meredith Staggers, MD, Mellody Drown     05/15/2014

## 2014-05-15 NOTE — Progress Notes (Signed)
Physical Medicine and Rehabilitation Consult  Reason for Consult: Recent CVA/GI bleed  Referring Physician: [facility]  HPI: [name] is a 80 y.o. right-handed female with history of CAD with recent NSTEMI, diabetes mellitus and peripheral neuropathy, diastolic congestive heart failure, chronic renal insufficiency with creatinine 2.98, GI bleed and early dementia. Patient well-known rehabilitation services from [facility] in May of 2015 with admit date status CIR 02/10/2014 to 02/20/2014. She was discharged to home requiring supervision for transfers and all mobility. She recently been placed on aspirin from both a CVA prophylaxis standpoint and cardiac history. Presented 05/10/2014 with diffuse weakness and shortness of breath. A CBC showed a hemoglobin of 4.7. Gastroenterology services Dr. Carlean Purl consulted. Colonoscopy completed 05/12/2014 showing a normal colon. Underwent enteroscopy 05/13/2014 with AV malformation found in the proximal jejunum. The remainder of the small bowel is otherwise normal. Patient has been transfused with latest hemoglobin 9.1. An MRI of the head was completed due to lethargy possible right-sided weakness showing 2 areas of acute infarct in the right parietal lobe. Await plan for possible aspirin therapy for CVA prophylaxis. Will physical therapy evaluation completed 05/14/2014 at the recommendations of physical medicine rehabilitation consult.  Review of Systems  Unable to perform ROS: language   Past Medical History   Diagnosis  Date   .  Asthma    .  CAD (coronary artery disease)      Pt reports MI in 2006 (no documentation). Cardiolite in 05/2002 and 07/2006 did not reveal any reversible ischemia. Pt follows with Dr. Rex Kras at [facility].   .  CHF (congestive heart failure)      EF 25-30% with dilated LV, mild LVH, severe hypokinesis, and mod-severe reduction in RV function   .  Osteoporosis    .  HYPERTENSION  08/03/2006   .  GASTROPARESIS, DIABETIC  08/03/2006   .   HYPERLIPIDEMIA  08/03/2006   .  OBSTRUCTIVE SLEEP APNEA  01/06/2008   .  PERIPHERAL NEUROPATHY  08/03/2006   .  GERD  08/03/2006   .  LOW BACK PAIN, CHRONIC  08/03/2006   .  OSTEOPOROSIS  03/21/2009   .  CEREBRAL EMBOLISM, WITH INFARCTION  07/02/2010   .  Angina    .  Myocardial infarction  "2 or 3"   .  Pneumonia  02/26/12     "a few times; probably even today"   .  Shortness of breath      "all the time"   .  DIABETES MELLITUS, TYPE II  11/04/1983   .  Blood transfusion  08/2011   .  Lower GI bleeding  08/2011 and 01/2014   .  Chronic daily headache    .  Migraines    .  Stroke  summer 2011     "made my left hip worse"   .  Uterine cancer    .  Nonischemic cardiomyopathy  05/2010     Left heart catheterization:2011. Nonobstructive coronary artery disease.   .  Pulmonary hypertension    .  Hypertension    .  NSTEMI (non-ST elevated myocardial infarction)  01/2014     type II with CVA   .  Acute embolic stroke  01/20/7352   .  Non-ST elevation MI (NSTEMI)  02/04/2014   .  E. coli UTI  03/02/2014   .  Renal failure, acute  02/04/2014   .  Iron deficiency anemia secondary to blood loss (chronic)  01/06/2008     Qualifier: Diagnosis of By: Tomasa Hosteller  MD, Veronique D.   .  Nonsustained ventricular tachycardia    .  Angiodysplasia of intestine with hemorrhage  05/13/2014    Past Surgical History   Procedure  Laterality  Date   .  Esophagogastroduodenoscopy   08/26/2011     Procedure: ESOPHAGOGASTRODUODENOSCOPY (EGD); Surgeon: [name], MD; Location: [facility] ENDOSCOPY; Service: Endoscopy; Laterality: N/A;   .  Colonoscopy   08/28/2011     Procedure: COLONOSCOPY; Surgeon: [name], MD; Location: [place]; Service: Endoscopy; Laterality: N/A;   .  Vaginal hysterectomy     .  Tubal ligation     .  Cataract extraction w/ intraocular lens implant, bilateral     .  Toe surgery       "right big toe; operated on it to straighten it out; it was under"   .  Polysomnogram   10/17/2005      AHI-7.28/hr. AHI REM-20.8/hr. Average oxygen saturation range during REM and NREM was 97%. Lowest oxygen saturation during REM sleep was 90%.   .  Carotid duplex   05/28/2010     No significant extracranial carotid artery stenosis demonstrated. Vertebrals are patent w/ antegrade flow.   .  Cardiac catheterization   05/24/2010     No intervention - recommed medical therapy.   .  Cardiovascular stress test   08/07/2006     Moderate-severe defect seen in Basal inferior, Mid inferoseptal, Mid inferior, Mid inferolateral, and Apical inferior regions - consistent w/ infarct/scar. No scintigraphic evidence of inducible myocardial ischemia.   .  Transthoracic echocardiogram   08/29/2011     EF 55-60%, moderate LVH,   .  Esophagogastroduodenoscopy  N/A  02/08/2014     Procedure: ESOPHAGOGASTRODUODENOSCOPY (EGD); Surgeon: [name], MD; Location: [facility] ENDOSCOPY; Service: Endoscopy; Laterality: N/A;    Family History   Problem  Relation  Age of Onset   .  Diabetes insipidus  Mother    .  Hypertension  Mother    .  Hypertension  Father    .  Hypertension  Sister    .  Hypertension  Child    .  Stomach cancer  Brother     Social History: reports that she has never smoked. She quit smokeless tobacco use about 7 years ago. Her smokeless tobacco use included Snuff. She reports that she does not drink alcohol or use illicit drugs.  Allergies:  Allergies   Allergen  Reactions   .  Baking Soda-Fluoride [Sodium Fluoride]  Nausea And Vomiting   .  Magnesium Hydroxide  Nausea And Vomiting    Medications Prior to Admission   Medication  Sig  Dispense  Refill   .  acetaminophen (TYLENOL) 500 MG tablet  Take 500 mg by mouth every 6 (six) hours as needed for moderate pain or headache.     .  albuterol (PROAIR HFA) 108 (90 BASE) MCG/ACT inhaler  Inhale 2 puffs into the lungs every 6 (six) hours as needed for wheezing.  8.5 g  11   .  amLODipine (NORVASC) 10 MG tablet  Take 1 tablet (10 mg total) by mouth daily.   30 tablet  2   .  aspirin 81 MG tablet  Take 81 mg by mouth daily.     Marland Kitchen  atorvastatin (LIPITOR) 40 MG tablet  Take 1 tablet (40 mg total) by mouth daily at 6 PM.  30 tablet  11   .  ferrous sulfate 325 (65 FE) MG tablet  Take 1 tablet (  325 mg total) by mouth 2 (two) times daily with a meal.  60 tablet  5   .  insulin glargine (LANTUS) 100 UNIT/ML injection  Inject 0.1 mLs (10 Units total) into the skin at bedtime.  10 mL  11   .  isosorbide mononitrate (IMDUR) 60 MG 24 hr tablet  Take 1 tablet (60 mg total) by mouth daily.  30 tablet  2   .  KLOR-CON 10 10 MEQ tablet  Take 1 tablet by mouth every other day.     .  lisinopril (PRINIVIL,ZESTRIL) 5 MG tablet  Take 5 mg by mouth daily.     Marland Kitchen  loratadine (ALLERGY RELIEF) 10 MG tablet  Take 10 mg by mouth daily.     .  magnesium oxide (MAG-OX) 400 MG tablet  Take 1 tablet (400 mg total) by mouth 2 (two) times daily. Take 400 mg one tablet two times a day for five days then cut back to once a day after those 5 days  40 tablet  6   .  metoprolol succinate (TOPROL-XL) 25 MG 24 hr tablet  Take 1 tablet (25 mg total) by mouth daily.  30 tablet  0   .  Multiple Vitamins-Minerals (ALIVE WOMENS 50+ PO)  Take 1 tablet by mouth daily.     Marland Kitchen  omeprazole (PRILOSEC) 20 MG capsule  Take 1 capsule (20 mg total) by mouth daily before breakfast.  30 capsule  11   .  polyethylene glycol (MIRALAX / GLYCOLAX) packet  Take 17 g by mouth daily.     Marland Kitchen  torsemide (DEMADEX) 10 MG tablet  Take 5 mg by mouth every other day.      Home:  Home Living  Family/patient expects to be discharged to:: Private residence  Living Arrangements: Children  Available Help at Discharge: Family;Personal care attendant;Available 24 hours/day  Type of Home: House  Home Access: Stairs to enter  [employer] of Steps: 2  Entrance Stairs-Rails: None  Home Layout: One level  Home Equipment: Walker - 2 wheels;Shower seat  Additional Comments: son reports pt to have caregiver that  comes M-F 9-1 to assist with dressing/bathing.  Functional History:  Prior Function  Level of Independence: Needs assistance  Gait / Transfers Assistance Needed: supervision with RW  ADL's / Homemaking Assistance Needed: son reports pt can bath self however also reports there is a caregiver that comes to assist with that as well  Functional Status:  Mobility:  Bed Mobility  Overal bed mobility: Needs Assistance  Bed Mobility: Supine to Sit  Supine to sit: Min assist  General bed mobility comments: pt initiated transfer and brough self to EOB  Transfers  Overall transfer level: Needs assistance  Equipment used: Rolling walker (2 wheeled)  Transfers: Sit to/from Stand  Sit to Stand: Min assist  General transfer comment: v/c's for safe hand placement, minA to initiate anterior transfer  Ambulation/Gait  Ambulation/Gait assistance: Mod assist  Ambulation Distance (Feet): 20 Feet  Assistive device: Rolling walker (2 wheeled)  Gait Pattern/deviations: Step-to pattern;Decreased step length - right;Decreased stance time - right;Shuffle;Narrow base of support  Gait velocity: slow  General Gait Details: pt with + tremors/shaking, son reports this to be at baseline. minA for walker mangement   ADL:   Cognition:  Cognition  Overall Cognitive Status: Impaired/Different from baseline  Orientation Level: Oriented to person;Disoriented to place;Disoriented to time;Disoriented to situation  Cognition  Arousal/Alertness: Awake/alert  Behavior During Therapy: [facility] for tasks assessed/performed  Overall  Cognitive Status: Impaired/Different from baseline  Area of Impairment: Orientation;Attention;Memory;Following commands;Safety/judgement;Awareness;Problem solving  Orientation Level: Disoriented to;Time;Situation (required cuing for date)  Current Attention Level: Sustained  Memory: Decreased short-term memory  Following Commands: Follows one step commands with increased time  Awareness:  Intellectual  Problem Solving: Slow processing;Difficulty sequencing;Requires verbal cues;Requires tactile cues  Blood pressure 157/56, pulse 70, temperature 99.1 F (37.3 C), temperature source Oral, resp. rate 22, height 5\' 3"  (1.6 m), weight 70.7 kg (155 lb 13.8 oz), last menstrual period 12/19/1968, SpO2 95.00%.  Physical Exam  HENT:  Head: Normocephalic.  Eyes: EOM are normal.  Neck: Normal range of motion. Neck supple. No thyromegaly present.  Cardiovascular:  Cardiac rate controlled  Respiratory: Effort normal and breath sounds normal. No respiratory distress.  GI: Soft. Bowel sounds are normal. She exhibits no distension.  Neurological: She is alert.  Expressive aphasia. She will answer some simple yes no questions but inaccurate. Follows some simple demonstrated commands  Skin: Skin is warm and dry.   Results for orders placed during the hospital encounter of 05/10/14 (from the past 24 hour(s))   GLUCOSE, CAPILLARY Status: None    Collection Time    05/14/14 8:15 AM   Result  Value  Ref Range    Glucose-Capillary  87  70 - 99 mg/dL    Comment 1  Documented in Chart     Comment 2  Notify RN    GLUCOSE, CAPILLARY Status: Abnormal    Collection Time    05/14/14 11:50 AM   Result  Value  Ref Range    Glucose-Capillary  166 (*)  70 - 99 mg/dL    Comment 1  Documented in Chart     Comment 2  Notify RN    GLUCOSE, CAPILLARY Status: Abnormal    Collection Time    05/14/14 3:42 PM   Result  Value  Ref Range    Glucose-Capillary  214 (*)  70 - 99 mg/dL    Comment 1  Documented in Chart     Comment 2  Notify RN    GLUCOSE, CAPILLARY Status: Abnormal    Collection Time    05/14/14 7:22 PM   Result  Value  Ref Range    Glucose-Capillary  182 (*)  70 - 99 mg/dL    Comment 1  Notify RN    GLUCOSE, CAPILLARY Status: Abnormal    Collection Time    05/14/14 10:00 PM   Result  Value  Ref Range    Glucose-Capillary  172 (*)  70 - 99 mg/dL    Comment 1  Notify RN    CBC WITH  DIFFERENTIAL Status: Abnormal    Collection Time    05/15/14 2:54 AM   Result  Value  Ref Range    WBC  12.1 (*)  4.0 - 10.5 K/uL    RBC  3.45 (*)  3.87 - 5.11 MIL/uL    Hemoglobin  9.1 (*)  12.0 - 15.0 g/dL    HCT  28.1 (*)  36.0 - 46.0 %    MCV  81.4  78.0 - 100.0 fL    MCH  26.4  26.0 - 34.0 pg    MCHC  32.4  30.0 - 36.0 g/dL    RDW  16.9 (*)  11.5 - 15.5 %    Platelets  339  150 - 400 K/uL    Neutrophils Relative %  83 (*)  43 - 77 %    Neutro Abs  10.0 (*)  1.7 - 7.7 K/uL    Lymphocytes Relative  10 (*)  12 - 46 %    Lymphs Abs  1.2  0.7 - 4.0 K/uL    Monocytes Relative  6  3 - 12 %    Monocytes Absolute  0.8  0.1 - 1.0 K/uL    Eosinophils Relative  1  0 - 5 %    Eosinophils Absolute  0.1  0.0 - 0.7 K/uL    Basophils Relative  0  0 - 1 %    Basophils Absolute  0.0  0.0 - 0.1 K/uL   COMPREHENSIVE METABOLIC PANEL Status: Abnormal    Collection Time    05/15/14 2:54 AM   Result  Value  Ref Range    Sodium  134 (*)  137 - 147 mEq/L    Potassium  4.5  3.7 - 5.3 mEq/L    Chloride  104  96 - 112 mEq/L    CO2  17 (*)  19 - 32 mEq/L    Glucose, Bld  169 (*)  70 - 99 mg/dL    BUN  22  6 - 23 mg/dL    Creatinine, Ser  1.48 (*)  0.50 - 1.10 mg/dL    Calcium  8.1 (*)  8.4 - 10.5 mg/dL    Total Protein  6.1  6.0 - 8.3 g/dL    Albumin  2.1 (*)  3.5 - 5.2 g/dL    AST  29  0 - 37 U/L    ALT  58 (*)  0 - 35 U/L    Alkaline Phosphatase  116  39 - 117 U/L    Total Bilirubin  0.7  0.3 - 1.2 mg/dL    GFR calc non Af Amer  33 (*)  >90 mL/min    GFR calc Af Amer  38 (*)  >90 mL/min    Anion gap  13  5 - 15   MAGNESIUM Status: None    Collection Time    05/15/14 2:54 AM   Result  Value  Ref Range    Magnesium  1.8  1.5 - 2.5 mg/dL    No results found.  Assessment/Plan:  Diagnosis: right parietal lobe infarct  1. Does the need for close, 24 hr/day medical supervision in concert with the patient's rehab needs make it unreasonable for this patient to be served in a less intensive  setting? Yes 2. Co-Morbidities requiring supervision/potential complications: dm, htn, cad, afib 3. Due to bladder management, bowel management, safety, skin/wound care, disease management, medication administration, pain management and patient education, does the patient require 24 hr/day rehab nursing? Yes 4. Does the patient require coordinated care of a physician, rehab nurse, PT (1-2 hrs/day, 5 days/week), OT (1-2 hrs/day, 5 days/week) and SLP (1-2 hrs/day, 5 days/week) to address physical and functional deficits in the context of the above medical diagnosis(es)? Yes Addressing deficits in the following areas: balance, endurance, locomotion, strength, transferring, bowel/bladder control, bathing, dressing, feeding, grooming, toileting, cognition and psychosocial support 5. Can the patient actively participate in an intensive therapy program of at least 3 hrs of therapy per day at least 5 days per week? Yes 6. The potential for patient to make measurable gains while on inpatient rehab is excellent 7. Anticipated functional outcomes upon discharge from inpatient rehab are supervision and min assist with PT, supervision and min assist with OT, supervision and min assist with SLP. 8. Estimated rehab length of stay to reach the above functional goals is: 10-14  days 9. Does the patient have adequate social supports to accommodate these discharge functional goals? Yes 10. Anticipated D/C setting: Home 11. Anticipated post D/C treatments: [place] therapy 12. Overall Rehab/Functional Prognosis: excellent RECOMMENDATIONS:  This patient's condition is appropriate for continued rehabilitative care in the following setting: CIR  Patient has agreed to participate in recommended program. Yes  Note that insurance prior authorization may be required for reimbursement for recommended care.  Comment: Rehab Admissions Coordinator to follow up.  Thanks,  Meredith Staggers, MD, Mellody Drown  05/15/2014  Revision History...       Date/Time User Action    05/15/2014 10:37 AM Meredith Staggers, MD Sign    05/15/2014 6:55 AM Cathlyn Parsons, PA-C [place]   View Details Report    Routing History.Marland KitchenMarland Kitchen

## 2014-05-15 NOTE — PMR Pre-admission (Signed)
PMR Admission Coordinator Pre-Admission Assessment  Patient: [name] is an 80 y.o., female MRN: [id number] DOB: 05-03-1933 Height: 5\' 3"  (160 cm) Weight: 70.7 kg (155 lb 13.8 oz)              Insurance Information HMO: No    PPO:       PCP:       IPA:       80/20:       OTHER:   PRIMARY: Medicare A/B      Policy#: [id number] d      Subscriber: Scharlene Corn CM Name:        Phone#:       Fax#:   Pre-Cert#:        Employer: Retired Benefits:  Phone #:       Name: Checked in [place]. Date: 05/13/96     Deduct: $1260      Out of Pocket Max: none      Life Max: unlimited CIR: 100%      SNF: 100 days Outpatient: 80%     Co-Pay: 20% Home Health: 100%      Co-Pay: none DME: 80%     Co-Pay: 20% Providers: patient's choice  SECONDARY: Medicaid [place] access      Policy#: [id number] p      Subscriber: Scharlene Corn CM Name:        Phone#:       Fax#:   Pre-Cert#:                                  Employer: Retired Benefits:  Phone #: [phone]     Name: Automated Eff. Date: Eligible 05/15/14     Deduct:        Out of Pocket Max:        Life Max:   CIR:        SNF:   Outpatient:       Co-Pay:   Home Health:        Co-Pay:   DME:       Co-Pay:     Emergency Contact Information Contact Information   Name Relation Home Work Mobile   [place] Son   [phone]   [place] Daughter   [phone]   [facility] Daughter   [phone]     Current Medical History  Patient Admitting Diagnosis:  R parietal lobe infarct  History of Present Illness: A 80 y.o. right-handed female with history of CAD with recent NSTEMI, diabetes mellitus and peripheral neuropathy, diastolic congestive heart failure, chronic renal insufficiency with creatinine 2.98, GI bleed and early dementia. Patient well-known rehabilitation services from CVA in May of 2015 with admit date status CIR 02/10/2014 to 02/20/2014. She was discharged to home requiring supervision for transfers and all mobility.  She recently been placed on aspirin from both a CVA prophylaxis standpoint and cardiac history. Presented 05/10/2014 with diffuse weakness and shortness of breath. A CBC showed a hemoglobin of 4.7. Gastroenterology services Dr. Carlean Purl consulted. Colonoscopy completed 05/12/2014 showing a normal colon. Underwent enteroscopy 05/13/2014 with AV malformation found in the proximal jejunum. The remainder of the small bowel is otherwise normal. Patient has been transfused with latest hemoglobin 9.1. An MRI of the head was completed due to lethargy possible right-sided weakness showing 2 areas of acute infarct in the right parietal lobe. Await plan for possible aspirin therapy for CVA prophylaxis.   Physical therapy evaluation completed 05/14/2014 with  recommendations for a physical medicine rehabilitation consult.     Total: 5=NIH  Past Medical History  Past Medical History  Diagnosis Date  . Asthma   . CAD (coronary artery disease)     Pt reports MI in 2006 (no documentation).  Cardiolite in 05/2002 and 07/2006 did not reveal any reversible ischemia.  Pt follows with Dr. Rex Kras at [facility].  . CHF (congestive heart failure)     EF 25-30% with dilated LV, mild LVH, severe hypokinesis, and mod-severe reduction in RV function  . Osteoporosis   . HYPERTENSION 08/03/2006  . GASTROPARESIS, DIABETIC 08/03/2006  . HYPERLIPIDEMIA 08/03/2006  . OBSTRUCTIVE SLEEP APNEA 01/06/2008  . PERIPHERAL NEUROPATHY 08/03/2006  . GERD 08/03/2006  . LOW BACK PAIN, CHRONIC 08/03/2006  . OSTEOPOROSIS 03/21/2009  . CEREBRAL EMBOLISM, WITH INFARCTION 07/02/2010  . Angina   . Myocardial infarction "2 or 3"  . Pneumonia 02/26/12    "a few times; probably even today"  . Shortness of breath     "all the time"  . DIABETES MELLITUS, TYPE II 11/04/1983  . Blood transfusion 08/2011  . Lower GI bleeding 08/2011 and 01/2014  . Chronic daily headache   . Migraines   . Stroke summer 2011    "made my left hip worse"  . Uterine cancer   .  Nonischemic cardiomyopathy 05/2010    Left heart catheterization:2011. Nonobstructive coronary artery disease.  . Pulmonary hypertension   . Hypertension   . NSTEMI (non-ST elevated myocardial infarction) 01/2014    type II with CVA  . Acute embolic stroke 0/07/2724  . Non-ST elevation MI (NSTEMI) 02/04/2014  . E. coli UTI 03/02/2014  . Renal failure, acute 02/04/2014  . Iron deficiency anemia secondary to blood loss (chronic) 01/06/2008    Qualifier: Diagnosis of  By: Tomasa Hosteller MD, Veronique D.   . Nonsustained ventricular tachycardia   . Angiodysplasia of intestine with hemorrhage 05/13/2014    Family History  family history includes Diabetes insipidus in her mother; Hypertension in her child, father, mother, and sister; Stomach cancer in her brother.  Prior Rehab/Hospitalizations:  Was on CIR 02/10/14 to 02/20/14 after previous CVA.  Current Medications  Current facility-administered medications:acetaminophen (TYLENOL) tablet 650 mg, 650 mg, Oral, Q6H PRN, Caren Griffins, MD, 650 mg at 05/10/14 1955;  albuterol (PROVENTIL) (2.5 MG/3ML) 0.083% nebulizer solution 2.5 mg, 2.5 mg, Nebulization, Q6H PRN, Caren Griffins, MD;  atorvastatin (LIPITOR) tablet 40 mg, 40 mg, Oral, q1800, Caren Griffins, MD, 40 mg at 05/14/14 1800 insulin aspart (novoLOG) injection 0-15 Units, 0-15 Units, Subcutaneous, TID WC & HS, Allie Bossier, MD, 3 Units at 05/15/14 [phone];  insulin glargine (LANTUS) injection 8 Units, 8 Units, Subcutaneous, QHS, Hosie Poisson, MD, 8 Units at 05/14/14 2213;  isosorbide mononitrate (IMDUR) 24 hr tablet 60 mg, 60 mg, Oral, Daily, Caren Griffins, MD, 60 mg at 05/15/14 1014 lisinopril (PRINIVIL,ZESTRIL) tablet 5 mg, 5 mg, Oral, Daily, Caren Griffins, MD, 5 mg at 05/15/14 1014;  magnesium oxide (MAG-OX) tablet 400 mg, 400 mg, Oral, BID, Caren Griffins, MD, 400 mg at 05/15/14 1013;  metoprolol succinate (TOPROL-XL) 24 hr tablet 25 mg, 25 mg, Oral, Daily, Costin M Gherghe, MD, 25 mg at  05/15/14 1014;  pantoprazole (PROTONIX) EC tablet 40 mg, 40 mg, Oral, QAC breakfast, [name], MD, 40 mg at 05/15/14 4034  Patients Current Diet: Carb Control  Precautions / Restrictions Precautions Precautions: Fall Restrictions Weight Bearing Restrictions: No   Prior Activity Level Household: Mostly  homebound.  Does go out to MD appts every 2 weeks.  Home Assistive Devices / Equipment Home Assistive Devices/Equipment: Psychologist, clinical (specify type);Shower chair with back;CBG Meter Home Equipment: Environmental consultant - 2 wheels;Shower seat  Prior Functional Level Prior Function Level of Independence: Needs assistance Gait / Transfers Assistance Needed: supervision with RW ADL's / Homemaking Assistance Needed: son reports pt can bath self however also reports there is a caregiver that comes to assist with that as well  Current Functional Level Cognition  Overall Cognitive Status: Impaired/Different from baseline Current Attention Level: Sustained Orientation Level: Oriented to person;Disoriented to place;Disoriented to time;Disoriented to situation Following Commands: Follows one step commands with increased time    Extremity Assessment (includes Sensation/Coordination)  Upper Extremity Assessment: RUE deficits/detail  RUE Deficits / Details: pt with noted R UE drift when bilat UE raised to shld 90 deg flex, grossly 3+/5  Lower Extremity Assessment: RLE deficits/detail  RLE Deficits / Details: grossly 4-/5  Cervical / Trunk Assessment: Normal    ADLs  Anticipate ADL deficits and the need for OT    Mobility  Overal bed mobility: Needs Assistance Bed Mobility: Supine to Sit Supine to sit: Min assist General bed mobility comments: pt initiated transfer and brough self to EOB    Transfers  Overall transfer level: Needs assistance Equipment used: Rolling walker (2 wheeled) Transfers: Sit to/from Stand Sit to Stand: Min assist General transfer comment: v/c's for safe hand  placement, minA to initiate anterior transfer    Ambulation / Gait / Stairs / Wheelchair Mobility  Ambulation/Gait Ambulation/Gait assistance: Mod assist Ambulation Distance (Feet): 20 Feet Assistive device: Rolling walker (2 wheeled) Gait Pattern/deviations: Step-to pattern;Decreased step length - right;Decreased stance time - right;Shuffle;Narrow base of support Gait velocity: slow General Gait Details: pt with + tremors/shaking, son reports this to be at baseline. minA for walker mangement    Posture / Balance Overall balance assessment: Needs assistance  Sitting-balance support: Feet supported;No upper extremity supported  Sitting balance-Leahy Scale: Fair  Standing balance support: Bilateral upper extremity supported  Standing balance-Leahy Scale: Poor  Standing balance comment: requires RW for safe standing. pt stood x 2 min for hygiene s/p stool incontinence    Special needs/care consideration BiPAP/CPAP No CPM No Continuous Drip IV No Dialysis No       Life Ve No Oxygen No Special Bed No Trach Size No Wound Vac (area) No      Skin Itchy skin                             Bowel mgmt: Last BM 05/14/14 Bladder mgmt: Currently has a foley catheter Diabetic mgmt Yes, on insulin at home    Previous Home Environment Living Arrangements: Children Available Help at Discharge: Family;Personal care attendant;Available 24 hours/day Type of Home: House Home Layout: One level Home Access: Stairs to enter Entrance Stairs-Rails: None Entrance Stairs-Number of Steps: 2 Home Care Services: Yes Type of Home Care Services: [place] (if known): [place] Additional Comments: son reports pt to have caregiver that comes M-F 9-1 to assist with dressing/bathing.  Discharge Living Setting Plans for Discharge Living Setting: Lives with (comment);Apartment (Lives with her son.) Type of Home at Discharge: Apartment Discharge Home Layout: One level Discharge Home Access:  Stairs to enter Entrance Stairs-Number of Steps: 2 steps up to porch and 1 step into apartment. Does the patient have any problems obtaining your medications?: No  Social/Family/Support Systems Patient Roles:  Parent (Has 5 daughters and 2 sons.) Contact Information: See emergency contacts Anticipated Caregiver: Dulcy Fanny - s0j Anticipated Caregiver's Contact Information: [name] - [phone] Ability/Limitations of Caregiver: Son is unemployed and can assist.  Patient has caregiver assistance daily as well Caregiver Availability: 24/7 Discharge Plan Discussed with Primary Caregiver: Yes Is Caregiver In Agreement with Plan?: Yes Does Caregiver/Family have Issues with Lodging/Transportation while Pt is in Rehab?: No  Goals/Additional Needs Patient/Family Goal for Rehab: PT/OT/ST S/Min assist Expected length of stay: 10-14 days Cultural Considerations: None Dietary Needs: Carb mod med cal, thin liquids Equipment Needs: TBD Pt/Family Agrees to Admission and willing to participate: Yes Program Orientation Provided & Reviewed with Pt/Caregiver Including Roles  & Responsibilities: Yes  Decrease burden of Care through IP rehab admission: N/A  Possible need for SNF placement upon discharge: Not anticipated  Patient Condition: This patient's condition remains as documented in the consult dated 05/15/14, in which the Rehabilitation Physician determined and documented that the patient's condition is appropriate for intensive rehabilitative care in an inpatient rehabilitation facility. Will admit to inpatient rehab today.  Preadmission Screen Completed By:  Retta Diones, 05/15/2014 11:52 AM ______________________________________________________________________   Discussed status with Dr. Naaman Plummer on 05/15/14 at 1209 and received telephone approval for admission today.  Admission Coordinator:  Retta Diones, time1209/Date08/03/15

## 2014-05-15 NOTE — Progress Notes (Signed)
PMR Admission Coordinator Pre-Admission Assessment  Patient: [name] is an 80 y.o., female  MRN: [id number]  DOB: 1932-12-08  Height: 5\' 3"  (160 cm)  Weight: 70.7 kg (155 lb 13.8 oz)  Insurance Information  HMO: No PPO: PCP: IPA: 80/20: OTHER:  PRIMARY: Medicare A/B Policy#: [id number] d Subscriber: Scharlene Corn  CM Name: Phone#: Fax#:  Pre-Cert#: Employer: Retired  Benefits: Phone #: Name: Checked in [place]. Date: 05/13/96 Deduct: $1260 Out of Pocket Max: none Life Max: unlimited  CIR: 100% SNF: 100 days  Outpatient: 80% Co-Pay: 20%  Home Health: 100% Co-Pay: none  DME: 80% Co-Pay: 20%  Providers: patient's choice   SECONDARY: Medicaid [place] access Policy#: [id number] p Subscriber: Scharlene Corn  CM Name: Phone#: Fax#:  Pre-Cert#: Employer: Retired  Benefits: Phone #: [phone] Name: Automated  Eff. Date: Eligible 05/15/14 Deduct: Out of Pocket Max: Life Max:  CIR: SNF:  Outpatient: Co-Pay:  Home Health: Co-Pay:  DME: Co-Pay:  Emergency Contact Information  Contact Information    Name  Relation  Home  Work  Mobile    [place]  Son    [phone]    [place]  Daughter    [phone]    [facility]  Daughter    [phone]      Current Medical History  Patient Admitting Diagnosis: R parietal lobe infarct  History of Present Illness: A 80 y.o. right-handed female with history of CAD with recent NSTEMI, diabetes mellitus and peripheral neuropathy, diastolic congestive heart failure, chronic renal insufficiency with creatinine 2.98, GI bleed and early dementia. Patient well-known rehabilitation services from CVA in May of 2015 with admit date status CIR 02/10/2014 to 02/20/2014. She was discharged to home requiring supervision for transfers and all mobility. She recently been placed on aspirin from both a CVA prophylaxis standpoint and cardiac history. Presented 05/10/2014 with diffuse weakness and shortness of breath. A CBC showed a hemoglobin of 4.7.  Gastroenterology services Dr. Carlean Purl consulted. Colonoscopy completed 05/12/2014 showing a normal colon. Underwent enteroscopy 05/13/2014 with AV malformation found in the proximal jejunum. The remainder of the small bowel is otherwise normal. Patient has been transfused with latest hemoglobin 9.1. An MRI of the head was completed due to lethargy possible right-sided weakness showing 2 areas of acute infarct in the right parietal lobe. Await plan for possible aspirin therapy for CVA prophylaxis. Physical therapy evaluation completed 05/14/2014 with recommendations for a physical medicine rehabilitation consult.  Total: 5=NIH  Past Medical History  Past Medical History   Diagnosis  Date   .  Asthma    .  CAD (coronary artery disease)      Pt reports MI in 2006 (no documentation). Cardiolite in 05/2002 and 07/2006 did not reveal any reversible ischemia. Pt follows with Dr. Rex Kras at [facility].   .  CHF (congestive heart failure)      EF 25-30% with dilated LV, mild LVH, severe hypokinesis, and mod-severe reduction in RV function   .  Osteoporosis    .  HYPERTENSION  08/03/2006   .  GASTROPARESIS, DIABETIC  08/03/2006   .  HYPERLIPIDEMIA  08/03/2006   .  OBSTRUCTIVE SLEEP APNEA  01/06/2008   .  PERIPHERAL NEUROPATHY  08/03/2006   .  GERD  08/03/2006   .  LOW BACK PAIN, CHRONIC  08/03/2006   .  OSTEOPOROSIS  03/21/2009   .  CEREBRAL EMBOLISM, WITH INFARCTION  07/02/2010   .  Angina    .  Myocardial infarction  "2 or 3"   .  Pneumonia  02/26/12     "a few times; probably even today"   .  Shortness of breath      "all the time"   .  DIABETES MELLITUS, TYPE II  11/04/1983   .  Blood transfusion  08/2011   .  Lower GI bleeding  08/2011 and 01/2014   .  Chronic daily headache    .  Migraines    .  Stroke  summer 2011     "made my left hip worse"   .  Uterine cancer    .  Nonischemic cardiomyopathy  05/2010     Left heart catheterization:2011. Nonobstructive coronary artery disease.   .  Pulmonary  hypertension    .  Hypertension    .  NSTEMI (non-ST elevated myocardial infarction)  01/2014     type II with CVA   .  Acute embolic stroke  03/10/4131   .  Non-ST elevation MI (NSTEMI)  02/04/2014   .  E. coli UTI  03/02/2014   .  Renal failure, acute  02/04/2014   .  Iron deficiency anemia secondary to blood loss (chronic)  01/06/2008     Qualifier: Diagnosis of By: Tomasa Hosteller MD, Veronique D.   .  Nonsustained ventricular tachycardia    .  Angiodysplasia of intestine with hemorrhage  05/13/2014    Family History  family history includes Diabetes insipidus in her mother; Hypertension in her child, father, mother, and sister; Stomach cancer in her brother.  Prior Rehab/Hospitalizations: Was on CIR 02/10/14 to 02/20/14 after previous CVA.  Current Medications  Current facility-administered medications:acetaminophen (TYLENOL) tablet 650 mg, 650 mg, Oral, Q6H PRN, Caren Griffins, MD, 650 mg at 05/10/14 1955; albuterol (PROVENTIL) (2.5 MG/3ML) 0.083% nebulizer solution 2.5 mg, 2.5 mg, Nebulization, Q6H PRN, Caren Griffins, MD; atorvastatin (LIPITOR) tablet 40 mg, 40 mg, Oral, q1800, Caren Griffins, MD, 40 mg at 05/14/14 1800  insulin aspart (novoLOG) injection 0-15 Units, 0-15 Units, Subcutaneous, TID WC & HS, Allie Bossier, MD, 3 Units at 05/15/14 [phone]; insulin glargine (LANTUS) injection 8 Units, 8 Units, Subcutaneous, QHS, Hosie Poisson, MD, 8 Units at 05/14/14 2213; isosorbide mononitrate (IMDUR) 24 hr tablet 60 mg, 60 mg, Oral, Daily, Caren Griffins, MD, 60 mg at 05/15/14 1014  lisinopril (PRINIVIL,ZESTRIL) tablet 5 mg, 5 mg, Oral, Daily, Caren Griffins, MD, 5 mg at 05/15/14 1014; magnesium oxide (MAG-OX) tablet 400 mg, 400 mg, Oral, BID, Caren Griffins, MD, 400 mg at 05/15/14 1013; metoprolol succinate (TOPROL-XL) 24 hr tablet 25 mg, 25 mg, Oral, Daily, Costin M Gherghe, MD, 25 mg at 05/15/14 1014; pantoprazole (PROTONIX) EC tablet 40 mg, 40 mg, Oral, QAC breakfast, [name], MD, 40 mg  at 05/15/14 0272  Patients Current Diet: Carb Control  Precautions / Restrictions  Precautions  Precautions: Fall  Restrictions  Weight Bearing Restrictions: No  Prior Activity Level  Household: Mostly homebound. Does go out to MD appts every 2 weeks.  Home Assistive Devices / Equipment  Home Assistive Devices/Equipment: Psychologist, clinical (specify type);Shower chair with back;CBG Meter  Home Equipment: Environmental consultant - 2 wheels;Shower seat  Prior Functional Level  Prior Function  Level of Independence: Needs assistance  Gait / Transfers Assistance Needed: supervision with RW  ADL's / Homemaking Assistance Needed: son reports pt can bath self however also reports there is a caregiver that comes to assist with that as well  Current Functional Level  Cognition  Overall Cognitive Status: Impaired/Different from baseline  Current Attention  Level: Sustained  Orientation Level: Oriented to person;Disoriented to place;Disoriented to time;Disoriented to situation  Following Commands: Follows one step commands with increased time   Extremity Assessment  (includes Sensation/Coordination)  Upper Extremity Assessment: RUE deficits/detail  RUE Deficits / Details: pt with noted R UE drift when bilat UE raised to shld 90 deg flex, grossly 3+/5  Lower Extremity Assessment: RLE deficits/detail  RLE Deficits / Details: grossly 4-/5  Cervical / Trunk Assessment: Normal   ADLs  Anticipate ADL deficits and the need for OT   Mobility  Overal bed mobility: Needs Assistance  Bed Mobility: Supine to Sit  Supine to sit: Min assist  General bed mobility comments: pt initiated transfer and brough self to EOB   Transfers  Overall transfer level: Needs assistance  Equipment used: Rolling walker (2 wheeled)  Transfers: Sit to/from Stand  Sit to Stand: Min assist  General transfer comment: v/c's for safe hand placement, minA to initiate anterior transfer   Ambulation / Gait / Stairs / Wheelchair Mobility   Ambulation/Gait  Ambulation/Gait assistance: Mod assist  Ambulation Distance (Feet): 20 Feet  Assistive device: Rolling walker (2 wheeled)  Gait Pattern/deviations: Step-to pattern;Decreased step length - right;Decreased stance time - right;Shuffle;Narrow base of support  Gait velocity: slow  General Gait Details: pt with + tremors/shaking, son reports this to be at baseline. minA for walker mangement   Posture / Balance  Overall balance assessment: Needs assistance  Sitting-balance support: Feet supported;No upper extremity supported  Sitting balance-Leahy Scale: Fair  Standing balance support: Bilateral upper extremity supported  Standing balance-Leahy Scale: Poor  Standing balance comment: requires RW for safe standing. pt stood x 2 min for hygiene s/p stool incontinence   Special needs/care consideration  BiPAP/CPAP No  CPM No  Continuous Drip IV No  Dialysis No  Life Ve No  Oxygen No  Special Bed No  Trach Size No  Wound Vac (area) No  Skin Itchy skin  Bowel mgmt: Last BM 05/14/14  Bladder mgmt: Currently has a foley catheter  Diabetic mgmt Yes, on insulin at home   Previous Home Environment  Living Arrangements: Children  Available Help at Discharge: Family;Personal care attendant;Available 24 hours/day  Type of Home: House  Home Layout: One level  Home Access: Stairs to enter  Entrance Stairs-Rails: None  Entrance Stairs-Number of Steps: 2  Home Care Services: Yes  Type of Home Care Services: [place] (if known): [place]  Additional Comments: son reports pt to have caregiver that comes M-F 9-1 to assist with dressing/bathing.  Discharge Living Setting  Plans for Discharge Living Setting: Lives with (comment);Apartment (Lives with her son.)  Type of Home at Discharge: Apartment  Discharge Home Layout: One level  Discharge Home Access: Stairs to enter  Entrance Stairs-Number of Steps: 2 steps up to porch and 1 step into apartment.  Does the patient  have any problems obtaining your medications?: No  Social/Family/Support Systems  Patient Roles: Parent (Has 5 daughters and 2 sons.)  Contact Information: See emergency contacts  Anticipated Caregiver: Dulcy Fanny - s0j  Anticipated Caregiver's Contact Information: [name] - [phone]  Ability/Limitations of Caregiver: Son is unemployed and can assist. Patient has caregiver assistance daily as well  Caregiver Availability: 24/7  Discharge Plan Discussed with Primary Caregiver: Yes  Is Caregiver In Agreement with Plan?: Yes  Does Caregiver/Family have Issues with Lodging/Transportation while Pt is in Rehab?: No  Goals/Additional Needs  Patient/Family Goal for Rehab: PT/OT/ST S/Min assist  Expected length  of stay: 10-14 days  Cultural Considerations: None  Dietary Needs: Carb mod med cal, thin liquids  Equipment Needs: TBD  Pt/Family Agrees to Admission and willing to participate: Yes  Program Orientation Provided & Reviewed with Pt/Caregiver Including Roles & Responsibilities: Yes  Decrease burden of Care through IP rehab admission: N/A  Possible need for SNF placement upon discharge: Not anticipated  Patient Condition: This patient's condition remains as documented in the consult dated 05/15/14, in which the Rehabilitation Physician determined and documented that the patient's condition is appropriate for intensive rehabilitative care in an inpatient rehabilitation facility. Will admit to inpatient rehab today.  Preadmission Screen Completed By: Retta Diones, 05/15/2014 11:52 AM  ______________________________________________________________________  Discussed status with Dr. Naaman Plummer on 05/15/14 at 1209 and received telephone approval for admission today.  Admission Coordinator: Retta Diones, time1209/Date08/03/15  Cosigned by: Meredith Staggers, MD [05/15/2014 12:14 PM]

## 2014-05-15 NOTE — Progress Notes (Signed)
Rehab admissions - Evaluated for possible admission.  I met with patient, son and 2 daughters.  They would like inpatient rehab.  They are known to [place] from previous inpatient rehab stay.  I spoke with Ebony Hail with Dr. Thereasa Solo and patient is medically ready for inpatient rehab today.  Bed available and will admit to inpatient rehab today.  Call me for questions.  #[phone]

## 2014-05-15 NOTE — Progress Notes (Signed)
Physical Therapy Treatment Patient Details Name: [name] MRN: [id number] DOB: [birth year] Today's Date: 05/15/2014    History of Present Illness [name] is a 80 y.o. female has a past medical history significant for recent CVA, atrial fibrillation, recent NSTEMI with outpatient evaluation by cardiology during the stress test done yesterday, combined systolic and diastolic heart failure (ejection fraction of 94-50%, grade 2 diastolic dysfunction on 3/88/8280), is being brought to the emergency room 7/29 by her son shortness of breath and weakness.     PT Comments    Patient making slow progress with mobility and gait.  Agree with need for CIR stay prior to return home.  Follow Up Recommendations  CIR     Equipment Recommendations  None recommended by PT    Recommendations for Other Services Rehab consult     Precautions / Restrictions Precautions Precautions: Fall Restrictions Weight Bearing Restrictions: No    Mobility  Bed Mobility Overal bed mobility: Needs Assistance Bed Mobility: Supine to Sit     Supine to sit: Min assist     General bed mobility comments: Patient required assist to raise trunk to sitting, and to scoot to EOB.  Transfers Overall transfer level: Needs assistance Equipment used: Rolling walker (2 wheeled) Transfers: Sit to/from Stand Sit to Stand: Min assist         General transfer comment: v/c's for safe hand placement, minA to initiate anterior transfer.  Assist for balance once standing, with posterior lean.  Ambulation/Gait Ambulation/Gait assistance: Mod assist Ambulation Distance (Feet): 20 Feet Assistive device: Rolling walker (2 wheeled) Gait Pattern/deviations: Step-to pattern;Decreased step length - right;Decreased stride length;Decreased stance time - right;Shuffle;Ataxic;Trunk flexed;Narrow base of support Gait velocity: Very slow Gait velocity interpretation: Below normal speed for age/gender General Gait Details:  Patient with decreased balance and tremors with gait.  Assist to maneuver RW at times, and for balance.  Patient with very slow gait speed.  Cues to try to take longer step with RLE.   Stairs            Wheelchair Mobility    Modified Rankin (Stroke Patients Only) Modified Rankin (Stroke Patients Only) Pre-Morbid Rankin Score: Moderate disability Modified Rankin: Moderately severe disability     Balance Overall balance assessment: Needs assistance Sitting-balance support: No upper extremity supported;Feet supported Sitting balance-Leahy Scale: Fair     Standing balance support: Bilateral upper extremity supported Standing balance-Leahy Scale: Poor Standing balance comment: Requires UE support to maintain balance                    Cognition Arousal/Alertness: Awake/alert Behavior During Therapy: WFL for tasks assessed/performed Overall Cognitive Status: Impaired/Different from baseline Area of Impairment: Orientation;Attention;Memory;Following commands;Safety/judgement;Awareness;Problem solving Orientation Level: Disoriented to;Time;Situation Current Attention Level: Sustained Memory: Decreased short-term memory Following Commands: Follows one step commands with increased time     Problem Solving: Slow processing;Decreased initiation;Difficulty sequencing;Requires verbal cues      Exercises      General Comments        Pertinent Vitals/Pain     Home Living                      Prior Function            PT Goals (current goals can now be found in the care plan section) Progress towards PT goals: Progressing toward goals    Frequency  Min 4X/week    PT Plan Current plan remains appropriate  Co-evaluation             End of Session Equipment Utilized During Treatment: Gait belt Activity Tolerance: Patient limited by fatigue Patient left: in chair;with call bell/phone within reach;with family/visitor present     Time:  8676-1950 PT Time Calculation (min): 24 min  Charges:  $Gait Training: 23-37 mins                    G Codes:      Despina Pole 05-30-2014, 12:22 PM Carita Pian. Sanjuana Kava, [place] Pager [phone]

## 2014-05-15 NOTE — Discharge Summary (Signed)
Physician Discharge Summary  [name] HUT:[id number] DOB: August 30, 1933 DOA: 05/10/2014  PCP: [name], MD  Admit date: 05/10/2014 Discharge date: 05/15/2014  Time spent: >30 minutes  Recommendations for Outpatient Follow-up:  1. Discharge to CIR for continued rehabilitative therapy  2. Repeat anemia panel pending 3. Low dose ASA resumed 8/3 so monitor closely-recommend daily CBC  Discharge Diagnoses:  Active Problems:   DM (diabetes mellitus), type 2, moderately controlled-HgbA1c 7   HYPERLIPIDEMIA   Iron deficiency anemia secondary to blood loss (chronic)   HYPERTENSION   CAD (coronary artery disease)   Memory difficulties   Chronic diastolic CHF EF 65%   NSTEMI (non-ST elevated myocardial infarction)   Atrial fibrillation rate controlled   Anemia   GI bleed   Melena   Acute ischemic stroke   Angiodysplasia of intestine with hemorrhage   Discharge Condition: stable  Diet recommendation: Carbohydrate modified  Filed Weights   05/10/14 0623 05/10/14 1600  Weight: 158 lb (71.668 kg) 155 lb 13.8 oz (70.7 kg)    History of present illness:  80 y.o. female has a past medical history significant for recent CVA, atrial fibrillation, recent NSTEMI. She had subsequently undergone an outpatient evaluation by cardiology with a stress test completed 24 hours prior to presentation. She also has combined systolic and diastolic heart failure (ejection fraction of 68-12%, grade 2 diastolic dysfunction on 7/51/7001). She was brought to the emergency room by her son due to complaints of shortness of breath and weakness with lethargy.   In the ER she was found to be anemic with a hemoglobin of 4.7 (baseline around 8). She has been off anti coagulation secondary to recurrent Gi bleeding. Prior to admission she was on aspirin 81 mg . An MRI was done on admission to evaluate for stroke which showed 2 acute areas of infarct. Neurology and gastroenterology were consulted   Hospital  Course:  Acute encephalopathy; Acute Embolic CVA  -MRI revealed an acute infarct in the right parietal lobe  -Neurology followed:  A) No need for MRA as source of stroke already identified and findings will not change treatment B) Patient has a 10-15% risk of having another stroke over the next year, the highest risk is within 2 weeks of the most recent stroke/TIA (risk of having a stroke following a stroke or TIA is the same). C) Follow up with Dr. Erlinda Hong, Stroke Clinic in 2 months.  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  Combined diastolic and systolic CHF  -has remained compensated  NSTEMI vs demand ischemia  -Patient evaluated as an outpatient by cardiology on 7/28 with nuclear stress test:Low risk stress nuclear study with an inferior wall scar and mildly depressed LV systolic function  -  Has remained CP free.  Stage 4 CKD  -Peak Scr 2.98 and continues to improve  Uncontrolled DM:  -7/30 hemoglobin A1c= 7  -Continue moderate SSI   History atrial fibrillation -currently maintaining NSR -not anticoagulation candidate due to prior GIB   Procedures: 7/31 nuclear stress test;Low risk stress  nuclear study with an inferior wall scar and mildly depressed LV systolic function; KGMW=10%. 7/31 colonoscopy; normal colon  8/1 EGD; AV malformation was found in the proximal jejunum, remainder small bowel enteroscopy exam normal   Consultations: Dr. Silvano Rusk (GI)  Dr.Pramod Leonie Man (neurology stroke team)   Discharge Exam: Filed Vitals:   05/15/14 1100  BP:   Pulse:   Temp: 99.1 F (37.3 C)  Resp:    General: No acute respiratory distress-sitting up in chair conversing with her family  Lungs: Clear to auscultation bilaterally without wheezes or crackles  Cardiovascular: Regular rate and rhythm without murmur gallop or rub normal S1 and S2  Abdomen: Nontender, nondistended, soft, bowel sounds positive, no rebound, no ascites, no appreciable mass  Extremities: No significant cyanosis, clubbing, or edema bilateral lower extremities        Discharge Instructions   Diet Carb Modified    Complete by:  As directed      Increase activity slowly    Complete by:  As directed   Continue PT/OT            Medication List    STOP taking these medications       amLODipine 10 MG tablet  Commonly known as:  NORVASC     KLOR-CON 10 10 MEQ tablet  Generic drug:  potassium chloride     magnesium oxide 400 MG tablet  Commonly known as:  MAG-OX     torsemide 10 MG tablet  Commonly known as:  DEMADEX      TAKE these medications       acetaminophen 500 MG tablet  Commonly known as:  TYLENOL  Take 500 mg by mouth every 6 (six) hours as needed for moderate pain or headache.     albuterol 108 (90 BASE) MCG/ACT inhaler  Commonly known as:  PROAIR HFA  Inhale 2 puffs into the lungs every 6 (six) hours as needed for wheezing.     ALIVE WOMENS 50+ PO  Take 1 tablet by mouth daily.     ALLERGY RELIEF 10 MG tablet  Generic drug:  loratadine  Take 10 mg by mouth daily.     aspirin 81 MG tablet  Take 81 mg by mouth daily.     atorvastatin 40 MG tablet  Commonly known as:   LIPITOR  Take 1 tablet (40 mg total) by mouth daily at 6 PM.     ferrous sulfate 325 (65 FE) MG tablet  Take 1 tablet (325 mg total) by mouth 2 (two) times daily with a meal.     insulin aspart 100 UNIT/ML injection  Commonly known as:  novoLOG  Inject 0-15 Units into the skin 4 (four) times daily -  with meals and at bedtime.     insulin glargine 100 UNIT/ML injection  Commonly known as:  LANTUS  Inject 0.08 mLs (8 Units total) into the skin at bedtime.     isosorbide mononitrate 60 MG 24 hr tablet  Commonly known as:  IMDUR  Take 1 tablet (60 mg total) by mouth daily.     lisinopril 5 MG tablet  Commonly known as:  PRINIVIL,ZESTRIL  Take 5 mg by mouth daily.  magnesium oxide 400 (241.3 MG) MG tablet  Commonly known as:  MAG-OX  Take 1 tablet (400 mg total) by mouth 2 (two) times daily.     metoprolol succinate 25 MG 24 hr tablet  Commonly known as:  TOPROL-XL  Take 1 tablet (25 mg total) by mouth daily.     omeprazole 20 MG capsule  Commonly known as:  PRILOSEC  Take 1 capsule (20 mg total) by mouth daily before breakfast.     polyethylene glycol packet  Commonly known as:  MIRALAX / GLYCOLAX  Take 17 g by mouth daily.       Allergies  Allergen Reactions  . Baking Soda-Fluoride [Sodium Fluoride] Nausea And Vomiting  . Magnesium Hydroxide Nausea And Vomiting   Follow-up Information   Follow up with Xu,Jindong, MD. Schedule an appointment as soon as possible for a visit in 2 months. ([facility]roke [facility], Dr. Erlinda Hong is a stroke MD and Dr. Clydene Fake partner)    Specialty:  Neurology   Contact information:   [address] [place] [address] [phone]      Microbiology: Recent Results (from the past 240 hour(s))  MRSA PCR SCREENING     Status: None   Collection Time    05/10/14  3:52 PM      Result Value Ref Range Status   MRSA by PCR NEGATIVE  NEGATIVE Final   Comment:            The GeneXpert MRSA Assay (FDA     approved for NASAL specimens       only), is one component of a     comprehensive MRSA colonization     surveillance program. It is not     intended to diagnose MRSA     infection nor to guide or     monitor treatment for     MRSA infections.     Labs: Basic Metabolic Panel:  Recent Labs Lab 05/11/14 0426 05/12/14 0338 05/13/14 0249 05/14/14 0530 05/15/14 0254  NA 139 138 136* 135* 134*  K 4.6 4.3 4.1 4.3 4.5  CL 110 108 109 106 104  CO2 16* 18* 18* 18* 17*  GLUCOSE 236* 80 96 100* 169*  BUN 56* 47* 34* 27* 22  CREATININE 2.63* 2.11* 1.80* 1.75* 1.48*  CALCIUM 8.4 8.4 8.1* 8.0* 8.1*  MG 2.1  --  1.7 1.8 1.8   Liver Function Tests:  Recent Labs Lab 05/10/14 0735 05/11/14 0426 05/13/14 0249 05/14/14 0530 05/15/14 0254  AST 73* 76* 35 33 29  ALT 78* 89* 71* 66* 58*  ALKPHOS 185* 140* 120* 114 116  BILITOT 0.4 0.8 0.6 1.1 0.7  PROT 6.8 5.7* 5.6* 5.6* 6.1  ALBUMIN 2.7* 2.2* 2.1* 2.1* 2.1*   No results found for this basename: LIPASE, AMYLASE,  in the last 168 hours  Recent Labs Lab 05/10/14 0738  AMMONIA 29   CBC:  Recent Labs Lab 05/11/14 0426  05/12/14 0338 05/13/14 0249 05/13/14 1450 05/14/14 0530 05/15/14 0254  WBC 11.0*  --  12.2* 9.8  --  9.7 12.1*  NEUTROABS  --   --   --  7.6  --  7.5 10.0*  HGB 7.5*  < > 8.4* 7.5* 7.7* 9.0* 9.1*  HCT 22.9*  < > 26.1* 23.6* 24.2* 28.1* 28.1*  MCV 79.8  --  79.8 80.3  --  82.9 81.4  PLT 368  --  366 321  --  307 339  < > = values in this  interval not displayed.  Cardiac Enzymes:  Recent Labs Lab 05/10/14 1023 05/10/14 1650 05/10/14 2203  TROPONINI 0.57* 0.46* 0.39*   BNP: BNP (last 3 results)  Recent Labs  12/03/13 1808 12/05/13 0448 05/10/14 0736  PROBNP 7767.0* 6023.0* 14464.0*   CBG:  Recent Labs Lab 05/14/14 1150 05/14/14 1542 05/14/14 1922 05/14/14 2200 05/15/14 0740  GLUCAP 166* 214* 182* 172* 187*   Signed:  ELLIS,ALLISON L.  [facility] 05/15/2014, 12:14 PM  I have personally examined this  patient and reviewed the entire database. I have reviewed the above note, made any necessary editorial changes, and agree with its content.  Cherene Altes, MD [facility]

## 2014-05-15 NOTE — H&P (Signed)
Physical Medicine and Rehabilitation Admission H&P  Chief Complaint   Patient presents with   .  Generalized Body Aches   .  Leg Pain   .  Shortness of Breath   :  HPI: [name] is a 80 y.o. right-handed female with history of CAD with recent NSTEMI, diabetes mellitus and peripheral neuropathy, diastolic congestive heart failure, chronic renal insufficiency with creatinine 2.98, GI bleed and early dementia. Patient well-known rehabilitation services from CVA in May of 2015 with admit date status CIR 02/10/2014 to 02/20/2014. She was discharged to home requiring supervision for transfers and all mobility. She recently been placed on aspirin from both a CVA prophylaxis standpoint and cardiac history. Presented 05/10/2014 with diffuse weakness and shortness of breath. A CBC showed a hemoglobin of 4.7. Gastroenterology services Dr. Leone Payor consulted. Colonoscopy completed 05/12/2014 showing a normal colon. Underwent enteroscopy 05/13/2014 with AV malformation found in the proximal jejunum. The remainder of the small bowel is otherwise normal. Patient has been transfused with latest hemoglobin 9.1. An MRI of the head was completed due to lethargy possible right-sided weakness showing 2 areas of acute infarct in the right parietal lobe. Await plan for possible aspirin therapy for CVA prophylaxis and resumed 05/15/2014. Physical therapy evaluation completed 05/14/2014 with recommendations of physical medicine rehabilitation consult. Patient was admitted for comprehensive rehabilitation program  ROS Review of Systems  Unable to perform ROS: language  Past Medical History   Diagnosis  Date   .  Asthma    .  CAD (coronary artery disease)      Pt reports MI in 2006 (no documentation). Cardiolite in 05/2002 and 07/2006 did not reveal any reversible ischemia. Pt follows with Dr. Clarene Duke at [facility].   .  CHF (congestive heart failure)      EF 25-30% with dilated LV, mild LVH, severe hypokinesis, and mod-severe  reduction in RV function   .  Osteoporosis    .  HYPERTENSION  08/03/2006   .  GASTROPARESIS, DIABETIC  08/03/2006   .  HYPERLIPIDEMIA  08/03/2006   .  OBSTRUCTIVE SLEEP APNEA  01/06/2008   .  PERIPHERAL NEUROPATHY  08/03/2006   .  GERD  08/03/2006   .  LOW BACK PAIN, CHRONIC  08/03/2006   .  OSTEOPOROSIS  03/21/2009   .  CEREBRAL EMBOLISM, WITH INFARCTION  07/02/2010   .  Angina    .  Myocardial infarction  "2 or 3"   .  Pneumonia  02/26/12     "a few times; probably even today"   .  Shortness of breath      "all the time"   .  DIABETES MELLITUS, TYPE II  11/04/1983   .  Blood transfusion  08/2011   .  Lower GI bleeding  08/2011 and 01/2014   .  Chronic daily headache    .  Migraines    .  Stroke  summer 2011     "made my left hip worse"   .  Uterine cancer    .  Nonischemic cardiomyopathy  05/2010     Left heart catheterization:2011. Nonobstructive coronary artery disease.   .  Pulmonary hypertension    .  Hypertension    .  NSTEMI (non-ST elevated myocardial infarction)  01/2014     type II with CVA   .  Acute embolic stroke  3/67/7679   .  Non-ST elevation MI (NSTEMI)  02/04/2014   .  E. coli UTI  03/02/2014   .  Renal failure, acute  02/04/2014   .  Iron deficiency anemia secondary to blood loss (chronic)  01/06/2008     Qualifier: Diagnosis of By: Tomasa Hosteller MD, Veronique D.   .  Nonsustained ventricular tachycardia    .  Angiodysplasia of intestine with hemorrhage  05/13/2014    Past Surgical History   Procedure  Laterality  Date   .  Esophagogastroduodenoscopy   08/26/2011     Procedure: ESOPHAGOGASTRODUODENOSCOPY (EGD); Surgeon: [name], MD; Location: [facility] ENDOSCOPY; Service: Endoscopy; Laterality: N/A;   .  Colonoscopy   08/28/2011     Procedure: COLONOSCOPY; Surgeon: [name], MD; Location: [place]; Service: Endoscopy; Laterality: N/A;   .  Vaginal hysterectomy     .  Tubal ligation     .  Cataract extraction w/ intraocular lens implant, bilateral     .  Toe  surgery       "right big toe; operated on it to straighten it out; it was under"   .  Polysomnogram   10/17/2005     AHI-7.28/hr. AHI REM-20.8/hr. Average oxygen saturation range during REM and NREM was 97%. Lowest oxygen saturation during REM sleep was 90%.   .  Carotid duplex   05/28/2010     No significant extracranial carotid artery stenosis demonstrated. Vertebrals are patent w/ antegrade flow.   .  Cardiac catheterization   05/24/2010     No intervention - recommed medical therapy.   .  Cardiovascular stress test   08/07/2006     Moderate-severe defect seen in Basal inferior, Mid inferoseptal, Mid inferior, Mid inferolateral, and Apical inferior regions - consistent w/ infarct/scar. No scintigraphic evidence of inducible myocardial ischemia.   .  Transthoracic echocardiogram   08/29/2011     EF 55-60%, moderate LVH,   .  Esophagogastroduodenoscopy  N/A  02/08/2014     Procedure: ESOPHAGOGASTRODUODENOSCOPY (EGD); Surgeon: [name], MD; Location: [facility] ENDOSCOPY; Service: Endoscopy; Laterality: N/A;   .  Colonoscopy  N/A  05/12/2014     Procedure: COLONOSCOPY; Surgeon: [name], MD; Location: [place]; Service: Endoscopy; Laterality: N/A;    Family History   Problem  Relation  Age of Onset   .  Diabetes insipidus  Mother    .  Hypertension  Mother    .  Hypertension  Father    .  Hypertension  Sister    .  Hypertension  Child    .  Stomach cancer  Brother     Social History: reports that she has never smoked. She quit smokeless tobacco use about 7 years ago. Her smokeless tobacco use included Snuff. She reports that she does not drink alcohol or use illicit drugs.  Allergies:  Allergies   Allergen  Reactions   .  Baking Soda-Fluoride [Sodium Fluoride]  Nausea And Vomiting   .  Magnesium Hydroxide  Nausea And Vomiting    Medications Prior to Admission   Medication  Sig  Dispense  Refill   .  acetaminophen (TYLENOL) 500 MG tablet  Take 500 mg by mouth every 6 (six) hours  as needed for moderate pain or headache.     .  albuterol (PROAIR HFA) 108 (90 BASE) MCG/ACT inhaler  Inhale 2 puffs into the lungs every 6 (six) hours as needed for wheezing.  8.5 g  11   .  amLODipine (NORVASC) 10 MG tablet  Take 1 tablet (10 mg total) by mouth daily.  30 tablet  2   .  aspirin 81 MG tablet  Take 81 mg by mouth daily.     Marland Kitchen  atorvastatin (LIPITOR) 40 MG tablet  Take 1 tablet (40 mg total) by mouth daily at 6 PM.  30 tablet  11   .  ferrous sulfate 325 (65 FE) MG tablet  Take 1 tablet (325 mg total) by mouth 2 (two) times daily with a meal.  60 tablet  5   .  insulin glargine (LANTUS) 100 UNIT/ML injection  Inject 0.1 mLs (10 Units total) into the skin at bedtime.  10 mL  11   .  isosorbide mononitrate (IMDUR) 60 MG 24 hr tablet  Take 1 tablet (60 mg total) by mouth daily.  30 tablet  2   .  KLOR-CON 10 10 MEQ tablet  Take 1 tablet by mouth every other day.     .  lisinopril (PRINIVIL,ZESTRIL) 5 MG tablet  Take 5 mg by mouth daily.     Marland Kitchen  loratadine (ALLERGY RELIEF) 10 MG tablet  Take 10 mg by mouth daily.     .  magnesium oxide (MAG-OX) 400 MG tablet  Take 1 tablet (400 mg total) by mouth 2 (two) times daily. Take 400 mg one tablet two times a day for five days then cut back to once a day after those 5 days  40 tablet  6   .  metoprolol succinate (TOPROL-XL) 25 MG 24 hr tablet  Take 1 tablet (25 mg total) by mouth daily.  30 tablet  0   .  Multiple Vitamins-Minerals (ALIVE WOMENS 50+ PO)  Take 1 tablet by mouth daily.     Marland Kitchen  omeprazole (PRILOSEC) 20 MG capsule  Take 1 capsule (20 mg total) by mouth daily before breakfast.  30 capsule  11   .  polyethylene glycol (MIRALAX / GLYCOLAX) packet  Take 17 g by mouth daily.     Marland Kitchen  torsemide (DEMADEX) 10 MG tablet  Take 5 mg by mouth every other day.      Home:  Home Living  Family/patient expects to be discharged to:: Private residence  Living Arrangements: Children  Available Help at Discharge: Family;Personal care  attendant;Available 24 hours/day  Type of Home: House  Home Access: Stairs to enter  [employer] of Steps: 2  Entrance Stairs-Rails: None  Home Layout: One level  Home Equipment: Walker - 2 wheels;Shower seat  Additional Comments: son reports pt to have caregiver that comes M-F 9-1 to assist with dressing/bathing.  Functional History:  Prior Function  Level of Independence: Needs assistance  Gait / Transfers Assistance Needed: supervision with RW  ADL's / Homemaking Assistance Needed: son reports pt can bath self however also reports there is a caregiver that comes to assist with that as well  Functional Status:  Mobility:  Bed Mobility  Overal bed mobility: Needs Assistance  Bed Mobility: Supine to Sit  Supine to sit: Min assist  General bed mobility comments: pt initiated transfer and brough self to EOB  Transfers  Overall transfer level: Needs assistance  Equipment used: Rolling walker (2 wheeled)  Transfers: Sit to/from Stand  Sit to Stand: Min assist  General transfer comment: v/c's for safe hand placement, minA to initiate anterior transfer  Ambulation/Gait  Ambulation/Gait assistance: Mod assist  Ambulation Distance (Feet): 20 Feet  Assistive device: Rolling walker (2 wheeled)  Gait Pattern/deviations: Step-to pattern;Decreased step length - right;Decreased stance time - right;Shuffle;Narrow base of support  Gait velocity: slow  General Gait Details: pt with +  tremors/shaking, son reports this to be at baseline. minA for walker mangement   ADL:   Cognition:  Cognition  Overall Cognitive Status: Impaired/Different from baseline  Orientation Level: Oriented to person;Disoriented to place;Disoriented to time;Disoriented to situation  Cognition  Arousal/Alertness: Awake/alert  Behavior During Therapy: WFL for tasks assessed/performed  Overall Cognitive Status: Impaired/Different from baseline  Area of Impairment: Orientation;Attention;Memory;Following  commands;Safety/judgement;Awareness;Problem solving  Orientation Level: Disoriented to;Time;Situation (required cuing for date)  Current Attention Level: Sustained  Memory: Decreased short-term memory  Following Commands: Follows one step commands with increased time  Awareness: Intellectual  Problem Solving: Slow processing;Difficulty sequencing;Requires verbal cues;Requires tactile cues  Physical Exam:  Blood pressure 166/60, pulse 73, temperature 99.1 F (37.3 C), temperature source Oral, resp. rate 24, height $RemoveBe'5\' 3"'ivhWhtZQN$  (1.6 m), weight 70.7 kg (155 lb 13.8 oz), last menstrual period 12/19/1968, SpO2 93.00%.   HENT: oral mucosa moist Head: Normocephalic.  Eyes: EOM are normal.  Neck: Normal range of motion. Neck supple. No thyromegaly present.  Cardiovascular:  Cardiac rate controlled  Respiratory: Effort normal and breath sounds normal. No respiratory distress.  GI: Soft. Bowel sounds are normal. She exhibits no distension.  Neurological: She is alert.  Expressive aphasia. She will answer some simple yes no questions but inaccurate. Follows some simple demonstrated commands . Limitations in left sided motor vs right but Manual muscle testing difficult to assess secondary to aphasia and inability to follow commands. Senses pain in all 4's. Decreased truncal control. Skin: Skin is warm and dry  Psych: flat, generally cooperative   Results for orders placed during the hospital encounter of 05/10/14 (from the past 48 hour(s))   HEMOGLOBIN AND HEMATOCRIT, BLOOD Status: Abnormal    Collection Time    05/13/14 2:50 PM   Result  Value  Ref Range    Hemoglobin  7.7 (*)  12.0 - 15.0 g/dL    HCT  24.2 (*)  36.0 - 46.0 %   TYPE AND SCREEN Status: None    Collection Time    05/13/14 2:55 PM   Result  Value  Ref Range    ABO/RH(D)  B POS     Antibody Screen  NEG     Sample Expiration  05/16/2014     Unit Number  [id number]     Blood Component Type  RED CELLS,LR     Unit division  00      Status of Unit  ISSUED,FINAL     Transfusion Status  OK TO TRANSFUSE     Crossmatch Result  Compatible    GLUCOSE, CAPILLARY Status: Abnormal    Collection Time    05/13/14 3:05 PM   Result  Value  Ref Range    Glucose-Capillary  137 (*)  70 - 99 mg/dL   PREPARE RBC (CROSSMATCH) Status: None    Collection Time    05/13/14 6:43 PM   Result  Value  Ref Range    Order Confirmation  ORDER PROCESSED BY BLOOD BANK    GLUCOSE, CAPILLARY Status: Abnormal    Collection Time    05/13/14 7:25 PM   Result  Value  Ref Range    Glucose-Capillary  145 (*)  70 - 99 mg/dL    Comment 1  Notify RN    GLUCOSE, CAPILLARY Status: Abnormal    Collection Time    05/13/14 11:49 PM   Result  Value  Ref Range    Glucose-Capillary  190 (*)  70 - 99 mg/dL    Comment 1  Notify RN    GLUCOSE, CAPILLARY Status: Abnormal    Collection Time    05/14/14 3:43 AM   Result  Value  Ref Range    Glucose-Capillary  125 (*)  70 - 99 mg/dL    Comment 1  Notify RN    CBC WITH DIFFERENTIAL Status: Abnormal    Collection Time    05/14/14 5:30 AM   Result  Value  Ref Range    WBC  9.7  4.0 - 10.5 K/uL    RBC  3.39 (*)  3.87 - 5.11 MIL/uL    Hemoglobin  9.0 (*)  12.0 - 15.0 g/dL    HCT  28.1 (*)  36.0 - 46.0 %    MCV  82.9  78.0 - 100.0 fL    MCH  26.5  26.0 - 34.0 pg    MCHC  32.0  30.0 - 36.0 g/dL    RDW  16.4 (*)  11.5 - 15.5 %    Platelets  307  150 - 400 K/uL    Neutrophils Relative %  78 (*)  43 - 77 %    Neutro Abs  7.5  1.7 - 7.7 K/uL    Lymphocytes Relative  11 (*)  12 - 46 %    Lymphs Abs  1.1  0.7 - 4.0 K/uL    Monocytes Relative  10  3 - 12 %    Monocytes Absolute  1.0  0.1 - 1.0 K/uL    Eosinophils Relative  1  0 - 5 %    Eosinophils Absolute  0.1  0.0 - 0.7 K/uL    Basophils Relative  0  0 - 1 %    Basophils Absolute  0.0  0.0 - 0.1 K/uL   COMPREHENSIVE METABOLIC PANEL Status: Abnormal    Collection Time    05/14/14 5:30 AM   Result  Value  Ref Range    Sodium  135 (*)  137 - 147 mEq/L     Potassium  4.3  3.7 - 5.3 mEq/L    Chloride  106  96 - 112 mEq/L    CO2  18 (*)  19 - 32 mEq/L    Glucose, Bld  100 (*)  70 - 99 mg/dL    BUN  27 (*)  6 - 23 mg/dL    Creatinine, Ser  1.75 (*)  0.50 - 1.10 mg/dL    Calcium  8.0 (*)  8.4 - 10.5 mg/dL    Total Protein  5.6 (*)  6.0 - 8.3 g/dL    Albumin  2.1 (*)  3.5 - 5.2 g/dL    AST  33  0 - 37 U/L    ALT  66 (*)  0 - 35 U/L    Alkaline Phosphatase  114  39 - 117 U/L    Total Bilirubin  1.1  0.3 - 1.2 mg/dL    GFR calc non Af Amer  27 (*)  >90 mL/min    GFR calc Af Amer  31 (*)  >90 mL/min    Comment:  (NOTE)     The eGFR has been calculated using the CKD EPI equation.     This calculation has not been validated in all clinical situations.     eGFR's persistently <90 mL/min signify possible Chronic Kidney     Disease.    Anion gap  11  5 - 15   MAGNESIUM Status: None    Collection Time    05/14/14 5:30  AM   Result  Value  Ref Range    Magnesium  1.8  1.5 - 2.5 mg/dL   GLUCOSE, CAPILLARY Status: None    Collection Time    05/14/14 8:15 AM   Result  Value  Ref Range    Glucose-Capillary  87  70 - 99 mg/dL    Comment 1  Documented in Chart     Comment 2  Notify RN    GLUCOSE, CAPILLARY Status: Abnormal    Collection Time    05/14/14 11:50 AM   Result  Value  Ref Range    Glucose-Capillary  166 (*)  70 - 99 mg/dL    Comment 1  Documented in Chart     Comment 2  Notify RN    GLUCOSE, CAPILLARY Status: Abnormal    Collection Time    05/14/14 3:42 PM   Result  Value  Ref Range    Glucose-Capillary  214 (*)  70 - 99 mg/dL    Comment 1  Documented in Chart     Comment 2  Notify RN    GLUCOSE, CAPILLARY Status: Abnormal    Collection Time    05/14/14 7:22 PM   Result  Value  Ref Range    Glucose-Capillary  182 (*)  70 - 99 mg/dL    Comment 1  Notify RN    GLUCOSE, CAPILLARY Status: Abnormal    Collection Time    05/14/14 10:00 PM   Result  Value  Ref Range    Glucose-Capillary  172 (*)  70 - 99 mg/dL    Comment 1   Notify RN    CBC WITH DIFFERENTIAL Status: Abnormal    Collection Time    05/15/14 2:54 AM   Result  Value  Ref Range    WBC  12.1 (*)  4.0 - 10.5 K/uL    RBC  3.45 (*)  3.87 - 5.11 MIL/uL    Hemoglobin  9.1 (*)  12.0 - 15.0 g/dL    HCT  28.1 (*)  36.0 - 46.0 %    MCV  81.4  78.0 - 100.0 fL    MCH  26.4  26.0 - 34.0 pg    MCHC  32.4  30.0 - 36.0 g/dL    RDW  16.9 (*)  11.5 - 15.5 %    Platelets  339  150 - 400 K/uL    Neutrophils Relative %  83 (*)  43 - 77 %    Neutro Abs  10.0 (*)  1.7 - 7.7 K/uL    Lymphocytes Relative  10 (*)  12 - 46 %    Lymphs Abs  1.2  0.7 - 4.0 K/uL    Monocytes Relative  6  3 - 12 %    Monocytes Absolute  0.8  0.1 - 1.0 K/uL    Eosinophils Relative  1  0 - 5 %    Eosinophils Absolute  0.1  0.0 - 0.7 K/uL    Basophils Relative  0  0 - 1 %    Basophils Absolute  0.0  0.0 - 0.1 K/uL   COMPREHENSIVE METABOLIC PANEL Status: Abnormal    Collection Time    05/15/14 2:54 AM   Result  Value  Ref Range    Sodium  134 (*)  137 - 147 mEq/L    Potassium  4.5  3.7 - 5.3 mEq/L    Chloride  104  96 - 112 mEq/L    CO2  17 (*)  19 - 32 mEq/L    Glucose, Bld  169 (*)  70 - 99 mg/dL    BUN  22  6 - 23 mg/dL    Creatinine, Ser  5.50 (*)  0.50 - 1.10 mg/dL    Calcium  8.1 (*)  8.4 - 10.5 mg/dL    Total Protein  6.1  6.0 - 8.3 g/dL    Albumin  2.1 (*)  3.5 - 5.2 g/dL    AST  29  0 - 37 U/L    ALT  58 (*)  0 - 35 U/L    Alkaline Phosphatase  116  39 - 117 U/L    Total Bilirubin  0.7  0.3 - 1.2 mg/dL    GFR calc non Af Amer  33 (*)  >90 mL/min    GFR calc Af Amer  38 (*)  >90 mL/min    Comment:  (NOTE)     The eGFR has been calculated using the CKD EPI equation.     This calculation has not been validated in all clinical situations.     eGFR's persistently <90 mL/min signify possible Chronic Kidney     Disease.    Anion gap  13  5 - 15   MAGNESIUM Status: None    Collection Time    05/15/14 2:54 AM   Result  Value  Ref Range    Magnesium  1.8  1.5 - 2.5 mg/dL     GLUCOSE, CAPILLARY Status: Abnormal    Collection Time    05/15/14 7:40 AM   Result  Value  Ref Range    Glucose-Capillary  187 (*)  70 - 99 mg/dL    Comment 1  Notify RN     Comment 2  Documented in Chart     No results found.  Medical Problem List and Plan:  1. Functional deficits secondary to right parietal lobe infarct as well as history of CVA May of 2015  2. DVT Prophylaxis/Anticoagulation: SCDs. Monitor for any signs of DVT  3. Pain Management: Tylenol as needed  4. Mood/dementia: Patient appears at baseline  5. Neuropsych: This patient is not capable of making decisions on her own behalf.  6. Skin/Wound Care: Routine skin checks  7. GI bleed/AV malformation. Followup gastroscopy services. Monitor CBC. Continue Protonix daily  8. Diabetes mellitus with peripheral neuropathy. Hemoglobin A1c 7.0. Lantus insulin 8 units daily. Check CBGs a.c. and at bedtime  9. Hypertension. Toprol-XL 25 mg daily, lisinopril 5 mg daily, Imdur 60 mg daily. Monitor with increased mobility  10. Hyperlipidemia. Lipitor  11. Chronic renal insufficiency. Baseline creatinine 2.98. Followup chemistries  12. Diastolic congestive heart failure. Monitor for any signs of fluid overload    Post Admission Physician Evaluation:  1. Functional deficits secondary to right parietal lobe infarct (previous CVA). 2. Patient is admitted to receive collaborative, interdisciplinary care between the physiatrist, rehab nursing staff, and therapy team. 3. Patient's level of medical complexity and substantial therapy needs in context of that medical necessity cannot be provided at a lesser intensity of care such as a SNF. 4. Patient has experienced substantial functional loss from his/her baseline which was documented above under the "Functional History" and "Functional Status" headings. Judging by the patient's diagnosis, physical exam, and functional history, the patient has potential for functional progress which will  result in measurable gains while on inpatient rehab. These gains will be of substantial and practical use upon discharge in facilitating mobility and self-care at the household level.  5. Physiatrist will provide 24 hour management of medical needs as well as oversight of the therapy plan/treatment and provide guidance as appropriate regarding the interaction of the two. 6. 24 hour rehab nursing will assist with bladder management, bowel management, safety, skin/wound care, disease management, medication administration, pain management and patient education and help integrate therapy concepts, techniques,education, etc. 7. PT will assess and treat for/with: Lower extremity strength, range of motion, stamina, balance, functional mobility, safety, adaptive techniques and equipment, NMR, cognitive perceptual awareness, family education. Goals are: supervision. 8. OT will assess and treat for/with: ADL's, functional mobility, safety, upper extremity strength, adaptive techniques and equipment, NMR, cognitive perceptual awareness, leisure skills, family ed. Goals are: supervision. 9. SLP will assess and treat for/with: cognition, communication. Goals are: min assist. 10. Case Management and Social Worker will assess and treat for psychological issues and discharge planning. 11. Team conference will be held weekly to assess progress toward goals and to determine barriers to discharge. 12. Patient will receive at least 3 hours of therapy per day at least 5 days per week. 13. ELOS: 8-11 days  14. Prognosis: good  Meredith Staggers, MD, [facility]   05/15/2014

## 2014-05-15 NOTE — Progress Notes (Signed)
Received pre-screen request for inpatient rehab and noted that rehab MD consult has already been placed. We will await completion of rehab consult and an admission coordinator will follow up. Thanks.  Mitch Arquette, PT Rehabilitation Admissions Coordinator [phone]  

## 2014-05-15 NOTE — Progress Notes (Signed)
OT Cancellation Note  Patient Details Name: [name] MRN: [id number] DOB: 07-17-1933   Cancelled Treatment:    Reason Eval/Treat Not Completed: Other (comment) Defer OT to CIR. [place], OTR/L  [phone] 05/15/2014 05/15/2014, 2:02 PM

## 2014-05-16 ENCOUNTER — Inpatient Hospital Stay (HOSPITAL_COMMUNITY): Payer: Self-pay | Admitting: Speech Pathology

## 2014-05-16 ENCOUNTER — Inpatient Hospital Stay (HOSPITAL_COMMUNITY): Payer: Medicare Other | Admitting: *Deleted

## 2014-05-16 ENCOUNTER — Inpatient Hospital Stay (HOSPITAL_COMMUNITY): Payer: Medicare Other | Admitting: Occupational Therapy

## 2014-05-16 DIAGNOSIS — I10 Essential (primary) hypertension: Secondary | ICD-10-CM

## 2014-05-16 DIAGNOSIS — Z5189 Encounter for other specified aftercare: Secondary | ICD-10-CM

## 2014-05-16 DIAGNOSIS — I6330 Cerebral infarction due to thrombosis of unspecified cerebral artery: Secondary | ICD-10-CM

## 2014-05-16 DIAGNOSIS — I5032 Chronic diastolic (congestive) heart failure: Secondary | ICD-10-CM

## 2014-05-16 DIAGNOSIS — E1165 Type 2 diabetes mellitus with hyperglycemia: Secondary | ICD-10-CM

## 2014-05-16 DIAGNOSIS — IMO0001 Reserved for inherently not codable concepts without codable children: Secondary | ICD-10-CM

## 2014-05-16 LAB — GLUCOSE, CAPILLARY
GLUCOSE-CAPILLARY: 72 mg/dL (ref 70–99)
GLUCOSE-CAPILLARY: 226 mg/dL — AB (ref 70–99)
Glucose-Capillary: 155 mg/dL — ABNORMAL HIGH (ref 70–99)
Glucose-Capillary: 210 mg/dL — ABNORMAL HIGH (ref 70–99)

## 2014-05-16 LAB — CBC WITH DIFFERENTIAL/PLATELET
Basophils Absolute: 0 10*3/uL (ref 0.0–0.1)
Basophils Relative: 0 % (ref 0–1)
Eosinophils Absolute: 0.2 10*3/uL (ref 0.0–0.7)
Eosinophils Relative: 1 % (ref 0–5)
HCT: 33.8 % — ABNORMAL LOW (ref 36.0–46.0)
Hemoglobin: 10.5 g/dL — ABNORMAL LOW (ref 12.0–15.0)
LYMPHS ABS: 0.6 10*3/uL — AB (ref 0.7–4.0)
Lymphocytes Relative: 5 % — ABNORMAL LOW (ref 12–46)
MCH: 26.3 pg (ref 26.0–34.0)
MCHC: 31.1 g/dL (ref 30.0–36.0)
MCV: 84.7 fL (ref 78.0–100.0)
Monocytes Absolute: 0.8 10*3/uL (ref 0.1–1.0)
Monocytes Relative: 6 % (ref 3–12)
NEUTROS ABS: 11.2 10*3/uL — AB (ref 1.7–7.7)
NEUTROS PCT: 88 % — AB (ref 43–77)
PLATELETS: 316 10*3/uL (ref 150–400)
RBC: 3.99 MIL/uL (ref 3.87–5.11)
RDW: 16.7 % — ABNORMAL HIGH (ref 11.5–15.5)
WBC: 12.9 10*3/uL — ABNORMAL HIGH (ref 4.0–10.5)

## 2014-05-16 LAB — COMPREHENSIVE METABOLIC PANEL
ALBUMIN: 2.1 g/dL — AB (ref 3.5–5.2)
ALK PHOS: 110 U/L (ref 39–117)
ALT: 42 U/L — ABNORMAL HIGH (ref 0–35)
ANION GAP: 14 (ref 5–15)
AST: 19 U/L (ref 0–37)
BILIRUBIN TOTAL: 0.8 mg/dL (ref 0.3–1.2)
BUN: 20 mg/dL (ref 6–23)
CO2: 18 meq/L — AB (ref 19–32)
CREATININE: 1.53 mg/dL — AB (ref 0.50–1.10)
Calcium: 8.5 mg/dL (ref 8.4–10.5)
Chloride: 108 mEq/L (ref 96–112)
GFR, EST AFRICAN AMERICAN: 36 mL/min — AB (ref >90)
GFR, EST NON AFRICAN AMERICAN: 31 mL/min — AB (ref >90)
Glucose, Bld: 76 mg/dL (ref 70–99)
Potassium: 4.9 mEq/L (ref 3.7–5.3)
Sodium: 140 mEq/L (ref 137–147)
Total Protein: 6.3 g/dL (ref 6.0–8.3)

## 2014-05-16 LAB — IRON AND TIBC
IRON: 68 ug/dL (ref 42–135)
SATURATION RATIOS: 24 % (ref 20–55)
TIBC: 287 ug/dL (ref 250–470)
UIBC: 219 ug/dL (ref 125–400)

## 2014-05-16 LAB — FERRITIN: Ferritin: 194 ng/mL (ref 10–291)

## 2014-05-16 LAB — FOLATE: Folate: >20 ng/mL

## 2014-05-16 LAB — VITAMIN B12: Vitamin B-12: 1753 pg/mL — ABNORMAL HIGH (ref 211–911)

## 2014-05-16 MED ORDER — GLUCERNA SHAKE PO LIQD
237.0000 mL | Freq: Three times a day (TID) | ORAL | Status: DC
  Administered 2014-05-16 – 2014-05-25 (×23): 237 mL via ORAL

## 2014-05-16 NOTE — Progress Notes (Addendum)
Admission skin assessment performed by Algis Liming, PA and Elliot Cousin, CRRN, WTA.  Skin C.D.I.. No redness, no s/s infection.  Continue care plan as initiated.

## 2014-05-16 NOTE — Progress Notes (Signed)
Patient information reviewed and entered into eRehab system by Camil Hausmann, RN, CRRN, PPS Coordinator.  Information including medical coding and functional independence measure will be reviewed and updated through discharge.     Per nursing patient was given "Data Collection Information Summary for Patients in Inpatient Rehabilitation Facilities with attached "Privacy Act Statement-Health Care Records" upon admission.  

## 2014-05-16 NOTE — Evaluation (Signed)
Occupational Therapy Assessment and Plan and Therapeutic Intervention  Patient Details  Name: [name] MRN: [id number] Date of Birth: [birth year]  OT Diagnosis: apraxia, ataxia, cognitive deficits, disturbance of vision, hemiplegia affecting non-dominant side and muscle weakness (generalized) Rehab Potential: Rehab Potential: Good ELOS: 10 days   Today's Date: 05/16/2014 Time: 0903-1003 Time Calculation (min): 60 min  Problem List:  Patient Active Problem List   Diagnosis Date Noted  . Angiodysplasia of intestine with hemorrhage 05/13/2014  . Acute ischemic stroke 05/11/2014  . Anemia 05/10/2014  . GI bleed 05/10/2014  . Melena 05/10/2014  . Ataxia, late effect of cerebrovascular disease 03/23/2014  . Atrial fibrillation 03/02/2014  . CVA (cerebral infarction) 02/10/2014  . Erosive gastritis with hemorrhage 02/08/2014  . Acute bilat watershed infarction vs. embolic strokes 27/78/2423  . Chronic diastolic CHF EF 53% 61/44/3154  . NSTEMI (non-ST elevated myocardial infarction) 02/04/2014  . Nonischemic cardiomyopathy 12/12/2013  . ARF (acute renal failure) 12/05/2013  . Memory difficulties 12/04/2013  . Pulmonary edema 12/03/2013  . Acute diastolic CHF (congestive heart failure) 12/03/2013  . Irregular heart rhythm 02/26/2012  . Ventricular bigeminy 02/26/2012  . CAD (coronary artery disease)   . CEREBRAL EMBOLISM, WITH INFARCTION 07/02/2010  . OSTEOPOROSIS 03/21/2009  . DEGENERATIVE JOINT DISEASE 07/24/2008  . INSOMNIA-SLEEP DISORDER-UNSPEC 07/24/2008  . Iron deficiency anemia secondary to blood loss (chronic) 01/06/2008  . OBSTRUCTIVE SLEEP APNEA 01/06/2008  . GASTROPARESIS, DIABETIC 08/03/2006  . HYPERLIPIDEMIA 08/03/2006  . PERIPHERAL NEUROPATHY 08/03/2006  . HYPERTENSION 08/03/2006  . Chronic diastolic congestive heart failure 08/03/2006  . REACTIVE AIRWAY DISEASE 08/03/2006  . GERD 08/03/2006  . DM (diabetes mellitus), type 2, uncontrolled 11/04/1983    Past  Medical History:  Past Medical History  Diagnosis Date  . Asthma   . CAD (coronary artery disease)     Pt reports MI in 2006 (no documentation).  Cardiolite in 05/2002 and 07/2006 did not reveal any reversible ischemia.  Pt follows with Dr. Rex Kras at [facility].  . CHF (congestive heart failure)     EF 25-30% with dilated LV, mild LVH, severe hypokinesis, and mod-severe reduction in RV function  . Osteoporosis   . HYPERTENSION 08/03/2006  . GASTROPARESIS, DIABETIC 08/03/2006  . HYPERLIPIDEMIA 08/03/2006  . OBSTRUCTIVE SLEEP APNEA 01/06/2008  . PERIPHERAL NEUROPATHY 08/03/2006  . GERD 08/03/2006  . LOW BACK PAIN, CHRONIC 08/03/2006  . OSTEOPOROSIS 03/21/2009  . CEREBRAL EMBOLISM, WITH INFARCTION 07/02/2010  . Angina   . Myocardial infarction "2 or 3"  . Pneumonia 02/26/12    "a few times; probably even today"  . Shortness of breath     "all the time"  . DIABETES MELLITUS, TYPE II 11/04/1983  . Blood transfusion 08/2011  . Lower GI bleeding 08/2011 and 01/2014  . Chronic daily headache   . Migraines   . Stroke summer 2011    "made my left hip worse"  . Uterine cancer   . Nonischemic cardiomyopathy 05/2010    Left heart catheterization:2011. Nonobstructive coronary artery disease.  . Pulmonary hypertension   . Hypertension   . NSTEMI (non-ST elevated myocardial infarction) 01/2014    type II with CVA  . Acute embolic stroke 0/05/6760  . Non-ST elevation MI (NSTEMI) 02/04/2014  . E. coli UTI 03/02/2014  . Renal failure, acute 02/04/2014  . Iron deficiency anemia secondary to blood loss (chronic) 01/06/2008    Qualifier: Diagnosis of  By: Tomasa Hosteller MD, Veronique D.   . Nonsustained ventricular tachycardia   . Angiodysplasia of intestine with  hemorrhage 05/13/2014   Past Surgical History:  Past Surgical History  Procedure Laterality Date  . Esophagogastroduodenoscopy  08/26/2011    Procedure: ESOPHAGOGASTRODUODENOSCOPY (EGD);  Surgeon: Iva Boop, MD;  Location: [facility] ENDOSCOPY;  Service:  Endoscopy;  Laterality: N/A;  . Colonoscopy  08/28/2011    Procedure: COLONOSCOPY;  Surgeon: Iva Boop, MD;  Location: [facility] ENDOSCOPY;  Service: Endoscopy;  Laterality: N/A;  . Vaginal hysterectomy    . Tubal ligation    . Cataract extraction w/ intraocular lens  implant, bilateral    . Toe surgery      "right big toe; operated on it to straighten it out; it was under"  . Polysomnogram  10/17/2005    AHI-7.28/hr. AHI REM-20.8/hr. Average oxygen saturation range during REM and NREM was 97%. Lowest oxygen saturation during REM sleep was 90%.  . Carotid duplex  05/28/2010    No significant extracranial carotid artery stenosis demonstrated. Vertebrals are patent w/ antegrade flow.  . Cardiac catheterization  05/24/2010    No intervention - recommed medical therapy.  . Cardiovascular stress test  08/07/2006    Moderate-severe defect seen in Basal inferior, Mid inferoseptal, Mid inferior, Mid inferolateral, and Apical inferior regions - consistent w/ infarct/scar. No scintigraphic evidence of inducible myocardial ischemia.  . Transthoracic echocardiogram  08/29/2011    EF 55-60%, moderate LVH,   . Esophagogastroduodenoscopy N/A 02/08/2014    Procedure: ESOPHAGOGASTRODUODENOSCOPY (EGD);  Surgeon: Iva Boop, MD;  Location: [facility] ENDOSCOPY;  Service: Endoscopy;  Laterality: N/A;  . Colonoscopy N/A 05/12/2014    Procedure: COLONOSCOPY;  Surgeon: Iva Boop, MD;  Location: [facility] ENDOSCOPY;  Service: Endoscopy;  Laterality: N/A;  . Enteroscopy N/A 05/13/2014    Procedure: ENTEROSCOPY;  Surgeon: Iva Boop, MD;  Location: [facility] ENDOSCOPY;  Service: Endoscopy;  Laterality: N/A;    Assessment & Plan Clinical Impression: [name] is a 80 y.o. right-handed female with history of CAD with recent NSTEMI, diabetes mellitus and peripheral neuropathy, diastolic congestive heart failure, chronic renal insufficiency with creatinine 2.98, GI bleed and early dementia. Patient well-known rehabilitation services  from CVA in May of 2015 with admit date status CIR 02/10/2014 to 02/20/2014. She was discharged to home requiring supervision for transfers and all mobility. She recently been placed on aspirin from both a CVA prophylaxis standpoint and cardiac history. Presented 05/10/2014 with diffuse weakness and shortness of breath. A CBC showed a hemoglobin of 4.7. Gastroenterology services Dr. Leone Payor consulted. Colonoscopy completed 05/12/2014 showing a normal colon. Underwent enteroscopy 05/13/2014 with AV malformation found in the proximal jejunum. The remainder of the small bowel is otherwise normal. Patient has been transfused with latest hemoglobin 9.1. An MRI of the head was completed due to lethargy possible right-sided weakness showing 2 areas of acute infarct in the right parietal lobe. Await plan for possible aspirin therapy for CVA prophylaxis and resumed 05/15/2014. Physical therapy evaluation completed 05/14/2014 with recommendations of physical medicine rehabilitation consult. Patient was admitted for comprehensive rehabilitation program   Patient transferred to CIR on 05/15/2014 .    Patient currently requires mod with basic self-care skills secondary to muscle weakness, decreased cardiorespiratoy endurance, motor apraxia and ataxia, decreased visual perceptual skills and decreased visual motor skills, decreased attention to left, decreased initiation, decreased attention, decreased awareness, decreased problem solving, decreased safety awareness, decreased memory and delayed processing and decreased standing balance and decreased balance strategies.  Prior to hospitalization, patient could complete basic ADLs with supervision.  Patient will benefit from skilled intervention to  increase independence with basic self-care skills prior to discharge home with care partner.  Anticipate patient will require 24 hour supervision and follow up home health.  OT Assessment Rehab Potential: Good OT Patient  demonstrates impairments in the following area(s): Balance;Cognition;Endurance;Motor;Sensory;Safety;Perception;Vision OT Basic ADL's Functional Problem(s): Grooming;Bathing;Dressing;Toileting OT Transfers Functional Problem(s): Toilet;Tub/Shower OT Plan OT Intensity: Minimum of 1-2 x/day, 45 to 90 minutes OT Frequency: 5 out of 7 days OT Duration/Estimated Length of Stay: 10 days OT Treatment/Interventions: Balance/vestibular training;Cognitive remediation/compensation;Discharge planning;DME/adaptive equipment instruction;Functional mobility training;Patient/family education;Self Care/advanced ADL retraining;Therapeutic Activities;Therapeutic Exercise;UE/LE Strength taining/ROM;UE/LE Coordination activities;Visual/perceptual remediation/compensation OT Self Feeding Anticipated Outcome(s): set up OT Basic Self-Care Anticipated Outcome(s): supervision OT Toileting Anticipated Outcome(s): supervision OT Bathroom Transfers Anticipated Outcome(s): supervision OT Recommendation Patient destination: Home Follow Up Recommendations: Home health OT Equipment Recommended: None recommended by OT   Skilled Therapeutic Intervention Pt seen for initial evaluation and ADL retraining. OT goals and POC explained to pt and family. Their goals are for pt to return to a S level using her RW. Pt was able to walk to bathroom with min-mod A with pt's BUE supported on therapists arms. Pt needed mod A overall due to apraxia, limited comprehension, motor perseveration, perceptual deficits. Pt had low endurance, but did participate well. Pt resting in w/c with QRB on at end of session with family in the room. Pt demonstrated that she was able to use the call light to contact nursing.  OT Evaluation Precautions/Restrictions  Precautions Precautions: Fall Restrictions Weight Bearing Restrictions: No   Vital Signs Therapy Vitals Pulse Rate: 75 BP: 176/75 mmHg Patient Position (if appropriate): Sitting Pain Pain  Assessment Pain Assessment: No/denies pain Home Living/Prior Functioning Home Living Family/patient expects to be discharged to:: Private residence Living Arrangements: Children Available Help at Discharge: Family;Personal care attendant;Available 24 hours/day Type of Home: House Home Access: Stairs to enter [employer] of Steps: 1+1 Entrance Stairs-Rails: None Home Layout: One level Additional Comments: son reports pt to have caregiver that comes M-F 9-1 to assist with dressing/bathing.  Lives With: Son Prior Function Level of Independence: Requires assistive device for independence;Other (comment)  Able to Take Stairs?: Yes Driving: No Vocation: Retired Comments: as per daughter at bedside, pt was transfering and ambulating with rolling walker and S, however has had several falls recently ADL ADL ADL Comments: refer to FIM Vision/Perception  Vision- History Patient Visual Report: No change from baseline Vision- Assessment Eye Alignment: Within Functional Limits Additional Comments: Unable to fully assess due to cognition Praxis Praxis-Other Comments: mod impaired with functional mobility and self care  Cognition Overall Cognitive Status: Impaired/Different from baseline Arousal/Alertness: Awake/alert Orientation Level: Oriented to person;Disoriented to place;Disoriented to time;Disoriented to situation Attention: Selective Selective Attention: Impaired Selective Attention Impairment: Functional basic Memory: Impaired Awareness: Impaired Awareness Impairment: Intellectual impairment;Emergent impairment;Anticipatory impairment Problem Solving: Impaired Problem Solving Impairment: Verbal complex Safety/Judgment: Impaired Sensation Sensation Light Touch: Impaired by gross assessment Stereognosis: Appears Intact Hot/Cold: Appears Intact Proprioception: Appears Intact Coordination Gross Motor Movements are Fluid and Coordinated: No Fine Motor Movements are  Fluid and Coordinated: No Heel Shin Test: Decreased excursion Motor  Motor Motor: Hemiplegia;Motor impersistence;Motor perseverations Motor - Skilled Clinical Observations: Mild L-sided weakness Mobility     Trunk/Postural Assessment  Lumbar AROM Overall Lumbar AROM Comments: PPT and rigidity Postural Control Postural Control: Deficits on evaluation Righting Reactions: Delayed Protective Responses: Insufficient  Balance Dynamic Sitting Balance Dynamic Sitting - Balance Support: Right upper extremity supported;Left upper extremity supported;Feet unsupported Dynamic Sitting - Level of Assistance: 4: Min  assist Dynamic Sitting Balance - Compensations: Heavy use of UEs for compensation Static Standing Balance Static Standing - Balance Support: Bilateral upper extremity supported;During functional activity Static Standing - Level of Assistance: 4: Min assist Dynamic Standing Balance Dynamic Standing - Balance Support: During functional activity;Bilateral upper extremity supported Dynamic Standing - Level of Assistance: 3: Mod assist Dynamic Standing - Balance Activities: Lateral lean/weight shifting;Forward lean/weight shifting;Reaching for objects;Reaching across midline Dynamic Standing - Comments: tremors in standing Extremity/Trunk Assessment RUE Assessment RUE Assessment: Exceptions to [facility] RUE Strength RUE Overall Strength Comments: Gen weakness LUE Assessment LUE Assessment: Exceptions to [facility] LUE Strength LUE Overall Strength Comments: Gen weakness  FIM:  FIM - Grooming Grooming Steps: Wash, rinse, dry face;Wash, rinse, dry hands Grooming: 4: Patient completes 3 of 4 or 4 of 5 steps FIM - Bathing Bathing Steps Patient Completed: Chest;Right Arm;Left Arm;Abdomen;Buttocks;Front perineal area;Right upper leg;Left upper leg Bathing: 4: Min-Patient completes 8-9 75f 10 parts or 75+ percent FIM - Upper Body Dressing/Undressing Upper body dressing/undressing steps patient  completed: Thread/unthread right sleeve of pullover shirt/dresss;Thread/unthread left sleeve of pullover shirt/dress;Put head through opening of pull over shirt/dress Upper body dressing/undressing: 4: Min-Patient completed 75 plus % of tasks FIM - Lower Body Dressing/Undressing Lower body dressing/undressing steps patient completed: Thread/unthread right pants leg;Thread/unthread left pants leg;Don/Doff right sock;Don/Doff right shoe;Fasten/unfasten right shoe Lower body dressing/undressing: 3: Mod-Patient completed 50-74% of tasks FIM - Toileting Toileting steps completed by patient: Performs perineal hygiene Toileting: 2: Max-Patient completed 1 of 3 steps FIM - Control and instrumentation engineer Devices: Arm rests Bed/Chair Transfer: 5: Supine > Sit: Supervision (verbal cues/safety issues);2: Bed > Chair or W/C: Max A (lift and lower assist);3: Chair or W/C > Bed: Mod A (lift or lower assist) FIM - Radio producer Devices: Grab bars Toilet Transfers: 4-To toilet/BSC: Min A (steadying Pt. > 75%);4-From toilet/BSC: Min A (steadying Pt. > 75%) FIM - Tub/Shower Transfers Tub/Shower Assistive Devices: Tub transfer bench;Grab bars Tub/shower Transfers: 4-Into Tub/Shower: Min A (steadying Pt. > 75%/lift 1 leg);4-Out of Tub/Shower: Min A (steadying Pt. > 75%/lift 1 leg)   Refer to Care Plan for Long Term Goals  Recommendations for other services: None  Discharge Criteria: Patient will be discharged from OT if patient refuses treatment 3 consecutive times without medical reason, if treatment goals not met, if there is a change in medical status, if patient makes no progress towards goals or if patient is discharged from hospital.  The above assessment, treatment plan, treatment alternatives and goals were discussed and mutually agreed upon: by patient and by family  [place] 05/16/2014, 12:10 PM

## 2014-05-16 NOTE — Evaluation (Signed)
Physical Therapy Assessment and Plan  Patient Details  Name: [name] MRN: [id number] Date of Birth: 1933/06/28  PT Diagnosis: Abnormal posture, Abnormality of gait, Cognitive deficits, Hemiparesis non-dominant, Impaired cognition, Impaired sensation and Muscle weakness Rehab Potential: Good ELOS: 10 days   Today's Date: 05/16/2014 Time: 0800-0902 Time Calculation (min): 62 min  Problem List:  Patient Active Problem List   Diagnosis Date Noted  . Angiodysplasia of intestine with hemorrhage 05/13/2014  . Acute ischemic stroke 05/11/2014  . Anemia 05/10/2014  . GI bleed 05/10/2014  . Melena 05/10/2014  . Ataxia, late effect of cerebrovascular disease 03/23/2014  . Atrial fibrillation 03/02/2014  . CVA (cerebral infarction) 02/10/2014  . Erosive gastritis with hemorrhage 02/08/2014  . Acute bilat watershed infarction vs. embolic strokes 88/82/8003  . Chronic diastolic CHF EF 49% 17/91/5056  . NSTEMI (non-ST elevated myocardial infarction) 02/04/2014  . Nonischemic cardiomyopathy 12/12/2013  . ARF (acute renal failure) 12/05/2013  . Memory difficulties 12/04/2013  . Pulmonary edema 12/03/2013  . Acute diastolic CHF (congestive heart failure) 12/03/2013  . Irregular heart rhythm 02/26/2012  . Ventricular bigeminy 02/26/2012  . CAD (coronary artery disease)   . CEREBRAL EMBOLISM, WITH INFARCTION 07/02/2010  . OSTEOPOROSIS 03/21/2009  . DEGENERATIVE JOINT DISEASE 07/24/2008  . INSOMNIA-SLEEP DISORDER-UNSPEC 07/24/2008  . Iron deficiency anemia secondary to blood loss (chronic) 01/06/2008  . OBSTRUCTIVE SLEEP APNEA 01/06/2008  . GASTROPARESIS, DIABETIC 08/03/2006  . HYPERLIPIDEMIA 08/03/2006  . PERIPHERAL NEUROPATHY 08/03/2006  . HYPERTENSION 08/03/2006  . Chronic diastolic congestive heart failure 08/03/2006  . REACTIVE AIRWAY DISEASE 08/03/2006  . GERD 08/03/2006  . DM (diabetes mellitus), type 2, uncontrolled 11/04/1983    Past Medical History:  Past Medical  History  Diagnosis Date  . Asthma   . CAD (coronary artery disease)     Pt reports MI in 2006 (no documentation).  Cardiolite in 05/2002 and 07/2006 did not reveal any reversible ischemia.  Pt follows with Dr. Rex Kras at [facility].  . CHF (congestive heart failure)     EF 25-30% with dilated LV, mild LVH, severe hypokinesis, and mod-severe reduction in RV function  . Osteoporosis   . HYPERTENSION 08/03/2006  . GASTROPARESIS, DIABETIC 08/03/2006  . HYPERLIPIDEMIA 08/03/2006  . OBSTRUCTIVE SLEEP APNEA 01/06/2008  . PERIPHERAL NEUROPATHY 08/03/2006  . GERD 08/03/2006  . LOW BACK PAIN, CHRONIC 08/03/2006  . OSTEOPOROSIS 03/21/2009  . CEREBRAL EMBOLISM, WITH INFARCTION 07/02/2010  . Angina   . Myocardial infarction "2 or 3"  . Pneumonia 02/26/12    "a few times; probably even today"  . Shortness of breath     "all the time"  . DIABETES MELLITUS, TYPE II 11/04/1983  . Blood transfusion 08/2011  . Lower GI bleeding 08/2011 and 01/2014  . Chronic daily headache   . Migraines   . Stroke summer 2011    "made my left hip worse"  . Uterine cancer   . Nonischemic cardiomyopathy 05/2010    Left heart catheterization:2011. Nonobstructive coronary artery disease.  . Pulmonary hypertension   . Hypertension   . NSTEMI (non-ST elevated myocardial infarction) 01/2014    type II with CVA  . Acute embolic stroke 9/79/4801  . Non-ST elevation MI (NSTEMI) 02/04/2014  . E. coli UTI 03/02/2014  . Renal failure, acute 02/04/2014  . Iron deficiency anemia secondary to blood loss (chronic) 01/06/2008    Qualifier: Diagnosis of  By: Tomasa Hosteller MD, Veronique D.   . Nonsustained ventricular tachycardia   . Angiodysplasia of intestine with hemorrhage 05/13/2014  Past Surgical History:  Past Surgical History  Procedure Laterality Date  . Esophagogastroduodenoscopy  08/26/2011    Procedure: ESOPHAGOGASTRODUODENOSCOPY (EGD);  Surgeon: [name], MD;  Location: [facility] ENDOSCOPY;  Service: Endoscopy;  Laterality: N/A;  .  Colonoscopy  08/28/2011    Procedure: COLONOSCOPY;  Surgeon: [name], MD;  Location: [place];  Service: Endoscopy;  Laterality: N/A;  . Vaginal hysterectomy    . Tubal ligation    . Cataract extraction w/ intraocular lens  implant, bilateral    . Toe surgery      "right big toe; operated on it to straighten it out; it was under"  . Polysomnogram  10/17/2005    AHI-7.28/hr. AHI REM-20.8/hr. Average oxygen saturation range during REM and NREM was 97%. Lowest oxygen saturation during REM sleep was 90%.  . Carotid duplex  05/28/2010    No significant extracranial carotid artery stenosis demonstrated. Vertebrals are patent w/ antegrade flow.  . Cardiac catheterization  05/24/2010    No intervention - recommed medical therapy.  . Cardiovascular stress test  08/07/2006    Moderate-severe defect seen in Basal inferior, Mid inferoseptal, Mid inferior, Mid inferolateral, and Apical inferior regions - consistent w/ infarct/scar. No scintigraphic evidence of inducible myocardial ischemia.  . Transthoracic echocardiogram  08/29/2011    EF 55-60%, moderate LVH,   . Esophagogastroduodenoscopy N/A 02/08/2014    Procedure: ESOPHAGOGASTRODUODENOSCOPY (EGD);  Surgeon: [name], MD;  Location: [facility] ENDOSCOPY;  Service: Endoscopy;  Laterality: N/A;  . Colonoscopy N/A 05/12/2014    Procedure: COLONOSCOPY;  Surgeon: [name], MD;  Location: [place];  Service: Endoscopy;  Laterality: N/A;  . Enteroscopy N/A 05/13/2014    Procedure: ENTEROSCOPY;  Surgeon: [name], MD;  Location: [place];  Service: Endoscopy;  Laterality: N/A;    Assessment & Plan Clinical Impression: [name] is a 80 y.o. right-handed female with history of CAD with recent NSTEMI, diabetes mellitus and peripheral neuropathy, diastolic congestive heart failure, chronic renal insufficiency with creatinine 2.98, GI bleed and early dementia. Patient well-known rehabilitation services from CVA in May of 2015 with  admit date status CIR 02/10/2014 to 02/20/2014. She was discharged to home requiring supervision for transfers and all mobility. She recently been placed on aspirin from both a CVA prophylaxis standpoint and cardiac history. Presented 05/10/2014 with diffuse weakness and shortness of breath. A CBC showed a hemoglobin of 4.7. Gastroenterology services Dr. Carlean Purl consulted. Colonoscopy completed 05/12/2014 showing a normal colon. Underwent enteroscopy 05/13/2014 with AV malformation found in the proximal jejunum. The remainder of the small bowel is otherwise normal. Patient has been transfused with latest hemoglobin 9.1. An MRI of the head was completed due to lethargy possible right-sided weakness showing 2 areas of acute infarct in the right parietal lobe. Await plan for possible aspirin therapy for CVA prophylaxis and resumed 05/15/2014.   Patient transferred to CIR on 05/15/2014 .   Patient currently requires max with mobility secondary to muscle weakness, decreased cardiorespiratoy endurance, impaired timing and sequencing, decreased coordination and decreased motor planning and decreased initiation, decreased awareness, decreased problem solving, decreased safety awareness, decreased memory and delayed processing.  Prior to hospitalization, patient was supervision with mobility and lived with Son in a House home.  Home access is 1+1Stairs to enter.  Patient will benefit from skilled PT intervention to maximize safe functional mobility, minimize fall risk and decrease caregiver burden for planned discharge home with 24 hour supervision.  Anticipate patient will benefit from follow up [facility]  at discharge.  PT - End of Session Activity Tolerance: Tolerates < 10 min activity, no significant change in vital signs Endurance Deficit: Yes Endurance Deficit Description: Pt needs rest break after 60mn standing PT Assessment Rehab Potential: Good PT Patient demonstrates impairments in the following area(s):  Balance;Endurance;Motor;Perception;Safety;Sensory PT Transfers Functional Problem(s): Bed Mobility;Bed to Chair;Car;Furniture PT Locomotion Functional Problem(s): Ambulation;Wheelchair Mobility;Stairs PT Plan PT Intensity: Minimum of 1-2 x/day ,45 to 90 minutes PT Frequency: 5 out of 7 days PT Duration Estimated Length of Stay: 10 days PT Treatment/Interventions: Ambulation/gait training;Balance/vestibular training;Community reintegration;Cognitive remediation/compensation;Discharge planning;Disease management/prevention;DME/adaptive equipment instruction;Functional mobility training;Neuromuscular re-education;Patient/family education;Psychosocial support;Stair training;Therapeutic Activities;UE/LE Strength taining/ROM;Therapeutic Exercise;UE/LE Coordination activities;Visual/perceptual remediation/compensation;Wheelchair propulsion/positioning PT Transfers Anticipated Outcome(s): Supervision Basic PT Locomotion Anticipated Outcome(s): Supervision with RW x 150 PT Recommendation Follow Up Recommendations: Home health PT;24 hour supervision/assistance Patient destination: Home Equipment Recommended: None recommended by PT Equipment Details: Pt has Transport chair and RW  Skilled Therapeutic Intervention Tx initiated with introduction to PT POC and family education on anticipated outcomes. Cognition and fatigue limit pt at this time. Son reports worsening memory and cognition since recent GI bleed, complicated by dementia. Pt engaged in therex for gen strengthening, activity tolerance, and coordination tasks including seated and standing marching and kinetron seated for reciprocal weight shifting and strengthening x550m. Pt needs cues for initiation and continuation of tasks as well as demo for instruction. [facility] and all needs in reach.   PT Evaluation Precautions/Restrictions Precautions Precautions: Fall Restrictions Weight Bearing Restrictions: No General    Vital SignsTherapy Vitals Pulse Rate: 75 BP: 176/75 mmHg Patient Position (if appropriate): Sitting Oxygen Therapy SpO2: 96 % O2 Device: None (Room air) Pain Pain Assessment Pain Assessment: No/denies pain Home Living/Prior Functioning Home Living Available Help at Discharge: Family;Personal care attendant;Available 24 hours/day Type of Home: House Home Access: Stairs to enter [employer] Steps: 1+1 Entrance Stairs-Rails: None Home Layout: One level Additional Comments: son reports pt to have caregiver that comes M-F 9-1 to assist with dressing/bathing.  Lives With: Son Prior Function Level of Independence: Requires assistive device for independence;Other (comment) (S assist 24/7 for safety)  Able to Take Stairs?: Yes Driving: No Vocation: Retired Comments: as per daughter at bedside, pt was transfering and ambulating with rolling walker and S, however has had several falls recently Vision/Perception  Vision - History Baseline Vision: Wears glasses only for reading Vision - Assessment Eye Alignment: Within Functional Limits Additional Comments: Difficult to assess due to cognition, difficulty following instruction Perception Perception: Within Functional Limits Praxis Praxis: Impaired Praxis Impairment Details: Initiation;Ideomotor Praxis-Other Comments: Pt unable to motor plan WC propulsion despite hand-over-hand cues. Needing cues for initiation of basic functional tasks  Cognition Overall Cognitive Status: Impaired/Different from baseline Arousal/Alertness: Awake/alert Orientation Level: Oriented to person;Disoriented to place;Disoriented to time;Disoriented to situation Attention: Selective Selective Attention: Impaired Awareness: Impaired Memory: Impaired Problem Solving: Impaired Problem Solving Impairment: Verbal complex Safety/Judgment: Impaired  Sensation Sensation Light Touch: Impaired by gross assessment Stereognosis: Appears  Intact Hot/Cold: Appears Intact Proprioception: Appears Intact Coordination Gross Motor Movements are Fluid and Coordinated: No Fine Motor Movements are Fluid and Coordinated: No Heel Shin Test: Decreased excursion Motor  Motor Motor: Hemiplegia;Motor impersistence Motor - Skilled Clinical Observations: Mild L-sided weakness  Mobility Bed Mobility Bed Mobility: Supine to Sit Supine to Sit: 5: Supervision Supine to Sit Details (indicate cue type and reason): Pt mvoes through long-sit to EOB with S cues for initiation and task completion Transfers Transfers: Yes Stand  Pivot Transfers: 2: Max Psychologist, occupational Details (indicate cue type and reason): Lifting and lowering assist needed as well as cues for initiation and technique.  Locomotion  Ambulation Ambulation: Yes Ambulation/Gait Assistance: 4: Min assist Ambulation Distance (Feet): 25 Feet Assistive device: Rolling walker Ambulation/Gait Assistance Details: Cues for posture and step length. Gait marked by decreased step length, and discontinuous steps over flexed trunk Stairs / Additional Locomotion Stairs: Yes Stairs Assistance: 3: Mod assist Stairs Assistance Details (indicate cue type and reason): Steadying assist and sequence cues. Pt unable to comlpete full set of 5, wanting to sit after descent Stair Management Technique: Two rails;Step to pattern;Forwards Number of Stairs: 3 Height of Stairs: 6 Wheelchair Mobility Wheelchair Mobility: Yes Wheelchair Assistance: 1: +1 Total assist - Pt given hand-over-hand cues for technique, but pt unable to effectively propell WC, repeatedly placing hands back in lap and shrugging shoulders.  Wheelchair Propulsion: Both upper extremities Wheelchair Parts Management: Needs assistance Distance: 2  Trunk/Postural Assessment  Cervical Assessment Cervical Assessment: Within Functional Limits Thoracic Assessment Thoracic Assessment: Exceptions to [facility] Thoracic AROM Overall  Thoracic AROM Comments: Forward flexed Lumbar Assessment Lumbar Assessment: Exceptions to [facility] Lumbar AROM Overall Lumbar AROM Comments: PPT and rigidity Postural Control Postural Control: Deficits on evaluation Righting Reactions: Delayed Protective Responses: Insufficient  Balance Balance Balance Assessed: Yes Dynamic Sitting Balance Dynamic Sitting - Balance Support: Right upper extremity supported;Left upper extremity supported;Feet unsupported Dynamic Sitting - Level of Assistance: 4: Min assist Dynamic Sitting Balance - Compensations: Heavy use of UEs for compensation Static Standing Balance Static Standing - Balance Support: Bilateral upper extremity supported;During functional activity Static Standing - Level of Assistance: 4: Min assist Static Standing - Comment/# of Minutes: 60mn at sink Dynamic Standing Balance Dynamic Standing - Balance Support: During functional activity;Bilateral upper extremity supported Dynamic Standing - Level of Assistance: 3: Mod assist Dynamic Standing - Balance Activities: Lateral lean/weight shifting;Forward lean/weight shifting;Reaching for objects;Reaching across midline Dynamic Standing - Comments: Pt shaky with dynamic standing tasks Extremity Assessment  RUE Assessment RUE Assessment: Exceptions to [facility]UE Strength RUE Overall Strength Comments: Gen weakness LUE Assessment LUE Assessment: Exceptions to [facility]CLUE Strength LUE Overall Strength Comments: Gen weakness RLE Assessment RLE Assessment: Exceptions to [facility]LE Strength RLE Overall Strength Comments: 3+/5 throughout LLE Assessment LLE Assessment: Exceptions to W[facility] LLCLLE Strength LLE Overall Strength Comments: 3/5 throughout  FIM:  FIM - Locomotion: Wheelchair Distance: 2 FIM - Locomotion: Ambulation Ambulation/Gait Assistance: 4: Min assist   Refer to Care Plan for Long Term Goals  Recommendations for other services: None  Discharge Criteria: Patient will be discharged  from PT if patient refuses treatment 3 consecutive times without medical reason, if treatment goals not met, if there is a change in medical status, if patient makes no progress towards goals or if patient is discharged from hospital.  The above assessment, treatment plan, treatment alternatives and goals were discussed and mutually agreed upon: by patient and by family CKennieth Hurley PT, DPT  05/16/2014, 10:03 AM

## 2014-05-16 NOTE — Progress Notes (Signed)
INITIAL NUTRITION ASSESSMENT  DOCUMENTATION CODES Per approved criteria  -Not Applicable   INTERVENTION: Glucerna Shake po TID, each supplement provides 220 kcal and 10 grams of protein  NUTRITION DIAGNOSIS: Inadequate oral intake related to SOB and pain as evidenced by reported intake less than estimated needs.   Goal: Pt to meet >/= 90% of their estimated nutrition needs   Monitor:  Weight trends, po intake, acceptance of supplements, labs  Reason for Assessment: Consult for poor po  80 y.o. female  Admitting Dx: <principal problem not specified>  ASSESSMENT: 80 y.o. right-handed female with history of CAD with recent NSTEMI, diabetes mellitus and peripheral neuropathy, diastolic congestive heart failure, chronic renal insufficiency with creatinine 2.98, GI bleed and early dementia.   - Pt reports that she has been eating well. Meal completion is varied from 25-100%. She reports no recent weight loss. Pt had questions regarding a healthy body weight. She was open to trying Glucerna Shakes. Pt was unsure of her usual body weight.  - Pt with no signs of significant fat and/or muscle wasting  Height: Ht Readings from Last 1 Encounters:  05/15/14 5' 2.5" (1.588 m)    Weight: Wt Readings from Last 1 Encounters:  05/15/14 159 lb 13.3 oz (72.5 kg)    Ideal Body Weight: 51.3 kg  % Ideal Body Weight: 141%  Wt Readings from Last 10 Encounters:  05/15/14 159 lb 13.3 oz (72.5 kg)  05/10/14 155 lb 13.8 oz (70.7 kg)  05/10/14 155 lb 13.8 oz (70.7 kg)  05/10/14 155 lb 13.8 oz (70.7 kg)  05/09/14 154 lb (69.854 kg)  04/25/14 154 lb (69.854 kg)  04/06/14 157 lb 1.6 oz (71.26 kg)  03/24/14 153 lb (69.4 kg)  03/23/14 160 lb (72.576 kg)  03/16/14 159 lb (72.122 kg)    BMI:  Body mass index is 28.75 kg/(m^2).  Estimated Nutritional Needs: Kcal: 1800-2000 Protein: 90-100 g Fluid: 1.8-2.0 L/day  Skin: Intact  Diet Order: Carb Control  EDUCATION NEEDS: -Education needs  addressed   Intake/Output Summary (Last 24 hours) at 05/16/14 1521 Last data filed at 05/16/14 1200  Gross per 24 hour  Intake    600 ml  Output      1 ml  Net    599 ml    Last BM: 8/2   Labs:   Recent Labs Lab 05/13/14 0249 05/14/14 0530 05/15/14 0254 05/16/14 0755  NA 136* 135* 134* 140  K 4.1 4.3 4.5 4.9  CL 109 106 104 108  CO2 18* 18* 17* 18*  BUN 34* 27* 22 20  CREATININE 1.80* 1.75* 1.48* 1.53*  CALCIUM 8.1* 8.0* 8.1* 8.5  MG 1.7 1.8 1.8  --   GLUCOSE 96 100* 169* 76    CBG (last 3)   Recent Labs  05/15/14 2051 05/16/14 0725 05/16/14 1116  GLUCAP 229* 72 155*    Scheduled Meds: . aspirin  81 mg Oral Daily  . atorvastatin  40 mg Oral q1800  . insulin aspart  0-15 Units Subcutaneous TID WC & HS  . insulin glargine  8 Units Subcutaneous QHS  . isosorbide mononitrate  60 mg Oral Daily  . lisinopril  5 mg Oral Daily  . magnesium oxide  400 mg Oral BID  . metoprolol succinate  25 mg Oral Daily  . pantoprazole  40 mg Oral QAC breakfast    Continuous Infusions:   Past Medical History  Diagnosis Date  . Asthma   . CAD (coronary artery disease)  Pt reports MI in 2006 (no documentation).  Cardiolite in 05/2002 and 07/2006 did not reveal any reversible ischemia.  Pt [facility] with [facility] Rex [facility]ras at [facility].  . CHF (congestive heart failure)     EF 25-30% with dilated LV, mild LVH, severe hypokinesis, and mod-severe reduction in RV function  . Osteoporosis   . HYPERTENSION 08/03/2006  . GASTROPARESIS, DIABETIC 08/03/2006  . HYPERLIPIDEMIA 08/03/2006  . OBSTRUCTIVE SLEEP APNEA 01/06/2008  . PERIPHERAL NEUROPATHY 08/03/2006  . GERD 08/03/2006  . LOW BACK PAIN, CHRONIC 08/03/2006  . OSTEOPOROSIS 03/21/2009  . CEREBRAL EMBOLISM, WITH INFARCTION 07/02/2010  . Angina   . Myocardial infarction "2 or 3"  . Pneumonia 02/26/12    "a few times; probably even today"  . Shortness of breath     "all the time"  . DIABETES MELLITUS, TYPE II 11/04/1983  . Blood  transfusion 08/2011  . Lower GI bleeding 08/2011 and 01/2014  . Chronic daily headache   . Migraines   . Stroke summer 2011    "made my left hip worse"  . Uterine cancer   . Nonischemic cardiomyopathy 05/2010    Left heart catheterization:2011. Nonobstructive coronary artery disease.  . Pulmonary hypertension   . Hypertension   . NSTEMI (non-ST elevated myocardial infarction) 01/2014    type II with CVA  . Acute embolic stroke 3/64/6803  . Non-ST elevation MI (NSTEMI) 02/04/2014  . E. coli UTI 03/02/2014  . Renal failure, acute 02/04/2014  . Iron deficiency anemia secondary to blood loss (chronic) 01/06/2008    Qualifier: Diagnosis of  By: Tomasa Hosteller MD, Veronique D.   . Nonsustained ventricular tachycardia   . Angiodysplasia of intestine with hemorrhage 05/13/2014    Past Surgical History  Procedure Laterality Date  . Esophagogastroduodenoscopy  08/26/2011    Procedure: ESOPHAGOGASTRODUODENOSCOPY (EGD);  Surgeon: [name], MD;  Location: [facility] ENDOSCOPY;  Service: Endoscopy;  Laterality: N/A;  . Colonoscopy  08/28/2011    Procedure: COLONOSCOPY;  Surgeon: [name], MD;  Location: [place];  Service: Endoscopy;  Laterality: N/A;  . Vaginal hysterectomy    . Tubal ligation    . Cataract extraction w/ intraocular lens  implant, bilateral    . Toe surgery      "right big toe; operated on it to straighten it out; it was under"  . Polysomnogram  10/17/2005    AHI-7.28/hr. AHI REM-20.8/hr. Average oxygen saturation range during REM and NREM was 97%. Lowest oxygen saturation during REM sleep was 90%.  . Carotid duplex  05/28/2010    No significant extracranial carotid artery stenosis demonstrated. Vertebrals are patent w/ antegrade flow.  . Cardiac catheterization  05/24/2010    No intervention - recommed medical therapy.  . Cardiovascular stress test  08/07/2006    Moderate-severe defect seen in Basal inferior, Mid inferoseptal, Mid inferior, Mid inferolateral, and Apical inferior  regions - consistent w/ infarct/scar. No scintigraphic evidence of inducible myocardial ischemia.  . Transthoracic echocardiogram  08/29/2011    EF 55-60%, moderate LVH,   . Esophagogastroduodenoscopy N/A 02/08/2014    Procedure: ESOPHAGOGASTRODUODENOSCOPY (EGD);  Surgeon: [name], MD;  [facility] ENDOSCOPY;  Service: Endoscopy;  Laterality: N/A;  . Colonoscopy N/A 05/12/2014    Procedure: COLONOSCOPY;  Surgeon: [name], MD;  Location: [place];  Service: Endoscopy;  Laterality: N/A;  . Enteroscopy N/A 05/13/2014    Procedure: ENTEROSCOPY;  Surgeon: [name], MD;  Location: [place];  Service: Endoscopy;  Laterality: N/A;    Terrace Arabia  RD, LDN

## 2014-05-16 NOTE — Progress Notes (Signed)
Social Work Assessment and Plan Social Work Assessment and Plan  Patient Details  Name: [name] MRN: [id number] Date of Birth: 08-30-1933  Today's Date: 05/16/2014  Problem List:  Patient Active Problem List   Diagnosis Date Noted  . Angiodysplasia of intestine with hemorrhage 05/13/2014  . Acute ischemic stroke 05/11/2014  . Anemia 05/10/2014  . GI bleed 05/10/2014  . Melena 05/10/2014  . Ataxia, late effect of cerebrovascular disease 03/23/2014  . Atrial fibrillation 03/02/2014  . CVA (cerebral infarction) 02/10/2014  . Erosive gastritis with hemorrhage 02/08/2014  . Acute bilat watershed infarction vs. embolic strokes 75/17/0017  . Chronic diastolic CHF EF 49% 44/96/7591  . NSTEMI (non-ST elevated myocardial infarction) 02/04/2014  . Nonischemic cardiomyopathy 12/12/2013  . ARF (acute renal failure) 12/05/2013  . Memory difficulties 12/04/2013  . Pulmonary edema 12/03/2013  . Acute diastolic CHF (congestive heart failure) 12/03/2013  . Irregular heart rhythm 02/26/2012  . Ventricular bigeminy 02/26/2012  . CAD (coronary artery disease)   . CEREBRAL EMBOLISM, WITH INFARCTION 07/02/2010  . OSTEOPOROSIS 03/21/2009  . DEGENERATIVE JOINT DISEASE 07/24/2008  . INSOMNIA-SLEEP DISORDER-UNSPEC 07/24/2008  . Iron deficiency anemia secondary to blood loss (chronic) 01/06/2008  . OBSTRUCTIVE SLEEP APNEA 01/06/2008  . GASTROPARESIS, DIABETIC 08/03/2006  . HYPERLIPIDEMIA 08/03/2006  . PERIPHERAL NEUROPATHY 08/03/2006  . HYPERTENSION 08/03/2006  . Chronic diastolic congestive heart failure 08/03/2006  . REACTIVE AIRWAY DISEASE 08/03/2006  . GERD 08/03/2006  . DM (diabetes mellitus), type 2, uncontrolled 11/04/1983   Past Medical History:  Past Medical History  Diagnosis Date  . Asthma   . CAD (coronary artery disease)     Pt reports MI in 2006 (no documentation).  Cardiolite in 05/2002 and 07/2006 did not reveal any reversible ischemia.  Pt follows with Dr. Rex Kras at [facility].   . CHF (congestive heart failure)     EF 25-30% with dilated LV, mild LVH, severe hypokinesis, and mod-severe reduction in RV function  . Osteoporosis   . HYPERTENSION 08/03/2006  . GASTROPARESIS, DIABETIC 08/03/2006  . HYPERLIPIDEMIA 08/03/2006  . OBSTRUCTIVE SLEEP APNEA 01/06/2008  . PERIPHERAL NEUROPATHY 08/03/2006  . GERD 08/03/2006  . LOW BACK PAIN, CHRONIC 08/03/2006  . OSTEOPOROSIS 03/21/2009  . CEREBRAL EMBOLISM, WITH INFARCTION 07/02/2010  . Angina   . Myocardial infarction "2 or 3"  . Pneumonia 02/26/12    "a few times; probably even today"  . Shortness of breath     "all the time"  . DIABETES MELLITUS, TYPE II 11/04/1983  . Blood transfusion 08/2011  . Lower GI bleeding 08/2011 and 01/2014  . Chronic daily headache   . Migraines   . Stroke summer 2011    "made my left hip worse"  . Uterine cancer   . Nonischemic cardiomyopathy 05/2010    Left heart catheterization:2011. Nonobstructive coronary artery disease.  . Pulmonary hypertension   . Hypertension   . NSTEMI (non-ST elevated myocardial infarction) 01/2014    type II with CVA  . Acute embolic stroke 6/38/4665  . Non-ST elevation MI (NSTEMI) 02/04/2014  . E. coli UTI 03/02/2014  . Renal failure, acute 02/04/2014  . Iron deficiency anemia secondary to blood loss (chronic) 01/06/2008    Qualifier: Diagnosis of  By: Tomasa Hosteller MD, Veronique D.   . Nonsustained ventricular tachycardia   . Angiodysplasia of intestine with hemorrhage 05/13/2014   Past Surgical History:  Past Surgical History  Procedure Laterality Date  . Esophagogastroduodenoscopy  08/26/2011    Procedure: ESOPHAGOGASTRODUODENOSCOPY (EGD);  Surgeon: [name], MD;  Location: [facility];  Service: Endoscopy;  Laterality: N/A;  . Colonoscopy  08/28/2011    Procedure: COLONOSCOPY;  Surgeon: [name], MD;  Location: [place];  Service: Endoscopy;  Laterality: N/A;  . Vaginal hysterectomy    . Tubal ligation    . Cataract extraction w/ intraocular  lens  implant, bilateral    . Toe surgery      "right big toe; operated on it to straighten it out; it was under"  . Polysomnogram  10/17/2005    AHI-7.28/hr. AHI REM-20.8/hr. Average oxygen saturation range during REM and NREM was 97%. Lowest oxygen saturation during REM sleep was 90%.  . Carotid duplex  05/28/2010    No significant extracranial carotid artery stenosis demonstrated. Vertebrals are patent w/ antegrade flow.  . Cardiac catheterization  05/24/2010    No intervention - recommed medical therapy.  . Cardiovascular stress test  08/07/2006    Moderate-severe defect seen in Basal inferior, Mid inferoseptal, Mid inferior, Mid inferolateral, and Apical inferior regions - consistent w/ infarct/scar. No scintigraphic evidence of inducible myocardial ischemia.  . Transthoracic echocardiogram  08/29/2011    EF 55-60%, moderate LVH,   . Esophagogastroduodenoscopy N/A 02/08/2014    Procedure: ESOPHAGOGASTRODUODENOSCOPY (EGD);  Surgeon: [name], MD;  Location: [facility] ENDOSCOPY;  Service: Endoscopy;  Laterality: N/A;  . Colonoscopy N/A 05/12/2014    Procedure: COLONOSCOPY;  Surgeon: [name], MD;  Location: [place];  Service: Endoscopy;  Laterality: N/A;  . Enteroscopy N/A 05/13/2014    Procedure: ENTEROSCOPY;  Surgeon: [name], MD;  Location: [place];  Service: Endoscopy;  Laterality: N/A;   Social History:  reports that she has never smoked. She quit smokeless tobacco use about 7 years ago. Her smokeless tobacco use included Snuff. She reports that she does not drink alcohol or use illicit drugs.  Family / Support Systems Marital Status: Widow/Widower Patient Roles: Parent Children: [name]  [phone] Other Supports: Luan Pulling  [phone]    Baker Janus Williams-[phone]-cell Anticipated Caregiver: [name] and Physiological scientist Ability/Limitations of Caregiver: Son has been providing care prior to admission here since last May after her  stroke Caregiver Availability: 24/7 Family Dynamics: Close knit family pt has five daughter's and two son's all of which are very supportive and involved. Son reports they were doing well and he is glad now the MD has found where the bleeding was coming from.  Pt just looks and smiles not saying mcuh but allowing son to speak for her.  Social History Preferred language: English Religion: Christian Cultural Background: No issues Education: High School Read: Yes Write: Yes Employment Status: Retired Freight forwarder Issues: No issues Guardian/Conservator: None-according to MD pt is not capable of making her own decisions will look toward her children since no formal POA has ever been done.   Abuse/Neglect Physical Abuse: Denies Verbal Abuse: Denies Sexual Abuse: Denies Exploitation of patient/patient's resources: Denies Self-Neglect: Denies  Emotional Status Pt's affect, behavior adn adjustment status: Pt has always been one to try to do for herself, she does not want to rely upon others.  Son reports she has always done what she could for herself and then family members help her with what she can't.  Pt smiles and reports she will do her  best while here. Recent Psychosocial Issues: Other medical issues was recenlty here after a stroke in May Pyschiatric History: No history deferred depression screen due to her dementia and seems to be doing well.  Will intervene if necessary, will  get input from team also. Substance Abuse History: No issues  Patient / Family Perceptions, Expectations & Goals Pt/Family understanding of illness & functional limitations: Pt and son can explain her reason for being hospitalized.  Son is hopeful once she regains her strength she will get back to previous level of functioning.  He plans to be here often to see her in therapies and watch her progress, just like he did last time.  Pt allows son to speak for her. Premorbid pt/family roles/activities:  Mother, Grandmother, Retiree, Church member, etc Anticipated changes in roles/activities/participation: resume Pt/family expectations/goals: Pt states: " I want to get stronger before I go home."  Son states: " I hope she can get back to where she was before this, she was doing well."  [employer]: Other (Comment) (PCS- M-F 9;00-1;00 pm) Premorbid Home Care/DME Agencies: Other (Comment) [facility] last May) Transportation available at discharge: Family Resource referrals recommended: Support group (specify) ([facility]A Support Group)  Discharge Planning Living Arrangements: Children Support Systems: Children;Other relatives;Friends/neighbors;Church/faith community;Home care staff Type of Residence: Private residence Insurance Resources: Medicare;Medicaid (specify county) Sports coach Co) Museum/gallery curator Resources: Radio broadcast assistant Screen Referred: No Living Expenses: Lives with family Money Management: Family Does the patient have any problems obtaining your medications?: No Home Management: Family does home management Patient/Family Preliminary Plans: Return home wiht son and PCS caregiver.  Son can provide assist if needed, like he did on last admit.  When pt was discharged from rehab in May she required 24 hr supervision level, which the family provided-between son and pt's other children. Social Work Anticipated Follow Up Needs: HH/OP;Support Group  Clinical Impression Pleasant female who is well known to rehab from admit in May, did well then.  Supportive son and other children who are here frequently and are involved. Aware of the rehab process and team conference tomorrow.  Will meet with to discuss goals and target discharge date.  Pt's main issue is weakness, from surgery and blood loss. Will work on a safe discharge plan.  Elease Hashimoto 05/16/2014, 1:51 PM

## 2014-05-16 NOTE — Care Management Note (Signed)
Inpatient [facility] Individual Statement of Services  Patient Name:  [name]  Date:  05/16/2014  Welcome to the [facility].  Our goal is to provide you with an individualized program based on your diagnosis and situation, designed to meet your specific needs.  With this comprehensive rehabilitation program, you will be expected to participate in at least 3 hours of rehabilitation therapies Monday-Friday, with modified therapy programming on the weekends.  Your rehabilitation program will include the following services:  Physical Therapy (PT), Occupational Therapy (OT), Speech Therapy (ST), 24 hour per day rehabilitation nursing, Case Management (Social Worker), Rehabilitation Medicine, Nutrition Services and Pharmacy Services  Weekly team conferences will be held on Wednesday to discuss your progress.  Your Social Worker will talk with you frequently to get your input and to update you on team discussions.  Team conferences with you and your family in attendance may also be held.  Expected length of stay: 8-10 days Overall anticipated outcome: supervision with cues  Depending on your progress and recovery, your program may change. Your Social Worker will coordinate services and will keep you informed of any changes. Your Social Worker's name and contact numbers are listed  below.  The following services may also be recommended but are not provided by the [facility]:    [place] will be made to provide these services after discharge if needed.  Arrangements include referral to agencies that provide these services.  Your insurance has been verified to be:  Medicare & Medicaid Your primary doctor is:  Iona Beard  Pertinent information will be shared with your doctor and your insurance company.  Social Worker:  Ovidio Kin, [place] or ([phone]  Information discussed with and copy given to patient by: Elease Hashimoto, 05/16/2014, 9:02 AM

## 2014-05-16 NOTE — Progress Notes (Signed)
[facility] PHYSICAL MEDICINE & REHABILITATION     PROGRESS NOTE    Subjective/Complaints: Fairly uneventful night. Slow to arouse this am. ROS limited due to language/cognition  Objective: Vital Signs: Blood pressure 176/75, pulse 75, temperature 98 F (36.7 C), temperature source Oral, resp. rate 18, height 5' 2.5" (1.588 m), weight 72.5 kg (159 lb 13.3 oz), last menstrual period 12/19/1968, SpO2 96.00%. No results found.  Recent Labs  05/14/14 0530 05/15/14 0254  WBC 9.7 12.1*  HGB 9.0* 9.1*  HCT 28.1* 28.1*  PLT 307 339    Recent Labs  05/15/14 0254 05/16/14 0755  NA 134* 140  K 4.5 4.9  CL 104 108  GLUCOSE 169* 76  BUN 22 20  CREATININE 1.48* 1.53*  CALCIUM 8.1* 8.5   CBG (last 3)   Recent Labs  05/15/14 1656 05/15/14 2051 05/16/14 0725  GLUCAP 204* 229* 72    Wt Readings from Last 3 Encounters:  05/15/14 72.5 kg (159 lb 13.3 oz)  05/10/14 70.7 kg (155 lb 13.8 oz)  05/10/14 70.7 kg (155 lb 13.8 oz)    Physical Exam:  Gen: a little slow to arouse HENT: oral mucosa moist  Head: Normocephalic.  Eyes: EOM are normal.  Neck: Normal range of motion. Neck supple. No thyromegaly present.  Cardiovascular:  Cardiac rate controlled  Respiratory: Effort normal and breath sounds normal. No respiratory distress.  GI: Soft. Bowel sounds are normal. She exhibits no distension.  Neurological: She is alert.  Expressive language deficits. She will answer some simple yes no questions but inaccurate. Follows some simple demonstrated commands . Limitations in left sided motor vs right but Manual muscle testing difficult to assess secondary to aphasia and inability to follow commands. Senses pain in all 4's.   Skin: Skin is warm and dry  Psych: flat,  cooperative   Assessment/Plan: 1. Functional deficits secondary to right parietal lobe infarct which require 3+ hours per day of interdisciplinary therapy in a comprehensive inpatient rehab setting. Physiatrist is  providing close team supervision and 24 hour management of active medical problems listed below. Physiatrist and rehab team continue to assess barriers to discharge/monitor patient progress toward functional and medical goals. FIM:                   Comprehension Comprehension Mode: Auditory Comprehension: 4-Understands basic 75 - 89% of the time/requires cueing 10 - 24% of the time  Expression Expression Mode: Verbal Expression: 4-Expresses basic 75 - 89% of the time/requires cueing 10 - 24% of the time. Needs helper to occlude trach/needs to repeat words.  Social Interaction Social Interaction: 4-Interacts appropriately 75 - 89% of the time - Needs redirection for appropriate language or to initiate interaction.  Problem Solving Problem Solving: 4-Solves basic 75 - 89% of the time/requires cueing 10 - 24% of the time  Memory Memory: 3-Recognizes or recalls 50 - 74% of the time/requires cueing 25 - 49% of the time  Medical Problem List and Plan:  1. Functional deficits secondary to right parietal lobe infarct as well as history of CVA May of 2015  2. DVT Prophylaxis/Anticoagulation: SCDs. Monitor for any signs of DVT  3. Pain Management: Tylenol as needed  4. Mood/dementia: Patient appears at baseline  5. Neuropsych: This patient is not capable of making decisions on her own behalf.  6. Skin/Wound Care: Routine skin checks  7. GI bleed/AV malformation. Followup gastroscopy services. Monitor CBC. Continue Protonix daily  8. Diabetes mellitus with peripheral neuropathy. Hemoglobin A1c 7.0.  Lantus insulin 8 units daily.   -follow for pattern 9. Hypertension. Toprol-XL 25 mg daily, lisinopril 5 mg daily, Imdur 60 mg daily.   -may need further titration if elevation is persistent 10. Hyperlipidemia. Lipitor  11. Chronic renal insufficiency. Baseline creatinine 2.98. Followup chemistries  12. Diastolic congestive heart failure. Monitor for any signs of fluid overload  13.  Low grade temp  -IS  -check UA/CX  LOS (Days) 1 A FACE TO FACE EVALUATION WAS PERFORMED  SWARTZ,ZACHARY T 05/16/2014 8:43 AM

## 2014-05-16 NOTE — Evaluation (Signed)
Speech Language Pathology Assessment and Plan  Patient Details  Name: [name] MRN: [id number] Date of Birth: [birth year]  SLP Diagnosis: Cognitive Impairments;Speech and Language deficits  Rehab Potential: Good ELOS: 10-12 days    Today's Date: 05/16/2014 Time: 6010-9323 Time Calculation (min): 60 min  Problem List:  Patient Active Problem List   Diagnosis Date Noted  . Angiodysplasia of intestine with hemorrhage 05/13/2014  . Acute ischemic stroke 05/11/2014  . Anemia 05/10/2014  . GI bleed 05/10/2014  . Melena 05/10/2014  . Ataxia, late effect of cerebrovascular disease 03/23/2014  . Atrial fibrillation 03/02/2014  . CVA (cerebral infarction) 02/10/2014  . Erosive gastritis with hemorrhage 02/08/2014  . Acute bilat watershed infarction vs. embolic strokes 55/73/2202  . Chronic diastolic CHF EF 54% 27/03/2375  . NSTEMI (non-ST elevated myocardial infarction) 02/04/2014  . Nonischemic cardiomyopathy 12/12/2013  . ARF (acute renal failure) 12/05/2013  . Memory difficulties 12/04/2013  . Pulmonary edema 12/03/2013  . Acute diastolic CHF (congestive heart failure) 12/03/2013  . Irregular heart rhythm 02/26/2012  . Ventricular bigeminy 02/26/2012  . CAD (coronary artery disease)   . CEREBRAL EMBOLISM, WITH INFARCTION 07/02/2010  . OSTEOPOROSIS 03/21/2009  . DEGENERATIVE JOINT DISEASE 07/24/2008  . INSOMNIA-SLEEP DISORDER-UNSPEC 07/24/2008  . Iron deficiency anemia secondary to blood loss (chronic) 01/06/2008  . OBSTRUCTIVE SLEEP APNEA 01/06/2008  . GASTROPARESIS, DIABETIC 08/03/2006  . HYPERLIPIDEMIA 08/03/2006  . PERIPHERAL NEUROPATHY 08/03/2006  . HYPERTENSION 08/03/2006  . Chronic diastolic congestive heart failure 08/03/2006  . REACTIVE AIRWAY DISEASE 08/03/2006  . GERD 08/03/2006  . DM (diabetes mellitus), type 2, uncontrolled 11/04/1983   Past Medical History:  Past Medical History  Diagnosis Date  . Asthma   . CAD (coronary artery disease)     Pt reports  MI in 2006 (no documentation).  Cardiolite in 05/2002 and 07/2006 did not reveal any reversible ischemia.  Pt follows with Dr. Rex Kras at [facility].  . CHF (congestive heart failure)     EF 25-30% with dilated LV, mild LVH, severe hypokinesis, and mod-severe reduction in RV function  . Osteoporosis   . HYPERTENSION 08/03/2006  . GASTROPARESIS, DIABETIC 08/03/2006  . HYPERLIPIDEMIA 08/03/2006  . OBSTRUCTIVE SLEEP APNEA 01/06/2008  . PERIPHERAL NEUROPATHY 08/03/2006  . GERD 08/03/2006  . LOW BACK PAIN, CHRONIC 08/03/2006  . OSTEOPOROSIS 03/21/2009  . CEREBRAL EMBOLISM, WITH INFARCTION 07/02/2010  . Angina   . Myocardial infarction "2 or 3"  . Pneumonia 02/26/12    "a few times; probably even today"  . Shortness of breath     "all the time"  . DIABETES MELLITUS, TYPE II 11/04/1983  . Blood transfusion 08/2011  . Lower GI bleeding 08/2011 and 01/2014  . Chronic daily headache   . Migraines   . Stroke summer 2011    "made my left hip worse"  . Uterine cancer   . Nonischemic cardiomyopathy 05/2010    Left heart catheterization:2011. Nonobstructive coronary artery disease.  . Pulmonary hypertension   . Hypertension   . NSTEMI (non-ST elevated myocardial infarction) 01/2014    type II with CVA  . Acute embolic stroke 2/83/1517  . Non-ST elevation MI (NSTEMI) 02/04/2014  . E. coli UTI 03/02/2014  . Renal failure, acute 02/04/2014  . Iron deficiency anemia secondary to blood loss (chronic) 01/06/2008    Qualifier: Diagnosis of  By: Tomasa Hosteller MD, Veronique D.   . Nonsustained ventricular tachycardia   . Angiodysplasia of intestine with hemorrhage 05/13/2014   Past Surgical History:  Past Surgical History  Procedure  Laterality Date  . Esophagogastroduodenoscopy  08/26/2011    Procedure: ESOPHAGOGASTRODUODENOSCOPY (EGD);  Surgeon: Iva Boop, MD;  Location: [facility] ENDOSCOPY;  Service: Endoscopy;  Laterality: N/A;  . Colonoscopy  08/28/2011    Procedure: COLONOSCOPY;  Surgeon: Iva Boop, MD;   Location: [facility] ENDOSCOPY;  Service: Endoscopy;  Laterality: N/A;  . Vaginal hysterectomy    . Tubal ligation    . Cataract extraction w/ intraocular lens  implant, bilateral    . Toe surgery      "right big toe; operated on it to straighten it out; it was under"  . Polysomnogram  10/17/2005    AHI-7.28/hr. AHI REM-20.8/hr. Average oxygen saturation range during REM and NREM was 97%. Lowest oxygen saturation during REM sleep was 90%.  . Carotid duplex  05/28/2010    No significant extracranial carotid artery stenosis demonstrated. Vertebrals are patent w/ antegrade flow.  . Cardiac catheterization  05/24/2010    No intervention - recommed medical therapy.  . Cardiovascular stress test  08/07/2006    Moderate-severe defect seen in Basal inferior, Mid inferoseptal, Mid inferior, Mid inferolateral, and Apical inferior regions - consistent w/ infarct/scar. No scintigraphic evidence of inducible myocardial ischemia.  . Transthoracic echocardiogram  08/29/2011    EF 55-60%, moderate LVH,   . Esophagogastroduodenoscopy N/A 02/08/2014    Procedure: ESOPHAGOGASTRODUODENOSCOPY (EGD);  Surgeon: Iva Boop, MD;  Location: [facility] ENDOSCOPY;  Service: Endoscopy;  Laterality: N/A;  . Colonoscopy N/A 05/12/2014    Procedure: COLONOSCOPY;  Surgeon: Iva Boop, MD;  Location: [facility] ENDOSCOPY;  Service: Endoscopy;  Laterality: N/A;  . Enteroscopy N/A 05/13/2014    Procedure: ENTEROSCOPY;  Surgeon: Iva Boop, MD;  Location: [facility] ENDOSCOPY;  Service: Endoscopy;  Laterality: N/A;    Assessment / Plan / Recommendation Clinical Impression  [name] is a 80 y.o. right-handed female with history of CAD with recent NSTEMI, diabetes mellitus and peripheral neuropathy, diastolic congestive heart failure, chronic renal insufficiency with creatinine 2.98, GI bleed and early dementia. Patient well-known rehabilitation services from CVA in May of 2015 with admit date status CIR 02/10/2014 to 02/20/2014. She was discharged to  home requiring supervision for transfers and all mobility. She recently been placed on aspirin from both a CVA prophylaxis standpoint and cardiac history. Presented 05/10/2014 with diffuse weakness and shortness of breath. A CBC showed a hemoglobin of 4.7. Gastroenterology services Dr. Leone Payor consulted. Colonoscopy completed 05/12/2014 showing a normal colon. Underwent enteroscopy 05/13/2014 with AV malformation found in the proximal jejunum. The remainder of the small bowel is otherwise normal. Patient has been transfused with latest hemoglobin 9.1. An MRI of the head was completed due to lethargy possible right-sided weakness showing 2 areas of acute infarct in the right parietal lobe. Await plan for possible aspirin therapy for CVA prophylaxis and resumed 05/15/2014. Physical therapy evaluation completed 05/14/2014 with recommendations of physical medicine rehabilitation consult. Patient was admitted for comprehensive rehabilitation program 05/15/2014 with SLP evaluation completed 05/16/2014 with the following results.   Pt presents with overall moderate cognitive-linguistic impairments characterized by decreased sustained attention which impacts her ability to effectively problem solve during basic, familiar tasks and recall daily events and information.  Furthermore, pt's decreased sustained attention and delayed processing resulted in pt presenting with decreased functional communication for initiation, perseveration, and word finding.  Pt also exhibited difficulty following multi-step commands in context secondary to cognitive impairments.  Pt was oriented to person only and benefited from max cuing including choice of three and use of  calendars and other visual aids to reorient.  Additionally, pt presents with grossly intact swallowing function with no overt s/s of aspiration noted across any consistencies assessed including thin liquids, purees, solids, and mixed solid and liquid consistencies.  Recommend  that pt continue on a regular diet with thin liquids.  Pt would benefit from skilled speech follow up while inpatient to target cognitive-linguistic function in order to maximize functional independence and reduce burden of care upon discharge.     Skilled Therapeutic Interventions          Cognitive-linguistic evaluation completed with results and recommendations reviewed with family.     SLP Assessment  Patient will need skilled Speech Lanaguage Pathology Services during CIR admission    Recommendations  Diet Recommendations: Regular;Thin liquid Liquid Administration via: Cup;Straw Medication Administration: Whole meds with liquid Supervision: Patient able to self feed Postural Changes and/or Swallow Maneuvers: Seated upright 90 degrees Oral Care Recommendations: Oral care BID Patient destination: Home Follow up Recommendations: 24 hour supervision/assistance;Home Health SLP Equipment Recommended: None recommended by SLP    SLP Frequency 5 out of 7 days   SLP Treatment/Interventions Cognitive remediation/compensation;Cueing hierarchy;Functional tasks;Environmental controls;Internal/external aids;Patient/family education    Pain Pain Assessment Pain Assessment: No/denies pain Prior Functioning Cognitive/Linguistic Baseline: Baseline deficits Baseline deficit details: previous history of stroke, required 24/7 assist from family  Type of Home: House  Lives With: Son Available Help at Discharge: Family;Personal care attendant;Available 24 hours/day Vocation: Retired  Industrial/product designer Term Goals: Week 1: SLP Short Term Goal 1 (Week 1): Pt will sustain attention to functional tasks for 5-7 mintues with min cuing for redirection.  SLP Short Term Goal 2 (Week 1): Pt will improve basic problem solving during functional tasks for 80% accuracy with mod assist  SLP Short Term Goal 3 (Week 1): Pt will use external aids to facilitate improved recall of daily events and information for 80% accuracy with  mod assist.   See FIM for current functional status Refer to Care Plan for Long Term Goals  Recommendations for other services: None  Discharge Criteria: Patient will be discharged from SLP if patient refuses treatment 3 consecutive times without medical reason, if treatment goals not met, if there is a change in medical status, if patient makes no progress towards goals or if patient is discharged from hospital.  The above assessment, treatment plan, treatment alternatives and goals were discussed and mutually agreed upon: by patient and by family  Windell Moulding, M.A. CCC-SLP  [name], Selinda Orion 05/16/2014, 5:17 PM

## 2014-05-17 ENCOUNTER — Inpatient Hospital Stay (HOSPITAL_COMMUNITY): Payer: Medicare Other | Admitting: Occupational Therapy

## 2014-05-17 ENCOUNTER — Inpatient Hospital Stay (HOSPITAL_COMMUNITY): Payer: Medicare Other | Admitting: Physical Therapy

## 2014-05-17 ENCOUNTER — Inpatient Hospital Stay (HOSPITAL_COMMUNITY): Payer: Self-pay | Admitting: Speech Pathology

## 2014-05-17 DIAGNOSIS — I5032 Chronic diastolic (congestive) heart failure: Secondary | ICD-10-CM

## 2014-05-17 DIAGNOSIS — Z5189 Encounter for other specified aftercare: Secondary | ICD-10-CM

## 2014-05-17 DIAGNOSIS — IMO0001 Reserved for inherently not codable concepts without codable children: Secondary | ICD-10-CM

## 2014-05-17 DIAGNOSIS — I6330 Cerebral infarction due to thrombosis of unspecified cerebral artery: Secondary | ICD-10-CM

## 2014-05-17 DIAGNOSIS — I10 Essential (primary) hypertension: Secondary | ICD-10-CM

## 2014-05-17 DIAGNOSIS — E1165 Type 2 diabetes mellitus with hyperglycemia: Secondary | ICD-10-CM

## 2014-05-17 LAB — URINALYSIS, ROUTINE W REFLEX MICROSCOPIC
Bilirubin Urine: NEGATIVE
GLUCOSE, UA: NEGATIVE mg/dL
Ketones, ur: NEGATIVE mg/dL
Nitrite: NEGATIVE
Protein, ur: 100 mg/dL — AB
Specific Gravity, Urine: 1.015 (ref 1.005–1.030)
Urobilinogen, UA: 0.2 mg/dL (ref 0.0–1.0)
pH: 5 (ref 5.0–8.0)

## 2014-05-17 LAB — GLUCOSE, CAPILLARY
GLUCOSE-CAPILLARY: 159 mg/dL — AB (ref 70–99)
GLUCOSE-CAPILLARY: 141 mg/dL — AB (ref 70–99)
GLUCOSE-CAPILLARY: 173 mg/dL — AB (ref 70–99)
Glucose-Capillary: 205 mg/dL — ABNORMAL HIGH (ref 70–99)

## 2014-05-17 LAB — URINE MICROSCOPIC-ADD ON

## 2014-05-17 MED ORDER — INSULIN GLARGINE 100 UNIT/ML ~~LOC~~ SOLN
5.0000 [IU] | Freq: Every day | SUBCUTANEOUS | Status: DC
  Administered 2014-05-17 – 2014-05-25 (×9): 5 [IU] via SUBCUTANEOUS
  Filled 2014-05-17 (×9): qty 0.05

## 2014-05-17 MED ORDER — CIPROFLOXACIN HCL 250 MG PO TABS
250.0000 mg | ORAL_TABLET | ORAL | Status: AC
Start: 2014-05-17 — End: 2014-05-23
  Administered 2014-05-17 – 2014-05-23 (×7): 250 mg via ORAL
  Filled 2014-05-17 (×7): qty 1

## 2014-05-17 NOTE — IPOC Note (Signed)
Overall Plan of Care [facility]) Patient Details Name: [name] MRN: [id number] DOB: August 11, 1933  Admitting Diagnosis: RT CVA PARIETAL LOBE INFARCT  Hospital Problems: Active Problems:   CVA (cerebral infarction)     Functional Problem List: Nursing Bladder;Bowel;Safety;Skin Integrity;Medication Management;Pain  PT Balance;Endurance;Motor;Perception;Safety;Sensory  OT Balance;Cognition;Endurance;Motor;Sensory;Safety;Perception;Vision  SLP Cognition;Safety  TR         Basic ADL's: OT Grooming;Bathing;Dressing;Toileting     Advanced  ADL's: OT       Transfers: PT Bed Mobility;Bed to Chair;Car;Furniture  OT Toilet;Tub/Shower     Locomotion: PT Ambulation;Wheelchair Mobility;Stairs     Additional Impairments: OT    SLP Social Cognition;Communication expression Attention;Problem Solving;Memory;Awareness  TR      Anticipated Outcomes Item Anticipated Outcome  Self Feeding set up  Swallowing      Basic self-care  supervision  Toileting  supervision   Bathroom Transfers supervision  Bowel/Bladder  cont of bowel and bladder  Transfers  Supervision Basic  Locomotion  Supervision with RW x 150  Communication  supervision   Cognition  min assist   Pain  less than 2  Safety/Judgment  adhere to safety protocol in place   Therapy Plan: PT Intensity: Minimum of 1-2 x/day ,45 to 90 minutes PT Frequency: 5 out of 7 days PT Duration Estimated Length of Stay: 10 days OT Intensity: Minimum of 1-2 x/day, 45 to 90 minutes OT Frequency: 5 out of 7 days OT Duration/Estimated Length of Stay: 10 days SLP Intensity: Minumum of 1-2 x/day, 30 to 90 minutes SLP Frequency: 5 out of 7 days SLP Duration/Estimated Length of Stay: 10-12 days        Team Interventions: Nursing Interventions Patient/Family Education;Bladder Management;Bowel Management;Pain Management;Medication Management;Skin Care/Wound Management;Psychosocial Support;Cognitive  Remediation/Compensation;Discharge Planning  PT interventions Ambulation/gait training;Balance/vestibular training;Community reintegration;Cognitive remediation/compensation;Discharge planning;Disease management/prevention;DME/adaptive equipment instruction;Functional mobility training;Neuromuscular re-education;Patient/family education;Psychosocial support;Stair training;Therapeutic Activities;UE/LE Strength taining/ROM;Therapeutic Exercise;UE/LE Coordination activities;Visual/perceptual remediation/compensation;Wheelchair propulsion/positioning  OT Interventions Balance/vestibular training;Cognitive remediation/compensation;Discharge planning;DME/adaptive equipment instruction;Functional mobility training;Patient/family education;Self Care/advanced ADL retraining;Therapeutic Activities;Therapeutic Exercise;UE/LE Strength taining/ROM;UE/LE Coordination activities;Visual/perceptual remediation/compensation  SLP Interventions Cognitive remediation/compensation;Cueing hierarchy;Functional tasks;Environmental controls;Internal/external aids;Patient/family education  TR Interventions    SW/CM Interventions Discharge Planning;Psychosocial Support;Patient/Family Education    Team Discharge Planning: Destination: PT-Home ,OT- Home , SLP-Home Projected Follow-up: PT-Home health PT;24 hour supervision/assistance, OT-  Home health OT, SLP-24 hour supervision/assistance;Home Health SLP Projected Equipment Needs: PT-None recommended by PT, OT- None recommended by OT, SLP-None recommended by SLP Equipment Details: PT-Pt has Transport chair and RW, OT-  Patient/family involved in discharge planning: PT- Patient;Family member/caregiver,  OT-Patient;Family member/caregiver, SLP-Patient;Family member/caregiver  MD ELOS: 10 days Medical Rehab Prognosis:  Excellent Assessment: The patient has been admitted for CIR therapies with the diagnosis of right parietal CVA. The team will be addressing functional mobility,  strength, stamina, balance, safety, adaptive techniques and equipment, self-care, bowel and bladder mgt, patient and caregiver education, NMR, cognitive perceptual awareness, communication, nutrition, egosupport, leisure awareness. Goals have been set at supervision for basic mobility and self-care and min assist for cognition ---pt has baseline dementia.    Meredith Staggers, MD, FAAPMR      See Team Conference Notes for weekly updates to the plan of care

## 2014-05-17 NOTE — Progress Notes (Signed)
Speech Language Pathology Daily Session Note  Patient Details  Name: [name] MRN: [id number] Date of Birth: [birth year]  Today's Date: 05/17/2014 Time: 0902-1002 Time Calculation (min): 60 min  Short Term Goals: Week 1: SLP Short Term Goal 1 (Week 1): Pt will sustain attention to functional tasks for 5-7 mintues with min cuing for redirection.  SLP Short Term Goal 2 (Week 1): Pt will improve basic problem solving during functional tasks for 80% accuracy with mod assist  SLP Short Term Goal 3 (Week 1): Pt will use external aids to facilitate improved recall of daily events and information for 80% accuracy with mod assist.   Skilled Therapeutic Interventions:  Pt was seen for skilled speech therapy targeting cognitive goals.    Pt was asleep in bed upon arrival but was arousable to light touch and voice and agreeable to participate in speech therapy.  Pt required min assist to initiate and sequence transfers from bed to wheelchair and from wheelchair to toilet.  Pt was continent of bladder and required min assist to sequence and initiate hygiene and clothing management.  Pt required overall max assist to reorient to date and situation with the use of choice of three and written aids, pt was oriented to place with supervision question cues.  SLP facilitated session with a structured sorting task with pt able to sort and count coins with overall mod assist verbal and visual cues.  Pt sustained attention to therapeutic tasks for ~3 minutes before requiring min-mod cues for redirection or to correct perseveration.  SLP also facilitated session with a structured new learning activity targeting basic problem solving with overall max assist verbal cues.  SLP provided pt's son with updates regarding pt's progress and current goals for therapy and provided skilled education related to difficulty learning new information in the setting of decreased sustained attention.   FIM:  Comprehension Comprehension  Mode: Auditory Comprehension: 3-Understands basic 50 - 74% of the time/requires cueing 25 - 50%  of the time Expression Expression Mode: Verbal Expression: 3-Expresses basic 50 - 74% of the time/requires cueing 25 - 50% of the time. Needs to repeat parts of sentences. Social Interaction Social Interaction: 4-Interacts appropriately 75 - 89% of the time - Needs redirection for appropriate language or to initiate interaction. Problem Solving Problem Solving: 3-Solves basic 50 - 74% of the time/requires cueing 25 - 49% of the time Memory Memory: 2-Recognizes or recalls 25 - 49% of the time/requires cueing 51 - 75% of the time  Pain Pain Assessment Pain Assessment: No/denies pain  Therapy/Group: Individual Therapy  Windell Moulding, M.A. CCC-SLP  Naheim Burgen, Selinda Orion 05/17/2014, 12:24 PM

## 2014-05-17 NOTE — Progress Notes (Signed)
Physical Therapy Session Note  Patient Details  Name: [name] MRN: [id number] Date of Birth: [birth year]  Today's Date: 05/17/2014 Time: 1430-1530 Time Calculation (min): 60 min  Short Term Goals: Week 1:  PT Short Term Goal 1 (Week 1): STG=LTG due to ELOS, S overall with RW  Skilled Therapeutic Interventions/Progress Updates:    W/C Management: PT instructs pt in self propulsion of w/c with B UE - req repeated verbal cues to use L UE as pt tries to rest this one in her lap, x 50' at a time, for a total of 150' req up to mod A for power.   Gait Training: PT instructs pt in ambulation 40' x 2 reps and again x 50' req CGA and w/c follow for safety. Pt demonstrates ataxic gait, evidenced by R LE tremor > L LE tremor - no LOB req assist to correct.  PT instructs pt in ascending/descending curb step x 3 reps with RW req min A to ascend with R LE and min A to descend safely with L LE.   Therapeutic Exercise: PT instructs pt on Nu-Step with B LE on L3 x 7 minutes req rest breaks due to fatigue. Pt req CGA to transfer on/off Nu-step with RW and single step cues to participate in setting up on Nu-step.   Pt continues to have low activity tolerance and generalized deconditioning. Pt's cognitive deficits impair her ability to learn new material. Pt will benefit from continued stair training and ambulation/transfer training with RW.   Therapy Documentation Precautions:  Precautions Precautions: Fall Restrictions Weight Bearing Restrictions: No    Pain: Pain Assessment Pain Assessment: 0-10 Pain Score: 6  Pain Type: Neuropathic pain Pain Location: Foot Pain Orientation: Left Pain Onset: Sudden Pain Intervention(s): Rest Multiple Pain Sites: No  See FIM for current functional status  Therapy/Group: Individual Therapy  Blayre Papania M 05/17/2014, 2:44 PM

## 2014-05-17 NOTE — Progress Notes (Signed)
Occupational Therapy Session Note  Patient Details  Name: [name] MRN: [id number] Date of Birth: 1932/11/04  Today's Date: 05/17/2014 Time: 1003-1103 Time Calculation (min): 60 min  Short Term Goals: Week 1:  OT Short Term Goal 1 (Week 1): Pt will be able to ambulate in and out of the bathroom with min A with RW. OT Short Term Goal 2 (Week 1): Pt will be able to wash her feet using a long handled sponge. OT Short Term Goal 3 (Week 1): Pt will be able to don her socks and shoes with supervision.  Skilled Therapeutic Interventions/Progress Updates:    Pt seen for BADL retraining of B/d at sink level (pt choice) with a focus on dynamic standing balance, sequencing, attention to task, and activity tolerance. Pt initially participated extremely well engaging in grooming, bathing and UB dressing with virtually no cuing needed for initiation or sequencing. During LB, pt was confused about orientation of the pants convinced that they were backwards when they were not and was perseverating on that.  Pt eventually donned her pants with out A. She began to lose focus, closing her eyes and "phasing out". Pt needed cues to attend to task to complete donning shoes. Pt was having more difficulty today. Elastic laces put in shoes, but the tongue of the shoe kept pushing down as she tried to put them on.  Pt was agreeable to working on walking with RW 2x out into the hallway. She stood from chair with no A and then ambulated with only steady to min A. Pt adjusted in w/c with QRB and call light. Her son had returned to room after self care was able to observe pt ambulating.  Therapy Documentation Precautions:  Precautions Precautions: Fall Restrictions Weight Bearing Restrictions: No       Pain: Pain Assessment Pain Assessment: No/denies pain ADL: ADL ADL Comments: refer to FIM  See FIM for current functional status  Therapy/Group: Individual Therapy  [name] 05/17/2014, 11:52 AM

## 2014-05-17 NOTE — Patient Care Conference (Signed)
Inpatient RehabilitationTeam Conference and Plan of Care Update Date: 05/17/2014   Time: 10;00 AM    Patient Name: [name]      Medical Record Number: [id number]  Date of Birth: Apr 11, 1933 Sex: Female         Room/Bed: 4W03C/4W03C-01 Payor Info: Payor: MEDICARE / Plan: MEDICARE PART A AND B / Product Type: *No Product type* /    Admitting Diagnosis: RT CVA PARIETAL LOBE INFARCT  Admit Date/Time:  05/15/2014  3:24 PM Admission Comments: No comment available   Primary Diagnosis:  <principal problem not specified> Principal Problem: <principal problem not specified>  Patient Active Problem List   Diagnosis Date Noted  . Angiodysplasia of intestine with hemorrhage 05/13/2014  . Acute ischemic stroke 05/11/2014  . Anemia 05/10/2014  . GI bleed 05/10/2014  . Melena 05/10/2014  . Ataxia, late effect of cerebrovascular disease 03/23/2014  . Atrial fibrillation 03/02/2014  . CVA (cerebral infarction) 02/10/2014  . Erosive gastritis with hemorrhage 02/08/2014  . Acute bilat watershed infarction vs. embolic strokes 76/28/3151  . Chronic diastolic CHF EF 76% 16/04/3709  . NSTEMI (non-ST elevated myocardial infarction) 02/04/2014  . Nonischemic cardiomyopathy 12/12/2013  . ARF (acute renal failure) 12/05/2013  . Memory difficulties 12/04/2013  . Pulmonary edema 12/03/2013  . Acute diastolic CHF (congestive heart failure) 12/03/2013  . Irregular heart rhythm 02/26/2012  . Ventricular bigeminy 02/26/2012  . CAD (coronary artery disease)   . CEREBRAL EMBOLISM, WITH INFARCTION 07/02/2010  . OSTEOPOROSIS 03/21/2009  . DEGENERATIVE JOINT DISEASE 07/24/2008  . INSOMNIA-SLEEP DISORDER-UNSPEC 07/24/2008  . Iron deficiency anemia secondary to blood loss (chronic) 01/06/2008  . OBSTRUCTIVE SLEEP APNEA 01/06/2008  . GASTROPARESIS, DIABETIC 08/03/2006  . HYPERLIPIDEMIA 08/03/2006  . PERIPHERAL NEUROPATHY 08/03/2006  . HYPERTENSION 08/03/2006  . Chronic diastolic congestive heart failure  08/03/2006  . REACTIVE AIRWAY DISEASE 08/03/2006  . GERD 08/03/2006  . DM (diabetes mellitus), type 2, uncontrolled 11/04/1983    Expected Discharge Date: Expected Discharge Date: 05/25/14  Team Members Present: Physician leading conference: Dr. Alger Simons Social Worker Present: Ovidio Kin, LCSW Nurse Present: Elliot Cousin, RN PT Present: Billie Ruddy, PT OT Present: Simonne Come, Rhetta Mura, OT SLP Present: Windell Moulding, SLP PPS Coordinator present : Ileana Ladd, Lelan Pons, RN, CRRN     Current Status/Progress Goal Weekly Team Focus  Medical   new right CVA, baseline dementia, low grade temp, urine pending (don't think temp is from pulmonary source)  improve safety awareness, activity tolerance  temperature dx/rx, safety, bp control   Bowel/Bladder   Cont of bowel and bladder, Mag Ox 400mg   Continue to stay continent of bowel and bladder and free of infection and skin breakdown  Assess bowel and bladder movements qshift.   Swallow/Nutrition/ Hydration     WFL        ADL's   mod A overall  min A overall  ADL retraining, functional mobility training, strengthening, pt/familiy education   Mobility   Mod/Max A transfer, S bed mobility, Mod A short distance gait with RW, Mod A 3 stairs  S all mobility including gait x150' with RW, Min A 2 steps  strengthening/activity tolernace, neuro re-ed, gait wtih RW, stairs   Communication   mod assist   min assist   initiation of functional communication    Safety/Cognition/ Behavioral Observations  mod-max assist   min assist   sustained attention, orientation, recall of daily events and information    Pain   Pain level 0  keep pt free of  pain or pain level <3  Monitor pt for verbal and non verbal signs of pain.   Skin   No skin issues  Keep skin free of infection and breakdown  Monitor skin qshift      *See Care Plan and progress notes for long and short-term goals.  Barriers to Discharge: prior deficits,  dementia    Possible Resolutions to Barriers:  supervision, NMR, adaptive techniques and equipment    Discharge Planning/Teaching Needs:  Home with son and PCS caregiver-were providing 24 hr supervision prior to admission      Team Discussion:  Check UA today.  Decreased attention form past stroke and dementia carry over is poor.  Son here daily with pt. Should reach supervision level goals.  Revisions to Treatment Plan:  None   Continued Need for Acute Rehabilitation Level of Care: The patient requires daily medical management by a physician with specialized training in physical medicine and rehabilitation for the following conditions: Daily direction of a multidisciplinary physical rehabilitation program to ensure safe treatment while eliciting the highest outcome that is of practical value to the patient.: Yes Daily medical management of patient stability for increased activity during participation in an intensive rehabilitation regime.: Yes Daily analysis of laboratory values and/or radiology reports with any subsequent need for medication adjustment of medical intervention for : Neurological problems;Cardiac problems  [name], Gardiner Rhyme 05/17/2014, 11:59 AM

## 2014-05-17 NOTE — Progress Notes (Signed)
Social Work Patient ID: [name], female   DOB: 28-Oct-1932, 80 y.o.   MRN: [id number] Met with pt and son to discuss team conference goals of supervsion level and targeted discharge date of 8/13. Son reports it is his [birth year] that day.  Both agreeable to this plan and hopeful she will get to supervision level,  Son and caregivers will resume care once discharged.

## 2014-05-17 NOTE — Progress Notes (Signed)
Social Work Elease Hashimoto, LCSW Social Worker Signed  Patient Care Conference Service date: 05/17/2014 11:59 AM  Inpatient RehabilitationTeam Conference and Plan of Care Update Date: 05/17/2014   Time: 10;00 AM     Patient Name: [name]       Medical Record Number: [id number]   Date of Birth: 1932-12-28 Sex: Female         Room/Bed: 4W03C/4W03C-01 Payor Info: Payor: MEDICARE / Plan: MEDICARE PART A AND B / Product Type: *No Product type* /   Admitting Diagnosis: RT CVA PARIETAL LOBE INFARCT   Admit Date/Time:  05/15/2014  3:24 PM Admission Comments: No comment available   Primary Diagnosis:  <principal problem not specified> Principal Problem: <principal problem not specified>    Patient Active Problem List     Diagnosis  Date Noted   .  Angiodysplasia of intestine with hemorrhage  05/13/2014   .  Acute ischemic stroke  05/11/2014   .  Anemia  05/10/2014   .  GI bleed  05/10/2014   .  Melena  05/10/2014   .  Ataxia, late effect of cerebrovascular disease  03/23/2014   .  Atrial fibrillation  03/02/2014   .  CVA (cerebral infarction)  02/10/2014   .  Erosive gastritis with hemorrhage  02/08/2014   .  Acute bilat watershed infarction vs. embolic strokes  86/76/1950   .  Chronic diastolic CHF EF 93%  26/71/2458   .  NSTEMI (non-ST elevated myocardial infarction)  02/04/2014   .  Nonischemic cardiomyopathy  12/12/2013   .  ARF (acute renal failure)  12/05/2013   .  Memory difficulties  12/04/2013   .  Pulmonary edema  12/03/2013   .  Acute diastolic CHF (congestive heart failure)  12/03/2013   .  Irregular heart rhythm  02/26/2012   .  Ventricular bigeminy  02/26/2012   .  CAD (coronary artery disease)     .  CEREBRAL EMBOLISM, WITH INFARCTION  07/02/2010   .  OSTEOPOROSIS  03/21/2009   .  DEGENERATIVE JOINT DISEASE  07/24/2008   .  INSOMNIA-SLEEP DISORDER-UNSPEC  07/24/2008   .  Iron deficiency anemia secondary to blood loss (chronic)  01/06/2008   .  OBSTRUCTIVE SLEEP  APNEA  01/06/2008   .  GASTROPARESIS, DIABETIC  08/03/2006   .  HYPERLIPIDEMIA  08/03/2006   .  PERIPHERAL NEUROPATHY  08/03/2006   .  HYPERTENSION  08/03/2006   .  Chronic diastolic congestive heart failure  08/03/2006   .  REACTIVE AIRWAY DISEASE  08/03/2006   .  GERD  08/03/2006   .  DM (diabetes mellitus), type 2, uncontrolled  11/04/1983     Expected Discharge Date: Expected Discharge Date: 05/25/14  Team Members Present: Physician leading conference: Dr. Alger Simons Social Worker Present: Ovidio Kin, LCSW Nurse Present: Elliot Cousin, RN PT Present: Billie Ruddy, PT OT Present: Simonne Come, Rhetta Mura, OT SLP Present: Windell Moulding, SLP PPS Coordinator present : Ileana Ladd, Lelan Pons, RN, CRRN        Current Status/Progress  Goal  Weekly Team Focus   Medical     new right CVA, baseline dementia, low grade temp, urine pending (don't think temp is from pulmonary source)  improve safety awareness, activity tolerance  temperature dx/rx, safety, bp control   Bowel/Bladder     Cont of bowel and bladder, Mag Ox 400mg   Continue to stay continent of bowel and bladder and free of infection and skin breakdown  Assess bowel and bladder movements qshift.   Swallow/Nutrition/ Hydration     WFL       ADL's     mod A overall  min A overall  ADL retraining, functional mobility training, strengthening, pt/familiy education   Mobility     Mod/Max A transfer, S bed mobility, Mod A short distance gait with RW, Mod A 3 stairs  S all mobility including gait x150' with RW, Min A 2 steps  strengthening/activity tolernace, neuro re-ed, gait wtih RW, stairs   Communication     mod assist   min assist   initiation of functional communication    Safety/Cognition/ Behavioral Observations    mod-max assist   min assist   sustained attention, orientation, recall of daily events and information    Pain     Pain level 0  keep pt free of pain or pain level <3  Monitor pt for verbal  and non verbal signs of pain.   Skin     No skin issues  Keep skin free of infection and breakdown  Monitor skin qshift     *See Care Plan and progress notes for long and short-term goals.    Barriers to Discharge:  prior deficits, dementia      Possible Resolutions to Barriers:    supervision, NMR, adaptive techniques and equipment      Discharge Planning/Teaching Needs:    Home with son and PCS caregiver-were providing 24 hr supervision prior to admission      Team Discussion:    Check UA today.  Decreased attention form past stroke and dementia carry over is poor.  Son here daily with pt. Should reach supervision level goals.   Revisions to Treatment Plan:    None    Continued Need for Acute Rehabilitation Level of Care: The patient requires daily medical management by a physician with specialized training in physical medicine and rehabilitation for the following conditions: Daily direction of a multidisciplinary physical rehabilitation program to ensure safe treatment while eliciting the highest outcome that is of practical value to the patient.: Yes Daily medical management of patient stability for increased activity during participation in an intensive rehabilitation regime.: Yes Daily analysis of laboratory values and/or radiology reports with any subsequent need for medication adjustment of medical intervention for : Neurological problems;Cardiac problems  Alarik Radu, Gardiner Rhyme 05/17/2014, 11:59 AM         Elease Hashimoto, LCSW Social Worker Signed  Patient Care Conference Service date: 02/15/2014 1:58 PM  Inpatient RehabilitationTeam Conference and Plan of Care Update Date: 02/15/2014   Time: 10;45 Am     Patient Name: [name]       Medical Record Number: [id number]   Date of Birth: Jun 24, 1933 Sex: Female         Room/Bed: 4W10C/4W10C-01 Payor Info: Payor: MEDICARE / Plan: MEDICARE PART A AND B / Product Type: *No Product type* /   Admitting Diagnosis: CVA   Admit  Date/Time:  02/10/2014  3:37 PM Admission Comments: No comment available   Primary Diagnosis:  <principal problem not specified> Principal Problem: <principal problem not specified>    Patient Active Problem List     Diagnosis  Date Noted   .  CVA (cerebral infarction)  02/10/2014   .  Acute embolic stroke  41/96/2229   .  Erosive gastritis with hemorrhage  02/08/2014   .  Acute bilat watershed infarction vs. embolic strokes  79/89/2119   .  Chronic diastolic CHF EF  45%  02/04/2014   .  Non-ST elevation MI (NSTEMI)  02/04/2014   .  Renal failure, acute  02/04/2014   .  NSTEMI (non-ST elevated myocardial infarction)  02/04/2014   .  Nonischemic cardiomyopathy  12/12/2013   .  ARF (acute renal failure)  12/05/2013   .  Memory difficulties  12/04/2013   .  Pulmonary edema  12/03/2013   .  Acute diastolic CHF (congestive heart failure)  12/03/2013   .  URI (upper respiratory infection)  03/02/2012   .  Irregular heart rhythm  02/26/2012   .  Ventricular bigeminy  02/26/2012   .  GI bleed  08/24/2011   .  CAD (coronary artery disease)     .  CEREBRAL EMBOLISM, WITH INFARCTION  07/02/2010   .  OSTEOPOROSIS  03/21/2009   .  DEGENERATIVE JOINT DISEASE  07/24/2008   .  INSOMNIA-SLEEP DISORDER-UNSPEC  07/24/2008   .  ANEMIA  01/06/2008   .  OBSTRUCTIVE SLEEP APNEA  01/06/2008   .  GASTROPARESIS, DIABETIC  08/03/2006   .  HYPERLIPIDEMIA  08/03/2006   .  PERIPHERAL NEUROPATHY  08/03/2006   .  HYPERTENSION  08/03/2006   .  Chronic diastolic congestive heart failure  08/03/2006   .  REACTIVE AIRWAY DISEASE  08/03/2006   .  GERD  08/03/2006   .  DM (diabetes mellitus), type 2, uncontrolled  11/04/1983     Expected Discharge Date: Expected Discharge Date: 02/18/14  Team Members Present: Physician leading conference: Dr. Alysia Penna Social Worker Present: Ovidio Kin, LCSW Nurse Present: Elliot Cousin, RN PT Present: Billie Ruddy, PT;Caroline Lacinda Axon, PT OT Present: Willeen Cass,  OT SLP Present: Gunnar Fusi, SLP PPS Coordinator present : Ileana Ladd, PT        Current Status/Progress  Goal  Weekly Team Focus   Medical     Somnolent during the morning, a phasic, cognitive deficits, UTI  Identify and treat reversible causes of cognitive deficits  Initiate X. and monitor for cognitive improvement   Bowel/Bladder     Continent of bowel and bladder with timed toileting to bathroom. LBM 02/13/14 strict I/O's  cont Bowel and bladder  cont to monitor bowel and bladder q shift   Swallow/Nutrition/ Hydration     regular and thin with full supervision   impacted by cognition   family education    ADL's     min assist basic self care skills  supervision  balance, right sided attention   Mobility     Min A overall  Supervision overall  Activity tolerance, safety awareness, hands-on family training/education, R NMR, transfers, gait, obstacle negotiation on R side during mobility   Communication     Max assist   Mod-Max assist   increase initiation    Safety/Cognition/ Behavioral Observations    Max assist   Max assist   increase initiation and family education    Pain     no c/o pain  pain less than or equal to 2  monitor pain q shift   Skin     no skin break down   no new skin breakdown this admission  cont to monitor pain q shift    Rehab Goals Patient on target to meet rehab goals: Yes *See Care Plan and progress notes for long and short-term goals.    Barriers to Discharge:  Cognitive deficits      Possible Resolutions to Barriers:    Repetitive training  Discharge Planning/Teaching Needs:    SOn wants to take pt home and mange from there.  He is here daily and observing pt in therapies.  Arranging PCS services and will place on [facility] waiting list.      Team Discussion:    Treating for UTI-bad day yesterday.  Son here daily and observing.  Right sided inattention and cognitive issues-interfere with progress.  Will have good days and bad days.      Revisions to Treatment Plan:    None    Continued Need for Acute Rehabilitation Level of Care: The patient requires daily medical management by a physician with specialized training in physical medicine and rehabilitation for the following conditions: Daily direction of a multidisciplinary physical rehabilitation program to ensure safe treatment while eliciting the highest outcome that is of practical value to the patient.: Yes Daily medical management of patient stability for increased activity during participation in an intensive rehabilitation regime.: Yes Daily analysis of laboratory values and/or radiology reports with any subsequent need for medication adjustment of medical intervention for : Neurological problems;Other  Gardiner Rhyme Dannis Deroche 02/15/2014, 1:59 PM          Patient ID: [name], female   DOB: June 14, 1933, 80 y.o.   MRN: [id number]

## 2014-05-17 NOTE — Progress Notes (Signed)
[facility] PHYSICAL MEDICINE & REHABILITATION     PROGRESS NOTE    Subjective/Complaints: No complaints. Started trying to get oob on her own because she needed to use the bathroom (as I was entering) ROS limited due to language/cognition  Objective: Vital Signs: Blood pressure 160/60, pulse 69, temperature 98 F (36.7 C), temperature source Oral, resp. rate 18, height 5' 2.5" (1.588 m), weight 72.5 kg (159 lb 13.3 oz), last menstrual period 12/19/1968, SpO2 100.00%. No results found.  Recent Labs  05/15/14 0254 05/16/14 1105  WBC 12.1* 12.9*  HGB 9.1* 10.5*  HCT 28.1* 33.8*  PLT 339 316    Recent Labs  05/15/14 0254 05/16/14 0755  NA 134* 140  K 4.5 4.9  CL 104 108  GLUCOSE 169* 76  BUN 22 20  CREATININE 1.48* 1.53*  CALCIUM 8.1* 8.5   CBG (last 3)   Recent Labs  05/16/14 1638 05/16/14 2146 05/17/14 0743  GLUCAP 226* 210* 159*    Wt Readings from Last 3 Encounters:  05/15/14 72.5 kg (159 lb 13.3 oz)  05/10/14 70.7 kg (155 lb 13.8 oz)  05/10/14 70.7 kg (155 lb 13.8 oz)    Physical Exam:  Gen: a little slow to arouse HENT: oral mucosa moist  Head: Normocephalic.  Eyes: EOM are normal.  Neck: Normal range of motion. Neck supple. No thyromegaly present.  Cardiovascular:  Cardiac rate controlled  Respiratory: Effort normal and breath sounds normal. No respiratory distress.  GI: Soft. Bowel sounds are normal. She exhibits no distension.  Neurological: She is alert.  Expressive language deficits. She will answer some simple yes no questions but inaccurate. Follows some simple demonstrated commands . Limitations in left sided motor vs right but Manual muscle testing difficult to assess secondary to aphasia and inability to follow commands. Senses pain in all 4's.   Skin: Skin is warm and dry  Psych: flat,  cooperative   Assessment/Plan: 1. Functional deficits secondary to right parietal lobe infarct which require 3+ hours per day of interdisciplinary  therapy in a comprehensive inpatient rehab setting. Physiatrist is providing close team supervision and 24 hour management of active medical problems listed below. Physiatrist and rehab team continue to assess barriers to discharge/monitor patient progress toward functional and medical goals. FIM: FIM - Bathing Bathing Steps Patient Completed: Chest;Right Arm;Left Arm;Abdomen;Buttocks;Front perineal area;Right upper leg;Left upper leg Bathing: 4: Min-Patient completes 8-9 74f 10 parts or 75+ percent  FIM - Upper Body Dressing/Undressing Upper body dressing/undressing steps patient completed: Thread/unthread right sleeve of pullover shirt/dresss;Thread/unthread left sleeve of pullover shirt/dress;Put head through opening of pull over shirt/dress Upper body dressing/undressing: 4: Min-Patient completed 75 plus % of tasks FIM - Lower Body Dressing/Undressing Lower body dressing/undressing steps patient completed: Thread/unthread right pants leg;Thread/unthread left pants leg;Don/Doff right sock;Don/Doff right shoe;Fasten/unfasten right shoe Lower body dressing/undressing: 3: Mod-Patient completed 50-74% of tasks  FIM - Toileting Toileting steps completed by patient: Performs perineal hygiene Toileting: 2: Max-Patient completed 1 of 3 steps  FIM - Radio producer Devices: Grab bars Toilet Transfers: 4-To toilet/BSC: Min A (steadying Pt. > 75%);4-From toilet/BSC: Min A (steadying Pt. > 75%)  FIM - Bed/Chair Transfer Bed/Chair Transfer Assistive Devices: Arm rests Bed/Chair Transfer: 5: Supine > Sit: Supervision (verbal cues/safety issues);2: Bed > Chair or W/C: Max A (lift and lower assist);3: Chair or W/C > Bed: Mod A (lift or lower assist)  FIM - Locomotion: Wheelchair Distance: 2 Locomotion: Wheelchair: 1: Total Assistance/staff pushes wheelchair (Pt<25%) FIM - Locomotion: Ambulation  Locomotion: Ambulation Assistive Devices: Administrator Ambulation/Gait  Assistance: 4: Min assist Locomotion: Ambulation: 1: Travels less than 50 ft with moderate assistance (Pt: 50 - 74%)  Comprehension Comprehension Mode: Auditory Comprehension: 2-Understands basic 25 - 49% of the time/requires cueing 51 - 75% of the time  Expression Expression Mode: Verbal Expression: 3-Expresses basic 50 - 74% of the time/requires cueing 25 - 50% of the time. Needs to repeat parts of sentences.  Social Interaction Social Interaction: 4-Interacts appropriately 75 - 89% of the time - Needs redirection for appropriate language or to initiate interaction.  Problem Solving Problem Solving: 3-Solves basic 50 - 74% of the time/requires cueing 25 - 49% of the time  Memory Memory: 2-Recognizes or recalls 25 - 49% of the time/requires cueing 51 - 75% of the time  Medical Problem List and Plan:  1. Functional deficits secondary to right parietal lobe infarct as well as history of CVA May of 2015  2. DVT Prophylaxis/Anticoagulation: SCDs. Monitor for any signs of DVT  3. Pain Management: Tylenol as needed  4. Mood/dementia: Patient appears at baseline  5. Neuropsych: This patient is not capable of making decisions on her own behalf.  6. Skin/Wound Care: Routine skin checks  7. GI bleed/AV malformation. Followup gastroscopy services. Monitor CBC. Continue Protonix daily  8. Diabetes mellitus with peripheral neuropathy. Hemoglobin A1c 7.0. Lantus insulin 8 units qhs   -more elevated in evening---add am lantus 5u 9. Hypertension. Toprol-XL 25 mg daily, lisinopril 5 mg daily, Imdur 60 mg daily.   -may need further titration if elevation is persistent (generally better) 10. Hyperlipidemia. Lipitor  11. Chronic renal insufficiency. Baseline creatinine 2.98. Followup chemistries  12. Diastolic congestive heart failure. Monitor for any signs of fluid overload  13. Low grade temp  -IS  -need UA/CX  LOS (Days) 2 A FACE TO FACE EVALUATION WAS PERFORMED  SWARTZ,ZACHARY T 05/17/2014  9:21 AM

## 2014-05-18 ENCOUNTER — Inpatient Hospital Stay (HOSPITAL_COMMUNITY): Payer: Self-pay | Admitting: Rehabilitation

## 2014-05-18 ENCOUNTER — Inpatient Hospital Stay (HOSPITAL_COMMUNITY): Payer: Self-pay | Admitting: Speech Pathology

## 2014-05-18 ENCOUNTER — Inpatient Hospital Stay (HOSPITAL_COMMUNITY): Payer: Medicare Other | Admitting: Occupational Therapy

## 2014-05-18 LAB — GLUCOSE, CAPILLARY
GLUCOSE-CAPILLARY: 161 mg/dL — AB (ref 70–99)
GLUCOSE-CAPILLARY: 215 mg/dL — AB (ref 70–99)
GLUCOSE-CAPILLARY: 238 mg/dL — AB (ref 70–99)
Glucose-Capillary: 180 mg/dL — ABNORMAL HIGH (ref 70–99)

## 2014-05-18 NOTE — Progress Notes (Signed)
[place] PHYSICAL MEDICINE & REHABILITATION     PROGRESS NOTE    Subjective/Complaints: A little restless last night. Didn't sleep real well. Up in w/c with waist belt this am. ROS limited due to language/cognition  Objective: Vital Signs: Blood pressure 125/57, pulse 70, temperature 99.4 F (37.4 C), temperature source Oral, resp. rate 19, height 5' 2.5" (1.588 m), weight 70.398 kg (155 lb 3.2 oz), last menstrual period 12/19/1968, SpO2 97.00%. No results found.  Recent Labs  05/16/14 1105  WBC 12.9*  HGB 10.5*  HCT 33.8*  PLT 316    Recent Labs  05/16/14 0755  NA 140  K 4.9  CL 108  GLUCOSE 76  BUN 20  CREATININE 1.53*  CALCIUM 8.5   CBG (last 3)   Recent Labs  05/17/14 1625 05/17/14 2109 05/18/14 0715  GLUCAP 205* 173* 161*    Wt Readings from Last 3 Encounters:  05/18/14 70.398 kg (155 lb 3.2 oz)  05/10/14 70.7 kg (155 lb 13.8 oz)  05/10/14 70.7 kg (155 lb 13.8 oz)    Physical Exam:  Gen: a little slow to arouse HENT: oral mucosa moist  Head: Normocephalic.  Eyes: EOM are normal.  Neck: Normal range of motion. Neck supple. No thyromegaly present.  Cardiovascular:  Cardiac rate controlled  Respiratory: Effort normal and breath sounds normal. No respiratory distress.  GI: Soft. Bowel sounds are normal. She exhibits no distension.  Neurological: She is alert.  Expressive language deficits. She will answer some simple yes no questions but inaccurate. Follows some simple demonstrated commands . Limitations in left sided motor vs right but Manual muscle testing difficult to assess secondary to aphasia and inability to follow commands. Senses pain in all 4's.   Skin: Skin is warm and dry  Psych: flat,  cooperative   Assessment/Plan: 1. Functional deficits secondary to right parietal lobe infarct which require 3+ hours per day of interdisciplinary therapy in a comprehensive inpatient rehab setting. Physiatrist is providing close team supervision and  24 hour management of active medical problems listed below. Physiatrist and rehab team continue to assess barriers to discharge/monitor patient progress toward functional and medical goals. FIM: FIM - Bathing Bathing Steps Patient Completed: Chest;Right Arm;Left Arm;Abdomen;Buttocks;Front perineal area;Right upper leg;Left upper leg Bathing: 4: Min-Patient completes 8-9 80f 10 parts or 75+ percent  FIM - Upper Body Dressing/Undressing Upper body dressing/undressing steps patient completed: Thread/unthread right sleeve of pullover shirt/dresss;Thread/unthread left sleeve of pullover shirt/dress;Put head through opening of pull over shirt/dress;Pull shirt over trunk Upper body dressing/undressing: 5: Set-up assist to: Obtain clothing/put away FIM - Lower Body Dressing/Undressing Lower body dressing/undressing steps patient completed: Thread/unthread right pants leg;Thread/unthread left pants leg;Don/Doff right sock;Pull pants up/down;Don/Doff left sock Lower body dressing/undressing: 3: Mod-Patient completed 50-74% of tasks  FIM - Toileting Toileting steps completed by patient: Performs perineal hygiene Toileting Assistive Devices: Grab bar or rail for support Toileting: 2: Max-Patient completed 1 of 3 steps  FIM - Radio producer Devices: Grab bars Toilet Transfers: 4-To toilet/BSC: Min A (steadying Pt. > 75%);4-From toilet/BSC: Min A (steadying Pt. > 75%)  FIM - Control and instrumentation engineer Devices: Walker;Arm rests Bed/Chair Transfer: 4: Chair or W/C > Bed: Min A (steadying Pt. > 75%)  FIM - Locomotion: Wheelchair Distance: 50 Locomotion: Wheelchair: 2: Travels 50 - 149 ft with moderate assistance (Pt: 50 - 74%) FIM - Locomotion: Ambulation Locomotion: Ambulation Assistive Devices: Administrator Ambulation/Gait Assistance: 4: Min guard Locomotion: Ambulation: 1: Travels less than 50  ft with minimal assistance  (Pt.>75%)  Comprehension Comprehension Mode: Auditory Comprehension: 3-Understands basic 50 - 74% of the time/requires cueing 25 - 50%  of the time  Expression Expression Mode: Verbal Expression: 3-Expresses basic 50 - 74% of the time/requires cueing 25 - 50% of the time. Needs to repeat parts of sentences.  Social Interaction Social Interaction: 4-Interacts appropriately 75 - 89% of the time - Needs redirection for appropriate language or to initiate interaction.  Problem Solving Problem Solving: 3-Solves basic 50 - 74% of the time/requires cueing 25 - 49% of the time  Memory Memory: 2-Recognizes or recalls 25 - 49% of the time/requires cueing 51 - 75% of the time  Medical Problem List and Plan:  1. Functional deficits secondary to right parietal lobe infarct as well as history of CVA May of 2015  2. DVT Prophylaxis/Anticoagulation: SCDs. Monitor for any signs of DVT  3. Pain Management: Tylenol as needed  4. Mood/dementia: Patient appears at baseline  5. Neuropsych: This patient is not capable of making decisions on her own behalf.  6. Skin/Wound Care: Routine skin checks  7. GI bleed/AV malformation. Followup gastroscopy services. Monitor CBC. Continue Protonix daily  8. Diabetes mellitus with peripheral neuropathy. Hemoglobin A1c 7.0. Lantus insulin 8 units qhs   -more elevated in evening---added am lantus 5u---titrate further as needed 9. Hypertension. Toprol-XL 25 mg daily, lisinopril 5 mg daily, Imdur 60 mg daily.   -may need further titration if elevation is persistent (generally better) 10. Hyperlipidemia. Lipitor  11. Chronic renal insufficiency. Baseline creatinine 2.98. Followup chemistries  12. Diastolic congestive heart failure. Monitor for any signs of fluid overload  13. Low grade temp  -IS  -need UA +/CX pending  -empiric cipro initiated (has hx of UTI's)  LOS (Days) 3 A FACE TO FACE EVALUATION WAS PERFORMED  Koben Daman T 05/18/2014 9:04 AM

## 2014-05-18 NOTE — Progress Notes (Signed)
Occupational Therapy Session Note  Patient Details  Name: [name] MRN: [id number] Date of Birth: 1933/08/28  Today's Date: 05/18/2014 Time: 0905-1000 Time Calculation (min): 55 min  Short Term Goals: Week 1:  OT Short Term Goal 1 (Week 1): Pt will be able to ambulate in and out of the bathroom with min A with RW. OT Short Term Goal 2 (Week 1): Pt will be able to wash her feet using a long handled sponge. OT Short Term Goal 3 (Week 1): Pt will be able to don her socks and shoes with supervision.  Skilled Therapeutic Interventions/Progress Updates:    Pt seen for BADL retraining of bathing and dressing with a focus on functional mobility using the RW. Pt was able to ambulate around the room using RW with steady A. Pt demonstrated improved initiation and activity tolerance today. She used long sponge for feet.  Pt continues to have difficulty donning shoes. Will try shoe horn tomorrow.  Pt ambulated for a short distance in hallway with RW.  Pt was then adjusted in her w/c with QRB and call light in reach.  Therapy Documentation Precautions:  Precautions Precautions: Fall Restrictions Weight Bearing Restrictions: No    Vital Signs: Therapy Vitals Pulse Rate: 70 BP: 125/57 mmHg Pain: Pain Assessment Pain Assessment: 0-10 Pain Score: 0-No pain ADL: ADL ADL Comments: refer to FIM  See FIM for current functional status  Therapy/Group: Individual Therapy  [place] 05/18/2014, 12:05 PM

## 2014-05-18 NOTE — Progress Notes (Signed)
Physical Therapy Session Note  Patient Details  Name: [name] MRN: [id number] Date of Birth: 11/13/1932  Today's Date: 05/18/2014 Time: 7371-0626 and 1100-1150 Time Calculation (min): 30 min and 50 mins  Short Term Goals: Week 1:  PT Short Term Goal 1 (Week 1): STG=LTG due to ELOS, S overall with RW  Skilled Therapeutic Interventions/Progress Updates:   First AM session:  Pt received sitting in w/c in room, son present and did observe pt ambulating.  Pt agreeable to session.  Skilled session focused on gait training with RW for improved quality and to increase activity tolerance.  Also address functional transfers and standing balance by toileting/washing/drying hands.  She is able to ambulate to restroom with close S, however min/guard for safety when crossing threshold, as she tends to place RW too far ahead of her.  Close S for sit<>stand and peri care.  Ambulated back to sink and stood at close S for hand hygiene.  Remainder of session focused on gait x 20' x1 and 25' x 1 with seated rest breaks in between due to fatigue.  Continues to require continuous cues for maintaining position inside of RW, upright posture and cues for motivation to continue with gait.  Pt assisted back to room and left in w/c with QRB donned and all needs in reach.   Second AM session:  Pt received sitting in w/c, agreeable to session.  Skilled session focused on gait training with RW, car transfers, stair negotiation and dynamic standing balance w/ bimanual task to also increase activity tolerance. Performed 30' x 1 and 20' x 1 with RW at close S level in controlled environment.  Continues to require same cues mentioned above.  Performed car transfer at min/guard level with max verbal and tactile cues for turning with RW, as she would leave RW to the side and reach for door/other grab bars for support.  Performed curb step x 2 reps with min A with max verbal cues for safety and technique.  Will have to get son to  assist soon for safety, as she will not likely be able to carryover technique from day to day.  Ended with standing task while folding towels.  Note pt with increased difficulty w/ bimanual task and difficulty orienting towel to fold and increased difficulty standing for longer than 1 min at a time.  Assisted pt back to room and left in w/c with all needs in reach and QRB donned.  Son in room.   Therapy Documentation Precautions:  Precautions Precautions: Fall Restrictions Weight Bearing Restrictions: No   Vital Signs: Therapy Vitals Pulse Rate: 70 BP: 125/57 mmHg Pain: Pain Assessment Pain Assessment: 0-10 Pain Score: 0-No pain     See FIM for current functional status  Therapy/Group: Individual Therapy  Denice Bors 05/18/2014, 11:41 AM

## 2014-05-18 NOTE — Progress Notes (Signed)
Speech Language Pathology Daily Session Note  Patient Details  Name: [name] MRN: [id number] Date of Birth: 1933-08-31  Today's Date: 05/18/2014 Time: 0071-2197 Time Calculation (min): 45 min  Short Term Goals: Week 1: SLP Short Term Goal 1 (Week 1): Pt will sustain attention to functional tasks for 5-7 mintues with min cuing for redirection.  SLP Short Term Goal 2 (Week 1): Pt will improve basic problem solving during functional tasks for 80% accuracy with mod assist  SLP Short Term Goal 3 (Week 1): Pt will use external aids to facilitate improved recall of daily events and information for 80% accuracy with mod assist.   Skilled Therapeutic Interventions:  Pt was seen for skilled speech therapy targeting cognitive goals.  Upon arrival, pt was upright in wheelchair, awake, alert, and agreeable to participate in [place].  SLP facilitated the session with a basic card game targeting problem solving with pt requiring overall max assist to complete secondary to decreased sustained attention, impulsivity, perseveration,and decreased working memory.  Pt was noted to sustain attention to task for periods of approximately 3-5 minutes before requiring mod-max assist to redirect.  Continue per current plan of care.    FIM:  Comprehension Comprehension Mode: Auditory Comprehension: 3-Understands basic 50 - 74% of the time/requires cueing 25 - 50%  of the time Expression Expression Mode: Verbal Expression: 3-Expresses basic 50 - 74% of the time/requires cueing 25 - 50% of the time. Needs to repeat parts of sentences. Social Interaction Social Interaction: 4-Interacts appropriately 75 - 89% of the time - Needs redirection for appropriate language or to initiate interaction. Problem Solving Problem Solving: 3-Solves basic 50 - 74% of the time/requires cueing 25 - 49% of the time Memory Memory: 2-Recognizes or recalls 25 - 49% of the time/requires cueing 51 - 75% of the time  Pain Pain  Assessment Pain Assessment: No/denies pain  Therapy/Group: Individual Therapy  Windell Moulding, M.A. CCC-SLP  Aariona Momon, Selinda Orion 05/18/2014, 5:33 PM

## 2014-05-19 ENCOUNTER — Encounter (HOSPITAL_COMMUNITY): Payer: Self-pay | Admitting: Occupational Therapy

## 2014-05-19 ENCOUNTER — Inpatient Hospital Stay (HOSPITAL_COMMUNITY): Payer: Self-pay | Admitting: Physical Therapy

## 2014-05-19 ENCOUNTER — Inpatient Hospital Stay (HOSPITAL_COMMUNITY): Payer: Medicare Other | Admitting: Speech Pathology

## 2014-05-19 LAB — URINE CULTURE: Colony Count: >=100000

## 2014-05-19 LAB — GLUCOSE, CAPILLARY
GLUCOSE-CAPILLARY: 149 mg/dL — AB (ref 70–99)
Glucose-Capillary: 124 mg/dL — ABNORMAL HIGH (ref 70–99)
Glucose-Capillary: 242 mg/dL — ABNORMAL HIGH (ref 70–99)
Glucose-Capillary: 167 mg/dL — ABNORMAL HIGH (ref 70–99)

## 2014-05-19 NOTE — Progress Notes (Signed)
Occupational Therapy Session Note  Patient Details  Name: [name] MRN: [id number] Date of Birth: [birth year]  Today's Date: 05/19/2014 Time: 0900-1000 Time Calculation (min): 60 min  Short Term Goals: Week 1:  OT Short Term Goal 1 (Week 1): Pt will be able to ambulate in and out of the bathroom with min A with RW. OT Short Term Goal 2 (Week 1): Pt will be able to wash her feet using a long handled sponge. OT Short Term Goal 3 (Week 1): Pt will be able to don her socks and shoes with supervision.  Skilled Therapeutic Interventions/Progress Updates:    Pt seen for BADL retraining of shower, groom and dress with a focus on balance and attention to task. Pt needed more verbal cues today as she had decreased initiation, decreased attention, and slower processing. Pt was close S to steady A with transfers and max cuing at times for proper use of RW.   Pt was continuing to have difficulty donning shoes. Elastic laces removes and regular laces put back in. Pt used shoe horn with max A.  LB dressing goal modified to pt having assist with shoes. Son stated she had help with that at home. Pt resting in w/c with family in the room.  Therapy Documentation Precautions:  Precautions Precautions: Fall Restrictions Weight Bearing Restrictions: No   Pain: Pain Assessment Pain Assessment: No/denies pain ADL: ADL ADL Comments: refer to FIM  See FIM for current functional status  Therapy/Group: Individual Therapy  [place] 05/19/2014, 11:24 AM

## 2014-05-19 NOTE — Progress Notes (Signed)
Speech Language Pathology Daily Session Note  Patient Details  Name: [name] MRN: [id number] Date of Birth: 02-Aug-1933  Today's Date: 05/19/2014 Time: 5790-3833 Time Calculation (min): 60 min  Short Term Goals: Week 1: SLP Short Term Goal 1 (Week 1): Pt will sustain attention to functional tasks for 5-7 mintues with min cuing for redirection.  SLP Short Term Goal 2 (Week 1): Pt will improve basic problem solving during functional tasks for 80% accuracy with mod assist  SLP Short Term Goal 3 (Week 1): Pt will use external aids to facilitate improved recall of daily events and information for 80% accuracy with mod assist.   Skilled Therapeutic Interventions: Skilled treatment session focused on cognitive-linguistic goals. Upon arrival, pt was asleep in bed and required Max verbal and tactile cues along with extra time for arousal. Pt was minimally participatory throughout the session and reported, "I don't feel like doing anything today." SLP facilitated session by providing Max multimodal cues for orientation to time and situation, however, the patient was independently oriented to place. The patient demonstrated perseveration throughout the session and required Max A multimodal cues to answer basic biographical questions, however, the patient was able to recall events from previous therapy sessions with Min question cues. Pt participated in a sorting task but required Max A multimodal cues for sustained attention and problem solving with the task, suspect due to fatigue. Pt left supine in bed with all needs within reach. Continue with current plan of care.    FIM:  Comprehension Comprehension Mode: Auditory Comprehension: 3-Understands basic 50 - 74% of the time/requires cueing 25 - 50%  of the time Expression Expression Mode: Verbal Expression: 2-Expresses basic 25 - 49% of the time/requires cueing 50 - 75% of the time. Uses single words/gestures. Social Interaction Social Interaction:  2-Interacts appropriately 25 - 49% of time - Needs frequent redirection. Problem Solving Problem Solving: 3-Solves basic 50 - 74% of the time/requires cueing 25 - 49% of the time Memory Memory: 2-Recognizes or recalls 25 - 49% of the time/requires cueing 51 - 75% of the time  Pain Pain Assessment Pain Assessment: No/denies pain  Therapy/Group: Individual Therapy  Lamiracle Chaidez, [place] 05/19/2014, 3:21 PM

## 2014-05-19 NOTE — Progress Notes (Signed)
Physical Therapy Session Note  Patient Details  Name: ADDALIE CALLES MRN: [id number] Date of Birth: Mar 30, 1933  Today's Date: 05/19/2014 Time: 8295-6213 Time Calculation (min): 60 min  Short Term Goals: Week 1:  PT Short Term Goal 1 (Week 1): STG=LTG due to ELOS, S overall with RW  Skilled Therapeutic Interventions/Progress Updates:    Pt received seated in w/c with son and daughter present. Pt agreeable to therapy. Session focused on initiation of hands-on family training with son, [name], and daughter, Fraser Din. Pt performed gait x127' in controlled environment with rolling walker and min A or son, subtle to min cueing for obstacle negotiation on R side. In ortho gym, pt negotiated single step without rails (to simulate home entrance) forward using rolling walker with min A x3 trials total. Initial trial with min A of son, SBA of this PT. Son required cueing for positioning (standing between pt and ground) when pt descending. Son gave effective return demonstration during subsequent trial. Daughter provided min A during final trial. Explained, demonstrated the following: car transfer with rolling walker and supervision, tactile cueing for safe hand placement; furniture transfer (sit<>stand from couch with rolling walker) with supervision; gait x15' in home environment (rehab apartment) with supervision; supine<>sit in rehab apartment bed (HOB flat, no rails) with supervision and verbal/tactile cueing at RLE for RLE management.   Son and daughter gave effective return demonstration of bed mobility and furniture transfer.  Son/daughter will require reinforcement with basic transfers, car transfer, and stair negotiation using rolling walker.  Pt left seated in w/c in pt room with son and daughter present and all needs within reach. Son/daughter verbalized understanding that pt should have quick release belt on when son/daughter not in room.  Therapy Documentation Precautions:  Precautions Precautions:  Fall Restrictions Weight Bearing Restrictions: No Pain: Pain Assessment Pain Assessment: No/denies pain Locomotion : Ambulation Ambulation/Gait Assistance: 5: Supervision;4: Min guard   See FIM for current functional status  Therapy/Group: Individual Therapy  Gerald Honea, Malva Cogan 05/19/2014, 11:11 AM

## 2014-05-19 NOTE — Progress Notes (Signed)
Patient has been running low grade temp, ranging between 99.3 & 99.7. Temp at 2150 on 05/18/2014, was 100.4. Marlowe [place], PA. notified at 2206. "Let it run its course and see what happens." Temp has since been in the normal range, 98.5, 98.7. Patient lying supine with eyes open. SR x3, bed alarm activated, call bell within reach. Karin Griffith A. Jacie Tristan, RN.

## 2014-05-19 NOTE — Progress Notes (Signed)
[facility] PHYSICAL MEDICINE & REHABILITATION     PROGRESS NOTE    Subjective/Complaints: No new complaints. Slept pretty well. Denies pain.  ROS limited due to language/cognition  Objective: Vital Signs: Blood pressure 160/62, pulse 75, temperature 98.5 F (36.9 C), temperature source Oral, resp. rate 18, height 5' 2.5" (1.588 m), weight 70.398 kg (155 lb 3.2 oz), last menstrual period 12/19/1968, SpO2 95.00%. No results found.  Recent Labs  05/16/14 1105  WBC 12.9*  HGB 10.5*  HCT 33.8*  PLT 316   No results found for this basename: NA, K, CL, CO, GLUCOSE, BUN, CREATININE, CALCIUM,  in the last 72 hours CBG (last 3)   Recent Labs  05/18/14 1633 05/18/14 2115 05/19/14 0720  GLUCAP 238* 180* 124*    Wt Readings from Last 3 Encounters:  05/18/14 70.398 kg (155 lb 3.2 oz)  05/10/14 70.7 kg (155 lb 13.8 oz)  05/10/14 70.7 kg (155 lb 13.8 oz)    Physical Exam:  Gen: a little slow to arouse HENT: oral mucosa moist  Head: Normocephalic.  Eyes: EOM are normal.  Neck: Normal range of motion. Neck supple. No thyromegaly present.  Cardiovascular:  Cardiac rate controlled  Respiratory: Effort normal and breath sounds normal. No respiratory distress.  GI: Soft. Bowel sounds are normal. She exhibits no distension.  Neurological: She is alert.  Expressive language deficits a little better. Follows some simple  commands . Limitations in left sided motor vs right but Manual muscle testing: 3-4/5 LUE and LLE.  Senses pain in all 4's.   Skin: Skin is warm and dry  Psych: flat,  cooperative   Assessment/Plan: 1. Functional deficits secondary to right parietal lobe infarct which require 3+ hours per day of interdisciplinary therapy in a comprehensive inpatient rehab setting. Physiatrist is providing close team supervision and 24 hour management of active medical problems listed below. Physiatrist and rehab team continue to assess barriers to discharge/monitor patient progress  toward functional and medical goals. FIM: FIM - Bathing Bathing Steps Patient Completed: Chest;Right Arm;Left Arm;Abdomen;Buttocks;Front perineal area;Right upper leg;Left upper leg;Left lower leg (including foot);Right lower leg (including foot) (used long sponge) Bathing: 5: Supervision: Safety issues/verbal cues  FIM - Upper Body Dressing/Undressing Upper body dressing/undressing steps patient completed: Thread/unthread right sleeve of pullover shirt/dresss;Thread/unthread left sleeve of pullover shirt/dress;Put head through opening of pull over shirt/dress;Pull shirt over trunk Upper body dressing/undressing: 5: Set-up assist to: Obtain clothing/put away FIM - Lower Body Dressing/Undressing Lower body dressing/undressing steps patient completed: Thread/unthread right pants leg;Thread/unthread left pants leg;Don/Doff right sock;Pull pants up/down;Don/Doff left sock Lower body dressing/undressing: 3: Mod-Patient completed 50-74% of tasks  FIM - Toileting Toileting steps completed by patient: Performs perineal hygiene Toileting Assistive Devices: Grab bar or rail for support Toileting: 2: Max-Patient completed 1 of 3 steps  FIM - Radio producer Devices: Insurance account manager Transfers: 4-To toilet/BSC: Min A (steadying Pt. > 75%);4-From toilet/BSC: Min A (steadying Pt. > 75%)  FIM - Control and instrumentation engineer Devices: Walker;Arm rests Bed/Chair Transfer: 4: Chair or W/C > Bed: Min A (steadying Pt. > 75%)  FIM - Locomotion: Wheelchair Distance: 50 Locomotion: Wheelchair: 0: Activity did not occur FIM - Locomotion: Ambulation Locomotion: Ambulation Assistive Devices: Administrator Ambulation/Gait Assistance: 5: Supervision;4: Min guard Locomotion: Ambulation: 1: Travels less than 50 ft with minimal assistance (Pt.>75%)  Comprehension Comprehension Mode: Auditory Comprehension: 3-Understands basic 50 - 74% of the time/requires cueing 25 -  50%  of the time  Expression Expression Mode:  Verbal Expression: 3-Expresses basic 50 - 74% of the time/requires cueing 25 - 50% of the time. Needs to repeat parts of sentences.  Social Interaction Social Interaction: 4-Interacts appropriately 75 - 89% of the time - Needs redirection for appropriate language or to initiate interaction.  Problem Solving Problem Solving: 3-Solves basic 50 - 74% of the time/requires cueing 25 - 49% of the time  Memory Memory: 2-Recognizes or recalls 25 - 49% of the time/requires cueing 51 - 75% of the time  Medical Problem List and Plan:  1. Functional deficits secondary to right parietal lobe infarct as well as history of CVA May of 2015  2. DVT Prophylaxis/Anticoagulation: SCDs. Monitor for any signs of DVT  3. Pain Management: Tylenol as needed  4. Mood/dementia: Patient appears at baseline  5. Neuropsych: This patient is not capable of making decisions on her own behalf.  6. Skin/Wound Care: Routine skin checks  7. GI bleed/AV malformation. Followup gastroscopy services. Monitor CBC. Continue Protonix daily  8. Diabetes mellitus with peripheral neuropathy. Hemoglobin A1c 7.0. Lantus insulin 8 units qhs   -more elevated in evening---added am lantus 5u---titrate further as needed 9. Hypertension. Toprol-XL 25 mg daily, lisinopril 5 mg daily, Imdur 60 mg daily.   -may need further titration if elevation is persistent (generally better) 10. Hyperlipidemia. Lipitor  11. Chronic renal insufficiency. Baseline creatinine 9.21.   12. Diastolic congestive heart failure. Monitor for any signs of fluid overload  13. Low grade temp  -IS  -E coli UTI---cipro for 7 days  LOS (Days) 4 A FACE TO FACE EVALUATION WAS PERFORMED  Abbee Cremeens T 05/19/2014 9:16 AM

## 2014-05-20 ENCOUNTER — Inpatient Hospital Stay (HOSPITAL_COMMUNITY): Payer: Medicare Other | Admitting: Occupational Therapy

## 2014-05-20 ENCOUNTER — Inpatient Hospital Stay (HOSPITAL_COMMUNITY): Payer: Self-pay | Admitting: Physical Therapy

## 2014-05-20 DIAGNOSIS — I6350 Cerebral infarction due to unspecified occlusion or stenosis of unspecified cerebral artery: Secondary | ICD-10-CM

## 2014-05-20 DIAGNOSIS — I509 Heart failure, unspecified: Secondary | ICD-10-CM

## 2014-05-20 DIAGNOSIS — I69993 Ataxia following unspecified cerebrovascular disease: Secondary | ICD-10-CM

## 2014-05-20 DIAGNOSIS — K5521 Angiodysplasia of colon with hemorrhage: Secondary | ICD-10-CM

## 2014-05-20 DIAGNOSIS — I5031 Acute diastolic (congestive) heart failure: Secondary | ICD-10-CM

## 2014-05-20 DIAGNOSIS — I4891 Unspecified atrial fibrillation: Secondary | ICD-10-CM

## 2014-05-20 DIAGNOSIS — I5032 Chronic diastolic (congestive) heart failure: Secondary | ICD-10-CM

## 2014-05-20 DIAGNOSIS — D509 Iron deficiency anemia, unspecified: Secondary | ICD-10-CM

## 2014-05-20 LAB — GLUCOSE, CAPILLARY
GLUCOSE-CAPILLARY: 86 mg/dL (ref 70–99)
GLUCOSE-CAPILLARY: 219 mg/dL — AB (ref 70–99)
Glucose-Capillary: 135 mg/dL — ABNORMAL HIGH (ref 70–99)
Glucose-Capillary: 151 mg/dL — ABNORMAL HIGH (ref 70–99)

## 2014-05-20 NOTE — Progress Notes (Signed)
[name] is a 80 y.o. female 10-Mar-1933 [id number]  Subjective: No new complaints. No new problems.  Feeling OK.  Objective: Vital signs in last 24 hours: Temp:  [97.3 F (36.3 C)-98 F (36.7 C)] 98 F (36.7 C) (08/08 0442) Pulse Rate:  [56-76] 70 (08/08 0442) Resp:  [18] 18 (08/08 0442) BP: (158-180)/(70-79) 180/70 mmHg (08/08 0442) SpO2:  [95 %-98 %] 97 % (08/08 0442) Weight change:  Last BM Date: 05/20/14  Intake/Output from previous day: 08/07 0701 - 08/08 0700 In: 840 [P.O.:840] Out: 2 [Urine:2] Last cbgs: CBG (last 3)   Recent Labs  05/19/14 1615 05/19/14 2103 05/20/14 0635  GLUCAP 242* 167* 86     Physical Exam General: No apparent distress   HEENT: not dry Lungs: Normal effort. Lungs clear to auscultation, no crackles or wheezes. Cardiovascular: Regular rate and rhythm, no edema Abdomen: S/NT/ND; BS(+) Musculoskeletal:  unchanged Neurological: No new neurological deficits Wounds: N/A    Skin: clear  Aging changes Mental state: Alert,  cooperative    Lab Results: BMET    Component Value Date/Time   NA 140 05/16/2014 0755   K 4.9 05/16/2014 0755   CL 108 05/16/2014 0755   CO2 18* 05/16/2014 0755   GLUCOSE 76 05/16/2014 0755   BUN 20 05/16/2014 0755   CREATININE 1.53* 05/16/2014 0755   CREATININE 2.72* 03/24/2014 1641   CALCIUM 8.5 05/16/2014 0755   GFRNONAA 31* 05/16/2014 0755   GFRNONAA 39* 09/03/2011 1404   GFRAA 36* 05/16/2014 0755   GFRAA 45* 09/03/2011 1404   CBC    Component Value Date/Time   WBC 12.9* 05/16/2014 1105   RBC 3.99 05/16/2014 1105   RBC 3.64* 05/15/2014 1200   HGB 10.5* 05/16/2014 1105   HCT 33.8* 05/16/2014 1105   PLT 316 05/16/2014 1105   MCV 84.7 05/16/2014 1105   MCH 26.3 05/16/2014 1105   MCHC 31.1 05/16/2014 1105   RDW 16.7* 05/16/2014 1105   LYMPHSABS 0.6* 05/16/2014 1105   MONOABS 0.8 05/16/2014 1105   EOSABS 0.2 05/16/2014 1105   BASOSABS 0.0 05/16/2014 1105    Studies/Results: No results found.  Medications: I have reviewed the  patient's current medications.  Assessment/Plan:  1. Functional deficits secondary to right parietal lobe infarct as well as history of CVA May of 2015  2. DVT Prophylaxis/Anticoagulation: SCDs. Monitor for any signs of DVT  3. Pain Management: Tylenol as needed  4. Mood/dementia: Patient appears at baseline  5. Neuropsych: This patient is not capable of making decisions on her own behalf.  6. Skin/Wound Care: Routine skin checks  7. GI bleed/AV malformation. Followup gastroscopy services. Monitor CBC. Continue Protonix daily  8. Diabetes mellitus with peripheral neuropathy. Hemoglobin A1c 7.0. Lantus insulin 8 units qhs  -more elevated in evening---added am lantus 5u---titrate further as needed  9. Hypertension. Toprol-XL 25 mg daily, lisinopril 5 mg daily, Imdur 60 mg daily.  -may need further titration if elevation is persistent (generally better)  10. Hyperlipidemia. Lipitor  11. Chronic renal insufficiency. Baseline creatinine 3.66.  12. Diastolic congestive heart failure. Monitor for any signs of fluid overload  13. Low grade temp  -IS  -E coli UTI---cipro for 7 days     Length of stay, days: 5  Walker Kehr , MD 05/20/2014, 9:26 AM

## 2014-05-20 NOTE — Progress Notes (Signed)
Occupational Therapy Session Note  Patient Details  Name: [name] MRN: [id number] Date of Birth: 1932-11-01  Today's Date: 05/20/2014 Time: 1000-1100 Time Calculation (min): 60 min  Skilled Therapeutic Interventions/Progress Updates: ADL in w/c at sink with focus on sit to stand and vestibular static and dynamic balance  during bathing and toileting (shook with all standing activities) and LE bathing and dressing.  Patient needs elastic laces and was notable to process using LH shoe horn to don shoes.   Patient with comprehensiion deficits - perhaps partially due to Dementia.  Patient requested to lay down at end of session.   Left with call bell and phone and bed alarm in place.     Therapy Documentation Precautions:  Precautions Precautions: Fall Restrictions Weight Bearing Restrictions: No  Pain: denied    See FIM for current functional status  Therapy/Group: Individual Therapy  Alfredia Ferguson [facility] 05/20/2014, 11:28 AM

## 2014-05-20 NOTE — Progress Notes (Addendum)
Physical Therapy Session Note  Patient Details  Name: [name] MRN: [id number] Date of Birth: 1933/06/28  Today's Date: 05/20/2014 Time: 5009-3818 2993-7169 Time Calculation (min): 60 min, 60 min  Short Term Goals: Week 1:  PT Short Term Goal 1 (Week 1): STG=LTG due to ELOS, S overall with RW  Skilled Therapeutic Interventions/Progress Updates:    Pt with noted decreased core activation in upright activities. Pt with some response noted to motor re-ed strategies for core control, but difficulty carrying over some techniques due to command following deficits. Pt does best with ace wrap for tonic core facilitation and simple phonation strategies. Some decreased foot drag noted with these tonic core facilitation strategies. Pt would continue to benefit from skilled PT services to increase functional mobility.  Therapy Documentation Precautions:  Precautions Precautions: Fall Restrictions Weight Bearing Restrictions: No Vital Signs: Therapy Vitals Pulse Rate: 78 BP: 164/69 mmHg (s/p gait) Mobility:  Pt with cues for weight shift, safety, and attention with transfers.  Locomotion : Ambulation Ambulation/Gait Assistance: 5: Supervision;4: Min guard with cues for weight shift, core control, attention, and safety 45'x3 in am session, 45'x1, 30'x1 in pm session Balance: Balance Balance Assessed: Yes Static Sitting Balance Static Sitting - Balance Support: Feet supported Static Sitting - Level of Assistance: 5: Stand by assistance Dynamic Sitting Balance Dynamic Sitting - Balance Support: No upper extremity supported;Feet supported Dynamic Sitting - Level of Assistance: 4: Min assist Dynamic Sitting - Balance Activities: Lateral lean/weight shifting;Reaching for objects;Reaching across midline;Trunk control activities Sitting balance - Comments: during functional tasks Static Standing Balance Static Standing - Balance Support: Bilateral upper extremity supported;During functional  activity Static Standing - Level of Assistance: 4: Min assist Static Standing - Comment/# of Minutes: x1' Dynamic Standing Balance Dynamic Standing - Balance Support: During functional activity;Bilateral upper extremity supported Dynamic Standing - Level of Assistance: 4: Min assist Dynamic Standing - Balance Activities: Lateral lean/weight shifting;Forward lean/weight shifting;Reaching for objects;Reaching across midline Dynamic Standing - Comments: 2'x3 Other Treatments:   Treatment 1:Anterior weight shifting 2x10, isometric hold of anterior weight shift 30"x2, anterior weight shift with marching and hip abd 2x10 each. Pt donned ace wrap for tonic core activation. Pt educated on safety. Pt performs dynamic standing and sitting balance tasks with dual motor tasks including weight shifting, reaching, grasping, and LE manipulation within a functional context. Pt educated on safety in mobility and rehab plan.  Treatment 2: Prone lying x5', prolonged phonation in sitting 8"x4, singing in standing 1'x3. Pt educated in safety in mobility and rehab plan. Anterior weight shifting with ventilatory cues 3x10. Sit to stands with focus on speaking during eccentric sit down for eccentric control.2x5. Pt performs marching 2x10 in standing. Pt performs standing with perturbations 2'x2. Tonic spread performed in sitting. Rehab plan reviewed with pt and family.   See FIM for current functional status  Therapy/Group: Individual Therapy  Monia Pouch 05/20/2014, 9:47 AM

## 2014-05-21 ENCOUNTER — Inpatient Hospital Stay (HOSPITAL_COMMUNITY): Payer: Self-pay | Admitting: Physical Therapy

## 2014-05-21 ENCOUNTER — Inpatient Hospital Stay (HOSPITAL_COMMUNITY): Payer: Medicare Other | Admitting: Occupational Therapy

## 2014-05-21 DIAGNOSIS — K2100 Gastro-esophageal reflux disease with esophagitis, without bleeding: Secondary | ICD-10-CM

## 2014-05-21 DIAGNOSIS — I2510 Atherosclerotic heart disease of native coronary artery without angina pectoris: Secondary | ICD-10-CM

## 2014-05-21 DIAGNOSIS — D508 Other iron deficiency anemias: Secondary | ICD-10-CM

## 2014-05-21 LAB — GLUCOSE, CAPILLARY
GLUCOSE-CAPILLARY: 106 mg/dL — AB (ref 70–99)
GLUCOSE-CAPILLARY: 177 mg/dL — AB (ref 70–99)
Glucose-Capillary: 232 mg/dL — ABNORMAL HIGH (ref 70–99)
Glucose-Capillary: 181 mg/dL — ABNORMAL HIGH (ref 70–99)
Glucose-Capillary: 174 mg/dL — ABNORMAL HIGH (ref 70–99)

## 2014-05-21 NOTE — Progress Notes (Addendum)
Physical Therapy Session Note  Patient Details  Name: [name] MRN: [id number] Date of Birth: 09/29/1933  Today's Date: 05/21/2014 Time: 1829-9371 Time Calculation (min): 40 min  Short Term Goals: Week 1:  PT Short Term Goal 1 (Week 1): STG=LTG due to ELOS, S overall with RW  Skilled Therapeutic Interventions/Progress Updates:     Pt received supine in bed with nursing present.  Pt performed stand pivot transfers bed <> wheelchair with min assist and mod vcs and visual demo to improve hand and foot placement and sequencing. Pt stand pivot transfer wheelchair to nu-step and performed x 5 mins with assistance from therapist to keep feet in correct position.  Therapist instructed seated B LE strengthening for marching, ankle pumps and LAQs x 15 reps each and standing in RW for marching, heel raises and mini squats x 15 reps each with pt displaying moderate fatigue. Gait training with RW with therapist assist to keep walker on straight path and min to mod assist due to fatigue x 130'. Pt returned to bed with bed alarm engaged and all needs within reach.       Therapy Documentation Precautions:  Precautions Precautions: Fall Restrictions Weight Bearing Restrictions: No      Pain: Pt denies pain       Ambulation Ambulation/Gait Assistance: 4: Min assist;3: Mod assist (increased assistance for turns and changing direction)             See FIM for current functional status  Therapy/Group: Individual Therapy  Jeanette Caprice 05/21/2014, 11:52 AM

## 2014-05-21 NOTE — Progress Notes (Signed)
[name] is a 80 y.o. female Jul 02, 1933 [id number]  Subjective: No new complaints. No new problems.  Feeling OK. Slept well  Objective: Vital signs in last 24 hours: Temp:  [98 F (36.7 C)-98.6 F (37 C)] 98 F (36.7 C) (08/09 0540) Pulse Rate:  [67-78] 78 (08/09 0540) Resp:  [17-18] 17 (08/09 0540) BP: (153-164)/(66-79) 153/79 mmHg (08/09 0540) SpO2:  [94 %-100 %] 100 % (08/09 0540) Weight change:  Last BM Date: 05/20/14  Intake/Output from previous day: 08/08 0701 - 08/09 0700 In: 600 [P.O.:600] Out: -  Last cbgs: CBG (last 3)   Recent Labs  05/20/14 1659 05/20/14 2035 05/21/14 0630  GLUCAP 151* 219* 106*     Physical Exam General: No apparent distress   HEENT: not dry Lungs: Normal effort. Lungs clear to auscultation, no crackles or wheezes. Cardiovascular: Regular rate and rhythm, no edema Abdomen: S/NT/ND; BS(+) Musculoskeletal:  unchanged Neurological: No new neurological deficits Wounds: N/A    Skin: clear  Aging changes Mental state: Alert,  cooperative    Lab Results: BMET    Component Value Date/Time   NA 140 05/16/2014 0755   K 4.9 05/16/2014 0755   CL 108 05/16/2014 0755   CO2 18* 05/16/2014 0755   GLUCOSE 76 05/16/2014 0755   BUN 20 05/16/2014 0755   CREATININE 1.53* 05/16/2014 0755   CREATININE 2.72* 03/24/2014 1641   CALCIUM 8.5 05/16/2014 0755   GFRNONAA 31* 05/16/2014 0755   GFRNONAA 39* 09/03/2011 1404   GFRAA 36* 05/16/2014 0755   GFRAA 45* 09/03/2011 1404   CBC    Component Value Date/Time   WBC 12.9* 05/16/2014 1105   RBC 3.99 05/16/2014 1105   RBC 3.64* 05/15/2014 1200   HGB 10.5* 05/16/2014 1105   HCT 33.8* 05/16/2014 1105   PLT 316 05/16/2014 1105   MCV 84.7 05/16/2014 1105   MCH 26.3 05/16/2014 1105   MCHC 31.1 05/16/2014 1105   RDW 16.7* 05/16/2014 1105   LYMPHSABS 0.6* 05/16/2014 1105   MONOABS 0.8 05/16/2014 1105   EOSABS 0.2 05/16/2014 1105   BASOSABS 0.0 05/16/2014 1105    Studies/Results: No results found.  Medications: I have reviewed the  patient's current medications.  Assessment/Plan:  1. Functional deficits secondary to right parietal lobe infarct as well as history of CVA May of 2015  2. DVT Prophylaxis/Anticoagulation: SCDs. Monitor for any signs of DVT  3. Pain Management: Tylenol as needed  4. Mood/dementia: Patient appears at baseline  5. Neuropsych: This patient is not capable of making decisions on her own behalf.  6. Skin/Wound Care: Routine skin checks  7. GI bleed/AV malformation. Followup gastroscopy services. Monitor CBC. Continue Protonix daily  8. Diabetes mellitus with peripheral neuropathy. Hemoglobin A1c 7.0. Lantus insulin 8 units qhs  -more elevated in evening---added am lantus 5u---titrate further as needed  9. Hypertension. Toprol-XL 25 mg daily, lisinopril 5 mg daily, Imdur 60 mg daily.  -may need further titration if elevation is persistent (generally better)  10. Hyperlipidemia. Lipitor  11. Chronic renal insufficiency. Baseline creatinine 7.00.  12. Diastolic congestive heart failure. Cont to monitor for any signs of fluid overload  13. Low grade temp  -IS  -E coli UTI---cipro for 7 days - cont Rx     Length of stay, days: 6  Walker Kehr , MD 05/21/2014, 8:25 AM

## 2014-05-22 ENCOUNTER — Inpatient Hospital Stay (HOSPITAL_COMMUNITY): Payer: Self-pay | Admitting: Speech Pathology

## 2014-05-22 ENCOUNTER — Inpatient Hospital Stay (HOSPITAL_COMMUNITY): Payer: Medicare Other | Admitting: *Deleted

## 2014-05-22 ENCOUNTER — Encounter (HOSPITAL_COMMUNITY): Payer: Self-pay | Admitting: Occupational Therapy

## 2014-05-22 DIAGNOSIS — I5032 Chronic diastolic (congestive) heart failure: Secondary | ICD-10-CM

## 2014-05-22 DIAGNOSIS — E1165 Type 2 diabetes mellitus with hyperglycemia: Secondary | ICD-10-CM

## 2014-05-22 DIAGNOSIS — I2510 Atherosclerotic heart disease of native coronary artery without angina pectoris: Secondary | ICD-10-CM

## 2014-05-22 DIAGNOSIS — I6330 Cerebral infarction due to thrombosis of unspecified cerebral artery: Secondary | ICD-10-CM

## 2014-05-22 DIAGNOSIS — N189 Chronic kidney disease, unspecified: Secondary | ICD-10-CM

## 2014-05-22 DIAGNOSIS — E1149 Type 2 diabetes mellitus with other diabetic neurological complication: Secondary | ICD-10-CM

## 2014-05-22 DIAGNOSIS — I69959 Hemiplegia and hemiparesis following unspecified cerebrovascular disease affecting unspecified side: Secondary | ICD-10-CM

## 2014-05-22 DIAGNOSIS — I509 Heart failure, unspecified: Secondary | ICD-10-CM

## 2014-05-22 DIAGNOSIS — I10 Essential (primary) hypertension: Secondary | ICD-10-CM

## 2014-05-22 DIAGNOSIS — IMO0001 Reserved for inherently not codable concepts without codable children: Secondary | ICD-10-CM

## 2014-05-22 DIAGNOSIS — Z5189 Encounter for other specified aftercare: Secondary | ICD-10-CM

## 2014-05-22 LAB — BASIC METABOLIC PANEL
Anion gap: 13 (ref 5–15)
BUN: 29 mg/dL — ABNORMAL HIGH (ref 6–23)
CALCIUM: 8.7 mg/dL (ref 8.4–10.5)
CO2: 21 mEq/L (ref 19–32)
CREATININE: 1.66 mg/dL — AB (ref 0.50–1.10)
Chloride: 106 mEq/L (ref 96–112)
GFR calc non Af Amer: 28 mL/min — ABNORMAL LOW (ref >90)
GFR, EST AFRICAN AMERICAN: 33 mL/min — AB (ref >90)
Glucose, Bld: 164 mg/dL — ABNORMAL HIGH (ref 70–99)
Potassium: 4.5 mEq/L (ref 3.7–5.3)
Sodium: 140 mEq/L (ref 137–147)

## 2014-05-22 LAB — GLUCOSE, CAPILLARY
GLUCOSE-CAPILLARY: 219 mg/dL — AB (ref 70–99)
Glucose-Capillary: 161 mg/dL — ABNORMAL HIGH (ref 70–99)
Glucose-Capillary: 167 mg/dL — ABNORMAL HIGH (ref 70–99)
Glucose-Capillary: 289 mg/dL — ABNORMAL HIGH (ref 70–99)

## 2014-05-22 LAB — CBC
HCT: 26.5 % — ABNORMAL LOW (ref 36.0–46.0)
Hemoglobin: 8.2 g/dL — ABNORMAL LOW (ref 12.0–15.0)
MCH: 25.6 pg — AB (ref 26.0–34.0)
MCHC: 30.9 g/dL (ref 30.0–36.0)
MCV: 82.8 fL (ref 78.0–100.0)
Platelets: 303 10*3/uL (ref 150–400)
RBC: 3.2 MIL/uL — ABNORMAL LOW (ref 3.87–5.11)
RDW: 16.9 % — AB (ref 11.5–15.5)
WBC: 7.8 10*3/uL (ref 4.0–10.5)

## 2014-05-22 NOTE — Progress Notes (Signed)
Occupational Therapy Session Note  Patient Details  Name: [name] MRN: [id number] Date of Birth: 04-Sep-1933  Today's Date: 05/22/2014 Time: 0800-0900 Time Calculation (min): 60 min  Short Term Goals: Week 1:  OT Short Term Goal 1 (Week 1): Pt will be able to ambulate in and out of the bathroom with min A with RW. OT Short Term Goal 2 (Week 1): Pt will be able to wash her feet using a long handled sponge. OT Short Term Goal 3 (Week 1): Pt will be able to don her socks and shoes with supervision.  Skilled Therapeutic Interventions/Progress Updates:  Patient in w/c and finishing her breakfast with RN in room.  Engaged in self care retraining to include sponge bath (declined shower today), dress and groom tasks.  Focused session on activity tolerance, sit><stands, static and dynamic standing balance, standing tolerance, visual attention to left, forced use of LUE.  Patient attempted to tie both of her shoes however without success.  Given extra time, patient able to perform more of her bathing and dressing today with less cues.  Therapy Documentation Precautions:  Precautions Precautions: Fall Restrictions Weight Bearing Restrictions: No Pain: Denies pain ADL: See FIM for current functional status  Therapy/Group: Individual Therapy  Aleksis Jiggetts 05/22/2014, 10:57 AM

## 2014-05-22 NOTE — Progress Notes (Signed)
Speech Language Pathology Daily Session Note  Patient Details  Name: [name] MRN: [id number] Date of Birth: 03-18-1933  Today's Date: 05/22/2014 Time: 1035-1105 Time Calculation (min): 30 min  Short Term Goals: Week 1: SLP Short Term Goal 1 (Week 1): Pt will sustain attention to functional tasks for 5-7 mintues with min cuing for redirection.  SLP Short Term Goal 2 (Week 1): Pt will improve basic problem solving during functional tasks for 80% accuracy with mod assist  SLP Short Term Goal 3 (Week 1): Pt will use external aids to facilitate improved recall of daily events and information for 80% accuracy with mod assist.   Skilled Therapeutic Interventions:  Pt was seen for skilled speech therapy targeting cognitive goals.  Upon arrival, pt was upright in wheelchair with daughter, Fraser Din, present.  SLP engaged pt in a basic card game targeting sustained attention and basic problem solving with pt requiring overall max assist to complete task.  Pt benefited frequent cues for redirection to both structured and unstructured tasks in a minimally distracting environment secondary to delayed processing speed.  Furthermore, pt required overall mod-max assist to answer personally relevant questions to make needs/wants known.  Continue per current plan of care.    FIM:  Comprehension Comprehension Mode: Auditory Comprehension: 3-Understands basic 50 - 74% of the time/requires cueing 25 - 50%  of the time Expression Expression Mode: Verbal Expression: 2-Expresses basic 25 - 49% of the time/requires cueing 50 - 75% of the time. Uses single words/gestures. Social Interaction Social Interaction: 4-Interacts appropriately 75 - 89% of the time - Needs redirection for appropriate language or to initiate interaction. Problem Solving Problem Solving: 2-Solves basic 25 - 49% of the time - needs direction more than half the time to initiate, plan or complete simple activities Memory Memory: 1-Recognizes  or recalls less than 25% of the time/requires cueing greater than 75% of the time  Pain Pain Assessment Pain Assessment: No/denies pain  Therapy/Group: Individual Therapy  Windell Moulding, M.A. CCC-SLP  Tija Biss, Selinda Orion 05/22/2014, 4:49 PM

## 2014-05-22 NOTE — Progress Notes (Signed)
[place] PHYSICAL MEDICINE & REHABILITATION     PROGRESS NOTE    Subjective/Complaints: Neglecting left side, needs freq cues to use left hand to propel WC ROS limited due to language/cognition  Objective: Vital Signs: Blood pressure 179/74, pulse 72, temperature 98.7 F (37.1 C), temperature source Oral, resp. rate 18, height 5' 2.5" (1.588 m), weight 70.398 kg (155 lb 3.2 oz), last menstrual period 12/19/1968, SpO2 98.00%. No results found.  Recent Labs  05/22/14 0557  WBC 7.8  HGB 8.2*  HCT 26.5*  PLT 303    Recent Labs  05/22/14 0557  NA 140  K 4.5  CL 106  GLUCOSE 164*  BUN 29*  CREATININE 1.66*  CALCIUM 8.7   CBG (last 3)   Recent Labs  05/21/14 2042 05/21/14 2212 05/22/14 0650  GLUCAP 181* 174* 161*    Wt Readings from Last 3 Encounters:  05/18/14 70.398 kg (155 lb 3.2 oz)  05/10/14 70.7 kg (155 lb 13.8 oz)  05/10/14 70.7 kg (155 lb 13.8 oz)    Physical Exam:  Gen: a little slow to arouse HENT: oral mucosa moist  Head: Normocephalic.  Eyes: EOM are normal.  Neck: Normal range of motion. Neck supple. No thyromegaly present.  Cardiovascular:  Cardiac rate controlled  Respiratory: Effort normal and breath sounds normal. No respiratory distress.  GI: Soft. Bowel sounds are normal. She exhibits no distension.  Neurological: She is alert.  Expressive language deficits a little better. Follows some simple  commands . Limitations in left sided motor vs right but Manual muscle testing: 3-4/5 LUE and LLE.  Senses pain in all 4's.   Skin: Skin is warm and dry  Psych: flat,  cooperative   Assessment/Plan: 1. Functional deficits secondary to right parietal lobe infarct which require 3+ hours per day of interdisciplinary therapy in a comprehensive inpatient rehab setting. Physiatrist is providing close team supervision and 24 hour management of active medical problems listed below. Physiatrist and rehab team continue to assess barriers to  discharge/monitor patient progress toward functional and medical goals. FIM: FIM - Bathing Bathing Steps Patient Completed: Chest;Right Arm;Left Arm;Abdomen;Front perineal area;Buttocks;Right upper leg;Left upper leg Bathing: 4: Min-Patient completes 8-9 28f 10 parts or 75+ percent  FIM - Upper Body Dressing/Undressing Upper body dressing/undressing steps patient completed: Thread/unthread right sleeve of pullover shirt/dresss;Put head through opening of pull over shirt/dress;Pull shirt over trunk Upper body dressing/undressing: 4: Min-Patient completed 75 plus % of tasks FIM - Lower Body Dressing/Undressing Lower body dressing/undressing steps patient completed: Thread/unthread right pants leg;Pull pants up/down Lower body dressing/undressing: 2: Max-Patient completed 25-49% of tasks  FIM - Toileting Toileting steps completed by patient: Adjust clothing prior to toileting;Performs perineal hygiene;Adjust clothing after toileting Toileting Assistive Devices: Grab bar or rail for support Toileting: 5: Supervision: Safety issues/verbal cues  FIM - Radio producer Devices: Mining engineer Transfers: 5-To toilet/BSC: Supervision (verbal cues/safety issues);5-From toilet/BSC: Supervision (verbal cues/safety issues)  FIM - Control and instrumentation engineer Devices: Walker;Arm rests Bed/Chair Transfer: 4: Bed > Chair or W/C: Min A (steadying Pt. > 75%)  FIM - Locomotion: Wheelchair Distance: 50 Locomotion: Wheelchair: 0: Activity did not occur FIM - Locomotion: Ambulation Locomotion: Ambulation Assistive Devices: Administrator Ambulation/Gait Assistance: 4: Min assist;3: Mod assist (increased assistance for turns and changing direction) Locomotion: Ambulation: 2: Travels 50 - 149 ft with minimal assistance (Pt.>75%)  Comprehension Comprehension Mode: Auditory Comprehension: 3-Understands basic 50 - 74% of the time/requires cueing 25 -  50%  of  the time  Expression Expression Mode: Verbal Expression: 2-Expresses basic 25 - 49% of the time/requires cueing 50 - 75% of the time. Uses single words/gestures.  Social Interaction Social Interaction: 4-Interacts appropriately 75 - 89% of the time - Needs redirection for appropriate language or to initiate interaction.  Problem Solving Problem Solving: 1-Solves basic less than 25% of the time - needs direction nearly all the time or does not effectively solve problems and may need a restraint for safety  Memory Memory: 1-Recognizes or recalls less than 25% of the time/requires cueing greater than 75% of the time  Medical Problem List and Plan:  1. Functional deficits secondary to right parietal lobe infarct as well as history of CVA May of 2015  2. DVT Prophylaxis/Anticoagulation: SCDs. Monitor for any signs of DVT  3. Pain Management: Tylenol as needed  4. Mood/dementia: Patient appears at baseline  5. Neuropsych: This patient is not capable of making decisions on her own behalf.  6. Skin/Wound Care: Routine skin checks  7. GI bleed/AV malformation. Followup gastroscopy services. Monitor CBC. Continue Protonix daily  8. Diabetes mellitus with peripheral neuropathy. Hemoglobin A1c 7.0. Lantus insulin 8 units qhs   -more elevated in evening---added am lantus 5u---titrate further as needed 9. Hypertension. Toprol-XL 25 mg daily, lisinopril 5 mg daily, Imdur 60 mg daily.   -may need further titration if elevation is persistent (generally better) 10. Hyperlipidemia. Lipitor  11. Chronic renal insufficiency. Baseline creatinine 7.06.   12. Diastolic congestive heart failure. Monitor for any signs of fluid overload  13. Low grade temp  -IS  -E coli UTI---cipro for 7 days  LOS (Days) 7 A FACE TO FACE EVALUATION WAS PERFORMED  [name] E 05/22/2014 9:11 AM

## 2014-05-22 NOTE — Progress Notes (Signed)
Physical Therapy Weekly Progress Note  Patient Details  Name: [name] MRN: [id number] Date of Birth: 04-Nov-1932  Beginning of progress report period: May 15, 2014 End of progress report period: May 22, 2014  Today's Date: 05/22/2014 Time: 9:00-10:00 (72min) and 14:00-14:30 (4min)    Patient has met 4 of 7 long term goals.  Short term goals not set due to estimated length of stay.  Pt making good progress with strength, activity tolerance, cognition, and motor planning/control. She is able to follow simple commands at this time and has progressed to walking 46' with RW and min-guard A. Her son is engaged in family education/training and is feeling good about her progress towards DC home this week.   Patient continues to demonstrate the following deficits: cognition, strength and activity tolerance, L-sided attention and therefore will continue to benefit from skilled PT intervention to enhance overall performance with activity tolerance, balance, attention, awareness and coordination.  See Patient's Care Plan for progression toward long term goals.  Patient progressing toward long term goals..  Plan of care revisions: Ambulation goal downgraded for distance due to lack lack of progress towards this goal. Family has transport [place] for longer distances needed. .  Skilled Therapeutic Interventions/Progress Updates: First tx focused on gait and curb step training, family training with son, and fall prevention/balance with [place] instruction. Provided NMR via forced use, manual facilitation, and multi-modal cues for L-sided attention and balance strategies. Pt continues to demonstrate tonic trunk/LE patterns, but has no LE buckling or overt LOB. Pt continues to need safety cues but is improving command following.   Pt challenged to propel WC for increased L-sided attention and strengthening short distance with cues for L grip and technique. Son will bring in pt's [facility] to assess if brakes can  be adjusted to fit in doors.  Gait training x92' and x50' with RW and cues for positioning in RW and posture as pt tends to drift L. Distance limited by fatigue.   Instructed son/pt in curb step x2 with RW and min A. Son able to return demo with cues for positioning downhill and to keep hands at ready on pt.   Instructed pt in [place] [facility] for strength and balance for fall risk reduction. Son engaged as well to cue pt. Pt needed 2 seated rests due to fatigue and cues for form.   Second tx focused on functional mobility, gait, and curb step training for daughter, Fraser Din.  Pt up in [facility], needing to use restroom. Pt continues to need safety cues for hand placement, RW management, and transfers to safely sit, yet requires little actual physical assist.   Performed gait in hall to target x100' with RW and min A for steadying.  Instructed daughter in curb step x2 with RW and min A. Daughter able to return demo with cues for positioning downhill and to keep hands on pt.   Daughter reports feeling comfortable with DC Thursday and progress towards goals.  Pt left up in [facility] with lap belt and all needs in reach.         Therapy Documentation Precautions:  Precautions Precautions: Fall Restrictions Weight Bearing Restrictions: No General:   Vital Signs: Therapy Vitals Temp: 98.7 F (37.1 C) Temp src: Oral Pulse Rate: 72 Resp: 18 BP: 179/74 mmHg Patient Position (if appropriate): Lying Oxygen Therapy SpO2: 98 % O2 Device: None (Room air) Pain: none   See FIM for current functional status  Therapy/Group: Individual Therapy  Kennieth Rad, PT, DPT  05/22/2014, 9:39 AM

## 2014-05-22 NOTE — Progress Notes (Signed)
NUTRITION FOLLOW-UP  INTERVENTION: Continue Glucerna Shake po TID, each supplement provides 220 kcal and 10 grams of protein  NUTRITION DIAGNOSIS: Inadequate oral intake related to SOB and pain as evidenced by reported intake less than estimated needs; improving  Goal: Pt to meet >/= 90% of their estimated nutrition needs; met  Monitor:  Weight trends, po intake, labs  80 y.o. female  Admitting Dx: CVA  ASSESSMENT: 80 y.o. right-handed female with history of CAD with recent NSTEMI, diabetes mellitus and peripheral neuropathy, diastolic congestive heart failure, chronic renal insufficiency with creatinine 2.98, GI bleed and early dementia.   -8/4-Pt reports that she has been eating well. Meal completion is varied from 25-100%. She reports no recent weight loss. Pt had questions regarding a healthy body weight. She was open to trying Glucerna Shakes. Pt was unsure of her usual body weight.  - Pt with no signs of significant fat and/or muscle wasting  -8/10-Pt reports having a good appetite. Meal completion has varied from 50-100%. Pt reports she mostly has been eating a little bit more than half, but she is improving on her PO intake. Pt denies any nausea, stomach pains, or difficulties eating or swallowing. Spoke with RN, pt has been liking her Glucerna shakes and has been drinking them.   Labs: High glucose (164 mg/dL), BUN, and creatinine. Low GFR.  Height: Ht Readings from Last 1 Encounters:  05/15/14 5' 2.5" (1.588 m)    Weight: Wt Readings from Last 1 Encounters:  05/18/14 155 lb 3.2 oz (70.398 kg)    Wt Readings from Last 10 Encounters:  05/18/14 155 lb 3.2 oz (70.398 kg)  05/10/14 155 lb 13.8 oz (70.7 kg)  05/10/14 155 lb 13.8 oz (70.7 kg)  05/10/14 155 lb 13.8 oz (70.7 kg)  05/09/14 154 lb (69.854 kg)  04/25/14 154 lb (69.854 kg)  04/06/14 157 lb 1.6 oz (71.26 kg)  03/24/14 153 lb (69.4 kg)  03/23/14 160 lb (72.576 kg)  03/16/14 159 lb (72.122 kg)    BMI:   Body mass index is 27.92 kg/(m^2).  Estimated Nutritional Needs: Kcal: 1750-1950 Protein: 80-90 g Fluid: 1.8-2.0 L/day  Skin: non-pitting LE edema  Diet Order: Carb Control   Intake/Output Summary (Last 24 hours) at 05/22/14 1742 Last data filed at 05/22/14 1444  Gross per 24 hour  Intake    720 ml  Output      0 ml  Net    720 ml    Last BM: 8/9  Labs:   Recent Labs Lab 05/16/14 0755 05/22/14 0557  NA 140 140  K 4.9 4.5  CL 108 106  CO2 18* 21  BUN 20 29*  CREATININE 1.53* 1.66*  CALCIUM 8.5 8.7  GLUCOSE 76 164*    CBG (last 3)   Recent Labs  05/22/14 0650 05/22/14 1138 05/22/14 1623  GLUCAP 161* 167* 289*    Scheduled Meds: . aspirin  81 mg Oral Daily  . atorvastatin  40 mg Oral q1800  . ciprofloxacin  250 mg Oral Q24H  . feeding supplement (GLUCERNA SHAKE)  237 mL Oral TID BM  . insulin aspart  0-15 Units Subcutaneous TID WC & HS  . insulin glargine  5 Units Subcutaneous Daily  . insulin glargine  8 Units Subcutaneous QHS  . isosorbide mononitrate  60 mg Oral Daily  . lisinopril  5 mg Oral Daily  . magnesium oxide  400 mg Oral BID  . metoprolol succinate  25 mg Oral Daily  . pantoprazole  40 mg Oral QAC breakfast    Continuous Infusions:   Past Medical History  Diagnosis Date  . Asthma   . CAD (coronary artery disease)     Pt reports MI in 2006 (no documentation).  Cardiolite in 05/2002 and 07/2006 did not reveal any reversible ischemia.  Pt follows with Dr. Rex Kras at [facility].  . CHF (congestive heart failure)     EF 25-30% with dilated LV, mild LVH, severe hypokinesis, and mod-severe reduction in RV function  . Osteoporosis   . HYPERTENSION 08/03/2006  . GASTROPARESIS, DIABETIC 08/03/2006  . HYPERLIPIDEMIA 08/03/2006  . OBSTRUCTIVE SLEEP APNEA 01/06/2008  . PERIPHERAL NEUROPATHY 08/03/2006  . GERD 08/03/2006  . LOW BACK PAIN, CHRONIC 08/03/2006  . OSTEOPOROSIS 03/21/2009  . CEREBRAL EMBOLISM, WITH INFARCTION 07/02/2010  . Angina   .  Myocardial infarction "2 or 3"  . Pneumonia 02/26/12    "a few times; probably even today"  . Shortness of breath     "all the time"  . DIABETES MELLITUS, TYPE II 11/04/1983  . Blood transfusion 08/2011  . Lower GI bleeding 08/2011 and 01/2014  . Chronic daily headache   . Migraines   . Stroke summer 2011    "made my left hip worse"  . Uterine cancer   . Nonischemic cardiomyopathy 05/2010    Left heart catheterization:2011. Nonobstructive coronary artery disease.  . Pulmonary hypertension   . Hypertension   . NSTEMI (non-ST elevated myocardial infarction) 01/2014    type II with CVA  . Acute embolic stroke 7/90/2409  . Non-ST elevation MI (NSTEMI) 02/04/2014  . E. coli UTI 03/02/2014  . Renal failure, acute 02/04/2014  . Iron deficiency anemia secondary to blood loss (chronic) 01/06/2008    Qualifier: Diagnosis of  By: Tomasa Hosteller MD, Veronique D.   . Nonsustained ventricular tachycardia   . Angiodysplasia of intestine with hemorrhage 05/13/2014    Past Surgical History  Procedure Laterality Date  . Esophagogastroduodenoscopy  08/26/2011    Procedure: ESOPHAGOGASTRODUODENOSCOPY (EGD);  Surgeon: [name], MD;  [facility]: [facility] ENDOSCOPY;  Service: Endoscopy;  Laterality: N/A;  . Colonoscopy  08/28/2011    Procedure: COLONOSCOPY;  Surgeon: [name], MD;  Location: [facility];  Service: Endoscopy;  Laterality: N/A;  . Vaginal hysterectomy    . Tubal ligation    . Cataract extraction w/ intraocular lens  implant, bilateral    . Toe surgery      "right big toe; operated on it to straighten it out; it was under"  . Polysomnogram  10/17/2005    AHI-7.28/hr. AHI REM-20.8/hr. Average oxygen saturation range during REM and NREM was 97%. Lowest oxygen saturation during REM sleep was 90%.  . Carotid duplex  05/28/2010    No significant extracranial carotid artery stenosis demonstrated. Vertebrals are patent w/ antegrade flow.  . Cardiac catheterization  05/24/2010    No intervention -  recommed medical therapy.  . Cardiovascular stress test  08/07/2006    Moderate-severe defect seen in Basal inferior, Mid inferoseptal, Mid inferior, Mid inferolateral, and Apical inferior regions - consistent w/ infarct/scar. No scintigraphic evidence of inducible myocardial ischemia.  . Transthoracic echocardiogram  08/29/2011    EF 55-60%, moderate LVH,   . Esophagogastroduodenoscopy N/A 02/08/2014    Procedure: ESOPHAGOGASTRODUODENOSCOPY (EGD);  Surgeon: [name], MD;  Location: [facility] ENDOSCOPY;  Service: Endoscopy;  Laterality: N/A;  . Colonoscopy N/A 05/12/2014    Procedure: COLONOSCOPY;  Surgeon: [name], MD;  Location: [facility];  Service: Endoscopy;  Laterality:  N/A;  . Enteroscopy N/A 05/13/2014    Procedure: ENTEROSCOPY;  Surgeon: [name], MD;  Location: [place] [facility];  Service: Endoscopy;  Laterality: N/A;   Kallie Locks, MS, Provisional LDN Pager # [phone] After hours/ weekend pager # [phone]

## 2014-05-23 ENCOUNTER — Inpatient Hospital Stay (HOSPITAL_COMMUNITY): Payer: Self-pay | Admitting: Occupational Therapy

## 2014-05-23 ENCOUNTER — Encounter (HOSPITAL_COMMUNITY): Payer: Self-pay

## 2014-05-23 ENCOUNTER — Inpatient Hospital Stay (HOSPITAL_COMMUNITY): Payer: Self-pay | Admitting: Speech Pathology

## 2014-05-23 ENCOUNTER — Inpatient Hospital Stay (HOSPITAL_COMMUNITY): Payer: Self-pay

## 2014-05-23 ENCOUNTER — Inpatient Hospital Stay (HOSPITAL_COMMUNITY): Payer: Medicare Other | Admitting: Physical Therapy

## 2014-05-23 DIAGNOSIS — Z5189 Encounter for other specified aftercare: Secondary | ICD-10-CM

## 2014-05-23 DIAGNOSIS — E1165 Type 2 diabetes mellitus with hyperglycemia: Secondary | ICD-10-CM

## 2014-05-23 DIAGNOSIS — I6330 Cerebral infarction due to thrombosis of unspecified cerebral artery: Secondary | ICD-10-CM

## 2014-05-23 DIAGNOSIS — I10 Essential (primary) hypertension: Secondary | ICD-10-CM

## 2014-05-23 DIAGNOSIS — I5032 Chronic diastolic (congestive) heart failure: Secondary | ICD-10-CM

## 2014-05-23 DIAGNOSIS — IMO0001 Reserved for inherently not codable concepts without codable children: Secondary | ICD-10-CM

## 2014-05-23 LAB — GLUCOSE, CAPILLARY
GLUCOSE-CAPILLARY: 236 mg/dL — AB (ref 70–99)
Glucose-Capillary: 168 mg/dL — ABNORMAL HIGH (ref 70–99)
Glucose-Capillary: 212 mg/dL — ABNORMAL HIGH (ref 70–99)
Glucose-Capillary: 269 mg/dL — ABNORMAL HIGH (ref 70–99)

## 2014-05-23 MED ORDER — METOPROLOL SUCCINATE 12.5 MG HALF TABLET
12.5000 mg | ORAL_TABLET | Freq: Every day | ORAL | Status: DC
  Administered 2014-05-24 – 2014-05-25 (×2): 12.5 mg via ORAL
  Filled 2014-05-23 (×3): qty 1

## 2014-05-23 MED ORDER — AMLODIPINE BESYLATE 5 MG PO TABS
5.0000 mg | ORAL_TABLET | Freq: Every day | ORAL | Status: DC
  Administered 2014-05-23 – 2014-05-25 (×3): 5 mg via ORAL
  Filled 2014-05-23 (×4): qty 1

## 2014-05-23 NOTE — Progress Notes (Addendum)
Note/chart reviewed.  Katie Mahkayla Preece, RD, LDN Pager #: [phone] After-Hours Pager #: [phone]  

## 2014-05-23 NOTE — Progress Notes (Signed)
Inpatient Diabetes Program Recommendations  AACE/ADA: New Consensus Statement on Inpatient Glycemic Control (2013)  Target Ranges:  Prepandial:   less than 140 mg/dL      Peak postprandial:   less than 180 mg/dL (1-2 hours)      Critically ill patients:  140 - 180 mg/dL   Results for [name], [name] (MRN [id number]) as of 05/23/2014 11:29  Ref. Range 05/22/2014 06:50 05/22/2014 11:38 05/22/2014 16:23 05/22/2014 20:44 05/23/2014 06:57  Glucose-Capillary Latest Range: 70-99 mg/dL 161 (H) 167 (H) 289 (H) 219 (H) 168 (H)   Diabetes history: DM2 Outpatient Diabetes medications: Lantus 8 units QHS, Novolog 0-15 units QID Current orders for Inpatient glycemic control: Lantus 5 units daily, Novolog 8 units QHS, Novolog 0-15  Inpatient Diabetes Program Recommendations Insulin - Meal Coverage: If Glucerna is continued, please consider ordering Novolog 3 units TID with meals for meal coverage to help with elevated postprandial glucose. Diet: Please re-evaluate need for Glucerna TID between meals if patient is eating at least 50% of meals.  Thanks, Barnie Alderman, RN, MSN, CCRN Diabetes Coordinator Inpatient Diabetes Program [phone] (Team Pager) [phone] ([facility] office) [phone] [facility] office)

## 2014-05-23 NOTE — Progress Notes (Signed)
Patient's blood pressure 181/80, HR 56. Obtained manual reading  At 179/80 HR 56, made Dan Angiulli P.A. aware, patient is asymptomatic at this time. Will continue to monitor.  Oval Linsey, RN

## 2014-05-23 NOTE — Progress Notes (Signed)
Occupational Therapy Session Note  Patient Details  Name: [name] MRN: [id number] Date of Birth: January 31, 1933  Today's Date: 05/23/2014 Time: 1110-1155 Time Calculation (min): 45 min  Short Term Goals: Week 1:  OT Short Term Goal 1 (Week 1): Pt will be able to ambulate in and out of the bathroom with min A with RW. OT Short Term Goal 2 (Week 1): Pt will be able to wash her feet using a long handled sponge. OT Short Term Goal 3 (Week 1): Pt will be able to don her socks and shoes with supervision.  Skilled Therapeutic Interventions/Progress Updates:    Pt engaged in BADL retraining including bathing and dressing with sit<>stand from w/c at sink.  Pt declined shower this morning and requested bathing at sink.  Pt required min verbal cues to locate clothing items placed on left. Pt required assistance with orienting shirt and donning over head this morning.  Pt required assistance with donning shoes but was able to fasten laces of left shoe with extra time.  Focus on activity tolerance, sit<>stand, dynamic standing balance, attention to left, and safety awareness.  Therapy Documentation Precautions:  Precautions Precautions: Fall Restrictions Weight Bearing Restrictions: No Pain: Pain Assessment Pain Assessment: No/denies pain ADL: ADL ADL Comments: refer to FIM  See FIM for current functional status  Therapy/Group: Individual Therapy  Leroy Libman 05/23/2014, 12:04 PM

## 2014-05-23 NOTE — Progress Notes (Signed)
Physical Therapy Session Note  Patient Details  Name: [name] MRN: [id number] Date of Birth: 06-16-1933  Today's Date: 05/23/2014 Time: 0830-0930 Time Calculation (min): 60 min  Short Term Goals: Week 1:  PT Short Term Goal 1 (Week 1): STG=LTG due to ELOS, S overall with RW  Skilled Therapeutic Interventions/Progress Updates:   Pt received seated in w/c, son [name] present for session. Gait training x 130 ft, 50 ft, and 75 ft using RW, overall min guard. Pt negotiated up/down curb x 4 using RW with min A and appropriate cues for safety and technique provided by son. Pt performed car transfer using RW, max cues for safe technique and hand placement with min A from son. In ADL apartment, pt performed sit <> stand from furniture with RW and max cues for hand placement x 10, min A progressed to supervision. Pt performed seated and standing exercises with RW for UE support for BLE strengthening/fall prevention, completed each exercise to fatigue: LAQ, heel/toe raises, marching, knee flexion. Patient and son with no concerns regarding d/c 8/13. Pt returned to room and left sitting in w/c with QRB on and all needs within reach, son present.   Therapy Documentation Precautions:  Precautions Precautions: Fall Restrictions Weight Bearing Restrictions: No Vital Signs: Therapy Vitals Pulse Rate: 80 (lt radial) Resp: 18 BP: 169/68 mmHg Patient Position (if appropriate): Lying Pain: Pain Assessment Pain Assessment: No/denies pain  See FIM for current functional status  Therapy/Group: Individual Therapy  Laretta Alstrom 05/23/2014, 9:33 AM

## 2014-05-23 NOTE — Progress Notes (Addendum)
[place] PHYSICAL MEDICINE & REHABILITATION     PROGRESS NOTE    Subjective/Complaints: Sitting in wheelchair Trying to use calendar but misses words on Left side ROS limited due to language/cognition  Objective: Vital Signs: Blood pressure 169/68, pulse 80, temperature 97.9 F (36.6 C), temperature source Axillary, resp. rate 18, height 5' 2.5" (1.588 m), weight 70.398 kg (155 lb 3.2 oz), last menstrual period 12/19/1968, SpO2 95.00%. No results found.  Recent Labs  05/22/14 0557  WBC 7.8  HGB 8.2*  HCT 26.5*  PLT 303    Recent Labs  05/22/14 0557  NA 140  K 4.5  CL 106  GLUCOSE 164*  BUN 29*  CREATININE 1.66*  CALCIUM 8.7   CBG (last 3)   Recent Labs  05/22/14 1623 05/22/14 2044 05/23/14 0657  GLUCAP 289* 219* 168*    Wt Readings from Last 3 Encounters:  05/18/14 70.398 kg (155 lb 3.2 oz)  05/10/14 70.7 kg (155 lb 13.8 oz)  05/10/14 70.7 kg (155 lb 13.8 oz)    Physical Exam:  Gen: a little slow to arouse HENT: oral mucosa moist  Head: Normocephalic.  Eyes: EOM are normal.  Neck: Normal range of motion. Neck supple. No thyromegaly present.  Cardiovascular:  Cardiac rate controlled  Respiratory: Effort normal and breath sounds normal. No respiratory distress.  GI: Soft. Bowel sounds are normal. She exhibits no distension.  Neurological: She is alert.  Expressive language deficits a little better. Follows some simple  commands . Limitations in left sided motor vs right but Manual muscle testing: 3-4/5 LUE and LLE.  Senses pain in all 4's.   Skin: Skin is warm and dry  Psych: flat,  cooperative   Assessment/Plan: 1. Functional deficits secondary to right parietal lobe infarct which require 3+ hours per day of interdisciplinary therapy in a comprehensive inpatient rehab setting. Physiatrist is providing close team supervision and 24 hour management of active medical problems listed below. Physiatrist and rehab team continue to assess barriers to  discharge/monitor patient progress toward functional and medical goals. FIM: FIM - Bathing Bathing Steps Patient Completed: Chest;Right Arm;Left Arm;Abdomen;Front perineal area;Buttocks;Right upper leg;Left upper leg;Right lower leg (including foot);Left lower leg (including foot) Bathing: 5: Set-up assist to: Obtain items (use of LH sponge for increase independence with LB bath)  FIM - Upper Body Dressing/Undressing Upper body dressing/undressing steps patient completed: Thread/unthread right sleeve of pullover shirt/dresss;Put head through opening of pull over shirt/dress;Pull shirt over trunk Upper body dressing/undressing: 4: Min-Patient completed 75 plus % of tasks FIM - Lower Body Dressing/Undressing Lower body dressing/undressing steps patient completed: Thread/unthread right underwear leg;Thread/unthread left underwear leg;Pull underwear up/down;Thread/unthread right pants leg;Thread/unthread left pants leg;Pull pants up/down;Don/Doff right sock;Don/Doff left shoe Lower body dressing/undressing: 3: Mod-Patient completed 50-74% of tasks  FIM - Toileting Toileting steps completed by patient: Adjust clothing prior to toileting;Performs perineal hygiene;Adjust clothing after toileting Toileting Assistive Devices: Grab bar or rail for support Toileting: 5: Supervision: Safety issues/verbal cues  FIM - Radio producer Devices: Mining engineer Transfers: 4-To toilet/BSC: Min A (steadying Pt. > 75%);5-From toilet/BSC: Supervision (verbal cues/safety issues)  FIM - Control and instrumentation engineer Devices: Walker;Arm rests Bed/Chair Transfer: 5: Bed > Chair or W/C: Supervision (verbal cues/safety issues);4: Chair or W/C > Bed: Min A (steadying Pt. > 75%)  FIM - Locomotion: Wheelchair Distance: 30 Locomotion: Wheelchair: 1: Travels less than 50 ft with minimal assistance (Pt.>75%) FIM - Locomotion: Ambulation Locomotion: Ambulation  Assistive Devices: Administrator Ambulation/Gait  Assistance: 4: Min guard Locomotion: Ambulation: 2: Travels 50 - 149 ft with minimal assistance (Pt.>75%)  Comprehension Comprehension Mode: Auditory Comprehension: 3-Understands basic 50 - 74% of the time/requires cueing 25 - 50%  of the time  Expression Expression Mode: Verbal Expression: 2-Expresses basic 25 - 49% of the time/requires cueing 50 - 75% of the time. Uses single words/gestures.  Social Interaction Social Interaction: 4-Interacts appropriately 75 - 89% of the time - Needs redirection for appropriate language or to initiate interaction.  Problem Solving Problem Solving: 2-Solves basic 25 - 49% of the time - needs direction more than half the time to initiate, plan or complete simple activities  Memory Memory: 1-Recognizes or recalls less than 25% of the time/requires cueing greater than 75% of the time  Medical Problem List and Plan:  1. Functional deficits secondary to right parietal lobe infarct as well as history of CVA May of 2015  2. DVT Prophylaxis/Anticoagulation: SCDs. Monitor for any signs of DVT  3. Pain Management: Tylenol as needed  4. Mood/dementia: Patient appears at baseline  5. Neuropsych: This patient is not capable of making decisions on her own behalf.  6. Skin/Wound Care: Routine skin checks  7. GI bleed/AV malformation. Followup gastroscopy services. Monitor CBC. Continue Protonix daily  8. Diabetes mellitus with peripheral neuropathy. Hemoglobin A1c 7.0. Lantus insulin 8 units qhs   -more elevated in evening---added am lantus 5u---titrate further as needed 9. Hypertension.Reduce Toprol-XL 12.5 mg daily due to Bradycardia, D/C lisinopril 5 mg daily secondary to elevated creat, Imdur 60 mg daily. Add norvasc  -may need further titration reduce to <140/90 10. Hyperlipidemia. Lipitor  11. Chronic renal insufficiency. Baseline creatinine 3.97.   12. Diastolic congestive heart failure. Monitor for any  signs of fluid overload  13. Low grade temp  -IS  -E coli UTI---cipro for 7 days  LOS (Days) 8 A FACE TO FACE EVALUATION WAS PERFORMED  KIRSTEINS,ANDREW E 05/23/2014 8:08 AM

## 2014-05-23 NOTE — Progress Notes (Signed)
Physical Therapy Session Note  Patient Details  Name: [name] MRN: [id number] Date of Birth: 12-Jan-1933  Today's Date: 05/23/2014 Time: 1030-1100 Time Calculation (min): 30 min  Short Term Goals: Week 1:  PT Short Term Goal 1 (Week 1): STG=LTG due to ELOS, S overall with RW  Skilled Therapeutic Interventions/Progress Updates:  1:1. Pt received sitting in w/c, ready for therapy. Focus this session on functional endurance, furniture transfers and household ambulation. Pt w/ good tolerance to amb 100'x2 w/ RW and min guard A, 1x w/ therapist and 2x w/ son. Pt demonstrates slow, but steady pace. Pt req min guard A for ambulation in therapy apartment 15'x1 and 30'x1 w/ RW and assist provided by son. Pt req supervision for t/f sup<>sit on a standard bed and min A t/f sit<>stand from low chair w/out arms. Pt's son providing appropriate cueing and assistance throughout session. Pt left sitting in w/c w/ all needs in reach, quick release belt in place and son in room.   Therapy Documentation Precautions:  Precautions Precautions: Fall Restrictions Weight Bearing Restrictions: No   Pain: Pain Assessment Pain Assessment: No/denies pain Faces Pain Scale: Hurts even more Pain Type: Chronic pain Pain Location: Hip Pain Orientation: Left Pain Descriptors / Indicators: Aching Pain Onset: With Activity Pain Intervention(s): Rest  See FIM for current functional status  Therapy/Group: Individual Therapy  Gilmore Laroche 05/23/2014, 12:44 PM

## 2014-05-23 NOTE — Progress Notes (Signed)
Speech Language Pathology Daily Session Note  Patient Details  Name: [name] MRN: [id number] Date of Birth: [birth year]  Today's Date: 05/23/2014 Time: 1638-4536 Time Calculation (min): 45 min  Short Term Goals: Week 1: SLP Short Term Goal 1 (Week 1): Pt will sustain attention to functional tasks for 5-7 mintues with min cuing for redirection.  SLP Short Term Goal 2 (Week 1): Pt will improve basic problem solving during functional tasks for 80% accuracy with mod assist  SLP Short Term Goal 3 (Week 1): Pt will use external aids to facilitate improved recall of daily events and information for 80% accuracy with mod assist.   Skilled Therapeutic Interventions:  Pt was seen for skilled speech therapy targeting cognitive-linguistic goals.  Upon arrival, pt was seated upright in wheelchair.  Pt requested to use the bathroom with min question cues and benefited from min cues for planning and sequencing toilet transfer.  SLP facilitated the session with a structured verbal reasoning task with pt requiring overall mod assist to identify and generate solutions to problems in pictures, min cues to redirect pt to structured task with pt exhibiting periods of sustained attention for >7 minutes.  Mild-moderate word finding deficits were noted during task which improved with min semantic and phonemic cues.  Continue per current plan of care.     FIM:  Comprehension Comprehension Mode: Auditory Comprehension: 4-Understands basic 75 - 89% of the time/requires cueing 10 - 24% of the time Expression Expression Mode: Verbal Expression: 3-Expresses basic 50 - 74% of the time/requires cueing 25 - 50% of the time. Needs to repeat parts of sentences. Social Interaction Social Interaction: 4-Interacts appropriately 75 - 89% of the time - Needs redirection for appropriate language or to initiate interaction. Problem Solving Problem Solving: 2-Solves basic 25 - 49% of the time - needs direction more than half  the time to initiate, plan or complete simple activities Memory Memory: 2-Recognizes or recalls 25 - 49% of the time/requires cueing 51 - 75% of the time  Pain Pain Assessment Pain Assessment: No/denies pain  Therapy/Group: Individual Therapy  Windell Moulding, M.A. CCC-SLP  Obie Kallenbach, Selinda Orion 05/23/2014, 4:25 PM

## 2014-05-24 ENCOUNTER — Inpatient Hospital Stay (HOSPITAL_COMMUNITY): Payer: Medicare Other | Admitting: Physical Therapy

## 2014-05-24 ENCOUNTER — Inpatient Hospital Stay (HOSPITAL_COMMUNITY): Payer: Self-pay | Admitting: Speech Pathology

## 2014-05-24 ENCOUNTER — Inpatient Hospital Stay (HOSPITAL_COMMUNITY): Payer: Medicare Other

## 2014-05-24 LAB — GLUCOSE, CAPILLARY
Glucose-Capillary: 190 mg/dL — ABNORMAL HIGH (ref 70–99)
Glucose-Capillary: 195 mg/dL — ABNORMAL HIGH (ref 70–99)
Glucose-Capillary: 220 mg/dL — ABNORMAL HIGH (ref 70–99)
Glucose-Capillary: 201 mg/dL — ABNORMAL HIGH (ref 70–99)

## 2014-05-24 NOTE — Progress Notes (Signed)
Physical Therapy Discharge Summary  Patient Details  Name: [name] MRN: [id number] Date of Birth: [birth year]  Today's Date: 05/24/2014 Time: 0347-4259 Time Calculation (min): 64 min  Patient has met 7 of 7 long term goals due to improved activity tolerance, improved balance, improved postural control, increased strength, ability to compensate for deficits, functional use of  left upper extremity and left lower extremity, improved attention, improved awareness and improved coordination.  Patient to discharge at an ambulatory level Supervision-min A.   Patient's care partner is independent to provide the necessary physical and cognitive assistance at discharge.  Reasons goals not met: NA  Recommendation:  Patient will benefit from ongoing skilled PT services in home health setting to continue to advance safe functional mobility, address ongoing impairments in muscle weakness, activity tolerance, balance, attention, awareness and coordination, and minimize fall risk.  Equipment: No equipment provided-Pt has RW and transport chair  Reasons for discharge: treatment goals met and discharge from hospital  Patient/family agrees with progress made and goals achieved: Yes  Skilled Therapeutic Interventions Pt received seated in w/c, son [name] present but departing during session. Pt requesting to use bathroom, ambulated to/from bathroom and to sink to wash hands using RW with close supervision, vc's for safety with hand placement and RW management. Pt performed hygiene with SBA and UE support on grab bar. Gait training using RW x 150 ft in controlled environment and x 50 ft in home environment with close supervision. Pt with improved hand placement for sit <> stand transfers throughout session. Pt negotiated up/down curb step x 2 using RW and min A, verbal cues for technique and steadying RW. Pt negotiated up/down 5 stairs x 2 using B rails and min A, verbal cues for sequencing and attending to  L UE on rail. Pt performed sit <> supine on mat with mod I, electing to transition to long sit at EOB vs sidelying position, and performed mat <> w/c transfers using RW and supervision. Answered patient and son questions regarding discharge and equipment. Pt returned to room and left sitting in w/c with quick release belt on for next therapy session.   PT Discharge Precautions/Restrictions Precautions Precautions: Fall Restrictions Weight Bearing Restrictions: No Vital Signs Therapy Vitals Temp: 98.6 F (37 C) Temp src: Oral Pulse Rate: 76 Resp: 16 BP: 132/49 mmHg Patient Position (if appropriate): Sitting Oxygen Therapy SpO2: 100 % Pain Pain Assessment Pain Assessment: No/denies pain Vision/Perception  Vision - Assessment Eye Alignment: Within Functional Limits Praxis Praxis-Other Comments: min impaired  Cognition Overall Cognitive Status: Impaired/Different from baseline Arousal/Alertness: Awake/alert Orientation Level: Oriented X4 Attention: Selective Selective Attention: Impaired Selective Attention Impairment: Functional basic;Verbal basic Sensation Sensation Light Touch: Appears Intact Stereognosis: Appears Intact Hot/Cold: Appears Intact Proprioception: Appears Intact Coordination Gross Motor Movements are Fluid and Coordinated: No Fine Motor Movements are Fluid and Coordinated: No Heel Shin Test: Decreased excursion Motor  Motor Motor: Hemiplegia;Motor perseverations Motor - Skilled Clinical Observations: Mild L-sided weakness  Mobility Bed Mobility Bed Mobility: Supine to Sit;Sit to Supine Supine to Sit: 6: Modified independent (Device/Increase time) Supine to Sit Details (indicate cue type and reason): Pt performs long sitting to EOB Sit to Supine: 6: Modified independent (Device/Increase time) Transfers Transfers: Yes Stand Pivot Transfers: 5: Supervision Stand Pivot Transfer Details: Verbal cues for technique Stand Pivot Transfer Details (indicate  cue type and reason): verbal cues for safe hand placement and positioning of RW Locomotion  Ambulation Ambulation: Yes Ambulation/Gait Assistance: 5: Supervision Ambulation Distance (Feet): 150 Feet Assistive  device: Rolling walker Ambulation/Gait Assistance Details: Verbal cues for safe use of DME/AE Gait Gait: Yes Gait Pattern: Impaired Gait velocity: significantly decreased Stairs / Additional Locomotion Stairs: Yes Stairs Assistance: 4: Min assist Stair Management Technique: Two rails;Step to pattern;Forwards;Alternating pattern Number of Stairs: 5 Height of Stairs: 6 Curb: 4: Min assist (RW) Product manager Mobility: No  Trunk/Postural Assessment  Cervical Assessment Cervical Assessment: Within Psychologist, forensic Assessment Thoracic Assessment: Exceptions to [facility] Thoracic AROM Overall Thoracic AROM Comments: kyphotic Lumbar Assessment Lumbar Assessment: Exceptions to [facility] Lumbar AROM Overall Lumbar AROM Comments: decreased extensibility Postural Control Postural Control: Deficits on evaluation Righting Reactions: Delayed Protective Responses: Impaired  Balance Balance Balance Assessed: Yes Static Standing Balance Static Standing - Balance Support: Bilateral upper extremity supported;During functional activity Static Standing - Level of Assistance: 5: Stand by assistance Static Standing - Comment/# of Minutes: pt unable to remove UE support, increased tremors in standing without AD Dynamic Standing Balance Dynamic Standing - Balance Support: During functional activity;Bilateral upper extremity supported Dynamic Standing - Level of Assistance: 5: Stand by assistance Extremity Assessment  RUE Assessment RUE Assessment: Exceptions to [place] Overall Strength Comments: general weakness LUE Assessment LUE Assessment: Exceptions to [facility] LUE Strength LUE Overall Strength Comments: general weakness RLE Assessment RLE Assessment:  Exceptions to [facility] RLE Strength RLE Overall Strength Comments: grossly 3+ to 4+/5 throughout LLE Assessment LLE Assessment: Exceptions to [facility] LLE Strength LLE Overall Strength Comments: grossly 3+ to 4-/5 throughout  See FIM for current functional status  Laretta Alstrom 05/24/2014, 2:32 PM

## 2014-05-24 NOTE — Progress Notes (Signed)
Speech Language Pathology Discharge Summary  Patient Details  Name: YECENIA DALGLEISH MRN: [id number] Date of Birth: [birth year]  Today's Date: 05/24/2014 Time: 4497-5300 Time Calculation (min): 60 min  Skilled Therapeutic Interventions:  Pt was seen for skilled speech therapy targeting cognitive-linguistic goals.  SLP facilitated the session with a basic categorization task targeting functional word finding and thought organization with pt requiring overall mod faded to min assist verbal and visual cues to complete task for ~80% accuracy .  Pt required intermittent min cuing to correct verbal perseveration of written visual aids.   Pt was noted with improved word finding during self-directed functional conversations related to her family and other personal interests, which was also noted to improve her affect.  Once returned to room, pt benefited from overall min assist multimodal cues to initiate and sequence functional transfer from wheelchair to bed.     Patient has met 2 of 4 long term goals.  Patient to discharge at [facility] level.  Reasons goals not met: adequate for discharge home, baseline impairments    Clinical Impression/Discharge Summary:  Pt made functional gains while on inpatient and is discharging having met 2 out of 4 long term goals secondary to improved attention and problem solving for basic, familiar tasks.  Pt currently requires an overall min level assist for cognitive-linguistic tasks due to decreased recall of daily information and decreased word finding during conversations.  Pt is discharging home with 24/7 supervision from family and would benefit from follow up speech therapy to maximize functional independence and reduce burden of care.     Care Partner:  Caregiver Able to Provide Assistance: Yes  Type of Caregiver Assistance: Physical;Cognitive  Recommendation:  24 hour supervision/assistance;Home Health SLP  Rationale for SLP Follow Up: Maximize functional  communication;Maximize swallowing safety;Reduce caregiver burden;Maximize cognitive function and independence   Equipment: none recommended by SLP   Reasons for discharge: Discharged from hospital   Patient/Family Agrees with Progress Made and Goals Achieved: Yes   See FIM for current functional status  Windell Moulding, M.A. CCC-SLP  Jerel Sardina, Selinda Orion 05/24/2014, 4:30 PM

## 2014-05-24 NOTE — Progress Notes (Signed)
Social Work Patient ID: [name], female   DOB: 04-23-1933, 80 y.o.   MRN: [id number]  CSW met with pt and son to update them on team conference discussion.  Pt continues to be on track for d/c tomorrow.  Son and pt both feel comfortable with this plan.  Son wondered if pt would benefit from a bedside commode and team agreed.  CSW ordered [facility] from [place].  CSW confirmed with son that [facility] Go would follow pt at home for home care f/u.  Family education has been completed and no other needs identified at this time.  CSW remains available should needs arise.

## 2014-05-24 NOTE — Patient Care Conference (Signed)
Inpatient RehabilitationTeam Conference and Plan of Care Update Date: 05/24/2014   Time: 10:35 AM    Patient Name: [name]      Medical Record Number: [id number]  Date of Birth: 1933/03/30 Sex: Female         Room/Bed: 4W03C/4W03C-01 Payor Info: Payor: MEDICARE / Plan: MEDICARE PART A AND B / Product Type: *No Product type* /    Admitting Diagnosis: RT CVA PARIETAL LOBE INFARCT  Admit Date/Time:  05/15/2014  3:24 PM Admission Comments: No comment available   Primary Diagnosis:  <principal problem not specified> Principal Problem: <principal problem not specified>  Patient Active Problem List   Diagnosis Date Noted  . Angiodysplasia of intestine with hemorrhage 05/13/2014  . Acute ischemic stroke 05/11/2014  . Anemia 05/10/2014  . GI bleed 05/10/2014  . Melena 05/10/2014  . Ataxia, late effect of cerebrovascular disease 03/23/2014  . Atrial fibrillation 03/02/2014  . CVA (cerebral infarction) 02/10/2014  . Erosive gastritis with hemorrhage 02/08/2014  . Acute bilat watershed infarction vs. embolic strokes 09/38/1829  . Chronic diastolic CHF EF 93% 71/69/6789  . NSTEMI (non-ST elevated myocardial infarction) 02/04/2014  . Nonischemic cardiomyopathy 12/12/2013  . ARF (acute renal failure) 12/05/2013  . Memory difficulties 12/04/2013  . Pulmonary edema 12/03/2013  . Acute diastolic CHF (congestive heart failure) 12/03/2013  . Irregular heart rhythm 02/26/2012  . Ventricular bigeminy 02/26/2012  . CAD (coronary artery disease)   . CEREBRAL EMBOLISM, WITH INFARCTION 07/02/2010  . OSTEOPOROSIS 03/21/2009  . DEGENERATIVE JOINT DISEASE 07/24/2008  . INSOMNIA-SLEEP DISORDER-UNSPEC 07/24/2008  . Iron deficiency anemia secondary to blood loss (chronic) 01/06/2008  . OBSTRUCTIVE SLEEP APNEA 01/06/2008  . GASTROPARESIS, DIABETIC 08/03/2006  . HYPERLIPIDEMIA 08/03/2006  . PERIPHERAL NEUROPATHY 08/03/2006  . HYPERTENSION 08/03/2006  . Chronic diastolic congestive heart failure  08/03/2006  . REACTIVE AIRWAY DISEASE 08/03/2006  . GERD 08/03/2006  . DM (diabetes mellitus), type 2, uncontrolled 11/04/1983    Expected Discharge Date: Expected Discharge Date: 05/25/14  Team Members Present: Physician leading conference: Dr. Alger Simons Social Worker Present: Alfonse Alpers, LCSW;Lucy Alamo, [place] Nurse Present: Nanine Means, RN PT Present: Billie Ruddy, PT OT Present: Simonne Come, Artemio Aly, OT SLP Present: Windell Moulding, SLP PPS Coordinator present : Daiva Nakayama, RN, CRRN     Current Status/Progress Goal Weekly Team Focus  Medical   Left neglect, amb better 119ft  maintain stability for D/C  Additional caregiver training   Bowel/Bladder   Cont. of bowel and bladder   To remain continent of bladder and bowel  Assess bowel and bladder a shift   Swallow/Nutrition/ Hydration             ADL's   supervision overall; min A LB dressing; min verbal cues for task initiation and sequencing  supervision overall; min A LB dressing  family education, activity tolerance   Mobility   Supervision for bed mobility, Min guard-min A for ambulation w/ RW up to 100', curb step negotiation and transfers  S all mobility including gait x150' with RW, Min A 2 steps  pt/family education and training; activity tolerance, strengthening, balance, NMR, gait training, ambulation   Communication   Min-mod assist   min assist   functional communication to indicate needs/wants   Safety/Cognition/ Behavioral Observations  min-mod assist for basic, familiar tasks  min assist   sustained attention, orientation, recall of daily events, and informations    Pain   No c/o pain   Pain <3  Assess for and treat pain q  shift with prn meds   Skin   No skin issues  No new skin breakdown   Monitor skin q shift    Rehab Goals Patient on target to meet rehab goals: Yes Rehab Goals Revised: None *See Care Plan and progress notes for long and short-term goals.  Barriers to  Discharge: needs 24/7 sup, difficulty getting through front door at home    Possible Resolutions to Barriers:  Help family coordinate     Discharge Planning/Teaching Needs:  Home with son, PCS caregiver, and [facility] - they were providing 24 hr supervision prior to admission  Son has been present for family education and it has gone well.  He is asking if pt would benefit from beside commode.  Team agrees she would and CSW ordered it.   Team Discussion:  Pt continues to have left neglect, but has good strength.  She has engaged better this admission per SLP.  Pt does not initiate conversation and has word finding difficulties.  She has some impulsiveness.  Family education went well and family is very involved.  Son is primary caregiver.  Revisions to Treatment Plan:  None   Continued Need for Acute Rehabilitation Level of Care: The patient requires daily medical management by a physician with specialized training in physical medicine and rehabilitation for the following conditions: Daily direction of a multidisciplinary physical rehabilitation program to ensure safe treatment while eliciting the highest outcome that is of practical value to the patient.: Yes Daily medical management of patient stability for increased activity during participation in an intensive rehabilitation regime.: Yes Daily analysis of laboratory values and/or radiology reports with any subsequent need for medication adjustment of medical intervention for : Neurological problems;Other  Alondra Vandeven, Silvestre Mesi 05/24/2014, 2:28 PM

## 2014-05-24 NOTE — Progress Notes (Signed)
[place] PHYSICAL MEDICINE & REHABILITATION     PROGRESS NOTE    Subjective/Complaints: Sitting in wheelchair Trying to use calendar but misses words on Left side ROS limited due to language/cognition  Objective: Vital Signs: Blood pressure 164/66, pulse 77, temperature 98 F (36.7 C), temperature source Oral, resp. rate 18, height 5' 2.5" (1.588 m), weight 70.398 kg (155 lb 3.2 oz), last menstrual period 12/19/1968, SpO2 100.00%. No results found.  Recent Labs  05/22/14 0557  WBC 7.8  HGB 8.2*  HCT 26.5*  PLT 303    Recent Labs  05/22/14 0557  NA 140  K 4.5  CL 106  GLUCOSE 164*  BUN 29*  CREATININE 1.66*  CALCIUM 8.7   CBG (last 3)   Recent Labs  05/23/14 1639 05/23/14 2115 05/24/14 0728  GLUCAP 269* 236* 190*    Wt Readings from Last 3 Encounters:  05/18/14 70.398 kg (155 lb 3.2 oz)  05/10/14 70.7 kg (155 lb 13.8 oz)  05/10/14 70.7 kg (155 lb 13.8 oz)    Physical Exam:  Gen: a little slow to arouse HENT: oral mucosa moist  Head: Normocephalic.  Eyes: EOM are normal.  Neck: Normal range of motion. Neck supple. No thyromegaly present.  Cardiovascular:  Cardiac rate controlled  Respiratory: Effort normal and breath sounds normal. No respiratory distress.  GI: Soft. Bowel sounds are normal. She exhibits no distension.  Neurological: She is alert.  Expressive language deficits a little better. Follows some simple  commands . Limitations in left sided motor vs right but Manual muscle testing: 3-4/5 LUE and LLE.  Senses pain in all 4's.   Skin: Skin is warm and dry  Psych: flat,  cooperative   Assessment/Plan: 1. Functional deficits secondary to right parietal lobe infarct which require 3+ hours per day of interdisciplinary therapy in a comprehensive inpatient rehab setting. Physiatrist is providing close team supervision and 24 hour management of active medical problems listed below. Physiatrist and rehab team continue to assess barriers to  discharge/monitor patient progress toward functional and medical goals. Plan D/C in am Team conference today please see physician documentation under team conference tab, met with team face-to-face to discuss problems,progress, and goals. Formulized individual treatment plan based on medical history, underlying problem and comorbidities. FIM: FIM - Bathing Bathing Steps Patient Completed: Chest;Right Arm;Left Arm;Abdomen;Front perineal area;Buttocks;Right upper leg;Left upper leg;Right lower leg (including foot);Left lower leg (including foot) Bathing: 5: Set-up assist to: Obtain items  FIM - Upper Body Dressing/Undressing Upper body dressing/undressing steps patient completed: Thread/unthread right sleeve of pullover shirt/dresss;Pull shirt over trunk;Thread/unthread left sleeve of pullover shirt/dress Upper body dressing/undressing: 4: Min-Patient completed 75 plus % of tasks FIM - Lower Body Dressing/Undressing Lower body dressing/undressing steps patient completed: Thread/unthread right underwear leg;Thread/unthread left underwear leg;Pull underwear up/down;Thread/unthread right pants leg;Thread/unthread left pants leg;Pull pants up/down;Don/Doff right sock;Don/Doff left sock;Fasten/unfasten left shoe Lower body dressing/undressing: 4: Min-Patient completed 75 plus % of tasks  FIM - Toileting Toileting steps completed by patient: Adjust clothing prior to toileting;Performs perineal hygiene;Adjust clothing after toileting Toileting Assistive Devices: Grab bar or rail for support Toileting: 5: Supervision: Safety issues/verbal cues  FIM - Radio producer Devices: Mining engineer Transfers: 4-To toilet/BSC: Min A (steadying Pt. > 75%);5-From toilet/BSC: Supervision (verbal cues/safety issues)  FIM - Control and instrumentation engineer Devices: Walker;Arm rests Bed/Chair Transfer: 4: Chair or W/C > Bed: Min A (steadying Pt. > 75%);5: Supine >  Sit: Supervision (verbal cues/safety issues);5: Sit > Supine: Supervision (verbal  cues/safety issues);4: Bed > Chair or W/C: Min A (steadying Pt. > 75%)  FIM - Locomotion: Wheelchair Distance: 30 Locomotion: Wheelchair: 1: Total Assistance/staff pushes wheelchair (Pt<25%) FIM - Locomotion: Ambulation Locomotion: Ambulation Assistive Devices: Administrator Ambulation/Gait Assistance: 4: Min guard Locomotion: Ambulation: 2: Travels 50 - 149 ft with minimal assistance (Pt.>75%)  Comprehension Comprehension Mode: Auditory Comprehension: 4-Understands basic 75 - 89% of the time/requires cueing 10 - 24% of the time  Expression Expression Mode: Verbal Expression: 3-Expresses basic 50 - 74% of the time/requires cueing 25 - 50% of the time. Needs to repeat parts of sentences.  Social Interaction Social Interaction: 4-Interacts appropriately 75 - 89% of the time - Needs redirection for appropriate language or to initiate interaction.  Problem Solving Problem Solving: 2-Solves basic 25 - 49% of the time - needs direction more than half the time to initiate, plan or complete simple activities  Memory Memory: 2-Recognizes or recalls 25 - 49% of the time/requires cueing 51 - 75% of the time  Medical Problem List and Plan:  1. Functional deficits secondary to right parietal lobe infarct as well as history of CVA May of 2015  2. DVT Prophylaxis/Anticoagulation: SCDs. Monitor for any signs of DVT  3. Pain Management: Tylenol as needed  4. Mood/dementia: Patient appears at baseline  5. Neuropsych: This patient is not capable of making decisions on her own behalf.  6. Skin/Wound Care: Routine skin checks  7. GI bleed/AV malformation. Followup gastroscopy services. Monitor CBC. Continue Protonix daily  8. Diabetes mellitus with peripheral neuropathy. Hemoglobin A1c 7.0. Lantus insulin 8 units qhs   -more elevated in evening---added am lantus 5u---titrate further as needed 9. Hypertension.Reduce  Toprol-XL 12.5 mg daily due to Bradycardia, D/C lisinopril 5 mg daily secondary to elevated creat, Imdur 60 mg daily. Add norvasc  -may need further titration reduce to <140/90 10. Hyperlipidemia. Lipitor  11. Chronic renal insufficiency. Baseline creatinine 3.56.   12. Diastolic congestive heart failure. Monitor for any signs of fluid overload  13. Low grade temp  -IS  -E coli UTI---cipro for 7 days  LOS (Days) 9 A FACE TO FACE EVALUATION WAS PERFORMED  Alysia Penna E 05/24/2014 7:45 AM

## 2014-05-24 NOTE — Discharge Summary (Signed)
Discharge summary job [id number]

## 2014-05-24 NOTE — Progress Notes (Signed)
Inpatient Diabetes Program Recommendations  AACE/ADA: New Consensus Statement on Inpatient Glycemic Control (2013)  Target Ranges:  Prepandial:   less than 140 mg/dL      Peak postprandial:   less than 180 mg/dL (1-2 hours)      Critically ill patients:  140 - 180 mg/dL     Results for WHITLEY, PATCHEN (MRN [id number]) as of 05/24/2014 07:53  Ref. Range 05/23/2014 06:57 05/23/2014 11:22 05/23/2014 16:39 05/23/2014 21:15  Glucose-Capillary Latest Range: 70-99 mg/dL 168 (H) 212 (H) 269 (H) 236 (H)    Results for CINDE, EBERT (MRN [id number]) as of 05/24/2014 07:53  Ref. Range 05/24/2014 07:28  Glucose-Capillary Latest Range: 70-99 mg/dL 190 (H)    Fasting glucose still elevated above goal.  Patient also having elevated postprandial glucose levels.  Current Orders:  Lantus 5 units QAM Lantus 8 units QHS Novolog Moderate SSI tid ac/HS    MD- Please consider the following:  1. Increase AM dose of Lantus to 8 units QAM 2. Add Novolog Meal Coverage- Novolog 3 units tid with meals (pt eating 100% of meals)    Will follow Wyn Quaker RN, MSN, CDE Diabetes Coordinator Inpatient Diabetes Program Team Pager: [phone] (8a-10p)

## 2014-05-24 NOTE — Discharge Instructions (Signed)
Inpatient Rehab Discharge Instructions  [name] Discharge date and time: No discharge date for patient encounter.   Activities/Precautions/ Functional Status: Activity: activity as tolerated Diet: diabetic diet Wound Care: none needed Functional status:  ___ No restrictions     ___ Walk up steps independently ___ 24/7 supervision/assistance   ___ Walk up steps with assistance ___ Intermittent supervision/assistance  ___ Bathe/dress independently ___ Walk with walker     ___ Bathe/dress with assistance ___ Walk Independently    ___ Shower independently _x__ Walk with assistance    ___ Shower with assistance ___ No alcohol     ___ Return to work/school ________   COMMUNITY REFERRALS UPON DISCHARGE:    Home Health:   PT     OT     ST    RN                     Agency: [facility] Phone: [phone]   Medical Equipment/Items Ordered:  Bedside commode                                                      Agency/Supplier: [facility] @ [phone]   GENERAL COMMUNITY RESOURCES FOR PATIENT/FAMILY:  Support Groups: Stroke Support Group     Special Instructions:    My questions have been answered and I understand these instructions. I will adhere to these goals and the provided educational materials after my discharge from the hospital.  Patient/Caregiver Signature _______________________________ Date __________  Clinician Signature _______________________________________ Date __________  Please bring this form and your medication list with you to all your follow-up doctor's appointments.  STROKE/TIA DISCHARGE INSTRUCTIONS SMOKING Cigarette smoking nearly doubles your risk of having a stroke & is the single most alterable risk factor  If you smoke or have smoked in the last 12 months, you are advised to quit smoking for your health.  Most of the excess cardiovascular risk related to smoking disappears within a year of stopping.  Ask you doctor about anti-smoking  medications  [place] Quit Line: 1-800-QUIT NOW  Free Smoking Cessation Classes [phone]  CHOLESTEROL Know your levels; limit fat & cholesterol in your diet  Lipid Panel     Component Value Date/Time   CHOL 130 02/09/2014 0650   TRIG 61 02/09/2014 0650   HDL 60 02/09/2014 0650   CHOLHDL 2.2 02/09/2014 0650   VLDL 12 02/09/2014 0650   LDLCALC 58 02/09/2014 0650      Many patients benefit from treatment even if their cholesterol is at goal.  Goal: Total Cholesterol (CHOL) less than 160  Goal:  Triglycerides (TRIG) less than 150  Goal:  HDL greater than 40  Goal:  LDL (LDLCALC) less than 100   BLOOD PRESSURE American Stroke Association blood pressure target is less that 120/80 mm/Hg  Your discharge blood pressure is:  BP: 182/76 mmHg  Monitor your blood pressure  Limit your salt and alcohol intake  Many individuals will require more than one medication for high blood pressure  DIABETES (A1c is a blood sugar average for last 3 months) Goal HGBA1c is under 7% (HBGA1c is blood sugar average for last 3 months)  Diabetes:     Lab Results  Component Value Date   HGBA1C 7.0* 05/11/2014     Your HGBA1c can be lowered with  medications, healthy diet, and exercise.  Check your blood sugar as directed by your physician  Call your physician if you experience unexplained or low blood sugars.  PHYSICAL ACTIVITY/REHABILITATION Goal is 30 minutes at least 4 days per week  Activity: Increase activity slowly, Therapies: Physical Therapy: Home Health Return to work:   Activity decreases your risk of heart attack and stroke and makes your heart stronger.  It helps control your weight and blood pressure; helps you relax and can improve your mood.  Participate in a regular exercise program.  Talk with your doctor about the best form of exercise for you (dancing, walking, swimming, cycling).  DIET/WEIGHT Goal is to maintain a healthy weight  Your discharge diet is: Carb Control  liquids Your  height is:  Height: 5' 2.5" (158.8 cm) Your current weight is: Weight: 70.398 kg (155 lb 3.2 oz) Your Body Mass Index (BMI) is:  BMI (Calculated): 28.8  Following the type of diet specifically designed for you will help prevent another stroke.  Your goal weight range is:    Your goal Body Mass Index (BMI) is 19-24.  Healthy food habits can help reduce 3 risk factors for stroke:  High cholesterol, hypertension, and excess weight.  RESOURCES Stroke/Support Group:  Call [phone]   STROKE EDUCATION PROVIDED/REVIEWED AND GIVEN TO PATIENT Stroke warning signs and symptoms How to activate emergency medical system (call 911). Medications prescribed at discharge. Need for follow-up after discharge. Personal risk factors for stroke. Pneumonia vaccine given:  Flu vaccine given:  My questions have been answered, the writing is legible, and I understand these instructions.  I will adhere to these goals & educational materials that have been provided to me after my discharge from the hospital.

## 2014-05-24 NOTE — Progress Notes (Signed)
Occupational Therapy Discharge Summary  Patient Details  Name: JULENA BARBOUR MRN: [id number] Date of Birth: 1933-05-19  Today's Date: 05/24/2014 Time: 1000-1100 Time Calculation (min): 60 min  Patient has met 8 of 8 long term goals due to improved activity tolerance, improved balance, postural control, ability to compensate for deficits, functional use of  RIGHT upper and LEFT upper extremity, improved attention, improved awareness and improved coordination.  Patient to discharge at overall Supervision level.  Patient's care partner is independent to provide the necessary physical and cognitive assistance at discharge.    Reasons goals not met: N/A. All LTGs met.  Recommendation:  Patient will benefit from ongoing skilled OT services in home health setting to continue to advance functional skills in the area of BADL.  Equipment: BSC  Reasons for discharge: treatment goals met and discharge from hospital  Patient/family agrees with progress made and goals achieved: Yes  Skilled Therapeutic Intervention Pt seen for ADL retraining with focus on functional mobility, functional transfers, initiation, and visual scanning to left. Pt ambulated bed>bathroom at supervision level using RW. Completed toileting and bathing at shower level at supervision with use of LH sponge. Completed dressing from w/c with min assist for shoes and increased time. Pt located items on left side with increased time and min cues. Pt's son present at end of session and reporting no questions or concerns at this time.   OT Discharge Precautions/Restrictions  Precautions Precautions: Fall Restrictions Weight Bearing Restrictions: No General   Vital Signs Therapy Vitals Pulse Rate: 74 BP: 180/64 mmHg Pain Pain Assessment Pain Assessment: No/denies pain ADL ADL ADL Comments: refer to FIM Vision/Perception  Vision- History Patient Visual Report: No change from baseline Vision- Assessment Eye Alignment:  Within Functional Limits Praxis Praxis-Other Comments: min impaired  Cognition Overall Cognitive Status: Impaired/Different from baseline Arousal/Alertness: Awake/alert Orientation Level: Oriented X4 Attention: Selective Selective Attention: Impaired Selective Attention Impairment: Functional basic;Verbal basic Memory: Impaired Memory Impairment: Retrieval deficit;Decreased recall of new information;Storage deficit Awareness: Impaired Problem Solving: Impaired Safety/Judgment: Impaired Sensation Sensation Light Touch: Appears Intact Stereognosis: Appears Intact Hot/Cold: Appears Intact Proprioception: Appears Intact Coordination Gross Motor Movements are Fluid and Coordinated: No Fine Motor Movements are Fluid and Coordinated: No Motor    Mobility     Trunk/Postural Assessment     Balance   Extremity/Trunk Assessment RUE Assessment RUE Assessment: Exceptions to [facility] RUE Strength RUE Overall Strength Comments: general weakness LUE Assessment LUE Assessment: Exceptions to [facility] LUE Strength LUE Overall Strength Comments: general weakness  See FIM for current functional status  Drelyn Pistilli, Quillian Quince 05/24/2014, 10:51 AM

## 2014-05-24 NOTE — Progress Notes (Signed)
Social Work Patient ID: [name], female   DOB: 1933-01-13, 80 y.o.   MRN: [id number]  Silvestre Mesi Marwa Fuhrman, LCSW Social Worker Signed  Patient Care Conference Service date: 05/24/2014 2:17 PM  Inpatient RehabilitationTeam Conference and Plan of Care Update Date: 05/24/2014   Time: 10:35 AM     Patient Name: [name]       Medical Record Number: [id number]   Date of Birth: August 29, 1933 Sex: Female         Room/Bed: 4W03C/4W03C-01 Payor Info: Payor: MEDICARE / Plan: MEDICARE PART A AND B / Product Type: *No Product type* /   Admitting Diagnosis: RT CVA PARIETAL LOBE INFARCT   Admit Date/Time:  05/15/2014  3:24 PM Admission Comments: No comment available   Primary Diagnosis:  <principal problem not specified> Principal Problem: <principal problem not specified>    Patient Active Problem List     Diagnosis  Date Noted   .  Angiodysplasia of intestine with hemorrhage  05/13/2014   .  Acute ischemic stroke  05/11/2014   .  Anemia  05/10/2014   .  GI bleed  05/10/2014   .  Melena  05/10/2014   .  Ataxia, late effect of cerebrovascular disease  03/23/2014   .  Atrial fibrillation  03/02/2014   .  CVA (cerebral infarction)  02/10/2014   .  Erosive gastritis with hemorrhage  02/08/2014   .  Acute bilat watershed infarction vs. embolic strokes  16/96/7893   .  Chronic diastolic CHF EF 81%  01/75/1025   .  NSTEMI (non-ST elevated myocardial infarction)  02/04/2014   .  Nonischemic cardiomyopathy  12/12/2013   .  ARF (acute renal failure)  12/05/2013   .  Memory difficulties  12/04/2013   .  Pulmonary edema  12/03/2013   .  Acute diastolic CHF (congestive heart failure)  12/03/2013   .  Irregular heart rhythm  02/26/2012   .  Ventricular bigeminy  02/26/2012   .  CAD (coronary artery disease)     .  CEREBRAL EMBOLISM, WITH INFARCTION  07/02/2010   .  OSTEOPOROSIS  03/21/2009   .  DEGENERATIVE JOINT DISEASE  07/24/2008   .  INSOMNIA-SLEEP DISORDER-UNSPEC  07/24/2008   .  Iron  deficiency anemia secondary to blood loss (chronic)  01/06/2008   .  OBSTRUCTIVE SLEEP APNEA  01/06/2008   .  GASTROPARESIS, DIABETIC  08/03/2006   .  HYPERLIPIDEMIA  08/03/2006   .  PERIPHERAL NEUROPATHY  08/03/2006   .  HYPERTENSION  08/03/2006   .  Chronic diastolic congestive heart failure  08/03/2006   .  REACTIVE AIRWAY DISEASE  08/03/2006   .  GERD  08/03/2006   .  DM (diabetes mellitus), type 2, uncontrolled  11/04/1983     Expected Discharge Date: Expected Discharge Date: 05/25/14  Team Members Present: Physician leading conference: Dr. Alger Simons Social Worker Present: Alfonse Alpers, LCSW;[place], [place] Nurse Present: Nanine Means, RN PT Present: Billie Ruddy, PT OT Present: Simonne Come, Artemio Aly, OT SLP Present: Windell Moulding, SLP PPS Coordinator present : Daiva Nakayama, RN, CRRN        Current Status/Progress  Goal  Weekly Team Focus   Medical     Left neglect, amb better 163ft  maintain stability for D/C  Additional caregiver training   Bowel/Bladder     Cont. of bowel and bladder   To remain continent of bladder and bowel  Assess bowel and bladder a shift  Swallow/Nutrition/ Hydration            ADL's     supervision overall; min A LB dressing; min verbal cues for task initiation and sequencing  supervision overall; min A LB dressing  family education, activity tolerance   Mobility     Supervision for bed mobility, Min guard-min A for ambulation w/ RW up to 100', curb step negotiation and transfers  S all mobility including gait x150' with RW, Min A 2 steps  pt/family education and training; activity tolerance, strengthening, balance, NMR, gait training, ambulation   Communication     Min-mod assist   min assist   functional communication to indicate needs/wants   Safety/Cognition/ Behavioral Observations    min-mod assist for basic, familiar tasks  min assist   sustained attention, orientation, recall of daily events, and informations    Pain      No c/o pain   Pain <3  Assess for and treat pain q shift with prn meds   Skin     No skin issues  No new skin breakdown   Monitor skin q shift    Rehab Goals Patient on target to meet rehab goals: Yes Rehab Goals Revised: None *See Care Plan and progress notes for long and short-term goals.    Barriers to Discharge:  needs 24/7 sup, difficulty getting through front door at home      Possible Resolutions to Barriers:    Help family coordinate      Discharge Planning/Teaching Needs:    Home with son, PCS caregiver, and [facility] - they were providing 24 hr supervision prior to admission   Son has been present for family education and it has gone well.  He is asking if pt would benefit from beside commode.  Team agrees she would and CSW ordered it.    Team Discussion:    Pt continues to have left neglect, but has good strength.  She has engaged better this admission per SLP.  Pt does not initiate conversation and has word finding difficulties.  She has some impulsiveness.  Family education went well and family is very involved.  Son is primary caregiver.   Revisions to Treatment Plan:    None    Continued Need for Acute Rehabilitation Level of Care: The patient requires daily medical management by a physician with specialized training in physical medicine and rehabilitation for the following conditions: Daily direction of a multidisciplinary physical rehabilitation program to ensure safe treatment while eliciting the highest outcome that is of practical value to the patient.: Yes Daily medical management of patient stability for increased activity during participation in an intensive rehabilitation regime.: Yes Daily analysis of laboratory values and/or radiology reports with any subsequent need for medication adjustment of medical intervention for : Neurological problems;Other  Tyrease Vandeberg, Silvestre Mesi 05/24/2014, 2:28 PM

## 2014-05-24 NOTE — Discharge Summary (Signed)
[name], [name]                 ACCOUNT NO.:  [id number]  MEDICAL RECORD NO.:  [id number]  LOCATION:  4W03C                        FACILITY:  [facility]  PHYSICIAN:  Charlett Blake, M.D.DATE OF BIRTH:  Jul 26, 1933  DATE OF ADMISSION:  05/15/2014 DATE OF DISCHARGE:  05/25/2014                              DISCHARGE SUMMARY   DISCHARGE DIAGNOSES: 1. Functional deficits secondary to right parietal lobe infarction     with history of cerebrovascular accident in May 2015. 2. Sequential compression devices for deep vein thrombosis     prophylaxis. 3. Mood with history of dementia. 4. Gastrointestinal bleed with arteriovenous malformation. 5. Diabetes mellitus, peripheral neuropathy. 6. Hypertension. 7. Hyperlipidemia. 8. Chronic renal insufficiency with baseline creatinine 5.40. 9. Diastolic congestive heart failure. 10.Escherichia coli urinary tract infection resolved.  HISTORY OF PRESENT ILLNESS:  This is a 80 year old right-handed female with history of coronary artery disease, diabetes mellitus, peripheral neuropathy, diastolic congestive heart failure, chronic renal insufficiency, as well as GI bleed and early dementia.  The patient well known to Rehab Services from CVA in May 2015 with admit date Feb 10, 2014 to Feb 20, 2014.  She was discharged to home.  Required supervision for transfers and all mobility.  She had recently been placed on aspirin for both CVA prophylaxis standpoint cardiac history.  Presented May 10, 2014, with diffuse weakness and shortness of breath.  CBC showed a hemoglobin of 4.7.  Gastroenterology Service was consulted and colonoscopy completed on May 12, 2014, showing a normal colon. Underwent enteroscopy in May 13, 2014, with AV malformation found on the proximal jejunum.  The remainder of the small bowel otherwise normal.  She was transfused and latest hemoglobin 9.1.  An MRI of the head completed due to the lethargy possible right-sided weakness  showing 2 areas of acute infarction in the right parietal lobe.  Placed on aspirin therapy for CVA prophylaxis.  Physical and occupational therapy is ongoing.  The patient was admitted for comprehensive rehab program.  PAST MEDICAL HISTORY:  See discharge diagnoses.  SOCIAL HISTORY:  Lives with family.  FUNCTIONAL HISTORY:  Prior to admission, required supervision with a rolling walker.  Functional status upon admission to [place] was moderate assist to ambulate 20 feet with a rolling walker, min-to-mod assist in activities of daily living.  PHYSICAL EXAMINATION:  VITAL SIGNS:  Blood pressure 166/60, pulse 73, temperature 99, respirations 24. GENERAL:  This was an alert female with expressive aphasia.  She followed simple commands yes/no questions would sometimes inaccurate. LUNGS:  Clear to auscultation. CARDIAC:  Regular rate and rhythm. ABDOMEN:  Soft, nontender.  Good bowel sounds.  Limitations in left- sided motor versus right but manual muscle testing difficult to assess secondary to her aphasia.  REHABILITATION HOSPITAL COURSE:  The patient was admitted to Inpatient Rehab Services with therapies initiated on a 3-hour daily basis consisting of physical therapy, occupational therapy, speech therapy, and 24-hour rehabilitation nursing.  The following issues were addressed during the patient's rehabilitation stay.  Pertaining to Ms. [name] right parietal lobe infarction, she remained stable on aspirin therapy. Follow up per Neurology Services.  Sequential compression devices were placed for DVT prophylaxis.  Follow up  per Gastroenterology Service, Dr. Carlean Purl for AV malformation.  CBC remained stable, maintained on Protonix, no further bleeding episodes.  She did have a history of diabetes mellitus, peripheral neuropathy.  Hemoglobin A1c of 7.0. Insulin therapy as advised with full diabetic teaching.  Blood pressures monitored closely, currently on Norvasc as well as  Imdur with Toprol. She had some mild bradycardia.  Her Toprol was decreased to 12.5 mg daily.  Lisinopril that she had been on was discontinued due to some elevated creatinine with chronic renal insufficiency, baseline creatinine 2.98, she would follow up with her primary MD.  She exhibited no signs of fluid overload.  She had completed a 7-day course of Cipro for E. coli urinary tract infection.  The patient received weekly collaborative interdisciplinary team conferences to discuss estimated length of stay, family teaching, and any barriers to discharge.  The patient ambulating 100 feet x2 rolling walker minimal guard. Demonstrated slow with steady pace.  Required minimal assist for ambulation and therapy apartment using her rolling walker  again assisted by her son.  Activities of daily living and homemaking including bathing, dressing, with sit to stand from wheelchair to sink, required minimal verbal cues to locate her clothing items that had been placed on the left.  She continued to participate fully with therapies, full family teaching was completed, and plan was to be discharged to home with home health physical and occupational therapy.  She was tolerating a regular diabetic diet.  She was able to communicate her needs with noted expressive aphasia.  DISCHARGE MEDICATIONS:  At the time of dictation included: 1. Norvasc 5 mg p.o. daily. 2. Aspirin 81 mg p.o. daily. 3. Lipitor 40 mg p.o. daily. 4. Cipro 250 mg daily complete course for E. coli urinary tract     infection. 5. Lantus insulin 5 units subcutaneous daily.  Lantus insulin 8 units     subcutaneously at bedtime. 6. Imdur 60 mg p.o. daily. 7. Magnesium oxide 400 mg p.o. b.i.d. 8. Toprol-XL 12.5 mg p.o. daily. 9. Protonix 40 mg p.o. daily.  DIET:  Regular diabetic diet.  SPECIAL INSTRUCTIONS:  The patient would follow Dr. Alysia Penna at the outpatient rehab center on July 07, 2014, arrival 9:30 a.m., Dr.  Antony Contras, 1 month call for appointment, Dr. Silvano Rusk as needed, Dr. Iona Beard, medical management.     [name], P.A.   ______________________________ Charlett Blake, M.D.    DA/MEDQ  D:  05/24/2014  T:  05/24/2014  Job:  [id number]  cc:   [name], MD,FACG Pramod P. Leonie Man, MD Barrie Folk. Berdine Addison, MD

## 2014-05-25 DIAGNOSIS — IMO0001 Reserved for inherently not codable concepts without codable children: Secondary | ICD-10-CM

## 2014-05-25 DIAGNOSIS — I5032 Chronic diastolic (congestive) heart failure: Secondary | ICD-10-CM

## 2014-05-25 DIAGNOSIS — Z5189 Encounter for other specified aftercare: Secondary | ICD-10-CM

## 2014-05-25 DIAGNOSIS — I6330 Cerebral infarction due to thrombosis of unspecified cerebral artery: Secondary | ICD-10-CM

## 2014-05-25 DIAGNOSIS — E1165 Type 2 diabetes mellitus with hyperglycemia: Secondary | ICD-10-CM

## 2014-05-25 DIAGNOSIS — I10 Essential (primary) hypertension: Secondary | ICD-10-CM

## 2014-05-25 LAB — GLUCOSE, CAPILLARY: Glucose-Capillary: 143 mg/dL — ABNORMAL HIGH (ref 70–99)

## 2014-05-25 MED ORDER — ISOSORBIDE MONONITRATE ER 60 MG PO TB24: 60.0000 mg | ORAL_TABLET | Freq: Every day | ORAL | Status: AC

## 2014-05-25 MED ORDER — OMEPRAZOLE 20 MG PO CPDR: 20.0000 mg | DELAYED_RELEASE_CAPSULE | Freq: Every day | ORAL | Status: DC

## 2014-05-25 MED ORDER — MAGNESIUM OXIDE 400 (241.3 MG) MG PO TABS: 400.0000 mg | ORAL_TABLET | Freq: Two times a day (BID) | ORAL | Status: AC

## 2014-05-25 MED ORDER — AMLODIPINE BESYLATE 10 MG PO TABS
10.0000 mg | ORAL_TABLET | Freq: Every day | ORAL | Status: DC
  Filled 2014-05-25: qty 1

## 2014-05-25 MED ORDER — METOPROLOL SUCCINATE 12.5 MG HALF TABLET: 12.5000 mg | ORAL_TABLET | Freq: Every day | ORAL | Status: DC

## 2014-05-25 MED ORDER — INSULIN GLARGINE 100 UNIT/ML ~~LOC~~ SOLN: SUBCUTANEOUS | Status: DC

## 2014-05-25 MED ORDER — ALBUTEROL SULFATE HFA 108 (90 BASE) MCG/ACT IN AERS: 2.0000 | INHALATION_SPRAY | Freq: Four times a day (QID) | RESPIRATORY_TRACT | Status: AC | PRN

## 2014-05-25 MED ORDER — ASPIRIN 81 MG PO TABS: 81.0000 mg | ORAL_TABLET | Freq: Every day | ORAL | Status: DC

## 2014-05-25 MED ORDER — ATORVASTATIN CALCIUM 40 MG PO TABS: 40.0000 mg | ORAL_TABLET | Freq: Every day | ORAL | Status: DC

## 2014-05-25 MED ORDER — AMLODIPINE BESYLATE 5 MG PO TABS: 5.0000 mg | ORAL_TABLET | Freq: Every day | ORAL | Status: DC

## 2014-05-25 NOTE — Progress Notes (Signed)
Pt discharged home with son. Discharge instructions provided by Silvestre Mesi, PA. All questions answered. Son verbalized understanding. Verified with son, that family will be administrating insulin injection at home, as pt has intermittent tremors that interferes with blood glucose check, and insulin administration. Also, clarified medication given this morning, to minimize duplication of medicine.

## 2014-05-25 NOTE — Progress Notes (Signed)
[facility] PHYSICAL MEDICINE & REHABILITATION     PROGRESS NOTE    Subjective/Complaints: Pt waiting on son and daughter to pick her up today No c/o ROS denies bowel or bladder issues, no breathing problems or pains  Objective: Vital Signs: Blood pressure 170/60, pulse 72, temperature 98.2 F (36.8 C), temperature source Oral, resp. rate 18, height 5' 2.5" (1.588 m), weight 68.6 kg (151 lb 3.8 oz), last menstrual period 12/19/1968, SpO2 100.00%. No results found. No results found for this basename: WBC, HGB, HCT, PLT,  in the last 72 hours No results found for this basename: NA, K, CL, CO, GLUCOSE, BUN, CREATININE, CALCIUM,  in the last 72 hours CBG (last 3)   Recent Labs  05/24/14 1654 05/24/14 2110 05/25/14 0727  GLUCAP 220* 201* 143*    Wt Readings from Last 3 Encounters:  05/24/14 68.6 kg (151 lb 3.8 oz)  05/10/14 70.7 kg (155 lb 13.8 oz)  05/10/14 70.7 kg (155 lb 13.8 oz)    Physical Exam:  Gen: a little slow to arouse HENT: oral mucosa moist  Head: Normocephalic.  Eyes: EOM are normal.  Neck: Normal range of motion. Neck supple. No thyromegaly present.  Cardiovascular:  Cardiac rate controlled  Respiratory: Effort normal and breath sounds normal. No respiratory distress.  GI: Soft. Bowel sounds are normal. She exhibits no distension.  Neurological: She is alert.  Expressive language deficits a little better. Follows some simple  commands . Limitations in left sided motor vs right but Manual muscle testing: 3-4/5 LUE and LLE.  Senses pain in all 4's.   Skin: Skin is warm and dry  Psych: flat,  cooperative   Assessment/Plan: 1. Functional deficits secondary to right parietal lobe infarct  Stable for D/C today F/u PCP in 1-2 weeks F/u PM&R 3 weeks See D/C summary See D/C instructions FIM: FIM - Bathing Bathing Steps Patient Completed: Chest;Right Arm;Left Arm;Abdomen;Front perineal area;Buttocks;Right upper leg;Left upper leg;Right lower leg (including  foot);Left lower leg (including foot) Bathing: 5: Set-up assist to: Obtain items  FIM - Upper Body Dressing/Undressing Upper body dressing/undressing steps patient completed: Thread/unthread right sleeve of pullover shirt/dresss;Pull shirt over trunk;Thread/unthread left sleeve of pullover shirt/dress;Put head through opening of pull over shirt/dress Upper body dressing/undressing: 5: Supervision: Safety issues/verbal cues FIM - Lower Body Dressing/Undressing Lower body dressing/undressing steps patient completed: Thread/unthread right underwear leg;Thread/unthread left underwear leg;Pull underwear up/down;Thread/unthread right pants leg;Thread/unthread left pants leg;Pull pants up/down;Don/Doff right sock;Don/Doff left sock;Fasten/unfasten left shoe Lower body dressing/undressing: 4: Min-Patient completed 75 plus % of tasks  FIM - Toileting Toileting steps completed by patient: Adjust clothing prior to toileting;Performs perineal hygiene;Adjust clothing after toileting Toileting Assistive Devices: Grab bar or rail for support Toileting: 5: Supervision: Safety issues/verbal cues  FIM - Radio producer Devices: Mining engineer Transfers: 5-From toilet/BSC: Supervision (verbal cues/safety issues);5-To toilet/BSC: Supervision (verbal cues/safety issues)  FIM - Control and instrumentation engineer Devices: Walker;Arm rests Bed/Chair Transfer: 5: Chair or W/C > Bed: Supervision (verbal cues/safety issues);5: Bed > Chair or W/C: Supervision (verbal cues/safety issues);6: Sit > Supine: No assist;6: Supine > Sit: No assist  FIM - Locomotion: Wheelchair Distance: 30 Locomotion: Wheelchair: 1: Total Assistance/staff pushes wheelchair (Pt<25%) FIM - Locomotion: Ambulation Locomotion: Ambulation Assistive Devices: Administrator Ambulation/Gait Assistance: 5: Supervision Locomotion: Ambulation: 5: Travels 150 ft or more with supervision/safety  issues  Comprehension Comprehension Mode: Auditory Comprehension: 4-Understands basic 75 - 89% of the time/requires cueing 10 - 24% of the time  Expression  Expression Mode: Verbal Expression: 4-Expresses basic 75 - 89% of the time/requires cueing 10 - 24% of the time. Needs helper to occlude trach/needs to repeat words.  Social Interaction Social Interaction: 5-Interacts appropriately 90% of the time - Needs monitoring or encouragement for participation or interaction.  Problem Solving Problem Solving: 2-Solves basic 25 - 49% of the time - needs direction more than half the time to initiate, plan or complete simple activities  Memory Memory: 2-Recognizes or recalls 25 - 49% of the time/requires cueing 51 - 75% of the time  Medical Problem List and Plan:  1. Functional deficits secondary to right parietal lobe infarct as well as history of CVA May of 2015  2. DVT Prophylaxis/Anticoagulation: SCDs. Monitor for any signs of DVT  3. Pain Management: Tylenol as needed  4. Mood/dementia: Patient appears at baseline  5. Neuropsych: This patient is not capable of making decisions on her own behalf.  6. Skin/Wound Care: Routine skin checks  7. GI bleed/AV malformation. Followup gastroscopy services. Monitor CBC. Continue Protonix daily  8. Diabetes mellitus with peripheral neuropathy. Hemoglobin A1c 7.0. Lantus insulin 8 units qhs   -more elevated in evening---added am lantus 5u---titrate further as needed 9. Hypertension.Reduce Toprol-XL 12.5 mg daily due to Bradycardia,Imdur 60 mg daily. increase norvasc to 10mg   -may need further titration reduce to <140/90 10. Hyperlipidemia. Lipitor  11. Chronic renal insufficiency. Baseline creatinine 5.17.   12. Diastolic congestive heart failure. Monitor for any signs of fluid overload  13. Low grade temp  -IS  -E coli UTI---cipro for 7 days  LOS (Days) 10 A FACE TO FACE EVALUATION WAS PERFORMED  Alysia Penna E 05/25/2014 9:25 AM

## 2014-05-26 NOTE — Progress Notes (Signed)
Social Work  Discharge Note  The overall goal for the admission was met for:   Discharge location: Yes - home with family to provide 24/7 assistance  Length of Stay: Yes - 10 days  Discharge activity level: Yes - supervision to minimal assistance  Home/community participation: Yes  Services provided included: MD, RD, PT, OT, SLP, RN, TR, Pharmacy and [place]: Medicare and Medicaid  Follow-up services arranged: Home Health: RN, PT, OT, ST via [place], DME: 3n1 commode via [place] and Patient/Family has no preference for HH/DME agencies  Comments (or additional information):  Patient/Family verbalized understanding of follow-up arrangements: Yes  Individual responsible for coordination of the follow-up plan: patient  Confirmed correct DME delivered: Tyshea Imel 05/26/2014    Tijana Walder

## 2014-05-29 ENCOUNTER — Telehealth: Payer: Self-pay

## 2014-05-29 NOTE — Telephone Encounter (Signed)
Wes (PT @ [facility]) is requesting a verbal order for PT 2 x a week for 3 weeks. Left a message on a verified voicemail giving a verbal okay.

## 2014-06-05 ENCOUNTER — Ambulatory Visit: Payer: Self-pay | Admitting: Nurse Practitioner

## 2014-06-06 ENCOUNTER — Encounter: Payer: Self-pay | Admitting: Nurse Practitioner

## 2014-06-06 ENCOUNTER — Ambulatory Visit (INDEPENDENT_AMBULATORY_CARE_PROVIDER_SITE_OTHER): Payer: Medicare Other | Admitting: Nurse Practitioner

## 2014-06-06 VITALS — BP 156/59 | HR 64 | Ht 61.0 in | Wt 151.0 lb

## 2014-06-06 DIAGNOSIS — I6340 Cerebral infarction due to embolism of unspecified cerebral artery: Secondary | ICD-10-CM

## 2014-06-06 DIAGNOSIS — I6350 Cerebral infarction due to unspecified occlusion or stenosis of unspecified cerebral artery: Secondary | ICD-10-CM

## 2014-06-06 DIAGNOSIS — I639 Cerebral infarction, unspecified: Secondary | ICD-10-CM

## 2014-06-06 NOTE — Progress Notes (Signed)
PATIENT: [name] DOB: Jan 21, 1933  REASON FOR VISIT: hospital follow up for stroke HISTORY FROM: patient, son  HISTORY OF PRESENT ILLNESS: [name] is an 80 y.o. Female who comes to the office for first hospital follow up post hospital discharge for stroke. She has actually had 2 separate admissions for stroke this year.  She had hospitalization from 02/04/2014 to 02/10/2014, for an acute stroke, possible watershed infarct versus embolic strokes. She has history of atrial fibrillation (recent event monitors did not show A. Fib it did show some PVCs and short bursts of nonsustained ventricular tachycardia.) recent NSTEMI with outpatient evaluation by cardiology during the stress test done on 12/25/9456, combined systolic and diastolic heart failure (ejection fraction of 59-29%, grade 2 diastolic dysfunction on 2/44/6286). She has been on anticoagulation in past but was taken off this due to GI bleed. Since then she has been on ASA.   Over the two days prior to 05/09/14 daughter and husband has noted she has become very tired, weak and SOB. She was brought to ED 05/09/2014 due to her SOB. Hgb was noted to be 4.7 and HCT of 15.8. FOBT (+). MRI was obtained while in hospital which showed 2 right parietal infarcts. History was obtained from family as patient is very lethargic and not vocalizing. Unable to determine last known well. She was admitted for further evaluation and treatment. MRI of the brain shows two areas of acute infarct in the right parietal lobe. Moderate chronic microvascular ischemic change with diffuse atrophy. Carotid Doppler shows no evidence of hemodynamically significant internal carotid artery stenosis. 2D Echocardiogram with EF 40-50% with no source of embolus.  She was admitted to inpatient rehab and discharged to home with home health services which are continuing presently. Hgb A1c was 11.2 in the hospital but has recently come down to 7.0. Son states she is slowly  getting her strength back, he and his siblings are helping with transportation to appointments. She had some confusion and sundowning in the hospital in the setting of UTI, has since resolved per son.  REVIEW OF SYSTEMS: Full 14 system review of systems performed and notable only for: cough.  ALLERGIES: Allergies  Allergen Reactions  . Baking Soda-Fluoride [Sodium Fluoride] Nausea And Vomiting  . Magnesium Hydroxide Nausea And Vomiting    HOME MEDICATIONS: Outpatient Prescriptions Prior to Visit  Medication Sig Dispense Refill  . acetaminophen (TYLENOL) 500 MG tablet Take 500 mg by mouth every 6 (six) hours as needed for moderate pain or headache.      . albuterol (PROAIR HFA) 108 (90 BASE) MCG/ACT inhaler Inhale 2 puffs into the lungs every 6 (six) hours as needed for wheezing.  8.5 g  11  . amLODipine (NORVASC) 5 MG tablet Take 1 tablet (5 mg total) by mouth daily.  30 tablet  1  . aspirin 81 MG tablet Take 1 tablet (81 mg total) by mouth daily.  30 tablet    . atorvastatin (LIPITOR) 40 MG tablet Take 1 tablet (40 mg total) by mouth daily at 6 PM.  30 tablet  11  . ferrous sulfate 325 (65 FE) MG tablet Take 1 tablet (325 mg total) by mouth 2 (two) times daily with a meal.  60 tablet  5  . insulin glargine (LANTUS) 100 UNIT/ML injection Inject 0.08 mLs (8 Units total) into the skin at bedtime.  10 mL  11  . insulin glargine (LANTUS) 100 UNIT/ML injection 5 units in the a.m. and 8 units  each bedtime  10 mL  11  . isosorbide mononitrate (IMDUR) 60 MG 24 hr tablet Take 1 tablet (60 mg total) by mouth daily.  30 tablet  2  . loratadine (ALLERGY RELIEF) 10 MG tablet Take 10 mg by mouth daily.      . magnesium oxide (MAG-OX) 400 (241.3 MG) MG tablet Take 1 tablet (400 mg total) by mouth 2 (two) times daily.  60 tablet  1  . metoprolol succinate (TOPROL-XL) 12.5 mg TB24 24 hr tablet Take 0.5 tablets (12.5 mg total) by mouth daily.  30 tablet  1  . Multiple Vitamins-Minerals (ALIVE WOMENS 50+ PO)  Take 1 tablet by mouth daily.      Marland Kitchen omeprazole (PRILOSEC) 20 MG capsule Take 1 capsule (20 mg total) by mouth daily before breakfast.  30 capsule  11  . polyethylene glycol (MIRALAX / GLYCOLAX) packet Take 17 g by mouth daily.       No facility-administered medications prior to visit.    PHYSICAL EXAM Filed Vitals:   06/06/14 0954  BP: 156/59  Pulse: 64  Height: 5\' 1"  (1.549 m)  Weight: 151 lb (68.493 kg)   Body mass index is 28.55 kg/(m^2).  Visual Acuity Screening   Right eye Left eye Both eyes  Without correction: 20/200 20/200   With correction:      Generalized: Frail elderly AA female in no acute distress  Head: normocephalic and atraumatic. Oropharynx benign  Neck: Supple, no carotid bruits  Cardiac: Regular rate rhythm, 2/6 holosystolic murmur  Musculoskeletal: mid kyphosis  Neurological examination  Mentation: Alert oriented to time, place. Follows simple commands, speech is hypophonic with some expressive delay. Cranial nerve II-XII: Fundoscopic exam not done. Pupils were equal round reactive to light extraocular movements were full, visual field were full on confrontational test. Facial sensation and strength were normal. hearing was intact to finger rubbing bilaterally. Uvula tongue midline. Head turning and shoulder shrug and were normal and symmetric.Tongue protrusion into cheek strength was normal. Motor: The motor testing reveals 5 over 5 strength of all 4 extremities. Good symmetric motor tone is noted throughout. Mild diminished fine finger movements on left. Orbits right over left upper extremity. Mild left grip weak. Sensory: Sensory testing is intact to soft touch on all 4 extremities. No evidence of extinction is noted.  Coordination: Cerebellar testing reveals good finger-nose-finger and heel-to-shin bilaterally.  Gait and station:  Able to rise from chair independently, pushing off from armrest with one hand. Gait is slow and careful, using a rolling walker  for ambulation. Romberg is negative. Reflexes: Deep tendon reflexes are symmetric and 1+ bilaterally.  NIHSS: 1 mRs: 3  DIAGNOSTIC DATA (LABS, IMAGING, TESTING) - I reviewed patient records, labs, notes, testing and imaging myself where available.  Lab Results  Component Value Date   WBC 7.8 05/22/2014   HGB 8.2* 05/22/2014   HCT 26.5* 05/22/2014   MCV 82.8 05/22/2014   PLT 303 05/22/2014      Component Value Date/Time   NA 140 05/22/2014 0557   K 4.5 05/22/2014 0557   CL 106 05/22/2014 0557   CO2 21 05/22/2014 0557   GLUCOSE 164* 05/22/2014 0557   BUN 29* 05/22/2014 0557   CREATININE 1.66* 05/22/2014 0557   CREATININE 2.72* 03/24/2014 1641   CALCIUM 8.7 05/22/2014 0557   PROT 6.3 05/16/2014 0755   ALBUMIN 2.1* 05/16/2014 0755   AST 19 05/16/2014 0755   ALT 42* 05/16/2014 0755   ALKPHOS 110 05/16/2014 0755  BILITOT 0.8 05/16/2014 0755   GFRNONAA 28* 05/22/2014 0557   GFRNONAA 39* 09/03/2011 1404   GFRAA 33* 05/22/2014 0557   GFRAA 45* 09/03/2011 1404   Lab Results  Component Value Date   CHOL 130 02/09/2014   HDL 60 02/09/2014   LDLCALC 58 02/09/2014   TRIG 61 02/09/2014   CHOLHDL 2.2 02/09/2014   Lab Results  Component Value Date   HGBA1C 7.0* 05/11/2014   Lab Results  Component Value Date   OACZYSAY30 1601* 05/15/2014    ASSESSMENT: [name] is a 80 y.o. AA female who has history of embolic stroke in April 0932 who presented to the ED with SOB, weakness and lethargy on 05/10/14. She was found to be anemic with a hemoglobin of 4.7, with her baseline around 8. MRI imaging during workup confirmed 2 acute right parietal infarcts. Infarcts felt to be embolic secondary to known atrial fibrillation.   PLAN: I had a long discussion with the patient and son regarding her recent stroke multiple medical problems, discussed results of evaluation in the hospital and answered questions. Continue home health therapies. Continue aspirin 81 mg orally every day for secondary stroke prevention.  Maintain strict control of hypertension with blood pressure goal below 140/90, diabetes with hemoglobin A1c goal below 7% and lipids with LDL cholesterol goal below 70 mg/dL. Followup in the future with Dr. Erlinda Hong in 4 months, sooner as needed.  Rudi Rummage Ladine Kiper, MSN, FNP-BC, A/GNP-C 06/06/2014, 10:02 AM [facility] [address], [place], [place] [address] [phone]  Note: This document was prepared with digital dictation and possible smart phrase technology. Any transcriptional errors that result from this process are unintentional.

## 2014-06-06 NOTE — Progress Notes (Signed)
I agree with the above plan 

## 2014-06-06 NOTE — Patient Instructions (Signed)
Continue aspirin 81 mg orally every day for secondary stroke prevention. Maintain strict control of hypertension with blood pressure goal below 140/90, diabetes with hemoglobin A1c goal below 7% and lipids with LDL cholesterol goal below 70 mg/dL. Followup in the future with Dr. Erlinda Hong in 4 months, sooner as needed.   Stroke Prevention Some medical conditions and behaviors are associated with an increased chance of having a stroke. You may prevent a stroke by making healthy choices and managing medical conditions. HOW CAN I REDUCE MY RISK OF HAVING A STROKE?   Stay physically active. Get at least 30 minutes of activity on most or all days.  Do not smoke. It may also be helpful to avoid exposure to secondhand smoke.  Limit alcohol use. Moderate alcohol use is considered to be:  No more than 2 drinks per day for men.  No more than 1 drink per day for nonpregnant women.  Eat healthy foods. This involves:  Eating 5 or more servings of fruits and vegetables a day.  Making dietary changes that address high blood pressure (hypertension), high cholesterol, diabetes, or obesity.  Manage your cholesterol levels.  Making food choices that are high in fiber and low in saturated fat, trans fat, and cholesterol may control cholesterol levels.  Take any prescribed medicines to control cholesterol as directed by your health care provider.  Manage your diabetes.  Controlling your carbohydrate and sugar intake is recommended to manage diabetes.  Take any prescribed medicines to control diabetes as directed by your health care provider.  Control your hypertension.  Making food choices that are low in salt (sodium), saturated fat, trans fat, and cholesterol is recommended to manage hypertension.  Take any prescribed medicines to control hypertension as directed by your health care provider.  Maintain a healthy weight.  Reducing calorie intake and making food choices that are low in sodium,  saturated fat, trans fat, and cholesterol are recommended to manage weight.  Stop drug abuse.  Avoid taking birth control pills.  Talk to your health care provider about the risks of taking birth control pills if you are over 67 years old, smoke, get migraines, or have ever had a blood clot.  Get evaluated for sleep disorders (sleep apnea).  Talk to your health care provider about getting a sleep evaluation if you snore a lot or have excessive sleepiness.  Take medicines only as directed by your health care provider.  For some people, aspirin or blood thinners (anticoagulants) are helpful in reducing the risk of forming abnormal blood clots that can lead to stroke. If you have the irregular heart rhythm of atrial fibrillation, you should be on a blood thinner unless there is a good reason you cannot take them.  Understand all your medicine instructions.  Make sure that other conditions (such as anemia or atherosclerosis) are addressed. SEEK IMMEDIATE MEDICAL CARE IF:   You have sudden weakness or numbness of the face, arm, or leg, especially on one side of the body.  Your face or eyelid droops to one side.  You have sudden confusion.  You have trouble speaking (aphasia) or understanding.  You have sudden trouble seeing in one or both eyes.  You have sudden trouble walking.  You have dizziness.  You have a loss of balance or coordination.  You have a sudden, severe headache with no known cause.  You have new chest pain or an irregular heartbeat. Any of these symptoms may represent a serious problem that is an emergency. Do not  wait to see if the symptoms will go away. Get medical help at once. Call your local emergency services (911 in [place] Do not drive yourself to the hospital. Document Released: 11/06/2004 Document Revised: 02/13/2014 Document Reviewed: 04/01/2013 [facility] Patient Information 2015 [place], [place]. This information is not intended to replace advice given  to you by your health care provider. Make sure you discuss any questions you have with your health care provider.

## 2014-06-23 DIAGNOSIS — E1149 Type 2 diabetes mellitus with other diabetic neurological complication: Secondary | ICD-10-CM

## 2014-06-23 DIAGNOSIS — I2510 Atherosclerotic heart disease of native coronary artery without angina pectoris: Secondary | ICD-10-CM

## 2014-06-23 DIAGNOSIS — I509 Heart failure, unspecified: Secondary | ICD-10-CM

## 2014-06-23 DIAGNOSIS — N189 Chronic kidney disease, unspecified: Secondary | ICD-10-CM

## 2014-06-23 DIAGNOSIS — I69959 Hemiplegia and hemiparesis following unspecified cerebrovascular disease affecting unspecified side: Secondary | ICD-10-CM

## 2014-07-04 ENCOUNTER — Ambulatory Visit: Payer: Self-pay | Admitting: Cardiovascular Disease

## 2014-07-07 ENCOUNTER — Encounter: Payer: Medicare Other | Attending: Physical Medicine & Rehabilitation

## 2014-07-07 ENCOUNTER — Encounter: Payer: Self-pay | Admitting: Physical Medicine & Rehabilitation

## 2014-07-07 ENCOUNTER — Ambulatory Visit (HOSPITAL_BASED_OUTPATIENT_CLINIC_OR_DEPARTMENT_OTHER): Payer: Medicare Other | Admitting: Physical Medicine & Rehabilitation

## 2014-07-07 VITALS — BP 157/74 | HR 73 | Resp 14 | Wt 154.0 lb

## 2014-07-07 DIAGNOSIS — I6340 Cerebral infarction due to embolism of unspecified cerebral artery: Secondary | ICD-10-CM

## 2014-07-07 DIAGNOSIS — I69993 Ataxia following unspecified cerebrovascular disease: Secondary | ICD-10-CM | POA: Diagnosis present

## 2014-07-07 NOTE — Patient Instructions (Signed)
As needed followup  If  you want outpatient occupational therapy please give my office to call and we can place an order for this

## 2014-07-07 NOTE — Progress Notes (Signed)
Subjective:    Patient ID: [name], female    DOB: 1932/11/30, 80 y.o.   MRN: [id number]  HPI This is a 80 year old right-handed female with history of coronary artery disease, diabetes mellitus, peripheral neuropathy, diastolic congestive heart failure, chronic renal insufficiency, as well as GI bleed and early dementia.  The patient well known to Rehab Services from [facility] in May 2015 with admit date Feb 10, 2014 to Feb 20, 2014.  She was discharged to home.  Required supervision for transfers and all mobility.  She had recently been placed on aspirin for both CVA prophylaxis standpoint cardiac history.  Presented May 10, 2014, with diffuse weakness and shortness of breath.  CBC showed a hemoglobin of 4.7.  Gastroenterology Service was consulted and colonoscopy completed on May 12, 2014, showing a normal colon. Underwent enteroscopy in May 13, 2014, with AV malformation found on the proximal jejunum.  The remainder of the small bowel otherwise normal.  She was transfused and latest hemoglobin 9.1.  An MRI of the head completed due to the lethargy possible right-sided weakness showing 2 areas of acute infarction in the right parietal lobe.  Placed on aspirin therapy for CVA prophylaxis.   2nd rehab admission DATE OF ADMISSION:  05/15/2014 DATE OF DISCHARGE:  05/25/2014  Has finished with home health PT and OT. Patient is independent with dressing and bathing. Modified independent with walker for short distances. Son lives with the patient. He does cooking as well as housework  Has been seen by neurology. Maintained on aspirin for stroke prophylaxis.  Has as needed appointment with gastroenterologist  Will see Dr. Iona Beard on Monday, primary care physician   Pain Inventory Average Pain 2 Pain Right Now 2 My pain is intermittent  In the last 24 hours, has pain interfered with the following? General activity 2 Relation with others 2 Enjoyment of life 2 What TIME  of day is your pain at its worst? daytime Sleep (in general) Fair  Pain is worse with: walking and standing Pain improves with: rest Relief from Meds: no med  Mobility use a walker use a wheelchair needs help with transfers  Function retired I need assistance with the following:  meal prep, household duties and shopping  Neuro/Psych No problems in this area  Prior Studies Any changes since last visit?  no  Physicians involved in your care Any changes since last visit?  no   Family History  Problem Relation Age of Onset  . Diabetes insipidus Mother   . Hypertension Mother   . Hypertension Father   . Hypertension Sister   . Hypertension Child   . Stomach cancer Brother    History   Social History  . Marital Status: Widowed    Spouse Name: N/A    Number of Children: 7  . Years of Education: N/A   Occupational History  . Disabled    Social History Main Topics  . Smoking status: Never Smoker   . Smokeless tobacco: Former Systems developer    Types: Snuff    Quit date: 10/13/2006     Comment: 02/26/12 "stopped snuff 4-6 years ago"  . Alcohol Use: No     Comment: "stopped drinking alcohol ~ 1980's"  . Drug Use: No  . Sexual Activity: No   Other Topics Concern  . None   Social History Narrative   ** Merged History Encounter **       Lives with daughter   Past Surgical History  Procedure Laterality Date  .  Esophagogastroduodenoscopy  08/26/2011    Procedure: ESOPHAGOGASTRODUODENOSCOPY (EGD);  Surgeon: [name], MD;  Location: [facility] ENDOSCOPY;  Service: Endoscopy;  Laterality: N/A;  . Colonoscopy  08/28/2011    Procedure: COLONOSCOPY;  Surgeon: [name], MD;  Location: [place];  Service: Endoscopy;  Laterality: N/A;  . Vaginal hysterectomy    . Tubal ligation    . Cataract extraction w/ intraocular lens  implant, bilateral    . Toe surgery      "right big toe; operated on it to straighten it out; it was under"  . Polysomnogram  10/17/2005     AHI-7.28/hr. AHI REM-20.8/hr. Average oxygen saturation range during REM and NREM was 97%. Lowest oxygen saturation during REM sleep was 90%.  . Carotid duplex  05/28/2010    No significant extracranial carotid artery stenosis demonstrated. Vertebrals are patent w/ antegrade flow.  . Cardiac catheterization  05/24/2010    No intervention - recommed medical therapy.  . Cardiovascular stress test  08/07/2006    Moderate-severe defect seen in Basal inferior, Mid inferoseptal, Mid inferior, Mid inferolateral, and Apical inferior regions - consistent w/ infarct/scar. No scintigraphic evidence of inducible myocardial ischemia.  . Transthoracic echocardiogram  08/29/2011    EF 55-60%, moderate LVH,   . Esophagogastroduodenoscopy N/A 02/08/2014    Procedure: ESOPHAGOGASTRODUODENOSCOPY (EGD);  Surgeon: [name], MD;  Location: [facility] ENDOSCOPY;  Service: Endoscopy;  Laterality: N/A;  . Colonoscopy N/A 05/12/2014    Procedure: COLONOSCOPY;  Surgeon: [name], MD;  Location: [place];  Service: Endoscopy;  Laterality: N/A;  . Enteroscopy N/A 05/13/2014    Procedure: ENTEROSCOPY;  Surgeon: [name], MD;  Location: [place];  Service: Endoscopy;  Laterality: N/A;   Past Medical History  Diagnosis Date  . Asthma   . CAD (coronary artery disease)     Pt reports MI in 2006 (no documentation).  Cardiolite in 05/2002 and 07/2006 did not reveal any reversible ischemia.  Pt follows with [facility] Rex [facility] at [facility].  . CHF (congestive heart failure)     EF 25-30% with dilated LV, mild LVH, severe hypokinesis, and mod-severe reduction in RV function  . Osteoporosis   . HYPERTENSION 08/03/2006  . GASTROPARESIS, DIABETIC 08/03/2006  . HYPERLIPIDEMIA 08/03/2006  . OBSTRUCTIVE SLEEP APNEA 01/06/2008  . PERIPHERAL NEUROPATHY 08/03/2006  . GERD 08/03/2006  . LOW BACK PAIN, CHRONIC 08/03/2006  . OSTEOPOROSIS 03/21/2009  . CEREBRAL EMBOLISM, WITH INFARCTION 07/02/2010  . Angina   . Myocardial infarction "2 or  3"  . Pneumonia 02/26/12    "a few times; probably even today"  . Shortness of breath     "all the time"  . DIABETES MELLITUS, TYPE II 11/04/1983  . Blood transfusion 08/2011  . Lower GI bleeding 08/2011 and 01/2014  . Chronic daily headache   . Migraines   . Stroke summer 2011    "made my left hip worse"  . Uterine cancer   . Nonischemic cardiomyopathy 05/2010    Left heart catheterization:2011. Nonobstructive coronary artery disease.  . Pulmonary hypertension   . Hypertension   . NSTEMI (non-ST elevated myocardial infarction) 01/2014    type II with CVA  . Acute embolic stroke 6/73/4193  . Non-ST elevation MI (NSTEMI) 02/04/2014  . E. coli UTI 03/02/2014  . Renal failure, acute 02/04/2014  . Iron deficiency anemia secondary to blood loss (chronic) 01/06/2008    Qualifier: Diagnosis of  By: Tomasa Hosteller MD, Veronique D.   . Nonsustained ventricular tachycardia   . Angiodysplasia  of intestine with hemorrhage 05/13/2014   BP 157/74  Pulse 73  Resp 14  Wt 154 lb (69.854 kg)  SpO2 96%  LMP 12/19/1968  Opioid Risk Score:   Fall Risk Score: High Fall Risk (>13 points) (educated and given handout on fall prevention in the home)  Review of Systems  Respiratory: Positive for shortness of breath.   All other systems reviewed and are negative.      Objective:   Physical Exam  Nursing note and vitals reviewed. Constitutional: She appears well-developed and well-nourished.  HENT:  Head: Normocephalic and atraumatic.  Eyes: Conjunctivae and EOM are normal. Pupils are equal, round, and reactive to light.  Psychiatric: She has a normal mood and affect.  oriented to self, day, not date, at MD office in [place],  [place] from [facility] Positive dysdiadochokinesis on rapid alternating pronation supination of left upper extremity. Decreased finger to thumb opposition left hand. Motor strength 4/5 bilateral deltoid, bicep, tricep, grip, hip flexor, knee extensors, ankle dorsiflexor plantar  flexor Sensory intact to light touch bilateral upper and lower limbs.        Assessment & Plan:  1. Recurrent CVA last infarct in the right parietal area to locations. Still has residual left fine motor deficits. We discussed in out patient OT referral may be helpful for improving hand coordination but it this is not bothering her a lot we could hold off on this.  Patient was walking with a walker prior to her stroke so her current mobility status is not much different than her baseline status.

## 2014-07-11 ENCOUNTER — Inpatient Hospital Stay (HOSPITAL_COMMUNITY)
Admission: EM | Admit: 2014-07-11 | Discharge: 2014-07-15 | DRG: 344 | Disposition: A | Discharge status: Home Health | Payer: Medicare Other | Attending: Internal Medicine | Admitting: Internal Medicine

## 2014-07-11 ENCOUNTER — Encounter (HOSPITAL_COMMUNITY): Payer: Self-pay | Admitting: Emergency Medicine

## 2014-07-11 ENCOUNTER — Emergency Department (HOSPITAL_COMMUNITY): Payer: Medicare Other

## 2014-07-11 DIAGNOSIS — E1142 Type 2 diabetes mellitus with diabetic polyneuropathy: Secondary | ICD-10-CM | POA: Diagnosis present

## 2014-07-11 DIAGNOSIS — E1165 Type 2 diabetes mellitus with hyperglycemia: Secondary | ICD-10-CM | POA: Diagnosis present

## 2014-07-11 DIAGNOSIS — G9341 Metabolic encephalopathy: Secondary | ICD-10-CM | POA: Diagnosis not present

## 2014-07-11 DIAGNOSIS — N179 Acute kidney failure, unspecified: Secondary | ICD-10-CM | POA: Diagnosis not present

## 2014-07-11 DIAGNOSIS — E1143 Type 2 diabetes mellitus with diabetic autonomic (poly)neuropathy: Secondary | ICD-10-CM | POA: Diagnosis present

## 2014-07-11 DIAGNOSIS — K922 Gastrointestinal hemorrhage, unspecified: Secondary | ICD-10-CM

## 2014-07-11 DIAGNOSIS — M545 Low back pain: Secondary | ICD-10-CM | POA: Diagnosis present

## 2014-07-11 DIAGNOSIS — Z66 Do not resuscitate: Secondary | ICD-10-CM | POA: Diagnosis present

## 2014-07-11 DIAGNOSIS — K219 Gastro-esophageal reflux disease without esophagitis: Secondary | ICD-10-CM | POA: Diagnosis present

## 2014-07-11 DIAGNOSIS — K921 Melena: Secondary | ICD-10-CM

## 2014-07-11 DIAGNOSIS — I5043 Acute on chronic combined systolic (congestive) and diastolic (congestive) heart failure: Secondary | ICD-10-CM | POA: Diagnosis present

## 2014-07-11 DIAGNOSIS — Z7982 Long term (current) use of aspirin: Secondary | ICD-10-CM

## 2014-07-11 DIAGNOSIS — M810 Age-related osteoporosis without current pathological fracture: Secondary | ICD-10-CM | POA: Diagnosis present

## 2014-07-11 DIAGNOSIS — E785 Hyperlipidemia, unspecified: Secondary | ICD-10-CM | POA: Diagnosis present

## 2014-07-11 DIAGNOSIS — I252 Old myocardial infarction: Secondary | ICD-10-CM

## 2014-07-11 DIAGNOSIS — G8929 Other chronic pain: Secondary | ICD-10-CM | POA: Diagnosis present

## 2014-07-11 DIAGNOSIS — I69354 Hemiplegia and hemiparesis following cerebral infarction affecting left non-dominant side: Secondary | ICD-10-CM

## 2014-07-11 DIAGNOSIS — G4733 Obstructive sleep apnea (adult) (pediatric): Secondary | ICD-10-CM | POA: Diagnosis present

## 2014-07-11 DIAGNOSIS — Z8542 Personal history of malignant neoplasm of other parts of uterus: Secondary | ICD-10-CM

## 2014-07-11 DIAGNOSIS — K3184 Gastroparesis: Secondary | ICD-10-CM | POA: Diagnosis present

## 2014-07-11 DIAGNOSIS — E11649 Type 2 diabetes mellitus with hypoglycemia without coma: Secondary | ICD-10-CM | POA: Diagnosis present

## 2014-07-11 DIAGNOSIS — I2510 Atherosclerotic heart disease of native coronary artery without angina pectoris: Secondary | ICD-10-CM | POA: Diagnosis present

## 2014-07-11 DIAGNOSIS — R404 Transient alteration of awareness: Secondary | ICD-10-CM

## 2014-07-11 DIAGNOSIS — I4891 Unspecified atrial fibrillation: Secondary | ICD-10-CM | POA: Diagnosis present

## 2014-07-11 DIAGNOSIS — Z23 Encounter for immunization: Secondary | ICD-10-CM | POA: Diagnosis not present

## 2014-07-11 DIAGNOSIS — F0390 Unspecified dementia without behavioral disturbance: Secondary | ICD-10-CM | POA: Diagnosis present

## 2014-07-11 DIAGNOSIS — Z889 Allergy status to unspecified drugs, medicaments and biological substances status: Secondary | ICD-10-CM | POA: Diagnosis not present

## 2014-07-11 DIAGNOSIS — I5032 Chronic diastolic (congestive) heart failure: Secondary | ICD-10-CM

## 2014-07-11 DIAGNOSIS — I129 Hypertensive chronic kidney disease with stage 1 through stage 4 chronic kidney disease, or unspecified chronic kidney disease: Secondary | ICD-10-CM | POA: Diagnosis present

## 2014-07-11 DIAGNOSIS — I509 Heart failure, unspecified: Secondary | ICD-10-CM

## 2014-07-11 DIAGNOSIS — Z800 Family history of malignant neoplasm of digestive organs: Secondary | ICD-10-CM

## 2014-07-11 DIAGNOSIS — Z87891 Personal history of nicotine dependence: Secondary | ICD-10-CM

## 2014-07-11 DIAGNOSIS — G629 Polyneuropathy, unspecified: Secondary | ICD-10-CM | POA: Diagnosis present

## 2014-07-11 DIAGNOSIS — J45909 Unspecified asthma, uncomplicated: Secondary | ICD-10-CM | POA: Diagnosis present

## 2014-07-11 DIAGNOSIS — D62 Acute posthemorrhagic anemia: Secondary | ICD-10-CM | POA: Diagnosis present

## 2014-07-11 DIAGNOSIS — R0602 Shortness of breath: Secondary | ICD-10-CM | POA: Diagnosis present

## 2014-07-11 DIAGNOSIS — D500 Iron deficiency anemia secondary to blood loss (chronic): Secondary | ICD-10-CM

## 2014-07-11 DIAGNOSIS — Z8249 Family history of ischemic heart disease and other diseases of the circulatory system: Secondary | ICD-10-CM

## 2014-07-11 DIAGNOSIS — G9340 Encephalopathy, unspecified: Secondary | ICD-10-CM | POA: Diagnosis not present

## 2014-07-11 DIAGNOSIS — R4182 Altered mental status, unspecified: Secondary | ICD-10-CM

## 2014-07-11 DIAGNOSIS — I428 Other cardiomyopathies: Secondary | ICD-10-CM | POA: Diagnosis present

## 2014-07-11 DIAGNOSIS — N184 Chronic kidney disease, stage 4 (severe): Secondary | ICD-10-CM | POA: Diagnosis present

## 2014-07-11 DIAGNOSIS — D649 Anemia, unspecified: Secondary | ICD-10-CM

## 2014-07-11 DIAGNOSIS — Z794 Long term (current) use of insulin: Secondary | ICD-10-CM | POA: Diagnosis not present

## 2014-07-11 DIAGNOSIS — K5521 Angiodysplasia of colon with hemorrhage: Principal | ICD-10-CM

## 2014-07-11 DIAGNOSIS — IMO0002 Reserved for concepts with insufficient information to code with codable children: Secondary | ICD-10-CM

## 2014-07-11 LAB — CBC WITH DIFFERENTIAL/PLATELET
Basophils Absolute: 0 10*3/uL (ref 0.0–0.1)
Basophils Relative: 1 % (ref 0–1)
EOS PCT: 1 % (ref 0–5)
Eosinophils Absolute: 0.1 10*3/uL (ref 0.0–0.7)
HEMATOCRIT: 23.6 % — AB (ref 36.0–46.0)
HEMOGLOBIN: 7.5 g/dL — AB (ref 12.0–15.0)
LYMPHS PCT: 23 % (ref 12–46)
Lymphs Abs: 1.5 10*3/uL (ref 0.7–4.0)
MCH: 27.2 pg (ref 26.0–34.0)
MCHC: 31.8 g/dL (ref 30.0–36.0)
MCV: 85.5 fL (ref 78.0–100.0)
MONO ABS: 0.3 10*3/uL (ref 0.1–1.0)
MONOS PCT: 4 % (ref 3–12)
Neutro Abs: 4.7 10*3/uL (ref 1.7–7.7)
Neutrophils Relative %: 71 % (ref 43–77)
Platelets: 242 10*3/uL (ref 150–400)
RBC: 2.76 MIL/uL — AB (ref 3.87–5.11)
RDW: 16.5 % — ABNORMAL HIGH (ref 11.5–15.5)
WBC: 6.5 10*3/uL (ref 4.0–10.5)

## 2014-07-11 LAB — PROTIME-INR
INR: 1.24 (ref 0.00–1.49)
Prothrombin Time: 15.6 seconds — ABNORMAL HIGH (ref 11.6–15.2)

## 2014-07-11 LAB — BASIC METABOLIC PANEL
ANION GAP: 14 (ref 5–15)
BUN: 46 mg/dL — ABNORMAL HIGH (ref 6–23)
CO2: 21 mEq/L (ref 19–32)
CREATININE: 1.78 mg/dL — AB (ref 0.50–1.10)
Calcium: 9.3 mg/dL (ref 8.4–10.5)
Chloride: 102 mEq/L (ref 96–112)
GFR, EST AFRICAN AMERICAN: 30 mL/min — AB (ref >90)
GFR, EST NON AFRICAN AMERICAN: 26 mL/min — AB (ref >90)
Glucose, Bld: 223 mg/dL — ABNORMAL HIGH (ref 70–99)
Potassium: 4.5 mEq/L (ref 3.7–5.3)
Sodium: 137 mEq/L (ref 137–147)

## 2014-07-11 LAB — POC OCCULT BLOOD, ED: Fecal Occult Bld: POSITIVE — AB

## 2014-07-11 LAB — TROPONIN I: Troponin I: <0.3 ng/mL (ref <0.30)

## 2014-07-11 LAB — I-STAT TROPONIN, ED: Troponin i, poc: 0.02 ng/mL (ref 0.00–0.08)

## 2014-07-11 LAB — PRO B NATRIURETIC PEPTIDE: Pro B Natriuretic peptide (BNP): 6841 pg/mL — ABNORMAL HIGH (ref 0–450)

## 2014-07-11 MED ORDER — FUROSEMIDE 10 MG/ML IJ SOLN
40.0000 mg | Freq: Once | INTRAMUSCULAR | Status: AC
  Administered 2014-07-11: 40 mg via INTRAVENOUS
  Filled 2014-07-11: qty 4

## 2014-07-11 MED ORDER — SODIUM CHLORIDE 0.9 % IV SOLN
250.0000 mL | INTRAVENOUS | Status: DC | PRN
  Administered 2014-07-12: 250 mL via INTRAVENOUS

## 2014-07-11 MED ORDER — ALBUTEROL SULFATE HFA 108 (90 BASE) MCG/ACT IN AERS: 2.0000 | INHALATION_SPRAY | Freq: Four times a day (QID) | RESPIRATORY_TRACT | Status: DC | PRN

## 2014-07-11 MED ORDER — INSULIN GLARGINE 100 UNIT/ML ~~LOC~~ SOLN
5.0000 [IU] | Freq: Two times a day (BID) | SUBCUTANEOUS | Status: DC
  Administered 2014-07-11 – 2014-07-12 (×3): 5 [IU] via SUBCUTANEOUS
  Filled 2014-07-11 (×4): qty 0.05

## 2014-07-11 MED ORDER — SODIUM CHLORIDE 0.9 % IJ SOLN: 3.0000 mL | INTRAMUSCULAR | Status: DC | PRN

## 2014-07-11 MED ORDER — SODIUM CHLORIDE 0.9 % IJ SOLN
3.0000 mL | Freq: Two times a day (BID) | INTRAMUSCULAR | Status: DC
  Administered 2014-07-12: 3 mL via INTRAVENOUS

## 2014-07-11 MED ORDER — PANTOPRAZOLE SODIUM 40 MG IV SOLR
40.0000 mg | Freq: Two times a day (BID) | INTRAVENOUS | Status: DC
  Administered 2014-07-11 – 2014-07-14 (×7): 40 mg via INTRAVENOUS
  Filled 2014-07-11 (×10): qty 40

## 2014-07-11 NOTE — H&P (Addendum)
Hospitalist Admission History and Physical  Patient name: [name] Medical record number: [id number] Date of birth: 10/15/1932 Age: 80 y.o. Gender: female  Primary Care Provider: [name], MD  Chief Complaint: GIB, weakness, Acute on chronic mixed CHF exacerbation, symptomatic anemia   History of Present Illness:This is a 80 y.o. year old female with significant past medical history of multiple medical issues including recent admission 4/69-6/2 for acute embolic CVA and GIB 2/2 small bowel AVM s/p clipping and NSTEMI  presenting with weakness, GIB, AoC combined CHF exacerbation. Per the family, pt was noted to be progressively weak over the past day. Family also noticed pt with dark/marron colored stools concerning for BIB recurrence. Family/pt deny and NSAID use apart from baby ASA. No recent strenous activity. Pt states that she has some mild L sided weakness that is stable from recent CVA. No acute worsening. Pt family deny any orhopnea, PND, LE swelling. Deny any salty food intake.  On presentation to the ER, BP mildly elevated into 160s-170s. Otherwise hemodynamically stable. Hgb 7.5, Cr 1.78, BUN 46,  glu 223. CXR shows cardiomegaly. Trop neg x1. Hemoccult positive. Pro BNP @ 6841 (was 14k during last admission). Weight today pending.   Filed Weights  05/10/14 0623  05/10/14 1600  Weight:  158 lb (71.668 kg)  155 lb 13.8 oz (70.7 kg)   Filed Weights   07/12/14 0017  Weight: 67.7 kg (149 lb 4 oz)    Assessment and Plan: [name] is a 80 y.o. year old female presenting with weakness, GIB, acute on chronic mixed CHF exacerbation, symptomatic anemia, AKI   Active Problems:   GIB (gastrointestinal bleeding)  1- Weakness -likely multifactorial with contributions of mild CHF exacerbation, symptomatic anemia  -weight pending  -Hgb today 7.5-was 8.2 one month ago  -mildly volume overloaded  -may be an element of dilutional effect  -will gently diurese pt with hgb  recheck-type and cross pending  -stepdown bed  -cycle CEs  -hold NSAIDs  2-GIB  -as above  -most recent admission with small bowel AVM s/p clipping by GI 04/2014 (please see discharge summary for full details) -GI is aware of case per EDP  -HOLD baby ASA  -serial CBCs- reasess in setting of gentle diuresis  -high dose PPI  -cont to follow closely  -stepdown bed   3-Acute on chronic mixed CHF/CAD -EF 40-45% and grade 2 diastolic dysfunction on 06/5283 ECHO  -mild cardiomegaly on imaging -noted pro BNP @ 6800 (baseline seems to be aroung ?6000) -mildly volume overloaded clinically  -gently diurese-reasess weakness and volume status pending likely pRBC transfusion -mild intermittent CP today per pt- non currently  -trop neg x1 -NSR w/ LBBB on EKG  -cycle CEs -prn NTG -hold ASA given above   4-CVA -baseline L sided deficits  -pt states that she is at her baseline -hold low dose ASA given above  -continue to follow closely   5-HTN -mildly elevated BPs on presentation -suspect this is likely pt's baseline -continue to follow -Hydralazine prn   6-AKI  -Likely AoCKD in setting of GIB and CHF flare  -seems to be stage 3-4 chronically  -noted elevated BUN in setting of above  -gently diurese  -transfuse prn  -trend   7-IDDM  -lantus  -SSI  -A1C    FEN/GI: NPO. High dose PPI  Prophylaxis: SCDs  Disposition: pending further evaluation  Code Status:DNR   Patient Active Problem List   Diagnosis Date Noted  . GIB (gastrointestinal bleeding) 07/11/2014  .  Angiodysplasia of intestine with hemorrhage 05/13/2014  . Acute ischemic stroke 05/11/2014  . Anemia 05/10/2014  . GI bleed 05/10/2014  . Melena 05/10/2014  . Ataxia, late effect of cerebrovascular disease 03/23/2014  . Atrial fibrillation 03/02/2014  . CVA (cerebral infarction) 02/10/2014  . Erosive gastritis with hemorrhage 02/08/2014  . Acute bilat watershed infarction vs. embolic strokes 19/41/7408  .  Chronic diastolic CHF EF 14% 48/18/5631  . NSTEMI (non-ST elevated myocardial infarction) 02/04/2014  . Nonischemic cardiomyopathy 12/12/2013  . ARF (acute renal failure) 12/05/2013  . Memory difficulties 12/04/2013  . Pulmonary edema 12/03/2013  . Acute diastolic CHF (congestive heart failure) 12/03/2013  . Irregular heart rhythm 02/26/2012  . Ventricular bigeminy 02/26/2012  . CAD (coronary artery disease)   . CEREBRAL EMBOLISM, WITH INFARCTION 07/02/2010  . OSTEOPOROSIS 03/21/2009  . DEGENERATIVE JOINT DISEASE 07/24/2008  . INSOMNIA-SLEEP DISORDER-UNSPEC 07/24/2008  . Iron deficiency anemia secondary to blood loss (chronic) 01/06/2008  . OBSTRUCTIVE SLEEP APNEA 01/06/2008  . GASTROPARESIS, DIABETIC 08/03/2006  . HYPERLIPIDEMIA 08/03/2006  . PERIPHERAL NEUROPATHY 08/03/2006  . HYPERTENSION 08/03/2006  . Chronic diastolic congestive heart failure 08/03/2006  . REACTIVE AIRWAY DISEASE 08/03/2006  . GERD 08/03/2006  . DM (diabetes mellitus), type 2, uncontrolled 11/04/1983   Past Medical History: Past Medical History  Diagnosis Date  . Asthma   . CAD (coronary artery disease)     Pt reports MI in 2006 (no documentation).  Cardiolite in 05/2002 and 07/2006 did not reveal any reversible ischemia.  Pt follows with Dr. Rex Kras at [facility].  . CHF (congestive heart failure)     EF 25-30% with dilated LV, mild LVH, severe hypokinesis, and mod-severe reduction in RV function  . Osteoporosis   . HYPERTENSION 08/03/2006  . GASTROPARESIS, DIABETIC 08/03/2006  . HYPERLIPIDEMIA 08/03/2006  . OBSTRUCTIVE SLEEP APNEA 01/06/2008  . PERIPHERAL NEUROPATHY 08/03/2006  . GERD 08/03/2006  . LOW BACK PAIN, CHRONIC 08/03/2006  . OSTEOPOROSIS 03/21/2009  . CEREBRAL EMBOLISM, WITH INFARCTION 07/02/2010  . Angina   . Myocardial infarction "2 or 3"  . Pneumonia 02/26/12    "a few times; probably even today"  . Shortness of breath     "all the time"  . DIABETES MELLITUS, TYPE II 11/04/1983  . Blood  transfusion 08/2011  . Lower GI bleeding 08/2011 and 01/2014  . Chronic daily headache   . Migraines   . Stroke summer 2011    "made my left hip worse"  . Uterine cancer   . Nonischemic cardiomyopathy 05/2010    Left heart catheterization:2011. Nonobstructive coronary artery disease.  . Pulmonary hypertension   . Hypertension   . NSTEMI (non-ST elevated myocardial infarction) 01/2014    type II with CVA  . Acute embolic stroke 4/97/0263  . Non-ST elevation MI (NSTEMI) 02/04/2014  . E. coli UTI 03/02/2014  . Renal failure, acute 02/04/2014  . Iron deficiency anemia secondary to blood loss (chronic) 01/06/2008    Qualifier: Diagnosis of  By: Tomasa Hosteller MD, Veronique D.   . Nonsustained ventricular tachycardia   . Angiodysplasia of intestine with hemorrhage 05/13/2014    Past Surgical History: Past Surgical History  Procedure Laterality Date  . Esophagogastroduodenoscopy  08/26/2011    Procedure: ESOPHAGOGASTRODUODENOSCOPY (EGD);  Surgeon: [name], MD;  Location: [facility];  Service: Endoscopy;  Laterality: N/A;  . Colonoscopy  08/28/2011    Procedure: COLONOSCOPY;  Surgeon: [name], MD;  Location: [place];  Service: Endoscopy;  Laterality: N/A;  . Vaginal hysterectomy    .  Tubal ligation    . Cataract extraction w/ intraocular lens  implant, bilateral    . Toe surgery      "right big toe; operated on it to straighten it out; it was under"  . Polysomnogram  10/17/2005    AHI-7.28/hr. AHI REM-20.8/hr. Average oxygen saturation range during REM and NREM was 97%. Lowest oxygen saturation during REM sleep was 90%.  . Carotid duplex  05/28/2010    No significant extracranial carotid artery stenosis demonstrated. Vertebrals are patent w/ antegrade flow.  . Cardiac catheterization  05/24/2010    No intervention - recommed medical therapy.  . Cardiovascular stress test  08/07/2006    Moderate-severe defect seen in Basal inferior, Mid inferoseptal, Mid inferior, Mid  inferolateral, and Apical inferior regions - consistent w/ infarct/scar. No scintigraphic evidence of inducible myocardial ischemia.  . Transthoracic echocardiogram  08/29/2011    EF 55-60%, moderate LVH,   . Esophagogastroduodenoscopy N/A 02/08/2014    Procedure: ESOPHAGOGASTRODUODENOSCOPY (EGD);  Surgeon: [name], MD;  Location: [facility] ENDOSCOPY;  Service: Endoscopy;  Laterality: N/A;  . Colonoscopy N/A 05/12/2014    Procedure: COLONOSCOPY;  Surgeon: [name], MD;  Location: [place];  Service: Endoscopy;  Laterality: N/A;  . Enteroscopy N/A 05/13/2014    Procedure: ENTEROSCOPY;  Surgeon: [name], MD;  Location: [place];  Service: Endoscopy;  Laterality: N/A;    Social History: History   Social History  . Marital Status: Widowed    Spouse Name: N/A    Number of Children: 7  . Years of Education: N/A   Occupational History  . Disabled    Social History Main Topics  . Smoking status: Never Smoker   . Smokeless tobacco: Former Systems developer    Types: Snuff    Quit date: 10/13/2006     Comment: 02/26/12 "stopped snuff 4-6 years ago"  . Alcohol Use: No     Comment: "stopped drinking alcohol ~ 1980's"  . Drug Use: No  . Sexual Activity: No   Other Topics Concern  . None   Social History Narrative   ** Merged History Encounter **       Lives with daughter    Family History: Family History  Problem Relation Age of Onset  . Diabetes insipidus Mother   . Hypertension Mother   . Hypertension Father   . Hypertension Sister   . Hypertension Child   . Stomach cancer Brother     Allergies: Allergies  Allergen Reactions  . Baking Soda-Fluoride [Sodium Fluoride] Nausea And Vomiting  . Magnesium Hydroxide Nausea And Vomiting    Current Facility-Administered Medications  Medication Dose Route Frequency Provider Last Rate Last Dose  . 0.9 %  sodium chloride infusion  250 mL Intravenous PRN Shanda Howells, MD      . albuterol (PROVENTIL HFA;VENTOLIN HFA) 108 (90  BASE) MCG/ACT inhaler 2 puff  2 puff Inhalation Q6H PRN Shanda Howells, MD      . insulin glargine (LANTUS) injection 5 Units  5 Units Subcutaneous BID Shanda Howells, MD      . pantoprazole (PROTONIX) injection 40 mg  40 mg Intravenous Q12H Shanda Howells, MD      . sodium chloride 0.9 % injection 3 mL  3 mL Intravenous Q12H Shanda Howells, MD      . sodium chloride 0.9 % injection 3 mL  3 mL Intravenous PRN Shanda Howells, MD       Current Outpatient Prescriptions  Medication Sig Dispense Refill  . acetaminophen (TYLENOL) 500  MG tablet Take 500 mg by mouth every 6 (six) hours as needed for moderate pain or headache.      . albuterol (PROAIR HFA) 108 (90 BASE) MCG/ACT inhaler Inhale 2 puffs into the lungs every 6 (six) hours as needed for wheezing.  8.5 g  11  . amLODipine (NORVASC) 5 MG tablet Take 1 tablet (5 mg total) by mouth daily.  30 tablet  1  . aspirin 81 MG tablet Take 1 tablet (81 mg total) by mouth daily.  30 tablet    . atorvastatin (LIPITOR) 40 MG tablet Take 1 tablet (40 mg total) by mouth daily at 6 PM.  30 tablet  11  . ferrous sulfate 325 (65 FE) MG tablet Take 1 tablet (325 mg total) by mouth 2 (two) times daily with a meal.  60 tablet  5  . insulin glargine (LANTUS) 100 UNIT/ML injection 5 units in the a.m. and 8 units each bedtime  10 mL  11  . isosorbide mononitrate (IMDUR) 60 MG 24 hr tablet Take 1 tablet (60 mg total) by mouth daily.  30 tablet  2  . lisinopril (PRINIVIL,ZESTRIL) 5 MG tablet       . loratadine (ALLERGY RELIEF) 10 MG tablet Take 10 mg by mouth daily.      . magnesium oxide (MAG-OX) 400 (241.3 MG) MG tablet Take 1 tablet (400 mg total) by mouth 2 (two) times daily.  60 tablet  1  . metoprolol succinate (TOPROL-XL) 12.5 mg TB24 24 hr tablet Take 0.5 tablets (12.5 mg total) by mouth daily.  30 tablet  1  . Multiple Vitamins-Minerals (ALIVE WOMENS 50+ PO) Take 1 tablet by mouth daily.      Marland Kitchen omeprazole (PRILOSEC) 20 MG capsule Take 1 capsule (20 mg total) by mouth  daily before breakfast.  30 capsule  11  . Phenylephrine-DM-GG (TUSSIN CF COUGH & COLD PO) Take by mouth as needed.      . polyethylene glycol (MIRALAX / GLYCOLAX) packet Take 17 g by mouth daily.      . [DISCONTINUED] pantoprazole (PROTONIX) 40 MG tablet Take 1 tablet (40 mg total) by mouth 2 (two) times daily.  60 tablet  0   Review Of Systems: 12 point ROS negative except as noted above in HPI.  Physical Exam: Filed Vitals:   07/12/14 0017  BP: 146/60  Pulse: 65  Temp: 98 F (36.7 C)  Resp: 16    General: cooperative HEENT: PERRLA and extra ocular movement intact Heart: S1, S2 normal, no murmur, rub or gallop, regular rate and rhythm Lungs: clear to auscultation, no wheezes or rales and unlabored breathing Abdomen: abdomen is soft without significant tenderness, masses, organomegaly or guarding Extremities: 2+ peripheral pulses, 1-2 + edema bilaterally  Skin:no rashes Neurology: normal without focal findings  Labs and Imaging: Lab Results  Component Value Date/Time   NA 137 07/11/2014  6:55 PM   K 4.5 07/11/2014  6:55 PM   CL 102 07/11/2014  6:55 PM   CO2 21 07/11/2014  6:55 PM   BUN 46* 07/11/2014  6:55 PM   CREATININE 1.78* 07/11/2014  6:55 PM   CREATININE 2.72* 03/24/2014  4:41 PM   GLUCOSE 223* 07/11/2014  6:55 PM   Lab Results  Component Value Date   WBC 6.5 07/11/2014   HGB 7.5* 07/11/2014   HCT 23.6* 07/11/2014   MCV 85.5 07/11/2014   PLT 242 07/11/2014    Dg Chest 2 View  07/11/2014   CLINICAL DATA:  Extreme shortness of breath for several days. History of heart disease, diabetes and hypertension.  EXAM: CHEST  2 VIEW  COMPARISON:  Radiographs 02/04/2014 and 05/10/2014. Abdominal CT 02/05/2014.  FINDINGS: There is cardiomegaly with vascular congestion. Compared with the prior studies, there are increased patchy opacities at both lung bases and increased fissural thickening. There is no consolidation or significant pleural effusion. The osseous structures appear stable.   IMPRESSION: Cardiomegaly with increased vascular congestion and patchy bibasilar opacities, likely congestive heart failure. No focal airspace disease.   Electronically Signed   By: Camie Patience M.D.   On: 07/11/2014 17:56           Shanda Howells MD  Pager: [phone]

## 2014-07-11 NOTE — ED Provider Notes (Signed)
CSN: [id number]     Arrival date & time 07/11/14  1632 History   First MD Initiated Contact with Patient 07/11/14 1822     Chief Complaint  Patient presents with  . Shortness of Breath     (Consider location/radiation/quality/duration/timing/severity/associated sxs/prior Treatment) HPI Comments: Patient is a 80 year old female with extensive past medical history including diabetes, hypertension, coronary artery disease, atrial fibrillation. She was recently hospitalized for some sort of upper GI bleed which she tells me was managed by endoscopy in some sort of injection. She presents today with complaints of weakness and shortness of breath for the past 2 days. She also reports dark stools. This was similar to her prior presentation when she was found to have the GI bleed. She denies any chest pain, fever, productive cough, or increased swelling in her legs.  Patient is a 80 y.o. female presenting with shortness of breath. The history is provided by the patient.  Shortness of Breath Severity:  Moderate Onset quality:  Gradual Duration:  2 days Timing:  Constant Progression:  Worsening Chronicity:  Recurrent Context: activity   Relieved by:  Nothing Worsened by:  Exertion Ineffective treatments:  None tried Associated symptoms: no abdominal pain, no fever and no vomiting     Past Medical History  Diagnosis Date  . Asthma   . CAD (coronary artery disease)     Pt reports MI in 2006 (no documentation).  Cardiolite in 05/2002 and 07/2006 did not reveal any reversible ischemia.  Pt follows with Dr. Rex Kras at [facility].  . CHF (congestive heart failure)     EF 25-30% with dilated LV, mild LVH, severe hypokinesis, and mod-severe reduction in RV function  . Osteoporosis   . HYPERTENSION 08/03/2006  . GASTROPARESIS, DIABETIC 08/03/2006  . HYPERLIPIDEMIA 08/03/2006  . OBSTRUCTIVE SLEEP APNEA 01/06/2008  . PERIPHERAL NEUROPATHY 08/03/2006  . GERD 08/03/2006  . LOW BACK PAIN, CHRONIC 08/03/2006   . OSTEOPOROSIS 03/21/2009  . CEREBRAL EMBOLISM, WITH INFARCTION 07/02/2010  . Angina   . Myocardial infarction "2 or 3"  . Pneumonia 02/26/12    "a few times; probably even today"  . Shortness of breath     "all the time"  . DIABETES MELLITUS, TYPE II 11/04/1983  . Blood transfusion 08/2011  . Lower GI bleeding 08/2011 and 01/2014  . Chronic daily headache   . Migraines   . Stroke summer 2011    "made my left hip worse"  . Uterine cancer   . Nonischemic cardiomyopathy 05/2010    Left heart catheterization:2011. Nonobstructive coronary artery disease.  . Pulmonary hypertension   . Hypertension   . NSTEMI (non-ST elevated myocardial infarction) 01/2014    type II with CVA  . Acute embolic stroke 1/94/1740  . Non-ST elevation MI (NSTEMI) 02/04/2014  . E. coli UTI 03/02/2014  . Renal failure, acute 02/04/2014  . Iron deficiency anemia secondary to blood loss (chronic) 01/06/2008    Qualifier: Diagnosis of  By: Tomasa Hosteller MD, Veronique D.   . Nonsustained ventricular tachycardia   . Angiodysplasia of intestine with hemorrhage 05/13/2014   Past Surgical History  Procedure Laterality Date  . Esophagogastroduodenoscopy  08/26/2011    Procedure: ESOPHAGOGASTRODUODENOSCOPY (EGD);  Surgeon: [name], MD;  Location: [facility] ENDOSCOPY;  Service: Endoscopy;  Laterality: N/A;  . Colonoscopy  08/28/2011    Procedure: COLONOSCOPY;  Surgeon: [name], MD;  Location: [place];  Service: Endoscopy;  Laterality: N/A;  . Vaginal hysterectomy    . Tubal ligation    .  Cataract extraction w/ intraocular lens  implant, bilateral    . Toe surgery      "right big toe; operated on it to straighten it out; it was under"  . Polysomnogram  10/17/2005    AHI-7.28/hr. AHI REM-20.8/hr. Average oxygen saturation range during REM and NREM was 97%. Lowest oxygen saturation during REM sleep was 90%.  . Carotid duplex  05/28/2010    No significant extracranial carotid artery stenosis demonstrated. Vertebrals are  patent w/ antegrade flow.  . Cardiac catheterization  05/24/2010    No intervention - recommed medical therapy.  . Cardiovascular stress test  08/07/2006    Moderate-severe defect seen in Basal inferior, Mid inferoseptal, Mid inferior, Mid inferolateral, and Apical inferior regions - consistent w/ infarct/scar. No scintigraphic evidence of inducible myocardial ischemia.  . Transthoracic echocardiogram  08/29/2011    EF 55-60%, moderate LVH,   . Esophagogastroduodenoscopy N/A 02/08/2014    Procedure: ESOPHAGOGASTRODUODENOSCOPY (EGD);  Surgeon: [name], MD;  Location: [facility] ENDOSCOPY;  Service: Endoscopy;  Laterality: N/A;  . Colonoscopy N/A 05/12/2014    Procedure: COLONOSCOPY;  Surgeon: [name], MD;  Location: [place];  Service: Endoscopy;  Laterality: N/A;  . Enteroscopy N/A 05/13/2014    Procedure: ENTEROSCOPY;  Surgeon: [name], MD;  Location: [place];  Service: Endoscopy;  Laterality: N/A;   Family History  Problem Relation Age of Onset  . Diabetes insipidus Mother   . Hypertension Mother   . Hypertension Father   . Hypertension Sister   . Hypertension Child   . Stomach cancer Brother    History  Substance Use Topics  . Smoking status: Never Smoker   . Smokeless tobacco: Former Systems developer    Types: Snuff    Quit date: 10/13/2006     Comment: 02/26/12 "stopped snuff 4-6 years ago"  . Alcohol Use: No     Comment: "stopped drinking alcohol ~ 1980's"   OB History   Grav Para Term Preterm Abortions TAB SAB Ect Mult Living                 Review of Systems  Constitutional: Negative for fever.  Respiratory: Positive for shortness of breath.   Gastrointestinal: Negative for vomiting and abdominal pain.  All other systems reviewed and are negative.     Allergies  Baking soda-fluoride and Magnesium hydroxide  Home Medications   Prior to Admission medications   Medication Sig Start Date End Date Taking? Authorizing Provider  acetaminophen (TYLENOL) 500  MG tablet Take 500 mg by mouth every 6 (six) hours as needed for moderate pain or headache.    Historical Provider, MD  albuterol (PROAIR HFA) 108 (90 BASE) MCG/ACT inhaler Inhale 2 puffs into the lungs every 6 (six) hours as needed for wheezing. 05/25/14   Lavon Paganini Angiulli, PA-C  amLODipine (NORVASC) 5 MG tablet Take 1 tablet (5 mg total) by mouth daily. 05/25/14   Lavon Paganini Angiulli, PA-C  aspirin 81 MG tablet Take 1 tablet (81 mg total) by mouth daily. 05/25/14   Lavon Paganini Angiulli, PA-C  atorvastatin (LIPITOR) 40 MG tablet Take 1 tablet (40 mg total) by mouth daily at 6 PM. 05/25/14   Lavon Paganini Angiulli, PA-C  ferrous sulfate 325 (65 FE) MG tablet Take 1 tablet (325 mg total) by mouth 2 (two) times daily with a meal. 03/16/14   [name], MD  insulin glargine (LANTUS) 100 UNIT/ML injection Inject 0.08 mLs (8 Units total) into the skin at bedtime. 05/15/14  Samella Parr, NP  insulin glargine (LANTUS) 100 UNIT/ML injection 5 units in the a.m. and 8 units each bedtime 05/25/14   Lavon Paganini Angiulli, PA-C  isosorbide mononitrate (IMDUR) 60 MG 24 hr tablet Take 1 tablet (60 mg total) by mouth daily. 05/25/14   Lavon Paganini Angiulli, PA-C  lisinopril (PRINIVIL,ZESTRIL) 5 MG tablet  06/15/14   Historical Provider, MD  loratadine (ALLERGY RELIEF) 10 MG tablet Take 10 mg by mouth daily.    Historical Provider, MD  magnesium oxide (MAG-OX) 400 (241.3 MG) MG tablet Take 1 tablet (400 mg total) by mouth 2 (two) times daily. 05/25/14   Lavon Paganini Angiulli, PA-C  metoprolol succinate (TOPROL-XL) 12.5 mg TB24 24 hr tablet Take 0.5 tablets (12.5 mg total) by mouth daily. 05/25/14   Lavon Paganini Angiulli, PA-C  Multiple Vitamins-Minerals (ALIVE WOMENS 50+ PO) Take 1 tablet by mouth daily.    Historical Provider, MD  omeprazole (PRILOSEC) 20 MG capsule Take 1 capsule (20 mg total) by mouth daily before breakfast. 05/25/14   Lavon Paganini Angiulli, PA-C  Phenylephrine-DM-GG (TUSSIN CF COUGH & COLD PO) Take by mouth as needed.    Historical  Provider, MD  polyethylene glycol (MIRALAX / GLYCOLAX) packet Take 17 g by mouth daily.    Historical Provider, MD   LMP 12/19/1968 Physical Exam  Nursing note and vitals reviewed. Constitutional: She is oriented to person, place, and time. She appears well-developed and well-nourished. No distress.  HENT:  Head: Normocephalic and atraumatic.  Mouth/Throat: Oropharynx is clear and moist.  Neck: Normal range of motion. Neck supple.  Cardiovascular: Normal rate and regular rhythm.  Exam reveals no gallop and no friction rub.   No murmur heard. Pulmonary/Chest: Effort normal and breath sounds normal. No respiratory distress. She has no wheezes.  Abdominal: Soft. Bowel sounds are normal. She exhibits no distension. There is no tenderness.  Genitourinary: Guaiac positive stool.  There is dark stool present that is loose.  Musculoskeletal: Normal range of motion. She exhibits edema.  There is 1+ edema of the bilateral lower extremities.  Neurological: She is alert and oriented to person, place, and time.  Skin: Skin is warm and dry. She is not diaphoretic.    ED Course  Procedures (including critical care time) Labs Review Labs Reviewed  [place]  CBC WITH DIFFERENTIAL  I-STAT [place], ED  POC OCCULT BLOOD, ED  TYPE AND SCREEN    Imaging Review Dg Chest 2 View  07/11/2014   CLINICAL DATA:  Extreme shortness of breath for several days. History of heart disease, diabetes and hypertension.  EXAM: CHEST  2 VIEW  COMPARISON:  Radiographs 02/04/2014 and 05/10/2014. Abdominal CT 02/05/2014.  FINDINGS: There is cardiomegaly with vascular congestion. Compared with the prior studies, there are increased patchy opacities at both lung bases and increased fissural thickening. There is no consolidation or significant pleural effusion. The osseous structures appear stable.  IMPRESSION: Cardiomegaly with increased vascular congestion and patchy  bibasilar opacities, likely congestive heart failure. No focal airspace disease.   Electronically Signed   By: Camie Patience M.D.   On: 07/11/2014 17:56     EKG Interpretation   Date/Time:  Tuesday July 11 2014 19:00:26 EDT Ventricular Rate:  69 PR Interval:  194 QRS Duration: 157 QT Interval:  512 QTC Calculation: 549 R Axis:   -95 Text Interpretation:  Sinus rhythm Left bundle branch block Baseline  wander in lead(s) V2 V6 Confirmed by Beau Fanny  MD, Nathaneil Canary ([id number]) on  07/11/2014 7:21:32 PM      MDM   Final diagnoses:  None    Patient is a 80 year old female with history of GI bleed. She presents today with shortness of breath and melanotic stools. Her hemoglobin is 7.5 and her stool is melanotic and heme-positive. Her BNP and chest x-ray also reveal evidence suggestive of congestive heart failure. She's been given IV Lasix and will likely be given a transfusion. I've spoken with Dr. From gastroenterology who is recommending admission to the hospitalist service. Dr. Ernestina Patches consulted and agrees to admit.    Veryl Speak, MD 07/11/14 [phone]

## 2014-07-11 NOTE — ED Notes (Signed)
Pt c/o dark stools and SOB; pt appears pale; pt sts does take iron

## 2014-07-12 ENCOUNTER — Encounter (HOSPITAL_COMMUNITY): Payer: Self-pay | Admitting: *Deleted

## 2014-07-12 DIAGNOSIS — K5521 Angiodysplasia of colon with hemorrhage: Secondary | ICD-10-CM

## 2014-07-12 DIAGNOSIS — K922 Gastrointestinal hemorrhage, unspecified: Secondary | ICD-10-CM

## 2014-07-12 DIAGNOSIS — D649 Anemia, unspecified: Secondary | ICD-10-CM

## 2014-07-12 DIAGNOSIS — K921 Melena: Secondary | ICD-10-CM

## 2014-07-12 LAB — CBC
HCT: 18.3 % — ABNORMAL LOW (ref 36.0–46.0)
HCT: 24.3 % — ABNORMAL LOW (ref 36.0–46.0)
HCT: 23.5 % — ABNORMAL LOW (ref 36.0–46.0)
HEMATOCRIT: 23.9 % — AB (ref 36.0–46.0)
HEMOGLOBIN: 7.8 g/dL — AB (ref 12.0–15.0)
Hemoglobin: 6 g/dL — CL (ref 12.0–15.0)
Hemoglobin: 8.1 g/dL — ABNORMAL LOW (ref 12.0–15.0)
Hemoglobin: 8 g/dL — ABNORMAL LOW (ref 12.0–15.0)
MCH: 27.3 pg (ref 26.0–34.0)
MCH: 29.8 pg (ref 26.0–34.0)
MCH: 28.7 pg (ref 26.0–34.0)
MCH: 28.5 pg (ref 26.0–34.0)
MCHC: 32.8 g/dL (ref 30.0–36.0)
MCHC: 33.9 g/dL (ref 30.0–36.0)
MCHC: 32.9 g/dL (ref 30.0–36.0)
MCHC: 33.2 g/dL (ref 30.0–36.0)
MCV: 83.2 fL (ref 78.0–100.0)
MCV: 87.9 fL (ref 78.0–100.0)
MCV: 87.1 fL (ref 78.0–100.0)
MCV: 85.8 fL (ref 78.0–100.0)
PLATELETS: 212 10*3/uL (ref 150–400)
PLATELETS: 209 10*3/uL (ref 150–400)
Platelets: 211 10*3/uL (ref 150–400)
Platelets: 188 10*3/uL (ref 150–400)
RBC: 2.2 MIL/uL — ABNORMAL LOW (ref 3.87–5.11)
RBC: 2.72 MIL/uL — ABNORMAL LOW (ref 3.87–5.11)
RBC: 2.79 MIL/uL — ABNORMAL LOW (ref 3.87–5.11)
RBC: 2.74 MIL/uL — ABNORMAL LOW (ref 3.87–5.11)
RDW: 16.3 % — ABNORMAL HIGH (ref 11.5–15.5)
RDW: 15.8 % — ABNORMAL HIGH (ref 11.5–15.5)
RDW: 15.7 % — ABNORMAL HIGH (ref 11.5–15.5)
RDW: 15.9 % — ABNORMAL HIGH (ref 11.5–15.5)
WBC: 5.6 10*3/uL (ref 4.0–10.5)
WBC: 5.9 10*3/uL (ref 4.0–10.5)
WBC: 6.1 10*3/uL (ref 4.0–10.5)
WBC: 6.4 10*3/uL (ref 4.0–10.5)

## 2014-07-12 LAB — GLUCOSE, CAPILLARY
GLUCOSE-CAPILLARY: 202 mg/dL — AB (ref 70–99)
GLUCOSE-CAPILLARY: 211 mg/dL — AB (ref 70–99)
Glucose-Capillary: 166 mg/dL — ABNORMAL HIGH (ref 70–99)
Glucose-Capillary: 134 mg/dL — ABNORMAL HIGH (ref 70–99)
Glucose-Capillary: 123 mg/dL — ABNORMAL HIGH (ref 70–99)
Glucose-Capillary: 273 mg/dL — ABNORMAL HIGH (ref 70–99)
Glucose-Capillary: 104 mg/dL — ABNORMAL HIGH (ref 70–99)

## 2014-07-12 LAB — PREPARE RBC (CROSSMATCH)

## 2014-07-12 LAB — COMPREHENSIVE METABOLIC PANEL
ALK PHOS: 83 U/L (ref 39–117)
ALT: 12 U/L (ref 0–35)
AST: 9 U/L (ref 0–37)
Albumin: 2.8 g/dL — ABNORMAL LOW (ref 3.5–5.2)
Anion gap: 11 (ref 5–15)
BUN: 47 mg/dL — ABNORMAL HIGH (ref 6–23)
CALCIUM: 8.9 mg/dL (ref 8.4–10.5)
CO2: 23 meq/L (ref 19–32)
Chloride: 106 mEq/L (ref 96–112)
Creatinine, Ser: 1.83 mg/dL — ABNORMAL HIGH (ref 0.50–1.10)
GFR, EST AFRICAN AMERICAN: 29 mL/min — AB (ref >90)
GFR, EST NON AFRICAN AMERICAN: 25 mL/min — AB (ref >90)
GLUCOSE: 192 mg/dL — AB (ref 70–99)
Potassium: 4.1 mEq/L (ref 3.7–5.3)
Sodium: 140 mEq/L (ref 137–147)
Total Bilirubin: <0.2 mg/dL — ABNORMAL LOW (ref 0.3–1.2)
Total Protein: 6.4 g/dL (ref 6.0–8.3)

## 2014-07-12 LAB — DIFFERENTIAL
BASOS ABS: 0 10*3/uL (ref 0.0–0.1)
Basophils Relative: 0 % (ref 0–1)
Eosinophils Absolute: 0.1 10*3/uL (ref 0.0–0.7)
Eosinophils Relative: 2 % (ref 0–5)
LYMPHS ABS: 1.9 10*3/uL (ref 0.7–4.0)
LYMPHS PCT: 34 % (ref 12–46)
MONOS PCT: 8 % (ref 3–12)
Monocytes Absolute: 0.4 10*3/uL (ref 0.1–1.0)
NEUTROS PCT: 56 % (ref 43–77)
Neutro Abs: 3.2 10*3/uL (ref 1.7–7.7)

## 2014-07-12 LAB — TROPONIN I: Troponin I: <0.3 ng/mL (ref <0.30)

## 2014-07-12 LAB — MRSA PCR SCREENING: MRSA by PCR: NEGATIVE

## 2014-07-12 MED ORDER — POLYETHYLENE GLYCOL 3350 17 G PO PACK
17.0000 g | PACK | Freq: Every day | ORAL | Status: DC
Start: 2014-07-12 — End: 2014-07-15
  Administered 2014-07-12 – 2014-07-15 (×3): 17 g via ORAL
  Filled 2014-07-12 (×4): qty 1

## 2014-07-12 MED ORDER — ISOSORBIDE MONONITRATE ER 60 MG PO TB24
60.0000 mg | ORAL_TABLET | Freq: Every day | ORAL | Status: DC
  Administered 2014-07-12 – 2014-07-15 (×4): 60 mg via ORAL
  Filled 2014-07-12 (×4): qty 1

## 2014-07-12 MED ORDER — FUROSEMIDE 10 MG/ML IJ SOLN
20.0000 mg | Freq: Once | INTRAMUSCULAR | Status: AC
  Administered 2014-07-12: 20 mg via INTRAVENOUS
  Filled 2014-07-12: qty 2

## 2014-07-12 MED ORDER — ALBUTEROL SULFATE (2.5 MG/3ML) 0.083% IN NEBU: 3.0000 mL | INHALATION_SOLUTION | Freq: Four times a day (QID) | RESPIRATORY_TRACT | Status: DC | PRN

## 2014-07-12 MED ORDER — ATORVASTATIN CALCIUM 40 MG PO TABS
40.0000 mg | ORAL_TABLET | Freq: Every day | ORAL | Status: DC
  Administered 2014-07-12 – 2014-07-14 (×3): 40 mg via ORAL
  Filled 2014-07-12 (×4): qty 1

## 2014-07-12 MED ORDER — SODIUM CHLORIDE 0.9 % IV SOLN
INTRAVENOUS | Status: DC
  Administered 2014-07-13: 21:00:00 via INTRAVENOUS

## 2014-07-12 MED ORDER — SODIUM CHLORIDE 0.9 % IV SOLN
Freq: Once | INTRAVENOUS | Status: DC
Start: 2014-07-12 — End: 2014-07-12

## 2014-07-12 MED ORDER — INSULIN ASPART 100 UNIT/ML ~~LOC~~ SOLN: 0.0000 [IU] | Freq: Every day | SUBCUTANEOUS | Status: DC

## 2014-07-12 MED ORDER — ALBUTEROL SULFATE (2.5 MG/3ML) 0.083% IN NEBU: 3.0000 mL | INHALATION_SOLUTION | RESPIRATORY_TRACT | Status: DC | PRN

## 2014-07-12 MED ORDER — FERROUS SULFATE 325 (65 FE) MG PO TABS
325.0000 mg | ORAL_TABLET | Freq: Two times a day (BID) | ORAL | Status: DC
  Administered 2014-07-12 – 2014-07-15 (×5): 325 mg via ORAL
  Filled 2014-07-12 (×8): qty 1

## 2014-07-12 MED ORDER — INSULIN ASPART 100 UNIT/ML ~~LOC~~ SOLN
0.0000 [IU] | Freq: Three times a day (TID) | SUBCUTANEOUS | Status: DC
  Administered 2014-07-12 – 2014-07-14 (×2): 3 [IU] via SUBCUTANEOUS
  Administered 2014-07-14 (×2): 7 [IU] via SUBCUTANEOUS
  Administered 2014-07-15: 5 [IU] via SUBCUTANEOUS
  Administered 2014-07-15: 7 [IU] via SUBCUTANEOUS

## 2014-07-12 MED ORDER — AMLODIPINE BESYLATE 5 MG PO TABS
5.0000 mg | ORAL_TABLET | Freq: Every day | ORAL | Status: DC
  Filled 2014-07-12: qty 1

## 2014-07-12 MED ORDER — METOPROLOL SUCCINATE 12.5 MG HALF TABLET
12.5000 mg | ORAL_TABLET | Freq: Every day | ORAL | Status: DC
  Administered 2014-07-12 – 2014-07-15 (×4): 12.5 mg via ORAL
  Filled 2014-07-12 (×4): qty 1

## 2014-07-12 NOTE — Progress Notes (Signed)
Utilization review completed.  

## 2014-07-12 NOTE — Progress Notes (Signed)
Inpatient Diabetes Program Recommendations  AACE/ADA: New Consensus Statement on Inpatient Glycemic Control (2013)  Target Ranges:  Prepandial:   less than 140 mg/dL      Peak postprandial:   less than 180 mg/dL (1-2 hours)      Critically ill patients:  140 - 180 mg/dL   Reason for Assessment:  Results for [name], [name] (MRN [id number]) as of 07/12/2014 10:22  Ref. Range 07/12/2014 00:36 07/12/2014 04:28 07/12/2014 08:26  Glucose-Capillary Latest Range: 70-99 mg/dL 202 (H) 166 (H) 134 (H)   Diabetes history: Type 2 diabetes Outpatient Diabetes medications: Lantus 5 units bid Current orders for Inpatient glycemic control:  Lantus 5 units bid Consider adding Novolog sensitive correction tid with meals while patient is in the hospital. Thanks, Adah Perl, RN, BC-ADM Inpatient Diabetes Coordinator Pager [phone]

## 2014-07-12 NOTE — Progress Notes (Signed)
[place] TEAM 1 - Stepdown/ICU TEAM Progress Note  [name] RXV:[id number] DOB: [birth year] DOA: 07/11/2014 PCP: [name], MD  Admit HPI / Brief Narrative: 80 yo F with medical history of recent admission 5/09-3/2 for acute embolic CVA and GIB 2/2 small bowel AVM s/p clipping and NSTEMI who presented 929 with weakness, GIB, and an apparent acute on chronic CHF exacerbation. Per the family, pt was noted to be progressively weaker. Family also noticed pt with dark/maroon colored stools. Family/pt denied NSAID use apart from baby ASA.  Marland Kitchen  On presentation to the ER, BP was mildly elevated into 160s-170s. Otherwise hemodynamically stable. Hgb 7.5, Cr 1.78, BUN 46, glu 223. CXR noted cardiomegaly.    HPI/Subjective: Pt is resting comfortably at present with no c/o chest pain, abdom pain, or sob.  Assessment/Plan:  Recurrent GIB recent admission with small bowel AVM s/p clipping by GI 04/2014 - Hgb nadir of 6.0 - HOLD baby ASA - for repeat small bowel enteroscopy 101 - Colonoscopy performed recently was unremarkable  Acute / subacute blood loss anemia Hgb nadir of 6.0 - s/p 1U PRBC - follow Hgb in serial fashion - goal is to keep Hgb 8.0 or >   Generalized weakness multifactorial with contributions from mild CHF exacerbation, symptomatic anemia - PT/OT to eval   Acute on chronic systolic and diastolic CHF  EF 67-12% and grade 2 diastolic dysfunction on 01/5808 ECHO - no evidence of significant volume overload at this time - follow Is/Os and daily weights   ?CAD S/p Low risk stress nuclear study with an inferior wall scar and mildly depressed LV systolic function July 9833  Hx of Afib currently maintaining NSR - not anticoagulation candidate due to GIB - follow on tele   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 (acute right parietal infarcts) baseline L sided deficits   Acute kidney injury w/ Stage 4 CKD basline crt appears to be ~1.5-1.6 - follow w/ transfusion - hold ACE for  now   DM 2 CBG reasonably controlled - follow trend   HTN BP poorly controlled - adjust tx and follow trend   HLD Cont home medical tx   Code Status: DNR Family Communication: husband present at time of exam  Disposition Plan: stable for transfer to tele bed - GI w/u underway   Consultants: GI  Procedures: none  Antibiotics: none  DVT prophylaxis: SCDs  Objective: Blood pressure 154/55, pulse 64, temperature 98.4 F (36.9 C), temperature source Oral, resp. rate 13, height 5\' 2"  (1.575 m), weight 67.7 kg (149 lb 4 oz), last menstrual period 12/19/1968, SpO2 100.00%.  Intake/Output Summary (Last 24 hours) at 07/12/14 1457 Last data filed at 07/12/14 1100  Gross per 24 hour  Intake    400 ml  Output   1350 ml  Net   -950 ml   Exam: General: No acute respiratory distress Lungs: Clear to auscultation bilaterally without wheezes or crackles Cardiovascular: Regular rate and rhythm without murmur gallop or rub normal S1 and S2 Abdomen: Nontender, nondistended, soft, bowel sounds positive, no rebound, no ascites, no appreciable mass Extremities: No significant cyanosis, clubbing, or edema bilateral lower extremities  Data Reviewed: Basic Metabolic Panel:  Recent Labs Lab 07/11/14 1855 07/12/14 0146  NA 137 140  K 4.5 4.1  CL 102 106  CO2 21 23  GLUCOSE 223* 192*  BUN 46* 47*  CREATININE 1.78* 1.83*  CALCIUM 9.3 8.9   Liver Function Tests:  Recent Labs Lab 07/12/14 0146  AST 9  ALT 12  ALKPHOS 83  BILITOT <0.2*  PROT 6.4  ALBUMIN 2.8*   Coags:  Recent Labs Lab 07/11/14 1855  INR 1.24   CBC:  Recent Labs Lab 07/11/14 1855 07/12/14 0146 07/12/14 0850 07/12/14 1246  WBC 6.5 5.6 5.9 6.1  NEUTROABS 4.7 3.2  --   --   HGB 7.5* 6.0* 8.1* 8.0*  HCT 23.6* 18.3* 23.9* 24.3*  MCV 85.5 83.2 87.9 87.1  PLT 242 211 212 209    Cardiac Enzymes:  Recent Labs Lab 07/11/14 2113 07/12/14 0146 07/12/14 0845  TROPONINI <0.30 <0.30 <0.30   BNP  (last 3 results)  Recent Labs  12/05/13 0448 05/10/14 0736 07/11/14 1855  PROBNP 6023.0* 14464.0* 6841.0*    CBG:  Recent Labs Lab 07/12/14 0036 07/12/14 0428 07/12/14 0826 07/12/14 1136  GLUCAP 202* 166* 134* 123*    Recent Results (from the past 240 hour(s))  MRSA PCR SCREENING     Status: None   Collection Time    07/12/14 12:19 AM      Result Value Ref Range Status   MRSA by PCR NEGATIVE  NEGATIVE Final   Comment:            The GeneXpert MRSA Assay (FDA     approved for NASAL specimens     only), is one component of a     comprehensive MRSA colonization     surveillance program. It is not     intended to diagnose MRSA     infection nor to guide or     monitor treatment for     MRSA infections.     Studies:  Recent x-ray studies have been reviewed in detail by the Attending Physician  Scheduled Meds:  Scheduled Meds: . sodium chloride   Intravenous Once  . sodium chloride   Intravenous Once  . insulin glargine  5 Units Subcutaneous BID  . pantoprazole (PROTONIX) IV  40 mg Intravenous Q12H  . sodium chloride  3 mL Intravenous Q12H    Time spent on care of this patient: 35 mins   MCCLUNG,JEFFREY T , MD   [facility] Office  [phone] Pager - Text Page per Shea Burgher as per below:  On-Call/Text Page:      [email or web address]      password [email or web address]  If 7PM-7AM, please contact night-coverage [email or web address] Password [email or web address] 07/12/2014, 2:57 PM   LOS: 1 day

## 2014-07-12 NOTE — Progress Notes (Addendum)
Received orders to transfer pt to 3W, VS stable. Attempted to call report to RN on 3W, awaiting call back. Pt and son aware of transfer.  Report called to [facility] on 3W.

## 2014-07-12 NOTE — Consult Note (Signed)
Referring Provider: Dr Stark Jock Primary Care Physician:  [name], MD Primary Gastroenterologist:  Dr. Carlean Purl  Reason for Consultation:   GI bleed, symptomatic anemia     HPI: [name] is a 80 y.o. female with history of coronary artery disease, diabetes mellitus, peripheral neuropathy, diastolic congestive heart failure, chronic renal insufficiency, as well as GI bleed and early dementia. She had hospitalization from 02/04/2014 to 02/10/2014, for an acute stroke, possible watershed infarct versus embolic strokes. She has history of atrial fibrillation (recent event monitors did not show A. Fib it did show some PVCs and short bursts of nonsustained ventricular tachycardia.) recent NSTEMI with outpatient evaluation by cardiology during the stress test done on 05/01/9469, combined systolic and diastolic heart failure (ejection fraction of 96-28%, grade 2 diastolic dysfunction on 3/66/2947). She has been on anticoagulation in past but was taken off this due to GI bleed. Since then she has been on ASA. In July she was weak and SOB and was brought to ER. HgB was 4.7 and she was FOBT+. She had a colonoscopy on 7/31 showing a normal colon. She had a pill endo 8/1 with AVM found in proximal jejunum, the remainder of small bowel was normal. EGD in April showed gastritis. She was transfused to a Hgb of 9.1. Over the past few days, pt has felt weak and SOB. She has also had dark stools. HgB found to be 7.5 in ER with FOBT + stools. HgB dropped to 6 and pt received 1 unit prbc early this morning and has a repeat CBC pending. Pt has no abd pain. No N/V.       Past Medical History  Diagnosis Date  . Asthma   . CAD (coronary artery disease)     Pt reports MI in 2006 (no documentation).  Cardiolite in 05/2002 and 07/2006 did not reveal any reversible ischemia.  Pt follows with Dr. Rex Kras at [facility].  . CHF (congestive heart failure)     EF 25-30% with dilated LV, mild LVH, severe hypokinesis, and mod-severe  reduction in RV function  . Osteoporosis   . HYPERTENSION 08/03/2006  . GASTROPARESIS, DIABETIC 08/03/2006  . HYPERLIPIDEMIA 08/03/2006  . OBSTRUCTIVE SLEEP APNEA 01/06/2008  . PERIPHERAL NEUROPATHY 08/03/2006  . GERD 08/03/2006  . LOW BACK PAIN, CHRONIC 08/03/2006  . OSTEOPOROSIS 03/21/2009  . CEREBRAL EMBOLISM, WITH INFARCTION 07/02/2010  . Angina   . Myocardial infarction "2 or 3"  . Pneumonia 02/26/12    "a few times; probably even today"  . Shortness of breath     "all the time"  . DIABETES MELLITUS, TYPE II 11/04/1983  . Blood transfusion 08/2011  . Lower GI bleeding 08/2011 and 01/2014  . Chronic daily headache   . Migraines   . Stroke summer 2011    "made my left hip worse"  . Uterine cancer   . Nonischemic cardiomyopathy 05/2010    Left heart catheterization:2011. Nonobstructive coronary artery disease.  . Pulmonary hypertension   . Hypertension   . NSTEMI (non-ST elevated myocardial infarction) 01/2014    type II with CVA  . Acute embolic stroke 6/54/6503  . Non-ST elevation MI (NSTEMI) 02/04/2014  . E. coli UTI 03/02/2014  . Renal failure, acute 02/04/2014  . Iron deficiency anemia secondary to blood loss (chronic) 01/06/2008    Qualifier: Diagnosis of  By: Tomasa Hosteller MD, Veronique D.   . Nonsustained ventricular tachycardia   . Angiodysplasia of intestine with hemorrhage 05/13/2014    Past Surgical History  Procedure Laterality  Date  . Esophagogastroduodenoscopy  08/26/2011    Procedure: ESOPHAGOGASTRODUODENOSCOPY (EGD);  Surgeon: [name], MD;  Location: [facility] ENDOSCOPY;  Service: Endoscopy;  Laterality: N/A;  . Colonoscopy  08/28/2011    Procedure: COLONOSCOPY;  Surgeon: [name], MD;  Location: [place];  Service: Endoscopy;  Laterality: N/A;  . Vaginal hysterectomy    . Tubal ligation    . Cataract extraction w/ intraocular lens  implant, bilateral    . Toe surgery      "right big toe; operated on it to straighten it out; it was under"  . Polysomnogram   10/17/2005    AHI-7.28/hr. AHI REM-20.8/hr. Average oxygen saturation range during REM and NREM was 97%. Lowest oxygen saturation during REM sleep was 90%.  . Carotid duplex  05/28/2010    No significant extracranial carotid artery stenosis demonstrated. Vertebrals are patent w/ antegrade flow.  . Cardiac catheterization  05/24/2010    No intervention - recommed medical therapy.  . Cardiovascular stress test  08/07/2006    Moderate-severe defect seen in Basal inferior, Mid inferoseptal, Mid inferior, Mid inferolateral, and Apical inferior regions - consistent w/ infarct/scar. No scintigraphic evidence of inducible myocardial ischemia.  . Transthoracic echocardiogram  08/29/2011    EF 55-60%, moderate LVH,   . Esophagogastroduodenoscopy N/A 02/08/2014    Procedure: ESOPHAGOGASTRODUODENOSCOPY (EGD);  Surgeon: [name], MD;  Location: [facility] ENDOSCOPY;  Service: Endoscopy;  Laterality: N/A;  . Colonoscopy N/A 05/12/2014    Procedure: COLONOSCOPY;  Surgeon: [name], MD;  Location: [place];  Service: Endoscopy;  Laterality: N/A;  . Enteroscopy N/A 05/13/2014    Procedure: ENTEROSCOPY;  Surgeon: [name], MD;  Location: [place];  Service: Endoscopy;  Laterality: N/A;    Prior to Admission medications   Medication Sig Start Date End Date Taking? Authorizing Provider  acetaminophen (TYLENOL) 500 MG tablet Take 500 mg by mouth every 6 (six) hours as needed for moderate pain or headache.   Yes Historical Provider, MD  albuterol (PROAIR HFA) 108 (90 BASE) MCG/ACT inhaler Inhale 2 puffs into the lungs every 6 (six) hours as needed for wheezing. 05/25/14  Yes Daniel J Angiulli, PA-C  amLODipine (NORVASC) 5 MG tablet Take 1 tablet (5 mg total) by mouth daily. 05/25/14  Yes Daniel J Angiulli, PA-C  aspirin 81 MG tablet Take 1 tablet (81 mg total) by mouth daily. 05/25/14  Yes Daniel J Angiulli, PA-C  atorvastatin (LIPITOR) 40 MG tablet Take 1 tablet (40 mg total) by mouth daily at 6 PM.  05/25/14  Yes Lavon Paganini Angiulli, PA-C  ferrous sulfate 325 (65 FE) MG tablet Take 1 tablet (325 mg total) by mouth 2 (two) times daily with a meal. 03/16/14  Yes [name], MD  insulin glargine (LANTUS) 100 UNIT/ML injection 5 units in the a.m. and 8 units each bedtime 05/25/14  Yes Daniel J Angiulli, PA-C  isosorbide mononitrate (IMDUR) 60 MG 24 hr tablet Take 1 tablet (60 mg total) by mouth daily. 05/25/14  Yes Daniel J Angiulli, PA-C  lisinopril (PRINIVIL,ZESTRIL) 5 MG tablet  06/15/14  Yes Historical Provider, MD  loratadine (ALLERGY RELIEF) 10 MG tablet Take 10 mg by mouth daily.   Yes Historical Provider, MD  magnesium oxide (MAG-OX) 400 (241.3 MG) MG tablet Take 1 tablet (400 mg total) by mouth 2 (two) times daily. 05/25/14  Yes Daniel J Angiulli, PA-C  metoprolol succinate (TOPROL-XL) 12.5 mg TB24 24 hr tablet Take 0.5 tablets (12.5 mg total) by mouth daily.  05/25/14  Yes Daniel J Angiulli, PA-C  Multiple Vitamins-Minerals (ALIVE WOMENS 50+ PO) Take 1 tablet by mouth daily.   Yes Historical Provider, MD  omeprazole (PRILOSEC) 20 MG capsule Take 1 capsule (20 mg total) by mouth daily before breakfast. 05/25/14  Yes Daniel J Angiulli, PA-C  Phenylephrine-DM-GG (TUSSIN CF COUGH & COLD PO) Take by mouth as needed.   Yes Historical Provider, MD  polyethylene glycol (MIRALAX / GLYCOLAX) packet Take 17 g by mouth daily.   Yes Historical Provider, MD    Current Facility-Administered Medications  Medication Dose Route Frequency Provider Last Rate Last Dose  . 0.9 %  sodium chloride infusion  250 mL Intravenous PRN Shanda Howells, MD 10 mL/hr at 07/12/14 0337 250 mL at 07/12/14 0337  . 0.9 %  sodium chloride infusion   Intravenous Once Shanda Howells, MD      . 0.9 %  sodium chloride infusion   Intravenous Once Shanda Howells, MD      . albuterol (PROVENTIL) (2.5 MG/3ML) 0.083% nebulizer solution 3 mL  3 mL Inhalation Q6H PRN Shanda Howells, MD      . insulin glargine (LANTUS) injection 5 Units  5 Units  Subcutaneous BID Shanda Howells, MD   5 Units at 07/11/14 2215  . pantoprazole (PROTONIX) injection 40 mg  40 mg Intravenous Q12H Shanda Howells, MD   40 mg at 07/11/14 2215  . sodium chloride 0.9 % injection 3 mL  3 mL Intravenous Q12H Shanda Howells, MD   3 mL at 07/12/14 1000  . sodium chloride 0.9 % injection 3 mL  3 mL Intravenous PRN Shanda Howells, MD        Allergies as of 07/11/2014 - Review Complete 07/11/2014  Allergen Reaction Noted  . Baking soda-fluoride [sodium fluoride] Nausea And Vomiting 03/28/2011  . Magnesium hydroxide Nausea And Vomiting 08/28/2011    Family History  Problem Relation Age of Onset  . Diabetes insipidus Mother   . Hypertension Mother   . Hypertension Father   . Hypertension Sister   . Hypertension Child   . Stomach cancer Brother     History   Social History  . Marital Status: Widowed    Spouse Name: N/A    Number of Children: 7  . Years of Education: N/A   Occupational History  . Disabled    Social History Main Topics  . Smoking status: Never Smoker   . Smokeless tobacco: Former Systems developer    Types: Snuff    Quit date: 10/13/2006     Comment: 02/26/12 "stopped snuff 4-6 years ago"  . Alcohol Use: No     Comment: "stopped drinking alcohol ~ 1980's"  . Drug Use: No  . Sexual Activity: No   Other Topics Concern  . Not on file   Social History Narrative   ** Merged History Encounter **       Lives with daughter    Review of Systems: Gen: Denies any fever, chills, sweats, anorexia, fatigue, weakness, malaise, weight loss, and sleep disorder CV: Denies chest pain, angina, palpitations, syncope, orthopnea, PND, peripheral edema, and claudication. Resp: Denies dyspnea at rest, dyspnea with exercise, cough, sputum, wheezing, coughing up blood, and pleurisy. GI: Denies vomiting blood, jaundice, and fecal incontinence.   Denies dysphagia or odynophagia. GU : Denies urinary burning, blood in urine, urinary frequency, urinary hesitancy, nocturnal  urination, and urinary incontinence. MS: Denies joint pain, limitation of movement, and swelling, stiffness, low back pain, extremity pain. Denies muscle weakness, cramps, atrophy.  Derm:  Denies rash, itching, dry skin, hives, moles, warts, or unhealing ulcers.  Psych: Denies depression, anxiety, memory loss, suicidal ideation, hallucinations, paranoia, and confusion. Heme: Denies bruising, bleeding, and enlarged lymph nodes. Neuro:  Denies any headaches, dizziness, paresthesias. Endo:  Denies any problems with DM, thyroid, adrenal function.  Physical Exam: Vital signs in last 24 hours: Temp:  [97.9 F (36.6 C)-98.4 F (36.9 C)] 98.4 F (36.9 C) (09/30 0817) Pulse Rate:  [60-72] 65 (09/30 0817) Resp:  [15-24] 15 (09/30 0817) BP: (126-184)/(43-74) 153/60 mmHg (09/30 0817) SpO2:  [98 %-100 %] 100 % (09/30 0817) Weight:  [149 lb 4 oz (67.7 kg)] 149 lb 4 oz (67.7 kg) (09/30 0500) Last BM Date: 07/11/14 General:   Alert,  Well-developed, well-nourished, AA female, pleasant and cooperative in NAD Head:  Normocephalic and atraumatic. Eyes:  Sclera clear, no icterus.   Conjunctiva pink. Ears:  Normal auditory acuity. Nose:  No deformity, discharge,  or lesions. Mouth:  No deformity or lesions.   Neck:  Supple; no masses or thyromegaly. Lungs:  Clear throughout to auscultation.   No wheezes, crackles, or rhonchi. Heart:  Regular rate and rhythm; no murmurs, clicks, rubs,  or gallops. Abdomen:  Soft,nontender, BS active,nonpalp mass or hsm.   Rectal:  Deferred  Msk:  Symmetrical without gross deformities. . Pulses:  Normal pulses noted. Extremities:  Without clubbing or edema. Neurologic:  Alert and  oriented;  grossly normal neurologically. Skin:  Intact without significant lesions or rashes.. Psych:  Alert and cooperative. Normal mood and affect.  Intake/Output from previous day: 09/29 0701 - 09/30 0700 In: 400 [I.V.:250; Blood:150] Out: 1050 [Urine:1050] Intake/Output this shift:      Lab Results:  Recent Labs  07/11/14 1855 07/12/14 0146  WBC 6.5 5.6  HGB 7.5* 6.0*  HCT 23.6* 18.3*  PLT 242 211   BMET  Recent Labs  07/11/14 1855 07/12/14 0146  NA 137 140  K 4.5 4.1  CL 102 106  CO2 21 23  GLUCOSE 223* 192*  BUN 46* 47*  CREATININE 1.78* 1.83*  CALCIUM 9.3 8.9   LFT  Recent Labs  07/12/14 0146  PROT 6.4  ALBUMIN 2.8*  AST 9  ALT 12  ALKPHOS 83  BILITOT <0.2*   PT/INR  Recent Labs  07/11/14 1855  LABPROT 15.6*  INR 1.24   Studies/Results: Dg Chest 2 View  07/11/2014   CLINICAL DATA:  Extreme shortness of breath for several days. History of heart disease, diabetes and hypertension.  EXAM: CHEST  2 VIEW  COMPARISON:  Radiographs 02/04/2014 and 05/10/2014. Abdominal CT 02/05/2014.  FINDINGS: There is cardiomegaly with vascular congestion. Compared with the prior studies, there are increased patchy opacities at both lung bases and increased fissural thickening. There is no consolidation or significant pleural effusion. The osseous structures appear stable.  IMPRESSION: Cardiomegaly with increased vascular congestion and patchy bibasilar opacities, likely congestive heart failure. No focal airspace disease.   Electronically Signed   By: Camie Patience M.D.   On: 07/11/2014 17:56    PROCEDURES: Small bowel endoscopy 05/13/14: ENDOSCOPIC IMPRESSION: 1. An a.v. 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 The remainder of the small bowel enteroscopy exam was otherwise normal RECOMMENDATIONS: Reduce PPI to qd - no gastritis now Repeat Feraheme x 1 when appropriate oral iron regular CBC hard to know what is best re: anticoagulation and anti-PLT - would avoid anticoags but can consider 81 mg ASA given overall hx she probably has more AVM's that cannot be reached  and will need support of Hgb through PCP and/or hematology - she may also benefit from eryhtropoietin as I believe there is a component of chronic disease anemia  COLONOSCOPY  05/12/14: COLON FINDINGS: A normal appearing cecum, ileocecal valve, and appendiceal orifice were identified. The ascending, hepatic flexure, transverse, splenic flexure, descending, sigmoid colon and rectum appeared unremarkable. No polyps or cancers were seen. Retroflexed views revealed internal hemorrhoids. The time to cecum=5 minutes 0 seconds. Withdrawal time=9 minutes 0 seconds. The scope was withdrawn and the procedure completed. COMPLICATIONS: There were no complications. ENDOSCOPIC IMPRESSION: Normal colon  EGD 02/08/14: ENDOSCOPIC IMPRESSION: 1. Erosive gastritis (inflammation) was found in the gastric antrum 2. The remainder of the upper endoscopy exam was otherwise normal  IMPRESSION/PLAN: 1. Weakness. Likely due to her anemia (HgB dropped to 6), as well as her CHF. Has received 1 unit prbcs. ASAS has been stopped. Will monitor H/H and review with Dr Hilarie Fredrickson as to possible endoscopic eval. 2. GI bleed. Had jejunal AVM inpast as well as gastriris. Hold ASA. No NSAIDS. BID PPI. 3. CHF . Trop neg x 1. Diuresed per med service 4. AKI. BUN elevated. Likely CKD exacerbated duwe to CHF and GI bleed.     Druanne Bosques, Deloris Ping 07/12/2014,  Pager [phone]    Addendum--Pt reviewed with Dr Hilarie Fredrickson. Will plan on small bowel enteroscopy tomorrow.

## 2014-07-12 NOTE — Consult Note (Signed)
Patient seen, examined, and I agree with the above documentation, including the assessment and plan. 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  Likely recurrent bleeding from same etiology (I expect she has multiple of these small lesions in the small bowel) The difficulty is being able to reach lesions for ablation.  Will plan repeat small bowel enteroscopy tomorrow.   If unable to find angioectasias with push enteroscope, then options are video capsule followed by deep enteroscopy (which would need to be at tertiary center such as [facility] or [facility]) vs. Replacing iron and periodic transfusions as needed Colonoscopy performed recently was unremarkable Ongoing diuresis given CHF, eval tomorrow before procedure Monitor Hgb

## 2014-07-13 ENCOUNTER — Encounter (HOSPITAL_COMMUNITY): Payer: Medicare Other | Admitting: Certified Registered Nurse Anesthetist

## 2014-07-13 ENCOUNTER — Inpatient Hospital Stay (HOSPITAL_COMMUNITY): Payer: Medicare Other

## 2014-07-13 ENCOUNTER — Encounter (HOSPITAL_COMMUNITY): Payer: Self-pay | Admitting: Internal Medicine

## 2014-07-13 ENCOUNTER — Encounter (HOSPITAL_COMMUNITY)
Admission: EM | Disposition: A | Discharge status: Home Health | Payer: Self-pay | Source: Home / Self Care | Attending: Internal Medicine

## 2014-07-13 ENCOUNTER — Inpatient Hospital Stay (HOSPITAL_COMMUNITY): Payer: Medicare Other | Admitting: Certified Registered Nurse Anesthetist

## 2014-07-13 DIAGNOSIS — R404 Transient alteration of awareness: Secondary | ICD-10-CM

## 2014-07-13 DIAGNOSIS — R4182 Altered mental status, unspecified: Secondary | ICD-10-CM

## 2014-07-13 DIAGNOSIS — K5521 Angiodysplasia of colon with hemorrhage: Principal | ICD-10-CM

## 2014-07-13 DIAGNOSIS — K922 Gastrointestinal hemorrhage, unspecified: Secondary | ICD-10-CM

## 2014-07-13 DIAGNOSIS — I4891 Unspecified atrial fibrillation: Secondary | ICD-10-CM

## 2014-07-13 DIAGNOSIS — I5032 Chronic diastolic (congestive) heart failure: Secondary | ICD-10-CM

## 2014-07-13 DIAGNOSIS — D500 Iron deficiency anemia secondary to blood loss (chronic): Secondary | ICD-10-CM

## 2014-07-13 DIAGNOSIS — K921 Melena: Secondary | ICD-10-CM

## 2014-07-13 HISTORY — PX: ENTEROSCOPY: SHX5533

## 2014-07-13 LAB — BASIC METABOLIC PANEL
Anion gap: 9 (ref 5–15)
BUN: 40 mg/dL — ABNORMAL HIGH (ref 6–23)
CO2: 25 meq/L (ref 19–32)
Calcium: 9 mg/dL (ref 8.4–10.5)
Chloride: 109 mEq/L (ref 96–112)
Creatinine, Ser: 1.98 mg/dL — ABNORMAL HIGH (ref 0.50–1.10)
GFR calc Af Amer: 27 mL/min — ABNORMAL LOW (ref >90)
GFR, EST NON AFRICAN AMERICAN: 23 mL/min — AB (ref >90)
Glucose, Bld: 54 mg/dL — ABNORMAL LOW (ref 70–99)
Potassium: 4.7 mEq/L (ref 3.7–5.3)
Sodium: 143 mEq/L (ref 137–147)

## 2014-07-13 LAB — CBC
HCT: 30.2 % — ABNORMAL LOW (ref 36.0–46.0)
HEMATOCRIT: 23.4 % — AB (ref 36.0–46.0)
HEMOGLOBIN: 10.1 g/dL — AB (ref 12.0–15.0)
Hemoglobin: 7.6 g/dL — ABNORMAL LOW (ref 12.0–15.0)
MCH: 27.5 pg (ref 26.0–34.0)
MCH: 28.7 pg (ref 26.0–34.0)
MCHC: 32.5 g/dL (ref 30.0–36.0)
MCHC: 33.4 g/dL (ref 30.0–36.0)
MCV: 84.8 fL (ref 78.0–100.0)
MCV: 85.8 fL (ref 78.0–100.0)
PLATELETS: ADEQUATE 10*3/uL (ref 150–400)
Platelets: 230 10*3/uL (ref 150–400)
RBC: 2.76 MIL/uL — AB (ref 3.87–5.11)
RBC: 3.52 MIL/uL — ABNORMAL LOW (ref 3.87–5.11)
RDW: 16.3 % — ABNORMAL HIGH (ref 11.5–15.5)
RDW: 15.7 % — ABNORMAL HIGH (ref 11.5–15.5)
WBC: 6.6 10*3/uL (ref 4.0–10.5)
WBC: 11 10*3/uL — AB (ref 4.0–10.5)

## 2014-07-13 LAB — GLUCOSE, CAPILLARY
GLUCOSE-CAPILLARY: 61 mg/dL — AB (ref 70–99)
GLUCOSE-CAPILLARY: 269 mg/dL — AB (ref 70–99)
Glucose-Capillary: 129 mg/dL — ABNORMAL HIGH (ref 70–99)
Glucose-Capillary: 100 mg/dL — ABNORMAL HIGH (ref 70–99)
Glucose-Capillary: 104 mg/dL — ABNORMAL HIGH (ref 70–99)
Glucose-Capillary: 114 mg/dL — ABNORMAL HIGH (ref 70–99)

## 2014-07-13 LAB — BLOOD GAS, ARTERIAL
ACID-BASE DEFICIT: 1.1 mmol/L (ref 0.0–2.0)
BICARBONATE: 22.7 meq/L (ref 20.0–24.0)
Drawn by: 24485
O2 Saturation: 95.4 %
PATIENT TEMPERATURE: 98.6
TCO2: 23.8 mmol/L (ref 0–100)
pCO2 arterial: 35.6 mmHg (ref 35.0–45.0)
pH, Arterial: 7.422 (ref 7.350–7.450)
pO2, Arterial: 77.8 mmHg — ABNORMAL LOW (ref 80.0–100.0)

## 2014-07-13 LAB — PREPARE RBC (CROSSMATCH)

## 2014-07-13 SURGERY — ENTEROSCOPY
Anesthesia: Monitor Anesthesia Care

## 2014-07-13 MED ORDER — MORPHINE SULFATE 2 MG/ML IJ SOLN
INTRAMUSCULAR | Status: AC
  Filled 2014-07-13: qty 1

## 2014-07-13 MED ORDER — DEXTROSE 50 % IV SOLN: 25.0000 mL | Freq: Once | INTRAVENOUS | Status: AC | PRN

## 2014-07-13 MED ORDER — MORPHINE SULFATE 2 MG/ML IJ SOLN: 0.5000 mg | INTRAMUSCULAR | Status: DC | PRN

## 2014-07-13 MED ORDER — GLUCOSE 40 % PO GEL
ORAL | Status: AC
  Filled 2014-07-13: qty 1

## 2014-07-13 MED ORDER — DEXTROSE 50 % IV SOLN
INTRAVENOUS | Status: AC
  Administered 2014-07-13: 25 mL
  Filled 2014-07-13: qty 50

## 2014-07-13 MED ORDER — SODIUM CHLORIDE 0.9 % IV SOLN
Freq: Once | INTRAVENOUS | Status: AC
  Administered 2014-07-13: 08:00:00 via INTRAVENOUS

## 2014-07-13 MED ORDER — FUROSEMIDE 10 MG/ML IJ SOLN
INTRAMUSCULAR | Status: AC
  Filled 2014-07-13: qty 4

## 2014-07-13 MED ORDER — MORPHINE SULFATE 2 MG/ML IJ SOLN: 1.0000 mg | INTRAMUSCULAR | Status: DC | PRN

## 2014-07-13 MED ORDER — FUROSEMIDE 10 MG/ML IJ SOLN
20.0000 mg | Freq: Once | INTRAMUSCULAR | Status: AC
  Administered 2014-07-13: 20 mg via INTRAVENOUS

## 2014-07-13 MED ORDER — HYDROCODONE-ACETAMINOPHEN 5-325 MG PO TABS
1.0000 | ORAL_TABLET | Freq: Four times a day (QID) | ORAL | Status: DC | PRN
  Administered 2014-07-14 (×2): 1 via ORAL
  Filled 2014-07-13 (×2): qty 1

## 2014-07-13 MED ORDER — SODIUM CHLORIDE 0.9 % IV SOLN
INTRAVENOUS | Status: DC | PRN
  Administered 2014-07-13: 13:00:00 via INTRAVENOUS

## 2014-07-13 MED ORDER — PROPOFOL INFUSION 10 MG/ML OPTIME
INTRAVENOUS | Status: DC | PRN
  Administered 2014-07-13: 50 ug/kg/min via INTRAVENOUS

## 2014-07-13 NOTE — Consult Note (Signed)
NEURO HOSPITALIST CONSULT NOTE    Reason for Consult: transient AMS  HPI:                                                                                                                                          [name] is an 80 y.o. female who is followed as a out patient by Dr. Leonie Man due to recent CVA's.  She had hospitalization from 02/04/2014 to 02/10/2014 and again on 04/29/2014 for an acute stroke, possible watershed infarct versus embolic strokes. She has history of atrial fibrillation (recent event monitors did not show A. Fib it did show some PVCs and short bursts of nonsustained ventricular tachycardia.) recent NSTEMI with outpatient evaluation by cardiology during the stress test done on 0/25/8527, combined systolic and diastolic heart failure (ejection fraction of 78-24%, grade 2 diastolic dysfunction on 2/35/3614). She has been on anticoagulation in past but was taken off this due to GI bleed. Since then she has been on ASA. Patient was again admitted to hospital on 07-11-2014 due to generalized weakness and GIB. Early this AM patient was noted to have decreased interaction which was a gradual onset. BG was taken and showed to be 61 (54 at 6:30 AM).  Patient was given D50 with increased interaction.  Patient has a known GIB and anemia which she is being transfused with PRBC. Staff also noted twitching of her left face (whcih husband stated is not new).  In discussing with family and bedside nurse, they did not see any facial twitching during even or currently. Neurology was asked to see regarding the transient decreased mental status and left facial twitching.    Past Medical History  Diagnosis Date  . Asthma   . CAD (coronary artery disease)     Pt reports MI in 2006 (no documentation).  Cardiolite in 05/2002 and 07/2006 did not reveal any reversible ischemia.  Pt follows with Dr. Rex Kras at [facility].  . CHF (congestive heart failure)     EF 25-30% with dilated LV,  mild LVH, severe hypokinesis, and mod-severe reduction in RV function  . Osteoporosis   . HYPERTENSION 08/03/2006  . GASTROPARESIS, DIABETIC 08/03/2006  . HYPERLIPIDEMIA 08/03/2006  . OBSTRUCTIVE SLEEP APNEA 01/06/2008  . PERIPHERAL NEUROPATHY 08/03/2006  . GERD 08/03/2006  . LOW BACK PAIN, CHRONIC 08/03/2006  . OSTEOPOROSIS 03/21/2009  . CEREBRAL EMBOLISM, WITH INFARCTION 07/02/2010  . Angina   . Myocardial infarction "2 or 3"  . Pneumonia 02/26/12    "a few times; probably even today"  . Shortness of breath     "all the time"  . DIABETES MELLITUS, TYPE II 11/04/1983  . Blood transfusion 08/2011  . Lower GI bleeding 08/2011 and 01/2014  . Chronic daily headache   . Migraines   .  Stroke summer 2011    "made my left hip worse"  . Uterine cancer   . Nonischemic cardiomyopathy 05/2010    Left heart catheterization:2011. Nonobstructive coronary artery disease.  . Pulmonary hypertension   . Hypertension   . NSTEMI (non-ST elevated myocardial infarction) 01/2014    type II with CVA  . Acute embolic stroke 8/33/8250  . Non-ST elevation MI (NSTEMI) 02/04/2014  . E. coli UTI 03/02/2014  . Renal failure, acute 02/04/2014  . Iron deficiency anemia secondary to blood loss (chronic) 01/06/2008    Qualifier: Diagnosis of  By: Tomasa Hosteller MD, Veronique D.   . Nonsustained ventricular tachycardia   . Angiodysplasia of intestine with hemorrhage 05/13/2014    Past Surgical History  Procedure Laterality Date  . Esophagogastroduodenoscopy  08/26/2011    Procedure: ESOPHAGOGASTRODUODENOSCOPY (EGD);  Surgeon: [name], MD;  Location: [facility] ENDOSCOPY;  Service: Endoscopy;  Laterality: N/A;  . Colonoscopy  08/28/2011    Procedure: COLONOSCOPY;  Surgeon: [name], MD;  Location: [place];  Service: Endoscopy;  Laterality: N/A;  . Vaginal hysterectomy    . Tubal ligation    . Cataract extraction w/ intraocular lens  implant, bilateral    . Toe surgery      "right big toe; operated on it to  straighten it out; it was under"  . Polysomnogram  10/17/2005    AHI-7.28/hr. AHI REM-20.8/hr. Average oxygen saturation range during REM and NREM was 97%. Lowest oxygen saturation during REM sleep was 90%.  . Carotid duplex  05/28/2010    No significant extracranial carotid artery stenosis demonstrated. Vertebrals are patent w/ antegrade flow.  . Cardiac catheterization  05/24/2010    No intervention - recommed medical therapy.  . Cardiovascular stress test  08/07/2006    Moderate-severe defect seen in Basal inferior, Mid inferoseptal, Mid inferior, Mid inferolateral, and Apical inferior regions - consistent w/ infarct/scar. No scintigraphic evidence of inducible myocardial ischemia.  . Transthoracic echocardiogram  08/29/2011    EF 55-60%, moderate LVH,   . Esophagogastroduodenoscopy N/A 02/08/2014    Procedure: ESOPHAGOGASTRODUODENOSCOPY (EGD);  Surgeon: [name], MD;  Location: [facility] ENDOSCOPY;  Service: Endoscopy;  Laterality: N/A;  . Colonoscopy N/A 05/12/2014    Procedure: COLONOSCOPY;  Surgeon: [name], MD;  Location: [place];  Service: Endoscopy;  Laterality: N/A;  . Enteroscopy N/A 05/13/2014    Procedure: ENTEROSCOPY;  Surgeon: [name], MD;  Location: [place];  Service: Endoscopy;  Laterality: N/A;    Family History  Problem Relation Age of Onset  . Diabetes insipidus Mother   . Hypertension Mother   . Hypertension Father   . Hypertension Sister   . Hypertension Child   . Stomach cancer Brother      Social History:  reports that she has never smoked. She quit smokeless tobacco use about 7 years ago. Her smokeless tobacco use included Snuff. She reports that she does not drink alcohol or use illicit drugs.  Allergies  Allergen Reactions  . Baking Soda-Fluoride [Sodium Fluoride] Nausea And Vomiting  . Magnesium Hydroxide Nausea And Vomiting    MEDICATIONS:  Prior to Admission:  Prescriptions prior to admission  Medication Sig Dispense Refill  . acetaminophen (TYLENOL) 500 MG tablet Take 500 mg by mouth every 6 (six) hours as needed for moderate pain or headache.      . albuterol (PROAIR HFA) 108 (90 BASE) MCG/ACT inhaler Inhale 2 puffs into the lungs every 6 (six) hours as needed for wheezing.  8.5 g  11  . amLODipine (NORVASC) 5 MG tablet Take 1 tablet (5 mg total) by mouth daily.  30 tablet  1  . aspirin 81 MG tablet Take 1 tablet (81 mg total) by mouth daily.  30 tablet    . atorvastatin (LIPITOR) 40 MG tablet Take 1 tablet (40 mg total) by mouth daily at 6 PM.  30 tablet  11  . ferrous sulfate 325 (65 FE) MG tablet Take 1 tablet (325 mg total) by mouth 2 (two) times daily with a meal.  60 tablet  5  . insulin glargine (LANTUS) 100 UNIT/ML injection 5 units in the a.m. and 8 units each bedtime  10 mL  11  . isosorbide mononitrate (IMDUR) 60 MG 24 hr tablet Take 1 tablet (60 mg total) by mouth daily.  30 tablet  2  . lisinopril (PRINIVIL,ZESTRIL) 5 MG tablet       . loratadine (ALLERGY RELIEF) 10 MG tablet Take 10 mg by mouth daily.      . magnesium oxide (MAG-OX) 400 (241.3 MG) MG tablet Take 1 tablet (400 mg total) by mouth 2 (two) times daily.  60 tablet  1  . metoprolol succinate (TOPROL-XL) 12.5 mg TB24 24 hr tablet Take 0.5 tablets (12.5 mg total) by mouth daily.  30 tablet  1  . Multiple Vitamins-Minerals (ALIVE WOMENS 50+ PO) Take 1 tablet by mouth daily.      Marland Kitchen omeprazole (PRILOSEC) 20 MG capsule Take 1 capsule (20 mg total) by mouth daily before breakfast.  30 capsule  11  . Phenylephrine-DM-GG (TUSSIN CF COUGH & COLD PO) Take by mouth as needed.      . polyethylene glycol (MIRALAX / GLYCOLAX) packet Take 17 g by mouth daily.       Scheduled: . atorvastatin  40 mg Oral q1800  . ferrous sulfate  325 mg Oral BID WC  . insulin aspart  0-9 Units Subcutaneous TID WC  . isosorbide mononitrate  60 mg Oral Daily  .  metoprolol succinate  12.5 mg Oral Daily  . morphine      . pantoprazole (PROTONIX) IV  40 mg Intravenous Q12H  . polyethylene glycol  17 g Oral Daily     ROS:                                                                                                                                       History obtained from the patient  General ROS: negative for - chills, fatigue, fever, night sweats, weight  gain or weight loss Psychological ROS: negative for - behavioral disorder, hallucinations, memory difficulties, mood swings or suicidal ideation Ophthalmic ROS: negative for - blurry vision, double vision, eye pain or loss of vision ENT ROS: negative for - epistaxis, nasal discharge, oral lesions, sore throat, tinnitus or vertigo Allergy and Immunology ROS: negative for - hives or itchy/watery eyes Hematological and Lymphatic ROS: negative for - bleeding problems, bruising or swollen lymph nodes Endocrine ROS: negative for - galactorrhea, hair pattern changes, polydipsia/polyuria or temperature intolerance Respiratory ROS: negative for - cough, hemoptysis, shortness of breath or wheezing Cardiovascular ROS: negative for - chest pain, dyspnea on exertion, edema or irregular heartbeat Gastrointestinal ROS: negative for - abdominal pain, diarrhea, hematemesis, nausea/vomiting or stool incontinence Genito-Urinary ROS: negative for - dysuria, hematuria, incontinence or urinary frequency/urgency Musculoskeletal ROS: negative for - joint swelling or muscular weakness Neurological ROS: as noted in HPI Dermatological ROS: negative for rash and skin lesion changes   Blood pressure 156/63, pulse 74, temperature 98.3 F (36.8 C), temperature source Oral, resp. rate 21, height 5\' 2"  (1.575 m), weight 64.4 kg (141 lb 15.6 oz), last menstrual period 12/19/1968, SpO2 100.00%.   Neurologic Examination:                                                                                                       General: NAD Mental Status: Alert, oriented, thought content appropriate.  Speech fluent without evidence of aphasia.  Able to follow 3 step commands without difficulty. Cranial Nerves: II: Discs flat bilaterally; Visual fields grossly normal, pupils equal, round, reactive to light and accommodation III,IV, VI: ptosis not present, extra-ocular motions intact bilaterally V,VII: smile symmetric, facial light touch sensation normal bilaterally VIII: hearing normal bilaterally IX,X: gag reflex present XI: bilateral shoulder shrug XII: midline tongue extension without atrophy or fasciculations  Motor: Right : Upper extremity   5/5    Left:     Upper extremity   5/5  Lower extremity   5/5     Lower extremity   5/5 Tone and bulk:normal tone throughout; no atrophy noted Sensory: Pinprick and light touch intact throughout, bilaterally Deep Tendon Reflexes:  1+ throughout  Plantars: Mute bilaterally Cerebellar: Dysmetric bilateral finger-to-nose,  normal heel-to-shin test Gait: not tested CV: pulses palpable throughout    Lab Results: Basic Metabolic Panel:  Recent Labs Lab 07/11/14 1855 07/12/14 0146 07/13/14 0630  NA 137 140 143  K 4.5 4.1 4.7  CL 102 106 109  CO2 21 23 25   GLUCOSE 223* 192* 54*  BUN 46* 47* 40*  CREATININE 1.78* 1.83* 1.98*  CALCIUM 9.3 8.9 9.0    Liver Function Tests:  Recent Labs Lab 07/12/14 0146  AST 9  ALT 12  ALKPHOS 83  BILITOT <0.2*  PROT 6.4  ALBUMIN 2.8*   No results found for this basename: LIPASE, AMYLASE,  in the last 168 hours No results found for this basename: AMMONIA,  in the last 168 hours  CBC:  Recent Labs Lab 07/11/14 1855 07/12/14 0146 07/12/14 0850 07/12/14 1246 07/12/14 1800 07/13/14 0630  WBC  6.5 5.6 5.9 6.1 6.4 6.6  NEUTROABS 4.7 3.2  --   --   --   --   HGB 7.5* 6.0* 8.1* 8.0* 7.8* 7.6*  HCT 23.6* 18.3* 23.9* 24.3* 23.5* 23.4*  MCV 85.5 83.2 87.9 87.1 85.8 84.8  PLT 242 211 212 209 188 PLATELET CLUMPS  NOTED ON SMEAR, COUNT APPEARS ADEQUATE    Cardiac Enzymes:  Recent Labs Lab 07/11/14 2113 07/12/14 0146 07/12/14 0845  TROPONINI <0.30 <0.30 <0.30    Lipid Panel: No results found for this basename: CHOL, TRIG, HDL, CHOLHDL, VLDL, LDLCALC,  in the last 168 hours  CBG:  Recent Labs Lab 07/12/14 2115 07/13/14 0744 07/13/14 0853 07/13/14 1045 07/13/14 1127  GLUCAP 104* 61* 129* 100* 104*    Microbiology: Results for orders placed during the hospital encounter of 07/11/14  MRSA PCR SCREENING     Status: None   Collection Time    07/12/14 12:19 AM      Result Value Ref Range Status   MRSA by PCR NEGATIVE  NEGATIVE Final   Comment:            The GeneXpert MRSA Assay (FDA     approved for NASAL specimens     only), is one component of a     comprehensive MRSA colonization     surveillance program. It is not     intended to diagnose MRSA     infection nor to guide or     monitor treatment for     MRSA infections.    Coagulation Studies:  Recent Labs  07/11/14 1855  LABPROT 15.6*  INR 1.24    Imaging: Dg Chest 2 View  07/11/2014   CLINICAL DATA:  Extreme shortness of breath for several days. History of heart disease, diabetes and hypertension.  EXAM: CHEST  2 VIEW  COMPARISON:  Radiographs 02/04/2014 and 05/10/2014. Abdominal CT 02/05/2014.  FINDINGS: There is cardiomegaly with vascular congestion. Compared with the prior studies, there are increased patchy opacities at both lung bases and increased fissural thickening. There is no consolidation or significant pleural effusion. The osseous structures appear stable.  IMPRESSION: Cardiomegaly with increased vascular congestion and patchy bibasilar opacities, likely congestive heart failure. No focal airspace disease.   Electronically Signed   By: Camie Patience M.D.   On: 07/11/2014 17:56   Dg Abd Portable 1v  07/13/2014   CLINICAL DATA:  LEFT lower quadrant pain, anemia at presentation on 07/11/2014, recent stroke,  progressive weakness, dark colored stools question GI bleed ; history CHF, stroke, hypertension, acute renal insufficiency, diabetes, atrial fibrillation, coronary artery disease post MI, initial encounter  EXAM: PORTABLE ABDOMEN - 1 VIEW  COMPARISON:  Portable exam 1028 hr compared to CT abdomen and pelvis 02/05/2014  FINDINGS: Nonobstructive bowel gas pattern.  No bowel dilatation or bowel wall thickening.  Scattered atherosclerotic calcifications.  No acute osseous findings or definite urinary tract calcification.  Degenerative changes LEFT hip.  Bibasilar atelectasis.  IMPRESSION: Nonobstructive bowel gas pattern.   Electronically Signed   By: Lavonia Dana M.D.   On: 07/13/2014 10:53       Assessment and plan per attending neurologist  Etta Quill PA-C [facility] [phone]  07/13/2014, 3:17 PM   Assessment/Plan: 80 year old female with transient decreased interaction which improved after glucose administration. Given the concern for facial twitching, an EEG would be reasonable, but I am not certain that this represents seizure. The episode today, especially given the improvement with glucose, is not clear that  it was a seizure.  1) EEG 2) will follow.   Roland Rack, MD [facility] [phone]  If 7pm- 7am, please page neurology on call as listed in [place].

## 2014-07-13 NOTE — Progress Notes (Signed)
     [facility] Progress Note  Subjective:  HgB 7.6 his a.m. 1 unit blood ordered earlier this morning by hospitalist service.For small bowel enteroscopy late today. Has had more dark stools. Has some left sided back/flank pain today. Says mattress in previous unit was more comfortable. No N/V/D. No BRBPR. Abd film and renal u/s ordered earlier this morning .   Objective:  Vital signs in last 24 hours: Temp:  [97.9 F (36.6 C)-98.3 F (36.8 C)] 98.2 F (36.8 C) (10/01 0609) Pulse Rate:  [62-73] 62 (10/01 0609) Resp:  [13-24] 18 (10/01 0609) BP: (134-164)/(55-89) 137/59 mmHg (10/01 0609) SpO2:  [99 %-100 %] 100 % (10/01 0609) Weight:  [150 lb 14.4 oz (68.448 kg)] 150 lb 14.4 oz (68.448 kg) (10/01 0609) Last BM Date: 07/11/14 General:   Alert,  Well-developed,    in NAD Heart:  Regular rate and rhythm; no murmurs Pulm lungs clear to ausc bilat Abdomen:  Soft, nontender and nondistended. Normal bowel sounds, without guarding, and without rebound.  Mild left CVAT. Extremities:  Without edema. Neurologic:  Alert and  oriented x4;  grossly normal neurologically. Psych:  Alert and cooperative. Normal mood and affect.  Intake/Output from previous day: 09/30 0701 - 10/01 0700 In: 480 [P.O.:480] Out: 300 [Urine:300] Intake/Output this shift:    Lab Results:  Recent Labs  07/12/14 1246 07/12/14 1800 07/13/14 0630  WBC 6.1 6.4 6.6  HGB 8.0* 7.8* 7.6*  HCT 24.3* 23.5* 23.4*  PLT 209 188 PLATELET CLUMPS NOTED ON SMEAR, COUNT APPEARS ADEQUATE   BMET  Recent Labs  07/11/14 1855 07/12/14 0146 07/13/14 0630  NA 137 140 143  K 4.5 4.1 4.7  CL 102 106 109  CO2 21 23 25   GLUCOSE 223* 192* 54*  BUN 46* 47* 40*  CREATININE 1.78* 1.83* 1.98*  CALCIUM 9.3 8.9 9.0   LFT  Recent Labs  07/12/14 0146  PROT 6.4  ALBUMIN 2.8*  AST 9  ALT 12  ALKPHOS 83  BILITOT <0.2*   PT/INR  Recent Labs  07/11/14 1855  LABPROT 15.6*  INR 1.24   Hepatitis Panel No  results found for this basename: HEPBSAG, HCVAB, HEPAIGM, HEPBIGM,  in the last 72 hours  Dg Chest 2 View  07/11/2014   CLINICAL DATA:  Extreme shortness of breath for several days. History of heart disease, diabetes and hypertension.  EXAM: CHEST  2 VIEW  COMPARISON:  Radiographs 02/04/2014 and 05/10/2014. Abdominal CT 02/05/2014.  FINDINGS: There is cardiomegaly with vascular congestion. Compared with the prior studies, there are increased patchy opacities at both lung bases and increased fissural thickening. There is no consolidation or significant pleural effusion. The osseous structures appear stable.  IMPRESSION: Cardiomegaly with increased vascular congestion and patchy bibasilar opacities, likely congestive heart failure. No focal airspace disease.   Electronically Signed   By: Camie Patience M.D.   On: 07/11/2014 17:56    ASSESSMENT/PLAN:  80 yo female with heme pos stool and recurrent anemia with a hx of small bowel AVM. Pt for small bowel enteroscopy today. If not able to locate angioectasias with push enteroscope, may need pill cam or oral iron and replacement transfusions prn.   2. Flank pain--u/s and abd films pending.. 3. Afib--not anticoag due to GI bleed     LOS: 2 days   Jernard Reiber, Vita Barley PA-C 07/13/2014, Pager [phone]

## 2014-07-13 NOTE — Op Note (Signed)
[facility] [place], [id number]   ENTEROSCOPY PROCEDURE REPORT     EXAM DATE: 07/13/2014  PATIENT NAME:      [name], [name]           MR #:      [id number]  BIRTHDATE:       18-Dec-1932      VISIT #:     [phone]  ATTENDING:     Jerene Bears, MD     STATUS:     inpatient ASSISTANT:      Verlon Au and Shoffner, Trevor Mace MD: [facility] ASA CLASS:        Class III  INDICATIONS:  The patient is a 80 yr old female here for an enteroscopy procedure due to anemia and heme + stool, melena. PROCEDURE PERFORMED:     Small bowel enteroscopy with ablation therapy  MEDICATIONS:     Monitored anesthesia care and Per Anesthesia  CONSENT: The patient understands the risks and benefits of the procedure and understands that these risks include, but are not limited to: sedation, allergic reaction, infection, perforation and/or bleeding. Alternative means of evaluation and treatment include, among others: physical exam, x-rays, and/or surgical intervention. The patient elects to proceed with this endoscopic procedure.  DESCRIPTION OF PROCEDURE: During intra-op preparation period all mechanical & medical equipment was checked for proper function. Hand hygiene and appropriate measures for infection prevention was taken. After the risks, benefits and alternatives of the procedure were thoroughly explained, Informed consent was verified, confirmed and timeout was successfully executed by the treatment team. The [id number] ([id number]) endoscope was introduced through the mouth and advanced to the proximal jejunum. The prep was good. The instrument was then slowly withdrawn while examining the mucosa circumferentially. The scope was then completely withdrawn from the patient and the procedure terminated. The pulse, BP, and O2 saturation were monitored and documented by the physician and the nursing staff throughout the entire  procedure.  The patient was cared for as planned according to standard protocol, then discharged to recovery in stable condition and with appropriate post procedure care.  ESOPHAGUS: The mucosa of the esophagus appeared normal.  STOMACH: There was mild non-bleeding gastropathy on the greater curvature of the gastric body.   The stomach otherwise appeared normal.  DUODENUM: The duodenal mucosa showed no abnormalities in the entire duodenum.  JEJUNUM: Two small angiodysplastic lesions with no active bleeding were found in the proximal jejunum.  Argon plasma coagulation was applied to the sites with good treatment effect.  No other lesions seen.  No evidence of blood or old blood in the entire examined GI tract today.    ADVERSE EVENTS:      There were no immediate complications.  IMPRESSIONS:     1.  The mucosa of the esophagus appeared normal 2.  There was mild non-bleeding gastropathy on the greater curvature of the gastric body 3.  The stomach otherwise appeared normal 4.  The duodenal mucosa showed no abnormalities in the entire duodenum 5.  Angiodysplastic lesion with no bleeding was found in the proximal jejunum; Argon plasma coagulation was applied to the site(s); with good treatment effect  RECOMMENDATIONS:     1.  Monitor Hgb. Check iron studies, if low would give IV iron while here 2.  Would only pursue video capsule endoscopy if patient willing to undergo deep enteroscopy at tertiary care center.  In the meantime, she may require periodic blood transfusion.  Jerene Bears, MD eSigned:  Jerene Bears, MD 07/13/2014 2:10 PM   cc:  The Patient Silvano Rusk, MD     PATIENT NAME:  [name], [name] MR#: [id number]

## 2014-07-13 NOTE — Progress Notes (Signed)
Called to bedside to assist with care per RN Raquel Sarna and Dr. Frederic Jericho.  Staff reports patient with onset of decreased interaction = gradual onset.  Report patient had low blood sugar this AM - 61 - that was treated with D50.   Has low Hgb from recent GIB - now with PRBC infusing. Staff reports patient alert and interactive this AM - now weaker and little interaction.  On my arrival she is awake - weak looking - follows commands - MAE x 4 - speaking with whisper - oriented - son reports this is now her baseline. She does have some intermittent twitching of the left side of her face - does not progress - patient remains able to talk - son states she has had these at home recently - cannot correlate these twitches to her decreased LOC events at home.   She c/o acute pain left abdomen per staff this AM - now states it hurst a little  = abd soft - quiet BS - diffuse tenderness to palpitation.  Had BM last night - dark per report. Had KUB this AM - unremarkable.  Patient had recent stroke = son reports little left side weakness only deficit = not apprecited on this exam.  States she does walk.  She has equal bilateral leg weakness otherwise just weak.  RR reg and unlabored - bil BS = and clear.  ABG done - WNL.  Dr. Frederic Jericho present at bedside - S. Gibbons with GI present.  Order to return patient to SDU.  Transferred to 3S10.

## 2014-07-13 NOTE — Progress Notes (Signed)
Pt called RN to room stating "arm swelling." Pt arm swollen at IV site with apparent infiltration. NS running at 92ml/hr. Pt previously received D50 IV push.  Infusion stopped and IV removed. Pt reports no pain, numbness, tingling. Heat pack applied. Will continue to monitor. Lenna Sciara

## 2014-07-13 NOTE — Progress Notes (Signed)
Patient seen, examined, and I agree with the above documentation, including the assessment and plan. Plan for SBE today to eval anemia and heme + stools in the settting of prior small bowel angioectasias Mental status change earlier today has resolved.   She has been evaluated by anesthesia and deemed appropriate for sedation She is getting one additional unit of packed red cells, which is overall unit #2. Hemoglobin responded to first unit which is reassuring Flank pain of unclear cause, ? Musculoskeletal, but if continues consider imaging The nature of the procedure, as well as the risks, benefits, and alternatives were carefully and thoroughly reviewed with the patient. Ample time for discussion and questions allowed. The patient understood, was satisfied, and agreed to proceed.

## 2014-07-13 NOTE — Progress Notes (Signed)
TEAM 1 - Stepdown/ICU TEAM Progress Note  ARTHEA NOBEL WUJ:[id number] DOB: 04-21-1933 DOA: 07/11/2014 PCP: [name], MD  Admit HPI / Brief Narrative: 80 yo F with medical history of recent admission 9/56-2/1 for acute embolic CVA and GIB 2/2 small bowel AVM s/p clipping and NSTEMI who presented 929 with weakness, GIB, and an apparent acute on chronic CHF exacerbation. Per the family, pt was noted to be progressively weaker. Family also noticed pt with dark/maroon colored stools. Family/pt denied NSAID use apart from baby ASA.  Marland Kitchen  On presentation to the ER, BP was mildly elevated into 160s-170s. Otherwise hemodynamically stable. Hgb 7.5, Cr 1.78, BUN 46, glu 223. CXR noted cardiomegaly.    HPI/Subjective: Pt is resting comfortably at present with no c/o chest pain, abdom pain, or sob.  Assessment/Plan: Encephalopathy:  Patient became more lethargic, speaking less, decrease respond time.  She had twitching movement left side face muscle. Per son she has had this twitching movement at home.  CT head, ABG order.  Neurology consulted.  After blood transfusion initiated patient became more alert.  Transfer to step down for better monitor.  Patient early this morning CBG was 60, at time of AMS CBG was 100.   Abdominal pain:  Check KUB.   Recurrent GIB recent admission with small bowel AVM s/p clipping by GI 04/2014 - Hgb nadir of 6.0 - HOLD baby ASA - for repeat small bowel enteroscopy 10/1 - Colonoscopy performed recently was unremarkable Transfuse i unit of PRBC.  Cycle HB every 8 hours.   Acute / subacute blood loss anemia Hgb nadir of 6.0 - s/p 1U PRBC - follow Hgb in serial fashion - goal is to keep Hgb 8.0 or >  Hb at 7, will transfuse one unit.   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  Check renal US.   DM 2 CBG reasonably controlled - follow trend  Episode of hypoglycemia.  Hold lantus. SSI.   HTN BP poorly controlled - adjust tx and follow trend  Continue with Imdur, metoprolol,   HLD Cont home medical tx    Code Status: DNR Family Communication: son at bedside.  Disposition Plan: transfer to step down.   Consultants: GI  Procedures: none  Antibiotics: none  DVT prophylaxis: SCDs  Objective: Blood pressure 169/66, pulse 66, temperature 98.4 F (36.9 C), temperature source Oral, resp. rate 18, height 5\' 2"  (1.575 m), weight 68.448 kg (150 lb 14.4 oz), last menstrual period 12/19/1968, SpO2 100.00%.  Intake/Output Summary (Last 24 hours) at 07/13/14 1103 Last data filed at 07/13/14 1054  Gross per 24 hour  Intake    510 ml  Output      0 ml  Net  510 ml   Exam: General: No acute respiratory distress Lungs: Clear to auscultation bilaterally without wheezes or crackles Cardiovascular: Regular rate and rhythm without murmur gallop or rub normal S1 and S2 Abdomen:  nondistended, soft, bowel sounds positive, no rebound, no ascites, no appreciable mass, mild left side tenderness.  Extremities: No significant cyanosis, clubbing, or edema bilateral lower extremities  Data Reviewed: Basic Metabolic Panel:  Recent Labs Lab 07/11/14 1855 07/12/14 0146 07/13/14 0630  NA 137 140 143  K 4.5 4.1 4.7  CL  102 106 109  CO2 21 23 25   GLUCOSE 223* 192* 54*  BUN 46* 47* 40*  CREATININE 1.78* 1.83* 1.98*  CALCIUM 9.3 8.9 9.0   Liver Function Tests:  Recent Labs Lab 07/12/14 0146  AST 9  ALT 12  ALKPHOS 83  BILITOT <0.2*  PROT 6.4  ALBUMIN 2.8*   Coags:  Recent Labs Lab 07/11/14 1855  INR 1.24   CBC:  Recent Labs Lab 07/11/14 1855 07/12/14 0146 07/12/14 0850 07/12/14 1246 07/12/14 1800 07/13/14 0630  WBC 6.5 5.6 5.9 6.1 6.4 6.6  NEUTROABS 4.7 3.2  --   --   --   --   HGB 7.5* 6.0* 8.1* 8.0* 7.8* 7.6*  HCT 23.6* 18.3* 23.9* 24.3* 23.5* 23.4*  MCV 85.5 83.2 87.9 87.1 85.8 84.8  PLT 242 211 212 209 188 PLATELET CLUMPS NOTED ON SMEAR, COUNT APPEARS ADEQUATE    Cardiac Enzymes:  Recent Labs Lab 07/11/14 2113 07/12/14 0146 07/12/14 0845  TROPONINI <0.30 <0.30 <0.30   BNP (last 3 results)  Recent Labs  12/05/13 0448 05/10/14 0736 07/11/14 1855  PROBNP 6023.0* 14464.0* 6841.0*    CBG:  Recent Labs Lab 07/12/14 1733 07/12/14 2115 07/13/14 0744 07/13/14 0853 07/13/14 1045  GLUCAP 211* 104* 61* 129* 100*    Recent Results (from the past 240 hour(s))  MRSA PCR SCREENING     Status: None   Collection Time    07/12/14 12:19 AM      Result Value Ref Range Status   MRSA by PCR NEGATIVE  NEGATIVE Final   Comment:            The GeneXpert MRSA Assay (FDA     approved for NASAL specimens     only), is one component of a     comprehensive MRSA colonization     surveillance program. It is not     intended to diagnose MRSA     infection nor to guide or     monitor treatment for     MRSA infections.     Studies:  Recent x-ray studies have been reviewed in detail by the Attending Physician  Scheduled Meds:  Scheduled Meds: . atorvastatin  40 mg Oral q1800  . ferrous sulfate  325 mg Oral BID WC  . furosemide  20 mg Intravenous Once  . insulin aspart  0-9 Units Subcutaneous TID WC  . isosorbide mononitrate  60 mg Oral Daily  . metoprolol  succinate  12.5 mg Oral Daily  . morphine      . pantoprazole (PROTONIX) IV  40 mg Intravenous Q12H  . polyethylene glycol  17 g Oral Daily    Time spent on care of this patient: 35 mins   Elmarie Shiley , MD  [place]  [phone] Pager - Text Page per Amion as per below:  On-Call/Text Page:      [email or web address]      password [email or web address]  If 7PM-7AM, please  contact night-coverage [email or web address] Password [email or web address] 07/13/2014, 11:02 AM   LOS: 2 days

## 2014-07-13 NOTE — Anesthesia Preprocedure Evaluation (Addendum)
Anesthesia Evaluation    Airway       Dental   Pulmonary shortness of breath, asthma , sleep apnea ,          Cardiovascular hypertension, + CAD, + Past MI, + Peripheral Vascular Disease and +CHF     Neuro/Psych  Neuromuscular disease CVA    GI/Hepatic GERD-  ,GI hemorrhage   Endo/Other  diabetes  Renal/GU Renal InsufficiencyRenal disease     Musculoskeletal   Abdominal   Peds  Hematology  (+) anemia ,   Anesthesia Other Findings   Reproductive/Obstetrics                          Anesthesia Physical Anesthesia Plan  ASA: IV  Anesthesia Plan: MAC   Post-op Pain Management:    Induction: Intravenous  Airway Management Planned: Natural Airway  Additional Equipment:   Intra-op Plan:   Post-operative Plan:   Informed Consent: I have reviewed the patients History and Physical, chart, labs and discussed the procedure including the risks, benefits and alternatives for the proposed anesthesia with the patient or authorized representative who has indicated his/her understanding and acceptance.     Plan Discussed with:   Anesthesia Plan Comments:         Anesthesia Quick Evaluation

## 2014-07-13 NOTE — Progress Notes (Signed)
Pt called RN to room stating her arm was "swelling more."  Arm is more swollen where IV was removed.  No complaints of pain, numbness or tingling. Pt able to move extremiety and fingers. No redness. Pulses proximally and distally 2+. Area of infiltration marked. IV team notified with no new recommendations. Pt also states she has sudden onset abdominal pain 8/10.   Dr. Tyrell Antonio notified to assess. Will continue to monitor pt. Lenna Sciara

## 2014-07-13 NOTE — Progress Notes (Signed)
Pt transferred to 3S10 per MD order. Report given to receiving nurse, Archie Patten, RN. All belongings sent with pt. [name]

## 2014-07-13 NOTE — Transfer of Care (Signed)
Immediate Anesthesia Transfer of Care Note  Patient: [name]  Procedure(s) Performed: Procedure(s) with comments: small bowel enteroscopy (N/A) - requests slim colonoscope  Patient Location: Endoscopy Unit  Anesthesia Type:MAC  Level of Consciousness: sedated  Airway & Oxygen Therapy: Patient Spontanous Breathing and Patient connected to nasal cannula oxygen  Post-op Assessment: Report given to PACU RN, Post -op Vital signs reviewed and stable and Patient moving all extremities X 4  Post vital signs: Reviewed and stable  Complications: No apparent anesthesia complications

## 2014-07-13 NOTE — Progress Notes (Signed)
Pt had sudden onset of lethargy, decreased mental status and pain in left side.  This about 0900.  Blood sugar 100 BP  170/70   Pulse 60s. O2 sats 100%.  Respiratory just pulled blood for blood gas.  Dark stool last night, none since..  Hgb had dropped to 7.6 this AM.  8.0 yesterday. But had been 6.0 prior to 1 unit transfusion yesterday.  Just started on unit of PRBCs, ordered by Dr Tyrell Antonio this morning.  Blood had not yet started when above events began.   Her AMS has resolved, she is not having pain.   Has enteroscopy with MAC sedation set for noon.  As of now planning to proceed to this.   Cheryl Freed PA-c.

## 2014-07-14 ENCOUNTER — Inpatient Hospital Stay (HOSPITAL_COMMUNITY): Payer: Medicare Other

## 2014-07-14 ENCOUNTER — Encounter (HOSPITAL_COMMUNITY): Payer: Self-pay | Admitting: Internal Medicine

## 2014-07-14 DIAGNOSIS — D649 Anemia, unspecified: Secondary | ICD-10-CM

## 2014-07-14 DIAGNOSIS — E1165 Type 2 diabetes mellitus with hyperglycemia: Secondary | ICD-10-CM

## 2014-07-14 LAB — GLUCOSE, CAPILLARY
Glucose-Capillary: 226 mg/dL — ABNORMAL HIGH (ref 70–99)
Glucose-Capillary: 330 mg/dL — ABNORMAL HIGH (ref 70–99)
Glucose-Capillary: 343 mg/dL — ABNORMAL HIGH (ref 70–99)
Glucose-Capillary: 353 mg/dL — ABNORMAL HIGH (ref 70–99)

## 2014-07-14 LAB — IRON AND TIBC
Iron: 19 ug/dL — ABNORMAL LOW (ref 42–135)
Saturation Ratios: 7 % — ABNORMAL LOW (ref 20–55)
TIBC: 277 ug/dL (ref 250–470)
UIBC: 258 ug/dL (ref 125–400)

## 2014-07-14 LAB — TYPE AND SCREEN
ABO/RH(D): B POS
Antibody Screen: NEGATIVE
UNIT DIVISION: 0
Unit division: 0
Unit division: 0

## 2014-07-14 LAB — CBC
HCT: 29.6 % — ABNORMAL LOW (ref 36.0–46.0)
Hemoglobin: 9.8 g/dL — ABNORMAL LOW (ref 12.0–15.0)
MCH: 28.2 pg (ref 26.0–34.0)
MCHC: 33.1 g/dL (ref 30.0–36.0)
MCV: 85.3 fL (ref 78.0–100.0)
Platelets: 231 10*3/uL (ref 150–400)
RBC: 3.47 MIL/uL — ABNORMAL LOW (ref 3.87–5.11)
RDW: 15.9 % — AB (ref 11.5–15.5)
WBC: 11.3 10*3/uL — AB (ref 4.0–10.5)

## 2014-07-14 LAB — FERRITIN: Ferritin: 45 ng/mL (ref 10–291)

## 2014-07-14 LAB — BASIC METABOLIC PANEL
ANION GAP: 13 (ref 5–15)
BUN: 32 mg/dL — ABNORMAL HIGH (ref 6–23)
CO2: 22 meq/L (ref 19–32)
Calcium: 8.8 mg/dL (ref 8.4–10.5)
Chloride: 99 mEq/L (ref 96–112)
Creatinine, Ser: 1.84 mg/dL — ABNORMAL HIGH (ref 0.50–1.10)
GFR calc Af Amer: 29 mL/min — ABNORMAL LOW (ref >90)
GFR calc non Af Amer: 25 mL/min — ABNORMAL LOW (ref >90)
Glucose, Bld: 332 mg/dL — ABNORMAL HIGH (ref 70–99)
Potassium: 4.2 mEq/L (ref 3.7–5.3)
Sodium: 134 mEq/L — ABNORMAL LOW (ref 137–147)

## 2014-07-14 NOTE — Progress Notes (Addendum)
Daily Rounding Note  07/14/2014, 12:46 PM  LOS: 3 days   SUBJECTIVE:       Per family, pt is sharp today, asking about paying her bills.   Off floor for EEG as of 11 AM.  Spoke with family.  Now that she is back on floor and still c/o pain in Left flank.  No nausea.  BMs last PM 2030 "loose, black" per RN charting.  OBJECTIVE:         Vital signs in last 24 hours:    Temp:  [97.8 F (36.6 C)-99.7 F (37.6 C)] 98.5 F (36.9 C) (10/02 0752) Pulse Rate:  [66-80] 66 (10/02 0837) Resp:  [0-23] 13 (10/02 0837) BP: (150-179)/(54-80) 151/66 mmHg (10/02 0724) SpO2:  [97 %-100 %] 99 % (10/02 0837) Weight:  [142 lb 10.2 oz (64.7 kg)] 142 lb 10.2 oz (64.7 kg) (10/02 0444) Last BM Date: 07/12/14 Filed Weights   07/13/14 0609 07/13/14 1200 07/14/14 0444  Weight: 150 lb 14.4 oz (68.448 kg) 141 lb 15.6 oz (64.4 kg) 142 lb 10.2 oz (64.7 kg)   General: lethargic, comfortable.  Looks chronically ill   Heart: RRR Chest: clear bil.  Overall reduced BS (poor effort), no dyspnea or cough Abdomen: soft, NT, no distention.  Active BS, not tender.  No CVA tenderness  Extremities: no CCE Neuro/Psych:  Oriented x 3.  No tremor.  lethargic but arouseable  Intake/Output from previous day: 10/01 0701 - 10/02 0700 In: 3716 [P.O.:240; I.V.:470; Blood:485] Out: 1850 [Urine:1850]  Intake/Output this shift: Total I/O In: 520 [P.O.:480; I.V.:40] Out: -   Lab Results:  Recent Labs  07/13/14 0630 07/13/14 1915 07/14/14 0310  WBC 6.6 11.0* 11.3*  HGB 7.6* 10.1* 9.8*  HCT 23.4* 30.2* 29.6*  PLT PLATELET CLUMPS NOTED ON SMEAR, COUNT APPEARS ADEQUATE 230 231   BMET  Recent Labs  07/12/14 0146 07/13/14 0630 07/14/14 1119  NA 140 143 134*  K 4.1 4.7 4.2  CL 106 109 99  CO2 23 25 22   GLUCOSE 192* 54* 332*  BUN 47* 40* 32*  CREATININE 1.83* 1.98* 1.84*  CALCIUM 8.9 9.0 8.8   LFT  Recent Labs  07/12/14 0146  PROT 6.4  ALBUMIN  2.8*  AST 9  ALT 12  ALKPHOS 83  BILITOT <0.2*    Studies/Results: Ct Head Wo Contrast 07/13/2014  COMPARISON:  CT scan of May 10, 2014.  FINDINGS: Bony calvarium appears intact. Right frontal sinusitis is noted. Mild diffuse cortical atrophy is noted. Mild chronic ischemic white matter disease is noted. No mass effect or midline shift is noted. Ventricular size is within normal limits. There is no evidence of mass lesion, hemorrhage or acute infarction.  IMPRESSION: Mild diffuse cortical atrophy. Mild chronic ischemic white matter disease. Right frontal sinusitis. No other acute intracranial abnormality seen.   Electronically Signed   By: Sabino Dick M.D.   On: 07/13/2014 17:02   US Renal 07/13/2014     COMPARISON:  None.  FINDINGS: Right Kidney:  Length: 10.1 cm. Increased echogenicity is noted consistent with medical renal disease. No mass or hydronephrosis visualized.  Left Kidney:  Length: 10.1 cm. Increased echogenicity is noted consistent with medical renal disease. No mass or hydronephrosis visualized.  Bladder:  Appears normal for degree of bladder distention.  IMPRESSION: Increased echogenicity of renal parenchyma is noted bilaterally consistent with medical renal disease. No hydronephrosis or renal obstruction is noted.   Electronically Signed   By:  Sabino Dick M.D.   On: 07/13/2014 17:30   Dg Abd Portable 1v 07/13/2014    COMPARISON:  Portable exam 1028 hr compared to CT abdomen and pelvis 02/05/2014  FINDINGS: Nonobstructive bowel gas pattern.  No bowel dilatation or bowel wall thickening.  Scattered atherosclerotic calcifications.  No acute osseous findings or definite urinary tract calcification.  Degenerative changes LEFT hip.  Bibasilar atelectasis.  IMPRESSION: Nonobstructive bowel gas pattern.   Electronically Signed   By: Lavonia Dana M.D.   On: 07/13/2014 10:53    ASSESMENT:   * Melena, recurrent ABL anemia. Previous SB AVM ablation in 04/2014.  Gastritis on EGD in 2012.     Enteroscopy 07/13/14 with non-bleeding gastropathy. Non-bleeding proximal jejunum was ablated weth APC.  Hgb improved.  S/p 2 units PRBCs on 07/13/14.   *  Left flank pain.  Unimpressive KUB.  Suspect musculoskeletal pain.  *  CKD, ARF. Overall worsening of BUN/Creatinine. This may also be contributing to chronic anemia.   *  IDDM, type 2 DM.   *  Acute AMS. Small vessel brain changes. EEG in progress.  Family says pt's baseline is not demented, she is "sharp".   PLAN   *  Iron studies pending, if deficient should receive parenteral Iron prior to discharge.  *  Will need regular assays of Hgb/Hct as outpt, prn transfusions/parenteral iron.  Awaiting iron studies.      Hvozdovic, Vita Barley  Aug 12, 2014, 12:46 PM Pager: [phone]  Addendum: Patient seen, examined, and I agree with the above documentation, including the assessment and plan. Iron studies pending, would give IV iron before d/c if low Patient and family do not wish to pursue further small bowel testing (i.e. Video capsule and deep enteroscopy).  Would prefer to monitor hgb, replace iron as needed and transfuse when necessary to maintain hgb.  This is a reasonable approach given age and medical issues.  There is no evidence of brisk bleeding GI will sign off, call with questions

## 2014-07-14 NOTE — Progress Notes (Signed)
EEG Completed; Results Pending  

## 2014-07-14 NOTE — Progress Notes (Signed)
[facility] TEAM 1 - Stepdown/ICU TEAM Progress Note  [name] BOF:[id number] DOB: 1932/11/29 DOA: 07/11/2014 PCP: [name], MD  Admit HPI / Brief Narrative: 80 yo F with medical history of recent admission 7/78-2/4 for acute embolic CVA and GIB 2/2 small bowel AVM s/p clipping and NSTEMI who presented 929 with weakness, GIB, and an apparent acute on chronic CHF exacerbation. Per the family, pt was noted to be progressively weaker. Family also noticed pt with dark/maroon colored stools. Family/pt denied NSAID use apart from baby ASA.  Marland Kitchen  On presentation to the ER, BP was mildly elevated into 160s-170s. Otherwise hemodynamically stable. Hgb 7.5, Cr 1.78, BUN 46, glu 223. CXR noted cardiomegaly.    HPI/Subjective: Pt is alert in no distress. More alert. oriented to place and person. Denies abdominal pain.   Assessment/Plan: Encephalopathy:  Patient became more lethargic, speaking less, decrease respond time on 10-01.  She had twitching movement left side face muscle. Per son she has had this twitching movement at home.  CT head negative, ABG no hypercapnia.  Neurology consulted. EEG ordered.  After blood transfusion initiated patient became more alert.  Patient had an episode of hypoglycemia. CBG was 60, at time of AMS CBG was 100.   Abdominal pain:  KUB negative, resolved.   Recurrent GIB recent admission with small bowel AVM s/p clipping by GI 04/2014 - Hgb nadir of 6.0 - HOLD baby ASA Colonoscopy performed recently was unremarkable Transfuse i unit of PRBC.  Hb stable at 10.  S/P Enteroscopy: The mucosa of the esophagus appeared normal . 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 Hb at 9.8  Generalized weakness multifactorial with contributions from mild CHF exacerbation, symptomatic anemia - PT/OT to eval   Acute on chronic systolic and diastolic CHF  EF 23-53% and grade 2 diastolic dysfunction on 03/1442 ECHO - no evidence of significant volume overload at this time - follow Is/Os and daily weights   ?CAD S/p Low risk stress nuclear study with an inferior wall scar and mildly depressed LV systolic function July 1540  Hx of Afib currently maintaining NSR - not anticoagulation candidate due to GIB - follow on tele   Hx of embolic CVA April 0867 + July 2015 (acute right parietal infarcts) baseline L sided deficits   Acute kidney injury w/ Stage 4 CKD basline crt appears to be ~1.5-1.6 - follow w/ transfusion - hold ACE for now  Follow trend.  Renal US; No hydronephrosis or renal obstruction is noted.   DM 2 CBG reasonably controlled - follow trend  Episode of hypoglycemia.  Hold lantus. SSI.   HTN BP poorly controlled - adjust tx and follow trend  Continue with Imdur, metoprolol,   HLD Cont home medical tx    Code Status: DNR Family Communication: son at bedside.  Disposition Plan: transfer to  Med surgery bed.   Consultants: GI  Procedures: none  Antibiotics: none  DVT prophylaxis: SCDs  Objective: Blood pressure 154/61, pulse 73, temperature 98.5 F (36.9 C), temperature source Oral, resp.  rate 19, height 5\' 2"  (1.575 m), weight 64.7 kg (142 lb 10.2 oz), last menstrual period 12/19/1968, SpO2 97.00%.  Intake/Output Summary (Last 24 hours) at 07/14/14 0807 Last data filed at 07/14/14 0700  Gross per 24 hour  Intake   1195 ml  Output   1850 ml  Net   -655 ml   Exam: General: No acute respiratory distress Lungs: Clear to auscultation bilaterally without wheezes or crackles Cardiovascular: Regular  rate and rhythm without murmur gallop or rub normal S1 and S2 Abdomen:  nondistended, soft, bowel sounds positive, no rebound, no ascites, no appreciable mass, no tenderness.  Extremities: No significant cyanosis, clubbing, or edema bilateral lower extremities  Data Reviewed: Basic Metabolic Panel:  Recent Labs Lab 07/11/14 1855 07/12/14 0146 07/13/14 0630  NA 137 140 143  K 4.5 4.1 4.7  CL 102 106 109  CO2 21 23 25   GLUCOSE 223* 192* 54*  BUN 46* 47* 40*  CREATININE 1.78* 1.83* 1.98*  CALCIUM 9.3 8.9 9.0   Liver Function Tests:  Recent Labs Lab 07/12/14 0146  AST 9  ALT 12  ALKPHOS 83  BILITOT <0.2*  PROT 6.4  ALBUMIN 2.8*   Coags:  Recent Labs Lab 07/11/14 1855  INR 1.24   CBC:  Recent Labs Lab 07/11/14 1855 07/12/14 0146  07/12/14 1246 07/12/14 1800 07/13/14 0630 07/13/14 1915 07/14/14 0310  WBC 6.5 5.6  < > 6.1 6.4 6.6 11.0* 11.3*  NEUTROABS 4.7 3.2  --   --   --   --   --   --   HGB 7.5* 6.0*  < > 8.0* 7.8* 7.6* 10.1* 9.8*  HCT 23.6* 18.3*  < > 24.3* 23.5* 23.4* 30.2* 29.6*  MCV 85.5 83.2  < > 87.1 85.8 84.8 85.8 85.3  PLT 242 211  < > 209 188 PLATELET CLUMPS NOTED ON SMEAR, COUNT APPEARS ADEQUATE 230 231  < > = values in this interval not displayed.  Cardiac Enzymes:  Recent Labs Lab 07/11/14 2113 07/12/14 0146 07/12/14 0845  TROPONINI <0.30 <0.30 <0.30   BNP (last 3 results)  Recent Labs  12/05/13 0448 05/10/14 0736 07/11/14 1855  PROBNP 6023.0* 14464.0* 6841.0*    CBG:  Recent Labs Lab 07/13/14 0853 07/13/14 1045 07/13/14 1127 07/13/14 1601 07/13/14 2053  GLUCAP 129* 100* 104* 114* 269*    Recent Results (from the past 240 hour(s))  MRSA PCR SCREENING     Status: None   Collection Time    07/12/14 12:19 AM      Result Value Ref Range Status   MRSA by PCR NEGATIVE  NEGATIVE Final   Comment:            The GeneXpert MRSA Assay (FDA     approved for NASAL specimens     only), is one component of a      comprehensive MRSA colonization     surveillance program. It is not     intended to diagnose MRSA     infection nor to guide or     monitor treatment for     MRSA infections.     Studies:  Recent x-ray studies have been reviewed in detail by the Attending Physician  Scheduled Meds:  Scheduled Meds: . atorvastatin  40 mg Oral q1800  . ferrous sulfate  325 mg Oral BID WC  . insulin aspart  0-9 Units Subcutaneous TID WC  . isosorbide mononitrate  60 mg Oral Daily  . metoprolol succinate  12.5 mg Oral  Daily  . pantoprazole (PROTONIX) IV  40 mg Intravenous Q12H  . polyethylene glycol  17 g Oral Daily    Time spent on care of this patient: 35 mins   Elmarie Shiley , MD  [place]  [phone] Pager - Text Page per Amion as per below:  On-Call/Text Page:      [email or web address]      password [email or web address]  If 7PM-7AM, please contact night-coverage [email or web address] Password [email or web address] 07/14/2014, 8:07 AM   LOS: 3 days

## 2014-07-14 NOTE — Procedures (Signed)
History: 80 yo F with intermittent left facial twitching.   Sedation: None  Technique: This is a 17 channel routine scalp EEG performed at the bedside with bipolar and monopolar montages arranged in accordance to the international 10/20 system of electrode placement. One channel was dedicated to EKG recording.    Background: There is a well defined posterior dominant rhythm of 8 Hz that attenuates with eye opening. Background consists predominantly of alpha activity. There is an increase in delta associated with drowsiness.  Photic stimulation: Physiologic driving is not performed  EEG Abnormalities: None  Clinical Interpretation: This normal EEG is recorded in the waking and drowsy state. There was no seizure or seizure predisposition recorded on this study.   Roland Rack, MD [facility] [phone]  If 7pm- 7am, please page neurology on call as listed in [place].

## 2014-07-14 NOTE — Progress Notes (Addendum)
Pt tx 4O97, per MD order, pt tol well, pt son at [facility], pt and son verbalized understanding of tx, report called to receiving RN all questions answered, daughter also updated on tx   Pt daughter called regarding concerns from yesterday 10/1, daughter concerned on family not being kept up to date on events of tx from [facility], this RN thanked family on feedback and stated would f/u with the procedural department. Also apologized for lack of communication and asked if there were any current concerns. Daughter stated no, nursing will cont to monitor, update given to Agricultural consultant, and receiving RN

## 2014-07-14 NOTE — Evaluation (Signed)
Occupational Therapy Evaluation and Discharge Patient Details Name: [name] MRN: [id number] DOB: 1933/07/20 Today's Date: 07/14/2014    History of Present Illness Patient is a 80 y/o female admitted with CHF exacerbation and GIB with Hgb lowest at 6.0 ( up to 10 after transfusion).  PMH recent CVA left weakness, NSTEMI and small bowel AVM.   Clinical Impression   This 80 yo female admitted with above presents to acute OT at a min A level at worst and has family with her 24/7. No further acute OT needs, we will sign off. Pt would benefit from [facility] to work back towards a S level for [place].    Follow Up Recommendations  Home health OT    Equipment Recommendations  None recommended by OT       Precautions / Restrictions Precautions Precautions: Fall Restrictions Weight Bearing Restrictions: No      Mobility Bed Mobility Overal bed mobility: Modified Independent             General bed mobility comments: HOB slightly elevated  Transfers Overall transfer level: Needs assistance Equipment used: 1 person hand held assist Transfers: Sit to/from Stand Sit to Stand: Min assist         General transfer comment: for safety, s/p pain medication    Balance Overall balance assessment: Needs assistance Sitting-balance support: No upper extremity supported;Feet supported Sitting balance-Leahy Scale: Fair     Standing balance support: Bilateral upper extremity supported Standing balance-Leahy Scale: Poor Standing balance comment: "bouncing" of legs in standing and taking steps (both pt and son report this is not as bad when she has a RW in front of her)                            ADL Overall ADL's : Needs assistance/impaired Eating/Feeding: Independent;Sitting   Grooming: Set up;Sitting   Upper Body Bathing: Set up;Sitting   Lower Body Bathing: Minimal assistance;Sit to/from stand   Upper Body Dressing : Set up;Sitting   Lower Body Dressing:  Minimal assistance;Sit to/from stand   Toilet Transfer: Minimal assistance (sit>stand from bed; 3 steps forward and back, 3 steps sideways up towards [facility])   Toileting- Clothing Manipulation and Hygiene: Minimal assistance;Sit to/from stand                         Pertinent Vitals/Pain Pain Assessment:  (left flank, but did not rate)     Hand Dominance Right   Extremity/Trunk Assessment Upper Extremity Assessment Upper Extremity Assessment: Defer to OT evaluation   Lower Extremity Assessment Lower Extremity Assessment: RLE deficits/detail;LLE deficits/detail RLE Deficits / Details: AROM WFL seated, hip flexion 4/5, knee extension 4+/5 LLE Deficits / Details: AROM WFL seated hip flexion 4-/5, knee extension 4/5       Communication Communication Communication: No difficulties   Cognition Arousal/Alertness: Awake/alert Behavior During Therapy: WFL for tasks assessed/performed Overall Cognitive Status: History of cognitive impairments - at baseline                                Home Living Family/patient expects to be discharged to:: Private residence Living Arrangements: Children Available Help at Discharge: Family;Available 24 hours/day Type of Home: House Home Access: Stairs to enter [employer] of Steps: 1 Entrance Stairs-Rails: None Home Layout: One level     Bathroom Shower/Tub: Corporate investment banker:  Standard Bathroom Accessibility: No   Home Equipment: Walker - 2 wheels;Shower seat;Bedside commode   Additional Comments: son reports he and other family members A pt prn with BADLs (but usually she can do them by herself)      Prior Functioning/Environment Level of Independence: Needs assistance  Gait / Transfers Assistance Needed: supervision with RW ADL's / Homemaking Assistance Needed: grandaughter reports patient needs assist with bathing        OT Diagnosis: Generalized weakness;Acute pain   OT  Problem List: Decreased strength;Impaired balance (sitting and/or standing);Pain      OT Goals(Current goals can be found in the care plan section) Acute Rehab OT Goals Patient Stated Goal: I did not ask  OT Frequency:                End of Session    Activity Tolerance: Patient tolerated treatment well Patient left: in bed;with call bell/phone within reach;with bed alarm set;with family/visitor present   Time: 8315-1761 OT Time Calculation (min): 9 min Charges:  OT General Charges $OT Visit: 1 Procedure OT Evaluation $Initial OT Evaluation Tier I: 1 Procedure  Almon Register [phone] 07/14/2014, 4:42 PM

## 2014-07-14 NOTE — Progress Notes (Signed)
Medicare Important Message given?  YES (If response is "NO", the following Medicare IM given date fields will be blank) Date Medicare IM given:  07/14/2014 Medicare IM given by:  Felis Quillin 

## 2014-07-14 NOTE — Evaluation (Signed)
Physical Therapy Evaluation Patient Details Name: [name] MRN: [id number] DOB: 02/26/1933 Today's Date: 07/14/2014   History of Present Illness  Patient is a 80 y/o female admitted with CHF exacerbation and GIB with Hgb lowest at 6.0 ( up to 10 after transfusion).  PMH recent CVA left weakness, NSTEMI and small bowel AVM.  Clinical Impression  Patient presents with decreased independence with mobility due to deficits listed in PT problem list.  Patient will benefit from skilled PT in the acute setting to allow return home with assist and HHPT.    Follow Up Recommendations Supervision for mobility/OOB;Home health PT    Equipment Recommendations  None recommended by PT    Recommendations for Other Services       Precautions / Restrictions Precautions Precautions: Fall      Mobility  Bed Mobility               General bed mobility comments: NT due to in chair and awaiting transfer to new room  Transfers Overall transfer level: Needs assistance   Transfers: Sit to/from Stand Sit to Stand: Min guard         General transfer comment: for safety, s/p pain medication  Ambulation/Gait Ambulation/Gait assistance: Min guard Ambulation Distance (Feet): 8 Feet Assistive device: Rolling walker (2 wheeled) Gait Pattern/deviations: Step-to pattern;Trunk flexed;Shuffle     General Gait Details: short distance due to sleepy s/p pain medication  Stairs            Wheelchair Mobility    Modified Rankin (Stroke Patients Only)       Balance Overall balance assessment: History of Falls;Needs assistance           Standing balance-Leahy Scale: Poor Standing balance comment: UE assist for balance                             Pertinent Vitals/Pain Pain Assessment: No/denies pain (recent medication for flank pain)    Home Living Family/patient expects to be discharged to:: Private residence Living Arrangements: Children Available Help at  Discharge: Family;Available 24 hours/day;Personal care attendant Type of Home: House Home Access: Stairs to enter Entrance Stairs-Rails: None Entrance Stairs-Number of Steps: 1 Home Layout: One level Home Equipment: Walker - 2 wheels;Shower seat      Prior Function Level of Independence: Needs assistance   Gait / Transfers Assistance Needed: supervision with RW  ADL's / Homemaking Assistance Needed: grandaughter reports patient needs assist with bathing        Hand Dominance   Dominant Hand: Right    Extremity/Trunk Assessment               Lower Extremity Assessment: RLE deficits/detail;LLE deficits/detail RLE Deficits / Details: AROM WFL seated, hip flexion 4/5, knee extension 4+/5 LLE Deficits / Details: AROM WFL seated hip flexion 4-/5, knee extension 4/5     Communication   Communication: No difficulties  Cognition Arousal/Alertness: Lethargic;Suspect due to medications Behavior During Therapy: [facility] for tasks assessed/performed Overall Cognitive Status: History of cognitive impairments - at baseline                      General Comments      Exercises        Assessment/Plan    PT Assessment Patient needs continued PT services  PT Diagnosis Generalized weakness;Abnormality of gait   PT Problem List Decreased strength;Decreased activity tolerance;Decreased balance;Pain;Decreased mobility;Decreased safety awareness  PT Treatment Interventions  DME instruction;Gait training;Functional mobility training;Therapeutic activities;Patient/family education;Balance training;Therapeutic exercise   PT Goals (Current goals can be found in the Care Plan section) Acute Rehab PT Goals Patient Stated Goal: To go home PT Goal Formulation: With patient/family Time For Goal Achievement: 07/28/14 Potential to Achieve Goals: Good    Frequency Min 3X/week   Barriers to discharge        Co-evaluation               End of Session Equipment Utilized  During Treatment: Gait belt Activity Tolerance: Patient limited by fatigue Patient left: in chair;with call bell/phone within reach;with family/visitor present           Time: 4462-8638 PT Time Calculation (min): 23 min   Charges:   PT Evaluation $Initial PT Evaluation Tier I: 1 Procedure PT Treatments $Gait Training: 8-22 mins   PT G Codes:          Dalexa Gentz,CYNDI 08/08/2014, 1:02 PM Magda Kiel, [place] 08/08/14

## 2014-07-14 NOTE — Progress Notes (Signed)
Subjective: No further episodes.   Exam: Filed Vitals:   07/14/14 1200  BP:   Pulse:   Temp: 97.8 F (36.6 C)  Resp:    Gen: In bed, NAD MS: Awake, alert, interactive and appropriate TG:YBWLS  She has a couple of episodes that look like eyelid myokymia on the left.  EEG negative  Impression: 80 yo F with likley hypoglycemic episode yesterday. I feel that the twitching, if similar to what I saw today, is likely eyelid myokymia.   Recommendations: 1) No further recommendations, neurology will sign off.   Roland Rack, MD [facility] [phone]  If 7pm- 7am, please page neurology on call as listed in [place].

## 2014-07-15 LAB — CBC
HCT: 29.2 % — ABNORMAL LOW (ref 36.0–46.0)
HEMOGLOBIN: 9.5 g/dL — AB (ref 12.0–15.0)
MCH: 28.2 pg (ref 26.0–34.0)
MCHC: 32.5 g/dL (ref 30.0–36.0)
MCV: 86.6 fL (ref 78.0–100.0)
Platelets: 172 10*3/uL (ref 150–400)
RBC: 3.37 MIL/uL — ABNORMAL LOW (ref 3.87–5.11)
RDW: 15.8 % — ABNORMAL HIGH (ref 11.5–15.5)
WBC: 6.3 10*3/uL (ref 4.0–10.5)

## 2014-07-15 LAB — GLUCOSE, CAPILLARY
Glucose-Capillary: 273 mg/dL — ABNORMAL HIGH (ref 70–99)
Glucose-Capillary: 269 mg/dL — ABNORMAL HIGH (ref 70–99)

## 2014-07-15 MED ORDER — PANTOPRAZOLE SODIUM 40 MG PO TBEC: 40.0000 mg | DELAYED_RELEASE_TABLET | Freq: Two times a day (BID) | ORAL | Status: DC

## 2014-07-15 MED ORDER — PANTOPRAZOLE SODIUM 40 MG PO TBEC
40.0000 mg | DELAYED_RELEASE_TABLET | Freq: Two times a day (BID) | ORAL | Status: DC
  Administered 2014-07-15: 40 mg via ORAL
  Filled 2014-07-15: qty 1

## 2014-07-15 NOTE — Care Management Note (Signed)
    Page 1 of 1   07/15/2014     4:44:53 PM CARE MANAGEMENT NOTE 07/15/2014  Patient:  [name], [name]   Account Number:  [id number]  Date Initiated:  07/12/2014  Documentation initiated by:  Marvetta Gibbons  Subjective/Objective Assessment:   Pt admitted with GIB/anemia     Action/Plan:   PTA pt lived at home with son   Anticipated DC Date:  07/16/2014   Anticipated DC Plan:  [place]  CM consult      [place]   Choice offered to / List presented to:  C-1 Patient        [place] arranged  [place].   Status of service:  Completed, signed off Medicare Important Message given?  YES (If response is "NO", the following Medicare IM given date fields will be blank) Date Medicare IM given:  07/14/2014 Medicare IM given by:  [facility] Date Additional Medicare IM given:   Additional Medicare IM given by:    Discharge Disposition:  [place]  Per UR Regulation:  Reviewed for med. necessity/level of care/duration of stay  If discussed at [place] of Stay Meetings, dates discussed:    Comments:  07/15/14 16:40 CM met with pt in room and pt states it is ok to discuss [place] arrangements with extended family in room. Pt's son,  [name] [phone], is best contact asw pt is very HOH.  [facility] chosen to render HHPT/OT services.  Address and contact [name] relayed to [facility] rep, Lelan Pons when referral called.  No other CM needs were communicated.  Mariane Masters, BSN, [place].

## 2014-07-15 NOTE — Discharge Summary (Signed)
Physician Discharge Summary  [name] ZOX:[id number] DOB: 15-Apr-1933 DOA: 07/11/2014  PCP: [name], MD  Admit date: 07/11/2014 Discharge date: 07/15/2014  Time spent: 35 minutes  Recommendations for Outpatient Follow-up:   Discharge Diagnoses:    GIB (gastrointestinal bleeding)   Encephalopathy   Hypoglycemia.    CKD stage IV.     Discharge Condition: Stable.   Diet recommendation: [employer] Healthy  Filed Weights   07/14/14 0444 07/14/14 2021 07/15/14 0500  Weight: 64.7 kg (142 lb 10.2 oz) 63.957 kg (141 lb) 63.95 kg (140 lb 15.8 oz)    History of present illness:  History of Present Illness:This is a 80 y.o. year old female with significant past medical history of multiple medical issues including recent admission 8/11-9/1 for acute embolic CVA and GIB 2/2 small bowel AVM s/p clipping and NSTEMI presenting with weakness, GIB, AoC combined CHF exacerbation. Per the family, pt was noted to be progressively weak over the past day. Family also noticed pt with dark/marron colored stools concerning for BIB recurrence. Family/pt deny and NSAID use apart from baby ASA. No recent strenous activity. Pt states that she has some mild L sided weakness that is stable from recent CVA. No acute worsening. Pt family deny any orhopnea, PND, LE swelling. Deny any salty food intake.  On presentation to the ER, BP mildly elevated into 160s-170s. Otherwise hemodynamically stable. Hgb 7.5, Cr 1.78, BUN 46, glu 223. CXR shows cardiomegaly. Trop neg x1. Hemoccult positive. Pro BNP @ 6841 (was 14k during last admission). Weight today pending.    Hospital Course:  Admit HPI / Brief Narrative:  80 yo F with medical history of recent admission 78/78-2/9 for acute embolic CVA and GIB 2/2 small bowel AVM s/p clipping and NSTEMI who presented 929 with weakness, GIB, and an apparent acute on chronic CHF exacerbation. Per the family, pt was noted to be progressively weaker. Family also noticed pt with  dark/maroon colored stools. Family/pt denied NSAID use apart from baby ASA.  Marland Kitchen  On presentation to the ER, BP was mildly elevated into 160s-170s. Otherwise hemodynamically stable. Hgb 7.5, Cr 1.78, BUN 46, glu 223. CXR noted cardiomegaly.   HPI/Subjective:  Pt is alert in no distress. Alert. oriented to place and person. Denies abdominal pain.   Assessment/Plan:  Encephalopathy: related to hypoglycemia, anemia.  Patient became more lethargic, speaking less, decrease respond time on 10-01.  She had twitching movement left side face muscle. Per son she has had this twitching movement at home.  CT head negative, ABG no hypercapnia.  Neurology consulted. EEG ordered.  After blood transfusion initiated patient became more alert.  Resolved, back to baseline.   Abdominal pain:  KUB negative, resolved.   Recurrent GIB  recent admission with small bowel AVM s/p clipping by GI 04/2014 - Hgb nadir of 6.0 - HOLD baby ASA  Colonoscopy performed recently was unremarkable  Transfuse i unit of PRBC.  Hb stable at 9 S/P Enteroscopy: The mucosa of the esophagus appeared normal . There was mild non-bleeding gastropathy on the greater curvature of the gastric body .The duodenal mucosa showed no abnormalities in the entire  duodenum .Angiodysplastic lesion with no bleeding was found in the proximal jejunum; Argon plasma coagulation was applied to the site(s); with good treatment effect  Discharge on iron.   Acute / subacute blood loss anemia  Hgb nadir of 6.0 - s/p 1U PRBC - follow Hgb in serial fashion - goal is to keep Hgb 8.0 or >  Sp  another unit PRBC on 10-01. 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 Stable at 1.8.  Renal US; No hydronephrosis or renal obstruction is noted.   DM 2  CBG reasonably controlled - follow trend  Episode of hypoglycemia.  Hold lantus. SSI.   HTN  BP poorly controlled - adjust tx and follow trend  Continue with Imdur, metoprolol,   HLD  Cont home medical tx    Procedures:  Enteroscopy:   EEG; This normal EEG is recorded in the waking and drowsy state. There was no seizure or seizure predisposition recorded on this study  Enteroscopy: 1. The mucosa of the esophagus appeared normal . There was mild non-bleeding gastropathy on the greater curvature  of the gastric body . The stomach otherwise appeared normal .The duodenal mucosa showed no abnormalities in the entire  Duodenum. Angiodysplastic lesion with no bleeding was found in the proximal jejunum; Argon plasma coagulation was applied to the  site(s); with good treatment effect     Consultations:  Gastroenterologist.   Neurologist.   Discharge Exam: Filed Vitals:   07/15/14 1300  BP: 132/74  Pulse: 67  Temp:   Resp: 18    General: no distress.  Cardiovascular: S 1, S 2 RRR Respiratory: CTA  Discharge Instructions You were cared for by a hospitalist during your hospital stay. If you have any questions about your discharge medications or the care you received while you were in the hospital after you are discharged, you can call the unit and asked to speak with the hospitalist on call if the hospitalist that took care of you is not available. Once  you are discharged, your primary care physician will handle any further medical issues. Please note that NO REFILLS for any discharge medications will be authorized once you are discharged, as it is imperative that you return to your primary care physician (or establish a relationship with a primary care physician if you do not have one) for your aftercare needs so that they can reassess your need for medications and monitor your lab values.  Discharge Instructions   Diet - low sodium heart healthy    Complete by:  As directed      Increase activity slowly    Complete by:  As directed           Current Discharge Medication List    START taking these medications   Details  pantoprazole (PROTONIX) 40 MG tablet Take 1 tablet (40 mg total) by mouth 2 (two) times daily. Qty: 60 tablet, Refills: 0      CONTINUE these medications which have NOT CHANGED   Details  acetaminophen (TYLENOL) 500 MG tablet Take 500  mg by mouth every 6 (six) hours as needed for moderate pain or headache.    albuterol (PROAIR HFA) 108 (90 BASE) MCG/ACT inhaler Inhale 2 puffs into the lungs every 6 (six) hours as needed for wheezing. Qty: 8.5 g, Refills: 11    amLODipine (NORVASC) 5 MG tablet Take 1 tablet (5 mg total) by mouth daily. Qty: 30 tablet, Refills: 1    atorvastatin (LIPITOR) 40 MG tablet Take 1 tablet (40 mg total) by mouth daily at 6 PM. Qty: 30 tablet, Refills: 11    ferrous sulfate 325 (65 FE) MG tablet Take 1 tablet (325 mg total) by mouth 2 (two) times daily with a meal. Qty: 60 tablet, Refills: 5    insulin glargine (LANTUS) 100 UNIT/ML injection 5 units in the a.m. and 8 units each bedtime Qty: 10 mL, Refills: 11    isosorbide mononitrate (IMDUR) 60 MG 24 hr tablet Take 1 tablet (60 mg total) by mouth daily. Qty: 30 tablet, Refills: 2    loratadine (ALLERGY RELIEF) 10 MG tablet Take 10 mg by mouth daily.    magnesium oxide (MAG-OX) 400 (241.3 MG) MG tablet Take 1 tablet (400 mg total) by  mouth 2 (two) times daily. Qty: 60 tablet, Refills: 1    metoprolol succinate (TOPROL-XL) 12.5 mg TB24 24 hr tablet Take 0.5 tablets (12.5 mg total) by mouth daily. Qty: 30 tablet, Refills: 1    Multiple Vitamins-Minerals (ALIVE WOMENS 50+ PO) Take 1 tablet by mouth daily.    omeprazole (PRILOSEC) 20 MG capsule Take 1 capsule (20 mg total) by mouth daily before breakfast. Qty: 30 capsule, Refills: 11   Associated Diagnoses: Erosive gastritis with hemorrhage    polyethylene glycol (MIRALAX / GLYCOLAX) packet Take 17 g by mouth daily.      STOP taking these medications     aspirin 81 MG tablet      lisinopril (PRINIVIL,ZESTRIL) 5 MG tablet      Phenylephrine-DM-GG (TUSSIN CF COUGH & COLD PO)        Allergies  Allergen Reactions  . Baking Soda-Fluoride [Sodium Fluoride] Nausea And Vomiting  . Magnesium Hydroxide Nausea And Vomiting   Follow-up Information   Follow up with HILL,GERALD K, MD In 1 week.   Specialty:  Family Medicine   Contact information:   [address] [place] [address] [phone]        The results of significant diagnostics from this hospitalization (including imaging, microbiology, ancillary and laboratory) are listed below for reference.    Significant Diagnostic Studies: Dg Chest 2 View  07/11/2014   CLINICAL DATA:  Extreme shortness of breath for several days. History of heart disease, diabetes and hypertension.  EXAM: CHEST  2 VIEW  COMPARISON:  Radiographs 02/04/2014 and 05/10/2014. Abdominal CT 02/05/2014.  FINDINGS: There is cardiomegaly with vascular congestion. Compared with the prior studies, there are increased patchy opacities at both lung bases and increased fissural thickening. There is no consolidation or significant pleural effusion. The osseous structures appear stable.  IMPRESSION: Cardiomegaly with increased vascular congestion and patchy bibasilar opacities, likely congestive heart failure. No focal airspace disease.    Electronically Signed   By: Camie Patience M.D.   On: 07/11/2014 17:56   Ct Head Wo Contrast  07/13/2014   CLINICAL DATA:  Encephalopathy.  EXAM: CT HEAD WITHOUT CONTRAST  TECHNIQUE: Contiguous axial images were obtained from the base of the skull through the vertex without intravenous contrast.  COMPARISON:  CT scan of May 10, 2014.  FINDINGS: Bony calvarium appears intact. Right frontal sinusitis is noted. Mild diffuse cortical atrophy is noted. Mild chronic ischemic white matter disease is noted. No mass effect or midline shift is noted. Ventricular size is within normal limits. There is no evidence of mass lesion, hemorrhage or acute infarction.  IMPRESSION: Mild diffuse cortical atrophy. Mild chronic ischemic white matter disease. Right frontal sinusitis. No other acute intracranial abnormality seen.   Electronically Signed   By: Sabino Dick M.D.   On: 07/13/2014 17:02   US Renal  07/13/2014   CLINICAL DATA:  Acute on chronic renal failure.  EXAM: RENAL/URINARY TRACT ULTRASOUND COMPLETE  COMPARISON:  None.  FINDINGS: Right Kidney:  Length: 10.1 cm. Increased echogenicity is noted consistent with medical renal disease. No mass or hydronephrosis visualized.  Left Kidney:  Length: 10.1 cm. Increased echogenicity is noted consistent with medical renal disease. No mass or hydronephrosis visualized.  Bladder:  Appears normal for degree of bladder distention.  IMPRESSION: Increased echogenicity of renal parenchyma is noted bilaterally consistent with medical renal disease. No hydronephrosis or renal obstruction is noted.   Electronically Signed   By: Sabino Dick M.D.   On: 07/13/2014 17:30   Dg Abd Portable 1v  07/13/2014   CLINICAL DATA:  LEFT lower quadrant pain, anemia at presentation on 07/11/2014, recent stroke, progressive weakness, dark colored stools question GI bleed ; history CHF, stroke, hypertension, acute renal insufficiency, diabetes, atrial fibrillation, coronary artery disease post MI,  initial encounter  EXAM: PORTABLE ABDOMEN - 1 VIEW  COMPARISON:  Portable exam 1028 hr compared to CT abdomen and pelvis 02/05/2014  FINDINGS: Nonobstructive bowel gas pattern.  No bowel dilatation or bowel wall thickening.  Scattered atherosclerotic calcifications.  No acute osseous findings or definite urinary tract calcification.  Degenerative changes LEFT hip.  Bibasilar atelectasis.  IMPRESSION: Nonobstructive bowel gas pattern.   Electronically Signed   By: Lavonia Dana M.D.   On: 07/13/2014 10:53    Microbiology: Recent Results (from the past 240 hour(s))  MRSA PCR SCREENING     Status: None   Collection Time    07/12/14 12:19 AM      Result Value Ref Range Status   MRSA by PCR NEGATIVE  NEGATIVE Final   Comment:            The GeneXpert MRSA Assay (FDA     approved for NASAL specimens     only), is one component of a     comprehensive MRSA colonization     surveillance program. It is not     intended to diagnose MRSA     infection nor to guide or     monitor treatment for     MRSA infections.     Labs: Basic Metabolic Panel:  Recent Labs Lab 07/11/14 1855 07/12/14 0146 07/13/14 0630 07/14/14 1119  NA 137 140 143 134*  K 4.5 4.1 4.7 4.2  CL 102 106 109 99  CO2 21 23 25 22   GLUCOSE 223* 192* 54* 332*  BUN 46* 47* 40* 32*  CREATININE 1.78* 1.83* 1.98* 1.84*  CALCIUM 9.3 8.9 9.0 8.8   Liver Function Tests:  Recent Labs Lab 07/12/14 0146  AST 9  ALT 12  ALKPHOS 83  BILITOT <0.2*  PROT 6.4  ALBUMIN 2.8*   No results found for this basename: LIPASE, AMYLASE,  in the last 168 hours No results found for this basename: AMMONIA,  in the last 168 hours CBC:  Recent Labs Lab 07/11/14  1855 07/12/14 0146  07/12/14 1800 07/13/14 0630 07/13/14 1915 07/14/14 0310 07/15/14 1400  WBC 6.5 5.6  < > 6.4 6.6 11.0* 11.3* 6.3  NEUTROABS 4.7 3.2  --   --   --   --   --   --   HGB 7.5* 6.0*  < > 7.8* 7.6* 10.1* 9.8* 9.5*  HCT 23.6* 18.3*  < > 23.5* 23.4* 30.2* 29.6*  29.2*  MCV 85.5 83.2  < > 85.8 84.8 85.8 85.3 86.6  PLT 242 211  < > 188 PLATELET CLUMPS NOTED ON SMEAR, COUNT APPEARS ADEQUATE 230 231 172  < > = values in this interval not displayed. Cardiac Enzymes:  Recent Labs Lab 07/11/14 2113 07/12/14 0146 07/12/14 0845  TROPONINI <0.30 <0.30 <0.30   BNP: BNP (last 3 results)  Recent Labs  12/05/13 0448 05/10/14 0736 07/11/14 1855  PROBNP 6023.0* 14464.0* 6841.0*   CBG:  Recent Labs Lab 07/14/14 1223 07/14/14 1758 07/14/14 2119 07/15/14 0804 07/15/14 1154  GLUCAP 330* 343* 353* 273* 269*       Signed:  Regalado, Belkys A  [facility] 07/15/2014, 2:34 PM

## 2014-07-15 NOTE — Progress Notes (Signed)
Patient discharge teaching given, including activity, diet, follow-up appoints, and medications. Patient verbalized understanding of all discharge instructions. IV access was d/c'd. Vitals are stable. Skin is intact except as charted in most recent assessments. Pt to be escorted out by NT, to be driven home by family.  Alahna Dunne, MBA, BS, RN 

## 2014-07-17 LAB — GLUCOSE, CAPILLARY: GLUCOSE-CAPILLARY: 373 mg/dL — AB (ref 70–99)

## 2014-07-18 ENCOUNTER — Other Ambulatory Visit: Payer: Self-pay | Admitting: Cardiovascular Disease

## 2014-07-18 NOTE — Telephone Encounter (Signed)
Rx was sent to pharmacy electronically. 

## 2014-07-18 NOTE — Anesthesia Postprocedure Evaluation (Signed)
  Anesthesia Post-op Note  Patient: [name]  Procedure(s) Performed: Procedure(s) with comments: small bowel enteroscopy (N/A) - requests slim colonoscope  Patient Location: Endoscopy Unit  Anesthesia Type:MAC  Level of Consciousness: awake and alert   Airway and Oxygen Therapy: Patient Spontanous Breathing  Post-op Pain: none  Post-op Assessment: Post-op Vital signs reviewed  Post-op Vital Signs: stable  Last Vitals:  Filed Vitals:   07/15/14 1300  BP: 132/74  Pulse: 67  Temp:   Resp: 18    Complications: No apparent anesthesia complications

## 2014-07-25 ENCOUNTER — Telehealth: Payer: Self-pay | Admitting: Internal Medicine

## 2014-07-26 NOTE — Telephone Encounter (Signed)
Patient is scheduled for 08/01/14 with Nicoletta Ba PA

## 2014-07-26 NOTE — Telephone Encounter (Signed)
Left message for patient to call back  

## 2014-08-01 ENCOUNTER — Encounter: Payer: Self-pay | Admitting: *Deleted

## 2014-08-01 ENCOUNTER — Telehealth: Payer: Self-pay | Admitting: *Deleted

## 2014-08-01 ENCOUNTER — Ambulatory Visit: Payer: Self-pay | Admitting: Physician Assistant

## 2014-08-01 NOTE — Progress Notes (Unsigned)
Patient ID: [name], female   DOB: July 29, 1933, 80 y.o.   MRN: [id number] Per Nicoletta Ba PA- The patient did not show for her appointment. Call patient and advise her we can reschedule her appointment.  She does need to follow up from recent hospital stay, GI bleed.

## 2014-08-01 NOTE — Telephone Encounter (Signed)
Error on previous comment dated 08-01-2014.

## 2014-08-17 ENCOUNTER — Other Ambulatory Visit (INDEPENDENT_AMBULATORY_CARE_PROVIDER_SITE_OTHER): Payer: Medicare Other

## 2014-08-17 ENCOUNTER — Ambulatory Visit (INDEPENDENT_AMBULATORY_CARE_PROVIDER_SITE_OTHER): Payer: Medicare Other | Admitting: Physician Assistant

## 2014-08-17 ENCOUNTER — Encounter: Payer: Self-pay | Admitting: Physician Assistant

## 2014-08-17 VITALS — BP 140/78 | HR 72

## 2014-08-17 DIAGNOSIS — K5520 Angiodysplasia of colon without hemorrhage: Secondary | ICD-10-CM

## 2014-08-17 DIAGNOSIS — D500 Iron deficiency anemia secondary to blood loss (chronic): Secondary | ICD-10-CM

## 2014-08-17 DIAGNOSIS — Q2733 Arteriovenous malformation of digestive system vessel: Secondary | ICD-10-CM

## 2014-08-17 DIAGNOSIS — I6340 Cerebral infarction due to embolism of unspecified cerebral artery: Secondary | ICD-10-CM

## 2014-08-17 LAB — CBC WITH DIFFERENTIAL/PLATELET
BASOS ABS: 0 10*3/uL (ref 0.0–0.1)
Basophils Relative: 0.3 % (ref 0.0–3.0)
EOS ABS: 0 10*3/uL (ref 0.0–0.7)
Eosinophils Relative: 0.3 % (ref 0.0–5.0)
HCT: 33.1 % — ABNORMAL LOW (ref 36.0–46.0)
Hemoglobin: 10.7 g/dL — ABNORMAL LOW (ref 12.0–15.0)
LYMPHS ABS: 0.8 10*3/uL (ref 0.7–4.0)
LYMPHS PCT: 13.2 % (ref 12.0–46.0)
MCHC: 32.4 g/dL (ref 30.0–36.0)
MCV: 88.4 fl (ref 78.0–100.0)
MONOS PCT: 5.2 % (ref 3.0–12.0)
Monocytes Absolute: 0.3 10*3/uL (ref 0.1–1.0)
Neutro Abs: 4.9 10*3/uL (ref 1.4–7.7)
Neutrophils Relative %: 81 % — ABNORMAL HIGH (ref 43.0–77.0)
Platelets: 223 10*3/uL (ref 150.0–400.0)
RBC: 3.74 Mil/uL — ABNORMAL LOW (ref 3.87–5.11)
RDW: 16.5 % — AB (ref 11.5–15.5)
WBC: 6 10*3/uL (ref 4.0–10.5)

## 2014-08-17 LAB — IRON AND TIBC
%SAT: 44 % (ref 20–55)
Iron: 125 ug/dL (ref 42–145)
TIBC: 285 ug/dL (ref 250–470)
UIBC: 160 ug/dL (ref 125–400)

## 2014-08-17 LAB — FERRITIN: Ferritin: 28.1 ng/mL (ref 10.0–291.0)

## 2014-08-17 NOTE — Patient Instructions (Signed)
Please go to the basement level to have your labs drawn.  Come to our basement level lab around 09-18-2014.   Stay on Iron wupplement  twice daily. Follow up with Dr. Carlean Purl as needed.

## 2014-08-17 NOTE — Progress Notes (Signed)
Subjective:    Patient ID: [name], female    DOB: [birth year], 80 y.o.   MRN: [id number]  HPI  [name] is a pleasant 80 year old African American female known to Dr. Carlean Purl who has multiple medical problems including diabetes, chronic diastolic congestive heart failure with EF of 25-30%, ordinary artery disease status post MI April 2015, history of CVA 2015, asthma, sleep apnea, peripheral neuropathy, and atrial fibrillation. She has had recurrent GI bleeds secondary to intestinal AVMs. She is currently not on any anticoagulation but does take 1 baby aspirin daily. She comes in today for post hospital follow-up. She had undergone EGD in April 2015 with finding of gastritis colonoscopy in July 2015 normal she had enteroscopy done in August 2015 when she presented with melena weakness and a hemoglobin of 6. She had an AVM in her jejunum at that time which was a PC. She was then rehospitalized in October again with progressive anemia and dark stools. She had repeat small bowel enteroscopy done per Dr. Hilarie Fredrickson with finding of mild gastropathy and had 2 AVMs in the proximal jejunum which were APC. Last hemoglobin on 07/15/2014 was 9.5. Patient feels fine today has not had any recurrence of weakness no complaints of abdominal discomfort or nausea. She says her stools stay dark because she is on iron.    Review of Systems  Constitutional: Negative.   HENT: Negative.   Eyes: Negative.   Respiratory: Negative.   Cardiovascular: Negative.   Gastrointestinal: Negative.   Endocrine: Negative.   Genitourinary: Negative.   Musculoskeletal: Positive for gait problem.  Skin: Negative.   Allergic/Immunologic: Negative.   Neurological: Negative.   Hematological: Negative.   Psychiatric/Behavioral: Negative.    Outpatient Prescriptions Prior to Visit  Medication Sig Dispense Refill  . acetaminophen (TYLENOL) 500 MG tablet Take 500 mg by mouth every 6 (six) hours as needed for moderate pain or  headache.    . albuterol (PROAIR HFA) 108 (90 BASE) MCG/ACT inhaler Inhale 2 puffs into the lungs every 6 (six) hours as needed for wheezing. 8.5 g 11  . amLODipine (NORVASC) 5 MG tablet Take 1 tablet (5 mg total) by mouth daily. 30 tablet 1  . atorvastatin (LIPITOR) 40 MG tablet Take 1 tablet (40 mg total) by mouth daily at 6 PM. 30 tablet 11  . ferrous sulfate 325 (65 FE) MG tablet Take 1 tablet (325 mg total) by mouth 2 (two) times daily with a meal. 60 tablet 5  . insulin glargine (LANTUS) 100 UNIT/ML injection 5 units in the a.m. and 8 units each bedtime 10 mL 11  . isosorbide mononitrate (IMDUR) 60 MG 24 hr tablet Take 1 tablet (60 mg total) by mouth daily. 30 tablet 2  . loratadine (ALLERGY RELIEF) 10 MG tablet Take 10 mg by mouth daily.    . magnesium oxide (MAG-OX) 400 (241.3 MG) MG tablet Take 1 tablet (400 mg total) by mouth 2 (two) times daily. 60 tablet 1  . metoprolol succinate (TOPROL-XL) 25 MG 24 hr tablet Take 1 tablet (25 mg total) by mouth daily. 30 tablet 4  . Multiple Vitamins-Minerals (ALIVE WOMENS 50+ PO) Take 1 tablet by mouth daily.    Marland Kitchen omeprazole (PRILOSEC) 20 MG capsule Take 1 capsule (20 mg total) by mouth daily before breakfast. 30 capsule 11  . polyethylene glycol (MIRALAX / GLYCOLAX) packet Take 17 g by mouth daily.    . pantoprazole (PROTONIX) 40 MG tablet Take 1 tablet (40 mg total) by mouth 2 (two)  times daily. 60 tablet 0   No facility-administered medications prior to visit.   Allergies  Allergen Reactions  . Baking Soda-Fluoride [Sodium Fluoride] Nausea And Vomiting  . Magnesium Hydroxide Nausea And Vomiting   Patient Active Problem List   Diagnosis Date Noted  . Altered mental status 07/13/2014  . GIB (gastrointestinal bleeding) 07/11/2014  . Angiodysplasia of intestine with hemorrhage 05/13/2014  . Acute ischemic stroke 05/11/2014  . Anemia 05/10/2014  . GI bleed 05/10/2014  . Melena 05/10/2014  . Ataxia, late effect of cerebrovascular disease  03/23/2014  . Atrial fibrillation 03/02/2014  . CVA (cerebral infarction) 02/10/2014  . Erosive gastritis with hemorrhage 02/08/2014  . Acute bilat watershed infarction vs. embolic strokes 34/74/2595  . Chronic diastolic CHF EF 63% 87/56/4332  . NSTEMI (non-ST elevated myocardial infarction) 02/04/2014  . Nonischemic cardiomyopathy 12/12/2013  . ARF (acute renal failure) 12/05/2013  . Memory difficulties 12/04/2013  . Pulmonary edema 12/03/2013  . Acute diastolic CHF (congestive heart failure) 12/03/2013  . Irregular heart rhythm 02/26/2012  . Ventricular bigeminy 02/26/2012  . CAD (coronary artery disease)   . CEREBRAL EMBOLISM, WITH INFARCTION 07/02/2010  . OSTEOPOROSIS 03/21/2009  . DEGENERATIVE JOINT DISEASE 07/24/2008  . INSOMNIA-SLEEP DISORDER-UNSPEC 07/24/2008  . Iron deficiency anemia secondary to blood loss (chronic) 01/06/2008  . OBSTRUCTIVE SLEEP APNEA 01/06/2008  . GASTROPARESIS, DIABETIC 08/03/2006  . HYPERLIPIDEMIA 08/03/2006  . PERIPHERAL NEUROPATHY 08/03/2006  . HYPERTENSION 08/03/2006  . Chronic diastolic congestive heart failure 08/03/2006  . REACTIVE AIRWAY DISEASE 08/03/2006  . GERD 08/03/2006  . DM (diabetes mellitus), type 2, uncontrolled 11/04/1983   History  Substance Use Topics  . Smoking status: Never Smoker   . Smokeless tobacco: Former Systems developer    Types: Snuff    Quit date: 10/13/2006     Comment: 02/26/12 "stopped snuff 4-6 years ago"  . Alcohol Use: No     Comment: "stopped drinking alcohol ~ 1980's"   family history includes Diabetes insipidus in her mother; Hypertension in her child, father, mother, and sister; Stomach cancer in her brother.     Objective:   Physical Exam  Well-developed elderly African-American female in no acute distress, accompanied by family member blood pressure 140/78 pulse 72 not weighed today she is in a wheelchair. HEENT; nontraumatic normocephalic EOMI PERRLA, Cardiovascular ;regular rate and rhythm with systolic  murmur, Pulmonary clear bilaterally, Abdomen; soft ,nontender, nondistended, bowel sounds are active, Rectal ;exam not done, Extremities; no clubbing cyanosis or edema skin warm and dry, Psych; mood and affect appropriate        Assessment & Plan:  #7  80 year old female with multiple comorbidities with history of recurrent GI bleeding secondary to intestinal AVMs. She has had 2 small bowel enteroscopies this year- the last done about a month ago with finding of 2 proximal jejunal AVMs which were APC'd No recurrent bleeding since #2 chronic anemia secondary to above #3 chronic diastolic congestive heart failure #4 coronary artery disease status post MI April 2015 #5 status post CVA April 2015 #6 sleep apnea #7 hypertension Rate diabetes mellitus  Plan; patient will remain on aspirin 81 mg per day Check CBC and iron studies today Plan follow-up CBC in 1 month Continue ferrous sulfate 325 twice daily She will follow-up with Dr. Carlean Purl as needed, and will benefit from serial CBCs, and transfusions as needed

## 2014-08-18 ENCOUNTER — Other Ambulatory Visit: Payer: Self-pay

## 2014-08-18 DIAGNOSIS — D539 Nutritional anemia, unspecified: Secondary | ICD-10-CM

## 2014-08-19 ENCOUNTER — Emergency Department (HOSPITAL_COMMUNITY): Payer: Medicare Other

## 2014-08-19 ENCOUNTER — Encounter (HOSPITAL_COMMUNITY): Payer: Self-pay | Admitting: *Deleted

## 2014-08-19 ENCOUNTER — Inpatient Hospital Stay (HOSPITAL_COMMUNITY)
Admission: EM | Admit: 2014-08-19 | Discharge: 2014-08-22 | DRG: 189 | Disposition: A | Discharge status: Home / Self Care | Payer: Medicare Other | Attending: Internal Medicine | Admitting: Internal Medicine

## 2014-08-19 DIAGNOSIS — J9601 Acute respiratory failure with hypoxia: Secondary | ICD-10-CM

## 2014-08-19 DIAGNOSIS — Z794 Long term (current) use of insulin: Secondary | ICD-10-CM | POA: Diagnosis not present

## 2014-08-19 DIAGNOSIS — I340 Nonrheumatic mitral (valve) insufficiency: Secondary | ICD-10-CM | POA: Diagnosis present

## 2014-08-19 DIAGNOSIS — M810 Age-related osteoporosis without current pathological fracture: Secondary | ICD-10-CM | POA: Diagnosis present

## 2014-08-19 DIAGNOSIS — J9600 Acute respiratory failure, unspecified whether with hypoxia or hypercapnia: Principal | ICD-10-CM | POA: Diagnosis present

## 2014-08-19 DIAGNOSIS — Z8249 Family history of ischemic heart disease and other diseases of the circulatory system: Secondary | ICD-10-CM | POA: Diagnosis not present

## 2014-08-19 DIAGNOSIS — G8929 Other chronic pain: Secondary | ICD-10-CM | POA: Diagnosis present

## 2014-08-19 DIAGNOSIS — K3184 Gastroparesis: Secondary | ICD-10-CM | POA: Diagnosis present

## 2014-08-19 DIAGNOSIS — Z950 Presence of cardiac pacemaker: Secondary | ICD-10-CM

## 2014-08-19 DIAGNOSIS — I5023 Acute on chronic systolic (congestive) heart failure: Secondary | ICD-10-CM

## 2014-08-19 DIAGNOSIS — Y95 Nosocomial condition: Secondary | ICD-10-CM | POA: Diagnosis present

## 2014-08-19 DIAGNOSIS — Z90710 Acquired absence of both cervix and uterus: Secondary | ICD-10-CM

## 2014-08-19 DIAGNOSIS — I509 Heart failure, unspecified: Secondary | ICD-10-CM

## 2014-08-19 DIAGNOSIS — I272 Other secondary pulmonary hypertension: Secondary | ICD-10-CM | POA: Diagnosis present

## 2014-08-19 DIAGNOSIS — I428 Other cardiomyopathies: Secondary | ICD-10-CM | POA: Diagnosis present

## 2014-08-19 DIAGNOSIS — D509 Iron deficiency anemia, unspecified: Secondary | ICD-10-CM | POA: Diagnosis present

## 2014-08-19 DIAGNOSIS — D500 Iron deficiency anemia secondary to blood loss (chronic): Secondary | ICD-10-CM | POA: Diagnosis present

## 2014-08-19 DIAGNOSIS — J45909 Unspecified asthma, uncomplicated: Secondary | ICD-10-CM | POA: Diagnosis present

## 2014-08-19 DIAGNOSIS — I129 Hypertensive chronic kidney disease with stage 1 through stage 4 chronic kidney disease, or unspecified chronic kidney disease: Secondary | ICD-10-CM | POA: Diagnosis present

## 2014-08-19 DIAGNOSIS — E785 Hyperlipidemia, unspecified: Secondary | ICD-10-CM | POA: Diagnosis present

## 2014-08-19 DIAGNOSIS — I472 Ventricular tachycardia: Secondary | ICD-10-CM | POA: Diagnosis present

## 2014-08-19 DIAGNOSIS — J209 Acute bronchitis, unspecified: Secondary | ICD-10-CM | POA: Diagnosis present

## 2014-08-19 DIAGNOSIS — I480 Paroxysmal atrial fibrillation: Secondary | ICD-10-CM

## 2014-08-19 DIAGNOSIS — I639 Cerebral infarction, unspecified: Secondary | ICD-10-CM | POA: Diagnosis present

## 2014-08-19 DIAGNOSIS — J189 Pneumonia, unspecified organism: Secondary | ICD-10-CM | POA: Diagnosis present

## 2014-08-19 DIAGNOSIS — I2510 Atherosclerotic heart disease of native coronary artery without angina pectoris: Secondary | ICD-10-CM | POA: Diagnosis present

## 2014-08-19 DIAGNOSIS — K5521 Angiodysplasia of colon with hemorrhage: Secondary | ICD-10-CM | POA: Diagnosis present

## 2014-08-19 DIAGNOSIS — I5043 Acute on chronic combined systolic (congestive) and diastolic (congestive) heart failure: Secondary | ICD-10-CM | POA: Diagnosis present

## 2014-08-19 DIAGNOSIS — I4891 Unspecified atrial fibrillation: Secondary | ICD-10-CM | POA: Diagnosis present

## 2014-08-19 DIAGNOSIS — E1143 Type 2 diabetes mellitus with diabetic autonomic (poly)neuropathy: Secondary | ICD-10-CM | POA: Diagnosis present

## 2014-08-19 DIAGNOSIS — G4733 Obstructive sleep apnea (adult) (pediatric): Secondary | ICD-10-CM | POA: Diagnosis present

## 2014-08-19 DIAGNOSIS — K219 Gastro-esophageal reflux disease without esophagitis: Secondary | ICD-10-CM | POA: Diagnosis present

## 2014-08-19 DIAGNOSIS — N184 Chronic kidney disease, stage 4 (severe): Secondary | ICD-10-CM | POA: Diagnosis present

## 2014-08-19 DIAGNOSIS — IMO0002 Reserved for concepts with insufficient information to code with codable children: Secondary | ICD-10-CM | POA: Diagnosis present

## 2014-08-19 DIAGNOSIS — I10 Essential (primary) hypertension: Secondary | ICD-10-CM | POA: Diagnosis present

## 2014-08-19 DIAGNOSIS — E1165 Type 2 diabetes mellitus with hyperglycemia: Secondary | ICD-10-CM | POA: Diagnosis present

## 2014-08-19 HISTORY — DX: Chronic kidney disease, stage 4 (severe): N18.4

## 2014-08-19 LAB — URINALYSIS, ROUTINE W REFLEX MICROSCOPIC
Bilirubin Urine: NEGATIVE
Glucose, UA: NEGATIVE mg/dL
Ketones, ur: NEGATIVE mg/dL
Nitrite: NEGATIVE
Protein, ur: 100 mg/dL — AB
SPECIFIC GRAVITY, URINE: 1.021 (ref 1.005–1.030)
UROBILINOGEN UA: 0.2 mg/dL (ref 0.0–1.0)
pH: 5 (ref 5.0–8.0)

## 2014-08-19 LAB — DIFFERENTIAL
BASOS ABS: 0 10*3/uL (ref 0.0–0.1)
BASOS PCT: 0 % (ref 0–1)
EOS ABS: 0 10*3/uL (ref 0.0–0.7)
EOS PCT: 1 % (ref 0–5)
LYMPHS ABS: 1 10*3/uL (ref 0.7–4.0)
LYMPHS PCT: 15 % (ref 12–46)
Monocytes Absolute: 0.4 10*3/uL (ref 0.1–1.0)
Monocytes Relative: 6 % (ref 3–12)
NEUTROS ABS: 5.4 10*3/uL (ref 1.7–7.7)
NEUTROS PCT: 78 % — AB (ref 43–77)

## 2014-08-19 LAB — BASIC METABOLIC PANEL
Anion gap: 14 (ref 5–15)
BUN: 27 mg/dL — ABNORMAL HIGH (ref 6–23)
CO2: 20 meq/L (ref 19–32)
CREATININE: 1.56 mg/dL — AB (ref 0.50–1.10)
Calcium: 8.8 mg/dL (ref 8.4–10.5)
Chloride: 106 mEq/L (ref 96–112)
GFR calc Af Amer: 35 mL/min — ABNORMAL LOW (ref >90)
GFR calc non Af Amer: 30 mL/min — ABNORMAL LOW (ref >90)
GLUCOSE: 172 mg/dL — AB (ref 70–99)
Potassium: 4.5 mEq/L (ref 3.7–5.3)
Sodium: 140 mEq/L (ref 137–147)

## 2014-08-19 LAB — URINE MICROSCOPIC-ADD ON

## 2014-08-19 LAB — CBC
HCT: 33.6 % — ABNORMAL LOW (ref 36.0–46.0)
HEMOGLOBIN: 10.8 g/dL — AB (ref 12.0–15.0)
MCH: 28.4 pg (ref 26.0–34.0)
MCHC: 32.1 g/dL (ref 30.0–36.0)
MCV: 88.4 fL (ref 78.0–100.0)
Platelets: 246 10*3/uL (ref 150–400)
RBC: 3.8 MIL/uL — AB (ref 3.87–5.11)
RDW: 15.8 % — ABNORMAL HIGH (ref 11.5–15.5)
WBC: 6.8 10*3/uL (ref 4.0–10.5)

## 2014-08-19 LAB — I-STAT TROPONIN, ED: Troponin i, poc: 0.04 ng/mL (ref 0.00–0.08)

## 2014-08-19 LAB — I-STAT CG4 LACTIC ACID, ED: Lactic Acid, Venous: 1.19 mmol/L (ref 0.5–2.2)

## 2014-08-19 LAB — GLUCOSE, CAPILLARY
GLUCOSE-CAPILLARY: 145 mg/dL — AB (ref 70–99)
Glucose-Capillary: 206 mg/dL — ABNORMAL HIGH (ref 70–99)

## 2014-08-19 LAB — CBG MONITORING, ED: Glucose-Capillary: 166 mg/dL — ABNORMAL HIGH (ref 70–99)

## 2014-08-19 LAB — STREP PNEUMONIAE URINARY ANTIGEN: Strep Pneumo Urinary Antigen: NEGATIVE

## 2014-08-19 LAB — PRO B NATRIURETIC PEPTIDE: PRO B NATRI PEPTIDE: 17664 pg/mL — AB (ref 0–450)

## 2014-08-19 LAB — MAGNESIUM: MAGNESIUM: 2 mg/dL (ref 1.5–2.5)

## 2014-08-19 MED ORDER — LEVOFLOXACIN IN D5W 750 MG/150ML IV SOLN
750.0000 mg | Freq: Once | INTRAVENOUS | Status: DC
  Filled 2014-08-19: qty 150

## 2014-08-19 MED ORDER — HEPARIN SODIUM (PORCINE) 5000 UNIT/ML IJ SOLN: 5000.0000 [IU] | Freq: Three times a day (TID) | INTRAMUSCULAR | Status: DC

## 2014-08-19 MED ORDER — DEXTROSE 5 % IV SOLN: 1.0000 g | INTRAVENOUS | Status: DC

## 2014-08-19 MED ORDER — FERROUS SULFATE 325 (65 FE) MG PO TABS
325.0000 mg | ORAL_TABLET | Freq: Two times a day (BID) | ORAL | Status: DC
  Administered 2014-08-19 – 2014-08-22 (×7): 325 mg via ORAL
  Filled 2014-08-19 (×8): qty 1

## 2014-08-19 MED ORDER — FUROSEMIDE 10 MG/ML IJ SOLN
40.0000 mg | Freq: Two times a day (BID) | INTRAMUSCULAR | Status: DC
  Administered 2014-08-19 – 2014-08-20 (×2): 40 mg via INTRAVENOUS
  Filled 2014-08-19 (×4): qty 4

## 2014-08-19 MED ORDER — ALBUTEROL SULFATE (2.5 MG/3ML) 0.083% IN NEBU
3.0000 mL | INHALATION_SOLUTION | Freq: Four times a day (QID) | RESPIRATORY_TRACT | Status: DC | PRN
  Administered 2014-08-22: 3 mL via RESPIRATORY_TRACT
  Filled 2014-08-19: qty 3

## 2014-08-19 MED ORDER — IPRATROPIUM-ALBUTEROL 0.5-2.5 (3) MG/3ML IN SOLN
3.0000 mL | Freq: Once | RESPIRATORY_TRACT | Status: AC
  Administered 2014-08-19: 3 mL via RESPIRATORY_TRACT
  Filled 2014-08-19: qty 3

## 2014-08-19 MED ORDER — ISOSORBIDE MONONITRATE ER 60 MG PO TB24
60.0000 mg | ORAL_TABLET | Freq: Every day | ORAL | Status: DC
  Administered 2014-08-19 – 2014-08-22 (×4): 60 mg via ORAL
  Filled 2014-08-19 (×4): qty 1

## 2014-08-19 MED ORDER — SODIUM CHLORIDE 0.9 % IV SOLN: 250.0000 mL | INTRAVENOUS | Status: DC | PRN

## 2014-08-19 MED ORDER — INSULIN GLARGINE 100 UNIT/ML ~~LOC~~ SOLN
14.0000 [IU] | Freq: Every day | SUBCUTANEOUS | Status: DC
  Filled 2014-08-19: qty 0.14

## 2014-08-19 MED ORDER — PANTOPRAZOLE SODIUM 40 MG PO TBEC
40.0000 mg | DELAYED_RELEASE_TABLET | Freq: Every day | ORAL | Status: DC
  Administered 2014-08-19 – 2014-08-22 (×4): 40 mg via ORAL
  Filled 2014-08-19 (×3): qty 1

## 2014-08-19 MED ORDER — SODIUM CHLORIDE 0.9 % IJ SOLN: 3.0000 mL | INTRAMUSCULAR | Status: DC | PRN

## 2014-08-19 MED ORDER — INSULIN ASPART 100 UNIT/ML ~~LOC~~ SOLN: 0.0000 [IU] | Freq: Every day | SUBCUTANEOUS | Status: DC

## 2014-08-19 MED ORDER — INSULIN ASPART 100 UNIT/ML ~~LOC~~ SOLN
0.0000 [IU] | Freq: Three times a day (TID) | SUBCUTANEOUS | Status: DC
  Administered 2014-08-19: 5 [IU] via SUBCUTANEOUS
  Administered 2014-08-19: 2 [IU] via SUBCUTANEOUS
  Administered 2014-08-20: 5 [IU] via SUBCUTANEOUS
  Administered 2014-08-20: 3 [IU] via SUBCUTANEOUS
  Administered 2014-08-21: 2 [IU] via SUBCUTANEOUS
  Administered 2014-08-21 – 2014-08-22 (×3): 3 [IU] via SUBCUTANEOUS
  Administered 2014-08-22: 2 [IU] via SUBCUTANEOUS

## 2014-08-19 MED ORDER — ACETAMINOPHEN 325 MG PO TABS: 650.0000 mg | ORAL_TABLET | ORAL | Status: DC | PRN

## 2014-08-19 MED ORDER — INSULIN GLARGINE 100 UNIT/ML ~~LOC~~ SOLN
5.0000 [IU] | Freq: Every day | SUBCUTANEOUS | Status: DC
  Administered 2014-08-20 – 2014-08-21 (×2): 5 [IU] via SUBCUTANEOUS
  Filled 2014-08-19 (×4): qty 0.05

## 2014-08-19 MED ORDER — CEFEPIME HCL 1 G IJ SOLR
1.0000 g | Freq: Once | INTRAMUSCULAR | Status: DC
  Filled 2014-08-19: qty 1

## 2014-08-19 MED ORDER — POLYETHYLENE GLYCOL 3350 17 G PO PACK
17.0000 g | PACK | Freq: Every day | ORAL | Status: DC
  Administered 2014-08-19 – 2014-08-22 (×4): 17 g via ORAL
  Filled 2014-08-19 (×4): qty 1

## 2014-08-19 MED ORDER — LEVOFLOXACIN IN D5W 500 MG/100ML IV SOLN: 500.0000 mg | INTRAVENOUS | Status: DC

## 2014-08-19 MED ORDER — ONDANSETRON HCL 4 MG/2ML IJ SOLN: 4.0000 mg | Freq: Four times a day (QID) | INTRAMUSCULAR | Status: DC | PRN

## 2014-08-19 MED ORDER — VANCOMYCIN HCL IN DEXTROSE 1-5 GM/200ML-% IV SOLN
1000.0000 mg | Freq: Once | INTRAVENOUS | Status: AC
  Administered 2014-08-19: 1000 mg via INTRAVENOUS
  Filled 2014-08-19: qty 200

## 2014-08-19 MED ORDER — VANCOMYCIN HCL IN DEXTROSE 750-5 MG/150ML-% IV SOLN
750.0000 mg | INTRAVENOUS | Status: DC
  Administered 2014-08-20 – 2014-08-22 (×3): 750 mg via INTRAVENOUS
  Filled 2014-08-19 (×3): qty 150

## 2014-08-19 MED ORDER — MAGNESIUM OXIDE 400 (241.3 MG) MG PO TABS
400.0000 mg | ORAL_TABLET | Freq: Two times a day (BID) | ORAL | Status: DC
  Administered 2014-08-19 – 2014-08-22 (×7): 400 mg via ORAL
  Filled 2014-08-19 (×8): qty 1

## 2014-08-19 MED ORDER — AMLODIPINE BESYLATE 5 MG PO TABS
5.0000 mg | ORAL_TABLET | Freq: Every day | ORAL | Status: DC
  Administered 2014-08-19 – 2014-08-22 (×4): 5 mg via ORAL
  Filled 2014-08-19 (×4): qty 1

## 2014-08-19 MED ORDER — FUROSEMIDE 10 MG/ML IJ SOLN
40.0000 mg | Freq: Once | INTRAMUSCULAR | Status: AC
  Administered 2014-08-19: 40 mg via INTRAVENOUS
  Filled 2014-08-19: qty 4

## 2014-08-19 MED ORDER — CETYLPYRIDINIUM CHLORIDE 0.05 % MT LIQD
7.0000 mL | Freq: Two times a day (BID) | OROMUCOSAL | Status: DC
  Administered 2014-08-19 – 2014-08-22 (×6): 7 mL via OROMUCOSAL

## 2014-08-19 MED ORDER — ATORVASTATIN CALCIUM 40 MG PO TABS
40.0000 mg | ORAL_TABLET | Freq: Every day | ORAL | Status: DC
  Administered 2014-08-19 – 2014-08-22 (×4): 40 mg via ORAL
  Filled 2014-08-19 (×4): qty 1

## 2014-08-19 MED ORDER — CIPROFLOXACIN IN D5W 400 MG/200ML IV SOLN
400.0000 mg | INTRAVENOUS | Status: DC
  Administered 2014-08-19 – 2014-08-20 (×2): 400 mg via INTRAVENOUS
  Filled 2014-08-19 (×3): qty 200

## 2014-08-19 MED ORDER — METOPROLOL SUCCINATE ER 25 MG PO TB24
25.0000 mg | ORAL_TABLET | Freq: Every day | ORAL | Status: DC
  Administered 2014-08-19 – 2014-08-22 (×4): 25 mg via ORAL
  Filled 2014-08-19 (×4): qty 1

## 2014-08-19 MED ORDER — SODIUM CHLORIDE 0.9 % IJ SOLN
3.0000 mL | Freq: Two times a day (BID) | INTRAMUSCULAR | Status: DC
  Administered 2014-08-19 – 2014-08-22 (×6): 3 mL via INTRAVENOUS

## 2014-08-19 MED ORDER — INSULIN GLARGINE 100 UNIT/ML ~~LOC~~ SOLN
10.0000 [IU] | Freq: Every day | SUBCUTANEOUS | Status: DC
  Filled 2014-08-19: qty 0.1

## 2014-08-19 NOTE — H&P (Addendum)
[facility] History and Physical  [name] DOB: 02-Jun-1933 DOA: 08/19/2014  PCP: [name], MD    Chief Complaint: shortness of breath and cough  HPI: [name] is a 80 y.o. 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  She was previously on anticoagulation but this was discontinued due to GI bleed. 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 Patient is not a very good historian and son at bedside is assisting with history. The patient presents with 3-4 days of dyspnea and pedal edema. She also admits to a cough that's productive of yellowish colored mucus. She has not checked her temperature but has had some hot and cold spells. She is unable to lay flat when sleeping. She has been taking her medications appropriately except for this morning when she was unable to take her medications. She has no other complaints.  General: The patient denies anorexia, fever, weight loss Cardiac: Denies chest pain, syncope, palpitations- admits to pedal edema  Respiratory: metastases to cough shortness of breath at rest and with exertion. No wheezing GI: Denies severe indigestion/heartburn, abdominal pain, nausea, vomiting, diarrhea and constipation GU: Denies hematuria, incontinence, dysuria -feels that she has been urinating more frequently lately Musculoskeletal: Denies arthritis  Skin: Denies suspicious skin lesions Neurologic: Denies focal weakness or numbness, change in vision- she went to see CIR after her recent CVA and is  able to ambulate well with a walker at home per son  Diagnosis  . Asthma  . CAD (coronary artery disease)  . CHF (congestive heart failure) with nonischemic cardiomyopathy  . Osteoporosis  . HYPERTENSION  . GASTROPARESIS, DIABETIC  . HYPERLIPIDEMIA  . OBSTRUCTIVE SLEEP APNEA  . PERIPHERAL NEUROPATHY  . GERD  . LOW BACK PAIN, CHRONIC  . OSTEOPOROSIS  . CEREBRAL EMBOLISM, WITH INFARCTION  . DIABETES MELLITUS, TYPE II  . Lower GI bleeding  . Uterine cancer  . Pulmonary hypertension  . Iron deficiency anemia secondary to blood loss (chronic)  . Nonsustained ventricular tachycardia  . Angiodysplasia of intestine with hemorrhage  . Pacemaker    Past Surgical History  Procedure Laterality Date  . Esophagogastroduodenoscopy  08/26/2011    Procedure: ESOPHAGOGASTRODUODENOSCOPY (EGD);  Surgeon: [name], MD;  Location: [facility] ENDOSCOPY;  Service: Endoscopy;  Laterality: N/A;  . Colonoscopy  08/28/2011    Procedure: COLONOSCOPY;  Surgeon: [name], MD;  Location: [place];  Service: Endoscopy;  Laterality: N/A;  . Vaginal hysterectomy    . Tubal ligation    . Cataract extraction w/ intraocular lens  implant, bilateral    . Toe surgery      "right big toe; operated on it to straighten it out; it was under"  . Polysomnogram  10/17/2005    AHI-7.28/hr. AHI REM-20.8/hr. Average oxygen saturation range during REM and NREM was 97%. Lowest oxygen saturation during REM sleep was 90%.  . Carotid duplex  05/28/2010    No significant extracranial carotid artery stenosis demonstrated. Vertebrals are patent w/ antegrade flow.  . Cardiac catheterization  05/24/2010    No intervention - recommed medical therapy.  . Cardiovascular stress test  08/07/2006    Moderate-severe defect seen in Basal inferior, Mid inferoseptal, Mid inferior, Mid inferolateral, and Apical inferior regions - consistent w/ infarct/scar. No scintigraphic evidence of inducible myocardial ischemia.  . Transthoracic  echocardiogram  08/29/2011    EF 55-60%, moderate LVH,   . Esophagogastroduodenoscopy N/A 02/08/2014    Procedure: ESOPHAGOGASTRODUODENOSCOPY (EGD);  Surgeon: [name], MD;  Location: [facility] ENDOSCOPY;  Service: Endoscopy;  Laterality: N/A;  . Colonoscopy N/A 05/12/2014    Procedure: COLONOSCOPY;  Surgeon: [name], MD;  Location: [place];  Service: Endoscopy;  Laterality: N/A;  . Enteroscopy N/A 05/13/2014    Procedure: ENTEROSCOPY;  Surgeon: [name], MD;  Location: [place];  Service: Endoscopy;  Laterality: N/A;  . Enteroscopy N/A 07/13/2014    Procedure: small bowel enteroscopy;  Surgeon: Jerene Bears, MD;  Location: [facility] ENDOSCOPY;  Service: Endoscopy;  Laterality: N/A;  requests slim colonoscope    Social History: does not smoke or drink alcohol.  Lives at home with her son.    Allergies  Allergen Reactions  . Baking Soda-Fluoride [Sodium Fluoride] Nausea And Vomiting  . Magnesium Hydroxide Nausea And Vomiting    Family History  Problem Relation Age of Onset  . Diabetes insipidus Mother   . Hypertension Mother   . Hypertension Father   . Hypertension Sister   . Hypertension Child   . Stomach cancer Brother       Prior to Admission medications   Medication Sig Start Date End Date Taking? Authorizing Provider  acetaminophen (TYLENOL) 500 MG tablet Take 500 mg by mouth every 6 (six) hours as needed for moderate pain or headache.   Yes Historical Provider, MD  albuterol (PROAIR HFA) 108 (90 BASE) MCG/ACT inhaler Inhale 2 puffs into the lungs every 6 (six) hours as needed for wheezing. 05/25/14  Yes Daniel J Angiulli, PA-C  amLODipine (NORVASC) 5 MG tablet Take 1 tablet (5 mg total) by mouth daily. 05/25/14  Yes Daniel J Angiulli, PA-C  atorvastatin (LIPITOR) 40 MG tablet Take 1 tablet (40 mg total) by mouth daily at 6 PM. 05/25/14  Yes Lavon Paganini Angiulli, PA-C  ferrous sulfate 325 (65 FE) MG tablet Take 1 tablet (325 mg total) by mouth 2 (two) times daily with a  meal. 03/16/14  Yes [name], MD  insulin glargine (LANTUS) 100 UNIT/ML injection 5 units in the a.m. and 8 units each bedtime Patient taking differently: Inject 5-8 Units into the skin 2 (two) times daily. 5 units in the a.m. and 8 units each bedtime 05/25/14  Yes Daniel J Angiulli, PA-C  isosorbide mononitrate (IMDUR) 60 MG 24 hr tablet Take 1 tablet (60 mg total) by mouth daily. 05/25/14  Yes Daniel J Angiulli, PA-C  loratadine (ALLERGY RELIEF) 10 MG tablet Take 10 mg by mouth daily.   Yes Historical Provider, MD  magnesium oxide (MAG-OX) 400 (241.3 MG) MG tablet Take 1 tablet (400 mg total) by mouth 2 (two) times daily. 05/25/14  Yes Daniel J Angiulli, PA-C  metoprolol succinate (TOPROL-XL) 25 MG 24 hr tablet Take 1 tablet (25 mg total) by mouth daily. 07/18/14  Yes Lorretta Harp, MD  Multiple Vitamins-Minerals (ALIVE WOMENS 50+ PO) Take 1 tablet by mouth daily.   Yes Historical Provider, MD  omeprazole (PRILOSEC) 20 MG capsule Take 1 capsule (20 mg total) by mouth daily before breakfast. 05/25/14  Yes Daniel J Angiulli, PA-C  polyethylene glycol (MIRALAX / GLYCOLAX) packet Take 17 g by mouth daily.   Yes  Historical Provider, MD     Physical Exam: Filed Vitals:   08/19/14 1015 08/19/14 1030 08/19/14 1045 08/19/14 1127  BP: 183/82 173/81 172/77 172/79  Pulse: 76 74 74 77  Temp:    97.9 F (36.6 C)  TempSrc:    Oral  Resp: 24 21 21 22   Height:    5\' 2"  (1.575 m)  Weight:    73.7 kg (162 lb 7.7 oz)  SpO2: 100% 100% 100% 100%     General: awake alert oriented 3, no acute distress. HEENT: Normocephalic and Atraumatic, Mucous membranes pink                PERRLA; EOM intact; No scleral icterus,                 Nares: Patent, Oropharynx: Clear, Fair Dentition                 Neck: FROM, no cervical lymphadenopathy, thyromegaly, carotid bruit or JVD;  Breasts: deferred CHEST WALL: No tenderness  CHEST: Normal respiration, crackles in bilateral lower lobes left greater than right.  Oxygen saturations are 100% on 2 L. HEART: Regular rate and rhythm-2/6 murmur at the left lower sternal border BACK: No kyphosis or scoliosis; no CVA tenderness  ABDOMEN: Positive Bowel Sounds, soft, non-tender; no masses, no organomegaly Rectal Exam: deferred EXTREMITIES: No cyanosis, clubbing- +2 pitting edema Genitalia: not examined  SKIN:  no rash or ulceration  CNS: Alert and Oriented x 4, Nonfocal exam, CN 2-12 intact  Labs on Admission:  Basic Metabolic Panel:  Recent Labs Lab 08/19/14 0838  NA 140  K 4.5  CL 106  CO2 20  GLUCOSE 172*  BUN 27*  CREATININE 1.56*  CALCIUM 8.8   Liver Function Tests: No results for input(s): AST, ALT, ALKPHOS, BILITOT, PROT, ALBUMIN in the last 168 hours. No results for input(s): LIPASE, AMYLASE in the last 168 hours. No results for input(s): AMMONIA in the last 168 hours. CBC:  Recent Labs Lab 08/17/14 1047 08/19/14 0838  WBC 6.0 6.8  NEUTROABS 4.9 5.4  HGB 10.7* 10.8*  HCT 33.1* 33.6*  MCV 88.4 88.4  PLT 223.0 246   Cardiac Enzymes: No results for input(s): CKTOTAL, CKMB, CKMBINDEX, TROPONINI in the last 168 hours.  BNP (last 3 results)  Recent Labs  05/10/14 0736 07/11/14 1855 08/19/14 0838  PROBNP 14464.0* 6841.0* 17664.0*   CBG:  Recent Labs Lab 08/19/14 0902 08/19/14 1144  GLUCAP 166* 206*    Radiological Exams on Admission: Dg Chest Port 1 View  08/19/2014   CLINICAL DATA:  Recent productive cough and shortness of breath. Lower extremity swelling. History of diabetes and CHF. Initial encounter.  EXAM: PORTABLE CHEST - 1 VIEW  COMPARISON:  05/10/2014; 05/10/2014; 02/04/2014  FINDINGS: Grossly unchanged enlarged cardiac silhouette. Atherosclerotic plaque within the thoracic aorta. Chronic pulmonary venous congestion without frank evidence of edema. Worsening bibasilar heterogeneous opacities, left greater than right. Trace bilateral effusions are not excluded. No pneumothorax. Unchanged bones.  IMPRESSION: 1.  Worsening bibasilar opacities, left greater than right, atelectasis versus infiltrate. 2. Similar findings of cardiomegaly and pulmonary venous congestion without frank evidence of edema.   Electronically Signed   By: Sandi Mariscal M.D.   On: 08/19/2014 09:22    EKG: Independently reviewed. Sinus rhythm with prolonged QTC and right axis deviation  Assessment/Plan Principal Problem:   Acute respiratory failure - complaints of new onset of productive cough and increasing pedal edema and chest x-ray findings consistent with exacerbation of  heart failure with superimposed pneumonia   (A)  HCAP (healthcare-associated pneumonia) -she was recently in the hospital, we'll need to cover for HCAP and community-acquired pneumonia- been placed on vancomycin and ciprofloxacin -obtain urine strep and Legionella antigens and sputum culture if possible  (B) Acute on chronic combined systolic and diastolic heart failure -diuresis with IV Lasix, monitor I and O's and daily weights- last 2-D echo was in March of this year and revealed an EF of 40-45% and grade 2 diastolic dysfunction- we will repeat echo to ensure that there has not been a decline -Not on Lasix at outpatient and may benefit from this upon discharge -continue ACE inhibitor and beta blocker  Active Problems:   DM (diabetes mellitus), type 2, uncontrolled -we'll continue home dosages of Lantus but cut back slightly as she states that her appetite is poor -place on sliding scale insulin    Obstructive sleep apnea -Will order C Pap at bedtime    Essential hypertension -continue home meds and modify as needed    CVA (cerebral infarction) -Not on anticoagulation due to GI bleed    Atrial fibrillation -Currently in sinus rhythm -As mentioned above not on anticoagulation due to GI bleed    Angiodysplasia of intestine with hemorrhage -Continue iron for iron deficiency anemia - hold off anticoagulants     Consulted: none  Code Status: Full  code  Family Communication: with son at bedside  DVT Prophylaxis: SCDs- will try to avoid anticoagulants due to recurrent GI bleeds  Time spent: >45 minutes  [place], MD [facility]  If 7PM-7AM, please contact night-coverage [email or web address] 08/19/2014, 12:23 PM

## 2014-08-19 NOTE — ED Provider Notes (Signed)
TIME SEEN: 8:20 AM  CHIEF COMPLAINT: cough, shortness of breath  HPI: Pt is a 80 y.o. F with history of coronary artery disease, CHF, hypertension, hyperlipidemia, diabetes, prior CVA who presents emergency department with 4 days of shortness of breath, productive cough. Patient will not answer many questions and most of the history is obtained from her son. Patient's son reports that she has not had any fevers that he is aware of. Patient denies any chest pain or abdominal pain. No vomiting or diarrhea. No sick contacts or recent travel. Son reports the patient lives with him. She did see her PCP 2 days ago.  PCP is Dr. Iona Beard at [facility]: See HPI Constitutional: no fever  Eyes: no drainage  ENT: no runny nose   Cardiovascular:  no chest pain  Resp:  SOB  GI: no vomiting GU: no dysuria Integumentary: no rash  Allergy: no hives  Musculoskeletal: no leg swelling  Neurological: no slurred speech ROS otherwise negative  PAST MEDICAL HISTORY/PAST SURGICAL HISTORY:  Past Medical History  Diagnosis Date  . Asthma   . CAD (coronary artery disease)     Pt reports MI in 2006 (no documentation).  Cardiolite in 05/2002 and 07/2006 did not reveal any reversible ischemia.  Pt [facility] with [facility] Rex [facility] at [facility].  . CHF (congestive heart failure)     EF 25-30% with dilated LV, mild LVH, severe hypokinesis, and mod-severe reduction in RV function  . Osteoporosis   . HYPERTENSION 08/03/2006  . GASTROPARESIS, DIABETIC 08/03/2006  . HYPERLIPIDEMIA 08/03/2006  . OBSTRUCTIVE SLEEP APNEA 01/06/2008  . PERIPHERAL NEUROPATHY 08/03/2006  . GERD 08/03/2006  . LOW BACK PAIN, CHRONIC 08/03/2006  . OSTEOPOROSIS 03/21/2009  . CEREBRAL EMBOLISM, WITH INFARCTION 07/02/2010  . Angina   . Myocardial infarction "2 or 3"  . Pneumonia 02/26/12    "a few times; probably even today"  . Shortness of breath     "all the time"  . DIABETES MELLITUS, TYPE II 11/04/1983  . Blood transfusion 08/2011  . Lower  GI bleeding 08/2011 and 01/2014  . Chronic daily headache   . Migraines   . Stroke summer 2011    "made my left hip worse"  . Uterine cancer   . Nonischemic cardiomyopathy 05/2010    Left heart catheterization:2011. Nonobstructive coronary artery disease.  . Pulmonary hypertension   . Hypertension   . NSTEMI (non-ST elevated myocardial infarction) 01/2014    type II with CVA  . Acute embolic stroke 11/04/4823  . Non-ST elevation MI (NSTEMI) 02/04/2014  . E. coli UTI 03/02/2014  . Renal failure, acute 02/04/2014  . Iron deficiency anemia secondary to blood loss (chronic) 01/06/2008    Qualifier: Diagnosis of  By: Tomasa Hosteller MD, Veronique D.   . Nonsustained ventricular tachycardia   . Angiodysplasia of intestine with hemorrhage 05/13/2014  . Pacemaker     MEDICATIONS:  Prior to Admission medications   Medication Sig Start Date End Date Taking? Authorizing Provider  acetaminophen (TYLENOL) 500 MG tablet Take 500 mg by mouth every 6 (six) hours as needed for moderate pain or headache.    Historical Provider, MD  albuterol (PROAIR HFA) 108 (90 BASE) MCG/ACT inhaler Inhale 2 puffs into the lungs every 6 (six) hours as needed for wheezing. 05/25/14   Lavon Paganini Angiulli, PA-C  amLODipine (NORVASC) 5 MG tablet Take 1 tablet (5 mg total) by mouth daily. 05/25/14   Lavon Paganini Angiulli, PA-C  atorvastatin (LIPITOR) 40 MG tablet Take 1 tablet (  40 mg total) by mouth daily at 6 PM. 05/25/14   Lavon Paganini Angiulli, PA-C  ferrous sulfate 325 (65 FE) MG tablet Take 1 tablet (325 mg total) by mouth 2 (two) times daily with a meal. 03/16/14   [name], MD  insulin glargine (LANTUS) 100 UNIT/ML injection 5 units in the a.m. and 8 units each bedtime 05/25/14   Lavon Paganini Angiulli, PA-C  isosorbide mononitrate (IMDUR) 60 MG 24 hr tablet Take 1 tablet (60 mg total) by mouth daily. 05/25/14   Lavon Paganini Angiulli, PA-C  loratadine (ALLERGY RELIEF) 10 MG tablet Take 10 mg by mouth daily.    Historical Provider, MD  magnesium oxide  (MAG-OX) 400 (241.3 MG) MG tablet Take 1 tablet (400 mg total) by mouth 2 (two) times daily. 05/25/14   Lavon Paganini Angiulli, PA-C  metoprolol succinate (TOPROL-XL) 25 MG 24 hr tablet Take 1 tablet (25 mg total) by mouth daily. 07/18/14   Lorretta Harp, MD  Multiple Vitamins-Minerals (ALIVE WOMENS 50+ PO) Take 1 tablet by mouth daily.    Historical Provider, MD  omeprazole (PRILOSEC) 20 MG capsule Take 1 capsule (20 mg total) by mouth daily before breakfast. 05/25/14   Lavon Paganini Angiulli, PA-C  polyethylene glycol (MIRALAX / GLYCOLAX) packet Take 17 g by mouth daily.    Historical Provider, MD    ALLERGIES:  Allergies  Allergen Reactions  . Baking Soda-Fluoride [Sodium Fluoride] Nausea And Vomiting  . Magnesium Hydroxide Nausea And Vomiting    SOCIAL HISTORY:  History  Substance Use Topics  . Smoking status: Never Smoker   . Smokeless tobacco: Former Systems developer    Types: Snuff    Quit date: 10/13/2006     Comment: 02/26/12 "stopped snuff 4-6 years ago"  . Alcohol Use: No     Comment: "stopped drinking alcohol ~ 1980's"    FAMILY HISTORY: Family History  Problem Relation Age of Onset  . Diabetes insipidus Mother   . Hypertension Mother   . Hypertension Father   . Hypertension Sister   . Hypertension Child   . Stomach cancer Brother     EXAM: BP 150/86 mmHg  Pulse 79  Temp(Src) 98.2 F (36.8 C) (Oral)  Resp 24  Ht 5\' 2"  (1.575 m)  Wt 154 lb (69.854 kg)  BMI 28.16 kg/m2  SpO2 100%  LMP 12/19/1968 CONSTITUTIONAL: Alert and oriented and will only answer yes or no, will not talk any further; appears in very mild respiratory distress, elderly, nontoxic  HEAD: Normocephalic EYES: Conjunctivae clear, PERRL; there is some yellow discharge at the medial canthus of the left eye; sclera are not injected; no periorbital swelling or erythema; extraocular movements intact ENT: normal nose; no rhinorrhea; moist mucous membranes; pharynx without lesions noted NECK: Supple, no meningismus, no  LAD  CARD: RRR; S1 and S2 appreciated; no murmurs, no clicks, no rubs, no gallops RESP: Normal chest excursion without splinting; patient is tachypneic With diffuse rhonchi and wheezing, patient is not hypoxic but does appear to be in mild respiratory distress with some mild increased work of breathing ABD/GI: Normal bowel sounds; non-distended; soft, non-tender, no rebound, no guarding BACK:  The back appears normal and is non-tender to palpation, there is no CVA tenderness EXT: Normal ROM in all joints; non-tender to palpation; no edema; normal capillary refill; no cyanosis; swelling or tenderness    SKIN: Normal color for age and race; warm NEURO: Moves all extremities equally PSYCH: The patient's mood and manner are appropriate. Grooming and personal  hygiene are appropriate.  MEDICAL DECISION MAKING: Pt here with possible pneumonia versus CHF exacerbation. Son reports patient does have a prior history of asthma but no history of COPD or tobacco use. We'll give DuoNeb treatment, obtain labs with cultures and lactate. We'll obtain chest x-ray. EKG shows no significant changes.  ED PROGRESS: Patient's chest x-ray shows worsening bibasilar opacities left greater than right concerning for infiltrate versus atelectasis. Given her productive cough and predominant neutrophils, I'm concerned for infiltrate. Will treat for healthcare associated pneumonia with vancomycin and cefepime. Her labs also showed elevation of her BNP at 17,000. She has a volume overload on exam. Will give IV Lasix. Given she is tachypneic, elderly and has multiple comorbidities, I feel she needs admission to the hospital.  PCP is with [facility]. Will admit to hospitalist.   Work of breathing, respiratory rate improved after DuoNeb.   10:16 AM  D/w Dr. Wynelle Cleveland for admission to tele, inpt.   EKG Interpretation  Date/Time:  Saturday August 19 2014 07:59:40 EST Ventricular Rate:  78 PR Interval:  194 QRS Duration: 146 QT  Interval:  468 QTC Calculation: 533 R Axis:   -99 Text Interpretation:  Normal sinus rhythm Possible Left atrial enlargement Right superior axis deviation Non-specific intra-ventricular conduction block Abnormal ECG No significant change since last tracing Confirmed by Dajuan Turnley,  DO, Jacob Chamblee ([id number]) on 08/19/2014 8:10:36 AM        CRITICAL CARE Performed by: Nyra Jabs   Total critical care time: 30 minutes  Critical care time was exclusive of separately billable procedures and treating other patients.  Critical care was necessary to treat or prevent imminent or life-threatening deterioration.  Pt wheezing, tachypnea, mild respiratory distress, healthcare associated pneumonia  Critical care was time spent personally by me on the following activities: development of treatment plan with patient and/or surrogate as well as nursing, discussions with consultants, evaluation of patient's response to treatment, examination of patient, obtaining history from patient or surrogate, ordering and performing treatments and interventions, ordering and review of laboratory studies, ordering and review of radiographic studies, pulse oximetry and re-evaluation of patient's condition.    [place], DO 08/19/14 1017

## 2014-08-19 NOTE — Progress Notes (Signed)
Patient arrived on unit from ED, BP 172/79, vitals otherwise stable.  Dr. Wynelle Cleveland aware and at bedside.  Patient alert and oriented x2, disoriented to time and situation.  Son at bedside.  Will continue to monitor.

## 2014-08-19 NOTE — Plan of Care (Signed)
Problem: Phase I Progression Outcomes Goal: Pain controlled with appropriate interventions Outcome: Completed/Met Date Met:  08/19/14 Goal: OOB as tolerated unless otherwise ordered Outcome: Completed/Met Date Met:  08/19/14 Patient up to [facility] with one assist Goal: First antibiotic given within 6hrs of admit Outcome: Completed/Met Date Met:  08/19/14 Goal: Confirm chest x-ray completed Outcome: Completed/Met Date Met:  08/19/14 Goal: Voiding-avoid urinary catheter unless indicated Outcome: Completed/Met Date Met:  08/19/14 Goal: Hemodynamically stable Outcome: Completed/Met Date Met:  08/19/14 Goal: Other Phase I Outcomes/Goals Outcome: Not Applicable Date Met:  57/97/28

## 2014-08-19 NOTE — Progress Notes (Addendum)
ANTIBIOTIC CONSULT NOTE - INITIAL  Pharmacy Consult:  Vancomycin / Cefepime Indication:  PNA  Allergies  Allergen Reactions  . Baking Soda-Fluoride [Sodium Fluoride] Nausea And Vomiting  . Magnesium Hydroxide Nausea And Vomiting    Patient Measurements: Height: 5\' 2"  (157.5 cm) Weight: 154 lb (69.854 kg) IBW/kg (Calculated) : 50.1  Vital Signs: Temp: 98.2 F (36.8 C) (11/07 0803) Temp Source: Oral (11/07 0803) BP: 175/77 mmHg (11/07 0915) Pulse Rate: 74 (11/07 0915)  Labs:  Recent Labs  08/17/14 1047 08/19/14 0838  WBC 6.0 6.8  HGB 10.7* 10.8*  PLT 223.0 246  CREATININE  --  1.56*   Estimated Creatinine Clearance: 26.8 mL/min (by C-G formula based on Cr of 1.56). No results for input(s): VANCOTROUGH, VANCOPEAK, VANCORANDOM, GENTTROUGH, GENTPEAK, GENTRANDOM, TOBRATROUGH, TOBRAPEAK, TOBRARND, AMIKACINPEAK, AMIKACINTROU, AMIKACIN in the last 72 hours.   Microbiology: No results found for this or any previous visit (from the past 720 hour(s)).  Medical History: Past Medical History  Diagnosis Date  . Asthma   . CAD (coronary artery disease)     Pt reports MI in 2006 (no documentation).  Cardiolite in 05/2002 and 07/2006 did not reveal any reversible ischemia.  Pt follows with Dr. Rex Kras at [facility].  . CHF (congestive heart failure)     EF 25-30% with dilated LV, mild LVH, severe hypokinesis, and mod-severe reduction in RV function  . Osteoporosis   . HYPERTENSION 08/03/2006  . GASTROPARESIS, DIABETIC 08/03/2006  . HYPERLIPIDEMIA 08/03/2006  . OBSTRUCTIVE SLEEP APNEA 01/06/2008  . PERIPHERAL NEUROPATHY 08/03/2006  . GERD 08/03/2006  . LOW BACK PAIN, CHRONIC 08/03/2006  . OSTEOPOROSIS 03/21/2009  . CEREBRAL EMBOLISM, WITH INFARCTION 07/02/2010  . Angina   . Myocardial infarction "2 or 3"  . Pneumonia 02/26/12    "a few times; probably even today"  . Shortness of breath     "all the time"  . DIABETES MELLITUS, TYPE II 11/04/1983  . Blood transfusion 08/2011  . Lower  GI bleeding 08/2011 and 01/2014  . Chronic daily headache   . Migraines   . Stroke summer 2011    "made my left hip worse"  . Uterine cancer   . Nonischemic cardiomyopathy 05/2010    Left heart catheterization:2011. Nonobstructive coronary artery disease.  . Pulmonary hypertension   . Hypertension   . NSTEMI (non-ST elevated myocardial infarction) 01/2014    type II with CVA  . Acute embolic stroke 4/33/2951  . Non-ST elevation MI (NSTEMI) 02/04/2014  . E. coli UTI 03/02/2014  . Renal failure, acute 02/04/2014  . Iron deficiency anemia secondary to blood loss (chronic) 01/06/2008    Qualifier: Diagnosis of  By: Tomasa Hosteller MD, Veronique D.   . Nonsustained ventricular tachycardia   . Angiodysplasia of intestine with hemorrhage 05/13/2014  . Pacemaker       Assessment: 66 YOF admitted with complaints of productive cough and SOB to start vancomycin and cefepime for HCAP.  Patient's renal function is improving compared to previous admissions.   Goal of Therapy:  Vancomycin trough level 15-20 mcg/ml   Plan:  - Vanc 1gm IV x 1, then 750mg  IV Q24H - Cefepime 1gm IV Q24H - Monitor renal fxn, clinical progress, vanc trough as indicated    Thuy D. Mina Marble, PharmD, BCPS Pager:  [phone] 08/19/2014, 10:08 AM    ================================   Addendum: - add Levaquin, d/c cefepime   Plan: - LVQ 750mg  IV x 1, then 500mg  IV Q48H - Monitor renal fxn    Thuy D.  Mina Marble, PharmD, BCPS Pager:  [phone] 08/19/2014, 10:17 AM   Addendum:  MD wants to switch cefepime and levaquin to cipro for CAP.  - Cipro 400 mg iv q24h.  Ysidro Evert Colbin Jovel, PharmD, BCPS

## 2014-08-19 NOTE — ED Notes (Signed)
Pt very vague but reports recent cough and went to pcp two days ago. Having productive cough and sob, swelling to lower extremities. Airway intact at triage, ekg being done.

## 2014-08-20 DIAGNOSIS — I481 Persistent atrial fibrillation: Secondary | ICD-10-CM

## 2014-08-20 DIAGNOSIS — E1165 Type 2 diabetes mellitus with hyperglycemia: Secondary | ICD-10-CM

## 2014-08-20 DIAGNOSIS — I059 Rheumatic mitral valve disease, unspecified: Secondary | ICD-10-CM

## 2014-08-20 LAB — EXPECTORATED SPUTUM ASSESSMENT W REFEX TO RESP CULTURE: Special Requests: NORMAL

## 2014-08-20 LAB — BASIC METABOLIC PANEL
ANION GAP: 12 (ref 5–15)
BUN: 26 mg/dL — AB (ref 6–23)
CALCIUM: 8.7 mg/dL (ref 8.4–10.5)
CHLORIDE: 106 meq/L (ref 96–112)
CO2: 23 mEq/L (ref 19–32)
CREATININE: 1.77 mg/dL — AB (ref 0.50–1.10)
GFR calc non Af Amer: 26 mL/min — ABNORMAL LOW (ref >90)
GFR, EST AFRICAN AMERICAN: 30 mL/min — AB (ref >90)
Glucose, Bld: 67 mg/dL — ABNORMAL LOW (ref 70–99)
Potassium: 4.4 mEq/L (ref 3.7–5.3)
Sodium: 141 mEq/L (ref 137–147)

## 2014-08-20 LAB — GLUCOSE, CAPILLARY
GLUCOSE-CAPILLARY: 81 mg/dL (ref 70–99)
GLUCOSE-CAPILLARY: 91 mg/dL (ref 70–99)
GLUCOSE-CAPILLARY: 205 mg/dL — AB (ref 70–99)
Glucose-Capillary: 68 mg/dL — ABNORMAL LOW (ref 70–99)
Glucose-Capillary: 94 mg/dL (ref 70–99)
Glucose-Capillary: 52 mg/dL — ABNORMAL LOW (ref 70–99)
Glucose-Capillary: 178 mg/dL — ABNORMAL HIGH (ref 70–99)
Glucose-Capillary: 181 mg/dL — ABNORMAL HIGH (ref 70–99)

## 2014-08-20 MED ORDER — ASPIRIN 81 MG PO CHEW
81.0000 mg | CHEWABLE_TABLET | Freq: Every day | ORAL | Status: DC
  Administered 2014-08-20 – 2014-08-22 (×3): 81 mg via ORAL
  Filled 2014-08-20 (×3): qty 1

## 2014-08-20 MED ORDER — FUROSEMIDE 10 MG/ML IJ SOLN: 40.0000 mg | Freq: Every day | INTRAMUSCULAR | Status: DC

## 2014-08-20 MED ORDER — FUROSEMIDE 40 MG PO TABS
40.0000 mg | ORAL_TABLET | Freq: Every day | ORAL | Status: DC
  Administered 2014-08-21: 40 mg via ORAL
  Filled 2014-08-20: qty 1

## 2014-08-20 MED ORDER — GUAIFENESIN 100 MG/5ML PO SYRP
200.0000 mg | ORAL_SOLUTION | ORAL | Status: DC | PRN
  Administered 2014-08-20 – 2014-08-22 (×3): 200 mg via ORAL
  Filled 2014-08-20 (×4): qty 10

## 2014-08-20 MED ORDER — BENZONATATE 100 MG PO CAPS
100.0000 mg | ORAL_CAPSULE | Freq: Three times a day (TID) | ORAL | Status: DC
  Administered 2014-08-20 – 2014-08-22 (×8): 100 mg via ORAL
  Filled 2014-08-20 (×9): qty 1

## 2014-08-20 NOTE — Evaluation (Signed)
Physical Therapy Evaluation Patient Details Name: [name] MRN: [id number] DOB: 10-21-1932 Today's Date: 08/20/2014   History of Present Illness   80 yo F presents with 3-4 days of dyspnea and pedal edema. She also admits to a cough that's productive of yellowish colored mucus. She has not checked her temperature but has had some hot and cold spells. She is unable to lay flat when sleeping. She has been taking her medications appropriately except for this morning when she was unable to take her medications. She has no other complaints.  Clinical Impression  Pt presents at/near baseline for functional mobility.  She is able to walk with supervision, transfer surface to surface and transition/change body positions with little to no physical assistance.  Per family and patient, she is 'like she was before' and they decline HHPT services at this time.  Recommend up with nursing, use RW for ambulation/transfers and OOB as tolerated. No further acute PT needs, RN verbally updated.      Follow Up Recommendations No PT follow up (could benefit from HHPT however at baseline per family)    Equipment Recommendations  None recommended by PT    Recommendations for Other Services       Precautions / Restrictions Precautions Precautions: Fall      Mobility  Bed Mobility Overal bed mobility:  (not observed, pt up in and back to chair)                Transfers Overall transfer level: Needs assistance Equipment used: Rolling walker (2 wheeled) Transfers: Sit to/from Stand Sit to Stand: Supervision         General transfer comment: pt able to achieve standing with verbal cues and transition to RW with no apparent difficutly, and able to control descent with use of hands  Ambulation/Gait Ambulation/Gait assistance: Min guard Ambulation Distance (Feet): 50 Feet Assistive device: Rolling walker (2 wheeled) Gait Pattern/deviations: Step-through pattern;Decreased stride length;Narrow  base of support (essential tremor with gait)   Gait velocity interpretation: Below normal speed for age/gender General Gait Details: upon initiation of gait pt with obvious whole body tremor, able to ambulate short distances with standing rest breaks  Stairs            Wheelchair Mobility    Modified Rankin (Stroke Patients Only)       Balance Overall balance assessment: Needs assistance Sitting-balance support: Feet supported;No upper extremity supported Sitting balance-Leahy Scale: Good     Standing balance support: During functional activity;Bilateral upper extremity supported Standing balance-Leahy Scale: Fair                               Pertinent Vitals/Pain      Home Living Family/patient expects to be discharged to:: Private residence Living Arrangements: Children Available Help at Discharge: Family;Available 24 hours/day Type of Home: House Home Access: Stairs to enter Entrance Stairs-Rails: None Entrance Stairs-Number of Steps: 1 Home Layout: One level Home Equipment: Walker - 2 wheels;Shower seat;Bedside commode Additional Comments: son reports he and other family members A pt prn with BADLs (but usually she can do them by herself)    Prior Function Level of Independence: Needs assistance   Gait / Transfers Assistance Needed: supervision with RW  ADL's / Homemaking Assistance Needed: grandaughter reports patient needs assist with bathing  Comments: as per daughter at bedside, pt was transfering and ambulating with rolling walker and S, however has had several falls recently  Hand Dominance   Dominant Hand: Right    Extremity/Trunk Assessment   Upper Extremity Assessment: Overall WFL for tasks assessed           Lower Extremity Assessment: Overall WFL for tasks assessed;Generalized weakness         Communication   Communication: No difficulties  Cognition Arousal/Alertness: Awake/alert Behavior During Therapy: WFL  for tasks assessed/performed Overall Cognitive Status: Within Functional Limits for tasks assessed                      General Comments      Exercises        Assessment/Plan    PT Assessment Patent does not need any further PT services;All further PT needs can be met in the next venue of care  PT Diagnosis Abnormality of gait   PT Problem List    PT Treatment Interventions     PT Goals (Current goals can be found in the Care Plan section) Acute Rehab PT Goals PT Goal Formulation: All assessment and education complete, DC therapy    Frequency     Barriers to discharge        Co-evaluation               End of Session Equipment Utilized During Treatment: Gait belt Activity Tolerance: Patient tolerated treatment well;Patient limited by fatigue Patient left: in chair;with call bell/phone within reach;with family/visitor present;with chair alarm set Nurse Communication: Mobility status         Time: 1357-1420 PT Time Calculation (min): 23 min   Charges:   PT Evaluation $Initial PT Evaluation Tier I: 1 Procedure PT Treatments $Gait Training: 8-22 mins $Therapeutic Activity: 8-22 mins   PT G Codes:          Herbie Drape 08/20/2014, 2:23 PM

## 2014-08-20 NOTE — Progress Notes (Signed)
Pt at rest 98% on room air, tolerating well, no c/o SOB, pt stable

## 2014-08-20 NOTE — Progress Notes (Signed)
Echo Lab  2D Echocardiogram completed.  [place], RDCS 08/20/2014 10:39 AM

## 2014-08-20 NOTE — Plan of Care (Signed)
Problem: Phase I Progression Outcomes Goal: Dyspnea controlled at rest Outcome: Completed/Met Date Met:  08/20/14  Problem: Phase II Progression Outcomes Goal: Pain controlled Outcome: Not Applicable Date Met:  87/21/58 Goal: Tolerating diet Outcome: Completed/Met Date Met:  08/20/14

## 2014-08-20 NOTE — Progress Notes (Signed)
15 beat run of VT asymtomatic VSS Dr Rogue Bussing notified

## 2014-08-20 NOTE — Progress Notes (Signed)
Agree with Ms. Esterwood's assessment and plan. Quanell Loughney E. Auburn Hester, MD, FACG   

## 2014-08-20 NOTE — Progress Notes (Signed)
Pharmacist Heart Failure Core Measure Documentation  Assessment:  [name] has an EF documented as 30-35% on 08/20/14 by ECHO.  Rationale: Heart failure patients with left ventricular systolic dysfunction (LVSD) and an EF < 40% should be prescribed an angiotensin converting enzyme inhibitor (ACEI) or angiotensin receptor blocker (ARB) at discharge unless a contraindication is documented in the medical record.  This patient is not currently on an ACEI or ARB for HF.  This note is being placed in the record in order to provide documentation that a contraindication to the use of these agents is present for this encounter.  ACE Inhibitor or Angiotensin Receptor Blocker is contraindicated (specify all that apply)  []   ACEI allergy AND ARB allergy []   Angioedema []   Moderate or severe aortic stenosis []   Hyperkalemia []   Hypotension []   Renal artery stenosis [x]   Worsening renal function, preexisting renal disease or dysfunction   [name], Tsz-Yin 08/20/2014 12:18 PM

## 2014-08-20 NOTE — Progress Notes (Addendum)
[facility] Progress Note   RANDY WHITENER YQI:[id number] DOB: 06/15/1933 DOA: 08/19/2014 PCP: [name], MD  Brief narrative: Cheryl ROLLO is a 80 y.o. female with extensive past medical history brought to the hospital for shortness of breath,off productive of yellow mucus and pedal edema.   Subjective: Breathing much better continues to have a cough which is bothering her and keeping her from sleeping at night.  Assessment/Plan: Acute respiratory failure - complaints of new onset of productive cough and increasing pedal edema and chest x-ray findings consistent with exacerbation of heart failure with superimposed pneumonia  (A) HCAP (healthcare-associated pneumonia) -she was recently in the hospital- covering for for HCAP and community-acquired pneumonia- been placed on vancomycin and ciprofloxacin - urine strep- negative -  Legionella antigen pending -  sputum culture if possible (B) Acute on chronic combined systolic and diastolic heart failure -diuresed well with IV Lasix- In negative balance by 1200 mL- has diuresed adequately and now- pulse ox is 98% on room air - last 2-D echo was in March of this year and revealed an EF of 40-45% and grade 2 diastolic dysfunction-  - repeat echo reveals that there has been a decline in EF which is now 30-35%-still has grade 2 diastolic dysfunction, severe mitral regurgitation moderate pulmonary hypertension and moderately reduced RV function -Not on Lasix at outpatient and may benefit from this upon discharge -continue ACE inhibitor and beta blocker  Active Problems:  DM (diabetes mellitus), type 2, uncontrolled -spite decreasing Lantus home dose, sugars were low this morning-have omitted her morning dose of Lantus completely -continue to follow on sliding scale insulin   Obstructive sleep apnea - C Pap at bedtime   Essential hypertension -continue home meds and modify as needed   CVA (cerebral infarction) -aspirin 81  mg only due to recent GI bleed   Atrial fibrillation -Currently in sinus rhythm --aspirin 81 mg only due to recent GI bleed   Angiodysplasia of intestine with hemorrhage -Continue iron for iron deficiency anemia -     Code Status:full code Family Communication: son in room  Disposition Plan: home  DVT prophylaxis: SCDs  Consultants: none  Procedures: none  Antibiotics: Anti-infectives    Start     Dose/Rate Route Frequency Ordered Stop   08/21/14 1200  levofloxacin (LEVAQUIN) IVPB 500 mg  Status:  Discontinued     500 mg100 mL/hr over 60 Minutes Intravenous Every 48 hours 08/19/14 1018 08/19/14 1244   08/20/14 1100  vancomycin (VANCOCIN) IVPB 750 mg/150 ml premix     750 mg150 mL/hr over 60 Minutes Intravenous Every 24 hours 08/19/14 1009     08/20/14 1100  ceFEPIme (MAXIPIME) 1 g in dextrose 5 % 50 mL IVPB  Status:  Discontinued     1 g100 mL/hr over 30 Minutes Intravenous Every 24 hours 08/19/14 1009 08/19/14 1014   08/19/14 1500  ciprofloxacin (CIPRO) IVPB 400 mg     400 mg200 mL/hr over 60 Minutes Intravenous Every 24 hours 08/19/14 1318     08/19/14 1030  levofloxacin (LEVAQUIN) IVPB 750 mg  Status:  Discontinued     750 mg100 mL/hr over 90 Minutes Intravenous  Once 08/19/14 1018 08/19/14 1244   08/19/14 1015  vancomycin (VANCOCIN) IVPB 1000 mg/200 mL premix     1,000 mg200 mL/hr over 60 Minutes Intravenous  Once 08/19/14 1009 08/19/14 1125   08/19/14 1015  ceFEPIme (MAXIPIME) 1 g in dextrose 5 % 50 mL IVPB  Status:  Discontinued  1 g100 mL/hr over 30 Minutes Intravenous  Once 08/19/14 1009 08/19/14 1014         Objective: Filed Weights   08/19/14 0803 08/19/14 1127 08/20/14 0427  Weight: 69.854 kg (154 lb) 73.7 kg (162 lb 7.7 oz) 72.258 kg (159 lb 4.8 oz)    Intake/Output Summary (Last 24 hours) at 08/20/14 1416 Last data filed at 08/20/14 1300  Gross per 24 hour  Intake    360 ml  Output   1750 ml  Net  -1390 ml     Vitals Filed Vitals:    08/20/14 0427 08/20/14 0437 08/20/14 0510 08/20/14 1008  BP:  127/53 140/66 134/57  Pulse:  66 67 64  Temp:  98.6 F (37 C)    TempSrc:  Oral    Resp:  18 22   Height:      Weight: 72.258 kg (159 lb 4.8 oz)     SpO2:  100% 100%     Exam: General: alert oriented 3- No acute respiratory distress Lungs: Clear to auscultation bilaterally without wheezes or crackles- and 10 used to have a cough Cardiovascular: Regular rate and rhythm without murmur gallop or rub normal S1 and S2 Abdomen: Nontender, nondistended, soft, bowel sounds positive, no rebound, no ascites, no appreciable mass Extremities: No significant cyanosis, clubbing, or edema bilateral lower extremities  Data Reviewed: Basic Metabolic Panel:  Recent Labs Lab 08/19/14 0838 08/19/14 1200 08/20/14 0418  NA 140  --  141  K 4.5  --  4.4  CL 106  --  106  CO2 20  --  23  GLUCOSE 172*  --  67*  BUN 27*  --  26*  CREATININE 1.56*  --  1.77*  CALCIUM 8.8  --  8.7  MG  --  2.0  --    Liver Function Tests: No results for input(s): AST, ALT, ALKPHOS, BILITOT, PROT, ALBUMIN in the last 168 hours. No results for input(s): LIPASE, AMYLASE in the last 168 hours. No results for input(s): AMMONIA in the last 168 hours. CBC:  Recent Labs Lab 08/17/14 1047 08/19/14 0838  WBC 6.0 6.8  NEUTROABS 4.9 5.4  HGB 10.7* 10.8*  HCT 33.1* 33.6*  MCV 88.4 88.4  PLT 223.0 246   Cardiac Enzymes: No results for input(s): CKTOTAL, CKMB, CKMBINDEX, TROPONINI in the last 168 hours. BNP (last 3 results)  Recent Labs  05/10/14 0736 07/11/14 1855 08/19/14 0838  PROBNP 14464.0* 6841.0* 17664.0*   CBG:  Recent Labs Lab 08/19/14 2211 08/20/14 0111 08/20/14 0608 08/20/14 0651 08/20/14 1105  GLUCAP 81 94 52* 91 178*    Recent Results (from the past 240 hour(s))  Culture, expectorated sputum-assessment     Status: None   Collection Time: 08/20/14  1:17 AM  Result Value Ref Range Status   Specimen Description SPUTUM  Final    Special Requests Normal  Final   Sputum evaluation   Final    THIS SPECIMEN IS ACCEPTABLE. RESPIRATORY CULTURE REPORT TO FOLLOW.   Report Status 08/20/2014 FINAL  Final     Studies:  Recent x-ray studies have been reviewed in detail by the Attending Physician  Scheduled Meds:  Scheduled Meds: . amLODipine  5 mg Oral Daily  . antiseptic oral rinse  7 mL Mouth Rinse BID  . aspirin  81 mg Oral Daily  . atorvastatin  40 mg Oral q1800  . benzonatate  100 mg Oral TID  . ciprofloxacin  400 mg Intravenous Q24H  . ferrous sulfate  325 mg Oral BID WC  . [START ON 08/21/2014] furosemide  40 mg Intravenous Daily  . insulin aspart  0-15 Units Subcutaneous TID WC  . insulin aspart  0-5 Units Subcutaneous QHS  . insulin glargine  5 Units Subcutaneous QHS  . isosorbide mononitrate  60 mg Oral Daily  . magnesium oxide  400 mg Oral BID  . metoprolol succinate  25 mg Oral Daily  . pantoprazole  40 mg Oral Daily  . polyethylene glycol  17 g Oral Daily  . sodium chloride  3 mL Intravenous Q12H  . vancomycin  750 mg Intravenous Q24H   Continuous Infusions:   Time spent on care of this patient: 35 minutes   [place], MD 08/20/2014, 2:16 PM  LOS: 1 day   [facility] Office  [phone] Pager - Text Page per [email or web address]  If 7PM-7AM, please contact night-coverage [email or web address]

## 2014-08-20 NOTE — Progress Notes (Signed)
Pt a/o, no c/o pain, pt has cough PRN robitussin given and tessalon pearls given as ordered, pt oob to chair, pt being weaned off O2, no c/o SOB, pt to use bathroom to start moving more, pt stable

## 2014-08-21 ENCOUNTER — Inpatient Hospital Stay (HOSPITAL_COMMUNITY): Payer: Medicare Other

## 2014-08-21 ENCOUNTER — Encounter (HOSPITAL_COMMUNITY): Payer: Self-pay | Admitting: Cardiology

## 2014-08-21 DIAGNOSIS — F72 Severe intellectual disabilities: Secondary | ICD-10-CM

## 2014-08-21 DIAGNOSIS — N184 Chronic kidney disease, stage 4 (severe): Secondary | ICD-10-CM | POA: Diagnosis present

## 2014-08-21 HISTORY — DX: Chronic kidney disease, stage 4 (severe): N18.4

## 2014-08-21 LAB — BASIC METABOLIC PANEL
ANION GAP: 13 (ref 5–15)
BUN: 30 mg/dL — ABNORMAL HIGH (ref 6–23)
CO2: 21 mEq/L (ref 19–32)
Calcium: 8.6 mg/dL (ref 8.4–10.5)
Chloride: 100 mEq/L (ref 96–112)
Creatinine, Ser: 1.96 mg/dL — ABNORMAL HIGH (ref 0.50–1.10)
GFR calc Af Amer: 27 mL/min — ABNORMAL LOW (ref >90)
GFR, EST NON AFRICAN AMERICAN: 23 mL/min — AB (ref >90)
GLUCOSE: 221 mg/dL — AB (ref 70–99)
Potassium: 5 mEq/L (ref 3.7–5.3)
SODIUM: 134 meq/L — AB (ref 137–147)

## 2014-08-21 LAB — GLUCOSE, CAPILLARY
Glucose-Capillary: 199 mg/dL — ABNORMAL HIGH (ref 70–99)
Glucose-Capillary: 129 mg/dL — ABNORMAL HIGH (ref 70–99)
Glucose-Capillary: 190 mg/dL — ABNORMAL HIGH (ref 70–99)
Glucose-Capillary: 174 mg/dL — ABNORMAL HIGH (ref 70–99)

## 2014-08-21 MED ORDER — HYDRALAZINE HCL 25 MG PO TABS
12.5000 mg | ORAL_TABLET | Freq: Three times a day (TID) | ORAL | Status: DC
  Administered 2014-08-21 – 2014-08-22 (×4): 12.5 mg via ORAL
  Filled 2014-08-21 (×6): qty 0.5

## 2014-08-21 MED ORDER — CIPROFLOXACIN HCL 500 MG PO TABS
500.0000 mg | ORAL_TABLET | Freq: Every day | ORAL | Status: DC
  Administered 2014-08-21 – 2014-08-22 (×2): 500 mg via ORAL
  Filled 2014-08-21 (×2): qty 1

## 2014-08-21 MED ORDER — DOXYCYCLINE HYCLATE 100 MG PO TABS: 100.0000 mg | ORAL_TABLET | Freq: Two times a day (BID) | ORAL | Status: DC

## 2014-08-21 NOTE — Consult Note (Signed)
Reason for Consult: acute on chronic systolic and diastolic HF   Referring Physician: Dr. Wynelle Cleveland PCP: [name], MD Primary Cardiologist: Dr. Mliss Sax is an 80 y.o. female.    Chief Complaint:  Admitted 08/19/14 with SOb and cough + PNA and CHF.   HPI: 80 year old thin appearing widowed [place] American female mother of 7 living children is now followed by Dr. Gwenlyn Found. She has been seen by Dr. Aldona Bar in the past. She has a history of nonischemic myopathy documented by cardiac catheterization performed at Dr. Debara Pickett 05/24/10. She had minimal CAD with an ejection fraction of 20%- more recent echo with EF of 45%.  She had NSTEMI in April.   Nuc study in &/2015 was low risk lexiscan myoview with EF 44%.   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  Linus Mako with the aid of a walker.  She was previously on anticoagulation but this was discontinued due to GI bleed. 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  She was admitted on the 7th with 3-4 days of dyspnea and edema.  + productive cough yellow in color.  She complained of not being able to sleep when lying flat.  She was admitted and treated with cipro, and vanc.  She was given IV lasix.  Her I&O since admit -1175.   Her wt is still elevated at 161.4 -Dry wt in July was 154 lbs.  ECHO this admit with decline in EF: Left ventricle: The cavity size was mildly dilated. There was mild concentric hypertrophy. Systolic function was moderately to severely reduced. The estimated ejection fraction was in the range of 30% to 35%. Diffuse hypokinesis. Features are consistent with a pseudonormal  left ventricular filling pattern, with concomitant abnormal relaxation and increased filling pressure (grade 2 diastolic dysfunction). Doppler parameters are consistent with elevated ventricular end-diastolic filling pressure. - Ventricular septum: Septal motion showed paradox. - Aortic valve: There was mild regurgitation. - Mitral valve: There was severe regurgitation with systolic reversal of flow in the pulmonary veins. - Left atrium: The atrium was mildly dilated. - Right ventricle: The cavity size was mildly dilated. Wall thickness was normal. Systolic function was moderately reduced. - Right atrium: The atrium was mildly dilated. - Atrial septum: A patent foramen ovale cannot be excluded. - Tricuspid valve: There was moderate regurgitation. - Pulmonary arteries: Systolic pressure was moderately increased. PA peak pressure: 51 mm Hg (S). - Pericardium, extracardiac: A trivial pericardial effusion was identified posterior to the heart. Diastolic dysfunction remains the same.    EKG: Normal sinus rhythm Possible Left atrial enlargement Right superior axis deviation Non-specific intra-ventricular conduction block Abnormal ECG No significant change since last tracing  Past Medical History  Diagnosis Date  . Asthma   . CAD (coronary artery disease)     Pt reports MI in 2006 (no documentation).  Cardiolite in 05/2002 and 07/2006 did not reveal any reversible ischemia.  Pt follows with Dr. Rex Kras at [facility].  . CHF (congestive heart failure)     EF 25-30% with dilated LV, mild LVH, severe hypokinesis, and mod-severe reduction in RV function  . Osteoporosis   . HYPERTENSION 08/03/2006  .  GASTROPARESIS, DIABETIC 08/03/2006  . HYPERLIPIDEMIA 08/03/2006  . OBSTRUCTIVE SLEEP APNEA 01/06/2008  . PERIPHERAL NEUROPATHY 08/03/2006  . GERD 08/03/2006  . LOW BACK PAIN, CHRONIC 08/03/2006  . OSTEOPOROSIS 03/21/2009  . CEREBRAL EMBOLISM, WITH INFARCTION 07/02/2010  . Angina     . Myocardial infarction "2 or 3"  . Pneumonia 02/26/12    "a few times; probably even today"  . Shortness of breath     "all the time"  . DIABETES MELLITUS, TYPE II 11/04/1983  . Blood transfusion 08/2011  . Lower GI bleeding 08/2011 and 01/2014  . Chronic daily headache   . Migraines   . Stroke summer 2011    "made my left hip worse"  . Uterine cancer   . Nonischemic cardiomyopathy 05/2010    Left heart catheterization:2011. Nonobstructive coronary artery disease.  . Pulmonary hypertension   . Hypertension   . NSTEMI (non-ST elevated myocardial infarction) 01/2014    type II with CVA  . Acute embolic stroke 3/81/8299  . Non-ST elevation MI (NSTEMI) 02/04/2014  . E. coli UTI 03/02/2014  . Renal failure, acute 02/04/2014  . Iron deficiency anemia secondary to blood loss (chronic) 01/06/2008    Qualifier: Diagnosis of  By: Tomasa Hosteller MD, Veronique D.   . Nonsustained ventricular tachycardia   . Angiodysplasia of intestine with hemorrhage 05/13/2014  . Pacemaker     Past Surgical History  Procedure Laterality Date  . Esophagogastroduodenoscopy  08/26/2011    Procedure: ESOPHAGOGASTRODUODENOSCOPY (EGD);  Surgeon: [name], MD;  Location: [facility] ENDOSCOPY;  Service: Endoscopy;  Laterality: N/A;  . Colonoscopy  08/28/2011    Procedure: COLONOSCOPY;  Surgeon: [name], MD;  Location: [place];  Service: Endoscopy;  Laterality: N/A;  . Vaginal hysterectomy    . Tubal ligation    . Cataract extraction w/ intraocular lens  implant, bilateral    . Toe surgery      "right big toe; operated on it to straighten it out; it was under"  . Polysomnogram  10/17/2005    AHI-7.28/hr. AHI REM-20.8/hr. Average oxygen saturation range during REM and NREM was 97%. Lowest oxygen saturation during REM sleep was 90%.  . Carotid duplex  05/28/2010    No significant extracranial carotid artery stenosis demonstrated. Vertebrals are patent w/ antegrade flow.  . Cardiac catheterization  05/24/2010    No  intervention - recommed medical therapy.  . Cardiovascular stress test  08/07/2006    Moderate-severe defect seen in Basal inferior, Mid inferoseptal, Mid inferior, Mid inferolateral, and Apical inferior regions - consistent w/ infarct/scar. No scintigraphic evidence of inducible myocardial ischemia.  . Transthoracic echocardiogram  08/29/2011    EF 55-60%, moderate LVH,   . Esophagogastroduodenoscopy N/A 02/08/2014    Procedure: ESOPHAGOGASTRODUODENOSCOPY (EGD);  Surgeon: [name], MD;  Location: [facility] ENDOSCOPY;  Service: Endoscopy;  Laterality: N/A;  . Colonoscopy N/A 05/12/2014    Procedure: COLONOSCOPY;  Surgeon: [name], MD;  Location: [place];  Service: Endoscopy;  Laterality: N/A;  . Enteroscopy N/A 05/13/2014    Procedure: ENTEROSCOPY;  Surgeon: [name], MD;  Location: [place];  Service: Endoscopy;  Laterality: N/A;  . Enteroscopy N/A 07/13/2014    Procedure: small bowel enteroscopy;  Surgeon: Jerene Bears, MD;  Location: [facility] ENDOSCOPY;  Service: Endoscopy;  Laterality: N/A;  requests slim colonoscope    Family History  Problem Relation Age of Onset  . Diabetes insipidus Mother   . Hypertension Mother   . Hypertension Father   . Hypertension  Sister   . Hypertension Child   . Stomach cancer Brother    Social History:  reports that she has never smoked. She quit smokeless tobacco use about 7 years ago. Her smokeless tobacco use included Snuff. She reports that she does not drink alcohol or use illicit drugs.  Allergies:  Allergies  Allergen Reactions  . Baking Soda-Fluoride [Sodium Fluoride] Nausea And Vomiting  . Magnesium Hydroxide Nausea And Vomiting    Medications Prior to Admission  Medication Sig Dispense Refill  . acetaminophen (TYLENOL) 500 MG tablet Take 500 mg by mouth every 6 (six) hours as needed for moderate pain or headache.    . albuterol (PROAIR HFA) 108 (90 BASE) MCG/ACT inhaler Inhale 2 puffs into the lungs every 6 (six) hours as  needed for wheezing. 8.5 g 11  . amLODipine (NORVASC) 5 MG tablet Take 1 tablet (5 mg total) by mouth daily. 30 tablet 1  . atorvastatin (LIPITOR) 40 MG tablet Take 1 tablet (40 mg total) by mouth daily at 6 PM. 30 tablet 11  . ferrous sulfate 325 (65 FE) MG tablet Take 1 tablet (325 mg total) by mouth 2 (two) times daily with a meal. 60 tablet 5  . insulin glargine (LANTUS) 100 UNIT/ML injection 5 units in the a.m. and 8 units each bedtime (Patient taking differently: Inject 5-8 Units into the skin 2 (two) times daily. 5 units in the a.m. and 8 units each bedtime) 10 mL 11  . isosorbide mononitrate (IMDUR) 60 MG 24 hr tablet Take 1 tablet (60 mg total) by mouth daily. 30 tablet 2  . loratadine (ALLERGY RELIEF) 10 MG tablet Take 10 mg by mouth daily.    . magnesium oxide (MAG-OX) 400 (241.3 MG) MG tablet Take 1 tablet (400 mg total) by mouth 2 (two) times daily. 60 tablet 1  . metoprolol succinate (TOPROL-XL) 25 MG 24 hr tablet Take 1 tablet (25 mg total) by mouth daily. 30 tablet 4  . Multiple Vitamins-Minerals (ALIVE WOMENS 50+ PO) Take 1 tablet by mouth daily.    Marland Kitchen omeprazole (PRILOSEC) 20 MG capsule Take 1 capsule (20 mg total) by mouth daily before breakfast. 30 capsule 11  . polyethylene glycol (MIRALAX / GLYCOLAX) packet Take 17 g by mouth daily.      Results for orders placed or performed during the hospital encounter of 08/19/14 (from the past 48 hour(s))  Glucose, capillary     Status: Abnormal   Collection Time: 08/19/14  3:59 PM  Result Value Ref Range   Glucose-Capillary 145 (H) 70 - 99 mg/dL   Comment 1 Notify RN   Glucose, capillary     Status: Abnormal   Collection Time: 08/19/14  9:23 PM  Result Value Ref Range   Glucose-Capillary 68 (L) 70 - 99 mg/dL  Glucose, capillary     Status: None   Collection Time: 08/19/14 10:11 PM  Result Value Ref Range   Glucose-Capillary 81 70 - 99 mg/dL  Glucose, capillary     Status: None   Collection Time: 08/20/14  1:11 AM  Result Value  Ref Range   Glucose-Capillary 94 70 - 99 mg/dL  Culture, expectorated sputum-assessment     Status: None   Collection Time: 08/20/14  1:17 AM  Result Value Ref Range   Specimen Description SPUTUM    Special Requests Normal    Sputum evaluation      THIS SPECIMEN IS ACCEPTABLE. RESPIRATORY CULTURE REPORT TO FOLLOW.   Report Status 08/20/2014 FINAL   Culture,  respiratory (NON-Expectorated)     Status: None (Preliminary result)   Collection Time: 08/20/14  1:17 AM  Result Value Ref Range   Specimen Description SPUTUM    Special Requests ADDED 0225    Gram Stain      MODERATE WBC PRESENT, PREDOMINANTLY PMN FEW SQUAMOUS EPITHELIAL CELLS PRESENT MODERATE GRAM POSITIVE COCCI IN PAIRS IN CLUSTERS IN CHAINS MODERATE GRAM NEGATIVE RODS Performed at [facility]    Culture      NORMAL OROPHARYNGEAL FLORA Performed at [facility]    Report Status PENDING   Basic metabolic panel     Status: Abnormal   Collection Time: 08/20/14  4:18 AM  Result Value Ref Range   Sodium 141 137 - 147 mEq/L   Potassium 4.4 3.7 - 5.3 mEq/L   Chloride 106 96 - 112 mEq/L   CO2 23 19 - 32 mEq/L   Glucose, Bld 67 (L) 70 - 99 mg/dL   BUN 26 (H) 6 - 23 mg/dL   Creatinine, Ser 1.77 (H) 0.50 - 1.10 mg/dL   Calcium 8.7 8.4 - 10.5 mg/dL   GFR calc non Af Amer 26 (L) >90 mL/min   GFR calc Af Amer 30 (L) >90 mL/min    Comment: (NOTE) The eGFR has been calculated using the CKD EPI equation. This calculation has not been validated in all clinical situations. eGFR's persistently <90 mL/min signify possible Chronic Kidney Disease.    Anion gap 12 5 - 15  Glucose, capillary     Status: Abnormal   Collection Time: 08/20/14  6:08 AM  Result Value Ref Range   Glucose-Capillary 52 (L) 70 - 99 mg/dL  Glucose, capillary     Status: None   Collection Time: 08/20/14  6:51 AM  Result Value Ref Range   Glucose-Capillary 91 70 - 99 mg/dL  Glucose, capillary     Status: Abnormal   Collection Time: 08/20/14  11:05 AM  Result Value Ref Range   Glucose-Capillary 178 (H) 70 - 99 mg/dL   Comment 1 Notify RN   Glucose, capillary     Status: Abnormal   Collection Time: 08/20/14  5:24 PM  Result Value Ref Range   Glucose-Capillary 205 (H) 70 - 99 mg/dL   Comment 1 Notify RN   Glucose, capillary     Status: Abnormal   Collection Time: 08/20/14  8:39 PM  Result Value Ref Range   Glucose-Capillary 181 (H) 70 - 99 mg/dL   Comment 1 Notify RN    Comment 2 Documented in Chart   Basic metabolic panel     Status: Abnormal   Collection Time: 08/21/14  3:39 AM  Result Value Ref Range   Sodium 134 (L) 137 - 147 mEq/L    Comment: DELTA CHECK NOTED   Potassium 5.0 3.7 - 5.3 mEq/L   Chloride 100 96 - 112 mEq/L   CO2 21 19 - 32 mEq/L   Glucose, Bld 221 (H) 70 - 99 mg/dL   BUN 30 (H) 6 - 23 mg/dL   Creatinine, Ser 1.96 (H) 0.50 - 1.10 mg/dL   Calcium 8.6 8.4 - 10.5 mg/dL   GFR calc non Af Amer 23 (L) >90 mL/min   GFR calc Af Amer 27 (L) >90 mL/min    Comment: (NOTE) The eGFR has been calculated using the CKD EPI equation. This calculation has not been validated in all clinical situations. eGFR's persistently <90 mL/min signify possible Chronic Kidney Disease.    Anion gap 13 5 -  15  Glucose, capillary     Status: Abnormal   Collection Time: 08/21/14  5:44 AM  Result Value Ref Range   Glucose-Capillary 199 (H) 70 - 99 mg/dL  Glucose, capillary     Status: Abnormal   Collection Time: 08/21/14 11:04 AM  Result Value Ref Range   Glucose-Capillary 129 (H) 70 - 99 mg/dL   Comment 1 Notify RN    No results found.  ROS: General:no colds or fevers, no weight changes Skin:no rashes or ulcers HEENT:no blurred vision, no congestion CV:see HPI PUL:see HPI GI:no diarrhea constipation or melena, no indigestion GU:no hematuria, no dysuria MS:no joint pain, no claudication, + lower ext edema before admit Neuro:no syncope, no lightheadedness Endo:+ diabetes, no thyroid disease   Blood pressure 144/60,  pulse 71, temperature 97.6 F (36.4 C), temperature source Oral, resp. rate 16, height 5' 2" (1.575 m), weight 161 lb 4.8 oz (73.165 kg), last menstrual period 12/19/1968, SpO2 100 %. PE: General:Pleasant affect, NAD Skin:Warm and dry, brisk capillary refill HEENT:normocephalic, sclera clear, mucus membranes moist Neck:supple, + JVD, no bruits  Heart:S1S2 RRR with 2/6 systolic murmur, no gallup, rub or click Lungs:diminished lung sounds,  With few carackles, + rhonchi, no wheezes PYK:DXIP, non tender, + BS, do not palpate liver spleen or masses Ext:no to tr. lower ext edema, 2+ pedal pulses, 2+ radial pulses Neuro:alert and oriented X 3, MAE, follows commands, + facial symmetry    Assessment/Plan Principal Problem:   Acute respiratory failure- improved Active Problems:   NSVT- 17 beats (08/20/14)  not on tele now- will add tele back, K+ 5.0, Mg+ 2.0   DM (diabetes mellitus), type 2, uncontrolled- IM following    Obstructive sleep apnea   Essential hypertension stable   CVA (cerebral infarction)   Atrial fibrillation- maintaining SR   Angiodysplasia of intestine with hemorrhage   HCAP (healthcare-associated pneumonia)   Acute on chronic combined systolic and diastolic heart failure- now on lasix 40 mg po daily, had not been on lasix as outpt.  improved  Hx of non obstructive CAD with cath 20011and normal nuc in July 2015.  Negative troponin on this admit.     CKD-4  With Cr. Today of 1.96 ( normal cr 1.6-1.7 though in July + acute renal failure at 2.98)    SEVERE MR ? Need of life vest.  Cecilie Kicks R  Nurse Practitioner Certified [place] Pager [phone] or after 5pm or weekends call [phone] 08/21/2014, 2:16 PM    Patient seen and examined. Agree with assessment and plan. Pleasant 80 yo AAF with a h/o NICM with EF 20% in 2011 with improvement with medical therapy. She has a h/o DM,  Prior CVA, OSA, and has had GI bleeding issues in past. She has a h/o  PAF. She is admitted with pneumonia currently on vanc/cipro. Her most recent echo is worrisome with an EF of 30 -35% and evidence for severe MR with pulmonary vein flow reversal which suggest even worse forward cardiac output. She has had NSVT documented on monitor. She will ultimate need consideration for R and L heart cath once pulmonary status and if renal fxn stabilized. Will add hydralazine to nitrate therapy and begin with 12.5 mg tid, increase to 25 tid and then 37.5 mg tid as BP allows. F/U renal fxn, BNP in am.    Troy Sine, MD, [facility] 08/21/2014 3:30 PM

## 2014-08-21 NOTE — Clinical Documentation Improvement (Signed)
Risk Factors: Worsening renal function, pre-existing renal disease or dysfunction per 11/08 progress notes.  Labs:      Bun / Creat / GFR: 11/09: 30 / 1.96 / 27. 11/07: 27 / 1.56 /35.   Possible Diagnosis? --Acute Renal Failure --Acute on Chronic Renal Failure  . Document the stage of CKD --Chronic kidney disease, stage 1- GFR > OR = 90 --Chronic kidney disease, stage 2 (mild) - GFR 60-89 --Chronic kidney disease, stage 3 (moderate) - GFR 30-59 --Chronic kidney disease, stage 4 (severe) - GFR 15-29 --Chronic kidney disease, stage 5- GFR < 15   --End-stage renal disease (ESRD)  . Document any underlying cause of CKD such as Diabetes or Hypertension . Document if the patient is dependent on Dialysis . Document any associated diagnoses/conditions    Thank Sherian Maroon Documentation Specialist [phone] [email or web address]

## 2014-08-22 DIAGNOSIS — I340 Nonrheumatic mitral (valve) insufficiency: Secondary | ICD-10-CM

## 2014-08-22 DIAGNOSIS — I5023 Acute on chronic systolic (congestive) heart failure: Secondary | ICD-10-CM

## 2014-08-22 DIAGNOSIS — I429 Cardiomyopathy, unspecified: Secondary | ICD-10-CM

## 2014-08-22 DIAGNOSIS — J209 Acute bronchitis, unspecified: Secondary | ICD-10-CM

## 2014-08-22 DIAGNOSIS — I428 Other cardiomyopathies: Secondary | ICD-10-CM

## 2014-08-22 LAB — BASIC METABOLIC PANEL
ANION GAP: 12 (ref 5–15)
BUN: 31 mg/dL — ABNORMAL HIGH (ref 6–23)
CHLORIDE: 103 meq/L (ref 96–112)
CO2: 23 mEq/L (ref 19–32)
Calcium: 8.7 mg/dL (ref 8.4–10.5)
Creatinine, Ser: 1.8 mg/dL — ABNORMAL HIGH (ref 0.50–1.10)
GFR, EST AFRICAN AMERICAN: 30 mL/min — AB (ref >90)
GFR, EST NON AFRICAN AMERICAN: 26 mL/min — AB (ref >90)
Glucose, Bld: 138 mg/dL — ABNORMAL HIGH (ref 70–99)
Potassium: 4.3 mEq/L (ref 3.7–5.3)
SODIUM: 138 meq/L (ref 137–147)

## 2014-08-22 LAB — LEGIONELLA ANTIGEN, URINE

## 2014-08-22 LAB — CULTURE, RESPIRATORY

## 2014-08-22 LAB — PRO B NATRIURETIC PEPTIDE: Pro B Natriuretic peptide (BNP): 13748 pg/mL — ABNORMAL HIGH (ref 0–450)

## 2014-08-22 LAB — CULTURE, RESPIRATORY W GRAM STAIN: Culture: NORMAL

## 2014-08-22 LAB — GLUCOSE, CAPILLARY
Glucose-Capillary: 138 mg/dL — ABNORMAL HIGH (ref 70–99)
Glucose-Capillary: 177 mg/dL — ABNORMAL HIGH (ref 70–99)
Glucose-Capillary: 276 mg/dL — ABNORMAL HIGH (ref 70–99)

## 2014-08-22 MED ORDER — CIPROFLOXACIN HCL 500 MG PO TABS: 500.0000 mg | ORAL_TABLET | Freq: Every day | ORAL | Status: DC

## 2014-08-22 MED ORDER — HYDRALAZINE HCL 25 MG PO TABS: 12.5000 mg | ORAL_TABLET | Freq: Three times a day (TID) | ORAL | Status: DC

## 2014-08-22 NOTE — Progress Notes (Signed)
     SUBJECTIVE: Pt is feeling much better. Breathing improved. No chest pain.   BP 143/85 mmHg  Pulse 66  Temp(Src) 98 F (36.7 C) (Oral)  Resp 16  Ht 5\' 2"  (1.575 m)  Wt 157 lb (71.215 kg)  BMI 28.71 kg/m2  SpO2 100%  LMP 12/19/1968  Intake/Output Summary (Last 24 hours) at 08/22/14 1228 Last data filed at 08/22/14 0900  Gross per 24 hour  Intake    840 ml  Output   1450 ml  Net   -610 ml    PHYSICAL EXAM General: Well developed, well nourished, in no acute distress. Alert and oriented x 3.  Psych:  Good affect, responds appropriately Neck: No JVD. No masses noted.  Lungs: Clear bilaterally with no wheezes or rhonci noted.  Heart: RRR with no murmurs noted. Abdomen: Bowel sounds are present. Soft, non-tender.  Extremities: No lower extremity edema.   LABS: Basic Metabolic Panel:  Recent Labs  08/21/14 0339 08/22/14 0740  NA 134* 138  K 5.0 4.3  CL 100 103  CO2 21 23  GLUCOSE 221* 138*  BUN 30* 31*  CREATININE 1.96* 1.80*  CALCIUM 8.6 8.7   Current Meds: . amLODipine  5 mg Oral Daily  . antiseptic oral rinse  7 mL Mouth Rinse BID  . aspirin  81 mg Oral Daily  . atorvastatin  40 mg Oral q1800  . benzonatate  100 mg Oral TID  . ciprofloxacin  500 mg Oral Daily  . ferrous sulfate  325 mg Oral BID WC  . hydrALAZINE  12.5 mg Oral 3 times per day  . insulin aspart  0-15 Units Subcutaneous TID WC  . insulin aspart  0-5 Units Subcutaneous QHS  . insulin glargine  5 Units Subcutaneous QHS  . isosorbide mononitrate  60 mg Oral Daily  . magnesium oxide  400 mg Oral BID  . metoprolol succinate  25 mg Oral Daily  . pantoprazole  40 mg Oral Daily  . polyethylene glycol  17 g Oral Daily  . sodium chloride  3 mL Intravenous Q12H  . vancomycin  750 mg Intravenous Q24H     ASSESSMENT AND PLAN:  1. Acute on chronic systolic CHF: diuresed well with IV Lasix. Would benefit from Lasix 40 mg daily as an outpatient.   2. Mitral regurgitation: Her MR has been  moderately severe for at least the last 8 months. Symptomatically improved now with diuresis. Further f/u as an outpatient.  3. Non-ischemic cardiomyopathy: Minimal CAD by cath 2011. Continue medical management for now with beta blocker, Imdur. No ace-inh with h/o renal insufficiency.   4. CAD: mild by cath 2011. Continue statin  5. Non-sustained VT: continue beta blocker  OK to discharge home today from cardiac standpoint. She will need f/u with DR. Berry in 1-2 weeks with BMET at that time. WE will contact our [place] office to arrange the f/u and BMET.      MCALHANY,CHRISTOPHER  11/10/201512:28 PM

## 2014-08-22 NOTE — Progress Notes (Signed)
1645 discharge instructions and prescriptions given to son and daughter Verbalized understanding

## 2014-08-22 NOTE — Plan of Care (Signed)
Problem: Phase III Progression Outcomes Goal: O2 sats > or equal to 93% on room air Outcome: Adequate for Discharge Goal: Pain controlled on oral analgesia Outcome: Completed/Met Date Met:  08/22/14 Goal: Activity at appropriate level-compared to baseline (UP IN CHAIR FOR HEMODIALYSIS)  Outcome: Adequate for Discharge Goal: Tolerating diet Outcome: Adequate for Discharge Goal: Convert IV antibiotics to PO Outcome: Adequate for Discharge     

## 2014-08-22 NOTE — Plan of Care (Signed)
Problem: Phase II Progression Outcomes Goal: Encourage coughing & deep breathing Outcome: Progressing     

## 2014-08-22 NOTE — Discharge Summary (Signed)
Physician Discharge Summary  [name] FHQ:[id number] DOB: 1933-01-02 DOA: 08/19/2014  PCP: Evlyn Courier, MD  Admit date: 08/19/2014 Discharge date: 08/22/2014  Time spent: >45  minutes  Recommendations for Outpatient Follow-up:  1. B met in 1 wk  Discharge Condition: stable  Diet recommendation: low sodium, heart healthy, diabetic diet  Discharge Diagnoses:  Principal Problem:   Acute respiratory failure Active Problems:   Acute bronchitis   Acute on chronic systolic CHF (congestive heart failure), NYHA class 3   DM (diabetes mellitus), type 2, uncontrolled   Obstructive sleep apnea   Essential hypertension   CVA (cerebral infarction)   Atrial fibrillation   Angiodysplasia of intestine with hemorrhage   Acute on chronic combined systolic and diastolic heart failure   CKD (chronic kidney disease) stage 4, GFR 15-29 ml/min   Non-ischemic cardiomyopathy   Mitral regurgitation   History of present illness:  [name] is a 80 y.o. 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 She was previously on anticoagulation but this was discontinued due to GI bleed. 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 Patient is not a very good historian and son at bedside to assist history. The patient presents with 3-4 days of dyspnea and pedal edema. She also admits to a cough that's productive of yellowish colored mucus. She has not checked her temperature but has had some hot and cold spells. She is unable to lay flat when sleeping. She has been taking her medications appropriately.  She has no other complaints.   Hospital Course:  Acute respiratory failure - complaints of new onset of productive cough and increasing pedal edema and chest x-ray findings consistent with exacerbation of heart failure with superimposed pneumonia  -repeat chest x-ray on 11/9 reveals mild interstitial edema and some atelectasis-see report below (A) HCAP?  -most likely acute bronchitis as after diuresis chest x-ray does not reveal an underlying focal infiltrate - sputum culture reveals normal flora -  will d/c vancomycin and cont Ciprofloxacin on discharge today - urine strep and Legionella antigen negative -symptoms of cough and shortness of breath have improved significantly and she has been on room air with good oxygen saturations for the past 48 hours (B) Acute on chronic combined systolic and diastolic heart failure -diuresed well with IV Lasix- In negative balance by 1800 mL- has diuresed adequately and now- pulse ox is 98% on room air - last 2-D echo was in March of this year and revealed an EF of 40-45% and grade 2 diastolic dysfunction-  - repeat echo reveals that there has been a decline in EF which is now 30-35%-still has grade 2 diastolic dysfunction, severe mitral regurgitation moderate pulmonary hypertension and moderately reduced RV function -Not on Lasix at outpatient- cardiology recommends 40 mg daily on d/c today -continue beta blocker and Imdur- low dose Hydralazine added by cardiology yesterday  Active Problems:  DM (diabetes mellitus), type 2, uncontrolled -cont home doses of insulin   Obstructive sleep apnea - C Pap at bedtime   Essential hypertension -Hydralazine added to Metoprolol and Imdur    CVA (cerebral infarction) -aspirin 81 mg only due to recent GI bleed  Atrial fibrillation -Currently in sinus rhythm --aspirin 81 mg only due to recent GI bleed   Angiodysplasia of intestine with hemorrhage -Continue iron for iron deficiency anemia -     Procedures:  none  Consultations:  cardiology  Discharge Exam: Filed Weights   08/20/14 0427 08/21/14 0722 08/22/14 0709  Weight: 72.258 kg (159 lb 4.8 oz) 73.165 kg (161 lb 4.8 oz) 71.215 kg (157 lb)   Filed Vitals:   08/22/14 1028  BP:   Pulse: 66  Temp:   Resp:     General: AAO x 3, no distress Cardiovascular: RRR, no murmurs  Respiratory: clear to auscultation bilaterally GI: soft, non-tender, non-distended, bowel sound positive  Discharge Instructions You were cared for by a hospitalist during your hospital stay. If you have any questions about your discharge medications or the care you received while you were in the hospital after you are discharged, you can call the unit and asked to speak with the hospitalist on call if the hospitalist that took care of you is not available. Once you are discharged, your primary care physician will handle any further medical issues. Please note that NO REFILLS for any discharge medications will be authorized once you are discharged, as it is imperative that you return to your primary care physician (or establish a relationship with a primary care physician if you do not have one) for your aftercare needs so that they can reassess your need for medications and monitor your lab values.      Discharge Instructions    Diet - low sodium heart healthy    Complete by:  As directed   Diabetic diet     Increase activity slowly    Complete by:  As directed             Medication List    TAKE these medications        acetaminophen 500 MG tablet  Commonly known as:  TYLENOL  Take 500 mg by mouth every 6 (six) hours as needed for moderate pain or headache.     albuterol 108 (90 BASE) MCG/ACT inhaler  Commonly known as:  PROAIR HFA  Inhale 2 puffs into the lungs every 6 (six) hours as needed for wheezing.     ALIVE WOMENS 50+ PO  Take 1 tablet by mouth daily.     ALLERGY RELIEF 10 MG tablet  Generic drug:  loratadine  Take 10  mg by mouth daily.     amLODipine 5 MG tablet  Commonly known as:  NORVASC  Take 1 tablet (5 mg total) by mouth daily.     atorvastatin 40 MG tablet  Commonly known as:  LIPITOR  Take 1 tablet (40 mg total) by mouth daily at 6 PM.     ciprofloxacin 500 MG tablet  Commonly known as:  CIPRO  Take 1 tablet (500 mg total) by mouth daily.     ferrous sulfate 325 (65 FE) MG tablet  Take 1 tablet (325 mg total) by mouth 2 (two) times daily with a meal.     hydrALAZINE 25 MG tablet  Commonly known as:  APRESOLINE  Take 0.5 tablets (12.5 mg total) by mouth every 8 (eight) hours.     insulin glargine 100 UNIT/ML injection  Commonly known as:  LANTUS  5 units in the a.m. and 8 units each bedtime     isosorbide mononitrate 60 MG 24 hr tablet  Commonly known as:  IMDUR  Take 1 tablet (60 mg total)  by mouth daily.     magnesium oxide 400 (241.3 MG) MG tablet  Commonly known as:  MAG-OX  Take 1 tablet (400 mg total) by mouth 2 (two) times daily.     metoprolol succinate 25 MG 24 hr tablet  Commonly known as:  TOPROL-XL  Take 1 tablet (25 mg total) by mouth daily.     omeprazole 20 MG capsule  Commonly known as:  PRILOSEC  Take 1 capsule (20 mg total) by mouth daily before breakfast.     polyethylene glycol packet  Commonly known as:  MIRALAX / GLYCOLAX  Take 17 g by mouth daily.       Allergies  Allergen Reactions  . Baking Soda-Fluoride [Sodium Fluoride] Nausea And Vomiting  . Magnesium Hydroxide Nausea And Vomiting   Follow-up Information    Follow up with Lorretta Harp, MD On 09/01/2014.   Specialty:  Cardiology   Why:  1:30 pm    Contact information:   [address] [place] [address] [phone]        The results of significant diagnostics from this hospitalization (including imaging, microbiology, ancillary and laboratory) are listed below for reference.    Significant Diagnostic Studies: Dg Chest 2 View  08/21/2014   CLINICAL DATA:   Clinical pneumonia and CHF  EXAM: CHEST  2 VIEW  COMPARISON:  Portable chest x-ray of August 19, 2014  FINDINGS: The cardiac silhouette remains enlarged. The pulmonary vascularity is mildly engorged. The pulmonary interstitial markings are increased. The retrocardiac region is slightly less dense today. No significant pleural effusion is demonstrated. There is persistent thickening of the minor fissure. The observed bony thorax is unremarkable.  IMPRESSION: There are persistent findings of CHF with mild interstitial edema. The retrocardiac region on the left is better demonstrated today likely reflecting resolving atelectasis.   Electronically Signed   By: David  Martinique   On: 08/21/2014 16:39   Dg Chest Port 1 View  08/19/2014   CLINICAL DATA:  Recent productive cough and shortness of breath. Lower extremity swelling. History of diabetes and CHF. Initial encounter.  EXAM: PORTABLE CHEST - 1 VIEW  COMPARISON:  05/10/2014; 05/10/2014; 02/04/2014  FINDINGS: Grossly unchanged enlarged cardiac silhouette. Atherosclerotic plaque within the thoracic aorta. Chronic pulmonary venous congestion without frank evidence of edema. Worsening bibasilar heterogeneous opacities, left greater than right. Trace bilateral effusions are not excluded. No pneumothorax. Unchanged bones.  IMPRESSION: 1. Worsening bibasilar opacities, left greater than right, atelectasis versus infiltrate. 2. Similar findings of cardiomegaly and pulmonary venous congestion without frank evidence of edema.   Electronically Signed   By: Sandi Mariscal M.D.   On: 08/19/2014 09:22    Microbiology: Recent Results (from the past 240 hour(s))  Blood culture (routine x 2)     Status: None (Preliminary result)   Collection Time: 08/19/14  8:40 AM  Result Value Ref Range Status   Specimen Description BLOOD RIGHT ARM  Final   Special Requests BOTTLES DRAWN AEROBIC AND ANAEROBIC 10CXC  Final   Culture  Setup Time   Final    08/19/2014 14:51 Performed at  [facility]    Culture   Final           BLOOD CULTURE RECEIVED NO GROWTH TO DATE CULTURE WILL BE HELD FOR 5 DAYS BEFORE ISSUING A FINAL NEGATIVE REPORT Note: Culture results may be compromised due to an excessive volume of blood received in culture bottles. Performed at [facility] PENDING  Incomplete  Blood culture (routine x 2)     Status: None (Preliminary result)   Collection Time: 08/19/14  8:45 AM  Result Value Ref Range Status   Specimen Description BLOOD RIGHT HAND  Final   Special Requests BOTTLES DRAWN AEROBIC ONLY 8CC  Final   Culture  Setup Time   Final    08/19/2014 14:51 Performed at [facility]    Culture   Final           BLOOD CULTURE RECEIVED NO GROWTH TO DATE CULTURE WILL BE HELD FOR 5 DAYS BEFORE ISSUING A FINAL NEGATIVE REPORT Performed at [facility]    Report Status PENDING  Incomplete  Culture, expectorated sputum-assessment     Status: None   Collection Time: 08/20/14  1:17 AM  Result Value Ref Range Status   Specimen Description SPUTUM  Final   Special Requests Normal  Final   Sputum evaluation   Final    THIS SPECIMEN IS ACCEPTABLE. RESPIRATORY CULTURE REPORT TO FOLLOW.   Report Status 08/20/2014 FINAL  Final  Culture, respiratory (NON-Expectorated)     Status: None   Collection Time: 08/20/14  1:17 AM  Result Value Ref Range Status   Specimen Description SPUTUM  Final   Special Requests ADDED 0225  Final   Gram Stain   Final    MODERATE WBC PRESENT, PREDOMINANTLY PMN FEW SQUAMOUS EPITHELIAL CELLS PRESENT MODERATE GRAM POSITIVE COCCI IN PAIRS IN CLUSTERS IN CHAINS MODERATE GRAM NEGATIVE RODS Performed at [facility]    Culture   Final    NORMAL OROPHARYNGEAL FLORA Performed at [facility]    Report Status 08/22/2014 FINAL  Final     Labs: Basic Metabolic Panel:  Recent Labs Lab 08/19/14 0838 08/19/14 1200 08/20/14 0418 08/21/14 0339 08/22/14 0740  NA 140  --   141 134* 138  K 4.5  --  4.4 5.0 4.3  CL 106  --  106 100 103  CO2 20  --  $R'23 21 23  'Sz$ GLUCOSE 172*  --  67* 221* 138*  BUN 27*  --  26* 30* 31*  CREATININE 1.56*  --  1.77* 1.96* 1.80*  CALCIUM 8.8  --  8.7 8.6 8.7  MG  --  2.0  --   --   --    Liver Function Tests: No results for input(s): AST, ALT, ALKPHOS, BILITOT, PROT, ALBUMIN in the last 168 hours. No results for input(s): LIPASE, AMYLASE in the last 168 hours. No results for input(s): AMMONIA in the last 168 hours. CBC:  Recent Labs Lab 08/17/14 1047 08/19/14 0838  WBC 6.0 6.8  NEUTROABS 4.9 5.4  HGB 10.7* 10.8*  HCT 33.1* 33.6*  MCV 88.4 88.4  PLT 223.0 246   Cardiac Enzymes: No results for input(s): CKTOTAL, CKMB, CKMBINDEX, TROPONINI in the last 168 hours. BNP: BNP (last 3 results)  Recent Labs  07/11/14 1855 08/19/14 0838 08/22/14 0740  PROBNP 6841.0* 17664.0* 13748.0*   CBG:  Recent Labs Lab 08/21/14 1104 08/21/14 1637 08/21/14 2151 08/22/14 0652 08/22/14 1134  GLUCAP 129* 190* 174* 138* 177*       SignedDebbe Odea, MD [facility] 08/22/2014, 2:20 PM

## 2014-08-25 LAB — CULTURE, BLOOD (ROUTINE X 2)
CULTURE: NO GROWTH
Culture: NO GROWTH

## 2014-09-01 ENCOUNTER — Other Ambulatory Visit: Payer: Self-pay | Admitting: Cardiovascular Disease

## 2014-09-01 ENCOUNTER — Telehealth: Payer: Self-pay | Admitting: Cardiovascular Disease

## 2014-09-01 ENCOUNTER — Ambulatory Visit (INDEPENDENT_AMBULATORY_CARE_PROVIDER_SITE_OTHER): Payer: Medicare Other | Admitting: Cardiovascular Disease

## 2014-09-01 ENCOUNTER — Encounter: Payer: Self-pay | Admitting: Cardiovascular Disease

## 2014-09-01 VITALS — BP 126/62 | HR 64 | Ht 62.5 in | Wt 164.9 lb

## 2014-09-01 DIAGNOSIS — I340 Nonrheumatic mitral (valve) insufficiency: Secondary | ICD-10-CM

## 2014-09-01 DIAGNOSIS — I6340 Cerebral infarction due to embolism of unspecified cerebral artery: Secondary | ICD-10-CM

## 2014-09-01 DIAGNOSIS — I5032 Chronic diastolic (congestive) heart failure: Secondary | ICD-10-CM

## 2014-09-01 DIAGNOSIS — I10 Essential (primary) hypertension: Secondary | ICD-10-CM

## 2014-09-01 NOTE — Assessment & Plan Note (Signed)
History of hypertension with blood pressure measured today of 126/82. She is on amlodipine, hydralazine and metoprolol. We'll continue her current medications at current dosing

## 2014-09-01 NOTE — Assessment & Plan Note (Signed)
The patient has a history of nonischemic cardio myopathy with chronic catheterization performed in 2011 by Dr. Debara Pickett revealing essentially normal coronary arteries. She was recently admitted with shortness of breath earlier this month and was seen in consultation by Dr. Claiborne Billings. She was treated for pneumonia. 2-D echo revealed an ejection fraction in the 30% range with mitral regurgitation. She was diuresed as well. She establish outpatient antibiotics and is feeling clinically improved.

## 2014-09-01 NOTE — Telephone Encounter (Signed)
SPOKE TO KATRINA  NEEED DX CODE- PATIENT HAD A HAND WRITTEN PRESCRIPTION TO HAVE LABS DRAWN  DX CODE FOR BMP (I50.37,N18.4,I10)GIVEN

## 2014-09-01 NOTE — Assessment & Plan Note (Signed)
Recent 2-D echocardiogram performed 08/20/14 revealed an ejection fraction of 30-35% with severe mitral regurgitation. She is not a candidate for any operative percutaneous procedure given her age and comorbidities. She is currently compensated on appropriate medications including diuretics. Continue current medications.

## 2014-09-01 NOTE — Patient Instructions (Signed)
Dr Gwenlyn Found recommends that you schedule a follow-up appointment in 6 months with an extender - Cecilie Kicks, NP.  Dr Gwenlyn Found wants you to follow-up in 1 year. You will receive a reminder letter in the mail two months in advance. If you don't receive a letter, please call our office to schedule the follow-up appointment.

## 2014-09-01 NOTE — Assessment & Plan Note (Signed)
History of hyperlipidemia on atorvastatin. Continue current medications

## 2014-09-01 NOTE — Telephone Encounter (Signed)
Katrina needs a diagnosis code

## 2014-09-01 NOTE — Progress Notes (Signed)
09/01/2014 [name]   1933-02-12  [id number]  Primary Physician Cheryl Font, MD Primary Cardiologist: Cheryl Harp MD [name]   HPI:  [name] is a 80 year old thin appearing widowed African American female mother of 53 living children accompanied by her son today. She has been seen by [name] in the past. She's a history of nonischemic myopathy documented by cardiac catheterization performed at [name] 05/24/10. She had minimal CAD with an ejection fraction of 20%. Her other problems include treated hypertension, hyperlipidemia and diabetes. She has had a stroke in the past and walks with the aid of a walker. She was recently recently hospitalized from 2/21 to the 23rd with congestive heart failure. Pro BNP was 7000 and she was treated with IV diuretics. She was hospitalized again in April with a stroke and a non-STEMI. Consideration was given to putting her on oral anticoagulant event monitor was obtained which did not show A. Fib with rapid PVCs and a short burst of nonsustained ventricular tachycardia. She was primarily a bed to chair existence in her home and infrequently gets out of the house.she was hospitalized earlier this month for shortness of breath thought to be pneumonia. She was treated with antibiotics. [name] saw her in consultation. 2-D echo revealed an EF of 30% with severe mitral regurgitation. She did have a nonischemic Myoview in July of this year. She was diuresed and discharged on oral antibiotics. She has an apartment to see her primary care physician, [name] on the 30th of this month. She is somewhat clinically improved.   Current Outpatient Prescriptions  Medication Sig Dispense Refill  . acetaminophen (TYLENOL) 500 MG tablet Take 500 mg by mouth every 6 (six) hours as needed for moderate pain or headache.    . albuterol (PROAIR HFA) 108 (90 BASE) MCG/ACT inhaler Inhale 2 puffs into the lungs every 6 (six) hours as needed for  wheezing. 8.5 g 11  . amLODipine (NORVASC) 5 MG tablet Take 1 tablet (5 mg total) by mouth daily. 30 tablet 1  . atorvastatin (LIPITOR) 40 MG tablet Take 1 tablet (40 mg total) by mouth daily at 6 PM. 30 tablet 11  . ciprofloxacin (CIPRO) 500 MG tablet Take 1 tablet (500 mg total) by mouth daily. 8 tablet 0  . ferrous sulfate 325 (65 FE) MG tablet Take 1 tablet (325 mg total) by mouth 2 (two) times daily with a meal. 60 tablet 5  . hydrALAZINE (APRESOLINE) 25 MG tablet Take 0.5 tablets (12.5 mg total) by mouth every 8 (eight) hours. 90 tablet 0  . insulin glargine (LANTUS) 100 UNIT/ML injection 5 units in the a.m. and 8 units each bedtime (Patient taking differently: Inject 5-8 Units into the skin 2 (two) times daily. 5 units in the a.m. and 8 units each bedtime) 10 mL 11  . isosorbide mononitrate (IMDUR) 60 MG 24 hr tablet Take 1 tablet (60 mg total) by mouth daily. 30 tablet 2  . loratadine (ALLERGY RELIEF) 10 MG tablet Take 10 mg by mouth daily.    . magnesium oxide (MAG-OX) 400 (241.3 MG) MG tablet Take 1 tablet (400 mg total) by mouth 2 (two) times daily. 60 tablet 1  . metoprolol succinate (TOPROL-XL) 25 MG 24 hr tablet Take 1 tablet (25 mg total) by mouth daily. 30 tablet 4  . Multiple Vitamins-Minerals (ALIVE WOMENS 50+ PO) Take 1 tablet by mouth daily.    Marland Kitchen omeprazole (PRILOSEC) 20 MG capsule Take 1  capsule (20 mg total) by mouth daily before breakfast. 30 capsule 11  . polyethylene glycol (MIRALAX / GLYCOLAX) packet Take 17 g by mouth daily.    Marland Kitchen torsemide (DEMADEX) 10 MG tablet Take 10 mg by mouth daily.     No current facility-administered medications for this visit.    Allergies  Allergen Reactions  . Baking Soda-Fluoride [Sodium Fluoride] Nausea And Vomiting  . Magnesium Hydroxide Nausea And Vomiting    History   Social History  . Marital Status: Widowed    Spouse Name: N/A    Number of Children: 7  . Years of Education: N/A   Occupational History  . Disabled     Social History Main Topics  . Smoking status: Never Smoker   . Smokeless tobacco: Former Systems developer    Types: Snuff    Quit date: 10/13/2006     Comment: 02/26/12 "stopped snuff 4-6 years ago"  . Alcohol Use: No     Comment: "stopped drinking alcohol ~ 1980's"  . Drug Use: No  . Sexual Activity: No   Other Topics Concern  . Not on file   Social History Narrative   ** Merged History Encounter **       Lives with daughter     Review of Systems: General: negative for chills, fever, night sweats or weight changes.  Cardiovascular: negative for chest pain, dyspnea on exertion, edema, orthopnea, palpitations, paroxysmal nocturnal dyspnea or shortness of breath Dermatological: negative for rash Respiratory: negative for cough or wheezing Urologic: negative for hematuria Abdominal: negative for nausea, vomiting, diarrhea, bright red blood per rectum, melena, or hematemesis Neurologic: negative for visual changes, syncope, or dizziness All other systems reviewed and are otherwise negative except as noted above.    Blood pressure 126/62, pulse 64, height 5' 2.5" (1.588 m), weight 164 lb 14.4 oz (74.798 kg), last menstrual period 12/19/1968.  General appearance: alert and no distress Neck: no adenopathy, no carotid bruit, no JVD, supple, symmetrical, trachea midline and thyroid not enlarged, symmetric, no tenderness/mass/nodules Lungs: clear to auscultation bilaterally Heart: regular rate and rhythm, S1, S2 normal, no murmur, click, rub or gallop Extremities: 1+ pitting edema bilaterally  EKG not performed today  ASSESSMENT AND PLAN:   Chronic diastolic CHF EF 48% The patient has a history of nonischemic cardio myopathy with chronic catheterization performed in 2011 by [name] revealing essentially normal coronary arteries. She was recently admitted with shortness of breath earlier this month and was seen in consultation by [name]. She was treated for pneumonia. 2-D echo revealed  an ejection fraction in the 30% range with mitral regurgitation. She was diuresed as well. She establish outpatient antibiotics and is feeling clinically improved.  Essential hypertension History of hypertension with blood pressure measured today of 126/82. She is on amlodipine, hydralazine and metoprolol. We'll continue her current medications at current dosing  Mitral regurgitation Recent 2-D echocardiogram performed 08/20/14 revealed an ejection fraction of 30-35% with severe mitral regurgitation. She is not a candidate for any operative percutaneous procedure given her age and comorbidities. She is currently compensated on appropriate medications including diuretics. Continue current medications.  HYPERLIPIDEMIA History of hyperlipidemia on atorvastatin. Continue current medications      Cheryl Harp MD [facility], [facility] 09/01/2014 9:21 AM

## 2014-09-02 LAB — BASIC METABOLIC PANEL
BUN: 29 mg/dL — AB (ref 6–23)
CO2: 26 meq/L (ref 19–32)
CREATININE: 1.77 mg/dL — AB (ref 0.50–1.10)
Calcium: 8.5 mg/dL (ref 8.4–10.5)
Chloride: 105 mEq/L (ref 96–112)
Glucose, Bld: 196 mg/dL — ABNORMAL HIGH (ref 70–99)
POTASSIUM: 4.3 meq/L (ref 3.5–5.3)
Sodium: 138 mEq/L (ref 135–145)

## 2014-09-05 ENCOUNTER — Encounter: Payer: Self-pay | Admitting: *Deleted

## 2014-09-13 ENCOUNTER — Other Ambulatory Visit: Payer: Self-pay | Admitting: Cardiovascular Disease

## 2014-09-14 NOTE — Telephone Encounter (Signed)
Rx was sent to pharmacy electronically. 

## 2014-10-09 ENCOUNTER — Other Ambulatory Visit: Payer: Self-pay | Admitting: Internal Medicine

## 2014-10-16 ENCOUNTER — Other Ambulatory Visit: Payer: Self-pay | Admitting: Internal Medicine

## 2014-10-16 DIAGNOSIS — D500 Iron deficiency anemia secondary to blood loss (chronic): Secondary | ICD-10-CM

## 2014-10-16 NOTE — Telephone Encounter (Signed)
How many refills Sir? 

## 2014-10-19 NOTE — Telephone Encounter (Signed)
Ok to refill Have her do a cbc re anemia also

## 2014-10-19 NOTE — Telephone Encounter (Signed)
Refill x6

## 2014-10-19 NOTE — Telephone Encounter (Signed)
Spoke to [name] her son, she was asleep.  Informed them to come for CBC and lab orders put into epic.  Refilled her iron.

## 2014-10-23 ENCOUNTER — Other Ambulatory Visit: Payer: Self-pay | Admitting: Internal Medicine

## 2014-10-23 ENCOUNTER — Ambulatory Visit (INDEPENDENT_AMBULATORY_CARE_PROVIDER_SITE_OTHER): Payer: Medicare Other | Admitting: Neurology

## 2014-10-23 ENCOUNTER — Encounter: Payer: Self-pay | Admitting: Neurology

## 2014-10-23 VITALS — BP 159/81 | HR 69 | Ht 62.0 in | Wt 148.0 lb

## 2014-10-23 DIAGNOSIS — I10 Essential (primary) hypertension: Secondary | ICD-10-CM

## 2014-10-23 DIAGNOSIS — E1159 Type 2 diabetes mellitus with other circulatory complications: Secondary | ICD-10-CM

## 2014-10-23 DIAGNOSIS — K922 Gastrointestinal hemorrhage, unspecified: Secondary | ICD-10-CM | POA: Insufficient documentation

## 2014-10-23 DIAGNOSIS — I480 Paroxysmal atrial fibrillation: Secondary | ICD-10-CM

## 2014-10-23 DIAGNOSIS — E785 Hyperlipidemia, unspecified: Secondary | ICD-10-CM

## 2014-10-23 DIAGNOSIS — I639 Cerebral infarction, unspecified: Secondary | ICD-10-CM

## 2014-10-23 DIAGNOSIS — I6340 Cerebral infarction due to embolism of unspecified cerebral artery: Secondary | ICD-10-CM

## 2014-10-23 NOTE — Patient Instructions (Addendum)
-   continue ASA 81 and lipitor for stroke prevention - follow up with cardiology for heart failure - continue home meds - Follow up with your primary care physician for stroke risk factor modification. Recommend maintain blood pressure goal <130/80, diabetes with hemoglobin A1c goal below 6.5% and lipids with LDL cholesterol goal below 70 mg/dL.  - avoid fall - check BP at home - follow up as needed.

## 2014-10-23 NOTE — Progress Notes (Signed)
STROKE NEUROLOGY FOLLOW UP NOTE  NAME: [name] DOB: September 14, 1933  REASON FOR VISIT: stroke follow up HISTORY FROM: chart and son  Today we had the pleasure of seeing [name] in follow-up at our Neurology Clinic. Pt was accompanied by son.   History Summary [name] is a 81 y.o. Female was on coumadin during her 05/30/2010 admission when she had a post cath stroke that was thought to be embolic. It is documented that Dr. Claiborne Billings felt she had risk of thrombus given low EF 25-30% on 06/06/10. She was not seen by neurology at that time. She was on coumadin up until 2 years ago when it was stopped due to GI bleeding.  She was again hospitalized from 02/04/2014 to 02/10/2014 for bilateral ACA and MCA/ACA watershed stroke, possible watershed infarct versus embolic strokes. She was found to atrial fibrillation, however due to GIB and anemia, she was not put back on [facility]. However, at that time, she was recommended to consider eliquis 2.5mg  bid once GIB resolves and once kidney function getting better. EF was 40-45% in 12/2013. A1C 11.5  She was brought to ED on 05/09/2014 due to SOB. Hgb was noted to be 4.7 and HCT of 15.8. FOBT (+). MRI was obtained while in hospital which showed 2 right frontoparietal infarcts. Carotid Doppler shows no evidence of hemodynamically significant internal carotid artery stenosis. 2D Echocardiogram with EF 40-50% with no source of embolus. Hgb A1c was 7.0. GI work up showed Jejunal AVM and s/p APC. She was continued on ASA due to GIB.   Follow up 06/06/14 (LL) - Son states she is slowly getting her strength back, he and his siblings are helping with transportation to appointments. She had some confusion and sundowning in the hospital in the setting of UTI, has since resolved per son.  Interval History During the interval time, the patient has been doing well. No recurrent stroke symptoms. No more GIB. Recent Hb 10.8. She walks with walker. When discussing about [facility],  son concerns that pt's GI bleeding could be recurrent as the GI physician fixed the bleeding superficially due to pt's cardiac condition. She was fond to have EF 30% with mitral regurgitation, following up with cardiology.  REVIEW OF SYSTEMS: Full 14 system review of systems performed and notable only for those listed below and in HPI above, all others are negative:  Constitutional: N/A  Cardiovascular: N/A  Ear/Nose/Throat: runny nose  Skin: N/A  Eyes: N/A  Respiratory: cough Gastroitestinal: N/A  Genitourinary: N/A Hematology/Lymphatic: N/A  Endocrine: N/A  Musculoskeletal: N/A  Allergy/Immunology: N/A  Neurological: walking difficulty Psychiatric: N/A  The following represents the patient's updated allergies and side effects list: Allergies  Allergen Reactions  . [name] [Sodium Fluoride] Nausea And Vomiting  . [name] Nausea And Vomiting    The neurologically relevant items on the patient's problem list were reviewed on today's visit.  Neurologic Examination  A problem focused neurological exam (12 or more points of the single system neurologic examination, vital signs counts as 1 point, cranial nerves count for 8 points) was performed.  Blood pressure 159/81, pulse 69, height 5\' 2"  (1.575 m), weight 148 lb (67.132 kg), last menstrual period 12/19/1968.  General - Well nourished, well developed, in no apparent distress.  Ophthalmologic - not able to see through.  Cardiovascular - Regular rate and rhythm with no murmur, no arrhythmia.  Mental Status -  Level of arousal and orientation to time, place, and person were intact.  Language including expression, repetition, comprehension was assessed and found intact. Naming 2/3 3/3 registration and 0/3 delayed recall. Fund of Knowledge was assessed and was impaired, only know current president.  Cranial Nerves II - XII - II - Visual field intact OU. III, IV, VI - Extraocular movements intact. V -  Facial sensation intact bilaterally. VII - Facial movement intact bilaterally. VIII - Hearing & vestibular intact bilaterally. X - Palate elevates symmetrically. XI - Chin turning & shoulder shrug intact bilaterally. XII - Tongue protrusion intact.  Motor Strength - The patient's strength was 4/5 UEs and LLE proximal 4-/5 due to left hip fracture in the past.  Bulk was normal and fasciculations were absent.   Motor Tone - Muscle tone was assessed at the neck and appendages and was normal.  Reflexes - The patient's reflexes were normal in all extremities and she had no pathological reflexes.  Sensory - Light touch, temperature/pinprick were assessed and were normal.    Coordination - The patient had normal movements in the hands and feet with no ataxia or dysmetria.  Tremor was absent.  Gait and Station - walk with walker, slow.  Data reviewed: I personally reviewed the images and agree with the radiology interpretations.  CT of the brain 05/10/2014 1. Diffuse lucency noted in the lower right temporal lobe most likely beam hardening artifact. A right temporal lobe infarct cannot be completely excluded. 2. Multiple periventricular punctate lucencies consistent with previously identified multiple ischemic foci . Similar findings noted on prior MRI brain 02/07/2014.   MRI of the brain 05/10/2014 Two areas of acute infarct in the right parietal lobe. Moderate chronic microvascular ischemic change with diffuse atrophy.    Carotid Doppler 01/03/2014 No evidence of hemodynamically significant internal carotid artery stenosis. Vertebral artery flow is antegrade. No change since prior study 09/17/06  2D Echo 12/28/13 EF 40-450% with no source of embolus. Moderate MR regurg.  2D echo 08/20/14 - EF 30-35% with MR severe regurg   CXR 05/10/2014 Mild left base opacification which may represent atelectasis or infection. Moderate stable cardiomegaly.   EKG normal sinus rhythm. For  complete results please see formal report.    Assessment: As you may recall, she is a 81 y.o. African American female with PMH of afib not on AC, anemia and GIB, DM, HLD and HTN was admitted in April and July last year for embolic strokes. She was on coumadin in the past with low EF and post cath stroke but was taken off due to GIB. She was since on ASA in spite of embolic strokes due to GIB which was found to be jujunal AVM s/p APC. Currently her H&H improved slowly. Discussed with son about [facility], he still concerns for further GIB with anticoagulation. Will continue ASA for now.  Plan:  - continue ASA and lipitor for stroke prevention - continue to follow up with cardiologist regarding low EF - check BP and glucose at home and continue home meds. - Follow up with your primary care physician for stroke risk factor modification. Recommend maintain blood pressure goal <130/80, diabetes with hemoglobin A1c goal below 6.5% and lipids with LDL cholesterol goal below 70 mg/dL.  - avoid fall - RTC PRN  No orders of the defined types were placed in this encounter.    Meds ordered this encounter  Medications  . KLOR-CON 10 10 MEQ tablet    Sig:     Patient Instructions  - continue ASA 81 and lipitor for stroke prevention - follow  up with cardiology for heart failure - continue home meds - Follow up with your primary care physician for stroke risk factor modification. Recommend maintain blood pressure goal <130/80, diabetes with hemoglobin A1c goal below 6.5% and lipids with LDL cholesterol goal below 70 mg/dL.  - avoid fall - check BP at home - follow up as needed.   Rosalin Hawking, MD PhD [facility] [address], [place],  [address] [phone]

## 2014-10-26 ENCOUNTER — Encounter (HOSPITAL_COMMUNITY): Payer: Self-pay | Admitting: Internal Medicine

## 2014-10-27 ENCOUNTER — Other Ambulatory Visit (INDEPENDENT_AMBULATORY_CARE_PROVIDER_SITE_OTHER): Payer: Medicare Other

## 2014-10-27 DIAGNOSIS — D500 Iron deficiency anemia secondary to blood loss (chronic): Secondary | ICD-10-CM

## 2014-10-27 LAB — CBC WITH DIFFERENTIAL/PLATELET
BASOS ABS: 0 10*3/uL (ref 0.0–0.1)
Basophils Relative: 0.5 % (ref 0.0–3.0)
EOS PCT: 1.1 % (ref 0.0–5.0)
Eosinophils Absolute: 0.1 10*3/uL (ref 0.0–0.7)
HCT: 36 % (ref 36.0–46.0)
Hemoglobin: 11.4 g/dL — ABNORMAL LOW (ref 12.0–15.0)
Lymphocytes Relative: 20 % (ref 12.0–46.0)
Lymphs Abs: 1.1 10*3/uL (ref 0.7–4.0)
MCHC: 31.7 g/dL (ref 30.0–36.0)
MCV: 88.4 fl (ref 78.0–100.0)
Monocytes Absolute: 0.4 10*3/uL (ref 0.1–1.0)
Monocytes Relative: 6.5 % (ref 3.0–12.0)
NEUTROS PCT: 71.9 % (ref 43.0–77.0)
Neutro Abs: 3.9 10*3/uL (ref 1.4–7.7)
Platelets: 181 10*3/uL (ref 150.0–400.0)
RBC: 4.07 Mil/uL (ref 3.87–5.11)
RDW: 17.4 % — AB (ref 11.5–15.5)
WBC: 5.4 10*3/uL (ref 4.0–10.5)

## 2015-02-05 ENCOUNTER — Other Ambulatory Visit: Payer: Self-pay | Admitting: Cardiovascular Disease

## 2015-02-19 ENCOUNTER — Ambulatory Visit (INDEPENDENT_AMBULATORY_CARE_PROVIDER_SITE_OTHER): Payer: Medicare Other | Admitting: Cardiology

## 2015-02-19 ENCOUNTER — Encounter: Payer: Self-pay | Admitting: Cardiology

## 2015-02-19 VITALS — BP 112/66 | HR 64 | Ht 60.0 in | Wt 161.0 lb

## 2015-02-19 DIAGNOSIS — I639 Cerebral infarction, unspecified: Secondary | ICD-10-CM | POA: Diagnosis not present

## 2015-02-19 DIAGNOSIS — I480 Paroxysmal atrial fibrillation: Secondary | ICD-10-CM | POA: Diagnosis not present

## 2015-02-19 MED ORDER — HYDRALAZINE HCL 25 MG PO TABS: 12.5000 mg | ORAL_TABLET | Freq: Three times a day (TID) | ORAL | Status: DC

## 2015-02-19 NOTE — Patient Instructions (Addendum)
Weigh daily Call [phone] if weight climbs more than 3 pounds in a day or 5 pounds in a week. No salt to very little salt in your diet.  No more than 2000 mg in a day. Call if increased shortness of breath or increased swelling.   Your physician wants you to follow-up in: [place] will receive a reminder letter in the mail two months in advance. If you don't receive a letter, please call our office to schedule the follow-up appointment.

## 2015-02-19 NOTE — Progress Notes (Signed)
Cardiology Office Note   Date:  02/19/2015   ID:  [name], DOB [birth year], MRN [id number]  PCP:  [name], MD  Cardiologist:  Dr. Gwenlyn Found    Chief Complaint  Patient presents with  . Edema    feet and legs  . Shortness of Breath    with lying down and exertion      History of Present Illness: [name] is a 81 y.o. female who presents for a history of nonischemic myopathy documented by cardiac catheterization performed at Dr. Debara Pickett 05/24/10. She had minimal CAD with an ejection fraction of 20%. Her other problems include treated hypertension, hyperlipidemia and diabetes. She has had a stroke in the past and walks with the aid of a walker.  No longer on coumadin due to GI bleed.  She was hospitalized from 08/2014  with congestive heart failure/resp. Failure/pna. She was hospitalized again in April/ 2015 with a stroke and a non-STEMI. Consideration was given to putting her on oral anticoagulant event monitor was obtained which did not show A. Fib with rapid PVCs and a short burst of nonsustained ventricular tachycardia.  She uses a walker to ambulate.  Her son lives with her.     2-D echo 08/2014 revealed an EF of 30% with severe mitral regurgitation. She did have a nonischemic Myoview in July of 2015. She was diuresed and discharged on oral antibiotics. We will get labs from Dr. Berdine Addison, her PCP who she has recently seen.    She denies any SOB except on occ.  Dr. Berdine Addison recently increased her demadex to 20 mg tabs.  This helped her edema.  No chest pain.    Past Medical History  Diagnosis Date  . Asthma   . CAD (coronary artery disease)     Pt reports MI in 2006 (no documentation).  Cardiolite in 05/2002 and 07/2006 did not reveal any reversible ischemia.  Pt follows with [facility]. Rex Kras at [facility].  . CHF (congestive heart failure)     EF 25-30% with dilated LV, mild LVH, severe hypokinesis, and mod-severe reduction in RV function  . Osteoporosis   . HYPERTENSION 08/03/2006  .  GASTROPARESIS, DIABETIC 08/03/2006  . HYPERLIPIDEMIA 08/03/2006  . OBSTRUCTIVE SLEEP APNEA 01/06/2008  . PERIPHERAL NEUROPATHY 08/03/2006  . GERD 08/03/2006  . LOW BACK PAIN, CHRONIC 08/03/2006  . OSTEOPOROSIS 03/21/2009  . CEREBRAL EMBOLISM, WITH INFARCTION 07/02/2010  . Angina   . Myocardial infarction "2 or 3"  . Pneumonia 02/26/12    "a few times; probably even today"  . Shortness of breath     "all the time"  . DIABETES MELLITUS, TYPE II 11/04/1983  . Blood transfusion 08/2011  . Lower GI bleeding 08/2011 and 01/2014  . Chronic daily headache   . Migraines   . Stroke summer 2011    "made my left hip worse"  . Uterine cancer   . Nonischemic cardiomyopathy 05/2010    Left heart catheterization:2011. Nonobstructive coronary artery disease.  . Pulmonary hypertension   . Hypertension   . NSTEMI (non-ST elevated myocardial infarction) 01/2014    type II with CVA  . Acute embolic stroke 3/81/0175  . Non-ST elevation MI (NSTEMI) 02/04/2014  . E. coli UTI 03/02/2014  . Renal failure, acute 02/04/2014  . Iron deficiency anemia secondary to blood loss (chronic) 01/06/2008    Qualifier: Diagnosis of  By: Tomasa Hosteller MD, Veronique D.   . Nonsustained ventricular tachycardia   . Angiodysplasia of intestine with hemorrhage 05/13/2014  .  CKD (chronic kidney disease) stage 4, GFR 15-29 ml/min 08/21/2014    Past Surgical History  Procedure Laterality Date  . Esophagogastroduodenoscopy  08/26/2011    Procedure: ESOPHAGOGASTRODUODENOSCOPY (EGD);  Surgeon: [name], MD;  Location: [facility] ENDOSCOPY;  Service: Endoscopy;  Laterality: N/A;  . Colonoscopy  08/28/2011    Procedure: COLONOSCOPY;  Surgeon: [name], MD;  Location: [place];  Service: Endoscopy;  Laterality: N/A;  . Vaginal hysterectomy    . Tubal ligation    . Cataract extraction w/ intraocular lens  implant, bilateral    . Toe surgery      "right big toe; operated on it to straighten it out; it was under"  . Polysomnogram   10/17/2005    AHI-7.28/hr. AHI REM-20.8/hr. Average oxygen saturation range during REM and NREM was 97%. Lowest oxygen saturation during REM sleep was 90%.  . Carotid duplex  05/28/2010    No significant extracranial carotid artery stenosis demonstrated. Vertebrals are patent w/ antegrade flow.  . Cardiac catheterization  05/24/2010    No intervention - recommed medical therapy.  . Cardiovascular stress test  08/07/2006    Moderate-severe defect seen in Basal inferior, Mid inferoseptal, Mid inferior, Mid inferolateral, and Apical inferior regions - consistent w/ infarct/scar. No scintigraphic evidence of inducible myocardial ischemia.  . Transthoracic echocardiogram  08/29/2011    EF 55-60%, moderate LVH,   . Esophagogastroduodenoscopy N/A 02/08/2014    Procedure: ESOPHAGOGASTRODUODENOSCOPY (EGD);  Surgeon: [name], MD;  Location: [facility] ENDOSCOPY;  Service: Endoscopy;  Laterality: N/A;  . Colonoscopy N/A 05/12/2014    Procedure: COLONOSCOPY;  Surgeon: [name], MD;  Location: [place];  Service: Endoscopy;  Laterality: N/A;  . Enteroscopy N/A 05/13/2014    Procedure: ENTEROSCOPY;  Surgeon: [name], MD;  Location: [place];  Service: Endoscopy;  Laterality: N/A;  . Enteroscopy N/A 07/13/2014    Procedure: small bowel enteroscopy;  Surgeon: Jerene Bears, MD;  Location: [facility] ENDOSCOPY;  Service: Endoscopy;  Laterality: N/A;  requests slim colonoscope     Current Outpatient Prescriptions  Medication Sig Dispense Refill  . acetaminophen (TYLENOL) 500 MG tablet Take 500 mg by mouth every 6 (six) hours as needed for moderate pain or headache.    . albuterol (PROAIR HFA) 108 (90 BASE) MCG/ACT inhaler Inhale 2 puffs into the lungs every 6 (six) hours as needed for wheezing. 8.5 g 11  . amLODipine (NORVASC) 5 MG tablet TAKE 1 TABLET EVERY DAY 30 tablet 11  . atorvastatin (LIPITOR) 40 MG tablet Take 1 tablet (40 mg total) by mouth daily at 6 PM. 30 tablet 11  . ferrous sulfate 325 (65  FE) MG tablet TAKE 1 TABLET (325 MG TOTAL) BY MOUTH 2 (TWO) TIMES DAILY WITH A MEAL. 60 tablet 5  . hydrALAZINE (APRESOLINE) 25 MG tablet Take 0.5 tablets (12.5 mg total) by mouth every 8 (eight) hours. 90 tablet 0  . insulin glargine (LANTUS) 100 UNIT/ML injection 5 units in the a.m. and 8 units each bedtime (Patient taking differently: Inject 5-8 Units into the skin 2 (two) times daily. 5 units in the a.m. and 8 units each bedtime) 10 mL 11  . isosorbide mononitrate (IMDUR) 60 MG 24 hr tablet Take 1 tablet (60 mg total) by mouth daily. 30 tablet 2  . KLOR-CON 10 10 MEQ tablet Take 20 mEq by mouth daily.     Marland Kitchen loratadine (ALLERGY RELIEF) 10 MG tablet Take 10 mg by mouth daily.    . magnesium  oxide (MAG-OX) 400 (241.3 MG) MG tablet Take 1 tablet (400 mg total) by mouth 2 (two) times daily. 60 tablet 1  . metoprolol succinate (TOPROL-XL) 25 MG 24 hr tablet TAKE 1 TABLET (25 MG TOTAL) BY MOUTH DAILY. 30 tablet 5  . omeprazole (PRILOSEC) 20 MG capsule Take 1 capsule (20 mg total) by mouth daily before breakfast. 30 capsule 11  . polyethylene glycol (MIRALAX / GLYCOLAX) packet Take 17 g by mouth daily.    Marland Kitchen torsemide (DEMADEX) 10 MG tablet Take 20 mg by mouth daily.      No current facility-administered medications for this visit.    Allergies:   Baking soda-fluoride and Magnesium hydroxide    Social History:  The patient  reports that she has never smoked. She quit smokeless tobacco use about 8 years ago. Her smokeless tobacco use included Snuff. She reports that she does not drink alcohol or use illicit drugs.   Family History:  The patient's family history includes Diabetes insipidus in her mother; Hypertension in her child, father, mother, and sister; Stomach cancer in her brother.    ROS:  General:no colds or fevers,  weight up and down Skin:no rashes or ulcers HEENT:no blurred vision, no congestion CV:see HPI PUL:see HPI GI:no diarrhea constipation or melena, no indigestion GU:no  hematuria, no dysuria MS:no joint pain, no claudication Neuro:no syncope, no lightheadedness Endo:+ diabetes stable followed by PCP, no thyroid disease  Wt Readings from Last 3 Encounters:  02/19/15 161 lb (73.029 kg)  10/23/14 148 lb (67.132 kg)  09/01/14 164 lb 14.4 oz (74.798 kg)     PHYSICAL EXAM: VS:  BP 112/66 mmHg  Pulse 64  Ht 5' (1.524 m)  Wt 161 lb (73.029 kg)  BMI 31.44 kg/m2  LMP 12/19/1968 , BMI Body mass index is 31.44 kg/(m^2). General:Pleasant affect, NAD Skin:Warm and dry, brisk capillary refill HEENT:normocephalic, sclera clear, mucus membranes moist Neck:supple, no JVD, no bruits  Heart:S1S2 RRR without murmur, gallup, rub or click Lungs:clear without rales, rhonchi, or wheezes PJK:DTOI, non tender, + BS, do not palpate liver spleen or masses Ext:1+ lower ext edema, 2+ pedal pulses, 2+ radial pulses Neuro:alert and oriented, MAE, follows commands, + facial symmetry    EKG:  EKG is ordered today. The ekg ordered today demonstrates SR with Rt axis deviation, non specific intraventricular block.  No changes from 08/20/15.     Recent Labs: 07/12/2014: ALT 12 08/19/2014: Magnesium 2.0 08/22/2014: Pro B Natriuretic peptide (BNP) 13748.0* 09/01/2014: BUN 29*; Creatinine 1.77*; Potassium 4.3; Sodium 138 10/27/2014: Hemoglobin 11.4*; Platelets 181.0    Lipid Panel    Component Value Date/Time   CHOL 130 02/09/2014 0650   TRIG 61 02/09/2014 0650   HDL 60 02/09/2014 0650   CHOLHDL 2.2 02/09/2014 0650   VLDL 12 02/09/2014 0650   LDLCALC 58 02/09/2014 0650       Other studies Reviewed: Additional studies/ records that were reviewed today include: reviewed previous  notes.   ASSESSMENT AND PLAN: Chronic diastolic and systolic CHF EF  71-24% The patient has a history of nonischemic cardio myopathy with cardiac catheterization performed in 2011 by Dr. Debara Pickett revealing essentially normal coronary arteries. She was admitted with shortness of breath several  months ago and was seen in consultation by Dr. Claiborne Billings. She was treated for pneumonia. 2-D echo revealed an ejection fraction in the 30% range with mitral regurgitation. She was diuresed as well. Currently with increase of diuretics, stable today.  She will follow upwith Dr. Adora Fridge in  6 months unless problems  Essential hypertension History of hypertension with blood pressure measured today of 112/66. She is on amlodipine, hydralazine and metoprolol. We'll continue her current medications at current dosing.   Mitral regurgitation 2-D echocardiogram performed 08/20/14 revealed an ejection fraction of 30-35% with severe mitral regurgitation. She is not a candidate for any operative percutaneous procedure given her age and comorbidities. She is currently compensated on appropriate medications including diuretics. Continue current medications. Would recheck echo in Nov.   HYPERLIPIDEMIA History of hyperlipidemia on atorvastatin. Continue current medications we will check with [facility] clinic for recent labs.  If none would recheck lipids.     Current medicines are reviewed with the patient today.  The patient Has no concerns regarding medicines.  The following changes have been made:  See above Labs/ tests ordered today include:see above  Disposition:   FU:  see above  Signed, Isaiah Serge, NP  02/19/2015 9:58 AM    [facility] [address], [place], [address] [place], [place] Phone: [phone]; Fax: [phone]

## 2015-02-22 ENCOUNTER — Encounter: Payer: Self-pay | Admitting: Cardiovascular Disease

## 2015-02-26 ENCOUNTER — Other Ambulatory Visit: Payer: Self-pay | Admitting: Internal Medicine

## 2015-03-30 ENCOUNTER — Other Ambulatory Visit: Payer: Self-pay | Admitting: Cardiology

## 2015-03-30 NOTE — Telephone Encounter (Signed)
Rx(s) sent to pharmacy electronically.  

## 2015-04-04 ENCOUNTER — Encounter (HOSPITAL_COMMUNITY): Payer: Self-pay | Admitting: Emergency Medicine

## 2015-04-04 ENCOUNTER — Inpatient Hospital Stay (HOSPITAL_COMMUNITY)
Admission: EM | Admit: 2015-04-04 | Discharge: 2015-04-07 | DRG: 811 | Disposition: A | Discharge status: Home / Self Care | Payer: Medicare Other | Attending: Internal Medicine | Admitting: Internal Medicine

## 2015-04-04 ENCOUNTER — Emergency Department (HOSPITAL_COMMUNITY): Payer: Medicare Other

## 2015-04-04 DIAGNOSIS — I252 Old myocardial infarction: Secondary | ICD-10-CM | POA: Diagnosis not present

## 2015-04-04 DIAGNOSIS — M810 Age-related osteoporosis without current pathological fracture: Secondary | ICD-10-CM | POA: Diagnosis present

## 2015-04-04 DIAGNOSIS — E1143 Type 2 diabetes mellitus with diabetic autonomic (poly)neuropathy: Secondary | ICD-10-CM | POA: Diagnosis present

## 2015-04-04 DIAGNOSIS — Z791 Long term (current) use of non-steroidal anti-inflammatories (NSAID): Secondary | ICD-10-CM

## 2015-04-04 DIAGNOSIS — R195 Other fecal abnormalities: Secondary | ICD-10-CM

## 2015-04-04 DIAGNOSIS — Z8249 Family history of ischemic heart disease and other diseases of the circulatory system: Secondary | ICD-10-CM | POA: Diagnosis not present

## 2015-04-04 DIAGNOSIS — Z8673 Personal history of transient ischemic attack (TIA), and cerebral infarction without residual deficits: Secondary | ICD-10-CM

## 2015-04-04 DIAGNOSIS — E785 Hyperlipidemia, unspecified: Secondary | ICD-10-CM | POA: Diagnosis present

## 2015-04-04 DIAGNOSIS — I429 Cardiomyopathy, unspecified: Secondary | ICD-10-CM | POA: Diagnosis present

## 2015-04-04 DIAGNOSIS — Z8542 Personal history of malignant neoplasm of other parts of uterus: Secondary | ICD-10-CM

## 2015-04-04 DIAGNOSIS — Z794 Long term (current) use of insulin: Secondary | ICD-10-CM

## 2015-04-04 DIAGNOSIS — Z79899 Other long term (current) drug therapy: Secondary | ICD-10-CM

## 2015-04-04 DIAGNOSIS — K219 Gastro-esophageal reflux disease without esophagitis: Secondary | ICD-10-CM | POA: Diagnosis present

## 2015-04-04 DIAGNOSIS — G8929 Other chronic pain: Secondary | ICD-10-CM | POA: Diagnosis present

## 2015-04-04 DIAGNOSIS — I5043 Acute on chronic combined systolic (congestive) and diastolic (congestive) heart failure: Secondary | ICD-10-CM

## 2015-04-04 DIAGNOSIS — I2510 Atherosclerotic heart disease of native coronary artery without angina pectoris: Secondary | ICD-10-CM | POA: Diagnosis present

## 2015-04-04 DIAGNOSIS — G629 Polyneuropathy, unspecified: Secondary | ICD-10-CM | POA: Diagnosis present

## 2015-04-04 DIAGNOSIS — D649 Anemia, unspecified: Secondary | ICD-10-CM

## 2015-04-04 DIAGNOSIS — Z87891 Personal history of nicotine dependence: Secondary | ICD-10-CM

## 2015-04-04 DIAGNOSIS — M545 Low back pain: Secondary | ICD-10-CM | POA: Diagnosis present

## 2015-04-04 DIAGNOSIS — E1165 Type 2 diabetes mellitus with hyperglycemia: Secondary | ICD-10-CM | POA: Diagnosis present

## 2015-04-04 DIAGNOSIS — N184 Chronic kidney disease, stage 4 (severe): Secondary | ICD-10-CM | POA: Diagnosis present

## 2015-04-04 DIAGNOSIS — IMO0002 Reserved for concepts with insufficient information to code with codable children: Secondary | ICD-10-CM | POA: Diagnosis present

## 2015-04-04 DIAGNOSIS — Z91048 Other nonmedicinal substance allergy status: Secondary | ICD-10-CM | POA: Diagnosis not present

## 2015-04-04 DIAGNOSIS — Q2733 Arteriovenous malformation of digestive system vessel: Secondary | ICD-10-CM | POA: Diagnosis not present

## 2015-04-04 DIAGNOSIS — K3184 Gastroparesis: Secondary | ICD-10-CM | POA: Diagnosis present

## 2015-04-04 DIAGNOSIS — I10 Essential (primary) hypertension: Secondary | ICD-10-CM

## 2015-04-04 DIAGNOSIS — K921 Melena: Secondary | ICD-10-CM | POA: Diagnosis present

## 2015-04-04 DIAGNOSIS — D62 Acute posthemorrhagic anemia: Principal | ICD-10-CM | POA: Diagnosis present

## 2015-04-04 DIAGNOSIS — G4733 Obstructive sleep apnea (adult) (pediatric): Secondary | ICD-10-CM | POA: Diagnosis present

## 2015-04-04 DIAGNOSIS — I272 Other secondary pulmonary hypertension: Secondary | ICD-10-CM | POA: Diagnosis present

## 2015-04-04 DIAGNOSIS — D500 Iron deficiency anemia secondary to blood loss (chronic): Secondary | ICD-10-CM | POA: Diagnosis present

## 2015-04-04 DIAGNOSIS — I129 Hypertensive chronic kidney disease with stage 1 through stage 4 chronic kidney disease, or unspecified chronic kidney disease: Secondary | ICD-10-CM | POA: Diagnosis present

## 2015-04-04 DIAGNOSIS — K922 Gastrointestinal hemorrhage, unspecified: Secondary | ICD-10-CM

## 2015-04-04 HISTORY — DX: Gastrointestinal hemorrhage, unspecified: K92.2

## 2015-04-04 LAB — COMPREHENSIVE METABOLIC PANEL
ALBUMIN: 2.8 g/dL — AB (ref 3.5–5.0)
ALT: 20 U/L (ref 14–54)
AST: 17 U/L (ref 15–41)
Alkaline Phosphatase: 123 U/L (ref 38–126)
Anion gap: 8 (ref 5–15)
BUN: 36 mg/dL — ABNORMAL HIGH (ref 6–20)
CALCIUM: 8.9 mg/dL (ref 8.9–10.3)
CO2: 24 mmol/L (ref 22–32)
Chloride: 105 mmol/L (ref 101–111)
Creatinine, Ser: 2.16 mg/dL — ABNORMAL HIGH (ref 0.44–1.00)
GFR calc Af Amer: 24 mL/min — ABNORMAL LOW (ref >60)
GFR calc non Af Amer: 21 mL/min — ABNORMAL LOW (ref >60)
Glucose, Bld: 270 mg/dL — ABNORMAL HIGH (ref 65–99)
Potassium: 4.4 mmol/L (ref 3.5–5.1)
Sodium: 137 mmol/L (ref 135–145)
TOTAL PROTEIN: 6.1 g/dL — AB (ref 6.5–8.1)
Total Bilirubin: 0.7 mg/dL (ref 0.3–1.2)

## 2015-04-04 LAB — PROTIME-INR
INR: 1.18 (ref 0.00–1.49)
PROTHROMBIN TIME: 15.2 s (ref 11.6–15.2)

## 2015-04-04 LAB — I-STAT CHEM 8, ED
BUN: 35 mg/dL — ABNORMAL HIGH (ref 6–20)
CHLORIDE: 104 mmol/L (ref 101–111)
CREATININE: 2.2 mg/dL — AB (ref 0.44–1.00)
Calcium, Ion: 1.19 mmol/L (ref 1.13–1.30)
GLUCOSE: 273 mg/dL — AB (ref 65–99)
HCT: 25 % — ABNORMAL LOW (ref 36.0–46.0)
Hemoglobin: 8.5 g/dL — ABNORMAL LOW (ref 12.0–15.0)
Potassium: 4.4 mmol/L (ref 3.5–5.1)
SODIUM: 138 mmol/L (ref 135–145)
TCO2: 23 mmol/L (ref 0–100)

## 2015-04-04 LAB — CBC WITH DIFFERENTIAL/PLATELET
BASOS ABS: 0 10*3/uL (ref 0.0–0.1)
Basophils Relative: 1 % (ref 0–1)
EOS ABS: 0 10*3/uL (ref 0.0–0.7)
EOS PCT: 1 % (ref 0–5)
HCT: 22.7 % — ABNORMAL LOW (ref 36.0–46.0)
Hemoglobin: 7.3 g/dL — ABNORMAL LOW (ref 12.0–15.0)
LYMPHS ABS: 1 10*3/uL (ref 0.7–4.0)
LYMPHS PCT: 22 % (ref 12–46)
MCH: 29.2 pg (ref 26.0–34.0)
MCHC: 32.2 g/dL (ref 30.0–36.0)
MCV: 90.8 fL (ref 78.0–100.0)
Monocytes Absolute: 0.3 10*3/uL (ref 0.1–1.0)
Monocytes Relative: 6 % (ref 3–12)
NEUTROS ABS: 3.1 10*3/uL (ref 1.7–7.7)
Neutrophils Relative %: 70 % (ref 43–77)
PLATELETS: 217 10*3/uL (ref 150–400)
RBC: 2.5 MIL/uL — AB (ref 3.87–5.11)
RDW: 18.6 % — ABNORMAL HIGH (ref 11.5–15.5)
WBC: 4.3 10*3/uL (ref 4.0–10.5)

## 2015-04-04 LAB — BRAIN NATRIURETIC PEPTIDE: B Natriuretic Peptide: 1383.6 pg/mL — ABNORMAL HIGH (ref 0.0–100.0)

## 2015-04-04 LAB — POC OCCULT BLOOD, ED: Fecal Occult Bld: POSITIVE — AB

## 2015-04-04 LAB — GLUCOSE, CAPILLARY
GLUCOSE-CAPILLARY: 199 mg/dL — AB (ref 65–99)
Glucose-Capillary: 261 mg/dL — ABNORMAL HIGH (ref 65–99)

## 2015-04-04 LAB — CBG MONITORING, ED: Glucose-Capillary: 265 mg/dL — ABNORMAL HIGH (ref 65–99)

## 2015-04-04 LAB — I-STAT TROPONIN, ED: Troponin i, poc: 0.03 ng/mL (ref 0.00–0.08)

## 2015-04-04 LAB — PREPARE RBC (CROSSMATCH)

## 2015-04-04 MED ORDER — SODIUM CHLORIDE 0.9 % IJ SOLN: 3.0000 mL | INTRAMUSCULAR | Status: DC | PRN

## 2015-04-04 MED ORDER — ONDANSETRON HCL 4 MG PO TABS: 4.0000 mg | ORAL_TABLET | Freq: Four times a day (QID) | ORAL | Status: DC | PRN

## 2015-04-04 MED ORDER — ONDANSETRON HCL 4 MG/2ML IJ SOLN: 4.0000 mg | Freq: Four times a day (QID) | INTRAMUSCULAR | Status: DC | PRN

## 2015-04-04 MED ORDER — PANTOPRAZOLE SODIUM 40 MG PO TBEC
40.0000 mg | DELAYED_RELEASE_TABLET | Freq: Every day | ORAL | Status: DC
  Administered 2015-04-05 – 2015-04-07 (×3): 40 mg via ORAL
  Filled 2015-04-04 (×4): qty 1

## 2015-04-04 MED ORDER — SODIUM CHLORIDE 0.9 % IJ SOLN
3.0000 mL | Freq: Two times a day (BID) | INTRAMUSCULAR | Status: DC
  Administered 2015-04-04 – 2015-04-07 (×3): 3 mL via INTRAVENOUS

## 2015-04-04 MED ORDER — INSULIN ASPART 100 UNIT/ML ~~LOC~~ SOLN
0.0000 [IU] | Freq: Three times a day (TID) | SUBCUTANEOUS | Status: DC
  Administered 2015-04-04: 5 [IU] via SUBCUTANEOUS
  Administered 2015-04-05 (×2): 3 [IU] via SUBCUTANEOUS
  Administered 2015-04-06 (×2): 2 [IU] via SUBCUTANEOUS

## 2015-04-04 MED ORDER — MORPHINE SULFATE 2 MG/ML IJ SOLN: 0.5000 mg | INTRAMUSCULAR | Status: DC | PRN

## 2015-04-04 MED ORDER — SODIUM CHLORIDE 0.9 % IV SOLN: 250.0000 mL | INTRAVENOUS | Status: DC | PRN

## 2015-04-04 MED ORDER — LORAZEPAM 2 MG/ML IJ SOLN: 1.0000 mg | Freq: Once | INTRAMUSCULAR | Status: DC

## 2015-04-04 MED ORDER — PANTOPRAZOLE SODIUM 40 MG IV SOLR: 40.0000 mg | Freq: Two times a day (BID) | INTRAVENOUS | Status: DC

## 2015-04-04 MED ORDER — SODIUM CHLORIDE 0.9 % IJ SOLN
3.0000 mL | Freq: Two times a day (BID) | INTRAMUSCULAR | Status: DC
  Administered 2015-04-05 – 2015-04-07 (×5): 3 mL via INTRAVENOUS

## 2015-04-04 MED ORDER — PANTOPRAZOLE SODIUM 40 MG IV SOLR
40.0000 mg | Freq: Once | INTRAVENOUS | Status: AC
  Administered 2015-04-04: 40 mg via INTRAVENOUS
  Filled 2015-04-04: qty 40

## 2015-04-04 MED ORDER — SODIUM CHLORIDE 0.9 % IV SOLN
10.0000 mL/h | Freq: Once | INTRAVENOUS | Status: AC
  Administered 2015-04-04: 10 mL/h via INTRAVENOUS

## 2015-04-04 NOTE — H&P (Signed)
[facility]          History and Physical    PCP:   [name], MD   EDP: Verdene Rio, PA  Chief Complaint:  Weakness, black stool  HPI: [name] is a 81 y.o. female with an extensive past medical history of coronary artery disease, A-fib, congestive heart failure with EF of 25-30% (nonischemic cardiomyopathy), hypertension, hyperlipidemia, obstructive sleep apnea, diabetes mellitus gastroparesis and Embolic CVA in 1610. She was previously on anticoagulation but this was discontinued due to GI bleed. 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 She subsequently had an admission in November 2015 for a CHF exacerbation. Today she is brought in by her son due to extreme weakness. Patient is a very poor historian and hence the son offers most of the details. Patient became extremely weak this morning was unable to get out of bed, when he went to change his mother's bed linens he noticed dark smelly stool which he recognized as it having blood, since he is familiar with this presentation he decided to bring her into the hospital. In the hospital her stool has been FOBT positive and dark, her hemoglobin was found to be 7.3. We have been asked to admit her for further evaluation and management.  Allergies:   Allergies  Allergen Reactions  . Baking Soda-Fluoride [Sodium Fluoride] Nausea And Vomiting  . Magnesium Hydroxide Nausea And Vomiting      Past Medical History  Diagnosis Date  . Asthma   . CAD (coronary artery disease)     Pt reports MI in 2006 (no documentation).  Cardiolite in 05/2002 and 07/2006 did not reveal any reversible ischemia.  Pt follows with Dr. Rex Kras at [facility].  . CHF (congestive heart failure)     EF 25-30% with dilated LV, mild LVH, severe hypokinesis, and  mod-severe reduction in RV function  . Osteoporosis   . HYPERTENSION 08/03/2006  . GASTROPARESIS, DIABETIC 08/03/2006  . HYPERLIPIDEMIA 08/03/2006  . OBSTRUCTIVE SLEEP APNEA 01/06/2008  . PERIPHERAL NEUROPATHY 08/03/2006  . GERD 08/03/2006  . LOW BACK PAIN, CHRONIC 08/03/2006  . OSTEOPOROSIS 03/21/2009  . CEREBRAL EMBOLISM, WITH INFARCTION 07/02/2010  . Angina   . Myocardial infarction "2 or 3"  . Pneumonia 02/26/12    "a few times; probably even today"  . Shortness of breath     "all the time"  . DIABETES MELLITUS, TYPE II 11/04/1983  . Blood transfusion 08/2011  . Lower GI bleeding 08/2011 and 01/2014  . Chronic daily headache   . Migraines   . Stroke summer 2011    "made my left hip worse"  . Uterine cancer   . Nonischemic cardiomyopathy 05/2010    Left heart catheterization:2011. Nonobstructive coronary artery disease.  . Pulmonary hypertension   . Hypertension   . NSTEMI (non-ST elevated myocardial infarction) 01/2014    type II with CVA  . Acute embolic stroke 9/60/4540  . Non-ST elevation MI (NSTEMI) 02/04/2014  . E. coli UTI 03/02/2014  . Renal failure, acute 02/04/2014  . Iron deficiency anemia secondary to blood loss (chronic) 01/06/2008    Qualifier: Diagnosis of  By: Tomasa Hosteller MD, Veronique D.   . Nonsustained ventricular tachycardia   . Angiodysplasia of intestine with hemorrhage 05/13/2014  .  CKD (chronic kidney disease) stage 4, GFR 15-29 ml/min 08/21/2014  . Assistance needed for mobility     walker    Past Surgical History  Procedure Laterality Date  . Esophagogastroduodenoscopy  08/26/2011    Procedure: ESOPHAGOGASTRODUODENOSCOPY (EGD);  Surgeon: [name], MD;  Location: [facility] ENDOSCOPY;  Service: Endoscopy;  Laterality: N/A;  . Colonoscopy  08/28/2011    Procedure: COLONOSCOPY;  Surgeon: [name], MD;  Location: [place];  Service: Endoscopy;  Laterality: N/A;  . Vaginal hysterectomy    . Tubal ligation    . Cataract extraction w/ intraocular lens   implant, bilateral    . Toe surgery      "right big toe; operated on it to straighten it out; it was under"  . Polysomnogram  10/17/2005    AHI-7.28/hr. AHI REM-20.8/hr. Average oxygen saturation range during REM and NREM was 97%. Lowest oxygen saturation during REM sleep was 90%.  . Carotid duplex  05/28/2010    No significant extracranial carotid artery stenosis demonstrated. Vertebrals are patent w/ antegrade flow.  . Cardiac catheterization  05/24/2010    No intervention - recommed medical therapy.  . Cardiovascular stress test  08/07/2006    Moderate-severe defect seen in Basal inferior, Mid inferoseptal, Mid inferior, Mid inferolateral, and Apical inferior regions - consistent w/ infarct/scar. No scintigraphic evidence of inducible myocardial ischemia.  . Transthoracic echocardiogram  08/29/2011    EF 55-60%, moderate LVH,   . Esophagogastroduodenoscopy N/A 02/08/2014    Procedure: ESOPHAGOGASTRODUODENOSCOPY (EGD);  Surgeon: [name], MD;  Location: [facility] ENDOSCOPY;  Service: Endoscopy;  Laterality: N/A;  . Colonoscopy N/A 05/12/2014    Procedure: COLONOSCOPY;  Surgeon: [name], MD;  Location: [place];  Service: Endoscopy;  Laterality: N/A;  . Enteroscopy N/A 05/13/2014    Procedure: ENTEROSCOPY;  Surgeon: [name], MD;  Location: [place];  Service: Endoscopy;  Laterality: N/A;  . Enteroscopy N/A 07/13/2014    Procedure: small bowel enteroscopy;  Surgeon: Jerene Bears, MD;  Location: [facility] ENDOSCOPY;  Service: Endoscopy;  Laterality: N/A;  requests slim colonoscope    Prior to Admission medications   Medication Sig Start Date End Date Taking? Authorizing Provider  acetaminophen (TYLENOL) 500 MG tablet Take 500 mg by mouth every 6 (six) hours as needed for moderate pain or headache.   Yes Historical Provider, MD  albuterol (PROAIR HFA) 108 (90 BASE) MCG/ACT inhaler Inhale 2 puffs into the lungs every 6 (six) hours as needed for wheezing. 05/25/14  Yes Daniel J Angiulli,  PA-C  amLODipine (NORVASC) 5 MG tablet TAKE 1 TABLET EVERY DAY 09/14/14  Yes Lorretta Harp, MD  atorvastatin (LIPITOR) 40 MG tablet Take 1 tablet (40 mg total) by mouth daily at 6 PM. 05/25/14  Yes Lavon Paganini Angiulli, PA-C  ferrous sulfate 325 (65 FE) MG tablet TAKE 1 TABLET (325 MG TOTAL) BY MOUTH 2 (TWO) TIMES DAILY WITH A MEAL. 10/19/14  Yes [name], MD  hydrALAZINE (APRESOLINE) 25 MG tablet Take 0.5 tablets (12.5 mg total) by mouth every 8 (eight) hours. 02/19/15  Yes Isaiah Serge, NP  insulin glargine (LANTUS) 100 UNIT/ML injection 5 units in the a.m. and 8 units each bedtime Patient taking differently: Inject 14 Units into the skin 2 (two) times daily.  05/25/14  Yes Daniel J Angiulli, PA-C  isosorbide mononitrate (IMDUR) 60 MG 24 hr tablet Take 1 tablet (60 mg total) by mouth daily. 05/25/14  Yes Daniel J Angiulli, PA-C  KLOR-CON 10  10 MEQ tablet Take 20 mEq by mouth daily.  10/09/14  Yes Historical Provider, MD  loratadine (ALLERGY RELIEF) 10 MG tablet Take 10 mg by mouth daily.   Yes Historical Provider, MD  magnesium oxide (MAG-OX) 400 (241.3 MG) MG tablet Take 1 tablet (400 mg total) by mouth 2 (two) times daily. 05/25/14  Yes Daniel J Angiulli, PA-C  metoprolol succinate (TOPROL-XL) 25 MG 24 hr tablet TAKE 1 TABLET (25 MG TOTAL) BY MOUTH DAILY. 02/05/15  Yes Lorretta Harp, MD  omeprazole (PRILOSEC) 20 MG capsule Take 1 capsule (20 mg total) by mouth daily before breakfast. 05/25/14  Yes Lavon Paganini Angiulli, PA-C  polyethylene glycol (MIRALAX / GLYCOLAX) packet Take 17 g by mouth daily.   Yes Historical Provider, MD  torsemide (DEMADEX) 10 MG tablet Take 20 mg by mouth daily.    Yes Historical Provider, MD  atorvastatin (LIPITOR) 40 MG tablet TAKE 1 TABLET (40 MG TOTAL) BY MOUTH DAILY AT 6 PM. Patient not taking: Reported on 04/04/2015 03/30/15   Lorretta Harp, MD  omeprazole (PRILOSEC) 20 MG capsule TAKE 1 CAPSULE (20 MG TOTAL) BY MOUTH DAILY BEFORE BREAKFAST. Patient not taking:  Reported on 04/04/2015 02/26/15   [name], MD    Social History:  reports that she has never smoked. She quit smokeless tobacco use about 8 years ago. Her smokeless tobacco use included Snuff. She reports that she does not drink alcohol or use illicit drugs.  Family History  Problem Relation Age of Onset  . Diabetes insipidus Mother   . Hypertension Mother   . Hypertension Father   . Hypertension Sister   . Hypertension Child   . Stomach cancer Brother     Review of Systems:   difficult to obtain given current mental state and patient being a very poor historian.   Physical Exam: Blood pressure 151/60, pulse 61, temperature 98.2 F (36.8 C), temperature source Oral, resp. rate 24, last menstrual period 12/19/1968, SpO2 98 %.  general: Awake, alert, can answer simple questions but cannot give me a lot of insight into her current medical situation. HEENT: Normocephalic, atraumatic, pupils equal round and reactive to light, extraocular movements intact, moist mucous membranes. Neck: Supple, no JVD, no lymphadenopathy, no bruits, no goiter.  Cardiovascular: Regular, soft systolic ejection murmur. Lungs: Clear to auscultation bilaterally. Abdomen: Soft, nontender, nondistended, positive bowel sounds Extremities: No clubbing, cyanosis or edema, positive pulses. Neurologic: Grossly intact and nonfocal.   Labs on Admission:  Results for orders placed or performed during the hospital encounter of 04/04/15 (from the past 48 hour(s))  CBG monitoring, ED     Status: Abnormal   Collection Time: 04/04/15 10:29 AM  Result Value Ref Range   Glucose-Capillary 265 (H) 65 - 99 mg/dL   Comment 1 Notify RN    Comment 2 Document in Chart   Comprehensive metabolic panel     Status: Abnormal   Collection Time: 04/04/15 11:00 AM  Result Value Ref Range   Sodium 137 135 - 145 mmol/L   Potassium 4.4 3.5 - 5.1 mmol/L   Chloride 105 101 - 111 mmol/L   CO2 24 22 - 32 mmol/L   Glucose, Bld 270  (H) 65 - 99 mg/dL   BUN 36 (H) 6 - 20 mg/dL   Creatinine, Ser 2.16 (H) 0.44 - 1.00 mg/dL   Calcium 8.9 8.9 - 10.3 mg/dL   Total Protein 6.1 (L) 6.5 - 8.1 g/dL   Albumin 2.8 (L) 3.5 - 5.0 g/dL  AST 17 15 - 41 U/L   ALT 20 14 - 54 U/L   Alkaline Phosphatase 123 38 - 126 U/L   Total Bilirubin 0.7 0.3 - 1.2 mg/dL   GFR calc non Af Amer 21 (L) >60 mL/min   GFR calc Af Amer 24 (L) >60 mL/min    Comment: (NOTE) The eGFR has been calculated using the CKD EPI equation. This calculation has not been validated in all clinical situations. eGFR's persistently <60 mL/min signify possible Chronic Kidney Disease.    Anion gap 8 5 - 15  Type and screen     Status: None (Preliminary result)   Collection Time: 04/04/15 11:00 AM  Result Value Ref Range   ABO/RH(D) B POS    Antibody Screen NEG    Sample Expiration 04/07/2015    Unit Number [id number]    Blood Component Type RED CELLS,LR    Unit division 00    Status of Unit ISSUED    Transfusion Status OK TO TRANSFUSE    Crossmatch Result Compatible    Unit Number [id number]    Blood Component Type RED CELLS,LR    Unit division 00    Status of Unit ALLOCATED    Transfusion Status OK TO TRANSFUSE    Crossmatch Result Compatible   CBC with Differential/Platelet     Status: Abnormal   Collection Time: 04/04/15 11:00 AM  Result Value Ref Range   WBC 4.3 4.0 - 10.5 K/uL   RBC 2.50 (L) 3.87 - 5.11 MIL/uL   Hemoglobin 7.3 (L) 12.0 - 15.0 g/dL   HCT 56.5 (L) 96.4 - 43.6 %   MCV 90.8 78.0 - 100.0 fL   MCH 29.2 26.0 - 34.0 pg   MCHC 32.2 30.0 - 36.0 g/dL   RDW 67.2 (H) 86.2 - 60.0 %   Platelets 217 150 - 400 K/uL   Neutrophils Relative % 70 43 - 77 %   Neutro Abs 3.1 1.7 - 7.7 K/uL   Lymphocytes Relative 22 12 - 46 %   Lymphs Abs 1.0 0.7 - 4.0 K/uL   Monocytes Relative 6 3 - 12 %   Monocytes Absolute 0.3 0.1 - 1.0 K/uL   Eosinophils Relative 1 0 - 5 %   Eosinophils Absolute 0.0 0.0 - 0.7 K/uL   Basophils Relative 1 0 - 1 %    Basophils Absolute 0.0 0.0 - 0.1 K/uL  Protime-INR     Status: None   Collection Time: 04/04/15 11:00 AM  Result Value Ref Range   Prothrombin Time 15.2 11.6 - 15.2 seconds   INR 1.18 0.00 - 1.49  Brain natriuretic peptide     Status: Abnormal   Collection Time: 04/04/15 11:00 AM  Result Value Ref Range   B Natriuretic Peptide 1383.6 (H) 0.0 - 100.0 pg/mL  I-stat troponin, ED     Status: None   Collection Time: 04/04/15 11:18 AM  Result Value Ref Range   Troponin i, poc 0.03 0.00 - 0.08 ng/mL   Comment 3            Comment: Due to the release kinetics of cTnI, a negative result within the first hours of the onset of symptoms does not rule out myocardial infarction with certainty. If myocardial infarction is still suspected, repeat the test at appropriate intervals.   I-stat chem 8, ed     Status: Abnormal   Collection Time: 04/04/15 11:20 AM  Result Value Ref Range   Sodium 138 135 - 145 mmol/L  Potassium 4.4 3.5 - 5.1 mmol/L   Chloride 104 101 - 111 mmol/L   BUN 35 (H) 6 - 20 mg/dL   Creatinine, Ser 2.20 (H) 0.44 - 1.00 mg/dL   Glucose, Bld 273 (H) 65 - 99 mg/dL   Calcium, Ion 1.19 1.13 - 1.30 mmol/L   TCO2 23 0 - 100 mmol/L   Hemoglobin 8.5 (L) 12.0 - 15.0 g/dL   HCT 25.0 (L) 36.0 - 46.0 %  POC occult blood, ED     Status: Abnormal   Collection Time: 04/04/15 11:57 AM  Result Value Ref Range   Fecal Occult Bld POSITIVE (A) NEGATIVE  Prepare RBC     Status: None   Collection Time: 04/04/15 12:00 PM  Result Value Ref Range   Order Confirmation ORDER PROCESSED BY BLOOD BANK     Radiological Exams on Admission: Dg Chest Port 1 View  04/04/2015   CLINICAL DATA:  Shortness of breath.  EXAM: PORTABLE CHEST - 1 VIEW  COMPARISON:  08/21/2014  FINDINGS: Cardiomegaly with vascular congestion. No confluent airspace opacities, effusions or edema. No acute bony abnormality.  IMPRESSION: Cardiomegaly with vascular congestion.   Electronically Signed   By: Rolm Baptise M.D.   On:  04/04/2015 10:52    Assessment/Plan Principal Problem:   Melena Active Problems:   Blood loss anemia   DM (diabetes mellitus), type 2, uncontrolled   Hyperlipidemia   Essential hypertension   Acute on chronic combined systolic and diastolic CHF (congestive heart failure)   Melena -This seems to indicate a bleeding of an upper GI source. -Placed on Protonix IV twice a day. -GI, Nancy Marus, PA has already been notified by EDP and they will see patient in consultation. -We'll keep nothing by mouth in case of any studies planned for today.  Acute blood loss anemia -Secondary to GI bleed. -Hemoglobin was 7.3 on admission, EDP has ordered 2 units of PRBCs, transfusion has already started by the time I am assessing patient. -Follow CBC posttransfusion and every 8 hours for the first 24 hours.  Chronic combined systolic and diastolic heart failure -Echocardiogram from 11/15 shows an ejection fraction of 30-35% with grade 2 diastolic dysfunction. -Seems to be compensated at present. -She remains nothing by mouth given her principal problem, consider restarting heart failure medications in short order once GI determines plan of treatment to avoid acute decompensation.  Diabetes mellitus -Check hemoglobin A1c. -Start on a sensitive sliding scale.  Hyperlipidemia -Restart anti-hyperlipidemia next once oral route resumed.  History of atrial fibrillation -She is not anticoagulated due to prior history of GI bleeds, currently rate controlled.  DVT prophylaxis -SCDs given active GI bleed.  CODE STATUS -Full code as discussed with son at bedside.     Time Spent on Admission: 80 minutes  [facility] Pager: [phone] 04/04/2015, 2:00 PM

## 2015-04-04 NOTE — ED Notes (Signed)
Pt stated that she has been having SOB, also that she has heart problems, Black stools, and feeling weak.  Family member also stated that she has not been as talkative as normal and that all of this is very unusual for her. Pt also states that she is having chest pain.

## 2015-04-04 NOTE — ED Notes (Signed)
Attempted report X1

## 2015-04-04 NOTE — Progress Notes (Signed)
Utilization review complete. Kimari Lienhard RN CCM Case Mgmt phone [phone] 

## 2015-04-04 NOTE — Consult Note (Signed)
[facility] Consult: 2:51 PM 04/04/2015  LOS: 0 days    Referring Provider: Dr Jerilee Hoh Primary Care Physician:  [name], MD Primary Gastroenterologist:  Dr. Carlean Purl    Reason for Consultation:  Darj/FOBT + stool, Hgb 7.3   HPI: [name] is a 81 y.o. female.  PMH of nonischemic cardiomyopathy. EF 45% 8841,YSAYT 2 diastolic dysfunction, A fib (not on AC)  OSA, HTN, insulin-dependent type 2 DM, diabetic gastroparesis. CVA in past.  CKD.  Non STEMI 01/2014.   Hx GI bleeds and blood loss as well as chronic anemia.  Has required transfusions in 2012, 11/2013, 01/2014, 06/2014, 07/2014.  07/2014 SB enteroscopy: AVM at prox jejunum was ablated.  O/w normal mucosa.  05/2014 SB enteroscopy:  Non-bleeding AVM in prox jejunum, not ablated.   04/2014 Colonoscopy.  Dr Carlean Purl.  Normal study to cecum  EGD11/2012. For melena.  Moderate antral gastritis.  08/2011 Colonoscopy.  Normal. Suspect bleeding came from gastric erosions in setting of supratherapeutic INR H pylori clotest negative. Pt has not had capsule endoscopy.   Home meds include BID po Iron, Omeprazole 20 mg, stools are normally dark and sometimes she is constipated.  Appetite is great but starting in AM yesterday she was anorexic.  She took prune juice for constipation and stool was darker than usual last night and again this AM. She had AMS this AM, her attentive son brought her to ED from where she was admitted.  Since arrival she made c/o some left side/flank pain which is new but not severe. No nausea, in fact she is asking for something to eat/drink.  FOBT + and Hgb 7.3.  Hgb 11.4 on 10/27/14, 10.7 n 08/2014. MCV is normal.  BUN/creat worse corresponding with stage 4 CKD.  BNP is 1386.  Glucose 270s.  CXR with CM and vascular congestion.      Past Medical History  Diagnosis Date  . Asthma   . CAD (coronary artery disease)     Pt reports MI in 2006 (no documentation).  Cardiolite in 05/2002 and 07/2006 did not reveal any reversible ischemia.  Pt follows with Dr. Rex Kras at [facility].  . CHF (congestive heart failure)     EF 25-30% with dilated LV, mild LVH, severe hypokinesis, and mod-severe reduction in RV function  . Osteoporosis   . HYPERTENSION 08/03/2006  . GASTROPARESIS, DIABETIC 08/03/2006  . HYPERLIPIDEMIA 08/03/2006  . OBSTRUCTIVE SLEEP APNEA 01/06/2008  . PERIPHERAL NEUROPATHY 08/03/2006  . GERD 08/03/2006  . LOW BACK PAIN, CHRONIC 08/03/2006  . OSTEOPOROSIS 03/21/2009  . CEREBRAL EMBOLISM, WITH INFARCTION 07/02/2010  . Angina   . Myocardial infarction "2 or 3"  . Pneumonia 02/26/12    "a few times; probably even today"  . DIABETES MELLITUS, TYPE II 11/04/1983  . Blood transfusion     multiple in 2012, 2015.   . GI bleed 08/2011 and 01/2014  . Chronic daily headache     migraines as well.   . Stroke summer 2011    "made my left hip worse"  . Uterine cancer   .  Nonischemic cardiomyopathy 05/2010    Left heart catheterization:2011. Nonobstructive coronary artery disease.  . Pulmonary hypertension   . NSTEMI (non-ST elevated myocardial infarction) 01/2014    type II with CVA  . Acute embolic stroke 2/77/8242  . Non-ST elevation MI (NSTEMI) 02/04/2014  . E. coli UTI 03/02/2014  . Renal failure, acute 02/04/2014  . Iron deficiency anemia secondary to blood loss (chronic) 01/06/2008    Qualifier: Diagnosis of  By: Tomasa Hosteller MD, Veronique D.   . Nonsustained ventricular tachycardia   . Angiodysplasia of intestine with hemorrhage 05/13/2014  . CKD (chronic kidney disease) stage 4, GFR 15-29 ml/min 08/21/2014    Past Surgical History  Procedure Laterality Date  . Esophagogastroduodenoscopy  08/26/2011    Procedure: ESOPHAGOGASTRODUODENOSCOPY (EGD);  Surgeon: [name], MD;  Location[facility] ENDOSCOPY;  Service: Endoscopy;   Laterality: N/A;  . Colonoscopy  08/28/2011    Procedure: COLONOSCOPY;  Surgeon: [name], MD;  Location: [place];  Service: Endoscopy;  Laterality: N/A;  . Vaginal hysterectomy    . Tubal ligation    . Cataract extraction w/ intraocular lens  implant, bilateral    . Toe surgery      "right big toe; operated on it to straighten it out; it was under"  . Polysomnogram  10/17/2005    AHI-7.28/hr. AHI REM-20.8/hr. Average oxygen saturation range during REM and NREM was 97%. Lowest oxygen saturation during REM sleep was 90%.  . Carotid duplex  05/28/2010    No significant extracranial carotid artery stenosis demonstrated. Vertebrals are patent w/ antegrade flow.  . Cardiac catheterization  05/24/2010    No intervention - recommed medical therapy.  . Cardiovascular stress test  08/07/2006    Moderate-severe defect seen in Basal inferior, Mid inferoseptal, Mid inferior, Mid inferolateral, and Apical inferior regions - consistent w/ infarct/scar. No scintigraphic evidence of inducible myocardial ischemia.  . Transthoracic echocardiogram  08/29/2011    EF 55-60%, moderate LVH,   . Esophagogastroduodenoscopy N/A 02/08/2014    Procedure: ESOPHAGOGASTRODUODENOSCOPY (EGD);  Surgeon: [name], MD;  Location[facility] ENDOSCOPY;  Service: Endoscopy;  Laterality: N/A;  . Colonoscopy N/A 05/12/2014    Procedure: COLONOSCOPY;  Surgeon: [name], MD;  Location: [place];  Service: Endoscopy;  Laterality: N/A;  . Enteroscopy N/A 05/13/2014    Procedure: ENTEROSCOPY;  Surgeon: [name], MD;  Location: [place];  Service: Endoscopy;  Laterality: N/A;  . Enteroscopy N/A 07/13/2014    Procedure: small bowel enteroscopy;  Surgeon: Jerene Bears, MD;  Location: [facility] ENDOSCOPY;  Service: Endoscopy;  Laterality: N/A;  requests slim colonoscope    Prior to Admission medications   Medication Sig Start Date End Date Taking? Authorizing Provider  acetaminophen (TYLENOL) 500 MG tablet Take 500 mg by  mouth every 6 (six) hours as needed for moderate pain or headache.   Yes Historical Provider, MD  albuterol (PROAIR HFA) 108 (90 BASE) MCG/ACT inhaler Inhale 2 puffs into the lungs every 6 (six) hours as needed for wheezing. 05/25/14  Yes Daniel J Angiulli, PA-C  amLODipine (NORVASC) 5 MG tablet TAKE 1 TABLET EVERY DAY 09/14/14  Yes Lorretta Harp, MD  atorvastatin (LIPITOR) 40 MG tablet Take 1 tablet (40 mg total) by mouth daily at 6 PM. 05/25/14  Yes Lavon Paganini Angiulli, PA-C  ferrous sulfate 325 (65 FE) MG tablet TAKE 1 TABLET (325 MG TOTAL) BY MOUTH 2 (TWO) TIMES DAILY WITH A MEAL. 10/19/14  Yes [name], MD  hydrALAZINE (APRESOLINE) 25  MG tablet Take 0.5 tablets (12.5 mg total) by mouth every 8 (eight) hours. 02/19/15  Yes Isaiah Serge, NP  insulin glargine (LANTUS) 100 UNIT/ML injection 5 units in the a.m. and 8 units each bedtime Patient taking differently: Inject 14 Units into the skin 2 (two) times daily.  05/25/14  Yes Daniel J Angiulli, PA-C  isosorbide mononitrate (IMDUR) 60 MG 24 hr tablet Take 1 tablet (60 mg total) by mouth daily. 05/25/14  Yes Daniel J Angiulli, PA-C  KLOR-CON 10 10 MEQ tablet Take 20 mEq by mouth daily.  10/09/14  Yes Historical Provider, MD  loratadine (ALLERGY RELIEF) 10 MG tablet Take 10 mg by mouth daily.   Yes Historical Provider, MD  magnesium oxide (MAG-OX) 400 (241.3 MG) MG tablet Take 1 tablet (400 mg total) by mouth 2 (two) times daily. 05/25/14  Yes Daniel J Angiulli, PA-C  metoprolol succinate (TOPROL-XL) 25 MG 24 hr tablet TAKE 1 TABLET (25 MG TOTAL) BY MOUTH DAILY. 02/05/15  Yes Lorretta Harp, MD  omeprazole (PRILOSEC) 20 MG capsule Take 1 capsule (20 mg total) by mouth daily before breakfast. 05/25/14  Yes Lavon Paganini Angiulli, PA-C  polyethylene glycol (MIRALAX / GLYCOLAX) packet Take 17 g by mouth daily.   Yes Historical Provider, MD  torsemide (DEMADEX) 10 MG tablet Take 20 mg by mouth daily.    Yes Historical Provider, MD  atorvastatin (LIPITOR) 40 MG  tablet TAKE 1 TABLET (40 MG TOTAL) BY MOUTH DAILY AT 6 PM. Patient not taking: Reported on 04/04/2015 03/30/15   Lorretta Harp, MD  omeprazole (PRILOSEC) 20 MG capsule TAKE 1 CAPSULE (20 MG TOTAL) BY MOUTH DAILY BEFORE BREAKFAST. Patient not taking: Reported on 04/04/2015 02/26/15   [name], MD      Allergies as of 04/04/2015 - Review Complete 04/04/2015  Allergen Reaction Noted  . Baking soda-fluoride [sodium fluoride] Nausea And Vomiting 03/28/2011  . Magnesium hydroxide Nausea And Vomiting 08/28/2011    Family History  Problem Relation Age of Onset  . Diabetes insipidus Mother   . Hypertension Mother   . Hypertension Father   . Hypertension Sister   . Hypertension Child   . Stomach cancer Brother     History   Social History  . Marital Status: Widowed    Spouse Name: N/A  . Number of Children: 7  . Years of Education: N/A   Occupational History  . Disabled    Social History Main Topics  . Smoking status: Never Smoker   . Smokeless tobacco: Former Systems developer    Types: Snuff    Quit date: 10/13/2006     Comment: 02/26/12 "stopped snuff 4-6 years ago"  . Alcohol Use: No     Comment: "stopped drinking alcohol ~ 1980's"  . Drug Use: No  . Sexual Activity: No   Other Topics Concern  . Not on file   Social History Narrative   ** Merged History Encounter **       Lives with daughter    REVIEW OF SYSTEMS: Constitutional:  Weakness in last 24 hours ENT:  No nose bleeds Pulm:  No change in baseline.  No cough.  No orthopnea.  CV:  No palpitations, stable/chronic LE edema. No chest pain.   GU:  No hematuria, no frequency GI:  Per HPI.  No dysphagia Heme:  Per HPI   Transfusions:  Per HPI Neuro:  No headaches, no peripheral tingling or numbness Derm:  No itching, no rash or sores.  Endocrine:  No  sweats or chills.  No polyuria or dysuria Immunization:  Not queried Travel:  None beyond local counties in last few months.    PHYSICAL EXAM: Vital signs in last  24 hours: Filed Vitals:   04/04/15 1412  BP: 149/72  Pulse: 67  Temp: 97.8 F (36.6 C)  Resp: 16   Wt Readings from Last 3 Encounters:  02/19/15 161 lb (73.029 kg)  10/23/14 148 lb (67.132 kg)  09/01/14 164 lb 14.4 oz (74.798 kg)   General: alert, pleasantly confused a little bit obtunded, comfortable.  Frail, aged  Head:  Symmetric faces.  No swelling or signs of trauma  Eyes:  No icterus or pallor Ears:  ? HOH  Nose:  No discharge Mouth:  Clear, moist.  Neck:  No mass or JVD Lungs:  Clear bil.  No cough or dyspnea.  Overall reduced BS Heart: 3/6 harsh SEM.  RRR.  S1/S2 audible.  Abdomen:  Soft, NT, ND.  No mass or HSM.  No bruits or hernias.   Rectal: formed, hard, black stool in vault, tests 3+FOBT +, no masses   Musc/Skeltl: + kyphosis.  Osteoporotic appearance Extremities:  + bil LE edema, not pitting.   Neurologic:  Follows commands, no tremor.  No gross limb weakness Skin:  No rash, sores or telangectasia Tattoos:  none Nodes:  No cervical or inguinal adenopathy.    Psych:  Pleasant, relaxed, cooperative.        LAB RESULTS:  Recent Labs  04/04/15 1100 04/04/15 1120  WBC 4.3  --   HGB 7.3* 8.5*  HCT 22.7* 25.0*  PLT 217  --    BMET Lab Results  Component Value Date   NA 138 04/04/2015   NA 137 04/04/2015   NA 138 09/01/2014   K 4.4 04/04/2015   K 4.4 04/04/2015   K 4.3 09/01/2014   CL 104 04/04/2015   CL 105 04/04/2015   CL 105 09/01/2014   CO2 24 04/04/2015   CO2 26 09/01/2014   CO2 23 08/22/2014   GLUCOSE 273* 04/04/2015   GLUCOSE 270* 04/04/2015   GLUCOSE 196* 09/01/2014   BUN 35* 04/04/2015   BUN 36* 04/04/2015   BUN 29* 09/01/2014   CREATININE 2.20* 04/04/2015   CREATININE 2.16* 04/04/2015   CREATININE 1.77* 09/01/2014   CALCIUM 8.9 04/04/2015   CALCIUM 8.5 09/01/2014   CALCIUM 8.7 08/22/2014   LFT  Recent Labs  04/04/15 1100  PROT 6.1*  ALBUMIN 2.8*  AST 17  ALT 20  ALKPHOS 123  BILITOT 0.7   PT/INR Lab Results    Component Value Date   INR 1.18 04/04/2015   INR 1.24 07/11/2014   INR 1.41 05/10/2014    RADIOLOGY STUDIES: Dg Chest Port 1 View  04/04/2015   CLINICAL DATA:  Shortness of breath.  EXAM: PORTABLE CHEST - 1 VIEW  COMPARISON:  08/21/2014  FINDINGS: Cardiomegaly with vascular congestion. No confluent airspace opacities, effusions or edema. No acute bony abnormality.  IMPRESSION: Cardiomegaly with vascular congestion.   Electronically Signed   By: Rolm Baptise M.D.   On: 04/04/2015 10:52     IMPRESSION:   *  Recurrent GIB with recurrent, acute on chronic anemia.  On chronic po iron.   Suspect SB AVMs.  1 of 1 PRBCs currently transfusing.   *  Stage 4 CKD, this may be contributing to her anemia.     PLAN:     *  Transfuse as doing.  Dr Carlean Purl not inclined to repeat  EGD/enteroscopy so will let pt eat.  *   Stopped the IV Protonix and started once daily oral.    Azucena Freed  04/04/2015, 2:51 PM Pager: [phone]  [facility] Attending  I have also seen and assessed the patient and agree with the advanced practitioner's assessment and plan. My Hx and PE same.  Plan is to treat anemia - we know she has chronic blood loss anemia and hx AVM's so more bleeding from those likely. Also CKD contributing to anemia. If she does not hold Hgb and clear signs of hemorrhage would scope otherwise will do outpt f/u.  She and son agree with plan.   [name], MD, [facility] [phone] (pager) 04/04/2015 3:51 PM

## 2015-04-04 NOTE — ED Provider Notes (Signed)
CSN: [id number]     Arrival date & time 04/04/15  1007 History   First MD Initiated Contact with Patient 04/04/15 1010     Chief Complaint  Patient presents with  . Melena  . Shortness of Breath     (Consider location/radiation/quality/duration/timing/severity/associated sxs/prior Treatment) HPI   Level V Caveat- severe weakness  GI:     Dr. Carlean Purl PCP: [name], MD Blood pressure 128/62, pulse 64, temperature 98.3 F (36.8 C), temperature source Oral, resp. rate 20, last menstrual period 12/19/1968, SpO2 98 %.  [name] is a 81 y.o.female with a significant PMH of multiple comorbidities, CHF, hypertension, gastroparesis, chronic lower GI bleed, migraines, stroke, myocardial infarction, renal failure presents to the ER with complaints of chest pain, weakness, dark stools.  The patient's family report that she told them this morning she needed to go to hospital. She has been having chronic back stools but denies any bright red blood per rectum. She reports having chest pain to the left sternal border. She also is extremely weak. The patient is unable to provide a sufficient history of present illness due to weakness and being difficult to hear. Family reports she only agrees to go to the hospital when she is very ill. She is unable to qualify her chest pain any further. Pt alert and follows simple commands.   Past Medical History  Diagnosis Date  . Asthma   . CAD (coronary artery disease)     Pt reports MI in 2006 (no documentation).  Cardiolite in 05/2002 and 07/2006 did not reveal any reversible ischemia.  Pt follows with Dr. Rex Kras at [facility].  . CHF (congestive heart failure)     EF 25-30% with dilated LV, mild LVH, severe hypokinesis, and mod-severe reduction in RV function  . Osteoporosis   . HYPERTENSION 08/03/2006  . GASTROPARESIS, DIABETIC 08/03/2006  . HYPERLIPIDEMIA 08/03/2006  . OBSTRUCTIVE SLEEP APNEA 01/06/2008  . PERIPHERAL NEUROPATHY 08/03/2006  . GERD  08/03/2006  . LOW BACK PAIN, CHRONIC 08/03/2006  . OSTEOPOROSIS 03/21/2009  . CEREBRAL EMBOLISM, WITH INFARCTION 07/02/2010  . Angina   . Myocardial infarction "2 or 3"  . Pneumonia 02/26/12    "a few times; probably even today"  . Shortness of breath     "all the time"  . DIABETES MELLITUS, TYPE II 11/04/1983  . Blood transfusion 08/2011  . Lower GI bleeding 08/2011 and 01/2014  . Chronic daily headache   . Migraines   . Stroke summer 2011    "made my left hip worse"  . Uterine cancer   . Nonischemic cardiomyopathy 05/2010    Left heart catheterization:2011. Nonobstructive coronary artery disease.  . Pulmonary hypertension   . Hypertension   . NSTEMI (non-ST elevated myocardial infarction) 01/2014    type II with CVA  . Acute embolic stroke 04/03/6332  . Non-ST elevation MI (NSTEMI) 02/04/2014  . E. coli UTI 03/02/2014  . Renal failure, acute 02/04/2014  . Iron deficiency anemia secondary to blood loss (chronic) 01/06/2008    Qualifier: Diagnosis of  By: Tomasa Hosteller MD, Veronique D.   . Nonsustained ventricular tachycardia   . Angiodysplasia of intestine with hemorrhage 05/13/2014  . CKD (chronic kidney disease) stage 4, GFR 15-29 ml/min 08/21/2014  . Assistance needed for mobility     walker   Past Surgical History  Procedure Laterality Date  . Esophagogastroduodenoscopy  08/26/2011    Procedure: ESOPHAGOGASTRODUODENOSCOPY (EGD);  Surgeon: [name], MD;  Location: [facility] ENDOSCOPY;  Service: Endoscopy;  Laterality:  N/A;  . Colonoscopy  08/28/2011    Procedure: COLONOSCOPY;  Surgeon: [name], MD;  Location: [place];  Service: Endoscopy;  Laterality: N/A;  . Vaginal hysterectomy    . Tubal ligation    . Cataract extraction w/ intraocular lens  implant, bilateral    . Toe surgery      "right big toe; operated on it to straighten it out; it was under"  . Polysomnogram  10/17/2005    AHI-7.28/hr. AHI REM-20.8/hr. Average oxygen saturation range during REM and NREM was 97%.  Lowest oxygen saturation during REM sleep was 90%.  . Carotid duplex  05/28/2010    No significant extracranial carotid artery stenosis demonstrated. Vertebrals are patent w/ antegrade flow.  . Cardiac catheterization  05/24/2010    No intervention - recommed medical therapy.  . Cardiovascular stress test  08/07/2006    Moderate-severe defect seen in Basal inferior, Mid inferoseptal, Mid inferior, Mid inferolateral, and Apical inferior regions - consistent w/ infarct/scar. No scintigraphic evidence of inducible myocardial ischemia.  . Transthoracic echocardiogram  08/29/2011    EF 55-60%, moderate LVH,   . Esophagogastroduodenoscopy N/A 02/08/2014    Procedure: ESOPHAGOGASTRODUODENOSCOPY (EGD);  Surgeon: [name], MD;  Location: [facility] ENDOSCOPY;  Service: Endoscopy;  Laterality: N/A;  . Colonoscopy N/A 05/12/2014    Procedure: COLONOSCOPY;  Surgeon: [name], MD;  Location: [place];  Service: Endoscopy;  Laterality: N/A;  . Enteroscopy N/A 05/13/2014    Procedure: ENTEROSCOPY;  Surgeon: [name], MD;  Location: [place];  Service: Endoscopy;  Laterality: N/A;  . Enteroscopy N/A 07/13/2014    Procedure: small bowel enteroscopy;  Surgeon: Jerene Bears, MD;  Location: [facility] ENDOSCOPY;  Service: Endoscopy;  Laterality: N/A;  requests slim colonoscope   Family History  Problem Relation Age of Onset  . Diabetes insipidus Mother   . Hypertension Mother   . Hypertension Father   . Hypertension Sister   . Hypertension Child   . Stomach cancer Brother    History  Substance Use Topics  . Smoking status: Never Smoker   . Smokeless tobacco: Former Systems developer    Types: Snuff    Quit date: 10/13/2006     Comment: 02/26/12 "stopped snuff 4-6 years ago"  . Alcohol Use: No     Comment: "stopped drinking alcohol ~ 1980's"   OB History    No data available     Review of Systems  Level V Caveat- severe weakness  Allergies  Baking soda-fluoride and Magnesium hydroxide  Home  Medications   Prior to Admission medications   Medication Sig Start Date End Date Taking? Authorizing Provider  acetaminophen (TYLENOL) 500 MG tablet Take 500 mg by mouth every 6 (six) hours as needed for moderate pain or headache.   Yes Historical Provider, MD  albuterol (PROAIR HFA) 108 (90 BASE) MCG/ACT inhaler Inhale 2 puffs into the lungs every 6 (six) hours as needed for wheezing. 05/25/14  Yes Daniel J Angiulli, PA-C  amLODipine (NORVASC) 5 MG tablet TAKE 1 TABLET EVERY DAY 09/14/14  Yes Lorretta Harp, MD  atorvastatin (LIPITOR) 40 MG tablet Take 1 tablet (40 mg total) by mouth daily at 6 PM. 05/25/14  Yes Lavon Paganini Angiulli, PA-C  ferrous sulfate 325 (65 FE) MG tablet TAKE 1 TABLET (325 MG TOTAL) BY MOUTH 2 (TWO) TIMES DAILY WITH A MEAL. 10/19/14  Yes [name], MD  hydrALAZINE (APRESOLINE) 25 MG tablet Take 0.5 tablets (12.5 mg total) by mouth every 8 (  eight) hours. 02/19/15  Yes Isaiah Serge, NP  insulin glargine (LANTUS) 100 UNIT/ML injection 5 units in the a.m. and 8 units each bedtime Patient taking differently: Inject 14 Units into the skin 2 (two) times daily.  05/25/14  Yes Daniel J Angiulli, PA-C  isosorbide mononitrate (IMDUR) 60 MG 24 hr tablet Take 1 tablet (60 mg total) by mouth daily. 05/25/14  Yes Daniel J Angiulli, PA-C  KLOR-CON 10 10 MEQ tablet Take 20 mEq by mouth daily.  10/09/14  Yes Historical Provider, MD  loratadine (ALLERGY RELIEF) 10 MG tablet Take 10 mg by mouth daily.   Yes Historical Provider, MD  magnesium oxide (MAG-OX) 400 (241.3 MG) MG tablet Take 1 tablet (400 mg total) by mouth 2 (two) times daily. 05/25/14  Yes Daniel J Angiulli, PA-C  metoprolol succinate (TOPROL-XL) 25 MG 24 hr tablet TAKE 1 TABLET (25 MG TOTAL) BY MOUTH DAILY. 02/05/15  Yes Lorretta Harp, MD  omeprazole (PRILOSEC) 20 MG capsule Take 1 capsule (20 mg total) by mouth daily before breakfast. 05/25/14  Yes Lavon Paganini Angiulli, PA-C  polyethylene glycol (MIRALAX / GLYCOLAX) packet Take 17 g  by mouth daily.   Yes Historical Provider, MD  torsemide (DEMADEX) 10 MG tablet Take 20 mg by mouth daily.    Yes Historical Provider, MD  atorvastatin (LIPITOR) 40 MG tablet TAKE 1 TABLET (40 MG TOTAL) BY MOUTH DAILY AT 6 PM. Patient not taking: Reported on 04/04/2015 03/30/15   Lorretta Harp, MD  omeprazole (PRILOSEC) 20 MG capsule TAKE 1 CAPSULE (20 MG TOTAL) BY MOUTH DAILY BEFORE BREAKFAST. Patient not taking: Reported on 04/04/2015 02/26/15   [name], MD   BP 145/60 mmHg  Pulse 63  Temp(Src) 98.3 F (36.8 C) (Oral)  Resp 24  SpO2 99%  LMP 12/19/1968 Physical Exam  Constitutional: She appears well-developed and well-nourished. No distress.  HENT:  Head: Normocephalic and atraumatic.  Eyes: Pupils are equal, round, and reactive to light.  Neck: Normal range of motion. Neck supple.  Cardiovascular: Normal rate and regular rhythm.   Pulmonary/Chest: Effort normal and breath sounds normal. No accessory muscle usage. No respiratory distress. She has no decreased breath sounds. She has no wheezes. She has no rhonchi.  Abdominal: Soft.  Genitourinary: Guaiac positive stool.  No active bleeding or BRB.  Pt has dark tar appearing stool per rectum  Musculoskeletal:  Symmetrical lower extremity swelling that is mild.  Neurological: She is alert.  Pt follows simple commands. Oriented to person and place. Will answer questions in one word answers and at a light whisper.  Bilateral grip strengths symmetrical but weak. Bilateral lower extremity strength to feet symmetrical but weak.   Skin: Skin is warm and dry.  Nursing note and vitals reviewed.     ED Course  Procedures (including critical care time) Labs Review Labs Reviewed  COMPREHENSIVE METABOLIC PANEL - Abnormal; Notable for the following:    Glucose, Bld 270 (*)    BUN 36 (*)    Creatinine, Ser 2.16 (*)    Total Protein 6.1 (*)    Albumin 2.8 (*)    GFR calc non Af Amer 21 (*)    GFR calc Af Amer 24 (*)    All  other components within normal limits  CBC WITH DIFFERENTIAL/PLATELET - Abnormal; Notable for the following:    RBC 2.50 (*)    Hemoglobin 7.3 (*)    HCT 22.7 (*)    RDW 18.6 (*)    All  other components within normal limits  BRAIN NATRIURETIC PEPTIDE - Abnormal; Notable for the following:    B Natriuretic Peptide 1383.6 (*)    All other components within normal limits  CBG MONITORING, ED - Abnormal; Notable for the following:    Glucose-Capillary 265 (*)    All other components within normal limits  I-STAT CHEM 8, ED - Abnormal; Notable for the following:    BUN 35 (*)    Creatinine, Ser 2.20 (*)    Glucose, Bld 273 (*)    Hemoglobin 8.5 (*)    HCT 25.0 (*)    All other components within normal limits  POC OCCULT BLOOD, ED - Abnormal; Notable for the following:    Fecal Occult Bld POSITIVE (*)    All other components within normal limits  PROTIME-INR  OCCULT BLOOD X 1 CARD TO LAB, STOOL  I-STAT TROPOININ, ED  TYPE AND SCREEN  PREPARE RBC (CROSSMATCH)    Imaging Review Dg Chest Port 1 View  04/04/2015   CLINICAL DATA:  Shortness of breath.  EXAM: PORTABLE CHEST - 1 VIEW  COMPARISON:  08/21/2014  FINDINGS: Cardiomegaly with vascular congestion. No confluent airspace opacities, effusions or edema. No acute bony abnormality.  IMPRESSION: Cardiomegaly with vascular congestion.   Electronically Signed   By: Rolm Baptise M.D.   On: 04/04/2015 10:52     EKG Interpretation   Date/Time:  Wednesday April 04 2015 10:22:49 EDT Ventricular Rate:  72 PR Interval:  226 QRS Duration: 160 QT Interval:  509 QTC Calculation: 557 R Axis:   -98 Text Interpretation:  Normal sinus rhythm Prolonged PR interval  Nonspecific IVCD with LAD When compared with ECG of 08/19/2014 First degree  A-V block is now Present Otherwise no significant change Confirmed by  [facility]  MD, Nunzio Cory [phone]) on 04/04/2015 11:19:15 AM      MDM   Final diagnoses:  Gastrointestinal hemorrhage, unspecified  gastritis, unspecified gastrointestinal hemorrhage type  Anemia requiring transfusions   CRITICAL CARE Performed by: Linus Mako Total critical care time: 35 min Critical care time was exclusive of separately billable procedures and treating other patients. Critical care was necessary to treat or prevent imminent or life-threatening deterioration. Critical care was time spent personally by me on the following activities: development of treatment plan with patient and/or surrogate as well as nursing, discussions with consultants, evaluation of patient's response to treatment, examination of patient, obtaining history from patient or surrogate, ordering and performing treatments and interventions, ordering and review of laboratory studies, ordering and review of radiographic studies, pulse oximetry and re-evaluation of patient's condition.  12:00pm Patient is anemia due to GI blood loss- she is not actively hemorrhaging. She will require transfusion. Pt and family member have agreed to transfusion and are aware of diagnosis and plan.  2 units PRBC ordered.  [facility] Glyn Ade, PA-C)  contacted- they have been made aware of patients GI bleed, need for transfusion and have agreed to consult.  Dr. Jerilee Hoh has agreed for admission to inpatient Tele, with [facility].  Filed Vitals:   04/04/15 1215  BP: 145/60  Pulse: 63  Temp:   Resp: 24     Delos Haring, PA-C 04/04/15 [place], DO 04/08/15 1343

## 2015-04-04 NOTE — ED Notes (Signed)
CBG is 265. Notified nurse Estill Bamberg.

## 2015-04-05 DIAGNOSIS — D500 Iron deficiency anemia secondary to blood loss (chronic): Secondary | ICD-10-CM

## 2015-04-05 DIAGNOSIS — K921 Melena: Secondary | ICD-10-CM

## 2015-04-05 LAB — HEMOGLOBIN A1C
HEMOGLOBIN A1C: 9.3 % — AB (ref 4.8–5.6)
Mean Plasma Glucose: 220 mg/dL

## 2015-04-05 LAB — CBC
HCT: 29.3 % — ABNORMAL LOW (ref 36.0–46.0)
Hemoglobin: 9.4 g/dL — ABNORMAL LOW (ref 12.0–15.0)
MCH: 29 pg (ref 26.0–34.0)
MCHC: 32.1 g/dL (ref 30.0–36.0)
MCV: 90.4 fL (ref 78.0–100.0)
Platelets: 233 10*3/uL (ref 150–400)
RBC: 3.24 MIL/uL — ABNORMAL LOW (ref 3.87–5.11)
RDW: 17.7 % — ABNORMAL HIGH (ref 11.5–15.5)
WBC: 5.9 10*3/uL (ref 4.0–10.5)

## 2015-04-05 LAB — RETICULOCYTES
RBC.: 3.02 MIL/uL — ABNORMAL LOW (ref 3.87–5.11)
Retic Count, Absolute: 178.2 10*3/uL (ref 19.0–186.0)
Retic Ct Pct: 5.9 % — ABNORMAL HIGH (ref 0.4–3.1)

## 2015-04-05 LAB — FOLATE: Folate: 16.7 ng/mL (ref >5.9)

## 2015-04-05 LAB — BASIC METABOLIC PANEL
Anion gap: 9 (ref 5–15)
BUN: 39 mg/dL — AB (ref 6–20)
CO2: 23 mmol/L (ref 22–32)
CREATININE: 2.16 mg/dL — AB (ref 0.44–1.00)
Calcium: 9 mg/dL (ref 8.9–10.3)
Chloride: 108 mmol/L (ref 101–111)
GFR calc Af Amer: 24 mL/min — ABNORMAL LOW (ref >60)
GFR calc non Af Amer: 21 mL/min — ABNORMAL LOW (ref >60)
GLUCOSE: 96 mg/dL (ref 65–99)
POTASSIUM: 4.7 mmol/L (ref 3.5–5.1)
Sodium: 140 mmol/L (ref 135–145)

## 2015-04-05 LAB — IRON AND TIBC
Iron: 310 ug/dL — ABNORMAL HIGH (ref 28–170)
SATURATION RATIOS: 85 % — AB (ref 10.4–31.8)
TIBC: 363 ug/dL (ref 250–450)
UIBC: 53 ug/dL

## 2015-04-05 LAB — VITAMIN B12: Vitamin B-12: 951 pg/mL — ABNORMAL HIGH (ref 180–914)

## 2015-04-05 LAB — GLUCOSE, CAPILLARY
GLUCOSE-CAPILLARY: 203 mg/dL — AB (ref 65–99)
GLUCOSE-CAPILLARY: 207 mg/dL — AB (ref 65–99)
GLUCOSE-CAPILLARY: 193 mg/dL — AB (ref 65–99)
Glucose-Capillary: 88 mg/dL (ref 65–99)

## 2015-04-05 LAB — FERRITIN: FERRITIN: 19 ng/mL (ref 11–307)

## 2015-04-05 MED ORDER — POLYETHYLENE GLYCOL 3350 17 G PO PACK
17.0000 g | PACK | Freq: Every day | ORAL | Status: DC
  Administered 2015-04-06: 17 g via ORAL
  Filled 2015-04-05 (×3): qty 1

## 2015-04-05 MED ORDER — ATORVASTATIN CALCIUM 40 MG PO TABS
40.0000 mg | ORAL_TABLET | Freq: Every day | ORAL | Status: DC
  Administered 2015-04-05 – 2015-04-06 (×2): 40 mg via ORAL
  Filled 2015-04-05 (×2): qty 1

## 2015-04-05 MED ORDER — ALBUTEROL SULFATE (2.5 MG/3ML) 0.083% IN NEBU
2.5000 mg | INHALATION_SOLUTION | Freq: Four times a day (QID) | RESPIRATORY_TRACT | Status: DC | PRN
  Administered 2015-04-05 – 2015-04-06 (×2): 2.5 mg via RESPIRATORY_TRACT
  Filled 2015-04-05 (×2): qty 3

## 2015-04-05 MED ORDER — DARBEPOETIN ALFA 40 MCG/0.4ML IJ SOSY
40.0000 ug | PREFILLED_SYRINGE | INTRAMUSCULAR | Status: DC
  Administered 2015-04-05: 40 ug via SUBCUTANEOUS
  Filled 2015-04-05: qty 0.4

## 2015-04-05 MED ORDER — SODIUM CHLORIDE 0.9 % IV SOLN
510.0000 mg | Freq: Once | INTRAVENOUS | Status: AC
  Administered 2015-04-05: 510 mg via INTRAVENOUS
  Filled 2015-04-05: qty 17

## 2015-04-05 MED ORDER — POTASSIUM CHLORIDE ER 10 MEQ PO TBCR
20.0000 meq | EXTENDED_RELEASE_TABLET | Freq: Every day | ORAL | Status: DC
  Administered 2015-04-05 – 2015-04-07 (×3): 20 meq via ORAL
  Filled 2015-04-05 (×6): qty 2

## 2015-04-05 MED ORDER — MAGNESIUM OXIDE 400 (241.3 MG) MG PO TABS
400.0000 mg | ORAL_TABLET | Freq: Two times a day (BID) | ORAL | Status: DC
  Administered 2015-04-05 – 2015-04-07 (×5): 400 mg via ORAL
  Filled 2015-04-05 (×5): qty 1

## 2015-04-05 MED ORDER — TORSEMIDE 20 MG PO TABS
20.0000 mg | ORAL_TABLET | Freq: Every day | ORAL | Status: DC
  Administered 2015-04-05 – 2015-04-07 (×3): 20 mg via ORAL
  Filled 2015-04-05 (×3): qty 1

## 2015-04-05 MED ORDER — ISOSORBIDE MONONITRATE ER 30 MG PO TB24
30.0000 mg | ORAL_TABLET | Freq: Every day | ORAL | Status: DC
  Administered 2015-04-05 – 2015-04-07 (×3): 30 mg via ORAL
  Filled 2015-04-05 (×3): qty 1

## 2015-04-05 MED ORDER — METOPROLOL SUCCINATE ER 25 MG PO TB24
25.0000 mg | ORAL_TABLET | Freq: Every day | ORAL | Status: DC
  Administered 2015-04-05 – 2015-04-07 (×3): 25 mg via ORAL
  Filled 2015-04-05 (×3): qty 1

## 2015-04-05 NOTE — Progress Notes (Signed)
[facility] PROGRESS NOTE  JAMYAH FOLK RUE:[id number] DOB: May 12, 1933 DOA: 04/04/2015 PCP: [name], MD  Assessment/Plan:  Principal Problem:   Melena, with h/o recurrent GIB, worked up extensively in the past.  GI defers workup at this time. Agree with IV iron. Aranesp. Monitor h/h. Active Problems:   DM (diabetes mellitus), type 2, uncontrolled: ssi.   Hyperlipidemia   Essential hypertension: resume meds except norvask   Blood loss anemia: monitor chronic combined systolic and diastolic CHF (congestive heart failure): appears euvolemic  HPI/Subjective: Had "dark" stool. Per tech, formed and did not appear like melena  Objective: Filed Vitals:   04/05/15 1349  BP: 134/66  Pulse: 72  Temp: 98.4 F (36.9 C)  Resp: 20    Intake/Output Summary (Last 24 hours) at 04/05/15 1523 Last data filed at 04/05/15 1026  Gross per 24 hour  Intake    578 ml  Output    200 ml  Net    378 ml   Filed Weights   04/05/15 0516  Weight: 69.174 kg (152 lb 8 oz)    Exam:   General:  Comfortable. Walking in room without difficulty  Cardiovascular: RRR without MGR  Respiratory: CTA without WRR  Abdomen: S, NT, ND  Ext: no CCE  Basic Metabolic Panel:  Recent Labs Lab 04/04/15 1100 04/04/15 1120 04/05/15 0530  NA 137 138 140  K 4.4 4.4 4.7  CL 105 104 108  CO2 24  --  23  GLUCOSE 270* 273* 96  BUN 36* 35* 39*  CREATININE 2.16* 2.20* 2.16*  CALCIUM 8.9  --  9.0   Liver Function Tests:  Recent Labs Lab 04/04/15 1100  AST 17  ALT 20  ALKPHOS 123  BILITOT 0.7  PROT 6.1*  ALBUMIN 2.8*   No results for input(s): LIPASE, AMYLASE in the last 168 hours. No results for input(s): AMMONIA in the last 168 hours. CBC:  Recent Labs Lab 04/04/15 1100 04/04/15 1120 04/05/15 0530  WBC 4.3  --  5.9  NEUTROABS 3.1  --   --   HGB 7.3* 8.5* 9.4*  HCT 22.7* 25.0* 29.3*  MCV 90.8  --  90.4  PLT 217  --  233   Cardiac Enzymes: No results for input(s):  CKTOTAL, CKMB, CKMBINDEX, TROPONINI in the last 168 hours. BNP (last 3 results)  Recent Labs  04/04/15 1100  BNP 1383.6*    ProBNP (last 3 results)  Recent Labs  07/11/14 1855 08/19/14 0838 08/22/14 0740  PROBNP 6841.0* 17664.0* 13748.0*    CBG:  Recent Labs Lab 04/04/15 1029 04/04/15 1639 04/04/15 2047 04/05/15 0732 04/05/15 1125  GLUCAP 265* 261* 199* 88 203*    No results found for this or any previous visit (from the past 240 hour(s)).   Studies: Dg Chest Port 1 View  04/04/2015   CLINICAL DATA:  Shortness of breath.  EXAM: PORTABLE CHEST - 1 VIEW  COMPARISON:  08/21/2014  FINDINGS: Cardiomegaly with vascular congestion. No confluent airspace opacities, effusions or edema. No acute bony abnormality.  IMPRESSION: Cardiomegaly with vascular congestion.   Electronically Signed   By: Rolm Baptise M.D.   On: 04/04/2015 10:52    Scheduled Meds: . ferumoxytol  510 mg Intravenous Once  . insulin aspart  0-9 Units Subcutaneous TID WC  . pantoprazole  40 mg Oral Q0600  . polyethylene glycol  17 g Oral Daily  . sodium chloride  3 mL Intravenous Q12H  . sodium chloride  3 mL Intravenous Q12H  Continuous Infusions:   Time spent: 35 minutes  [facility] [email or web address], password [facility] 04/05/2015, 3:23 PM  LOS: 1 day

## 2015-04-05 NOTE — Progress Notes (Signed)
MEDICATION RELATED CONSULT NOTE - INITIAL   Pharmacy Consult for Darbopoeitin  Indication: Anemia of CKD  Allergies  Allergen Reactions  . Baking Soda-Fluoride [Sodium Fluoride] Nausea And Vomiting  . Magnesium Hydroxide Nausea And Vomiting    Patient Measurements: Weight: 152 lb 8 oz (69.174 kg) Adjusted Body Weight:   Vital Signs: Temp: 98.4 F (36.9 C) (06/23 1349) Temp Source: Oral (06/23 1349) BP: 134/66 mmHg (06/23 1349) Pulse Rate: 72 (06/23 1349) Intake/Output from previous day: 06/22 0701 - 06/23 0700 In: 335 [Blood:335] Out: 200 [Urine:200] Intake/Output from this shift: Total I/O In: 363 [P.O.:360; I.V.:3] Out: -   Labs:  Recent Labs  04/04/15 1100 04/04/15 1120 04/05/15 0530  WBC 4.3  --  5.9  HGB 7.3* 8.5* 9.4*  HCT 22.7* 25.0* 29.3*  PLT 217  --  233  CREATININE 2.16* 2.20* 2.16*  ALBUMIN 2.8*  --   --   PROT 6.1*  --   --   AST 17  --   --   ALT 20  --   --   ALKPHOS 123  --   --   BILITOT 0.7  --   --    Estimated Creatinine Clearance: 18.3 mL/min (by C-G formula based on Cr of 2.16).   Microbiology: No results found for this or any previous visit (from the past 720 hour(s)).  Medical History: Past Medical History  Diagnosis Date  . Asthma   . CAD (coronary artery disease)     Pt reports MI in 2006 (no documentation).  Cardiolite in 05/2002 and 07/2006 did not reveal any reversible ischemia.  Pt follows with Dr. Rex Kras at [facility].  . CHF (congestive heart failure)     EF 25-30% with dilated LV, mild LVH, severe hypokinesis, and mod-severe reduction in RV function  . Osteoporosis   . HYPERTENSION 08/03/2006  . GASTROPARESIS, DIABETIC 08/03/2006  . HYPERLIPIDEMIA 08/03/2006  . OBSTRUCTIVE SLEEP APNEA 01/06/2008  . PERIPHERAL NEUROPATHY 08/03/2006  . GERD 08/03/2006  . LOW BACK PAIN, CHRONIC 08/03/2006  . OSTEOPOROSIS 03/21/2009  . CEREBRAL EMBOLISM, WITH INFARCTION 07/02/2010  . Angina   . Myocardial infarction "2 or 3"  . Pneumonia  02/26/12    "a few times; probably even today"  . DIABETES MELLITUS, TYPE II 11/04/1983  . Blood transfusion     multiple in 2012, 2015.   . GI bleed 08/2011 and 01/2014  . Chronic daily headache     migraines as well.   . Stroke summer 2011    "made my left hip worse"  . Uterine cancer   . Nonischemic cardiomyopathy 05/2010    Left heart catheterization:2011. Nonobstructive coronary artery disease.  . Pulmonary hypertension   . NSTEMI (non-ST elevated myocardial infarction) 01/2014    type II with CVA  . Acute embolic stroke 3/81/0175  . Non-ST elevation MI (NSTEMI) 02/04/2014  . E. coli UTI 03/02/2014  . Renal failure, acute 02/04/2014  . Iron deficiency anemia secondary to blood loss (chronic) 01/06/2008    Qualifier: Diagnosis of  By: Tomasa Hosteller MD, Veronique D.   . Nonsustained ventricular tachycardia   . Angiodysplasia of intestine with hemorrhage 05/13/2014  . CKD (chronic kidney disease) stage 4, GFR 15-29 ml/min 08/21/2014    Medications:  Scheduled:  . atorvastatin  40 mg Oral q1800  . insulin aspart  0-9 Units Subcutaneous TID WC  . isosorbide mononitrate  30 mg Oral Daily  . magnesium oxide  400 mg Oral BID  . metoprolol succinate  25  mg Oral Daily  . pantoprazole  40 mg Oral Q0600  . polyethylene glycol  17 g Oral Daily  . potassium chloride  20 mEq Oral Daily  . sodium chloride  3 mL Intravenous Q12H  . sodium chloride  3 mL Intravenous Q12H  . torsemide  20 mg Oral Daily    Assessment: 81yo female CKD-4 presenting 6/22 with weakness, black stool with Hg 7.3 and up to 9.4 today s/p PRBC x 1.  It is expected that she has SB AVMs.  She has also received Feraheme x 1 dose and was on Fe-sulfate 325mg  bid pta.  Will dose Aranesp according to protocol  Cr 2.16  Goal of Therapy:  Correction of anemia  Plan:  Aranesp 38mcg SQ qThurs x 4 doses Resume Fe-sulfate BID as at home  Gracy Bruins, [facility]

## 2015-04-05 NOTE — Progress Notes (Addendum)
          Daily Rounding Note  04/05/2015, 9:18 AM  LOS: 1 day   SUBJECTIVE:       No complaints.  Denies pain, nausea, dyspnea.  Son felt she was more SOB this AM  OBJECTIVE:         Vital signs in last 24 hours:    Temp:  [97.5 F (36.4 C)-98.3 F (36.8 C)] 98.1 F (36.7 C) (06/23 0516) Pulse Rate:  [42-70] 59 (06/23 0516) Resp:  [20-26] 20 (06/23 0516) BP: (128-151)/(60-72) 134/62 mmHg (06/23 0516) SpO2:  [97 %-100 %] 97 % (06/23 0516) Weight:  [152 lb 8 oz (69.174 kg)] 152 lb 8 oz (69.174 kg) (06/23 0516) Last BM Date: 04/04/15 Filed Weights   04/05/15 0516  Weight: 152 lb 8 oz (69.174 kg)   General: frail, pleasant.  Comfortable.     Heart: RRR with harsh SEM Chest: clear bil.  No cough or dyspnea.  Abdomen: soft, NT, ND.  Active BS  Extremities: no CCE Neuro/Psych:  Pleasant, appropriate.  Moves all 4s.    Lab Results:  Recent Labs  04/04/15 1100 04/04/15 1120 04/05/15 0530  WBC 4.3  --  5.9  HGB 7.3* 8.5* 9.4*  HCT 22.7* 25.0* 29.3*  PLT 217  --  233   BMET  Recent Labs  04/04/15 1100 04/04/15 1120 04/05/15 0530  NA 137 138 140  K 4.4 4.4 4.7  CL 105 104 108  CO2 24  --  23  GLUCOSE 270* 273* 96  BUN 36* 35* 39*  CREATININE 2.16* 2.20* 2.16*  CALCIUM 8.9  --  9.0   LFT  Recent Labs  04/04/15 1100  PROT 6.1*  ALBUMIN 2.8*  AST 17  ALT 20  ALKPHOS 123  BILITOT 0.7   PT/INR  Recent Labs  04/04/15 1100  LABPROT 15.2  INR 1.18    Studies/Results: Dg Chest Port 1 View  04/04/2015   CLINICAL DATA:  Shortness of breath.  EXAM: PORTABLE CHEST - 1 VIEW  COMPARISON:  08/21/2014  FINDINGS: Cardiomegaly with vascular congestion. No confluent airspace opacities, effusions or edema. No acute bony abnormality.  IMPRESSION: Cardiomegaly with vascular congestion.   Electronically Signed   By: Rolm Baptise M.D.   On: 04/04/2015 10:52    ASSESMENT:   * Recurrent transfusion requiring acute  on chronic anemia and GIB. On chronic po iron. Suspect SB AVMs.  Adequate/appropriate response to transfucion of 1 PRBC.  Ferritin low normal, Iron low, though improved from 7 months ago.    * Stage 4 CKD, likelycontributing to anemia.     PLAN   *  No plans for repeating enteroscopy.  Advance diet.  Resume daily Miralax.   *  Resume Omeprazole 20 mg q day. Po Iron BID at discharge.   *  Wonder if she should be starting Epogen/procrit?   ? Parenteral iron infusion?  Refer to hematologist and/or renal as outpt?     Azucena Freed  04/05/2015, 9:18 AM Pager: [phone]  Agree with Ms. Sandi Carne assessment and plan.  Assume she will be dc soon. I will have my RN arrange CBC at my office next week Outpatient hematology eval would be helpful i bet [name], MD, [facility]

## 2015-04-06 ENCOUNTER — Telehealth: Payer: Self-pay

## 2015-04-06 ENCOUNTER — Other Ambulatory Visit: Payer: Self-pay

## 2015-04-06 DIAGNOSIS — D649 Anemia, unspecified: Secondary | ICD-10-CM

## 2015-04-06 DIAGNOSIS — E1165 Type 2 diabetes mellitus with hyperglycemia: Secondary | ICD-10-CM

## 2015-04-06 LAB — GLUCOSE, CAPILLARY
GLUCOSE-CAPILLARY: 193 mg/dL — AB (ref 65–99)
GLUCOSE-CAPILLARY: 195 mg/dL — AB (ref 65–99)
Glucose-Capillary: 156 mg/dL — ABNORMAL HIGH (ref 65–99)
Glucose-Capillary: 187 mg/dL — ABNORMAL HIGH (ref 65–99)

## 2015-04-06 LAB — CBC
HCT: 27.3 % — ABNORMAL LOW (ref 36.0–46.0)
Hemoglobin: 8.8 g/dL — ABNORMAL LOW (ref 12.0–15.0)
MCH: 29.6 pg (ref 26.0–34.0)
MCHC: 32.2 g/dL (ref 30.0–36.0)
MCV: 91.9 fL (ref 78.0–100.0)
Platelets: 219 10*3/uL (ref 150–400)
RBC: 2.97 MIL/uL — ABNORMAL LOW (ref 3.87–5.11)
RDW: 17.7 % — ABNORMAL HIGH (ref 11.5–15.5)
WBC: 5.1 10*3/uL (ref 4.0–10.5)

## 2015-04-06 MED ORDER — INSULIN GLARGINE 100 UNIT/ML ~~LOC~~ SOLN
5.0000 [IU] | Freq: Two times a day (BID) | SUBCUTANEOUS | Status: DC
  Administered 2015-04-06 – 2015-04-07 (×3): 5 [IU] via SUBCUTANEOUS
  Filled 2015-04-06 (×4): qty 0.05

## 2015-04-06 MED ORDER — HYDRALAZINE HCL 25 MG PO TABS
12.5000 mg | ORAL_TABLET | Freq: Three times a day (TID) | ORAL | Status: DC
  Administered 2015-04-06 – 2015-04-07 (×3): 12.5 mg via ORAL
  Filled 2015-04-06 (×3): qty 1

## 2015-04-06 NOTE — Telephone Encounter (Signed)
-----   Message from [name], MD sent at 04/05/2015  6:33 PM EDT ----- Regarding: cbc please Arrange cbc dx blood loss anemia Tues 6/28 please  Instructions given at hosp

## 2015-04-06 NOTE — Progress Notes (Signed)
[facility] PROGRESS NOTE  DESSA LEDEE [id number]:[id number] DOB: 1933/10/06 DOA: 04/04/2015 PCP: [name], MD  Assessment/Plan:  Principal Problem:   Melena, with h/o recurrent GIB, worked up extensively in the past.  GI defers workup at this time. Hemoglobin dropped slightly today. We will monitor another day. Status post iron infusion and Aranesp yesterday. Discussed with Dr. Phillip Heal for tuneup. He recommends follow-up with nephrology for aranesp rather than hematology. Interestingly, anemia panel significant for normal iron but low ferritin. Active Problems:   DM (diabetes mellitus), type 2, uncontrolled: ssi. Lantus resumed at lower dose.   Hyperlipidemia   Essential hypertension: resume meds except norvask   Acute Blood loss anemia: monitor chronic combined systolic and diastolic CHF (congestive heart failure): appears euvolemic  HPI/Subjective: No vomiting. No definite melena reported. No dyspnea.  Objective: Filed Vitals:   04/06/15 1059  BP: 137/63  Pulse: 83  Temp:   Resp:     Intake/Output Summary (Last 24 hours) at 04/06/15 1220 Last data filed at 04/05/15 1300  Gross per 24 hour  Intake    120 ml  Output      0 ml  Net    120 ml   Filed Weights   04/05/15 0516 04/06/15 0322  Weight: 69.174 kg (152 lb 8 oz) 69.128 kg (152 lb 6.4 oz)    Exam:   General:  Comfortable. Eating lunch at side of bed  Cardiovascular: RRR without MGR  Respiratory: CTA without WRR  Abdomen: S, NT, ND  Ext: no CCE  Basic Metabolic Panel:  Recent Labs Lab 04/04/15 1100 04/04/15 1120 04/05/15 0530  NA 137 138 140  K 4.4 4.4 4.7  CL 105 104 108  CO2 24  --  23  GLUCOSE 270* 273* 96  BUN 36* 35* 39*  CREATININE 2.16* 2.20* 2.16*  CALCIUM 8.9  --  9.0   Liver Function Tests:  Recent Labs Lab 04/04/15 1100  AST 17  ALT 20  ALKPHOS 123  BILITOT 0.7  PROT 6.1*  ALBUMIN 2.8*   No results for input(s): LIPASE, AMYLASE in the last 168 hours. No results  for input(s): AMMONIA in the last 168 hours. CBC:  Recent Labs Lab 04/04/15 1100 04/04/15 1120 04/05/15 0530 04/06/15 0449  WBC 4.3  --  5.9 5.1  NEUTROABS 3.1  --   --   --   HGB 7.3* 8.5* 9.4* 8.8*  HCT 22.7* 25.0* 29.3* 27.3*  MCV 90.8  --  90.4 91.9  PLT 217  --  233 219   Cardiac Enzymes: No results for input(s): CKTOTAL, CKMB, CKMBINDEX, TROPONINI in the last 168 hours. BNP (last 3 results)  Recent Labs  04/04/15 1100  BNP 1383.6*    ProBNP (last 3 results)  Recent Labs  07/11/14 1855 08/19/14 0838 08/22/14 0740  PROBNP 6841.0* 17664.0* 13748.0*    CBG:  Recent Labs Lab 04/05/15 1125 04/05/15 1707 04/05/15 2113 04/06/15 0722 04/06/15 1149  GLUCAP 203* 207* 193* 156* 193*    No results found for this or any previous visit (from the past 240 hour(s)).   Studies: No results found.  Scheduled Meds: . atorvastatin  40 mg Oral q1800  . darbepoetin (ARANESP) injection - NON-DIALYSIS  40 mcg Subcutaneous Q Thu-1800  . insulin aspart  0-9 Units Subcutaneous TID WC  . isosorbide mononitrate  30 mg Oral Daily  . magnesium oxide  400 mg Oral BID  . metoprolol succinate  25 mg Oral Daily  . pantoprazole  40 mg  Oral Q0600  . polyethylene glycol  17 g Oral Daily  . potassium chloride  20 mEq Oral Daily  . sodium chloride  3 mL Intravenous Q12H  . sodium chloride  3 mL Intravenous Q12H  . torsemide  20 mg Oral Daily   Continuous Infusions:   Time spent: 35 minutes  [facility] [email or web address], password [facility] 04/06/2015, 12:20 PM  LOS: 2 days

## 2015-04-06 NOTE — Telephone Encounter (Signed)
Orders entered

## 2015-04-06 NOTE — Care Management Note (Signed)
Case Management Note  Patient Details  Name: [name] MRN: [id number] Date of Birth: 12-Apr-1933  Subjective/Objective: Pt admitted for Melena.                  Action/Plan: No needs identified at this time.    Expected Discharge Date:                  Expected Discharge Plan:  Home/Self Care  In-House Referral:     Discharge planning Services  CM Consult  Post Acute Care Choice:    Choice offered to:     DME Arranged:    DME Agency:     HH Arranged:    HH Agency:     Status of Service:  In process, will continue to follow  Medicare Important Message Given:  Yes Date Medicare IM Given:  04/06/15 Medicare IM give by:  Jacqlyn Krauss, RN,BSN  Date Additional Medicare IM Given:    Additional Medicare Important Message give by:     If discussed at [place] of Stay Meetings, dates discussed:    Additional Comments:  Bethena Roys, RN 04/06/2015, 3:01 PM

## 2015-04-07 DIAGNOSIS — D649 Anemia, unspecified: Secondary | ICD-10-CM

## 2015-04-07 LAB — CBC
HCT: 30.2 % — ABNORMAL LOW (ref 36.0–46.0)
HEMOGLOBIN: 9.7 g/dL — AB (ref 12.0–15.0)
MCH: 29.4 pg (ref 26.0–34.0)
MCHC: 32.1 g/dL (ref 30.0–36.0)
MCV: 91.5 fL (ref 78.0–100.0)
Platelets: 256 10*3/uL (ref 150–400)
RBC: 3.3 MIL/uL — AB (ref 3.87–5.11)
RDW: 17.9 % — ABNORMAL HIGH (ref 11.5–15.5)
WBC: 5.3 10*3/uL (ref 4.0–10.5)

## 2015-04-07 LAB — GLUCOSE, CAPILLARY
Glucose-Capillary: 66 mg/dL (ref 65–99)
Glucose-Capillary: 145 mg/dL — ABNORMAL HIGH (ref 65–99)

## 2015-04-07 MED ORDER — INSULIN GLARGINE 100 UNIT/ML ~~LOC~~ SOLN: 5.0000 [IU] | Freq: Two times a day (BID) | SUBCUTANEOUS | Status: DC

## 2015-04-07 NOTE — Discharge Summary (Signed)
Physician Discharge Summary  [name] JAS:[id number] DOB: [birth year] DOA: 04/04/2015  PCP: [name], MD  Admit date: 04/04/2015 Discharge date: 04/07/2015  Time spent: greater than 30 minutes  Recommendations for Outpatient Follow-up:  1. Monitor h/h 2. Consider outpatient aranesp. Referred to nephrology  Discharge Diagnoses:  Principal Problem:   Melena Active Problems:   DM (diabetes mellitus), type 2, uncontrolled   Hyperlipidemia   Essential hypertension   Blood loss anemia   Acute on chronic combined systolic and diastolic CHF (congestive heart failure)   Discharge Condition: stable  Diet recommendation: diabetic heart healthy  Filed Weights   04/05/15 0516 04/06/15 0322  Weight: 69.174 kg (152 lb 8 oz) 69.128 kg (152 lb 6.4 oz)    History of present illness:  81 y.o. female with an extensive past medical history of coronary artery disease, A-fib, congestive heart failure with EF of 25-30% (nonischemic cardiomyopathy), hypertension, hyperlipidemia, obstructive sleep apnea, diabetes mellitus gastroparesis and Embolic CVA in 4193. She was previously on anticoagulation but this was discontinued due to GI bleed. 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 She subsequently had an admission in November 2015 for a CHF exacerbation. Today she is brought in by her son due to extreme weakness. Patient is a very poor historian and hence the son offers most of the details. Patient became extremely weak this morning was unable to get out of bed, when he went to change his mother's bed linens he noticed dark smelly stool which he recognized as it having blood, since he is familiar with this presentation he decided to bring her into the hospital. In the hospital her stool has been FOBT positive and  dark, her hemoglobin was found to be 7.3. We have been asked to admit her for further evaluation and management.  Hospital Course:   Admitted to hospitalists. GI consulted. Transfused 2 units prbc.   Melena, with h/o recurrent GIB, worked up extensively in the past. GI defers workup at this time. Hemoglobin dropped slightly  Discussed with Dr. Beryle Beams. He recommends follow-up with nephrology for aranesp rather than hematology. Interestingly, anemia panel significant for normal iron but low ferritin. Active Problems:  DM (diabetes mellitus), type 2, uncontrolled: ssi. Lantus resumed at lower dose.  Hyperlipidemia  Essential hypertension: resume meds except norvask  Acute Blood loss anemia: monitor chronic combined systolic and diastolic CHF (congestive heart failure): appears euvolemic  Procedures:  none  Consultations:  [facility]  Discharge Exam: Filed Vitals:   04/06/15 1941  BP: 130/65  Pulse: 115  Temp: 98.1 F (36.7 C)  Resp: 22    General: a and o Cardiovascular: RRR Respiratory: CTA abd s, nt, nd  Discharge Instructions   Discharge Instructions    Diet - low sodium heart healthy    Complete by:  As directed      Diet Carb Modified    Complete by:  As directed      Discharge instructions    Complete by:  As directed   CHANGE LANTUS TO 5 UNITS TWICE DAILY.     Increase activity slowly    Complete by:  As directed           Current Discharge Medication List    CONTINUE these medications which have CHANGED   Details  insulin glargine (LANTUS) 100  UNIT/ML injection Inject 0.05 mLs (5 Units total) into the skin 2 (two) times daily. Qty: 10 mL, Refills: 11      CONTINUE these medications which have NOT CHANGED   Details  acetaminophen (TYLENOL) 500 MG tablet Take 500 mg by mouth every 6 (six) hours as needed for moderate pain or headache.    albuterol (PROAIR HFA) 108 (90 BASE) MCG/ACT inhaler Inhale 2 puffs into the lungs every 6 (six) hours  as needed for wheezing. Qty: 8.5 g, Refills: 11    amLODipine (NORVASC) 5 MG tablet TAKE 1 TABLET EVERY DAY Qty: 30 tablet, Refills: 11    atorvastatin (LIPITOR) 40 MG tablet Take 1 tablet (40 mg total) by mouth daily at 6 PM. Qty: 30 tablet, Refills: 11    ferrous sulfate 325 (65 FE) MG tablet TAKE 1 TABLET (325 MG TOTAL) BY MOUTH 2 (TWO) TIMES DAILY WITH A MEAL. Qty: 60 tablet, Refills: 5    hydrALAZINE (APRESOLINE) 25 MG tablet Take 0.5 tablets (12.5 mg total) by mouth every 8 (eight) hours. Qty: 90 tablet, Refills: 3    isosorbide mononitrate (IMDUR) 60 MG 24 hr tablet Take 1 tablet (60 mg total) by mouth daily. Qty: 30 tablet, Refills: 2    KLOR-CON 10 10 MEQ tablet Take 20 mEq by mouth daily.     loratadine (ALLERGY RELIEF) 10 MG tablet Take 10 mg by mouth daily.    magnesium oxide (MAG-OX) 400 (241.3 MG) MG tablet Take 1 tablet (400 mg total) by mouth 2 (two) times daily. Qty: 60 tablet, Refills: 1    metoprolol succinate (TOPROL-XL) 25 MG 24 hr tablet TAKE 1 TABLET (25 MG TOTAL) BY MOUTH DAILY. Qty: 30 tablet, Refills: 5    omeprazole (PRILOSEC) 20 MG capsule Take 1 capsule (20 mg total) by mouth daily before breakfast. Qty: 30 capsule, Refills: 11   Associated Diagnoses: Erosive gastritis with hemorrhage    polyethylene glycol (MIRALAX / GLYCOLAX) packet Take 17 g by mouth daily.    torsemide (DEMADEX) 10 MG tablet Take 20 mg by mouth daily.        Allergies  Allergen Reactions  . Baking Soda-Fluoride [Sodium Fluoride] Nausea And Vomiting  . Magnesium Hydroxide Nausea And Vomiting   Follow-up Information    Follow up with Silvano Rusk, MD.   Specialty:  Gastroenterology   Why:  Go to lab in basement his office on Tuesday 6/28 to check blood count   Contact information:   [address] [place] [address] [phone]       Schedule an appointment as soon as possible for a visit with [place].   Why:  for chronic kidney disease and anemia    Contact information:   [place]  [address] [phone]        The results of significant diagnostics from this hospitalization (including imaging, microbiology, ancillary and laboratory) are listed below for reference.    Significant Diagnostic Studies: Dg Chest Port 1 View  04/04/2015   CLINICAL DATA:  Shortness of breath.  EXAM: PORTABLE CHEST - 1 VIEW  COMPARISON:  08/21/2014  FINDINGS: Cardiomegaly with vascular congestion. No confluent airspace opacities, effusions or edema. No acute bony abnormality.  IMPRESSION: Cardiomegaly with vascular congestion.   Electronically Signed   By: Rolm Baptise M.D.   On: 04/04/2015 10:52    Microbiology: No results found for this or any previous visit (from the past 240 hour(s)).   Labs: Basic Metabolic Panel:  Recent Labs Lab  04/04/15 1100 04/04/15 1120 04/05/15 0530  NA 137 138 140  K 4.4 4.4 4.7  CL 105 104 108  CO2 24  --  23  GLUCOSE 270* 273* 96  BUN 36* 35* 39*  CREATININE 2.16* 2.20* 2.16*  CALCIUM 8.9  --  9.0   Liver Function Tests:  Recent Labs Lab 04/04/15 1100  AST 17  ALT 20  ALKPHOS 123  BILITOT 0.7  PROT 6.1*  ALBUMIN 2.8*   No results for input(s): LIPASE, AMYLASE in the last 168 hours. No results for input(s): AMMONIA in the last 168 hours. CBC:  Recent Labs Lab 04/04/15 1100 04/04/15 1120 04/05/15 0530 04/06/15 0449 04/07/15 0620  WBC 4.3  --  5.9 5.1 5.3  NEUTROABS 3.1  --   --   --   --   HGB 7.3* 8.5* 9.4* 8.8* 9.7*  HCT 22.7* 25.0* 29.3* 27.3* 30.2*  MCV 90.8  --  90.4 91.9 91.5  PLT 217  --  233 219 256   Cardiac Enzymes: No results for input(s): CKTOTAL, CKMB, CKMBINDEX, TROPONINI in the last 168 hours. BNP: BNP (last 3 results)  Recent Labs  04/04/15 1100  BNP 1383.6*    ProBNP (last 3 results)  Recent Labs  07/11/14 1855 08/19/14 0838 08/22/14 0740  PROBNP 6841.0* 17664.0* 13748.0*    CBG:  Recent Labs Lab 04/06/15 1149 04/06/15 1633 04/06/15 2122  04/07/15 0733 04/07/15 0830  GLUCAP 193* 195* 187* 66 145*       Signed:  Demaryius Imran L  [facility] 04/07/2015, 9:55 AM

## 2015-04-08 LAB — TYPE AND SCREEN
ABO/RH(D): B POS
Antibody Screen: NEGATIVE
Unit division: 0
Unit division: 0

## 2015-04-10 ENCOUNTER — Other Ambulatory Visit (INDEPENDENT_AMBULATORY_CARE_PROVIDER_SITE_OTHER): Payer: Medicare Other

## 2015-04-10 DIAGNOSIS — D649 Anemia, unspecified: Secondary | ICD-10-CM

## 2015-04-10 LAB — CBC WITH DIFFERENTIAL/PLATELET
BASOS ABS: 0 10*3/uL (ref 0.0–0.1)
Basophils Relative: 0.6 % (ref 0.0–3.0)
EOS ABS: 0.1 10*3/uL (ref 0.0–0.7)
EOS PCT: 1.9 % (ref 0.0–5.0)
HEMATOCRIT: 30.8 % — AB (ref 36.0–46.0)
Hemoglobin: 9.9 g/dL — ABNORMAL LOW (ref 12.0–15.0)
LYMPHS ABS: 1.1 10*3/uL (ref 0.7–4.0)
Lymphocytes Relative: 22 % (ref 12.0–46.0)
MCHC: 32.1 g/dL (ref 30.0–36.0)
MCV: 92.8 fl (ref 78.0–100.0)
MONOS PCT: 7.5 % (ref 3.0–12.0)
Monocytes Absolute: 0.4 10*3/uL (ref 0.1–1.0)
Neutro Abs: 3.3 10*3/uL (ref 1.4–7.7)
Neutrophils Relative %: 68 % (ref 43.0–77.0)
Platelets: 270 10*3/uL (ref 150.0–400.0)
RBC: 3.32 Mil/uL — ABNORMAL LOW (ref 3.87–5.11)
RDW: 19.9 % — AB (ref 11.5–15.5)
WBC: 4.8 10*3/uL (ref 4.0–10.5)

## 2015-04-10 NOTE — Progress Notes (Signed)
Quick Note:  Let her and/or her son no that her hemoglobin is stable at 9.9 I recommend a hematology evaluation for evaluation and treatment of chronic anemia please make that referral if she is willing  Please copy Iona Beard M.D. her primary care provider ______

## 2015-04-11 ENCOUNTER — Other Ambulatory Visit: Payer: Self-pay

## 2015-04-11 DIAGNOSIS — D649 Anemia, unspecified: Secondary | ICD-10-CM

## 2015-04-17 NOTE — Patient Outreach (Signed)
[facility]) Care Management  04/17/2015  [name] [birth year] [id number]   Referral from [place] [facility], Cheryl Dus, RN assigned to outreach.  [name]. [place], [place] Management [place] Assistant Phone: [phone] Fax: [phone]

## 2015-04-24 ENCOUNTER — Telehealth: Payer: Self-pay | Admitting: Hematology

## 2015-04-24 NOTE — Telephone Encounter (Signed)
new patient appt-s/w patient and gave np appt for 07/14 @ 2:30 w/Dr. Irene Limbo Referring Dr. Silvano Rusk Dx- Chronic anemia

## 2015-04-25 ENCOUNTER — Other Ambulatory Visit: Payer: Self-pay | Admitting: Internal Medicine

## 2015-04-26 ENCOUNTER — Other Ambulatory Visit (HOSPITAL_BASED_OUTPATIENT_CLINIC_OR_DEPARTMENT_OTHER): Payer: Medicare Other

## 2015-04-26 ENCOUNTER — Encounter: Payer: Self-pay | Admitting: Hematology

## 2015-04-26 ENCOUNTER — Ambulatory Visit: Payer: Medicare Other

## 2015-04-26 ENCOUNTER — Telehealth: Payer: Self-pay | Admitting: Hematology

## 2015-04-26 ENCOUNTER — Ambulatory Visit (HOSPITAL_BASED_OUTPATIENT_CLINIC_OR_DEPARTMENT_OTHER): Payer: Medicare Other | Admitting: Hematology

## 2015-04-26 VITALS — BP 133/54 | HR 61 | Temp 98.1°F | Resp 17 | Ht 60.0 in | Wt 140.7 lb

## 2015-04-26 DIAGNOSIS — E119 Type 2 diabetes mellitus without complications: Secondary | ICD-10-CM

## 2015-04-26 DIAGNOSIS — D500 Iron deficiency anemia secondary to blood loss (chronic): Secondary | ICD-10-CM

## 2015-04-26 DIAGNOSIS — I10 Essential (primary) hypertension: Secondary | ICD-10-CM | POA: Diagnosis not present

## 2015-04-26 DIAGNOSIS — D649 Anemia, unspecified: Secondary | ICD-10-CM

## 2015-04-26 DIAGNOSIS — N184 Chronic kidney disease, stage 4 (severe): Secondary | ICD-10-CM

## 2015-04-26 DIAGNOSIS — D631 Anemia in chronic kidney disease: Secondary | ICD-10-CM | POA: Diagnosis not present

## 2015-04-26 DIAGNOSIS — N189 Chronic kidney disease, unspecified: Secondary | ICD-10-CM

## 2015-04-26 LAB — CBC & DIFF AND RETIC
BASO%: 0.7 % (ref 0.0–2.0)
Basophils Absolute: 0 10*3/uL (ref 0.0–0.1)
EOS%: 0.4 % (ref 0.0–7.0)
Eosinophils Absolute: 0 10*3/uL (ref 0.0–0.5)
HCT: 36.3 % (ref 34.8–46.6)
HGB: 11.5 g/dL — ABNORMAL LOW (ref 11.6–15.9)
Immature Retic Fract: 1.3 % — ABNORMAL LOW (ref 1.60–10.00)
LYMPH%: 25.8 % (ref 14.0–49.7)
MCH: 29 pg (ref 25.1–34.0)
MCHC: 31.7 g/dL (ref 31.5–36.0)
MCV: 91.7 fL (ref 79.5–101.0)
MONO#: 0.2 10*3/uL (ref 0.1–0.9)
MONO%: 5.3 % (ref 0.0–14.0)
NEUT%: 67.8 % (ref 38.4–76.8)
NEUTROS ABS: 3.1 10*3/uL (ref 1.5–6.5)
Platelets: 168 10*3/uL (ref 145–400)
RBC: 3.96 10*6/uL (ref 3.70–5.45)
RDW: 14.9 % — ABNORMAL HIGH (ref 11.2–14.5)
Retic %: 0.91 % (ref 0.70–2.10)
Retic Ct Abs: 36.04 10*3/uL (ref 33.70–90.70)
WBC: 4.6 10*3/uL (ref 3.9–10.3)
lymph#: 1.2 10*3/uL (ref 0.9–3.3)

## 2015-04-26 LAB — CHCC SMEAR

## 2015-04-26 LAB — COMPREHENSIVE METABOLIC PANEL (CC13)
ALBUMIN: 3.2 g/dL — AB (ref 3.5–5.0)
ALT: 21 U/L (ref 0–55)
AST: 21 U/L (ref 5–34)
Alkaline Phosphatase: 144 U/L (ref 40–150)
Anion Gap: 8 mEq/L (ref 3–11)
BILIRUBIN TOTAL: 0.57 mg/dL (ref 0.20–1.20)
BUN: 25.3 mg/dL (ref 7.0–26.0)
CALCIUM: 9.3 mg/dL (ref 8.4–10.4)
CO2: 26 meq/L (ref 22–29)
CREATININE: 2.2 mg/dL — AB (ref 0.6–1.1)
Chloride: 108 mEq/L (ref 98–109)
EGFR: 24 mL/min/{1.73_m2} — ABNORMAL LOW (ref >90)
Glucose: 256 mg/dl — ABNORMAL HIGH (ref 70–140)
Potassium: 4.6 mEq/L (ref 3.5–5.1)
SODIUM: 143 meq/L (ref 136–145)
Total Protein: 6.9 g/dL (ref 6.4–8.3)

## 2015-04-26 LAB — FERRITIN CHCC: Ferritin: 125 ng/ml (ref 9–269)

## 2015-04-26 NOTE — Patient Instructions (Addendum)
You seem to have Anemia that is related to your GI bleeding and Chronic kidney disease   Plan - IV feraheme (Iron) qweekly x 2 doses -f/u with labs in 1 month -if iron level good and your hgb drops again below 10 we shall consider starting you on hormone shots (Aranesp) to treat your anemia related to your renal disease. -you may stop taking your oral iron since its bothering your stomach -call the clinic if you have any other questions.        Anemia, Nonspecific  Anemia is a condition in which the concentration of red blood cells or hemoglobin in the blood is below normal. Hemoglobin is a substance in red blood cells that carries oxygen to the tissues of the body. Anemia results in not enough oxygen reaching these tissues.  CAUSES  Common causes of anemia include:   Excessive bleeding. Bleeding may be internal or external. This includes excessive bleeding from periods (in women) or from the intestine.   Poor nutrition.   Chronic kidney, thyroid, and liver disease.  Bone marrow disorders that decrease red blood cell production.  Cancer and treatments for cancer.  HIV, AIDS, and their treatments.  Spleen problems that increase red blood cell destruction.  Blood disorders.  Excess destruction of red blood cells due to infection, medicines, and autoimmune disorders. SIGNS AND SYMPTOMS   Minor weakness.   Dizziness.   Headache.  Palpitations.   Shortness of breath, especially with exercise.   Paleness.  Cold sensitivity.  Indigestion.  Nausea.  Difficulty sleeping.  Difficulty concentrating. Symptoms may occur suddenly or they may develop slowly.  DIAGNOSIS  Additional blood tests are often needed. These help your health care provider determine the best treatment. Your health care provider will check your stool for blood and look for other causes of blood loss.  TREATMENT  Treatment varies depending on the cause of the anemia. Treatment can  include:   Supplements of iron, vitamin W38, or folic acid.   Hormone medicines.   A blood transfusion. This may be needed if blood loss is severe.   Hospitalization. This may be needed if there is significant continual blood loss.   Dietary changes.  Spleen removal. HOME CARE INSTRUCTIONS Keep all follow-up appointments. It often takes many weeks to correct anemia, and having your health care provider check on your condition and your response to treatment is very important. SEEK IMMEDIATE MEDICAL CARE IF:   You develop extreme weakness, shortness of breath, or chest pain.   You become dizzy or have trouble concentrating.  You develop heavy vaginal bleeding.   You develop a rash.   You have bloody or black, tarry stools.   You faint.   You vomit up blood.   You vomit repeatedly.   You have abdominal pain.  You have a fever or persistent symptoms for more than 2-3 days.   You have a fever and your symptoms suddenly get worse.   You are dehydrated.  MAKE SURE YOU:  Understand these instructions.  Will watch your condition.  Will get help right away if you are not doing well or get worse. Document Released: 11/06/2004 Document Revised: 06/01/2013 Document Reviewed: 03/25/2013 [facility] Patient Information 2015 [place], [place]. This information is not intended to replace advice given to you by your health care provider. Make sure you discuss any questions you have with your health care provider.

## 2015-04-26 NOTE — Progress Notes (Signed)
Marland Kitchen    CONSULT NOTE  Patient Care Team: Iona Beard, MD as PCP - General (Family Medicine) Thelma Comp, OD (Optometry)  CHIEF COMPLAINTS/PURPOSE OF CONSULTATION:  Evaluation and management of anemia.  HISTORY OF PRESENTING ILLNESS:  [name] is a wonderful 81 year old African-American female with multiple medical comorbidities as noted below who has been referred for evaluation and management of anemia after her recent hospitalization for anemia and GI bleed requiring blood transfusions.  Patient has had issues with needing blood transfusions related to blood loss anemia from intermittent acute GI bleeds and likely has some element of chronic GI bleed presumed to be related to small bowel AVMs (last had small bowel enteroscopy and Argon plasma photocoagulation of jejunal AVMs in October 2015). She was previously on anticoagulation with Coumadin until about 10-12 months ago. This had to be stopped due to her GI bleed issues. She has had extensive workup for GI bleeding issues with Dr. Carlean Purl last few years. Her last bleeding episode led to hospitalization for anemia related to GI bleeding that presented with melena resulting in hospitalization from 04/04/2015 to 04/07/2015. She also has stage IV chronic kidney disease and she'll be seeing nephrology next month. This is likely related to her hypertension, diabetes and potentially to some extent an element of cardiorenal physiology from her significant systolic CHF.  She presents to the clinic today complaining of some mild fatigue but predominantly being her baseline. She notes that she is eating well. Her stool is somewhat dark since she is on ferrous sulfate 1 tablet twice a day or bright red blood per rectum or melena. Her hemoglobin has to 11.5 at this time. Her last ferritin checked on 04/05/2015 was 19 with a steady drop over the last 9 months from 45. There was some thoughts of starting the patient on Aranesp as outpatient for her  anemia chronic disease related to her kidney problem.  Patient reports no other source of blood loss. Reasonable appetite at this time. Lives with her son at this time. Has limitations with speech and balance related to previous CVAs. She has a limited historian but her son does a good job managing her medical history.    MEDICAL HISTORY:  Past Medical History  Diagnosis Date  . Asthma   . CAD (coronary artery disease)     Pt reports MI in 2006 (no documentation).  Cardiolite in 05/2002 and 07/2006 did not reveal any reversible ischemia.  Pt follows with Dr. Rex Kras at [facility].  . CHF (congestive heart failure)     EF 25-30% with dilated LV, mild LVH, severe hypokinesis, and mod-severe reduction in RV function  . Osteoporosis   . HYPERTENSION 08/03/2006  . GASTROPARESIS, DIABETIC 08/03/2006  . HYPERLIPIDEMIA 08/03/2006  . OBSTRUCTIVE SLEEP APNEA 01/06/2008  . PERIPHERAL NEUROPATHY 08/03/2006  . GERD 08/03/2006  . LOW BACK PAIN, CHRONIC 08/03/2006  . OSTEOPOROSIS 03/21/2009  . CEREBRAL EMBOLISM, WITH INFARCTION 07/02/2010  . Angina   . Myocardial infarction "2 or 3"  . Pneumonia 02/26/12    "a few times; probably even today"  . DIABETES MELLITUS, TYPE II 11/04/1983  . Blood transfusion     multiple in 2012, 2015.   . GI bleed 08/2011 and 01/2014  . Chronic daily headache     migraines as well.   . Stroke summer 2011    "made my left hip worse"  . Uterine cancer   . Nonischemic cardiomyopathy 05/2010    Left heart catheterization:2011. Nonobstructive coronary artery disease.  Marland Kitchen  Pulmonary hypertension   . NSTEMI (non-ST elevated myocardial infarction) 01/2014    type II with CVA  . Acute embolic stroke 03/10/4131  . Non-ST elevation MI (NSTEMI) 02/04/2014  . E. coli UTI 03/02/2014  . Renal failure, acute 02/04/2014  . Iron deficiency anemia secondary to blood loss (chronic) 01/06/2008    Qualifier: Diagnosis of  By: Tomasa Hosteller MD, Veronique D.   . Nonsustained ventricular tachycardia   .  Angiodysplasia of intestine with hemorrhage 05/13/2014  . CKD (chronic kidney disease) stage 4, GFR 15-29 ml/min 08/21/2014    SURGICAL HISTORY: Past Surgical History  Procedure Laterality Date  . Esophagogastroduodenoscopy  08/26/2011    Procedure: ESOPHAGOGASTRODUODENOSCOPY (EGD);  Surgeon: [name], MD;  Location: [facility] ENDOSCOPY;  Service: Endoscopy;  Laterality: N/A;  . Colonoscopy  08/28/2011    Procedure: COLONOSCOPY;  Surgeon: [name], MD;  Location: [place];  Service: Endoscopy;  Laterality: N/A;  . Vaginal hysterectomy    . Tubal ligation    . Cataract extraction w/ intraocular lens  implant, bilateral    . Toe surgery      "right big toe; operated on it to straighten it out; it was under"  . Polysomnogram  10/17/2005    AHI-7.28/hr. AHI REM-20.8/hr. Average oxygen saturation range during REM and NREM was 97%. Lowest oxygen saturation during REM sleep was 90%.  . Carotid duplex  05/28/2010    No significant extracranial carotid artery stenosis demonstrated. Vertebrals are patent w/ antegrade flow.  . Cardiac catheterization  05/24/2010    No intervention - recommed medical therapy.  . Cardiovascular stress test  08/07/2006    Moderate-severe defect seen in Basal inferior, Mid inferoseptal, Mid inferior, Mid inferolateral, and Apical inferior regions - consistent w/ infarct/scar. No scintigraphic evidence of inducible myocardial ischemia.  . Transthoracic echocardiogram  08/29/2011    EF 55-60%, moderate LVH,   . Esophagogastroduodenoscopy N/A 02/08/2014    Procedure: ESOPHAGOGASTRODUODENOSCOPY (EGD);  Surgeon: [name], MD;  Location[facility] ENDOSCOPY;  Service: Endoscopy;  Laterality: N/A;  . Colonoscopy N/A 05/12/2014    Procedure: COLONOSCOPY;  Surgeon: [name], MD;  Location: [place];  Service: Endoscopy;  Laterality: N/A;  . Enteroscopy N/A 05/13/2014    Procedure: ENTEROSCOPY;  Surgeon: [name], MD;  Location: [place];  Service: Endoscopy;   Laterality: N/A;  . Enteroscopy N/A 07/13/2014    Procedure: small bowel enteroscopy;  Surgeon: Jerene Bears, MD;  Location: [facility] ENDOSCOPY;  Service: Endoscopy;  Laterality: N/A;  requests slim colonoscope    SOCIAL HISTORY: History   Social History  . Marital Status: Widowed    Spouse Name: N/A  . Number of Children: 7  . Years of Education: N/A   Occupational History  . Disabled    Social History Main Topics  . Smoking status: Never Smoker   . Smokeless tobacco: Former Systems developer    Types: Snuff    Quit date: 10/13/2006     Comment: 02/26/12 "stopped snuff 4-6 years ago"  . Alcohol Use: No     Comment: "stopped drinking alcohol ~ 1980's"  . Drug Use: No  . Sexual Activity: No   Other Topics Concern  . Not on file   Social History Narrative   ** Merged History Encounter **       Lives with daughter    FAMILY HISTORY: Family History  Problem Relation Age of Onset  . Diabetes insipidus Mother   . Hypertension Mother   . Hypertension Father   .  Hypertension Sister   . Hypertension Child   . Stomach cancer Brother     ALLERGIES:  is allergic to baking soda-fluoride and magnesium hydroxide.  MEDICATIONS:  Current Outpatient Prescriptions  Medication Sig Dispense Refill  . acetaminophen (TYLENOL) 500 MG tablet Take 500 mg by mouth every 6 (six) hours as needed for moderate pain or headache.    . albuterol (PROAIR HFA) 108 (90 BASE) MCG/ACT inhaler Inhale 2 puffs into the lungs every 6 (six) hours as needed for wheezing. 8.5 g 11  . amLODipine (NORVASC) 5 MG tablet TAKE 1 TABLET EVERY DAY 30 tablet 11  . atorvastatin (LIPITOR) 40 MG tablet Take 1 tablet (40 mg total) by mouth daily at 6 PM. 30 tablet 11  . ferrous sulfate 325 (65 FE) MG tablet TAKE 1 TABLET (325 MG TOTAL) BY MOUTH 2 (TWO) TIMES DAILY WITH A MEAL. 60 tablet 5  . hydrALAZINE (APRESOLINE) 25 MG tablet Take 0.5 tablets (12.5 mg total) by mouth every 8 (eight) hours. 90 tablet 3  . insulin glargine (LANTUS) 100  UNIT/ML injection Inject 0.05 mLs (5 Units total) into the skin 2 (two) times daily. 10 mL 11  . isosorbide mononitrate (IMDUR) 60 MG 24 hr tablet Take 1 tablet (60 mg total) by mouth daily. 30 tablet 2  . KLOR-CON 10 10 MEQ tablet Take 20 mEq by mouth daily.     Marland Kitchen loratadine (ALLERGY RELIEF) 10 MG tablet Take 10 mg by mouth daily.    . magnesium oxide (MAG-OX) 400 (241.3 MG) MG tablet Take 1 tablet (400 mg total) by mouth 2 (two) times daily. 60 tablet 1  . metoprolol succinate (TOPROL-XL) 25 MG 24 hr tablet TAKE 1 TABLET (25 MG TOTAL) BY MOUTH DAILY. 30 tablet 5  . omeprazole (PRILOSEC) 20 MG capsule Take 1 capsule (20 mg total) by mouth daily before breakfast. 30 capsule 11  . polyethylene glycol (MIRALAX / GLYCOLAX) packet Take 17 g by mouth daily.    Marland Kitchen torsemide (DEMADEX) 10 MG tablet Take 20 mg by mouth daily.      No current facility-administered medications for this visit.    REVIEW OF SYSTEMS:   Constitutional: Denies fevers, chills or abnormal night sweats Eyes: Denies blurriness of vision, double vision or watery eyes Ears, nose, mouth, throat, and face: Denies mucositis or sore throat Respiratory:  chronic dyspnea on exertion Cardiovascular: Denies palpitation, chest discomfort or lower extremity swelling Gastrointestinal:   Constipation nausea and some GI distress from oral iron pills  skin: Denies abnormal skin rashes Lymphatics: Denies new lymphadenopathy or easy bruising Neurological:Denies numbness, tingling or new weaknesses Behavioral/Psych: Mood is stable, no new changes  All other systems were reviewed with the patient and are negative. ROS  PHYSICAL EXAMINATION: ECOG PERFORMANCE STATUS: 3 - Symptomatic, >50% confined to bed  Filed Vitals:   04/26/15 1432  BP: 133/54  Pulse: 61  Temp: 98.1 F (36.7 C)  Resp: 17   Filed Weights   04/26/15 1432  Weight: 140 lb 11.2 oz (63.821 kg)    GENERAL:alert, no distress and comfortable SKIN: skin color, texture,  turgor are normal, no rashes or significant lesions EYES: normal, conjunctiva are pink and non-injected, sclera clear OROPHARYNX:no exudate, no erythema and lips, buccal mucosa, and tongue normal  NECK: supple, thyroid normal size, non-tender, without nodularity LYMPH:  no palpable lymphadenopathy in the cervical, axillary or inguinal LUNGS: clear to auscultation and percussion with normal breathing effort HEART: regular rate & rhythm and no murmurs and no  lower extremity edema ABDOMEN:abdomen soft, non-tender and normal bowel sounds Musculoskeletal:no cyanosis of digits and no clubbing  PSYCH: alert & oriented x 3 with fluent speech NEURO: no focal motor/sensory deficits  LABORATORY DATA:  I have reviewed the data as listed Lab Results  Component Value Date   WBC 4.6 04/26/2015   HGB 11.5* 04/26/2015   HCT 36.3 04/26/2015   MCV 91.7 04/26/2015   PLT 168 04/26/2015    Recent Labs  05/16/14 0755  07/12/14 0146  08/22/14 0740 09/01/14 1549 04/04/15 1100 04/04/15 1120 04/05/15 0530  NA 140  < > 140  < > 138 138 137 138 140  K 4.9  < > 4.1  < > 4.3 4.3 4.4 4.4 4.7  CL 108  < > 106  < > 103 105 105 104 108  CO2 18*  < > 23  < > 23 26 24   --  23  GLUCOSE 76  < > 192*  < > 138* 196* 270* 273* 96  BUN 20  < > 47*  < > 31* 29* 36* 35* 39*  CREATININE 1.53*  < > 1.83*  < > 1.80* 1.77* 2.16* 2.20* 2.16*  CALCIUM 8.5  < > 8.9  < > 8.7 8.5 8.9  --  9.0  GFRNONAA 31*  < > 25*  < > 26*  --  21*  --  21*  GFRAA 36*  < > 29*  < > 30*  --  24*  --  24*  PROT 6.3  --  6.4  --   --   --  6.1*  --   --   ALBUMIN 2.1*  --  2.8*  --   --   --  2.8*  --   --   AST 19  --  9  --   --   --  17  --   --   ALT 42*  --  12  --   --   --  20  --   --   ALKPHOS 110  --  83  --   --   --  123  --   --   BILITOT 0.8  --  <0.2*  --   --   --  0.7  --   --   < > = values in this interval not displayed.  RADIOGRAPHIC STUDIES: I have personally reviewed the radiological images as listed and agreed with  the findings in the report. Dg Chest Port 1 View  04/04/2015   CLINICAL DATA:  Shortness of breath.  EXAM: PORTABLE CHEST - 1 VIEW  COMPARISON:  08/21/2014  FINDINGS: Cardiomegaly with vascular congestion. No confluent airspace opacities, effusions or edema. No acute bony abnormality.  IMPRESSION: Cardiomegaly with vascular congestion.   Electronically Signed   By: Rolm Baptise M.D.   On: 04/04/2015 10:52    ASSESSMENT & PLAN:   [name] is a very pleasant 81 year old female with multiple comorbidities with  1) Normocytic Normochromic anemia which is likely multifactorial. A)The predominant factors seem to be iron deficiency from GI blood losses with intermittent acute GI bleeding and likely chronic GI blood losses. Ferritin level were 19 which is a progressive drop previous measurements. That is an anticipation that the patient will continue to lose blood from the GI tract even though she has been on blood thinners for her A. Fib. b) she also has stage IV chronic kidney disease which is causing an element of anemia of  chronic disease related to erythropoietin deficiency. c) cannot rule out myelodysplastic syndrome. Plan -The patient's hemoglobin today appears stable at 11.5 after having recently been transfused last month. No overt clinical symptoms of acute bleeding at this time. -Would aggressively replace her iron IV considering GI upset with oral iron, likely poor absorption while on chronic PPI therapy and anticipated significant iron losses with chronic GI losses that cannot be controlled.  -Given the patient has chronic kidney disease her ferritin goals would likely be in the 100-200 range prior to consideration of any ESA therapy. -Given her cardiac co-morbidities and h/o CVA would try to delay ESA treatments if possible unless her hemoglobin continues to drop below 10 even with adequate iron reserves. If that occurs would consider starting the patient on Aranesp if ferritin  >100. -Obtained patient's consent for IV Feraheme every weekly 2 doses to get about 1 g of IV iron . -Patient might stop her oral ferrous sulfate if it bothers her stomach too much as if the case currently. -Follow-up with nephrology for management of her chronic kidney disease .  I appreciate the privilege of being able to care for this wonderful person.  All questions were answered. The patient knows to call the clinic with any problems, questions or concerns. I spent 40 minutes counseling the patient face to face. The total time spent in the appointment was 60 minutes and more than 50% was on counseling.     Sullivan Lone, MD 04/26/2015 2:46 PM

## 2015-04-26 NOTE — Progress Notes (Signed)
Checked in new pt with no financial concerns prior to seeing the dr.  Pt has 2 insurances so financial assistance may not be needed.  ° °

## 2015-04-26 NOTE — Telephone Encounter (Signed)
Hematology is Rxing IV feraheme so should not need oral iron

## 2015-04-26 NOTE — Telephone Encounter (Signed)
Gave adn printed appt sched and avs fo rpt for July and Aug  °

## 2015-04-26 NOTE — Telephone Encounter (Signed)
Advise regarding refill Sir, thank you.

## 2015-04-27 LAB — SEDIMENTATION RATE: Sed Rate: 5 mm/hr (ref 0–30)

## 2015-04-30 LAB — SPEP & IFE WITH QIG
Albumin ELP: 3.4 g/dL — ABNORMAL LOW (ref 3.8–4.8)
Alpha-1-Globulin: 0.3 g/dL (ref 0.2–0.3)
Alpha-2-Globulin: 0.6 g/dL (ref 0.5–0.9)
BETA GLOBULIN: 0.4 g/dL (ref 0.4–0.6)
Beta 2: 0.5 g/dL (ref 0.2–0.5)
Gamma Globulin: 1.5 g/dL (ref 0.8–1.7)
IGA: 390 mg/dL — AB (ref 69–380)
IGM, SERUM: 20 mg/dL — AB (ref 52–322)
IgG (Immunoglobin G), Serum: 1650 mg/dL (ref 690–1700)
TOTAL PROTEIN, SERUM ELECTROPHOR: 6.7 g/dL (ref 6.1–8.1)

## 2015-05-04 ENCOUNTER — Ambulatory Visit (HOSPITAL_BASED_OUTPATIENT_CLINIC_OR_DEPARTMENT_OTHER): Payer: Medicare Other

## 2015-05-04 VITALS — BP 126/49 | HR 55 | Temp 98.2°F | Resp 16

## 2015-05-04 DIAGNOSIS — K922 Gastrointestinal hemorrhage, unspecified: Secondary | ICD-10-CM | POA: Diagnosis not present

## 2015-05-04 DIAGNOSIS — D631 Anemia in chronic kidney disease: Secondary | ICD-10-CM

## 2015-05-04 DIAGNOSIS — D500 Iron deficiency anemia secondary to blood loss (chronic): Secondary | ICD-10-CM | POA: Diagnosis not present

## 2015-05-04 DIAGNOSIS — N184 Chronic kidney disease, stage 4 (severe): Secondary | ICD-10-CM | POA: Diagnosis not present

## 2015-05-04 MED ORDER — SODIUM CHLORIDE 0.9 % IV SOLN
Freq: Once | INTRAVENOUS | Status: AC
  Administered 2015-05-04: 11:00:00 via INTRAVENOUS

## 2015-05-04 MED ORDER — ACETAMINOPHEN 325 MG PO TABS
650.0000 mg | ORAL_TABLET | Freq: Once | ORAL | Status: AC
  Administered 2015-05-04: 650 mg via ORAL

## 2015-05-04 MED ORDER — METHYLPREDNISOLONE SODIUM SUCC 40 MG IJ SOLR
INTRAMUSCULAR | Status: AC
  Filled 2015-05-04: qty 1

## 2015-05-04 MED ORDER — ACETAMINOPHEN 325 MG PO TABS
ORAL_TABLET | ORAL | Status: AC
  Filled 2015-05-04: qty 2

## 2015-05-04 MED ORDER — METHYLPREDNISOLONE SODIUM SUCC 40 MG IJ SOLR
60.0000 mg | INTRAMUSCULAR | Status: AC | PRN
  Administered 2015-05-04: 60 mg via INTRAVENOUS

## 2015-05-04 MED ORDER — SODIUM CHLORIDE 0.9 % IV SOLN
510.0000 mg | Freq: Once | INTRAVENOUS | Status: AC
  Administered 2015-05-04: 510 mg via INTRAVENOUS
  Filled 2015-05-04: qty 17

## 2015-05-04 NOTE — Patient Instructions (Signed)

## 2015-05-10 ENCOUNTER — Other Ambulatory Visit: Payer: Self-pay | Admitting: *Deleted

## 2015-05-10 NOTE — Patient Outreach (Signed)
[facility]) Care Management  05/10/2015  [name] 06-24-1933 [id number]   RN CM attempted outreach call to patient to discuss services of [facility]. Patient was unavailable. HIPPA compliance voicemail message was left with return callback number. Return call immediately from son . Per son he is very interested in this service for his mother. He is unable to go through the information because a serviceman is there. Per son Ronalee Belts please call back next week. He is the care giver of his mom. Per Ronalee Belts he lives with his mother and fills her medicine bottles and takes care of her. Per Ronalee Belts the patient does not have a healthcare power of attorney. It was given by his mom Dr but they have not gotten it filled out at this time. Per son Ronalee Belts he is agreeable for outreach call back from [employer].  [facility] [phone]

## 2015-05-11 ENCOUNTER — Ambulatory Visit (HOSPITAL_BASED_OUTPATIENT_CLINIC_OR_DEPARTMENT_OTHER): Payer: Medicare Other

## 2015-05-11 VITALS — BP 123/55 | HR 54 | Temp 98.0°F | Resp 18

## 2015-05-11 DIAGNOSIS — D500 Iron deficiency anemia secondary to blood loss (chronic): Secondary | ICD-10-CM

## 2015-05-11 DIAGNOSIS — D649 Anemia, unspecified: Secondary | ICD-10-CM

## 2015-05-11 DIAGNOSIS — N184 Chronic kidney disease, stage 4 (severe): Secondary | ICD-10-CM

## 2015-05-11 DIAGNOSIS — D631 Anemia in chronic kidney disease: Secondary | ICD-10-CM

## 2015-05-11 MED ORDER — SODIUM CHLORIDE 0.9 % IV SOLN
Freq: Once | INTRAVENOUS | Status: AC
  Administered 2015-05-11: 11:00:00 via INTRAVENOUS

## 2015-05-11 MED ORDER — METHYLPREDNISOLONE SODIUM SUCC 40 MG IJ SOLR
60.0000 mg | INTRAMUSCULAR | Status: AC | PRN
  Administered 2015-05-11: 60 mg via INTRAVENOUS

## 2015-05-11 MED ORDER — SODIUM CHLORIDE 0.9 % IV SOLN
510.0000 mg | Freq: Once | INTRAVENOUS | Status: AC
  Administered 2015-05-11: 510 mg via INTRAVENOUS
  Filled 2015-05-11: qty 17

## 2015-05-11 MED ORDER — ACETAMINOPHEN 325 MG PO TABS
650.0000 mg | ORAL_TABLET | Freq: Once | ORAL | Status: AC
  Administered 2015-05-11: 650 mg via ORAL

## 2015-05-11 MED ORDER — METHYLPREDNISOLONE SODIUM SUCC 40 MG IJ SOLR
INTRAMUSCULAR | Status: AC
  Filled 2015-05-11: qty 2

## 2015-05-11 MED ORDER — ACETAMINOPHEN 325 MG PO TABS
ORAL_TABLET | ORAL | Status: AC
  Filled 2015-05-11: qty 2

## 2015-05-11 NOTE — Patient Instructions (Signed)

## 2015-05-22 ENCOUNTER — Other Ambulatory Visit: Payer: Self-pay | Admitting: *Deleted

## 2015-05-22 NOTE — Patient Outreach (Signed)
[facility]) Care Management  05/22/2015  BRONWEN PENDERGRAFT [birth year] [id number]   RN CM attempted Telephone outreach call to patient son [name]. [name] was unavailable. Unable to leave message.    [facility] [phone]

## 2015-05-23 ENCOUNTER — Other Ambulatory Visit: Payer: Self-pay | Admitting: *Deleted

## 2015-05-23 NOTE — Patient Outreach (Signed)
[facility]) Care Management  05/23/2015  [name] 30-Jul-1933 [id number]   RN CM attempted Telephone outreach call to patient.  Patient was unavailable. HIPPA compliance voicemail message was left with return callback number.    [facility] [phone]

## 2015-05-24 ENCOUNTER — Telehealth: Payer: Self-pay | Admitting: Hematology

## 2015-05-24 ENCOUNTER — Ambulatory Visit (HOSPITAL_BASED_OUTPATIENT_CLINIC_OR_DEPARTMENT_OTHER): Payer: Medicare Other

## 2015-05-24 ENCOUNTER — Encounter: Payer: Self-pay | Admitting: Hematology

## 2015-05-24 ENCOUNTER — Encounter: Payer: Self-pay | Admitting: *Deleted

## 2015-05-24 ENCOUNTER — Ambulatory Visit (HOSPITAL_BASED_OUTPATIENT_CLINIC_OR_DEPARTMENT_OTHER): Payer: Medicare Other | Admitting: Hematology

## 2015-05-24 VITALS — BP 138/53 | HR 62 | Temp 98.4°F | Resp 17 | Ht 60.0 in

## 2015-05-24 DIAGNOSIS — D500 Iron deficiency anemia secondary to blood loss (chronic): Secondary | ICD-10-CM | POA: Diagnosis not present

## 2015-05-24 DIAGNOSIS — K922 Gastrointestinal hemorrhage, unspecified: Secondary | ICD-10-CM | POA: Diagnosis not present

## 2015-05-24 DIAGNOSIS — N189 Chronic kidney disease, unspecified: Secondary | ICD-10-CM | POA: Diagnosis not present

## 2015-05-24 DIAGNOSIS — D631 Anemia in chronic kidney disease: Secondary | ICD-10-CM | POA: Diagnosis not present

## 2015-05-24 DIAGNOSIS — D649 Anemia, unspecified: Secondary | ICD-10-CM | POA: Diagnosis present

## 2015-05-24 DIAGNOSIS — D539 Nutritional anemia, unspecified: Secondary | ICD-10-CM | POA: Insufficient documentation

## 2015-05-24 DIAGNOSIS — I4891 Unspecified atrial fibrillation: Secondary | ICD-10-CM

## 2015-05-24 DIAGNOSIS — D538 Other specified nutritional anemias: Secondary | ICD-10-CM

## 2015-05-24 LAB — BASIC METABOLIC PANEL (CC13)
Anion Gap: 6 mEq/L (ref 3–11)
BUN: 28.7 mg/dL — AB (ref 7.0–26.0)
CALCIUM: 9.1 mg/dL (ref 8.4–10.4)
CO2: 27 mEq/L (ref 22–29)
Chloride: 105 mEq/L (ref 98–109)
Creatinine: 1.9 mg/dL — ABNORMAL HIGH (ref 0.6–1.1)
EGFR: 28 mL/min/{1.73_m2} — ABNORMAL LOW (ref >90)
Glucose: 440 mg/dl — ABNORMAL HIGH (ref 70–140)
Potassium: 4.3 mEq/L (ref 3.5–5.1)
Sodium: 138 mEq/L (ref 136–145)

## 2015-05-24 LAB — CBC WITH DIFFERENTIAL/PLATELET
BASO%: 0.9 % (ref 0.0–2.0)
Basophils Absolute: 0 10*3/uL (ref 0.0–0.1)
EOS ABS: 0 10*3/uL (ref 0.0–0.5)
EOS%: 0.7 % (ref 0.0–7.0)
HCT: 36.4 % (ref 34.8–46.6)
HGB: 11.8 g/dL (ref 11.6–15.9)
LYMPH%: 15.1 % (ref 14.0–49.7)
MCH: 29.1 pg (ref 25.1–34.0)
MCHC: 32.3 g/dL (ref 31.5–36.0)
MCV: 90.2 fL (ref 79.5–101.0)
MONO#: 0.4 10*3/uL (ref 0.1–0.9)
MONO%: 7.9 % (ref 0.0–14.0)
NEUT#: 3.9 10*3/uL (ref 1.5–6.5)
NEUT%: 75.4 % (ref 38.4–76.8)
PLATELETS: 151 10*3/uL (ref 145–400)
RBC: 4.04 10*6/uL (ref 3.70–5.45)
RDW: 15.4 % — ABNORMAL HIGH (ref 11.2–14.5)
WBC: 5.2 10*3/uL (ref 3.9–10.3)
lymph#: 0.8 10*3/uL — ABNORMAL LOW (ref 0.9–3.3)

## 2015-05-24 LAB — FERRITIN CHCC: FERRITIN: 900 ng/mL — AB (ref 9–269)

## 2015-05-24 LAB — VITAMIN B12: VITAMIN B 12: 893 pg/mL (ref 211–911)

## 2015-05-24 NOTE — Progress Notes (Signed)
.    Hematology oncology clinic note  Date of service: 05/24/2015  Patient Care Team: Iona Beard, MD as PCP - General (Family Medicine) Thelma Comp, OD (Optometry) Verlin Grills, RN as [facility]  CHIEF COMPLAINTS/PURPOSE OF CONSULTATION: Follow-up for multifactorial anemia  HPI -Please see my previous note from 04/26/2015 for details of initial presentation.  INTERVAL HISTORY  [name] is here for follow-up regarding the further management of her anemia. She received 2 doses of IV Feraheme and tolerated it very well. She notes that she is feeling much better overall.  Denies any chest pain/shortness of  Breath /dizziness.  Did have a fall several weeks ago which she notes was due to loss of balance. . No significant injuries.  Denies presence of any melena , hematemesis or  Rectal bleeding.  No other acute new concerns. Here with her daughter for the clinic visit.  MEDICAL HISTORY:  Past Medical History  Diagnosis Date  . Asthma   . CAD (coronary artery disease)     Pt reports MI in 2006 (no documentation).  Cardiolite in 05/2002 and 07/2006 did not reveal any reversible ischemia.  Pt follows with Dr. Rex Kras at [facility].  . CHF (congestive heart failure)     EF 25-30% with dilated LV, mild LVH, severe hypokinesis, and mod-severe reduction in RV function  . Osteoporosis   . HYPERTENSION 08/03/2006  . GASTROPARESIS, DIABETIC 08/03/2006  . HYPERLIPIDEMIA 08/03/2006  . OBSTRUCTIVE SLEEP APNEA 01/06/2008  . PERIPHERAL NEUROPATHY 08/03/2006  . GERD 08/03/2006  . LOW BACK PAIN, CHRONIC 08/03/2006  . OSTEOPOROSIS 03/21/2009  . CEREBRAL EMBOLISM, WITH INFARCTION 07/02/2010  . Angina   . Myocardial infarction "2 or 3"  . Pneumonia 02/26/12    "a few times; probably even today"  . DIABETES MELLITUS, TYPE II 11/04/1983  . Blood transfusion     multiple in 2012, 2015.   . GI bleed 08/2011 and 01/2014  . Chronic daily headache     migraines as well.   .  Stroke summer 2011    "made my left hip worse"  . Uterine cancer   . Nonischemic cardiomyopathy 05/2010    Left heart catheterization:2011. Nonobstructive coronary artery disease.  . Pulmonary hypertension   . NSTEMI (non-ST elevated myocardial infarction) 01/2014    type II with CVA  . Acute embolic stroke 5/57/3220  . Non-ST elevation MI (NSTEMI) 02/04/2014  . E. coli UTI 03/02/2014  . Renal failure, acute 02/04/2014  . Iron deficiency anemia secondary to blood loss (chronic) 01/06/2008    Qualifier: Diagnosis of  By: Tomasa Hosteller MD, Veronique D.   . Nonsustained ventricular tachycardia   . Angiodysplasia of intestine with hemorrhage 05/13/2014  . CKD (chronic kidney disease) stage 4, GFR 15-29 ml/min 08/21/2014    SURGICAL HISTORY: Past Surgical History  Procedure Laterality Date  . Esophagogastroduodenoscopy  08/26/2011    Procedure: ESOPHAGOGASTRODUODENOSCOPY (EGD);  Surgeon: [name], MD;  Location: [facility] ENDOSCOPY;  Service: Endoscopy;  Laterality: N/A;  . Colonoscopy  08/28/2011    Procedure: COLONOSCOPY;  Surgeon: [name], MD;  Location: [place];  Service: Endoscopy;  Laterality: N/A;  . Vaginal hysterectomy    . Tubal ligation    . Cataract extraction w/ intraocular lens  implant, bilateral    . Toe surgery      "right big toe; operated on it to straighten it out; it was under"  . Polysomnogram  10/17/2005    AHI-7.28/hr. AHI REM-20.8/hr. Average oxygen  saturation range during REM and NREM was 97%. Lowest oxygen saturation during REM sleep was 90%.  . Carotid duplex  05/28/2010    No significant extracranial carotid artery stenosis demonstrated. Vertebrals are patent w/ antegrade flow.  . Cardiac catheterization  05/24/2010    No intervention - recommed medical therapy.  . Cardiovascular stress test  08/07/2006    Moderate-severe defect seen in Basal inferior, Mid inferoseptal, Mid inferior, Mid inferolateral, and Apical inferior regions - consistent w/ infarct/scar. No  scintigraphic evidence of inducible myocardial ischemia.  . Transthoracic echocardiogram  08/29/2011    EF 55-60%, moderate LVH,   . Esophagogastroduodenoscopy N/A 02/08/2014    Procedure: ESOPHAGOGASTRODUODENOSCOPY (EGD);  Surgeon: [name], MD;  Location: [facility] ENDOSCOPY;  Service: Endoscopy;  Laterality: N/A;  . Colonoscopy N/A 05/12/2014    Procedure: COLONOSCOPY;  Surgeon: [name], MD;  Location: [place];  Service: Endoscopy;  Laterality: N/A;  . Enteroscopy N/A 05/13/2014    Procedure: ENTEROSCOPY;  Surgeon: [name], MD;  Location: [place];  Service: Endoscopy;  Laterality: N/A;  . Enteroscopy N/A 07/13/2014    Procedure: small bowel enteroscopy;  Surgeon: Jerene Bears, MD;  Location: [facility] ENDOSCOPY;  Service: Endoscopy;  Laterality: N/A;  requests slim colonoscope    SOCIAL HISTORY: Social History   Social History  . Marital Status: Widowed    Spouse Name: N/A  . Number of Children: 7  . Years of Education: N/A   Occupational History  . Disabled    Social History Main Topics  . Smoking status: Never Smoker   . Smokeless tobacco: Former Systems developer    Types: Snuff    Quit date: 10/13/2006     Comment: 02/26/12 "stopped snuff 4-6 years ago"  . Alcohol Use: No     Comment: "stopped drinking alcohol ~ 1980's"  . Drug Use: No  . Sexual Activity: No   Other Topics Concern  . Not on file   Social History Narrative   ** Merged History Encounter **       Lives with daughter    FAMILY HISTORY: Family History  Problem Relation Age of Onset  . Diabetes insipidus Mother   . Hypertension Mother   . Hypertension Father   . Hypertension Sister   . Hypertension Child   . Stomach cancer Brother     ALLERGIES:  is allergic to baking soda-fluoride and magnesium hydroxide.  MEDICATIONS:  Current Outpatient Prescriptions  Medication Sig Dispense Refill  . acetaminophen (TYLENOL) 500 MG tablet Take 500 mg by mouth every 6 (six) hours as needed for moderate  pain or headache.    . albuterol (PROAIR HFA) 108 (90 BASE) MCG/ACT inhaler Inhale 2 puffs into the lungs every 6 (six) hours as needed for wheezing. 8.5 g 11  . amLODipine (NORVASC) 5 MG tablet TAKE 1 TABLET EVERY DAY 30 tablet 11  . aspirin 81 MG tablet Take 81 mg by mouth daily.    Marland Kitchen atorvastatin (LIPITOR) 40 MG tablet Take 1 tablet (40 mg total) by mouth daily at 6 PM. 30 tablet 11  . ferrous sulfate 325 (65 FE) MG tablet TAKE 1 TABLET (325 MG TOTAL) BY MOUTH 2 (TWO) TIMES DAILY WITH A MEAL. 60 tablet 5  . hydrALAZINE (APRESOLINE) 25 MG tablet Take 0.5 tablets (12.5 mg total) by mouth every 8 (eight) hours. 90 tablet 3  . insulin glargine (LANTUS) 100 UNIT/ML injection Inject 0.05 mLs (5 Units total) into the skin 2 (two) times daily. 10 mL  11  . isosorbide mononitrate (IMDUR) 60 MG 24 hr tablet Take 1 tablet (60 mg total) by mouth daily. 30 tablet 2  . KLOR-CON 10 10 MEQ tablet Take 20 mEq by mouth daily.     Marland Kitchen loratadine (ALLERGY RELIEF) 10 MG tablet Take 10 mg by mouth daily.    . magnesium oxide (MAG-OX) 400 (241.3 MG) MG tablet Take 1 tablet (400 mg total) by mouth 2 (two) times daily. 60 tablet 1  . metoprolol succinate (TOPROL-XL) 25 MG 24 hr tablet TAKE 1 TABLET (25 MG TOTAL) BY MOUTH DAILY. 30 tablet 5  . omeprazole (PRILOSEC) 20 MG capsule Take 1 capsule (20 mg total) by mouth daily before breakfast. 30 capsule 11  . polyethylene glycol (MIRALAX / GLYCOLAX) packet Take 17 g by mouth daily.    Marland Kitchen torsemide (DEMADEX) 10 MG tablet Take 20 mg by mouth daily.      No current facility-administered medications for this visit.    REVIEW OF SYSTEMS:    10 point review of systems was done and pertinent positives were noted in interval history. Review of Systems  All other systems reviewed and are negative.   PHYSICAL EXAMINATION: ECOG PERFORMANCE STATUS: 3 - Symptomatic, >50% confined to bed  Filed Vitals:   05/24/15 0946  BP: 138/53  Pulse: 62  Temp: 98.4 F (36.9 C)  Resp: 17     Filed Weights    GENERAL:alert, no distress and comfortable SKIN: skin color, texture, turgor are normal, no rashes or significant lesions EYES: normal, conjunctiva are pink and non-injected, sclera clear OROPHARYNX:no exudate, no erythema and lips, buccal mucosa, and tongue normal  NECK: supple, thyroid normal size, non-tender, without nodularity LYMPH:  no palpable lymphadenopathy in the cervical, axillary or inguinal LUNGS: clear to auscultation and percussion with normal breathing effort HEART: irregular ABDOMEN:abdomen soft, non-tender and normal bowel sounds Musculoskeletal:no cyanosis of digits and no clubbing  PSYCH: alert & oriented x 3 with fluent speech NEURO: no focal motor/sensory deficits  LABORATORY DATA:   . CBC Latest Ref Rng 05/24/2015 04/26/2015 04/10/2015  WBC 3.9 - 10.3 10e3/uL 5.2 4.6 4.8  Hemoglobin 11.6 - 15.9 g/dL 11.8 11.5(L) 9.9(L)  Hematocrit 34.8 - 46.6 % 36.4 36.3 30.8(L)  Platelets 145 - 400 10e3/uL 151 168 270.0      Recent Labs  07/12/14 0146  08/22/14 0740  04/04/15 1100 04/04/15 1120 04/05/15 0530 04/26/15 1356 05/24/15 1015  NA 140  < > 138  < > 137 138 140 143 138  K 4.1  < > 4.3  < > 4.4 4.4 4.7 4.6 4.3  CL 106  < > 103  < > 105 104 108  --   --   CO2 23  < > 23  < > 24  --  23 26 27   GLUCOSE 192*  < > 138*  < > 270* 273* 96 256* 440*  BUN 47*  < > 31*  < > 36* 35* 39* 25.3 28.7*  CREATININE 1.83*  < > 1.80*  < > 2.16* 2.20* 2.16* 2.2* 1.9*  CALCIUM 8.9  < > 8.7  < > 8.9  --  9.0 9.3 9.1  GFRNONAA 25*  < > 26*  --  21*  --  21*  --   --   GFRAA 29*  < > 30*  --  24*  --  24*  --   --   PROT 6.4  --   --   --  6.1*  --   --  6.9  --   ALBUMIN 2.8*  --   --   --  2.8*  --   --  3.2*  --   AST 9  --   --   --  17  --   --  21  --   ALT 12  --   --   --  20  --   --  21  --   ALKPHOS 83  --   --   --  123  --   --  144  --   BILITOT <0.2*  --   --   --  0.7  --   --  0.57  --   < > = values in this interval not  displayed.  RADIOGRAPHIC STUDIES: I have personally reviewed the radiological images as listed and agreed with the findings in the report. No results found.  ASSESSMENT & PLAN:   [name] is a very pleasant 81 year old female with multiple comorbidities with  1) Normocytic Normochromic anemia which is likely multifactorial. A)The predominant factors seem to be iron deficiency from GI blood losses with intermittent acute GI bleeding and likely chronic GI blood losses. Ferritin level were 19 which is a progressive drop previous measurements. That is an anticipation that the patient will continue to lose blood from the GI tract even though she has been on blood thinners for her A. Fib. b) she also has stage IV chronic kidney disease which is causing an element of anemia of chronic disease related to erythropoietin deficiency. c) cannot rule out myelodysplastic syndrome.  Patient's hemoglobin is stable at 11.8. No further overt GI bleeding. She subjectively feels much better after the IV iron. Tolerated IV iron very well. Ferritin levels appropriately elevated. Plan -no indication for further IV iron therapy at this time -Continue to monitor for GI bleeding -Her hemoglobin as near normal and she has no indication for erythropoiesis stimulating agents at this time. -Given her cardiac co-morbidities and h/o CVA would try to delay ESA treatments if possible unless her hemoglobin continues to drop below 10 even with adequate iron reserves. If that occurs would consider starting the patient on Aranesp if ferritin >100.-Follow-up with nephrology for management of her chronic kidney disease .  Return to clinic with Dr. Irene Limbo with repeat CBC, CMP in 2 months. Earlier if any other acute new questions or concerns arise.  I appreciate the privilege of being able to care for this wonderful person.  All questions were answered. The patient knows to call the clinic with any problems, questions or  concerns. I spent 15 minutes counseling the patient face to face. The total time spent in the appointment was 25 minutes and more than 50% was on counseling.   Sullivan Lone MD [place] [place] [facility] Phys[facility]  (Office):       [phone] (Work cell):  [phone] (Fax):           [phone]

## 2015-05-24 NOTE — Progress Notes (Signed)
Correction enters due to dragon transcription software  -Patient is off Coumadin for her A. Fib.

## 2015-05-24 NOTE — Telephone Encounter (Signed)
Pt confirmed labs/ov per 08/11 POF, gave pt avs and calendar.... KJ °

## 2015-06-02 ENCOUNTER — Encounter (HOSPITAL_COMMUNITY): Payer: Self-pay

## 2015-06-02 ENCOUNTER — Emergency Department (HOSPITAL_COMMUNITY): Payer: Medicare Other

## 2015-06-02 ENCOUNTER — Emergency Department (HOSPITAL_COMMUNITY)
Admission: EM | Admit: 2015-06-02 | Discharge: 2015-06-03 | Disposition: A | Discharge status: Home / Self Care | Payer: Medicare Other | Attending: Emergency Medicine | Admitting: Emergency Medicine

## 2015-06-02 DIAGNOSIS — Z9889 Other specified postprocedural states: Secondary | ICD-10-CM | POA: Insufficient documentation

## 2015-06-02 DIAGNOSIS — J45909 Unspecified asthma, uncomplicated: Secondary | ICD-10-CM | POA: Diagnosis not present

## 2015-06-02 DIAGNOSIS — I252 Old myocardial infarction: Secondary | ICD-10-CM | POA: Insufficient documentation

## 2015-06-02 DIAGNOSIS — S8992XA Unspecified injury of left lower leg, initial encounter: Secondary | ICD-10-CM | POA: Diagnosis not present

## 2015-06-02 DIAGNOSIS — I509 Heart failure, unspecified: Secondary | ICD-10-CM | POA: Insufficient documentation

## 2015-06-02 DIAGNOSIS — Z8719 Personal history of other diseases of the digestive system: Secondary | ICD-10-CM | POA: Insufficient documentation

## 2015-06-02 DIAGNOSIS — N184 Chronic kidney disease, stage 4 (severe): Secondary | ICD-10-CM | POA: Insufficient documentation

## 2015-06-02 DIAGNOSIS — I129 Hypertensive chronic kidney disease with stage 1 through stage 4 chronic kidney disease, or unspecified chronic kidney disease: Secondary | ICD-10-CM | POA: Diagnosis not present

## 2015-06-02 DIAGNOSIS — Y999 Unspecified external cause status: Secondary | ICD-10-CM | POA: Diagnosis not present

## 2015-06-02 DIAGNOSIS — M79606 Pain in leg, unspecified: Secondary | ICD-10-CM

## 2015-06-02 DIAGNOSIS — G8929 Other chronic pain: Secondary | ICD-10-CM | POA: Insufficient documentation

## 2015-06-02 DIAGNOSIS — Z8701 Personal history of pneumonia (recurrent): Secondary | ICD-10-CM | POA: Insufficient documentation

## 2015-06-02 DIAGNOSIS — W01198A Fall on same level from slipping, tripping and stumbling with subsequent striking against other object, initial encounter: Secondary | ICD-10-CM | POA: Diagnosis not present

## 2015-06-02 DIAGNOSIS — I2510 Atherosclerotic heart disease of native coronary artery without angina pectoris: Secondary | ICD-10-CM | POA: Diagnosis not present

## 2015-06-02 DIAGNOSIS — E119 Type 2 diabetes mellitus without complications: Secondary | ICD-10-CM | POA: Diagnosis not present

## 2015-06-02 DIAGNOSIS — Z8739 Personal history of other diseases of the musculoskeletal system and connective tissue: Secondary | ICD-10-CM | POA: Diagnosis not present

## 2015-06-02 DIAGNOSIS — S72112A Displaced fracture of greater trochanter of left femur, initial encounter for closed fracture: Secondary | ICD-10-CM | POA: Insufficient documentation

## 2015-06-02 DIAGNOSIS — Z8541 Personal history of malignant neoplasm of cervix uteri: Secondary | ICD-10-CM | POA: Diagnosis not present

## 2015-06-02 DIAGNOSIS — Y92009 Unspecified place in unspecified non-institutional (private) residence as the place of occurrence of the external cause: Secondary | ICD-10-CM | POA: Diagnosis not present

## 2015-06-02 DIAGNOSIS — Z8619 Personal history of other infectious and parasitic diseases: Secondary | ICD-10-CM | POA: Insufficient documentation

## 2015-06-02 DIAGNOSIS — S79912A Unspecified injury of left hip, initial encounter: Secondary | ICD-10-CM | POA: Diagnosis present

## 2015-06-02 DIAGNOSIS — Z8673 Personal history of transient ischemic attack (TIA), and cerebral infarction without residual deficits: Secondary | ICD-10-CM | POA: Insufficient documentation

## 2015-06-02 DIAGNOSIS — Y9389 Activity, other specified: Secondary | ICD-10-CM | POA: Insufficient documentation

## 2015-06-02 DIAGNOSIS — Z862 Personal history of diseases of the blood and blood-forming organs and certain disorders involving the immune mechanism: Secondary | ICD-10-CM | POA: Insufficient documentation

## 2015-06-02 MED ORDER — HYDROCODONE-ACETAMINOPHEN 5-325 MG PO TABS
1.0000 | ORAL_TABLET | Freq: Once | ORAL | Status: AC
  Administered 2015-06-02: 1 via ORAL
  Filled 2015-06-02: qty 1

## 2015-06-02 NOTE — ED Provider Notes (Signed)
History   Chief Complaint  Patient presents with  . Fall  . Hip Pain    HPI 81 year old female with past history as below notable for osteoporosis, debility, uses a walker at baseline presents ED after having a fall at home couple days ago. She is coming by her daughters today who are providing history as well. Patient presented today for evaluation of left hip pain. Since falling a couple days ago patient has had difficulty weightbearing and ambulating with use of walker. Patient also had a fall 2 weeks ago on her right side. The fall was not witnessed today but daughter was in the other room and heard the fall. Patient did not hit head, lose consciousness, have amnesia. Patient reports tripping on a fan today. Pain is rated as mild currently. No recent illness. No other pain. No other complaints this time.  Past medical/surgical history, social history, medications, allergies and FH have been reviewed with patient and/or in documentation. Furthermore, if pt family or friend(s) present, additional historical information was obtained from them.  Past Medical History  Diagnosis Date  . Asthma   . CAD (coronary artery disease)     Pt reports MI in 2006 (no documentation).  Cardiolite in 05/2002 and 07/2006 did not reveal any reversible ischemia.  Pt follows with Dr. Rex Kras at [facility].  . CHF (congestive heart failure)     EF 25-30% with dilated LV, mild LVH, severe hypokinesis, and mod-severe reduction in RV function  . Osteoporosis   . HYPERTENSION 08/03/2006  . GASTROPARESIS, DIABETIC 08/03/2006  . HYPERLIPIDEMIA 08/03/2006  . OBSTRUCTIVE SLEEP APNEA 01/06/2008  . PERIPHERAL NEUROPATHY 08/03/2006  . GERD 08/03/2006  . LOW BACK PAIN, CHRONIC 08/03/2006  . OSTEOPOROSIS 03/21/2009  . CEREBRAL EMBOLISM, WITH INFARCTION 07/02/2010  . Angina   . Myocardial infarction "2 or 3"  . Pneumonia 02/26/12    "a few times; probably even today"  . DIABETES MELLITUS, TYPE II 11/04/1983  . Blood transfusion      multiple in 2012, 2015.   . GI bleed 08/2011 and 01/2014  . Chronic daily headache     migraines as well.   . Stroke summer 2011    "made my left hip worse"  . Uterine cancer   . Nonischemic cardiomyopathy 05/2010    Left heart catheterization:2011. Nonobstructive coronary artery disease.  . Pulmonary hypertension   . NSTEMI (non-ST elevated myocardial infarction) 01/2014    type II with CVA  . Acute embolic stroke 04/20/6282  . Non-ST elevation MI (NSTEMI) 02/04/2014  . E. coli UTI 03/02/2014  . Renal failure, acute 02/04/2014  . Iron deficiency anemia secondary to blood loss (chronic) 01/06/2008    Qualifier: Diagnosis of  By: Tomasa Hosteller MD, Veronique D.   . Nonsustained ventricular tachycardia   . Angiodysplasia of intestine with hemorrhage 05/13/2014  . CKD (chronic kidney disease) stage 4, GFR 15-29 ml/min 08/21/2014   Past Surgical History  Procedure Laterality Date  . Esophagogastroduodenoscopy  08/26/2011    Procedure: ESOPHAGOGASTRODUODENOSCOPY (EGD);  Surgeon: [name], MD;  Location: [facility] ENDOSCOPY;  Service: Endoscopy;  Laterality: N/A;  . Colonoscopy  08/28/2011    Procedure: COLONOSCOPY;  Surgeon: [name], MD;  Location: [place];  Service: Endoscopy;  Laterality: N/A;  . Vaginal hysterectomy    . Tubal ligation    . Cataract extraction w/ intraocular lens  implant, bilateral    . Toe surgery      "right big toe; operated on it to straighten it  out; it was under"  . Polysomnogram  10/17/2005    AHI-7.28/hr. AHI REM-20.8/hr. Average oxygen saturation range during REM and NREM was 97%. Lowest oxygen saturation during REM sleep was 90%.  . Carotid duplex  05/28/2010    No significant extracranial carotid artery stenosis demonstrated. Vertebrals are patent w/ antegrade flow.  . Cardiac catheterization  05/24/2010    No intervention - recommed medical therapy.  . Cardiovascular stress test  08/07/2006    Moderate-severe defect seen in Basal inferior, Mid  inferoseptal, Mid inferior, Mid inferolateral, and Apical inferior regions - consistent w/ infarct/scar. No scintigraphic evidence of inducible myocardial ischemia.  . Transthoracic echocardiogram  08/29/2011    EF 55-60%, moderate LVH,   . Esophagogastroduodenoscopy N/A 02/08/2014    Procedure: ESOPHAGOGASTRODUODENOSCOPY (EGD);  Surgeon: [name], MD;  Location: [facility] ENDOSCOPY;  Service: Endoscopy;  Laterality: N/A;  . Colonoscopy N/A 05/12/2014    Procedure: COLONOSCOPY;  Surgeon: [name], MD;  Location: [place];  Service: Endoscopy;  Laterality: N/A;  . Enteroscopy N/A 05/13/2014    Procedure: ENTEROSCOPY;  Surgeon: [name], MD;  Location: [place];  Service: Endoscopy;  Laterality: N/A;  . Enteroscopy N/A 07/13/2014    Procedure: small bowel enteroscopy;  Surgeon: Jerene Bears, MD;  Location: [facility]COPY;  Service: Endoscopy;  Laterality: N/A;  requests slim colonoscope   Family History  Problem Relation Age of Onset  . Diabetes insipidus Mother   . Hypertension Mother   . Hypertension Father   . Hypertension Sister   . Hypertension Child   . Stomach cancer Brother    Social History  Substance Use Topics  . Smoking status: Never Smoker   . Smokeless tobacco: Former Systems developer    Types: Snuff    Quit date: 10/13/2006     Comment: 02/26/12 "stopped snuff 4-6 years ago"  . Alcohol Use: No     Comment: "stopped drinking alcohol ~ 1980's"     Review of Systems Constitutional: - F/C, -fatigue.  HENT: - congestion, -rhinorrhea, -sore throat.   Eyes: - eye pain, -visual disturbance.  Respiratory: - cough, -SOB, -hemoptysis.   Cardiovascular: - CP, -palps.  Gastrointestinal: - N/V/D, -abd pain  Genitourinary: - flank pain, -dysuria, -frequency.  Musculoskeletal: - myalgia/arthritis, -joint swelling, -gait abnormality, -back pain, -neck pain/stiffness, positive hip pain Skin: - rash/lesion.  Neurological: - focal weakness, -lightheadedness, -dizziness, -numbness,  -HA.  All other systems reviewed and are negative.   Physical Exam  Physical Exam  ED Triage Vitals  Enc Vitals Group     BP 06/02/15 1923 153/75 mmHg     Pulse Rate 06/02/15 1923 70     Resp 06/02/15 1923 18     Temp 06/02/15 1923 98.6 F (37 C)     Temp Source 06/02/15 1923 Oral     SpO2 06/02/15 1923 97 %     Weight 06/02/15 1924 130 lb 14.4 oz (59.376 kg)     Height --      Head Cir --      Peak Flow --      Pain Score 06/02/15 1921 8     Pain Loc --      Pain Edu? --      Excl. in [place]? --    Constitutional: Chronically ill-appearing elderly female in no apparent distress. Head: Normocephalic and atraumatic.  Eyes: Extraocular motion intact, no scleral icterus Mouth: MMM, OP clear Neck: Supple without meningismus, mass, or overt JVD Respiratory: No respiratory distress. Normal  WOB. No w/r/g. CV: RRR, no obvious murmurs.  Pulses +2 and symmetric. Euvolemic Abdomen: Soft, NT, ND, no r/g. No mass.  MSK: Patient has mild tenderness to palpation over L greater trochanter, L knee, L ankle. She has full range of motion of her left hip, L knee, L ankle. No signs of trauma. She is neurovascularly intact distally. With weight-bearing, patient had worsening of her L knee/hip pain. Extremities are atraumatic without deformity, ROM intact Skin: Warm, dry, intact without rash Neuro: AAOx4, MAE 5/5 sym, no focal deficit noted   ED Course  Procedures   Labs Reviewed - No data to display I personally reviewed and interpreted all labs.  Dg Tibia/fibula Left  06/02/2015   CLINICAL DATA:  Lost footing in bathroom, landed on a fan which was on the floor. LEFT hip pain.  EXAM: LEFT TIBIA AND FIBULA - 2 VIEW  COMPARISON:  None.  FINDINGS: There is no evidence of fracture or other focal bone lesions. Osteopenia. Soft tissues are nonacute; moderate moderate to severe vascular calcifications.  IMPRESSION: Negative.  Osteopenia, decreasing sensitivity for acute nondisplaced fractures.    Electronically Signed   By: Elon Alas M.D.   On: 06/02/2015 23:04   Dg Ankle Complete Left  06/02/2015   CLINICAL DATA:  Lost footing in bathroom, landed on a fan which was on the floor. LEFT hip pain.  EXAM: LEFT ANKLE COMPLETE - 3+ VIEW  COMPARISON:  None.  FINDINGS: Chronic deformity of the LEFT ankle consistent with old injury, with suspected tibiotalar arthrodesis, fibular talar arthrodesis. No acute fracture deformity. No dislocation. Moderate vascular calcifications. Osteopenia without destructive bony lesions.  IMPRESSION: Chronic deformity of the ankle compatible with old injury without acute fracture deformity or dislocation.  Osteopenia, decreasing sensitivity for acute nondisplaced fractures.   Electronically Signed   By: Elon Alas M.D.   On: 06/02/2015 23:03   Ct Hip Left Wo Contrast  06/03/2015   CLINICAL DATA:  Status post fall in bathroom onto fan, with left hip pain. Concern for fracture of the left greater femoral trochanter  EXAM: CT OF THE LEFT HIP WITHOUT CONTRAST  TECHNIQUE: Multidetector CT imaging of the left hip was performed according to the standard protocol. Multiplanar CT image reconstructions were also generated.  COMPARISON:  Left femur radiographs performed earlier today at 10:06 p.m.  FINDINGS: There is a minimally displaced avulsion fracture involving the superior aspect of the left greater femoral trochanter. No additional fractures are identified. No significant soft tissue hematoma is seen. There is diffuse joint space loss at the left hip, with associated cortical irregularity and scattered subcortical cystic change. Visualized musculature is grossly unremarkable in appearance.  Diffuse vascular calcifications are seen. Visualized small and large bowel loops are grossly unremarkable. The bladder is moderately distended and grossly unremarkable. The patient is status post hysterectomy. There is suggestion of a residual partially calcified left adnexal  fibroid.  IMPRESSION: 1. Minimally displaced avulsion fracture involving the superior aspect of the left greater femoral trochanter. 2. Chronic joint space loss at the left hip, with associated cortical irregularity and scattered subcortical cystic change. 3. Diffuse vascular calcifications seen.   Electronically Signed   By: Garald Balding M.D.   On: 06/03/2015 01:31   Dg Knee Complete 4 Views Left  06/02/2015   CLINICAL DATA:  Lost footing in bathroom, landed on a fan which was on the floor. LEFT hip pain.  EXAM: LEFT KNEE - COMPLETE 4+ VIEW  COMPARISON:  None.  FINDINGS: There  is no evidence of fracture, dislocation, or joint effusion. Patient is osteopenic. There is no evidence of arthropathy or other focal bone abnormality. Soft tissues are nonacute; moderate to severe vascular calcifications.  IMPRESSION: Negative.  Osteopenia decreases sensitivity for acute nondisplaced fractures.   Electronically Signed   By: Elon Alas M.D.   On: 06/02/2015 23:01   Dg Hip Unilat With Pelvis 2-3 Views Left  06/02/2015   CLINICAL DATA:  81 year old who fell while walking from the bedroom to the bathroom at home earlier this evening, complaining of left hip pain. Initial encounter.  EXAM: DG HIP (WITH OR WITHOUT PELVIS) 2-3V LEFT  COMPARISON:  Bone window images from CT abdomen and pelvis 02/05/2014.  FINDINGS: No evidence of acute fracture or dislocation. Complete loss of the joint space in the left hip with protrusio deformity. Likely old healed fracture of the basicervical left femoral neck accounting for the foreshortening of the left femoral neck when compared to the right, unchanged from the prior CT.  Included AP pelvis demonstrates no fractures elsewhere. Sacroiliac joints intact with degenerative changes. Symphysis pubis intact  IMPRESSION: 1. No acute osseous abnormality. 2. Severe osteoarthritis involving the left hip with associated protrusio deformity. 3. Foreshortening of the left femoral neck is  felt to be due to an old healed basicervical femoral neck fracture. The appearance is unchanged since a prior CT abdomen and pelvis 02/05/2014.   Electronically Signed   By: Evangeline Dakin M.D.   On: 06/02/2015 21:28   Dg Femur Min 2 Views Left  06/02/2015   CLINICAL DATA:  Lost footing in bathroom, landed on a fan which was on the floor. LEFT hip pain.  EXAM: LEFT FEMUR 2 VIEWS  COMPARISON:  None.  FINDINGS: Slight cortical irregularity of the greater trochanter. No dislocation. Osteopenia. No destructive bony lesions. Severe vascular calcifications.  IMPRESSION: Cortical irregularity of the greater trochanter concerning for nondisplaced acute fracture. If there is high clinical suspicion for occult hip fracture or the patient refuses to bear weight, consider further evaluation with MRI. Although CT is expeditious, evidence is lacking regarding accuracy of CT over plain film radiography.   Electronically Signed   By: Elon Alas M.D.   On: 06/02/2015 23:10   I personally viewed above image(s) which were used in my medical decision making. Formal interpretations by Radiology.   EKG Interpretation  Date/Time:    Ventricular Rate:    PR Interval:    QRS Duration:   QT Interval:    QTC Calculation:   R Axis:     Text Interpretation:         MDM: [name] is a 81 y.o. female with H&P as above who p/w CC: fall, recurrent. Hip pain.  On arrival, patient is hemodynamically stable and in no apparent distress with exam as above which is relatively benign. Plain films were sent for further evaluation of injury sustained to the left lower extremity. Plain films are unremarkable with exception of left hip with cortical irregularity of the greater trochanter concerning for a possible nondisplaced acute fracture. CT was ordered for further evaluation.  CT of left hip shows mildly displaced greater trochanteric fracture. Orthopedics was consult and they recommend weight-bear as tolerated and  close outpatient follow-up with them. Patient was given prescription for pain medicine and discharged in stable condition.  Old records reviewed (if available). Labs and imaging reviewed personally by myself and considered in medical decision making if ordered.  Clinical Impression: 1. Greater trochanter fracture,  left, closed, initial encounter   2. Leg pain     Disposition: Discharge  Condition: Good  I have discussed the results, Dx and Tx plan with the pt(& family if present). He/she/they expressed understanding and agree(s) with the plan. Discharge instructions discussed at great length. Strict return precautions discussed and pt &/or family have verbalized understanding of the instructions. No further questions at time of discharge.    Discharge Medication List as of 06/03/2015  1:13 AM    START taking these medications   Details  HYDROcodone-acetaminophen (NORCO/VICODIN) 5-325 MG per tablet Take 1 tablet by mouth every 4 (four) hours as needed for moderate pain., Starting 06/03/2015, Until Discontinued, Print        Follow Up: Iona Beard, MD [place] [address] [place] [address] [phone]   As needed  Marchia Bond, MD [place]. [address] [place] [address] [phone]  Schedule an appointment as soon as possible for a visit in 3 days   [place] [address] [id number] [place] [phone]  If symptoms worsen   Pt seen in conjunction with Dr. Domenica Reamer, [place] Emergency [facility] Resident - PGY-3      Kirstie Peri, MD 06/03/15 [id number]  Blanchie Dessert, MD 06/03/15 [id number]

## 2015-06-02 NOTE — ED Notes (Signed)
Pt states she fell last night to go to the bathroom and lost her footing. She had a fan sitting on the floor and landed on it. She is c/o left hip pain.

## 2015-06-03 ENCOUNTER — Emergency Department (HOSPITAL_COMMUNITY): Payer: Medicare Other

## 2015-06-03 DIAGNOSIS — S72112A Displaced fracture of greater trochanter of left femur, initial encounter for closed fracture: Secondary | ICD-10-CM | POA: Diagnosis not present

## 2015-06-03 MED ORDER — HYDROCODONE-ACETAMINOPHEN 5-325 MG PO TABS: 1.0000 | ORAL_TABLET | ORAL | Status: DC | PRN

## 2015-06-03 NOTE — ED Notes (Signed)
Patient left at this time with all belongings. 

## 2015-06-03 NOTE — Discharge Instructions (Signed)
WEIGHT BEAR AS TOLERATED!  Hip Fracture A hip fracture is a fracture of the upper part of your thigh bone (femur).  CAUSES A hip fracture is caused by a direct blow to the side of your hip. This is usually the result of a fall but can occur in other circumstances, such as an automobile accident. RISK FACTORS There is an increased risk of hip fractures in people with:  An unsteady walking pattern (gait) and those with conditions that contribute to poor balance, such as Parkinson's disease or dementia.  Osteopenia and osteoporosis.  Cancer that spreads to the leg bones.  Certain metabolic diseases. SYMPTOMS  Symptoms of hip fracture include:  Pain over the injured hip.  Inability to put weight on the leg in which the fracture occurred (although, some patients are able to walk after a hip fracture).  Toes and foot of the affected leg point outward when you lie down. DIAGNOSIS A physical exam can determine if a hip fracture is likely to have occurred. X-ray exams are needed to confirm the fracture and to look for other injuries. The X-ray exam can help to determine the type of hip fracture. Rarely, the fracture is not visible on an X-ray image and a CT scan or MRI will have to be done. TREATMENT  The treatment for a fracture is usually surgery. This means using a screw, nail, or rod to hold the bones in place.  HOME CARE INSTRUCTIONS Take all medicines as directed by your health care provider. SEEK MEDICAL CARE IF: Pain continues, even after taking pain medicine. MAKE SURE YOU:  Understand these instructions.   Will watch your condition.  Will get help right away if you are not doing well or get worse. Document Released: 09/29/2005 Document Revised: 10/04/2013 Document Reviewed: 05/11/2013 [facility] Patient Information 2015 [place], [place]. This information is not intended to replace advice given to you by your health care provider. Make sure you discuss any questions you have  with your health care provider.

## 2015-06-04 ENCOUNTER — Other Ambulatory Visit: Payer: Self-pay | Admitting: *Deleted

## 2015-06-04 NOTE — Patient Outreach (Signed)
[name] Care [facility]  06/04/2015  [name] Dec 17, 1932 [id number]  Telephone call to patient son. He is in the Dr office himself. Time set to call patient son at 49 am on 07/28/2015 to complete assessment 07/28/2015 10 am telephone call to patient son [name] caregiver. Per [name] he has talked with his sisters and they feel that they do not need any additional assistance. I explained the telephonic process that we are here to give them additional information and resources as they need it. That we have a pharmacist and a Education officer, museum and a Designer, television/film set also. He was not interested. I also noted that the patient had a fall over the weekend.    Assessment: Family is declining services  Plan: I will send out a [facility] packet to family.  [place] [facility] Management [phone]

## 2015-06-05 ENCOUNTER — Encounter: Payer: Self-pay | Admitting: *Deleted

## 2015-06-11 ENCOUNTER — Ambulatory Visit (INDEPENDENT_AMBULATORY_CARE_PROVIDER_SITE_OTHER): Payer: Medicare Other | Admitting: Internal Medicine

## 2015-06-11 ENCOUNTER — Encounter: Payer: Self-pay | Admitting: Internal Medicine

## 2015-06-11 VITALS — BP 120/60 | HR 64 | Ht 60.0 in | Wt 138.0 lb

## 2015-06-11 DIAGNOSIS — I639 Cerebral infarction, unspecified: Secondary | ICD-10-CM | POA: Diagnosis not present

## 2015-06-11 DIAGNOSIS — D631 Anemia in chronic kidney disease: Secondary | ICD-10-CM

## 2015-06-11 DIAGNOSIS — K5521 Angiodysplasia of colon with hemorrhage: Secondary | ICD-10-CM

## 2015-06-11 DIAGNOSIS — N189 Chronic kidney disease, unspecified: Secondary | ICD-10-CM | POA: Diagnosis not present

## 2015-06-11 NOTE — Progress Notes (Signed)
   Subjective:    Patient ID: [name], female    DOB: [birth year], 81 y.o.   MRN: [id number] Cc: anemia, GI blood loss HPI [name] is here w/ husband - she has Hx erosive gastritis and GI AVM bleeding. Also anemia of chronic dz. Now on EPO and IV iron throgh hematology and HGB almost NL.\Medications, allergies, past medical history, past surgical history, family history and social history are reviewed and updated in the EMR. Review of Systems As above - uses wheelchair    Objective:   Physical Exam BP 120/60 mmHg  Pulse 64  Ht 5' (1.524 m)  Wt 138 lb (62.596 kg)  BMI 26.95 kg/m2  LMP 12/19/1968     Assessment & Plan:  Angiodysplasia of intestine with hemorrhage  Anemia of chronic kidney failure, unspecified stage  Continue hematology support See me prn  LA:GTXM,IWOEHO K, MD

## 2015-06-11 NOTE — Patient Instructions (Signed)
   Glad things are better. I will be available to see you as needed.  I appreciate the opportunity to care for you. [name], MD, [place] [place]

## 2015-06-11 NOTE — Assessment & Plan Note (Signed)
Hematology to support

## 2015-06-11 NOTE — Assessment & Plan Note (Signed)
Hematology support

## 2015-06-15 NOTE — Patient Outreach (Signed)
[facility]) Care Management  06/15/2015  KINSEY KARCH 1933/10/03 [id number]   Notification from Johny Shock, RN to close case due to patient refused [facility] services.  Thanks, Ronnell Freshwater. [place], [place] Assistant Phone: [phone] Fax: [phone]

## 2015-07-26 ENCOUNTER — Encounter: Payer: Self-pay | Admitting: Hematology

## 2015-07-26 ENCOUNTER — Ambulatory Visit (HOSPITAL_BASED_OUTPATIENT_CLINIC_OR_DEPARTMENT_OTHER): Payer: Medicare Other | Admitting: Hematology

## 2015-07-26 ENCOUNTER — Telehealth: Payer: Self-pay | Admitting: Hematology

## 2015-07-26 ENCOUNTER — Other Ambulatory Visit (HOSPITAL_BASED_OUTPATIENT_CLINIC_OR_DEPARTMENT_OTHER): Payer: Medicare Other

## 2015-07-26 VITALS — BP 135/69 | HR 59 | Temp 97.9°F | Resp 16 | Ht 60.0 in | Wt 146.5 lb

## 2015-07-26 DIAGNOSIS — D631 Anemia in chronic kidney disease: Secondary | ICD-10-CM

## 2015-07-26 DIAGNOSIS — K922 Gastrointestinal hemorrhage, unspecified: Secondary | ICD-10-CM | POA: Diagnosis not present

## 2015-07-26 DIAGNOSIS — N184 Chronic kidney disease, stage 4 (severe): Secondary | ICD-10-CM

## 2015-07-26 DIAGNOSIS — D500 Iron deficiency anemia secondary to blood loss (chronic): Secondary | ICD-10-CM | POA: Diagnosis not present

## 2015-07-26 LAB — COMPREHENSIVE METABOLIC PANEL (CC13)
ALBUMIN: 3 g/dL — AB (ref 3.5–5.0)
ALK PHOS: 201 U/L — AB (ref 40–150)
ALT: 34 U/L (ref 0–55)
AST: 23 U/L (ref 5–34)
Anion Gap: 7 mEq/L (ref 3–11)
BILIRUBIN TOTAL: 0.38 mg/dL (ref 0.20–1.20)
BUN: 31.4 mg/dL — AB (ref 7.0–26.0)
CO2: 26 meq/L (ref 22–29)
CREATININE: 2 mg/dL — AB (ref 0.6–1.1)
Calcium: 9.2 mg/dL (ref 8.4–10.4)
Chloride: 107 mEq/L (ref 98–109)
EGFR: 27 mL/min/{1.73_m2} — AB (ref >90)
GLUCOSE: 172 mg/dL — AB (ref 70–140)
Potassium: 3.9 mEq/L (ref 3.5–5.1)
SODIUM: 140 meq/L (ref 136–145)
TOTAL PROTEIN: 6.5 g/dL (ref 6.4–8.3)

## 2015-07-26 LAB — CBC & DIFF AND RETIC
BASO%: 0.4 % (ref 0.0–2.0)
Basophils Absolute: 0 10*3/uL (ref 0.0–0.1)
EOS ABS: 0 10*3/uL (ref 0.0–0.5)
EOS%: 0.9 % (ref 0.0–7.0)
HCT: 30.6 % — ABNORMAL LOW (ref 34.8–46.6)
HEMOGLOBIN: 9.9 g/dL — AB (ref 11.6–15.9)
IMMATURE RETIC FRACT: 22.2 % — AB (ref 1.60–10.00)
LYMPH#: 1.3 10*3/uL (ref 0.9–3.3)
LYMPH%: 28.9 % (ref 14.0–49.7)
MCH: 29.4 pg (ref 25.1–34.0)
MCHC: 32.4 g/dL (ref 31.5–36.0)
MCV: 90.8 fL (ref 79.5–101.0)
MONO#: 0.3 10*3/uL (ref 0.1–0.9)
MONO%: 6.5 % (ref 0.0–14.0)
NEUT%: 63.3 % (ref 38.4–76.8)
NEUTROS ABS: 2.9 10*3/uL (ref 1.5–6.5)
Platelets: 266 10*3/uL (ref 145–400)
RBC: 3.37 10*6/uL — ABNORMAL LOW (ref 3.70–5.45)
RDW: 15.5 % — ABNORMAL HIGH (ref 11.2–14.5)
RETIC CT ABS: 121.66 10*3/uL — AB (ref 33.70–90.70)
Retic %: 3.61 % — ABNORMAL HIGH (ref 0.70–2.10)
WBC: 4.6 10*3/uL (ref 3.9–10.3)

## 2015-07-26 LAB — FERRITIN CHCC: Ferritin: 288 ng/ml — ABNORMAL HIGH (ref 9–269)

## 2015-07-26 NOTE — Telephone Encounter (Signed)
Gave adn printed appt sched and avs fo rpt for Jan 2017 °

## 2015-07-29 NOTE — Progress Notes (Signed)
.    Hematology oncology clinic note  Date of service: .07/26/2015   Patient Care Team: Iona Beard, MD as PCP - General (Family Medicine) Thelma Comp, OD (Optometry)  CHIEF COMPLAINTS/PURPOSE OF CONSULTATION: Follow-up for multifactorial anemia  HPI -Please see my previous note from 04/26/2015 for details of initial presentation.  INTERVAL HISTORY  Ms Hokenson is here for follow-up regarding the further management of her anemia. It is her birthday tomorrow she is happy about that. She is unemployed but that with her son. Notes no acute new concerns. No fatigue lightheadedness or dizziness. Hemoglobin is 9.9. Ferritin levels are still good at 288. Notes no overt GI bleeding. Hemoglobin slightly lower at 9.9. We discussed the pros and cons of starting ESA and decided to monitor things for now. No other acute new concerns.  MEDICAL HISTORY:  Past Medical History  Diagnosis Date  . Asthma   . CAD (coronary artery disease)     Pt reports MI in 2006 (no documentation).  Cardiolite in 05/2002 and 07/2006 did not reveal any reversible ischemia.  Pt follows with Dr. Rex Kras at [facility].  . CHF (congestive heart failure) (HCC)     EF 25-30% with dilated LV, mild LVH, severe hypokinesis, and mod-severe reduction in RV function  . Osteoporosis   . HYPERTENSION 08/03/2006  . GASTROPARESIS, DIABETIC 08/03/2006  . HYPERLIPIDEMIA 08/03/2006  . OBSTRUCTIVE SLEEP APNEA 01/06/2008  . PERIPHERAL NEUROPATHY 08/03/2006  . GERD 08/03/2006  . LOW BACK PAIN, CHRONIC 08/03/2006  . OSTEOPOROSIS 03/21/2009  . CEREBRAL EMBOLISM, WITH INFARCTION 07/02/2010  . Angina   . Myocardial infarction ([place]) "2 or 3"  . Pneumonia 02/26/12    "a few times; probably even today"  . DIABETES MELLITUS, TYPE II 11/04/1983  . Blood transfusion     multiple in 2012, 2015.   . GI bleed 08/2011 and 01/2014  . Chronic daily headache     migraines as well.   . Stroke [facility]) summer 2011    "made my left hip worse"  . Uterine  cancer ([place])   . Nonischemic cardiomyopathy ([place]) 05/2010    Left heart catheterization:2011. Nonobstructive coronary artery disease.  . Pulmonary hypertension ([place])   . NSTEMI (non-ST elevated myocardial infarction) ([place]) 01/2014    type II with CVA  . Acute embolic stroke ([place]) 9/32/6712  . Non-ST elevation MI (NSTEMI) ([place]) 02/04/2014  . E. coli UTI 03/02/2014  . Renal failure, acute ([place]) 02/04/2014  . Iron deficiency anemia secondary to blood loss (chronic) 01/06/2008    Qualifier: Diagnosis of  By: Tomasa Hosteller MD, Veronique D.   . Nonsustained ventricular tachycardia ([place])   . Angiodysplasia of intestine with hemorrhage 05/13/2014  . CKD (chronic kidney disease) stage 4, GFR 15-29 ml/min (HCC) 08/21/2014    SURGICAL HISTORY: Past Surgical History  Procedure Laterality Date  . Esophagogastroduodenoscopy  08/26/2011    Procedure: ESOPHAGOGASTRODUODENOSCOPY (EGD);  Surgeon: [name], MD;  Location: [facility] ENDOSCOPY;  Service: Endoscopy;  Laterality: N/A;  . Colonoscopy  08/28/2011    Procedure: COLONOSCOPY;  Surgeon: [name], MD;  Location: [place];  Service: Endoscopy;  Laterality: N/A;  . Vaginal hysterectomy    . Tubal ligation    . Cataract extraction w/ intraocular lens  implant, bilateral    . Toe surgery      "right big toe; operated on it to straighten it out; it was under"  . Polysomnogram  10/17/2005    AHI-7.28/hr. AHI REM-20.8/hr. Average oxygen saturation range during REM and  NREM was 97%. Lowest oxygen saturation during REM sleep was 90%.  . Carotid duplex  05/28/2010    No significant extracranial carotid artery stenosis demonstrated. Vertebrals are patent w/ antegrade flow.  . Cardiac catheterization  05/24/2010    No intervention - recommed medical therapy.  . Cardiovascular stress test  08/07/2006    Moderate-severe defect seen in Basal inferior, Mid inferoseptal, Mid inferior, Mid inferolateral, and Apical inferior regions - consistent w/ infarct/scar. No  scintigraphic evidence of inducible myocardial ischemia.  . Transthoracic echocardiogram  08/29/2011    EF 55-60%, moderate LVH,   . Esophagogastroduodenoscopy N/A 02/08/2014    Procedure: ESOPHAGOGASTRODUODENOSCOPY (EGD);  Surgeon: [name], MD;  Location: [facility] ENDOSCOPY;  Service: Endoscopy;  Laterality: N/A;  . Colonoscopy N/A 05/12/2014    Procedure: COLONOSCOPY;  Surgeon: [name], MD;  Location: [place];  Service: Endoscopy;  Laterality: N/A;  . Enteroscopy N/A 05/13/2014    Procedure: ENTEROSCOPY;  Surgeon: [name], MD;  Location: [place];  Service: Endoscopy;  Laterality: N/A;  . Enteroscopy N/A 07/13/2014    Procedure: small bowel enteroscopy;  Surgeon: Jerene Bears, MD;  Location: [facility] ENDOSCOPY;  Service: Endoscopy;  Laterality: N/A;  requests slim colonoscope    SOCIAL HISTORY: Social History   Social History  . Marital Status: Widowed    Spouse Name: N/A  . Number of Children: 7  . Years of Education: N/A   Occupational History  . Disabled    Social History Main Topics  . Smoking status: Never Smoker   . Smokeless tobacco: Former Systems developer    Types: Snuff    Quit date: 10/13/2006     Comment: 02/26/12 "stopped snuff 4-6 years ago"  . Alcohol Use: No     Comment: "stopped drinking alcohol ~ 1980's"  . Drug Use: No  . Sexual Activity: No   Other Topics Concern  . Not on file   Social History Narrative   ** Merged History Encounter **       Lives with daughter    FAMILY HISTORY: Family History  Problem Relation Age of Onset  . Diabetes insipidus Mother   . Hypertension Mother   . Hypertension Father   . Hypertension Sister   . Hypertension Child   . Stomach cancer Brother     ALLERGIES:  is allergic to baking soda-fluoride and magnesium hydroxide.  MEDICATIONS:  Current Outpatient Prescriptions  Medication Sig Dispense Refill  . acetaminophen (TYLENOL) 500 MG tablet Take 500 mg by mouth every 6 (six) hours as needed for moderate  pain or headache.    . albuterol (PROAIR HFA) 108 (90 BASE) MCG/ACT inhaler Inhale 2 puffs into the lungs every 6 (six) hours as needed for wheezing. 8.5 g 11  . amLODipine (NORVASC) 5 MG tablet TAKE 1 TABLET EVERY DAY 30 tablet 11  . aspirin 81 MG tablet Take 81 mg by mouth daily.    Marland Kitchen atorvastatin (LIPITOR) 40 MG tablet Take 1 tablet (40 mg total) by mouth daily at 6 PM. 30 tablet 11  . hydrALAZINE (APRESOLINE) 25 MG tablet Take 0.5 tablets (12.5 mg total) by mouth every 8 (eight) hours. 90 tablet 3  . insulin glargine (LANTUS) 100 UNIT/ML injection Inject 0.05 mLs (5 Units total) into the skin 2 (two) times daily. 10 mL 11  . isosorbide mononitrate (IMDUR) 60 MG 24 hr tablet Take 1 tablet (60 mg total) by mouth daily. 30 tablet 2  . KLOR-CON 10 10 MEQ tablet Take 20  mEq by mouth daily.     Marland Kitchen LANTUS SOLOSTAR 100 UNIT/ML Solostar Pen Inject 14 Units into the skin 2 (two) times daily.  6  . loratadine (ALLERGY RELIEF) 10 MG tablet Take 10 mg by mouth daily.    . magnesium oxide (MAG-OX) 400 (241.3 MG) MG tablet Take 1 tablet (400 mg total) by mouth 2 (two) times daily. 60 tablet 1  . metoprolol succinate (TOPROL-XL) 25 MG 24 hr tablet TAKE 1 TABLET (25 MG TOTAL) BY MOUTH DAILY. 30 tablet 5  . omeprazole (PRILOSEC) 20 MG capsule Take 1 capsule (20 mg total) by mouth daily before breakfast. 30 capsule 11  . polyethylene glycol (MIRALAX / GLYCOLAX) packet Take 17 g by mouth daily.    Marland Kitchen torsemide (DEMADEX) 10 MG tablet Take 20 mg by mouth daily.      No current facility-administered medications for this visit.    REVIEW OF SYSTEMS:    10 point review of systems was done and pertinent positives were noted in interval history. Review of Systems  All other systems reviewed and are negative.   PHYSICAL EXAMINATION: ECOG PERFORMANCE STATUS: 3 - Symptomatic, >50% confined to bed  Filed Vitals:   07/26/15 0823  BP: 135/69  Pulse: 59  Temp: 97.9 F (36.6 C)  Resp: 16   Filed Weights    07/26/15 0823  Weight: 146 lb 8 oz (66.452 kg)    GENERAL:alert, no distress and comfortable SKIN: skin color, texture, turgor are normal, no rashes or significant lesions EYES: normal, conjunctiva are pink and non-injected, sclera clear OROPHARYNX:no exudate, no erythema and lips, buccal mucosa, and tongue normal  NECK: supple, thyroid normal size, non-tender, without nodularity LYMPH:  no palpable lymphadenopathy in the cervical, axillary or inguinal LUNGS: clear to auscultation and percussion with normal breathing effort HEART: irregular ABDOMEN:abdomen soft, non-tender and normal bowel sounds Musculoskeletal:no cyanosis of digits and no clubbing  PSYCH: alert & oriented x 3 with fluent speech NEURO: no focal motor/sensory deficits  LABORATORY DATA:   . CBC Latest Ref Rng 07/26/2015 05/24/2015 04/26/2015  WBC 3.9 - 10.3 10e3/uL 4.6 5.2 4.6  Hemoglobin 11.6 - 15.9 g/dL 9.9(L) 11.8 11.5(L)  Hematocrit 34.8 - 46.6 % 30.6(L) 36.4 36.3  Platelets 145 - 400 10e3/uL 266 151 168      Recent Labs  08/22/14 0740  04/04/15 1100 04/04/15 1120 04/05/15 0530 04/26/15 1356 05/24/15 1015 07/26/15 0758  NA 138  < > 137 138 140 143 138 140  K 4.3  < > 4.4 4.4 4.7 4.6 4.3 3.9  CL 103  < > 105 104 108  --   --   --   CO2 23  < > 24  --  23 26 27 26   GLUCOSE 138*  < > 270* 273* 96 256* 440* 172*  BUN 31*  < > 36* 35* 39* 25.3 28.7* 31.4*  CREATININE 1.80*  < > 2.16* 2.20* 2.16* 2.2* 1.9* 2.0*  CALCIUM 8.7  < > 8.9  --  9.0 9.3 9.1 9.2  GFRNONAA 26*  --  21*  --  21*  --   --   --   GFRAA 30*  --  24*  --  24*  --   --   --   PROT  --   --  6.1*  --   --  6.9  --  6.5  ALBUMIN  --   --  2.8*  --   --  3.2*  --  3.0*  AST  --   --  17  --   --  21  --  23  ALT  --   --  20  --   --  21  --  34  ALKPHOS  --   --  123  --   --  144  --  201*  BILITOT  --   --  0.7  --   --  0.57  --  0.38  < > = values in this interval not displayed.  . Lab Results  Component Value Date   IRON 310*  04/05/2015   TIBC 363 04/05/2015   IRONPCTSAT 85* 04/05/2015   (Iron and TIBC)  Lab Results  Component Value Date   FERRITIN 288* 07/26/2015      RADIOGRAPHIC STUDIES: I have personally reviewed the radiological images as listed and agreed with the findings in the report. No results found.  ASSESSMENT & PLAN:   Garyn Waguespack is a very pleasant 81 year old female with multiple comorbidities with  1) Normocytic Normochromic anemia which is likely multifactorial. A)The predominant factors seem to be iron deficiency from GI blood losses with intermittent acute GI bleeding and likely chronic GI blood losses. b) she also has stage IV chronic kidney disease which is causing an element of anemia of chronic disease related to erythropoietin deficiency. c) cannot rule out myelodysplastic syndrome.  Patient notes no further overt GI bleeding. Notes no other acute new symptoms. No new fatigue. Had received IV Feraheme in July 2016.Marland Kitchen Ferritin levels at 288. Hemoglobin slowly trending down that is still reasonable at 9.9 and is not causing any overt symptoms. Plan -no indication for further IV iron therapy at this time -Continue to monitor for GI bleeding -No urgent indication for erythropoiesis stimulating agents at this time. -Given her cardiac co-morbidities and h/o CVA would try to delay ESA treatments if possible unless her hemoglobin continues to drop.  -Monthly CBC with primary care physician -Will start the patient on iron as if hemoglobin drops below 9 due to her chronic kidney disease. -Elevated alkaline phosphatase is likely due to increased bone loss from relative immobility. Further evaluation as per primary care physician.   Return to clinic with Dr. Irene Limbo with repeat CBC, CMP in 3 months. Earlier if hemoglobin drops labs were primary care physician and patient needs to be started on Aranesp sooner . I appreciate the privilege of being able to care for this wonderful person.  All  questions were answered. The patient knows to call the clinic with any problems, questions or concerns. I spent 15 minutes counseling the patient face to face. The total time spent in the appointment was 20 minutes and more than 50% was on counseling.   Sullivan Lone MD [place] Hematology/Oncology Physician [facility]  (Office):       [phone] (Work cell):  [phone] (Fax):           [phone]

## 2015-08-15 ENCOUNTER — Other Ambulatory Visit: Payer: Self-pay | Admitting: Cardiovascular Disease

## 2015-08-28 IMAGING — CT CT HEAD W/O CM
1 of 2 series · 15 of 30 positions shown, 19 images · non-contrast
Comparison: MRI 01/30/2014.  Head CT 02/04/2014.

CLINICAL DATA: Leg pain.  Shortness of breath.

EXAM:
CT HEAD WITHOUT CONTRAST
TECHNIQUE: Contiguous axial images were obtained from the base of the skull
through the vertex without intravenous contrast.

[Series 2: head 2.0 h70h · axial · 0.42mm/px · z∈[-89,+39]mm · 15 of 72 slices shown, 19 images]
[im 4/72  brain]
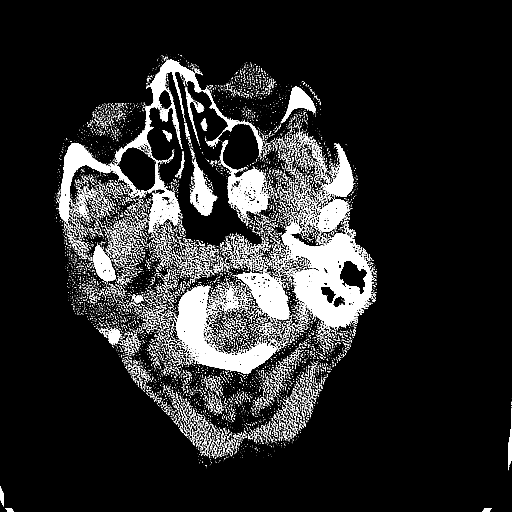
[im 4/72  bone]
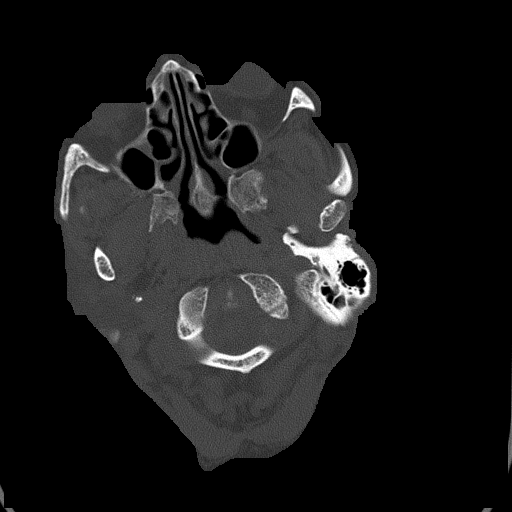
[im 8/72  brain]
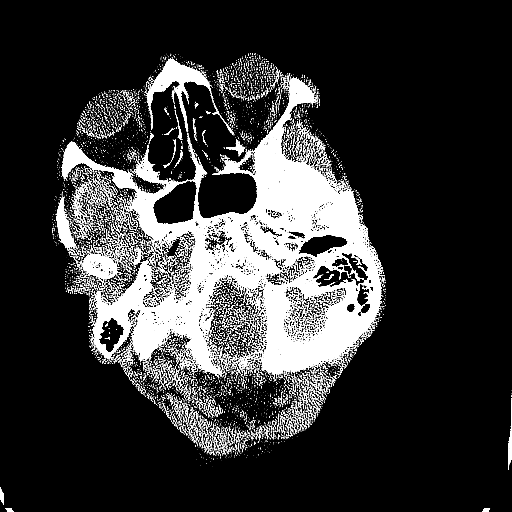
[im 15/72  brain]
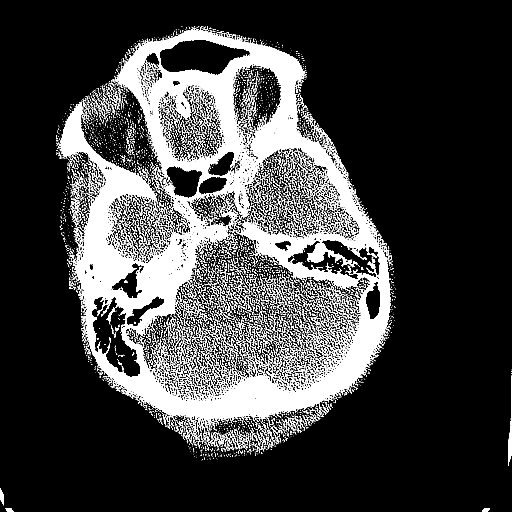
[im 18/72  brain]
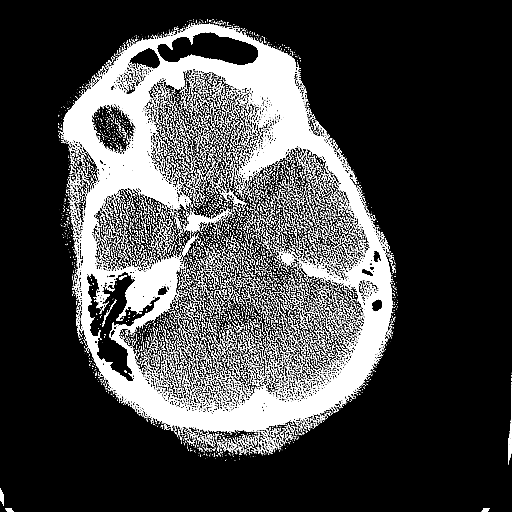
[im 22/72  brain]
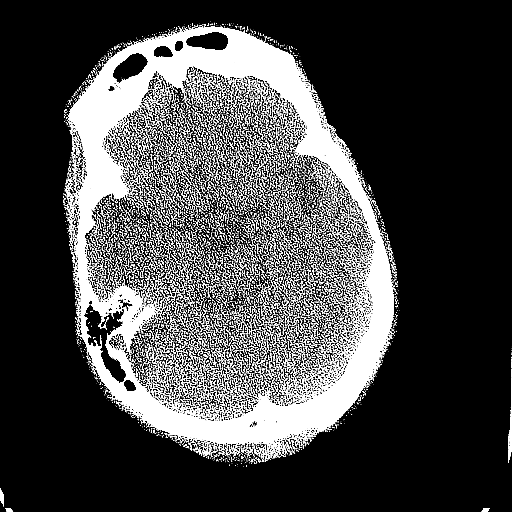
[im 22/72  bone]
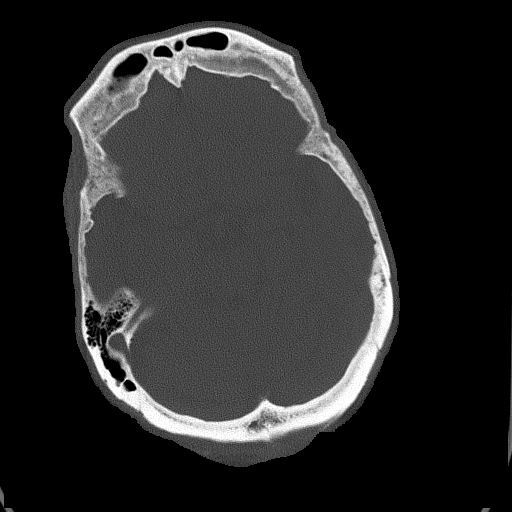
[im 25/72  brain]
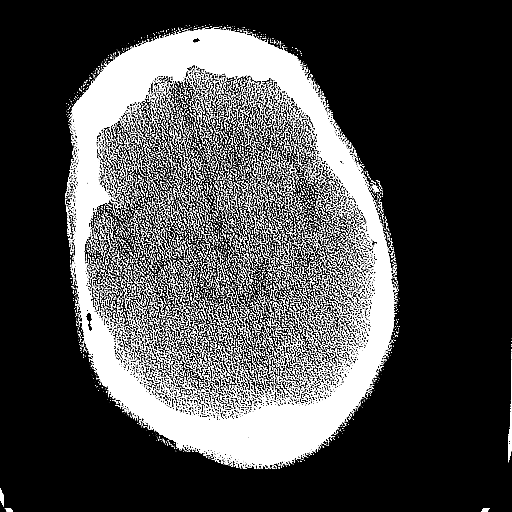
[im 32/72  brain]
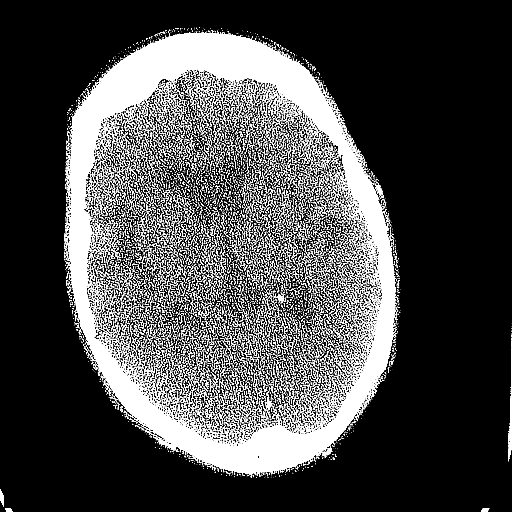
[im 36/72  brain]
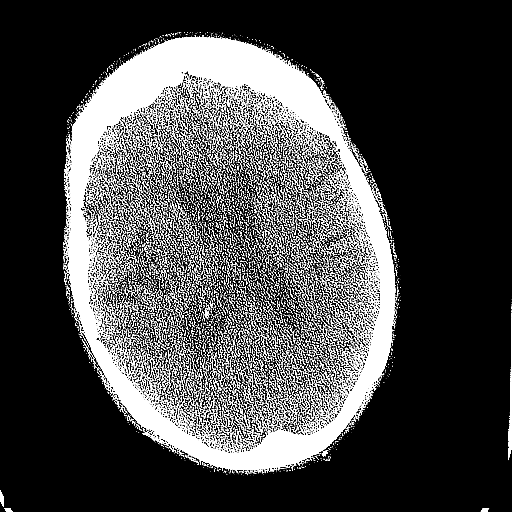
[im 40/72  brain]
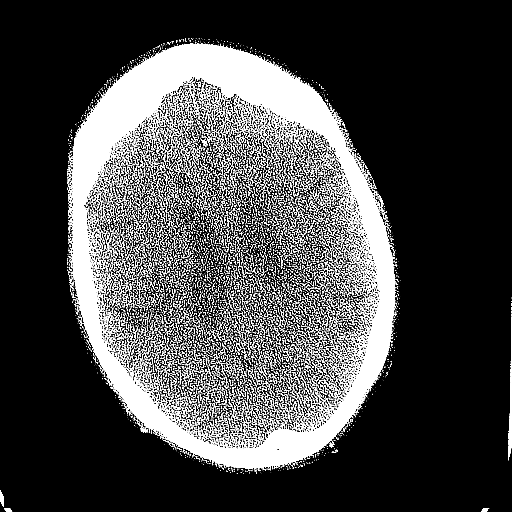
[im 40/72  bone]
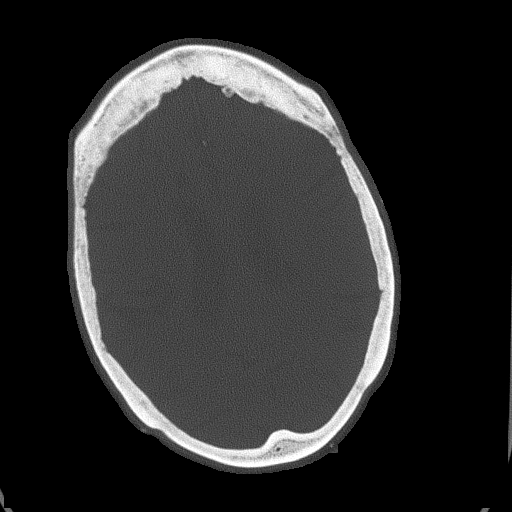
[im 47/72  brain]
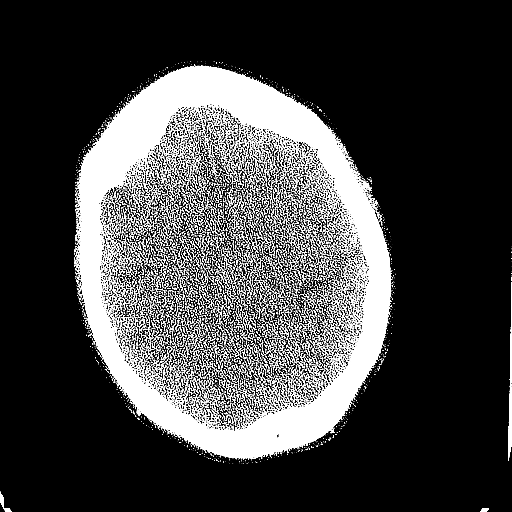
[im 50/72  brain]
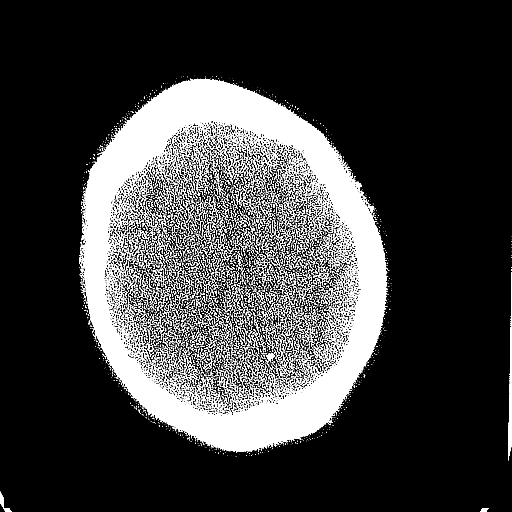
[im 54/72  brain]
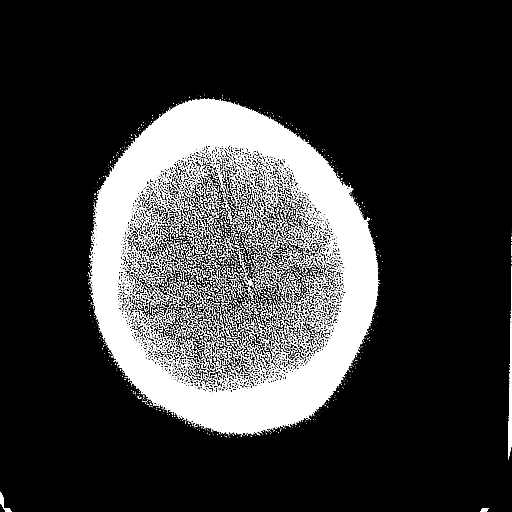
[im 57/72  brain]
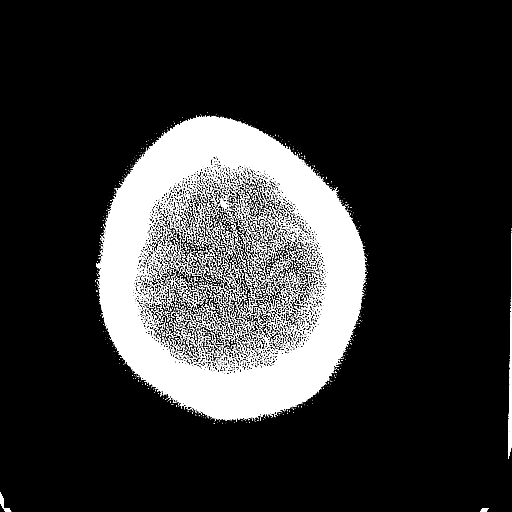
[im 57/72  bone]
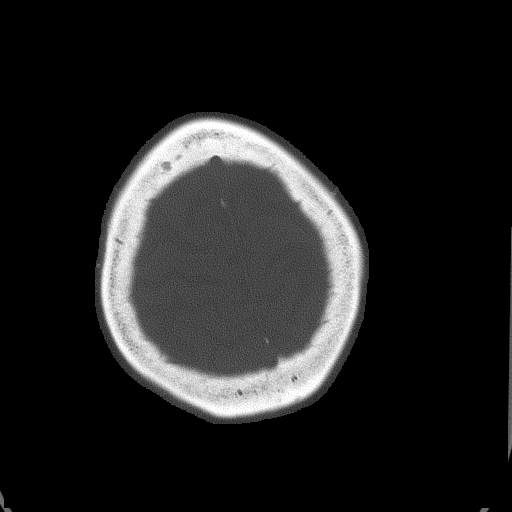
[im 64/72  brain]
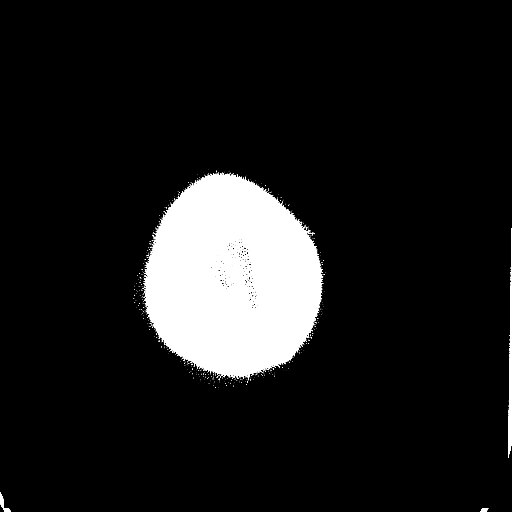
[im 68/72  brain]
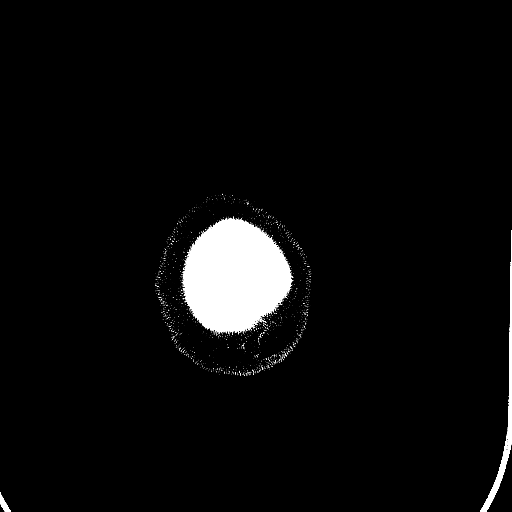

[15 of 30 positions shown; findings below may reference images not displayed]

FINDINGS: Bilateral multifocal periventricular lucencies are noted consistent
with prior identified periventricular ischemic foci. Similar
findings noted on prior MRI of 02/07/2014. Lucency is noted
throughout the lower half right temporal lobe. This is most likely
related to beam hardening artifact although a right temporal lobe
infarct cannot be completely excluded. No mass. No hemorrhage. No
acute bony abnormality.
IMPRESSION: 1. Diffuse lucency noted in the lower right temporal lobe most
likely beam hardening artifact. A right temporal lobe infarct cannot
be completely excluded.
2. Multiple periventricular punctate lucencies consistent with
previously identified multiple ischemic foci . Similar findings
noted on prior MRI brain 02/07/2014.

## 2015-08-28 IMAGING — CR DG CHEST 2V
2 series · 2 of 2 positions shown · non-contrast
Comparison: 02/04/2014

CLINICAL DATA: Shortness of breath with generalized body aches.

EXAM:
CHEST  2 VIEW

[x chest ap]
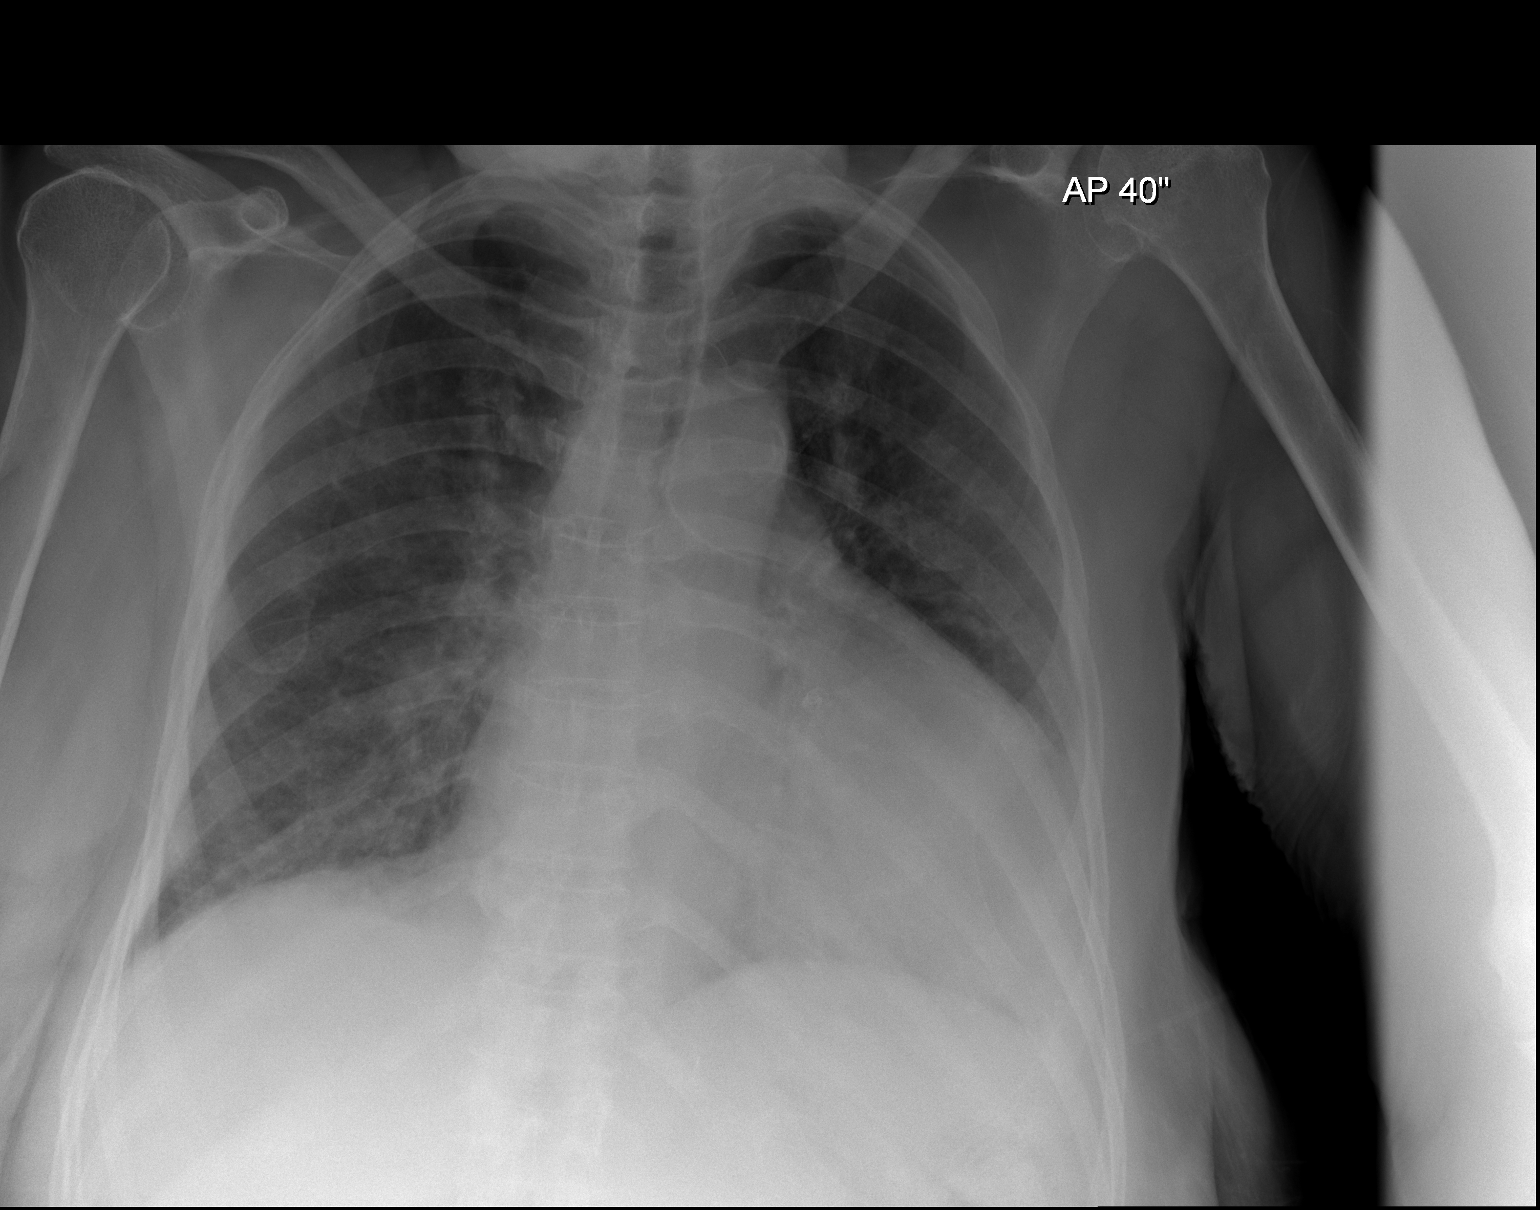

[w chest lat]
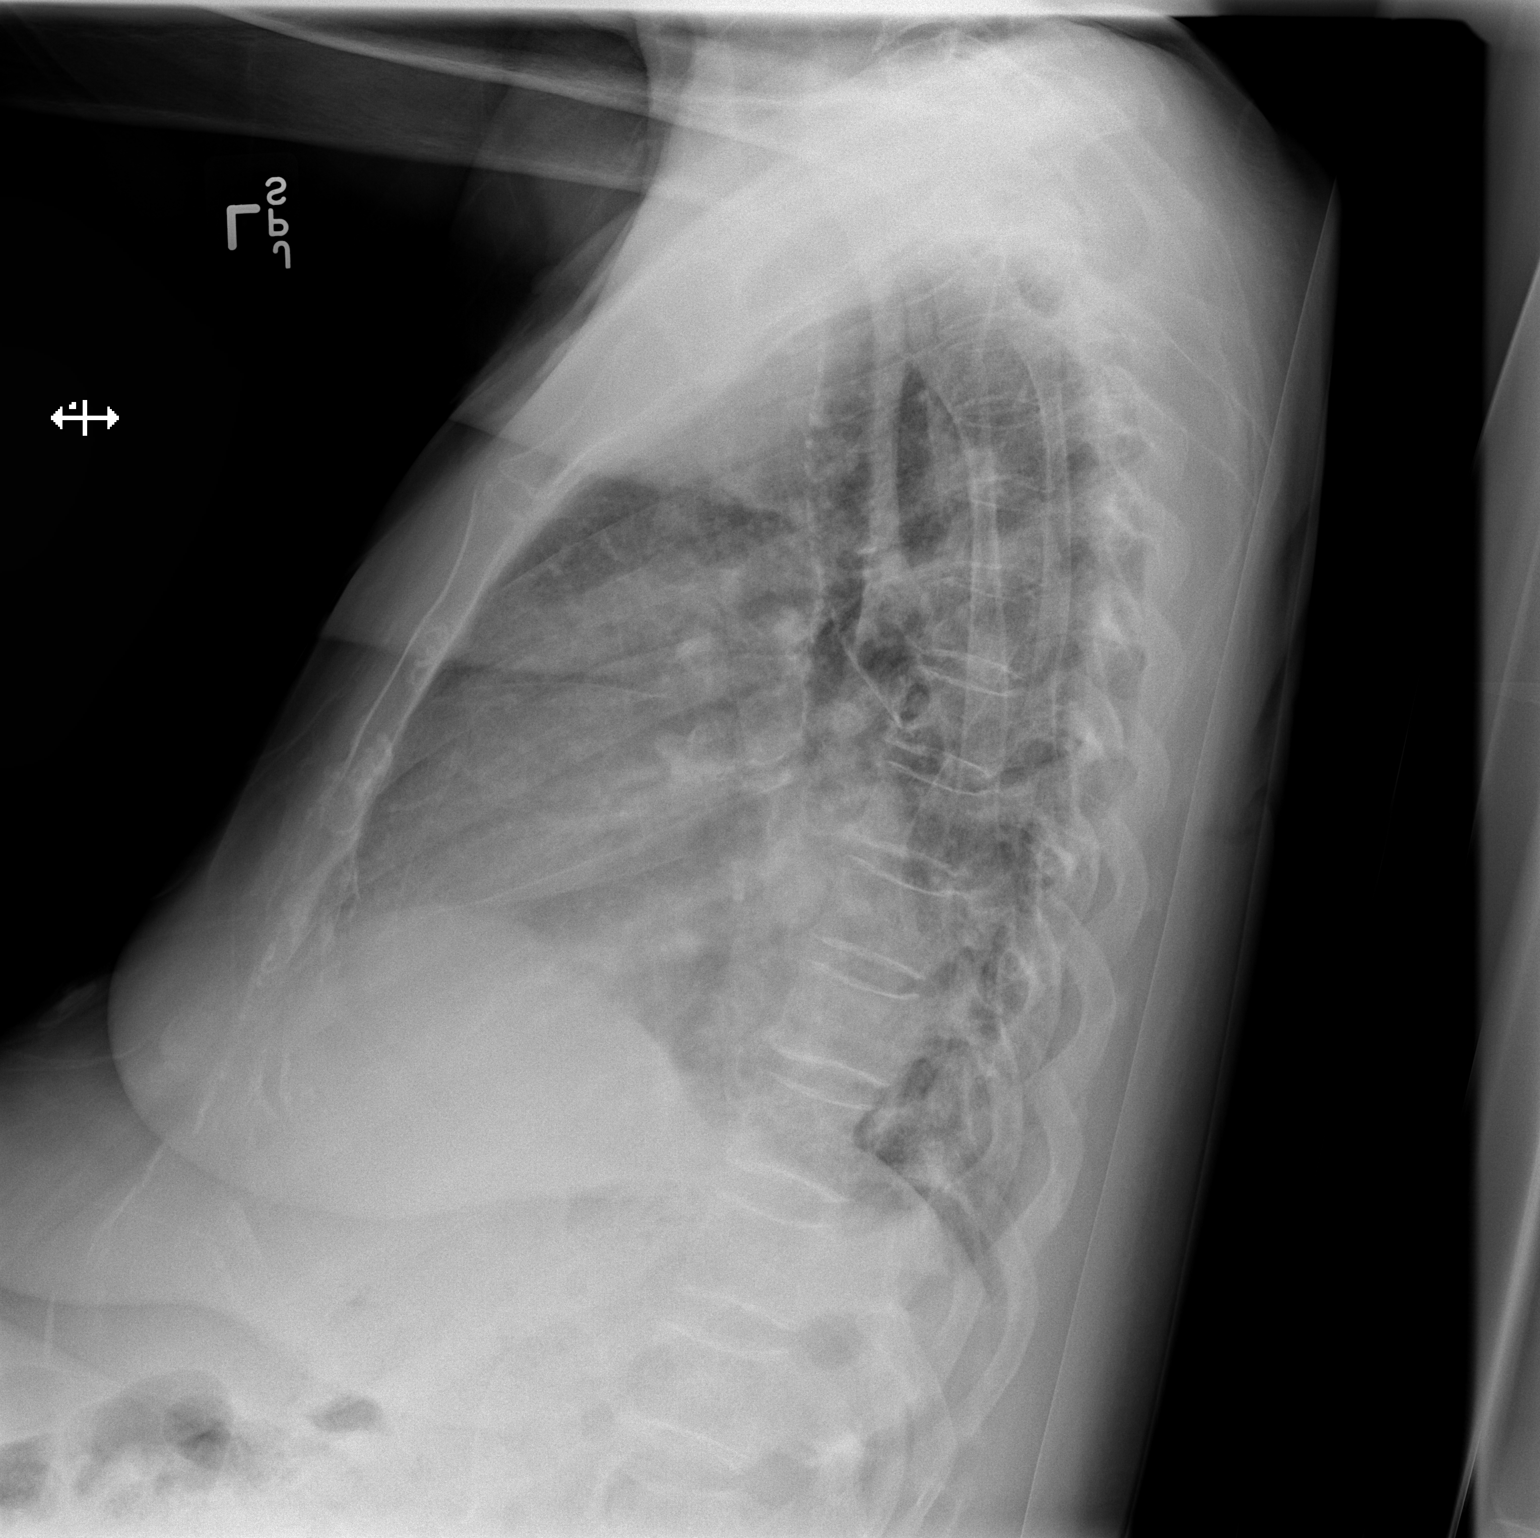

[2 of 2 positions shown; findings below may reference images not displayed]

FINDINGS: Lungs are adequately inflated without effusion. Subtle opacification
over the left lung base as cannot exclude atelectasis or infection.
Some of this density on the lateral film is likely due to the
enlarged heart and overlapping vascular structures. Moderate stable
cardiomegaly. Mild calcified plaque over the thoracic aorta.
Remainder of the exam is unchanged.
IMPRESSION: Mild left base opacification which may represent atelectasis or
infection.

Moderate stable cardiomegaly.

## 2015-09-14 ENCOUNTER — Encounter: Payer: Self-pay | Admitting: Cardiovascular Disease

## 2015-09-14 ENCOUNTER — Ambulatory Visit (INDEPENDENT_AMBULATORY_CARE_PROVIDER_SITE_OTHER): Payer: Medicare Other | Admitting: Cardiovascular Disease

## 2015-09-14 VITALS — BP 126/54 | HR 69 | Ht 60.0 in | Wt 146.0 lb

## 2015-09-14 DIAGNOSIS — I10 Essential (primary) hypertension: Secondary | ICD-10-CM | POA: Diagnosis not present

## 2015-09-14 DIAGNOSIS — I639 Cerebral infarction, unspecified: Secondary | ICD-10-CM | POA: Diagnosis not present

## 2015-09-14 DIAGNOSIS — I429 Cardiomyopathy, unspecified: Secondary | ICD-10-CM | POA: Diagnosis not present

## 2015-09-14 DIAGNOSIS — I5023 Acute on chronic systolic (congestive) heart failure: Secondary | ICD-10-CM | POA: Diagnosis not present

## 2015-09-14 DIAGNOSIS — E785 Hyperlipidemia, unspecified: Secondary | ICD-10-CM | POA: Diagnosis not present

## 2015-09-14 DIAGNOSIS — I428 Other cardiomyopathies: Secondary | ICD-10-CM

## 2015-09-14 NOTE — Assessment & Plan Note (Signed)
History of hyperlipidemia on atorvastatin followed by her PCP 

## 2015-09-14 NOTE — Assessment & Plan Note (Signed)
History of nonischemic cardio myopathy EF in the 30% range and cardiac catheterization in the past has shown minimal CAD. She denies chest pain or shortness of breath.

## 2015-09-14 NOTE — Progress Notes (Signed)
09/14/2015 [name]   [birth year]  [id number]  Primary Physician Cheryl Font, MD Primary Cardiologist: Cheryl Harp MD [name]   HPI:  [name] is a 81 year old thin appearing widowed African American female mother of 87 living children accompanied by her son today. She has been seen by [name] in the past. I last saw her 09/01/14.She's a history of nonischemic myopathy documented by cardiac catheterization performed at [name] 05/24/10. She had minimal CAD with an ejection fraction of 20%. Her other problems include treated hypertension, hyperlipidemia and diabetes. She has had a stroke in the past and walks with the aid of a walker. She was recently recently hospitalized from 2/21 to the 23rd with congestive heart failure. Pro BNP was 7000 and she was treated with IV diuretics. She was hospitalized again in April with a stroke and a non-STEMI. Consideration was given to putting her on oral anticoagulant event monitor was obtained which did not show A. Fib with rapid PVCs and a short burst of nonsustained ventricular tachycardia. She was primarily a bed to chair existence in her home and infrequently gets out of the house.she was hospitalized earlier this month for shortness of breath thought to be pneumonia. She was treated with antibiotics. [name] saw her in consultation. 2-D echo revealed an EF of 30% with severe mitral regurgitation. She did have a nonischemic Myoview in July 2015.  Current Outpatient Prescriptions  Medication Sig Dispense Refill  . acetaminophen (TYLENOL) 500 MG tablet Take 500 mg by mouth every 6 (six) hours as needed for moderate pain or headache.    . albuterol (PROAIR HFA) 108 (90 BASE) MCG/ACT inhaler Inhale 2 puffs into the lungs every 6 (six) hours as needed for wheezing. 8.5 g 11  . amLODipine (NORVASC) 5 MG tablet TAKE 1 TABLET EVERY DAY 30 tablet 11  . aspirin 81 MG tablet Take 81 mg by mouth daily.    Marland Kitchen atorvastatin  (LIPITOR) 40 MG tablet Take 1 tablet (40 mg total) by mouth daily at 6 PM. 30 tablet 11  . hydrALAZINE (APRESOLINE) 25 MG tablet Take 0.5 tablets (12.5 mg total) by mouth every 8 (eight) hours. 90 tablet 3  . insulin glargine (LANTUS) 100 UNIT/ML injection Inject 0.05 mLs (5 Units total) into the skin 2 (two) times daily. 10 mL 11  . isosorbide mononitrate (IMDUR) 60 MG 24 hr tablet Take 1 tablet (60 mg total) by mouth daily. 30 tablet 2  . KLOR-CON 10 10 MEQ tablet Take 20 mEq by mouth daily.     Marland Kitchen LANTUS SOLOSTAR 100 UNIT/ML Solostar Pen Inject 14 Units into the skin 2 (two) times daily.  6  . loratadine (ALLERGY RELIEF) 10 MG tablet Take 10 mg by mouth daily.    . magnesium oxide (MAG-OX) 400 (241.3 MG) MG tablet Take 1 tablet (400 mg total) by mouth 2 (two) times daily. 60 tablet 1  . metoprolol succinate (TOPROL-XL) 25 MG 24 hr tablet TAKE 1 TABLET (25 MG TOTAL) BY MOUTH DAILY. 30 tablet 5  . omeprazole (PRILOSEC) 20 MG capsule Take 1 capsule (20 mg total) by mouth daily before breakfast. 30 capsule 11  . polyethylene glycol (MIRALAX / GLYCOLAX) packet Take 17 g by mouth daily.    Marland Kitchen torsemide (DEMADEX) 10 MG tablet Take 20 mg by mouth daily.      No current facility-administered medications for this visit.    Allergies  Allergen Reactions  . Baking Soda-Fluoride [Sodium Fluoride]  Nausea And Vomiting  . Magnesium Hydroxide Nausea And Vomiting    Social History   Social History  . Marital Status: Widowed    Spouse Name: N/A  . Number of Children: 7  . Years of Education: N/A   Occupational History  . Disabled    Social History Main Topics  . Smoking status: Never Smoker   . Smokeless tobacco: Former Systems developer    Types: Snuff    Quit date: 10/13/2006     Comment: 02/26/12 "stopped snuff 4-6 years ago"  . Alcohol Use: No     Comment: "stopped drinking alcohol ~ 1980's"  . Drug Use: No  . Sexual Activity: No   Other Topics Concern  . Not on file   Social History Narrative    ** Merged History Encounter **       Lives with daughter     Review of Systems: General: negative for chills, fever, night sweats or weight changes.  Cardiovascular: negative for chest pain, dyspnea on exertion, edema, orthopnea, palpitations, paroxysmal nocturnal dyspnea or shortness of breath Dermatological: negative for rash Respiratory: negative for cough or wheezing Urologic: negative for hematuria Abdominal: negative for nausea, vomiting, diarrhea, bright red blood per rectum, melena, or hematemesis Neurologic: negative for visual changes, syncope, or dizziness All other systems reviewed and are otherwise negative except as noted above.    Blood pressure 126/54, pulse 69, height 5' (1.524 m), weight 146 lb (66.225 kg), last menstrual period 12/19/1968.  General appearance: alert and no distress Neck: no adenopathy, no carotid bruit, no JVD, supple, symmetrical, trachea midline and thyroid not enlarged, symmetric, no tenderness/mass/nodules Lungs: clear to auscultation bilaterally Heart: regular rate and rhythm, S1, S2 normal, no murmur, click, rub or gallop Extremities: trace edema bilaterally  EKG not performed today  ASSESSMENT AND PLAN:   Nonischemic cardiomyopathy (HCC) History of nonischemic cardio myopathy EF in the 30% range and cardiac catheterization in the past has shown minimal CAD. She denies chest pain or shortness of breath.  Hyperlipidemia History of hyperlipidemia on atorvastatin followed by her PCP  Essential hypertension History of hypertension blood pressure measured at 126/54. She is on hydralazine and metoprolol as well as amlodipine. Continue current meds at current dosing  Acute on chronic systolic CHF (congestive heart failure), NYHA class 3 (HCC) History of nonischemic cardiopathy with ejection fraction of 30%. She is minimally active and compensated      Cheryl Harp MD [facility], [facility] 09/14/2015 11:37 AM

## 2015-09-14 NOTE — Patient Instructions (Signed)

## 2015-09-14 NOTE — Assessment & Plan Note (Signed)
History of hypertension blood pressure measured at 126/54. She is on hydralazine and metoprolol as well as amlodipine. Continue current meds at current dosing

## 2015-09-14 NOTE — Assessment & Plan Note (Signed)
History of nonischemic cardiopathy with ejection fraction of 30%. She is minimally active and compensated

## 2015-09-17 ENCOUNTER — Other Ambulatory Visit: Payer: Self-pay | Admitting: Internal Medicine

## 2015-09-17 ENCOUNTER — Other Ambulatory Visit: Payer: Self-pay | Admitting: Cardiovascular Disease

## 2015-09-17 NOTE — Telephone Encounter (Signed)
Rx request sent to pharmacy.  

## 2015-10-25 ENCOUNTER — Ambulatory Visit: Payer: Self-pay | Admitting: Hematology

## 2015-10-25 ENCOUNTER — Other Ambulatory Visit: Payer: Self-pay

## 2015-11-04 ENCOUNTER — Inpatient Hospital Stay (HOSPITAL_COMMUNITY)
Admission: EM | Admit: 2015-11-04 | Discharge: 2015-11-07 | DRG: 291 | Disposition: A | Discharge status: Home Health | Payer: Commercial Managed Care - HMO | Attending: Internal Medicine | Admitting: Internal Medicine

## 2015-11-04 ENCOUNTER — Emergency Department (HOSPITAL_COMMUNITY): Payer: Commercial Managed Care - HMO

## 2015-11-04 ENCOUNTER — Encounter (HOSPITAL_COMMUNITY): Payer: Self-pay | Admitting: Emergency Medicine

## 2015-11-04 DIAGNOSIS — I5043 Acute on chronic combined systolic (congestive) and diastolic (congestive) heart failure: Secondary | ICD-10-CM | POA: Diagnosis not present

## 2015-11-04 DIAGNOSIS — Z961 Presence of intraocular lens: Secondary | ICD-10-CM | POA: Diagnosis present

## 2015-11-04 DIAGNOSIS — R079 Chest pain, unspecified: Secondary | ICD-10-CM | POA: Diagnosis present

## 2015-11-04 DIAGNOSIS — I071 Rheumatic tricuspid insufficiency: Secondary | ICD-10-CM

## 2015-11-04 DIAGNOSIS — Z66 Do not resuscitate: Secondary | ICD-10-CM | POA: Diagnosis present

## 2015-11-04 DIAGNOSIS — I429 Cardiomyopathy, unspecified: Secondary | ICD-10-CM | POA: Diagnosis present

## 2015-11-04 DIAGNOSIS — I509 Heart failure, unspecified: Secondary | ICD-10-CM | POA: Insufficient documentation

## 2015-11-04 DIAGNOSIS — Z794 Long term (current) use of insulin: Secondary | ICD-10-CM

## 2015-11-04 DIAGNOSIS — R0600 Dyspnea, unspecified: Secondary | ICD-10-CM | POA: Diagnosis present

## 2015-11-04 DIAGNOSIS — N189 Chronic kidney disease, unspecified: Secondary | ICD-10-CM

## 2015-11-04 DIAGNOSIS — I371 Nonrheumatic pulmonary valve insufficiency: Secondary | ICD-10-CM

## 2015-11-04 DIAGNOSIS — N184 Chronic kidney disease, stage 4 (severe): Secondary | ICD-10-CM | POA: Diagnosis present

## 2015-11-04 DIAGNOSIS — E870 Hyperosmolality and hypernatremia: Secondary | ICD-10-CM | POA: Diagnosis present

## 2015-11-04 DIAGNOSIS — R0789 Other chest pain: Secondary | ICD-10-CM | POA: Diagnosis not present

## 2015-11-04 DIAGNOSIS — Z9841 Cataract extraction status, right eye: Secondary | ICD-10-CM

## 2015-11-04 DIAGNOSIS — E1143 Type 2 diabetes mellitus with diabetic autonomic (poly)neuropathy: Secondary | ICD-10-CM | POA: Diagnosis present

## 2015-11-04 DIAGNOSIS — I10 Essential (primary) hypertension: Secondary | ICD-10-CM | POA: Diagnosis not present

## 2015-11-04 DIAGNOSIS — I130 Hypertensive heart and chronic kidney disease with heart failure and stage 1 through stage 4 chronic kidney disease, or unspecified chronic kidney disease: Principal | ICD-10-CM | POA: Diagnosis present

## 2015-11-04 DIAGNOSIS — D500 Iron deficiency anemia secondary to blood loss (chronic): Secondary | ICD-10-CM | POA: Diagnosis present

## 2015-11-04 DIAGNOSIS — I5023 Acute on chronic systolic (congestive) heart failure: Secondary | ICD-10-CM

## 2015-11-04 DIAGNOSIS — Z79899 Other long term (current) drug therapy: Secondary | ICD-10-CM

## 2015-11-04 DIAGNOSIS — I4891 Unspecified atrial fibrillation: Secondary | ICD-10-CM | POA: Diagnosis present

## 2015-11-04 DIAGNOSIS — I69354 Hemiplegia and hemiparesis following cerebral infarction affecting left non-dominant side: Secondary | ICD-10-CM

## 2015-11-04 DIAGNOSIS — E1165 Type 2 diabetes mellitus with hyperglycemia: Secondary | ICD-10-CM | POA: Diagnosis present

## 2015-11-04 DIAGNOSIS — Z9842 Cataract extraction status, left eye: Secondary | ICD-10-CM

## 2015-11-04 DIAGNOSIS — M810 Age-related osteoporosis without current pathological fracture: Secondary | ICD-10-CM | POA: Diagnosis present

## 2015-11-04 DIAGNOSIS — I2510 Atherosclerotic heart disease of native coronary artery without angina pectoris: Secondary | ICD-10-CM | POA: Diagnosis present

## 2015-11-04 DIAGNOSIS — I340 Nonrheumatic mitral (valve) insufficiency: Secondary | ICD-10-CM | POA: Insufficient documentation

## 2015-11-04 DIAGNOSIS — I252 Old myocardial infarction: Secondary | ICD-10-CM

## 2015-11-04 DIAGNOSIS — K219 Gastro-esophageal reflux disease without esophagitis: Secondary | ICD-10-CM | POA: Diagnosis present

## 2015-11-04 DIAGNOSIS — Z7982 Long term (current) use of aspirin: Secondary | ICD-10-CM

## 2015-11-04 DIAGNOSIS — Z8249 Family history of ischemic heart disease and other diseases of the circulatory system: Secondary | ICD-10-CM

## 2015-11-04 DIAGNOSIS — R413 Other amnesia: Secondary | ICD-10-CM | POA: Diagnosis present

## 2015-11-04 DIAGNOSIS — E0800 Diabetes mellitus due to underlying condition with hyperosmolarity without nonketotic hyperglycemic-hyperosmolar coma (NKHHC): Secondary | ICD-10-CM | POA: Diagnosis present

## 2015-11-04 DIAGNOSIS — E785 Hyperlipidemia, unspecified: Secondary | ICD-10-CM | POA: Diagnosis present

## 2015-11-04 DIAGNOSIS — E1122 Type 2 diabetes mellitus with diabetic chronic kidney disease: Secondary | ICD-10-CM | POA: Diagnosis present

## 2015-11-04 DIAGNOSIS — J45909 Unspecified asthma, uncomplicated: Secondary | ICD-10-CM | POA: Diagnosis present

## 2015-11-04 DIAGNOSIS — Z8542 Personal history of malignant neoplasm of other parts of uterus: Secondary | ICD-10-CM

## 2015-11-04 DIAGNOSIS — I480 Paroxysmal atrial fibrillation: Secondary | ICD-10-CM | POA: Diagnosis present

## 2015-11-04 DIAGNOSIS — IMO0002 Reserved for concepts with insufficient information to code with codable children: Secondary | ICD-10-CM | POA: Diagnosis present

## 2015-11-04 LAB — BASIC METABOLIC PANEL
ANION GAP: 9 (ref 5–15)
BUN: 27 mg/dL — ABNORMAL HIGH (ref 6–20)
CALCIUM: 9.1 mg/dL (ref 8.9–10.3)
CO2: 25 mmol/L (ref 22–32)
Chloride: 109 mmol/L (ref 101–111)
Creatinine, Ser: 1.74 mg/dL — ABNORMAL HIGH (ref 0.44–1.00)
GFR, EST AFRICAN AMERICAN: 31 mL/min — AB (ref >60)
GFR, EST NON AFRICAN AMERICAN: 27 mL/min — AB (ref >60)
Glucose, Bld: 125 mg/dL — ABNORMAL HIGH (ref 65–99)
POTASSIUM: 4.9 mmol/L (ref 3.5–5.1)
SODIUM: 143 mmol/L (ref 135–145)

## 2015-11-04 LAB — GLUCOSE, CAPILLARY
Glucose-Capillary: 170 mg/dL — ABNORMAL HIGH (ref 65–99)
Glucose-Capillary: 175 mg/dL — ABNORMAL HIGH (ref 65–99)

## 2015-11-04 LAB — CBC WITH DIFFERENTIAL/PLATELET
BASOS ABS: 0 10*3/uL (ref 0.0–0.1)
BASOS PCT: 1 %
EOS ABS: 0 10*3/uL (ref 0.0–0.7)
EOS PCT: 1 %
HCT: 34.9 % — ABNORMAL LOW (ref 36.0–46.0)
Hemoglobin: 11.2 g/dL — ABNORMAL LOW (ref 12.0–15.0)
LYMPHS ABS: 1 10*3/uL (ref 0.7–4.0)
Lymphocytes Relative: 24 %
MCH: 27.3 pg (ref 26.0–34.0)
MCHC: 32.1 g/dL (ref 30.0–36.0)
MCV: 85.1 fL (ref 78.0–100.0)
Monocytes Absolute: 0.4 10*3/uL (ref 0.1–1.0)
Monocytes Relative: 9 %
Neutro Abs: 2.7 10*3/uL (ref 1.7–7.7)
Neutrophils Relative %: 65 %
PLATELETS: 235 10*3/uL (ref 150–400)
RBC: 4.1 MIL/uL (ref 3.87–5.11)
RDW: 19.8 % — ABNORMAL HIGH (ref 11.5–15.5)
WBC: 4.1 10*3/uL (ref 4.0–10.5)

## 2015-11-04 LAB — I-STAT TROPONIN, ED: TROPONIN I, POC: 0.04 ng/mL (ref 0.00–0.08)

## 2015-11-04 LAB — BRAIN NATRIURETIC PEPTIDE: B NATRIURETIC PEPTIDE 5: 1841.7 pg/mL — AB (ref 0.0–100.0)

## 2015-11-04 LAB — TROPONIN I: Troponin I: 0.04 ng/mL — ABNORMAL HIGH (ref <0.031)

## 2015-11-04 MED ORDER — FUROSEMIDE 10 MG/ML IJ SOLN
60.0000 mg | Freq: Once | INTRAMUSCULAR | Status: AC
  Administered 2015-11-04: 60 mg via INTRAVENOUS
  Filled 2015-11-04: qty 6

## 2015-11-04 MED ORDER — HYDROCODONE-ACETAMINOPHEN 5-325 MG PO TABS
1.0000 | ORAL_TABLET | ORAL | Status: DC | PRN
  Administered 2015-11-05 – 2015-11-06 (×2): 1 via ORAL
  Filled 2015-11-04 (×2): qty 1

## 2015-11-04 MED ORDER — ASPIRIN 81 MG PO TABS: 81.0000 mg | ORAL_TABLET | Freq: Every day | ORAL | Status: DC

## 2015-11-04 MED ORDER — ISOSORBIDE MONONITRATE ER 60 MG PO TB24
60.0000 mg | ORAL_TABLET | Freq: Every day | ORAL | Status: DC
Start: 2015-11-04 — End: 2015-11-07
  Administered 2015-11-05 – 2015-11-07 (×3): 60 mg via ORAL
  Filled 2015-11-04 (×3): qty 1

## 2015-11-04 MED ORDER — ACETAMINOPHEN 650 MG RE SUPP: 650.0000 mg | Freq: Four times a day (QID) | RECTAL | Status: DC | PRN

## 2015-11-04 MED ORDER — ONDANSETRON HCL 4 MG/2ML IJ SOLN: 4.0000 mg | Freq: Four times a day (QID) | INTRAMUSCULAR | Status: DC | PRN

## 2015-11-04 MED ORDER — AMLODIPINE BESYLATE 5 MG PO TABS
5.0000 mg | ORAL_TABLET | Freq: Every day | ORAL | Status: DC
  Administered 2015-11-05 – 2015-11-07 (×3): 5 mg via ORAL
  Filled 2015-11-04: qty 1
  Filled 2015-11-04: qty 2
  Filled 2015-11-04: qty 1

## 2015-11-04 MED ORDER — PANTOPRAZOLE SODIUM 40 MG PO TBEC
40.0000 mg | DELAYED_RELEASE_TABLET | Freq: Every day | ORAL | Status: DC
Start: 2015-11-04 — End: 2015-11-07
  Administered 2015-11-05 – 2015-11-07 (×3): 40 mg via ORAL
  Filled 2015-11-04 (×3): qty 1

## 2015-11-04 MED ORDER — SODIUM CHLORIDE 0.9 % IJ SOLN
3.0000 mL | Freq: Two times a day (BID) | INTRAMUSCULAR | Status: DC
  Administered 2015-11-04 – 2015-11-07 (×6): 3 mL via INTRAVENOUS

## 2015-11-04 MED ORDER — SODIUM CHLORIDE 0.9 % IJ SOLN: 3.0000 mL | INTRAMUSCULAR | Status: DC | PRN

## 2015-11-04 MED ORDER — ATORVASTATIN CALCIUM 40 MG PO TABS
40.0000 mg | ORAL_TABLET | Freq: Every day | ORAL | Status: DC
  Administered 2015-11-04 – 2015-11-06 (×3): 40 mg via ORAL
  Filled 2015-11-04 (×3): qty 1

## 2015-11-04 MED ORDER — SODIUM CHLORIDE 0.9 % IV SOLN: 250.0000 mL | INTRAVENOUS | Status: DC | PRN

## 2015-11-04 MED ORDER — ACETAMINOPHEN 325 MG PO TABS: 650.0000 mg | ORAL_TABLET | Freq: Four times a day (QID) | ORAL | Status: DC | PRN

## 2015-11-04 MED ORDER — METOPROLOL SUCCINATE ER 25 MG PO TB24
25.0000 mg | ORAL_TABLET | Freq: Every day | ORAL | Status: DC
  Administered 2015-11-05 – 2015-11-07 (×3): 25 mg via ORAL
  Filled 2015-11-04 (×3): qty 1

## 2015-11-04 MED ORDER — INSULIN ASPART 100 UNIT/ML ~~LOC~~ SOLN
0.0000 [IU] | Freq: Three times a day (TID) | SUBCUTANEOUS | Status: DC
  Administered 2015-11-04: 3 [IU] via SUBCUTANEOUS
  Administered 2015-11-05: 1 [IU] via SUBCUTANEOUS
  Administered 2015-11-05: 5 [IU] via SUBCUTANEOUS
  Administered 2015-11-06 (×3): 3 [IU] via SUBCUTANEOUS

## 2015-11-04 MED ORDER — SODIUM CHLORIDE 0.9 % IJ SOLN
3.0000 mL | Freq: Two times a day (BID) | INTRAMUSCULAR | Status: DC
  Administered 2015-11-04 – 2015-11-07 (×5): 3 mL via INTRAVENOUS

## 2015-11-04 MED ORDER — FUROSEMIDE 10 MG/ML IJ SOLN
60.0000 mg | Freq: Three times a day (TID) | INTRAMUSCULAR | Status: DC
  Administered 2015-11-05 – 2015-11-06 (×6): 60 mg via INTRAVENOUS
  Filled 2015-11-04 (×6): qty 6

## 2015-11-04 MED ORDER — POLYETHYLENE GLYCOL 3350 17 G PO PACK: 17.0000 g | PACK | Freq: Every day | ORAL | Status: DC | PRN

## 2015-11-04 MED ORDER — NITROGLYCERIN 2 % TD OINT: 1.0000 [in_us] | TOPICAL_OINTMENT | Freq: Once | TRANSDERMAL | Status: DC

## 2015-11-04 MED ORDER — ALBUTEROL SULFATE (2.5 MG/3ML) 0.083% IN NEBU
2.5000 mg | INHALATION_SOLUTION | Freq: Four times a day (QID) | RESPIRATORY_TRACT | Status: DC
  Administered 2015-11-04 – 2015-11-05 (×4): 2.5 mg via RESPIRATORY_TRACT
  Filled 2015-11-04 (×4): qty 3

## 2015-11-04 MED ORDER — HEPARIN SODIUM (PORCINE) 5000 UNIT/ML IJ SOLN
5000.0000 [IU] | Freq: Three times a day (TID) | INTRAMUSCULAR | Status: DC
  Administered 2015-11-04 – 2015-11-07 (×8): 5000 [IU] via SUBCUTANEOUS
  Filled 2015-11-04 (×7): qty 1

## 2015-11-04 MED ORDER — ONDANSETRON HCL 4 MG PO TABS: 4.0000 mg | ORAL_TABLET | Freq: Four times a day (QID) | ORAL | Status: DC | PRN

## 2015-11-04 MED ORDER — INSULIN GLARGINE 100 UNIT/ML ~~LOC~~ SOLN
7.0000 [IU] | Freq: Every day | SUBCUTANEOUS | Status: DC
  Administered 2015-11-04 – 2015-11-06 (×3): 7 [IU] via SUBCUTANEOUS
  Filled 2015-11-04 (×4): qty 0.07

## 2015-11-04 MED ORDER — POTASSIUM CHLORIDE CRYS ER 20 MEQ PO TBCR
20.0000 meq | EXTENDED_RELEASE_TABLET | Freq: Every day | ORAL | Status: DC
  Administered 2015-11-05 – 2015-11-07 (×3): 20 meq via ORAL
  Filled 2015-11-04 (×3): qty 1

## 2015-11-04 MED ORDER — HYDRALAZINE HCL 25 MG PO TABS
12.5000 mg | ORAL_TABLET | Freq: Three times a day (TID) | ORAL | Status: DC
  Administered 2015-11-04 – 2015-11-07 (×8): 12.5 mg via ORAL
  Filled 2015-11-04 (×8): qty 1

## 2015-11-04 MED ORDER — ASPIRIN 81 MG PO CHEW
81.0000 mg | CHEWABLE_TABLET | Freq: Every day | ORAL | Status: DC
  Administered 2015-11-04 – 2015-11-07 (×4): 81 mg via ORAL
  Filled 2015-11-04 (×4): qty 1

## 2015-11-04 MED ORDER — INSULIN ASPART 100 UNIT/ML ~~LOC~~ SOLN: 0.0000 [IU] | Freq: Every day | SUBCUTANEOUS | Status: DC

## 2015-11-04 MED ORDER — FUROSEMIDE 10 MG/ML IJ SOLN
60.0000 mg | Freq: Two times a day (BID) | INTRAMUSCULAR | Status: DC
  Administered 2015-11-04: 60 mg via INTRAVENOUS
  Filled 2015-11-04: qty 6

## 2015-11-04 NOTE — H&P (Signed)
[facility] History and Physical  [name] [id number] DOB: 19-Feb-1933 DOA: 11/04/2015  Referring physician: Winfred Leeds PCP: [name], MD   Chief Complaint: sob/LE edema/chest pain  HPI: [name] is a delightful 82 y.o. BF PMHx diabetes, anemia, hypertension, CAD, A. fib, chronic combined systolic and diastolic heart failure, nonischemic cardiomyopathy, lower extremity edema, CVA presents to emergency department chief complaint of persistent worsening dyspnea on exertion lower shin edema and intermittent chest pain. Initial evaluation in the emergency department reveals acute on chronic combined systolic and diastolic heart failure. States believes her dry weight is 150 pounds (68 kg)  Information is obtained from the patient is a poor historian as well as her daughters at the bedside and pertussis patent her care. Daughter reports over the last 2 weeks gradual worsening of leg swelling. She went and saw her PCP and diuretic was increased with little improvement. Daughter reports over the last 2 days she noticed a worsening dyspnea with exertion. Patient reports some intermittent left anterior chest pain last night. She describes as sharp pain that does not radiate. She denies any diaphoresis nausea vomiting during this episode. She states the episode was "didn't last long". No cough fever chills. She denies any abdominal pain diarrhea melena. She denies dysuria hematuria frequency or urgency.  In the emergency department she is afebrile hemodynamically stable and not hypoxic. She is provided with 60 mg Lasix IV and nitroglycerin paste  Review of Systems:  10 point review of systems complete and all systems are negative except as indicated in the history of present illness   Past Medical History  Diagnosis Date  . Asthma   . CAD (coronary artery disease)     Pt reports MI in 2006 (no documentation).  Cardiolite in 05/2002 and 07/2006 did not reveal any reversible  ischemia.  Pt follows with Dr. Rex Kras at [facility].  . CHF (congestive heart failure) (HCC)     EF 25-30% with dilated LV, mild LVH, severe hypokinesis, and mod-severe reduction in RV function  . Osteoporosis   . HYPERTENSION 08/03/2006  . GASTROPARESIS, DIABETIC 08/03/2006  . HYPERLIPIDEMIA 08/03/2006  . OBSTRUCTIVE SLEEP APNEA 01/06/2008  . PERIPHERAL NEUROPATHY 08/03/2006  . GERD 08/03/2006  . LOW BACK PAIN, CHRONIC 08/03/2006  . OSTEOPOROSIS 03/21/2009  . CEREBRAL EMBOLISM, WITH INFARCTION 07/02/2010  . Angina   . Myocardial infarction ([place]) "2 or 3"  . Pneumonia 02/26/12    "a few times; probably even today"  . DIABETES MELLITUS, TYPE II 11/04/1983  . Blood transfusion     multiple in 2012, 2015.   . GI bleed 08/2011 and 01/2014  . Chronic daily headache     migraines as well.   . Stroke [facility]) summer 2011    "made my left hip worse"  . Uterine cancer ([place])   . Nonischemic cardiomyopathy ([place]) 05/2010    Left heart catheterization:2011. Nonobstructive coronary artery disease.  . Pulmonary hypertension ([place])   . NSTEMI (non-ST elevated myocardial infarction) ([place]) 01/2014    type II with CVA  . Acute embolic stroke ([place]) 0000000  . Non-ST elevation MI (NSTEMI) ([place]) 02/04/2014  . E. coli UTI 03/02/2014  . Renal failure, acute ([place]) 02/04/2014  . Iron deficiency anemia secondary to blood loss (chronic) 01/06/2008    Qualifier: Diagnosis of  By: Tomasa Hosteller MD, Veronique D.   . Nonsustained ventricular tachycardia ([place])   . Angiodysplasia of intestine with hemorrhage 05/13/2014  . CKD (chronic kidney disease) stage 4, GFR 15-29 ml/min (HCC) 08/21/2014  Past Surgical History  Procedure Laterality Date  . Esophagogastroduodenoscopy  08/26/2011    Procedure: ESOPHAGOGASTRODUODENOSCOPY (EGD);  Surgeon: [name], MD;  Location: [facility] ENDOSCOPY;  Service: Endoscopy;  Laterality: N/A;  . Colonoscopy  08/28/2011    Procedure: COLONOSCOPY;  Surgeon: [name], MD;  Location: [place];   Service: Endoscopy;  Laterality: N/A;  . Vaginal hysterectomy    . Tubal ligation    . Cataract extraction w/ intraocular lens  implant, bilateral    . Toe surgery      "right big toe; operated on it to straighten it out; it was under"  . Polysomnogram  10/17/2005    AHI-7.28/hr. AHI REM-20.8/hr. Average oxygen saturation range during REM and NREM was 97%. Lowest oxygen saturation during REM sleep was 90%.  . Carotid duplex  05/28/2010    No significant extracranial carotid artery stenosis demonstrated. Vertebrals are patent w/ antegrade flow.  . Cardiac catheterization  05/24/2010    No intervention - recommed medical therapy.  . Cardiovascular stress test  08/07/2006    Moderate-severe defect seen in Basal inferior, Mid inferoseptal, Mid inferior, Mid inferolateral, and Apical inferior regions - consistent w/ infarct/scar. No scintigraphic evidence of inducible myocardial ischemia.  . Transthoracic echocardiogram  08/29/2011    EF 55-60%, moderate LVH,   . Esophagogastroduodenoscopy N/A 02/08/2014    Procedure: ESOPHAGOGASTRODUODENOSCOPY (EGD);  Surgeon: [name], MD;  Location: [facility] ENDOSCOPY;  Service: Endoscopy;  Laterality: N/A;  . Colonoscopy N/A 05/12/2014    Procedure: COLONOSCOPY;  Surgeon: [name], MD;  Location: [place];  Service: Endoscopy;  Laterality: N/A;  . Enteroscopy N/A 05/13/2014    Procedure: ENTEROSCOPY;  Surgeon: [name], MD;  Location: [place];  Service: Endoscopy;  Laterality: N/A;  . Enteroscopy N/A 07/13/2014    Procedure: small bowel enteroscopy;  Surgeon: Jerene Bears, MD;  Location: [facility] ENDOSCOPY;  Service: Endoscopy;  Laterality: N/A;  requests slim colonoscope   Social History:  reports that she has never smoked. She quit smokeless tobacco use about 9 years ago. Her smokeless tobacco use included Snuff. She reports that she does not drink alcohol or use illicit drugs.  Allergies  Allergen Reactions  . Baking Soda-Fluoride [Sodium  Fluoride] Nausea And Vomiting  . Magnesium Hydroxide Nausea And Vomiting    Family History  Problem Relation Age of Onset  . Diabetes insipidus Mother   . Hypertension Mother   . Hypertension Father   . Hypertension Sister   . Hypertension Child   . Stomach cancer Brother      Prior to Admission medications   Medication Sig Start Date End Date Taking? Authorizing Provider  acetaminophen (TYLENOL) 500 MG tablet Take 500 mg by mouth every 6 (six) hours as needed for moderate pain or headache.   Yes Historical Provider, MD  albuterol (PROAIR HFA) 108 (90 BASE) MCG/ACT inhaler Inhale 2 puffs into the lungs every 6 (six) hours as needed for wheezing. 05/25/14  Yes Daniel J Angiulli, PA-C  amLODipine (NORVASC) 5 MG tablet TAKE 1 TABLET EVERY DAY 09/17/15  Yes Lorretta Harp, MD  aspirin 81 MG tablet Take 81 mg by mouth daily.   Yes Historical Provider, MD  atorvastatin (LIPITOR) 40 MG tablet Take 1 tablet (40 mg total) by mouth daily at 6 PM. 05/25/14  Yes Lavon Paganini Angiulli, PA-C  hydrALAZINE (APRESOLINE) 25 MG tablet Take 0.5 tablets (12.5 mg total) by mouth every 8 (eight) hours. 02/19/15  Yes Isaiah Serge, NP  insulin glargine (LANTUS) 100 UNIT/ML injection Inject 0.05 mLs (5 Units total) into the skin 2 (two) times daily. 04/07/15  Yes Delfina Redwood, MD  isosorbide mononitrate (IMDUR) 60 MG 24 hr tablet Take 1 tablet (60 mg total) by mouth daily. 05/25/14  Yes Daniel J Angiulli, PA-C  KLOR-CON 10 10 MEQ tablet Take 20 mEq by mouth daily.  10/09/14  Yes Historical Provider, MD  loratadine (ALLERGY RELIEF) 10 MG tablet Take 10 mg by mouth daily.   Yes Historical Provider, MD  magnesium oxide (MAG-OX) 400 (241.3 MG) MG tablet Take 1 tablet (400 mg total) by mouth 2 (two) times daily. 05/25/14  Yes Daniel J Angiulli, PA-C  metoprolol succinate (TOPROL-XL) 25 MG 24 hr tablet TAKE 1 TABLET (25 MG TOTAL) BY MOUTH DAILY. 08/15/15  Yes Lorretta Harp, MD  omeprazole (PRILOSEC) 20 MG capsule Take 1  capsule (20 mg total) by mouth daily before breakfast. 05/25/14  Yes Lavon Paganini Angiulli, PA-C  polyethylene glycol (MIRALAX / GLYCOLAX) packet Take 17 g by mouth daily as needed.    Yes Historical Provider, MD  torsemide (DEMADEX) 10 MG tablet Take 20 mg by mouth daily.    Yes Historical Provider, MD   Physical Exam: Filed Vitals:   11/04/15 1315 11/04/15 1330 11/04/15 1515 11/04/15 1700  BP: 150/74 152/74 147/79 153/67  Pulse: 63 61 66 66  Temp:    98.6 F (37 C)  Resp: 14 22 22    Height:    5\' 2"  (1.575 m)  Weight:    66.951 kg (147 lb 9.6 oz)  SpO2: 100% 100% 97%     Wt Readings from Last 3 Encounters:  11/04/15 66.951 kg (147 lb 9.6 oz)  09/14/15 66.225 kg (146 lb)  07/26/15 66.452 kg (146 lb 8 oz)    General:  Appears calm and comfortable, somewhat frail and chronically ill Eyes: PERRL, normal lids, irises & conjunctiva ENT: grossly normal hearing, weakness membranes of her mouth [place] pain Neck: no LAD, masses or thyromegaly Cardiovascular: RRR, no m/r/g. 2-3+ bilateral lower extremity to hips. Respiratory:  Normal respiratory effort. Breath sounds somewhat distant with faint fine crackles bilateral bases Abdomen: soft, ntnd positive bowel sounds Skin: no rash or induration seen on limited exam Musculoskeletal: grossly normal tone BUE/BLE joints without swelling/erythema Psychiatric: grossly normal mood and affect, speech fluent and appropriate Neurologic: grossly non-focal. somewhat hard of hearing speech clear oriented to self and place follows commands able to make wants and needs known           Labs on Admission:  Basic Metabolic Panel:  Recent Labs Lab 11/04/15 1350  NA 143  K 4.9  CL 109  CO2 25  GLUCOSE 125*  BUN 27*  CREATININE 1.74*  CALCIUM 9.1   Liver Function Tests: No results for input(s): AST, ALT, ALKPHOS, BILITOT, PROT, ALBUMIN in the last 168 hours. No results for input(s): LIPASE, AMYLASE in the last 168 hours. No results for input(s):  AMMONIA in the last 168 hours. CBC:  Recent Labs Lab 11/04/15 1350  WBC 4.1  NEUTROABS 2.7  HGB 11.2*  HCT 34.9*  MCV 85.1  PLT 235   Cardiac Enzymes: No results for input(s): CKTOTAL, CKMB, CKMBINDEX, TROPONINI in the last 168 hours.  BNP (last 3 results)  Recent Labs  04/04/15 1100 11/04/15 1350  BNP 1383.6* 1841.7*    ProBNP (last 3 results) No results for input(s): PROBNP in the last 8760 hours.  CBG:  Recent Labs Lab 11/04/15 1721  GLUCAP 170*    Radiological Exams on Admission: Dg Chest 2 View  11/04/2015  CLINICAL DATA:  Left-sided chest pain. History of nonischemic cardiomyopathy and congestive heart failure. EXAM: CHEST - 2 VIEW COMPARISON:  04/04/2015 FINDINGS: The heart is diffusely enlarged. There is evidence of congestive heart failure with diffuse interstitial edema present. There likely are small bilateral pleural effusions. No focal airspace disease or pneumothorax identified. The bony thorax is unremarkable. IMPRESSION: Significant cardiomegaly with evidence of CHF/interstitial edema and small bilateral pleural effusions. Electronically Signed   By: Aletta Edouard M.D.   On: 11/04/2015 14:39    EKG: Independently reviewed. Sinus rhythm borderline prolonged PR  Assessment/Plan Principal Problem:   Acute on chronic combined systolic and diastolic heart failure (HCC) Active Problems:   DM (diabetes mellitus), type 2, uncontrolled (HCC)   Iron deficiency anemia secondary to blood loss (chronic)   Essential hypertension   GERD   CAD (coronary artery disease)   Memory difficulties   Atrial fibrillation (HCC)   Dyspnea   Chest pain   Diabetes mellitus due to underlying condition with hyperosmolarity without coma, without long-term current use of insulin (HCC)   Paroxysmal atrial fibrillation (HCC)   CKD (chronic kidney disease)  #1. Acute on chronic combined systolic diastolic heart failure. Unclear. Reports compliance with medications and diet.  Most recent echo in 2015 reveals an EF of 99991111 grade 2 diastolic dysfunction issues hypokinesis. Chest x-ray evidence of CHF/interstitial edema swells bilateral pleural effusions. BNP 1841, initial troponin 0.04. -Admit to telemetry -IV Lasix 60 mg TID -Hold home Demadex -Monitor intake and output since admission -373ml -Daily weights admission weight standing= 66.9 kg -Continue home beta blocker -Echocardiogram  #2. Chest pain at rest. Heart score 5. Typical and atypical features. Initial troponin 0.04. KG without acute changes. -We'll cycle troponins -Obtain serial EKGs -Echocardiogram -Lipid panel -Continue home meds  #3. Diabetes. Appears controlled. -Hemoglobin A1c -Sliding scale insulin -Home Lantus at lower dose  #4. A. Fib/history of stroke. Chadvasc score 4. Not a candidate for anticoagulation. Ambulates with a walker -Currently in NSR  #5. Hypertension. Controlled. -continuie home amlodipine, imdur -Monitor  6. Iron deficiency anemia. Chronic. Hemoglobin on admission 11.2. -Stable at baseline -Monitor  CKD (baseline Cr ~2.15) -Currently patient's creatinine better than baseline  Code Status: dnr DVT Prophylaxis: Family Communication: daughter at bedside Disposition Plan: home when ready  Time spent: 65 minutes  Josephine during the described time interval was provided by me . I have reviewed this patient's available data, including medical history, events of note, physical examination, and all test results as part of my evaluation. I have personally reviewed and interpreted all radiology studies. I have discussed the A&P with NP [facility] and agree with above plan.

## 2015-11-04 NOTE — ED Notes (Signed)
Patient transported to X-ray 

## 2015-11-04 NOTE — ED Notes (Signed)
Per EMS-pt here from home c.o. New onset chest pain/shortness of breath starting last night that is sharp on the left side radiating to left arm and left leg. 324 ASA given. No nitro given no IV started. Pt has pedal edema.  No chest pain currently.  CBG 177. 98% RA. Shortness of breath resolved with 2L O2 via [place].

## 2015-11-04 NOTE — ED Provider Notes (Signed)
CSN: [id number]     Arrival date & time 11/04/15  1243 History   None    Chief Complaint  Patient presents with  . Chest Pain   Patient is vague historian  (Consider location/radiation/quality/duration/timing/severity/associated sxs/prior Treatment) HPI Complains of anterior chest pain left-sided nonradiating onset last night. Intermittent however she cannot describe time interval associated by shortness of breath. Patient's daughter also adds that she has had worsening leg swelling over the past 2 weeks. She was treated by Dr.Hill with increasing diuretic dose however advised that leg swelling did not get better she should get to the hospital. She is presently asymptomatic. Treated with aspirin prior to coming here. No other associated symptoms. Nothing makes symptoms better or worse.Marland Kitchen She was treated with aspirin prior to coming here today. Past Medical History  Diagnosis Date  . Asthma   . CAD (coronary artery disease)     Pt reports MI in 2006 (no documentation).  Cardiolite in 05/2002 and 07/2006 did not reveal any reversible ischemia.  Pt follows with Dr. Rex Kras at [facility].  . CHF (congestive heart failure) (HCC)     EF 25-30% with dilated LV, mild LVH, severe hypokinesis, and mod-severe reduction in RV function  . Osteoporosis   . HYPERTENSION 08/03/2006  . GASTROPARESIS, DIABETIC 08/03/2006  . HYPERLIPIDEMIA 08/03/2006  . OBSTRUCTIVE SLEEP APNEA 01/06/2008  . PERIPHERAL NEUROPATHY 08/03/2006  . GERD 08/03/2006  . LOW BACK PAIN, CHRONIC 08/03/2006  . OSTEOPOROSIS 03/21/2009  . CEREBRAL EMBOLISM, WITH INFARCTION 07/02/2010  . Angina   . Myocardial infarction ([place]) "2 or 3"  . Pneumonia 02/26/12    "a few times; probably even today"  . DIABETES MELLITUS, TYPE II 11/04/1983  . Blood transfusion     multiple in 2012, 2015.   . GI bleed 08/2011 and 01/2014  . Chronic daily headache     migraines as well.   . Stroke [facility]) summer 2011    "made my left hip worse"  . Uterine cancer ([place])    . Nonischemic cardiomyopathy ([place]) 05/2010    Left heart catheterization:2011. Nonobstructive coronary artery disease.  . Pulmonary hypertension ([place])   . NSTEMI (non-ST elevated myocardial infarction) ([place]) 01/2014    type II with CVA  . Acute embolic stroke ([place]) 0000000  . Non-ST elevation MI (NSTEMI) ([place]) 02/04/2014  . E. coli UTI 03/02/2014  . Renal failure, acute ([place]) 02/04/2014  . Iron deficiency anemia secondary to blood loss (chronic) 01/06/2008    Qualifier: Diagnosis of  By: Tomasa Hosteller MD, Veronique D.   . Nonsustained ventricular tachycardia ([place])   . Angiodysplasia of intestine with hemorrhage 05/13/2014  . CKD (chronic kidney disease) stage 4, GFR 15-29 ml/min (HCC) 08/21/2014   Past Surgical History  Procedure Laterality Date  . Esophagogastroduodenoscopy  08/26/2011    Procedure: ESOPHAGOGASTRODUODENOSCOPY (EGD);  Surgeon: [name], MD;  Location: [facility] ENDOSCOPY;  Service: Endoscopy;  Laterality: N/A;  . Colonoscopy  08/28/2011    Procedure: COLONOSCOPY;  Surgeon: [name], MD;  Location: [place];  Service: Endoscopy;  Laterality: N/A;  . Vaginal hysterectomy    . Tubal ligation    . Cataract extraction w/ intraocular lens  implant, bilateral    . Toe surgery      "right big toe; operated on it to straighten it out; it was under"  . Polysomnogram  10/17/2005    AHI-7.28/hr. AHI REM-20.8/hr. Average oxygen saturation range during REM and NREM was 97%. Lowest oxygen saturation during REM sleep was 90%.  Marland Kitchen  Carotid duplex  05/28/2010    No significant extracranial carotid artery stenosis demonstrated. Vertebrals are patent w/ antegrade flow.  . Cardiac catheterization  05/24/2010    No intervention - recommed medical therapy.  . Cardiovascular stress test  08/07/2006    Moderate-severe defect seen in Basal inferior, Mid inferoseptal, Mid inferior, Mid inferolateral, and Apical inferior regions - consistent w/ infarct/scar. No scintigraphic evidence of inducible  myocardial ischemia.  . Transthoracic echocardiogram  08/29/2011    EF 55-60%, moderate LVH,   . Esophagogastroduodenoscopy N/A 02/08/2014    Procedure: ESOPHAGOGASTRODUODENOSCOPY (EGD);  Surgeon: [name], MD;  Location: [facility] ENDOSCOPY;  Service: Endoscopy;  Laterality: N/A;  . Colonoscopy N/A 05/12/2014    Procedure: COLONOSCOPY;  Surgeon: [name], MD;  Location: [place];  Service: Endoscopy;  Laterality: N/A;  . Enteroscopy N/A 05/13/2014    Procedure: ENTEROSCOPY;  Surgeon: [name], MD;  Location: [place];  Service: Endoscopy;  Laterality: N/A;  . Enteroscopy N/A 07/13/2014    Procedure: small bowel enteroscopy;  Surgeon: Jerene Bears, MD;  Location: [facility] ENDOSCOPY;  Service: Endoscopy;  Laterality: N/A;  requests slim colonoscope   Family History  Problem Relation Age of Onset  . Diabetes insipidus Mother   . Hypertension Mother   . Hypertension Father   . Hypertension Sister   . Hypertension Child   . Stomach cancer Brother    Social History  Substance Use Topics  . Smoking status: Never Smoker   . Smokeless tobacco: Former Systems developer    Types: Snuff    Quit date: 10/13/2006     Comment: 02/26/12 "stopped snuff 4-6 years ago"  . Alcohol Use: No     Comment: "stopped drinking alcohol ~ 1980's"   OB History    No data available     Review of Systems  Constitutional: Negative.   HENT: Negative.   Respiratory: Positive for shortness of breath.   Cardiovascular: Positive for chest pain and leg swelling.  Gastrointestinal: Negative.   Musculoskeletal: Negative.   Skin: Negative.   Allergic/Immunologic: Positive for immunocompromised state.       Diabetic  Neurological: Negative.   Psychiatric/Behavioral: Negative.   All other systems reviewed and are negative.     Allergies  Baking soda-fluoride and Magnesium hydroxide  Home Medications   Prior to Admission medications   Medication Sig Start Date End Date Taking? Authorizing Provider   acetaminophen (TYLENOL) 500 MG tablet Take 500 mg by mouth every 6 (six) hours as needed for moderate pain or headache.    Historical Provider, MD  albuterol (PROAIR HFA) 108 (90 BASE) MCG/ACT inhaler Inhale 2 puffs into the lungs every 6 (six) hours as needed for wheezing. 05/25/14   Lavon Paganini Angiulli, PA-C  amLODipine (NORVASC) 5 MG tablet TAKE 1 TABLET EVERY DAY 09/17/15   Lorretta Harp, MD  aspirin 81 MG tablet Take 81 mg by mouth daily.    Historical Provider, MD  atorvastatin (LIPITOR) 40 MG tablet Take 1 tablet (40 mg total) by mouth daily at 6 PM. 05/25/14   Lavon Paganini Angiulli, PA-C  hydrALAZINE (APRESOLINE) 25 MG tablet Take 0.5 tablets (12.5 mg total) by mouth every 8 (eight) hours. 02/19/15   Isaiah Serge, NP  insulin glargine (LANTUS) 100 UNIT/ML injection Inject 0.05 mLs (5 Units total) into the skin 2 (two) times daily. 04/07/15   Delfina Redwood, MD  isosorbide mononitrate (IMDUR) 60 MG 24 hr tablet Take 1 tablet (60 mg total) by mouth  daily. 05/25/14   Lavon Paganini Angiulli, PA-C  KLOR-CON 10 10 MEQ tablet Take 20 mEq by mouth daily.  10/09/14   Historical Provider, MD  LANTUS SOLOSTAR 100 UNIT/ML Solostar Pen Inject 14 Units into the skin 2 (two) times daily. 04/24/15   Historical Provider, MD  loratadine (ALLERGY RELIEF) 10 MG tablet Take 10 mg by mouth daily.    Historical Provider, MD  magnesium oxide (MAG-OX) 400 (241.3 MG) MG tablet Take 1 tablet (400 mg total) by mouth 2 (two) times daily. 05/25/14   Lavon Paganini Angiulli, PA-C  metoprolol succinate (TOPROL-XL) 25 MG 24 hr tablet TAKE 1 TABLET (25 MG TOTAL) BY MOUTH DAILY. 08/15/15   Lorretta Harp, MD  omeprazole (PRILOSEC) 20 MG capsule Take 1 capsule (20 mg total) by mouth daily before breakfast. 05/25/14   Lavon Paganini Angiulli, PA-C  omeprazole (PRILOSEC) 20 MG capsule TAKE 1 CAPSULE (20 MG TOTAL) BY MOUTH DAILY BEFORE BREAKFAST. 09/17/15   [name], MD  polyethylene glycol [facility] Floria Raveling) packet Take 17 g by mouth daily.     Historical Provider, MD  torsemide (DEMADEX) 10 MG tablet Take 20 mg by mouth daily.     Historical Provider, MD   BP 149/73 mmHg  Temp(Src) 98.3 F (36.8 C)  Resp 22  SpO2 95%  LMP 12/19/1968 Physical Exam  Constitutional: She appears well-developed and well-nourished.  HENT:  Head: Normocephalic and atraumatic.  Eyes: Conjunctivae are normal. Pupils are equal, round, and reactive to light.  Neck: Neck supple. No tracheal deviation present. No thyromegaly present.  Cardiovascular: Normal rate and regular rhythm.   No murmur heard. Pulmonary/Chest: Effort normal and breath sounds normal.  Abdominal: Soft. Bowel sounds are normal. She exhibits no distension. There is no tenderness.  Musculoskeletal: Normal range of motion. She exhibits edema. She exhibits no tenderness.  3+ pretibial pitting edema bilaterally  Neurological: She is alert. Coordination normal.  Skin: Skin is warm and dry. No rash noted.  Psychiatric: She has a normal mood and affect.  Nursing note and vitals reviewed.   ED Course  Procedures (including critical care time) Labs Review Labs Reviewed  BASIC METABOLIC PANEL  CBC WITH DIFFERENTIAL/PLATELET  I-STAT TROPOININ, ED    Imaging Review No results found. I have personally reviewed and evaluated these images and lab results as part of my medical decision-making.   EKG Interpretation   Date/Time:  Sunday November 04 2015 12:56:33 EST Ventricular Rate:  65 PR Interval:  212 QRS Duration: 156 QT Interval:  507 QTC Calculation: 527 R Axis:   -91 Text Interpretation:  Sinus rhythm Borderline prolonged PR interval  Nonspecific IVCD with LAD Consider anterior infarct No significant change  since last tracing Confirmed by Tu Shimmel  MD, Brandin Dilday [phone]) on 11/04/2015  1:27:25 PM     Intravenous Lasix and topical nitrates ordered by me Chest x-ray viewed by me. Results for orders placed or performed during the hospital encounter of [id number]  Basic  metabolic panel  Result Value Ref Range   Sodium 143 135 - 145 mmol/L   Potassium 4.9 3.5 - 5.1 mmol/L   Chloride 109 101 - 111 mmol/L   CO2 25 22 - 32 mmol/L   Glucose, Bld 125 (H) 65 - 99 mg/dL   BUN 27 (H) 6 - 20 mg/dL   Creatinine, Ser 1.74 (H) 0.44 - 1.00 mg/dL   Calcium 9.1 8.9 - 10.3 mg/dL   GFR calc non Af Amer 27 (L) >60 mL/min   GFR  calc Af Amer 31 (L) >60 mL/min   Anion gap 9 5 - 15  CBC with Differential  Result Value Ref Range   WBC 4.1 4.0 - 10.5 K/uL   RBC 4.10 3.87 - 5.11 MIL/uL   Hemoglobin 11.2 (L) 12.0 - 15.0 g/dL   HCT 34.9 (L) 36.0 - 46.0 %   MCV 85.1 78.0 - 100.0 fL   MCH 27.3 26.0 - 34.0 pg   MCHC 32.1 30.0 - 36.0 g/dL   RDW 19.8 (H) 11.5 - 15.5 %   Platelets 235 150 - 400 K/uL   Neutrophils Relative % 65 %   Neutro Abs 2.7 1.7 - 7.7 K/uL   Lymphocytes Relative 24 %   Lymphs Abs 1.0 0.7 - 4.0 K/uL   Monocytes Relative 9 %   Monocytes Absolute 0.4 0.1 - 1.0 K/uL   Eosinophils Relative 1 %   Eosinophils Absolute 0.0 0.0 - 0.7 K/uL   Basophils Relative 1 %   Basophils Absolute 0.0 0.0 - 0.1 K/uL  Brain natriuretic peptide  Result Value Ref Range   B Natriuretic Peptide 1841.7 (H) 0.0 - 100.0 pg/mL  I-stat troponin, ED (not at [facility], [facility])  Result Value Ref Range   Troponin i, poc 0.04 0.00 - 0.08 ng/mL   Comment 3           Dg Chest 2 View  11/04/2015  CLINICAL DATA:  Left-sided chest pain. History of nonischemic cardiomyopathy and congestive heart failure. EXAM: CHEST - 2 VIEW COMPARISON:  04/04/2015 FINDINGS: The heart is diffusely enlarged. There is evidence of congestive heart failure with diffuse interstitial edema present. There likely are small bilateral pleural effusions. No focal airspace disease or pneumothorax identified. The bony thorax is unremarkable. IMPRESSION: Significant cardiomegaly with evidence of CHF/interstitial edema and small bilateral pleural effusions. Electronically Signed   By: Aletta Edouard M.D.   On: 11/04/2015 14:39     MDM  Hospitalist service consulted on case. I spoke with Ms. Renard Hamper,  NP plan 23 hour observation to telemetry. Final diagnoses:  None   Renal insufficiency is chronic.anemia is chronic. Dx #1 congestive heart failure #2 chest pain #3renal insufficiency #4 anemia     Orlie Dakin, MD 11/04/15 [phone]

## 2015-11-05 ENCOUNTER — Encounter (HOSPITAL_COMMUNITY): Payer: Self-pay | Admitting: *Deleted

## 2015-11-05 ENCOUNTER — Observation Stay (HOSPITAL_BASED_OUTPATIENT_CLINIC_OR_DEPARTMENT_OTHER): Payer: Commercial Managed Care - HMO

## 2015-11-05 DIAGNOSIS — E1165 Type 2 diabetes mellitus with hyperglycemia: Secondary | ICD-10-CM | POA: Diagnosis present

## 2015-11-05 DIAGNOSIS — I340 Nonrheumatic mitral (valve) insufficiency: Secondary | ICD-10-CM | POA: Diagnosis not present

## 2015-11-05 DIAGNOSIS — R0600 Dyspnea, unspecified: Secondary | ICD-10-CM

## 2015-11-05 DIAGNOSIS — Z794 Long term (current) use of insulin: Secondary | ICD-10-CM | POA: Diagnosis not present

## 2015-11-05 DIAGNOSIS — Z66 Do not resuscitate: Secondary | ICD-10-CM | POA: Diagnosis present

## 2015-11-05 DIAGNOSIS — E0800 Diabetes mellitus due to underlying condition with hyperosmolarity without nonketotic hyperglycemic-hyperosmolar coma (NKHHC): Secondary | ICD-10-CM | POA: Diagnosis not present

## 2015-11-05 DIAGNOSIS — Z9842 Cataract extraction status, left eye: Secondary | ICD-10-CM | POA: Diagnosis not present

## 2015-11-05 DIAGNOSIS — I4891 Unspecified atrial fibrillation: Secondary | ICD-10-CM | POA: Diagnosis not present

## 2015-11-05 DIAGNOSIS — E785 Hyperlipidemia, unspecified: Secondary | ICD-10-CM | POA: Diagnosis present

## 2015-11-05 DIAGNOSIS — N184 Chronic kidney disease, stage 4 (severe): Secondary | ICD-10-CM | POA: Diagnosis present

## 2015-11-05 DIAGNOSIS — Z79899 Other long term (current) drug therapy: Secondary | ICD-10-CM | POA: Diagnosis not present

## 2015-11-05 DIAGNOSIS — I5043 Acute on chronic combined systolic (congestive) and diastolic (congestive) heart failure: Secondary | ICD-10-CM | POA: Diagnosis not present

## 2015-11-05 DIAGNOSIS — I429 Cardiomyopathy, unspecified: Secondary | ICD-10-CM | POA: Diagnosis present

## 2015-11-05 DIAGNOSIS — K219 Gastro-esophageal reflux disease without esophagitis: Secondary | ICD-10-CM | POA: Diagnosis present

## 2015-11-05 DIAGNOSIS — E1122 Type 2 diabetes mellitus with diabetic chronic kidney disease: Secondary | ICD-10-CM | POA: Diagnosis present

## 2015-11-05 DIAGNOSIS — I482 Chronic atrial fibrillation: Secondary | ICD-10-CM | POA: Diagnosis not present

## 2015-11-05 DIAGNOSIS — N183 Chronic kidney disease, stage 3 (moderate): Secondary | ICD-10-CM | POA: Diagnosis not present

## 2015-11-05 DIAGNOSIS — E1143 Type 2 diabetes mellitus with diabetic autonomic (poly)neuropathy: Secondary | ICD-10-CM | POA: Diagnosis present

## 2015-11-05 DIAGNOSIS — J45909 Unspecified asthma, uncomplicated: Secondary | ICD-10-CM | POA: Diagnosis present

## 2015-11-05 DIAGNOSIS — Z961 Presence of intraocular lens: Secondary | ICD-10-CM | POA: Diagnosis present

## 2015-11-05 DIAGNOSIS — I481 Persistent atrial fibrillation: Secondary | ICD-10-CM | POA: Diagnosis not present

## 2015-11-05 DIAGNOSIS — I071 Rheumatic tricuspid insufficiency: Secondary | ICD-10-CM | POA: Diagnosis present

## 2015-11-05 DIAGNOSIS — I130 Hypertensive heart and chronic kidney disease with heart failure and stage 1 through stage 4 chronic kidney disease, or unspecified chronic kidney disease: Secondary | ICD-10-CM | POA: Diagnosis present

## 2015-11-05 DIAGNOSIS — Z8542 Personal history of malignant neoplasm of other parts of uterus: Secondary | ICD-10-CM | POA: Diagnosis not present

## 2015-11-05 DIAGNOSIS — I69354 Hemiplegia and hemiparesis following cerebral infarction affecting left non-dominant side: Secondary | ICD-10-CM | POA: Diagnosis not present

## 2015-11-05 DIAGNOSIS — Z7982 Long term (current) use of aspirin: Secondary | ICD-10-CM | POA: Diagnosis not present

## 2015-11-05 DIAGNOSIS — E870 Hyperosmolality and hypernatremia: Secondary | ICD-10-CM | POA: Diagnosis present

## 2015-11-05 DIAGNOSIS — I5023 Acute on chronic systolic (congestive) heart failure: Secondary | ICD-10-CM | POA: Diagnosis present

## 2015-11-05 DIAGNOSIS — M810 Age-related osteoporosis without current pathological fracture: Secondary | ICD-10-CM | POA: Diagnosis present

## 2015-11-05 DIAGNOSIS — I2510 Atherosclerotic heart disease of native coronary artery without angina pectoris: Secondary | ICD-10-CM | POA: Diagnosis present

## 2015-11-05 DIAGNOSIS — Z8249 Family history of ischemic heart disease and other diseases of the circulatory system: Secondary | ICD-10-CM | POA: Diagnosis not present

## 2015-11-05 DIAGNOSIS — I252 Old myocardial infarction: Secondary | ICD-10-CM | POA: Diagnosis not present

## 2015-11-05 DIAGNOSIS — D500 Iron deficiency anemia secondary to blood loss (chronic): Secondary | ICD-10-CM | POA: Diagnosis present

## 2015-11-05 DIAGNOSIS — Z9841 Cataract extraction status, right eye: Secondary | ICD-10-CM | POA: Diagnosis not present

## 2015-11-05 LAB — GLUCOSE, CAPILLARY
GLUCOSE-CAPILLARY: 86 mg/dL (ref 65–99)
GLUCOSE-CAPILLARY: 137 mg/dL — AB (ref 65–99)
GLUCOSE-CAPILLARY: 211 mg/dL — AB (ref 65–99)
Glucose-Capillary: 158 mg/dL — ABNORMAL HIGH (ref 65–99)

## 2015-11-05 LAB — CBC
HCT: 31.8 % — ABNORMAL LOW (ref 36.0–46.0)
Hemoglobin: 10.2 g/dL — ABNORMAL LOW (ref 12.0–15.0)
MCH: 27.3 pg (ref 26.0–34.0)
MCHC: 32.1 g/dL (ref 30.0–36.0)
MCV: 85.3 fL (ref 78.0–100.0)
PLATELETS: 216 10*3/uL (ref 150–400)
RBC: 3.73 MIL/uL — AB (ref 3.87–5.11)
RDW: 19.5 % — AB (ref 11.5–15.5)
WBC: 4 10*3/uL (ref 4.0–10.5)

## 2015-11-05 LAB — BASIC METABOLIC PANEL
Anion gap: 10 (ref 5–15)
BUN: 26 mg/dL — AB (ref 6–20)
CHLORIDE: 107 mmol/L (ref 101–111)
CO2: 25 mmol/L (ref 22–32)
CREATININE: 1.76 mg/dL — AB (ref 0.44–1.00)
Calcium: 8.8 mg/dL — ABNORMAL LOW (ref 8.9–10.3)
GFR, EST AFRICAN AMERICAN: 30 mL/min — AB (ref >60)
GFR, EST NON AFRICAN AMERICAN: 26 mL/min — AB (ref >60)
Glucose, Bld: 94 mg/dL (ref 65–99)
Potassium: 3.8 mmol/L (ref 3.5–5.1)
SODIUM: 142 mmol/L (ref 135–145)

## 2015-11-05 LAB — LIPID PANEL
CHOLESTEROL: 114 mg/dL (ref 0–200)
HDL: 60 mg/dL (ref >40)
LDL Cholesterol: 43 mg/dL (ref 0–99)
Total CHOL/HDL Ratio: 1.9 RATIO
Triglycerides: 57 mg/dL (ref <150)
VLDL: 11 mg/dL (ref 0–40)

## 2015-11-05 LAB — TROPONIN I: TROPONIN I: 0.04 ng/mL — AB (ref <0.031)

## 2015-11-05 MED ORDER — ALBUTEROL SULFATE (2.5 MG/3ML) 0.083% IN NEBU
2.5000 mg | INHALATION_SOLUTION | Freq: Two times a day (BID) | RESPIRATORY_TRACT | Status: DC
  Administered 2015-11-06 – 2015-11-07 (×3): 2.5 mg via RESPIRATORY_TRACT
  Filled 2015-11-05 (×3): qty 3

## 2015-11-05 MED ORDER — ALBUTEROL SULFATE (2.5 MG/3ML) 0.083% IN NEBU: 2.5000 mg | INHALATION_SOLUTION | RESPIRATORY_TRACT | Status: DC | PRN

## 2015-11-05 NOTE — Consult Note (Signed)
CARDIOLOGY CONSULT NOTE   Patient ID: LILIEN MARKSBERRY MRN: [id number], DOB/AGE: [birth year]   Admit date: 11/04/2015 Date of Consult: 11/05/2015   Primary Physician: [name], MD Primary Cardiologist: Dr. Gwenlyn Found  Pt. Profile  Mrs. Krieger is a pleasant 82 year old African-American female with past medical history of HTN, HLD, DM, CKD stage III, history of stroke, and a history of nonischemic cardiomyopathy presented with acute on chronic combined HF and chest pain  Problem List  Past Medical History  Diagnosis Date  . Asthma   . CAD (coronary artery disease)     Pt reports MI in 2006 (no documentation).  Cardiolite in 05/2002 and 07/2006 did not reveal any reversible ischemia.  Pt follows with Dr. Rex Kras at [facility].  . CHF (congestive heart failure) (HCC)     EF 25-30% with dilated LV, mild LVH, severe hypokinesis, and mod-severe reduction in RV function  . Osteoporosis   . HYPERTENSION 08/03/2006  . GASTROPARESIS, DIABETIC 08/03/2006  . HYPERLIPIDEMIA 08/03/2006  . OBSTRUCTIVE SLEEP APNEA 01/06/2008  . PERIPHERAL NEUROPATHY 08/03/2006  . GERD 08/03/2006  . LOW BACK PAIN, CHRONIC 08/03/2006  . OSTEOPOROSIS 03/21/2009  . CEREBRAL EMBOLISM, WITH INFARCTION 07/02/2010  . Angina   . Myocardial infarction ([place]) "2 or 3"  . Pneumonia 02/26/12    "a few times; probably even today"  . DIABETES MELLITUS, TYPE II 11/04/1983  . Blood transfusion     multiple in 2012, 2015.   . GI bleed 08/2011 and 01/2014  . Chronic daily headache     migraines as well.   . Stroke [facility]) summer 2011    "made my left hip worse"  . Uterine cancer ([place])   . Nonischemic cardiomyopathy ([place]) 05/2010    Left heart catheterization:2011. Nonobstructive coronary artery disease.  . Pulmonary hypertension ([place])   . NSTEMI (non-ST elevated myocardial infarction) ([place]) 01/2014    type II with CVA  . Acute embolic stroke ([place]) 0000000  . Non-ST elevation MI (NSTEMI) ([place]) 02/04/2014  . E. coli UTI 03/02/2014  .  Renal failure, acute ([place]) 02/04/2014  . Iron deficiency anemia secondary to blood loss (chronic) 01/06/2008    Qualifier: Diagnosis of  By: Tomasa Hosteller MD, Veronique D.   . Nonsustained ventricular tachycardia ([place])   . Angiodysplasia of intestine with hemorrhage 05/13/2014  . CKD (chronic kidney disease) stage 4, GFR 15-29 ml/min (HCC) 08/21/2014    Past Surgical History  Procedure Laterality Date  . Esophagogastroduodenoscopy  08/26/2011    Procedure: ESOPHAGOGASTRODUODENOSCOPY (EGD);  Surgeon: [name], MD;  Location: [facility] ENDOSCOPY;  Service: Endoscopy;  Laterality: N/A;  . Colonoscopy  08/28/2011    Procedure: COLONOSCOPY;  Surgeon: [name], MD;  Location: [place];  Service: Endoscopy;  Laterality: N/A;  . Vaginal hysterectomy    . Tubal ligation    . Cataract extraction w/ intraocular lens  implant, bilateral    . Toe surgery      "right big toe; operated on it to straighten it out; it was under"  . Polysomnogram  10/17/2005    AHI-7.28/hr. AHI REM-20.8/hr. Average oxygen saturation range during REM and NREM was 97%. Lowest oxygen saturation during REM sleep was 90%.  . Carotid duplex  05/28/2010    No significant extracranial carotid artery stenosis demonstrated. Vertebrals are patent w/ antegrade flow.  . Cardiac catheterization  05/24/2010    No intervention - recommed medical therapy.  . Cardiovascular stress test  08/07/2006    Moderate-severe defect seen in Basal inferior, Mid  inferoseptal, Mid inferior, Mid inferolateral, and Apical inferior regions - consistent w/ infarct/scar. No scintigraphic evidence of inducible myocardial ischemia.  . Transthoracic echocardiogram  08/29/2011    EF 55-60%, moderate LVH,   . Esophagogastroduodenoscopy N/A 02/08/2014    Procedure: ESOPHAGOGASTRODUODENOSCOPY (EGD);  Surgeon: [name], MD;  Location[facility] ENDOSCOPY;  Service: Endoscopy;  Laterality: N/A;  . Colonoscopy N/A 05/12/2014    Procedure: COLONOSCOPY;  Surgeon: [name], MD;  Location: [place];  Service: Endoscopy;  Laterality: N/A;  . Enteroscopy N/A 05/13/2014    Procedure: ENTEROSCOPY;  Surgeon: [name], MD;  Location: [place];  Service: Endoscopy;  Laterality: N/A;  . Enteroscopy N/A 07/13/2014    Procedure: small bowel enteroscopy;  Surgeon: Jerene Bears, MD;  Location: [facility] ENDOSCOPY;  Service: Endoscopy;  Laterality: N/A;  requests slim colonoscope     Allergies  Allergies  Allergen Reactions  . Baking Soda-Fluoride [Sodium Fluoride] Nausea And Vomiting  . Magnesium Hydroxide Nausea And Vomiting    HPI   Mrs. Giudici is a pleasant 82 year old African-American female with past medical history of HTN, HLD, DM, CKD stage III, history of stroke, and a history of nonischemic cardiomyopathy. Per internal medicine service note, patient also carries a previous diagnosis of A. fib, however I cannot find any evidence of this. She did have a cardiac catheterization in August 2011 which showed 40-50% mid LAD, 60% ostial OM 2, otherwise nonobstructive CAD, 2-3+ MR. Her previous echocardiogram obtained on 11/80/2015 showed EF 30-35%, diffuse hypokinesis, grade 2 diastolic dysfunction, mild LVH, moderate tricuspid regurg, severe MR , PA peak pressure 51 mmHg. Patient was last seen by Dr. Alvester Chou in the clinic on 09/14/2015, at which time she was doing well, denies any chest discomfort or shortness of breath. It was noted she has history of stroke with chronic L sided weakness. Her last episode of stroke was in April 2016. She had NSTEMI during the same hospitalization. Consideration was given to put her on systemic anticoagulation therapy (this does not appear to have been done). Event monitor was obtained however did not show atrial fibrillation, she did have short bursts of nonsustained VT.  For the past several days, patient has been noticing increasing lower extremity edema, orthopnea and paroxysmal nocturnal dyspnea. She is still compliant with her  diuretic at home. She also admits to have some degree of fever or chill, however she did not measure her temperature at home. She sat been having intermittent left-sided chest discomfort which is relieved after white foamy productive cough. She eventually sought medical attention at [facility] on 11/03/2014. Initial significant laboratory finding include BNP 1841, creatinine 1.74, hemoglobin 11.2. Chest x-ray showed evidence of CHF/interstitial edema and a small bilateral pleural effusion. EKG showed normal sinus rhythm with left bundle branch block. Patient has been placed on IV diuresis. Cardiology has been consulted for chest pain. Of note, overnight patient's serial troponin has been largely flat at 0.04.   Inpatient Medications  . albuterol  2.5 mg Nebulization Q6H  . amLODipine  5 mg Oral Daily  . aspirin  81 mg Oral Daily  . atorvastatin  40 mg Oral q1800  . furosemide  60 mg Intravenous TID  . heparin  5,000 Units Subcutaneous 3 times per day  . hydrALAZINE  12.5 mg Oral 3 times per day  . insulin aspart  0-15 Units Subcutaneous TID WC  . insulin aspart  0-5 Units Subcutaneous QHS  . insulin glargine  7 Units Subcutaneous  QHS  . isosorbide mononitrate  60 mg Oral Daily  . metoprolol succinate  25 mg Oral Daily  . nitroGLYCERIN  1 inch Topical Once  . pantoprazole  40 mg Oral Daily  . potassium chloride SA  20 mEq Oral Daily  . sodium chloride  3 mL Intravenous Q12H  . sodium chloride  3 mL Intravenous Q12H    Family History Family History  Problem Relation Age of Onset  . Diabetes insipidus Mother   . Hypertension Mother   . Hypertension Father   . Hypertension Sister   . Hypertension Child   . Stomach cancer Brother      Social History Social History   Social History  . Marital Status: Widowed    Spouse Name: N/A  . Number of Children: 7  . Years of Education: N/A   Occupational History  . Disabled    Social History Main Topics  . Smoking status: Never  Smoker   . Smokeless tobacco: Former Systems developer    Types: Snuff    Quit date: 10/13/2006     Comment: 02/26/12 "stopped snuff 4-6 years ago"  . Alcohol Use: No     Comment: "stopped drinking alcohol ~ 1980's"  . Drug Use: No  . Sexual Activity: No   Other Topics Concern  . Not on file   Social History Narrative   ** Merged History Encounter **       Lives with daughter     Review of Systems  General:  No chills, fever, night sweats or weight changes.  Cardiovascular:  No chest pain, palpitations. +dyspnea on exertion, edema, orthopnea, paroxysmal nocturnal dyspnea. Dermatological: No rash, lesions/masses Respiratory: +white foamy productive cough, dyspnea Urologic: No hematuria, dysuria Abdominal:   No nausea, vomiting, diarrhea, bright red blood per rectum, melena, or hematemesis Neurologic:  No visual changes, wkns, changes in mental status. All other systems reviewed and are otherwise negative except as noted above.  Physical Exam  Blood pressure 148/66, pulse 62, temperature 98.3 F (36.8 C), temperature source Oral, resp. rate 19, height 5\' 2"  (1.575 m), weight 154 lb 3.2 oz (69.945 kg), last menstrual period 12/19/1968, SpO2 62 %.  General: Pleasant, NAD Psych: Normal affect. Neuro: Alert and oriented X 3. Moves all extremities spontaneously. HEENT: Normal  Neck: Supple without bruits +JVD. Lungs:  Resp regular and unlabored. Bibasilar rale with markedly diminished breath sound.  Heart: RRR no s3, s4, or murmurs. Abdomen: Soft, non-tender, non-distended, BS + x 4.  Extremities: No clubbing, cyanosis. DP/PT/Radials 2+ and equal bilaterally. 2-3+ pitting edema  Labs   Recent Labs  11/04/15 2011 11/05/15 0411  TROPONINI 0.04* 0.04*   Lab Results  Component Value Date   WBC 4.0 11/05/2015   HGB 10.2* 11/05/2015   HCT 31.8* 11/05/2015   MCV 85.3 11/05/2015   PLT 216 11/05/2015    Recent Labs Lab 11/05/15 0411  NA 142  K 3.8  CL 107  CO2 25  BUN 26*    CREATININE 1.76*  CALCIUM 8.8*  GLUCOSE 94   Lab Results  Component Value Date   CHOL 114 11/05/2015   HDL 60 11/05/2015   LDLCALC 43 11/05/2015   TRIG 57 11/05/2015   No results found for: DDIMER  Radiology/Studies  Dg Chest 2 View  11/04/2015  CLINICAL DATA:  Left-sided chest pain. History of nonischemic cardiomyopathy and congestive heart failure. EXAM: CHEST - 2 VIEW COMPARISON:  04/04/2015 FINDINGS: The heart is diffusely enlarged. There is evidence of congestive heart  failure with diffuse interstitial edema present. There likely are small bilateral pleural effusions. No focal airspace disease or pneumothorax identified. The bony thorax is unremarkable. IMPRESSION: Significant cardiomegaly with evidence of CHF/interstitial edema and small bilateral pleural effusions. Electronically Signed   By: Aletta Edouard M.D.   On: 11/04/2015 14:39    ECG  NSR with LBBB  ASSESSMENT AND PLAN  1. Acute on chronic combined systolic and diastolic HF  - likely exacerbated by baseline severe MR/TR. Continue aggressive IV diuresis.   - Echo 11/05/2015 EF 30-35%, diffuse hypokinesis with akinesis of anterior and inferior septum, grade 2 DD, mild AR, severe MR, moderate pulm regurg, severe TR, PA peak peak pressure 54mmHg.  - although MR may be fixed with mitral clip, severe TR unlikely to be treated. Will focus on diuresis for now.   2. Chest pain relieved by productive cough  - trop flat 0.04x3, not ACS pattern  - likely related to HF, nonobstructive CAD on cath 2011, may consider myoview after diuresis if still symptomatic  3. HTN 4. HLD 5. DM 6. CKD stage III 7. history of stroke  Hilbert Corrigan, Hershal Coria 11/05/2015, 12:13 PM   Personally seen and examined. Agree with above.  82 year old female last seen in December 2016, well compensated and previously in May 2016 compensated with ejection fraction of 30-35% with severe mitral regurgitation.  In review of prior note from 02/19/15,  she was not felt to be a candidate for any operative percutaneous procedure given her age and comorbidities.  Orthopnea, mild rales at bases, blowing holosystolic murmur at apex, elderly-appearing, mildly increased work of breathing  I agree with Almyra Deforest. I wonder if she could be a potential mitral clip candidate? However, she has been well compensated in the past with diuresis.  Continue with diuresis.  High-risk features with decreased ejection fraction of 30% and severe mitral regurgitation.  Candee Furbish, MD

## 2015-11-05 NOTE — Progress Notes (Signed)
Pharmacist Heart Failure Core Measure Documentation  Assessment: [name] has an EF documented as 30-35% on 11/05/15 by ECHO.  Rationale: Heart failure patients with left ventricular systolic dysfunction (LVSD) and an EF < 40% should be prescribed an angiotensin converting enzyme inhibitor (ACEI) or angiotensin receptor blocker (ARB) at discharge unless a contraindication is documented in the medical record.  This patient is not currently on an ACEI or ARB for HF.  This note is being placed in the record in order to provide documentation that a contraindication to the use of these agents is present for this encounter.  ACE Inhibitor or Angiotensin Receptor Blocker is contraindicated (specify all that apply)  []   ACEI allergy AND ARB allergy []   Angioedema []   Moderate or severe aortic stenosis []   Hyperkalemia []   Hypotension []   Renal artery stenosis [x]   Worsening renal function, preexisting renal disease or dysfunction    Sacora Hawbaker D. Mina Marble, PharmD, BCPS Pager:  [phone] 11/05/2015, 2:29 PM

## 2015-11-05 NOTE — Evaluation (Signed)
Physical Therapy Evaluation Patient Details Name: [name] MRN: [id number] DOB: 1933-03-02 Today's Date: 11/05/2015   History of Present Illness  82 yo female who presents with Acute on chronic combined systolic diastolic heart failure  Clinical Impression  Patient demonstrates deficits in functional mobility as indicated below. Will benefit from continued skilled PT to address deficits and maximize function. Will see as indicated and progress as tolerated. Educated on ROM and LE elevation for edema control.  OF NOTE: VSS BP 153/53 HR 62-64 with activity and saturations >96% on room air.  Follow Up Recommendations Supervision/Assistance - 24 hour (assist provided by family)    Equipment Recommendations  None recommended by PT    Recommendations for Other Services       Precautions / Restrictions Precautions Precautions: Fall Restrictions Weight Bearing Restrictions: No      Mobility  Bed Mobility Overal bed mobility: Needs Assistance Bed Mobility: Supine to Sit     Supine to sit: Min guard;HOB elevated     General bed mobility comments: heavy reliance on rail and increased time to perform  Transfers Overall transfer level: Needs assistance Equipment used: 1 person hand held assist Transfers: Sit to/from Stand Sit to Stand: Min guard;Min assist         General transfer comment: min assist initially for stability (typically uses rw)  Ambulation/Gait Ambulation/Gait assistance: Min assist Ambulation Distance (Feet): 6 Feet Assistive device: 1 person hand held assist Gait Pattern/deviations: Step-to pattern;Decreased stride length;Shuffle;Trunk flexed Gait velocity: decreased Gait velocity interpretation: Below normal speed for age/gender General Gait Details: very slow with movement. cues for posture and positioning  Stairs            Wheelchair Mobility    Modified Rankin (Stroke Patients Only)       Balance Overall balance assessment:  History of Falls                                           Pertinent Vitals/Pain Pain Assessment: Faces Faces Pain Scale: Hurts little more Pain Location: headache Pain Descriptors / Indicators: Discomfort;Headache Pain Intervention(s): Monitored during session;Limited activity within patient's tolerance;Repositioned    Home Living Family/patient expects to be discharged to:: Private residence Living Arrangements: Children Available Help at Discharge: Family;Available 24 hours/day;Personal care attendant (aid in am) Type of Home: House Home Access: Stairs to enter Entrance Stairs-Rails: None Entrance Stairs-Number of Steps: 1 Home Layout: One level Home Equipment: [place] - 2 wheels;Shower seat;Wheelchair - manual      Prior Function Level of Independence: Needs assistance   Gait / Transfers Assistance Needed: supervision for ambulation with RW  ADL's / Homemaking Assistance Needed: needs assist with bathing  Comments: per son, patient usually transfers and ambulated short distance with RW and supervision (history of falls)     Hand Dominance   Dominant Hand: Right    Extremity/Trunk Assessment   Upper Extremity Assessment: Defer to OT evaluation           Lower Extremity Assessment: Generalized weakness (limited ROM and bilateral LE edema)      Cervical / Trunk Assessment: Kyphotic  Communication   Communication: HOH  Cognition Arousal/Alertness: Awake/alert Behavior During Therapy: Flat affect Overall Cognitive Status:  (slow processing)                      General Comments  Exercises        Assessment/Plan    PT Assessment Patient needs continued PT services  PT Diagnosis Difficulty walking;Abnormality of gait;Generalized weakness;Acute pain   PT Problem List Decreased range of motion;Decreased strength;Decreased activity tolerance;Decreased balance;Decreased mobility;Pain  PT Treatment Interventions DME  instruction;Gait training;Functional mobility training;Therapeutic activities;Therapeutic exercise;Balance training;Patient/family education   PT Goals (Current goals can be found in the Care Plan section) Acute Rehab PT Goals Patient Stated Goal: to feel better PT Goal Formulation: With patient Time For Goal Achievement: 11/19/15 Potential to Achieve Goals: Fair    Frequency Min 3X/week   Barriers to discharge        Co-evaluation               End of Session Equipment Utilized During Treatment: Gait belt Activity Tolerance: No increased pain Patient left: in chair;with call bell/phone within reach;with chair alarm set Nurse Communication: Mobility status    Functional Assessment Tool Used: clinical judgement Functional Limitation: Mobility: Walking and moving around Mobility: Walking and Moving Around Current Status [phone]): At least 20 percent but less than 40 percent impaired, limited or restricted Mobility: Walking and Moving Around Goal Status [phone]): At least 1 percent but less than 20 percent impaired, limited or restricted    Time: 0908-0931 PT Time Calculation (min) (ACUTE ONLY): 23 min   Charges:   PT Evaluation $PT Eval Moderate Complexity: 1 Procedure PT Treatments $Therapeutic Activity: 8-22 mins   PT G Codes:   PT G-Codes **NOT FOR INPATIENT CLASS** Functional Assessment Tool Used: clinical judgement Functional Limitation: Mobility: Walking and moving around Mobility: Walking and Moving Around Current Status [phone]): At least 20 percent but less than 40 percent impaired, limited or restricted Mobility: Walking and Moving Around Goal Status [phone]): At least 1 percent but less than 20 percent impaired, limited or restricted    Duncan Dull 11/05/2015, 9:39 AM Alben Deeds, PT DPT  [phone]

## 2015-11-05 NOTE — Progress Notes (Signed)
[facility] PROGRESS NOTE  [name] [id number] DOB: 03-10-1993 DOA: 11/04/2015 PCP: [name], MD  Length of stay:    Assessment/Plan: Principal Problem:   Acute on chronic combined systolic and diastolic heart failure ([place]) Active Problems:   DM (diabetes mellitus), type 2, uncontrolled (HCC)   Iron deficiency anemia secondary to blood loss (chronic)   Essential hypertension   GERD   CAD (coronary artery disease)   Memory difficulties   Atrial fibrillation (HCC)   Dyspnea   Chest pain   Diabetes mellitus due to underlying condition with hyperosmolarity without coma, without long-term current use of insulin (HCC)   Paroxysmal atrial fibrillation (HCC)   CKD (chronic kidney disease)   Brief summary 82 year old thin appearing widowed [place] American female   She has been seen by Dr. Aldona Bar in the past. She's a history of nonischemic myopathy documented by cardiac catheterization performed at Dr. Debara Pickett 05/24/10. She had minimal CAD with an ejection fraction of 20%. Her other problems include treated hypertension, hyperlipidemia and diabetes. She has had a stroke in the past and walks with the aid of a walker.  She has been hospitalized once in 2016 for congestive heart failure. She has previously been hospitalized with a stroke and a non-STEMI. Consideration was given to putting her on oral anticoagulant event monitor was obtained which did not show A. Fib with rapid PVCs and a short burst of nonsustained ventricular tachycardia. She was primarily a bed to chair existence in her home and infrequently gets out of the house.. Presented by EMS 11/04/15 with chest pain and shortness of breath. Admitted for congestive heart failure, chest x-ray shows cardiomegaly with interstitial edema and bilateral pleural effusions  Assessment and plan #1. Acute on chronic combined systolic diastolic heart failure. Unclear. Reports compliance with medications and diet. Most recent  echo in 2015 reveals an EF of [id number] grade 2 diastolic dysfunction issues hypokinesis. Chest x-ray evidence of CHF/interstitial edema swells bilateral pleural effusions. BNP 1841, initial troponin 0.04. Continue telemetry -IV Lasix 60 mg TID -Hold home Demadex -Monitor intake and output since admission -317ml -Daily weights admission weight standing= 66.9 kg -Continue home beta blocker Repeat echo, cardiology to consult for chf   #2. Chest pain at rest. Heart score 5. Typical and atypical features. Initial troponin 0.04. KG without acute changes. Troponin 0.042 -Obtain serial EKGs -Echocardiogram -Lipid panel -ldl 43 -Continue home meds  #3. Diabetes. Appears controlled. -Hemoglobin A1c -Sliding scale insulin -Home Lantus at lower dose  #4. A. Fib/history of stroke. Chadvasc score 4. Not a candidate for anticoagulation. Ambulates with a walker -Currently in NSR  #5. Hypertension. Controlled. -continuie home amlodipine, imdur -Monitor  6. Iron deficiency anemia. Chronic. Hemoglobin on admission 11.2. -Stable at baseline -Monitor  CKD (baseline Cr ~2.15) -Currently patient's creatinine better than baseline  DVT prophylaxsis heparin  Code Status:      Code Status Orders        Start     Ordered   11/04/15 1532  Do not attempt resuscitation (DNR)   Continuous    Question Answer Comment  In the event of cardiac or respiratory ARREST Do not call a "code blue"   In the event of cardiac or respiratory ARREST Do not perform Intubation, CPR, defibrillation or ACLS   In the event of cardiac or respiratory ARREST Use medication by any route, position, wound care, and other measures to relive pain and suffering. May use oxygen, suction and manual  treatment of airway obstruction as needed for comfort.      11/04/15 1536    Code Status History    Date Active Date Inactive Code Status Order ID Comments User Context     Family Communication: Discussed in detail with the  patient, all imaging results, lab results explained to the patient   Disposition Plan:  As above       Consultants:  Cardiology  Procedures:  2-D echo  Antibiotics: Anti-infectives    None         HPI/Subjective: Sitting comfortably in the chair , improved sob   Objective: Filed Vitals:   11/04/15 2211 11/05/15 0133 11/05/15 0317 11/05/15 0701  BP:  148/66  143/69  Pulse:  58  62  Temp:  97.9 F (36.6 C)  98.3 F (36.8 C)  TempSrc:  Oral  Oral  Resp:  20  19  Height:      Weight:    69.945 kg (154 lb 3.2 oz)  SpO2: 98% 100% 99% 100%    Intake/Output Summary (Last 24 hours) at 11/05/15 0856 Last data filed at 11/05/15 0748  Gross per 24 hour  Intake    120 ml  Output   1475 ml  Net  -1355 ml    Exam: Cardiovascular: Normal rate and regular rhythm.  No murmur heard. Pulmonary/Chest: Effort normal and breath sounds normal.  Abdominal: Soft. Bowel sounds are normal. She exhibits no distension. There is no tenderness.  Musculoskeletal: Normal range of motion. She exhibits edema. She exhibits no tenderness.  3+ pretibial pitting edema bilaterally  Neurological: She is alert. Coordination normal.    Data Review   Micro Results No results found for this or any previous visit (from the past 240 hour(s)).  Radiology Reports Dg Chest 2 View  11/04/2015  CLINICAL DATA:  Left-sided chest pain. History of nonischemic cardiomyopathy and congestive heart failure. EXAM: CHEST - 2 VIEW COMPARISON:  04/04/2015 FINDINGS: The heart is diffusely enlarged. There is evidence of congestive heart failure with diffuse interstitial edema present. There likely are small bilateral pleural effusions. No focal airspace disease or pneumothorax identified. The bony thorax is unremarkable. IMPRESSION: Significant cardiomegaly with evidence of CHF/interstitial edema and small bilateral pleural effusions. Electronically Signed   By: Aletta Edouard M.D.   On: 11/04/2015 14:39      CBC  Recent Labs Lab 11/04/15 1350 11/05/15 0411  WBC 4.1 4.0  HGB 11.2* 10.2*  HCT 34.9* 31.8*  PLT 235 216  MCV 85.1 85.3  MCH 27.3 27.3  MCHC 32.1 32.1  RDW 19.8* 19.5*  LYMPHSABS 1.0  --   MONOABS 0.4  --   EOSABS 0.0  --   BASOSABS 0.0  --     Chemistries   Recent Labs Lab 11/04/15 1350 11/05/15 0411  NA 143 142  K 4.9 3.8  CL 109 107  CO2 25 25  GLUCOSE 125* 94  BUN 27* 26*  CREATININE 1.74* 1.76*  CALCIUM 9.1 8.8*   ------------------------------------------------------------------------------------------------------------------ estimated creatinine clearance is 23.3 mL/min (by C-G formula based on Cr of 1.76). ------------------------------------------------------------------------------------------------------------------ No results for input(s): HGBA1C in the last 72 hours. ------------------------------------------------------------------------------------------------------------------ No results for input(s): CHOL, HDL, LDLCALC, TRIG, CHOLHDL, LDLDIRECT in the last 72 hours. ------------------------------------------------------------------------------------------------------------------ No results for input(s): TSH, T4TOTAL, T3FREE, THYROIDAB in the last 72 hours.  Invalid input(s): FREET3 ------------------------------------------------------------------------------------------------------------------ No results for input(s): VITAMINB12, FOLATE, FERRITIN, TIBC, IRON, RETICCTPCT in the last 72 hours.  Coagulation profile No results for input(s): INR, PROTIME in  the last 168 hours.  No results for input(s): DDIMER in the last 72 hours.  Cardiac Enzymes  Recent Labs Lab 11/04/15 2011 11/05/15 0411  TROPONINI 0.04* 0.04*   ------------------------------------------------------------------------------------------------------------------ Invalid input(s): POCBNP   CBG:  Recent Labs Lab 11/04/15 1721 11/04/15 2121 11/05/15 0808  GLUCAP  170* 175* 86       Studies: Dg Chest 2 View  11/04/2015  CLINICAL DATA:  Left-sided chest pain. History of nonischemic cardiomyopathy and congestive heart failure. EXAM: CHEST - 2 VIEW COMPARISON:  04/04/2015 FINDINGS: The heart is diffusely enlarged. There is evidence of congestive heart failure with diffuse interstitial edema present. There likely are small bilateral pleural effusions. No focal airspace disease or pneumothorax identified. The bony thorax is unremarkable. IMPRESSION: Significant cardiomegaly with evidence of CHF/interstitial edema and small bilateral pleural effusions. Electronically Signed   By: Aletta Edouard M.D.   On: 11/04/2015 14:39      Lab Results  Component Value Date   HGBA1C 9.3* 04/04/2015   HGBA1C 7.0* 05/11/2014   HGBA1C 11.5* 02/04/2014   Lab Results  Component Value Date   MICROALBUR 6.05* 07/12/2009   LDLCALC 58 02/09/2014   CREATININE 1.76* 11/05/2015       Scheduled Meds: . albuterol  2.5 mg Nebulization Q6H  . amLODipine  5 mg Oral Daily  . aspirin  81 mg Oral Daily  . atorvastatin  40 mg Oral q1800  . furosemide  60 mg Intravenous TID  . heparin  5,000 Units Subcutaneous 3 times per day  . hydrALAZINE  12.5 mg Oral 3 times per day  . insulin aspart  0-15 Units Subcutaneous TID WC  . insulin aspart  0-5 Units Subcutaneous QHS  . insulin glargine  7 Units Subcutaneous QHS  . isosorbide mononitrate  60 mg Oral Daily  . metoprolol succinate  25 mg Oral Daily  . nitroGLYCERIN  1 inch Topical Once  . pantoprazole  40 mg Oral Daily  . potassium chloride SA  20 mEq Oral Daily  . sodium chloride  3 mL Intravenous Q12H  . sodium chloride  3 mL Intravenous Q12H   Continuous Infusions:   Principal Problem:   Acute on chronic combined systolic and diastolic heart failure (HCC) Active Problems:   DM (diabetes mellitus), type 2, uncontrolled (HCC)   Iron deficiency anemia secondary to blood loss (chronic)   Essential hypertension    GERD   CAD (coronary artery disease)   Memory difficulties   Atrial fibrillation (HCC)   Dyspnea   Chest pain   Diabetes mellitus due to underlying condition with hyperosmolarity without coma, without long-term current use of insulin (HCC)   Paroxysmal atrial fibrillation (HCC)   CKD (chronic kidney disease)    Time spent: 45 minutes   [facility] Pager [phone]. If 7PM-7AM, please contact night-coverage at [email or web address], password [facility] 11/05/2015, 8:56 AM

## 2015-11-05 NOTE — Progress Notes (Signed)
  Echocardiogram 2D Echocardiogram has been performed.  Jennette Dubin 11/05/2015, 9:13 AM

## 2015-11-06 DIAGNOSIS — I481 Persistent atrial fibrillation: Secondary | ICD-10-CM

## 2015-11-06 DIAGNOSIS — I4891 Unspecified atrial fibrillation: Secondary | ICD-10-CM

## 2015-11-06 DIAGNOSIS — I340 Nonrheumatic mitral (valve) insufficiency: Secondary | ICD-10-CM | POA: Insufficient documentation

## 2015-11-06 DIAGNOSIS — N183 Chronic kidney disease, stage 3 (moderate): Secondary | ICD-10-CM

## 2015-11-06 LAB — GLUCOSE, CAPILLARY
GLUCOSE-CAPILLARY: 179 mg/dL — AB (ref 65–99)
GLUCOSE-CAPILLARY: 165 mg/dL — AB (ref 65–99)
Glucose-Capillary: 199 mg/dL — ABNORMAL HIGH (ref 65–99)
Glucose-Capillary: 142 mg/dL — ABNORMAL HIGH (ref 65–99)

## 2015-11-06 LAB — HEMOGLOBIN A1C
Hgb A1c MFr Bld: 9.8 % — ABNORMAL HIGH (ref 4.8–5.6)
MEAN PLASMA GLUCOSE: 235 mg/dL

## 2015-11-06 LAB — COMPREHENSIVE METABOLIC PANEL
ALBUMIN: 3.2 g/dL — AB (ref 3.5–5.0)
ALK PHOS: 218 U/L — AB (ref 38–126)
ALT: 21 U/L (ref 14–54)
ANION GAP: 11 (ref 5–15)
AST: 19 U/L (ref 15–41)
BILIRUBIN TOTAL: 1.1 mg/dL (ref 0.3–1.2)
BUN: 30 mg/dL — ABNORMAL HIGH (ref 6–20)
CALCIUM: 9.2 mg/dL (ref 8.9–10.3)
CO2: 24 mmol/L (ref 22–32)
CREATININE: 1.77 mg/dL — AB (ref 0.44–1.00)
Chloride: 105 mmol/L (ref 101–111)
GFR, EST AFRICAN AMERICAN: 30 mL/min — AB (ref >60)
GFR, EST NON AFRICAN AMERICAN: 26 mL/min — AB (ref >60)
Glucose, Bld: 226 mg/dL — ABNORMAL HIGH (ref 65–99)
Potassium: 4 mmol/L (ref 3.5–5.1)
Sodium: 140 mmol/L (ref 135–145)
TOTAL PROTEIN: 6.6 g/dL (ref 6.5–8.1)

## 2015-11-06 LAB — CBC
HEMATOCRIT: 34.7 % — AB (ref 36.0–46.0)
HEMOGLOBIN: 11.2 g/dL — AB (ref 12.0–15.0)
MCH: 27.5 pg (ref 26.0–34.0)
MCHC: 32.3 g/dL (ref 30.0–36.0)
MCV: 85.3 fL (ref 78.0–100.0)
Platelets: 202 10*3/uL (ref 150–400)
RBC: 4.07 MIL/uL (ref 3.87–5.11)
RDW: 19.5 % — AB (ref 11.5–15.5)
WBC: 3.5 10*3/uL — ABNORMAL LOW (ref 4.0–10.5)

## 2015-11-06 NOTE — Progress Notes (Signed)
Cardiologist: Dr. Gwenlyn Found  Subjective:  Laying flat, comfortable. Improved breathing. She was sleeping when I walked in, arousable.  Objective:  Vital Signs in the last 24 hours: Temp:  [97.9 F (36.6 C)-98.4 F (36.9 C)] 97.9 F (36.6 C) (01/24 1146) Pulse Rate:  [64-69] 69 (01/24 1146) Resp:  [16-20] 18 (01/24 1146) BP: (135-154)/(63-82) 135/82 mmHg (01/24 1146) SpO2:  [93 %-100 %] 98 % (01/24 1146) Weight:  [146 lb 3.2 oz (66.316 kg)] 146 lb 3.2 oz (66.316 kg) (01/24 0658)  Intake/Output from previous day: 01/23 0701 - 01/24 0700 In: 920 [P.O.:920] Out: 1602 [Urine:1600; Stool:2]   Physical Exam: General: Elderly, in bed, laying on her side, no respiratory distress in no acute distress. Head:  Normocephalic and atraumatic. Lungs: Clear to auscultation and percussion. Heart: Normal S1 and S2.  1/6 systolic apical murmur, no rubs or gallops.  Abdomen: soft, non-tender, positive bowel sounds. Extremities: No clubbing or cyanosis. 2-3+ bilateral edema. Neurologic: Alert and oriented x 3.    Lab Results:  Recent Labs  11/05/15 0411 11/06/15 0718  WBC 4.0 3.5*  HGB 10.2* 11.2*  PLT 216 202    Recent Labs  11/05/15 0411 11/06/15 0718  NA 142 140  K 3.8 4.0  CL 107 105  CO2 25 24  GLUCOSE 94 226*  BUN 26* 30*  CREATININE 1.76* 1.77*    Recent Labs  11/04/15 2011 11/05/15 0411  TROPONINI 0.04* 0.04*   Hepatic Function Panel  Recent Labs  11/06/15 0718  PROT 6.6  ALBUMIN 3.2*  AST 19  ALT 21  ALKPHOS 218*  BILITOT 1.1    Recent Labs  11/05/15 0411  CHOL 114   No results for input(s): PROTIME in the last 72 hours.  Imaging: Dg Chest 2 View  11/04/2015  CLINICAL DATA:  Left-sided chest pain. History of nonischemic cardiomyopathy and congestive heart failure. EXAM: CHEST - 2 VIEW COMPARISON:  04/04/2015 FINDINGS: The heart is diffusely enlarged. There is evidence of congestive heart failure with diffuse interstitial edema present. There  likely are small bilateral pleural effusions. No focal airspace disease or pneumothorax identified. The bony thorax is unremarkable. IMPRESSION: Significant cardiomegaly with evidence of CHF/interstitial edema and small bilateral pleural effusions. Electronically Signed   By: Aletta Edouard M.D.   On: 11/04/2015 14:39   Personally viewed.   Telemetry: No adverse arrhythmias. Personally viewed.   EKG:   No new today.  Cardiac Studies:  Echocardiogram: 11/05/15-EF 30-35% with severe mitral regurgitation  Meds: Scheduled Meds: . albuterol  2.5 mg Nebulization BID  . amLODipine  5 mg Oral Daily  . aspirin  81 mg Oral Daily  . atorvastatin  40 mg Oral q1800  . furosemide  60 mg Intravenous TID  . heparin  5,000 Units Subcutaneous 3 times per day  . hydrALAZINE  12.5 mg Oral 3 times per day  . insulin aspart  0-15 Units Subcutaneous TID WC  . insulin aspart  0-5 Units Subcutaneous QHS  . insulin glargine  7 Units Subcutaneous QHS  . isosorbide mononitrate  60 mg Oral Daily  . metoprolol succinate  25 mg Oral Daily  . nitroGLYCERIN  1 inch Topical Once  . pantoprazole  40 mg Oral Daily  . potassium chloride SA  20 mEq Oral Daily  . sodium chloride  3 mL Intravenous Q12H  . sodium chloride  3 mL Intravenous Q12H   Continuous Infusions:  PRN Meds:.sodium chloride, acetaminophen **OR** acetaminophen, albuterol, HYDROcodone-acetaminophen, ondansetron **OR** ondansetron (ZOFRAN) IV,  polyethylene glycol, sodium chloride  Assessment/Plan:  Principal Problem:   Acute on chronic combined systolic and diastolic heart failure (HCC) Active Problems:   DM (diabetes mellitus), type 2, uncontrolled (HCC)   Iron deficiency anemia secondary to blood loss (chronic)   Essential hypertension   GERD   CAD (coronary artery disease)   Memory difficulties   Atrial fibrillation (HCC)   Dyspnea   Acute on chronic heart failure (HCC)   Chest pain   Diabetes mellitus due to underlying condition with  hyperosmolarity without coma, without long-term current use of insulin (HCC)   Paroxysmal atrial fibrillation (HCC)   CKD (chronic kidney disease)   82 year old female with moderate to severe mitral regurgitation with acute on chronic combined systolic and diastolic heart failure, chest pain, essential hypertension, hyperlipidemia, diabetes, history of stroke, chronic kidney disease stage III, deconditioning.  Severe mitral regurgitation  - Previously not felt to be a surgical candidate and I agree.  - Previously well compensated when maintaining euvolemic  - She is feeling better with diuresis.  - Continue with medical management.  Acute on chronic systolic heart failure  - Continue 1 more day at least of IV Lasix.  - Output -2.2 L total  - Plan on transitioning to oral Demadex tomorrow if clinically compensated  - Low-dose metoprolol and hydralazine.  - Improving  Deconditioning  - Spent most of her time in bed to chair at home.  SKAINS, Bagnell 11/06/2015, 2:00 PM

## 2015-11-06 NOTE — Progress Notes (Addendum)
[facility] PROGRESS NOTE  [name] [id number] DOB: 12/13/1932 DOA: 11/04/2015 PCP: [name], MD  Length of stay: 1   Assessment/Plan: Principal Problem:   Acute on chronic combined systolic and diastolic heart failure ([place]) Active Problems:   DM (diabetes mellitus), type 2, uncontrolled (HCC)   Iron deficiency anemia secondary to blood loss (chronic)   Essential hypertension   GERD   CAD (coronary artery disease)   Memory difficulties   Atrial fibrillation (HCC)   Dyspnea   Acute on chronic heart failure (HCC)   Chest pain   Diabetes mellitus due to underlying condition with hyperosmolarity without coma, without long-term current use of insulin (HCC)   Paroxysmal atrial fibrillation (HCC)   CKD (chronic kidney disease)   Brief summary 83 year old thin appearing widowed [place] American female   She has been seen by Dr. Aldona Bar in the past. She's a history of nonischemic myopathy documented by cardiac catheterization performed at Dr. Debara Pickett 05/24/10. She had minimal CAD with an ejection fraction of 20%. Her other problems include treated hypertension, hyperlipidemia and diabetes. She has had a stroke in the past and walks with the aid of a walker.  She has been hospitalized once in 2016 for congestive heart failure. She has previously been hospitalized with a stroke and a non-STEMI. Consideration was given to putting her on oral anticoagulant event monitor was obtained which did not show A. Fib with rapid PVCs and a short burst of nonsustained ventricular tachycardia. She was primarily a bed to chair existence in her home and infrequently gets out of the house.. Presented by EMS 11/04/15 with chest pain and shortness of breath. Admitted for congestive heart failure, chest x-ray shows cardiomegaly with interstitial edema and bilateral pleural effusions  Assessment and plan #1. Acute on chronic combined systolic diastolic heart failure. Unclear. Reports  compliance with medications and diet. Repeat 2-D echo this admission reveals EF of 99991111 grade 2 diastolic dysfunction with severe mitral regurgitation.  Chest x-ray evidence of CHF/interstitial edema swells bilateral pleural effusions. BNP 1841, initial troponin 0.04. Continue telemetry -IV Lasix 60 mg TID,Plan on transitioning to oral Demadex tomorrow if clinically compensated -Hold home Demadex -Monitor intake and output since admission -321ml -Daily weights 147>146 -Continue home beta blocker  Awaiting further cardiology recommendations  #2. Chest pain at rest. Heart score 5. Typical and atypical features. Initial troponin 0.04. KG without acute changes. Troponin 0.042 -Lipid panel -LDL 43 -Continue home meds  #3. Diabetes. Stable -Hemoglobin A1c 9.8 -Sliding scale insulin -Home Lantus at lower dose  #4. A. Fib/history of stroke. Chadvasc score 4. Not a candidate for anticoagulation. Ambulates with a walker -Currently in NSR  #5. Hypertension. Controlled. -continuie home amlodipine, imdur -Monitor  6. Iron deficiency anemia. Chronic. Hemoglobin on admission 11.2. -Stable at baseline -Monitor  7. CKD (baseline Cr ~2.15) -Currently patient's creatinine 1.77, in the setting of diuresis  DVT prophylaxsis heparin  Code Status:      Code Status Orders     DO NOT RESUSCITATE    Start     Ordered     Family Communication: Discussed in detail with the patient, all imaging results, lab results explained to the patient   Disposition Plan:  Per cardiology recommendations, PT/OT evaluation      Consultants:  Cardiology  Procedures:  2-D echo  Antibiotics: Anti-infectives    None         HPI/Subjective: Currently 98% on room air, improved shortness of breath  Objective: Filed Vitals:   11/05/15 2134 11/06/15 0440 11/06/15 0658 11/06/15 0857  BP: 141/63 144/71    Pulse: 69 64  68  Temp: 98.4 F (36.9 C) 97.9 F (36.6 C)    TempSrc: Oral Oral     Resp: 20 18  16   Height:      Weight:   66.316 kg (146 lb 3.2 oz)   SpO2: 100% 100%  98%    Intake/Output Summary (Last 24 hours) at 11/06/15 0909 Last data filed at 11/06/15 0811  Gross per 24 hour  Intake    920 ml  Output   1702 ml  Net   -782 ml    Exam: Cardiovascular: Normal rate and regular rhythm.  No murmur heard. Pulmonary/Chest: Effort normal and breath sounds normal.  Abdominal: Soft. Bowel sounds are normal. She exhibits no distension. There is no tenderness.  Musculoskeletal: Normal range of motion. She exhibits edema. She exhibits no tenderness.  3+ pretibial pitting edema bilaterally  Neurological: She is alert. Coordination normal.    Data Review   Micro Results No results found for this or any previous visit (from the past 240 hour(s)).  Radiology Reports Dg Chest 2 View  11/04/2015  CLINICAL DATA:  Left-sided chest pain. History of nonischemic cardiomyopathy and congestive heart failure. EXAM: CHEST - 2 VIEW COMPARISON:  04/04/2015 FINDINGS: The heart is diffusely enlarged. There is evidence of congestive heart failure with diffuse interstitial edema present. There likely are small bilateral pleural effusions. No focal airspace disease or pneumothorax identified. The bony thorax is unremarkable. IMPRESSION: Significant cardiomegaly with evidence of CHF/interstitial edema and small bilateral pleural effusions. Electronically Signed   By: Aletta Edouard M.D.   On: 11/04/2015 14:39     CBC  Recent Labs Lab 11/04/15 1350 11/05/15 0411 11/06/15 0718  WBC 4.1 4.0 3.5*  HGB 11.2* 10.2* 11.2*  HCT 34.9* 31.8* 34.7*  PLT 235 216 202  MCV 85.1 85.3 85.3  MCH 27.3 27.3 27.5  MCHC 32.1 32.1 32.3  RDW 19.8* 19.5* 19.5*  LYMPHSABS 1.0  --   --   MONOABS 0.4  --   --   EOSABS 0.0  --   --   BASOSABS 0.0  --   --     Chemistries   Recent Labs Lab 11/04/15 1350 11/05/15 0411  NA 143 142  K 4.9 3.8  CL 109 107  CO2 25 25  GLUCOSE 125* 94  BUN  27* 26*  CREATININE 1.74* 1.76*  CALCIUM 9.1 8.8*   ------------------------------------------------------------------------------------------------------------------ estimated creatinine clearance is 22.8 mL/min (by C-G formula based on Cr of 1.76). ------------------------------------------------------------------------------------------------------------------  Recent Labs  11/05/15 0411  HGBA1C 9.8*   ------------------------------------------------------------------------------------------------------------------  Recent Labs  11/05/15 0411  CHOL 114  HDL 60  LDLCALC 43  TRIG 57  CHOLHDL 1.9   ------------------------------------------------------------------------------------------------------------------ No results for input(s): TSH, T4TOTAL, T3FREE, THYROIDAB in the last 72 hours.  Invalid input(s): FREET3 ------------------------------------------------------------------------------------------------------------------ No results for input(s): VITAMINB12, FOLATE, FERRITIN, TIBC, IRON, RETICCTPCT in the last 72 hours.  Coagulation profile No results for input(s): INR, PROTIME in the last 168 hours.  No results for input(s): DDIMER in the last 72 hours.  Cardiac Enzymes  Recent Labs Lab 11/04/15 2011 11/05/15 0411  TROPONINI 0.04* 0.04*   ------------------------------------------------------------------------------------------------------------------ Invalid input(s): POCBNP   CBG:  Recent Labs Lab 11/05/15 0808 11/05/15 1121 11/05/15 1619 11/05/15 2208 11/06/15 0821  GLUCAP 86 137* 211* 158* 199*       Studies: Dg Chest 2 View  11/04/2015  CLINICAL DATA:  Left-sided chest pain. History of nonischemic cardiomyopathy and congestive heart failure. EXAM: CHEST - 2 VIEW COMPARISON:  04/04/2015 FINDINGS: The heart is diffusely enlarged. There is evidence of congestive heart failure with diffuse interstitial edema present. There likely are small  bilateral pleural effusions. No focal airspace disease or pneumothorax identified. The bony thorax is unremarkable. IMPRESSION: Significant cardiomegaly with evidence of CHF/interstitial edema and small bilateral pleural effusions. Electronically Signed   By: Aletta Edouard M.D.   On: 11/04/2015 14:39      Lab Results  Component Value Date   HGBA1C 9.8* 11/05/2015   HGBA1C 9.3* 04/04/2015   HGBA1C 7.0* 05/11/2014   Lab Results  Component Value Date   MICROALBUR 6.05* 07/12/2009   LDLCALC 43 11/05/2015   CREATININE 1.76* 11/05/2015       Scheduled Meds: . albuterol  2.5 mg Nebulization BID  . amLODipine  5 mg Oral Daily  . aspirin  81 mg Oral Daily  . atorvastatin  40 mg Oral q1800  . furosemide  60 mg Intravenous TID  . heparin  5,000 Units Subcutaneous 3 times per day  . hydrALAZINE  12.5 mg Oral 3 times per day  . insulin aspart  0-15 Units Subcutaneous TID WC  . insulin aspart  0-5 Units Subcutaneous QHS  . insulin glargine  7 Units Subcutaneous QHS  . isosorbide mononitrate  60 mg Oral Daily  . metoprolol succinate  25 mg Oral Daily  . nitroGLYCERIN  1 inch Topical Once  . pantoprazole  40 mg Oral Daily  . potassium chloride SA  20 mEq Oral Daily  . sodium chloride  3 mL Intravenous Q12H  . sodium chloride  3 mL Intravenous Q12H   Continuous Infusions:   Principal Problem:   Acute on chronic combined systolic and diastolic heart failure (HCC) Active Problems:   DM (diabetes mellitus), type 2, uncontrolled (HCC)   Iron deficiency anemia secondary to blood loss (chronic)   Essential hypertension   GERD   CAD (coronary artery disease)   Memory difficulties   Atrial fibrillation (HCC)   Dyspnea   Acute on chronic heart failure (HCC)   Chest pain   Diabetes mellitus due to underlying condition with hyperosmolarity without coma, without long-term current use of insulin (HCC)   Paroxysmal atrial fibrillation (HCC)   CKD (chronic kidney disease)    Time spent:  45 minutes   [facility] Pager [phone]. If 7PM-7AM, please contact night-coverage at [email or web address], password [facility] 11/06/2015, 9:09 AM  LOS: 1 day

## 2015-11-06 NOTE — Progress Notes (Signed)
Physical Therapy Treatment Patient Details Name: [name] MRN: [id number] DOB: 1933/04/24 Today's Date: 11/06/2015    History of Present Illness 82 yo female who presents with Acute on chronic combined systolic diastolic heart failure    PT Comments    Patient seen for progression of mobility and therapy. Patient able to tolerate in room ambulation with use of RW but fatigues easily in BLEs. Patient also demonstrates physical "bouncing/shaking" type of movements while ambulating. Educated patient on there ex and LE elevation for edema control.    Follow Up Recommendations  Supervision/Assistance - 24 hour (assist provided by family)     Equipment Recommendations  None recommended by PT    Recommendations for Other Services       Precautions / Restrictions Precautions Precautions: Fall Restrictions Weight Bearing Restrictions: No    Mobility  Bed Mobility   Received in chair        Transfers Overall transfer level: Needs assistance Equipment used: Rolling walker (2 wheeled) Transfers: Sit to/from Stand Sit to Stand: Min guard         General transfer comment: performed x3 from chair and commode. Patient cued for positioning and hand placement  Ambulation/Gait Ambulation/Gait assistance: Min guard Ambulation Distance (Feet): 30 Feet Assistive device: Rolling walker (2 wheeled) Gait Pattern/deviations: Step-to pattern;Decreased stride length;Shuffle;Trunk flexed Gait velocity: decreased Gait velocity interpretation: Below normal speed for age/gender General Gait Details: very slow ambulation. Patient begins to 'bounce' up and down during ambulation. heavy reliance on RW for UE support and to maintain balance   Stairs            Wheelchair Mobility    Modified Rankin (Stroke Patients Only)       Balance Overall balance assessment: History of Falls                                  Cognition Arousal/Alertness:  Awake/alert Behavior During Therapy: Flat affect Overall Cognitive Status:  (slow processing)                      Exercises      General Comments        Pertinent Vitals/Pain Pain Assessment: Faces Faces Pain Scale: Hurts a little bit Pain Location: stomach Pain Descriptors / Indicators: Discomfort Pain Intervention(s): Monitored during session    Home Living                      Prior Function            PT Goals (current goals can now be found in the care plan section) Acute Rehab PT Goals Patient Stated Goal: to feel better PT Goal Formulation: With patient Time For Goal Achievement: 11/19/15 Potential to Achieve Goals: Fair Progress towards PT goals: Progressing toward goals    Frequency  Min 3X/week    PT Plan Current plan remains appropriate    Co-evaluation             End of Session Equipment Utilized During Treatment: Gait belt Activity Tolerance: No increased pain Patient left: in chair;with call bell/phone within reach;with chair alarm set     Time: [id number] PT Time Calculation (min) (ACUTE ONLY): 18 min  Charges:  $Therapeutic Activity: 8-22 mins                    G Codes:      Jesly Hartmann,  Judi Saa 11/06/2015, 9:36 AM Alben Deeds, PT DPT  [phone]

## 2015-11-06 NOTE — Consult Note (Signed)
   [facility] Inpatient Consult   11/06/2015  [name] 1933/04/19 [id number] Patient evaluated for [facility] services as a benefit of her [facility]. Came by to see patient she was sound asleep and no family was in the room at this time.  Will follow of for any care management needs. Chart encounter reviewed for Physical Therapy recommendations.  For questions, please contact: Natividad Brood, RN BSN [facility] Liaison  [phone] business mobile phone Toll free office [phone]

## 2015-11-07 DIAGNOSIS — N184 Chronic kidney disease, stage 4 (severe): Secondary | ICD-10-CM

## 2015-11-07 DIAGNOSIS — I071 Rheumatic tricuspid insufficiency: Secondary | ICD-10-CM

## 2015-11-07 DIAGNOSIS — I371 Nonrheumatic pulmonary valve insufficiency: Secondary | ICD-10-CM

## 2015-11-07 LAB — COMPREHENSIVE METABOLIC PANEL
ALBUMIN: 2.9 g/dL — AB (ref 3.5–5.0)
ALT: 20 U/L (ref 14–54)
ANION GAP: 11 (ref 5–15)
AST: 18 U/L (ref 15–41)
Alkaline Phosphatase: 189 U/L — ABNORMAL HIGH (ref 38–126)
BUN: 33 mg/dL — AB (ref 6–20)
CHLORIDE: 103 mmol/L (ref 101–111)
CO2: 25 mmol/L (ref 22–32)
Calcium: 9.2 mg/dL (ref 8.9–10.3)
Creatinine, Ser: 1.87 mg/dL — ABNORMAL HIGH (ref 0.44–1.00)
GFR calc Af Amer: 28 mL/min — ABNORMAL LOW (ref >60)
GFR, EST NON AFRICAN AMERICAN: 24 mL/min — AB (ref >60)
GLUCOSE: 129 mg/dL — AB (ref 65–99)
POTASSIUM: 3.7 mmol/L (ref 3.5–5.1)
Sodium: 139 mmol/L (ref 135–145)
Total Bilirubin: 0.8 mg/dL (ref 0.3–1.2)
Total Protein: 6.2 g/dL — ABNORMAL LOW (ref 6.5–8.1)

## 2015-11-07 LAB — GLUCOSE, CAPILLARY
GLUCOSE-CAPILLARY: 108 mg/dL — AB (ref 65–99)
Glucose-Capillary: 133 mg/dL — ABNORMAL HIGH (ref 65–99)

## 2015-11-07 MED ORDER — INSULIN GLARGINE 100 UNIT/ML ~~LOC~~ SOLN: 7.0000 [IU] | Freq: Every day | SUBCUTANEOUS | Status: AC

## 2015-11-07 MED ORDER — TORSEMIDE 20 MG PO TABS
20.0000 mg | ORAL_TABLET | Freq: Every day | ORAL | Status: DC
  Administered 2015-11-07: 20 mg via ORAL
  Filled 2015-11-07: qty 1

## 2015-11-07 MED ORDER — INSULIN ASPART 100 UNIT/ML ~~LOC~~ SOLN: 0.0000 [IU] | Freq: Three times a day (TID) | SUBCUTANEOUS | Status: DC

## 2015-11-07 MED ORDER — TORSEMIDE 20 MG PO TABS: ORAL_TABLET | ORAL | Status: DC

## 2015-11-07 NOTE — Care Management Note (Signed)
Case Management Note  Patient Details  Name: [name] MRN: [id number] Date of Birth: 09-25-1933  Subjective/Objective:      Admitted with CHF              Action/Plan: Patient lives at home with son [name], who is at the bedside at present. No DME needed, she has a rolling walker and 3:1; Patient could benefit from a Disease Management Program for CHF, choice offered, patient chose [facility] for [facility] services, Stanton Kidney with [facility] called for arrangements.  Expected Discharge Date:      11/07/2015            Expected Discharge Plan:  [place]  Discharge planning Services  CM Consult  Choice offered to:  Adult Children    HH Arranged:  RN, Disease Management, PT [place] Agency:  [facility]  Status of Service:  Completed, signed off  Sherrilyn Rist [phone] 11/07/2015, 3:12 PM

## 2015-11-07 NOTE — Discharge Summary (Signed)
Patient: [name] / Admit Date: 11/04/2015 / Date of Encounter: 11/07/2015, 11:15 AM   Subjective: No CP or SOB. Resting comfortably in chair. Son reports she spends majority of time in bed at home lately.   Objective: Telemetry: NSR Physical Exam: Blood pressure 134/65, pulse 64, temperature 98.3 F (36.8 C), temperature source Oral, resp. rate 18, height 5\' 2"  (1.575 m), weight 147 lb (66.679 kg), last menstrual period 12/19/1968, SpO2 99 %. General: Frail elderly AAF in no acute distress. Head: Normocephalic, atraumatic, sclera non-icteric, no xanthomas, nares are without discharge. Neck: JVP not elevated. Lungs: Clear bilaterally to auscultation without wheezes, rales, or rhonchi. Breathing is unlabored. Heart: RRR S1 S2. Soft SEM, no rubs or gallops. Abdomen: Soft, non-tender, non-distended with normoactive bowel sounds. No rebound/guarding. Extremities: No clubbing or cyanosis. Trace-1+ BLE edema. Distal pedal pulses are 2+ and equal bilaterally. Neuro: Alert and oriented X 3. Moves all extremities spontaneously. Psych:  Responds to questions appropriately with a normal affect.   Intake/Output Summary (Last 24 hours) at 11/07/15 1115 Last data filed at 11/07/15 0802  Gross per 24 hour  Intake    900 ml  Output   2302 ml  Net  -1402 ml    Inpatient Medications:  . albuterol  2.5 mg Nebulization BID  . amLODipine  5 mg Oral Daily  . aspirin  81 mg Oral Daily  . atorvastatin  40 mg Oral q1800  . heparin  5,000 Units Subcutaneous 3 times per day  . hydrALAZINE  12.5 mg Oral 3 times per day  . insulin aspart  0-15 Units Subcutaneous TID WC  . insulin aspart  0-5 Units Subcutaneous QHS  . insulin glargine  7 Units Subcutaneous QHS  . isosorbide mononitrate  60 mg Oral Daily  . metoprolol succinate  25 mg Oral Daily  . nitroGLYCERIN  1 inch Topical Once  . pantoprazole  40 mg Oral Daily  . potassium chloride SA  20 mEq Oral Daily  . sodium chloride  3 mL Intravenous  Q12H  . sodium chloride  3 mL Intravenous Q12H  . torsemide  20 mg Oral Daily   Infusions:    Labs:  Recent Labs  11/06/15 0718 11/07/15 0556  NA 140 139  K 4.0 3.7  CL 105 103  CO2 24 25  GLUCOSE 226* 129*  BUN 30* 33*  CREATININE 1.77* 1.87*  CALCIUM 9.2 9.2    Recent Labs  11/06/15 0718 11/07/15 0556  AST 19 18  ALT 21 20  ALKPHOS 218* 189*  BILITOT 1.1 0.8  PROT 6.6 6.2*  ALBUMIN 3.2* 2.9*    Recent Labs  11/04/15 1350 11/05/15 0411 11/06/15 0718  WBC 4.1 4.0 3.5*  NEUTROABS 2.7  --   --   HGB 11.2* 10.2* 11.2*  HCT 34.9* 31.8* 34.7*  MCV 85.1 85.3 85.3  PLT 235 216 202    Recent Labs  11/04/15 2011 11/05/15 0411  TROPONINI 0.04* 0.04*   Invalid input(s): POCBNP  Recent Labs  11/05/15 0411  HGBA1C 9.8*     Radiology/Studies:  Dg Chest 2 View  11/04/2015  CLINICAL DATA:  Left-sided chest pain. History of nonischemic cardiomyopathy and congestive heart failure. EXAM: CHEST - 2 VIEW COMPARISON:  04/04/2015 FINDINGS: The heart is diffusely enlarged. There is evidence of congestive heart failure with diffuse interstitial edema present. There likely are small bilateral pleural effusions. No focal airspace disease or pneumothorax identified. The bony thorax is unremarkable. IMPRESSION: Significant cardiomegaly with evidence of  CHF/interstitial edema and small bilateral pleural effusions. Electronically Signed   By: Aletta Edouard M.D.   On: 11/04/2015 14:39     Assessment and Plan  4F with HTN, HLD, DM, CKD stage III, history of stroke, NICM, chronic combined CHF, severe MR, nonobstructive CAD 2011), prior GIB, NSVT, memory difficulties presented with acute on chronic combined HF and chest pain. 2D echo 11/05/15: mild LVH, EF 30-35%, diffuse HK with akinesis of ant & inf septum, grade 2 DD, high vent filling pressure, mild AI, severe MR, severe LAE, severe TR, mod PR, trivial pericardial effusion. Note that "atrial fib" is listed in her problem list  but not mentioned in cardiology notes - event monitor 2015 was negative for atrial fib.  1. Acute on chronic combined CHF - weights inconsistent ?[phone]. I/O's -3.5L. Agree with switch to oral torsemide. Not on ACEI/ARB due to #4.  2. Severe MR, severe TR, mod PR - previously not felt to be a surgical candidate. Continue med rx. F/u with primary cardiologist.  3. HTN - controlled on present regimen.  4. CKD stage IV - Cr appears to be hovering around baseline of 1.7-2.2 this year.  5. Chest pain - likely in setting of #1. Flat trend to 0.04, not ACS pattern. Given comorbidities including CKD (and prior history of ARF), anticipate continued medical management.  Continue ASA, BB, Imdur. No further discomfort reported.  This is my first day seeing her but anticipate nearing max benefit of inpatient hospitalization. May be nearing d/c.  Signed, Melina Copa PA-C Pager: [phone]   Personally seen and examined. Agree with above. Continue with medical mgt. Torsemide ? DC tomorrow.   Candee Furbish, MD

## 2015-11-07 NOTE — Consult Note (Signed)
   [facility] Inpatient Consult   11/07/2015  [name] April 07, 1933 [id number] Patient evaluated for community based chronic disease management services with [place] Management Program as a benefit of patient's [facility]. Spoke with patient's son, Ronalee Belts, at bedside to explain [place] Management services. Consent form sign. Patient will receive post hospital discharge call and will be evaluated for monthly home visits for assessments and disease process education.  Left contact information and [facility] literature at bedside. Made Inpatient Case Manager aware that [facility] Management following. Of note, [facility] Care Management services does not replace or interfere with any services that are arranged by inpatient case management or social work.  For additional questions or referrals please contact:   Natividad Brood, RN BSN [facility] Liaison  [phone] business mobile phone Toll free office [phone]

## 2015-11-07 NOTE — Discharge Summary (Signed)
Physician Discharge Summary  [name] [id number] DOB: 1933-05-21 DOA: 11/04/2015  PCP: [name], MD  Admit date: 11/04/2015 Discharge date: 11/07/2015  Time spent: 35 minutes  Recommendations for Outpatient Follow-up:  1. Daily weights- discussed with family 2. 24 hour supervision   Discharge Diagnoses:  Principal Problem:   Acute on chronic combined systolic and diastolic heart failure (HCC) Active Problems:   DM (diabetes mellitus), type 2, uncontrolled (HCC)   Iron deficiency anemia secondary to blood loss (chronic)   Essential hypertension   GERD   CAD (coronary artery disease)   Memory difficulties   CKD (chronic kidney disease) stage 4, GFR 15-29 ml/min (HCC)   Dyspnea   Chest pain   Diabetes mellitus due to underlying condition with hyperosmolarity without coma, without long-term current use of insulin (HCC)   Paroxysmal atrial fibrillation (HCC)   Severe mitral regurgitation   Severe tricuspid regurgitation   Moderate pulmonary valve insufficiency   Discharge Condition: improved  Diet recommendation: cardiac  Filed Weights   11/05/15 0701 11/06/15 0658 11/07/15 0624  Weight: 69.945 kg (154 lb 3.2 oz) 66.316 kg (146 lb 3.2 oz) 66.679 kg (147 lb)    History of present illness:  82 year old thin appearing widowed [place] American female She has been seen by Dr. Aldona Bar in the past. She's a history of nonischemic myopathy documented by cardiac catheterization performed at Dr. Debara Pickett 05/24/10. She had minimal CAD with an ejection fraction of 20%. Her other problems include treated hypertension, hyperlipidemia and diabetes. She has had a stroke in the past and walks with the aid of a walker. She has been hospitalized once in 2016 for congestive heart failure. She has previously been hospitalized with a stroke and a non-STEMI. Consideration was given to putting her on oral anticoagulant event monitor was obtained which did not show A. Fib with rapid PVCs  and a short burst of nonsustained ventricular tachycardia. She was primarily a bed to chair existence in her home and infrequently gets out of the house.. Presented by EMS 11/04/15 with chest pain and shortness of breath. Admitted for congestive heart failure, chest x-ray shows cardiomegaly with interstitial edema and bilateral pleural effusions   Hospital Course:  Acute on chronic combined CHF -  -oral torsemide. Not on ACEI/ARB due to CKD.  Severe MR, severe TR, mod PR - previously not felt to be a surgical candidate. Continue med rx. F/u with primary cardiologist.  HTN - controlled on present regimen.  CKD stage IV - Cr appears at baseline of 1.7-2.2   Chest pain -  -Flat trend to 0.04, not ACS pattern.  -Given comorbidities including CKD (and prior history of ARF), anticipate continued medical management. Continue ASA, BB, Imdur.  -appreciate cards  Procedures:    Consultations:  cards  Discharge Exam: Filed Vitals:   11/07/15 0624 11/07/15 1148  BP: 134/65 178/79  Pulse: 64 67  Temp: 98.3 F (36.8 C) 98.2 F (36.8 C)  Resp: 18 18    General: awake, NAD- family updated at bedside- anxious to go home   Discharge Instructions   Discharge Instructions    (HEART FAILURE PATIENTS) Call MD:  Anytime you have any of the following symptoms: 1) 3 pound weight gain in 24 hours or 5 pounds in 1 week 2) shortness of breath, with or without a dry hacking cough 3) swelling in the hands, feet or stomach 4) if you have to sleep on extra pillows at night in order to breathe.  Complete by:  As directed      AMB Referral to [place] Management    Complete by:  As directed   Reason for consult:  Post hospital follow up, [employer] high risk,  Expected date of contact:  1-3 days (reserved for hospital discharges)  Please assign to community nurse for post hospital transition of care program.  Patient has 7 hours of personal care services a week.  She may discharge with home health.  Patient was weak with history of falls.  For questions, please contact: Natividad Brood, RN BSN [facility] Liaison  [phone] business mobile phone Toll free office [phone]     Diet - low sodium heart healthy    Complete by:  As directed      Diet Carb Modified    Complete by:  As directed      Increase activity slowly    Complete by:  As directed           Discharge Medication List as of 11/07/2015  4:05 PM    CONTINUE these medications which have CHANGED   Details  insulin glargine (LANTUS) 100 UNIT/ML injection Inject 0.07 mLs (7 Units total) into the skin at bedtime., Starting 11/07/2015, Until Discontinued, No Print    torsemide (DEMADEX) 20 MG tablet 20 mg alternating with 40 mg daily, Print      CONTINUE these medications which have NOT CHANGED   Details  acetaminophen (TYLENOL) 500 MG tablet Take 500 mg by mouth every 6 (six) hours as needed for moderate pain or headache., Until Discontinued, Historical Med    albuterol (PROAIR HFA) 108 (90 BASE) MCG/ACT inhaler Inhale 2 puffs into the lungs every 6 (six) hours as needed for wheezing., Starting 05/25/2014, Until Discontinued, Print    amLODipine (NORVASC) 5 MG tablet TAKE 1 TABLET EVERY DAY, Normal    aspirin 81 MG tablet Take 81 mg by mouth daily., Until Discontinued, Historical Med    atorvastatin (LIPITOR) 40 MG tablet Take 1 tablet (40 mg total) by mouth daily at 6 PM., Starting 05/25/2014, Until Discontinued, Print    hydrALAZINE (APRESOLINE) 25 MG tablet Take 0.5 tablets (12.5 mg total) by mouth every 8 (eight) hours., Starting 02/19/2015, Until Discontinued, Normal    isosorbide mononitrate (IMDUR) 60 MG 24 hr tablet Take 1 tablet (60 mg total) by mouth daily., Starting 05/25/2014, Until Discontinued, Print    KLOR-CON 10 10 MEQ tablet Take 20 mEq by mouth daily. , Starting 10/09/2014, Until Discontinued, Historical Med    loratadine (ALLERGY RELIEF) 10 MG tablet Take 10 mg by mouth daily.,  Until Discontinued, Historical Med    magnesium oxide (MAG-OX) 400 (241.3 MG) MG tablet Take 1 tablet (400 mg total) by mouth 2 (two) times daily., Starting 05/25/2014, Until Discontinued, Print    metoprolol succinate (TOPROL-XL) 25 MG 24 hr tablet TAKE 1 TABLET (25 MG TOTAL) BY MOUTH DAILY., Normal    omeprazole (PRILOSEC) 20 MG capsule Take 1 capsule (20 mg total) by mouth daily before breakfast., Starting 05/25/2014, Until Discontinued, Print    polyethylene glycol (MIRALAX / GLYCOLAX) packet Take 17 g by mouth daily as needed. , Until Discontinued, Historical Med       Allergies  Allergen Reactions  . Baking Soda-Fluoride [Sodium Fluoride] Nausea And Vomiting  . Magnesium Hydroxide Nausea And Vomiting   Follow-up Information    Follow up with [name], MD On 11/19/2015.   Specialty:  Family Medicine   Why:  At 3:00pm  Contact information:   [address] [place] [address] [phone]       Follow up with [facility].   Specialty:  [place]   Why:  They will do your home health care at your home   Contact information:   [facility] [address] [phone]        The results of significant diagnostics from this hospitalization (including imaging, microbiology, ancillary and laboratory) are listed below for reference.    Significant Diagnostic Studies: Dg Chest 2 View  11/04/2015  CLINICAL DATA:  Left-sided chest pain. History of nonischemic cardiomyopathy and congestive heart failure. EXAM: CHEST - 2 VIEW COMPARISON:  04/04/2015 FINDINGS: The heart is diffusely enlarged. There is evidence of congestive heart failure with diffuse interstitial edema present. There likely are small bilateral pleural effusions. No focal airspace disease or pneumothorax identified. The bony thorax is unremarkable. IMPRESSION: Significant cardiomegaly with evidence of CHF/interstitial edema and small bilateral pleural  effusions. Electronically Signed   By: Aletta Edouard M.D.   On: 11/04/2015 14:39    Microbiology: No results found for this or any previous visit (from the past 240 hour(s)).   Labs: Basic Metabolic Panel:  Recent Labs Lab 11/04/15 1350 11/05/15 0411 11/06/15 0718 11/07/15 0556  NA 143 142 140 139  K 4.9 3.8 4.0 3.7  CL 109 107 105 103  CO2 25 25 24 25   GLUCOSE 125* 94 226* 129*  BUN 27* 26* 30* 33*  CREATININE 1.74* 1.76* 1.77* 1.87*  CALCIUM 9.1 8.8* 9.2 9.2   Liver Function Tests:  Recent Labs Lab 11/06/15 0718 11/07/15 0556  AST 19 18  ALT 21 20  ALKPHOS 218* 189*  BILITOT 1.1 0.8  PROT 6.6 6.2*  ALBUMIN 3.2* 2.9*   No results for input(s): LIPASE, AMYLASE in the last 168 hours. No results for input(s): AMMONIA in the last 168 hours. CBC:  Recent Labs Lab 11/04/15 1350 11/05/15 0411 11/06/15 0718  WBC 4.1 4.0 3.5*  NEUTROABS 2.7  --   --   HGB 11.2* 10.2* 11.2*  HCT 34.9* 31.8* 34.7*  MCV 85.1 85.3 85.3  PLT 235 216 202   Cardiac Enzymes:  Recent Labs Lab 11/04/15 2011 11/05/15 0411  TROPONINI 0.04* 0.04*   BNP: BNP (last 3 results)  Recent Labs  04/04/15 1100 11/04/15 1350  BNP 1383.6* 1841.7*    ProBNP (last 3 results) No results for input(s): PROBNP in the last 8760 hours.  CBG:  Recent Labs Lab 11/06/15 1110 11/06/15 1622 11/06/15 2030 11/07/15 0631 11/07/15 1146  GLUCAP 179* 165* 142* 108* 133*       Signed:  Juandiego Kolenovic U Johnta Couts DO.  [facility] 11/09/2015, 1:02 PM

## 2015-11-08 ENCOUNTER — Other Ambulatory Visit: Payer: Self-pay

## 2015-11-09 ENCOUNTER — Encounter: Payer: Self-pay | Admitting: *Deleted

## 2015-11-09 ENCOUNTER — Other Ambulatory Visit: Payer: Self-pay | Admitting: *Deleted

## 2015-11-09 NOTE — Progress Notes (Signed)
This encounter was created in error - please disregard.

## 2015-11-09 NOTE — Patient Outreach (Signed)
Referral received from hospital liaison to initiate involvement with community care management.  Member recently discharged from hospital on 1/25 with diagnosis of congestive heart failure.  According to chart, she also has history of stroke, hypertension, diabetes, kidney disease and mitral regurgitation.  Notified by hospital liaison that son was the primary contact.  Call placed to son, [name], to start transition of care program, automated message stating that the caller was not receiving calls at this time noted.  Will make second attempt next week.  Valente David, BSN, [place] Management  [facility] Care Manager [phone]

## 2015-11-12 ENCOUNTER — Other Ambulatory Visit: Payer: Self-pay | Admitting: Cardiovascular Disease

## 2015-11-12 NOTE — Telephone Encounter (Signed)
Rx refill sent to pharmacy. 

## 2015-11-13 ENCOUNTER — Other Ambulatory Visit: Payer: Self-pay | Admitting: *Deleted

## 2015-11-13 NOTE — Patient Outreach (Signed)
Second attempt to contact member and/or contact person, son [name].  Number provided for [name] [phone])  has recording not accepting calls at this time and unable to leave a message, second attempt for this number.  Attempt made to contact member at alternate number, [phone], but no answer.  HIPPA compliant voice message left.  Will await call back and make third attempt to contact member on Thursday, 2/2.  Valente David, BSN, [place] Management  [facility] Care Manager [phone]

## 2015-11-16 ENCOUNTER — Encounter: Payer: Self-pay | Admitting: *Deleted

## 2015-11-16 ENCOUNTER — Other Ambulatory Visit: Payer: Self-pay | Admitting: *Deleted

## 2015-11-16 NOTE — Patient Outreach (Signed)
3rd attempt made to contact member at both her listed number ([phone])and her son, [name], contact number [phone]).  Son's number states that he is not receiving calls at this time, Member's number immediately went to voice mail.  HIPPA compliant voice message left.  Will await call back, will send outreach letter.  If no response in 10 days, will close case.    Valente David, BSN, [place] Management  [facility] Care Manager [phone]

## 2015-11-23 ENCOUNTER — Other Ambulatory Visit: Payer: Self-pay | Admitting: *Deleted

## 2015-11-23 DIAGNOSIS — I5040 Unspecified combined systolic (congestive) and diastolic (congestive) heart failure: Secondary | ICD-10-CM

## 2015-11-23 DIAGNOSIS — I10 Essential (primary) hypertension: Secondary | ICD-10-CM

## 2015-11-23 DIAGNOSIS — E1169 Type 2 diabetes mellitus with other specified complication: Secondary | ICD-10-CM

## 2015-11-23 NOTE — Patient Outreach (Signed)
Call received from [facility] son, [name], stating that he had received the [facility] outreach letter sent to member after 3 unsuccessful attempts to contact.  He expresses interest in involvement with [facility] care management, apologizing for not initially making contact, stating that his phone number has changed.  He reports that his mother has been doing well since discharge.  She is involved with home health services through [facility].  Per son, member weighs self daily, denies any weight gain.  He states that either he or his sisters are available to assist her with her medications daily, and they also provide transportation to appointments.  Son states that she need more assistance with personal care, and he would like to set up alternate transportation in the event he or his sisters are unable to take her to appointments.  Discussed the possibility of using [employer], medicaid transportation, or [employer] transportation.  Made aware that social worker would be consulted for community resources.  He is also interested in having pharmacy assistance to determine if there is a better way to pay for her medications, stating that sometimes his sisters have to help pay for the member's medications.  Made aware that pharmacy will be consulted also.  He agrees to have an initial home visit, and to weekly transition of care calls.  He denies any further concerns at this time.  Contact information provided, encouraged to contact with any questions.  Valente David, BSN, [place] Management  [facility] Care Manager [phone]

## 2015-11-23 NOTE — Addendum Note (Signed)
Addended byValente David on: 11/23/2015 05:03 PM   Modules accepted: Orders

## 2015-11-27 ENCOUNTER — Encounter: Payer: Self-pay | Admitting: *Deleted

## 2015-11-30 ENCOUNTER — Other Ambulatory Visit: Payer: Self-pay | Admitting: *Deleted

## 2015-11-30 NOTE — Patient Outreach (Signed)
Weekly transition of care call placed to son, [name], who is the [employer] caregiver.  He reports that the member is doing well and that he has no concerns at this time.  She continues to receive visits from [facility] twice a week, and her weight is now down to 146 pounds (down 2 pounds from last week).  He denies being contacted yet from the social worker and/or the pharmacist, made aware that the referrals have been made and they will contact soon as they have 10 business days to contact.  He verbalizes understanding.  He denies any urgent needs at this time, will continue with weekly calls next week.  Valente David, BSN, [place] Management  [facility] Care Manager [phone]

## 2015-12-06 ENCOUNTER — Encounter: Payer: Self-pay | Admitting: *Deleted

## 2015-12-06 ENCOUNTER — Other Ambulatory Visit: Payer: Self-pay | Admitting: *Deleted

## 2015-12-06 NOTE — Progress Notes (Signed)
This encounter was created in error - please disregard.

## 2015-12-06 NOTE — Patient Outreach (Signed)
Weekly transition of care call placed to [facility] caregiver, son [name].  He reports that the member is sleeping at this time, but that she is "doing very well."  He reports another decrease in her weight this week, and state that she has had no ongoing swelling, chest pain or discomfort.  He does report that she has some shortness of breath at times but is relieved with using her inhaler without problems.  He voices no concerns at this time.  Initial home visit verified for Monday.  Encouraged to contact this care manager with any questions.  Valente David, BSN, [place] Management  [facility] Care Manager [phone]

## 2015-12-06 NOTE — Patient Outreach (Signed)
[facility]) Care Management  12/06/2015  [name] March 25, 1933 [id number]  CSW received a new referral on patient from patient's RNCM with [place] Management, Valente David indicating that patient would benefit from social work services and resources to assist with referrals to various community agencies.  More specifically, [name] reported that patient is in need of PCS ([place]) through [facility], requesting that CSW assist with completion of an application.  Patient is also requesting transportation resources, in the event that her sisters are unavailable to transport her.   CSW made an initial attempt to try and contact patient today to perform phone assessment, as well as assess and assist with social needs and services, without success.  A HIPAA complaint message was left for patient on voicemail.  CSW is currently awaiting a return call.  Nat Christen, BSW, MSW, LCSW  Licensed Education officer, [facility]  Mailing [place] [address] [place], [place] [address] Physical Address-[address], [place], [place] [address] Toll Free Main # [phone] Fax # [phone] Cell # [phone]  Fax # [phone]  [name]  [employer]  Discrimination is Against the [employer]. and its subsidiaries comply with applicable Federal civil rights laws and do not discriminate on the basis of race, color, national origin, age, disability, or sex. [place] do not exclude people or treat them differently because of race, color, national origin, age, disability, or sex.    [employer]. and its subsidiaries provide:  . Free auxiliary aids and services, such as qualified sign language interpreters, video remote interpretation, and written information in other formats to people with disabilities when such auxiliary aids  and services are necessary to ensure an equal opportunity to participate. . Free language services to people whose primary language is not English when those services are necessary to provide meaningful access, such as translated documents or oral interpretation.    If you need these services, call [phone] or if you use a TTY, call 711.   If you believe that [employer]. and its subsidiaries have failed to provide these services or discriminated in another way on the basis of race, color, national origin, age, disability, or sex, you can file a Tourist information centre manager with:   Discrimination [place]riev[place] [address]   If you need help filing a grievance, call [phone] or if you use a TTY, call 711.  You can also file a civil rights complaint with the U.S. Department of Health and Financial controller, Office for Civil Rights electronically through the Office for Civil Rights Complaint Portal, available at OnSiteLending.nl.jsf, or by mail or phone at:   [place]. Department of Health and Human Services  [place], [place], [facility]  [place], [place]  [phone], [phone] (TDD)  Complaint forms are available at CutFunds.si [place]: ATTENTION: If you do not speak English, language assistance services, free of charge, are available to you. Call [phone] (TTY: [id number]).  Espaol (Spanish): ATENCIN: si habla espaol, tiene a su disposicin servicios gratuitos de asistencia lingstica. Llame al [phone] (TTY: [id number]). ???? (Chinese): ?????????????????????????????? [phone]?TTY: 711??  Ti?ng Vi?t (Vietnamese): CH : N?u b?n ni Ti?ng Vi?t, c cc d?ch v? h? tr? ngn ng? mi?n ph dnh cho b?n. G?i s? [phone] (TTY: [id number]).  ??? ([place]): ?? : ???? ????? ?? , ?? ?? ???? ??? ???? ? ???? .  [phone] (TTY: 711)??? ??? ????  .  Tagalog (Tagalog - Filipino): PAUNAWA: Kung nagsasalita ka ng Tagalog, maaari kang gumamit ng mga serbisyo ng tulong sa wika nang walang bayad. [place] sa [phone] (TTY: [id number]).   [place]): :      ,      .  [phone] (: [id number]).  Kreyl Ayisyen ([place]): ATANSYON: Si w pale Ethelene Hal, gen svis d pou lang ki disponib gratis pou ou. Rele [phone] (TTY: [id number]).  Fonnie Jarvis [place] Kitchen[place]): ATTENTION : Si vous parlez franais, des services d'aide linguistique vous sont proposs gratuitement. Appelez le [phone] (ATS : [id number]).  Polski (Polish): UWAGA: Jeeli mwisz po polsku, moesz skorzysta z bezpatnej pomocy jzykowej. Zadzwo pod numer [phone] (TTY: [id number]).  Portugus ([place]): ATENO: Se fala portugus, encontram-se disponveis servios lingusticos, grtis. Ligue para [phone] (TTY: [id number]).  Italiano ([place]): ATTENZIONE: In caso la lingua parlata sia l'italiano, sono disponibili servizi Cheryl assistenza linguistica gratuiti. [place]iam[place] il numero [phone] (TTY: [id number]).  Dawayne Patricia ([place]): ACHTUNG: Wenn Sie Deutsch sprechen,  stehen Ihnen kostenlos sprachliche Hilfsdienstleistungen zur [employer]. [place]: [phone] (TTY: [id number]).   (Arabic): [phone]   .            : .)[id number] :   (  ??? ([place]): ??????????????????????????????????[phone] ?TTY?711?????????????????  ? (Farsi): [phone]  . ?   ? ?  ? ? ?~ ?  ?    : .??  (TTY: 711)  Din Bizaad ([place]): D77 baa ak0 n7n7zin: D77 saad bee y1n7[ti'go Risa Grill, saad bee 1k1'1n7da'1wo'd66', t'11 Pricilla Loveless n1 h0l=, koj8' h0d77lnih [phone] (TTY:   [id number]).

## 2015-12-07 ENCOUNTER — Other Ambulatory Visit: Payer: Self-pay | Admitting: Pharmacist

## 2015-12-07 NOTE — Patient Outreach (Signed)
[facility]) Care Management  [place]   12/07/2015  [name] 02/13/1933 [id number]  Subjective: [name] is a 82 y.o. female referred to pharmacy for medication review and to discuss possible methods for medication cost savings. Reviewed patient's allergy, medication and problem list in order to perform this evaluation.  Call and speak with patient's son/caregiver, [name]. Reports that he and his siblings are able to manage with the cost of the patient's medications right now. Confirm with [name] that her prescriptions are currently being billed through both Medicare Part D and Medicaid. Note that per the Medicare.gov website, patient has [employer] coverage and her current subsidy for her medications is "Full Benefit Dual Eligible". [name] reports that the total cost of her medications adds up to about $14/month. Discuss with [name] the OTC benefit that the patient may have with [employer]. [name] reports that he is aware of this benefit and that they already use the OTC benefit to get her over the counter medications for the patient for free. [name] reports that there is no particular medication that is more expensive. Reports that they are able to afford her current medications.  Review with [name] each of the patient's medications including name, strength, indication and administration. [name] reports that the patient's blood sugars have been running from around 75 mg/dL before breakfast to up to 220 mg/dL before supper. Reports that patient has had some low blood sugars in the past, but not often. [name] correctly reports how to treat low blood sugars using half a glass of orange juice or regular soda and then rechecking her blood sugar.  [name] reports that he has no further concerns or questions for me at this time. Provide Michael with my phone number.  Objective:   Current Medications: Current Outpatient Prescriptions  Medication Sig Dispense Refill   . acetaminophen (TYLENOL) 500 MG tablet Take 500 mg by mouth every 6 (six) hours as needed for moderate pain or headache.    . albuterol (PROAIR HFA) 108 (90 BASE) MCG/ACT inhaler Inhale 2 puffs into the lungs every 6 (six) hours as needed for wheezing. 8.5 g 11  . amLODipine (NORVASC) 5 MG tablet TAKE 1 TABLET EVERY DAY 30 tablet 11  . aspirin 81 MG tablet Take 81 mg by mouth daily.    Marland Kitchen atorvastatin (LIPITOR) 40 MG tablet TAKE 1 TABLET (40 MG TOTAL) BY MOUTH DAILY AT 6 PM. 30 tablet 10  . hydrALAZINE (APRESOLINE) 25 MG tablet Take 0.5 tablets (12.5 mg total) by mouth every 8 (eight) hours. 90 tablet 3  . insulin glargine (LANTUS) 100 UNIT/ML injection Inject 0.07 mLs (7 Units total) into the skin at bedtime. 10 mL 11  . isosorbide mononitrate (IMDUR) 60 MG 24 hr tablet Take 1 tablet (60 mg total) by mouth daily. 30 tablet 2  . KLOR-CON 10 10 MEQ tablet Take 20 mEq by mouth daily.     . magnesium oxide (MAG-OX) 400 (241.3 MG) MG tablet Take 1 tablet (400 mg total) by mouth 2 (two) times daily. 60 tablet 1  . metoprolol succinate (TOPROL-XL) 25 MG 24 hr tablet TAKE 1 TABLET (25 MG TOTAL) BY MOUTH DAILY. 30 tablet 5  . omeprazole (PRILOSEC) 20 MG capsule Take 1 capsule (20 mg total) by mouth daily before breakfast. 30 capsule 11  . polyethylene glycol (MIRALAX / GLYCOLAX) packet Take 17 g by mouth daily as needed.     . torsemide (DEMADEX) 20 MG tablet 20 mg alternating with 40  mg daily 45 tablet 0  . loratadine (ALLERGY RELIEF) 10 MG tablet Take 10 mg by mouth daily. Reported on 12/07/2015     No current facility-administered medications for this visit.   Assessment:  Drugs sorted by system:   Cardiovascular: amlodipine, aspirin, atorvastatin, hydralazine, isosorbide mononitrate, metoprolol succinate, torsemide  Pulmonary/Allergy: albuterol  Gastrointestinal: omeprazole, Miralax  Endocrine: Lantus  Pain: acetaminophen  Vitamins/Minerals: Klor-con, magnesium oxide    Duplications  in therapy: none noted Gaps in therapy: none noted Medications to avoid in the elderly: none noted Drug interactions: no significant interactions noted Other issues noted: Marland Kitchen Per EPIC, lab work on 11/07/15 showed a serum creatinine of 1.87 mg/dL, with an eGFR of 28 mL/min. Medications currently appropriately renally dosed. . Patient not currently on ACE-I or ARB for heart failure treatment. Per ECHO from 11/05/15 showed EF of 30-35%. ACE-I or ARB therapy recommended in all patients with heart failure and a EF <40% due to improved mortality benefit. Per cardiology note from discharge on 11/07/15, Cardiologist has decided to not place patient on ACE-inhibitor/ARB due to her chronic kidney disease and baseline serum creatinine. . Per same Cardiology discharge note from 11/07/15, "note that "atrial fib" is listed in her problem list but not mentioned in cardiology notes - event monitor 2015 was negative for atrial fib." Per Cardiology note in EPIC from 02/19/15, patient no longer on coumadin due to GI bleed  Plan:  1) Medication assistance: patient already using her full benefit dual eligibility and over the counter medication benefit. Son/caregiver reports that they are able to afford her medications using these resources.  2) No further pharmacy concerns to address at this time. Will close pharmacy episode for now.  Harlow Asa, PharmD Clinical Pharmacist [facility] [phone]

## 2015-12-10 ENCOUNTER — Encounter: Payer: Self-pay | Admitting: *Deleted

## 2015-12-10 ENCOUNTER — Other Ambulatory Visit: Payer: Self-pay | Admitting: *Deleted

## 2015-12-10 NOTE — Patient Outreach (Signed)
[name]) Care Management   12/10/2015  AZZURE Hurley 1932-11-10 [id number]  [name] is an 82 y.o. female  Subjective:   Member states that she is "doing all right.  I just wish I could get out a little more, do a little more for myself."  Reports some pain in her feet during the day when walking, rates at 1/10, along with some swelling that is relieved with elevation.  Denies any chest pain, chest discomfort, or congestion.  Objective:   Review of Systems  Constitutional: Negative.   HENT: Negative.   Eyes: Negative.   Respiratory: Negative.   Cardiovascular: Positive for leg swelling.  Gastrointestinal: Negative.   Genitourinary: Negative.   Musculoskeletal: Negative.   Skin: Negative.   Neurological: Negative.   Endo/Heme/Allergies: Negative.   Psychiatric/Behavioral: Negative.     Physical Exam  Constitutional: She is oriented to person, place, and time. She appears well-developed and well-nourished.  Neck: Normal range of motion.  Cardiovascular: Normal rate, regular rhythm and normal heart sounds.   Respiratory: Effort normal and breath sounds normal.  GI: Soft. Bowel sounds are normal.  Musculoskeletal: Normal range of motion.  Neurological: She is alert and oriented to person, place, and time.  Skin: Skin is warm and dry.    BP 138/72 mmHg  Pulse 58  Resp 18  Ht 1.575 m (_0 )  Wt 148 lb (67.132 kg)  BMI 27.06 kg/m2  SpO2 96%  LMP 12/19/1968   Current Medications:   Current Outpatient Prescriptions  Medication Sig Dispense Refill  . acetaminophen (TYLENOL) 500 MG tablet Take 500 mg by mouth every 6 (six) hours as needed for moderate pain or headache.    . albuterol (PROAIR HFA) 108 (90 BASE) MCG/ACT inhaler Inhale 2 puffs into the lungs every 6 (six) hours as needed for wheezing. 8.5 g 11  . amLODipine (NORVASC) 5 MG tablet TAKE 1 TABLET EVERY DAY 30 tablet 11  . aspirin 81 MG tablet Take 81 mg by mouth daily.    Marland Kitchen  atorvastatin (LIPITOR) 40 MG tablet TAKE 1 TABLET (40 MG TOTAL) BY MOUTH DAILY AT 6 PM. 30 tablet 10  . hydrALAZINE (APRESOLINE) 25 MG tablet Take 0.5 tablets (12.5 mg total) by mouth every 8 (eight) hours. 90 tablet 3  . insulin glargine (LANTUS) 100 UNIT/ML injection Inject 0.07 mLs (7 Units total) into the skin at bedtime. 10 mL 11  . isosorbide mononitrate (IMDUR) 60 MG 24 hr tablet Take 1 tablet (60 mg total) by mouth daily. 30 tablet 2  . KLOR-CON 10 10 MEQ tablet Take 20 mEq by mouth daily.     Marland Kitchen loratadine (ALLERGY RELIEF) 10 MG tablet Take 10 mg by mouth daily. Reported on 12/07/2015    . magnesium oxide (MAG-OX) 400 (241.3 MG) MG tablet Take 1 tablet (400 mg total) by mouth 2 (two) times daily. 60 tablet 1  . metoprolol succinate (TOPROL-XL) 25 MG 24 hr tablet TAKE 1 TABLET (25 MG TOTAL) BY MOUTH DAILY. 30 tablet 5  . omeprazole (PRILOSEC) 20 MG capsule Take 1 capsule (20 mg total) by mouth daily before breakfast. 30 capsule 11  . polyethylene glycol (MIRALAX / GLYCOLAX) packet Take 17 g by mouth daily as needed.     . torsemide (DEMADEX) 20 MG tablet 20 mg alternating with 40 mg daily 45 tablet 0   No current facility-administered medications for this visit.    Functional Status:   In your present state of health, do  you have any difficulty performing the following activities: 12/10/2015 11/05/2015  Hearing? Y N  Vision? Y N  Difficulty concentrating or making decisions? Y N  Walking or climbing stairs? Y Y  Dressing or bathing? Y N  Doing errands, shopping? Y N  Preparing Food and eating ? Y -  Using the Toilet? N -  In the past six months, have you accidently leaked urine? Y -  Do you have problems with loss of bowel control? N -  Managing your Medications? Y -  Managing your Finances? Y -  Housekeeping or managing your Housekeeping? Y -    Fall/Depression Screening:    PHQ 2/9 Scores 12/10/2015 03/02/2012 02/26/2012 12/12/2011 10/03/2011 09/03/2011 04/10/2011  PHQ - 2 Score 2 0  0 0 0 0 0  PHQ- 9 Score 12 - - - - - -   Fall Risk  12/10/2015 06/04/2015 03/02/2012 02/26/2012 12/12/2011  Falls in the past year? Yes Yes - - -  Number falls in past yr: 2 or more - - - -  Injury with Fall? Yes - - - -  Risk Factor Category  High Fall Risk - - - -  Risk for fall due to : History of fall(s);Impaired balance/gait - Impaired balance/gait;Impaired mobility Impaired balance/gait;Impaired mobility;Impaired vision;History of fall(s);Medication side effect Impaired balance/gait  Risk for fall due to (comments): - - - - uses a walker.  Follow up Falls prevention discussed - - - -    Assessment:    Met with member for initial home visit, son, Ronalee Belts, present during visit.  Member is slightly hard of hearing, but able to answer most questions independently.  Member continues to have home visits from home health nursing and physical therapy.  Son monitors her blood pressure, weight, oxygen level, and blood sugar daily.  He does not record at this time due to telemonitoring, but will start once telemonitoring equipment is retrieved.  Provided with [facility] calendar book for records.  Congestive heart failure zones discussed, son reports that he is familiar with action plan.    Transition of care program complete.  Offered additional assistance with education on heart failure and/or diabetes through health coach.  Son state "I've been through this for years.  I think I got it down pretty good."  Son expresses concern regarding the need for addition assistance for personal care.  He denies being contact by the Education officer, museum.  Made aware that J. Saporito attempted to contact him last week but was unsuccessful.  Contact information for Ms. Saporito provided, son state that he will contact.    This care manager provided contact information, encouraged son to contact with questions or if additional help/resources is needed.  He verbalizes understanding.  Plan:   Will send PCP notes from today's  visit. Will notify social worker of member's concerns and provided new phone number, and notify her that this care manage will close case.  THN CM Care Plan Problem One        Most Recent Value   Care Plan Problem One  Recent hospitalization   Role Documenting the Problem One  Care Management [place] for Problem One  Active   THN Long Term Goal (31-90 days)  Member will not be readmitted to hospital within the next 31 days   THN Long Term Goal Start Date  11/09/15   [facility] Long Term Goal Met Date  12/10/15   Interventions for Problem One Long Term Goal  Discussed with  member the importance of following discharge instructions, including follow up appointments, medications, diet, and home health involvement, to decrease the risk of readmission   THN CM Short Term Goal #1 (0-30 days)  [place] will report taking all medications as prescribed within the next 4 weeks   THN CM Short Term Goal #1 Start Date  11/23/15   [facility] CM Short Term Goal #1 Met Date  12/10/15   Interventions for Short Term Goal #1  Discussed with member the importance of following discharge instructions, including follow up appointments, medications, diet, and home health involvement, to decrease the risk of readmission   THN CM Short Term Goal #2 (0-30 days)  Member will report taking weights daily and recording daily for the next 4 weeks   THN CM Short Term Goal #2 Start Date  11/23/15   [facility] CM Short Term Goal #2 Met Date  12/10/15   Interventions for Short Term Goal #2  Discussed with member the importance of following discharge instructions, including follow up appointments, medications, diet, and home health involvement, to decrease the risk of readmission     Valente David, BSN, [place] Manager [phone]

## 2015-12-11 ENCOUNTER — Encounter: Payer: Self-pay | Admitting: *Deleted

## 2015-12-13 ENCOUNTER — Other Ambulatory Visit: Payer: Self-pay | Admitting: *Deleted

## 2015-12-13 NOTE — Patient Outreach (Signed)
[facility]) Care Management  12/13/2015  DACIA HALLEY 1933-07-31 [id number]   CSW received a message from patient's RNCM with [facility], Valente David reporting that patient is no longer in need of social work services and resources.  CSW will submit a case closure request to Lurline Del, Care Management Assistant with [facility], in the form of an In [employer].  CSW will ensure that Mrs. [name] is aware of Roma Schanz, RNCM with [facility], continued involvement with patient's care.  Nat Christen, BSW, MSW, LCSW  Licensed Education officer, environmental Health System  [place] [address], [place], [place] [address] Physical Address-[address], [place],  [address] Toll Free Main # [phone] Fax # [phone] Cell # [phone]  Fax # [phone]  [email or web address]  [employer]  Discrimination is Against the [employer]. and its subsidiaries comply with applicable Federal civil rights laws and do not discriminate on the basis of race, color, national origin, age, disability, or sex. [employer] do not exclude people or treat them differently because of race, color, national origin, age, disability, or sex.    [employer]. and its subsidiaries provide:  . Free auxiliary aids and services, such as qualified sign language interpreters, video remote interpretation, and written information in other formats to people with disabilities when such auxiliary aids and services are necessary to ensure an equal opportunity to participate. . Free language services to people whose primary language is not English when those services are necessary to provide meaningful access, such as translated documents or oral interpretation.    If you need these services, call [phone] or if you use a  TTY, call 711.   If you believe that [employer]. and its subsidiaries have failed to provide these services or discriminated in another way on the basis of race, color, national origin, age, disability, or sex, you can file a Tourist information centre manager with:   Discrimination [place] [address]   If you need help filing a grievance, call [phone] or if you use a TTY, call 711.  You can also file a civil rights complaint with the U.S. Department of Health and Financial controller, Office for Civil Rights electronically through the Office for Civil Rights Complaint Portal, available at OnSiteLending.nl.jsf, or by mail or phone at:   [place]. Department of Health and Human Services  [place], [place], [facility]  [place], [place]  [phone], [phone] (TDD)  Complaint forms are available at CutFunds.si [place]: ATTENTION: If you do not speak English, language assistance services, free of charge, are available to you. Call [phone] (TTY: [id number]).  Espaol (Spanish): ATENCIN: si habla espaol, tiene a su disposicin servicios gratuitos de asistencia lingstica. Llame al [phone] (TTY: [id number]). ???? (Chinese): ?????????????????????????????? [phone]?TTY: 711??  Ti?ng Vi?t (Vietnamese): CH : N?u b?n ni Ti?ng Vi?t, c cc d?ch v? h? tr? ngn ng? mi?n ph dnh cho b?n. G?i s? [phone] (TTY: [id number]).  ??? ([place]): ?? : ???? ????? ?? , ?? ?? ???? ??? ???? ? ???? . [phone] (TTY: 711)??? ??? ???? .  Tagalog (Tagalog - Filipino): PAUNAWA: Kung nagsasalita ka ng Tagalog, maaari kang gumamit ng mga serbisyo ng tulong sa wika nang walang bayad. [place] sa [phone] (TTY: [id number]).   ([place]): :      ,      .  [phone] (:  [id number]).  Kreyl Ayisyen ([place]): ATANSYON: Si w pale Ethelene Hal, gen svis d pou lang ki disponib gratis pou ou. Rele [phone] (TTY: [id number]).  Fonnie Jarvis Marland Kitchen[place]): ATTENTION : Si vous parlez franais, des services d'aide linguistique vous sont proposs gratuitement. Appelez le [phone] (ATS : [id number]).  Polski (Polish): UWAGA: Jeeli mwisz po polsku, moesz skorzysta z bezpatnej pomocy jzykowej. Zadzwo pod numer [phone] (TTY: [id number]).  Portugus ([place]): ATENO: Se fala portugus, encontram-se disponveis servios lingusticos, grtis. Ligue para [phone] (TTY: [id number]).  Italiano ([place]): ATTENZIONE: In caso la lingua parlata sia l'italiano, sono disponibili servizi di assistenza linguistica gratuiti. Chiamare il numero [phone] (TTY: [id number]).  Dawayne Patricia ([place]): ACHTUNG: Wenn Sie Deutsch sprechen,  stehen Ihnen kostenlos sprachliche Hilfsdienstleistungen zur [employer]. [place]: [phone] (TTY: [id number]).   (Arabic): [phone]   .            : .)[id number] :   (  ??? ([place]): ??????????????????????????????????[id number] ?TTY?711?????????????????  ? (Farsi): [phone]  . ?   ? ?  ? ? ?~ ?  ?    : .??  (TTY: 711)  Din Bizaad ([place]ajo): D77 baa ak0 n7n7zin: D77 saad bee y1n7[ti'go Risa Grill, saad bee 1k1'1n7da'1wo'd66', t'11 Pricilla Loveless n1 h0l=, koj8' h0d77lnih [phone] (TTY:   [id number]).

## 2015-12-20 ENCOUNTER — Encounter (HOSPITAL_COMMUNITY): Payer: Self-pay | Admitting: Emergency Medicine

## 2015-12-20 ENCOUNTER — Inpatient Hospital Stay (HOSPITAL_COMMUNITY)
Admission: EM | Admit: 2015-12-20 | Discharge: 2015-12-24 | DRG: 291 | Disposition: A | Discharge status: Home Health | Payer: Commercial Managed Care - HMO | Attending: Internal Medicine | Admitting: Internal Medicine

## 2015-12-20 ENCOUNTER — Emergency Department (HOSPITAL_COMMUNITY): Payer: Commercial Managed Care - HMO

## 2015-12-20 DIAGNOSIS — I081 Rheumatic disorders of both mitral and tricuspid valves: Secondary | ICD-10-CM | POA: Diagnosis present

## 2015-12-20 DIAGNOSIS — I509 Heart failure, unspecified: Secondary | ICD-10-CM

## 2015-12-20 DIAGNOSIS — Z8249 Family history of ischemic heart disease and other diseases of the circulatory system: Secondary | ICD-10-CM | POA: Diagnosis not present

## 2015-12-20 DIAGNOSIS — K219 Gastro-esophageal reflux disease without esophagitis: Secondary | ICD-10-CM | POA: Diagnosis present

## 2015-12-20 DIAGNOSIS — I480 Paroxysmal atrial fibrillation: Secondary | ICD-10-CM | POA: Diagnosis present

## 2015-12-20 DIAGNOSIS — R1024 Suprapubic pain: Secondary | ICD-10-CM | POA: Diagnosis present

## 2015-12-20 DIAGNOSIS — Z90710 Acquired absence of both cervix and uterus: Secondary | ICD-10-CM | POA: Diagnosis not present

## 2015-12-20 DIAGNOSIS — IMO0002 Reserved for concepts with insufficient information to code with codable children: Secondary | ICD-10-CM | POA: Diagnosis present

## 2015-12-20 DIAGNOSIS — E875 Hyperkalemia: Secondary | ICD-10-CM | POA: Diagnosis present

## 2015-12-20 DIAGNOSIS — I5023 Acute on chronic systolic (congestive) heart failure: Secondary | ICD-10-CM

## 2015-12-20 DIAGNOSIS — Z8542 Personal history of malignant neoplasm of other parts of uterus: Secondary | ICD-10-CM | POA: Diagnosis not present

## 2015-12-20 DIAGNOSIS — Z794 Long term (current) use of insulin: Secondary | ICD-10-CM | POA: Diagnosis not present

## 2015-12-20 DIAGNOSIS — I2510 Atherosclerotic heart disease of native coronary artery without angina pectoris: Secondary | ICD-10-CM | POA: Diagnosis present

## 2015-12-20 DIAGNOSIS — I130 Hypertensive heart and chronic kidney disease with heart failure and stage 1 through stage 4 chronic kidney disease, or unspecified chronic kidney disease: Secondary | ICD-10-CM | POA: Diagnosis not present

## 2015-12-20 DIAGNOSIS — G4733 Obstructive sleep apnea (adult) (pediatric): Secondary | ICD-10-CM | POA: Diagnosis present

## 2015-12-20 DIAGNOSIS — Z91048 Other nonmedicinal substance allergy status: Secondary | ICD-10-CM | POA: Diagnosis not present

## 2015-12-20 DIAGNOSIS — Z9841 Cataract extraction status, right eye: Secondary | ICD-10-CM

## 2015-12-20 DIAGNOSIS — J45909 Unspecified asthma, uncomplicated: Secondary | ICD-10-CM | POA: Diagnosis present

## 2015-12-20 DIAGNOSIS — I071 Rheumatic tricuspid insufficiency: Secondary | ICD-10-CM | POA: Diagnosis present

## 2015-12-20 DIAGNOSIS — E785 Hyperlipidemia, unspecified: Secondary | ICD-10-CM | POA: Diagnosis present

## 2015-12-20 DIAGNOSIS — R102 Pelvic and perineal pain: Secondary | ICD-10-CM | POA: Diagnosis present

## 2015-12-20 DIAGNOSIS — Z9842 Cataract extraction status, left eye: Secondary | ICD-10-CM

## 2015-12-20 DIAGNOSIS — I10 Essential (primary) hypertension: Secondary | ICD-10-CM | POA: Diagnosis not present

## 2015-12-20 DIAGNOSIS — E1122 Type 2 diabetes mellitus with diabetic chronic kidney disease: Secondary | ICD-10-CM | POA: Diagnosis present

## 2015-12-20 DIAGNOSIS — D500 Iron deficiency anemia secondary to blood loss (chronic): Secondary | ICD-10-CM | POA: Diagnosis present

## 2015-12-20 DIAGNOSIS — E1165 Type 2 diabetes mellitus with hyperglycemia: Secondary | ICD-10-CM | POA: Diagnosis present

## 2015-12-20 DIAGNOSIS — I639 Cerebral infarction, unspecified: Secondary | ICD-10-CM | POA: Diagnosis present

## 2015-12-20 DIAGNOSIS — I38 Endocarditis, valve unspecified: Secondary | ICD-10-CM | POA: Diagnosis not present

## 2015-12-20 DIAGNOSIS — Z961 Presence of intraocular lens: Secondary | ICD-10-CM | POA: Diagnosis present

## 2015-12-20 DIAGNOSIS — N184 Chronic kidney disease, stage 4 (severe): Secondary | ICD-10-CM | POA: Diagnosis present

## 2015-12-20 DIAGNOSIS — R1030 Lower abdominal pain, unspecified: Secondary | ICD-10-CM

## 2015-12-20 DIAGNOSIS — M810 Age-related osteoporosis without current pathological fracture: Secondary | ICD-10-CM | POA: Diagnosis present

## 2015-12-20 DIAGNOSIS — R0602 Shortness of breath: Secondary | ICD-10-CM | POA: Diagnosis present

## 2015-12-20 DIAGNOSIS — I272 Other secondary pulmonary hypertension: Secondary | ICD-10-CM | POA: Diagnosis present

## 2015-12-20 DIAGNOSIS — E1143 Type 2 diabetes mellitus with diabetic autonomic (poly)neuropathy: Secondary | ICD-10-CM | POA: Diagnosis present

## 2015-12-20 DIAGNOSIS — Z8673 Personal history of transient ischemic attack (TIA), and cerebral infarction without residual deficits: Secondary | ICD-10-CM

## 2015-12-20 DIAGNOSIS — I252 Old myocardial infarction: Secondary | ICD-10-CM

## 2015-12-20 DIAGNOSIS — I248 Other forms of acute ischemic heart disease: Secondary | ICD-10-CM | POA: Diagnosis present

## 2015-12-20 DIAGNOSIS — Z7982 Long term (current) use of aspirin: Secondary | ICD-10-CM | POA: Diagnosis not present

## 2015-12-20 DIAGNOSIS — I5043 Acute on chronic combined systolic (congestive) and diastolic (congestive) heart failure: Secondary | ICD-10-CM | POA: Diagnosis present

## 2015-12-20 DIAGNOSIS — Z79899 Other long term (current) drug therapy: Secondary | ICD-10-CM | POA: Diagnosis not present

## 2015-12-20 DIAGNOSIS — I429 Cardiomyopathy, unspecified: Secondary | ICD-10-CM | POA: Diagnosis present

## 2015-12-20 DIAGNOSIS — E118 Type 2 diabetes mellitus with unspecified complications: Secondary | ICD-10-CM | POA: Diagnosis not present

## 2015-12-20 LAB — I-STAT ARTERIAL BLOOD GAS, ED
ACID-BASE DEFICIT: 2 mmol/L (ref 0.0–2.0)
Bicarbonate: 22.8 mEq/L (ref 20.0–24.0)
O2 SAT: 94 %
PCO2 ART: 37.7 mmHg (ref 35.0–45.0)
PH ART: 7.389 (ref 7.350–7.450)
Patient temperature: 37.2
TCO2: 24 mmol/L (ref 0–100)
pO2, Arterial: 70 mmHg — ABNORMAL LOW (ref 80.0–100.0)

## 2015-12-20 LAB — BASIC METABOLIC PANEL
ANION GAP: 10 (ref 5–15)
BUN: 32 mg/dL — ABNORMAL HIGH (ref 6–20)
CALCIUM: 9.3 mg/dL (ref 8.9–10.3)
CHLORIDE: 108 mmol/L (ref 101–111)
CO2: 21 mmol/L — AB (ref 22–32)
Creatinine, Ser: 2.1 mg/dL — ABNORMAL HIGH (ref 0.44–1.00)
GFR, EST AFRICAN AMERICAN: 24 mL/min — AB (ref >60)
GFR, EST NON AFRICAN AMERICAN: 21 mL/min — AB (ref >60)
Glucose, Bld: 171 mg/dL — ABNORMAL HIGH (ref 65–99)
POTASSIUM: 5.8 mmol/L — AB (ref 3.5–5.1)
Sodium: 139 mmol/L (ref 135–145)

## 2015-12-20 LAB — CBC
HCT: 34.4 % — ABNORMAL LOW (ref 36.0–46.0)
Hemoglobin: 11.2 g/dL — ABNORMAL LOW (ref 12.0–15.0)
MCH: 27.5 pg (ref 26.0–34.0)
MCHC: 32.6 g/dL (ref 30.0–36.0)
MCV: 84.3 fL (ref 78.0–100.0)
PLATELETS: 231 10*3/uL (ref 150–400)
RBC: 4.08 MIL/uL (ref 3.87–5.11)
RDW: 18.5 % — AB (ref 11.5–15.5)
WBC: 4.7 10*3/uL (ref 4.0–10.5)

## 2015-12-20 LAB — I-STAT TROPONIN, ED: Troponin i, poc: 0.05 ng/mL (ref 0.00–0.08)

## 2015-12-20 LAB — I-STAT CHEM 8, ED
BUN: 33 mg/dL — ABNORMAL HIGH (ref 6–20)
Calcium, Ion: 1.28 mmol/L (ref 1.13–1.30)
Chloride: 106 mmol/L (ref 101–111)
Creatinine, Ser: 1.9 mg/dL — ABNORMAL HIGH (ref 0.44–1.00)
GLUCOSE: 156 mg/dL — AB (ref 65–99)
HCT: 41 % (ref 36.0–46.0)
HEMOGLOBIN: 13.9 g/dL (ref 12.0–15.0)
POTASSIUM: 4.2 mmol/L (ref 3.5–5.1)
SODIUM: 143 mmol/L (ref 135–145)
TCO2: 21 mmol/L (ref 0–100)

## 2015-12-20 LAB — BRAIN NATRIURETIC PEPTIDE: B NATRIURETIC PEPTIDE 5: 1957.6 pg/mL — AB (ref 0.0–100.0)

## 2015-12-20 MED ORDER — SODIUM CHLORIDE 0.9% FLUSH
3.0000 mL | Freq: Two times a day (BID) | INTRAVENOUS | Status: DC
  Administered 2015-12-21 – 2015-12-24 (×7): 3 mL via INTRAVENOUS

## 2015-12-20 MED ORDER — ENOXAPARIN SODIUM 40 MG/0.4ML ~~LOC~~ SOLN
40.0000 mg | SUBCUTANEOUS | Status: DC
Start: 2015-12-21 — End: 2015-12-21

## 2015-12-20 MED ORDER — FUROSEMIDE 10 MG/ML IJ SOLN
60.0000 mg | Freq: Two times a day (BID) | INTRAMUSCULAR | Status: DC
  Administered 2015-12-21 – 2015-12-23 (×6): 60 mg via INTRAVENOUS
  Filled 2015-12-20 (×6): qty 6

## 2015-12-20 MED ORDER — INSULIN ASPART 100 UNIT/ML ~~LOC~~ SOLN
0.0000 [IU] | Freq: Three times a day (TID) | SUBCUTANEOUS | Status: DC
  Administered 2015-12-21: 3 [IU] via SUBCUTANEOUS
  Administered 2015-12-21: 1 [IU] via SUBCUTANEOUS
  Administered 2015-12-22: 2 [IU] via SUBCUTANEOUS
  Administered 2015-12-22: 1 [IU] via SUBCUTANEOUS
  Administered 2015-12-23: 3 [IU] via SUBCUTANEOUS
  Administered 2015-12-23: 5 [IU] via SUBCUTANEOUS
  Administered 2015-12-23: 2 [IU] via SUBCUTANEOUS
  Administered 2015-12-24: 1 [IU] via SUBCUTANEOUS
  Administered 2015-12-24: 2 [IU] via SUBCUTANEOUS

## 2015-12-20 MED ORDER — ASPIRIN 81 MG PO CHEW
81.0000 mg | CHEWABLE_TABLET | Freq: Every day | ORAL | Status: DC
  Administered 2015-12-21 – 2015-12-24 (×4): 81 mg via ORAL
  Filled 2015-12-20 (×4): qty 1

## 2015-12-20 MED ORDER — ACETAMINOPHEN 500 MG PO TABS
500.0000 mg | ORAL_TABLET | Freq: Four times a day (QID) | ORAL | Status: DC | PRN
  Administered 2015-12-21: 500 mg via ORAL
  Filled 2015-12-20: qty 1

## 2015-12-20 MED ORDER — PANTOPRAZOLE SODIUM 40 MG PO TBEC
40.0000 mg | DELAYED_RELEASE_TABLET | Freq: Every day | ORAL | Status: DC
  Administered 2015-12-21 – 2015-12-24 (×4): 40 mg via ORAL
  Filled 2015-12-20 (×4): qty 1

## 2015-12-20 MED ORDER — MAGNESIUM OXIDE 400 (241.3 MG) MG PO TABS
400.0000 mg | ORAL_TABLET | Freq: Two times a day (BID) | ORAL | Status: DC
  Administered 2015-12-21 – 2015-12-24 (×8): 400 mg via ORAL
  Filled 2015-12-20 (×8): qty 1

## 2015-12-20 MED ORDER — LORATADINE 10 MG PO TABS
10.0000 mg | ORAL_TABLET | Freq: Every day | ORAL | Status: DC
  Administered 2015-12-21 – 2015-12-24 (×4): 10 mg via ORAL
  Filled 2015-12-20 (×4): qty 1

## 2015-12-20 MED ORDER — INSULIN GLARGINE 100 UNIT/ML ~~LOC~~ SOLN
5.0000 [IU] | Freq: Every day | SUBCUTANEOUS | Status: DC
  Administered 2015-12-21 – 2015-12-23 (×4): 5 [IU] via SUBCUTANEOUS
  Filled 2015-12-20 (×5): qty 0.05

## 2015-12-20 MED ORDER — ATORVASTATIN CALCIUM 40 MG PO TABS
40.0000 mg | ORAL_TABLET | Freq: Every day | ORAL | Status: DC
  Administered 2015-12-21 – 2015-12-23 (×3): 40 mg via ORAL
  Filled 2015-12-20 (×3): qty 1

## 2015-12-20 MED ORDER — AMLODIPINE BESYLATE 5 MG PO TABS
5.0000 mg | ORAL_TABLET | Freq: Every day | ORAL | Status: DC
  Administered 2015-12-21 – 2015-12-24 (×4): 5 mg via ORAL
  Filled 2015-12-20 (×4): qty 1

## 2015-12-20 MED ORDER — ONDANSETRON HCL 4 MG/2ML IJ SOLN: 4.0000 mg | Freq: Four times a day (QID) | INTRAMUSCULAR | Status: DC | PRN

## 2015-12-20 MED ORDER — ZOLPIDEM TARTRATE 5 MG PO TABS: 5.0000 mg | ORAL_TABLET | Freq: Every evening | ORAL | Status: DC | PRN

## 2015-12-20 MED ORDER — SODIUM CHLORIDE 0.9 % IV SOLN
250.0000 mL | INTRAVENOUS | Status: DC | PRN
Start: 2015-12-20 — End: 2015-12-24

## 2015-12-20 MED ORDER — SODIUM CHLORIDE 0.9% FLUSH: 3.0000 mL | INTRAVENOUS | Status: DC | PRN

## 2015-12-20 MED ORDER — ISOSORBIDE MONONITRATE ER 60 MG PO TB24
60.0000 mg | ORAL_TABLET | Freq: Every day | ORAL | Status: DC
  Administered 2015-12-21 – 2015-12-24 (×4): 60 mg via ORAL
  Filled 2015-12-20 (×4): qty 1

## 2015-12-20 MED ORDER — ALBUTEROL SULFATE (2.5 MG/3ML) 0.083% IN NEBU
2.5000 mg | INHALATION_SOLUTION | Freq: Four times a day (QID) | RESPIRATORY_TRACT | Status: DC | PRN
  Administered 2015-12-21: 2.5 mg via RESPIRATORY_TRACT
  Filled 2015-12-20: qty 3

## 2015-12-20 MED ORDER — POLYETHYLENE GLYCOL 3350 17 G PO PACK: 17.0000 g | PACK | Freq: Every day | ORAL | Status: DC | PRN

## 2015-12-20 MED ORDER — FUROSEMIDE 10 MG/ML IJ SOLN
60.0000 mg | Freq: Once | INTRAMUSCULAR | Status: AC
  Administered 2015-12-20: 60 mg via INTRAVENOUS
  Filled 2015-12-20: qty 6

## 2015-12-20 MED ORDER — METOPROLOL SUCCINATE ER 25 MG PO TB24
25.0000 mg | ORAL_TABLET | Freq: Every day | ORAL | Status: DC
  Filled 2015-12-20: qty 1

## 2015-12-20 MED ORDER — ALBUTEROL SULFATE HFA 108 (90 BASE) MCG/ACT IN AERS: 2.0000 | INHALATION_SPRAY | Freq: Four times a day (QID) | RESPIRATORY_TRACT | Status: DC | PRN

## 2015-12-20 MED ORDER — HYDRALAZINE HCL 25 MG PO TABS
12.5000 mg | ORAL_TABLET | Freq: Three times a day (TID) | ORAL | Status: DC
  Administered 2015-12-21 (×2): 12.5 mg via ORAL
  Filled 2015-12-20 (×2): qty 1

## 2015-12-20 NOTE — H&P (Signed)
[facility] History and Physical  [name] [id number] DOB: November 26, 1932 DOA: 12/20/2015  Referring physician: ED physician PCP: [name], MD  Specialists:   Chief Complaint: Shortness of breath and worsening leg edema  HPI: [name] is a 82 y.o. female with PMH of chronic combined systolic and diastolic congestive heart failure with an EF 30-35%, hyperlipidemia, diabetes mellitus, GERD, embolic stroke, GI bleeding, uterine cancer, CAD, MI, chronic kidney disease-stage IV, tricuspid valve regurgitation, PAF (not on anticoagulants possibly due to history of GI bleeding), who presents with a shortness present worsening leg edema.  Patient reports that she has been having shortness of breath and worsening bilateral leg edema for several days. She has intermittent mild chest pain, currently no chest pain. She has dry cough, but no fever or chills. No tenderness over calf areas. She reports having mild burning on urination, and suprapubic abdominal pain, but no dysuria. No nausea, vomiting, diarrhea. No unilateral weakness.  In ED, patient was found to have troponin 1957.6, negative troponin, WBC 4.7, temperature 90.9, bradycardia, stable renal function, chest x-rays consistent with congestive heart failure. Patient is admitted to inpatient for further eval and treatment.  EKG: Independently reviewed.  QTC 527, bradycardia, widened QRS wave, poor R-wave progression, which is similar with previous EKG on 11/04/15.  Where does patient live?   At home    Can patient participate in ADLs?   Barely  Review of Systems:   General: no fevers, chills, no changes in body weight, has poor appetite, has fatigue HEENT: no blurry vision, hearing changes or sore throat Pulm: has dyspnea, coughing, no wheezing CV: had chest pain, no palpitations Abd: no nausea, vomiting, abdominal pain, diarrhea, constipation GU: no dysuria, has burning on urination, no increased urinary frequency,  hematuria  Ext: has leg edema Neuro: no unilateral weakness, numbness, or tingling, no vision change or hearing loss Skin: no rash MSK: No muscle spasm, no deformity, no limitation of range of movement in spin Heme: No easy bruising.  Travel history: No recent long distant travel.  Allergy:  Allergies  Allergen Reactions  . Baking Soda-Fluoride [Sodium Fluoride] Nausea And Vomiting  . Magnesium Hydroxide Nausea And Vomiting    Past Medical History  Diagnosis Date  . Asthma   . CAD (coronary artery disease)     Pt reports MI in 2006 (no documentation).  Cardiolite in 05/2002 and 07/2006 did not reveal any reversible ischemia.  Pt follows with Dr. Rex Kras at [facility].  . CHF (congestive heart failure) (HCC)     EF 25-30% with dilated LV, mild LVH, severe hypokinesis, and mod-severe reduction in RV function  . Osteoporosis   . HYPERTENSION 08/03/2006  . GASTROPARESIS, DIABETIC 08/03/2006  . HYPERLIPIDEMIA 08/03/2006  . OBSTRUCTIVE SLEEP APNEA 01/06/2008  . PERIPHERAL NEUROPATHY 08/03/2006  . GERD 08/03/2006  . LOW BACK PAIN, CHRONIC 08/03/2006  . OSTEOPOROSIS 03/21/2009  . CEREBRAL EMBOLISM, WITH INFARCTION 07/02/2010  . Angina   . Myocardial infarction ([place]) "2 or 3"  . Pneumonia 02/26/12    "a few times; probably even today"  . DIABETES MELLITUS, TYPE II 11/04/1983  . Blood transfusion     multiple in 2012, 2015.   . GI bleed 08/2011 and 01/2014  . Chronic daily headache     migraines as well.   . Stroke [facility]) summer 2011    "made my left hip worse"  . Uterine cancer ([place])   . Nonischemic cardiomyopathy ([place]) 05/2010    Left heart catheterization:2011. Nonobstructive  coronary artery disease.  . Pulmonary hypertension ([place])   . NSTEMI (non-ST elevated myocardial infarction) ([place]) 01/2014    type II with CVA  . Acute embolic stroke ([place]) 0000000  . Non-ST elevation MI (NSTEMI) ([place]) 02/04/2014  . E. coli UTI 03/02/2014  . Renal failure, acute ([place]) 02/04/2014  . Iron deficiency  anemia secondary to blood loss (chronic) 01/06/2008    Qualifier: Diagnosis of  By: Tomasa Hosteller MD, Veronique D.   . Nonsustained ventricular tachycardia ([place])   . Angiodysplasia of intestine with hemorrhage 05/13/2014  . CKD (chronic kidney disease) stage 4, GFR 15-29 ml/min (HCC) 08/21/2014    Past Surgical History  Procedure Laterality Date  . Esophagogastroduodenoscopy  08/26/2011    Procedure: ESOPHAGOGASTRODUODENOSCOPY (EGD);  Surgeon: [name], MD;  Location: [facility] ENDOSCOPY;  Service: Endoscopy;  Laterality: N/A;  . Colonoscopy  08/28/2011    Procedure: COLONOSCOPY;  Surgeon: [name], MD;  Location: [place];  Service: Endoscopy;  Laterality: N/A;  . Vaginal hysterectomy    . Tubal ligation    . Cataract extraction w/ intraocular lens  implant, bilateral    . Toe surgery      "right big toe; operated on it to straighten it out; it was under"  . Polysomnogram  10/17/2005    AHI-7.28/hr. AHI REM-20.8/hr. Average oxygen saturation range during REM and NREM was 97%. Lowest oxygen saturation during REM sleep was 90%.  . Carotid duplex  05/28/2010    No significant extracranial carotid artery stenosis demonstrated. Vertebrals are patent w/ antegrade flow.  . Cardiac catheterization  05/24/2010    No intervention - recommed medical therapy.  . Cardiovascular stress test  08/07/2006    Moderate-severe defect seen in Basal inferior, Mid inferoseptal, Mid inferior, Mid inferolateral, and Apical inferior regions - consistent w/ infarct/scar. No scintigraphic evidence of inducible myocardial ischemia.  . Transthoracic echocardiogram  08/29/2011    EF 55-60%, moderate LVH,   . Esophagogastroduodenoscopy N/A 02/08/2014    Procedure: ESOPHAGOGASTRODUODENOSCOPY (EGD);  Surgeon: [name], MD;  Location: [facility] ENDOSCOPY;  Service: Endoscopy;  Laterality: N/A;  . Colonoscopy N/A 05/12/2014    Procedure: COLONOSCOPY;  Surgeon: [name], MD;  Location: [place];  Service: Endoscopy;   Laterality: N/A;  . Enteroscopy N/A 05/13/2014    Procedure: ENTEROSCOPY;  Surgeon: [name], MD;  Location: [place];  Service: Endoscopy;  Laterality: N/A;  . Enteroscopy N/A 07/13/2014    Procedure: small bowel enteroscopy;  Surgeon: Jerene Bears, MD;  Location: [facility] ENDOSCOPY;  Service: Endoscopy;  Laterality: N/A;  requests slim colonoscope    Social History:  reports that she has never smoked. She quit smokeless tobacco use about 9 years ago. Her smokeless tobacco use included Snuff. She reports that she does not drink alcohol or use illicit drugs.  Family History:  Family History  Problem Relation Age of Onset  . Diabetes insipidus Mother   . Hypertension Mother   . Hypertension Father   . Hypertension Sister   . Hypertension Child   . Stomach cancer Brother      Prior to Admission medications   Medication Sig Start Date End Date Taking? Authorizing Provider  acetaminophen (TYLENOL) 500 MG tablet Take 500 mg by mouth every 6 (six) hours as needed for moderate pain or headache.   Yes Historical Provider, MD  albuterol (PROAIR HFA) 108 (90 BASE) MCG/ACT inhaler Inhale 2 puffs into the lungs every 6 (six) hours as needed for wheezing. 05/25/14  Yes  Lavon Paganini Angiulli, PA-C  amLODipine (NORVASC) 5 MG tablet TAKE 1 TABLET EVERY DAY 09/17/15  Yes Lorretta Harp, MD  aspirin 81 MG tablet Take 81 mg by mouth daily.   Yes Historical Provider, MD  atorvastatin (LIPITOR) 40 MG tablet TAKE 1 TABLET (40 MG TOTAL) BY MOUTH DAILY AT 6 PM. 11/12/15  Yes Lorretta Harp, MD  hydrALAZINE (APRESOLINE) 25 MG tablet Take 0.5 tablets (12.5 mg total) by mouth every 8 (eight) hours. 02/19/15  Yes Isaiah Serge, NP  insulin glargine (LANTUS) 100 UNIT/ML injection Inject 0.07 mLs (7 Units total) into the skin at bedtime. 11/07/15  Yes Geradine Girt, DO  isosorbide mononitrate (IMDUR) 60 MG 24 hr tablet Take 1 tablet (60 mg total) by mouth daily. 05/25/14  Yes Daniel J Angiulli, PA-C  KLOR-CON 10 10 MEQ  tablet Take 20 mEq by mouth daily.  10/09/14  Yes Historical Provider, MD  loratadine (ALLERGY RELIEF) 10 MG tablet Take 10 mg by mouth daily. Reported on 12/07/2015   Yes Historical Provider, MD  magnesium oxide (MAG-OX) 400 (241.3 MG) MG tablet Take 1 tablet (400 mg total) by mouth 2 (two) times daily. 05/25/14  Yes Daniel J Angiulli, PA-C  metoprolol succinate (TOPROL-XL) 25 MG 24 hr tablet TAKE 1 TABLET (25 MG TOTAL) BY MOUTH DAILY. 08/15/15  Yes Lorretta Harp, MD  omeprazole (PRILOSEC) 20 MG capsule Take 1 capsule (20 mg total) by mouth daily before breakfast. 05/25/14  Yes Lavon Paganini Angiulli, PA-C  polyethylene glycol (MIRALAX / GLYCOLAX) packet Take 17 g by mouth daily as needed for mild constipation.    Yes Historical Provider, MD  torsemide (DEMADEX) 20 MG tablet 20 mg alternating with 40 mg daily 11/07/15  Yes Geradine Girt, DO    Physical Exam: Filed Vitals:   12/20/15 2200 12/20/15 2215 12/20/15 2245 12/21/15 0017  BP: 158/70 152/74 151/74 157/73  Pulse: 59 60 57 59  Temp:    98 F (36.7 C)  TempSrc:    Oral  Resp: 19 19 16 20   Weight:    72.439 kg (159 lb 11.2 oz)  SpO2: 94% 96% 99% 97%   General: Not in acute distress HEENT:       Eyes: PERRL, EOMI, no scleral icterus.       ENT: No discharge from the ears and nose, no pharynx injection, no tonsillar enlargement.        Neck: Positive JVD, no bruit, no mass felt. Heme: No neck lymph node enlargement. Cardiac: S1/S2, RRR, No murmurs, No gallops or rubs. Pulm: No rales, wheezing, rhonchi or rubs. Abd: Soft, nondistended, mild tenderness over suprapubic area, no rebound pain, no organomegaly, BS present. Ext: 3+ pitting leg edema bilaterally. 2+DP/PT pulse bilaterally. Musculoskeletal: No joint deformities, No joint redness or warmth, no limitation of ROM in spin. Skin: No rashes.  Neuro: Alert, oriented X3, cranial nerves II-XII grossly intact, moves all extremities normally. Psych: Patient is not psychotic, no suicidal or  hemocidal ideation.  Labs on Admission:  Basic Metabolic Panel:  Recent Labs Lab 12/20/15 1701 12/20/15 1948  NA 139 143  K 5.8* 4.2  CL 108 106  CO2 21*  --   GLUCOSE 171* 156*  BUN 32* 33*  CREATININE 2.10* 1.90*  CALCIUM 9.3  --    Liver Function Tests: No results for input(s): AST, ALT, ALKPHOS, BILITOT, PROT, ALBUMIN in the last 168 hours.  Recent Labs Lab 12/21/15 0011  LIPASE 22   No results for  input(s): AMMONIA in the last 168 hours. CBC:  Recent Labs Lab 12/20/15 1701 12/20/15 1948  WBC 4.7  --   HGB 11.2* 13.9  HCT 34.4* 41.0  MCV 84.3  --   PLT 231  --    Cardiac Enzymes:  Recent Labs Lab 12/21/15 0011  TROPONINI 0.04*    BNP (last 3 results)  Recent Labs  04/04/15 1100 11/04/15 1350 12/20/15 1736  BNP 1383.6* 1841.7* 1957.6*    ProBNP (last 3 results) No results for input(s): PROBNP in the last 8760 hours.  CBG: No results for input(s): GLUCAP in the last 168 hours.  Radiological Exams on Admission: Dg Chest 2 View  12/20/2015  CLINICAL DATA:  For 3 years patient has been severely SOB. Patient states she can not remember a lot. HX CHF, CAD, HTN, Diabetes, myocardial infarction, pneumonia, stroke 2011, cardiac catheterization EXAM: CHEST  2 VIEW COMPARISON:  11/04/2015 FINDINGS: Heart size is markedly enlarged but stable in appearance. Interstitial edema again noted. Small right pleural effusion. There are no focal consolidations. Visualized osseous structures have a normal appearance. IMPRESSION: Stable marked enlargement of the heart/pericardial silhouette. CHF/interstitial edema and small right effusion. Electronically Signed   By: Nolon Nations M.D.   On: 12/20/2015 17:55    Assessment/Plan Principal Problem:   Acute on chronic combined systolic and diastolic heart failure (HCC) Active Problems:   DM (diabetes mellitus), type 2, uncontrolled (HCC)   Hyperlipidemia   Iron deficiency anemia secondary to blood loss (chronic)    Essential hypertension   GERD   CAD (coronary artery disease)   CVA (cerebral infarction)   CKD (chronic kidney disease) stage 4, GFR 15-29 ml/min (HCC)   Paroxysmal atrial fibrillation (HCC)   Severe tricuspid regurgitation   CHF exacerbation (HCC)   Suprapubic abdominal pain   Acute on chronic combined systolic and diastolic heart failure ([place]): Patient's shortness of breath, worsening leg edema, positive JVD, elevated BNP and CXR findings are consistent with CHF exacerbation.  -will admit to tele bed -Lasix 60 mg bid by IV -trop x 3 -2d echo -will continue home metoprolol, ASA -Daily weights -strict I/O's -Low salt diet  DM-II: Last A1c 9.8 on 11/05/15, poorly controled. Patient is taking Lantus at home -will decrease Lantus dose from 7 units to 5 units daily  -SSI  HLD: Last LDL was 43 on 11/05/15 -Continue home medications: Lipitor  GERD: -Protonix  Hx of stroke -continue ASA and lipitor  CKD-IV: stable . Baseline creatinine level is 1.7-2.0. Her creatinine is 1.9, which is at baseline. -Follow-up renal function by BMP  Suprapubic abdominal pain: Etiology is not clear. Patient has burning on urination, indicating possible UTI. -Check urinalysis and urine culture -Check lipase  DVT ppx: SQ Lovenox  Code Status: Full code Family Communication:  Yes, patient's daughter at bed side Disposition Plan: Admit to inpatient   Date of Service 12/21/2015    Ivor Costa [facility] Pager [phone]  If 7PM-7AM, please contact night-coverage [email or web address] Password [email or web address] 12/21/2015, 2:56 AM

## 2015-12-20 NOTE — ED Provider Notes (Signed)
CSN: [id number]     Arrival date & time 12/20/15  1646 History   First MD Initiated Contact with Patient 12/20/15 1715     Chief Complaint  Patient presents with  . Shortness of Breath     (Consider location/radiation/quality/duration/timing/severity/associated sxs/prior Treatment) Patient is a 82 y.o. female presenting with shortness of breath. The history is provided by the patient and a relative.  Shortness of Breath Severity:  Moderate Onset quality:  Gradual Duration:  2 days Timing:  Constant Progression:  Worsening Chronicity:  Recurrent Context comment:  Patient with hx of CHF, denies any recent medication or diet changes, reports sob, leg swelling, weight gain, and cough productive of clear sputum for two days Relieved by:  Nothing Exacerbated by: laying flat. Associated symptoms: cough and sputum production (clear)   Associated symptoms: no abdominal pain, no chest pain, no diaphoresis, no fever, no headaches, no rash, no sore throat, no syncope, no vomiting and no wheezing     Past Medical History  Diagnosis Date  . Asthma   . CAD (coronary artery disease)     Pt reports MI in 2006 (no documentation).  Cardiolite in 05/2002 and 07/2006 did not reveal any reversible ischemia.  Pt follows with Dr. Rex Kras at [facility].  . CHF (congestive heart failure) (HCC)     EF 25-30% with dilated LV, mild LVH, severe hypokinesis, and mod-severe reduction in RV function  . Osteoporosis   . HYPERTENSION 08/03/2006  . GASTROPARESIS, DIABETIC 08/03/2006  . HYPERLIPIDEMIA 08/03/2006  . OBSTRUCTIVE SLEEP APNEA 01/06/2008  . PERIPHERAL NEUROPATHY 08/03/2006  . GERD 08/03/2006  . LOW BACK PAIN, CHRONIC 08/03/2006  . OSTEOPOROSIS 03/21/2009  . CEREBRAL EMBOLISM, WITH INFARCTION 07/02/2010  . Angina   . Myocardial infarction ([place]) "2 or 3"  . Pneumonia 02/26/12    "a few times; probably even today"  . DIABETES MELLITUS, TYPE II 11/04/1983  . Blood transfusion     multiple in 2012, 2015.   . GI  bleed 08/2011 and 01/2014  . Chronic daily headache     migraines as well.   . Stroke [facility]) summer 2011    "made my left hip worse"  . Uterine cancer ([place])   . Nonischemic cardiomyopathy ([place]) 05/2010    Left heart catheterization:2011. Nonobstructive coronary artery disease.  . Pulmonary hypertension ([place])   . NSTEMI (non-ST elevated myocardial infarction) ([place]) 01/2014    type II with CVA  . Acute embolic stroke ([place]) 0000000  . Non-ST elevation MI (NSTEMI) ([place]) 02/04/2014  . E. coli UTI 03/02/2014  . Renal failure, acute ([place]) 02/04/2014  . Iron deficiency anemia secondary to blood loss (chronic) 01/06/2008    Qualifier: Diagnosis of  By: Tomasa Hosteller MD, Veronique D.   . Nonsustained ventricular tachycardia ([place])   . Angiodysplasia of intestine with hemorrhage 05/13/2014  . CKD (chronic kidney disease) stage 4, GFR 15-29 ml/min (HCC) 08/21/2014   Past Surgical History  Procedure Laterality Date  . Esophagogastroduodenoscopy  08/26/2011    Procedure: ESOPHAGOGASTRODUODENOSCOPY (EGD);  Surgeon: [name], MD;  Location[facility] ENDOSCOPY;  Service: Endoscopy;  Laterality: N/A;  . Colonoscopy  08/28/2011    Procedure: COLONOSCOPY;  Surgeon: [name], MD;  Location: [place];  Service: Endoscopy;  Laterality: N/A;  . Vaginal hysterectomy    . Tubal ligation    . Cataract extraction w/ intraocular lens  implant, bilateral    . Toe surgery      "right big toe; operated on it to straighten it  out; it was under"  . Polysomnogram  10/17/2005    AHI-7.28/hr. AHI REM-20.8/hr. Average oxygen saturation range during REM and NREM was 97%. Lowest oxygen saturation during REM sleep was 90%.  . Carotid duplex  05/28/2010    No significant extracranial carotid artery stenosis demonstrated. Vertebrals are patent w/ antegrade flow.  . Cardiac catheterization  05/24/2010    No intervention - recommed medical therapy.  . Cardiovascular stress test  08/07/2006    Moderate-severe defect seen in Basal  inferior, Mid inferoseptal, Mid inferior, Mid inferolateral, and Apical inferior regions - consistent w/ infarct/scar. No scintigraphic evidence of inducible myocardial ischemia.  . Transthoracic echocardiogram  08/29/2011    EF 55-60%, moderate LVH,   . Esophagogastroduodenoscopy N/A 02/08/2014    Procedure: ESOPHAGOGASTRODUODENOSCOPY (EGD);  Surgeon: [name], MD;  Location: [facility] ENDOSCOPY;  Service: Endoscopy;  Laterality: N/A;  . Colonoscopy N/A 05/12/2014    Procedure: COLONOSCOPY;  Surgeon: [name], MD;  Location: [place];  Service: Endoscopy;  Laterality: N/A;  . Enteroscopy N/A 05/13/2014    Procedure: ENTEROSCOPY;  Surgeon: [name], MD;  Location: [place];  Service: Endoscopy;  Laterality: N/A;  . Enteroscopy N/A 07/13/2014    Procedure: small bowel enteroscopy;  Surgeon: Jerene Bears, MD;  Location: [facility] ENDOSCOPY;  Service: Endoscopy;  Laterality: N/A;  requests slim colonoscope   Family History  Problem Relation Age of Onset  . Diabetes insipidus Mother   . Hypertension Mother   . Hypertension Father   . Hypertension Sister   . Hypertension Child   . Stomach cancer Brother    Social History  Substance Use Topics  . Smoking status: Never Smoker   . Smokeless tobacco: Former Systems developer    Types: Snuff    Quit date: 10/13/2006     Comment: 02/26/12 "stopped snuff 4-6 years ago"  . Alcohol Use: No     Comment: "stopped drinking alcohol ~ 1980's"   OB History    No data available     Review of Systems  Constitutional: Negative for fever, chills, diaphoresis, activity change, appetite change and fatigue.  HENT: Negative for facial swelling, rhinorrhea, sore throat, trouble swallowing and voice change.   Eyes: Negative for photophobia, pain and visual disturbance.  Respiratory: Positive for cough, sputum production (clear) and shortness of breath. Negative for wheezing and stridor.   Cardiovascular: Positive for leg swelling. Negative for chest pain,  palpitations and syncope.  Gastrointestinal: Negative for nausea, vomiting, abdominal pain, constipation and anal bleeding.  Endocrine: Negative.   Genitourinary: Negative for dysuria, vaginal bleeding, vaginal discharge and vaginal pain.  Musculoskeletal: Negative for myalgias, back pain and arthralgias.  Skin: Negative.  Negative for rash.  Allergic/Immunologic: Negative.   Neurological: Negative for dizziness, tremors, syncope, weakness and headaches.  Psychiatric/Behavioral: Negative for suicidal ideas, sleep disturbance and self-injury.  All other systems reviewed and are negative.     Allergies  Baking soda-fluoride and Magnesium hydroxide  Home Medications   Prior to Admission medications   Medication Sig Start Date End Date Taking? Authorizing Provider  acetaminophen (TYLENOL) 500 MG tablet Take 500 mg by mouth every 6 (six) hours as needed for moderate pain or headache.   Yes Historical Provider, MD  albuterol (PROAIR HFA) 108 (90 BASE) MCG/ACT inhaler Inhale 2 puffs into the lungs every 6 (six) hours as needed for wheezing. 05/25/14  Yes Daniel J Angiulli, PA-C  amLODipine (NORVASC) 5 MG tablet TAKE 1 TABLET EVERY DAY 09/17/15  Yes Pearletha Forge  Gwenlyn Found, MD  aspirin 81 MG tablet Take 81 mg by mouth daily.   Yes Historical Provider, MD  atorvastatin (LIPITOR) 40 MG tablet TAKE 1 TABLET (40 MG TOTAL) BY MOUTH DAILY AT 6 PM. 11/12/15  Yes Lorretta Harp, MD  hydrALAZINE (APRESOLINE) 25 MG tablet Take 0.5 tablets (12.5 mg total) by mouth every 8 (eight) hours. 02/19/15  Yes Isaiah Serge, NP  insulin glargine (LANTUS) 100 UNIT/ML injection Inject 0.07 mLs (7 Units total) into the skin at bedtime. 11/07/15  Yes Geradine Girt, DO  isosorbide mononitrate (IMDUR) 60 MG 24 hr tablet Take 1 tablet (60 mg total) by mouth daily. 05/25/14  Yes Daniel J Angiulli, PA-C  KLOR-CON 10 10 MEQ tablet Take 20 mEq by mouth daily.  10/09/14  Yes Historical Provider, MD  loratadine (ALLERGY RELIEF) 10 MG  tablet Take 10 mg by mouth daily. Reported on 12/07/2015   Yes Historical Provider, MD  magnesium oxide (MAG-OX) 400 (241.3 MG) MG tablet Take 1 tablet (400 mg total) by mouth 2 (two) times daily. 05/25/14  Yes Daniel J Angiulli, PA-C  metoprolol succinate (TOPROL-XL) 25 MG 24 hr tablet TAKE 1 TABLET (25 MG TOTAL) BY MOUTH DAILY. 08/15/15  Yes Lorretta Harp, MD  omeprazole (PRILOSEC) 20 MG capsule Take 1 capsule (20 mg total) by mouth daily before breakfast. 05/25/14  Yes Lavon Paganini Angiulli, PA-C  polyethylene glycol (MIRALAX / GLYCOLAX) packet Take 17 g by mouth daily as needed for mild constipation.    Yes Historical Provider, MD  torsemide (DEMADEX) 20 MG tablet 20 mg alternating with 40 mg daily 11/07/15  Yes Jessica U Vann, DO   BP 151/74 mmHg  Pulse 57  Temp(Src) 99 F (37.2 C) (Oral)  Resp 16  SpO2 99%  LMP 12/19/1968 Physical Exam  Constitutional: She is oriented to person, place, and time. She appears well-developed and well-nourished. No distress.  Hard of hearing elderly african american female  HENT:  Head: Normocephalic and atraumatic.  Right Ear: External ear normal.  Left Ear: External ear normal.  Mouth/Throat: Oropharynx is clear and moist. No oropharyngeal exudate.  Eyes: Conjunctivae and EOM are normal. Pupils are equal, round, and reactive to light. No scleral icterus.  Neck: Normal range of motion. Neck supple. No JVD present. No tracheal deviation present. No thyromegaly present.  Cardiovascular: Normal rate, regular rhythm and intact distal pulses.   Pulmonary/Chest: No respiratory distress.  Bi basilar crackles. Scant expiratory wheezes. Tachypnea to mid 20's but no accessory muscle usage or frank respiratory distress  Abdominal: Soft. Bowel sounds are normal. She exhibits no distension. There is no tenderness.  Musculoskeletal: Normal range of motion. She exhibits edema (3+ pitting edema bilaterally). She exhibits no tenderness.  Neurological: She is alert and  oriented to person, place, and time. No cranial nerve deficit. She exhibits normal muscle tone. Coordination normal.  Skin: Skin is warm and dry. She is not diaphoretic. No pallor.  Psychiatric: She has a normal mood and affect. She expresses no homicidal and no suicidal ideation. She expresses no suicidal plans and no homicidal plans.  Nursing note and vitals reviewed.   ED Course  Procedures (including critical care time) Labs Review Labs Reviewed  BASIC METABOLIC PANEL - Abnormal; Notable for the following:    Potassium 5.8 (*)    CO2 21 (*)    Glucose, Bld 171 (*)    BUN 32 (*)    Creatinine, Ser 2.10 (*)    GFR calc non Af Wyvonnia Lora  21 (*)    GFR calc Af Amer 24 (*)    All other components within normal limits  CBC - Abnormal; Notable for the following:    Hemoglobin 11.2 (*)    HCT 34.4 (*)    RDW 18.5 (*)    All other components within normal limits  BRAIN NATRIURETIC PEPTIDE - Abnormal; Notable for the following:    B Natriuretic Peptide 1957.6 (*)    All other components within normal limits  I-STAT CHEM 8, ED - Abnormal; Notable for the following:    BUN 33 (*)    Creatinine, Ser 1.90 (*)    Glucose, Bld 156 (*)    All other components within normal limits  I-STAT ARTERIAL BLOOD GAS, ED - Abnormal; Notable for the following:    pO2, Arterial 70.0 (*)    All other components within normal limits  BLOOD GAS, ARTERIAL  I-STAT TROPOININ, ED    Imaging Review Dg Chest 2 View  12/20/2015  CLINICAL DATA:  For 3 years patient has been severely SOB. Patient states she can not remember a lot. HX CHF, CAD, HTN, Diabetes, myocardial infarction, pneumonia, stroke 2011, cardiac catheterization EXAM: CHEST  2 VIEW COMPARISON:  11/04/2015 FINDINGS: Heart size is markedly enlarged but stable in appearance. Interstitial edema again noted. Small right pleural effusion. There are no focal consolidations. Visualized osseous structures have a normal appearance. IMPRESSION: Stable marked  enlargement of the heart/pericardial silhouette. CHF/interstitial edema and small right effusion. Electronically Signed   By: Nolon Nations M.D.   On: 12/20/2015 17:55   I have personally reviewed and evaluated these images and lab results as part of my medical decision-making.   EKG Interpretation   Date/Time:  Thursday December 20 2015 16:57:02 EST Ventricular Rate:  63 PR Interval:  195 QRS Duration: 159 QT Interval:  515 QTC Calculation: 527 R Axis:   -132 Text Interpretation:  Sinus rhythm Nonspecific intraventricular conduction  delay Probable inferior infarct, age indeterminate Consider anterior  infarct Lateral leads are also involved Baseline wander in lead(s) II V3  No significant change was found Confirmed by Wyvonnia Dusky  MD, STEPHEN [phone])  on 12/20/2015 5:07:02 PM      MDM   Final diagnoses:  Acute on chronic systolic congestive heart failure [facility])    The patient is 82 year old female with a history of CHF with ejection fraction of 30% who presents with 2 days of worsening shortness of breath and leg swelling and weight gain. Patient denies any recent medicines or diet changes. Patient is afebrile and hemodynamically stable. She is 20s. 3+ pitting edema on exam. Chest x-ray shows right sided pleural effusion with worsening pulmonary edema. He elevated to 1900. Patient remained stable Throughout her stay in the ED and symptomatically short of breath. She is given 60 of IV Lasix however remains symptomatic. Patient is admitted for further diuresis and symptomatic management.  Patient seen with attending, Dr. Wyvonnia Dusky, who oversaw clinical decision making.     Margaretann Loveless, MD 12/20/15 [id number]  Ezequiel Essex, MD 12/21/15 [phone]

## 2015-12-20 NOTE — ED Notes (Signed)
Attempted report to 2W °

## 2015-12-20 NOTE — ED Notes (Signed)
Pt to ER via private vehicle with family due to worsening shortness of breath since last night. Pt has hx of CHF. Bilateral lower extremity +3 pitting edema noted. Daughter reports increase in weight. Pt respirations labored on arrival to room. Alert to self and place, disoriented to time. O2 sats 100% on RA.

## 2015-12-20 NOTE — ED Notes (Addendum)
Called for triage 4X with no respose

## 2015-12-20 NOTE — ED Notes (Signed)
Ambulated patient in hallway checking pulse oximetry. During ambulation patient oxygen levels were 97% on room air and maintained throughout, dropping down to as low was 94% without any shortness of breath or distress noted and pulse rate was 60 and was unchanged.

## 2015-12-21 ENCOUNTER — Inpatient Hospital Stay (HOSPITAL_COMMUNITY): Payer: Commercial Managed Care - HMO

## 2015-12-21 DIAGNOSIS — E785 Hyperlipidemia, unspecified: Secondary | ICD-10-CM

## 2015-12-21 DIAGNOSIS — I10 Essential (primary) hypertension: Secondary | ICD-10-CM

## 2015-12-21 DIAGNOSIS — E1165 Type 2 diabetes mellitus with hyperglycemia: Secondary | ICD-10-CM

## 2015-12-21 DIAGNOSIS — E118 Type 2 diabetes mellitus with unspecified complications: Secondary | ICD-10-CM

## 2015-12-21 DIAGNOSIS — I071 Rheumatic tricuspid insufficiency: Secondary | ICD-10-CM

## 2015-12-21 DIAGNOSIS — I5043 Acute on chronic combined systolic (congestive) and diastolic (congestive) heart failure: Secondary | ICD-10-CM

## 2015-12-21 DIAGNOSIS — I480 Paroxysmal atrial fibrillation: Secondary | ICD-10-CM

## 2015-12-21 DIAGNOSIS — I509 Heart failure, unspecified: Secondary | ICD-10-CM

## 2015-12-21 DIAGNOSIS — Z794 Long term (current) use of insulin: Secondary | ICD-10-CM

## 2015-12-21 DIAGNOSIS — I2510 Atherosclerotic heart disease of native coronary artery without angina pectoris: Secondary | ICD-10-CM

## 2015-12-21 LAB — URINALYSIS, ROUTINE W REFLEX MICROSCOPIC
Bilirubin Urine: NEGATIVE
Glucose, UA: NEGATIVE mg/dL
Hgb urine dipstick: NEGATIVE
Ketones, ur: NEGATIVE mg/dL
LEUKOCYTES UA: NEGATIVE
NITRITE: NEGATIVE
PH: 6 (ref 5.0–8.0)
Protein, ur: NEGATIVE mg/dL
SPECIFIC GRAVITY, URINE: 1.008 (ref 1.005–1.030)

## 2015-12-21 LAB — BASIC METABOLIC PANEL WITH GFR
Anion gap: 10 (ref 5–15)
BUN: 31 mg/dL — ABNORMAL HIGH (ref 6–20)
CO2: 21 mmol/L — ABNORMAL LOW (ref 22–32)
Calcium: 9.4 mg/dL (ref 8.9–10.3)
Chloride: 108 mmol/L (ref 101–111)
Creatinine, Ser: 2.02 mg/dL — ABNORMAL HIGH (ref 0.44–1.00)
GFR calc Af Amer: 26 mL/min — ABNORMAL LOW
GFR calc non Af Amer: 22 mL/min — ABNORMAL LOW
Glucose, Bld: 180 mg/dL — ABNORMAL HIGH (ref 65–99)
Potassium: 5.9 mmol/L — ABNORMAL HIGH (ref 3.5–5.1)
Sodium: 139 mmol/L (ref 135–145)

## 2015-12-21 LAB — BASIC METABOLIC PANEL
Anion gap: 9 (ref 5–15)
BUN: 31 mg/dL — AB (ref 6–20)
CALCIUM: 9.4 mg/dL (ref 8.9–10.3)
CO2: 22 mmol/L (ref 22–32)
CREATININE: 1.93 mg/dL — AB (ref 0.44–1.00)
Chloride: 109 mmol/L (ref 101–111)
GFR calc Af Amer: 27 mL/min — ABNORMAL LOW (ref >60)
GFR, EST NON AFRICAN AMERICAN: 23 mL/min — AB (ref >60)
GLUCOSE: 147 mg/dL — AB (ref 65–99)
Potassium: 4.2 mmol/L (ref 3.5–5.1)
Sodium: 140 mmol/L (ref 135–145)

## 2015-12-21 LAB — PROTIME-INR
INR: 1.34 (ref 0.00–1.49)
Prothrombin Time: 16.7 s — ABNORMAL HIGH (ref 11.6–15.2)

## 2015-12-21 LAB — TROPONIN I
TROPONIN I: 0.04 ng/mL — AB (ref <0.031)
Troponin I: 0.04 ng/mL — ABNORMAL HIGH
Troponin I: 0.14 ng/mL — ABNORMAL HIGH

## 2015-12-21 LAB — GLUCOSE, CAPILLARY
GLUCOSE-CAPILLARY: 114 mg/dL — AB (ref 65–99)
Glucose-Capillary: 128 mg/dL — ABNORMAL HIGH (ref 65–99)
Glucose-Capillary: 236 mg/dL — ABNORMAL HIGH (ref 65–99)

## 2015-12-21 LAB — ECHOCARDIOGRAM COMPLETE: Weight: 2555.2 oz

## 2015-12-21 LAB — LIPASE, BLOOD: Lipase: 22 U/L (ref 11–51)

## 2015-12-21 LAB — APTT: aPTT: 31 seconds (ref 24–37)

## 2015-12-21 MED ORDER — HYDRALAZINE HCL 25 MG PO TABS
25.0000 mg | ORAL_TABLET | Freq: Three times a day (TID) | ORAL | Status: DC
  Administered 2015-12-21 – 2015-12-24 (×10): 25 mg via ORAL
  Filled 2015-12-21 (×10): qty 1

## 2015-12-21 MED ORDER — CARVEDILOL 6.25 MG PO TABS
6.2500 mg | ORAL_TABLET | Freq: Two times a day (BID) | ORAL | Status: DC
  Administered 2015-12-21 – 2015-12-24 (×6): 6.25 mg via ORAL
  Filled 2015-12-21 (×6): qty 1

## 2015-12-21 MED ORDER — ENOXAPARIN SODIUM 30 MG/0.3ML ~~LOC~~ SOLN
30.0000 mg | SUBCUTANEOUS | Status: DC
  Administered 2015-12-21 – 2015-12-24 (×4): 30 mg via SUBCUTANEOUS
  Filled 2015-12-21 (×4): qty 0.3

## 2015-12-21 NOTE — Progress Notes (Signed)
Patient moaning a lot in her sleep. States that she has stomach pain at night and that she doesn't take anything for it. Will continue to monitor and make the am shift aware to alert the physician. Call light within reach

## 2015-12-21 NOTE — Progress Notes (Signed)
Patient received from ED and oriented to room. Daughter was at bedside. Patient having an extremely time ambulating from bed to beside commode, and extremely short of breath. Also in a lot of pain from her hip. Due to aggressive IV diureses order was requested and received for a Foley catheter. Patient tolerated well, call light within reach.

## 2015-12-21 NOTE — Progress Notes (Signed)
*  PRELIMINARY RESULTS* Echocardiogram 2D Echocardiogram has been performed.  Darlina Sicilian M 12/21/2015, 1:47 PM

## 2015-12-21 NOTE — Progress Notes (Signed)
PATIENT DETAILS Name: [name] Age: 82 y.o. Sex: female Date of Birth: 1933-08-26 Admit Date: 12/20/2015 Admitting Physician Ivor Costa, MD [id number] Elyse Hsu, MD  Subjective: Feels better than yesterday.  Assessment/Plan: Principal Problem: Acute on chronic combined systolic and diastolic heart failure: Continue IV Lasix, -2.1 L so far, weight 159-follow. Continue metoprolol, resume and Imdur. Suspect not on a ACE/ARB due to CKD. Last EF 30-35% in November 2015.  Active Problems: Minimally elevated troponins: Trend is flat-not consistent with ACS, likely secondary to demand ischemia from CHF. Cardiology following.  Severe MR, TR and moderate PR: Previously not felt to be a surgical candidate.   Type 2 diabetes: CBG stable, continue 5 units of Lantus and SSI.  Dyslipidemia: Continue Lipitor  GERD: Continue PPI  CKD stage IV: Creatinine close to usual baseline, monitor closely while on diuretics.  Disposition: Remain inpatient  Antimicrobial agents  See below  Anti-infectives    None      DVT Prophylaxis: Prophylactic Lovenox   Code Status: Full code   Family Communication None at bedside  Procedures: None  CONSULTS:  cardiology  Time spent 35 minutes-Greater than 50% of this time was spent in counseling, explanation of diagnosis, planning of further management, and coordination of care.  MEDICATIONS: Scheduled Meds: . amLODipine  5 mg Oral Daily  . aspirin  81 mg Oral Daily  . atorvastatin  40 mg Oral q1800  . enoxaparin (LOVENOX) injection  30 mg Subcutaneous Q24H  . furosemide  60 mg Intravenous Q12H  . hydrALAZINE  12.5 mg Oral 3 times per day  . insulin aspart  0-9 Units Subcutaneous TID WC  . insulin glargine  5 Units Subcutaneous QHS  . isosorbide mononitrate  60 mg Oral Daily  . loratadine  10 mg Oral Daily  . magnesium oxide  400 mg Oral BID  . metoprolol succinate  25 mg Oral Daily  . pantoprazole  40 mg Oral Daily    . sodium chloride flush  3 mL Intravenous Q12H   Continuous Infusions:  PRN Meds:.sodium chloride, acetaminophen, albuterol, ondansetron (ZOFRAN) IV, polyethylene glycol, sodium chloride flush, zolpidem    PHYSICAL EXAM: Vital signs in last 24 hours: Filed Vitals:   12/21/15 0017 12/21/15 0447 12/21/15 1052 12/21/15 1055  BP: 157/73 144/60 161/72   Pulse: 59 56  59  Temp: 98 F (36.7 C) 98.2 F (36.8 C)    TempSrc: Oral Oral    Resp: 20 20    Weight: 72.439 kg (159 lb 11.2 oz)     SpO2: 97% 100%      Weight change:  Filed Weights   12/21/15 0017  Weight: 72.439 kg (159 lb 11.2 oz)   Body mass index is 29.2 kg/(m^2).   Gen Exam: Awake and alert with clear speech.   Neck: Supple, + JVD.   Chest: Few bibasilar rales CVS: S1 S2 Regular, no murmurs.  Abdomen: soft, BS +, non tender, non distended.  Extremities: ++edema, lower extremities warm to touch. Neurologic: Non Focal.   Skin: No Rash.   Wounds: N/A.    Intake/Output from previous day:  Intake/Output Summary (Last 24 hours) at 12/21/15 1312 Last data filed at 12/21/15 1100  Gross per 24 hour  Intake      3 ml  Output   2195 ml  Net  -2192 ml     LAB RESULTS: CBC  Recent Labs Lab 12/20/15 1701  12/20/15 1948  WBC 4.7  --   HGB 11.2* 13.9  HCT 34.4* 41.0  PLT 231  --   MCV 84.3  --   MCH 27.5  --   MCHC 32.6  --   RDW 18.5*  --     Chemistries   Recent Labs Lab 12/20/15 1701 12/20/15 1948 12/21/15 0223 12/21/15 1225  NA 139 143 139 140  K 5.8* 4.2 5.9* 4.2  CL 108 106 108 109  CO2 21*  --  21* 22  GLUCOSE 171* 156* 180* 147*  BUN 32* 33* 31* 31*  CREATININE 2.10* 1.90* 2.02* 1.93*  CALCIUM 9.3  --  9.4 9.4    CBG:  Recent Labs Lab 12/21/15 0600 12/21/15 1155  GLUCAP 114* 128*    GFR Estimated Creatinine Clearance: 21.7 mL/min (by C-G formula based on Cr of 1.93).  Coagulation profile  Recent Labs Lab 12/21/15 0011  INR 1.34    Cardiac Enzymes  Recent Labs Lab  12/21/15 0011 12/21/15 0523 12/21/15 1126  TROPONINI 0.04* 0.04* 0.14*    Invalid input(s): POCBNP No results for input(s): DDIMER in the last 72 hours. No results for input(s): HGBA1C in the last 72 hours. No results for input(s): CHOL, HDL, LDLCALC, TRIG, CHOLHDL, LDLDIRECT in the last 72 hours. No results for input(s): TSH, T4TOTAL, T3FREE, THYROIDAB in the last 72 hours.  Invalid input(s): FREET3 No results for input(s): VITAMINB12, FOLATE, FERRITIN, TIBC, IRON, RETICCTPCT in the last 72 hours.  Recent Labs  12/21/15 0011  LIPASE 22    Urine Studies No results for input(s): UHGB, CRYS in the last 72 hours.  Invalid input(s): UACOL, UAPR, USPG, UPH, UTP, UGL, UKET, UBIL, UNIT, UROB, ULEU, UEPI, UWBC, URBC, UBAC, CAST, UCOM, BILUA  MICROBIOLOGY: No results found for this or any previous visit (from the past 240 hour(s)).  RADIOLOGY STUDIES/RESULTS: Dg Chest 2 View  12/20/2015  CLINICAL DATA:  For 3 years patient has been severely SOB. Patient states she can not remember a lot. HX CHF, CAD, HTN, Diabetes, myocardial infarction, pneumonia, stroke 2011, cardiac catheterization EXAM: CHEST  2 VIEW COMPARISON:  11/04/2015 FINDINGS: Heart size is markedly enlarged but stable in appearance. Interstitial edema again noted. Small right pleural effusion. There are no focal consolidations. Visualized osseous structures have a normal appearance. IMPRESSION: Stable marked enlargement of the heart/pericardial silhouette. CHF/interstitial edema and small right effusion. Electronically Signed   By: Nolon Nations M.D.   On: 12/20/2015 17:55    Oren Binet, MD  [facility] Pager:[phone]  If 7PM-7AM, please contact night-coverage [email or web address] Password [email or web address] 12/21/2015, 1:12 PM   LOS: 1 day

## 2015-12-21 NOTE — Progress Notes (Signed)
Utilization review completed. Aribella Vavra, RN, BSN. 

## 2015-12-21 NOTE — NC FL2 (Signed)
[place] LEVEL OF CARE SCREENING TOOL     IDENTIFICATION  Patient Name: [name] Birthdate: 1933-05-15 Sex: female Admission Date (Current Location): 12/20/2015  [facility] and [place] Number:  Herbalist and Address:  [facility] [place], [place] [address] [place], [place] [address]      Provider Number: [id number]  Attending Physician Name and Address:  Jonetta Osgood, MD  Relative Name and Phone Number:  [name], son, [phone]    Current Level of Care: Hospital Recommended Level of Care: [place] Prior Approval Number:    Date Approved/Denied:   PASRR Number: [id number]  Discharge Plan: SNF    Current Diagnoses: Patient Active Problem List   Diagnosis Date Noted  . CHF exacerbation ([place]) 12/20/2015  . Suprapubic abdominal pain 12/20/2015  . Severe tricuspid regurgitation 11/07/2015  . Moderate pulmonary valve insufficiency 11/07/2015  . Severe mitral regurgitation   . Dyspnea 11/04/2015  . Congestive heart failure (CHF) ([place]) 11/04/2015  . Chest pain 11/04/2015  . Diabetes mellitus due to underlying condition with hyperosmolarity without coma, without long-term current use of insulin ([place])   . Paroxysmal atrial fibrillation (HCC)   . Nutritional anemia 05/24/2015  . Anemia of chronic kidney failure 04/26/2015  . Anemia requiring transfusions   . Blood loss anemia 04/04/2015  . Acute on chronic combined systolic and diastolic CHF (congestive heart failure) ([place]) 04/04/2015  . Stroke ([place]) 10/23/2014  . Embolic stroke ([place]) [id number]  . Bleeding gastrointestinal 10/23/2014  . Type 2 diabetes mellitus with other circulatory complications ([place]) [id number]  . HLD (hyperlipidemia) 10/23/2014  . Acute on chronic systolic CHF (congestive heart failure), NYHA class 3 ([place]) 08/22/2014  . Non-ischemic cardiomyopathy ([place]) 08/22/2014  . Mitral regurgitation 08/22/2014  . CKD (chronic kidney disease) stage  4, GFR 15-29 ml/min (HCC) 08/21/2014  . Acute bronchitis 08/19/2014  . Acute respiratory failure ([place]) 08/19/2014  . Acute on chronic combined systolic and diastolic heart failure ([place]) 08/19/2014  . GIB (gastrointestinal bleeding) 07/11/2014  . Angiodysplasia of intestine with hemorrhage 05/13/2014  . Anemia 05/10/2014  . GI bleed 05/10/2014  . Melena 05/10/2014  . Ataxia, late effect of cerebrovascular disease 03/23/2014  . CVA (cerebral infarction) 02/10/2014  . Erosive gastritis with hemorrhage 02/08/2014  . Acute bilat watershed infarction vs. embolic strokes [id number]  . Chronic diastolic CHF EF [id number]  . NSTEMI (non-ST elevated myocardial infarction) ([place]) 02/04/2014  . Nonischemic cardiomyopathy ([place]) 12/12/2013  . ARF (acute renal failure) ([place]) 12/05/2013  . Memory difficulties 12/04/2013  . Irregular heart rhythm 02/26/2012  . Ventricular bigeminy 02/26/2012  . CAD (coronary artery disease)   . CEREBRAL EMBOLISM, WITH INFARCTION 07/02/2010  . OSTEOPOROSIS 03/21/2009  . DEGENERATIVE JOINT DISEASE 07/24/2008  . INSOMNIA-SLEEP DISORDER-UNSPEC 07/24/2008  . Iron deficiency anemia secondary to blood loss (chronic) 01/06/2008  . Obstructive sleep apnea 01/06/2008  . GASTROPARESIS, DIABETIC 08/03/2006  . Hyperlipidemia 08/03/2006  . PERIPHERAL NEUROPATHY 08/03/2006  . Essential hypertension 08/03/2006  . Chronic diastolic congestive heart failure ([place]) 08/03/2006  . REACTIVE AIRWAY DISEASE 08/03/2006  . GERD 08/03/2006  . DM (diabetes mellitus), type 2, uncontrolled ([place]) 11/04/1983    Orientation RESPIRATION BLADDER Height & Weight     Self, Time, Situation, Place  Normal Continent, Indwelling catheter (Urethral catheter) Weight: 72.439 kg (159 lb 11.2 oz) Height:     BEHAVIORAL SYMPTOMS/MOOD NEUROLOGICAL BOWEL NUTRITION STATUS      Continent  (Please see DC summary)  AMBULATORY STATUS COMMUNICATION  OF NEEDS Skin   Supervision Verbally Normal                        Personal Care Assistance Level of Assistance  Bathing, Feeding, Dressing Bathing Assistance: Limited assistance Feeding assistance: Independent Dressing Assistance: Limited assistance     Functional Limitations Info             SPECIAL CARE FACTORS FREQUENCY  PT (By licensed PT), OT (By licensed OT)     PT Frequency: min 3x/week OT Frequency: min 2x/week            Contractures      Additional Factors Info  Code Status, Allergies, Insulin Sliding Scale Code Status Info: Full Allergies Info: Baking Soda-fluoride, Magnesium Hydroxide   Insulin Sliding Scale Info: insulin aspart (novoLOG) injection 0-9 Units;insulin glargine (LANTUS) injection 5 Units       Current Medications (12/21/2015):  This is the current hospital active medication list Current Facility-Administered Medications  Medication Dose Route Frequency Provider Last Rate Last Dose  . 0.9 %  sodium chloride infusion  250 mL Intravenous PRN Ivor Costa, MD      . acetaminophen (TYLENOL) tablet 500 mg  500 mg Oral Q6H PRN Ivor Costa, MD   500 mg at 12/21/15 0023  . albuterol (PROVENTIL) (2.5 MG/3ML) 0.083% nebulizer solution 2.5 mg  2.5 mg Nebulization Q6H PRN Ivor Costa, MD   2.5 mg at 12/21/15 1111  . amLODipine (NORVASC) tablet 5 mg  5 mg Oral Daily Ivor Costa, MD   5 mg at 12/21/15 1052  . aspirin chewable tablet 81 mg  81 mg Oral Daily Ivor Costa, MD   81 mg at 12/21/15 1111  . atorvastatin (LIPITOR) tablet 40 mg  40 mg Oral q1800 Ivor Costa, MD      . carvedilol (COREG) tablet 6.25 mg  6.25 mg Oral BID WC Sueanne Margarita, MD      . enoxaparin (LOVENOX) injection 30 mg  30 mg Subcutaneous Q24H Romona Curls, RPH   30 mg at 12/21/15 1053  . furosemide (LASIX) injection 60 mg  60 mg Intravenous Q12H Ivor Costa, MD   60 mg at 12/21/15 0703  . hydrALAZINE (APRESOLINE) tablet 25 mg  25 mg Oral 3 times per day Sueanne Margarita, MD      . insulin aspart (novoLOG) injection 0-9 Units  0-9 Units Subcutaneous TID  WC Ivor Costa, MD   1 Units at 12/21/15 1211  . insulin glargine (LANTUS) injection 5 Units  5 Units Subcutaneous QHS Ivor Costa, MD   5 Units at 12/21/15 0149  . isosorbide mononitrate (IMDUR) 24 hr tablet 60 mg  60 mg Oral Daily Ivor Costa, MD   60 mg at 12/21/15 1052  . loratadine (CLARITIN) tablet 10 mg  10 mg Oral Daily Ivor Costa, MD   10 mg at 12/21/15 1054  . magnesium oxide (MAG-OX) tablet 400 mg  400 mg Oral BID Ivor Costa, MD   400 mg at 12/21/15 1052  . ondansetron (ZOFRAN) injection 4 mg  4 mg Intravenous Q6H PRN Ivor Costa, MD      . pantoprazole (PROTONIX) EC tablet 40 mg  40 mg Oral Daily Ivor Costa, MD   40 mg at 12/21/15 1052  . polyethylene glycol (MIRALAX / GLYCOLAX) packet 17 g  17 g Oral Daily PRN Ivor Costa, MD      . sodium chloride flush (NS) 0.9 % injection 3 mL  3  mL Intravenous Q12H Ivor Costa, MD   3 mL at 12/21/15 1053  . sodium chloride flush (NS) 0.9 % injection 3 mL  3 mL Intravenous PRN Ivor Costa, MD      . zolpidem (AMBIEN) tablet 5 mg  5 mg Oral QHS PRN Ivor Costa, MD         Discharge Medications: Please see discharge summary for a list of discharge medications.  Relevant Imaging Results:  Relevant Lab Results:   Additional Information SSN: [place], [place]

## 2015-12-21 NOTE — Progress Notes (Signed)
Occupational Therapy Evaluation Patient Details Name: [name] MRN: [id number] DOB: 1933/06/29 Today's Date: 12/21/2015    History of Present Illness 82 y.o. female with PMH of chronic combined systolic and diastolic congestive heart failure with an EF 30-35%, hyperlipidemia, diabetes mellitus, GERD, embolic stroke, GI bleeding, uterine cancer, CAD, MI, chronic kidney disease-stage IV, tricuspid valve regurgitation, PAF (not on anticoagulants possibly due to history of GI bleeding), who presents with a shortness present worsening leg edema.   Clinical Impression   Patient presents to OT with decreased ADL independence and safety due to the deficits listed below. She will benefit from skilled OT to maximize function and facilitate a safe discharge. OT will follow.    Follow Up Recommendations  Home health OT;Supervision/Assistance - 24 hour    Equipment Recommendations  None recommended by OT    Recommendations for Other Services PT consult     Precautions / Restrictions Precautions Precautions: Fall Restrictions Weight Bearing Restrictions: No      Mobility Bed Mobility Overal bed mobility: Needs Assistance Bed Mobility: Supine to Sit     Supine to sit: Mod assist (increased time, assistance with legs and trunk)        Transfers Overall transfer level: Needs assistance Equipment used: Rolling walker (2 wheeled);None Transfers: Sit to/from [employer] Sit to Stand: Mod assist   Squat pivot transfers: Mod assist          Balance                                            ADL Overall ADL's : Needs assistance/impaired Eating/Feeding: Set up;Bed level   Grooming: Wash/dry hands;Wash/dry face;Set up;Sitting                   Toilet Transfer: Moderate assistance;Stand-pivot;RW (simulated to recliner)           Functional mobility during ADLs: Moderate assistance;Rolling walker General ADL Comments: Patient  agreeable to OT session. Nurse tech in at beginning of session reports it took 3 people to transfer her to [facility] earlier. Patient mod A to get to EOB, mod A to stand at [facility]. Legs shaky/knees buckle without warning and pt sat on bed after about 30 seconds standing. Patient wanted to get up to recliner. Got recliner and chair alarm ready, pt attempted transfer with RW but knees buckled, so did squat pivot transfer without walker to chair with mod A. Positioned comfortably in recliner.      Vision     Perception     Praxis      Pertinent Vitals/Pain Pain Assessment: No/denies pain     Hand Dominance Right   Extremity/Trunk Assessment Upper Extremity Assessment Upper Extremity Assessment: Generalized weakness   Lower Extremity Assessment Lower Extremity Assessment: Generalized weakness (legs shaking/knees buckle in standing even with RW)       Communication Communication Communication: HOH   Cognition Arousal/Alertness: Awake/alert Behavior During Therapy: Flat affect Overall Cognitive Status: Within Functional Limits for tasks assessed                     General Comments       Exercises       Shoulder Instructions      Home Living Family/patient expects to be discharged to:: Private residence Living Arrangements: Other relatives;Children (granddaughter) Available Help at Discharge: Family;Available 24 hours/day;Personal care attendant (  aide 5 days/week x 2 hours/day) Type of Home: House Home Access: Stairs to enter [employer] of Steps: 2 Entrance Stairs-Rails: None Home Layout: One level     Bathroom Shower/Tub: Teacher, Afreen Siebels years/pre: Standard Bathroom Accessibility: No   Home Equipment: Environmental consultant - 2 wheels;Shower seat;Wheelchair - manual          Prior Functioning/Environment Level of Independence: Needs assistance  Gait / Transfers Assistance Needed: supervision for ambulation with RW ADL's / Homemaking Assistance Needed:  needs assistance with bathing/dressing/toileting        OT Diagnosis: Generalized weakness   OT Problem List: Decreased strength;Decreased activity tolerance;Impaired balance (sitting and/or standing);Decreased safety awareness;Cardiopulmonary status limiting activity;Increased edema   OT Treatment/Interventions: Self-care/ADL training;Energy conservation;DME and/or AE instruction;Therapeutic activities;Patient/family education    OT Goals(Current goals can be found in the care plan section) Acute Rehab OT Goals Patient Stated Goal: to get in the chair OT Goal Formulation: With patient Time For Goal Achievement: 01/04/16 Potential to Achieve Goals: Good  OT Frequency: Min 2X/week   Barriers to D/C:            Co-evaluation              End of Session Equipment Utilized During Treatment: Surveyor, mining Communication: Mobility status;Other (comment) (need for chair alarm)  Activity Tolerance: Patient tolerated treatment well Patient left: in chair;with call bell/phone within reach;with chair alarm set;with nursing/sitter in room   Time: 1138-1203 OT Time Calculation (min): 25 min Charges:  OT General Charges $OT Visit: 1 Procedure OT Evaluation $OT Eval Moderate Complexity: 1 Procedure OT Treatments $Therapeutic Activity: 8-22 mins G-Codes:    Mylinh Cragg A January 02, 2016, 12:21 PM

## 2015-12-21 NOTE — Evaluation (Signed)
Physical Therapy Evaluation Patient Details Name: ETHYL GAIN MRN: [id number] DOB: January 24, 1933 Today's Date: 12/21/2015   History of Present Illness  82 y.o. female with PMH of chronic combined systolic and diastolic congestive heart failure with an EF 30-35%, hyperlipidemia, diabetes mellitus, GERD, embolic stroke, GI bleeding, uterine cancer, CAD, MI, chronic kidney disease-stage IV, tricuspid valve regurgitation, PAF (not on anticoagulants possibly due to history of GI bleeding), who presents with a shortness present worsening leg edema.  Clinical Impression  Patient presents with decreased independence with mobility due to deficits listed in PT problem list.  She will benefit from skilled PT in the acute setting to allow return home with family support and HHPT.   Follow Up Recommendations Home health PT;Supervision/Assistance - 24 hour    Equipment Recommendations  Hospital bed    Recommendations for Other Services       Precautions / Restrictions Precautions Precautions: Fall      Mobility  Bed Mobility               General bed mobility comments: up in chair  Transfers Overall transfer level: Needs assistance Equipment used: Rolling walker (2 wheeled) Transfers: Sit to/from [employer] Transfers Sit to Stand: Min assist Stand pivot transfers: Mod assist       General transfer comment: up from recliner with armrests after scooted to edge of chair with min A; attempted to ambulate, but pt reported needed to use BSC so pivot with walker stand step to [facility] with knees shaking, not buckling  Ambulation/Gait             General Gait Details:  NT for safety concerns with knees buckling  Stairs            Wheelchair Mobility    Modified Rankin (Stroke Patients Only)       Balance Overall balance assessment: Needs assistance Sitting-balance support: Feet supported Sitting balance-Leahy Scale: Good     Standing balance support: Bilateral  upper extremity supported Standing balance-Leahy Scale: Poor Standing balance comment: knees shaky even with UE support on walker                             Pertinent Vitals/Pain Pain Assessment: No/denies pain    Home Living Family/patient expects to be discharged to:: Private residence Living Arrangements: Other relatives;Children (grandaughter) Available Help at Discharge: Family;Available 24 hours/day;Personal care attendant (aide 5 days/week/2 hours a day) Type of Home: Apartment Home Access: Stairs to enter Entrance Stairs-Rails: None Entrance Stairs-Number of Steps: 1 Home Layout: One level Home Equipment: Walker - 2 wheels;Shower seat;Wheelchair - Regulatory affairs officer / Transfers Assistance Needed: supervision for ambulation with RW  ADL's / Homemaking Assistance Needed: needs assistance with bathing/dressing/toileting        Hand Dominance        Extremity/Trunk Assessment   Upper Extremity Assessment: Defer to OT evaluation           Lower Extremity Assessment: LLE deficits/detail;RLE deficits/detail RLE Deficits / Details: AROM grossly WFL, strength hip flexion 3+/5, knee extension 4-/5, ankle DF 4/5 LLE Deficits / Details: AROM grossly WFL, strength hip flexion 3+/5, knee extension 4-/5, ankle DF 4/5  Cervical / Trunk Assessment: Kyphotic  Communication   Communication: HOH  Cognition Arousal/Alertness: Awake/alert Behavior During Therapy: Flat affect Overall Cognitive Status: No family/caregiver present to determine baseline cognitive functioning  General Comments      Exercises        Assessment/Plan    PT Assessment Patient needs continued PT services  PT Diagnosis Generalized weakness   PT Problem List Decreased strength;Decreased activity tolerance;Decreased balance;Decreased mobility;Decreased knowledge of use of DME  PT Treatment Interventions DME instruction;Gait  training;Balance training;Stair training;Functional mobility training;Patient/family education;Therapeutic activities;Therapeutic exercise   PT Goals (Current goals can be found in the Care Plan section) Acute Rehab PT Goals Patient Stated Goal: To do better getting to her feet PT Goal Formulation: With patient Time For Goal Achievement: 12/28/15 Potential to Achieve Goals: Good    Frequency Min 3X/week   Barriers to discharge        Co-evaluation               End of Session Equipment Utilized During Treatment: Gait belt Activity Tolerance: Patient tolerated treatment well Patient left: in chair;with call bell/phone within reach;with chair alarm set           Time: [id number] PT Time Calculation (min) (ACUTE ONLY): 17 min   Charges:   PT Evaluation $PT Eval Moderate Complexity: 1 Procedure     PT G CodesReginia Naas 12-30-2015, 3:36 PM  Magda Kiel, [place] Dec 30, 2015

## 2015-12-21 NOTE — Consult Note (Addendum)
CARDIOLOGY CONSULT NOTE   Patient ID: [name] MRN: [id number] DOB/AGE: 1932/11/03 82 y.o.  Admit date: 12/20/2015  Primary Physician   [name], MD Primary Cardiologist   Dr. Gwenlyn Found Reason for Consultation   CHF Requesting Physician  Dr. Sloan Leiter  HPI: [name] is a 83 y.o. female with a history of HTN, HLD, DM, CKD stage III, history of stroke, NICM, chronic combined CHF, severe MR, nonobstructive CAD 2011), prior GIB, NSVT, CVA, memory difficulties presented with SOB and LE edema.   She has previously been hospitalized with a stroke and a non-STEMI. Consideration was given to putting her on oral anticoagulant event monitor was obtained which did not show A. Fib with rapid PVCs and a short burst of nonsustained ventricular tachycardia. Admitted 11/04/15-11/07/15 for acute on chronic combined systolic and diastolic heart failure ([place]).   2D echo 11/05/15: mild LVH, EF 30-35%, diffuse HK with akinesis of ant & inf septum, grade 2 DD, high vent filling pressure, mild AI, severe MR, severe LAE, severe TR, mod PR, trivial pericardial effusion.   She was primarily a bed to chair existence in her home and infrequently gets out of the house. She lives with niece and son manages her medications. Presented to ED with 2 days hx of worsening SOB and LE edema. Admits to having dizziness, orthopnea and PND. Denies chest pain during admission today however H&P indicates "intermittent chest pain". Also having dry cough but no congestion, fever or chills. The patient denies nausea, vomiting, fever, chest pain, palpitations, syncope, cough, abdominal pain, hematochezia, melena, lower extremity edema.  BNP was elevated to 1957. CXR showed vascular congestion. K of 5.8-->4.2-->5.8. Scr 2.1-->1.9-->2.02. EKG showed sinus rhythm with non specific intraventricular condition delay. QTC of 532ms, which appears same as prior.  She diuresed negative 2.1 L on IV lasix 60mg  BID.   Past Medical History    Diagnosis Date  . Asthma   . CAD (coronary artery disease)     Pt reports MI in 2006 (no documentation).  Cardiolite in 05/2002 and 07/2006 did not reveal any reversible ischemia.  Pt follows with Dr. Rex K[facility] at [facility].  . CHF (congestive heart failure) (HCC)     EF 25-30% with dilated LV, mild LVH, severe hypokinesis, and mod-severe reduction in RV function  . Osteoporosis   . HYPERTENSION 08/03/2006  . GASTROPARESIS, DIABETIC 08/03/2006  . HYPERLIPIDEMIA 08/03/2006  . OBSTRUCTIVE SLEEP APNEA 01/06/2008  . PERIPHERAL NEUROPATHY 08/03/2006  . GERD 08/03/2006  . LOW BACK PAIN, CHRONIC 08/03/2006  . OSTEOPOROSIS 03/21/2009  . CEREBRAL EMBOLISM, WITH INFARCTION 07/02/2010  . Angina   . Myocardial infarction ([place]) "2 or 3"  . Pneumonia 02/26/12    "a few times; probably even today"  . DIABETES MELLITUS, TYPE II 11/04/1983  . Blood transfusion     multiple in 2012, 2015.   . GI bleed 08/2011 and 01/2014  . Chronic daily headache     migraines as well.   . Stroke [facility]) summer 2011    "made my left hip worse"  . Uterine cancer ([place])   . Nonischemic cardiomyopathy ([place]) 05/2010    Left heart catheterization:2011. Nonobstructive coronary artery disease.  . Pulmonary hypertension ([place])   . NSTEMI (non-ST elevated myocardial infarction) ([place]) 01/2014    type II with CVA  . Acute embolic stroke ([place]) 0000000  . Non-ST elevation MI (NSTEMI) ([place]) 02/04/2014  . E. coli UTI 03/02/2014  . Renal failure, acute ([place]) 02/04/2014  . Iron deficiency  anemia secondary to blood loss (chronic) 01/06/2008    Qualifier: Diagnosis of  By: Tomasa Hosteller MD, Veronique D.   . Nonsustained ventricular tachycardia ([place])   . Angiodysplasia of intestine with hemorrhage 05/13/2014  . CKD (chronic kidney disease) stage 4, GFR 15-29 ml/min (HCC) 08/21/2014     Past Surgical History  Procedure Laterality Date  . Esophagogastroduodenoscopy  08/26/2011    Procedure: ESOPHAGOGASTRODUODENOSCOPY (EGD);  Surgeon: [name], MD;   Location: [facility] ENDOSCOPY;  Service: Endoscopy;  Laterality: N/A;  . Colonoscopy  08/28/2011    Procedure: COLONOSCOPY;  Surgeon: [name], MD;  Location: [place];  Service: Endoscopy;  Laterality: N/A;  . Vaginal hysterectomy    . Tubal ligation    . Cataract extraction w/ intraocular lens  implant, bilateral    . Toe surgery      "right big toe; operated on it to straighten it out; it was under"  . Polysomnogram  10/17/2005    AHI-7.28/hr. AHI REM-20.8/hr. Average oxygen saturation range during REM and NREM was 97%. Lowest oxygen saturation during REM sleep was 90%.  . Carotid duplex  05/28/2010    No significant extracranial carotid artery stenosis demonstrated. Vertebrals are patent w/ antegrade flow.  . Cardiac catheterization  05/24/2010    No intervention - recommed medical therapy.  . Cardiovascular stress test  08/07/2006    Moderate-severe defect seen in Basal inferior, Mid inferoseptal, Mid inferior, Mid inferolateral, and Apical inferior regions - consistent w/ infarct/scar. No scintigraphic evidence of inducible myocardial ischemia.  . Transthoracic echocardiogram  08/29/2011    EF 55-60%, moderate LVH,   . Esophagogastroduodenoscopy N/A 02/08/2014    Procedure: ESOPHAGOGASTRODUODENOSCOPY (EGD);  Surgeon: [name], MD;  Location: [facility] ENDOSCOPY;  Service: Endoscopy;  Laterality: N/A;  . Colonoscopy N/A 05/12/2014    Procedure: COLONOSCOPY;  Surgeon: [name], MD;  Location: [place];  Service: Endoscopy;  Laterality: N/A;  . Enteroscopy N/A 05/13/2014    Procedure: ENTEROSCOPY;  Surgeon: [name], MD;  Location: [place];  Service: Endoscopy;  Laterality: N/A;  . Enteroscopy N/A 07/13/2014    Procedure: small bowel enteroscopy;  Surgeon: Jerene Bears, MD;  Location: [facility] ENDOSCOPY;  Service: Endoscopy;  Laterality: N/A;  requests slim colonoscope    Allergies  Allergen Reactions  . Baking Soda-Fluoride [Sodium Fluoride] Nausea And Vomiting  . Magnesium  Hydroxide Nausea And Vomiting    I have reviewed the patient's current medications . amLODipine  5 mg Oral Daily  . aspirin  81 mg Oral Daily  . atorvastatin  40 mg Oral q1800  . enoxaparin (LOVENOX) injection  30 mg Subcutaneous Q24H  . furosemide  60 mg Intravenous Q12H  . hydrALAZINE  12.5 mg Oral 3 times per day  . insulin aspart  0-9 Units Subcutaneous TID WC  . insulin glargine  5 Units Subcutaneous QHS  . isosorbide mononitrate  60 mg Oral Daily  . loratadine  10 mg Oral Daily  . magnesium oxide  400 mg Oral BID  . metoprolol succinate  25 mg Oral Daily  . pantoprazole  40 mg Oral Daily  . sodium chloride flush  3 mL Intravenous Q12H     sodium chloride, acetaminophen, albuterol, ondansetron (ZOFRAN) IV, polyethylene glycol, sodium chloride flush, zolpidem  Prior to Admission medications   Medication Sig Start Date End Date Taking? Authorizing Provider  acetaminophen (TYLENOL) 500 MG tablet Take 500 mg by mouth every 6 (six) hours as needed for moderate pain or headache.  Yes Historical Provider, MD  albuterol (PROAIR HFA) 108 (90 BASE) MCG/ACT inhaler Inhale 2 puffs into the lungs every 6 (six) hours as needed for wheezing. 05/25/14  Yes Daniel J Angiulli, PA-C  amLODipine (NORVASC) 5 MG tablet TAKE 1 TABLET EVERY DAY 09/17/15  Yes Lorretta Harp, MD  aspirin 81 MG tablet Take 81 mg by mouth daily.   Yes Historical Provider, MD  atorvastatin (LIPITOR) 40 MG tablet TAKE 1 TABLET (40 MG TOTAL) BY MOUTH DAILY AT 6 PM. 11/12/15  Yes Lorretta Harp, MD  hydrALAZINE (APRESOLINE) 25 MG tablet Take 0.5 tablets (12.5 mg total) by mouth every 8 (eight) hours. 02/19/15  Yes Isaiah Serge, NP  insulin glargine (LANTUS) 100 UNIT/ML injection Inject 0.07 mLs (7 Units total) into the skin at bedtime. 11/07/15  Yes Geradine Girt, DO  isosorbide mononitrate (IMDUR) 60 MG 24 hr tablet Take 1 tablet (60 mg total) by mouth daily. 05/25/14  Yes Daniel J Angiulli, PA-C  KLOR-CON 10 10 MEQ tablet  Take 20 mEq by mouth daily.  10/09/14  Yes Historical Provider, MD  loratadine (ALLERGY RELIEF) 10 MG tablet Take 10 mg by mouth daily. Reported on 12/07/2015   Yes Historical Provider, MD  magnesium oxide (MAG-OX) 400 (241.3 MG) MG tablet Take 1 tablet (400 mg total) by mouth 2 (two) times daily. 05/25/14  Yes Daniel J Angiulli, PA-C  metoprolol succinate (TOPROL-XL) 25 MG 24 hr tablet TAKE 1 TABLET (25 MG TOTAL) BY MOUTH DAILY. 08/15/15  Yes Lorretta Harp, MD  omeprazole (PRILOSEC) 20 MG capsule Take 1 capsule (20 mg total) by mouth daily before breakfast. 05/25/14  Yes Lavon Paganini Angiulli, PA-C  polyethylene glycol (MIRALAX / GLYCOLAX) packet Take 17 g by mouth daily as needed for mild constipation.    Yes Historical Provider, MD  torsemide (DEMADEX) 20 MG tablet 20 mg alternating with 40 mg daily 11/07/15  Yes Geradine Girt, DO     Social History   Social History  . Marital Status: Widowed    Spouse Name: N/A  . Number of Children: 7  . Years of Education: N/A   Occupational History  . Disabled    Social History Main Topics  . Smoking status: Never Smoker   . Smokeless tobacco: Former Systems developer    Types: Snuff    Quit date: 10/13/2006     Comment: 02/26/12 "stopped snuff 4-6 years ago"  . Alcohol Use: No     Comment: "stopped drinking alcohol ~ 1980's"  . Drug Use: No  . Sexual Activity: No   Other Topics Concern  . Not on file   Social History Narrative   ** Merged History Encounter **       Lives with daughter    Family Status  Relation Status Death Age  . Mother Deceased   . Father Deceased 33  . Sister Alive   . Child Alive   . Brother Deceased 69   Family History  Problem Relation Age of Onset  . Diabetes insipidus Mother   . Hypertension Mother   . Hypertension Father   . Hypertension Sister   . Hypertension Child   . Stomach cancer Brother      ROS:  Full 14 point review of systems complete and found to be negative unless listed above.  Physical  Exam: Blood pressure 161/72, pulse 59, temperature 98.2 F (36.8 C), temperature source Oral, resp. rate 20, weight 159 lb 11.2 oz (72.439 kg), last menstrual period 12/19/1968,  SpO2 100 %.  General: Frail female in no acute distress who is sitting in chair Head: Eyes PERRLA, No xanthomas. Normocephalic and atraumatic, oropharynx without edema or exudate.  Lungs: Resp regular and unlabored. Diminished breath sound bibasilar R>L.  Heart: RRR no s3, s4. Systolic  murmurs. Neck: No carotid bruits. No lymphadenopathy.  + JVD. Abdomen: Bowel sounds present, abdomen soft and non-tender without masses or hernias noted. Msk:  No spine or cva tenderness. No weakness, no joint deformities or effusions. Extremities: No clubbing, cyanosis. 1+ BL LE edema. DP/PT/Radials 2+ and equal bilaterally. Neuro: Alert and oriented X 3. No focal deficits noted. Psych:  Good affect, responds appropriately Skin: No rashes or lesions noted.  Labs:   Lab Results  Component Value Date   WBC 4.7 12/20/2015   HGB 13.9 12/20/2015   HCT 41.0 12/20/2015   MCV 84.3 12/20/2015   PLT 231 12/20/2015    Recent Labs  12/21/15 0011  INR 1.34    Recent Labs Lab 12/21/15 0223  NA 139  K 5.9*  CL 108  CO2 21*  BUN 31*  CREATININE 2.02*  CALCIUM 9.4  GLUCOSE 180*   MAGNESIUM  Date Value Ref Range Status  08/19/2014 2.0 1.5 - 2.5 mg/dL Final    Recent Labs  12/21/15 0011 12/21/15 0523  TROPONINI 0.04* 0.04*    Recent Labs  12/20/15 1755  TROPIPOC 0.05   PRO B NATRIURETIC PEPTIDE (BNP)  Date/Time Value Ref Range Status  08/22/2014 07:40 AM 13748.0* 0 - 450 pg/mL Final  08/19/2014 08:38 AM 17664.0* 0 - 450 pg/mL Final   Lab Results  Component Value Date   CHOL 114 11/05/2015   HDL 60 11/05/2015   LDLCALC 43 11/05/2015   TRIG 57 11/05/2015   No results found for: DDIMER LIPASE  Date/Time Value Ref Range Status  12/21/2015 12:11 AM 22 11 - 51 U/L Final   Echo: 11/05/15 LV EF: 30% -  35%  ------------------------------------------------------------------- Indications: Dyspnea 786.09.  ------------------------------------------------------------------- History: PMH: Non-ischemic Cardiomyopathy. Myocardial infarction. Stroke. Risk factors: Diabetes mellitus.  ------------------------------------------------------------------- Study Conclusions  - Left ventricle: The cavity size was normal. There was mild  concentric hypertrophy. Systolic function was moderately to  severely reduced. The estimated ejection fraction was in the  range of 30% to 35%. Diffuse hypokinesis with akinesis of the  anterior and inferio septum. Features are consistent with a  pseudonormal left ventricular filling pattern, with concomitant  abnormal relaxation and increased filling pressure (grade 2  diastolic dysfunction). Doppler parameters are consistent with  high ventricular filling pressure. - Aortic valve: There was mild regurgitation. - Mitral valve: There was severe regurgitation. Severe  regurgitation is suggested by pulmonary vein systolic flow  reversal. Valve area by continuity equation (using LVOT flow):  0.85 cm^2. Calculations suggest modrate mitral regugitation.  However, the pulmonary vein systolic reversal, triangular-shape  MR Dopple signal, left atrium size, and vena conracta suggest  that the regurgitation is more severe. - Left atrium: The atrium was severely dilated. - Right ventricle: The cavity size was normal. Wall thickness was  normal. Systolic function was mildly reduced. - Atrial septum: No defect or patent foramen ovale was identified  by color flow Doppler. - Tricuspid valve: There was severe regurgitation. A diagnosis of  severe regurgitation is supported by hepatic vein systolic flow  reversal. - Pulmonic valve: There was moderate regurgitation. - Pulmonary arteries: Systolic pressure was severely increased. PA  peak  pressure: 69 mm Hg (S). - Inferior vena cava: The  vessel was dilated. The respirophasic  diameter changes were blunted (< 50%), consistent with elevated  central venous pressure. - Pericardium, extracardiac: A trivial pericardial effusion was  identified posterior to the heart.  ECG:   Vent. rate 63 BPM PR interval 195 ms QRS duration 159 ms QT/QTc 515/527 ms P-R-T axes 85 228 -1  Radiology:  Dg Chest 2 View  12/20/2015  CLINICAL DATA:  For 3 years patient has been severely SOB. Patient states she can not remember a lot. HX CHF, CAD, HTN, Diabetes, myocardial infarction, pneumonia, stroke 2011, cardiac catheterization EXAM: CHEST  2 VIEW COMPARISON:  11/04/2015 FINDINGS: Heart size is markedly enlarged but stable in appearance. Interstitial edema again noted. Small right pleural effusion. There are no focal consolidations. Visualized osseous structures have a normal appearance. IMPRESSION: Stable marked enlargement of the heart/pericardial silhouette. CHF/interstitial edema and small right effusion. Electronically Signed   By: Nolon Nations M.D.   On: 12/20/2015 17:55    ASSESSMENT AND PLAN:      1. Acute on chronic combined CHF  - Elevated BNP, vascular congestion on chest xray and volume overload on exam. - Diuresed and net negative 2.1 L on IV lasix 60mg  BID. Seems Scr at baseline. Continue Iv diuresis.  - 2D echo 11/05/15: mild LVH, EF 30-35%, diffuse HK with akinesis of ant & inf septum, grade 2 DD, high vent filling pressure, mild AI, severe MR, severe LAE, severe TR, mod PR, trivial pericardial effusion.  - Pending repeat echo this admission.   2. Severe MR, severe TR, mod PR - previously not felt to be a surgical candidate. Continue med rx.  3. HTN - minimally elvated.   4. CKD stage IV - Cr appears to be hovering around baseline of 1.7-2.2 this year.  5. Elevated troponin: - likely in setting of #1. Flat trend to 0.04, not ACS pattern. Given comorbidities including CKD  (and prior history of ARF), anticipate continued medical management. Continue ASA, BB, Imdur.   6. Hyperkalemia - K of 5.8-->4.2-->5.8. Likely hemolyzed.  - Will get another BMET.    SignedLeanor Kail, PA 12/21/2015, 12:12 PM Pager [phone]  Co-Sign MD  Patient seen and examined with Leanor Kail, [place]. We discussed all aspects of the encounter. I agree with the assessment and plan as stated above with minor modifications.  Admitted with acute on chronic combined CHF exacerbation.  Diuresing well on IV lasix.  Would continue for now. Renal function stable.  She has history of severe MR/TR in the past and was considered not to be a surgical candidate.  EF 30-35%.  She is debilitated with a bed to chair existence so medical management is recommended.  Trop minimally elevated and c/w CHF.  Would continue to cycle to see trend.  Marked enlargement of cardiac silhouette on Cxray.  Will get 2D echo to rule out pericardial effusion.  Continue BB/long acting nitrates and hydralazine for LV dysfunction. BP elevated so will increase hydralazine to 25mg  TID.  Change Toprol to Coreg 6.25mg  BID and titrate as BP tolerates.  Not a candidate for ACE I due to CKD.  Signed: Fransico Him, MD [facility] 12/21/2015

## 2015-12-21 NOTE — Progress Notes (Signed)
Patient lying in bed, no pain, distress or needs at this time. Bed alarm on, call light within reach.

## 2015-12-22 DIAGNOSIS — N184 Chronic kidney disease, stage 4 (severe): Secondary | ICD-10-CM

## 2015-12-22 DIAGNOSIS — I38 Endocarditis, valve unspecified: Secondary | ICD-10-CM

## 2015-12-22 LAB — GLUCOSE, CAPILLARY
GLUCOSE-CAPILLARY: 130 mg/dL — AB (ref 65–99)
GLUCOSE-CAPILLARY: 200 mg/dL — AB (ref 65–99)
Glucose-Capillary: 188 mg/dL — ABNORMAL HIGH (ref 65–99)
Glucose-Capillary: 163 mg/dL — ABNORMAL HIGH (ref 65–99)

## 2015-12-22 LAB — URINE CULTURE: CULTURE: NO GROWTH

## 2015-12-22 LAB — BASIC METABOLIC PANEL
Anion gap: 9 (ref 5–15)
BUN: 35 mg/dL — AB (ref 6–20)
CALCIUM: 9.1 mg/dL (ref 8.9–10.3)
CHLORIDE: 108 mmol/L (ref 101–111)
CO2: 23 mmol/L (ref 22–32)
CREATININE: 1.99 mg/dL — AB (ref 0.44–1.00)
GFR calc non Af Amer: 23 mL/min — ABNORMAL LOW (ref >60)
GFR, EST AFRICAN AMERICAN: 26 mL/min — AB (ref >60)
Glucose, Bld: 188 mg/dL — ABNORMAL HIGH (ref 65–99)
Potassium: 3.9 mmol/L (ref 3.5–5.1)
SODIUM: 140 mmol/L (ref 135–145)

## 2015-12-22 NOTE — Progress Notes (Signed)
PATIENT DETAILS Name: [name] Age: 82 y.o. Sex: female Date of Birth: 09/06/1933 Admit Date: 12/20/2015 Admitting Physician Ivor Costa, MD [id number] Elyse Hsu, MD  Subjective: Feels better than yesterday. Left lower extremity edema. Less shortness of breath.  Assessment/Plan: Principal Problem: Acute on chronic combined systolic and diastolic heart failure: Continue IV Lasix, -4.9 L so far, weight appears erratic and unreliable. Continue metoprolol, resume and Imdur. Suspect not on a ACE/ARB due to CKD. EF 20-25 % on echo this admission-also shows severe pulmonary hypertension which I suspect is contributing to peripheral edema..  Active Problems: Minimally elevated troponins: Trend is flat-not consistent with ACS, likely secondary to demand ischemia from CHF. Cardiology following.  Severe MR, TR and moderate PR: Previously not felt to be a surgical candidate.   Severe pulmonary hypertension: Seen on echo-PA pressure around 62 mmHg. Likely secondary to severe valvular abnormalities, chronic systolic heart failure. Managed to manage conservatively.  Type 2 diabetes: CBG stable, continue 5 units of Lantus and SSI.  Dyslipidemia: Continue Lipitor  GERD: Continue PPI  CKD stage IV: Creatinine close to usual baseline, monitor closely while on diuretics.  Disposition: Remain inpatient  Antimicrobial agents  See below  Anti-infectives    None      DVT Prophylaxis: Prophylactic Lovenox   Code Status: Full code   Family Communication Son at bedside  Procedures: None  CONSULTS:  cardiology  Time spent 25 minutes-Greater than 50% of this time was spent in counseling, explanation of diagnosis, planning of further management, and coordination of care.  MEDICATIONS: Scheduled Meds: . amLODipine  5 mg Oral Daily  . aspirin  81 mg Oral Daily  . atorvastatin  40 mg Oral q1800  . carvedilol  6.25 mg Oral BID WC  . enoxaparin (LOVENOX) injection  30  mg Subcutaneous Q24H  . furosemide  60 mg Intravenous Q12H  . hydrALAZINE  25 mg Oral 3 times per day  . insulin aspart  0-9 Units Subcutaneous TID WC  . insulin glargine  5 Units Subcutaneous QHS  . isosorbide mononitrate  60 mg Oral Daily  . loratadine  10 mg Oral Daily  . magnesium oxide  400 mg Oral BID  . pantoprazole  40 mg Oral Daily  . sodium chloride flush  3 mL Intravenous Q12H   Continuous Infusions:  PRN Meds:.sodium chloride, acetaminophen, albuterol, ondansetron (ZOFRAN) IV, polyethylene glycol, sodium chloride flush, zolpidem    PHYSICAL EXAM: Vital signs in last 24 hours: Filed Vitals:   12/21/15 1717 12/21/15 1725 12/21/15 1957 12/22/15 0412  BP: 151/62  137/61 147/57  Pulse:  65 62 52  Temp:   97.4 F (36.3 C) 97.9 F (36.6 C)  TempSrc:   Oral Oral  Resp:   18 18  Weight:    73.936 kg (163 lb)  SpO2:   99% 100%    Weight change: 1.497 kg (3 lb 4.8 oz) Filed Weights   12/21/15 0017 12/22/15 0412  Weight: 72.439 kg (159 lb 11.2 oz) 73.936 kg (163 lb)   Body mass index is 29.81 kg/(m^2).   Gen Exam: Awake and alert with clear speech.  Not in any distress. Neck: Supple   Chest: Few bibasilar rales CVS: S1 S2 Regular, no murmurs.  Abdomen: soft, BS +, non tender, non distended.  Extremities: ++edema, lower extremities warm to touch. Neurologic: Non Focal.   Skin: No Rash.   Wounds: N/A.  Intake/Output from previous day:  Intake/Output Summary (Last 24 hours) at 12/22/15 1409 Last data filed at 12/22/15 1203  Gross per 24 hour  Intake    240 ml  Output   2975 ml  Net  -2735 ml     LAB RESULTS: CBC  Recent Labs Lab 12/20/15 1701 12/20/15 1948  WBC 4.7  --   HGB 11.2* 13.9  HCT 34.4* 41.0  PLT 231  --   MCV 84.3  --   MCH 27.5  --   MCHC 32.6  --   RDW 18.5*  --     Chemistries   Recent Labs Lab 12/20/15 1701 12/20/15 1948 12/21/15 0223 12/21/15 1225 12/22/15 0241  NA 139 143 139 140 140  K 5.8* 4.2 5.9* 4.2 3.9  CL  108 106 108 109 108  CO2 21*  --  21* 22 23  GLUCOSE 171* 156* 180* 147* 188*  BUN 32* 33* 31* 31* 35*  CREATININE 2.10* 1.90* 2.02* 1.93* 1.99*  CALCIUM 9.3  --  9.4 9.4 9.1    CBG:  Recent Labs Lab 12/21/15 0600 12/21/15 1155 12/21/15 1731 12/22/15 1151  GLUCAP 114* 128* 236* 130*    GFR Estimated Creatinine Clearance: 21.2 mL/min (by C-G formula based on Cr of 1.99).  Coagulation profile  Recent Labs Lab 12/21/15 0011  INR 1.34    Cardiac Enzymes  Recent Labs Lab 12/21/15 0011 12/21/15 0523 12/21/15 1126  TROPONINI 0.04* 0.04* 0.14*    Invalid input(s): POCBNP No results for input(s): DDIMER in the last 72 hours. No results for input(s): HGBA1C in the last 72 hours. No results for input(s): CHOL, HDL, LDLCALC, TRIG, CHOLHDL, LDLDIRECT in the last 72 hours. No results for input(s): TSH, T4TOTAL, T3FREE, THYROIDAB in the last 72 hours.  Invalid input(s): FREET3 No results for input(s): VITAMINB12, FOLATE, FERRITIN, TIBC, IRON, RETICCTPCT in the last 72 hours.  Recent Labs  12/21/15 0011  LIPASE 22    Urine Studies No results for input(s): UHGB, CRYS in the last 72 hours.  Invalid input(s): UACOL, UAPR, USPG, UPH, UTP, UGL, UKET, UBIL, UNIT, UROB, ULEU, UEPI, UWBC, URBC, UBAC, CAST, UCOM, BILUA  MICROBIOLOGY: Recent Results (from the past 240 hour(s))  Urine culture     Status: None   Collection Time: 12/21/15  2:37 AM  Result Value Ref Range Status   Specimen Description URINE, CATHETERIZED  Final   Special Requests NONE  Final   Culture NO GROWTH 1 DAY  Final   Report Status 12/22/2015 FINAL  Final    RADIOLOGY STUDIES/RESULTS: Dg Chest 2 View  12/20/2015  CLINICAL DATA:  For 3 years patient has been severely SOB. Patient states she can not remember a lot. HX CHF, CAD, HTN, Diabetes, myocardial infarction, pneumonia, stroke 2011, cardiac catheterization EXAM: CHEST  2 VIEW COMPARISON:  11/04/2015 FINDINGS: Heart size is markedly enlarged but  stable in appearance. Interstitial edema again noted. Small right pleural effusion. There are no focal consolidations. Visualized osseous structures have a normal appearance. IMPRESSION: Stable marked enlargement of the heart/pericardial silhouette. CHF/interstitial edema and small right effusion. Electronically Signed   By: Nolon Nations M.D.   On: 12/20/2015 17:55    Oren Binet, MD  [facility] Pager:[phone]  If 7PM-7AM, please contact night-coverage [email or web address] Password [email or web address] 12/22/2015, 2:09 PM   LOS: 2 days

## 2015-12-22 NOTE — Progress Notes (Signed)
Primary cardiologist: Dr. Quay Burow  Seen for followup: Congestive heart failure  Subjective:    States that she is breathing more easily, no chest pain.  Objective:   Temp:  [97.4 F (36.3 C)-98.2 F (36.8 C)] 97.9 F (36.6 C) (03/11 0412) Pulse Rate:  [52-65] 52 (03/11 0412) Resp:  [18-19] 18 (03/11 0412) BP: (137-155)/(57-75) 147/57 mmHg (03/11 0412) SpO2:  [99 %-100 %] 100 % (03/11 0412) Weight:  [163 lb (73.936 kg)] 163 lb (73.936 kg) (03/11 0412) Last BM Date: 12/20/15  Filed Weights   12/21/15 0017 12/22/15 0412  Weight: 159 lb 11.2 oz (72.439 kg) 163 lb (73.936 kg)    Intake/Output Summary (Last 24 hours) at 12/22/15 1216 Last data filed at 12/22/15 1203  Gross per 24 hour  Intake    240 ml  Output   2075 ml  Net  -1835 ml    Telemetry: Sinus rhythm with lead motion artifact.  Exam:  General: Frail-appearing elderly woman in no distress.  Lungs: Decreased breath sounds at the bases.  Cardiac: RRR with 2/6 systolic murmur at the apex.  Abdomen: NABS.  Extremities: Mild leg edema.  Lab Results:  Basic Metabolic Panel:  Recent Labs Lab 12/21/15 0223 12/21/15 1225 12/22/15 0241  NA 139 140 140  K 5.9* 4.2 3.9  CL 108 109 108  CO2 21* 22 23  GLUCOSE 180* 147* 188*  BUN 31* 31* 35*  CREATININE 2.02* 1.93* 1.99*  CALCIUM 9.4 9.4 9.1    CBC:  Recent Labs Lab 12/20/15 1701 12/20/15 1948  WBC 4.7  --   HGB 11.2* 13.9  HCT 34.4* 41.0  MCV 84.3  --   PLT 231  --     Cardiac Enzymes:  Recent Labs Lab 12/21/15 0011 12/21/15 0523 12/21/15 1126  TROPONINI 0.04* 0.04* 0.14*    Coagulation:  Recent Labs Lab 12/21/15 0011  INR 1.34    Echocardiogram 12/21/2015: Study Conclusions  - Left ventricle: The cavity size was normal. There was mild  concentric hypertrophy. Systolic function was severely reduced.  The estimated ejection fraction was in the range of 20% to 25%.  Diffuse hypokinesis. Features are consistent  with a pseudonormal  left ventricular filling pattern, with concomitant abnormal  relaxation and increased filling pressure (grade 2 diastolic  dysfunction). Doppler parameters are consistent with elevated  ventricular end-diastolic filling pressure. - Ventricular septum: The contour showed diastolic flattening and  systolic flattening. - Mitral valve: Calcified annulus. Mildly thickened leaflets . The  findings are consistent with mild stenosis. There was moderate  regurgitation. - Right ventricle: Systolic function was moderately reduced. - Right atrium: The atrium was mildly dilated. - Tricuspid valve: Structurally normal valve. There was severe  regurgitation. - Pulmonic valve: There was trivial regurgitation. - Pulmonary arteries: Systolic pressure was severely increased. PA  peak pressure: 62 mm Hg (S). - Inferior vena cava: The vessel was dilated. The respirophasic  diameter changes were blunted (< 50%), consistent with elevated  central venous pressure. - Pericardium, extracardiac: A trivial pericardial effusion was  identified posterior to the heart. Features were not consistent  with tamponade physiology.   Medications:   Scheduled Medications: . amLODipine  5 mg Oral Daily  . aspirin  81 mg Oral Daily  . atorvastatin  40 mg Oral q1800  . carvedilol  6.25 mg Oral BID WC  . enoxaparin (LOVENOX) injection  30 mg Subcutaneous Q24H  . furosemide  60 mg Intravenous Q12H  . hydrALAZINE  25 mg  Oral 3 times per day  . insulin aspart  0-9 Units Subcutaneous TID WC  . insulin glargine  5 Units Subcutaneous QHS  . isosorbide mononitrate  60 mg Oral Daily  . loratadine  10 mg Oral Daily  . magnesium oxide  400 mg Oral BID  . pantoprazole  40 mg Oral Daily  . sodium chloride flush  3 mL Intravenous Q12H      PRN Medications:  sodium chloride, acetaminophen, albuterol, ondansetron (ZOFRAN) IV, polyethylene glycol, sodium chloride flush,  zolpidem   Assessment:   1. Acute on chronic systolic heart failure, symptomatically improving with IV diuresis. Follow-up echocardiogram shows LVEF 20-25% with grade 2 diastolic dysfunction, moderate mitral regurgitation, and severe tricuspid regurgitation with PASP 62 mmHg.  2. Valvular heart disease, moderate mitral and severe tricuspid regurgitation. To be managed conservatively.  3. 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  4. Minor increase in troponin I most consistent with heart failure and myocardial strain.   Plan/Discussion:    Current cardiac regimen includes aspirin, Norvasc, Coreg, Lipitor, hydralazine, and Imdur. Will continue IV Lasix at current dose with good diuresis and stable renal function.   Satira Sark, M.D., F.A.C.C.

## 2015-12-23 LAB — BASIC METABOLIC PANEL
ANION GAP: 10 (ref 5–15)
BUN: 35 mg/dL — AB (ref 6–20)
CHLORIDE: 103 mmol/L (ref 101–111)
CO2: 26 mmol/L (ref 22–32)
Calcium: 8.9 mg/dL (ref 8.9–10.3)
Creatinine, Ser: 2.09 mg/dL — ABNORMAL HIGH (ref 0.44–1.00)
GFR calc Af Amer: 25 mL/min — ABNORMAL LOW (ref >60)
GFR, EST NON AFRICAN AMERICAN: 21 mL/min — AB (ref >60)
GLUCOSE: 198 mg/dL — AB (ref 65–99)
POTASSIUM: 3.6 mmol/L (ref 3.5–5.1)
Sodium: 139 mmol/L (ref 135–145)

## 2015-12-23 LAB — GLUCOSE, CAPILLARY
Glucose-Capillary: 179 mg/dL — ABNORMAL HIGH (ref 65–99)
Glucose-Capillary: 246 mg/dL — ABNORMAL HIGH (ref 65–99)
Glucose-Capillary: 258 mg/dL — ABNORMAL HIGH (ref 65–99)
Glucose-Capillary: 188 mg/dL — ABNORMAL HIGH (ref 65–99)

## 2015-12-23 MED ORDER — TORSEMIDE 20 MG PO TABS
40.0000 mg | ORAL_TABLET | Freq: Every day | ORAL | Status: DC
  Administered 2015-12-23 – 2015-12-24 (×2): 40 mg via ORAL
  Filled 2015-12-23 (×2): qty 2

## 2015-12-23 NOTE — Progress Notes (Signed)
Primary cardiologist: Dr. Quay Burow  Seen for followup: Congestive heart failure  Subjective:    No chest pain or breathlessness at rest. States that she slept fairly well.  Objective:   Temp:  [98 F (36.7 C)-98.7 F (37.1 C)] 98 F (36.7 C) (03/12 0520) Pulse Rate:  [57-60] 59 (03/12 0520) Resp:  [16-18] 16 (03/12 0520) BP: (136-143)/(53-55) 143/53 mmHg (03/12 0520) SpO2:  [98 %-100 %] 100 % (03/12 0520) Weight:  [150 lb 1.6 oz (68.085 kg)] 150 lb 1.6 oz (68.085 kg) (03/12 0520) Last BM Date: 12/22/15  Filed Weights   12/21/15 0017 12/22/15 0412 12/23/15 0520  Weight: 159 lb 11.2 oz (72.439 kg) 163 lb (73.936 kg) 150 lb 1.6 oz (68.085 kg)    Intake/Output Summary (Last 24 hours) at 12/23/15 0923 Last data filed at 12/23/15 0149  Gross per 24 hour  Intake    680 ml  Output   2575 ml  Net  -1895 ml    Telemetry: Sinus rhythm with lead motion artifact.  Exam:  General: Frail-appearing elderly woman in no distress.  Lungs: Decreased breath sounds at the bases.  Cardiac: RRR with 2/6 systolic murmur at the apex.  Abdomen: NABS.  Extremities: Mild leg edema.  Lab Results:  Basic Metabolic Panel:  Recent Labs Lab 12/21/15 1225 12/22/15 0241 12/23/15 0618  NA 140 140 139  K 4.2 3.9 3.6  CL 109 108 103  CO2 22 23 26   GLUCOSE 147* 188* 198*  BUN 31* 35* 35*  CREATININE 1.93* 1.99* 2.09*  CALCIUM 9.4 9.1 8.9    CBC:  Recent Labs Lab 12/20/15 1701 12/20/15 1948  WBC 4.7  --   HGB 11.2* 13.9  HCT 34.4* 41.0  MCV 84.3  --   PLT 231  --     Cardiac Enzymes:  Recent Labs Lab 12/21/15 0011 12/21/15 0523 12/21/15 1126  TROPONINI 0.04* 0.04* 0.14*    Coagulation:  Recent Labs Lab 12/21/15 0011  INR 1.34    Echocardiogram 12/21/2015: Study Conclusions  - Left ventricle: The cavity size was normal. There was mild  concentric hypertrophy. Systolic function was severely reduced.  The estimated ejection fraction was in the  range of 20% to 25%.  Diffuse hypokinesis. Features are consistent with a pseudonormal  left ventricular filling pattern, with concomitant abnormal  relaxation and increased filling pressure (grade 2 diastolic  dysfunction). Doppler parameters are consistent with elevated  ventricular end-diastolic filling pressure. - Ventricular septum: The contour showed diastolic flattening and  systolic flattening. - Mitral valve: Calcified annulus. Mildly thickened leaflets . The  findings are consistent with mild stenosis. There was moderate  regurgitation. - Right ventricle: Systolic function was moderately reduced. - Right atrium: The atrium was mildly dilated. - Tricuspid valve: Structurally normal valve. There was severe  regurgitation. - Pulmonic valve: There was trivial regurgitation. - Pulmonary arteries: Systolic pressure was severely increased. PA  peak pressure: 62 mm Hg (S). - Inferior vena cava: The vessel was dilated. The respirophasic  diameter changes were blunted (< 50%), consistent with elevated  central venous pressure. - Pericardium, extracardiac: A trivial pericardial effusion was  identified posterior to the heart. Features were not consistent  with tamponade physiology.   Medications:   Scheduled Medications: . amLODipine  5 mg Oral Daily  . aspirin  81 mg Oral Daily  . atorvastatin  40 mg Oral q1800  . carvedilol  6.25 mg Oral BID WC  . enoxaparin (LOVENOX) injection  30 mg Subcutaneous  Q24H  . furosemide  60 mg Intravenous Q12H  . hydrALAZINE  25 mg Oral 3 times per day  . insulin aspart  0-9 Units Subcutaneous TID WC  . insulin glargine  5 Units Subcutaneous QHS  . isosorbide mononitrate  60 mg Oral Daily  . loratadine  10 mg Oral Daily  . magnesium oxide  400 mg Oral BID  . pantoprazole  40 mg Oral Daily  . sodium chloride flush  3 mL Intravenous Q12H    PRN Medications: sodium chloride, acetaminophen, albuterol, ondansetron (ZOFRAN) IV,  polyethylene glycol, sodium chloride flush, zolpidem   Assessment:   1. Acute on chronic systolic heart failure, symptomatically improved with IV diuresis. She has diuresed approximately 5.5 liters. Follow-up echocardiogram shows LVEF 20-25% with grade 2 diastolic dysfunction, moderate mitral regurgitation, and severe tricuspid regurgitation with PASP 62 mmHg.  2. Valvular heart disease, moderate mitral and severe tricuspid regurgitation. To be managed conservatively.  3. 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  4. Minor increase in troponin I most consistent with heart failure and myocardial strain.   Plan/Discussion:    Current cardiac regimen includes aspirin, Norvasc, Coreg, Lipitor, hydralazine, and Imdur. Will change from IV Lasix to Demadex at 40 mg daily. Observe urine output on this regimen and assess for need to titrate further.   Satira Sark, M.D., F.A.C.C.

## 2015-12-23 NOTE — Progress Notes (Signed)
PATIENT DETAILS Name: [name] Age: 82 y.o. Sex: female Date of Birth: [birth year] Admit Date: 12/20/2015 Admitting Physician Ivor Costa, MD [id number] Elyse Hsu, MD  Subjective: Improving with less shortness of breath and significant tenderness lower extremity edema.  Assessment/Plan: Principal Problem: Acute on chronic combined systolic and diastolic heart failure: Significantly improved with IV Lasix-now  -6.1 L so far, weight down to 150 pounds (159 pounds on admission). Now transitioned to oral Demadex-if weight/urine output remained stable-likely home on 3/13. Continue metoprolol, resume and Imdur. Suspect not on a ACE/ARB due to CKD. EF 20-25 % on echo this admission-also shows severe pulmonary hypertension which I suspect is contributing to peripheral edema.   Active Problems: Minimally elevated troponins: Trend is flat-not consistent with ACS, likely secondary to demand ischemia from CHF. Cardiology following.  Severe MR, TR and moderate PR: Previously not felt to be a surgical candidate.   Severe pulmonary hypertension: Seen on echo-PA pressure around 62 mmHg. Likely secondary to severe valvular abnormalities, chronic systolic heart failure. Managed conservatively.  Type 2 diabetes: CBG stable, continue 5 units of Lantus and SSI.  Dyslipidemia: Continue Lipitor  GERD: Continue PPI  CKD stage IV: Creatinine close to usual baseline, monitor closely while on diuretics.  Disposition: Remain inpatient  Antimicrobial agents  See below  Anti-infectives    None      DVT Prophylaxis: Prophylactic Lovenox   Code Status: Full code   Family Communication Son at bedside  Procedures: None  CONSULTS:  cardiology  Time spent 25 minutes-Greater than 50% of this time was spent in counseling, explanation of diagnosis, planning of further management, and coordination of care.  MEDICATIONS: Scheduled Meds: . amLODipine  5 mg Oral Daily  . aspirin   81 mg Oral Daily  . atorvastatin  40 mg Oral q1800  . carvedilol  6.25 mg Oral BID WC  . enoxaparin (LOVENOX) injection  30 mg Subcutaneous Q24H  . hydrALAZINE  25 mg Oral 3 times per day  . insulin aspart  0-9 Units Subcutaneous TID WC  . insulin glargine  5 Units Subcutaneous QHS  . isosorbide mononitrate  60 mg Oral Daily  . loratadine  10 mg Oral Daily  . magnesium oxide  400 mg Oral BID  . pantoprazole  40 mg Oral Daily  . sodium chloride flush  3 mL Intravenous Q12H  . torsemide  40 mg Oral Daily   Continuous Infusions:  PRN Meds:.sodium chloride, acetaminophen, albuterol, ondansetron (ZOFRAN) IV, polyethylene glycol, sodium chloride flush, zolpidem    PHYSICAL EXAM: Vital signs in last 24 hours: Filed Vitals:   12/22/15 1507 12/22/15 2050 12/23/15 0520 12/23/15 1053  BP: 143/55 136/53 143/53 132/58  Pulse: 57 60 59 58  Temp: 98.4 F (36.9 C) 98.7 F (37.1 C) 98 F (36.7 C) 98.6 F (37 C)  TempSrc: Oral Oral Oral Oral  Resp: 18 18 16 16   Weight:   68.085 kg (150 lb 1.6 oz)   SpO2: 98% 100% 100% 100%    Weight change: -5.851 kg (-12 lb 14.4 oz) Filed Weights   12/21/15 0017 12/22/15 0412 12/23/15 0520  Weight: 72.439 kg (159 lb 11.2 oz) 73.936 kg (163 lb) 68.085 kg (150 lb 1.6 oz)   Body mass index is 27.45 kg/(m^2).   Gen Exam: Awake and alert with clear speech.  Not in any distress. Neck: Supple   Chest: Few bibasilar rales CVS: S1 S2  Regular, no murmurs.  Abdomen: soft, BS +, non tender, non distended.  Extremities: + edema, lower extremities warm to touch. Neurologic: Non Focal.   Skin: No Rash.   Wounds: N/A.    Intake/Output from previous day:  Intake/Output Summary (Last 24 hours) at 12/23/15 1151 Last data filed at 12/23/15 1053  Gross per 24 hour  Intake    680 ml  Output   2275 ml  Net  -1595 ml     LAB RESULTS: CBC  Recent Labs Lab 12/20/15 1701 12/20/15 1948  WBC 4.7  --   HGB 11.2* 13.9  HCT 34.4* 41.0  PLT 231  --   MCV  84.3  --   MCH 27.5  --   MCHC 32.6  --   RDW 18.5*  --     Chemistries   Recent Labs Lab 12/20/15 1701 12/20/15 1948 12/21/15 0223 12/21/15 1225 12/22/15 0241 12/23/15 0618  NA 139 143 139 140 140 139  K 5.8* 4.2 5.9* 4.2 3.9 3.6  CL 108 106 108 109 108 103  CO2 21*  --  21* 22 23 26   GLUCOSE 171* 156* 180* 147* 188* 198*  BUN 32* 33* 31* 31* 35* 35*  CREATININE 2.10* 1.90* 2.02* 1.93* 1.99* 2.09*  CALCIUM 9.3  --  9.4 9.4 9.1 8.9    CBG:  Recent Labs Lab 12/22/15 1618 12/22/15 1717 12/22/15 2104 12/23/15 0607 12/23/15 1148  GLUCAP 188* 163* 200* 179* 246*    GFR Estimated Creatinine Clearance: 19.4 mL/min (by C-G formula based on Cr of 2.09).  Coagulation profile  Recent Labs Lab 12/21/15 0011  INR 1.34    Cardiac Enzymes  Recent Labs Lab 12/21/15 0011 12/21/15 0523 12/21/15 1126  TROPONINI 0.04* 0.04* 0.14*    Invalid input(s): POCBNP No results for input(s): DDIMER in the last 72 hours. No results for input(s): HGBA1C in the last 72 hours. No results for input(s): CHOL, HDL, LDLCALC, TRIG, CHOLHDL, LDLDIRECT in the last 72 hours. No results for input(s): TSH, T4TOTAL, T3FREE, THYROIDAB in the last 72 hours.  Invalid input(s): FREET3 No results for input(s): VITAMINB12, FOLATE, FERRITIN, TIBC, IRON, RETICCTPCT in the last 72 hours.  Recent Labs  12/21/15 0011  LIPASE 22    Urine Studies No results for input(s): UHGB, CRYS in the last 72 hours.  Invalid input(s): UACOL, UAPR, USPG, UPH, UTP, UGL, UKET, UBIL, UNIT, UROB, ULEU, UEPI, UWBC, URBC, UBAC, CAST, UCOM, BILUA  MICROBIOLOGY: Recent Results (from the past 240 hour(s))  Urine culture     Status: None   Collection Time: 12/21/15  2:37 AM  Result Value Ref Range Status   Specimen Description URINE, CATHETERIZED  Final   Special Requests NONE  Final   Culture NO GROWTH 1 DAY  Final   Report Status 12/22/2015 FINAL  Final    RADIOLOGY STUDIES/RESULTS: Dg Chest 2  View  12/20/2015  CLINICAL DATA:  For 3 years patient has been severely SOB. Patient states she can not remember a lot. HX CHF, CAD, HTN, Diabetes, myocardial infarction, pneumonia, stroke 2011, cardiac catheterization EXAM: CHEST  2 VIEW COMPARISON:  11/04/2015 FINDINGS: Heart size is markedly enlarged but stable in appearance. Interstitial edema again noted. Small right pleural effusion. There are no focal consolidations. Visualized osseous structures have a normal appearance. IMPRESSION: Stable marked enlargement of the heart/pericardial silhouette. CHF/interstitial edema and small right effusion. Electronically Signed   By: Nolon Nations M.D.   On: 12/20/2015 17:55    Oren Binet, MD  [facility] Pager:[phone]  If 7PM-7AM, please contact night-coverage [email or web address] Password [facility] 12/23/2015, 11:51 AM   LOS: 3 days

## 2015-12-24 ENCOUNTER — Telehealth: Payer: Self-pay | Admitting: Cardiovascular Disease

## 2015-12-24 DIAGNOSIS — I5023 Acute on chronic systolic (congestive) heart failure: Secondary | ICD-10-CM

## 2015-12-24 LAB — BASIC METABOLIC PANEL
ANION GAP: 9 (ref 5–15)
BUN: 35 mg/dL — AB (ref 6–20)
CHLORIDE: 103 mmol/L (ref 101–111)
CO2: 27 mmol/L (ref 22–32)
Calcium: 8.8 mg/dL — ABNORMAL LOW (ref 8.9–10.3)
Creatinine, Ser: 2.05 mg/dL — ABNORMAL HIGH (ref 0.44–1.00)
GFR, EST AFRICAN AMERICAN: 25 mL/min — AB (ref >60)
GFR, EST NON AFRICAN AMERICAN: 22 mL/min — AB (ref >60)
Glucose, Bld: 204 mg/dL — ABNORMAL HIGH (ref 65–99)
POTASSIUM: 3.6 mmol/L (ref 3.5–5.1)
SODIUM: 139 mmol/L (ref 135–145)

## 2015-12-24 LAB — GLUCOSE, CAPILLARY
GLUCOSE-CAPILLARY: 150 mg/dL — AB (ref 65–99)
GLUCOSE-CAPILLARY: 167 mg/dL — AB (ref 65–99)

## 2015-12-24 MED ORDER — HYDRALAZINE HCL 25 MG PO TABS: 25.0000 mg | ORAL_TABLET | Freq: Three times a day (TID) | ORAL | Status: AC

## 2015-12-24 MED ORDER — TORSEMIDE 20 MG PO TABS: 40.0000 mg | ORAL_TABLET | Freq: Every day | ORAL | Status: DC

## 2015-12-24 NOTE — Progress Notes (Signed)
Physical Therapy Treatment Patient Details Name: [name] MRN: [id number] DOB: 25-Apr-1933 Today's Date: 12/24/2015    History of Present Illness 82 y.o. female with PMH of chronic combined systolic and diastolic congestive heart failure with an EF 30-35%, hyperlipidemia, diabetes mellitus, GERD, embolic stroke, GI bleeding, uterine cancer, CAD, MI, chronic kidney disease-stage IV, tricuspid valve regurgitation, PAF (not on anticoagulants possibly due to history of GI bleeding), who presents with a shortness present worsening leg edema.    PT Comments    Patient progressing well towards PT goals. Tolerated gait training with min guard assist for safety due to bil knee instability and shakiness, but no buckling. Demonstrates decreased endurance and fatigues quickly requiring seated rest break. Encouraged increased activity. Will continue to follow per current POC.  Follow Up Recommendations  Home health PT;Supervision/Assistance - 24 hour     Equipment Recommendations  Hospital bed    Recommendations for Other Services       Precautions / Restrictions Precautions Precautions: Fall Restrictions Weight Bearing Restrictions: No    Mobility  Bed Mobility Overal bed mobility: Needs Assistance Bed Mobility: Supine to Sit     Supine to sit: Min assist     General bed mobility comments: Min A to elevate trunk.   Transfers Overall transfer level: Needs assistance Equipment used: Rolling walker (2 wheeled) Transfers: Sit to/from Stand Sit to Stand: Min guard         General transfer comment: Min guard for safety. Cues for hand placement/technique. Able to scoot to EOB without assist. Stood from EOB x1, from chair x1. transferred to chair post ambulation bout.   Ambulation/Gait Ambulation/Gait assistance: Min guard Ambulation Distance (Feet): 25 Feet (+ 45') Assistive device: Rolling walker (2 wheeled) Gait Pattern/deviations: Step-through pattern;Decreased stride  length;Trunk flexed Gait velocity: decreased Gait velocity interpretation: <1.8 ft/sec, indicative of risk for recurrent falls General Gait Details: Slow, unsteady gait with bil knee instability noted. Fatigues. 1 seated rest break. HR stable 59-81 bpm.   Stairs            Wheelchair Mobility    Modified Rankin (Stroke Patients Only)       Balance Overall balance assessment: Needs assistance Sitting-balance support: Feet supported;No upper extremity supported Sitting balance-Leahy Scale: Good     Standing balance support: During functional activity;Bilateral upper extremity supported Standing balance-Leahy Scale: Poor Standing balance comment: Reilant on UEs for support. Knees shaky.                    Cognition Arousal/Alertness: Awake/alert Behavior During Therapy: WFL for tasks assessed/performed Overall Cognitive Status: No family/caregiver present to determine baseline cognitive functioning Area of Impairment:  (A&O x3.)                    Exercises      General Comments        Pertinent Vitals/Pain Pain Assessment: No/denies pain    Home Living                      Prior Function            PT Goals (current goals can now be found in the care plan section) Progress towards PT goals: Progressing toward goals    Frequency  Min 3X/week    PT Plan Current plan remains appropriate    Co-evaluation             End of Session Equipment Utilized During Treatment: Gait belt  Activity Tolerance: Patient tolerated treatment well Patient left: in chair;with call bell/phone within reach;with chair alarm set     Time: 0937-1000 PT Time Calculation (min) (ACUTE ONLY): 23 min  Charges:  $Gait Training: 23-37 mins                    G Codes:      [name] A Raelee Rossmann 12/24/2015, 10:15 AM  Wray Kearns, [place], DPT [phone]

## 2015-12-24 NOTE — Telephone Encounter (Signed)
TOC - still admitted 3/13.

## 2015-12-24 NOTE — Telephone Encounter (Signed)
New message     TOC appt on  3.20.2017  With Truitt Merle per Tarri Fuller

## 2015-12-24 NOTE — Care Management Note (Signed)
Case Management Note Marvetta Gibbons RN, BSN Unit 2W-Case Manager [phone]  Patient Details  Name: [name] MRN: [id number] Date of Birth: [birth year]  Subjective/Objective:   Pt admitted with acute on chronic CHF                 Action/Plan: PTA pt lived at home with son [name] -active with [place]- for disease management- resumption orders placed for RN/PT- pt has RW at home- call placed to [facility] with [facility] for [facility] resumption- pt for discharge today- no further CM needs noted  Expected Discharge Date:   12/24/15               Expected Discharge Plan:  Home/home health  In-House Referral:     Discharge planning Services  CM Consult  Post Acute Care Choice:  Home Health, Resumption of Svcs/PTA Provider Choice offered to:     DME Arranged:    DME Agency:     HH Arranged:  RN, PT [place] Agency:  [facility]  Status of Service:  Completed, signed off  Medicare Important Message Given:  Yes Date Medicare IM Given:    Medicare IM give by:    Date Additional Medicare IM Given:    Additional Medicare Important Message give by:     If discussed at [place] of Stay Meetings, dates discussed:    Discharge Disposition: Home/home health   Additional Comments:  Dawayne Patricia, RN 12/24/2015, 1:56 PM

## 2015-12-24 NOTE — Progress Notes (Signed)
Occupational Therapy Treatment Patient Details Name: [name] MRN: [id number] DOB: 10-Jul-1933 Today's Date: 12/24/2015    History of present illness 82 y.o. female with PMH of chronic combined systolic and diastolic congestive heart failure with an EF 30-35%, hyperlipidemia, diabetes mellitus, GERD, embolic stroke, GI bleeding, uterine cancer, CAD, MI, chronic kidney disease-stage IV, tricuspid valve regurgitation, PAF (not on anticoagulants possibly due to history of GI bleeding), who presents with a shortness present worsening leg edema.   OT comments  Patient progressing nicely towards Supervision. No original OT goals set, this therapist set goals for overall supervision level. Pt has son and granddaughter to assist at home post acute d/c.    Follow Up Recommendations  Home health OT;Supervision/Assistance - 24 hour    Equipment Recommendations  None recommended by OT    Recommendations for Other Services  None at this time  Precautions / Restrictions Precautions Precautions: Fall Restrictions Weight Bearing Restrictions: No    Mobility Bed Mobility Overal bed mobility: Needs Assistance Bed Mobility: Supine to Sit     Supine to sit: Min assist     General bed mobility comments: Pt found seated in recliner upon OT entering/exiting room  Transfers Overall transfer level: Needs assistance Equipment used: Rolling walker (2 wheeled) Transfers: Sit to/from Stand Sit to Stand: Min guard;Min assist General transfer comment: Min guard/assist needed for safety, cues for hand placement, technique, and safety     Balance Overall balance assessment: Needs assistance Sitting-balance support: No upper extremity supported;Feet supported Sitting balance-Leahy Scale: Good     Standing balance support: No upper extremity supported;During functional activity Standing balance-Leahy Scale: Fair Standing balance comment: Pt stood at sink to wash hands.    ADL Overall ADL's :  Needs assistance/impaired Eating/Feeding: Set up;Sitting   Grooming: Wash/dry hands;Set up;Standing;Min guard   Upper Body Bathing: Set up;Sitting   Lower Body Bathing: Minimal assistance;Sit to/from stand   Upper Body Dressing : Set up;Sitting   Lower Body Dressing: Minimal assistance;Sit to/from stand   Toilet Transfer: RW;Minimal assistance;Ambulation;Grab bars;Comfort height toilet   Toileting- Clothing Manipulation and Hygiene: Min guard;Sit to/from Nurse, children's Details (indicate cue type and reason): Discussed with son, per his report patient's granddaughter assists pt in/out of tub with shower seat Functional mobility during ADLs: Rolling walker;Minimal assistance General ADL Comments: Pt found seated in recliner with son present. Pt stood from recliner and ambulated into BR using RW. Pt performed toilet transfer with min assist using RW, grab bars, and comfort height toilet seat. Pt stood at sink for grooming tasks of washing hands with min assist. Pt with increased muscle fatigue as shown by shakiness of muscles, but no buckling of knees during functional ambulation.      Vision Additional Comments: no change from baseline          Cognition   Behavior During Therapy: WFL for tasks assessed/performed Overall Cognitive Status: Difficult to assess Area of Impairment:  (A&O x3.)                 Pertinent Vitals/ Pain       Pain Assessment: No/denies pain   Frequency Min 2X/week     Progress Toward Goals  OT Goals(current goals can now befound in the care plan section)  Progress towards OT goals:  (No goals set during eval. This OT set overall supervision goals. )  Acute Rehab OT Goals Patient Stated Goal: go home soon OT Goal Formulation: With patient/family Time For Goal  Achievement: 01/04/16 Potential to Achieve Goals: Good ADL Goals Pt Will Perform Grooming: with supervision;standing Pt Will Perform Lower Body Bathing: with  supervision;sit to/from stand Pt Will Perform Lower Body Dressing: with supervision;sit to/from stand Pt Will Transfer to Toilet: with supervision;bedside commode;ambulating Additional ADL Goal #1: Pt will be supervision for functional ambulation during ADL  Plan Discharge plan remains appropriate    End of Session Equipment Utilized During Treatment: Rolling walker;Gait belt   Activity Tolerance Patient tolerated treatment well   Patient Left in chair;with call bell/phone within reach;with chair alarm set;with family/visitor present    Time: [id number] OT Time Calculation (min): 15 min  Charges: OT General Charges $OT Visit: 1 Procedure OT Treatments $Self Care/Home Management : 8-22 mins  Chrys Racer , MS, OTR/L, CLT Pager: [phone]  12/24/2015, 1:39 PM

## 2015-12-24 NOTE — Telephone Encounter (Signed)
lmctb

## 2015-12-24 NOTE — Care Management Important Message (Signed)
Important Message  Patient Details  Name: [name] MRN: [id number] Date of Birth: March 17, 1933   Medicare Important Message Given:  Yes    Nathen May 12/24/2015, 9:52 AM

## 2015-12-24 NOTE — Progress Notes (Signed)
Subjective: She reports feeling "a lot better".  No SOB  Objective: Vital signs in last 24 hours: Temp:  [98.4 F (36.9 C)-99 F (37.2 C)] 98.5 F (36.9 C) (03/13 0428) Pulse Rate:  [58-60] 60 (03/13 0428) Resp:  [16-18] 18 (03/13 0428) BP: (120-141)/(40-63) 132/57 mmHg (03/13 0428) SpO2:  [99 %-100 %] 100 % (03/13 0428) Weight:  [156 lb 14.4 oz (71.169 kg)] 156 lb 14.4 oz (71.169 kg) (03/13 0428) Last BM Date: 12/23/15  Intake/Output from previous day: 03/12 0701 - 03/13 0700 In: -  Out: 1800 [Urine:1800] Intake/Output this shift: Total I/O In: 240 [P.O.:240] Out: -   Medications Scheduled Meds: . amLODipine  5 mg Oral Daily  . aspirin  81 mg Oral Daily  . atorvastatin  40 mg Oral q1800  . carvedilol  6.25 mg Oral BID WC  . enoxaparin (LOVENOX) injection  30 mg Subcutaneous Q24H  . hydrALAZINE  25 mg Oral 3 times per day  . insulin aspart  0-9 Units Subcutaneous TID WC  . insulin glargine  5 Units Subcutaneous QHS  . isosorbide mononitrate  60 mg Oral Daily  . loratadine  10 mg Oral Daily  . magnesium oxide  400 mg Oral BID  . pantoprazole  40 mg Oral Daily  . sodium chloride flush  3 mL Intravenous Q12H  . torsemide  40 mg Oral Daily   Continuous Infusions:  PRN Meds:.sodium chloride, acetaminophen, albuterol, ondansetron (ZOFRAN) IV, polyethylene glycol, sodium chloride flush, zolpidem  PE: General appearance: alert, cooperative and no distress Neck: no JVD Lungs: clear to auscultation bilaterally Heart: regular rate and rhythm and 2/6 sys MM LSB Abdomen: BS, nontender, soft Extremities: No LEE Pulses: 2+ radials 1 dps Skin: warm and dry Neurologic: Grossly normal  Lab Results:  No results for input(s): WBC, HGB, HCT, PLT in the last 72 hours. BMET  Recent Labs  12/22/15 0241 12/23/15 0618 12/24/15 0227  NA 140 139 139  K 3.9 3.6 3.6  CL 108 103 103  CO2 23 26 27   GLUCOSE 188* 198* 204*  BUN 35* 35* 35*  CREATININE 1.99* 2.09* 2.05*    CALCIUM 9.1 8.9 8.8*    Assessment/Plan  Principal Problem:   Acute on chronic combined systolic and diastolic heart failure (HCC) Active Problems:   DM (diabetes mellitus), type 2, uncontrolled (HCC)   Hyperlipidemia   Iron deficiency anemia secondary to blood loss (chronic)   Essential hypertension   GERD   CAD (coronary artery disease)   CVA (cerebral infarction)   CKD (chronic kidney disease) stage 4, GFR 15-29 ml/min (HCC)   Paroxysmal atrial fibrillation (HCC)   Severe tricuspid regurgitation   CHF exacerbation (HCC)   Suprapubic abdominal pain  1. Acute on chronic systolic heart failure,  Net fluids: -1.8L/-7.4L.  Follow-up echocardiogram shows LVEF 20-25% with grade 2 diastolic dysfunction, moderate mitral regurgitation, and severe tricuspid regurgitation with PASP 62 mmHg. She looks euvolemic.  On torsemide 40 PO daily.  No changes.    Will arrange TOC follow up with APP in 7-10 days and Dr. Gwenlyn Found in 8 weeks   2. Valvular heart disease, moderate mitral and severe tricuspid regurgitation. To be managed conservatively.  3. CKD stage 3 to 4, recent creatinine 1.9 >> 2.09 >>2.05.  4. Troponin elevation-0.14:    Minor increase in troponin consistent with heart failure and myocardial strain.    LOS: 4 days    HAGER, BRYAN PA-C 12/24/2015 9:33 AM   Agree with note written  by Luisa Dago PAC   Good diuresis > 7 L. Exam benign. Labs stable. Renal Fxn stable.  Severe LV dysf. TOC 7 then ROV with me 6-8 weeks. OK for DC home.  Quay Burow 12/24/2015 11:14 AM

## 2015-12-24 NOTE — Discharge Summary (Signed)
PATIENT DETAILS Name: [name] Age: 82 y.o. Sex: female Date of Birth: 1932-12-02 MRN: [id number]. Admitting Physician: Ivor Costa, MD [id number] Elyse Hsu, MD  Admit Date: 12/20/2015 Discharge date: 12/24/2015  Recommendations for Outpatient Follow-up:  1. Repeat BMET at next visit. 2. May need to involve palliative care in the near future  PRIMARY DISCHARGE DIAGNOSIS:  Principal Problem:   Acute on chronic combined systolic and diastolic heart failure (HCC) Active Problems:   DM (diabetes mellitus), type 2, uncontrolled (HCC)   Hyperlipidemia   Iron deficiency anemia secondary to blood loss (chronic)   Essential hypertension   GERD   CAD (coronary artery disease)   CVA (cerebral infarction)   CKD (chronic kidney disease) stage 4, GFR 15-29 ml/min (HCC)   Paroxysmal atrial fibrillation (HCC)   Severe tricuspid regurgitation   CHF exacerbation (HCC)   Suprapubic abdominal pain      PAST MEDICAL HISTORY: Past Medical History  Diagnosis Date  . Asthma   . CAD (coronary artery disease)     Pt reports MI in 2006 (no documentation).  Cardiolite in 05/2002 and 07/2006 did not reveal any reversible ischemia.  Pt follows with Dr. Rex Kras at [facility].  . CHF (congestive heart failure) (HCC)     EF 25-30% with dilated LV, mild LVH, severe hypokinesis, and mod-severe reduction in RV function  . Osteoporosis   . HYPERTENSION 08/03/2006  . GASTROPARESIS, DIABETIC 08/03/2006  . HYPERLIPIDEMIA 08/03/2006  . OBSTRUCTIVE SLEEP APNEA 01/06/2008  . PERIPHERAL NEUROPATHY 08/03/2006  . GERD 08/03/2006  . LOW BACK PAIN, CHRONIC 08/03/2006  . OSTEOPOROSIS 03/21/2009  . CEREBRAL EMBOLISM, WITH INFARCTION 07/02/2010  . Angina   . Myocardial infarction ([place]) "2 or 3"  . Pneumonia 02/26/12    "a few times; probably even today"  . DIABETES MELLITUS, TYPE II 11/04/1983  . Blood transfusion     multiple in 2012, 2015.   . GI bleed 08/2011 and 01/2014  . Chronic daily headache     migraines as  well.   . Stroke [facility]) summer 2011    "made my left hip worse"  . Uterine cancer ([place])   . Nonischemic cardiomyopathy ([place]) 05/2010    Left heart catheterization:2011. Nonobstructive coronary artery disease.  . Pulmonary hypertension ([place])   . NSTEMI (non-ST elevated myocardial infarction) ([place]) 01/2014    type II with CVA  . Acute embolic stroke ([place]) 0000000  . Non-ST elevation MI (NSTEMI) ([place]) 02/04/2014  . E. coli UTI 03/02/2014  . Renal failure, acute ([place]) 02/04/2014  . Iron deficiency anemia secondary to blood loss (chronic) 01/06/2008    Qualifier: Diagnosis of  By: Tomasa Hosteller MD, Veronique D.   . Nonsustained ventricular tachycardia ([place])   . Angiodysplasia of intestine with hemorrhage 05/13/2014  . CKD (chronic kidney disease) stage 4, GFR 15-29 ml/min (HCC) 08/21/2014    DISCHARGE MEDICATIONS: Current Discharge Medication List    CONTINUE these medications which have CHANGED   Details  hydrALAZINE (APRESOLINE) 25 MG tablet Take 1 tablet (25 mg total) by mouth every 8 (eight) hours. Qty: 90 tablet, Refills: 0    torsemide (DEMADEX) 20 MG tablet Take 2 tablets (40 mg total) by mouth daily. Qty: 60 tablet, Refills: 0      CONTINUE these medications which have NOT CHANGED   Details  acetaminophen (TYLENOL) 500 MG tablet Take 500 mg by mouth every 6 (six) hours as needed for moderate pain or headache.    albuterol (PROAIR HFA) 108 (90 BASE) MCG/ACT inhaler  Inhale 2 puffs into the lungs every 6 (six) hours as needed for wheezing. Qty: 8.5 g, Refills: 11    amLODipine (NORVASC) 5 MG tablet TAKE 1 TABLET EVERY DAY Qty: 30 tablet, Refills: 11    aspirin 81 MG tablet Take 81 mg by mouth daily.    atorvastatin (LIPITOR) 40 MG tablet TAKE 1 TABLET (40 MG TOTAL) BY MOUTH DAILY AT 6 PM. Qty: 30 tablet, Refills: 10    insulin glargine (LANTUS) 100 UNIT/ML injection Inject 0.07 mLs (7 Units total) into the skin at bedtime. Qty: 10 mL, Refills: 11    isosorbide mononitrate (IMDUR)  60 MG 24 hr tablet Take 1 tablet (60 mg total) by mouth daily. Qty: 30 tablet, Refills: 2    KLOR-CON 10 10 MEQ tablet Take 20 mEq by mouth daily.     loratadine (ALLERGY RELIEF) 10 MG tablet Take 10 mg by mouth daily. Reported on 12/07/2015    magnesium oxide (MAG-OX) 400 (241.3 MG) MG tablet Take 1 tablet (400 mg total) by mouth 2 (two) times daily. Qty: 60 tablet, Refills: 1    omeprazole (PRILOSEC) 20 MG capsule Take 1 capsule (20 mg total) by mouth daily before breakfast. Qty: 30 capsule, Refills: 11   Associated Diagnoses: Erosive gastritis with hemorrhage    polyethylene glycol (MIRALAX / GLYCOLAX) packet Take 17 g by mouth daily as needed for mild constipation.       STOP taking these medications     metoprolol succinate (TOPROL-XL) 25 MG 24 hr tablet         ALLERGIES:   Allergies  Allergen Reactions  . Baking Soda-Fluoride [Sodium Fluoride] Nausea And Vomiting  . Magnesium Hydroxide Nausea And Vomiting    BRIEF HPI:  See H&P, Labs, Consult and Test reports for all details in brief, patient is a 82 y.o. female with PMH of chronic combined systolic and diastolic congestive heart failure with an EF 30-35%, hyperlipidemia, diabetes mellitus, GERD, embolic stroke, GI bleeding, uterine cancer, CAD, MI, chronic kidney disease-stage IV, tricuspid valve regurgitation, PAF (not on anticoagulants possibly due to history of GI bleeding), who presented with shortness present worsening leg edema.Found to have decompensated CHF.  CONSULTATIONS:   cardiology  PERTINENT RADIOLOGIC STUDIES: Dg Chest 2 View  12/20/2015  CLINICAL DATA:  For 3 years patient has been severely SOB. Patient states she can not remember a lot. HX CHF, CAD, HTN, Diabetes, myocardial infarction, pneumonia, stroke 2011, cardiac catheterization EXAM: CHEST  2 VIEW COMPARISON:  11/04/2015 FINDINGS: Heart size is markedly enlarged but stable in appearance. Interstitial edema again noted. Small right pleural effusion.  There are no focal consolidations. Visualized osseous structures have a normal appearance. IMPRESSION: Stable marked enlargement of the heart/pericardial silhouette. CHF/interstitial edema and small right effusion. Electronically Signed   By: Nolon Nations M.D.   On: 12/20/2015 17:55     PERTINENT LAB RESULTS: CBC: No results for input(s): WBC, HGB, HCT, PLT in the last 72 hours. CMET CMP     Component Value Date/Time   NA 139 12/24/2015 0227   NA 140 07/26/2015 0758   K 3.6 12/24/2015 0227   K 3.9 07/26/2015 0758   CL 103 12/24/2015 0227   CO2 27 12/24/2015 0227   CO2 26 07/26/2015 0758   GLUCOSE 204* 12/24/2015 0227   GLUCOSE 172* 07/26/2015 0758   BUN 35* 12/24/2015 0227   BUN 31.4* 07/26/2015 0758   CREATININE 2.05* 12/24/2015 0227   CREATININE 2.0* 07/26/2015 0758   CREATININE 1.77* 09/01/2014  1549   CALCIUM 8.8* 12/24/2015 0227   CALCIUM 9.2 07/26/2015 0758   PROT 6.2* 11/07/2015 0556   PROT 6.5 07/26/2015 0758   ALBUMIN 2.9* 11/07/2015 0556   ALBUMIN 3.0* 07/26/2015 0758   AST 18 11/07/2015 0556   AST 23 07/26/2015 0758   ALT 20 11/07/2015 0556   ALT 34 07/26/2015 0758   ALKPHOS 189* 11/07/2015 0556   ALKPHOS 201* 07/26/2015 0758   BILITOT 0.8 11/07/2015 0556   BILITOT 0.38 07/26/2015 0758   GFRNONAA 22* 12/24/2015 0227   GFRNONAA 39* 09/03/2011 1404   GFRAA 25* 12/24/2015 0227   GFRAA 45* 09/03/2011 1404    GFR Estimated Creatinine Clearance: 20.2 mL/min (by C-G formula based on Cr of 2.05). No results for input(s): LIPASE, AMYLASE in the last 72 hours.  Recent Labs  12/21/15 1126  TROPONINI 0.14*   Invalid input(s): POCBNP No results for input(s): DDIMER in the last 72 hours. No results for input(s): HGBA1C in the last 72 hours. No results for input(s): CHOL, HDL, LDLCALC, TRIG, CHOLHDL, LDLDIRECT in the last 72 hours. No results for input(s): TSH, T4TOTAL, T3FREE, THYROIDAB in the last 72 hours.  Invalid input(s): FREET3 No results for  input(s): VITAMINB12, FOLATE, FERRITIN, TIBC, IRON, RETICCTPCT in the last 72 hours. Coags: No results for input(s): INR in the last 72 hours.  Invalid input(s): PT Microbiology: Recent Results (from the past 240 hour(s))  Urine culture     Status: None   Collection Time: 12/21/15  2:37 AM  Result Value Ref Range Status   Specimen Description URINE, CATHETERIZED  Final   Special Requests NONE  Final   Culture NO GROWTH 1 DAY  Final   Report Status 12/22/2015 FINAL  Final     BRIEF HOSPITAL COURSE:  Acute on chronic combined systolic and diastolic heart failure: Significantly improved with IV Lasix,-now -7.1 L so far. Has been transitioned to oral Demadex.  Continue coreg, Hydralazine and Imdur. Suspect not on a ACE/ARB due to CKD. EF 20-25 % on echo this admission-also shows severe pulmonary hypertension which I suspect is contributing to peripheral edema. Spoke with cardiology-ok to discharge, follow up appointments have been arranged  Active Problems: Minimally elevated troponins: Trend is flat-not consistent with ACS, likely secondary to demand ischemia from CHF. Cardiology does not plan on any further investigations. .  Severe MR, TR and moderate PR: Previously not felt to be a surgical candidate. Plan is to manage conservatively  Severe pulmonary hypertension: Seen on echo-PA pressure around 62 mmHg. Likely secondary to severe valvular abnormalities, chronic systolic heart failure. Managed conservatively.  Type 2 diabetes: CBG stable, continue usual dosing of Lantus  Dyslipidemia: Continue Lipitor  GERD: Continue PPI  CKD stage IV: Creatinine close to usual baseline, monitor closely while on diuretics.   TODAY-DAY OF DISCHARGE:  Subjective:   [name] today has no headache,no chest abdominal pain,no new weakness tingling or numbness, feels much better wants to go home today.   Objective:   Blood pressure 150/65, pulse 60, temperature 98.5 F (36.9 C), temperature  source Oral, resp. rate 18, height 5\' 2"  (1.575 m), weight 71.169 kg (156 lb 14.4 oz), last menstrual period 12/19/1968, SpO2 100 %.  Intake/Output Summary (Last 24 hours) at 12/24/15 1117 Last data filed at 12/24/15 0900  Gross per 24 hour  Intake    240 ml  Output   1200 ml  Net   -960 ml   Filed Weights   12/22/15 0412 12/23/15 0520 12/24/15 0428  Weight: 73.936 kg (163 lb) 68.085 kg (150 lb 1.6 oz) 71.169 kg (156 lb 14.4 oz)    Exam Awake Alert, Oriented *3, No new F.N deficits, Normal affect Eugenio Saenz.AT,PERRAL Supple Neck,No JVD, No cervical lymphadenopathy appriciated.  Symmetrical Chest wall movement, Good air movement bilaterally, CTAB RRR,No Gallops,Rubs or new Murmurs, No Parasternal Heave +ve B.Sounds, Abd Soft, Non tender, No organomegaly appriciated, No rebound -guarding or rigidity. No Cyanosis, Clubbing or edema, No new Rash or bruise  DISCHARGE CONDITION: Stable  DISPOSITION: Home with home health services  DISCHARGE INSTRUCTIONS:    Activity:  As tolerated with Full fall precautions use walker/cane & assistance as needed  Get Medicines reviewed and adjusted: Please take all your medications with you for your next visit with your Primary MD  Please request your Primary MD to go over all hospital tests and procedure/radiological results at the follow up, please ask your Primary MD to get all Hospital records sent to his/her office.  If you experience worsening of your admission symptoms, develop shortness of breath, life threatening emergency, suicidal or homicidal thoughts you must seek medical attention immediately by calling 911 or calling your MD immediately  if symptoms less severe.  You must read complete instructions/literature along with all the possible adverse reactions/side effects for all the Medicines you take and that have been prescribed to you. Take any new Medicines after you have completely understood and accpet all the possible adverse  reactions/side effects.   Do not drive when taking Pain medications.   Do not take more than prescribed Pain, Sleep and Anxiety Medications  Special Instructions: If you have smoked or chewed Tobacco  in the last 2 yrs please stop smoking, stop any regular Alcohol  and or any Recreational drug use.  Wear Seat belts while driving.  Please note  You were cared for by a hospitalist during your hospital stay. Once you are discharged, your primary care physician will handle any further medical issues. Please note that NO REFILLS for any discharge medications will be authorized once you are discharged, as it is imperative that you return to your primary care physician (or establish a relationship with a primary care physician if you do not have one) for your aftercare needs so that they can reassess your need for medications and monitor your lab values.   Diet recommendation: Diabetic Diet Heart Healthy diet  Discharge Instructions    Call MD for:  difficulty breathing, headache or visual disturbances    Complete by:  As directed      Call MD for:  extreme fatigue    Complete by:  As directed      Diet - low sodium heart healthy    Complete by:  As directed      Diet Carb Modified    Complete by:  As directed      Increase activity slowly    Complete by:  As directed            Follow-up Information    Follow up with Truitt Merle, NP On 12/31/2015.   Specialties:  Nurse Practitioner, Interventional Cardiology, Cardiology, Radiology   Why:  8:00 AM   Contact information:   [place] [place] [address] [phone]       Follow up with Quay Burow, MD On 02/19/2016.   Specialties:  Cardiology, Radiology   Why:  9:30 AM   Contact information:   [address] [place] [address] [phone]  Follow up with [name], MD. Schedule an appointment as soon as possible for a visit in 1 week.   Specialty:  Family Medicine   Why:   Hospital follow up, Repeat electrolytes   Contact information:   [address] [place]  [address] [phone]      Total Time spent on discharge equals 45 minutes.  SignedOren Binet 12/24/2015 11:17 AM

## 2015-12-24 NOTE — Progress Notes (Signed)
Pt. Discharged to home with son  Pt. D/C'd via wheelchair with NT Discharge information reviewed and given All personal belongings given to Pt.  Education discussed and reviewed with teach-back  IV was d/c Tele d/c

## 2015-12-24 NOTE — Telephone Encounter (Signed)
LMTCB

## 2015-12-24 NOTE — Consult Note (Signed)
   [facility] CM Inpatient Consult   12/24/2015  [name] 1933/02/25 [id number]   Made aware of patient admission from [facility]. Patient had been active with [place] [facility]. However, patient and son declined further follow up. Please see chart review tab then notes in [facility] for further [facility] patient outreach information.  Went to bedside to speak with patient and son, Ronalee Belts, about resuming [facility] Care Management services. Ronalee Belts states "we do not need it, she (patient) already has [facility] and their nurse does what you do". He reports patient weighs on a telemonitor daily as well. Explained that patient could benefit from both services due to recent hospitalization. Both patient and son still decline. Accepted contact information to call if they change their mind. Made inpatient RNCM aware patient and son declined [facility]. Confirmed with inpatient RNCM that patient has home health provided by [facility] for PT/RN services. Will make [facility] RNCM aware that patient and son continue to decline [facility] Care Management program.   Marthenia Rolling, MSN-Ed, RN,BSN [facility] Liaison [phone]

## 2015-12-26 NOTE — Telephone Encounter (Signed)
Unable to leave v/m phone disconnected; attempted other number [email or web address]

## 2015-12-27 ENCOUNTER — Other Ambulatory Visit: Payer: Self-pay

## 2015-12-27 NOTE — Patient Outreach (Addendum)
[facility]) Care Management  12/27/2015  CHASIDEE WELSH 12-11-1932 [id number]   Telephone Screen  Referral Date: 3/161/17 Referral Source: Nurse Call Center Referral Reason: "blood in urine or stool, recent hospital discharge, possible vaginal bleed"   Outreach to patient. Primary number listed is disconnected. RN CM attempted alternate contact number and recording stating "no accepting incoming calls at this time."    Plan: RN CM will make outreach attempt to patient within three business days.  Enzo Montgomery, RN,BSN,CCM [place] Management Telephonic Care Management Coordinator Direct Phone: [phone] Toll Free: [phone] Fax: [phone]

## 2015-12-28 ENCOUNTER — Other Ambulatory Visit: Payer: Self-pay

## 2015-12-28 NOTE — Patient Outreach (Signed)
[place] [facility]) Care Management  12/28/2015  ELISAVET WEATHERSBY [birth year] [id number]   Telephone Screen  Referral Date: 3/161/17 Referral Source: Nurse Call Center Referral Reason: "blood in urine or stool, recent hospital discharge, possible vaginal bleed"   Outreach attempt #2 to patient to follow up on nurse line call. Main number remains disconnected. Alternate number not accepting incoming calls and no answer at emergency contact number.   Plan: RN CM will make outreach attempt to patient within 24 business hours.   Enzo Montgomery, RN,BSN,CCM [place] Management Telephonic Care Management Coordinator Direct Phone: [phone] Toll Free: [phone] Fax: [phone]

## 2015-12-31 ENCOUNTER — Ambulatory Visit (INDEPENDENT_AMBULATORY_CARE_PROVIDER_SITE_OTHER): Payer: Commercial Managed Care - HMO | Admitting: Nurse Practitioner

## 2015-12-31 ENCOUNTER — Encounter: Payer: Self-pay | Admitting: Nurse Practitioner

## 2015-12-31 ENCOUNTER — Other Ambulatory Visit: Payer: Self-pay

## 2015-12-31 VITALS — BP 130/66 | HR 58 | Ht 62.0 in | Wt 154.0 lb

## 2015-12-31 DIAGNOSIS — I5023 Acute on chronic systolic (congestive) heart failure: Secondary | ICD-10-CM | POA: Diagnosis not present

## 2015-12-31 LAB — CBC
HCT: 35.4 % — ABNORMAL LOW (ref 36.0–46.0)
Hemoglobin: 11.3 g/dL — ABNORMAL LOW (ref 12.0–15.0)
MCH: 28 pg (ref 26.0–34.0)
MCHC: 31.9 g/dL (ref 30.0–36.0)
MCV: 87.8 fL (ref 78.0–100.0)
MPV: 10.3 fL (ref 8.6–12.4)
Platelets: 230 10*3/uL (ref 150–400)
RBC: 4.03 MIL/uL (ref 3.87–5.11)
RDW: 16.3 % — ABNORMAL HIGH (ref 11.5–15.5)
WBC: 4 10*3/uL (ref 4.0–10.5)

## 2015-12-31 MED ORDER — POTASSIUM CHLORIDE CRYS ER 20 MEQ PO TBCR: 20.0000 meq | EXTENDED_RELEASE_TABLET | Freq: Every day | ORAL | Status: DC

## 2015-12-31 MED ORDER — METOLAZONE 2.5 MG PO TABS: 2.5000 mg | ORAL_TABLET | Freq: Every day | ORAL | Status: DC

## 2015-12-31 NOTE — Patient Instructions (Addendum)
We will be checking the following labs today - BMET and BNP   Medication Instructions:    Continue with your current medicines. BUT  I am adding Zaroxolyn 2.5 mg to take Tuesday, Wednesday and Thursday morning and then STOP  Increase the potassium to 20 meq - take three times a day on Tuesday, Wednesday and Thursday and then back to just once a day    Testing/Procedures To Be Arranged:  N/A  Follow-Up:   See PA/NP on Friday here.     Other Special Instructions:   N/A    If you need a refill on your cardiac medications before your next appointment, please call your pharmacy.   Call the [place] office at [phone] if you have any questions, problems or concerns.

## 2015-12-31 NOTE — Patient Outreach (Signed)
[facility]) Care Management  12/31/2015  [name] [birth year] [id number]   Telephone Screen  Referral Date: 3/161/17 Referral Source: Nurse Call Center Referral Reason: "blood in urine or stool, recent hospital discharge, possible vaginal bleed"  Outreach attempt #3 to patient. No response from patient after multiple attempts at multiple numbers. RN CM will send unsuccessful outreach letter to patient.  Plan: RN CM will send letter to patient and close case if no response from patient within 10 business days.  Enzo Montgomery, RN,BSN,CCM [place] Management Telephonic Care Management Coordinator Direct Phone: [phone] Toll Free: [phone] Fax: [phone]

## 2015-12-31 NOTE — Progress Notes (Signed)
CARDIOLOGY OFFICE NOTE  Date:  12/31/2015    [name] Date of Birth: August 15, 1933 Medical Record [id number]  PCP:  [name], MD  Cardiologist:  Gwenlyn Found    Chief Complaint  Patient presents with  . Cardiomyopathy  . Congestive Heart Failure    TOC visit - seen for Dr. Gwenlyn Found    History of Present Illness: [name] is a 82 y.o. female who presents today for a TOC visit. Seen for Dr. Gwenlyn Found.   She has a PMH of chronic combined systolic and diastolic congestive heart failure with an EF 20 to 25%, hyperlipidemia, diabetes mellitus, GERD, embolic stroke, GI bleeding, uterine cancer, nonobstructive CAD per cath from 2011, reported prior MI, chronic kidney disease-stage IV, tricuspid valve regurgitation, & PAF (not on anticoagulants due to history of GI bleeding).   She presented earlier this month with shortness of breath and worsening leg edema. Found to have decompensated CHF and thus admitted. Palliative care consult mentioned for future plan of care.   Comes back today. Here with her son [name]. He provides most of the history. She is quite hard of hearing - ?from her diuretics. He notes that her swelling has come back. Says it was back within just a day or so of going home.  She remains short of breath. No real improvement since discharge according to him. She sleeps propped up. She will have some intermittent chest pain. Not elevating her legs as much as she should. Does not sound like she is getting too much salt. Scales "messed" up at home. Hard for her to stand still on the scales. He manages her medicines. Says she is back on her usual medicines and that no changes were made.   Past Medical History  Diagnosis Date  . Asthma   . CAD (coronary artery disease)     Pt reports MI in 2006 (no documentation).  Cardiolite in 05/2002 and 07/2006 did not reveal any reversible ischemia.  Pt follows with Dr. Rex Kras at [facility].  . CHF (congestive heart failure) (HCC)     EF  25-30% with dilated LV, mild LVH, severe hypokinesis, and mod-severe reduction in RV function  . Osteoporosis   . HYPERTENSION 08/03/2006  . GASTROPARESIS, DIABETIC 08/03/2006  . HYPERLIPIDEMIA 08/03/2006  . OBSTRUCTIVE SLEEP APNEA 01/06/2008  . PERIPHERAL NEUROPATHY 08/03/2006  . GERD 08/03/2006  . LOW BACK PAIN, CHRONIC 08/03/2006  . OSTEOPOROSIS 03/21/2009  . CEREBRAL EMBOLISM, WITH INFARCTION 07/02/2010  . Angina   . Myocardial infarction ([place]) "2 or 3"  . Pneumonia 02/26/12    "a few times; probably even today"  . DIABETES MELLITUS, TYPE II 11/04/1983  . Blood transfusion     multiple in 2012, 2015.   . GI bleed 08/2011 and 01/2014  . Chronic daily headache     migraines as well.   . Stroke [facility]) summer 2011    "made my left hip worse"  . Uterine cancer ([place])   . Nonischemic cardiomyopathy ([place]) 05/2010    Left heart catheterization:2011. Nonobstructive coronary artery disease.  . Pulmonary hypertension ([place])   . NSTEMI (non-ST elevated myocardial infarction) ([place]) 01/2014    type II with CVA  . Acute embolic stroke ([place]) 0000000  . Non-ST elevation MI (NSTEMI) ([place]) 02/04/2014  . E. coli UTI 03/02/2014  . Renal failure, acute ([place]) 02/04/2014  . Iron deficiency anemia secondary to blood loss (chronic) 01/06/2008    Qualifier: Diagnosis of  By: Tomasa Hosteller MD, Louanne Skye  D.   . Nonsustained ventricular tachycardia ([place])   . Angiodysplasia of intestine with hemorrhage 05/13/2014  . CKD (chronic kidney disease) stage 4, GFR 15-29 ml/min (HCC) 08/21/2014    Past Surgical History  Procedure Laterality Date  . Esophagogastroduodenoscopy  08/26/2011    Procedure: ESOPHAGOGASTRODUODENOSCOPY (EGD);  Surgeon: [name], MD;  Location: [facility] ENDOSCOPY;  Service: Endoscopy;  Laterality: N/A;  . Colonoscopy  08/28/2011    Procedure: COLONOSCOPY;  Surgeon: [name], MD;  Location: [place];  Service: Endoscopy;  Laterality: N/A;  . Vaginal hysterectomy    . Tubal ligation    .  Cataract extraction w/ intraocular lens  implant, bilateral    . Toe surgery      "right big toe; operated on it to straighten it out; it was under"  . Polysomnogram  10/17/2005    AHI-7.28/hr. AHI REM-20.8/hr. Average oxygen saturation range during REM and NREM was 97%. Lowest oxygen saturation during REM sleep was 90%.  . Carotid duplex  05/28/2010    No significant extracranial carotid artery stenosis demonstrated. Vertebrals are patent w/ antegrade flow.  . Cardiac catheterization  05/24/2010    No intervention - recommed medical therapy.  . Cardiovascular stress test  08/07/2006    Moderate-severe defect seen in Basal inferior, Mid inferoseptal, Mid inferior, Mid inferolateral, and Apical inferior regions - consistent w/ infarct/scar. No scintigraphic evidence of inducible myocardial ischemia.  . Transthoracic echocardiogram  08/29/2011    EF 55-60%, moderate LVH,   . Esophagogastroduodenoscopy N/A 02/08/2014    Procedure: ESOPHAGOGASTRODUODENOSCOPY (EGD);  Surgeon: [name], MD;  Location: [facility] ENDOSCOPY;  Service: Endoscopy;  Laterality: N/A;  . Colonoscopy N/A 05/12/2014    Procedure: COLONOSCOPY;  Surgeon: [name], MD;  Location: [place];  Service: Endoscopy;  Laterality: N/A;  . Enteroscopy N/A 05/13/2014    Procedure: ENTEROSCOPY;  Surgeon: [name], MD;  Location: [place];  Service: Endoscopy;  Laterality: N/A;  . Enteroscopy N/A 07/13/2014    Procedure: small bowel enteroscopy;  Surgeon: Jerene Bears, MD;  Location: [facility] ENDOSCOPY;  Service: Endoscopy;  Laterality: N/A;  requests slim colonoscope     Medications: Current Outpatient Prescriptions  Medication Sig Dispense Refill  . acetaminophen (TYLENOL) 500 MG tablet Take 500 mg by mouth every 6 (six) hours as needed for moderate pain or headache.    . albuterol (PROAIR HFA) 108 (90 BASE) MCG/ACT inhaler Inhale 2 puffs into the lungs every 6 (six) hours as needed for wheezing. 8.5 g 11  . amLODipine (NORVASC)  5 MG tablet TAKE 1 TABLET EVERY DAY 30 tablet 11  . aspirin 81 MG tablet Take 81 mg by mouth daily.    Marland Kitchen atorvastatin (LIPITOR) 40 MG tablet TAKE 1 TABLET (40 MG TOTAL) BY MOUTH DAILY AT 6 PM. 30 tablet 10  . hydrALAZINE (APRESOLINE) 25 MG tablet Take 1 tablet (25 mg total) by mouth every 8 (eight) hours. 90 tablet 0  . insulin glargine (LANTUS) 100 UNIT/ML injection Inject 0.07 mLs (7 Units total) into the skin at bedtime. 10 mL 11  . isosorbide mononitrate (IMDUR) 60 MG 24 hr tablet Take 1 tablet (60 mg total) by mouth daily. 30 tablet 2  . loratadine (ALLERGY RELIEF) 10 MG tablet Take 10 mg by mouth daily. Reported on 12/07/2015    . magnesium oxide (MAG-OX) 400 (241.3 MG) MG tablet Take 1 tablet (400 mg total) by mouth 2 (two) times daily. 60 tablet 1  . omeprazole (PRILOSEC) 20  MG capsule Take 1 capsule (20 mg total) by mouth daily before breakfast. 30 capsule 11  . polyethylene glycol (MIRALAX / GLYCOLAX) packet Take 17 g by mouth daily as needed for mild constipation.     . torsemide (DEMADEX) 20 MG tablet Take 2 tablets (40 mg total) by mouth daily. 60 tablet 0  . metolazone (ZAROXOLYN) 2.5 MG tablet Take 1 tablet (2.5 mg total) by mouth daily. 10 tablet 3  . potassium chloride SA (KLOR-CON M20) 20 MEQ tablet Take 1 tablet (20 mEq total) by mouth daily. Take 1 pill three times a day for the next 3 days and then back to just one pill a day 45 tablet 3   No current facility-administered medications for this visit.    Allergies: Allergies  Allergen Reactions  . Baking Soda-Fluoride [Sodium Fluoride] Nausea And Vomiting  . Magnesium Hydroxide Nausea And Vomiting    Social History: The patient  reports that she has never smoked. She quit smokeless tobacco use about 9 years ago. Her smokeless tobacco use included Snuff. She reports that she does not drink alcohol or use illicit drugs.   Family History: The patient's family history includes Diabetes insipidus in her mother; Hypertension  in her child, father, mother, and sister; Stomach cancer in her brother.   Review of Systems: Please see the history of present illness.   Otherwise, the review of systems is positive for none.   All other systems are reviewed and negative.   Physical Exam: VS:  BP 130/66 mmHg  Pulse 58  Ht 5\' 2"  (1.575 m)  Wt 154 lb (69.854 kg)  BMI 28.16 kg/m2  LMP 12/19/1968 .  BMI Body mass index is 28.16 kg/(m^2).  Wt Readings from Last 3 Encounters:  12/31/15 154 lb (69.854 kg)  12/24/15 156 lb 14.4 oz (71.169 kg)  12/10/15 148 lb (67.132 kg)    General: Elderly, chronically ill appearing female who is alert and in no acute distress. She would get short of breath with talking.   HEENT: Normal but with very poor dentition. Neck: Supple, no JVD, carotid bruits, or masses noted.  Cardiac: Regular rate and rhythm. Harsh 2/6 systolic murmur.  Over 2+ edema up to the knees bilaterally.  Respiratory:  Lungs have bibasilar crackles.  GI: Soft and nontender.  MS: No deformity or atrophy. Gait not tested. She is in a wheelchair.  Skin: Warm and dry. Color is normal.  Neuro:  Strength and sensation are intact and no gross focal deficits noted.  Psych: Alert, appropriate and with normal affect.   LABORATORY DATA:  EKG:  EKG is not ordered today.  Lab Results  Component Value Date   WBC 4.7 12/20/2015   HGB 13.9 12/20/2015   HCT 41.0 12/20/2015   PLT 231 12/20/2015   GLUCOSE 204* 12/24/2015   CHOL 114 11/05/2015   TRIG 57 11/05/2015   HDL 60 11/05/2015   LDLCALC 43 11/05/2015   ALT 20 11/07/2015   AST 18 11/07/2015   NA 139 12/24/2015   K 3.6 12/24/2015   CL 103 12/24/2015   CREATININE 2.05* 12/24/2015   BUN 35* 12/24/2015   CO2 27 12/24/2015   TSH 1.773 02/27/2012   INR 1.34 12/21/2015   HGBA1C 9.8* 11/05/2015   MICROALBUR 6.05* 07/12/2009    BNP (last 3 results)  Recent Labs  04/04/15 1100 11/04/15 1350 12/20/15 1736  BNP 1383.6* 1841.7* 1957.6*    ProBNP (last 3  results) No results for input(s): PROBNP in the last  8760 hours.   Other Studies Reviewed Today:  Echo Study Conclusions from 12/2015  - Left ventricle: The cavity size was normal. There was mild  concentric hypertrophy. Systolic function was severely reduced.  The estimated ejection fraction was in the range of 20% to 25%.  Diffuse hypokinesis. Features are consistent with a pseudonormal  left ventricular filling pattern, with concomitant abnormal  relaxation and increased filling pressure (grade 2 diastolic  dysfunction). Doppler parameters are consistent with elevated  ventricular end-diastolic filling pressure. - Ventricular septum: The contour showed diastolic flattening and  systolic flattening. - Mitral valve: Calcified annulus. Mildly thickened leaflets . The  findings are consistent with mild stenosis. There was moderate  regurgitation. - Right ventricle: Systolic function was moderately reduced. - Right atrium: The atrium was mildly dilated. - Tricuspid valve: Structurally normal valve. There was severe  regurgitation. - Pulmonic valve: There was trivial regurgitation. - Pulmonary arteries: Systolic pressure was severely increased. PA  peak pressure: 62 mm Hg (S). - Inferior vena cava: The vessel was dilated. The respirophasic  diameter changes were blunted (< 50%), consistent with elevated  central venous pressure. - Pericardium, extracardiac: A trivial pericardial effusion was  identified posterior to the heart. Features were not consistent  with tamponade physiology.   Assessment/Plan: 1. Acute on chronic systolic heart failure,  Most recent echocardiogram shows LVEF down now to 20-25% with grade 2 diastolic dysfunction, moderate mitral regurgitation, and severe tricuspid regurgitation with PASP 62 mmHg. More swelling on exam and remains symptomatic - have added Zaroxolyn 2.5 mg for the next 3 days and then stop. Increase potassium to 20 meq TID  for three days and then back to just daily. Needs FU here on Friday. Overall prognosis quite tenuous - would most likely benefit from palliative care consult - this was not addressed today since it was my first meeting with her. Very high likelihood for readmission.  2. Valvular heart disease, moderate mitral and severe tricuspid regurgitation. To be managed conservatively.  3. CKD stage 3 to 4 - rechecking labs today  4. Troponin elevation-0.14 during most recent admission: Minor increase in troponin consistent with heart failure and myocardial strain. Would manage conservatively.   Current medicines are reviewed with the patient today.  The patient does not have concerns regarding medicines other than what has been noted above.  The following changes have been made:  See above.  Labs/ tests ordered today include:    Orders Placed This Encounter  Procedures  . Brain natriuretic peptide  . Basic metabolic panel  . CBC     Disposition:   FU with PA/NP on Friday here with repeat labs.    Patient is agreeable to this plan and will call if any problems develop in the interim.   Signed: Burtis Junes, RN, ANP-C 12/31/2015 3:54 PM  [facility] [address] [place], [place]  [address] Phone: [phone] Fax: [phone]

## 2016-01-01 ENCOUNTER — Other Ambulatory Visit: Payer: Self-pay | Admitting: *Deleted

## 2016-01-01 DIAGNOSIS — I509 Heart failure, unspecified: Secondary | ICD-10-CM

## 2016-01-01 DIAGNOSIS — R7989 Other specified abnormal findings of blood chemistry: Secondary | ICD-10-CM

## 2016-01-01 LAB — BASIC METABOLIC PANEL
BUN: 37 mg/dL — ABNORMAL HIGH (ref 7–25)
CO2: 24 mmol/L (ref 20–31)
Calcium: 9 mg/dL (ref 8.6–10.4)
Chloride: 102 mmol/L (ref 98–110)
Creat: 2.06 mg/dL — ABNORMAL HIGH (ref 0.60–0.88)
Glucose, Bld: 299 mg/dL — ABNORMAL HIGH (ref 65–99)
Potassium: 4.3 mmol/L (ref 3.5–5.3)
Sodium: 138 mmol/L (ref 135–146)

## 2016-01-01 LAB — BRAIN NATRIURETIC PEPTIDE: Brain Natriuretic Peptide: 2089.3 pg/mL — ABNORMAL HIGH (ref <100)

## 2016-01-04 ENCOUNTER — Other Ambulatory Visit (INDEPENDENT_AMBULATORY_CARE_PROVIDER_SITE_OTHER): Payer: Commercial Managed Care - HMO

## 2016-01-04 ENCOUNTER — Ambulatory Visit: Payer: Self-pay | Admitting: Physician Assistant

## 2016-01-04 ENCOUNTER — Ambulatory Visit: Payer: Self-pay | Admitting: Nurse Practitioner

## 2016-01-04 DIAGNOSIS — R7989 Other specified abnormal findings of blood chemistry: Secondary | ICD-10-CM

## 2016-01-04 DIAGNOSIS — I509 Heart failure, unspecified: Secondary | ICD-10-CM

## 2016-01-04 LAB — BASIC METABOLIC PANEL
BUN: 45 mg/dL — ABNORMAL HIGH (ref 7–25)
CO2: 26 mmol/L (ref 20–31)
Calcium: 9.7 mg/dL (ref 8.6–10.4)
Chloride: 103 mmol/L (ref 98–110)
Creat: 2.25 mg/dL — ABNORMAL HIGH (ref 0.60–0.88)
Glucose, Bld: 150 mg/dL — ABNORMAL HIGH (ref 65–99)
Potassium: 4.7 mmol/L (ref 3.5–5.3)
Sodium: 139 mmol/L (ref 135–146)

## 2016-01-05 LAB — BRAIN NATRIURETIC PEPTIDE: Brain Natriuretic Peptide: 1793.3 pg/mL — ABNORMAL HIGH (ref <100)

## 2016-01-07 ENCOUNTER — Telehealth: Payer: Self-pay | Admitting: Nurse Practitioner

## 2016-01-07 NOTE — Telephone Encounter (Signed)
Reviewed results with patient's husband who states patient came in on Friday 3/24 and was told they did not have an appointment.  Patient got lab work but was not seen by a provider.  He states patient is coughing and seems to have a cold.  He states the swelling is better and he thinks the weight is stable.  He states the patient cannot come to an appointment until Friday.  He is scheduled to with APP Friday 3/31.  I advised him to call back with questions or concerns prior to appointment.  He verbalized understanding and agreement.

## 2016-01-07 NOTE — Telephone Encounter (Signed)
-----   Message from Burtis Junes, NP sent at 01/07/2016 10:12 AM EDT ----- Please call - BNP down just slightly.  She was to have been seen on Friday - looks like she cancelled. Is she doing better? Less swelling, weight down, etc?? She will need to be seen by someone (PA/NP) this week for a follow up.

## 2016-01-08 ENCOUNTER — Other Ambulatory Visit: Payer: Self-pay

## 2016-01-08 NOTE — Patient Outreach (Addendum)
[place] [facility]) Care Management  01/08/2016  [name] January 21, 1933 [id number]   Telephone Screen  Referral Date: 01/08/16 Referral Source: Nurse Call Center Referral Reason: "blood pressure high(156/62), patient has a cold and chest pain when coughs"   Outreach attempt #1 to patient. Spoke with son(Michael). ROI on file. He states that patient is currently asleep. He reports BP this am was 136/70 prior to taking meds. Discussed ways to manage HTN and when to alert MD of changes in BP readings. Reviewed diet restrictions as well. Son states patient not having much of an appetite lately due to current cold. Offered suggestions to increase po/fluid intake. Son states patient patient has dry non productive cough. Discussed s/s of worsening condition and when to seek medical attention. Son reports patient saw PCP on last week. Son denies any issues with meds, transportation of further RN CM needs at this time.   Plan: RN CM will notify [facility] administrative assistant of case status.  Enzo Montgomery, RN,BSN,CCM [place] Management Telephonic Care Management Coordinator Direct Phone: [phone] Toll Free: [phone] Fax: [phone]

## 2016-01-09 ENCOUNTER — Encounter: Payer: Self-pay | Admitting: Cardiovascular Disease

## 2016-01-10 NOTE — Progress Notes (Unsigned)
This encounter was created in error - please disregard.

## 2016-01-10 NOTE — Progress Notes (Signed)
Cardiology Office Note    Date:  01/11/2016   ID:  [name], DOB 1933-05-23, MRN [id number]  PCP:  [name], MD  Cardiologist: Dr. Gwenlyn Found    Clinic follow-up: CHF    History of Present Illness:  [name] is a 82 y.o. female with a history of chronic combined S/D CHF (EF 20-25%), HLD, DM T2, GERD, embolic stroke, GI bleeding, uterine cancer,nonobstructive CAD per cath from 2011, CKD, TR, and PAF (not on AC due to GI bleeding) who presents to clinic for follow-up.  She was admitted to [facility] from 3/9-3/13/17 for acute on chronic combined heart failure. 2D ECHO showed LVEF 20-25% with G2DD, moderate mitral regurgitation, and severe tricuspid regurgitation with PASP 62 mmHg. Palliative care consult mentioned for future plan of care.   She was seen by Kathrynn Humble NP in the office for [facility] visit after her admission. She continued to be volume overloaded and was placed on metolazone 5 mg 3 days. [name] felt like she was in end-stage heart failure and would benefit from palliative care discussion. However, it was her first meeting with her and she did not have this discussion. She felt like her readmission rate would be high. Her creatinine was checked which had actually increased from discharge from 2.06--> 2.25.  Today she presents to clinic for follow-up. She is not feeling much better. Her legs are significantly less swollen and her breathing is maybe marginally better. Her son is with her today and thinks she is close to her dry weight which he feels is 152lbs on her home scale. About two nights ago she noticed blood in her stool and today she had cupfuls of blood. She does have a history of GI bleeding from AVMs and has had 2 previous ablations. She is followed by Dr. Carlean Purl.   Past Medical History  Diagnosis Date  . Asthma   . CAD (coronary artery disease)     Pt reports MI in 2006 (no documentation).  Cardiolite in 05/2002 and 07/2006 did not reveal any  reversible ischemia.  Pt follows with Dr. Rex Kras at [facility].  . CHF (congestive heart failure) (HCC)     EF 25-30% with dilated LV, mild LVH, severe hypokinesis, and mod-severe reduction in RV function  . Osteoporosis   . HYPERTENSION 08/03/2006  . GASTROPARESIS, DIABETIC 08/03/2006  . HYPERLIPIDEMIA 08/03/2006  . OBSTRUCTIVE SLEEP APNEA 01/06/2008  . PERIPHERAL NEUROPATHY 08/03/2006  . GERD 08/03/2006  . LOW BACK PAIN, CHRONIC 08/03/2006  . OSTEOPOROSIS 03/21/2009  . CEREBRAL EMBOLISM, WITH INFARCTION 07/02/2010  . Angina   . Myocardial infarction ([place]) "2 or 3"  . Pneumonia 02/26/12    "a few times; probably even today"  . DIABETES MELLITUS, TYPE II 11/04/1983  . Blood transfusion     multiple in 2012, 2015.   . GI bleed 08/2011 and 01/2014  . Chronic daily headache     migraines as well.   . Stroke [facility]) summer 2011    "made my left hip worse"  . Uterine cancer ([place])   . Nonischemic cardiomyopathy ([place]) 05/2010    Left heart catheterization:2011. Nonobstructive coronary artery disease.  . Pulmonary hypertension ([place])   . NSTEMI (non-ST elevated myocardial infarction) ([place]) 01/2014    type II with CVA  . Acute embolic stroke ([place]) 0000000  . Non-ST elevation MI (NSTEMI) ([place]) 02/04/2014  . E. coli UTI 03/02/2014  . Renal failure, acute ([place]) 02/04/2014  . Iron deficiency anemia secondary to  blood loss (chronic) 01/06/2008    Qualifier: Diagnosis of  By: Tomasa Hosteller MD, Veronique D.   . Nonsustained ventricular tachycardia ([place])   . Angiodysplasia of intestine with hemorrhage 05/13/2014  . CKD (chronic kidney disease) stage 4, GFR 15-29 ml/min (HCC) 08/21/2014    Past Surgical History  Procedure Laterality Date  . Esophagogastroduodenoscopy  08/26/2011    Procedure: ESOPHAGOGASTRODUODENOSCOPY (EGD);  Surgeon: [name], MD;  Location[facility] ENDOSCOPY;  Service: Endoscopy;  Laterality: N/A;  . Colonoscopy  08/28/2011    Procedure: COLONOSCOPY;  Surgeon: [name], MD;  Location: [facility];  Service: Endoscopy;  Laterality: N/A;  . Vaginal hysterectomy    . Tubal ligation    . Cataract extraction w/ intraocular lens  implant, bilateral    . Toe surgery      "right big toe; operated on it to straighten it out; it was under"  . Polysomnogram  10/17/2005    AHI-7.28/hr. AHI REM-20.8/hr. Average oxygen saturation range during REM and NREM was 97%. Lowest oxygen saturation during REM sleep was 90%.  . Carotid duplex  05/28/2010    No significant extracranial carotid artery stenosis demonstrated. Vertebrals are patent w/ antegrade flow.  . Cardiac catheterization  05/24/2010    No intervention - recommed medical therapy.  . Cardiovascular stress test  08/07/2006    Moderate-severe defect seen in Basal inferior, Mid inferoseptal, Mid inferior, Mid inferolateral, and Apical inferior regions - consistent w/ infarct/scar. No scintigraphic evidence of inducible myocardial ischemia.  . Transthoracic echocardiogram  08/29/2011    EF 55-60%, moderate LVH,   . Esophagogastroduodenoscopy N/A 02/08/2014    Procedure: ESOPHAGOGASTRODUODENOSCOPY (EGD);  Surgeon: [name], MD;  Location: [facility] ENDOSCOPY;  Service: Endoscopy;  Laterality: N/A;  . Colonoscopy N/A 05/12/2014    Procedure: COLONOSCOPY;  Surgeon: [name], MD;  Location: [facility];  Service: Endoscopy;  Laterality: N/A;  . Enteroscopy N/A 05/13/2014    Procedure: ENTEROSCOPY;  Surgeon: [name], MD;  Location: [place];  Service: Endoscopy;  Laterality: N/A;  . Enteroscopy N/A 07/13/2014    Procedure: small bowel enteroscopy;  Surgeon: Jerene Bears, MD;  Location: [facility] ENDOSCOPY;  Service: Endoscopy;  Laterality: N/A;  requests slim colonoscope    Current Medications: Outpatient Prescriptions Prior to Visit  Medication Sig Dispense Refill  . acetaminophen (TYLENOL) 500 MG tablet Take 500 mg by mouth every 6 (six) hours as needed for moderate pain or headache.    . albuterol (PROAIR HFA) 108 (90 BASE) MCG/ACT  inhaler Inhale 2 puffs into the lungs every 6 (six) hours as needed for wheezing. 8.5 g 11  . aspirin 81 MG tablet Take 81 mg by mouth daily.    Marland Kitchen atorvastatin (LIPITOR) 40 MG tablet TAKE 1 TABLET (40 MG TOTAL) BY MOUTH DAILY AT 6 PM. 30 tablet 10  . hydrALAZINE (APRESOLINE) 25 MG tablet Take 1 tablet (25 mg total) by mouth every 8 (eight) hours. 90 tablet 0  . insulin glargine (LANTUS) 100 UNIT/ML injection Inject 0.07 mLs (7 Units total) into the skin at bedtime. 10 mL 11  . isosorbide mononitrate (IMDUR) 60 MG 24 hr tablet Take 1 tablet (60 mg total) by mouth daily. 30 tablet 2  . loratadine (ALLERGY RELIEF) 10 MG tablet Take 10 mg by mouth daily. Reported on 12/07/2015    . magnesium oxide (MAG-OX) 400 (241.3 MG) MG tablet Take 1 tablet (400 mg total) by mouth 2 (two) times daily. 60 tablet 1  . omeprazole (  PRILOSEC) 20 MG capsule Take 1 capsule (20 mg total) by mouth daily before breakfast. 30 capsule 11  . polyethylene glycol (MIRALAX / GLYCOLAX) packet Take 17 g by mouth daily as needed for mild constipation.     . torsemide (DEMADEX) 20 MG tablet Take 2 tablets (40 mg total) by mouth daily. 60 tablet 0  . metolazone (ZAROXOLYN) 2.5 MG tablet Take 1 tablet (2.5 mg total) by mouth daily. 10 tablet 3  . potassium chloride SA (KLOR-CON M20) 20 MEQ tablet Take 1 tablet (20 mEq total) by mouth daily. Take 1 pill three times a day for the next 3 days and then back to just one pill a day 45 tablet 3  . amLODipine (NORVASC) 5 MG tablet TAKE 1 TABLET EVERY DAY 30 tablet 11   No facility-administered medications prior to visit.     Allergies:   Baking soda-fluoride and Magnesium hydroxide   Social History   Social History  . Marital Status: Widowed    Spouse Name: N/A  . Number of Children: 7  . Years of Education: N/A   Occupational History  . Disabled    Social History Main Topics  . Smoking status: Never Smoker   . Smokeless tobacco: Former Systems developer    Types: Snuff    Quit date:  10/13/2006     Comment: 02/26/12 "stopped snuff 4-6 years ago"  . Alcohol Use: No     Comment: "stopped drinking alcohol ~ 1980's"  . Drug Use: No  . Sexual Activity: No   Other Topics Concern  . None   Social History Narrative   ** Merged History Encounter **       Lives with daughter     Family History:  The patient's family history includes Diabetes insipidus in her mother; Hypertension in her child, father, mother, and sister; Stomach cancer in her brother.   ROS:   Please see the history of present illness.    ROS All other systems reviewed and are negative.   PHYSICAL EXAM:   VS:  BP 138/72 mmHg  Pulse 57  Ht 5\' 2"  (1.575 m)  Wt 150 lb 6.4 oz (68.221 kg)  BMI 27.50 kg/m2  SpO2 97%  LMP 12/19/1968   GEN: Well nourished, well developed, in no acute distresselderly and frail chronically ill appearing HEENT: normal Neck: no JVD, carotid bruits, or masses Cardiac: RRR; no murmurs, rubs, or gallops, 1+ LE bilateral edema  Respiratory:  clear to auscultation bilaterally, normal work of breathing GI: soft, nontender, nondistended, + BS MS: no deformity or atrophy Skin: warm and dry, no rash Neuro:  Alert and Oriented x 3, Strength and sensation are intact Psych: euthymic mood, full affect  Wt Readings from Last 3 Encounters:  01/11/16 150 lb 6.4 oz (68.221 kg)  12/31/15 154 lb (69.854 kg)  12/24/15 156 lb 14.4 oz (71.169 kg)      Studies/Labs Reviewed:   EKG:  EKG is NOT ordered today.   Recent Labs: 11/07/2015: ALT 20 12/20/2015: B Natriuretic Peptide 1957.6* 12/31/2015: Hemoglobin 11.3*; Platelets 230 01/04/2016: BUN 45*; Creat 2.25*; Potassium 4.7; Sodium 139   Lipid Panel    Component Value Date/Time   CHOL 114 11/05/2015 0411   TRIG 57 11/05/2015 0411   HDL 60 11/05/2015 0411   CHOLHDL 1.9 11/05/2015 0411   VLDL 11 11/05/2015 0411   LDLCALC 43 11/05/2015 0411    Additional studies/ records that were reviewed today include:  Echo Study Conclusions  from 12/2015  -  Left ventricle: The cavity size was normal. There was mild  concentric hypertrophy. Systolic function was severely reduced.  The estimated ejection fraction was in the range of 20% to 25%.  Diffuse hypokinesis. Features are consistent with a pseudonormal  left ventricular filling pattern, with concomitant abnormal  relaxation and increased filling pressure (grade 2 diastolic  dysfunction). Doppler parameters are consistent with elevated  ventricular end-diastolic filling pressure. - Ventricular septum: The contour showed diastolic flattening and  systolic flattening. - Mitral valve: Calcified annulus. Mildly thickened leaflets . The  findings are consistent with mild stenosis. There was moderate  regurgitation. - Right ventricle: Systolic function was moderately reduced. - Right atrium: The atrium was mildly dilated. - Tricuspid valve: Structurally normal valve. There was severe  regurgitation. - Pulmonic valve: There was trivial regurgitation. - Pulmonary arteries: Systolic pressure was severely increased. PA  peak pressure: 62 mm Hg (S). - Inferior vena cava: The vessel was dilated. The respirophasic  diameter changes were blunted (< 50%), consistent with elevated  central venous pressure. - Pericardium, extracardiac: A trivial pericardial effusion was  identified posterior to the heart. Features were not consistent  with tamponade physiology.    ASSESSMENT:    1. Chronic systolic congestive heart failure (HCC)   2. Valvular heart disease   3. CKD (chronic kidney disease), unspecified stage   4. Essential hypertension   5. PAF (paroxysmal atrial fibrillation) ([place])   6. History of lower GI bleeding      PLAN:  In order of problems listed above:  1. Chronic combined S/D CHF: Most recent echocardiogram shows LVEF down now to 20-25% with grade 2 diastolic dysfunction, moderate mitral regurgitation, and severe tricuspid regurgitation with  PASP 62 mmHg.   -- Continue Demedex 40 mg daily and K-Dur 20 mEq daily. She was given metolazone 2.5 mg 3 days with improvement. She is close to her dry weight which is 152 pounds on her home scale. I've instructed her and her son to take a 2.5 mg of metolazone every time her weight goes over 152 pounds. She will also take an extra potassium as well.   -- Continue Toprol XL 25mg  daily. No Ace or are due to a CKD. Continue hydralazine and nitrates.   2. Valvular heart disease: moderate mitral and severe tricuspid regurgitation. To be managed conservatively.   3. CKD stage 3 to 4: 2.06--> 2.25. Will check BMET today   4. HTN: BP 138/72. Continue amlodipine 5 daily, hydralazine 25 mg every 8 hours, Imdur 60 mg daily and Demadex 40 mg daily   5. PAF: maintaining NSR today. CHADSVASC of at least 5. Not a candidate for [place] due to hx of GI bleeding.   5. Hx of GI bleeding/BRBPR: About two nights ago she noticed blood in her stool and today she had cupfuls of blood. She does have a history of GI bleeding from AVMs and has had 2 previous ablations. She is followed by Dr. Carlean Purl. She does not appear hypotensive or tachycardic. I will get a stat CBC and if her blood counts are much lower than baseline I will have her go to the ER. If her blood counts are stable we will have her follow up with GI early next week.  Medication Adjustments/Labs and Tests Ordered: Current medicines are reviewed at length with the patient today.  Concerns regarding medicines are outlined above.  Medication changes, Labs and Tests ordered today are listed in the Patient Instructions below. Patient Instructions  Medication Instructions:  Your  physician has recommended you make the following change in your medication:  1.  TAKE a extra Metolazone 2.5 mg when your weight is 152 or higher 2.  TAKE a extra Potassium when you have to take an extra Metolazone   Labwork: TODAY:  CBC, BMET  Testing/Procedures: None  ordered  Follow-Up: Your physician recommends that you schedule a follow up appointment in 1 WEEK WITH EXTENDER    Any Other Special Instructions Will Be Listed Below (If Applicable).     If you need a refill on your cardiac medications before your next appointment, please call your pharmacy.       Renea Ee  01/11/2016 4:26 PM    [facility] [address], [place], [place]  [address] Phone: [phone]; Fax: [phone]

## 2016-01-11 ENCOUNTER — Encounter: Payer: Self-pay | Admitting: Physician Assistant

## 2016-01-11 ENCOUNTER — Ambulatory Visit (INDEPENDENT_AMBULATORY_CARE_PROVIDER_SITE_OTHER): Payer: Commercial Managed Care - HMO | Admitting: Physician Assistant

## 2016-01-11 VITALS — BP 138/72 | HR 57 | Ht 62.0 in | Wt 150.4 lb

## 2016-01-11 DIAGNOSIS — I429 Cardiomyopathy, unspecified: Secondary | ICD-10-CM

## 2016-01-11 DIAGNOSIS — I493 Ventricular premature depolarization: Secondary | ICD-10-CM

## 2016-01-11 DIAGNOSIS — I5023 Acute on chronic systolic (congestive) heart failure: Secondary | ICD-10-CM

## 2016-01-11 DIAGNOSIS — I10 Essential (primary) hypertension: Secondary | ICD-10-CM

## 2016-01-11 DIAGNOSIS — R0602 Shortness of breath: Secondary | ICD-10-CM | POA: Diagnosis not present

## 2016-01-11 DIAGNOSIS — I5022 Chronic systolic (congestive) heart failure: Secondary | ICD-10-CM | POA: Diagnosis not present

## 2016-01-11 DIAGNOSIS — I480 Paroxysmal atrial fibrillation: Secondary | ICD-10-CM

## 2016-01-11 DIAGNOSIS — N189 Chronic kidney disease, unspecified: Secondary | ICD-10-CM

## 2016-01-11 DIAGNOSIS — Z8719 Personal history of other diseases of the digestive system: Secondary | ICD-10-CM | POA: Diagnosis not present

## 2016-01-11 DIAGNOSIS — I38 Endocarditis, valve unspecified: Secondary | ICD-10-CM

## 2016-01-11 LAB — CBC
HCT: 33.4 % — ABNORMAL LOW (ref 36.0–46.0)
HEMOGLOBIN: 10.7 g/dL — AB (ref 12.0–15.0)
MCH: 27 pg (ref 26.0–34.0)
MCHC: 32 g/dL (ref 30.0–36.0)
MCV: 84.1 fL (ref 78.0–100.0)
Platelets: 226 10*3/uL (ref 150–400)
RBC: 3.97 MIL/uL (ref 3.87–5.11)
RDW: 16.5 % — ABNORMAL HIGH (ref 11.5–15.5)
WBC: 3 10*3/uL — ABNORMAL LOW (ref 4.0–10.5)

## 2016-01-11 LAB — BASIC METABOLIC PANEL
BUN: 52 mg/dL — ABNORMAL HIGH (ref 7–25)
CALCIUM: 8.4 mg/dL — AB (ref 8.6–10.4)
CO2: 26 mmol/L (ref 20–31)
CREATININE: 2.21 mg/dL — AB (ref 0.60–0.88)
Chloride: 102 mmol/L (ref 98–110)
GLUCOSE: 292 mg/dL — AB (ref 65–99)
Potassium: 4 mmol/L (ref 3.5–5.3)
SODIUM: 138 mmol/L (ref 135–146)

## 2016-01-11 MED ORDER — METOLAZONE 2.5 MG PO TABS: ORAL_TABLET | ORAL | Status: DC

## 2016-01-11 MED ORDER — POTASSIUM CHLORIDE CRYS ER 20 MEQ PO TBCR: EXTENDED_RELEASE_TABLET | ORAL | Status: AC

## 2016-01-11 NOTE — Patient Instructions (Addendum)
Medication Instructions:  Your physician has recommended you make the following change in your medication:  1.  TAKE a extra Metolazone 2.5 mg when your weight is 152 or higher 2.  TAKE a extra Potassium when you have to take an extra Metolazone   Labwork: TODAY:  CBC, BMET  Testing/Procedures: None ordered  Follow-Up: Your physician recommends that you schedule a follow up appointment in 1 WEEK WITH EXTENDER    Any Other Special Instructions Will Be Listed Below (If Applicable).     If you need a refill on your cardiac medications before your next appointment, please call your pharmacy.

## 2016-01-12 ENCOUNTER — Observation Stay (HOSPITAL_COMMUNITY): Payer: Commercial Managed Care - HMO

## 2016-01-12 ENCOUNTER — Observation Stay (HOSPITAL_COMMUNITY)
Admission: EM | Admit: 2016-01-12 | Discharge: 2016-01-12 | Disposition: A | Discharge status: Home / Self Care | Payer: Commercial Managed Care - HMO | Attending: Emergency Medicine | Admitting: Emergency Medicine

## 2016-01-12 ENCOUNTER — Encounter (HOSPITAL_COMMUNITY): Payer: Self-pay | Admitting: Emergency Medicine

## 2016-01-12 DIAGNOSIS — I5042 Chronic combined systolic (congestive) and diastolic (congestive) heart failure: Secondary | ICD-10-CM | POA: Diagnosis not present

## 2016-01-12 DIAGNOSIS — Z8673 Personal history of transient ischemic attack (TIA), and cerebral infarction without residual deficits: Secondary | ICD-10-CM | POA: Diagnosis not present

## 2016-01-12 DIAGNOSIS — I10 Essential (primary) hypertension: Secondary | ICD-10-CM | POA: Diagnosis not present

## 2016-01-12 DIAGNOSIS — I252 Old myocardial infarction: Secondary | ICD-10-CM | POA: Diagnosis not present

## 2016-01-12 DIAGNOSIS — N179 Acute kidney failure, unspecified: Secondary | ICD-10-CM | POA: Diagnosis not present

## 2016-01-12 DIAGNOSIS — E1122 Type 2 diabetes mellitus with diabetic chronic kidney disease: Secondary | ICD-10-CM | POA: Insufficient documentation

## 2016-01-12 DIAGNOSIS — Z7982 Long term (current) use of aspirin: Secondary | ICD-10-CM | POA: Diagnosis not present

## 2016-01-12 DIAGNOSIS — I130 Hypertensive heart and chronic kidney disease with heart failure and stage 1 through stage 4 chronic kidney disease, or unspecified chronic kidney disease: Secondary | ICD-10-CM | POA: Diagnosis not present

## 2016-01-12 DIAGNOSIS — E118 Type 2 diabetes mellitus with unspecified complications: Secondary | ICD-10-CM

## 2016-01-12 DIAGNOSIS — D500 Iron deficiency anemia secondary to blood loss (chronic): Secondary | ICD-10-CM | POA: Diagnosis not present

## 2016-01-12 DIAGNOSIS — K219 Gastro-esophageal reflux disease without esophagitis: Secondary | ICD-10-CM | POA: Insufficient documentation

## 2016-01-12 DIAGNOSIS — N189 Chronic kidney disease, unspecified: Secondary | ICD-10-CM | POA: Insufficient documentation

## 2016-01-12 DIAGNOSIS — K922 Gastrointestinal hemorrhage, unspecified: Principal | ICD-10-CM | POA: Insufficient documentation

## 2016-01-12 DIAGNOSIS — I5032 Chronic diastolic (congestive) heart failure: Secondary | ICD-10-CM | POA: Diagnosis present

## 2016-01-12 DIAGNOSIS — Z794 Long term (current) use of insulin: Secondary | ICD-10-CM | POA: Diagnosis not present

## 2016-01-12 DIAGNOSIS — I480 Paroxysmal atrial fibrillation: Secondary | ICD-10-CM | POA: Diagnosis not present

## 2016-01-12 DIAGNOSIS — J811 Chronic pulmonary edema: Secondary | ICD-10-CM

## 2016-01-12 DIAGNOSIS — E1165 Type 2 diabetes mellitus with hyperglycemia: Secondary | ICD-10-CM | POA: Diagnosis present

## 2016-01-12 DIAGNOSIS — IMO0002 Reserved for concepts with insufficient information to code with codable children: Secondary | ICD-10-CM | POA: Insufficient documentation

## 2016-01-12 DIAGNOSIS — I2510 Atherosclerotic heart disease of native coronary artery without angina pectoris: Secondary | ICD-10-CM | POA: Diagnosis not present

## 2016-01-12 DIAGNOSIS — K2961 Other gastritis with bleeding: Secondary | ICD-10-CM

## 2016-01-12 LAB — CBC WITH DIFFERENTIAL/PLATELET
BASOS ABS: 0 10*3/uL (ref 0.0–0.1)
Basophils Relative: 1 %
Eosinophils Absolute: 0 10*3/uL (ref 0.0–0.7)
Eosinophils Relative: 0 %
HEMATOCRIT: 35.3 % — AB (ref 36.0–46.0)
Hemoglobin: 11 g/dL — ABNORMAL LOW (ref 12.0–15.0)
LYMPHS ABS: 1 10*3/uL (ref 0.7–4.0)
LYMPHS PCT: 29 %
MCH: 26.4 pg (ref 26.0–34.0)
MCHC: 31.2 g/dL (ref 30.0–36.0)
MCV: 84.9 fL (ref 78.0–100.0)
MONO ABS: 0.5 10*3/uL (ref 0.1–1.0)
Monocytes Relative: 14 %
NEUTROS ABS: 1.8 10*3/uL (ref 1.7–7.7)
Neutrophils Relative %: 56 %
Platelets: 235 10*3/uL (ref 150–400)
RBC: 4.16 MIL/uL (ref 3.87–5.11)
RDW: 16.7 % — ABNORMAL HIGH (ref 11.5–15.5)
WBC: 3.3 10*3/uL — AB (ref 4.0–10.5)

## 2016-01-12 LAB — IRON AND TIBC
Iron: 23 ug/dL — ABNORMAL LOW (ref 28–170)
SATURATION RATIOS: 8 % — AB (ref 10.4–31.8)
TIBC: 304 ug/dL (ref 250–450)
UIBC: 281 ug/dL

## 2016-01-12 LAB — CBC
HEMATOCRIT: 35.6 % — AB (ref 36.0–46.0)
HEMOGLOBIN: 11.4 g/dL — AB (ref 12.0–15.0)
MCH: 27.2 pg (ref 26.0–34.0)
MCHC: 32 g/dL (ref 30.0–36.0)
MCV: 85 fL (ref 78.0–100.0)
Platelets: 228 10*3/uL (ref 150–400)
RBC: 4.19 MIL/uL (ref 3.87–5.11)
RDW: 16.7 % — ABNORMAL HIGH (ref 11.5–15.5)
WBC: 3.7 10*3/uL — ABNORMAL LOW (ref 4.0–10.5)

## 2016-01-12 LAB — COMPREHENSIVE METABOLIC PANEL
ALK PHOS: 261 U/L — AB (ref 38–126)
ALT: 30 U/L (ref 14–54)
AST: 42 U/L — AB (ref 15–41)
Albumin: 3.4 g/dL — ABNORMAL LOW (ref 3.5–5.0)
Anion gap: 11 (ref 5–15)
BILIRUBIN TOTAL: 1 mg/dL (ref 0.3–1.2)
BUN: 50 mg/dL — AB (ref 6–20)
CALCIUM: 8.8 mg/dL — AB (ref 8.9–10.3)
CO2: 26 mmol/L (ref 22–32)
CREATININE: 2.25 mg/dL — AB (ref 0.44–1.00)
Chloride: 104 mmol/L (ref 101–111)
GFR, EST AFRICAN AMERICAN: 23 mL/min — AB (ref >60)
GFR, EST NON AFRICAN AMERICAN: 19 mL/min — AB (ref >60)
Glucose, Bld: 254 mg/dL — ABNORMAL HIGH (ref 65–99)
Potassium: 3.8 mmol/L (ref 3.5–5.1)
Sodium: 141 mmol/L (ref 135–145)
TOTAL PROTEIN: 7 g/dL (ref 6.5–8.1)

## 2016-01-12 LAB — RETICULOCYTES
RBC.: 4.19 MIL/uL (ref 3.87–5.11)
RETIC CT PCT: 1 % (ref 0.4–3.1)
Retic Count, Absolute: 41.9 10*3/uL (ref 19.0–186.0)

## 2016-01-12 LAB — SAMPLE TO BLOOD BANK

## 2016-01-12 LAB — FERRITIN: FERRITIN: 226 ng/mL (ref 11–307)

## 2016-01-12 LAB — FOLATE: Folate: 11.3 ng/mL (ref >5.9)

## 2016-01-12 LAB — CBG MONITORING, ED: Glucose-Capillary: 258 mg/dL — ABNORMAL HIGH (ref 65–99)

## 2016-01-12 LAB — VITAMIN B12: VITAMIN B 12: 1743 pg/mL — AB (ref 180–914)

## 2016-01-12 MED ORDER — OMEPRAZOLE 20 MG PO CPDR: 20.0000 mg | DELAYED_RELEASE_CAPSULE | Freq: Two times a day (BID) | ORAL | Status: DC

## 2016-01-12 MED ORDER — ALBUTEROL SULFATE (2.5 MG/3ML) 0.083% IN NEBU
2.5000 mg | INHALATION_SOLUTION | Freq: Once | RESPIRATORY_TRACT | Status: AC
  Administered 2016-01-12: 2.5 mg via RESPIRATORY_TRACT
  Filled 2016-01-12: qty 3

## 2016-01-12 NOTE — ED Notes (Signed)
Admitting MD at the bedside, he is going to do another Chest X-ray and then make a decision on discharge the pt. home

## 2016-01-12 NOTE — ED Notes (Signed)
Pt. Was discharged to home

## 2016-01-12 NOTE — ED Notes (Signed)
Admitting MD at bedside.

## 2016-01-12 NOTE — ED Notes (Signed)
Diet tray ordered for breakfast, carb modified

## 2016-01-12 NOTE — Discharge Summary (Signed)
Physician Discharge Summary  [name] [id number] DOB: 09/12/1933 DOA: 01/12/2016  PCP: [name], MD  Admit date: 01/12/2016 Discharge date: 01/12/2016  Time spent:50 minutes   Recommendations for Outpatient Follow-up:  #1. Bright red blood per rectum/GI bleed. Patient with history of same related to AVMs last ablation October 2015. Hemoglobin 11.0 on presentation. Hemodynamically stable and not hypoxic. patient asymptomatic. Chart review indicates GI workup in October 2015 recommended monitoring Hgb and iron studies. In addition note indicated would only pursue video capsule endoscopy if patient willing to undergo deep enteroscopy at tertiary care center. In the meantime, she may require periodic blood transfusion. -Hemoglobin at baseline range -Recheck hemoglobin 2 hours -Check iron level -If stable recommend discharge to home with follow-up next week gastroenterology -Discussed with family at length symptomatic anemia related to GI bleed. Recommended returning to the emergency department should she become symptomatic -Recommend discussion of palliative care  #2. Chronic Diastolic and Systolic CHF. Cardiologist visit 2 days ago and note indicates most recent echocardiogram shows LVEF found to 20-25% with grade 2 diastolic dysfunction, moderate mitral regurg and severe tricuspid regurg. She remains compensated.  #3. Diabetes Type 2 uncontrolled with complications. Serum glucose 254 -Continue home regimen  4. Hypertension. Solid blood pressure range 160 4167. Diastolic range [id number]. -Continue home regimen  #5. Chronic kidney disease. Creatinine stable at baseline  #6. PAF. [place] Vasc score 5. Not a candidate for anticoagulation due to GI bleed.  #7. Anemia: microcytic. Hx IDA related to chronic gi bleed. Hg stable at 11.4. OP follow up GI. See #1.  #8 GERD -Increase omeprazole 20 mg BID  #9 Goals of care -Would benefit both family and patient to discuss with their PCP  short-term  vs long-term goals of care. Would recommend changing patient status to DO NOT RESUSCITATE, ensuring a HCPOA has been appointed in writing, and establishing a living will.    Discharge Diagnoses:  Principal Problem:   GI bleed Active Problems:   DM (diabetes mellitus), type 2, uncontrolled (HCC)   Iron deficiency anemia secondary to blood loss (chronic)   Essential hypertension   ARF (acute renal failure) (HCC)   Chronic diastolic CHF EF [id number]   Discharge Condition: Stable  Diet recommendation: Heart healthy/carb modified  There were no vitals filed for this visit.  History of present illness:   [name] is an 82 year old BF PMHx chronic combined systolic/diastolic CHF with an EF of 20-25% diabetes, GERD, CVA, chronic GI bleed, nonobstructive CAD, chronic kidney disease, PAF not on anticoagulation due to GI bleed presented to the emergency Department chief complaint of bright red blood per rectum.   Information is obtained from the chart and the family who is at the bedside. Patient has a history of GI bleed and history of transfusion secondary to GI bleeds. Family reports small amount of ongoing rectal bleeding they decided to come to the emergency department for evaluation. There is been no worsening shortness of breath no headache dizziness syncope or near-syncope. Patient denies any generalized weakness or lightheadedness. No nausea vomiting or coffee ground emesis. She denies any abdominal pain bloating or distention. She denies fever chills chest pain palpitations. She denies any worsening lower extremity edema. She denies any dysuria hematuria frequency or urgency. She denies diarrhea constipation. He has complaint of chronic "chest congestion". Family reports she has a moist nonproductive cough she uses an inhaler at home. They deny any worsening of this cough.  Chart review reveals a history of GI bleed from  AVMs has had 2 previous ablations. She is followed by Dr.  Carlean Purl. Last colonoscopy in July 2015. Most recent enteroscopy October 2015  The emergency department she is afebrile hemodynamically stable and not hypoxic. During his hospitalization patient received breathing treatment which improved her SOB. Also explained to patient the absolute necessity of participating with her physical therapist in order to increase her remaining quality of life regards to her poor cardiac condition.    Procedures: 4/1 CXR;-Heart is moderately enlarged. -Mild diffuse pulmonary edema. -Compared to previous CXR pulmonary edema improved.    Discharge Exam: Filed Vitals:   01/12/16 0645 01/12/16 0700 01/12/16 0945 01/12/16 1047  BP: 167/78 164/86  152/74  Pulse: 57 58 66 69  Temp:      TempSrc:      Resp: 13 32 28 17  SpO2: 98% 98% 100% 96%    General: A/O 4, NAD, cachectic Cardiovascular: Regular irregular heart sounds, negative murmurs rubs or gallops, normal S1/S2 Respiratory: RML/RLL positive rhonchi consistent with CHF   Discharge Instructions     Medication List    ASK your doctor about these medications        acetaminophen 500 MG tablet  Commonly known as:  TYLENOL  Take 500 mg by mouth every 6 (six) hours as needed for moderate pain or headache.     albuterol 108 (90 Base) MCG/ACT inhaler  Commonly known as:  PROAIR HFA  Inhale 2 puffs into the lungs every 6 (six) hours as needed for wheezing.     ALLERGY RELIEF 10 MG tablet  Generic drug:  loratadine  Take 10 mg by mouth daily. Reported on 12/07/2015     amLODipine 5 MG tablet  Commonly known as:  NORVASC  Take 1 tablet by mouth daily.     aspirin 81 MG tablet  Take 81 mg by mouth daily.     atorvastatin 40 MG tablet  Commonly known as:  LIPITOR  TAKE 1 TABLET (40 MG TOTAL) BY MOUTH DAILY AT 6 PM.     hydrALAZINE 25 MG tablet  Commonly known as:  APRESOLINE  Take 1 tablet (25 mg total) by mouth every 8 (eight) hours.     insulin glargine 100 UNIT/ML injection  Commonly  known as:  LANTUS  Inject 0.07 mLs (7 Units total) into the skin at bedtime.     isosorbide mononitrate 60 MG 24 hr tablet  Commonly known as:  IMDUR  Take 1 tablet (60 mg total) by mouth daily.     magnesium oxide 400 (241.3 Mg) MG tablet  Commonly known as:  MAG-OX  Take 1 tablet (400 mg total) by mouth 2 (two) times daily.     metolazone 2.5 MG tablet  Commonly known as:  ZAROXOLYN  TAKE 1 TABLET BY MOUTH DAILY AND 1 TABLET AS NEEDED WHEN WEIGHT IS OVER 152 LBS     metoprolol succinate 25 MG 24 hr tablet  Commonly known as:  TOPROL-XL  Take 1 tablet by mouth daily.     NOVOLOG FLEXPEN 100 UNIT/ML FlexPen  Generic drug:  insulin aspart  Inject 2-5 Units into the skin 3 (three) times daily with meals.     omeprazole 20 MG capsule  Commonly known as:  PRILOSEC  Take 1 capsule (20 mg total) by mouth daily before breakfast.     polyethylene glycol packet  Commonly known as:  MIRALAX / GLYCOLAX  Take 17 g by mouth daily as needed for mild constipation.     potassium  chloride SA 20 MEQ tablet  Commonly known as:  KLOR-CON M20  TAKE 1 TABLET DAILY AND TAKE AN EXTRA TABLET WHEN YOU HAVE TO TAKE AN EXTRA METOLAZONE FOR WT OVER 152 LBS     torsemide 20 MG tablet  Commonly known as:  DEMADEX  Take 2 tablets (40 mg total) by mouth daily.       Allergies  Allergen Reactions  . Baking Soda-Fluoride [Sodium Fluoride] Nausea And Vomiting  . Magnesium Hydroxide Nausea And Vomiting      The results of significant diagnostics from this hospitalization (including imaging, microbiology, ancillary and laboratory) are listed below for reference.    Significant Diagnostic Studies: Dg Chest 2 View  01/12/2016  CLINICAL DATA:  Pulmonary edema EXAM: CHEST  2 VIEW COMPARISON:  12/20/2015 FINDINGS: The heart is moderately enlarged. Mild diffuse pulmonary edema. Tiny pleural effusions. No pneumothorax. IMPRESSION: Mild CHF.  Tiny pleural effusions. Electronically Signed   By: Marybelle Killings  M.D.   On: 01/12/2016 11:45   Dg Chest 2 View  12/20/2015  CLINICAL DATA:  For 3 years patient has been severely SOB. Patient states she can not remember a lot. HX CHF, CAD, HTN, Diabetes, myocardial infarction, pneumonia, stroke 2011, cardiac catheterization EXAM: CHEST  2 VIEW COMPARISON:  11/04/2015 FINDINGS: Heart size is markedly enlarged but stable in appearance. Interstitial edema again noted. Small right pleural effusion. There are no focal consolidations. Visualized osseous structures have a normal appearance. IMPRESSION: Stable marked enlargement of the heart/pericardial silhouette. CHF/interstitial edema and small right effusion. Electronically Signed   By: Nolon Nations M.D.   On: 12/20/2015 17:55    Microbiology: No results found for this or any previous visit (from the past 240 hour(s)).   Labs: Basic Metabolic Panel:  Recent Labs Lab 01/11/16 1525 01/12/16 0410  NA 138 141  K 4.0 3.8  CL 102 104  CO2 26 26  GLUCOSE 292* 254*  BUN 52* 50*  CREATININE 2.21* 2.25*  CALCIUM 8.4* 8.8*   Liver Function Tests:  Recent Labs Lab 01/12/16 0410  AST 42*  ALT 30  ALKPHOS 261*  BILITOT 1.0  PROT 7.0  ALBUMIN 3.4*   No results for input(s): LIPASE, AMYLASE in the last 168 hours. No results for input(s): AMMONIA in the last 168 hours. CBC:  Recent Labs Lab 01/11/16 1650 01/12/16 0410 01/12/16 0950  WBC 3.0* 3.3* 3.7*  NEUTROABS  --  1.8  --   HGB 10.7* 11.0* 11.4*  HCT 33.4* 35.3* 35.6*  MCV 84.1 84.9 85.0  PLT 226 235 228   Cardiac Enzymes: No results for input(s): CKTOTAL, CKMB, CKMBINDEX, TROPONINI in the last 168 hours. BNP: BNP (last 3 results)  Recent Labs  04/04/15 1100 11/04/15 1350 12/20/15 1736  BNP 1383.6* 1841.7* 1957.6*    ProBNP (last 3 results) No results for input(s): PROBNP in the last 8760 hours.  CBG: No results for input(s): GLUCAP in the last 168 hours.     Signed:  Dia Crawford, MD [facility] [phone]  pager

## 2016-01-12 NOTE — ED Notes (Signed)
Family reports dark loose stools for 2 days with generalized weakness and mild headache , denies pain / respirations unlabored.

## 2016-01-12 NOTE — ED Notes (Signed)
Dr. Campos at bedside   

## 2016-01-12 NOTE — Consult Note (Signed)
[facility] Medical Consultation  [name] [id number] DOB: 1933/08/06 DOA: 01/12/2016 PCP: [name], MD   Requesting physician: Venora Maples Date of consultation: 01/12/16 Reason for consultation: admission  Impression/Recommendations Principal Problem:   GI bleed Active Problems:   DM (diabetes mellitus), type 2, uncontrolled ([name])   Iron deficiency anemia secondary to blood loss (chronic)   Essential hypertension   ARF (acute renal failure) (HCC)   Chronic diastolic CHF EF [id number]   #1. Bright red blood per rectum/GI bleed. Patient with history of same related to AVMs last ablation October 2015. Hemoglobin 11.0 on presentation. Hemodynamically stable and not hypoxic. patient asymptomatic. Chart review indicates GI workup in October 2015 recommended monitoring Hgb and iron studies. In addition note indicated would only pursue video capsule endoscopy if patient willing to undergo deep enteroscopy at tertiary care center. In the meantime, she may require periodic blood transfusion. -Hemoglobin at baseline range -Recheck hemoglobin 2 hours -Check iron level -If stable recommend discharge to home with follow-up next week gastroenterology -Discussed with family at length symptomatic anemia related to GI bleed. Recommended returning to the emergency department should she become symptomatic -Recommend discussion of palliative care  #2. Chronic systolic and systolic CHF. Cardiologist visit 2 days ago and note indicates most recent echocardiogram shows LVEF found to 20-25% with grade 2 diastolic dysfunction, moderate mitral regurg and severe tricuspid regurg. She remains compensated.  #3. Diabetes. Serum glucose 254 -Continue home regimen  4. Hypertension. Solid blood pressure range 160 4167. Diastolic range [id number]. -Continue home regimen  #5. Chronic kidney disease. Creatinine stable at baseline  #6. PAF. [place] Vasc score 5. Not a candidate for anticoagulation due to GI  bleed. #7. Anemia: microcytic. Hx IDA related to chronic gi bleed. Hg stable at 11.4. OP follow up GI. See #1.     Please contact me if I can be of assistance in the meanwhile. Thank you for this consultation.  Chief Complaint: BRBPR  HPI: [name] is an 82 year old female with a history of chronic combined systolic/diastolic CHF with an EF of 20-25% diabetes, GERD, CVA, chronic GI bleed, nonobstructive CAD, chronic kidney disease, PAF not on anticoagulation due to GI bleed presented to the emergency Department chief complaint of bright red blood per rectum.   Information is obtained from the chart and the family who is at the bedside. Patient has a history of GI bleed and history of transfusion secondary to GI bleeds. Family reports small amount of ongoing rectal bleeding they decided to come to the emergency department for evaluation. There is been no worsening shortness of breath no headache dizziness syncope or near-syncope. Patient denies any generalized weakness or lightheadedness. No nausea vomiting or coffee ground emesis. She denies any abdominal pain bloating or distention. She denies fever chills chest pain palpitations. She denies any worsening lower extremity edema. She denies any dysuria hematuria frequency or urgency. She denies diarrhea constipation. He has complaint of chronic "chest congestion". Family reports she has a moist nonproductive cough she uses an inhaler at home. They deny any worsening of this cough.  Chart review reveals a history of GI bleed from AVMs has had 2 previous ablations. She is followed by Dr. Carlean Purl. Last colonoscopy in July 2015. Most recent enteroscopy October 2015  The emergency department she is afebrile hemodynamically stable and not hypoxic.    Review of Systems:  10 point review of systems complete and all systems are negative except as indicated in the history of present illness  Past  Medical History  Diagnosis Date  . Asthma   . CAD  (coronary artery disease)     Pt reports MI in 2006 (no documentation).  Cardiolite in 05/2002 and 07/2006 did not reveal any reversible ischemia.  Pt follows with Dr. Rex Kras at [facility]  . CHF (congestive heart failure) (HCC)     EF 25-30% with dilated LV, mild LVH, severe hypokinesis, and mod-severe reduction in RV function  . Osteoporosis   . HYPERTENSION 08/03/2006  . GASTROPARESIS, DIABETIC 08/03/2006  . HYPERLIPIDEMIA 08/03/2006  . OBSTRUCTIVE SLEEP APNEA 01/06/2008  . PERIPHERAL NEUROPATHY 08/03/2006  . GERD 08/03/2006  . LOW BACK PAIN, CHRONIC 08/03/2006  . OSTEOPOROSIS 03/21/2009  . CEREBRAL EMBOLISM, WITH INFARCTION 07/02/2010  . Angina   . Myocardial infarction ([place]) "2 or 3"  . Pneumonia 02/26/12    "a few times; probably even today"  . DIABETES MELLITUS, TYPE II 11/04/1983  . Blood transfusion     multiple in 2012, 2015.   . GI bleed 08/2011 and 01/2014  . Chronic daily headache     migraines as well.   . Stroke [facility]) summer 2011    "made my left hip worse"  . Uterine cancer ([place])   . Nonischemic cardiomyopathy ([place]) 05/2010    Left heart catheterization:2011. Nonobstructive coronary artery disease.  . Pulmonary hypertension ([place])   . NSTEMI (non-ST elevated myocardial infarction) ([place]) 01/2014    type II with CVA  . Acute embolic stroke ([place]) 0000000  . Non-ST elevation MI (NSTEMI) ([place]) 02/04/2014  . E. coli UTI 03/02/2014  . Renal failure, acute ([place]) 02/04/2014  . Iron deficiency anemia secondary to blood loss (chronic) 01/06/2008    Qualifier: Diagnosis of  By: Tomasa Hosteller MD, Veronique D.   . Nonsustained ventricular tachycardia ([place])   . Angiodysplasia of intestine with hemorrhage 05/13/2014  . CKD (chronic kidney disease) stage 4, GFR 15-29 ml/min (HCC) 08/21/2014   Past Surgical History  Procedure Laterality Date  . Esophagogastroduodenoscopy  08/26/2011    Procedure: ESOPHAGOGASTRODUODENOSCOPY (EGD);  Surgeon: [name], MD;  Location[facility] ENDOSCOPY;  Service:  Endoscopy;  Laterality: N/A;  . Colonoscopy  08/28/2011    Procedure: COLONOSCOPY;  Surgeon: [name], MD;  Location: [place];  Service: Endoscopy;  Laterality: N/A;  . Vaginal hysterectomy    . Tubal ligation    . Cataract extraction w/ intraocular lens  implant, bilateral    . Toe surgery      "right big toe; operated on it to straighten it out; it was under"  . Polysomnogram  10/17/2005    AHI-7.28/hr. AHI REM-20.8/hr. Average oxygen saturation range during REM and NREM was 97%. Lowest oxygen saturation during REM sleep was 90%.  . Carotid duplex  05/28/2010    No significant extracranial carotid artery stenosis demonstrated. Vertebrals are patent w/ antegrade flow.  . Cardiac catheterization  05/24/2010    No intervention - recommed medical therapy.  . Cardiovascular stress test  08/07/2006    Moderate-severe defect seen in Basal inferior, Mid inferoseptal, Mid inferior, Mid inferolateral, and Apical inferior regions - consistent w/ infarct/scar. No scintigraphic evidence of inducible myocardial ischemia.  . Transthoracic echocardiogram  08/29/2011    EF 55-60%, moderate LVH,   . Esophagogastroduodenoscopy N/A 02/08/2014    Procedure: ESOPHAGOGASTRODUODENOSCOPY (EGD);  Surgeon: [name], MD;  Location[facility] ENDOSCOPY;  Service: Endoscopy;  Laterality: N/A;  . Colonoscopy N/A 05/12/2014    Procedure: COLONOSCOPY;  Surgeon: [name], MD;  Location: [place];  Service: Endoscopy;  Laterality: N/A;  . Enteroscopy N/A 05/13/2014    Procedure: ENTEROSCOPY;  Surgeon: [name], MD;  Location: [place];  Service: Endoscopy;  Laterality: N/A;  . Enteroscopy N/A 07/13/2014    Procedure: small bowel enteroscopy;  Surgeon: Jerene Bears, MD;  Location: [facility] ENDOSCOPY;  Service: Endoscopy;  Laterality: N/A;  requests slim colonoscope   Social History:  reports that she has never smoked. She quit smokeless tobacco use about 9 years ago. Her smokeless tobacco use included Snuff.  She reports that she does not drink alcohol or use illicit drugs.  Allergies  Allergen Reactions  . Baking Soda-Fluoride [Sodium Fluoride] Nausea And Vomiting  . Magnesium Hydroxide Nausea And Vomiting   Family History  Problem Relation Age of Onset  . Diabetes insipidus Mother   . Hypertension Mother   . Hypertension Father   . Hypertension Sister   . Hypertension Child   . Stomach cancer Brother     Prior to Admission medications   Medication Sig Start Date End Date Taking? Authorizing Provider  acetaminophen (TYLENOL) 500 MG tablet Take 500 mg by mouth every 6 (six) hours as needed for moderate pain or headache.   Yes Historical Provider, MD  albuterol (PROAIR HFA) 108 (90 BASE) MCG/ACT inhaler Inhale 2 puffs into the lungs every 6 (six) hours as needed for wheezing. 05/25/14  Yes Daniel J Angiulli, PA-C  amLODipine (NORVASC) 5 MG tablet Take 1 tablet by mouth daily. 12/24/15  Yes Historical Provider, MD  aspirin 81 MG tablet Take 81 mg by mouth daily.   Yes Historical Provider, MD  atorvastatin (LIPITOR) 40 MG tablet TAKE 1 TABLET (40 MG TOTAL) BY MOUTH DAILY AT 6 PM. 11/12/15  Yes Lorretta Harp, MD  hydrALAZINE (APRESOLINE) 25 MG tablet Take 1 tablet (25 mg total) by mouth every 8 (eight) hours. 12/24/15  Yes Shanker Kristeen Mans, MD  insulin aspart (NOVOLOG FLEXPEN) 100 UNIT/ML FlexPen Inject 2-5 Units into the skin 3 (three) times daily with meals.   Yes Historical Provider, MD  insulin glargine (LANTUS) 100 UNIT/ML injection Inject 0.07 mLs (7 Units total) into the skin at bedtime. Patient taking differently: Inject 14 Units into the skin 2 (two) times daily.  11/07/15  Yes Geradine Girt, DO  isosorbide mononitrate (IMDUR) 60 MG 24 hr tablet Take 1 tablet (60 mg total) by mouth daily. 05/25/14  Yes Daniel J Angiulli, PA-C  loratadine (ALLERGY RELIEF) 10 MG tablet Take 10 mg by mouth daily. Reported on 12/07/2015   Yes Historical Provider, MD  magnesium oxide (MAG-OX) 400 (241.3 MG) MG  tablet Take 1 tablet (400 mg total) by mouth 2 (two) times daily. 05/25/14  Yes Daniel J Angiulli, PA-C  metolazone (ZAROXOLYN) 2.5 MG tablet TAKE 1 TABLET BY MOUTH DAILY AND 1 TABLET AS NEEDED WHEN WEIGHT IS OVER 152 LBS 01/11/16  Yes Eileen Stanford, PA-C  metoprolol succinate (TOPROL-XL) 25 MG 24 hr tablet Take 1 tablet by mouth daily. 12/24/15  Yes Historical Provider, MD  omeprazole (PRILOSEC) 20 MG capsule Take 1 capsule (20 mg total) by mouth daily before breakfast. 05/25/14  Yes Daniel J Angiulli, PA-C  polyethylene glycol (MIRALAX / GLYCOLAX) packet Take 17 g by mouth daily as needed for mild constipation.    Yes Historical Provider, MD  potassium chloride SA (KLOR-CON M20) 20 MEQ tablet TAKE 1 TABLET DAILY AND TAKE AN EXTRA TABLET WHEN YOU HAVE TO TAKE AN EXTRA METOLAZONE FOR WT OVER 152 LBS 01/11/16  Yes Eileen Stanford, PA-C  torsemide (DEMADEX) 20 MG tablet Take 2 tablets (40 mg total) by mouth daily. 12/24/15  Yes Shanker Kristeen Mans, MD   Physical Exam: Blood pressure 167/78, pulse 57, temperature 98.5 F (36.9 C), temperature source Oral, resp. rate 13, last menstrual period 12/19/1968, SpO2 98 %. Filed Vitals:   01/12/16 0600 01/12/16 0645  BP: 163/83 167/78  Pulse: 58 57  Temp:    Resp:  13     General:  Frail obviously chronically ill, appears comfortable  Eyes: Pupils equal round reactive to light no scleral icterus  ENT: Ears clear nose without drainage oropharynx without erythema or exudate  Neck: No JVD full range of motion no lymphadenopathy  Cardiovascular: Regular rate and rhythm trace lower extremity edema pedal pulses present and palpable  Respiratory: Mild increased work of breathing moist nonproductive cough during exam BS coarse throughout. No crackles  Abdomen: Nondistended soft positive bowel sounds nontender to palpation  Skin: Warm and dry, rashes or lesions noted  Musculoskeletal: Joints without swelling/erythema, or muscle tone  Psychiatric:  Cooperative  Neurologic: Follows commands bilateral grip 4 out of 5 moves all extremities, oriented to self and place  Labs on Admission:  Basic Metabolic Panel:  Recent Labs Lab 01/11/16 1525 01/12/16 0410  NA 138 141  K 4.0 3.8  CL 102 104  CO2 26 26  GLUCOSE 292* 254*  BUN 52* 50*  CREATININE 2.21* 2.25*  CALCIUM 8.4* 8.8*   Liver Function Tests:  Recent Labs Lab 01/12/16 0410  AST 42*  ALT 30  ALKPHOS 261*  BILITOT 1.0  PROT 7.0  ALBUMIN 3.4*   No results for input(s): LIPASE, AMYLASE in the last 168 hours. No results for input(s): AMMONIA in the last 168 hours. CBC:  Recent Labs Lab 01/11/16 1650 01/12/16 0410  WBC 3.0* 3.3*  NEUTROABS  --  1.8  HGB 10.7* 11.0*  HCT 33.4* 35.3*  MCV 84.1 84.9  PLT 226 235   Cardiac Enzymes: No results for input(s): CKTOTAL, CKMB, CKMBINDEX, TROPONINI in the last 168 hours. BNP: Invalid input(s): POCBNP CBG: No results for input(s): GLUCAP in the last 168 hours.  Radiological Exams on Admission: No results found.  EKG:   Time spent: 35 minutes  [facility]   If 7PM-7AM, please contact night-coverage [email or web address] Password [facility] 01/12/2016, 7:30 AM   Care during the described time interval was provided by Myself and NP Dyanne Carrel .  I have reviewed this patient's available data, including medical history, events of note, physical examination, and all test results as part of my evaluation. I have personally reviewed and interpreted all radiology studies.   Dia Crawford, MD [phone] Pager

## 2016-01-12 NOTE — ED Notes (Signed)
Per admitting MD, IV to be held at this time

## 2016-01-12 NOTE — ED Provider Notes (Signed)
CSN: [id number]     Arrival date & time 01/12/16  0356 History   First MD Initiated Contact with Patient 01/12/16 0522     Chief Complaint  Patient presents with  . Melena     HPI Patient has a history of coronary artery disease and congestive heart failure as well as a history of GI bleed and a history of transfusion secondary to GI bleeds who presents to the emergency department today with complaints of rectal bleeding for 2 days.  She states she still has small amount of ongoing rectal bleeding given her significant history of prior GI bleed she presents for evaluation.  She denies weakness or lightheadedness.  She reports several episodes of blood-streaked stool.  She denies hematemesis.  No nausea.  No significant abdominal pain.  Denies fevers and chills.  No shortness of breath.  Her symptoms are mild to moderate in severity.    Past Medical History  Diagnosis Date  . Asthma   . CAD (coronary artery disease)     Pt reports MI in 2006 (no documentation).  Cardiolite in 05/2002 and 07/2006 did not reveal any reversible ischemia.  Pt follows with Dr. Rex Kras at [facility].  . CHF (congestive heart failure) (HCC)     EF 25-30% with dilated LV, mild LVH, severe hypokinesis, and mod-severe reduction in RV function  . Osteoporosis   . HYPERTENSION 08/03/2006  . GASTROPARESIS, DIABETIC 08/03/2006  . HYPERLIPIDEMIA 08/03/2006  . OBSTRUCTIVE SLEEP APNEA 01/06/2008  . PERIPHERAL NEUROPATHY 08/03/2006  . GERD 08/03/2006  . LOW BACK PAIN, CHRONIC 08/03/2006  . OSTEOPOROSIS 03/21/2009  . CEREBRAL EMBOLISM, WITH INFARCTION 07/02/2010  . Angina   . Myocardial infarction ([place]) "2 or 3"  . Pneumonia 02/26/12    "a few times; probably even today"  . DIABETES MELLITUS, TYPE II 11/04/1983  . Blood transfusion     multiple in 2012, 2015.   . GI bleed 08/2011 and 01/2014  . Chronic daily headache     migraines as well.   . Stroke [facility]) summer 2011    "made my left hip worse"  . Uterine cancer ([place])   .  Nonischemic cardiomyopathy ([place]) 05/2010    Left heart catheterization:2011. Nonobstructive coronary artery disease.  . Pulmonary hypertension ([place])   . NSTEMI (non-ST elevated myocardial infarction) ([place]) 01/2014    type II with CVA  . Acute embolic stroke ([place]) 0000000  . Non-ST elevation MI (NSTEMI) ([place]) 02/04/2014  . E. coli UTI 03/02/2014  . Renal failure, acute ([place]) 02/04/2014  . Iron deficiency anemia secondary to blood loss (chronic) 01/06/2008    Qualifier: Diagnosis of  By: Tomasa Hosteller MD, Veronique D.   . Nonsustained ventricular tachycardia ([place])   . Angiodysplasia of intestine with hemorrhage 05/13/2014  . CKD (chronic kidney disease) stage 4, GFR 15-29 ml/min (HCC) 08/21/2014   Past Surgical History  Procedure Laterality Date  . Esophagogastroduodenoscopy  08/26/2011    Procedure: ESOPHAGOGASTRODUODENOSCOPY (EGD);  Surgeon: [name], MD;  Location: [facility] ENDOSCOPY;  Service: Endoscopy;  Laterality: N/A;  . Colonoscopy  08/28/2011    Procedure: COLONOSCOPY;  Surgeon: [name], MD;  Location: [place];  Service: Endoscopy;  Laterality: N/A;  . Vaginal hysterectomy    . Tubal ligation    . Cataract extraction w/ intraocular lens  implant, bilateral    . Toe surgery      "right big toe; operated on it to straighten it out; it was under"  . Polysomnogram  10/17/2005  AHI-7.28/hr. AHI REM-20.8/hr. Average oxygen saturation range during REM and NREM was 97%. Lowest oxygen saturation during REM sleep was 90%.  . Carotid duplex  05/28/2010    No significant extracranial carotid artery stenosis demonstrated. Vertebrals are patent w/ antegrade flow.  . Cardiac catheterization  05/24/2010    No intervention - recommed medical therapy.  . Cardiovascular stress test  08/07/2006    Moderate-severe defect seen in Basal inferior, Mid inferoseptal, Mid inferior, Mid inferolateral, and Apical inferior regions - consistent w/ infarct/scar. No scintigraphic evidence of inducible  myocardial ischemia.  . Transthoracic echocardiogram  08/29/2011    EF 55-60%, moderate LVH,   . Esophagogastroduodenoscopy N/A 02/08/2014    Procedure: ESOPHAGOGASTRODUODENOSCOPY (EGD);  Surgeon: [name], MD;  Location: [facility] ENDOSCOPY;  Service: Endoscopy;  Laterality: N/A;  . Colonoscopy N/A 05/12/2014    Procedure: COLONOSCOPY;  Surgeon: [name], MD;  Location: [place];  Service: Endoscopy;  Laterality: N/A;  . Enteroscopy N/A 05/13/2014    Procedure: ENTEROSCOPY;  Surgeon: [name], MD;  Location: [place];  Service: Endoscopy;  Laterality: N/A;  . Enteroscopy N/A 07/13/2014    Procedure: small bowel enteroscopy;  Surgeon: Jerene Bears, MD;  Location: [facility] ENDOSCOPY;  Service: Endoscopy;  Laterality: N/A;  requests slim colonoscope   Family History  Problem Relation Age of Onset  . Diabetes insipidus Mother   . Hypertension Mother   . Hypertension Father   . Hypertension Sister   . Hypertension Child   . Stomach cancer Brother    Social History  Substance Use Topics  . Smoking status: Never Smoker   . Smokeless tobacco: Former Systems developer    Types: Snuff    Quit date: 10/13/2006     Comment: 02/26/12 "stopped snuff 4-6 years ago"  . Alcohol Use: No   OB History    No data available     Review of Systems  All other systems reviewed and are negative.     Allergies  Baking soda-fluoride and Magnesium hydroxide  Home Medications   Prior to Admission medications   Medication Sig Start Date End Date Taking? Authorizing Provider  acetaminophen (TYLENOL) 500 MG tablet Take 500 mg by mouth every 6 (six) hours as needed for moderate pain or headache.   Yes Historical Provider, MD  albuterol (PROAIR HFA) 108 (90 BASE) MCG/ACT inhaler Inhale 2 puffs into the lungs every 6 (six) hours as needed for wheezing. 05/25/14  Yes Daniel J Angiulli, PA-C  amLODipine (NORVASC) 5 MG tablet Take 1 tablet by mouth daily. 12/24/15  Yes Historical Provider, MD  aspirin 81 MG  tablet Take 81 mg by mouth daily.   Yes Historical Provider, MD  atorvastatin (LIPITOR) 40 MG tablet TAKE 1 TABLET (40 MG TOTAL) BY MOUTH DAILY AT 6 PM. 11/12/15  Yes Lorretta Harp, MD  hydrALAZINE (APRESOLINE) 25 MG tablet Take 1 tablet (25 mg total) by mouth every 8 (eight) hours. 12/24/15  Yes Shanker Kristeen Mans, MD  insulin aspart (NOVOLOG FLEXPEN) 100 UNIT/ML FlexPen Inject 2-5 Units into the skin 3 (three) times daily with meals.   Yes Historical Provider, MD  insulin glargine (LANTUS) 100 UNIT/ML injection Inject 0.07 mLs (7 Units total) into the skin at bedtime. Patient taking differently: Inject 14 Units into the skin 2 (two) times daily.  11/07/15  Yes Geradine Girt, DO  isosorbide mononitrate (IMDUR) 60 MG 24 hr tablet Take 1 tablet (60 mg total) by mouth daily. 05/25/14  Yes Lavon Paganini Angiulli,  PA-C  loratadine (ALLERGY RELIEF) 10 MG tablet Take 10 mg by mouth daily. Reported on 12/07/2015   Yes Historical Provider, MD  magnesium oxide (MAG-OX) 400 (241.3 MG) MG tablet Take 1 tablet (400 mg total) by mouth 2 (two) times daily. 05/25/14  Yes Daniel J Angiulli, PA-C  metolazone (ZAROXOLYN) 2.5 MG tablet TAKE 1 TABLET BY MOUTH DAILY AND 1 TABLET AS NEEDED WHEN WEIGHT IS OVER 152 LBS 01/11/16  Yes Eileen Stanford, PA-C  metoprolol succinate (TOPROL-XL) 25 MG 24 hr tablet Take 1 tablet by mouth daily. 12/24/15  Yes Historical Provider, MD  omeprazole (PRILOSEC) 20 MG capsule Take 1 capsule (20 mg total) by mouth daily before breakfast. 05/25/14  Yes Daniel J Angiulli, PA-C  polyethylene glycol (MIRALAX / GLYCOLAX) packet Take 17 g by mouth daily as needed for mild constipation.    Yes Historical Provider, MD  potassium chloride SA (KLOR-CON M20) 20 MEQ tablet TAKE 1 TABLET DAILY AND TAKE AN EXTRA TABLET WHEN YOU HAVE TO TAKE AN EXTRA METOLAZONE FOR WT OVER 152 LBS 01/11/16  Yes Eileen Stanford, PA-C  torsemide (DEMADEX) 20 MG tablet Take 2 tablets (40 mg total) by mouth daily. 12/24/15  Yes Shanker  Kristeen Mans, MD   BP 163/83 mmHg  Pulse 58  Temp(Src) 98.5 F (36.9 C) (Oral)  Resp 14  SpO2 99%  LMP 12/19/1968 Physical Exam  Constitutional: She is oriented to person, place, and time. She appears well-developed and well-nourished. No distress.  HENT:  Head: Normocephalic and atraumatic.  Eyes: EOM are normal.  Neck: Normal range of motion.  Cardiovascular: Normal rate, regular rhythm and normal heart sounds.   Pulmonary/Chest: Effort normal and breath sounds normal.  Abdominal: Soft. She exhibits no distension. There is no tenderness.  Genitourinary:  Bright red blood on rectal examination  Musculoskeletal: Normal range of motion.  Neurological: She is alert and oriented to person, place, and time.  Skin: Skin is warm and dry.  Psychiatric: She has a normal mood and affect. Judgment normal.  Nursing note and vitals reviewed.   ED Course  Procedures (including critical care time) Labs Review Labs Reviewed  CBC WITH DIFFERENTIAL/PLATELET - Abnormal; Notable for the following:    WBC 3.3 (*)    Hemoglobin 11.0 (*)    HCT 35.3 (*)    RDW 16.7 (*)    All other components within normal limits  COMPREHENSIVE METABOLIC PANEL - Abnormal; Notable for the following:    Glucose, Bld 254 (*)    BUN 50 (*)    Creatinine, Ser 2.25 (*)    Calcium 8.8 (*)    Albumin 3.4 (*)    AST 42 (*)    Alkaline Phosphatase 261 (*)    GFR calc non Af Amer 19 (*)    GFR calc Af Amer 23 (*)    All other components within normal limits  SAMPLE TO BLOOD BANK   HEMOGLOBIN  Date Value Ref Range Status  01/12/2016 11.0* 12.0 - 15.0 g/dL Final  01/11/2016 10.7* 12.0 - 15.0 g/dL Final  12/31/2015 11.3* 12.0 - 15.0 g/dL Final  12/20/2015 13.9 12.0 - 15.0 g/dL Final   HGB  Date Value Ref Range Status  07/26/2015 9.9* 11.6 - 15.9 g/dL Final  05/24/2015 11.8 11.6 - 15.9 g/dL Final  04/26/2015 11.5* 11.6 - 15.9 g/dL Final        Imaging Review No results found. I have personally reviewed  and evaluated these images and lab results as part of  my medical decision-making.   EKG Interpretation None      MDM   Final diagnoses:  Lower GI bleed    Patient be admitted for serial CBCs given a lower GI bleed.  Hemodynamically stable at this time.    Jola Schmidt, MD 01/12/16 [phone]

## 2016-01-14 ENCOUNTER — Telehealth: Payer: Self-pay | Admitting: Internal Medicine

## 2016-01-14 NOTE — Telephone Encounter (Signed)
Patient is scheduled for office visit with Alonza Bogus, PA on 01/17/16 10:30.  I left a detailed message on son's cell phone about appt dates and times. [facility] phones are not working at this time, I used my personal cell phone to make call.  I did ask if they have questions to continue to try and reach the office.

## 2016-01-15 ENCOUNTER — Other Ambulatory Visit (INDEPENDENT_AMBULATORY_CARE_PROVIDER_SITE_OTHER): Payer: Commercial Managed Care - HMO

## 2016-01-15 ENCOUNTER — Telehealth: Payer: Self-pay | Admitting: Internal Medicine

## 2016-01-15 DIAGNOSIS — K625 Hemorrhage of anus and rectum: Secondary | ICD-10-CM | POA: Diagnosis not present

## 2016-01-15 LAB — CBC WITH DIFFERENTIAL/PLATELET
BASOS PCT: 0.5 % (ref 0.0–3.0)
Basophils Absolute: 0 10*3/uL (ref 0.0–0.1)
EOS PCT: 0.8 % (ref 0.0–5.0)
Eosinophils Absolute: 0 10*3/uL (ref 0.0–0.7)
HEMATOCRIT: 29.1 % — AB (ref 36.0–46.0)
Hemoglobin: 9.5 g/dL — ABNORMAL LOW (ref 12.0–15.0)
LYMPHS ABS: 1.1 10*3/uL (ref 0.7–4.0)
LYMPHS PCT: 29.3 % (ref 12.0–46.0)
MCHC: 32.6 g/dL (ref 30.0–36.0)
MCV: 84.5 fl (ref 78.0–100.0)
MONOS PCT: 10 % (ref 3.0–12.0)
Monocytes Absolute: 0.4 10*3/uL (ref 0.1–1.0)
NEUTROS ABS: 2.3 10*3/uL (ref 1.4–7.7)
NEUTROS PCT: 59.4 % (ref 43.0–77.0)
PLATELETS: 223 10*3/uL (ref 150.0–400.0)
RBC: 3.45 Mil/uL — ABNORMAL LOW (ref 3.87–5.11)
RDW: 17 % — ABNORMAL HIGH (ref 11.5–15.5)
WBC: 3.8 10*3/uL — ABNORMAL LOW (ref 4.0–10.5)

## 2016-01-15 NOTE — Telephone Encounter (Signed)
Son aware. Pt to come for labs today, order in epic. Pt scheduled to see Dr. Carlean Purl 01/16/16@11 :15am. Son to call back and confirm if they can make this appt. If not will keep APP appt that is scheduled for Thursday.

## 2016-01-15 NOTE — Progress Notes (Signed)
Quick Note:  Called results - she will see me tomorrow Told son if sig worse go back to ED ______

## 2016-01-15 NOTE — Telephone Encounter (Signed)
Ask them to have her get Hgb today (at [facility]) I am willing to see her tomorrow [phone] since I know her pretty well that may work best  Otherwise she can return to ED if it persists

## 2016-01-15 NOTE — Telephone Encounter (Signed)
Pt was seen in the ER 01/12/16 for rectal bleeding. Pt is scheduled for OV with APP 01/17/16. Son states pt is still having bleeding and some is BRB and some is very dark. States his mother's Hgb was 11 in the ER. Son is concerned because he states he checked her today and it appears she is passing a larger amount. Son wants to know what they should do until OV. Please advise.

## 2016-01-16 ENCOUNTER — Encounter (HOSPITAL_COMMUNITY): Payer: Self-pay | Admitting: General Practice

## 2016-01-16 ENCOUNTER — Inpatient Hospital Stay (HOSPITAL_COMMUNITY)
Admission: EM | Admit: 2016-01-16 | Discharge: 2016-01-22 | DRG: 377 | Disposition: A | Discharge status: Home Health | Payer: Commercial Managed Care - HMO | Attending: Internal Medicine | Admitting: Internal Medicine

## 2016-01-16 ENCOUNTER — Encounter: Payer: Self-pay | Admitting: Internal Medicine

## 2016-01-16 ENCOUNTER — Encounter (HOSPITAL_COMMUNITY): Payer: Self-pay | Admitting: Family Medicine

## 2016-01-16 ENCOUNTER — Ambulatory Visit (INDEPENDENT_AMBULATORY_CARE_PROVIDER_SITE_OTHER): Payer: Commercial Managed Care - HMO | Admitting: Internal Medicine

## 2016-01-16 ENCOUNTER — Inpatient Hospital Stay (HOSPITAL_COMMUNITY): Payer: Commercial Managed Care - HMO

## 2016-01-16 VITALS — BP 100/58 | HR 62 | Ht 62.0 in | Wt 133.0 lb

## 2016-01-16 DIAGNOSIS — K921 Melena: Secondary | ICD-10-CM | POA: Diagnosis not present

## 2016-01-16 DIAGNOSIS — N184 Chronic kidney disease, stage 4 (severe): Secondary | ICD-10-CM | POA: Diagnosis present

## 2016-01-16 DIAGNOSIS — D62 Acute posthemorrhagic anemia: Secondary | ICD-10-CM | POA: Insufficient documentation

## 2016-01-16 DIAGNOSIS — I130 Hypertensive heart and chronic kidney disease with heart failure and stage 1 through stage 4 chronic kidney disease, or unspecified chronic kidney disease: Secondary | ICD-10-CM | POA: Diagnosis present

## 2016-01-16 DIAGNOSIS — Z961 Presence of intraocular lens: Secondary | ICD-10-CM | POA: Diagnosis present

## 2016-01-16 DIAGNOSIS — K219 Gastro-esophageal reflux disease without esophagitis: Secondary | ICD-10-CM | POA: Diagnosis present

## 2016-01-16 DIAGNOSIS — Z9842 Cataract extraction status, left eye: Secondary | ICD-10-CM

## 2016-01-16 DIAGNOSIS — I2510 Atherosclerotic heart disease of native coronary artery without angina pectoris: Secondary | ICD-10-CM | POA: Diagnosis present

## 2016-01-16 DIAGNOSIS — D500 Iron deficiency anemia secondary to blood loss (chronic): Secondary | ICD-10-CM | POA: Diagnosis present

## 2016-01-16 DIAGNOSIS — E118 Type 2 diabetes mellitus with unspecified complications: Secondary | ICD-10-CM

## 2016-01-16 DIAGNOSIS — K5521 Angiodysplasia of colon with hemorrhage: Secondary | ICD-10-CM

## 2016-01-16 DIAGNOSIS — R011 Cardiac murmur, unspecified: Secondary | ICD-10-CM | POA: Diagnosis present

## 2016-01-16 DIAGNOSIS — I340 Nonrheumatic mitral (valve) insufficiency: Secondary | ICD-10-CM | POA: Diagnosis present

## 2016-01-16 DIAGNOSIS — K2961 Other gastritis with bleeding: Secondary | ICD-10-CM | POA: Diagnosis not present

## 2016-01-16 DIAGNOSIS — I428 Other cardiomyopathies: Secondary | ICD-10-CM | POA: Diagnosis present

## 2016-01-16 DIAGNOSIS — I272 Other secondary pulmonary hypertension: Secondary | ICD-10-CM | POA: Diagnosis present

## 2016-01-16 DIAGNOSIS — I5032 Chronic diastolic (congestive) heart failure: Secondary | ICD-10-CM | POA: Diagnosis present

## 2016-01-16 DIAGNOSIS — I10 Essential (primary) hypertension: Secondary | ICD-10-CM | POA: Diagnosis present

## 2016-01-16 DIAGNOSIS — G8929 Other chronic pain: Secondary | ICD-10-CM | POA: Diagnosis present

## 2016-01-16 DIAGNOSIS — N183 Chronic kidney disease, stage 3 (moderate): Secondary | ICD-10-CM | POA: Diagnosis not present

## 2016-01-16 DIAGNOSIS — F0390 Unspecified dementia without behavioral disturbance: Secondary | ICD-10-CM | POA: Diagnosis present

## 2016-01-16 DIAGNOSIS — I5043 Acute on chronic combined systolic (congestive) and diastolic (congestive) heart failure: Secondary | ICD-10-CM | POA: Diagnosis present

## 2016-01-16 DIAGNOSIS — E11649 Type 2 diabetes mellitus with hypoglycemia without coma: Secondary | ICD-10-CM | POA: Diagnosis not present

## 2016-01-16 DIAGNOSIS — Z8542 Personal history of malignant neoplasm of other parts of uterus: Secondary | ICD-10-CM | POA: Diagnosis not present

## 2016-01-16 DIAGNOSIS — E119 Type 2 diabetes mellitus without complications: Secondary | ICD-10-CM | POA: Diagnosis not present

## 2016-01-16 DIAGNOSIS — K558 Other vascular disorders of intestine: Secondary | ICD-10-CM | POA: Diagnosis not present

## 2016-01-16 DIAGNOSIS — E1143 Type 2 diabetes mellitus with diabetic autonomic (poly)neuropathy: Secondary | ICD-10-CM | POA: Diagnosis not present

## 2016-01-16 DIAGNOSIS — K922 Gastrointestinal hemorrhage, unspecified: Secondary | ICD-10-CM

## 2016-01-16 DIAGNOSIS — N390 Urinary tract infection, site not specified: Secondary | ICD-10-CM | POA: Diagnosis present

## 2016-01-16 DIAGNOSIS — I252 Old myocardial infarction: Secondary | ICD-10-CM

## 2016-01-16 DIAGNOSIS — R0989 Other specified symptoms and signs involving the circulatory and respiratory systems: Secondary | ICD-10-CM

## 2016-01-16 DIAGNOSIS — Z800 Family history of malignant neoplasm of digestive organs: Secondary | ICD-10-CM | POA: Diagnosis not present

## 2016-01-16 DIAGNOSIS — J40 Bronchitis, not specified as acute or chronic: Secondary | ICD-10-CM | POA: Diagnosis present

## 2016-01-16 DIAGNOSIS — Z8249 Family history of ischemic heart disease and other diseases of the circulatory system: Secondary | ICD-10-CM | POA: Diagnosis not present

## 2016-01-16 DIAGNOSIS — E1165 Type 2 diabetes mellitus with hyperglycemia: Secondary | ICD-10-CM | POA: Diagnosis present

## 2016-01-16 DIAGNOSIS — K5520 Angiodysplasia of colon without hemorrhage: Secondary | ICD-10-CM | POA: Diagnosis present

## 2016-01-16 DIAGNOSIS — Z794 Long term (current) use of insulin: Secondary | ICD-10-CM | POA: Diagnosis not present

## 2016-01-16 DIAGNOSIS — K3184 Gastroparesis: Secondary | ICD-10-CM | POA: Diagnosis present

## 2016-01-16 DIAGNOSIS — R413 Other amnesia: Secondary | ICD-10-CM

## 2016-01-16 DIAGNOSIS — IMO0002 Reserved for concepts with insufficient information to code with codable children: Secondary | ICD-10-CM | POA: Diagnosis present

## 2016-01-16 DIAGNOSIS — M545 Low back pain: Secondary | ICD-10-CM | POA: Diagnosis present

## 2016-01-16 DIAGNOSIS — D508 Other iron deficiency anemias: Secondary | ICD-10-CM | POA: Diagnosis not present

## 2016-01-16 DIAGNOSIS — J209 Acute bronchitis, unspecified: Secondary | ICD-10-CM | POA: Diagnosis not present

## 2016-01-16 DIAGNOSIS — E785 Hyperlipidemia, unspecified: Secondary | ICD-10-CM | POA: Diagnosis present

## 2016-01-16 DIAGNOSIS — Z8673 Personal history of transient ischemic attack (TIA), and cerebral infarction without residual deficits: Secondary | ICD-10-CM

## 2016-01-16 DIAGNOSIS — D649 Anemia, unspecified: Secondary | ICD-10-CM | POA: Diagnosis present

## 2016-01-16 DIAGNOSIS — Z9841 Cataract extraction status, right eye: Secondary | ICD-10-CM | POA: Diagnosis not present

## 2016-01-16 DIAGNOSIS — G4733 Obstructive sleep apnea (adult) (pediatric): Secondary | ICD-10-CM | POA: Diagnosis present

## 2016-01-16 DIAGNOSIS — IMO0001 Reserved for inherently not codable concepts without codable children: Secondary | ICD-10-CM | POA: Insufficient documentation

## 2016-01-16 DIAGNOSIS — J449 Chronic obstructive pulmonary disease, unspecified: Secondary | ICD-10-CM | POA: Diagnosis present

## 2016-01-16 DIAGNOSIS — Q2733 Arteriovenous malformation of digestive system vessel: Secondary | ICD-10-CM | POA: Diagnosis not present

## 2016-01-16 DIAGNOSIS — Z87891 Personal history of nicotine dependence: Secondary | ICD-10-CM

## 2016-01-16 DIAGNOSIS — Z66 Do not resuscitate: Secondary | ICD-10-CM | POA: Diagnosis not present

## 2016-01-16 DIAGNOSIS — E1122 Type 2 diabetes mellitus with diabetic chronic kidney disease: Secondary | ICD-10-CM | POA: Diagnosis present

## 2016-01-16 DIAGNOSIS — N189 Chronic kidney disease, unspecified: Secondary | ICD-10-CM

## 2016-01-16 DIAGNOSIS — Z9981 Dependence on supplemental oxygen: Secondary | ICD-10-CM

## 2016-01-16 DIAGNOSIS — R0600 Dyspnea, unspecified: Secondary | ICD-10-CM | POA: Diagnosis not present

## 2016-01-16 DIAGNOSIS — I214 Non-ST elevation (NSTEMI) myocardial infarction: Secondary | ICD-10-CM | POA: Diagnosis present

## 2016-01-16 DIAGNOSIS — I499 Cardiac arrhythmia, unspecified: Secondary | ICD-10-CM | POA: Diagnosis present

## 2016-01-16 LAB — CBC
HCT: 30.5 % — ABNORMAL LOW (ref 36.0–46.0)
Hemoglobin: 9.5 g/dL — ABNORMAL LOW (ref 12.0–15.0)
MCH: 26.3 pg (ref 26.0–34.0)
MCHC: 31.1 g/dL (ref 30.0–36.0)
MCV: 84.5 fL (ref 78.0–100.0)
PLATELETS: 240 10*3/uL (ref 150–400)
RBC: 3.61 MIL/uL — ABNORMAL LOW (ref 3.87–5.11)
RDW: 16.7 % — AB (ref 11.5–15.5)
WBC: 4.9 10*3/uL (ref 4.0–10.5)

## 2016-01-16 LAB — COMPREHENSIVE METABOLIC PANEL
ALBUMIN: 3.2 g/dL — AB (ref 3.5–5.0)
ALT: 22 U/L (ref 14–54)
AST: 25 U/L (ref 15–41)
Alkaline Phosphatase: 206 U/L — ABNORMAL HIGH (ref 38–126)
Anion gap: 8 (ref 5–15)
BILIRUBIN TOTAL: 0.7 mg/dL (ref 0.3–1.2)
BUN: 39 mg/dL — AB (ref 6–20)
CHLORIDE: 106 mmol/L (ref 101–111)
CO2: 26 mmol/L (ref 22–32)
CREATININE: 1.85 mg/dL — AB (ref 0.44–1.00)
Calcium: 9.1 mg/dL (ref 8.9–10.3)
GFR calc Af Amer: 29 mL/min — ABNORMAL LOW (ref >60)
GFR, EST NON AFRICAN AMERICAN: 25 mL/min — AB (ref >60)
GLUCOSE: 225 mg/dL — AB (ref 65–99)
POTASSIUM: 4.3 mmol/L (ref 3.5–5.1)
Sodium: 140 mmol/L (ref 135–145)
TOTAL PROTEIN: 6.7 g/dL (ref 6.5–8.1)

## 2016-01-16 LAB — PREPARE RBC (CROSSMATCH)

## 2016-01-16 LAB — GLUCOSE, CAPILLARY: GLUCOSE-CAPILLARY: 289 mg/dL — AB (ref 65–99)

## 2016-01-16 LAB — PHOSPHORUS: PHOSPHORUS: 2.2 mg/dL — AB (ref 2.5–4.6)

## 2016-01-16 LAB — APTT: APTT: 25 s (ref 24–37)

## 2016-01-16 LAB — PROTIME-INR
INR: 1.24 (ref 0.00–1.49)
Prothrombin Time: 15.8 seconds — ABNORMAL HIGH (ref 11.6–15.2)

## 2016-01-16 LAB — HEMOGLOBIN AND HEMATOCRIT, BLOOD
HCT: 29.3 % — ABNORMAL LOW (ref 36.0–46.0)
HEMOGLOBIN: 9.2 g/dL — AB (ref 12.0–15.0)

## 2016-01-16 LAB — MAGNESIUM: Magnesium: 1.7 mg/dL (ref 1.7–2.4)

## 2016-01-16 MED ORDER — ONDANSETRON HCL 4 MG/2ML IJ SOLN: 4.0000 mg | Freq: Four times a day (QID) | INTRAMUSCULAR | Status: DC | PRN

## 2016-01-16 MED ORDER — INSULIN GLARGINE 100 UNIT/ML ~~LOC~~ SOLN
7.0000 [IU] | Freq: Every day | SUBCUTANEOUS | Status: DC
  Administered 2016-01-16 – 2016-01-17 (×2): 7 [IU] via SUBCUTANEOUS
  Filled 2016-01-16 (×3): qty 0.07

## 2016-01-16 MED ORDER — OXYCODONE HCL 5 MG PO TABS: 5.0000 mg | ORAL_TABLET | ORAL | Status: DC | PRN

## 2016-01-16 MED ORDER — AMLODIPINE BESYLATE 5 MG PO TABS
5.0000 mg | ORAL_TABLET | Freq: Every day | ORAL | Status: DC
  Administered 2016-01-17 – 2016-01-22 (×5): 5 mg via ORAL
  Filled 2016-01-16 (×5): qty 1

## 2016-01-16 MED ORDER — POLYETHYLENE GLYCOL 3350 17 G PO PACK: 17.0000 g | PACK | Freq: Every day | ORAL | Status: DC | PRN

## 2016-01-16 MED ORDER — SODIUM CHLORIDE 0.45 % IV SOLN
INTRAVENOUS | Status: DC
  Administered 2016-01-16: 19:00:00 via INTRAVENOUS

## 2016-01-16 MED ORDER — MAGNESIUM OXIDE 400 (241.3 MG) MG PO TABS
400.0000 mg | ORAL_TABLET | Freq: Two times a day (BID) | ORAL | Status: DC
  Administered 2016-01-16 – 2016-01-22 (×11): 400 mg via ORAL
  Filled 2016-01-16 (×11): qty 1

## 2016-01-16 MED ORDER — ACETAMINOPHEN 325 MG PO TABS
650.0000 mg | ORAL_TABLET | Freq: Four times a day (QID) | ORAL | Status: DC | PRN
  Administered 2016-01-21: 650 mg via ORAL
  Filled 2016-01-16: qty 2

## 2016-01-16 MED ORDER — PANTOPRAZOLE SODIUM 40 MG IV SOLR
8.0000 mg/h | INTRAVENOUS | Status: DC
  Administered 2016-01-16 – 2016-01-17 (×2): 8 mg/h via INTRAVENOUS
  Filled 2016-01-16 (×6): qty 80

## 2016-01-16 MED ORDER — ONDANSETRON HCL 4 MG PO TABS: 4.0000 mg | ORAL_TABLET | Freq: Four times a day (QID) | ORAL | Status: DC | PRN

## 2016-01-16 MED ORDER — SODIUM CHLORIDE 0.9 % IV SOLN: Freq: Once | INTRAVENOUS | Status: DC

## 2016-01-16 MED ORDER — INSULIN ASPART 100 UNIT/ML ~~LOC~~ SOLN
0.0000 [IU] | Freq: Every day | SUBCUTANEOUS | Status: DC
  Administered 2016-01-17 – 2016-01-18 (×2): 2 [IU] via SUBCUTANEOUS
  Administered 2016-01-19: 3 [IU] via SUBCUTANEOUS

## 2016-01-16 MED ORDER — ALBUTEROL SULFATE HFA 108 (90 BASE) MCG/ACT IN AERS: 2.0000 | INHALATION_SPRAY | Freq: Four times a day (QID) | RESPIRATORY_TRACT | Status: DC | PRN

## 2016-01-16 MED ORDER — METOPROLOL SUCCINATE ER 25 MG PO TB24
25.0000 mg | ORAL_TABLET | Freq: Every day | ORAL | Status: DC
  Administered 2016-01-17 – 2016-01-22 (×5): 25 mg via ORAL
  Filled 2016-01-16 (×5): qty 1

## 2016-01-16 MED ORDER — METOLAZONE 2.5 MG PO TABS
2.5000 mg | ORAL_TABLET | Freq: Every day | ORAL | Status: DC | PRN
  Filled 2016-01-16: qty 1

## 2016-01-16 MED ORDER — ISOSORBIDE MONONITRATE ER 60 MG PO TB24
60.0000 mg | ORAL_TABLET | Freq: Every day | ORAL | Status: DC
Start: 2016-01-17 — End: 2016-01-22
  Administered 2016-01-17 – 2016-01-22 (×5): 60 mg via ORAL
  Filled 2016-01-16 (×5): qty 1

## 2016-01-16 MED ORDER — ATORVASTATIN CALCIUM 40 MG PO TABS: 40.0000 mg | ORAL_TABLET | Freq: Every day | ORAL | Status: DC

## 2016-01-16 MED ORDER — ATORVASTATIN CALCIUM 40 MG PO TABS
40.0000 mg | ORAL_TABLET | Freq: Every day | ORAL | Status: DC
  Administered 2016-01-17 – 2016-01-21 (×4): 40 mg via ORAL
  Filled 2016-01-16 (×4): qty 1

## 2016-01-16 MED ORDER — ALBUTEROL SULFATE (2.5 MG/3ML) 0.083% IN NEBU
2.5000 mg | INHALATION_SOLUTION | Freq: Four times a day (QID) | RESPIRATORY_TRACT | Status: DC | PRN
  Administered 2016-01-16: 2.5 mg via RESPIRATORY_TRACT
  Filled 2016-01-16: qty 3

## 2016-01-16 MED ORDER — PANTOPRAZOLE SODIUM 40 MG IV SOLR: 40.0000 mg | Freq: Two times a day (BID) | INTRAVENOUS | Status: DC

## 2016-01-16 MED ORDER — TORSEMIDE 20 MG PO TABS
40.0000 mg | ORAL_TABLET | Freq: Every day | ORAL | Status: DC
  Administered 2016-01-17 – 2016-01-22 (×5): 40 mg via ORAL
  Filled 2016-01-16 (×5): qty 2

## 2016-01-16 MED ORDER — ACETAMINOPHEN 650 MG RE SUPP: 650.0000 mg | Freq: Four times a day (QID) | RECTAL | Status: DC | PRN

## 2016-01-16 MED ORDER — HYDRALAZINE HCL 25 MG PO TABS
25.0000 mg | ORAL_TABLET | Freq: Three times a day (TID) | ORAL | Status: DC
  Administered 2016-01-16 – 2016-01-22 (×17): 25 mg via ORAL
  Filled 2016-01-16 (×17): qty 1

## 2016-01-16 MED ORDER — INSULIN ASPART 100 UNIT/ML ~~LOC~~ SOLN
0.0000 [IU] | Freq: Three times a day (TID) | SUBCUTANEOUS | Status: DC
  Administered 2016-01-16: 5 [IU] via SUBCUTANEOUS
  Administered 2016-01-17: 3 [IU] via SUBCUTANEOUS
  Administered 2016-01-18: 7 [IU] via SUBCUTANEOUS
  Administered 2016-01-18: 1 [IU] via SUBCUTANEOUS
  Administered 2016-01-19: 3 [IU] via SUBCUTANEOUS
  Administered 2016-01-19: 2 [IU] via SUBCUTANEOUS
  Administered 2016-01-19: 3 [IU] via SUBCUTANEOUS
  Administered 2016-01-20: 5 [IU] via SUBCUTANEOUS
  Administered 2016-01-20: 3 [IU] via SUBCUTANEOUS
  Administered 2016-01-20: 9 [IU] via SUBCUTANEOUS
  Administered 2016-01-21: 1 [IU] via SUBCUTANEOUS
  Administered 2016-01-21: 3 [IU] via SUBCUTANEOUS
  Administered 2016-01-21 – 2016-01-22 (×2): 2 [IU] via SUBCUTANEOUS

## 2016-01-16 MED ORDER — PANTOPRAZOLE SODIUM 40 MG IV SOLR
80.0000 mg | Freq: Once | INTRAVENOUS | Status: AC
  Administered 2016-01-16: 80 mg via INTRAVENOUS
  Filled 2016-01-16 (×2): qty 80

## 2016-01-16 MED ORDER — ASPIRIN EC 81 MG PO TBEC
81.0000 mg | DELAYED_RELEASE_TABLET | Freq: Every day | ORAL | Status: DC
  Administered 2016-01-17 – 2016-01-22 (×4): 81 mg via ORAL
  Filled 2016-01-16 (×4): qty 1

## 2016-01-16 NOTE — ED Notes (Signed)
Report attempted 

## 2016-01-16 NOTE — Consult Note (Signed)
Consultation  Referring Provider: ER MD Primary Care Physician:  [name], MD Primary Gastroenterologist:  Dr. Carlean Purl Reason for Consultation:  GI bleed  HPI: [name] is a 82 y.o. female   Who was seen earlier today at our office by Dr Carlean Purl and sent to ER for admission. Patient has multiple comorbidities including coronary artery disease status post MI in 2015, history of CVA 2015, congestive heart failure with EF of 20-25%, and adult-onset diabetes mellitus insulin-dependent. Patient was seen in the ER, for observation admission on 01/12/2016 after she had presented with maroon stool. At that time her hemoglobin was in the 11.5 range and remained stable for several hours, she was allowed discharged home and told to follow up with GI this week. When seen in the office earlier today she had continued to have dark maroonish stools one to 2 daily and complained of progressive weakness and exertional dyspnea. She has no complaints of abdominal pain, no nausea or vomiting. Her family relate she's also had a "cold" over the past couple of weeks and has been short of breath and coughing , and bringing up yellow sputum. No fever that they are aware of. Hemoccult and today down to 9.5. She is hemodynamically stable. She is on a baby aspirin once daily no other blood thinners and no NSAIDs. Patient has history of bleeding felt secondary to small bowel AVMs. She had undergone EGD with an ablation of a proximal jejunal AVM in August 2015. She had another enteroscopy in October 2015 with ablation of proximal jejunal AVM. Enteroscopy at that time otherwise negative. Colonoscopy in July 2015 was normal, no diverticulosis noted. Previous notes mention possibility of referral to tertiary care center for deep enteroscopy. She has not had capsule endoscopy.   Past Medical History  Diagnosis Date  . Asthma   . CAD (coronary artery disease)     Pt reports MI in 2006 (no documentation).  Cardiolite in  05/2002 and 07/2006 did not reveal any reversible ischemia.  Pt follows with [facility]. Rex Kras at [facility].  . CHF (congestive heart failure) (HCC)     EF 25-30% with dilated LV, mild LVH, severe hypokinesis, and mod-severe reduction in RV function  . Osteoporosis   . HYPERTENSION 08/03/2006  . GASTROPARESIS, DIABETIC 08/03/2006  . HYPERLIPIDEMIA 08/03/2006  . OBSTRUCTIVE SLEEP APNEA 01/06/2008  . PERIPHERAL NEUROPATHY 08/03/2006  . GERD 08/03/2006  . LOW BACK PAIN, CHRONIC 08/03/2006  . OSTEOPOROSIS 03/21/2009  . CEREBRAL EMBOLISM, WITH INFARCTION 07/02/2010  . Angina   . Myocardial infarction ([place]) "2 or 3"  . Pneumonia 02/26/12    "a few times; probably even today"  . DIABETES MELLITUS, TYPE II 11/04/1983  . Blood transfusion     multiple in 2012, 2015.   . GI bleed 08/2011 and 01/2014  . Chronic daily headache     migraines as well.   . Stroke [facility]) summer 2011    "made my left hip worse"  . Uterine cancer ([place])   . Nonischemic cardiomyopathy ([place]) 05/2010    Left heart catheterization:2011. Nonobstructive coronary artery disease.  . Pulmonary hypertension ([place])   . NSTEMI (non-ST elevated myocardial infarction) ([place]) 01/2014    type II with CVA  . Acute embolic stroke ([place]) 0000000  . Non-ST elevation MI (NSTEMI) ([place]) 02/04/2014  . E. coli UTI 03/02/2014  . Renal failure, acute ([place]) 02/04/2014  . Iron deficiency anemia secondary to blood loss (chronic) 01/06/2008    Qualifier: Diagnosis of  By:  Tomasa Hosteller MD, Veronique D.   . Nonsustained ventricular tachycardia ([place])   . Angiodysplasia of intestine with hemorrhage 05/13/2014  . CKD (chronic kidney disease) stage 4, GFR 15-29 ml/min (HCC) 08/21/2014    Past Surgical History  Procedure Laterality Date  . Esophagogastroduodenoscopy  08/26/2011    Procedure: ESOPHAGOGASTRODUODENOSCOPY (EGD);  Surgeon: [name], MD;  Location: [facility] ENDOSCOPY;  Service: Endoscopy;  Laterality: N/A;  . Colonoscopy  08/28/2011    Procedure: COLONOSCOPY;   Surgeon: [name], MD;  Location: [place];  Service: Endoscopy;  Laterality: N/A;  . Vaginal hysterectomy    . Tubal ligation    . Cataract extraction w/ intraocular lens  implant, bilateral    . Toe surgery      "right big toe; operated on it to straighten it out; it was under"  . Polysomnogram  10/17/2005    AHI-7.28/hr. AHI REM-20.8/hr. Average oxygen saturation range during REM and NREM was 97%. Lowest oxygen saturation during REM sleep was 90%.  . Carotid duplex  05/28/2010    No significant extracranial carotid artery stenosis demonstrated. Vertebrals are patent w/ antegrade flow.  . Cardiac catheterization  05/24/2010    No intervention - recommed medical therapy.  . Cardiovascular stress test  08/07/2006    Moderate-severe defect seen in Basal inferior, Mid inferoseptal, Mid inferior, Mid inferolateral, and Apical inferior regions - consistent w/ infarct/scar. No scintigraphic evidence of inducible myocardial ischemia.  . Transthoracic echocardiogram  08/29/2011    EF 55-60%, moderate LVH,   . Esophagogastroduodenoscopy N/A 02/08/2014    Procedure: ESOPHAGOGASTRODUODENOSCOPY (EGD);  Surgeon: [name], MD;  Location: [facility] ENDOSCOPY;  Service: Endoscopy;  Laterality: N/A;  . Colonoscopy N/A 05/12/2014    Procedure: COLONOSCOPY;  Surgeon: [name], MD;  Location: [place];  Service: Endoscopy;  Laterality: N/A;  . Enteroscopy N/A 05/13/2014    Procedure: ENTEROSCOPY;  Surgeon: [name], MD;  Location: [place];  Service: Endoscopy;  Laterality: N/A;  . Enteroscopy N/A 07/13/2014    Procedure: small bowel enteroscopy;  Surgeon: Jerene Bears, MD;  Location: [facility] ENDOSCOPY;  Service: Endoscopy;  Laterality: N/A;  requests slim colonoscope    Prior to Admission medications   Medication Sig Start Date End Date Taking? Authorizing Provider  acetaminophen (TYLENOL) 500 MG tablet Take 500 mg by mouth every 6 (six) hours as needed for moderate pain or headache.     Historical Provider, MD  albuterol (PROAIR HFA) 108 (90 BASE) MCG/ACT inhaler Inhale 2 puffs into the lungs every 6 (six) hours as needed for wheezing. 05/25/14   Lavon Paganini Angiulli, PA-C  amLODipine (NORVASC) 5 MG tablet Take 1 tablet by mouth daily. 12/24/15   Historical Provider, MD  aspirin 81 MG tablet Take 81 mg by mouth daily.    Historical Provider, MD  atorvastatin (LIPITOR) 40 MG tablet TAKE 1 TABLET (40 MG TOTAL) BY MOUTH DAILY AT 6 PM. 11/12/15   Lorretta Harp, MD  hydrALAZINE (APRESOLINE) 25 MG tablet Take 1 tablet (25 mg total) by mouth every 8 (eight) hours. 12/24/15   Shanker Kristeen Mans, MD  insulin aspart (NOVOLOG FLEXPEN) 100 UNIT/ML FlexPen Inject 2-5 Units into the skin 3 (three) times daily with meals.    Historical Provider, MD  insulin glargine (LANTUS) 100 UNIT/ML injection Inject 0.07 mLs (7 Units total) into the skin at bedtime. Patient taking differently: Inject 14 Units into the skin 2 (two) times daily.  11/07/15   Geradine Girt, DO  isosorbide  mononitrate (IMDUR) 60 MG 24 hr tablet Take 1 tablet (60 mg total) by mouth daily. 05/25/14   Lavon Paganini Angiulli, PA-C  loratadine (ALLERGY RELIEF) 10 MG tablet Take 10 mg by mouth daily. Reported on 12/07/2015    Historical Provider, MD  magnesium oxide (MAG-OX) 400 (241.3 MG) MG tablet Take 1 tablet (400 mg total) by mouth 2 (two) times daily. 05/25/14   Lavon Paganini Angiulli, PA-C  metolazone (ZAROXOLYN) 2.5 MG tablet TAKE 1 TABLET BY MOUTH DAILY AND 1 TABLET AS NEEDED WHEN WEIGHT IS OVER 152 LBS 01/11/16   Eileen Stanford, PA-C  metoprolol succinate (TOPROL-XL) 25 MG 24 hr tablet Take 1 tablet by mouth daily. 12/24/15   Historical Provider, MD  omeprazole (PRILOSEC) 20 MG capsule Take 1 capsule (20 mg total) by mouth 2 (two) times daily before a meal. 01/12/16   Allie Bossier, MD  polyethylene glycol [facility] / Floria Raveling) packet Take 17 g by mouth daily as needed for mild constipation.     Historical Provider, MD  potassium chloride SA  (KLOR-CON M20) 20 MEQ tablet TAKE 1 TABLET DAILY AND TAKE AN EXTRA TABLET WHEN YOU HAVE TO TAKE AN EXTRA METOLAZONE FOR WT OVER 152 LBS 01/11/16   Eileen Stanford, PA-C  torsemide (DEMADEX) 20 MG tablet Take 2 tablets (40 mg total) by mouth daily. 12/24/15   Shanker Kristeen Mans, MD    No current facility-administered medications for this encounter.   Current Outpatient Prescriptions  Medication Sig Dispense Refill  . acetaminophen (TYLENOL) 500 MG tablet Take 500 mg by mouth every 6 (six) hours as needed for moderate pain or headache.    . albuterol (PROAIR HFA) 108 (90 BASE) MCG/ACT inhaler Inhale 2 puffs into the lungs every 6 (six) hours as needed for wheezing. 8.5 g 11  . amLODipine (NORVASC) 5 MG tablet Take 1 tablet by mouth daily.    Marland Kitchen aspirin 81 MG tablet Take 81 mg by mouth daily.    Marland Kitchen atorvastatin (LIPITOR) 40 MG tablet TAKE 1 TABLET (40 MG TOTAL) BY MOUTH DAILY AT 6 PM. 30 tablet 10  . hydrALAZINE (APRESOLINE) 25 MG tablet Take 1 tablet (25 mg total) by mouth every 8 (eight) hours. 90 tablet 0  . insulin aspart (NOVOLOG FLEXPEN) 100 UNIT/ML FlexPen Inject 2-5 Units into the skin 3 (three) times daily with meals.    . insulin glargine (LANTUS) 100 UNIT/ML injection Inject 0.07 mLs (7 Units total) into the skin at bedtime. (Patient taking differently: Inject 14 Units into the skin 2 (two) times daily. ) 10 mL 11  . isosorbide mononitrate (IMDUR) 60 MG 24 hr tablet Take 1 tablet (60 mg total) by mouth daily. 30 tablet 2  . loratadine (ALLERGY RELIEF) 10 MG tablet Take 10 mg by mouth daily. Reported on 12/07/2015    . magnesium oxide (MAG-OX) 400 (241.3 MG) MG tablet Take 1 tablet (400 mg total) by mouth 2 (two) times daily. 60 tablet 1  . metolazone (ZAROXOLYN) 2.5 MG tablet TAKE 1 TABLET BY MOUTH DAILY AND 1 TABLET AS NEEDED WHEN WEIGHT IS OVER 152 LBS 30 tablet 0  . metoprolol succinate (TOPROL-XL) 25 MG 24 hr tablet Take 1 tablet by mouth daily.    Marland Kitchen omeprazole (PRILOSEC) 20 MG capsule  Take 1 capsule (20 mg total) by mouth 2 (two) times daily before a meal. 30 capsule 11  . polyethylene glycol (MIRALAX / GLYCOLAX) packet Take 17 g by mouth daily as needed for mild constipation.     Marland Kitchen  potassium chloride SA (KLOR-CON M20) 20 MEQ tablet TAKE 1 TABLET DAILY AND TAKE AN EXTRA TABLET WHEN YOU HAVE TO TAKE AN EXTRA METOLAZONE FOR WT OVER 152 LBS 45 tablet 3  . torsemide (DEMADEX) 20 MG tablet Take 2 tablets (40 mg total) by mouth daily. 60 tablet 0    Allergies as of 01/16/2016 - Review Complete 01/16/2016  Allergen Reaction Noted  . Baking soda-fluoride [sodium fluoride] Nausea And Vomiting 03/28/2011  . Magnesium hydroxide Nausea And Vomiting 08/28/2011    Family History  Problem Relation Age of Onset  . Diabetes insipidus Mother   . Hypertension Mother   . Hypertension Father   . Hypertension Sister   . Hypertension Child   . Stomach cancer Brother     Social History   Social History  . Marital Status: Widowed    Spouse Name: N/A  . Number of Children: 7  . Years of Education: N/A   Occupational History  . Disabled    Social History Main Topics  . Smoking status: Never Smoker   . Smokeless tobacco: Former Systems developer    Types: Snuff    Quit date: 10/13/2006     Comment: 02/26/12 "stopped snuff 4-6 years ago"  . Alcohol Use: No  . Drug Use: No  . Sexual Activity: No   Other Topics Concern  . Not on file   Social History Narrative   ** Merged History Encounter **       Lives with daughter    Review of Systems: Pertinent positive and negative review of systems were noted in the above HPI section.  All other review of systems was otherwise negative.Marland Kitchen  Physical Exam: Vital signs in last 24 hours: Temp:  [98.2 F (36.8 C)] 98.2 F (36.8 C) (04/05 1238) Pulse Rate:  [62-74] 74 (04/05 1238) Resp:  [20] 20 (04/05 1238) BP: (100-139)/(58-80) 139/80 mmHg (04/05 1238) SpO2:  [99 %] 99 % (04/05 1238) Weight:  [133 lb (60.328 kg)] 133 lb (60.328 kg) (04/05  1111)   General:   Alert,  Well-developed, elderly AA female , pleasant and cooperative in NAD Head:  Normocephalic and atraumatic. Eyes:  Sclera clear, no icterus.   Conjunctiva pale  Ears:  Normal auditory acuity. Nose:  No deformity, discharge,  or lesions. Mouth:  No deformity or lesions.   Neck:  Supple; no masses or thyromegaly. Lungs:   Decreased BS bilaterally, rales bilateral  bases few rhonchi Heart:  Regular rate and rhythm; no murmurs, clicks, rubs,  or gallops. Abdomen:  Soft,nontender, BS active,nonpalp mass or hsm.   Rectal:  Deferred - done in office today ,dark maroon stool Msk:  Symmetrical without gross deformities. . Pulses:  Normal pulses noted. Extremities:  Without clubbing or edema. Neurologic:  Alert and  oriented x4;  grossly normal neurologically. Skin:  Intact without significant lesions or rashes.. Psych:  Alert and cooperative. Normal mood and affect.  Intake/Output from previous day:   Intake/Output this shift:    Lab Results:  Recent Labs  01/15/16 1542 01/16/16 1245  WBC 3.8* 4.9  HGB 9.5* 9.5*  HCT 29.1* 30.5*  PLT 223.0 240   BMET  Recent Labs  01/16/16 1245  NA 140  K 4.3  CL 106  CO2 26  GLUCOSE 225*  BUN 39*  CREATININE 1.85*  CALCIUM 9.1   LFT  Recent Labs  01/16/16 1245  PROT 6.7  ALBUMIN 3.2*  AST 25  ALT 22  ALKPHOS 206*  BILITOT 0.7   PT/INR  No results for input(s): LABPROT, INR in the last 72 hours.   IMPRESSION:   #19 82 year old African-American female with history of previous GI bleeds felt secondary to AVMs here She has undergone enteroscopy with ablation of proximal jejunal AVM 2 with last bleeding episode in October 2015. Now presents with several day history of  GI bleeding with maroonish and dark stool and drop in hemoglobin of 2 g over the past 4 days. Suspect bleeding secondary to AVMs #2 anemia normocytic secondary to acute blood loss/superimposed on chronic iron deficiency anemia #3 congestive  heart failure-EF 20-25%-question current volume overload #4 coronary artery disease status post MI 2015 #5 history of CVA #6 insulin-dependent diabetes mellitus #7 obstructive sleep apnea #8 history of hypertension #9 upper respiratory infection-rule out pneumonia  PLAN: Patient will be admitted to the hospitalist service Clear liquid diet IV PPI once daily Serial hemoglobins and transfuse for hemoglobin 8 or less- she is symptomatic at this point with hemoglobin of 9.5 Chest x-ray and further pulmonary valve per hospitalist. We will plan for EGD and enteroscopy tomorrow with Dr. Loletha Carrow, if respiratory status stable/improved We will follow with you   Tjuana Vickrey  01/16/2016, 4:03 PM

## 2016-01-16 NOTE — Progress Notes (Signed)
NURSING PROGRESS NOTE  EMALEY BILLE [id number] Admission Data: 01/16/2016 8:12 PM Attending Provider: Waldemar Dickens, MD [id number] Elyse Hsu, MD Code Status: FULL  [name] is a 82 y.o. female patient admitted from ED:  -No acute distress noted.  -No complaints of shortness of breath.  -No complaints of chest pain.   Cardiac Monitoring: Box # 27 in place.   Blood pressure 157/66, pulse 66, temperature 97.6 F (36.4 C), temperature source Oral, resp. rate 20, last menstrual period 12/19/1968, SpO2 94 %.   IV Fluids:  IV in place, occlusive dsg intact without redness, IV cath forearm right, condition patent and no redness   Allergies:  Baking soda-fluoride and Magnesium hydroxide  Past Medical History:   has a past medical history of Asthma; CAD (coronary artery disease); CHF (congestive heart failure) ([place]); Osteoporosis; HYPERTENSION (08/03/2006); GASTROPARESIS, DIABETIC (08/03/2006); HYPERLIPIDEMIA (08/03/2006); OBSTRUCTIVE SLEEP APNEA (01/06/2008); PERIPHERAL NEUROPATHY (08/03/2006); GERD (08/03/2006); LOW BACK PAIN, CHRONIC (08/03/2006); OSTEOPOROSIS (03/21/2009); CEREBRAL EMBOLISM, WITH INFARCTION (07/02/2010); Angina; Myocardial infarction ([place]) ("2 or 3"); Pneumonia (02/26/12); DIABETES MELLITUS, TYPE II (11/04/1983); Blood transfusion; GI bleed (08/2011 and 01/2014); Chronic daily headache; Stroke [facility]) (summer 2011); Uterine cancer ([place]); Nonischemic cardiomyopathy ([place]) (05/2010); Pulmonary hypertension ([place]); NSTEMI (non-ST elevated myocardial infarction) ([place]) (01/2014); Acute embolic stroke ([place]) (0000000); Non-ST elevation MI (NSTEMI) ([place]) (02/04/2014); E. coli UTI (03/02/2014); Renal failure, acute ([place]) (02/04/2014); Iron deficiency anemia secondary to blood loss (chronic) (01/06/2008); Nonsustained ventricular tachycardia ([place]); Angiodysplasia of intestine with hemorrhage (05/13/2014); and CKD (chronic kidney disease) stage 4, GFR 15-29 ml/min (HCC) (08/21/2014).  Past Surgical  History:   has past surgical history that includes Esophagogastroduodenoscopy (08/26/2011); Colonoscopy (08/28/2011); Vaginal hysterectomy; Tubal ligation; Cataract extraction w/ intraocular lens  implant, bilateral; Toe Surgery; POLYSOMNOGRAM (10/17/2005); CAROTID DUPLEX (05/28/2010); Cardiac catheterization (05/24/2010); Cardiovascular stress test (08/07/2006); transthoracic echocardiogram (08/29/2011); Esophagogastroduodenoscopy (N/A, 02/08/2014); Colonoscopy (N/A, 05/12/2014); enteroscopy (N/A, 05/13/2014); and enteroscopy (N/A, 07/13/2014).  Social History:   reports that she has never smoked. She quit smokeless tobacco use about 9 years ago. Her smokeless tobacco use included Snuff. She reports that she does not drink alcohol or use illicit drugs.  Skin: WDL  Patient/Family orientated to room. Information packet given to patient/family. Admission inpatient armband information verified with patient/family to include name and date of birth and placed on patient arm. Side rails up x 2, fall assessment and education completed with patient/family. Patient/family able to verbalize understanding of risk associated with falls and verbalized understanding to call for assistance before getting out of bed. Call light within reach. Patient/family able to voice and demonstrate understanding of unit orientation instructions.    Will continue to evaluate and treat per MD orders.

## 2016-01-16 NOTE — ED Notes (Signed)
Pt here with continued rectal bleeding over the past week. sts was recently here for the same and increasingly getting weaker. sts HA.

## 2016-01-16 NOTE — Progress Notes (Signed)
Patient experiencing SOB with coughing, constantly sitting up to breathe. Inspiratory and expiratory wheezes auscultated. Put on 2L via nasal cannula. Patient continues to experience difficulty, respirations now 24. RT called to administer PRN albuterol neb.  Will continue to monitor.

## 2016-01-16 NOTE — Patient Instructions (Signed)
  Please go to [facility] ED now to be admitted.     I appreciate the opportunity to care for you.

## 2016-01-16 NOTE — ED Notes (Signed)
Pt prefers to take night medications with dinner

## 2016-01-16 NOTE — ED Provider Notes (Signed)
CSN: [id number]     Arrival date & time 01/16/16  1221 History   First MD Initiated Contact with Patient 01/16/16 1603     Chief Complaint  Patient presents with  . Rectal Bleeding     (Consider location/radiation/quality/duration/timing/severity/associated sxs/prior Treatment) HPI Comments: Patient is an 82 year old female with history of diabetes, congestive heart failure, chronic renal insufficiency, and angiodysplasia of the intestine. She presents for evaluation of melena/rectal bleeding. The patient reports having maroon and dark colored stools for the past several days. Her hemoglobin has dropped to 9.5. She was evaluated in the gastroenterology office by Dr. Carlean Purl and sent here as he feel she requires admission. Patient denies fevers, chills, abdominal pain, lightheadedness, or dizziness.  Patient is a 82 y.o. female presenting with hematochezia. The history is provided by the patient.  Rectal Bleeding Quality:  Black and tarry and maroon Amount:  Moderate Duration:  4 days Timing:  Intermittent Progression:  Worsening Chronicity:  New Relieved by:  Nothing Worsened by:  Nothing tried Ineffective treatments:  None tried Associated symptoms: no abdominal pain, no dizziness and no fever   Risk factors: no anticoagulant use     Past Medical History  Diagnosis Date  . Asthma   . CAD (coronary artery disease)     Pt reports MI in 2006 (no documentation).  Cardiolite in 05/2002 and 07/2006 did not reveal any reversible ischemia.  Pt follows with Dr. Rex Kras at [facility].  . CHF (congestive heart failure) (HCC)     EF 25-30% with dilated LV, mild LVH, severe hypokinesis, and mod-severe reduction in RV function  . Osteoporosis   . HYPERTENSION 08/03/2006  . GASTROPARESIS, DIABETIC 08/03/2006  . HYPERLIPIDEMIA 08/03/2006  . OBSTRUCTIVE SLEEP APNEA 01/06/2008  . PERIPHERAL NEUROPATHY 08/03/2006  . GERD 08/03/2006  . LOW BACK PAIN, CHRONIC 08/03/2006  . OSTEOPOROSIS 03/21/2009  .  CEREBRAL EMBOLISM, WITH INFARCTION 07/02/2010  . Angina   . Myocardial infarction ([place]) "2 or 3"  . Pneumonia 02/26/12    "a few times; probably even today"  . DIABETES MELLITUS, TYPE II 11/04/1983  . Blood transfusion     multiple in 2012, 2015.   . GI bleed 08/2011 and 01/2014  . Chronic daily headache     migraines as well.   . Stroke [facility]) summer 2011    "made my left hip worse"  . Uterine cancer ([place])   . Nonischemic cardiomyopathy ([place]) 05/2010    Left heart catheterization:2011. Nonobstructive coronary artery disease.  . Pulmonary hypertension ([place])   . NSTEMI (non-ST elevated myocardial infarction) ([place]) 01/2014    type II with CVA  . Acute embolic stroke ([place]) 0000000  . Non-ST elevation MI (NSTEMI) ([place]) 02/04/2014  . E. coli UTI 03/02/2014  . Renal failure, acute ([place]) 02/04/2014  . Iron deficiency anemia secondary to blood loss (chronic) 01/06/2008    Qualifier: Diagnosis of  By: Tomasa Hosteller MD, Veronique D.   . Nonsustained ventricular tachycardia ([place])   . Angiodysplasia of intestine with hemorrhage 05/13/2014  . CKD (chronic kidney disease) stage 4, GFR 15-29 ml/min (HCC) 08/21/2014   Past Surgical History  Procedure Laterality Date  . Esophagogastroduodenoscopy  08/26/2011    Procedure: ESOPHAGOGASTRODUODENOSCOPY (EGD);  Surgeon: [name], MD;  Location: [facility] ENDOSCOPY;  Service: Endoscopy;  Laterality: N/A;  . Colonoscopy  08/28/2011    Procedure: COLONOSCOPY;  Surgeon: [name], MD;  Location: [place];  Service: Endoscopy;  Laterality: N/A;  . Vaginal hysterectomy    . Tubal ligation    .  Cataract extraction w/ intraocular lens  implant, bilateral    . Toe surgery      "right big toe; operated on it to straighten it out; it was under"  . Polysomnogram  10/17/2005    AHI-7.28/hr. AHI REM-20.8/hr. Average oxygen saturation range during REM and NREM was 97%. Lowest oxygen saturation during REM sleep was 90%.  . Carotid duplex  05/28/2010    No significant  extracranial carotid artery stenosis demonstrated. Vertebrals are patent w/ antegrade flow.  . Cardiac catheterization  05/24/2010    No intervention - recommed medical therapy.  . Cardiovascular stress test  08/07/2006    Moderate-severe defect seen in Basal inferior, Mid inferoseptal, Mid inferior, Mid inferolateral, and Apical inferior regions - consistent w/ infarct/scar. No scintigraphic evidence of inducible myocardial ischemia.  . Transthoracic echocardiogram  08/29/2011    EF 55-60%, moderate LVH,   . Esophagogastroduodenoscopy N/A 02/08/2014    Procedure: ESOPHAGOGASTRODUODENOSCOPY (EGD);  Surgeon: [name], MD;  Location: [facility] ENDOSCOPY;  Service: Endoscopy;  Laterality: N/A;  . Colonoscopy N/A 05/12/2014    Procedure: COLONOSCOPY;  Surgeon: [name], MD;  Location: [place];  Service: Endoscopy;  Laterality: N/A;  . Enteroscopy N/A 05/13/2014    Procedure: ENTEROSCOPY;  Surgeon: [name], MD;  Location: [place];  Service: Endoscopy;  Laterality: N/A;  . Enteroscopy N/A 07/13/2014    Procedure: small bowel enteroscopy;  Surgeon: Jerene Bears, MD;  Location: [facility] ENDOSCOPY;  Service: Endoscopy;  Laterality: N/A;  requests slim colonoscope   Family History  Problem Relation Age of Onset  . Diabetes insipidus Mother   . Hypertension Mother   . Hypertension Father   . Hypertension Sister   . Hypertension Child   . Stomach cancer Brother    Social History  Substance Use Topics  . Smoking status: Never Smoker   . Smokeless tobacco: Former Systems developer    Types: Snuff    Quit date: 10/13/2006     Comment: 02/26/12 "stopped snuff 4-6 years ago"  . Alcohol Use: No   OB History    No data available     Review of Systems  Constitutional: Negative for fever.  Gastrointestinal: Positive for hematochezia. Negative for abdominal pain.  Neurological: Negative for dizziness.  All other systems reviewed and are negative.     Allergies  Baking soda-fluoride and  Magnesium hydroxide  Home Medications   Prior to Admission medications   Medication Sig Start Date End Date Taking? Authorizing Provider  acetaminophen (TYLENOL) 500 MG tablet Take 500 mg by mouth every 6 (six) hours as needed for moderate pain or headache.    Historical Provider, MD  albuterol (PROAIR HFA) 108 (90 BASE) MCG/ACT inhaler Inhale 2 puffs into the lungs every 6 (six) hours as needed for wheezing. 05/25/14   Lavon Paganini Angiulli, PA-C  amLODipine (NORVASC) 5 MG tablet Take 1 tablet by mouth daily. 12/24/15   Historical Provider, MD  aspirin 81 MG tablet Take 81 mg by mouth daily.    Historical Provider, MD  atorvastatin (LIPITOR) 40 MG tablet TAKE 1 TABLET (40 MG TOTAL) BY MOUTH DAILY AT 6 PM. 11/12/15   Lorretta Harp, MD  hydrALAZINE (APRESOLINE) 25 MG tablet Take 1 tablet (25 mg total) by mouth every 8 (eight) hours. 12/24/15   Shanker Kristeen Mans, MD  insulin aspart (NOVOLOG FLEXPEN) 100 UNIT/ML FlexPen Inject 2-5 Units into the skin 3 (three) times daily with meals.    Historical Provider, MD  insulin glargine (LANTUS)  100 UNIT/ML injection Inject 0.07 mLs (7 Units total) into the skin at bedtime. Patient taking differently: Inject 14 Units into the skin 2 (two) times daily.  11/07/15   Geradine Girt, DO  isosorbide mononitrate (IMDUR) 60 MG 24 hr tablet Take 1 tablet (60 mg total) by mouth daily. 05/25/14   Lavon Paganini Angiulli, PA-C  loratadine (ALLERGY RELIEF) 10 MG tablet Take 10 mg by mouth daily. Reported on 12/07/2015    Historical Provider, MD  magnesium oxide (MAG-OX) 400 (241.3 MG) MG tablet Take 1 tablet (400 mg total) by mouth 2 (two) times daily. 05/25/14   Lavon Paganini Angiulli, PA-C  metolazone (ZAROXOLYN) 2.5 MG tablet TAKE 1 TABLET BY MOUTH DAILY AND 1 TABLET AS NEEDED WHEN WEIGHT IS OVER 152 LBS 01/11/16   Eileen Stanford, PA-C  metoprolol succinate (TOPROL-XL) 25 MG 24 hr tablet Take 1 tablet by mouth daily. 12/24/15   Historical Provider, MD  omeprazole (PRILOSEC) 20 MG  capsule Take 1 capsule (20 mg total) by mouth 2 (two) times daily before a meal. 01/12/16   Allie Bossier, MD  polyethylene glycol [facility] / Floria Raveling) packet Take 17 g by mouth daily as needed for mild constipation.     Historical Provider, MD  potassium chloride SA (KLOR-CON M20) 20 MEQ tablet TAKE 1 TABLET DAILY AND TAKE AN EXTRA TABLET WHEN YOU HAVE TO TAKE AN EXTRA METOLAZONE FOR WT OVER 152 LBS 01/11/16   Eileen Stanford, PA-C  torsemide (DEMADEX) 20 MG tablet Take 2 tablets (40 mg total) by mouth daily. 12/24/15   Shanker Kristeen Mans, MD   BP 146/70 mmHg  Pulse 64  Temp(Src) 98.1 F (36.7 C) (Oral)  Resp 18  SpO2 100%  LMP 12/19/1968 Physical Exam  Constitutional: She is oriented to person, place, and time. She appears well-developed and well-nourished. No distress.  HENT:  Head: Normocephalic and atraumatic.  Neck: Normal range of motion. Neck supple.  Cardiovascular: Normal rate and regular rhythm.  Exam reveals no gallop and no friction rub.   No murmur heard. Pulmonary/Chest: Effort normal and breath sounds normal. No respiratory distress. She has no wheezes.  Abdominal: Soft. Bowel sounds are normal. She exhibits no distension. There is no tenderness.  Musculoskeletal: Normal range of motion.  Neurological: She is alert and oriented to person, place, and time.  Skin: Skin is warm and dry. She is not diaphoretic.  Nursing note and vitals reviewed.   ED Course  Procedures (including critical care time) Labs Review Labs Reviewed  COMPREHENSIVE METABOLIC PANEL - Abnormal; Notable for the following:    Glucose, Bld 225 (*)    BUN 39 (*)    Creatinine, Ser 1.85 (*)    Albumin 3.2 (*)    Alkaline Phosphatase 206 (*)    GFR calc non Af Amer 25 (*)    GFR calc Af Amer 29 (*)    All other components within normal limits  CBC - Abnormal; Notable for the following:    RBC 3.61 (*)    Hemoglobin 9.5 (*)    HCT 30.5 (*)    RDW 16.7 (*)    All other components within normal  limits  POC OCCULT BLOOD, ED    Imaging Review No results found. I have personally reviewed and evaluated these images and lab results as part of my medical decision-making.   EKG Interpretation None      MDM   Final diagnoses:  None    Patient to be admitted to  the hospitalist service under the care of Dr. Barbaraann Faster. Gastroenterology is here evaluating the patient as well.    Veryl Speak, MD 01/16/16 [phone]

## 2016-01-16 NOTE — Progress Notes (Signed)
   Subjective:    Patient ID: [name], female    DOB: Aug 10, 1933, 82 y.o.   MRN: [id number] Chief complaint: Rectal bleeding dark stools HPI Is a very nice 82 year old African-American woman here with her son. She was in the emergency department with rectal bleeding on March 31 and released on April 1. Hemoglobin was 10.7 and then 11.0 which is really at baseline. Since then she has had persistent dark stools with some red blood. 2 in the last 24 hours according to her son. She does not have any chest pain, there is mild abdominal cramping she says. She has a history of known AVMs of the small bowel status post treatment of these in the past and bleeding from them. Last evaluation was 2015 Glennon Mac had 2 small bowel endoscopies in August and October that year with ablation of jejunal AVMs. Since that time she's been relatively stable and managed by hematology with iron supplementation. She does take an 81 mg aspirin daily.  Medications, allergies, past medical history, past surgical history, family history and social history are reviewed and updated in the EMR.   Review of Systems She is complaining of a mild headache Mildly weak     Objective:   Physical Exam @BP  100/58 mmHg  Pulse 62  Ht 5\' 2"  (1.575 m)  Wt 133 lb (60.328 kg)  BMI 24.32 kg/m2  LMP 12/19/1968@  General:  NADElderly no acute distress but chronically ill Eyes:   anicteric Lungs:  Coarse breath sounds with scattered wheezes Heart:: S1S2 no rubs, murmurs or gallops Abdomen:  soft and nontender, BS+ Rectal exam with female staff present is demonstrates blood in the stool, she has maroon and melenic stool mixed together nontender rectal exam Ext:   no edema, cyanosis or clubbing    Data Reviewed:   CBC Latest Ref Rng 01/15/2016 01/12/2016 01/12/2016  WBC 4.0 - 10.5 K/uL 3.8(L) 3.7(L) 3.3(L)  Hemoglobin 12.0 - 15.0 g/dL 9.5(L) 11.4(L) 11.0(L)  Hematocrit 36.0 - 46.0 % 29.1(L) 35.6(L) 35.3(L)  Platelets 150.0 - 400.0  K/uL 223.0 228 235       Assessment & Plan:   1. Acute GI bleeding   2. Angiodysplasia of intestine with hemorrhage suspected   3. Acute blood loss anemia    Given the clinical course and the presence of blood in the stool, declining hemoglobin I think she needs an admission. She is probably bleeding from angiodysplasia again. Spoke to Dr. Barbaraann Faster of [facility] and we decided to send patient to ED as quickest way to admit her given high census.  I will let my team know to find her.

## 2016-01-16 NOTE — H&P (Signed)
[facility] History and Physical  [name] [id number] DOB: 1932/12/14 DOA: 01/16/2016  Referring physician:  PCP: [name], MD   Chief Complaint:  HPI: [name] is a 82 y.o. female with a history of CAD, prior CVA, CHF withEF 20-25% , DM2 insulin dependent, with a history of AVM  In 8/l 2015 requiring ablation, and another endoscopy in Oct 2015 to the proxima jejunal AVM with good results. She was seen at hospital  01/12/16 for evaluation of dark stools for which she was to continue workup as OP. It was noted that there hemoglobin was 11.5 at the time however, today, a new hemoglobin was reported to be 9.5. Because of that Dr. Carlean Purl felt prudent at the patient be admitted for further evaluation and workup, which would include upper endoscopy on 01/17/2016. Per daughter's report, she continues to have maroon stools, which is suspicious for recurrent AVMs. She denies anynausea or vomiting. She denies any abdominal pain.She denies any shortness of breath, but she does have intermittent cough, which versus report, has been present for about 2 weeks, following a palpation cold".she is asymptomatic for anemia, feeling very weak. At the ED, her vital signs are stable, and she is afebrile. Chest x-ray is in progress. Creatinine 1.85. Rest of the CMET and CBC is unremarkable.  Review of Systems:  See HPI for significant positives. All other systems were reviewed and are negative.  Past Medical History  Diagnosis Date  . Asthma   . CAD (coronary artery disease)     Pt reports MI in 2006 (no documentation).  Cardiolite in 05/2002 and 07/2006 did not reveal any reversible ischemia.  Pt follows with Dr. Rex Kras at [facility].  . CHF (congestive heart failure) (HCC)     EF 25-30% with dilated LV, mild LVH, severe hypokinesis, and mod-severe reduction in RV function  . Osteoporosis   . HYPERTENSION 08/03/2006  . GASTROPARESIS, DIABETIC 08/03/2006  . HYPERLIPIDEMIA 08/03/2006  .  OBSTRUCTIVE SLEEP APNEA 01/06/2008  . PERIPHERAL NEUROPATHY 08/03/2006  . GERD 08/03/2006  . LOW BACK PAIN, CHRONIC 08/03/2006  . OSTEOPOROSIS 03/21/2009  . CEREBRAL EMBOLISM, WITH INFARCTION 07/02/2010  . Angina   . Myocardial infarction ([place]) "2 or 3"  . Pneumonia 02/26/12    "a few times; probably even today"  . DIABETES MELLITUS, TYPE II 11/04/1983  . Blood transfusion     multiple in 2012, 2015.   . GI bleed 08/2011 and 01/2014  . Chronic daily headache     migraines as well.   . Stroke [facility]) summer 2011    "made my left hip worse"  . Uterine cancer ([place])   . Nonischemic cardiomyopathy ([place]) 05/2010    Left heart catheterization:2011. Nonobstructive coronary artery disease.  . Pulmonary hypertension ([place])   . NSTEMI (non-ST elevated myocardial infarction) ([place]) 01/2014    type II with CVA  . Acute embolic stroke ([place]) 0000000  . Non-ST elevation MI (NSTEMI) ([place]) 02/04/2014  . E. coli UTI 03/02/2014  . Renal failure, acute ([place]) 02/04/2014  . Iron deficiency anemia secondary to blood loss (chronic) 01/06/2008    Qualifier: Diagnosis of  By: Tomasa Hosteller MD, Veronique D.   . Nonsustained ventricular tachycardia ([place])   . Angiodysplasia of intestine with hemorrhage 05/13/2014  . CKD (chronic kidney disease) stage 4, GFR 15-29 ml/min (HCC) 08/21/2014   Past Surgical History  Procedure Laterality Date  . Esophagogastroduodenoscopy  08/26/2011    Procedure: ESOPHAGOGASTRODUODENOSCOPY (EGD);  Surgeon: [name], MD;  Location:  [facility];  Service: Endoscopy;  Laterality: N/A;  . Colonoscopy  08/28/2011    Procedure: COLONOSCOPY;  Surgeon: [name], MD;  Location: [place];  Service: Endoscopy;  Laterality: N/A;  . Vaginal hysterectomy    . Tubal ligation    . Cataract extraction w/ intraocular lens  implant, bilateral    . Toe surgery      "right big toe; operated on it to straighten it out; it was under"  . Polysomnogram  10/17/2005    AHI-7.28/hr. AHI REM-20.8/hr. Average  oxygen saturation range during REM and NREM was 97%. Lowest oxygen saturation during REM sleep was 90%.  . Carotid duplex  05/28/2010    No significant extracranial carotid artery stenosis demonstrated. Vertebrals are patent w/ antegrade flow.  . Cardiac catheterization  05/24/2010    No intervention - recommed medical therapy.  . Cardiovascular stress test  08/07/2006    Moderate-severe defect seen in Basal inferior, Mid inferoseptal, Mid inferior, Mid inferolateral, and Apical inferior regions - consistent w/ infarct/scar. No scintigraphic evidence of inducible myocardial ischemia.  . Transthoracic echocardiogram  08/29/2011    EF 55-60%, moderate LVH,   . Esophagogastroduodenoscopy N/A 02/08/2014    Procedure: ESOPHAGOGASTRODUODENOSCOPY (EGD);  Surgeon: [name], MD;  Location: [facility];  Service: Endoscopy;  Laterality: N/A;  . Colonoscopy N/A 05/12/2014    Procedure: COLONOSCOPY;  Surgeon: [name], MD;  Location: [place];  Service: Endoscopy;  Laterality: N/A;  . Enteroscopy N/A 05/13/2014    Procedure: ENTEROSCOPY;  Surgeon: [name], MD;  Location: [place];  Service: Endoscopy;  Laterality: N/A;  . Enteroscopy N/A 07/13/2014    Procedure: small bowel enteroscopy;  Surgeon: Jerene Bears, MD;  Location: [facility] END[facility]COPY;  Service: Endoscopy;  Laterality: N/A;  requests slim colonoscope   Social History:  reports that she has never smoked. She quit smokeless tobacco use about 9 years ago. Her smokeless tobacco use included Snuff. She reports that she does not drink alcohol or use illicit drugs.  Allergies  Allergen Reactions  . Baking Soda-Fluoride [Sodium Fluoride] Nausea And Vomiting  . Magnesium Hydroxide Nausea And Vomiting    Family History  Problem Relation Age of Onset  . Diabetes insipidus Mother   . Hypertension Mother   . Hypertension Father   . Hypertension Sister   . Hypertension Child   . Stomach cancer Brother      Prior to Admission  medications   Medication Sig Start Date End Date Taking? Authorizing Provider  acetaminophen (TYLENOL) 500 MG tablet Take 500 mg by mouth every 6 (six) hours as needed for moderate pain or headache.    Historical Provider, MD  albuterol (PROAIR HFA) 108 (90 BASE) MCG/ACT inhaler Inhale 2 puffs into the lungs every 6 (six) hours as needed for wheezing. 05/25/14   Lavon Paganini Angiulli, PA-C  amLODipine (NORVASC) 5 MG tablet Take 1 tablet by mouth daily. 12/24/15   Historical Provider, MD  aspirin 81 MG tablet Take 81 mg by mouth daily.    Historical Provider, MD  atorvastatin (LIPITOR) 40 MG tablet TAKE 1 TABLET (40 MG TOTAL) BY MOUTH DAILY AT 6 PM. 11/12/15   Lorretta Harp, MD  hydrALAZINE (APRESOLINE) 25 MG tablet Take 1 tablet (25 mg total) by mouth every 8 (eight) hours. 12/24/15   Shanker Kristeen Mans, MD  insulin aspart (NOVOLOG FLEXPEN) 100 UNIT/ML FlexPen Inject 2-5 Units into the skin 3 (three) times daily with meals.    Historical Provider, MD  insulin glargine (LANTUS) 100 UNIT/ML injection Inject 0.07 mLs (7 Units total) into the skin at bedtime. Patient taking differently: Inject 14 Units into the skin 2 (two) times daily.  11/07/15   Geradine Girt, DO  isosorbide mononitrate (IMDUR) 60 MG 24 hr tablet Take 1 tablet (60 mg total) by mouth daily. 05/25/14   Lavon Paganini Angiulli, PA-C  loratadine (ALLERGY RELIEF) 10 MG tablet Take 10 mg by mouth daily. Reported on 12/07/2015    Historical Provider, MD  magnesium oxide (MAG-OX) 400 (241.3 MG) MG tablet Take 1 tablet (400 mg total) by mouth 2 (two) times daily. 05/25/14   Lavon Paganini Angiulli, PA-C  metolazone (ZAROXOLYN) 2.5 MG tablet TAKE 1 TABLET BY MOUTH DAILY AND 1 TABLET AS NEEDED WHEN WEIGHT IS OVER 152 LBS 01/11/16   Eileen Stanford, PA-C  metoprolol succinate (TOPROL-XL) 25 MG 24 hr tablet Take 1 tablet by mouth daily. 12/24/15   Historical Provider, MD  omeprazole (PRILOSEC) 20 MG capsule Take 1 capsule (20 mg total) by mouth 2 (two) times daily  before a meal. 01/12/16   Allie Bossier, MD  polyethylene glycol [facility] / Floria Raveling) packet Take 17 g by mouth daily as needed for mild constipation.     Historical Provider, MD  potassium chloride SA (KLOR-CON M20) 20 MEQ tablet TAKE 1 TABLET DAILY AND TAKE AN EXTRA TABLET WHEN YOU HAVE TO TAKE AN EXTRA METOLAZONE FOR WT OVER 152 LBS 01/11/16   Eileen Stanford, PA-C  torsemide (DEMADEX) 20 MG tablet Take 2 tablets (40 mg total) by mouth daily. 12/24/15   Shanker Kristeen Mans, MD   Physical Exam: Filed Vitals:   01/16/16 1608 01/16/16 1615 01/16/16 1630 01/16/16 1645  BP: 142/64 154/65 144/64 153/70  Pulse: 60 61 62 63  Temp:      TempSrc:      Resp: 22 25 20 13   SpO2: 100% 100% 100% 100%    Wt Readings from Last 3 Encounters:  01/16/16 60.328 kg (133 lb)  01/11/16 68.221 kg (150 lb 6.4 oz)  12/31/15 69.854 kg (154 lb)    General: Appears calm and comfortable Eyes:  PERRL, EOMI, normal lids, iris ENT: grossly normal hearing, lips & tongue Neck: no lymphadenopathy, masses or thyromegaly Cardiovascular: regular rate and rythm, no murmurs, rubs or gallops. No lower extremity edema   Respiratory bilateral crackles, no rhonchi, no wheezing. Normal respiratory effort. Abdomen: soft,non-tender, normal bowel sounds Skin: no rash or induration seen on limited exam. No open lesions. Musculoskeletal:  grossly normal tone in both upper and lower extremities Psychiatric: grossly normal mood and affect, speech fluent and appropriate Neurologic: CN 2-12 grossly intact, moves all extremities in coordinated fashion.          Labs on Admission:  Basic Metabolic Panel:  Recent Labs Lab 01/11/16 1525 01/12/16 0410 01/16/16 1245  NA 138 141 140  K 4.0 3.8 4.3  CL 102 104 106  CO2 26 26 26   GLUCOSE 292* 254* 225*  BUN 52* 50* 39*  CREATININE 2.21* 2.25* 1.85*  CALCIUM 8.4* 8.8* 9.1    Liver Function Tests:  Recent Labs Lab 01/12/16 0410 01/16/16 1245  AST 42* 25  ALT 30 22    ALKPHOS 261* 206*  BILITOT 1.0 0.7  PROT 7.0 6.7  ALBUMIN 3.4* 3.2*   No results for input(s): LIPASE, AMYLASE in the last 168 hours. No results for input(s): AMMONIA in the last 168 hours.  CBC:  Recent Labs Lab 01/11/16 1650 01/12/16  0410 01/12/16 0950 01/15/16 1542 01/16/16 1245  WBC 3.0* 3.3* 3.7* 3.8* 4.9  NEUTROABS  --  1.8  --  2.3  --   HGB 10.7* 11.0* 11.4* 9.5* 9.5*  HCT 33.4* 35.3* 35.6* 29.1* 30.5*  MCV 84.1 84.9 85.0 84.5 84.5  PLT 226 235 228 223.0 240    Cardiac Enzymes: No results for input(s): CKTOTAL, CKMB, CKMBINDEX, TROPONINI in the last 168 hours.  BNP (last 3 results)  Recent Labs  04/04/15 1100 11/04/15 1350 12/20/15 1736  BNP 1383.6* 1841.7* 1957.6*    ProBNP (last 3 results) No results for input(s): PROBNP in the last 8760 hours.   CREATININE: 1.85 mg/dL ABNORMAL (01/16/16 1245) Estimated creatinine clearance - 20.8 mL/min  CBG:  Recent Labs Lab 01/12/16 1313  GLUCAP 258*    Radiological Exams on Admission: No results found.  EKG: Independently reviewed.    Assessment/Plan Principal Problem:   GI bleed Active Problems:   DM (diabetes mellitus), type 2, uncontrolled (HCC)   Iron deficiency anemia secondary to blood loss (chronic)   Essential hypertension   Chronic diastolic congestive heart failure (HCC)   GERD   CAD (coronary artery disease)   Irregular heart rhythm   NSTEMI (non-ST elevated myocardial infarction) (HCC)   Anemia   Acute on chronic combined systolic and diastolic heart failure (HCC)   CKD (chronic kidney disease)  GI bleed: Hgb 9.5. Baseline 12. Pt sent to [facility] from Dr. Celesta Aver office due to concern for active bleed. Recent melena and hematochezia. This appears to be recurrent A M. - Tele  IV PPI once daily Clear liquid diet. Nothing by mouth after midnight, planning EGD and enteroscopy tomorrow by Dr. Loletha Carrow   History of OSA, bilateral rales on exam. Patient is afebrile Check CXR rule out  any infiltrate   Anemia, acute on chronic, in the setting of GI bleed. She is somewhat symptomatic. Current hemoglobin is 9.5. Serious hemoglobins, and transfuse for hemoglobin of 8 or less.  Type II Diabetes Hold home oral diabetic medications.  SSI Heart healthy carb modified diet, NPO after midnight   HTN: Hypertensive on admission  BP 153/70 mmHg  Pulse 63  Temp(Src) 98.1 F (36.7 C) (Oral)  Resp 13  SpO2 100%  LMP 12/19/1968 - continue metoprolol, norvasc, Hydralazine, Imdur :  Combined systolic and diastolic CHFLast echo showing EF of 20% and grade 2 diastolic dysfunction. No evidence of acute decompensation.  - continue bblocker, Imdur, metolazone, Torsemide  DM:   Last A1C 11/05/15 9.8  Current blood sugar level is 225 - continue lantus - SSI  CKD, Stage 4: Cr 1.85. Baseline 2. - IVF - BMET in am  GERD: - continue PPI as above  H/o NSTEMI: - continue ASA, statin  Deconditioning  PT/OT   Code Status: Full Code   DVT Prophylaxis: Heparin Family Communication:  Family at bedside Disposition Plan: Pending Improvement. Admitted for observation in tele bed. Expected LOS 24-48 hrs    [facility] E,PA-C [facility] [email or web address] Password [email or web address]

## 2016-01-17 ENCOUNTER — Ambulatory Visit: Payer: Self-pay | Admitting: Gastroenterology

## 2016-01-17 ENCOUNTER — Encounter (HOSPITAL_COMMUNITY)
Admission: EM | Disposition: A | Discharge status: Home Health | Payer: Self-pay | Source: Home / Self Care | Attending: Internal Medicine

## 2016-01-17 DIAGNOSIS — R0600 Dyspnea, unspecified: Secondary | ICD-10-CM

## 2016-01-17 DIAGNOSIS — K2961 Other gastritis with bleeding: Secondary | ICD-10-CM

## 2016-01-17 DIAGNOSIS — I340 Nonrheumatic mitral (valve) insufficiency: Secondary | ICD-10-CM

## 2016-01-17 DIAGNOSIS — I2510 Atherosclerotic heart disease of native coronary artery without angina pectoris: Secondary | ICD-10-CM

## 2016-01-17 DIAGNOSIS — I272 Other secondary pulmonary hypertension: Secondary | ICD-10-CM

## 2016-01-17 LAB — URINALYSIS, ROUTINE W REFLEX MICROSCOPIC
BILIRUBIN URINE: NEGATIVE
GLUCOSE, UA: NEGATIVE mg/dL
KETONES UR: NEGATIVE mg/dL
Nitrite: POSITIVE — AB
PH: 7 (ref 5.0–8.0)
PROTEIN: NEGATIVE mg/dL
Specific Gravity, Urine: 1.008 (ref 1.005–1.030)

## 2016-01-17 LAB — COMPREHENSIVE METABOLIC PANEL
ALBUMIN: 2.9 g/dL — AB (ref 3.5–5.0)
ALK PHOS: 180 U/L — AB (ref 38–126)
ALT: 18 U/L (ref 14–54)
ANION GAP: 9 (ref 5–15)
AST: 19 U/L (ref 15–41)
BILIRUBIN TOTAL: 0.7 mg/dL (ref 0.3–1.2)
BUN: 35 mg/dL — ABNORMAL HIGH (ref 6–20)
CALCIUM: 8.9 mg/dL (ref 8.9–10.3)
CO2: 26 mmol/L (ref 22–32)
Chloride: 106 mmol/L (ref 101–111)
Creatinine, Ser: 1.71 mg/dL — ABNORMAL HIGH (ref 0.44–1.00)
GFR calc Af Amer: 31 mL/min — ABNORMAL LOW (ref >60)
GFR, EST NON AFRICAN AMERICAN: 27 mL/min — AB (ref >60)
GLUCOSE: 122 mg/dL — AB (ref 65–99)
Potassium: 3.8 mmol/L (ref 3.5–5.1)
Sodium: 141 mmol/L (ref 135–145)
TOTAL PROTEIN: 6.1 g/dL — AB (ref 6.5–8.1)

## 2016-01-17 LAB — CBC
HEMATOCRIT: 29 % — AB (ref 36.0–46.0)
HEMOGLOBIN: 9.3 g/dL — AB (ref 12.0–15.0)
MCH: 27 pg (ref 26.0–34.0)
MCHC: 32.1 g/dL (ref 30.0–36.0)
MCV: 84.3 fL (ref 78.0–100.0)
Platelets: 282 10*3/uL (ref 150–400)
RBC: 3.44 MIL/uL — ABNORMAL LOW (ref 3.87–5.11)
RDW: 16.8 % — ABNORMAL HIGH (ref 11.5–15.5)
WBC: 4.4 10*3/uL (ref 4.0–10.5)

## 2016-01-17 LAB — HEMOGLOBIN AND HEMATOCRIT, BLOOD
HCT: 30 % — ABNORMAL LOW (ref 36.0–46.0)
HEMATOCRIT: 28.9 % — AB (ref 36.0–46.0)
HEMATOCRIT: 27.3 % — AB (ref 36.0–46.0)
Hemoglobin: 9.1 g/dL — ABNORMAL LOW (ref 12.0–15.0)
Hemoglobin: 9.4 g/dL — ABNORMAL LOW (ref 12.0–15.0)
Hemoglobin: 8.5 g/dL — ABNORMAL LOW (ref 12.0–15.0)

## 2016-01-17 LAB — URINE MICROSCOPIC-ADD ON

## 2016-01-17 LAB — GLUCOSE, CAPILLARY
GLUCOSE-CAPILLARY: 104 mg/dL — AB (ref 65–99)
GLUCOSE-CAPILLARY: 119 mg/dL — AB (ref 65–99)
GLUCOSE-CAPILLARY: 166 mg/dL — AB (ref 65–99)
GLUCOSE-CAPILLARY: 220 mg/dL — AB (ref 65–99)
GLUCOSE-CAPILLARY: 45 mg/dL — AB (ref 65–99)
Glucose-Capillary: 183 mg/dL — ABNORMAL HIGH (ref 65–99)

## 2016-01-17 LAB — CREATININE, URINE, RANDOM: Creatinine, Urine: 20.49 mg/dL

## 2016-01-17 SURGERY — EGD (ESOPHAGOGASTRODUODENOSCOPY)
Anesthesia: Moderate Sedation

## 2016-01-17 MED ORDER — DEXTROSE 50 % IV SOLN
25.0000 mL | INTRAVENOUS | Status: AC
  Administered 2016-01-17: 25 mL via INTRAVENOUS

## 2016-01-17 MED ORDER — DEXTROSE 5 % IV SOLN
1.0000 g | INTRAVENOUS | Status: DC
  Administered 2016-01-17 – 2016-01-19 (×3): 1 g via INTRAVENOUS
  Filled 2016-01-17 (×4): qty 10

## 2016-01-17 MED ORDER — DEXTROSE 50 % IV SOLN
INTRAVENOUS | Status: AC
  Filled 2016-01-17: qty 50

## 2016-01-17 MED ORDER — FUROSEMIDE 10 MG/ML IJ SOLN
40.0000 mg | Freq: Once | INTRAMUSCULAR | Status: AC
  Administered 2016-01-17: 40 mg via INTRAVENOUS
  Filled 2016-01-17: qty 4

## 2016-01-17 MED ORDER — PANTOPRAZOLE SODIUM 40 MG IV SOLR
40.0000 mg | Freq: Two times a day (BID) | INTRAVENOUS | Status: DC
  Administered 2016-01-17 – 2016-01-22 (×9): 40 mg via INTRAVENOUS
  Filled 2016-01-17 (×9): qty 40

## 2016-01-17 MED ORDER — DEXTROSE-NACL 5-0.9 % IV SOLN: INTRAVENOUS | Status: DC

## 2016-01-17 NOTE — Progress Notes (Addendum)
Patient ID: [name], female   DOB: 1932/12/14, 82 y.o.   MRN: [id number]  [facility] PROGRESS NOTE  [name] [id number] DOB: 1933-03-10 DOA: 01/16/2016 PCP: [name], MD   Brief narrative:    81 y.o. 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 She was seen at hospital 01/12/16 for evaluation of dark stools for which she was to continue workup as OP. It was noted that hemoglobin was 11.5 at that time however, this admission, hemoglobin was 9.3. GI consulted but deferred scope due to rhonchi on lung exam and [facility] consulted cardiology.   Assessment/Plan:    Acute blood loss anemia, GI bleed, Angiodysplasia of intestine with suspected hemorrhage  - GI consulted, scoping on hold for now until heart issues resolved  - on PPI already - appreciate GI team following   Chronic Combined systolic and diastolic congestive heart failure (HCC) - Current medications BB, IMDUR, Demadex - Lasix 40mg  x1 dose given this morning, monitor I&O - weight is 65.9 kg this AM - appreciate cardiology team following   ? UTI - UA and urine culture requested as pt with dysuria and malodorous urine - placed on empiric Rocephin - d/c if culture negative   DM (diabetes mellitus), type 2, uncontrolled ([place]) - continue Lantus and SSI for now  Essential hypertension - reasonable inpatient controlled  CAD (coronary artery disease) - asymptomatic   CKD (chronic kidney disease) - improving - BMP in AM  DVT prophylaxis - SCD's  Code Status: Full.  Family Communication:  plan of care discussed with the patient Disposition Plan: Home when stable.   IV access:  Peripheral IV  Procedures and diagnostic studies:    X-ray Chest Pa And Lateral  01/16/2016  CLINICAL DATA:  History of rectal bleeding, congestive heart  failure, diabetes, chronic renal disease EXAM: CHEST  2 VIEW COMPARISON:  Chest x-ray of 01/12/2016 FINDINGS: No pneumonia is seen. There is moderate pulmonary vascular congestion however with moderate cardiomegaly. Tiny pleural effusions cannot be excluded. The thoracic vertebrae are normal alignment. IMPRESSION: Cardiomegaly mild pulmonary vascular congestion. Electronically Signed   By: Ivar Drape M.D.   On: 01/16/2016 17:20   Dg Chest 2 View  01/12/2016  CLINICAL DATA:  Pulmonary edema EXAM: CHEST  2 VIEW COMPARISON:  12/20/2015 FINDINGS: The heart is moderately enlarged. Mild diffuse pulmonary edema. Tiny pleural effusions. No pneumothorax. IMPRESSION: Mild CHF.  Tiny pleural effusions. Electronically Signed   By: Marybelle Killings M.D.   On: 01/12/2016 11:45   Dg Chest 2 View  12/20/2015  CLINICAL DATA:  For 3 years patient has been severely SOB. Patient states she can not remember a lot. HX CHF, CAD, HTN, Diabetes, myocardial infarction, pneumonia, stroke 2011, cardiac catheterization EXAM: CHEST  2 VIEW COMPARISON:  11/04/2015 FINDINGS: Heart size is markedly enlarged but stable in appearance. Interstitial edema again noted. Small right pleural effusion. There are no focal consolidations. Visualized osseous structures have a normal appearance. IMPRESSION: Stable marked enlargement of the heart/pericardial silhouette. CHF/interstitial edema and small right effusion. Electronically Signed   By: Nolon Nations M.D.   On: 12/20/2015 17:55    Medical Consultants:  GI Cardiology   Other Consultants:  None  IAnti-Infectives:   Ceftriaxone 4/6 -->  Faye Ramsay, MD  [facility] Pager [phone]  If 7PM-7AM,  please contact night-coverage [email or web address] Password [email or web address] 01/17/2016, 7:31 PM   LOS: 1 day   HPI/Subjective: No events overnight.   Objective: Filed Vitals:   01/17/16 0715 01/17/16 1033 01/17/16 1435 01/17/16 1435  BP: 132/72 139/57 137/55   Pulse: 66 68  63  Temp: 98.3 F (36.8 C)    98.6 F (37 C)  TempSrc: Oral   Oral  Resp:    18  Height:      Weight:      SpO2:    100%    Intake/Output Summary (Last 24 hours) at 01/17/16 1931 Last data filed at 01/17/16 1500  Gross per 24 hour  Intake 1339.17 ml  Output      0 ml  Net 1339.17 ml    Exam:   General:  Pt is alert, follows commands appropriately, not in acute distress  Cardiovascular: Regular rate and rhythm,  no rubs, no gallops  Respiratory: Clear to auscultation bilaterally, rhonchi scattered   Abdomen: Soft, non tender, non distended, bowel sounds present, no guarding  Extremities: pulses DP and PT palpable bilaterally  Neuro: Grossly nonfocal  Data Reviewed: Basic Metabolic Panel:  Recent Labs Lab 01/11/16 1525 01/12/16 0410 01/16/16 1245 01/16/16 2122 01/17/16 0154  NA 138 141 140  --  141  K 4.0 3.8 4.3  --  3.8  CL 102 104 106  --  106  CO2 26 26 26   --  26  GLUCOSE 292* 254* 225*  --  122*  BUN 52* 50* 39*  --  35*  CREATININE 2.21* 2.25* 1.85*  --  1.71*  CALCIUM 8.4* 8.8* 9.1  --  8.9  MG  --   --   --  1.7  --   PHOS  --   --   --  2.2*  --    Liver Function Tests:  Recent Labs Lab 01/12/16 0410 01/16/16 1245 01/17/16 0154  AST 42* 25 19  ALT 30 22 18   ALKPHOS 261* 206* 180*  BILITOT 1.0 0.7 0.7  PROT 7.0 6.7 6.1*  ALBUMIN 3.4* 3.2* 2.9*   CBC:  Recent Labs Lab 01/12/16 0410 01/12/16 0950 01/15/16 1542 01/16/16 1245 01/16/16 2122 01/17/16 0144 01/17/16 0154 01/17/16 0924 01/17/16 1714  WBC 3.3* 3.7* 3.8* 4.9  --   --  4.4  --   --   NEUTROABS 1.8  --  2.3  --   --   --   --   --   --   HGB 11.0* 11.4* 9.5* 9.5* 9.2* 9.1* 9.3* 9.4* 8.5*  HCT 35.3* 35.6* 29.1* 30.5* 29.3* 28.9* 29.0* 30.0* 27.3*  MCV 84.9 85.0 84.5 84.5  --   --  84.3  --   --   PLT 235 228 223.0 240  --   --  282  --   --    CBG:  Recent Labs Lab 01/17/16 01/17/16 0820 01/17/16 0910 01/17/16 1156 01/17/16 1636  GLUCAP 166* 45* 104* 119* 183*   Scheduled Meds: .  amLODipine  5 mg Oral Daily  . aspirin EC  81 mg Oral Daily  . atorvastatin  40 mg Oral q1800  . cefTRIAXone  IV  1 g Intravenous Q24H  . hydrALAZINE  25 mg Oral 3 times per day  . insulin aspart  0-5 Units Subcutaneous QHS  . insulin aspart  0-9 Units Subcutaneous TID WC  . insulin glargine  7 Units Subcutaneous QHS  . isosorbide mononitrate  60 mg Oral Daily  . magnesium  oxide  400 mg Oral BID  . metoprolol succinate  25 mg Oral Daily  . pantoprazole  IV  40 mg Intravenous Q12H  . torsemide  40 mg Oral Daily   Continuous Infusions:

## 2016-01-17 NOTE — Consult Note (Signed)
CARDIOLOGY CONSULT NOTE   Patient ID: [name] MRN: [id number] DOB/AGE: 03/21/1933 82 y.o.  Admit date: 01/16/2016  Primary Physician   [name], MD Primary Cardiologist: Dr. Gwenlyn Found Reason for Consultation  CHF/Decreased EF  HPI:  [name] is a 82 y.o. Female with PMH of CAD/CVA/DM insulin dependent/CHF with current EF 20-25%. Had a LHC on 05/24/10 which showed LAD-40% to 50% stenosed, and OM-2 60% stenosed. Echo on 10/30/2015 showed LV EF: 30% - 35% with grade 2 diastolic dysfunction, mild AVR, but severe MVR, with PA peak pressure of 62mmHg. Repeat Echo on 12/21/15 showed a decrease in EF to 20%-25%. Last seen in the by Cardiology in the office on 01/11/16 for a clinic follow up.During this visit she reported feeling ok, with decreased swelling to the legs, and decreased work of breathing, also reported being close to her dry weight. Did report at this visit 2 days of blood in her stool and CBC was obtained with a Hgb 10.7.   Pt was seen in the GI office by Dr. Carlean Purl 01/16/16 for reports of maroon colored stool, that persisted for about a week. Previous noted Hgb on 01/12/16 was 11.5 but then repeated on 01/16/16 with a decrease to 9.4. Pt sent to the [facility] ED for admission and upper endoscopy scheduled for 01/17/16.   Reports she has been feeling slightly SOB for the past couple of weeks, and had intermittent left sided chest pain for about a week 2/10 that occurs during periods of rest. Family reports patient is at her baseline functional status, which is very dependent on them for ADLs. Son also states she's had a mildly productive cough for about a week, but no fever or chills, or lower extremity edema.   Cardiology has been consulted related to hx of CHF and decreased EF on last echo.   Past Medical History  Diagnosis Date  . Asthma   . CAD (coronary artery disease)     Pt reports MI in 2006 (no documentation).  Cardiolite in 05/2002 and 07/2006 did not reveal any  reversible ischemia.  Pt follows with Dr. Rex Kras at [facility]  . CHF (congestive heart failure) (HCC)     EF 25-30% with dilated LV, mild LVH, severe hypokinesis, and mod-severe reduction in RV function  . Osteoporosis   . HYPERTENSION 08/03/2006  . GASTROPARESIS, DIABETIC 08/03/2006  . HYPERLIPIDEMIA 08/03/2006  . OBSTRUCTIVE SLEEP APNEA 01/06/2008  . PERIPHERAL NEUROPATHY 08/03/2006  . GERD 08/03/2006  . LOW BACK PAIN, CHRONIC 08/03/2006  . OSTEOPOROSIS 03/21/2009  . CEREBRAL EMBOLISM, WITH INFARCTION 07/02/2010  . Angina   . Myocardial infarction ([place]) "2 or 3"  . Pneumonia 02/26/12    "a few times; probably even today"  . DIABETES MELLITUS, TYPE II 11/04/1983  . Blood transfusion     multiple in 2012, 2015.   . GI bleed 08/2011 and 01/2014  . Chronic daily headache     migraines as well.   . Stroke [facility]) summer 2011    "made my left hip worse"  . Uterine cancer ([place])   . Nonischemic cardiomyopathy ([place]) 05/2010    Left heart catheterization:2011. Nonobstructive coronary artery disease.  . Pulmonary hypertension ([place])   . NSTEMI (non-ST elevated myocardial infarction) ([place]) 01/2014    type II with CVA  . Acute embolic stroke ([place]) 0000000  . Non-ST elevation MI (NSTEMI) ([place]) 02/04/2014  . E. coli UTI 03/02/2014  . Renal failure, acute ([place]) 02/04/2014  . Iron  deficiency anemia secondary to blood loss (chronic) 01/06/2008    Qualifier: Diagnosis of  By: Tomasa Hosteller MD, Veronique D.   . Nonsustained ventricular tachycardia ([place])   . Angiodysplasia of intestine with hemorrhage 05/13/2014  . CKD (chronic kidney disease) stage 4, GFR 15-29 ml/min (HCC) 08/21/2014     Past Surgical History  Procedure Laterality Date  . Esophagogastroduodenoscopy  08/26/2011    Procedure: ESOPHAGOGASTRODUODENOSCOPY (EGD);  Surgeon: [name], MD;  Location: [facility] ENDOSCOPY;  Service: Endoscopy;  Laterality: N/A;  . Colonoscopy  08/28/2011    Procedure: COLONOSCOPY;  Surgeon: [name], MD;  Location: [place];  Service: Endoscopy;  Laterality: N/A;  . Vaginal hysterectomy    . Tubal ligation    . Cataract extraction w/ intraocular lens  implant, bilateral    . Toe surgery      "right big toe; operated on it to straighten it out; it was under"  . Polysomnogram  10/17/2005    AHI-7.28/hr. AHI REM-20.8/hr. Average oxygen saturation range during REM and NREM was 97%. Lowest oxygen saturation during REM sleep was 90%.  . Carotid duplex  05/28/2010    No significant extracranial carotid artery stenosis demonstrated. Vertebrals are patent w/ antegrade flow.  . Cardiac catheterization  05/24/2010    No intervention - recommed medical therapy.  . Cardiovascular stress test  08/07/2006    Moderate-severe defect seen in Basal inferior, Mid inferoseptal, Mid inferior, Mid inferolateral, and Apical inferior regions - consistent w/ infarct/scar. No scintigraphic evidence of inducible myocardial ischemia.  . Transthoracic echocardiogram  08/29/2011    EF 55-60%, moderate LVH,   . Esophagogastroduodenoscopy N/A 02/08/2014    Procedure: ESOPHAGOGASTRODUODENOSCOPY (EGD);  Surgeon: [name], MD;  Location: [facility] ENDOSCOPY;  Service: Endoscopy;  Laterality: N/A;  . Colonoscopy N/A 05/12/2014    Procedure: COLONOSCOPY;  Surgeon: [name], MD;  Location: [place];  Service: Endoscopy;  Laterality: N/A;  . Enteroscopy N/A 05/13/2014    Procedure: ENTEROSCOPY;  Surgeon: [name], MD;  Location: [place];  Service: Endoscopy;  Laterality: N/A;  . Enteroscopy N/A 07/13/2014    Procedure: small bowel enteroscopy;  Surgeon: Jerene Bears, MD;  Location: [facility] ENDOSCOPY;  Service: Endoscopy;  Laterality: N/A;  requests slim colonoscope    Allergies  Allergen Reactions  . Baking Soda-Fluoride [Sodium Fluoride] Nausea And Vomiting  . Magnesium Hydroxide Nausea And Vomiting    I have reviewed the patient's current medications . sodium chloride   Intravenous Once  . amLODipine  5 mg Oral Daily  .  aspirin EC  81 mg Oral Daily  . atorvastatin  40 mg Oral q1800  . hydrALAZINE  25 mg Oral 3 times per day  . insulin aspart  0-5 Units Subcutaneous QHS  . insulin aspart  0-9 Units Subcutaneous TID WC  . insulin glargine  7 Units Subcutaneous QHS  . isosorbide mononitrate  60 mg Oral Daily  . magnesium oxide  400 mg Oral BID  . metoprolol succinate  25 mg Oral Daily  . pantoprazole (PROTONIX) IV  40 mg Intravenous Q12H  . torsemide  40 mg Oral Daily     acetaminophen **OR** acetaminophen, albuterol, metolazone, ondansetron **OR** ondansetron (ZOFRAN) IV, oxyCODONE, polyethylene glycol  Prior to Admission medications   Medication Sig Start Date End Date Taking? Authorizing Provider  acetaminophen (TYLENOL) 500 MG tablet Take 500 mg by mouth every 6 (six) hours as needed for moderate pain or headache.   Yes Historical Provider, MD  albuterol (PROAIR HFA) 108 (90 BASE) MCG/ACT inhaler Inhale 2 puffs into the lungs every 6 (six) hours as needed for wheezing. 05/25/14  Yes Daniel J Angiulli, PA-C  amLODipine (NORVASC) 5 MG tablet Take 1 tablet by mouth daily. 12/24/15  Yes Historical Provider, MD  aspirin 81 MG tablet Take 81 mg by mouth daily.   Yes Historical Provider, MD  atorvastatin (LIPITOR) 40 MG tablet TAKE 1 TABLET (40 MG TOTAL) BY MOUTH DAILY AT 6 PM. 11/12/15  Yes Lorretta Harp, MD  hydrALAZINE (APRESOLINE) 25 MG tablet Take 1 tablet (25 mg total) by mouth every 8 (eight) hours. 12/24/15  Yes Shanker Kristeen Mans, MD  insulin aspart (NOVOLOG FLEXPEN) 100 UNIT/ML FlexPen Inject 2-5 Units into the skin 3 (three) times daily with meals.   Yes Historical Provider, MD  insulin glargine (LANTUS) 100 UNIT/ML injection Inject 0.07 mLs (7 Units total) into the skin at bedtime. Patient taking differently: Inject 14 Units into the skin 2 (two) times daily.  11/07/15  Yes Geradine Girt, DO  isosorbide mononitrate (IMDUR) 60 MG 24 hr tablet Take 1 tablet (60 mg total) by mouth daily. 05/25/14  Yes  Daniel J Angiulli, PA-C  loratadine (ALLERGY RELIEF) 10 MG tablet Take 10 mg by mouth daily. Reported on 12/07/2015   Yes Historical Provider, MD  magnesium oxide (MAG-OX) 400 (241.3 MG) MG tablet Take 1 tablet (400 mg total) by mouth 2 (two) times daily. 05/25/14  Yes Daniel J Angiulli, PA-C  metolazone (ZAROXOLYN) 2.5 MG tablet TAKE 1 TABLET BY MOUTH DAILY AND 1 TABLET AS NEEDED WHEN WEIGHT IS OVER 152 LBS 01/11/16  Yes Eileen Stanford, PA-C  metoprolol succinate (TOPROL-XL) 25 MG 24 hr tablet Take 1 tablet by mouth daily. 12/24/15  Yes Historical Provider, MD  polyethylene glycol (MIRALAX / GLYCOLAX) packet Take 17 g by mouth daily as needed for mild constipation.    Yes Historical Provider, MD  torsemide (DEMADEX) 20 MG tablet Take 2 tablets (40 mg total) by mouth daily. 12/24/15  Yes Shanker Kristeen Mans, MD  omeprazole (PRILOSEC) 20 MG capsule Take 1 capsule (20 mg total) by mouth 2 (two) times daily before a meal. 01/12/16   Allie Bossier, MD  potassium chloride SA (KLOR-CON M20) 20 MEQ tablet TAKE 1 TABLET DAILY AND TAKE AN EXTRA TABLET WHEN YOU HAVE TO TAKE AN EXTRA METOLAZONE FOR WT OVER 152 LBS 01/11/16   Eileen Stanford, PA-C     Social History   Social History  . Marital Status: Widowed    Spouse Name: N/A  . Number of Children: 7  . Years of Education: N/A   Occupational History  . Disabled    Social History Main Topics  . Smoking status: Never Smoker   . Smokeless tobacco: Former Systems developer    Types: Snuff    Quit date: 10/13/2006     Comment: 02/26/12 "stopped snuff 4-6 years ago"  . Alcohol Use: No  . Drug Use: No  . Sexual Activity: No   Other Topics Concern  . Not on file   Social History Narrative   ** Merged History Encounter **       Lives with daughter    Family Status  Relation Status Death Age  . Mother Deceased   . Father Deceased 56  . Sister Alive   . Child Alive   . Brother Deceased 94   Family History  Problem Relation Age of Onset  . Diabetes  insipidus Mother   .  Hypertension Mother   . Hypertension Father   . Hypertension Sister   . Hypertension Child   . Stomach cancer Brother      ROS:  Full 14 point review of systems complete and found to be negative unless listed above.  Physical Exam: Blood pressure 139/57, pulse 68, temperature 98.2 F (36.8 C), temperature source Oral, resp. rate 22, height 5\' 2"  (1.575 m), weight 145 lb 4.5 oz (65.9 kg), last menstrual period 12/19/1968, SpO2 96 %.  General: Well developed, well nourished, female in no acute distress Head: Eyes PERRLA, No xanthomas.   Normocephalic and atraumatic, oropharynx without edema or exudate. Dentition:  Lungs:Rhonchi throughout lung fields that improved from the supine to sitting position. Heart: HRRR S1 S2, no rub/gallop, Heart irregular rate and rhythm with S1, S2  3/6 murmur. pulses are 2+ extrem.   Neck: No carotid bruits. No lymphadenopathy. No JVD. Abdomen: Bowel sounds present, abdomen soft and non-tender without masses or hernias noted. Msk:  No spine or cva tenderness. No weakness, no joint deformities or effusions. Extremities: No clubbing or cyanosis.  edema.  Neuro: Alert and oriented X 3. No focal deficits noted. Psych:  Good affect, responds appropriately Skin: No rashes or lesions noted.  Labs:   Lab Results  Component Value Date   WBC 4.4 01/17/2016   HGB 9.4* 01/17/2016   HCT 30.0* 01/17/2016   MCV 84.3 01/17/2016   PLT 282 01/17/2016    Recent Labs  01/16/16 2122  INR 1.24    Recent Labs Lab 01/17/16 0154  NA 141  K 3.8  CL 106  CO2 26  BUN 35*  CREATININE 1.71*  CALCIUM 8.9  PROT 6.1*  BILITOT 0.7  ALKPHOS 180*  ALT 18  AST 19  GLUCOSE 122*  ALBUMIN 2.9*   MAGNESIUM  Date Value Ref Range Status  01/16/2016 1.7 1.7 - 2.4 mg/dL Final   No results for input(s): CKTOTAL, CKMB, TROPONINI in the last 72 hours. No results for input(s): TROPIPOC in the last 72 hours. B NATRIURETIC PEPTIDE  Date/Time Value Ref  Range Status  12/20/2015 05:36 PM 1957.6* 0.0 - 100.0 pg/mL Final  11/04/2015 01:50 PM 1841.7* 0.0 - 100.0 pg/mL Final   Lab Results  Component Value Date   CHOL 114 11/05/2015   HDL 60 11/05/2015   LDLCALC 43 11/05/2015   TRIG 57 11/05/2015   No results found for: DDIMER LIPASE  Date/Time Value Ref Range Status  12/21/2015 12:11 AM 22 11 - 51 U/L Final   TSH  Date/Time Value Ref Range Status  02/27/2012 09:30 AM 1.773 0.350 - 4.500 uIU/mL Final   VITAMIN B-12  Date/Time Value Ref Range Status  01/12/2016 09:50 AM 1743* 180 - 914 pg/mL Final    Comment:    (NOTE) This assay is not validated for testing neonatal or myeloproliferative syndrome specimens for Vitamin B12 levels.    FOLATE  Date/Time Value Ref Range Status  01/12/2016 09:53 AM 11.3 >5.9 ng/mL Final   FERRITIN  Date/Time Value Ref Range Status  01/12/2016 09:50 AM 226 11 - 307 ng/mL Final  07/26/2015 07:58 AM 288* 9 - 269 ng/ml Final   TIBC  Date/Time Value Ref Range Status  01/12/2016 09:50 AM 304 250 - 450 ug/dL Final   IRON  Date/Time Value Ref Range Status  01/12/2016 09:50 AM 23* 28 - 170 ug/dL Final   RETIC %  Date/Time Value Ref Range Status  07/26/2015 07:58 AM 3.61* 0.70 - 2.10 % Final  RETIC CT PCT  Date/Time Value Ref Range Status  01/12/2016 09:50 AM 1.0 0.4 - 3.1 % Final     ECG:  None obtained on this admission  Radiology:  X-ray Chest Pa And Lateral  01/16/2016  CLINICAL DATA:  History of rectal bleeding, congestive heart failure, diabetes, chronic renal disease EXAM: CHEST  2 VIEW COMPARISON:  Chest x-ray of 01/12/2016 FINDINGS: No pneumonia is seen. There is moderate pulmonary vascular congestion however with moderate cardiomegaly. Tiny pleural effusions cannot be excluded. The thoracic vertebrae are normal alignment. IMPRESSION: Cardiomegaly mild pulmonary vascular congestion. Electronically Signed   By: Ivar Drape M.D.   On: 01/16/2016 17:20    ASSESSMENT AND PLAN: Principal  Problem:   GI bleed Active Problems: 1. Chronic Combined systolic and diastolic congestive heart failure (HCC)  - Current medications BB, IMDUR, Demadex  - Lasix 40mg  x1 dose given this morning, monitor I&O  - Pt appears with minimal fluid overload at this time   2. DM (diabetes mellitus), type 2, uncontrolled ([place])  -Currently in Long and Short Acting Insulin  -Cbgs controlled   3. Essential hypertension  -Controlled on current medications    4. CAD (coronary artery disease)   5. Anemia  -Hgb dropped from 11.4-9.5 over the past 5 days, but has maintained at 9.5 throughout last couple of lab draws  -GI currently following pt, and possible upper endoscopy planned during current  admission.   7. CKD (chronic kidney disease)  -Cr is improved at this visit, as noted in the HPI  Signed: Reino Bellis NP-C 01/17/2016 12:15 PM  Co-Sign MD  The patient was seen, examined and discussed with Reino Bellis, NP-C and I agree with the above.   A pleasant 82 year old female with prior medical history of chronic combined systolic and diastolic congestive heart failure with LVEF 25-30%, severe MR and severe pulmonary hypertension. Her LVEF rates over years has been up and down it pretty much in severely impaired range. The patient presented melena and hematochezia and EGD is planned. She also states that she had been feeling sick for about a week with cough and congestion in her sinuses and mild shortness of breath.. Her cough is productive of whitish sputum. On physical exam she's coughing there is just white sputum, she sounds very congested in her upper airways and her bases of her lungs showed just mild crackles. She has a loud systolic murmur 4 out of 6 heard at the left lower sternal border. Her lower extremities have no edema. The patient states that this is not much worse from her baseline. This patient has a mild acute on chronic combined systolic and diastolic CHF I agree with home dose  of torsemide 40 mg daily I would add 20 mg of Lasix IV 2 doses and reevaluate her in the morning. I would recommend to start the decongestant and mucolytic agent  Like dextromethorphan. Crea 1.7, baseline 1.8 - 2.2.  Dorothy Spark 01/17/2016

## 2016-01-17 NOTE — Progress Notes (Signed)
Inpatient Diabetes Program Recommendations  AACE/ADA: New Consensus Statement on Inpatient Glycemic Control (2015)  Target Ranges:  Prepandial:   less than 140 mg/dL      Peak postprandial:   less than 180 mg/dL (1-2 hours)      Critically ill patients:  140 - 180 mg/dL   Results for [name], [name] (MRN [id number]) as of 01/17/2016 09:20  Ref. Range 01/16/2016 18:45 01/17/2016 00:00 01/17/2016 08:20 01/17/2016 09:10  Glucose-Capillary Latest Ref Range: 65-99 mg/dL 289 (H) 166 (H) 45 (L) 104 (H)   Review of Glycemic Control  Diabetes history: DM 2 Outpatient Diabetes medications: Lantus 14 units BID, Novolog 2-5 units TID Current orders for Inpatient glycemic control: Lantus 7 units Daily, Novolog Sensitive TID + HS scale  Inpatient Diabetes Program Recommendations: Insulin - Basal: Patient had hypoglycemia on half of home dose of basal insulin. Please d/c basal insulin today, may restart back when eating again. Renal function is also slightly elevated.  Thanks,  Tama Headings RN, MSN, [facility] Inpatient Diabetes Coordinator Team Pager [phone] (8a-5p)

## 2016-01-17 NOTE — Progress Notes (Signed)
[facility] Progress Note  Chief Complaint: anemia and melena  Subjective History:  Patient had a difficult night with dyspnea, and has low glucose this AM (has been NPO but not on D5 fluids)- rec'd D50 just now She has had no reported melena or hematochezia ovn,  Hgb stable since admission. Denies abd pain  ROS: Cardiovascular:  no chest pain  Objective:  Med list reviewed  Vital signs in last 24 hrs: Filed Vitals:   01/16/16 2354 01/17/16 0439  BP:  129/53  Pulse: 64 65  Temp:  98.2 F (36.8 C)  Resp: 20 22    Physical Exam  She is coughing and dyspneic at rest.  Not in distress  HEENT: sclera anicteric, oral mucosa moist without lesions  Neck: supple, no thyromegaly, JVD or lymphadenopathy  Cardiac: RRR without murmurs, S1S2 heard, no peripheral edema  Pulm: RR 16, diffuse rhonchi, similar to admission last evening.  Mild bibasilar crackles  Abdomen: soft, no tenderness, with active bowel sounds. No guarding or palpable hepatosplenomegaly  Skin; warm and dry, no jaundice or rash  Recent Labs:   Recent Labs Lab 01/15/16 1542 01/16/16 1245 01/16/16 2122 01/17/16 0144 01/17/16 0154  WBC 3.8* 4.9  --   --  4.4  HGB 9.5* 9.5* 9.2* 9.1* 9.3*  HCT 29.1* 30.5* 29.3* 28.9* 29.0*  PLT 223.0 240  --   --  282    Recent Labs Lab 01/17/16 0154  NA 141  K 3.8  CL 106  CO2 26  BUN 35*  ALBUMIN 2.9*  ALKPHOS 180*  ALT 18  AST 19  GLUCOSE 122*    Recent Labs Lab 01/16/16 2122  INR 1.24    Radiologic studies: CXR (personally reviewed) - pulmonary vasculature congestion, cardiomegaly.  No focal infiltrate  @ASSESSMENTPLANBEGIN @ Assessment:  Melena Anemia of acute GI blood loss SB AVMs - suspected source of bleeding Dyspnea - I think she has bronchitis, perhaps a mild element of pulmonary edema  Plan:  She is not ready for an endoscopic procedure today due to risks of resp compromise from sedation. Fortunately, Hgb remains stable, so no  brisk GI bleeding.  I will order a diet, then NPO after MN in case ready for enteroscopy tomorrow. Change protonix gtt to BID IV dosing starting this evening Serial Hgb  Medicine team to attend to respiratory issue- currently on 2 liters N/C oxygen with normal O2 sat  Nelida Meuse III Pager [phone] Mon-Fri 8a-5p [phone] after 5p, weekends, holidays

## 2016-01-18 ENCOUNTER — Ambulatory Visit: Payer: Self-pay | Admitting: Cardiology

## 2016-01-18 ENCOUNTER — Encounter (HOSPITAL_COMMUNITY)
Admission: EM | Disposition: A | Discharge status: Home Health | Payer: Self-pay | Source: Home / Self Care | Attending: Internal Medicine

## 2016-01-18 DIAGNOSIS — K921 Melena: Secondary | ICD-10-CM

## 2016-01-18 DIAGNOSIS — J209 Acute bronchitis, unspecified: Secondary | ICD-10-CM

## 2016-01-18 DIAGNOSIS — I10 Essential (primary) hypertension: Secondary | ICD-10-CM

## 2016-01-18 DIAGNOSIS — D508 Other iron deficiency anemias: Secondary | ICD-10-CM

## 2016-01-18 DIAGNOSIS — D62 Acute posthemorrhagic anemia: Secondary | ICD-10-CM | POA: Insufficient documentation

## 2016-01-18 LAB — CBC
HEMATOCRIT: 27.1 % — AB (ref 36.0–46.0)
HEMOGLOBIN: 8.9 g/dL — AB (ref 12.0–15.0)
MCH: 27.6 pg (ref 26.0–34.0)
MCHC: 32.8 g/dL (ref 30.0–36.0)
MCV: 84.2 fL (ref 78.0–100.0)
Platelets: 242 10*3/uL (ref 150–400)
RBC: 3.22 MIL/uL — AB (ref 3.87–5.11)
RDW: 16.8 % — AB (ref 11.5–15.5)
WBC: 5.2 10*3/uL (ref 4.0–10.5)

## 2016-01-18 LAB — GLUCOSE, CAPILLARY
GLUCOSE-CAPILLARY: 85 mg/dL (ref 65–99)
GLUCOSE-CAPILLARY: 129 mg/dL — AB (ref 65–99)
GLUCOSE-CAPILLARY: 317 mg/dL — AB (ref 65–99)
GLUCOSE-CAPILLARY: 224 mg/dL — AB (ref 65–99)
Glucose-Capillary: 49 mg/dL — ABNORMAL LOW (ref 65–99)

## 2016-01-18 LAB — BASIC METABOLIC PANEL
Anion gap: 12 (ref 5–15)
BUN: 39 mg/dL — AB (ref 6–20)
CO2: 23 mmol/L (ref 22–32)
Calcium: 9 mg/dL (ref 8.9–10.3)
Chloride: 106 mmol/L (ref 101–111)
Creatinine, Ser: 1.97 mg/dL — ABNORMAL HIGH (ref 0.44–1.00)
GFR calc Af Amer: 26 mL/min — ABNORMAL LOW (ref >60)
GFR, EST NON AFRICAN AMERICAN: 23 mL/min — AB (ref >60)
GLUCOSE: 49 mg/dL — AB (ref 65–99)
POTASSIUM: 4.5 mmol/L (ref 3.5–5.1)
Sodium: 141 mmol/L (ref 135–145)

## 2016-01-18 LAB — BRAIN NATRIURETIC PEPTIDE: B NATRIURETIC PEPTIDE 5: 1028.7 pg/mL — AB (ref 0.0–100.0)

## 2016-01-18 SURGERY — ENTEROSCOPY
Anesthesia: Monitor Anesthesia Care

## 2016-01-18 MED ORDER — GUAIFENESIN-DM 100-10 MG/5ML PO SYRP
5.0000 mL | ORAL_SOLUTION | Freq: Four times a day (QID) | ORAL | Status: DC
  Administered 2016-01-18 – 2016-01-22 (×16): 5 mL via ORAL
  Filled 2016-01-18 (×16): qty 5

## 2016-01-18 MED ORDER — WHITE PETROLATUM GEL
Status: AC
  Administered 2016-01-18: 14:00:00
  Filled 2016-01-18: qty 1

## 2016-01-18 MED ORDER — MOMETASONE FURO-FORMOTEROL FUM 100-5 MCG/ACT IN AERO
2.0000 | INHALATION_SPRAY | Freq: Two times a day (BID) | RESPIRATORY_TRACT | Status: DC
  Administered 2016-01-18 – 2016-01-22 (×8): 2 via RESPIRATORY_TRACT
  Filled 2016-01-18: qty 8.8

## 2016-01-18 MED ORDER — DM-GUAIFENESIN ER 30-600 MG PO TB12
1.0000 | ORAL_TABLET | Freq: Two times a day (BID) | ORAL | Status: DC
  Administered 2016-01-18 – 2016-01-22 (×8): 1 via ORAL
  Filled 2016-01-18 (×7): qty 1

## 2016-01-18 NOTE — Care Management Important Message (Signed)
Important Message  Patient Details  Name: [name] MRN: [id number] Date of Birth: 26-Jul-1933   Medicare Important Message Given:  Yes    Nathen May 01/18/2016, 10:34 AM

## 2016-01-18 NOTE — Progress Notes (Signed)
CRITICAL VALUE ALERT  Critical value received:  CBG 49  Date of notification:  01/18/16  Time of notification:  09:00  Critical value read back:Yes.    Nurse who received alert:  Rosalio Loud RN  Two orange juice given  CBG rechecked 9:20am   89

## 2016-01-18 NOTE — Progress Notes (Signed)
Daily Rounding Note  01/18/2016, 8:53 AM  LOS: 2 days   SUBJECTIVE:       BM yesterday, not charted as to color.  Pt without complaints, denies SOB  OBJECTIVE:         Vital signs in last 24 hours:    Temp:  [98.2 F (36.8 C)-98.6 F (37 C)] 98.2 F (36.8 C) (04/07 0420) Pulse Rate:  [61-68] 61 (04/07 0420) Resp:  [18-20] 20 (04/07 0420) BP: (118-139)/(49-57) 125/49 mmHg (04/07 0420) SpO2:  [100 %] 100 % (04/07 0420) Weight:  [64.9 kg (143 lb 1.3 oz)] 64.9 kg (143 lb 1.3 oz) (04/07 0700) Last BM Date: 01/17/16 Filed Weights   01/17/16 0439 01/18/16 0700  Weight: 65.9 kg (145 lb 4.5 oz) 64.9 kg (143 lb 1.3 oz)   General: frail, ill appearing   Heart: RRR Chest: wheezes bil.  Some ronchi, no rales.  No dyspnea at rest.  Coughed once as she sat up.  Abdomen: soft, NT, ND.  BS active  Extremities: no CCE, thin.  Neuro/Psych:  Appropriate, calm.  Moves all 4s.  Oriented x 3.   Intake/Output from previous day: 04/06 0701 - 04/07 0700 In: 440 [P.O.:390; IV Piggyback:50] Out: 150 [Urine:150]  Intake/Output this shift:    Lab Results:  Recent Labs  01/16/16 1245  01/17/16 0154 01/17/16 0924 01/17/16 1714 01/18/16 0755  WBC 4.9  --  4.4  --   --  PENDING  HGB 9.5*  < > 9.3* 9.4* 8.5* 8.9*  HCT 30.5*  < > 29.0* 30.0* 27.3* 27.1*  PLT 240  --  282  --   --  242  < > = values in this interval not displayed. BMET  Recent Labs  01/16/16 1245 01/17/16 0154 01/18/16 0544  NA 140 141 141  K 4.3 3.8 4.5  CL 106 106 106  CO2 26 26 23   GLUCOSE 225* 122* 49*  BUN 39* 35* 39*  CREATININE 1.85* 1.71* 1.97*  CALCIUM 9.1 8.9 9.0   LFT  Recent Labs  01/16/16 1245 01/17/16 0154  PROT 6.7 6.1*  ALBUMIN 3.2* 2.9*  AST 25 19  ALT 22 18  ALKPHOS 206* 180*  BILITOT 0.7 0.7   PT/INR  Recent Labs  01/16/16 2122  LABPROT 15.8*  INR 1.24   Hepatitis Panel No results for  input(s): HEPBSAG, HCVAB, HEPAIGM, HEPBIGM in the last 72 hours.  Studies/Results: X-ray Chest Pa And Lateral  01/16/2016  CLINICAL DATA:  History of rectal bleeding, congestive heart failure, diabetes, chronic renal disease EXAM: CHEST  2 VIEW COMPARISON:  Chest x-ray of 01/12/2016 FINDINGS: No pneumonia is seen. There is moderate pulmonary vascular congestion however with moderate cardiomegaly. Tiny pleural effusions cannot be excluded. The thoracic vertebrae are normal alignment. IMPRESSION: Cardiomegaly mild pulmonary vascular congestion. Electronically Signed   By: Ivar Drape M.D.   On: 01/16/2016 17:20    ASSESMENT:   Melena Anemia of acute GI blood loss SB AVMs - suspected source of bleeding Dyspnea - suspect bronchitis, perhaps a mild element of pulmonary edema  Hypoglycemia, has been NPO.    PLAN   *  Not  ready for EGD, hoping resp status can improve to allow for safer sedation.   *  Resumed her diet.     Azucena Freed  01/18/2016, 8:53 AM Pager: [phone]

## 2016-01-18 NOTE — Progress Notes (Signed)
Patient Name: [name] Date of Encounter: 01/18/2016  Principal Problem:   GI bleed Active Problems:   DM (diabetes mellitus), type 2, uncontrolled ([name])   Iron deficiency anemia secondary to blood loss (chronic)   Essential hypertension   Chronic diastolic congestive heart failure (HCC)   GERD   CAD (coronary artery disease)   Irregular heart rhythm   NSTEMI (non-ST elevated myocardial infarction) (HCC)   Anemia   Acute on chronic combined systolic and diastolic heart failure (HCC)   CKD (chronic kidney disease)   Length of Stay: 2  SUBJECTIVE  The patient continues to cough and feel SOB.  CURRENT MEDS . sodium chloride   Intravenous Once  . amLODipine  5 mg Oral Daily  . aspirin EC  81 mg Oral Daily  . atorvastatin  40 mg Oral q1800  . cefTRIAXone (ROCEPHIN)  IV  1 g Intravenous Q24H  . guaiFENesin-dextromethorphan  5 mL Oral Q6H  . hydrALAZINE  25 mg Oral 3 times per day  . insulin aspart  0-5 Units Subcutaneous QHS  . insulin aspart  0-9 Units Subcutaneous TID WC  . isosorbide mononitrate  60 mg Oral Daily  . magnesium oxide  400 mg Oral BID  . metoprolol succinate  25 mg Oral Daily  . mometasone-formoterol  2 puff Inhalation BID  . pantoprazole (PROTONIX) IV  40 mg Intravenous Q12H  . torsemide  40 mg Oral Daily   OBJECTIVE  Filed Vitals:   01/17/16 1435 01/17/16 2101 01/18/16 0420 01/18/16 0700  BP:  118/55 125/49   Pulse: 63 67 61   Temp: 98.6 F (37 C) 98.6 F (37 C) 98.2 F (36.8 C)   TempSrc: Oral Oral    Resp: 18 18 20    Height:      Weight:    143 lb 1.3 oz (64.9 kg)  SpO2: 100% 100% 100%     Intake/Output Summary (Last 24 hours) at 01/18/16 0931 Last data filed at 01/18/16 0509  Gross per 24 hour  Intake    320 ml  Output    150 ml  Net    170 ml   Filed Weights   01/17/16 0439 01/18/16 0700  Weight: 145 lb 4.5 oz (65.9 kg) 143 lb 1.3 oz (64.9 kg)   PHYSICAL EXAM  General: Pleasant, NAD. Neuro: Alert and oriented X 3. Moves  all extremities spontaneously. Psych: Normal affect. HEENT:  Normal  Neck: Supple without bruits or JVD. Lungs:  Resp regular and unlabored, Crackles at both bases. Heart: RRR no s3, s4, or murmurs. Abdomen: Soft, non-tender, non-distended, BS + x 4.  Extremities: No clubbing, cyanosis or edema. DP/PT/Radials 2+ and equal bilaterally.  Accessory Clinical Findings  CBC  Recent Labs  01/15/16 1542  01/17/16 0154  01/17/16 1714 01/18/16 0755  WBC 3.8*  < > 4.4  --   --  PENDING  NEUTROABS 2.3  --   --   --   --   --   HGB 9.5*  < > 9.3*  < > 8.5* 8.9*  HCT 29.1*  < > 29.0*  < > 27.3* 27.1*  MCV 84.5  < > 84.3  --   --  84.2  PLT 223.0  < > 282  --   --  242  < > = values in this interval not displayed. Basic Metabolic Panel  Recent Labs  01/16/16 2122 01/17/16 0154 01/18/16 0544  NA  --  141 141  K  --  3.8 4.5  CL  --  106 106  CO2  --  26 23  GLUCOSE  --  122* 49*  BUN  --  35* 39*  CREATININE  --  1.71* 1.97*  CALCIUM  --  8.9 9.0  MG 1.7  --   --   PHOS 2.2*  --   --    Liver Function Tests  Recent Labs  01/16/16 1245 01/17/16 0154  AST 25 19  ALT 22 18  ALKPHOS 206* 180*  BILITOT 0.7 0.7  PROT 6.7 6.1*  ALBUMIN 3.2* 2.9*   X-ray Chest Pa And Lateral  01/16/2016  CLINICAL DATA:  History of rectal bleeding, congestive heart failure, diabetes, chronic renal disease EXAM: CHEST  2 VIEW COMPARISON:  Chest x-ray of 01/12/2016 FINDINGS: No pneumonia is seen. There is moderate pulmonary vascular congestion however with moderate cardiomegaly. Tiny pleural effusions cannot be excluded. The thoracic vertebrae are normal alignment. IMPRESSION: Cardiomegaly mild pulmonary vascular congestion. Electronically Signed   By: Ivar Drape M.D.   On: 01/16/2016 17:20   Dg Chest 2 View  01/12/2016  CLINICAL DATA:  Pulmonary edema EXAM: CHEST  2 VIEW COMPARISON:  12/20/2015 FINDINGS: The heart is moderately enlarged. Mild diffuse pulmonary edema. Tiny pleural effusions. No  pneumothorax. IMPRESSION: Mild CHF.  Tiny pleural effusions. Electronically Signed   By: Marybelle Killings M.D.   On: 01/12/2016 11:45   Dg Chest 2 View  12/20/2015  CLINICAL DATA:  For 3 years patient has been severely SOB. Patient states she can not remember a lot. HX CHF, CAD, HTN, Diabetes, myocardial infarction, pneumonia, stroke 2011, cardiac catheterization EXAM: CHEST  2 VIEW COMPARISON:  11/04/2015 FINDINGS: Heart size is markedly enlarged but stable in appearance. Interstitial edema again noted. Small right pleural effusion. There are no focal consolidations. Visualized osseous structures have a normal appearance. IMPRESSION: Stable marked enlargement of the heart/pericardial silhouette. CHF/interstitial edema and small right effusion. Electronically Signed   By: Nolon Nations M.D.   On: 12/20/2015 17:55   Tele:     ASSESSMENT AND PLAN  1. Chronic Combined systolic and diastolic congestive heart failure (HCC) - Current medications BB, IMDUR, Demadex - Lasix 40mg  x1 dose given this morning, monitor I&O - Pt appears with minimal fluid overload at this time  2. DM (diabetes mellitus), type 2, uncontrolled ([name]) -Currently in Long and Short Acting Insulin -Cbgs controlled  3. Essential hypertension -Controlled on current medications  4. CAD (coronary artery disease)  5. Anemia -Hgb dropped from 11.4-9.5 over the past 5 days, but has maintained at 9.5 throughout last couple of lab draws -GI currently following pt, and possible upper endoscopy planned during current admission.  7. CKD (chronic kidney disease) -Cr is improved at this visit, as noted in the HPI  A pleasant 82 year old female with prior medical history of chronic combined systolic and diastolic congestive heart failure with LVEF 25-30%, severe MR and severe pulmonary hypertension. Her LVEF rates  over years has been up and down it pretty much in severely impaired range. The patient presented melena and hematochezia and EGD is planned, however GI waiting until the patient is more stable, she continues to cough and feels SOB.   She also states that she had been feeling sick for about a week with cough and congestion in her sinuses and mild shortness of breath.. Her cough is productive of whitish sputum. On physical exam she's coughing there is just white sputum, she sounds very congested in her upper airways  and her bases of her lungs showed just mild crackles. She has a loud systolic murmur 4 out of 6 heard at the left lower sternal border. Her lower extremities have no edema. The patient states that this is not much worse from her baseline.  I will continue iv lasix, increase to 40 mg BID, monitor Crea closely. Crea 1.7--1.9, baseline 1.8 - 2.2.  I agree with nebulizer treatments, start decongestant and mucolytic agent Like dextromethorphan. Started on iv ceftriaxone yesterday.  Signed, Dorothy Spark MD, [facility] 01/18/2016

## 2016-01-18 NOTE — Progress Notes (Addendum)
Inpatient Diabetes Program Recommendations  AACE/ADA: New Consensus Statement on Inpatient Glycemic Control (2015)  Target Ranges:  Prepandial:   less than 140 mg/dL      Peak postprandial:   less than 180 mg/dL (1-2 hours)      Critically ill patients:  140 - 180 mg/dL   Results for [name], [name] (MRN [id number]) as of 01/18/2016 08:51  Ref. Range 01/17/2016 08:20 01/17/2016 09:10 01/17/2016 11:56 01/17/2016 16:36 01/17/2016 21:00 01/18/2016 08:40  Glucose-Capillary Latest Ref Range: 65-99 mg/dL 45 (L) 104 (H) 119 (H) 183 (H) 220 (H) 49 (L)   Review of Glycemic Control  Diabetes history: DM 2 Outpatient Diabetes medications: Lantus 14 units BID, Novolog 2-5 units TID Current orders for Inpatient glycemic control: Lantus 7 units Daily, Novolog Sensitive TID + HS scale  Inpatient Diabetes Program Recommendations: Insulin - Basal: Patient had hypoglycemia the past 2 mornings on half of home dose of basal insulin. Please d/c basal insulin today. Renal function is also slightly elevated and has risen since yesterday.  Thanks,  Tama Headings RN, MSN, [facility] Inpatient Diabetes Coordinator Team Pager [phone] (8a-5p)

## 2016-01-18 NOTE — Evaluation (Signed)
Physical Therapy Evaluation Patient Details Name: [name] MRN: [id number] DOB: 1933/07/15 Today's Date: 01/18/2016   History of Present Illness  Pt adm with GI bleed. PMH - CAD, CHF, CVA, GI bleed  Clinical Impression  Pt admitted with above diagnosis and presents to PT with functional limitations due to deficits listed below (See PT problem list). Pt needs skilled PT to maximize independence and safety to allow discharge to home with family.      Follow Up Recommendations Home health PT;Supervision for mobility/OOB    Equipment Recommendations  None recommended by PT    Recommendations for Other Services       Precautions / Restrictions Precautions Precautions: Fall Restrictions Weight Bearing Restrictions: No      Mobility  Bed Mobility Overal bed mobility: Needs Assistance Bed Mobility: Supine to Sit     Supine to sit: Supervision;HOB elevated     General bed mobility comments: Incr time  Transfers Overall transfer level: Needs assistance Equipment used: Rolling walker (2 wheeled) Transfers: Sit to/from Stand Sit to Stand: Min assist         General transfer comment: Assist to bring hips up and for balance  Ambulation/Gait Ambulation/Gait assistance: Min assist Ambulation Distance (Feet): 15 Feet Assistive device: Rolling walker (2 wheeled) Gait Pattern/deviations: Step-through pattern;Decreased step length - right;Decreased step length - left Gait velocity: decr Gait velocity interpretation: <1.8 ft/sec, indicative of risk for recurrent falls General Gait Details: Knees "bounce" during ambulation.Assist for balance and support.  Stairs            Wheelchair Mobility    Modified Rankin (Stroke Patients Only)       Balance Overall balance assessment: Needs assistance Sitting-balance support: No upper extremity supported;Feet supported Sitting balance-Leahy Scale: Fair     Standing balance support: Bilateral upper extremity  supported Standing balance-Leahy Scale: Poor Standing balance comment: walker and min guard for static standing                             Pertinent Vitals/Pain Pain Assessment: No/denies pain    Home Living Family/patient expects to be discharged to:: Private residence Living Arrangements: Children Available Help at Discharge: Family;Personal care attendant;Available 24 hours/day Type of Home: Apartment Home Access: Stairs to enter Entrance Stairs-Rails: None Entrance Stairs-Number of Steps: 1 Home Layout: One level Home Equipment: Walker - 2 wheels;Shower seat;Wheelchair - manual      Prior Function Level of Independence: Needs assistance   Gait / Transfers Assistance Needed: supervision for ambulation with RW  ADL's / Homemaking Assistance Needed: needs assistance with bathing/dressing/toileting        Hand Dominance   Dominant Hand: Right    Extremity/Trunk Assessment   Upper Extremity Assessment: Generalized weakness           Lower Extremity Assessment: Generalized weakness         Communication   Communication: HOH  Cognition Arousal/Alertness: Awake/alert Behavior During Therapy: WFL for tasks assessed/performed Overall Cognitive Status: Within Functional Limits for tasks assessed                      General Comments      Exercises        Assessment/Plan    PT Assessment Patient needs continued PT services  PT Diagnosis Difficulty walking;Generalized weakness   PT Problem List Decreased strength;Decreased balance;Decreased mobility  PT Treatment Interventions DME instruction;Gait training;Functional mobility training;Therapeutic activities;Therapeutic exercise;Balance training;Patient/family education  PT Goals (Current goals can be found in the Care Plan section) Acute Rehab PT Goals Patient Stated Goal: Not stated PT Goal Formulation: With patient Time For Goal Achievement: 01/25/16 Potential to Achieve Goals:  Good    Frequency Min 3X/week   Barriers to discharge        Co-evaluation               End of Session Equipment Utilized During Treatment: Gait belt Activity Tolerance: Patient tolerated treatment well Patient left: in chair;with call bell/phone within reach;with chair alarm set;with family/visitor present Nurse Communication: Mobility status         Time: 1220-1239 PT Time Calculation (min) (ACUTE ONLY): 19 min   Charges:   PT Evaluation $PT Eval Moderate Complexity: 1 Procedure     PT G Codes:        Edana Aguado 23-Jan-2016, 3:39 PM [facility] PT [phone]

## 2016-01-18 NOTE — Progress Notes (Signed)
Patient ID: [name], female   DOB: 1933/07/28, 82 y.o.   MRN: [id number]  [facility] PROGRESS NOTE  [name] [id number] DOB: 03-28-1933 DOA: 01/16/2016 PCP: [name], MD   Brief narrative:    82 y.o. 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 She was seen at hospital 01/12/16 for evaluation of dark stools for which she was to continue workup as OP. It was noted that hemoglobin was 11.5 at that time however, this admission, hemoglobin was 9.3. GI consulted but deferred scope due to rhonchi on lung exam and [facility] consulted cardiology.   Assessment/Plan:    Acute blood loss anemia, GI bleed, Angiodysplasia of intestine with suspected hemorrhage  - GI consulted, scoping on hold for now until heart issues resolved  - on PPI already - appreciate GI team following   Chronic Combined systolic and diastolic congestive heart failure (HCC) - Current medications BB, IMDUR, Demadex Continue Lasix> now changed to Demadex, Zaroxolyn, monitor creatinine closely, - weight is 65.9 kg this AM Appear to be stable from a cardiopulmonary standpoint Added Robitussin-DM, Dulera   ? UTI - UA and urine culture requested as pt with dysuria and malodorous urine - placed on empiric Rocephin No urine culture from this admission Continue antibiotics for a total of 5 days  DM (diabetes mellitus), type 2, uncontrolled (HCC) Hypoglycemic this morning, discontinue Lantus  Essential hypertension - reasonable inpatient controlled  CAD (coronary artery disease) - asymptomatic   CKD (chronic kidney disease), stage IV - Crea 1.7--1.9, baseline 1.8 - 2.2. - BMP in AM  DVT prophylaxis - SCD's  Code Status: Full.  Family Communication:  plan of care discussed with the patient Disposition Plan: Home when stable.    IV access:  Peripheral IV  Procedures and diagnostic studies:    X-ray Chest Pa And Lateral  01/16/2016  CLINICAL DATA:  History of rectal bleeding, congestive heart failure, diabetes, chronic renal disease EXAM: CHEST  2 VIEW COMPARISON:  Chest x-ray of 01/12/2016 FINDINGS: No pneumonia is seen. There is moderate pulmonary vascular congestion however with moderate cardiomegaly. Tiny pleural effusions cannot be excluded. The thoracic vertebrae are normal alignment. IMPRESSION: Cardiomegaly mild pulmonary vascular congestion. Electronically Signed   By: Ivar Drape M.D.   On: 01/16/2016 17:20   Dg Chest 2 View  01/12/2016  CLINICAL DATA:  Pulmonary edema EXAM: CHEST  2 VIEW COMPARISON:  12/20/2015 FINDINGS: The heart is moderately enlarged. Mild diffuse pulmonary edema. Tiny pleural effusions. No pneumothorax. IMPRESSION: Mild CHF.  Tiny pleural effusions. Electronically Signed   By: Marybelle Killings M.D.   On: 01/12/2016 11:45   Dg Chest 2 View  12/20/2015  CLINICAL DATA:  For 3 years patient has been severely SOB. Patient states she can not remember a lot. HX CHF, CAD, HTN, Diabetes, myocardial infarction, pneumonia, stroke 2011, cardiac catheterization EXAM: CHEST  2 VIEW COMPARISON:  11/04/2015 FINDINGS: Heart size is markedly enlarged but stable in appearance. Interstitial edema again noted. Small right pleural effusion. There are no focal consolidations. Visualized osseous structures have a normal appearance. IMPRESSION: Stable marked enlargement of the heart/pericardial silhouette. CHF/interstitial edema and small right effusion. Electronically Signed   By: Nolon Nations M.D.   On: 12/20/2015 17:55    Medical Consultants:  GI Cardiology   Other Consultants:  None  IAnti-Infectives:   Ceftriaxone 4/6 -->  Reyne Dumas, MD  [facility] Pager [phone]  If 7PM-7AM, please contact night-coverage [email or web address] Password [facility] 01/18/2016, 12:43 PM   LOS: 2 days   HPI/Subjective: Denies any  chest pain or shortness of breath, hypoglycemic this morning  Objective: Filed Vitals:   01/17/16 1435 01/17/16 2101 01/18/16 0420 01/18/16 0700  BP:  118/55 125/49   Pulse: 63 67 61   Temp: 98.6 F (37 C) 98.6 F (37 C) 98.2 F (36.8 C)   TempSrc: Oral Oral    Resp: 18 18 20    Height:      Weight:    64.9 kg (143 lb 1.3 oz)  SpO2: 100% 100% 100%     Intake/Output Summary (Last 24 hours) at 01/18/16 1243 Last data filed at 01/18/16 0509  Gross per 24 hour  Intake    320 ml  Output    150 ml  Net    170 ml    Exam:   General:  Pt is alert, follows commands appropriately, not in acute distress  Cardiovascular: Regular rate and rhythm,  no rubs, no gallops  Respiratory: Clear to auscultation bilaterally, rhonchi scattered   Abdomen: Soft, non tender, non distended, bowel sounds present, no guarding  Extremities: pulses DP and PT palpable bilaterally  Neuro: Grossly nonfocal  Data Reviewed: Basic Metabolic Panel:  Recent Labs Lab 01/11/16 1525 01/12/16 0410 01/16/16 1245 01/16/16 2122 01/17/16 0154 01/18/16 0544  NA 138 141 140  --  141 141  K 4.0 3.8 4.3  --  3.8 4.5  CL 102 104 106  --  106 106  CO2 26 26 26   --  26 23  GLUCOSE 292* 254* 225*  --  122* 49*  BUN 52* 50* 39*  --  35* 39*  CREATININE 2.21* 2.25* 1.85*  --  1.71* 1.97*  CALCIUM 8.4* 8.8* 9.1  --  8.9 9.0  MG  --   --   --  1.7  --   --   PHOS  --   --   --  2.2*  --   --    Liver Function Tests:  Recent Labs Lab 01/12/16 0410 01/16/16 1245 01/17/16 0154  AST 42* 25 19  ALT 30 22 18   ALKPHOS 261* 206* 180*  BILITOT 1.0 0.7 0.7  PROT 7.0 6.7 6.1*  ALBUMIN 3.4* 3.2* 2.9*   CBC:  Recent Labs Lab 01/12/16 0410 01/12/16 0950 01/15/16 1542 01/16/16 1245  01/17/16 0144 01/17/16 0154 01/17/16 0924 01/17/16 1714 01/18/16 0755  WBC 3.3* 3.7* 3.8* 4.9  --   --  4.4  --   --  5.2  NEUTROABS 1.8  --  2.3  --   --   --   --   --   --   --   HGB 11.0* 11.4* 9.5* 9.5*  < > 9.1*  9.3* 9.4* 8.5* 8.9*  HCT 35.3* 35.6* 29.1* 30.5*  < > 28.9* 29.0* 30.0* 27.3* 27.1*  MCV 84.9 85.0 84.5 84.5  --   --  84.3  --   --  84.2  PLT 235 228 223.0 240  --   --  282  --   --  242  < > = values in this interval not displayed. CBG:  Recent Labs Lab 01/17/16 1636 01/17/16 2100 01/18/16 0840 01/18/16 0917 01/18/16 1205  GLUCAP 183* 220* 49* 85 129*   Scheduled Meds: . amLODipine  5 mg Oral Daily  . aspirin  EC  81 mg Oral Daily  . atorvastatin  40 mg Oral q1800  . cefTRIAXone  IV  1 g Intravenous Q24H  . hydrALAZINE  25 mg Oral 3 times per day  . insulin aspart  0-5 Units Subcutaneous QHS  . insulin aspart  0-9 Units Subcutaneous TID WC  . insulin glargine  7 Units Subcutaneous QHS  . isosorbide mononitrate  60 mg Oral Daily  . magnesium oxide  400 mg Oral BID  . metoprolol succinate  25 mg Oral Daily  . pantoprazole  IV  40 mg Intravenous Q12H  . torsemide  40 mg Oral Daily   Continuous Infusions:

## 2016-01-18 NOTE — Progress Notes (Signed)
Patient ID: [name], female   DOB: [birth year], 82 y.o.   MRN: [id number]    Progress Note   Subjective  Smiling, no c/o except SOB when asked , coughing  No active bleeding hgb 8.9   Objective   Vital signs in last 24 hours: Temp:  [98.2 F (36.8 C)-98.6 F (37 C)] 98.2 F (36.8 C) (04/07 0420) Pulse Rate:  [61-68] 61 (04/07 0420) Resp:  [18-20] 20 (04/07 0420) BP: (118-139)/(49-57) 125/49 mmHg (04/07 0420) SpO2:  [100 %] 100 % (04/07 0420) Weight:  [143 lb 1.3 oz (64.9 kg)] 143 lb 1.3 oz (64.9 kg) (04/07 0700) Last BM Date: 01/17/16 General:    Elderly AA  female in NAD Heart:  Regular rate and rhythm; no murmurs Lungs: Respirations even scattered rhonchi , and rales at bases Abdomen:  Soft, nontender and nondistended. Normal bowel sounds. Extremities:  Without edema. Neurologic:  Alert and oriented,  grossly normal neurologically. Psych:  Cooperative. Normal mood and affect.  Intake/Output from previous day: 04/06 0701 - 04/07 0700 In: 440 [P.O.:390; IV Piggyback:50] Out: 150 [Urine:150] Intake/Output this shift:    Lab Results:  Recent Labs  01/16/16 1245  01/17/16 0154 01/17/16 0924 01/17/16 1714 01/18/16 0755  WBC 4.9  --  4.4  --   --  PENDING  HGB 9.5*  < > 9.3* 9.4* 8.5* 8.9*  HCT 30.5*  < > 29.0* 30.0* 27.3* 27.1*  PLT 240  --  282  --   --  242  < > = values in this interval not displayed. BMET  Recent Labs  01/16/16 1245 01/17/16 0154 01/18/16 0544  NA 140 141 141  K 4.3 3.8 4.5  CL 106 106 106  CO2 26 26 23   GLUCOSE 225* 122* 49*  BUN 39* 35* 39*  CREATININE 1.85* 1.71* 1.97*  CALCIUM 9.1 8.9 9.0   LFT  Recent Labs  01/17/16 0154  PROT 6.1*  ALBUMIN 2.9*  AST 19  ALT 18  ALKPHOS 180*  BILITOT 0.7   PT/INR  Recent Labs  01/16/16 2122  LABPROT 15.8*  INR 1.24    Studies/Results: X-ray Chest Pa And Lateral  01/16/2016  CLINICAL DATA:  History of rectal bleeding, congestive heart failure, diabetes, chronic renal  disease EXAM: CHEST  2 VIEW COMPARISON:  Chest x-ray of 01/12/2016 FINDINGS: No pneumonia is seen. There is moderate pulmonary vascular congestion however with moderate cardiomegaly. Tiny pleural effusions cannot be excluded. The thoracic vertebrae are normal alignment. IMPRESSION: Cardiomegaly mild pulmonary vascular congestion. Electronically Signed   By: Ivar Drape M.D.   On: 01/16/2016 17:20       Assessment / Plan:    #1 82 yo female with recurrent GI bleed likely secondary to AVM's -no active bleeding #2 anemia- hgb drifting slowly- transfuse for less than 8.5 #3 CHF- active- check BNP- needs continued diuresis #4 URI  Plan; Not ready for EGD/enteroscopy Regular diet  transfuse as needed   Work on cardio-pulm disease  Over next few days then endoscopic eval   Principal Problem:   GI bleed Active Problems:   DM (diabetes mellitus), type 2, uncontrolled ([place])   Iron deficiency anemia secondary to blood loss (chronic)   Essential hypertension   Chronic diastolic congestive heart failure (HCC)   GERD   CAD (coronary artery disease)   Irregular heart rhythm   NSTEMI (non-ST elevated myocardial infarction) (HCC)   Anemia   Acute on chronic combined systolic and diastolic heart failure ([place])  CKD (chronic kidney disease)     LOS: 2 days   Amy Esterwood  01/18/2016, 9:56 AM  I have discussed the case with the PA, and that is the plan I formulated.  CC: melena and ABLA

## 2016-01-19 LAB — COMPREHENSIVE METABOLIC PANEL
ALBUMIN: 2.5 g/dL — AB (ref 3.5–5.0)
ALK PHOS: 160 U/L — AB (ref 38–126)
ALT: 14 U/L (ref 14–54)
AST: 18 U/L (ref 15–41)
Anion gap: 11 (ref 5–15)
BILIRUBIN TOTAL: 0.9 mg/dL (ref 0.3–1.2)
BUN: 38 mg/dL — ABNORMAL HIGH (ref 6–20)
CHLORIDE: 102 mmol/L (ref 101–111)
CO2: 26 mmol/L (ref 22–32)
CREATININE: 2.05 mg/dL — AB (ref 0.44–1.00)
Calcium: 8.7 mg/dL — ABNORMAL LOW (ref 8.9–10.3)
GFR calc non Af Amer: 22 mL/min — ABNORMAL LOW (ref >60)
GFR, EST AFRICAN AMERICAN: 25 mL/min — AB (ref >60)
Glucose, Bld: 204 mg/dL — ABNORMAL HIGH (ref 65–99)
POTASSIUM: 4.2 mmol/L (ref 3.5–5.1)
SODIUM: 139 mmol/L (ref 135–145)
Total Protein: 5.7 g/dL — ABNORMAL LOW (ref 6.5–8.1)

## 2016-01-19 LAB — CBC
HCT: 27.6 % — ABNORMAL LOW (ref 36.0–46.0)
Hemoglobin: 8.6 g/dL — ABNORMAL LOW (ref 12.0–15.0)
MCH: 26.5 pg (ref 26.0–34.0)
MCHC: 31.2 g/dL (ref 30.0–36.0)
MCV: 84.9 fL (ref 78.0–100.0)
PLATELETS: 239 10*3/uL (ref 150–400)
RBC: 3.25 MIL/uL — AB (ref 3.87–5.11)
RDW: 16.8 % — ABNORMAL HIGH (ref 11.5–15.5)
WBC: 4.7 10*3/uL (ref 4.0–10.5)

## 2016-01-19 LAB — GLUCOSE, CAPILLARY
GLUCOSE-CAPILLARY: 221 mg/dL — AB (ref 65–99)
GLUCOSE-CAPILLARY: 264 mg/dL — AB (ref 65–99)
Glucose-Capillary: 187 mg/dL — ABNORMAL HIGH (ref 65–99)
Glucose-Capillary: 221 mg/dL — ABNORMAL HIGH (ref 65–99)

## 2016-01-19 NOTE — Progress Notes (Signed)
Patient Name: [name] Date of Encounter: 01/19/2016  Principal Problem:   GI bleed Active Problems:   DM (diabetes mellitus), type 2, uncontrolled ([place])   Iron deficiency anemia secondary to blood loss (chronic)   Essential hypertension   Chronic diastolic congestive heart failure (HCC)   GERD   CAD (coronary artery disease)   Irregular heart rhythm   NSTEMI (non-ST elevated myocardial infarction) (HCC)   Anemia   Acute on chronic combined systolic and diastolic heart failure (HCC)   CKD (chronic kidney disease)   Acute blood loss anemia   Length of Stay: 3  SUBJECTIVE  The patient continues to cough and feel SOB.  CURRENT MEDS . sodium chloride   Intravenous Once  . amLODipine  5 mg Oral Daily  . aspirin EC  81 mg Oral Daily  . atorvastatin  40 mg Oral q1800  . cefTRIAXone (ROCEPHIN)  IV  1 g Intravenous Q24H  . dextromethorphan-guaiFENesin  1 tablet Oral BID  . guaiFENesin-dextromethorphan  5 mL Oral Q6H  . hydrALAZINE  25 mg Oral 3 times per day  . insulin aspart  0-5 Units Subcutaneous QHS  . insulin aspart  0-9 Units Subcutaneous TID WC  . isosorbide mononitrate  60 mg Oral Daily  . magnesium oxide  400 mg Oral BID  . metoprolol succinate  25 mg Oral Daily  . mometasone-formoterol  2 puff Inhalation BID  . pantoprazole (PROTONIX) IV  40 mg Intravenous Q12H  . torsemide  40 mg Oral Daily   OBJECTIVE  Filed Vitals:   01/18/16 1356 01/18/16 2035 01/19/16 0447 01/19/16 0943  BP: 124/52 124/44 121/42   Pulse: 65 68 64   Temp: 97.8 F (36.6 C) 98.8 F (37.1 C) 98.3 F (36.8 C)   TempSrc: Oral Oral Oral   Resp: 17 20 20    Height:      Weight:      SpO2: 99% 100% 94% 93%    Intake/Output Summary (Last 24 hours) at 01/19/16 1104 Last data filed at 01/19/16 0952  Gross per 24 hour  Intake    450 ml  Output    850 ml  Net   -400 ml   Filed Weights   01/17/16 0439 01/18/16 0700  Weight: 145 lb 4.5 oz (65.9 kg) 143 lb 1.3 oz (64.9 kg)   PHYSICAL  EXAM  General: Pleasant, NAD. Neuro: Alert and oriented X 3. Moves all extremities spontaneously. Psych: Normal affect. HEENT:  Normal  Neck: Supple without bruits or JVD. Lungs:  Resp regular and unlabored, Crackles at both bases. Heart: RRR no s3, s4, or murmurs. Abdomen: Soft, non-tender, non-distended, BS + x 4.  Extremities: No clubbing, cyanosis or edema. DP/PT/Radials 2+ and equal bilaterally.  Accessory Clinical Findings  CBC  Recent Labs  01/18/16 0755 01/19/16 0651  WBC 5.2 4.7  HGB 8.9* 8.6*  HCT 27.1* 27.6*  MCV 84.2 84.9  PLT 242 [id number]   Basic Metabolic Panel  Recent Labs  01/16/16 2122  01/18/16 0544 01/19/16 0651  NA  --   < > 141 139  K  --   < > 4.5 4.2  CL  --   < > 106 102  CO2  --   < > 23 26  GLUCOSE  --   < > 49* 204*  BUN  --   < > 39* 38*  CREATININE  --   < > 1.97* 2.05*  CALCIUM  --   < > 9.0 8.7*  MG 1.7  --   --   --   PHOS 2.2*  --   --   --   < > = values in this interval not displayed. Liver Function Tests  Recent Labs  01/17/16 0154 01/19/16 0651  AST 19 18  ALT 18 14  ALKPHOS 180* 160*  BILITOT 0.7 0.9  PROT 6.1* 5.7*  ALBUMIN 2.9* 2.5*   X-ray Chest Pa And Lateral  01/16/2016  CLINICAL DATA:  History of rectal bleeding, congestive heart failure, diabetes, chronic renal disease EXAM: CHEST  2 VIEW COMPARISON:  Chest x-ray of 01/12/2016 FINDINGS: No pneumonia is seen. There is moderate pulmonary vascular congestion however with moderate cardiomegaly. Tiny pleural effusions cannot be excluded. The thoracic vertebrae are normal alignment. IMPRESSION: Cardiomegaly mild pulmonary vascular congestion. Electronically Signed   By: Ivar Drape M.D.   On: 01/16/2016 17:20   Dg Chest 2 View  01/12/2016  CLINICAL DATA:  Pulmonary edema EXAM: CHEST  2 VIEW COMPARISON:  12/20/2015 FINDINGS: The heart is moderately enlarged. Mild diffuse pulmonary edema. Tiny pleural effusions. No pneumothorax. IMPRESSION: Mild CHF.  Tiny pleural effusions.  Electronically Signed   By: Marybelle Killings M.D.   On: 01/12/2016 11:45   Dg Chest 2 View  12/20/2015  CLINICAL DATA:  For 3 years patient has been severely SOB. Patient states she can not remember a lot. HX CHF, CAD, HTN, Diabetes, myocardial infarction, pneumonia, stroke 2011, cardiac catheterization EXAM: CHEST  2 VIEW COMPARISON:  11/04/2015 FINDINGS: Heart size is markedly enlarged but stable in appearance. Interstitial edema again noted. Small right pleural effusion. There are no focal consolidations. Visualized osseous structures have a normal appearance. IMPRESSION: Stable marked enlargement of the heart/pericardial silhouette. CHF/interstitial edema and small right effusion. Electronically Signed   By: Nolon Nations M.D.   On: 12/20/2015 17:55   Tele:  NSR    ASSESSMENT AND PLAN  1. Chronic Combined systolic and diastolic congestive heart failure (HCC) - Current medications BB, IMDUR, Demadex - Pt appears to be euvolemic at present   Continue current meds   2. DM (diabetes mellitus), type 2, uncontrolled ([place]) -Currently in Long and Short Acting Insulin -Cbgs controlled  3. Essential hypertension -Controlled on current medications  4. CAD (coronary artery disease)  5. Anemia -Hgb dropped from 11.4-9.5 over the past 5 days, but has maintained at 9.5 throughout last couple of lab draws -GI currently following pt, and possible upper endoscopy planned during current admission.  7. CKD (chronic kidney disease) -Cr is improved at this visit, as noted in the HPI  8. Bronchitis  - still has lots of wheezing and rhonchi.  Agree with nebs.     Lavalle Skoda, Wonda Cheng, MD  01/19/2016 11:07 AM    [address],  [place], [place]  [address] Pager [phone] Phone: [phone]; Fax: [phone]

## 2016-01-19 NOTE — Progress Notes (Signed)
Hgb remains stable at 8.6.  Patient's respiratory status reportedly still fair, with bronchitis/rhonchi and wheezing.  Small bowel enteroscopy plan is on hold pending significant improvement in respiratory function to handle sedation.

## 2016-01-19 NOTE — Progress Notes (Addendum)
Patient ID: [name], female   DOB: 19-Jul-1933, 82 y.o.   MRN: [id number]  [facility] PROGRESS NOTE  [name] [id number] DOB: 16-Jun-1933 DOA: 01/16/2016 PCP: [name], MD   Brief narrative:    82 y.o. 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 She was seen at hospital 01/12/16 for evaluation of dark stools for which she was to continue workup as OP. It was noted that hemoglobin was 11.5 at that time however, this admission, hemoglobin was 9.3. GI consulted but deferred scope due to rhonchi on lung exam and [facility] consulted cardiology. Now waiting  on optimizing patient from a pulmonary and cardiology standpoint  Assessment/Plan:    Acute blood loss anemia, GI bleed, Angiodysplasia of intestine with suspected hemorrhage  - GI consulted, scoping on hold for now until heart issues resolved , Cardiology following - on PPI already, no active bleeding, hemoglobin stable Defer endoscopic evaluation to GI  Clinically appears to be stable from a pulmonary standpoint  Chronic Combined systolic and diastolic congestive heart failure (HCC) - Current medications BB, IMDUR, Demadex Continue Lasix> now changed to Demadex, Zaroxolyn ;ast dose 4/5 - weight is 65.9 kg this AM>64.9 kg  Appear to be stable from a cardiopulmonary standpoint Added Robitussin-DM, Dulera for possible bronchitis   ? UTI - UA and urine culture requested as pt with dysuria and malodorous urine - placed on empiric Rocephin No urine culture from this admission Continue antibiotics for a total of 5 days  DM (diabetes mellitus), type 2, uncontrolled (HCC) Hypoglycemic this morning, discontinue Lantus  Essential hypertension - reasonable inpatient controlled  CAD (coronary artery disease) - asymptomatic   CKD (chronic kidney  disease), stage IV - Crea 1.7--1.9, baseline 1.8 - 2.2. - BMP in AM  DVT prophylaxis - SCD's  Code Status: Full.  Family Communication:  plan of care discussed with the patient Disposition Plan: Eventually will be discharged with home health when stable for discharge next week   IV access:  Peripheral IV  Procedures and diagnostic studies:    X-ray Chest Pa And Lateral  01/16/2016  CLINICAL DATA:  History of rectal bleeding, congestive heart failure, diabetes, chronic renal disease EXAM: CHEST  2 VIEW COMPARISON:  Chest x-ray of 01/12/2016 FINDINGS: No pneumonia is seen. There is moderate pulmonary vascular congestion however with moderate cardiomegaly. Tiny pleural effusions cannot be excluded. The thoracic vertebrae are normal alignment. IMPRESSION: Cardiomegaly mild pulmonary vascular congestion. Electronically Signed   By: Ivar Drape M.D.   On: 01/16/2016 17:20   Dg Chest 2 View  01/12/2016  CLINICAL DATA:  Pulmonary edema EXAM: CHEST  2 VIEW COMPARISON:  12/20/2015 FINDINGS: The heart is moderately enlarged. Mild diffuse pulmonary edema. Tiny pleural effusions. No pneumothorax. IMPRESSION: Mild CHF.  Tiny pleural effusions. Electronically Signed   By: Marybelle Killings M.D.   On: 01/12/2016 11:45   Dg Chest 2 View  12/20/2015  CLINICAL DATA:  For 3 years patient has been severely SOB. Patient states she can not remember a lot. HX CHF, CAD, HTN, Diabetes, myocardial infarction, pneumonia, stroke 2011, cardiac catheterization EXAM: CHEST  2 VIEW COMPARISON:  11/04/2015 FINDINGS: Heart size is markedly enlarged but stable in appearance. Interstitial edema again noted. Small right pleural effusion. There are no focal consolidations. Visualized osseous structures have a normal  appearance. IMPRESSION: Stable marked enlargement of the heart/pericardial silhouette. CHF/interstitial edema and small right effusion. Electronically Signed   By: Nolon Nations M.D.   On: 12/20/2015 17:55    Medical  Consultants:  GI Cardiology   Other Consultants:  None  IAnti-Infectives:   Ceftriaxone 4/6 -->  Reyne Dumas, MD  [facility] Pager [phone]  If 7PM-7AM, please contact night-coverage [email or web address] Password [email or web address] 01/19/2016, 9:24 AM   LOS: 3 days   HPI/Subjective: Denies any chest pain or shortness of breath, hypoglycemic this morning  Objective: Filed Vitals:   01/18/16 0700 01/18/16 1356 01/18/16 2035 01/19/16 0447  BP:  124/52 124/44 121/42  Pulse:  65 68 64  Temp:  97.8 F (36.6 C) 98.8 F (37.1 C) 98.3 F (36.8 C)  TempSrc:  Oral Oral Oral  Resp:  17 20 20   Height:      Weight: 64.9 kg (143 lb 1.3 oz)     SpO2:  99% 100% 94%    Intake/Output Summary (Last 24 hours) at 01/19/16 0924 Last data filed at 01/19/16 0525  Gross per 24 hour  Intake    450 ml  Output    350 ml  Net    100 ml    Exam:   General:  Pt is alert, follows commands appropriately, not in acute distress  Cardiovascular: Regular rate and rhythm,  no rubs, no gallops  Respiratory: Clear to auscultation bilaterally, rhonchi scattered   Abdomen: Soft, non tender, non distended, bowel sounds present, no guarding  Extremities: pulses DP and PT palpable bilaterally  Neuro: Grossly nonfocal  Data Reviewed: Basic Metabolic Panel:  Recent Labs Lab 01/16/16 1245 01/16/16 2122 01/17/16 0154 01/18/16 0544 01/19/16 0651  NA 140  --  141 141 139  K 4.3  --  3.8 4.5 4.2  CL 106  --  106 106 102  CO2 26  --  26 23 26   GLUCOSE 225*  --  122* 49* 204*  BUN 39*  --  35* 39* 38*  CREATININE 1.85*  --  1.71* 1.97* 2.05*  CALCIUM 9.1  --  8.9 9.0 8.7*  MG  --  1.7  --   --   --   PHOS  --  2.2*  --   --   --    Liver Function Tests:  Recent Labs Lab 01/16/16 1245 01/17/16 0154 01/19/16 0651  AST 25 19 18   ALT 22 18 14   ALKPHOS 206* 180* 160*  BILITOT 0.7 0.7 0.9  PROT 6.7 6.1* 5.7*  ALBUMIN 3.2* 2.9* 2.5*   CBC:  Recent Labs Lab 01/15/16 1542 01/16/16 1245  01/17/16 0154  01/17/16 0924 01/17/16 1714 01/18/16 0755 01/19/16 0651  WBC 3.8* 4.9  --  4.4  --   --  5.2 4.7  NEUTROABS 2.3  --   --   --   --   --   --   --   HGB 9.5* 9.5*  < > 9.3* 9.4* 8.5* 8.9* 8.6*  HCT 29.1* 30.5*  < > 29.0* 30.0* 27.3* 27.1* 27.6*  MCV 84.5 84.5  --  84.3  --   --  84.2 84.9  PLT 223.0 240  --  282  --   --  242 239  < > = values in this interval not displayed. CBG:  Recent Labs Lab 01/18/16 0917 01/18/16 1205 01/18/16 1656 01/18/16 2137 01/19/16 0854  GLUCAP 85 129* 317* 224* 187*   Scheduled Meds: . amLODipine  5 mg Oral Daily  .  aspirin EC  81 mg Oral Daily  . atorvastatin  40 mg Oral q1800  . cefTRIAXone  IV  1 g Intravenous Q24H  . hydrALAZINE  25 mg Oral 3 times per day  . insulin aspart  0-5 Units Subcutaneous QHS  . insulin aspart  0-9 Units Subcutaneous TID WC  . insulin glargine  7 Units Subcutaneous QHS  . isosorbide mononitrate  60 mg Oral Daily  . magnesium oxide  400 mg Oral BID  . metoprolol succinate  25 mg Oral Daily  . pantoprazole  IV  40 mg Intravenous Q12H  . torsemide  40 mg Oral Daily   Continuous Infusions:

## 2016-01-20 DIAGNOSIS — I5032 Chronic diastolic (congestive) heart failure: Secondary | ICD-10-CM

## 2016-01-20 DIAGNOSIS — K922 Gastrointestinal hemorrhage, unspecified: Secondary | ICD-10-CM

## 2016-01-20 LAB — COMPREHENSIVE METABOLIC PANEL
ALBUMIN: 2.5 g/dL — AB (ref 3.5–5.0)
ALK PHOS: 157 U/L — AB (ref 38–126)
ALT: 15 U/L (ref 14–54)
AST: 18 U/L (ref 15–41)
Anion gap: 11 (ref 5–15)
BILIRUBIN TOTAL: 0.8 mg/dL (ref 0.3–1.2)
BUN: 43 mg/dL — AB (ref 6–20)
CALCIUM: 8.9 mg/dL (ref 8.9–10.3)
CO2: 26 mmol/L (ref 22–32)
CREATININE: 2.35 mg/dL — AB (ref 0.44–1.00)
Chloride: 101 mmol/L (ref 101–111)
GFR calc Af Amer: 21 mL/min — ABNORMAL LOW (ref >60)
GFR calc non Af Amer: 18 mL/min — ABNORMAL LOW (ref >60)
GLUCOSE: 266 mg/dL — AB (ref 65–99)
Potassium: 4 mmol/L (ref 3.5–5.1)
SODIUM: 138 mmol/L (ref 135–145)
TOTAL PROTEIN: 5.7 g/dL — AB (ref 6.5–8.1)

## 2016-01-20 LAB — CBC
HCT: 27.4 % — ABNORMAL LOW (ref 36.0–46.0)
Hemoglobin: 8.8 g/dL — ABNORMAL LOW (ref 12.0–15.0)
MCH: 27.4 pg (ref 26.0–34.0)
MCHC: 32.1 g/dL (ref 30.0–36.0)
MCV: 85.4 fL (ref 78.0–100.0)
Platelets: 253 10*3/uL (ref 150–400)
RBC: 3.21 MIL/uL — ABNORMAL LOW (ref 3.87–5.11)
RDW: 16.9 % — AB (ref 11.5–15.5)
WBC: 4 10*3/uL (ref 4.0–10.5)

## 2016-01-20 LAB — TYPE AND SCREEN
ABO/RH(D): B POS
Antibody Screen: NEGATIVE
UNIT DIVISION: 0
UNIT DIVISION: 0

## 2016-01-20 LAB — GLUCOSE, CAPILLARY
GLUCOSE-CAPILLARY: 245 mg/dL — AB (ref 65–99)
GLUCOSE-CAPILLARY: 256 mg/dL — AB (ref 65–99)
GLUCOSE-CAPILLARY: 371 mg/dL — AB (ref 65–99)
GLUCOSE-CAPILLARY: 160 mg/dL — AB (ref 65–99)

## 2016-01-20 MED ORDER — DOXYCYCLINE HYCLATE 100 MG PO TABS
100.0000 mg | ORAL_TABLET | Freq: Two times a day (BID) | ORAL | Status: DC
  Administered 2016-01-20 – 2016-01-22 (×4): 100 mg via ORAL
  Filled 2016-01-20 (×4): qty 1

## 2016-01-20 NOTE — Progress Notes (Addendum)
Patient Name: [name] Date of Encounter: 01/20/2016  Principal Problem:   GI bleed Active Problems:   DM (diabetes mellitus), type 2, uncontrolled ([place])   Iron deficiency anemia secondary to blood loss (chronic)   Essential hypertension   Chronic diastolic congestive heart failure (HCC)   GERD   CAD (coronary artery disease)   Irregular heart rhythm   NSTEMI (non-ST elevated myocardial infarction) (HCC)   Anemia   Acute on chronic combined systolic and diastolic heart failure (HCC)   CKD (chronic kidney disease)   Acute blood loss anemia   Length of Stay: 4  SUBJECTIVE  The patient continues to cough and feel SOB.  CURRENT MEDS . sodium chloride   Intravenous Once  . amLODipine  5 mg Oral Daily  . aspirin EC  81 mg Oral Daily  . atorvastatin  40 mg Oral q1800  . cefTRIAXone (ROCEPHIN)  IV  1 g Intravenous Q24H  . dextromethorphan-guaiFENesin  1 tablet Oral BID  . guaiFENesin-dextromethorphan  5 mL Oral Q6H  . hydrALAZINE  25 mg Oral 3 times per day  . insulin aspart  0-5 Units Subcutaneous QHS  . insulin aspart  0-9 Units Subcutaneous TID WC  . isosorbide mononitrate  60 mg Oral Daily  . magnesium oxide  400 mg Oral BID  . metoprolol succinate  25 mg Oral Daily  . mometasone-formoterol  2 puff Inhalation BID  . pantoprazole (PROTONIX) IV  40 mg Intravenous Q12H  . torsemide  40 mg Oral Daily   OBJECTIVE  Filed Vitals:   01/19/16 0943 01/19/16 1538 01/19/16 2121 01/20/16 0439  BP:  108/51 119/49 119/46  Pulse:  67 67 68  Temp:  98.2 F (36.8 C) 98.3 F (36.8 C) 98.5 F (36.9 C)  TempSrc:  Oral  Oral  Resp:  20 16 16   Height:      Weight:      SpO2: 93% 100% 100% 98%    Intake/Output Summary (Last 24 hours) at 01/20/16 1013 Last data filed at 01/20/16 [id number]  Gross per 24 hour  Intake    200 ml  Output      0 ml  Net    200 ml   Filed Weights   01/17/16 0439 01/18/16 0700  Weight: 145 lb 4.5 oz (65.9 kg) 143 lb 1.3 oz (64.9 kg)   PHYSICAL  EXAM  General: Pleasant, NAD.  Very thin  Neuro: Alert and oriented X 3. Moves all extremities spontaneously. Psych: Normal affect. HEENT:  Normal  Neck: Supple without bruits or JVD. Lungs:  Resp regular and unlabored, better today  Heart: RRR no s3, s4, or murmurs. Abdomen: Soft, non-tender, non-distended, BS + x 4.  Extremities: No clubbing, cyanosis or edema. DP/PT/Radials 2+ and equal bilaterally.  Accessory Clinical Findings  CBC  Recent Labs  01/19/16 0651 01/20/16 0602  WBC 4.7 4.0  HGB 8.6* 8.8*  HCT 27.6* 27.4*  MCV 84.9 85.4  PLT 239 [id number]   Basic Metabolic Panel  Recent Labs  01/19/16 0651 01/20/16 0602  NA 139 138  K 4.2 4.0  CL 102 101  CO2 26 26  GLUCOSE 204* 266*  BUN 38* 43*  CREATININE 2.05* 2.35*  CALCIUM 8.7* 8.9   Liver Function Tests  Recent Labs  01/19/16 0651 01/20/16 0602  AST 18 18  ALT 14 15  ALKPHOS 160* 157*  BILITOT 0.9 0.8  PROT 5.7* 5.7*  ALBUMIN 2.5* 2.5*   X-ray Chest Pa And Lateral  01/16/2016  CLINICAL DATA:  History of rectal bleeding, congestive heart failure, diabetes, chronic renal disease EXAM: CHEST  2 VIEW COMPARISON:  Chest x-ray of 01/12/2016 FINDINGS: No pneumonia is seen. There is moderate pulmonary vascular congestion however with moderate cardiomegaly. Tiny pleural effusions cannot be excluded. The thoracic vertebrae are normal alignment. IMPRESSION: Cardiomegaly mild pulmonary vascular congestion. Electronically Signed   By: Ivar Drape M.D.   On: 01/16/2016 17:20   Dg Chest 2 View  01/12/2016  CLINICAL DATA:  Pulmonary edema EXAM: CHEST  2 VIEW COMPARISON:  12/20/2015 FINDINGS: The heart is moderately enlarged. Mild diffuse pulmonary edema. Tiny pleural effusions. No pneumothorax. IMPRESSION: Mild CHF.  Tiny pleural effusions. Electronically Signed   By: Marybelle Killings M.D.   On: 01/12/2016 11:45   Tele:  NSR    ASSESSMENT AND PLAN  1. Chronic Combined systolic and diastolic congestive heart failure  (HCC) - Current medications BB, IMDUR, Demadex - Pt appears to be euvolemic at present   Continue current meds   2. DM (diabetes mellitus), type 2, uncontrolled ([place]) -Currently in Long and Short Acting Insulin -Cbgs controlled  3. Essential hypertension -Controlled on current medications  4. CAD (coronary artery disease)  5. CKD (chronic kidney disease) -Cr is up slightly   6. Bronchitis  -  Wheezing is much better.     Nahser, Wonda Cheng, MD  01/20/2016 10:13 AM    [address],  [place], [place]  [address] Pager [phone] Phone: [phone]; Fax: [phone]

## 2016-01-20 NOTE — Progress Notes (Addendum)
Patient ID: MERCY CAMPUSANO, female   DOB: [birth year], 82 y.o.   MRN: [id number]  [facility] PROGRESS NOTE  JACKEY FORNSHELL [id number] DOB: 03/01/1933 DOA: 01/16/2016 PCP: [name], MD   Brief narrative:    82 y.o. 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 She was seen at hospital 01/12/16 for evaluation of dark stools for which she was to continue workup as OP. It was noted that hemoglobin was 11.5 at that time however, this admission, hemoglobin was 9.3. GI consulted but deferred scope due to rhonchi on lung exam and [facility] consulted cardiology. Now waiting  on optimizing patient from a pulmonary and cardiology standpoint  Assessment/Plan:    Acute blood loss anemia, GI bleed, Angiodysplasia of intestine with suspected hemorrhage  - GI consulted, scoping on hold for now until heart issues resolved , Cardiology following - on PPI already, no active bleeding, hemoglobin stable -gi plan to scope on 4/10, will f/u gi rec's  Chronic Combined systolic and diastolic congestive heart failure (HCC) - Current medications BB, IMDUR, Demadex Continue Lasix> now changed to Demadex, Zaroxolyn last dose 4/5 - weight trending down, euvolemic on exam, cardiology following  Bronchitis? Does has productive cough, lung exam benign, no sob, no chest pain, on room air On abx,  Robitussin-DM, Dulera for possible bronchitis   ? UTI - UA and urine culture requested as pt with dysuria and malodorous urine - placed on empiric Rocephin on 4/6, changed to oral abx -Continue antibiotics for a total of 5 days -4/9 patient denies urinary symptom  Insulin dependent DM (diabetes mellitus), type 2, uncontrolled ([place]) Had Hypoglycemic on 4/7, discontinue Lantus, currently on ssi, check a1c  Essential hypertension - reasonable  inpatient controlled  CAD (coronary artery disease) - asymptomatic   CKD (chronic kidney disease), stage IV - Crea 1.7--1.9, baseline 1.8 - 2.2. - BMP in AM  DVT prophylaxis - SCD's  Code Status: Full.  Family Communication:  plan of care discussed with the patient Disposition Plan: Eventually will be discharged with home health when stable for discharge next week   IV access:  Peripheral IV  Procedures and diagnostic studies:    X-ray Chest Pa And Lateral  01/16/2016  CLINICAL DATA:  History of rectal bleeding, congestive heart failure, diabetes, chronic renal disease EXAM: CHEST  2 VIEW COMPARISON:  Chest x-ray of 01/12/2016 FINDINGS: No pneumonia is seen. There is moderate pulmonary vascular congestion however with moderate cardiomegaly. Tiny pleural effusions cannot be excluded. The thoracic vertebrae are normal alignment. IMPRESSION: Cardiomegaly mild pulmonary vascular congestion. Electronically Signed   By: Ivar Drape M.D.   On: 01/16/2016 17:20   Dg Chest 2 View  01/12/2016  CLINICAL DATA:  Pulmonary edema EXAM: CHEST  2 VIEW COMPARISON:  12/20/2015 FINDINGS: The heart is moderately enlarged. Mild diffuse pulmonary edema. Tiny pleural effusions. No pneumothorax. IMPRESSION: Mild CHF.  Tiny pleural effusions. Electronically Signed   By: Marybelle Killings M.D.   On: 01/12/2016 11:45    Medical Consultants:  GI Cardiology   Other Consultants:  None  IAnti-Infectives:   Ceftriaxone 4/6 -->4/9 Doxycycline from 4/9  Karman Biswell, MD PhD [facility] Pager [phone]  If 7PM-7AM, please contact night-coverage [email or web address] Password [email or web address] 01/20/2016, 12:42 PM   LOS: 4 days   HPI/Subjective: C/o productive cough, denies pain, no sob,  on room air, sitting in chair, NAD, very pleasant, mild baseline dementia  Objective: Filed Vitals:   01/19/16 0943 01/19/16 1538 01/19/16 2121 01/20/16 0439  BP:  108/51 119/49 119/46  Pulse:  67 67 68  Temp:  98.2 F (36.8 C) 98.3 F (36.8 C) 98.5  F (36.9 C)  TempSrc:  Oral  Oral  Resp:  20 16 16   Height:      Weight:      SpO2: 93% 100% 100% 98%    Intake/Output Summary (Last 24 hours) at 01/20/16 1242 Last data filed at 01/20/16 [id number]  Gross per 24 hour  Intake    200 ml  Output      0 ml  Net    200 ml    Exam:   General:  Pt is alert, follows commands appropriately, not in acute distress, pleasant, frail elderly  Cardiovascular: Regular rate and rhythm,  no rubs, no gallops  Respiratory: Clear to auscultation bilaterally, rhonchi scattered   Abdomen: Soft, non tender, non distended, bowel sounds present, no guarding  Extremities: pulses DP and PT palpable bilaterally  Neuro: Grossly nonfocal, baseline mild dementia, not oriented to year/month, but oriented to place, person and situation, know she is going to have a procedure done  Data Reviewed: Basic Metabolic Panel:  Recent Labs Lab 01/16/16 1245 01/16/16 2122 01/17/16 0154 01/18/16 0544 01/19/16 0651 01/20/16 0602  NA 140  --  141 141 139 138  K 4.3  --  3.8 4.5 4.2 4.0  CL 106  --  106 106 102 101  CO2 26  --  26 23 26 26   GLUCOSE 225*  --  122* 49* 204* 266*  BUN 39*  --  35* 39* 38* 43*  CREATININE 1.85*  --  1.71* 1.97* 2.05* 2.35*  CALCIUM 9.1  --  8.9 9.0 8.7* 8.9  MG  --  1.7  --   --   --   --   PHOS  --  2.2*  --   --   --   --    Liver Function Tests:  Recent Labs Lab 01/16/16 1245 01/17/16 0154 01/19/16 0651 01/20/16 0602  AST 25 19 18 18   ALT 22 18 14 15   ALKPHOS 206* 180* 160* 157*  BILITOT 0.7 0.7 0.9 0.8  PROT 6.7 6.1* 5.7* 5.7*  ALBUMIN 3.2* 2.9* 2.5* 2.5*   CBC:  Recent Labs Lab 01/15/16 1542 01/16/16 1245  01/17/16 0154 01/17/16 0924 01/17/16 1714 01/18/16 0755 01/19/16 0651 01/20/16 0602  WBC 3.8* 4.9  --  4.4  --   --  5.2 4.7 4.0  NEUTROABS 2.3  --   --   --   --   --   --   --   --   HGB 9.5* 9.5*  < > 9.3* 9.4* 8.5* 8.9* 8.6* 8.8*  HCT 29.1* 30.5*  < > 29.0* 30.0* 27.3* 27.1* 27.6* 27.4*  MCV  84.5 84.5  --  84.3  --   --  84.2 84.9 85.4  PLT 223.0 240  --  282  --   --  242 239 253  < > = values in this interval not displayed. CBG:  Recent Labs Lab 01/19/16 1214 01/19/16 1719 01/19/16 2120 01/20/16 0755 01/20/16 1208  GLUCAP 221* 221* 264* 245* 256*   Scheduled Meds: . amLODipine  5 mg Oral Daily  . aspirin EC  81 mg Oral Daily  . atorvastatin  40 mg Oral q1800  . cefTRIAXone  IV  1 g Intravenous Q24H  . hydrALAZINE  25 mg Oral 3 times per day  . insulin aspart  0-5 Units Subcutaneous QHS  . insulin aspart  0-9 Units Subcutaneous TID WC  . insulin glargine  7 Units Subcutaneous QHS  . isosorbide mononitrate  60 mg Oral Daily  . magnesium oxide  400 mg Oral BID  . metoprolol succinate  25 mg Oral Daily  . pantoprazole  IV  40 mg Intravenous Q12H  . torsemide  40 mg Oral Daily   Continuous Infusions:

## 2016-01-20 NOTE — Progress Notes (Signed)
Patient ID: [name], female   DOB: 04-04-1933, 82 y.o.   MRN: [id number]    Progress Note   Subjective   In good spirits, feels better, no SOB HGB 8.8 stable No stool today, she is not sure if stools still black   Objective   Vital signs in last 24 hours: Temp:  [98.2 F (36.8 C)-98.5 F (36.9 C)] 98.5 F (36.9 C) (04/09 0439) Pulse Rate:  [67-68] 68 (04/09 0439) Resp:  [16-20] 16 (04/09 0439) BP: (108-119)/(46-51) 119/46 mmHg (04/09 0439) SpO2:  [98 %-100 %] 98 % (04/09 0439) Last BM Date: 01/19/16 General:    Elderly AA  female in NAD, smiling Heart:  Regular rate and rhythm; no murmurs Lungs: Respirations even and unlabored, lungs CTA bilaterally Abdomen:  Soft, nontender and nondistended. Normal bowel sounds. Extremities:  Without edema. Neurologic:  Alert and oriented,  grossly normal neurologically. Psych:  Cooperative. Normal mood and affect.  Intake/Output from previous day: 04/08 0701 - 04/09 0700 In: 200 [P.O.:150; IV Piggyback:50] Out: 650 [Urine:650] Intake/Output this shift:    Lab Results:  Recent Labs  01/18/16 0755 01/19/16 0651 01/20/16 0602  WBC 5.2 4.7 4.0  HGB 8.9* 8.6* 8.8*  HCT 27.1* 27.6* 27.4*  PLT 242 239 253   BMET  Recent Labs  01/18/16 0544 01/19/16 0651 01/20/16 0602  NA 141 139 138  K 4.5 4.2 4.0  CL 106 102 101  CO2 23 26 26   GLUCOSE 49* 204* 266*  BUN 39* 38* 43*  CREATININE 1.97* 2.05* 2.35*  CALCIUM 9.0 8.7* 8.9   LFT  Recent Labs  01/20/16 0602  PROT 5.7*  ALBUMIN 2.5*  AST 18  ALT 15  ALKPHOS 157*  BILITOT 0.8   PT/INR No results for input(s): LABPROT, INR in the last 72 hours.    Assessment / Plan:    #1 82 yo female with recurrent GI bleeding and hx of small bowel AVM's- she is s/p ablation of proximal jejunal AVM's on two occasions in the past the last 07/2014 Pt admitted 4/5 after a few days of melena but was also in pulmonary edema She has not had active bleeding the past couple days-  hgb stable Cardiology feels she is euvolemic  Now- and pt feels much better  #2 CHF ef20-25% #3 IDDM #4 CAD s/p MI #5 CKD #6 hx CVA #7 hx afib  Plan;   Will schedule for Enteroscopy with Dr Havery Moros tomorrow, NPO after midnight  Discussed with pt and family  Continue daily hgb  Principal Problem:   GI bleed Active Problems:   DM (diabetes mellitus), type 2, uncontrolled ([place])   Iron deficiency anemia secondary to blood loss (chronic)   Essential hypertension   Chronic diastolic congestive heart failure (HCC)   GERD   CAD (coronary artery disease)   Irregular heart rhythm   NSTEMI (non-ST elevated myocardial infarction) ([place])   Anemia   Acute on chronic combined systolic and diastolic heart failure (HCC)   CKD (chronic kidney disease)   Acute blood loss anemia     LOS: 4 days   Amy Esterwood  01/20/2016, 10:54 AM  I have discussed the case with the PA, and that is the plan I formulated.  CC: melena and ABLA

## 2016-01-21 ENCOUNTER — Inpatient Hospital Stay (HOSPITAL_COMMUNITY): Payer: Commercial Managed Care - HMO | Admitting: Certified Registered"

## 2016-01-21 ENCOUNTER — Encounter (HOSPITAL_COMMUNITY)
Admission: EM | Disposition: A | Discharge status: Home Health | Payer: Self-pay | Source: Home / Self Care | Attending: Internal Medicine

## 2016-01-21 ENCOUNTER — Encounter (HOSPITAL_COMMUNITY): Payer: Self-pay | Admitting: *Deleted

## 2016-01-21 DIAGNOSIS — N183 Chronic kidney disease, stage 3 (moderate): Secondary | ICD-10-CM

## 2016-01-21 DIAGNOSIS — IMO0001 Reserved for inherently not codable concepts without codable children: Secondary | ICD-10-CM | POA: Insufficient documentation

## 2016-01-21 DIAGNOSIS — E119 Type 2 diabetes mellitus without complications: Secondary | ICD-10-CM

## 2016-01-21 DIAGNOSIS — Z794 Long term (current) use of insulin: Secondary | ICD-10-CM

## 2016-01-21 HISTORY — PX: ENTEROSCOPY: SHX5533

## 2016-01-21 LAB — CBC
HEMATOCRIT: 27.5 % — AB (ref 36.0–46.0)
Hemoglobin: 9 g/dL — ABNORMAL LOW (ref 12.0–15.0)
MCH: 28 pg (ref 26.0–34.0)
MCHC: 32.7 g/dL (ref 30.0–36.0)
MCV: 85.4 fL (ref 78.0–100.0)
Platelets: 260 10*3/uL (ref 150–400)
RBC: 3.22 MIL/uL — ABNORMAL LOW (ref 3.87–5.11)
RDW: 16.9 % — AB (ref 11.5–15.5)
WBC: 4.4 10*3/uL (ref 4.0–10.5)

## 2016-01-21 LAB — COMPREHENSIVE METABOLIC PANEL
ALBUMIN: 2.8 g/dL — AB (ref 3.5–5.0)
ALT: 14 U/L (ref 14–54)
AST: 16 U/L (ref 15–41)
Alkaline Phosphatase: 154 U/L — ABNORMAL HIGH (ref 38–126)
Anion gap: 13 (ref 5–15)
BUN: 48 mg/dL — AB (ref 6–20)
CHLORIDE: 101 mmol/L (ref 101–111)
CO2: 25 mmol/L (ref 22–32)
Calcium: 9.1 mg/dL (ref 8.9–10.3)
Creatinine, Ser: 2.32 mg/dL — ABNORMAL HIGH (ref 0.44–1.00)
GFR calc Af Amer: 22 mL/min — ABNORMAL LOW (ref >60)
GFR, EST NON AFRICAN AMERICAN: 19 mL/min — AB (ref >60)
Glucose, Bld: 252 mg/dL — ABNORMAL HIGH (ref 65–99)
POTASSIUM: 3.9 mmol/L (ref 3.5–5.1)
Sodium: 139 mmol/L (ref 135–145)
Total Bilirubin: 0.7 mg/dL (ref 0.3–1.2)
Total Protein: 6 g/dL — ABNORMAL LOW (ref 6.5–8.1)

## 2016-01-21 LAB — GLUCOSE, CAPILLARY
GLUCOSE-CAPILLARY: 148 mg/dL — AB (ref 65–99)
GLUCOSE-CAPILLARY: 174 mg/dL — AB (ref 65–99)
Glucose-Capillary: 246 mg/dL — ABNORMAL HIGH (ref 65–99)
Glucose-Capillary: 195 mg/dL — ABNORMAL HIGH (ref 65–99)

## 2016-01-21 SURGERY — ENTEROSCOPY
Anesthesia: Monitor Anesthesia Care

## 2016-01-21 MED ORDER — ETOMIDATE 2 MG/ML IV SOLN
INTRAVENOUS | Status: DC | PRN
  Administered 2016-01-21 (×2): 4 mg via INTRAVENOUS
  Administered 2016-01-21: 6 mg via INTRAVENOUS

## 2016-01-21 MED ORDER — BUTAMBEN-TETRACAINE-BENZOCAINE 2-2-14 % EX AERO
INHALATION_SPRAY | CUTANEOUS | Status: DC | PRN
  Administered 2016-01-21: 2 via TOPICAL

## 2016-01-21 MED ORDER — LACTATED RINGERS IV SOLN
INTRAVENOUS | Status: DC
  Administered 2016-01-21: 1000 mL via INTRAVENOUS
  Administered 2016-01-21 – 2016-01-22 (×2): via INTRAVENOUS

## 2016-01-21 NOTE — Progress Notes (Signed)
Inpatient Diabetes Program Recommendations  AACE/ADA: New Consensus Statement on Inpatient Glycemic Control (2015)  Target Ranges:  Prepandial:   less than 140 mg/dL      Peak postprandial:   less than 180 mg/dL (1-2 hours)      Critically ill patients:  140 - 180 mg/dL   Results for [name], [name] (MRN [id number]) as of 01/21/2016 10:16  Ref. Range 01/20/2016 07:55 01/20/2016 12:08 01/20/2016 16:41 01/20/2016 22:22 01/21/2016 08:08  Glucose-Capillary Latest Ref Range: 65-99 mg/dL 245 (H) 256 (H) 371 (H) 160 (H) 246 (H)   Review of Glycemic Control  Diabetes history: DM 2 Outpatient Diabetes medications: Lantus 14 units BID, Novolog 2-5 units TID Current orders for Inpatient glycemic control: Novolog Sensitive TID + HS scale  Inpatient Diabetes Program Recommendations: Insulin - Basal: Renal function still elevated, however, glucose trends have finally come up after patient was hypoglycemic on Friday. Consider restarting basal insulin Levemir 5 units Q 24hrs (Body does not hold onto it like Lantus during renal impairment).  Thanks,  Tama Headings RN, MSN, [facility] Inpatient Diabetes Coordinator Team Pager [phone] (8a-5p)

## 2016-01-21 NOTE — H&P (View-Only) (Signed)
Patient ID: [name], female   DOB: 01/04/1933, 82 y.o.   MRN: [id number]    Progress Note   Subjective   In good spirits, feels better, no SOB HGB 8.8 stable No stool today, she is not sure if stools still black   Objective   Vital signs in last 24 hours: Temp:  [98.2 F (36.8 C)-98.5 F (36.9 C)] 98.5 F (36.9 C) (04/09 0439) Pulse Rate:  [67-68] 68 (04/09 0439) Resp:  [16-20] 16 (04/09 0439) BP: (108-119)/(46-51) 119/46 mmHg (04/09 0439) SpO2:  [98 %-100 %] 98 % (04/09 0439) Last BM Date: 01/19/16 General:    Elderly AA  female in NAD, smiling Heart:  Regular rate and rhythm; no murmurs Lungs: Respirations even and unlabored, lungs CTA bilaterally Abdomen:  Soft, nontender and nondistended. Normal bowel sounds. Extremities:  Without edema. Neurologic:  Alert and oriented,  grossly normal neurologically. Psych:  Cooperative. Normal mood and affect.  Intake/Output from previous day: 04/08 0701 - 04/09 0700 In: 200 [P.O.:150; IV Piggyback:50] Out: 650 [Urine:650] Intake/Output this shift:    Lab Results:  Recent Labs  01/18/16 0755 01/19/16 0651 01/20/16 0602  WBC 5.2 4.7 4.0  HGB 8.9* 8.6* 8.8*  HCT 27.1* 27.6* 27.4*  PLT 242 239 253   BMET  Recent Labs  01/18/16 0544 01/19/16 0651 01/20/16 0602  NA 141 139 138  K 4.5 4.2 4.0  CL 106 102 101  CO2 23 26 26   GLUCOSE 49* 204* 266*  BUN 39* 38* 43*  CREATININE 1.97* 2.05* 2.35*  CALCIUM 9.0 8.7* 8.9   LFT  Recent Labs  01/20/16 0602  PROT 5.7*  ALBUMIN 2.5*  AST 18  ALT 15  ALKPHOS 157*  BILITOT 0.8   PT/INR No results for input(s): LABPROT, INR in the last 72 hours.    Assessment / Plan:    #1 82 yo female with recurrent GI bleeding and hx of small bowel AVM's- she is s/p ablation of proximal jejunal AVM's on two occasions in the past the last 07/2014 Pt admitted 4/5 after a few days of melena but was also in pulmonary edema She has not had active bleeding the past couple days-  hgb stable Cardiology feels she is euvolemic  Now- and pt feels much better  #2 CHF ef20-25% #3 IDDM #4 CAD s/p MI #5 CKD #6 hx CVA #7 hx afib  Plan;   Will schedule for Enteroscopy with Dr Havery Moros tomorrow, NPO after midnight  Discussed with pt and family  Continue daily hgb  Principal Problem:   GI bleed Active Problems:   DM (diabetes mellitus), type 2, uncontrolled ([place])   Iron deficiency anemia secondary to blood loss (chronic)   Essential hypertension   Chronic diastolic congestive heart failure (HCC)   GERD   CAD (coronary artery disease)   Irregular heart rhythm   NSTEMI (non-ST elevated myocardial infarction) ([place])   Anemia   Acute on chronic combined systolic and diastolic heart failure (HCC)   CKD (chronic kidney disease)   Acute blood loss anemia     LOS: 4 days   Amy Esterwood  01/20/2016, 10:54 AM  I have discussed the case with the PA, and that is the plan I formulated.  CC: melena and ABLA

## 2016-01-21 NOTE — Interval H&P Note (Signed)
History and Physical Interval Note:  01/21/2016 9:59 AM  [name]  has presented today for surgery, with the diagnosis of avm's  The various methods of treatment have been discussed with the patient and family. After consideration of risks, benefits and other options for treatment, the patient has consented to  Procedure(s): ENTEROSCOPY (N/A) as a surgical intervention .  The patient's history has been reviewed, patient examined, no change in status, stable for surgery.  I have reviewed the patient's chart and labs.  Questions were answered to the patient's satisfaction.     Renelda Loma Armbruster

## 2016-01-21 NOTE — Care Management Important Message (Signed)
Important Message  Patient Details  Name: [name] MRN: [id number] Date of Birth: [birth year]   Medicare Important Message Given:  Yes    Nathen May 01/21/2016, 12:57 PM

## 2016-01-21 NOTE — Anesthesia Preprocedure Evaluation (Addendum)
Anesthesia Evaluation  Patient identified by MRN, date of birth, ID band Patient awake    Reviewed: Allergy & Precautions, NPO status , Patient's Chart, lab work & pertinent test results, reviewed documented beta blocker date and time   History of Anesthesia Complications Negative for: history of anesthetic complications  Airway Mallampati: II  TM Distance: >3 FB Neck ROM: Full    Dental  (+) Poor Dentition, Missing, Loose, Chipped, Dental Advisory Given   Pulmonary asthma , sleep apnea , COPD (supplemental O2 at night),  COPD inhaler and oxygen dependent,    breath sounds clear to auscultation       Cardiovascular hypertension, Pt. on medications and Pt. on home beta blockers (-) angina ('11 cath: non-obstructive)+ CAD, + Past MI and +CHF   Rhythm:Regular Rate:Normal  EF 20-25% with dilated LV, mild LVH, severe hypokinesis, and mod-severe reduction in RV function, mild MS with mod MR, severe TR, pulm HTN   Neuro/Psych Walks with walker CVA, Residual Symptoms    GI/Hepatic Neg liver ROS, GERD  Medicated and Poorly Controlled,  Endo/Other  diabetes (glu 246), Insulin Dependent  Renal/GU Renal InsufficiencyRenal disease (creat 2.32, K+ 3.9)     Musculoskeletal   Abdominal   Peds  Hematology  (+) Blood dyscrasia (Hb 9.0), ,   Anesthesia Other Findings   Reproductive/Obstetrics                            Anesthesia Physical Anesthesia Plan  ASA: IV  Anesthesia Plan: MAC   Post-op Pain Management:    Induction: Intravenous  Airway Management Planned: Nasal Cannula  Additional Equipment:   Intra-op Plan:   Post-operative Plan:   Informed Consent: I have reviewed the patients History and Physical, chart, labs and discussed the procedure including the risks, benefits and alternatives for the proposed anesthesia with the patient or authorized representative who has indicated his/her  understanding and acceptance.   Dental advisory given  Plan Discussed with: CRNA and Surgeon  Anesthesia Plan Comments: (Plan routine monitors, MAC)        Anesthesia Quick Evaluation

## 2016-01-21 NOTE — Transfer of Care (Signed)
Immediate Anesthesia Transfer of Care Note  Patient: [name]  Procedure(s) Performed: Procedure(s): ENTEROSCOPY (N/A)  Patient Location: PACU  Anesthesia Type:MAC  Level of Consciousness: awake, alert  and patient cooperative  Airway & Oxygen Therapy: Patient Spontanous Breathing and Patient connected to nasal cannula oxygen  Post-op Assessment: Report given to RN, Post -op Vital signs reviewed and stable and Patient moving all extremities  Post vital signs: Reviewed and stable  Last Vitals:  Filed Vitals:   01/21/16 0932 01/21/16 1038  BP: 144/54 153/68  Pulse: 67 77  Temp: 36.9 C 36.5 C  Resp: 20 26    Complications: No apparent anesthesia complications

## 2016-01-21 NOTE — Consult Note (Signed)
   [facility] Inpatient Consult   01/21/2016  [name] Jun 29, 1933 [id number] Patient was assessed for [facility] for community services. Patient was previously active with [facility] [facility].  Met with patient and daughter [name] at bedside regarding being restarted with [facility] services. Daughter states she was receiving services with nurse follow up and her brother [name] follows up with her needs.  Consent form signed is on file with [place] information given.  Of note, [facility] Care Management services does not replace or interfere with any services that are arranged by inpatient case management or social work. For additional questions or referrals please contact: Natividad Brood, RN BSN [facility] Liaison  [phone] business mobile phone Toll free office [phone]

## 2016-01-21 NOTE — Anesthesia Postprocedure Evaluation (Signed)
Anesthesia Post Note  Patient: [name]  Procedure(s) Performed: Procedure(s) (LRB): ENTEROSCOPY (N/A)  Patient location during evaluation: Endoscopy Anesthesia Type: MAC Level of consciousness: oriented, awake and alert and patient cooperative Pain management: pain level controlled Vital Signs Assessment: post-procedure vital signs reviewed and stable Respiratory status: spontaneous breathing, nonlabored ventilation and respiratory function stable Cardiovascular status: blood pressure returned to baseline and stable Postop Assessment: no signs of nausea or vomiting Anesthetic complications: no    Last Vitals:  Filed Vitals:   01/21/16 1100 01/21/16 1110  BP:    Pulse: 69 68  Temp:    Resp: 20 23    Last Pain:  Filed Vitals:   01/21/16 1116  PainSc: 0-No pain                 Macklen Wilhoite,E. Tiyah Zelenak

## 2016-01-21 NOTE — Progress Notes (Signed)
Physical Therapy Treatment Patient Details Name: KARONDA OREAR MRN: [id number] DOB: July 11, 1933 Today's Date: 01/21/2016    History of Present Illness Pt adm with GI bleed. PMH - CAD, CHF, CVA, GI bleed    PT Comments    Pt with large stool with dark red blood. Pt weaker and fatigued quicker today.   Follow Up Recommendations  Home health PT;Supervision for mobility/OOB     Equipment Recommendations  None recommended by PT    Recommendations for Other Services       Precautions / Restrictions Precautions Precautions: Fall Restrictions Weight Bearing Restrictions: No    Mobility  Bed Mobility Overal bed mobility: Needs Assistance Bed Mobility: Supine to Sit     Supine to sit: Supervision;HOB elevated     General bed mobility comments: Incr time  Transfers Overall transfer level: Needs assistance Equipment used: Rolling walker (2 wheeled) Transfers: Sit to/from Stand Sit to Stand: Min assist;Mod assist         General transfer comment: Assist to bring hips up and for balance. Pt had to perform repeatedly for pericare and required incr assist as she fatigued  Ambulation/Gait Ambulation/Gait assistance: Min assist;+2 safety/equipment Ambulation Distance (Feet): 3 Feet Assistive device: Rolling walker (2 wheeled) Gait Pattern/deviations: Step-through pattern;Decreased step length - right;Decreased step length - left;Shuffle;Trunk flexed Gait velocity: decr Gait velocity interpretation: <1.8 ft/sec, indicative of risk for recurrent falls General Gait Details: Limited to a few steps due to pt with large stool with dark red blood present. Knees "bounce" during ambulation.Assist for balance and support.   Stairs            Wheelchair Mobility    Modified Rankin (Stroke Patients Only)       Balance Overall balance assessment: Needs assistance Sitting-balance support: No upper extremity supported;Feet supported Sitting balance-Leahy Scale: Fair      Standing balance support: Bilateral upper extremity supported Standing balance-Leahy Scale: Poor Standing balance comment: walker and min A for repeated standing for pericare                    Cognition Arousal/Alertness: Awake/alert Behavior During Therapy: WFL for tasks assessed/performed Overall Cognitive Status: Within Functional Limits for tasks assessed                      Exercises      General Comments        Pertinent Vitals/Pain Pain Assessment: No/denies pain    Home Living                      Prior Function            PT Goals (current goals can now be found in the care plan section) Acute Rehab PT Goals Patient Stated Goal: Not stated Progress towards PT goals: Not progressing toward goals - comment (fatigue and bloody stool)    Frequency  Min 3X/week    PT Plan Current plan remains appropriate    Co-evaluation             End of Session Equipment Utilized During Treatment: Gait belt Activity Tolerance: Patient limited by fatigue Patient left: in chair;with call bell/phone within reach;with chair alarm set     Time: [id number] PT Time Calculation (min) (ACUTE ONLY): 29 min  Charges:  $Gait Training: 23-37 mins                    G Codes:  Kile Kabler 01/21/2016, 3:48 PM [facility] PT [phone]

## 2016-01-21 NOTE — Progress Notes (Signed)
Patient ID: [name], female   DOB: 1933/01/31, 82 y.o.   MRN: [id number]  [facility] PROGRESS NOTE  [name] [id number] DOB: 1933/09/27 DOA: 01/16/2016 PCP: [name], MD   Brief narrative:    82 y.o. 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 She was seen at hospital 01/12/16 for evaluation of dark stools for which she was to continue workup as OP. It was noted that hemoglobin was 11.5 at that time however, this admission, hemoglobin was 9.3. GI consulted but deferred scope due to rhonchi on lung exam and TR[facility] consulted cardiology. Now waiting  on optimizing patient from a pulmonary and cardiology standpoint  Assessment/Plan:    Acute blood loss anemia, GI bleed, Angiodysplasia of intestine with suspected hemorrhage  - GI consulted, scoping held initially due to  heart issues, Cardiology following - on PPI already, no active bleeding, hemoglobin stable -scope on 4/10, will f/u gi rec's  Chronic Combined systolic and diastolic congestive heart failure (HCC) - Current medications BB, IMDUR, Demadex Continue Lasix> now changed to Demadex, Zaroxolyn last dose 4/5 - weight trending down, euvolemic on exam, cardiology following  Bronchitis? Does has productive cough, lung exam benign, no sob, no chest pain, on room air On abx,  Robitussin-DM, Dulera for possible bronchitis   ? UTI - UA and urine culture requested as pt with dysuria and malodorous urine - placed on empiric Rocephin on 4/6, changed to oral abx -Continue antibiotics for a total of 5 days -patient denies urinary symptom  Insulin dependent DM (diabetes mellitus), type 2, uncontrolled ([place]) Had Hypoglycemic on 4/7, discontinue Lantus, currently on ssi, check a1c  Essential hypertension - reasonable inpatient controlled  CAD  (coronary artery disease) - asymptomatic   CKD (chronic kidney disease), stage IV - Crea 1.7--1.9, baseline 1.8 - 2.2. - monitor BMP   DVT prophylaxis - SCD's  Code Status: Full.  Family Communication:  plan of care discussed with the patient Disposition Plan: home with  home health pending GI clearance   IV access:  Peripheral IV  Procedures and diagnostic studies:    X-ray Chest Pa And Lateral  01/16/2016  CLINICAL DATA:  History of rectal bleeding, congestive heart failure, diabetes, chronic renal disease EXAM: CHEST  2 VIEW COMPARISON:  Chest x-ray of 01/12/2016 FINDINGS: No pneumonia is seen. There is moderate pulmonary vascular congestion however with moderate cardiomegaly. Tiny pleural effusions cannot be excluded. The thoracic vertebrae are normal alignment. IMPRESSION: Cardiomegaly mild pulmonary vascular congestion. Electronically Signed   By: Ivar Drape M.D.   On: 01/16/2016 17:20   Dg Chest 2 View  01/12/2016  CLINICAL DATA:  Pulmonary edema EXAM: CHEST  2 VIEW COMPARISON:  12/20/2015 FINDINGS: The heart is moderately enlarged. Mild diffuse pulmonary edema. Tiny pleural effusions. No pneumothorax. IMPRESSION: Mild CHF.  Tiny pleural effusions. Electronically Signed   By: Marybelle Killings M.D.   On: 01/12/2016 11:45    Medical Consultants:  GI Cardiology   Other Consultants:  None  IAnti-Infectives:   Ceftriaxone 4/6 -->4/9 Doxycycline from 4/9  Jenean Escandon, MD PhD [facility] Pager [phone]  If 7PM-7AM, please contact night-coverage [email or web address] Password [facility] 01/21/2016, 5:58 PM   LOS: 5 days   HPI/Subjective: Scope today, NAD, very pleasant, mild baseline dementia  Objective: Filed Vitals:   01/21/16 1050 01/21/16 1100  01/21/16 1110 01/21/16 1504  BP:    133/46  Pulse: 71 69 68 77  Temp:    98.6 F (37 C)  TempSrc:    Oral  Resp: 25 20 23 20   Height:      Weight:      SpO2: 99% 98% 94% 95%    Intake/Output Summary (Last 24 hours) at 01/21/16 1758 Last  data filed at 01/21/16 1030  Gross per 24 hour  Intake     50 ml  Output    0.5 ml  Net   49.5 ml    Exam:   General:  Pt is alert, follows commands appropriately, not in acute distress, pleasant, frail elderly  Cardiovascular: Regular rate and rhythm,  no rubs, no gallops  Respiratory: Clear to auscultation bilaterally, rhonchi scattered   Abdomen: Soft, non tender, non distended, bowel sounds present, no guarding  Extremities: pulses DP and PT palpable bilaterally  Neuro: Grossly nonfocal, baseline mild dementia, not oriented to year/month, but oriented to place, person and situation, know she is going to have a procedure done  Data Reviewed: Basic Metabolic Panel:  Recent Labs Lab 01/16/16 2122 01/17/16 0154 01/18/16 0544 01/19/16 0651 01/20/16 0602 01/21/16 0528  NA  --  141 141 139 138 139  K  --  3.8 4.5 4.2 4.0 3.9  CL  --  106 106 102 101 101  CO2  --  26 23 26 26 25   GLUCOSE  --  122* 49* 204* 266* 252*  BUN  --  35* 39* 38* 43* 48*  CREATININE  --  1.71* 1.97* 2.05* 2.35* 2.32*  CALCIUM  --  8.9 9.0 8.7* 8.9 9.1  MG 1.7  --   --   --   --   --   PHOS 2.2*  --   --   --   --   --    Liver Function Tests:  Recent Labs Lab 01/16/16 1245 01/17/16 0154 01/19/16 0651 01/20/16 0602 01/21/16 0528  AST 25 19 18 18 16   ALT 22 18 14 15 14   ALKPHOS 206* 180* 160* 157* 154*  BILITOT 0.7 0.7 0.9 0.8 0.7  PROT 6.7 6.1* 5.7* 5.7* 6.0*  ALBUMIN 3.2* 2.9* 2.5* 2.5* 2.8*   CBC:  Recent Labs Lab 01/15/16 1542  01/17/16 0154  01/17/16 1714 01/18/16 0755 01/19/16 0651 01/20/16 0602 01/21/16 0528  WBC 3.8*  < > 4.4  --   --  5.2 4.7 4.0 4.4  NEUTROABS 2.3  --   --   --   --   --   --   --   --   HGB 9.5*  < > 9.3*  < > 8.5* 8.9* 8.6* 8.8* 9.0*  HCT 29.1*  < > 29.0*  < > 27.3* 27.1* 27.6* 27.4* 27.5*  MCV 84.5  < > 84.3  --   --  84.2 84.9 85.4 85.4  PLT 223.0  < > 282  --   --  242 239 253 260  < > = values in this interval not  displayed. CBG:  Recent Labs Lab 01/20/16 1641 01/20/16 2222 01/21/16 0808 01/21/16 1215 01/21/16 1645  GLUCAP 371* 160* 246* 148* 174*   Scheduled Meds: . amLODipine  5 mg Oral Daily  . aspirin EC  81 mg Oral Daily  . atorvastatin  40 mg Oral q1800  . cefTRIAXone  IV  1 g Intravenous Q24H  . hydrALAZINE  25 mg Oral 3 times per day  .  insulin aspart  0-5 Units Subcutaneous QHS  . insulin aspart  0-9 Units Subcutaneous TID WC  . insulin glargine  7 Units Subcutaneous QHS  . isosorbide mononitrate  60 mg Oral Daily  . magnesium oxide  400 mg Oral BID  . metoprolol succinate  25 mg Oral Daily  . pantoprazole  IV  40 mg Intravenous Q12H  . torsemide  40 mg Oral Daily   Continuous Infusions: . lactated ringers 1,000 mL (01/21/16 0934)

## 2016-01-21 NOTE — Op Note (Signed)
[facility] Patient Name: [name] Procedure Date : 01/21/2016 MRN: [id number] Attending MD: Carlota Raspberry. Armbruster MD, MD Date of Birth: 1933/07/22 CSN: [id number] Age: 82 Admit Type: Inpatient Procedure:                Small bowel enteroscopy Indications:              Melena, history of small bowel AVMs requiring                            ablation Providers:                Carlota Raspberry. Armbruster MD, MD, Carolynn Comment, RN,                            Cletis Athens, Technician, Ralene Bathe, Technician,                            Rebekah Chesterfield, CRNA Referring MD:              Medicines:                Monitored Anesthesia Care Complications:            No immediate complications. Estimated blood loss:                            None. Estimated Blood Loss:     Estimated blood loss: none. Procedure:                Pre-Anesthesia Assessment:                           - Prior to the procedure, a History and Physical                            was performed, and patient medications and                            allergies were reviewed. The patient's tolerance of                            previous anesthesia was also reviewed. The risks                            and benefits of the procedure and the sedation                            options and risks were discussed with the patient.                            All questions were answered, and informed consent                            was obtained. Prior Anticoagulants: The patient has                            taken aspirin. ASA  Grade Assessment: III - A                            patient with severe systemic disease. After                            reviewing the risks and benefits, the patient was                            deemed in satisfactory condition to undergo the                            procedure.                           After obtaining informed consent, the endoscope was                             passed under direct vision. Throughout the                            procedure, the patient's blood pressure, pulse, and                            oxygen saturations were monitored continuously. The                            [id number] scope was introduced through                            the mouth and advanced to the proximal jejunum. The                            small bowel enteroscopy was accomplished without                            difficulty. The patient tolerated the procedure                            well. Scope In: Scope Out: Findings:      Esophagogastric landmarks were identified: the Z-line was found at 40       cm, the gastroesophageal junction was found at 40 cm and the site of       hiatal narrowing was found at 40 cm from the incisors.      The exam of the esophagus was otherwise normal.      A few small patchy mildly erythematous mucosa without bleeding was found       in the gastric antrum without focal ulceration or erosions.      The exam of the stomach was otherwise normal.      There was no evidence of significant pathology in the entire examined       duodenum.      I thought the extent of the exam was the distal duodenum versus proximal       jejunum. There was an angulated turn that could not  be traversed.       Overall, no AVMs or small bowel pathology to account for the patient's       anemia or symptoms. Impression:               - Esophagogastric landmarks identified.                           - Erythematous mucosa in the antrum.                           - Normal examined duodenum / small bowel.                           Overall, no pathology noted on this exam to cause                            the patient's symptoms, which could otherwise be                            due to more distal small bowel AVMs not able to be                            reached on this exam Moderate Sedation:      MAC only, no moderate  sedation Recommendation:           - Return patient to hospital ward for ongoing care.                           - Clear liquid diet.                           - Continue present medications.                           - Will discuss ulility of small bowel capsule                            endoscopy. The patient is a poor candidate for                            balloon enteroscopy although could consider it if                            anemia / bleeding persists, pending capsule study Procedure Code(s):        --- Professional ---                           [phone], Small intestinal endoscopy, enteroscopy                            beyond second portion of duodenum, not including                            ileum; diagnostic, including collection of  specimen(s) by brushing or washing, when performed                            (separate procedure) Diagnosis Code(s):        --- Professional ---                           K31.89, Other diseases of stomach and duodenum                           K92.1, Melena (includes Hematochezia) CPT copyright 2016 [employer]. All rights reserved. The codes documented in this report are preliminary and upon coder review may  be revised to meet current compliance requirements. Remo Lipps P. Armbruster MD, MD 01/21/2016 10:39:01 AM This report has been signed electronically. Number of Addenda: 0

## 2016-01-21 NOTE — Progress Notes (Signed)
Patient Name: [name] Date of Encounter: 01/21/2016  Principal Problem:   GI bleed Active Problems:   DM (diabetes mellitus), type 2, uncontrolled ([place])   Iron deficiency anemia secondary to blood loss (chronic)   Essential hypertension   Chronic diastolic congestive heart failure (HCC)   GERD   CAD (coronary artery disease)   Irregular heart rhythm   NSTEMI (non-ST elevated myocardial infarction) (HCC)   Anemia   Acute on chronic combined systolic and diastolic heart failure (HCC)   CKD (chronic kidney disease)   Acute blood loss anemia   Length of Stay: 5  SUBJECTIVE  Reports she is breathing easier today, no chest pain or SOB. Pt went to upper endoscopy this morning and told no active bleeding was noted.  CURRENT MEDS . sodium chloride   Intravenous Once  . amLODipine  5 mg Oral Daily  . aspirin EC  81 mg Oral Daily  . atorvastatin  40 mg Oral q1800  . dextromethorphan-guaiFENesin  1 tablet Oral BID  . doxycycline  100 mg Oral Q12H  . guaiFENesin-dextromethorphan  5 mL Oral Q6H  . hydrALAZINE  25 mg Oral 3 times per day  . insulin aspart  0-5 Units Subcutaneous QHS  . insulin aspart  0-9 Units Subcutaneous TID WC  . isosorbide mononitrate  60 mg Oral Daily  . magnesium oxide  400 mg Oral BID  . metoprolol succinate  25 mg Oral Daily  . mometasone-formoterol  2 puff Inhalation BID  . pantoprazole (PROTONIX) IV  40 mg Intravenous Q12H  . torsemide  40 mg Oral Daily   OBJECTIVE  Filed Vitals:   01/21/16 1040 01/21/16 1050 01/21/16 1100 01/21/16 1110  BP:      Pulse: 75 71 69 68  Temp:      TempSrc:      Resp: 24 25 20 23   Height:      Weight:      SpO2: 99% 99% 98% 94%    Intake/Output Summary (Last 24 hours) at 01/21/16 1311 Last data filed at 01/21/16 1030  Gross per 24 hour  Intake     50 ml  Output    0.5 ml  Net   49.5 ml   Filed Weights   01/17/16 0439 01/18/16 0700  Weight: 145 lb 4.5 oz (65.9 kg) 143 lb 1.3 oz (64.9 kg)   PHYSICAL  EXAM  General: Pleasant, NAD.  Very thin  Neuro: Alert and oriented X 3. Moves all extremities spontaneously. Psych: Normal affect. HEENT:  Normal  Neck: Supple without bruits or JVD. Lungs:  Resp regular and unlabored, better today, mild expiratory wheeze. Heart: RRR no s3, s4, or murmurs. Abdomen: Soft, non-tender, non-distended, BS + x 4.  Extremities: No clubbing, cyanosis or edema. DP/PT/Radials 2+ and equal bilaterally.  Accessory Clinical Findings  CBC  Recent Labs  01/20/16 0602 01/21/16 0528  WBC 4.0 4.4  HGB 8.8* 9.0*  HCT 27.4* 27.5*  MCV 85.4 85.4  PLT 253 [id number]   Basic Metabolic Panel  Recent Labs  01/20/16 0602 01/21/16 0528  NA 138 139  K 4.0 3.9  CL 101 101  CO2 26 25  GLUCOSE 266* 252*  BUN 43* 48*  CREATININE 2.35* 2.32*  CALCIUM 8.9 9.1   Liver Function Tests  Recent Labs  01/20/16 0602 01/21/16 0528  AST 18 16  ALT 15 14  ALKPHOS 157* 154*  BILITOT 0.8 0.7  PROT 5.7* 6.0*  ALBUMIN 2.5* 2.8*   X-ray Chest  Pa And Lateral  01/16/2016  CLINICAL DATA:  History of rectal bleeding, congestive heart failure, diabetes, chronic renal disease EXAM: CHEST  2 VIEW COMPARISON:  Chest x-ray of 01/12/2016 FINDINGS: No pneumonia is seen. There is moderate pulmonary vascular congestion however with moderate cardiomegaly. Tiny pleural effusions cannot be excluded. The thoracic vertebrae are normal alignment. IMPRESSION: Cardiomegaly mild pulmonary vascular congestion. Electronically Signed   By: Ivar Drape M.D.   On: 01/16/2016 17:20   Dg Chest 2 View  01/12/2016  CLINICAL DATA:  Pulmonary edema EXAM: CHEST  2 VIEW COMPARISON:  12/20/2015 FINDINGS: The heart is moderately enlarged. Mild diffuse pulmonary edema. Tiny pleural effusions. No pneumothorax. IMPRESSION: Mild CHF.  Tiny pleural effusions. Electronically Signed   By: Marybelle Killings M.D.   On: 01/12/2016 11:45   Tele:  NSR    ASSESSMENT AND PLAN  1. Chronic Combined systolic and diastolic  congestive heart failure (HCC) - Current medications BB, IMDUR, Demadex - Pt appears to be euvolemic at present, pt is stable after having upper endoscopy this   morning  - Continue current meds   -Cardiology will sign off on patient at this time  2. DM (diabetes mellitus), type 2, uncontrolled ([place]) -Currently in Long and Short Acting Insulin -Cbgs controlled  3. Essential hypertension -Controlled on current medications  4. CAD (coronary artery disease)   5. CKD (chronic kidney disease) -Cr is slightly better , still above baseline   6. Bronchitis   -  Wheezing is much better    - Taking scheduled Robitussin DM    Signed, Reino Bellis, NP-C 01/21/2016 1:11 PM       I have examined the patient and reviewed assessment and plan and discussed with patient.  Agree with above as stated.  Appears frail but does not appear to be in heart failure.  D/c metolazone as based on the prn parameter, it could be given daily.  This would worsen the acute on chronic renal failure. Per family, considering capsule study to detect bleeding source.  Continue torsemide.  Siddalee Vanderheiden S.

## 2016-01-22 DIAGNOSIS — D500 Iron deficiency anemia secondary to blood loss (chronic): Secondary | ICD-10-CM

## 2016-01-22 LAB — BASIC METABOLIC PANEL
ANION GAP: 14 (ref 5–15)
BUN: 45 mg/dL — ABNORMAL HIGH (ref 6–20)
CHLORIDE: 104 mmol/L (ref 101–111)
CO2: 23 mmol/L (ref 22–32)
Calcium: 9.3 mg/dL (ref 8.9–10.3)
Creatinine, Ser: 2.06 mg/dL — ABNORMAL HIGH (ref 0.44–1.00)
GFR calc non Af Amer: 22 mL/min — ABNORMAL LOW (ref >60)
GFR, EST AFRICAN AMERICAN: 25 mL/min — AB (ref >60)
GLUCOSE: 223 mg/dL — AB (ref 65–99)
POTASSIUM: 3.5 mmol/L (ref 3.5–5.1)
Sodium: 141 mmol/L (ref 135–145)

## 2016-01-22 LAB — CBC
HEMATOCRIT: 29.4 % — AB (ref 36.0–46.0)
HEMOGLOBIN: 9.8 g/dL — AB (ref 12.0–15.0)
MCH: 28.4 pg (ref 26.0–34.0)
MCHC: 33.3 g/dL (ref 30.0–36.0)
MCV: 85.2 fL (ref 78.0–100.0)
Platelets: 264 10*3/uL (ref 150–400)
RBC: 3.45 MIL/uL — ABNORMAL LOW (ref 3.87–5.11)
RDW: 17.2 % — AB (ref 11.5–15.5)
WBC: 13.7 10*3/uL — AB (ref 4.0–10.5)

## 2016-01-22 LAB — GLUCOSE, CAPILLARY
GLUCOSE-CAPILLARY: 275 mg/dL — AB (ref 65–99)
Glucose-Capillary: 196 mg/dL — ABNORMAL HIGH (ref 65–99)

## 2016-01-22 LAB — HEMOGLOBIN A1C
HEMOGLOBIN A1C: 9.2 % — AB (ref 4.8–5.6)
Mean Plasma Glucose: 217 mg/dL

## 2016-01-22 MED ORDER — POLYSACCHARIDE IRON COMPLEX 150 MG PO CAPS: 150.0000 mg | ORAL_CAPSULE | Freq: Every day | ORAL | Status: DC

## 2016-01-22 MED ORDER — POTASSIUM CHLORIDE CRYS ER 20 MEQ PO TBCR
40.0000 meq | EXTENDED_RELEASE_TABLET | Freq: Once | ORAL | Status: AC
  Administered 2016-01-22: 40 meq via ORAL
  Filled 2016-01-22: qty 2

## 2016-01-22 MED ORDER — GUAIFENESIN ER 600 MG PO TB12: 600.0000 mg | ORAL_TABLET | Freq: Two times a day (BID) | ORAL | Status: AC

## 2016-01-22 MED ORDER — POLYSACCHARIDE IRON COMPLEX 150 MG PO CAPS: 150.0000 mg | ORAL_CAPSULE | ORAL | Status: DC

## 2016-01-22 MED ORDER — DOXYCYCLINE HYCLATE 100 MG PO CAPS: 100.0000 mg | ORAL_CAPSULE | Freq: Two times a day (BID) | ORAL | Status: DC

## 2016-01-22 MED ORDER — METOLAZONE 2.5 MG PO TABS: ORAL_TABLET | ORAL | Status: AC

## 2016-01-22 NOTE — Progress Notes (Signed)
Daily Rounding Note  01/22/2016, 10:25 AM  LOS: 6 days   SUBJECTIVE:       No changes.  Breathing at baseline.  Pt says she saw blood with BM yesterday.  No BM or blood today  OBJECTIVE:         Vital signs in last 24 hours:    Temp:  [97.7 F (36.5 C)-99.4 F (37.4 C)] 98.7 F (37.1 C) (04/11 0423) Pulse Rate:  [68-84] 70 (04/11 0821) Resp:  [16-26] 16 (04/11 0423) BP: (122-153)/(44-68) 134/53 mmHg (04/11 0821) SpO2:  [94 %-99 %] 98 % (04/11 0849) Last BM Date: 01/21/16 Filed Weights   01/17/16 0439 01/18/16 0700  Weight: 65.9 kg (145 lb 4.5 oz) 64.9 kg (143 lb 1.3 oz)   Looks tired and frail: her baseline Productive cough, no resting dyspnea RRR Abdomen soft, active BS  Intake/Output from previous day: 04/10 0701 - 04/11 0700 In: 530 [P.O.:480; I.V.:50] Out: 300.5 [Urine:300; Blood:0.5]  Intake/Output this shift:    Lab Results:  Recent Labs  01/20/16 0602 01/21/16 0528 01/22/16 0515  WBC 4.0 4.4 13.7*  HGB 8.8* 9.0* 9.8*  HCT 27.4* 27.5* 29.4*  PLT 253 260 264   BMET  Recent Labs  01/20/16 0602 01/21/16 0528 01/22/16 0515  NA 138 139 141  K 4.0 3.9 3.5  CL 101 101 104  CO2 26 25 23   GLUCOSE 266* 252* 223*  BUN 43* 48* 45*  CREATININE 2.35* 2.32* 2.06*  CALCIUM 8.9 9.1 9.3   LFT  Recent Labs  01/20/16 0602 01/21/16 0528  PROT 5.7* 6.0*  ALBUMIN 2.5* 2.8*  AST 18 16  ALT 15 14  ALKPHOS 157* 154*  BILITOT 0.8 0.7   PT/INR No results for input(s): LABPROT, INR in the last 72 hours. Hepatitis Panel No results for input(s): HEPBSAG, HCVAB, HEPAIGM, HEPBIGM in the last 72 hours.  Studies/Results: No results found.   Scheduled Meds: . sodium chloride   Intravenous Once  . amLODipine  5 mg Oral Daily  . aspirin EC  81 mg Oral Daily  . atorvastatin  40 mg Oral q1800  . dextromethorphan-guaiFENesin  1 tablet Oral BID  . doxycycline  100 mg Oral Q12H  .  guaiFENesin-dextromethorphan  5 mL Oral Q6H  . hydrALAZINE  25 mg Oral 3 times per day  . insulin aspart  0-5 Units Subcutaneous QHS  . insulin aspart  0-9 Units Subcutaneous TID WC  . isosorbide mononitrate  60 mg Oral Daily  . magnesium oxide  400 mg Oral BID  . metoprolol succinate  25 mg Oral Daily  . mometasone-formoterol  2 puff Inhalation BID  . pantoprazole (PROTONIX) IV  40 mg Intravenous Q12H  . potassium chloride  40 mEq Oral Once  . torsemide  40 mg Oral Daily   Continuous Infusions: . lactated ringers 10 mL/hr at 01/22/16 0327   PRN Meds:.acetaminophen **OR** acetaminophen, albuterol, ondansetron **OR** ondansetron (ZOFRAN) IV, oxyCODONE, polyethylene glycol   ASSESMENT:   *  Recurrent GI bleeding and anemia.   4/10 EGD: antral erythema.  No AVMs  Suspect more distal SB AVMs are source  Hgb stable, improved. Did not get PRBCs.    PLAN   *  Pt does not want capsule endo at this time.  She may consider this in future.    *  Will need follow up CBCs periodically via lab at [facility]: [facility].  Refer back to Dr Carlean Purl if recurrent  GI  Bleeding.  Mgt strategy may be that of prn transfusions for recurrent anemia.  She may not be a candidate for dbl ballooon enteroscopy if we did find distal SB AVMs.   *  Once daily po iron. Once daily PPI, on Omeprazole PTA   *  Ok to discharge.     Azucena Freed  01/22/2016, 10:25 AM Pager: [phone]

## 2016-01-22 NOTE — Progress Notes (Signed)
[name] to be D/C'd to home per MD order.  Discussed with the patient and all questions fully answered.  VSS, Skin clean, dry and intact without evidence of skin break down, no evidence of skin tears noted. IV catheter discontinued intact. Site without signs and symptoms of complications. Dressing and pressure applied.  An After Visit Summary was printed and given to the patient. Prescriptions sent to patient's pharmacy.  D/c education completed with patient/family including follow up instructions, medication list, d/c activities limitations if indicated, with other d/c instructions as indicated by MD - patient able to verbalize understanding, all questions fully answered.   Patient instructed to return to ED, call 911, or call MD for any changes in condition.   Patient escorted via [place], and D/C home via private auto.  Morley Kos Price 01/22/2016 1:04 PM

## 2016-01-22 NOTE — Care Management Note (Signed)
Case Management Note  Patient Details  Name: LASEAN SALCIDO MRN: [id number] Date of Birth: November 28, 1932  Subjective/Objective:   Admitted with GI bleed. PCP: Iona Beard.       Action/Plan: Plan is to d/c to home today with son with the resumption of home health services (RN,PT).  Expected Discharge Date:     01/22/2016           Expected Discharge Plan:  [place]  In-House Referral:     Discharge planning Services  CM Consult  Post Acute Care Choice:  Resumption of Svcs/PTA Provider Choice offered to:  Patient  DME Arranged:    DME Agency:     HH Arranged:  RN, PT [facility] Agency:   [facility]  Status of Service:  Completed, signed off   Sharin Mons, [place] [id number] 01/22/2016, 11:50 AM

## 2016-01-22 NOTE — Discharge Summary (Signed)
Discharge Summary  [name] [id number] DOB: 03/20/1933  PCP: Cheryl Font, MD  Admit date: 01/16/2016 Discharge date: 01/22/2016  Time spent: >76mins  Recommendations for Outpatient Follow-up:  1. F/u with PMD within a week  for hospital discharge follow up, repeat cbc/bmp at follow up, pmd to close follow up cbc, refer to have outpatient blood transfusion prn, refer back to GI Dr [name] if she decided to consider further GI work up 2. F/u with cardiology as scheduled  Discharge Diagnoses:  Active Hospital Problems   Diagnosis Date Noted  . GI bleed 05/10/2014  . Insulin dependent diabetes mellitus ([place])   . Acute blood loss anemia   . CKD (chronic kidney disease)   . Acute on chronic combined systolic and diastolic heart failure ([place]) 08/19/2014  . Anemia 05/10/2014  . NSTEMI (non-ST elevated myocardial infarction) ([place]) 02/04/2014  . Irregular heart rhythm 02/26/2012  . CAD (coronary artery disease)   . Iron deficiency anemia secondary to blood loss (chronic) 01/06/2008  . Essential hypertension 08/03/2006  . Chronic diastolic congestive heart failure ([place]) 08/03/2006  . GERD 08/03/2006  . DM (diabetes mellitus), type 2, uncontrolled ([place]) 11/04/1983    Resolved Hospital Problems   Diagnosis Date Noted Date Resolved  No resolved problems to display.    Discharge Condition: stable  Diet recommendation: heart healthy/carb modified  Filed Weights   01/17/16 0439 01/18/16 0700  Weight: 65.9 kg (145 lb 4.5 oz) 64.9 kg (143 lb 1.3 oz)    History of present illness:  [name] is a 82 y.o. 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 She was seen at hospital 01/12/16 for evaluation of dark stools for which she was to continue workup as OP. It was noted that there hemoglobin was 11.5 at the  time however, today, a new hemoglobin was reported to be 9.5. Because of that Dr. Carlean Hurley felt prudent at the patient be admitted for further evaluation and workup, which would include upper endoscopy on 01/17/2016. Per daughter's report, she continues to have maroon stools, which is suspicious for recurrent AVMs. She denies anynausea or vomiting. She denies any abdominal pain.She denies any shortness of breath, but she does have intermittent cough, which versus report, has been present for about 2 weeks, following a palpation cold".she is asymptomatic for anemia, feeling very weak. At the ED, her vital signs are stable, and she is afebrile. Chest x-ray is in progress. Creatinine 1.85. Rest of the CMET and CBC is unremarkable.  Hospital Course:  Principal Problem:   GI bleed Active Problems:   DM (diabetes mellitus), type 2, uncontrolled (HCC)   Iron deficiency anemia secondary to blood loss (chronic)   Essential hypertension   Chronic diastolic congestive heart failure (HCC)   GERD   CAD (coronary artery disease)   Irregular heart rhythm   NSTEMI (non-ST elevated myocardial infarction) (HCC)   Anemia   Acute on chronic combined systolic and diastolic heart failure (HCC)   CKD (chronic kidney disease)   Acute blood loss anemia   Insulin dependent diabetes mellitus ([place])  82 y.o. 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 She  was seen at hospital 01/12/16 for evaluation of dark stools for which she was to continue workup as OP. It was noted that hemoglobin was 11.5 at that time however, this admission, hemoglobin was 9.3. GI consulted but deferred scope due to rhonchi on lung exam and TRH consulted cardiology. optimizing patient from a pulmonary and cardiology standpoint, now better, had eondoscope  Assessment/Plan:     Acute blood loss anemia, GI bleed, Angiodysplasia of intestine with suspected hemorrhage  - GI consulted, scoping held initially due to heart issues, Cardiology consulted - on PPI already, no active bleeding, hemoglobin stable -cardiac pulmonary condition improved, scope on 4/10, no acute findings, GI recommended capsule study which patient and family declined, she is discharged home with close follow up with pmd to monitor hgb, transfuse prn, per GI should patient changed her mind and want to have further gi work up, patient can be referred back to gi by pmd.  Chronic Combined systolic and diastolic congestive heart failure ([place]) - Current medications BB, IMDUR, Demadex Continue Lasix> now changed to Demadex, Zaroxolyn last dose 4/5 - weight trending down, euvolemic on exam, cardiology input appreciated, continue outpatient cardiology follow up  Bronchitis? Does has productive cough, lung exam benign, no sob, no chest pain, on room air. cxr non infiltrate On abx, Robitussin-DM, Dulera for possible bronchitis, better    ? UTI - UA and urine culture requested as pt with dysuria and malodorous urine - placed on empiric Rocephin on 4/6, changed to oral abx -Continue antibiotics for a total of 5 days -patient denies urinary symptom  Insulin dependent DM (diabetes mellitus), type 2, uncontrolled ([place]) Had Hypoglycemic on 4/7, discontinue Lantus initially, currently on ssi,  a1c 9.2 Now resumed diet, discharged on home dose insulin regimen and close follow up with pmd  Essential hypertension - reasonable inpatient controlled  CAD (coronary artery disease) - asymptomatic   CKD (chronic kidney disease), stage IV - Crea 1.7--1.9, baseline 1.8 - 2.2. - monitor BMP   DVT prophylaxis - SCD's  Code Status: DNR , confirmed with patient's daughter [name] over the phone on 4/11 Family Communication: plan of care discussed with the patient and her daughter [name] over the  phone Disposition Plan: home with home health on 4/11   IV access:  Peripheral IV  Procedures and diagnostic studies:    Imaging Results    X-ray Chest Pa And Lateral  01/16/2016 CLINICAL DATA: History of rectal bleeding, congestive heart failure, diabetes, chronic renal disease EXAM: CHEST 2 VIEW COMPARISON: Chest x-ray of 01/12/2016 FINDINGS: No pneumonia is seen. There is moderate pulmonary vascular congestion however with moderate cardiomegaly. Tiny pleural effusions cannot be excluded. The thoracic vertebrae are normal alignment. IMPRESSION: Cardiomegaly mild pulmonary vascular congestion. Electronically Signed By: Ivar Drape M.D. On: 01/16/2016 17:20   Dg Chest 2 View  01/12/2016 CLINICAL DATA: Pulmonary edema EXAM: CHEST 2 VIEW COMPARISON: 12/20/2015 FINDINGS: The heart is moderately enlarged. Mild diffuse pulmonary edema. Tiny pleural effusions. No pneumothorax. IMPRESSION: Mild CHF. Tiny pleural effusions. Electronically Signed By: Marybelle Killings M.D. On: 01/12/2016 11:45     Medical Consultants:  GI Cardiology   Other Consultants:  None  IAnti-Infectives:   Ceftriaxone 4/6 -->4/9 Doxycycline from 4/9       Discharge Exam: BP 134/53 mmHg  Pulse 70  Temp(Src) 98.7 F (37.1 C) (Oral)  Resp 16  Ht 5\' 2"  (1.575 m)  Wt 64.9 kg (143 lb 1.3 oz)  BMI 26.16 kg/m2  SpO2 98%  LMP 12/19/1968  General: Pt is alert, follows commands appropriately, not in acute distress, pleasant, frail elderly  Cardiovascular: Regular rate and rhythm, no rubs, no gallops  Respiratory: Clear to auscultation bilaterally, +rhonchi , but able clear after cough  Abdomen: Soft, non tender, non distended, bowel sounds present, no guarding  Extremities: pulses DP and PT palpable bilaterally  Neuro: Grossly nonfocal, baseline mild dementia, not oriented to year/month, but oriented to place, person and situation, know she is going to have a procedure done,  possible baseline   Discharge Instructions You were cared for by a hospitalist during your hospital stay. If you have any questions about your discharge medications or the care you received while you were in the hospital after you are discharged, you can call the unit and asked to speak with the hospitalist on call if the hospitalist that took care of you is not available. Once you are discharged, your primary care physician will handle any further medical issues. Please note that NO REFILLS for any discharge medications will be authorized once you are discharged, as it is imperative that you return to your primary care physician (or establish a relationship with a primary care physician if you do not have one) for your aftercare needs so that they can reassess your need for medications and monitor your lab values.      Discharge Instructions    AMB Referral to [facility]    Complete by:  As directed   Reason for consult:  Post hospital follow up - recently active  Expected date of contact:  1-3 days (reserved for hospital discharges)     Diet - low sodium heart healthy    Complete by:  As directed   Carb modified     Face-to-face encounter (required for Medicare/Medicaid patients)    Complete by:  As directed   I Karen Kinnard certify that this patient is under my care and that I, or a nurse practitioner or physician's assistant working with me, had a face-to-face encounter that meets the physician face-to-face encounter requirements with this patient on 01/21/2016. The encounter with the patient was in whole, or in part for the following medical condition(s) which is the primary reason for home health care (List medical condition): FTT  The encounter with the patient was in whole, or in part, for the following medical condition, which is the primary reason for home health care:  FTT  I certify that, based on my findings, the following services are medically necessary home health services:   Physical therapy  Reason for Medically Necessary Home Health Services:  Skilled Nursing- Change/Decline in Patient Status  My clinical findings support the need for the above services:  Unsafe ambulation due to balance issues  Further, I certify that my clinical findings support that this patient is homebound due to:  Unable to leave home safely without assistance     Home Health    Complete by:  As directed   To provide the following care/treatments:  PT     Increase activity slowly    Complete by:  As directed             Medication List    TAKE these medications        acetaminophen 500 MG tablet  Commonly known as:  TYLENOL  Take 500 mg by mouth every 6 (six) hours as needed for moderate pain or headache.     albuterol 108 (90 Base) MCG/ACT inhaler  Commonly known as:  PROAIR HFA  Inhale 2 puffs into the lungs every 6 (six) hours as needed for wheezing.     ALLERGY RELIEF 10 MG tablet  Generic drug:  loratadine  Take 10 mg by mouth daily. Reported on 12/07/2015     amLODipine 5 MG tablet  Commonly known as:  NORVASC  Take 1 tablet by mouth daily.     aspirin 81 MG tablet  Take 81 mg by mouth daily.     atorvastatin 40 MG tablet  Commonly known as:  LIPITOR  TAKE 1 TABLET (40 MG TOTAL) BY MOUTH DAILY AT 6 PM.     doxycycline 100 MG capsule  Commonly known as:  VIBRAMYCIN  Take 1 capsule (100 mg total) by mouth 2 (two) times daily.     guaiFENesin 600 MG 12 hr tablet  Commonly known as:  MUCINEX  Take 1 tablet (600 mg total) by mouth 2 (two) times daily.     hydrALAZINE 25 MG tablet  Commonly known as:  APRESOLINE  Take 1 tablet (25 mg total) by mouth every 8 (eight) hours.     insulin glargine 100 UNIT/ML injection  Commonly known as:  LANTUS  Inject 0.07 mLs (7 Units total) into the skin at bedtime.     iron polysaccharides 150 MG capsule  Commonly known as:  NIFEREX  Take 1 capsule (150 mg total) by mouth every other day.     isosorbide mononitrate 60  MG 24 hr tablet  Commonly known as:  IMDUR  Take 1 tablet (60 mg total) by mouth daily.     magnesium oxide 400 (241.3 Mg) MG tablet  Commonly known as:  MAG-OX  Take 1 tablet (400 mg total) by mouth 2 (two) times daily.     metolazone 2.5 MG tablet  Commonly known as:  ZAROXOLYN  1 TABLET AS NEEDED WHEN WEIGHT IS OVER 152 LBS     metoprolol succinate 25 MG 24 hr tablet  Commonly known as:  TOPROL-XL  Take 1 tablet by mouth daily.     NOVOLOG FLEXPEN 100 UNIT/ML FlexPen  Generic drug:  insulin aspart  Inject 2-5 Units into the skin 3 (three) times daily with meals.     omeprazole 20 MG capsule  Commonly known as:  PRILOSEC  Take 1 capsule (20 mg total) by mouth 2 (two) times daily before a meal.     polyethylene glycol packet  Commonly known as:  MIRALAX / GLYCOLAX  Take 17 g by mouth daily as needed for mild constipation.     potassium chloride SA 20 MEQ tablet  Commonly known as:  KLOR-CON M20  TAKE 1 TABLET DAILY AND TAKE AN EXTRA TABLET WHEN YOU HAVE TO TAKE AN EXTRA METOLAZONE FOR WT OVER 152 LBS     torsemide 20 MG tablet  Commonly known as:  DEMADEX  Take 2 tablets (40 mg total) by mouth daily.       Allergies  Allergen Reactions  . Baking Soda-Fluoride [Sodium Fluoride] Nausea And Vomiting  . Magnesium Hydroxide Nausea And Vomiting   Follow-up Information    Follow up with Quay Burow, MD On 02/19/2016.   Specialties:  Cardiology, Radiology   Why:  10:15am cardiology follow up   Contact information:   [address] [place] [address] [phone]       Follow up with Silvano Rusk, MD.   Specialty:  Gastroenterology   Why:  As needed   Contact information:   [place] [place] [address]  [phone]        The results of significant diagnostics from this hospitalization (including imaging, microbiology, ancillary and laboratory) are listed below for reference.    Significant Diagnostic Studies: X-ray Chest Pa And  Lateral  01/16/2016  CLINICAL DATA:  History of rectal bleeding, congestive heart failure, diabetes, chronic renal disease EXAM: CHEST  2 VIEW COMPARISON:  Chest x-ray of 01/12/2016 FINDINGS: No pneumonia is seen. There is moderate pulmonary vascular congestion however with moderate cardiomegaly. Tiny pleural effusions cannot be excluded. The thoracic vertebrae are normal alignment. IMPRESSION: Cardiomegaly mild pulmonary vascular congestion. Electronically Signed   By: Ivar Drape M.D.   On: 01/16/2016 17:20   Dg Chest 2 View  01/12/2016  CLINICAL DATA:  Pulmonary edema EXAM: CHEST  2 VIEW COMPARISON:  12/20/2015 FINDINGS: The heart is moderately enlarged. Mild diffuse pulmonary edema. Tiny pleural effusions. No pneumothorax. IMPRESSION: Mild CHF.  Tiny pleural effusions. Electronically Signed   By: Marybelle Killings M.D.   On: 01/12/2016 11:45    Microbiology: No results found for this or any previous visit (from the past 240 hour(s)).   Labs: Basic Metabolic Panel:  Recent Labs Lab 01/16/16 2122  01/18/16 0544 01/19/16 0651 01/20/16 0602 01/21/16 0528 01/22/16 0515  NA  --   < > 141 139 138 139 141  K  --   < > 4.5 4.2 4.0 3.9 3.5  CL  --   < > 106 102 101 101 104  CO2  --   < > 23 26 26 25 23   GLUCOSE  --   < > 49* 204* 266* 252* 223*  BUN  --   < > 39* 38* 43* 48* 45*  CREATININE  --   < > 1.97* 2.05* 2.35* 2.32* 2.06*  CALCIUM  --   < > 9.0 8.7* 8.9 9.1 9.3  MG 1.7  --   --   --   --   --   --   PHOS 2.2*  --   --   --   --   --   --   < > = values in this interval not displayed. Liver Function Tests:  Recent Labs Lab 01/16/16 1245 01/17/16 0154 01/19/16 0651 01/20/16 0602 01/21/16 0528  AST 25 19 18 18 16   ALT 22 18 14 15 14   ALKPHOS 206* 180* 160* 157* 154*  BILITOT 0.7 0.7 0.9 0.8 0.7  PROT 6.7 6.1* 5.7* 5.7* 6.0*  ALBUMIN 3.2* 2.9* 2.5* 2.5* 2.8*   No results for input(s): LIPASE, AMYLASE in the last 168 hours. No results for input(s): AMMONIA in the last 168  hours. CBC:  Recent Labs Lab 01/15/16 1542  01/18/16 0755 01/19/16 0651 01/20/16 0602 01/21/16 0528 01/22/16 0515  WBC 3.8*  < > 5.2 4.7 4.0 4.4 13.7*  NEUTROABS 2.3  --   --   --   --   --   --   HGB 9.5*  < > 8.9* 8.6* 8.8* 9.0* 9.8*  HCT 29.1*  < > 27.1* 27.6* 27.4* 27.5* 29.4*  MCV 84.5  < > 84.2 84.9 85.4 85.4 85.2  PLT 223.0  < > 242 239 253 260 264  < > = values in this interval not displayed. Cardiac Enzymes: No results for input(s): CKTOTAL, CKMB, CKMBINDEX, TROPONINI in the last 168 hours. BNP: BNP (last 3 results)  Recent Labs  11/04/15 1350 12/20/15 1736 01/18/16 0755  BNP 1841.7* 1957.6* 1028.7*    ProBNP (last 3 results) No results  for input(s): PROBNP in the last 8760 hours.  CBG:  Recent Labs Lab 01/21/16 0808 01/21/16 1215 01/21/16 1645 01/21/16 2211 01/22/16 0748  GLUCAP 246* 148* 174* 195* 196*       Signed:  Daviana Haymaker MD, PhD  [facility] 01/22/2016, 11:04 AM

## 2016-01-23 ENCOUNTER — Encounter (HOSPITAL_COMMUNITY): Payer: Self-pay | Admitting: Gastroenterology

## 2016-01-23 ENCOUNTER — Other Ambulatory Visit: Payer: Self-pay | Admitting: *Deleted

## 2016-01-23 NOTE — Patient Outreach (Signed)
Referral received from hospital liaison to begin transition of care program once discharged.  Member previously involved with [facility] after hospitalization in January, son denied need for ongoing services.  He is now open to involvement again.  Member hospitalized for GI bleed on 4/5, discharged 4/11.    Call placed to caregiver/son, [name], to begin transition of care program.  He state the member has been "better."  He denies any bleeding since being discharged.  He states that she still remains a little weak, but is getting better, stating "this happens a couple times a year.  We've been through it before."  He reports that [facility] is involved again with home health nursing, and that she still receives assistance from a home care aide.  He states that he continue to monitor her weight, blood sugar, and blood pressure on a daily basis and voice no concerns.    He denies scheduling a follow up appointment with the member's PCP as of yet, but state he will call to schedule immediately.  Notified that the member has a cardiology appointment on 5/9 and an appointment with Dr. Carlean Purl on 5/31.  Instructed son to contact them to inquire about an earlier appointment as a hospital follow up.  He states that he does not recall setting the appointment and that she already had a colonoscopy and endoscopy while hospitalized.  Encouraged again to contact the office to notify them of member's discharge date and inquire about keeping set appointment versus scheduling one sooner.  He verbalizes understanding.  Will continue transition of care calls next week.  Home visit scheduled within the next 2 weeks.  Valente David, BSN, [place] [facility]  [facility] Care Manager [phone]

## 2016-01-26 ENCOUNTER — Encounter: Payer: Self-pay | Admitting: *Deleted

## 2016-01-31 ENCOUNTER — Other Ambulatory Visit: Payer: Self-pay | Admitting: *Deleted

## 2016-01-31 NOTE — Patient Outreach (Signed)
Weekly transition of care call placed to member's son.  He reports that the member is "doing good."  He state that there has not been any further bleeding, and her weights and blood sugars have been under "control."  According to son, her blood sugars have ranged from 110s-250 and her weight has ranged from 139-140 pounds.  He state she continues to have some shortness of breath with activity, but "none more than usual."  He states that she has had a follow up appointment with her PCP, and he expressed no concerns.  Son denies any urgent needs at this time, state [facility] home health remains involved.  Home visit confirmed for next week.  Valente David, BSN, [place] [facility]  [facility] Care Manager [phone]

## 2016-02-02 ENCOUNTER — Inpatient Hospital Stay (HOSPITAL_COMMUNITY): Payer: Commercial Managed Care - HMO

## 2016-02-02 ENCOUNTER — Encounter (HOSPITAL_COMMUNITY): Payer: Self-pay | Admitting: *Deleted

## 2016-02-02 ENCOUNTER — Inpatient Hospital Stay (HOSPITAL_COMMUNITY)
Admission: EM | Admit: 2016-02-02 | Discharge: 2016-02-03 | DRG: 300 | Disposition: A | Discharge status: Home / Self Care | Payer: Commercial Managed Care - HMO | Attending: Internal Medicine | Admitting: Internal Medicine

## 2016-02-02 DIAGNOSIS — Z9841 Cataract extraction status, right eye: Secondary | ICD-10-CM

## 2016-02-02 DIAGNOSIS — D62 Acute posthemorrhagic anemia: Secondary | ICD-10-CM | POA: Diagnosis present

## 2016-02-02 DIAGNOSIS — K922 Gastrointestinal hemorrhage, unspecified: Secondary | ICD-10-CM

## 2016-02-02 DIAGNOSIS — Z79899 Other long term (current) drug therapy: Secondary | ICD-10-CM

## 2016-02-02 DIAGNOSIS — Z66 Do not resuscitate: Secondary | ICD-10-CM | POA: Diagnosis present

## 2016-02-02 DIAGNOSIS — I429 Cardiomyopathy, unspecified: Secondary | ICD-10-CM | POA: Diagnosis present

## 2016-02-02 DIAGNOSIS — K921 Melena: Secondary | ICD-10-CM | POA: Diagnosis not present

## 2016-02-02 DIAGNOSIS — Z9842 Cataract extraction status, left eye: Secondary | ICD-10-CM

## 2016-02-02 DIAGNOSIS — N184 Chronic kidney disease, stage 4 (severe): Secondary | ICD-10-CM | POA: Diagnosis present

## 2016-02-02 DIAGNOSIS — K2961 Other gastritis with bleeding: Secondary | ICD-10-CM

## 2016-02-02 DIAGNOSIS — N179 Acute kidney failure, unspecified: Secondary | ICD-10-CM | POA: Diagnosis present

## 2016-02-02 DIAGNOSIS — Q2733 Arteriovenous malformation of digestive system vessel: Secondary | ICD-10-CM | POA: Diagnosis not present

## 2016-02-02 DIAGNOSIS — Z7982 Long term (current) use of aspirin: Secondary | ICD-10-CM | POA: Diagnosis not present

## 2016-02-02 DIAGNOSIS — Z8542 Personal history of malignant neoplasm of other parts of uterus: Secondary | ICD-10-CM

## 2016-02-02 DIAGNOSIS — I272 Other secondary pulmonary hypertension: Secondary | ICD-10-CM | POA: Diagnosis present

## 2016-02-02 DIAGNOSIS — E1122 Type 2 diabetes mellitus with diabetic chronic kidney disease: Secondary | ICD-10-CM | POA: Diagnosis present

## 2016-02-02 DIAGNOSIS — K219 Gastro-esophageal reflux disease without esophagitis: Secondary | ICD-10-CM | POA: Diagnosis present

## 2016-02-02 DIAGNOSIS — I2510 Atherosclerotic heart disease of native coronary artery without angina pectoris: Secondary | ICD-10-CM | POA: Diagnosis present

## 2016-02-02 DIAGNOSIS — E1165 Type 2 diabetes mellitus with hyperglycemia: Secondary | ICD-10-CM | POA: Diagnosis present

## 2016-02-02 DIAGNOSIS — I5022 Chronic systolic (congestive) heart failure: Secondary | ICD-10-CM | POA: Diagnosis present

## 2016-02-02 DIAGNOSIS — Z8673 Personal history of transient ischemic attack (TIA), and cerebral infarction without residual deficits: Secondary | ICD-10-CM

## 2016-02-02 DIAGNOSIS — IMO0001 Reserved for inherently not codable concepts without codable children: Secondary | ICD-10-CM

## 2016-02-02 DIAGNOSIS — I130 Hypertensive heart and chronic kidney disease with heart failure and stage 1 through stage 4 chronic kidney disease, or unspecified chronic kidney disease: Secondary | ICD-10-CM | POA: Diagnosis present

## 2016-02-02 DIAGNOSIS — D500 Iron deficiency anemia secondary to blood loss (chronic): Secondary | ICD-10-CM | POA: Diagnosis present

## 2016-02-02 DIAGNOSIS — E1140 Type 2 diabetes mellitus with diabetic neuropathy, unspecified: Secondary | ICD-10-CM | POA: Diagnosis present

## 2016-02-02 DIAGNOSIS — E119 Type 2 diabetes mellitus without complications: Secondary | ICD-10-CM | POA: Diagnosis not present

## 2016-02-02 DIAGNOSIS — J45909 Unspecified asthma, uncomplicated: Secondary | ICD-10-CM | POA: Diagnosis present

## 2016-02-02 DIAGNOSIS — E860 Dehydration: Secondary | ICD-10-CM | POA: Diagnosis present

## 2016-02-02 DIAGNOSIS — M810 Age-related osteoporosis without current pathological fracture: Secondary | ICD-10-CM | POA: Diagnosis present

## 2016-02-02 DIAGNOSIS — E1143 Type 2 diabetes mellitus with diabetic autonomic (poly)neuropathy: Secondary | ICD-10-CM | POA: Diagnosis present

## 2016-02-02 DIAGNOSIS — G4733 Obstructive sleep apnea (adult) (pediatric): Secondary | ICD-10-CM | POA: Diagnosis present

## 2016-02-02 DIAGNOSIS — N189 Chronic kidney disease, unspecified: Secondary | ICD-10-CM | POA: Diagnosis present

## 2016-02-02 DIAGNOSIS — E785 Hyperlipidemia, unspecified: Secondary | ICD-10-CM | POA: Diagnosis present

## 2016-02-02 DIAGNOSIS — I10 Essential (primary) hypertension: Secondary | ICD-10-CM | POA: Diagnosis present

## 2016-02-02 DIAGNOSIS — N183 Chronic kidney disease, stage 3 (moderate): Secondary | ICD-10-CM | POA: Diagnosis not present

## 2016-02-02 DIAGNOSIS — I252 Old myocardial infarction: Secondary | ICD-10-CM | POA: Diagnosis not present

## 2016-02-02 DIAGNOSIS — Z794 Long term (current) use of insulin: Secondary | ICD-10-CM

## 2016-02-02 DIAGNOSIS — Z87891 Personal history of nicotine dependence: Secondary | ICD-10-CM | POA: Diagnosis not present

## 2016-02-02 DIAGNOSIS — K5521 Angiodysplasia of colon with hemorrhage: Secondary | ICD-10-CM | POA: Diagnosis not present

## 2016-02-02 DIAGNOSIS — K3184 Gastroparesis: Secondary | ICD-10-CM | POA: Diagnosis present

## 2016-02-02 DIAGNOSIS — IMO0002 Reserved for concepts with insufficient information to code with codable children: Secondary | ICD-10-CM | POA: Diagnosis present

## 2016-02-02 DIAGNOSIS — Z961 Presence of intraocular lens: Secondary | ICD-10-CM | POA: Diagnosis present

## 2016-02-02 LAB — COMPREHENSIVE METABOLIC PANEL
ALK PHOS: 145 U/L — AB (ref 38–126)
ALT: 20 U/L (ref 14–54)
AST: 23 U/L (ref 15–41)
Albumin: 3.1 g/dL — ABNORMAL LOW (ref 3.5–5.0)
Anion gap: 17 — ABNORMAL HIGH (ref 5–15)
BILIRUBIN TOTAL: 1 mg/dL (ref 0.3–1.2)
BUN: 84 mg/dL — AB (ref 6–20)
CALCIUM: 9.4 mg/dL (ref 8.9–10.3)
CO2: 17 mmol/L — ABNORMAL LOW (ref 22–32)
CREATININE: 3.13 mg/dL — AB (ref 0.44–1.00)
Chloride: 100 mmol/L — ABNORMAL LOW (ref 101–111)
GFR calc Af Amer: 15 mL/min — ABNORMAL LOW (ref >60)
GFR, EST NON AFRICAN AMERICAN: 13 mL/min — AB (ref >60)
GLUCOSE: 318 mg/dL — AB (ref 65–99)
Potassium: 5.1 mmol/L (ref 3.5–5.1)
Sodium: 134 mmol/L — ABNORMAL LOW (ref 135–145)
TOTAL PROTEIN: 6.5 g/dL (ref 6.5–8.1)

## 2016-02-02 LAB — GLUCOSE, CAPILLARY
GLUCOSE-CAPILLARY: 288 mg/dL — AB (ref 65–99)
Glucose-Capillary: 281 mg/dL — ABNORMAL HIGH (ref 65–99)

## 2016-02-02 LAB — CBC WITH DIFFERENTIAL/PLATELET
BASOS PCT: 0 %
Basophils Absolute: 0 10*3/uL (ref 0.0–0.1)
EOS ABS: 0 10*3/uL (ref 0.0–0.7)
Eosinophils Relative: 0 %
HCT: 18.7 % — ABNORMAL LOW (ref 36.0–46.0)
Hemoglobin: 5.8 g/dL — CL (ref 12.0–15.0)
LYMPHS ABS: 1 10*3/uL (ref 0.7–4.0)
Lymphocytes Relative: 18 %
MCH: 26.4 pg (ref 26.0–34.0)
MCHC: 31 g/dL (ref 30.0–36.0)
MCV: 85 fL (ref 78.0–100.0)
MONO ABS: 0.4 10*3/uL (ref 0.1–1.0)
MONOS PCT: 7 %
Neutro Abs: 4 10*3/uL (ref 1.7–7.7)
Neutrophils Relative %: 74 %
Platelets: 287 10*3/uL (ref 150–400)
RBC: 2.2 MIL/uL — ABNORMAL LOW (ref 3.87–5.11)
RDW: 16.5 % — AB (ref 11.5–15.5)
WBC: 5.4 10*3/uL (ref 4.0–10.5)

## 2016-02-02 LAB — PROTIME-INR
INR: 1.37 (ref 0.00–1.49)
PROTHROMBIN TIME: 17 s — AB (ref 11.6–15.2)

## 2016-02-02 MED ORDER — INSULIN GLARGINE 100 UNIT/ML ~~LOC~~ SOLN
14.0000 [IU] | Freq: Two times a day (BID) | SUBCUTANEOUS | Status: DC
  Administered 2016-02-02 – 2016-02-03 (×2): 14 [IU] via SUBCUTANEOUS
  Filled 2016-02-02 (×3): qty 0.14

## 2016-02-02 MED ORDER — ACETAMINOPHEN 325 MG PO TABS: 650.0000 mg | ORAL_TABLET | Freq: Four times a day (QID) | ORAL | Status: DC | PRN

## 2016-02-02 MED ORDER — SODIUM CHLORIDE 0.9 % IV SOLN: 250.0000 mL | INTRAVENOUS | Status: DC | PRN

## 2016-02-02 MED ORDER — OXYCODONE HCL 5 MG PO TABS
5.0000 mg | ORAL_TABLET | ORAL | Status: DC | PRN
  Administered 2016-02-02: 5 mg via ORAL
  Filled 2016-02-02 (×2): qty 1

## 2016-02-02 MED ORDER — FUROSEMIDE 10 MG/ML IJ SOLN
20.0000 mg | Freq: Once | INTRAMUSCULAR | Status: AC
  Administered 2016-02-02: 20 mg via INTRAVENOUS
  Filled 2016-02-02: qty 2

## 2016-02-02 MED ORDER — SODIUM CHLORIDE 0.9 % IV SOLN: INTRAVENOUS | Status: AC

## 2016-02-02 MED ORDER — SODIUM CHLORIDE 0.9% FLUSH: 3.0000 mL | Freq: Two times a day (BID) | INTRAVENOUS | Status: DC

## 2016-02-02 MED ORDER — ACETAMINOPHEN 650 MG RE SUPP: 650.0000 mg | Freq: Four times a day (QID) | RECTAL | Status: DC | PRN

## 2016-02-02 MED ORDER — ATORVASTATIN CALCIUM 40 MG PO TABS
40.0000 mg | ORAL_TABLET | Freq: Every day | ORAL | Status: DC
  Administered 2016-02-02: 40 mg via ORAL
  Filled 2016-02-02: qty 1

## 2016-02-02 MED ORDER — SODIUM CHLORIDE 0.9% FLUSH: 3.0000 mL | INTRAVENOUS | Status: DC | PRN

## 2016-02-02 MED ORDER — ONDANSETRON HCL 4 MG PO TABS: 4.0000 mg | ORAL_TABLET | Freq: Four times a day (QID) | ORAL | Status: DC | PRN

## 2016-02-02 MED ORDER — SODIUM CHLORIDE 0.9 % IV SOLN
Freq: Once | INTRAVENOUS | Status: AC
  Administered 2016-02-02: 18:00:00 via INTRAVENOUS

## 2016-02-02 MED ORDER — INSULIN ASPART 100 UNIT/ML ~~LOC~~ SOLN
0.0000 [IU] | SUBCUTANEOUS | Status: DC
  Administered 2016-02-02 (×2): 8 [IU] via SUBCUTANEOUS
  Administered 2016-02-03: 3 [IU] via SUBCUTANEOUS

## 2016-02-02 MED ORDER — AMLODIPINE BESYLATE 5 MG PO TABS
5.0000 mg | ORAL_TABLET | Freq: Every day | ORAL | Status: DC
  Administered 2016-02-02 – 2016-02-03 (×2): 5 mg via ORAL
  Filled 2016-02-02 (×2): qty 1

## 2016-02-02 MED ORDER — ISOSORBIDE MONONITRATE ER 60 MG PO TB24
60.0000 mg | ORAL_TABLET | Freq: Every day | ORAL | Status: DC
  Administered 2016-02-02 – 2016-02-03 (×2): 60 mg via ORAL
  Filled 2016-02-02 (×2): qty 1

## 2016-02-02 MED ORDER — ONDANSETRON HCL 4 MG/2ML IJ SOLN: 4.0000 mg | Freq: Four times a day (QID) | INTRAMUSCULAR | Status: DC | PRN

## 2016-02-02 MED ORDER — SODIUM CHLORIDE 0.9% FLUSH
10.0000 mL | INTRAVENOUS | Status: DC | PRN
  Administered 2016-02-02: 20 mL
  Administered 2016-02-03: 10 mL
  Administered 2016-02-03: 20 mL
  Filled 2016-02-02 (×2): qty 40

## 2016-02-02 MED ORDER — SODIUM CHLORIDE 0.9% FLUSH
3.0000 mL | Freq: Two times a day (BID) | INTRAVENOUS | Status: DC
  Administered 2016-02-02: 3 mL via INTRAVENOUS

## 2016-02-02 NOTE — ED Notes (Signed)
Attempted report 

## 2016-02-02 NOTE — H&P (Addendum)
[name] History and Physical  [name] [id number] DOB: 1933/02/18 DOA: 02/02/2016  Referring physician:  PCP: [name], MD   Chief Complaint: Bloody stools  HPI: [name] is a 82 y.o. female with a past medical history of coronary artery disease, history of CVA, congestive heart failure with EF of 20-25%, insulin dependent diabetes mellitus, as well as having history of GI bleed secondary to arteriovenous malformation recently admitted from 01/16/2071 01/22/2016 when she presented with lower GI bleed. She was brought in by her family members today with complaints of GI bleed. She has had multiple dark/black stools over the past several days. Denies hematemesis. Family reporting she has had a steep functional decline becoming progressively weak, unable to get a bed, less interactive, decreased by mouth intake. At baseline she has kind of impairment, ambulates with walker, requires assistance with most activities of daily living. Lab work in the emergency room revealed a hemoglobin of 5.8, decreased from 9.8 on 01/22/2016. She was seen and evaluated by GI who recommended supportive care, blood transfusions, without recommendations for colonoscopy/EGD.                                                 Review of Systems:  Could not obtain reliable review of systems, patient minimally verbal, had a steep functional decline  Past Medical History  Diagnosis Date  . Asthma   . CAD (coronary artery disease)     Pt reports MI in 2006 (no documentation).  Cardiolite in 05/2002 and 07/2006 did not reveal any reversible ischemia.  Pt follows with Dr. Rex Kras at [facility].  . CHF (congestive heart failure) (HCC)     EF 25-30% with dilated LV, mild LVH, severe hypokinesis, and mod-severe reduction in RV function  . Osteoporosis   . HYPERTENSION 08/03/2006  . GASTROPARESIS, DIABETIC 08/03/2006  . HYPERLIPIDEMIA 08/03/2006  . OBSTRUCTIVE SLEEP APNEA 01/06/2008  . PERIPHERAL NEUROPATHY  08/03/2006  . GERD 08/03/2006  . LOW BACK PAIN, CHRONIC 08/03/2006  . OSTEOPOROSIS 03/21/2009  . CEREBRAL EMBOLISM, WITH INFARCTION 07/02/2010  . Angina   . Myocardial infarction ([place]) "2 or 3"  . Pneumonia 02/26/12    "a few times; probably even today"  . DIABETES MELLITUS, TYPE II 11/04/1983  . Blood transfusion     multiple in 2012, 2015.   . GI bleed 08/2011 and 01/2014  . Chronic daily headache     migraines as well.   . Stroke [facility]) summer 2011    "made my left hip worse"  . Uterine cancer ([place])   . Nonischemic cardiomyopathy ([place]) 05/2010    Left heart catheterization:2011. Nonobstructive coronary artery disease.  . Pulmonary hypertension ([place])   . NSTEMI (non-ST elevated myocardial infarction) ([place]) 01/2014    type II with CVA  . Acute embolic stroke ([place]) 0000000  . Non-ST elevation MI (NSTEMI) ([place]) 02/04/2014  . E. coli UTI 03/02/2014  . Renal failure, acute ([place]) 02/04/2014  . Iron deficiency anemia secondary to blood loss (chronic) 01/06/2008    Qualifier: Diagnosis of  By: Cheryl Hosteller MD, Veronique D.   . Nonsustained ventricular tachycardia ([place])   . Angiodysplasia of intestine with hemorrhage 05/13/2014  . CKD (chronic kidney disease) stage 4, GFR 15-29 ml/min (HCC) 08/21/2014   Past Surgical History  Procedure Laterality Date  . Esophagogastroduodenoscopy  08/26/2011    Procedure:  ESOPHAGOGASTRODUODENOSCOPY (EGD);  Surgeon: [name], MD;  Location: [facility] ENDOSCOPY;  Service: Endoscopy;  Laterality: N/A;  . Colonoscopy  08/28/2011    Procedure: COLONOSCOPY;  Surgeon: [name], MD;  Location: [place];  Service: Endoscopy;  Laterality: N/A;  . Vaginal hysterectomy    . Tubal ligation    . Cataract extraction w/ intraocular lens  implant, bilateral    . Toe surgery      "right big toe; operated on it to straighten it out; it was under"  . Polysomnogram  10/17/2005    AHI-7.28/hr. AHI REM-20.8/hr. Average oxygen saturation range during REM and NREM was 97%. Lowest  oxygen saturation during REM sleep was 90%.  . Carotid duplex  05/28/2010    No significant extracranial carotid artery stenosis demonstrated. Vertebrals are patent w/ antegrade flow.  . Cardiac catheterization  05/24/2010    No intervention - recommed medical therapy.  . Cardiovascular stress test  08/07/2006    Moderate-severe defect seen in Basal inferior, Mid inferoseptal, Mid inferior, Mid inferolateral, and Apical inferior regions - consistent w/ infarct/scar. No scintigraphic evidence of inducible myocardial ischemia.  . Transthoracic echocardiogram  08/29/2011    EF 55-60%, moderate LVH,   . Esophagogastroduodenoscopy N/A 02/08/2014    Procedure: ESOPHAGOGASTRODUODENOSCOPY (EGD);  Surgeon: [name], MD;  Location: [facility] ENDOSCOPY;  Service: Endoscopy;  Laterality: N/A;  . Colonoscopy N/A 05/12/2014    Procedure: COLONOSCOPY;  Surgeon: [name], MD;  Location: [place];  Service: Endoscopy;  Laterality: N/A;  . Enteroscopy N/A 05/13/2014    Procedure: ENTEROSCOPY;  Surgeon: [name], MD;  Location: [place];  Service: Endoscopy;  Laterality: N/A;  . Enteroscopy N/A 07/13/2014    Procedure: small bowel enteroscopy;  Surgeon: Jerene Bears, MD;  Location: [facility] ENDOSCOPY;  Service: Endoscopy;  Laterality: N/A;  requests slim colonoscope  . Enteroscopy N/A 01/21/2016    Procedure: ENTEROSCOPY;  Surgeon: Manus Gunning, MD;  Location: [place];  Service: Gastroenterology;  Laterality: N/A;   Social History:  reports that she has never smoked. She quit smokeless tobacco use about 9 years ago. Her smokeless tobacco use included Snuff. She reports that she does not drink alcohol or use illicit drugs.  Allergies  Allergen Reactions  . Baking Soda-Fluoride [Sodium Fluoride] Nausea And Vomiting  . Magnesium Hydroxide Nausea And Vomiting    Family History  Problem Relation Age of Onset  . Diabetes insipidus Mother   . Hypertension Mother   . Hypertension Father   .  Hypertension Sister   . Hypertension Child   . Stomach cancer Brother     Prior to Admission medications   Medication Sig Start Date End Date Taking? Authorizing Provider  acetaminophen (TYLENOL) 500 MG tablet Take 500 mg by mouth every 6 (six) hours as needed for moderate pain or headache.   Yes Historical Provider, MD  albuterol (PROAIR HFA) 108 (90 BASE) MCG/ACT inhaler Inhale 2 puffs into the lungs every 6 (six) hours as needed for wheezing. 05/25/14  Yes Daniel J Angiulli, PA-C  amLODipine (NORVASC) 5 MG tablet Take 1 tablet by mouth daily. 12/24/15  Yes Historical Provider, MD  aspirin 81 MG tablet Take 81 mg by mouth daily.   Yes Historical Provider, MD  atorvastatin (LIPITOR) 40 MG tablet TAKE 1 TABLET (40 MG TOTAL) BY MOUTH DAILY AT 6 PM. 11/12/15  Yes Lorretta Harp, MD  doxycycline (VIBRAMYCIN) 100 MG capsule Take 1 capsule (100 mg total) by mouth 2 (two)  times daily. 01/22/16  Yes Florencia Reasons, MD  guaiFENesin (MUCINEX) 600 MG 12 hr tablet Take 1 tablet (600 mg total) by mouth 2 (two) times daily. 01/22/16  Yes Florencia Reasons, MD  hydrALAZINE (APRESOLINE) 25 MG tablet Take 1 tablet (25 mg total) by mouth every 8 (eight) hours. 12/24/15  Yes Shanker Kristeen Mans, MD  insulin aspart (NOVOLOG FLEXPEN) 100 UNIT/ML FlexPen Inject 2-5 Units into the skin 3 (three) times daily with meals.   Yes Historical Provider, MD  insulin glargine (LANTUS) 100 UNIT/ML injection Inject 0.07 mLs (7 Units total) into the skin at bedtime. Patient taking differently: Inject 14 Units into the skin 2 (two) times daily.  11/07/15  Yes Geradine Girt, DO  iron polysaccharides (NIFEREX) 150 MG capsule Take 1 capsule (150 mg total) by mouth every other day. 01/22/16  Yes Florencia Reasons, MD  isosorbide mononitrate (IMDUR) 60 MG 24 hr tablet Take 1 tablet (60 mg total) by mouth daily. 05/25/14  Yes Daniel J Angiulli, PA-C  loratadine (ALLERGY RELIEF) 10 MG tablet Take 10 mg by mouth daily. Reported on 12/07/2015   Yes Historical Provider, MD    magnesium oxide (MAG-OX) 400 (241.3 MG) MG tablet Take 1 tablet (400 mg total) by mouth 2 (two) times daily. 05/25/14  Yes Daniel J Angiulli, PA-C  metolazone (ZAROXOLYN) 2.5 MG tablet 1 TABLET AS NEEDED WHEN WEIGHT IS OVER 152 LBS 01/22/16  Yes Florencia Reasons, MD  metoprolol succinate (TOPROL-XL) 25 MG 24 hr tablet Take 1 tablet by mouth daily. 12/24/15  Yes Historical Provider, MD  omeprazole (PRILOSEC) 20 MG capsule Take 1 capsule (20 mg total) by mouth 2 (two) times daily before a meal. 01/12/16  Yes Allie Bossier, MD  polyethylene glycol [facility] / Floria Raveling) packet Take 17 g by mouth daily as needed for mild constipation.    Yes Historical Provider, MD  potassium chloride SA (KLOR-CON M20) 20 MEQ tablet TAKE 1 TABLET DAILY AND TAKE AN EXTRA TABLET WHEN YOU HAVE TO TAKE AN EXTRA METOLAZONE FOR WT OVER 152 LBS 01/11/16  Yes Eileen Stanford, PA-C  torsemide (DEMADEX) 20 MG tablet Take 2 tablets (40 mg total) by mouth daily. 12/24/15  Yes Shanker Kristeen Mans, MD   Physical Exam: Filed Vitals:   02/02/16 1200 02/02/16 1215 02/02/16 1245 02/02/16 1315  BP: 134/51 133/54 135/55 137/52  Pulse: 54 56 54 52  Temp:      TempSrc:      Resp: 15 29 14 22   SpO2: 100% 100% 96% 100%    Wt Readings from Last 3 Encounters:  01/18/16 64.9 kg (143 lb 1.3 oz)  01/16/16 60.328 kg (133 lb)  01/11/16 68.221 kg (150 lb 6.4 oz)    General:  Minimally interactive, appears pale, weak Eyes: PERRL, normal lids, irises, pale conjunctiva ENT: grossly normal hearing, lips & tongue, dry Neck: no LAD, masses or thyromegaly Cardiovascular: 3-6 systolic ejection murmur RRR. No LE edema. Telemetry: SR, no arrhythmias  Respiratory: CTA bilaterally, no w/r/r. Normal respiratory effort. Abdomen: soft, ntnd Skin: no rash or induration seen on limited exam Musculoskeletal: grossly normal tone BUE/BLE Psychiatric: Minimally interactive, poor historian, cognitive impairment Neurologic: grossly non-focal.          Labs on  Admission:  Basic Metabolic Panel:  Recent Labs Lab 02/02/16 1130  NA 134*  K 5.1  CL 100*  CO2 17*  GLUCOSE 318*  BUN 84*  CREATININE 3.13*  CALCIUM 9.4   Liver Function Tests:  Recent Labs Lab  02/02/16 1130  AST 23  ALT 20  ALKPHOS 145*  BILITOT 1.0  PROT 6.5  ALBUMIN 3.1*   No results for input(s): LIPASE, AMYLASE in the last 168 hours. No results for input(s): AMMONIA in the last 168 hours. CBC:  Recent Labs Lab 02/02/16 1130  WBC 5.4  NEUTROABS 4.0  HGB 5.8*  HCT 18.7*  MCV 85.0  PLT 287   Cardiac Enzymes: No results for input(s): CKTOTAL, CKMB, CKMBINDEX, TROPONINI in the last 168 hours.  BNP (last 3 results)  Recent Labs  11/04/15 1350 12/20/15 1736 01/18/16 0755  BNP 1841.7* 1957.6* 1028.7*    ProBNP (last 3 results) No results for input(s): PROBNP in the last 8760 hours.  CBG: No results for input(s): GLUCAP in the last 168 hours.  Radiological Exams on Admission: No results found.  EKG: Independently reviewed.   Assessment/Plan Active Problems:   DM (diabetes mellitus), type 2, uncontrolled (HCC)   Essential hypertension   GI bleed   GIB (gastrointestinal bleeding)   Blood loss anemia   CKD (chronic kidney disease)   Insulin dependent diabetes mellitus (HCC)   1. Upper GI bleed. Mrs. Mcgillivray is an 82 year old female with a history of recurrent GI bleed but he secondary to arteriovenous malformations. She has undergone upper endoscopy in the past with ablation of proximal jejunal AVMs x 2. Her last EGD was performed on 01/21/2016 which was unremarkable. She has been seen and evaluated by GI who recommended supportive care, blood transfusion, did not recommend repeat procedures at this time. Discontinue antiplatelet therapy. Transfuse with packed red blood cells. 2. Acute blood loss anemia. Family members reporting a steep functional decline over the past several days as lab work in the emergency room revealed a hemoglobin of 5.8. She  continues to have dark/black stools. As mentioned above she has a history of recurrent GI bleed secondary to arteriovenous malformations and has undergone upper endoscopies last one performed on 01/21/2016. Will type and cross and transfuse 2 units packed red blood cells, repeat labs in a.m. 3. Acute on chronic renal failure. Patient having a history of stage III/4 chronic kidney disease, last creatinine 2.06 on 01/22/2016. Lab work performed in the emergency room showing creatinine increased at 3.13 with BUN of 84. I suspect related to hypovolemia in setting of blood loss along with diuretic therapy as she had been taking Demadex. Will stop diuretics, provide gentle IV fluids along with blood transfusion. Monitor volume status closely. Repeat labs in a.m. 4. History of hypertension. She is on multiple antihypertensive agents, presenting dehydrated, anemic with hemoglobin of 5.8. I'm concerned with precipitating hypotension, will hold her metoprolol and hydralazine, continue Norvasc and Imdur.  5. Diabetes mellitus. Will continue Lantus 14 units subcutaneous twice daily, provide sliding scale coverage with Accu-Cheks 6. History of chronic systolic congestive heart failure. Last transthoracic echocardiogram performed on 12/21/2015 that showed ejection fraction of 20-25%. She presents with GI bleed, looks dry on physical examination with the development of acute on chronic renal failure. Will hold diuretic therapy, provide gentle IV fluid hydration, repeat labs in a.m.  Code Status: CODE STATUS discussed with patient and family members, confirmed she is a DO NOT RESUSCITATE DVT Prophylaxis: SCDs Family Communication: Spoke with her adult children at bedside Disposition Plan: Will admit to the inpatient service, anticipate she'll require greater than 2 nights hospitalization  Time spent: 65 minutes  Kelvin Cellar [name] Pager [phone]

## 2016-02-02 NOTE — Progress Notes (Signed)
Peripherally Inserted Central Catheter/Midline Placement  The IV Nurse has discussed with the patient and/or persons authorized to consent for the patient, the purpose of this procedure and the potential benefits and risks involved with this procedure.  The benefits include less needle sticks, lab draws from the catheter and patient may be discharged home with the catheter.  Risks include, but not limited to, infection, bleeding, blood clot (thrombus formation), and puncture of an artery; nerve damage and irregular heat beat.  Alternatives to this procedure were also discussed.  2 daughters and son at bedside.  Pt gave verbal consent and son signed written consent.  PICC/Midline Placement Documentation  PICC Double Lumen 02/02/16 PICC Right Brachial 39 cm 0 cm (Active)  Indication for Insertion or Continuance of Line Limited venous access - need for IV therapy >5 days (PICC only);Poor Vasculature-patient has had multiple peripheral attempts or PIVs lasting less than 24 hours 02/02/2016  2:45 PM  Exposed Catheter (cm) 0 cm 02/02/2016  2:45 PM  Site Assessment Clean;Dry;Intact 02/02/2016  2:45 PM  Lumen #1 Status Flushed;Saline locked;Blood return noted 02/02/2016  2:45 PM  Lumen #2 Status Flushed;Saline locked;Blood return noted 02/02/2016  2:45 PM  Dressing Type Transparent 02/02/2016  2:45 PM  Dressing Status Clean;Dry;Intact;Antimicrobial disc in place 02/02/2016  2:45 PM  Line Care Connections checked and tightened 02/02/2016  2:45 PM  Line Adjustment (NICU/IV Team Only) No 02/02/2016  2:45 PM  Dressing Intervention New dressing 02/02/2016  2:45 PM  Dressing Change Due 02/09/16 02/02/2016  2:45 PM       Rolena Infante 02/02/2016, 2:47 PM

## 2016-02-02 NOTE — ED Notes (Signed)
IV team at bedside 

## 2016-02-02 NOTE — ED Notes (Signed)
Pt arrives from home with c/o black, tarry, foul smelling stools. Pt family states she has had issues with gi bleeds in the past and her last hgb check was 6.

## 2016-02-02 NOTE — Consult Note (Signed)
Referring Provider: No ref. provider found Primary Care Physician:  [name], MD Primary Gastroenterologist:  Dr. Carlean Purl  Reason for Consultation:  Recurrent anemia   IMPRESSION:  #67 82 year old African-American female with history of previous GI bleeds and recurrent anemia felt secondary to AVMs.  She has undergone enteroscopy with ablation of proximal jejunal AVM 2, but most recent enteroscopy earlier this month was negative for such.  Suspect bleeding secondary to AVMs maybe deeper in small bowel.  Returns for outpatient Hgb of 6 grams earlier this week (was never contacted about it) and continues to have black stools. #2 anemia normocytic secondary to acute blood loss/superimposed on chronic iron deficiency anemia #3 congestive heart failure-EF 20-25% #4 coronary artery disease status post MI 2015 #5 history of CVA #6 insulin-dependent diabetes mellitus #7 obstructive sleep apnea #8 history of hypertension  PLAN: *Management strategy may just be close monitoring of Hgb and periodic transfusions, which was the plan at last discharge earlier this month.  Hopefully in the future they can be contacted regarding results in a more timely manner so that she can be transfused prn as an outpatient and can avoid future ED visits and admissions.  No plans for procedures currently.  Family seems in agreement with this plan. *Will be admitted to hospitalist service, ? Observation. *Transfuse to Hgb between 8-9 grams.  ZEHR, JESSICA D.  02/02/2016, 12:13 PM  Pager number [phone]     [facility] Attending   I have taken an interval history, reviewed the chart and examined the patient. I agree with the Advanced Practitioner's note, impression and recommendations.     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 female with PMH listed below (multiple medical problems).  Has recurrent anemia/GIB suspected to be secondary to small bowel AVM's.    Just admitted 4/5-4/11 for the same issue.  Small bowel endoscopy/enteroscopy by Dr. Havery Moros on 01/21/2016.  No bleed source identified.  Were questing capsule endoscopy and ? Balloon enteroscopy to rule out more distal small bowel source if bleeding/anemia recurrent, but thought likely to be a poor candidate for that.  Patient declined capsule endoscopy and they were thinking that management strategy may just be close monitoring of Hgb and periodic transfusions.  Apparently had an outpatient Hgb of 6 grams that was drawn on Wednesday by PCP and was not contacted about it.  Family called for the results today and brought her to the ED.    She continues to have 1-2 black foul smelling stools daily.  She is NOT on the iron supplements because insurance would not pay for them so they were trying to get a different formulation per the patient's son.  Very weak, mild left flank pain.   Past Medical History  Diagnosis Date  . Asthma   . CAD (coronary artery disease)     Pt reports  MI in 2006 (no documentation).  Cardiolite in 05/2002 and 07/2006 did not reveal any reversible ischemia.  Pt follows with Dr. Rex Kras at [facility].  . CHF (congestive heart failure) (HCC)     EF 25-30% with dilated LV, mild LVH, severe hypokinesis, and mod-severe reduction in RV function  . Osteoporosis   . HYPERTENSION 08/03/2006  . GASTROPARESIS, DIABETIC 08/03/2006  . HYPERLIPIDEMIA 08/03/2006  .  OBSTRUCTIVE SLEEP APNEA 01/06/2008  . PERIPHERAL NEUROPATHY 08/03/2006  . GERD 08/03/2006  . LOW BACK PAIN, CHRONIC 08/03/2006  . OSTEOPOROSIS 03/21/2009  . CEREBRAL EMBOLISM, WITH INFARCTION 07/02/2010  . Angina   . Myocardial infarction ([place]) "2 or 3"  . Pneumonia 02/26/12    "a few times; probably even today"  . DIABETES MELLITUS, TYPE II 11/04/1983  . Blood transfusion     multiple in 2012, 2015.   . GI bleed 08/2011 and 01/2014  . Chronic daily headache     migraines as well.   . Stroke [facility]) summer 2011    "made my left hip worse"  . Uterine cancer ([place])   . Nonischemic cardiomyopathy ([place]) 05/2010    Left heart catheterization:2011. Nonobstructive coronary artery disease.  . Pulmonary hypertension ([place])   . NSTEMI (non-ST elevated myocardial infarction) ([place]) 01/2014    type II with CVA  . Acute embolic stroke ([place]) 0000000  . Non-ST elevation MI (NSTEMI) ([place]) 02/04/2014  . E. coli UTI 03/02/2014  . Renal failure, acute ([place]) 02/04/2014  . Iron deficiency anemia secondary to blood loss (chronic) 01/06/2008    Qualifier: Diagnosis of  By: Tomasa Hosteller MD, Veronique D.   . Nonsustained ventricular tachycardia ([place])   . Angiodysplasia of intestine with hemorrhage 05/13/2014  . CKD (chronic kidney disease) stage 4, GFR 15-29 ml/min (HCC) 08/21/2014    Past Surgical History  Procedure Laterality Date  . Esophagogastroduodenoscopy  08/26/2011    Procedure: ESOPHAGOGASTRODUODENOSCOPY (EGD);  Surgeon: [name], MD;  Location: [facility] ENDOSCOPY;  Service: Endoscopy;  Laterality: N/A;  . Colonoscopy  08/28/2011    Procedure: COLONOSCOPY;  Surgeon: [name], MD;  Location: [place];  Service: Endoscopy;  Laterality: N/A;  . Vaginal hysterectomy    . Tubal ligation    . Cataract extraction w/ intraocular lens  implant, bilateral    . Toe surgery      "right big toe; operated on it to straighten it out; it was under"  . Polysomnogram  10/17/2005    AHI-7.28/hr. AHI REM-20.8/hr.  Average oxygen saturation range during REM and NREM was 97%. Lowest oxygen saturation during REM sleep was 90%.  . Carotid duplex  05/28/2010    No significant extracranial carotid artery stenosis demonstrated. Vertebrals are patent w/ antegrade flow.  . Cardiac catheterization  05/24/2010    No intervention - recommed medical therapy.  . Cardiovascular stress test  08/07/2006    Moderate-severe defect seen in Basal inferior, Mid inferoseptal, Mid inferior, Mid inferolateral, and Apical inferior regions - consistent w/ infarct/scar. No scintigraphic evidence of inducible myocardial ischemia.  . Transthoracic echocardiogram  08/29/2011    EF 55-60%, moderate LVH,   . Esophagogastroduodenoscopy N/A 02/08/2014    Procedure: ESOPHAGOGASTRODUODENOSCOPY (EGD);  Surgeon: [name], MD;  Location: [facility] ENDOSCOPY;  Service: Endoscopy;  Laterality: N/A;  . Colonoscopy N/A 05/12/2014    Procedure: COLONOSCOPY;  Surgeon: [name], MD;  Location: [place];  Service: Endoscopy;  Laterality: N/A;  . Enteroscopy N/A 05/13/2014    Procedure: ENTEROSCOPY;  Surgeon: [name],  MD;  Location: [place] ENDOSCOPY;  Service: Endoscopy;  Laterality: N/A;  . Enteroscopy N/A 07/13/2014    Procedure: small bowel enteroscopy;  Surgeon: Jerene Bears, MD;  Location: [facility] ENDOSCOPY;  Service: Endoscopy;  Laterality: N/A;  requests slim colonoscope  . Enteroscopy N/A 01/21/2016    Procedure: ENTEROSCOPY;  Surgeon: Manus Gunning, MD;  Location: [place];  Service: Gastroenterology;  Laterality: N/A;    Prior to Admission medications   Medication Sig Start Date End Date Taking? Authorizing Provider  acetaminophen (TYLENOL) 500 MG tablet Take 500 mg by mouth every 6 (six) hours as needed for moderate pain or headache.   Yes Historical Provider, MD  albuterol (PROAIR HFA) 108 (90 BASE) MCG/ACT inhaler Inhale 2 puffs into the lungs every 6 (six) hours as needed for wheezing. 05/25/14  Yes Daniel J Angiulli, PA-C    amLODipine (NORVASC) 5 MG tablet Take 1 tablet by mouth daily. 12/24/15  Yes Historical Provider, MD  aspirin 81 MG tablet Take 81 mg by mouth daily.   Yes Historical Provider, MD  atorvastatin (LIPITOR) 40 MG tablet TAKE 1 TABLET (40 MG TOTAL) BY MOUTH DAILY AT 6 PM. 11/12/15  Yes Lorretta Harp, MD  doxycycline (VIBRAMYCIN) 100 MG capsule Take 1 capsule (100 mg total) by mouth 2 (two) times daily. 01/22/16  Yes Florencia Reasons, MD  guaiFENesin (MUCINEX) 600 MG 12 hr tablet Take 1 tablet (600 mg total) by mouth 2 (two) times daily. 01/22/16  Yes Florencia Reasons, MD  hydrALAZINE (APRESOLINE) 25 MG tablet Take 1 tablet (25 mg total) by mouth every 8 (eight) hours. 12/24/15  Yes Shanker Kristeen Mans, MD  insulin aspart (NOVOLOG FLEXPEN) 100 UNIT/ML FlexPen Inject 2-5 Units into the skin 3 (three) times daily with meals.   Yes Historical Provider, MD  insulin glargine (LANTUS) 100 UNIT/ML injection Inject 0.07 mLs (7 Units total) into the skin at bedtime. Patient taking differently: Inject 14 Units into the skin 2 (two) times daily.  11/07/15  Yes Geradine Girt, DO  iron polysaccharides (NIFEREX) 150 MG capsule Take 1 capsule (150 mg total) by mouth every other day. 01/22/16  Yes Florencia Reasons, MD  isosorbide mononitrate (IMDUR) 60 MG 24 hr tablet Take 1 tablet (60 mg total) by mouth daily. 05/25/14  Yes Daniel J Angiulli, PA-C  loratadine (ALLERGY RELIEF) 10 MG tablet Take 10 mg by mouth daily. Reported on 12/07/2015   Yes Historical Provider, MD  magnesium oxide (MAG-OX) 400 (241.3 MG) MG tablet Take 1 tablet (400 mg total) by mouth 2 (two) times daily. 05/25/14  Yes Daniel J Angiulli, PA-C  metolazone (ZAROXOLYN) 2.5 MG tablet 1 TABLET AS NEEDED WHEN WEIGHT IS OVER 152 LBS 01/22/16  Yes Florencia Reasons, MD  metoprolol succinate (TOPROL-XL) 25 MG 24 hr tablet Take 1 tablet by mouth daily. 12/24/15  Yes Historical Provider, MD  omeprazole (PRILOSEC) 20 MG capsule Take 1 capsule (20 mg total) by mouth 2 (two) times daily before a meal. 01/12/16   Yes Allie Bossier, MD  polyethylene glycol [facility] / Floria Raveling) packet Take 17 g by mouth daily as needed for mild constipation.    Yes Historical Provider, MD  potassium chloride SA (KLOR-CON M20) 20 MEQ tablet TAKE 1 TABLET DAILY AND TAKE AN EXTRA TABLET WHEN YOU HAVE TO TAKE AN EXTRA METOLAZONE FOR WT OVER 152 LBS 01/11/16  Yes Eileen Stanford, PA-C  torsemide (DEMADEX) 20 MG tablet Take 2 tablets (40 mg total) by mouth daily. 12/24/15  Yes Shanker  Kristeen Mans, MD    No current facility-administered medications for this encounter.   Current Outpatient Prescriptions  Medication Sig Dispense Refill  . acetaminophen (TYLENOL) 500 MG tablet Take 500 mg by mouth every 6 (six) hours as needed for moderate pain or headache.    . albuterol (PROAIR HFA) 108 (90 BASE) MCG/ACT inhaler Inhale 2 puffs into the lungs every 6 (six) hours as needed for wheezing. 8.5 g 11  . amLODipine (NORVASC) 5 MG tablet Take 1 tablet by mouth daily.    Marland Kitchen aspirin 81 MG tablet Take 81 mg by mouth daily.    Marland Kitchen atorvastatin (LIPITOR) 40 MG tablet TAKE 1 TABLET (40 MG TOTAL) BY MOUTH DAILY AT 6 PM. 30 tablet 10  . doxycycline (VIBRAMYCIN) 100 MG capsule Take 1 capsule (100 mg total) by mouth 2 (two) times daily. 6 capsule 0  . guaiFENesin (MUCINEX) 600 MG 12 hr tablet Take 1 tablet (600 mg total) by mouth 2 (two) times daily. 30 tablet 0  . hydrALAZINE (APRESOLINE) 25 MG tablet Take 1 tablet (25 mg total) by mouth every 8 (eight) hours. 90 tablet 0  . insulin aspart (NOVOLOG FLEXPEN) 100 UNIT/ML FlexPen Inject 2-5 Units into the skin 3 (three) times daily with meals.    . insulin glargine (LANTUS) 100 UNIT/ML injection Inject 0.07 mLs (7 Units total) into the skin at bedtime. (Patient taking differently: Inject 14 Units into the skin 2 (two) times daily. ) 10 mL 11  . iron polysaccharides (NIFEREX) 150 MG capsule Take 1 capsule (150 mg total) by mouth every other day. 30 capsule 0  . isosorbide mononitrate (IMDUR) 60 MG 24 hr  tablet Take 1 tablet (60 mg total) by mouth daily. 30 tablet 2  . loratadine (ALLERGY RELIEF) 10 MG tablet Take 10 mg by mouth daily. Reported on 12/07/2015    . magnesium oxide (MAG-OX) 400 (241.3 MG) MG tablet Take 1 tablet (400 mg total) by mouth 2 (two) times daily. 60 tablet 1  . metolazone (ZAROXOLYN) 2.5 MG tablet 1 TABLET AS NEEDED WHEN WEIGHT IS OVER 152 LBS 30 tablet 0  . metoprolol succinate (TOPROL-XL) 25 MG 24 hr tablet Take 1 tablet by mouth daily.    Marland Kitchen omeprazole (PRILOSEC) 20 MG capsule Take 1 capsule (20 mg total) by mouth 2 (two) times daily before a meal. 30 capsule 11  . polyethylene glycol (MIRALAX / GLYCOLAX) packet Take 17 g by mouth daily as needed for mild constipation.     . potassium chloride SA (KLOR-CON M20) 20 MEQ tablet TAKE 1 TABLET DAILY AND TAKE AN EXTRA TABLET WHEN YOU HAVE TO TAKE AN EXTRA METOLAZONE FOR WT OVER 152 LBS 45 tablet 3  . torsemide (DEMADEX) 20 MG tablet Take 2 tablets (40 mg total) by mouth daily. 60 tablet 0    Allergies as of 02/02/2016 - Review Complete 02/02/2016  Allergen Reaction Noted  . Baking soda-fluoride [sodium fluoride] Nausea And Vomiting 03/28/2011  . Magnesium hydroxide Nausea And Vomiting 08/28/2011    Family History  Problem Relation Age of Onset  . Diabetes insipidus Mother   . Hypertension Mother   . Hypertension Father   . Hypertension Sister   . Hypertension Child   . Stomach cancer Brother     Social History   Social History  . Marital Status: Widowed    Spouse Name: N/A  . Number of Children: 7  . Years of Education: N/A   Occupational History  . Disabled    Social  History Main Topics  . Smoking status: Never Smoker   . Smokeless tobacco: Former Systems developer    Types: Snuff    Quit date: 10/13/2006     Comment: 02/26/12 "stopped snuff 4-6 years ago"  . Alcohol Use: No  . Drug Use: No  . Sexual Activity: No   Other Topics Concern  . Not on file   Social History Narrative   ** Merged History Encounter **        Lives with daughter    Review of Systems: Ten point ROS is O/W negative except as mentioned in HPI.  Physical Exam: Vital signs in last 24 hours: Temp:  [97.6 F (36.4 C)] 97.6 F (36.4 C) (04/22 1041) Pulse Rate:  [55-56] 56 (04/22 1130) Resp:  [20-21] 21 (04/22 1130) BP: (120-130)/(41-53) 120/41 mmHg (04/22 1130) SpO2:  [100 %] 100 % (04/22 1130)   General:  Alert, Well-developed, well-nourished, pleasant and cooperative in NAD Head:  Normocephalic and atraumatic. Eyes:  Sclera clear, no icterus.  Conjunctiva pale. Ears:  Normal auditory acuity. Mouth:  No deformity or lesions.   Lungs:  Clear throughout to auscultation.  No wheezes, crackles, or rhonchi.  Heart:  Regular rate and rhythm; no murmurs, clicks, rubs, or gallops. Abdomen:  Soft, non-distended.  BS present.  Non-tender. Msk:  Symmetrical without gross deformities. Pulses:  Normal pulses noted. Extremities:  Without clubbing or edema. Neurologic:  Alert and oriented x 4;  grossly normal neurologically. Skin:  Intact without significant lesions or rashes. Psych:  Alert and cooperative. Normal mood and affect.

## 2016-02-02 NOTE — ED Provider Notes (Signed)
CSN: [id number]     Arrival date & time 02/02/16  1013 History   First MD Initiated Contact with Patient 02/02/16 1022     Chief Complaint  Patient presents with  . GI Bleeding    HPI Patient presents with GI bleeding. History of AVMs and has had repeated transfusions. Recent admission for same. sees [facility]. Around 3 days ago she began to have black stool. Since then she's had some more fatigue. No nausea vomiting. No fevers. States   that this is similar to previous episodes. Most history obtained by family members and patient will not provide a lot of history. Baseline hemoglobin is around 10 and she usually gets transfused at around 8.  Past Medical History  Diagnosis Date  . Asthma   . CAD (coronary artery disease)     Pt reports MI in 2006 (no documentation).  Cardiolite in 05/2002 and 07/2006 did not reveal any reversible ischemia.  Pt follows with Dr. Rex Kras at [facility].  . CHF (congestive heart failure) (HCC)     EF 25-30% with dilated LV, mild LVH, severe hypokinesis, and mod-severe reduction in RV function  . Osteoporosis   . HYPERTENSION 08/03/2006  . GASTROPARESIS, DIABETIC 08/03/2006  . HYPERLIPIDEMIA 08/03/2006  . OBSTRUCTIVE SLEEP APNEA 01/06/2008  . PERIPHERAL NEUROPATHY 08/03/2006  . GERD 08/03/2006  . LOW BACK PAIN, CHRONIC 08/03/2006  . OSTEOPOROSIS 03/21/2009  . CEREBRAL EMBOLISM, WITH INFARCTION 07/02/2010  . Angina   . Myocardial infarction ([place]) "2 or 3"  . Pneumonia 02/26/12    "a few times; probably even today"  . DIABETES MELLITUS, TYPE II 11/04/1983  . Blood transfusion     multiple in 2012, 2015.   . GI bleed 08/2011 and 01/2014  . Chronic daily headache     migraines as well.   . Stroke [facility]) summer 2011    "made my left hip worse"  . Uterine cancer ([place])   . Nonischemic cardiomyopathy ([place]) 05/2010    Left heart catheterization:2011. Nonobstructive coronary artery disease.  . Pulmonary hypertension ([place])   . NSTEMI (non-ST elevated  myocardial infarction) ([place]) 01/2014    type II with CVA  . Acute embolic stroke ([place]) 0000000  . Non-ST elevation MI (NSTEMI) ([place]) 02/04/2014  . E. coli UTI 03/02/2014  . Renal failure, acute ([place]) 02/04/2014  . Iron deficiency anemia secondary to blood loss (chronic) 01/06/2008    Qualifier: Diagnosis of  By: Tomasa Hosteller MD, Veronique D.   . Nonsustained ventricular tachycardia ([place])   . Angiodysplasia of intestine with hemorrhage 05/13/2014  . CKD (chronic kidney disease) stage 4, GFR 15-29 ml/min (HCC) 08/21/2014   Past Surgical History  Procedure Laterality Date  . Esophagogastroduodenoscopy  08/26/2011    Procedure: ESOPHAGOGASTRODUODENOSCOPY (EGD);  Surgeon: [name], MD;  Location: [facility] ENDOSCOPY;  Service: Endoscopy;  Laterality: N/A;  . Colonoscopy  08/28/2011    Procedure: COLONOSCOPY;  Surgeon: [name], MD;  Location: [place];  Service: Endoscopy;  Laterality: N/A;  . Vaginal hysterectomy    . Tubal ligation    . Cataract extraction w/ intraocular lens  implant, bilateral    . Toe surgery      "right big toe; operated on it to straighten it out; it was under"  . Polysomnogram  10/17/2005    AHI-7.28/hr. AHI REM-20.8/hr. Average oxygen saturation range during REM and NREM was 97%. Lowest oxygen saturation during REM sleep was 90%.  . Carotid duplex  05/28/2010    No significant extracranial carotid  artery stenosis demonstrated. Vertebrals are patent w/ antegrade flow.  . Cardiac catheterization  05/24/2010    No intervention - recommed medical therapy.  . Cardiovascular stress test  08/07/2006    Moderate-severe defect seen in Basal inferior, Mid inferoseptal, Mid inferior, Mid inferolateral, and Apical inferior regions - consistent w/ infarct/scar. No scintigraphic evidence of inducible myocardial ischemia.  . Transthoracic echocardiogram  08/29/2011    EF 55-60%, moderate LVH,   . Esophagogastroduodenoscopy N/A 02/08/2014    Procedure: ESOPHAGOGASTRODUODENOSCOPY  (EGD);  Surgeon: [name], MD;  Location: [facility] ENDOSCOPY;  Service: Endoscopy;  Laterality: N/A;  . Colonoscopy N/A 05/12/2014    Procedure: COLONOSCOPY;  Surgeon: [name], MD;  Location: [place];  Service: Endoscopy;  Laterality: N/A;  . Enteroscopy N/A 05/13/2014    Procedure: ENTEROSCOPY;  Surgeon: [name], MD;  Location: [place];  Service: Endoscopy;  Laterality: N/A;  . Enteroscopy N/A 07/13/2014    Procedure: small bowel enteroscopy;  Surgeon: Jerene Bears, MD;  Location: [facility] ENDOSCOPY;  Service: Endoscopy;  Laterality: N/A;  requests slim colonoscope  . Enteroscopy N/A 01/21/2016    Procedure: ENTEROSCOPY;  Surgeon: Manus Gunning, MD;  Location: [place];  Service: Gastroenterology;  Laterality: N/A;   Family History  Problem Relation Age of Onset  . Diabetes insipidus Mother   . Hypertension Mother   . Hypertension Father   . Hypertension Sister   . Hypertension Child   . Stomach cancer Brother    Social History  Substance Use Topics  . Smoking status: Never Smoker   . Smokeless tobacco: Former Systems developer    Types: Snuff    Quit date: 10/13/2006     Comment: 02/26/12 "stopped snuff 4-6 years ago"  . Alcohol Use: No   OB History    No data available     Review of Systems  Constitutional: Positive for fatigue.  Respiratory: Positive for shortness of breath.   Cardiovascular: Negative for chest pain.  Gastrointestinal: Positive for blood in stool. Negative for nausea and abdominal pain.  Neurological: Negative for syncope.      Allergies  Baking soda-fluoride and Magnesium hydroxide  Home Medications   Prior to Admission medications   Medication Sig Start Date End Date Taking? Authorizing Provider  acetaminophen (TYLENOL) 500 MG tablet Take 500 mg by mouth every 6 (six) hours as needed for moderate pain or headache.   Yes Historical Provider, MD  albuterol (PROAIR HFA) 108 (90 BASE) MCG/ACT inhaler Inhale 2 puffs into the lungs every  6 (six) hours as needed for wheezing. 05/25/14  Yes Daniel J Angiulli, PA-C  amLODipine (NORVASC) 5 MG tablet Take 1 tablet by mouth daily. 12/24/15  Yes Historical Provider, MD  aspirin 81 MG tablet Take 81 mg by mouth daily.   Yes Historical Provider, MD  atorvastatin (LIPITOR) 40 MG tablet TAKE 1 TABLET (40 MG TOTAL) BY MOUTH DAILY AT 6 PM. 11/12/15  Yes Lorretta Harp, MD  doxycycline (VIBRAMYCIN) 100 MG capsule Take 1 capsule (100 mg total) by mouth 2 (two) times daily. 01/22/16  Yes Florencia Reasons, MD  guaiFENesin (MUCINEX) 600 MG 12 hr tablet Take 1 tablet (600 mg total) by mouth 2 (two) times daily. 01/22/16  Yes Florencia Reasons, MD  hydrALAZINE (APRESOLINE) 25 MG tablet Take 1 tablet (25 mg total) by mouth every 8 (eight) hours. 12/24/15  Yes Shanker Kristeen Mans, MD  insulin aspart (NOVOLOG FLEXPEN) 100 UNIT/ML FlexPen Inject 2-5 Units into the skin 3 (three) times  daily with meals.   Yes Historical Provider, MD  insulin glargine (LANTUS) 100 UNIT/ML injection Inject 0.07 mLs (7 Units total) into the skin at bedtime. Patient taking differently: Inject 14 Units into the skin 2 (two) times daily.  11/07/15  Yes Geradine Girt, DO  iron polysaccharides (NIFEREX) 150 MG capsule Take 1 capsule (150 mg total) by mouth every other day. 01/22/16  Yes Florencia Reasons, MD  isosorbide mononitrate (IMDUR) 60 MG 24 hr tablet Take 1 tablet (60 mg total) by mouth daily. 05/25/14  Yes Daniel J Angiulli, PA-C  loratadine (ALLERGY RELIEF) 10 MG tablet Take 10 mg by mouth daily. Reported on 12/07/2015   Yes Historical Provider, MD  magnesium oxide (MAG-OX) 400 (241.3 MG) MG tablet Take 1 tablet (400 mg total) by mouth 2 (two) times daily. 05/25/14  Yes Daniel J Angiulli, PA-C  metolazone (ZAROXOLYN) 2.5 MG tablet 1 TABLET AS NEEDED WHEN WEIGHT IS OVER 152 LBS 01/22/16  Yes Florencia Reasons, MD  metoprolol succinate (TOPROL-XL) 25 MG 24 hr tablet Take 1 tablet by mouth daily. 12/24/15  Yes Historical Provider, MD  omeprazole (PRILOSEC) 20 MG capsule Take  1 capsule (20 mg total) by mouth 2 (two) times daily before a meal. 01/12/16  Yes Allie Bossier, MD  polyethylene glycol [facility] / Floria Raveling) packet Take 17 g by mouth daily as needed for mild constipation.    Yes Historical Provider, MD  potassium chloride SA (KLOR-CON M20) 20 MEQ tablet TAKE 1 TABLET DAILY AND TAKE AN EXTRA TABLET WHEN YOU HAVE TO TAKE AN EXTRA METOLAZONE FOR WT OVER 152 LBS 01/11/16  Yes Eileen Stanford, PA-C  torsemide (DEMADEX) 20 MG tablet Take 2 tablets (40 mg total) by mouth daily. 12/24/15  Yes Shanker Kristeen Mans, MD   BP 131/46 mmHg  Pulse 58  Temp(Src) 97.6 F (36.4 C) (Axillary)  Resp 19  SpO2 100%  LMP 12/19/1968 Physical Exam  Constitutional: She appears well-developed.  HENT:  Head: Atraumatic.  Neck: Neck supple.  Cardiovascular: Normal rate.   Pulmonary/Chest: Effort normal.  Abdominal: Soft. There is no tenderness.  Musculoskeletal: Normal range of motion.  Neurological: She is alert.  Patient is alert and commands but will speak to me minimally.  Skin: There is pallor.    ED Course  Procedures (including critical care time) Labs Review Labs Reviewed  COMPREHENSIVE METABOLIC PANEL - Abnormal; Notable for the following:    Sodium 134 (*)    Chloride 100 (*)    CO2 17 (*)    Glucose, Bld 318 (*)    BUN 84 (*)    Creatinine, Ser 3.13 (*)    Albumin 3.1 (*)    Alkaline Phosphatase 145 (*)    GFR calc non Af Amer 13 (*)    GFR calc Af Amer 15 (*)    Anion gap 17 (*)    All other components within normal limits  PROTIME-INR - Abnormal; Notable for the following:    Prothrombin Time 17.0 (*)    All other components within normal limits  CBC WITH DIFFERENTIAL/PLATELET - Abnormal; Notable for the following:    RBC 2.20 (*)    Hemoglobin 5.8 (*)    HCT 18.7 (*)    RDW 16.5 (*)    All other components within normal limits  GLUCOSE, CAPILLARY - Abnormal; Notable for the following:    Glucose-Capillary 281 (*)    All other components within  normal limits  TYPE AND SCREEN    Imaging Review  Dg Chest Port 1 View  02/02/2016  CLINICAL DATA:  PICC line placement. EXAM: PORTABLE CHEST 1 VIEW COMPARISON:  01/15/2010 FINDINGS: A new right arm PICC line is seen with tip overlying the right atrium, approximately 3 cm below the expected level of the superior cavoatrial junction. Moderate cardiomegaly is stable.  Both lungs remain clear. IMPRESSION: PICC line tip overlies right atrium, approximately 3 cm below expected level of superior cavoatrial junction. Stable cardiomegaly.  No active lung disease. Electronically Signed   By: Earle Gell M.D.   On: 02/02/2016 15:10   I have personally reviewed and evaluated these images and lab results as part of my medical decision-making.   EKG Interpretation None      MDM   Final diagnoses:  Acute blood loss anemia  Anemia due to acute blood loss  Chronic GI bleeding    Patient with GI bleed and anemia. History of same. Decreased mental status. D/w Gi, who was very familiar with the patient. Will transfuse and admit    Davonna Belling, MD 02/02/16 1622

## 2016-02-02 NOTE — Progress Notes (Signed)
NURSING PROGRESS NOTE  LEANZA HUTCHCRAFT [id number] Admission Data: 02/02/2016 4:00 PM Attending Provider: Kelvin Cellar, MD [id number] Elyse Hsu, MD Code Status: Full Code  [name] is a 82 y.o. female patient admitted from ED:  -No acute distress noted.  -No complaints of shortness of breath.  -No complaints of chest pain.   Cardiac Monitoring: Box # 12 in place. Cardiac monitor yields:sinus bradycardia.  Blood pressure 131/46, pulse 58, temperature 97.6 F (36.4 C), temperature source Axillary, resp. rate 19, last menstrual period 12/19/1968, SpO2 100 %.   IV Fluids:  IV in place, occlusive dsg intact without redness, IV cath PICC line right upper arm none.   Allergies:  Baking soda-fluoride and Magnesium hydroxide  Past Medical History:   has a past medical history of Asthma; CAD (coronary artery disease); CHF (congestive heart failure) ([place]); Osteoporosis; HYPERTENSION (08/03/2006); GASTROPARESIS, DIABETIC (08/03/2006); HYPERLIPIDEMIA (08/03/2006); OBSTRUCTIVE SLEEP APNEA (01/06/2008); PERIPHERAL NEUROPATHY (08/03/2006); GERD (08/03/2006); LOW BACK PAIN, CHRONIC (08/03/2006); OSTEOPOROSIS (03/21/2009); CEREBRAL EMBOLISM, WITH INFARCTION (07/02/2010); Angina; Myocardial infarction ([place]) ("2 or 3"); Pneumonia (02/26/12); DIABETES MELLITUS, TYPE II (11/04/1983); Blood transfusion; GI bleed (08/2011 and 01/2014); Chronic daily headache; Stroke [facility]) (summer 2011); Uterine cancer ([place]); Nonischemic cardiomyopathy ([place]) (05/2010); Pulmonary hypertension ([place]); NSTEMI (non-ST elevated myocardial infarction) ([place]) (01/2014); Acute embolic stroke ([place]) (0000000); Non-ST elevation MI (NSTEMI) ([place]) (02/04/2014); E. coli UTI (03/02/2014); Renal failure, acute ([place]) (02/04/2014); Iron deficiency anemia secondary to blood loss (chronic) (01/06/2008); Nonsustained ventricular tachycardia ([place]); Angiodysplasia of intestine with hemorrhage (05/13/2014); and CKD (chronic kidney disease) stage 4, GFR 15-29 ml/min  (HCC) (08/21/2014).  Past Surgical History:   has past surgical history that includes Esophagogastroduodenoscopy (08/26/2011); Colonoscopy (08/28/2011); Vaginal hysterectomy; Tubal ligation; Cataract extraction w/ intraocular lens  implant, bilateral; Toe Surgery; POLYSOMNOGRAM (10/17/2005); CAROTID DUPLEX (05/28/2010); Cardiac catheterization (05/24/2010); Cardiovascular stress test (08/07/2006); transthoracic echocardiogram (08/29/2011); Esophagogastroduodenoscopy (N/A, 02/08/2014); Colonoscopy (N/A, 05/12/2014); enteroscopy (N/A, 05/13/2014); enteroscopy (N/A, 07/13/2014); and enteroscopy (N/A, 01/21/2016).  Social History:   reports that she has never smoked. She quit smokeless tobacco use about 9 years ago. Her smokeless tobacco use included Snuff. She reports that she does not drink alcohol or use illicit drugs.  Skin: Intact  Patient/Family orientated to room. Information packet given to patient/family. Admission inpatient armband information verified with patient/family to include name and date of birth and placed on patient arm. Side rails up x 2, fall assessment and education completed with patient/family. Patient/family able to verbalize understanding of risk associated with falls and verbalized understanding to call for assistance before getting out of bed. Call light within reach. Patient/family able to voice and demonstrate understanding of unit orientation instructions.    Will continue to evaluate and treat per MD orders.

## 2016-02-03 ENCOUNTER — Other Ambulatory Visit: Payer: Self-pay | Admitting: Internal Medicine

## 2016-02-03 DIAGNOSIS — D62 Acute posthemorrhagic anemia: Secondary | ICD-10-CM

## 2016-02-03 DIAGNOSIS — E119 Type 2 diabetes mellitus without complications: Secondary | ICD-10-CM

## 2016-02-03 DIAGNOSIS — K5521 Angiodysplasia of colon with hemorrhage: Secondary | ICD-10-CM

## 2016-02-03 DIAGNOSIS — Z794 Long term (current) use of insulin: Secondary | ICD-10-CM

## 2016-02-03 DIAGNOSIS — K922 Gastrointestinal hemorrhage, unspecified: Secondary | ICD-10-CM

## 2016-02-03 LAB — CBC
HCT: 25.6 % — ABNORMAL LOW (ref 36.0–46.0)
HEMOGLOBIN: 8.4 g/dL — AB (ref 12.0–15.0)
MCH: 27 pg (ref 26.0–34.0)
MCHC: 32.8 g/dL (ref 30.0–36.0)
MCV: 82.3 fL (ref 78.0–100.0)
Platelets: 257 10*3/uL (ref 150–400)
RBC: 3.11 MIL/uL — ABNORMAL LOW (ref 3.87–5.11)
RDW: 15.8 % — AB (ref 11.5–15.5)
WBC: 6.7 10*3/uL (ref 4.0–10.5)

## 2016-02-03 LAB — GLUCOSE, CAPILLARY
GLUCOSE-CAPILLARY: 121 mg/dL — AB (ref 65–99)
GLUCOSE-CAPILLARY: 139 mg/dL — AB (ref 65–99)
GLUCOSE-CAPILLARY: 156 mg/dL — AB (ref 65–99)
GLUCOSE-CAPILLARY: 70 mg/dL (ref 65–99)
Glucose-Capillary: 116 mg/dL — ABNORMAL HIGH (ref 65–99)

## 2016-02-03 LAB — BASIC METABOLIC PANEL
ANION GAP: 12 (ref 5–15)
BUN: 87 mg/dL — AB (ref 6–20)
CALCIUM: 9.2 mg/dL (ref 8.9–10.3)
CO2: 24 mmol/L (ref 22–32)
Chloride: 101 mmol/L (ref 101–111)
Creatinine, Ser: 3.06 mg/dL — ABNORMAL HIGH (ref 0.44–1.00)
GFR calc Af Amer: 16 mL/min — ABNORMAL LOW (ref >60)
GFR, EST NON AFRICAN AMERICAN: 13 mL/min — AB (ref >60)
GLUCOSE: 82 mg/dL (ref 65–99)
Potassium: 4.5 mmol/L (ref 3.5–5.1)
SODIUM: 137 mmol/L (ref 135–145)

## 2016-02-03 MED ORDER — OMEPRAZOLE 40 MG PO CPDR: 40.0000 mg | DELAYED_RELEASE_CAPSULE | Freq: Two times a day (BID) | ORAL | Status: AC

## 2016-02-03 MED ORDER — POLYSACCHARIDE IRON COMPLEX 150 MG PO CAPS: 150.0000 mg | ORAL_CAPSULE | Freq: Two times a day (BID) | ORAL | Status: DC

## 2016-02-03 MED ORDER — TORSEMIDE 20 MG PO TABS: 40.0000 mg | ORAL_TABLET | Freq: Every day | ORAL | Status: AC

## 2016-02-03 NOTE — Discharge Summary (Addendum)
PATIENT DETAILS Name: [name] Age: 82 y.o. Sex: female Date of Birth: Apr 14, 1933 MRN: [id number]. Admitting Physician: Kelvin Cellar, MD [id number] Elyse Hsu, MD  Admit Date: 02/02/2016 Discharge date: 02/03/2016  Recommendations for Outpatient Follow-up:  1. Aspirin discontinued-due to upper GI bleeding. 2. May need to involve palliative in the near future 3. Resume Demadex from 4/24 4. Ensure follow-up with cardiology, gastroenterology and nephrology 5. Will need close monitoring of her CBC as an outpatient.  PRIMARY DISCHARGE DIAGNOSIS:  Active Problems:   DM (diabetes mellitus), type 2, uncontrolled (HCC)   Essential hypertension   GI bleed   GIB (gastrointestinal bleeding)   Blood loss anemia   CKD (chronic kidney disease)   Insulin dependent diabetes mellitus ([place])      PAST MEDICAL HISTORY: Past Medical History  Diagnosis Date  . Asthma   . CAD (coronary artery disease)     Pt reports MI in 2006 (no documentation).  Cardiolite in 05/2002 and 07/2006 did not reveal any reversible ischemia.  Pt follows with Dr. Rex Kras at [facility]  . CHF (congestive heart failure) (HCC)     EF 25-30% with dilated LV, mild LVH, severe hypokinesis, and mod-severe reduction in RV function  . Osteoporosis   . HYPERTENSION 08/03/2006  . GASTROPARESIS, DIABETIC 08/03/2006  . HYPERLIPIDEMIA 08/03/2006  . OBSTRUCTIVE SLEEP APNEA 01/06/2008  . PERIPHERAL NEUROPATHY 08/03/2006  . GERD 08/03/2006  . LOW BACK PAIN, CHRONIC 08/03/2006  . OSTEOPOROSIS 03/21/2009  . CEREBRAL EMBOLISM, WITH INFARCTION 07/02/2010  . Angina   . Myocardial infarction ([place]) "2 or 3"  . Pneumonia 02/26/12    "a few times; probably even today"  . DIABETES MELLITUS, TYPE II 11/04/1983  . Blood transfusion     multiple in 2012, 2015.   . GI bleed 08/2011 and 01/2014  . Chronic daily headache     migraines as well.   . Stroke [facility]) summer 2011    "made my left hip worse"  . Uterine cancer ([place])   . Nonischemic  cardiomyopathy ([place]) 05/2010    Left heart catheterization:2011. Nonobstructive coronary artery disease.  . Pulmonary hypertension ([place])   . NSTEMI (non-ST elevated myocardial infarction) ([place]) 01/2014    type II with CVA  . Acute embolic stroke ([place]) 0000000  . Non-ST elevation MI (NSTEMI) ([place]) 02/04/2014  . E. coli UTI 03/02/2014  . Renal failure, acute ([place]) 02/04/2014  . Iron deficiency anemia secondary to blood loss (chronic) 01/06/2008    Qualifier: Diagnosis of  By: Tomasa Hosteller MD, Veronique D.   . Nonsustained ventricular tachycardia ([place])   . Angiodysplasia of intestine with hemorrhage 05/13/2014  . CKD (chronic kidney disease) stage 4, GFR 15-29 ml/min (HCC) 08/21/2014    DISCHARGE MEDICATIONS: Current Discharge Medication List    CONTINUE these medications which have CHANGED   Details  iron polysaccharides (NIFEREX) 150 MG capsule Take 1 capsule (150 mg total) by mouth 2 (two) times daily. Qty: 60 capsule, Refills: 0    omeprazole (PRILOSEC) 40 MG capsule Take 1 capsule (40 mg total) by mouth 2 (two) times daily before a meal. Qty: 60 capsule, Refills: 0   Associated Diagnoses: Erosive gastritis with hemorrhage    torsemide (DEMADEX) 20 MG tablet Take 2 tablets (40 mg total) by mouth daily. Qty: 60 tablet, Refills: 0      CONTINUE these medications which have NOT CHANGED   Details  acetaminophen (TYLENOL) 500 MG tablet Take 500 mg by mouth every 6 (six) hours as needed for moderate  pain or headache.    albuterol (PROAIR HFA) 108 (90 BASE) MCG/ACT inhaler Inhale 2 puffs into the lungs every 6 (six) hours as needed for wheezing. Qty: 8.5 g, Refills: 11    amLODipine (NORVASC) 5 MG tablet Take 1 tablet by mouth daily.    atorvastatin (LIPITOR) 40 MG tablet TAKE 1 TABLET (40 MG TOTAL) BY MOUTH DAILY AT 6 PM. Qty: 30 tablet, Refills: 10    guaiFENesin (MUCINEX) 600 MG 12 hr tablet Take 1 tablet (600 mg total) by mouth 2 (two) times daily. Qty: 30 tablet, Refills: 0      hydrALAZINE (APRESOLINE) 25 MG tablet Take 1 tablet (25 mg total) by mouth every 8 (eight) hours. Qty: 90 tablet, Refills: 0    insulin aspart (NOVOLOG FLEXPEN) 100 UNIT/ML FlexPen Inject 2-5 Units into the skin 3 (three) times daily with meals.    insulin glargine (LANTUS) 100 UNIT/ML injection Inject 0.07 mLs (7 Units total) into the skin at bedtime. Qty: 10 mL, Refills: 11    isosorbide mononitrate (IMDUR) 60 MG 24 hr tablet Take 1 tablet (60 mg total) by mouth daily. Qty: 30 tablet, Refills: 2    loratadine (ALLERGY RELIEF) 10 MG tablet Take 10 mg by mouth daily. Reported on 12/07/2015    magnesium oxide (MAG-OX) 400 (241.3 MG) MG tablet Take 1 tablet (400 mg total) by mouth 2 (two) times daily. Qty: 60 tablet, Refills: 1    metolazone (ZAROXOLYN) 2.5 MG tablet 1 TABLET AS NEEDED WHEN WEIGHT IS OVER 152 LBS Qty: 30 tablet, Refills: 0    metoprolol succinate (TOPROL-XL) 25 MG 24 hr tablet Take 1 tablet by mouth daily.    polyethylene glycol (MIRALAX / GLYCOLAX) packet Take 17 g by mouth daily as needed for mild constipation.     potassium chloride SA (KLOR-CON M20) 20 MEQ tablet TAKE 1 TABLET DAILY AND TAKE AN EXTRA TABLET WHEN YOU HAVE TO TAKE AN EXTRA METOLAZONE FOR WT OVER 152 LBS Qty: 45 tablet, Refills: 3      STOP taking these medications     aspirin 81 MG tablet      doxycycline (VIBRAMYCIN) 100 MG capsule         ALLERGIES:   Allergies  Allergen Reactions  . Baking Soda-Fluoride [Sodium Fluoride] Nausea And Vomiting  . Magnesium Hydroxide Nausea And Vomiting    BRIEF HPI:  See H&P, Labs, Consult and Test reports for all details in brief, patient is a 82 y.o. female with a past medical history of coronary artery disease, history of CVA, congestive heart failure with EF of 20-25%, insulin dependent diabetes mellitus, as well as having history of GI bleed secondary to arteriovenous malformation recently admitted for PRBC transfusion when she was found to have a  hemoglobin of 5.8 as an outpatient.  CONSULTATIONS:   GI  PERTINENT RADIOLOGIC STUDIES: X-ray Chest Pa And Lateral  01/16/2016  CLINICAL DATA:  History of rectal bleeding, congestive heart failure, diabetes, chronic renal disease EXAM: CHEST  2 VIEW COMPARISON:  Chest x-ray of 01/12/2016 FINDINGS: No pneumonia is seen. There is moderate pulmonary vascular congestion however with moderate cardiomegaly. Tiny pleural effusions cannot be excluded. The thoracic vertebrae are normal alignment. IMPRESSION: Cardiomegaly mild pulmonary vascular congestion. Electronically Signed   By: Ivar Drape M.D.   On: 01/16/2016 17:20   Dg Chest 2 View  01/12/2016  CLINICAL DATA:  Pulmonary edema EXAM: CHEST  2 VIEW COMPARISON:  12/20/2015 FINDINGS: The heart is moderately enlarged. Mild diffuse pulmonary edema.  Tiny pleural effusions. No pneumothorax. IMPRESSION: Mild CHF.  Tiny pleural effusions. Electronically Signed   By: Marybelle Killings M.D.   On: 01/12/2016 11:45   Dg Chest Port 1 View  02/02/2016  CLINICAL DATA:  PICC line placement. EXAM: PORTABLE CHEST 1 VIEW COMPARISON:  01/15/2010 FINDINGS: A new right arm PICC line is seen with tip overlying the right atrium, approximately 3 cm below the expected level of the superior cavoatrial junction. Moderate cardiomegaly is stable.  Both lungs remain clear. IMPRESSION: PICC line tip overlies right atrium, approximately 3 cm below expected level of superior cavoatrial junction. Stable cardiomegaly.  No active lung disease. Electronically Signed   By: Earle Gell M.D.   On: 02/02/2016 15:10     PERTINENT LAB RESULTS: CBC:  Recent Labs  02/02/16 1130 02/03/16 0453  WBC 5.4 6.7  HGB 5.8* 8.4*  HCT 18.7* 25.6*  PLT 287 257   CMET CMP     Component Value Date/Time   NA 137 02/03/2016 0453   NA 140 07/26/2015 0758   K 4.5 02/03/2016 0453   K 3.9 07/26/2015 0758   CL 101 02/03/2016 0453   CO2 24 02/03/2016 0453   CO2 26 07/26/2015 0758   GLUCOSE 82 02/03/2016  0453   GLUCOSE 172* 07/26/2015 0758   BUN 87* 02/03/2016 0453   BUN 31.4* 07/26/2015 0758   CREATININE 3.06* 02/03/2016 0453   CREATININE 2.21* 01/11/2016 1525   CREATININE 2.0* 07/26/2015 0758   CALCIUM 9.2 02/03/2016 0453   CALCIUM 9.2 07/26/2015 0758   PROT 6.5 02/02/2016 1130   PROT 6.5 07/26/2015 0758   ALBUMIN 3.1* 02/02/2016 1130   ALBUMIN 3.0* 07/26/2015 0758   AST 23 02/02/2016 1130   AST 23 07/26/2015 0758   ALT 20 02/02/2016 1130   ALT 34 07/26/2015 0758   ALKPHOS 145* 02/02/2016 1130   ALKPHOS 201* 07/26/2015 0758   BILITOT 1.0 02/02/2016 1130   BILITOT 0.38 07/26/2015 0758   GFRNONAA 13* 02/03/2016 0453   GFRNONAA 39* 09/03/2011 1404   GFRAA 16* 02/03/2016 0453   GFRAA 45* 09/03/2011 1404    GFR Estimated Creatinine Clearance: 12.8 mL/min (by C-G formula based on Cr of 3.06). No results for input(s): LIPASE, AMYLASE in the last 72 hours. No results for input(s): CKTOTAL, CKMB, CKMBINDEX, TROPONINI in the last 72 hours. Invalid input(s): POCBNP No results for input(s): DDIMER in the last 72 hours. No results for input(s): HGBA1C in the last 72 hours. No results for input(s): CHOL, HDL, LDLCALC, TRIG, CHOLHDL, LDLDIRECT in the last 72 hours. No results for input(s): TSH, T4TOTAL, T3FREE, THYROIDAB in the last 72 hours.  Invalid input(s): FREET3 No results for input(s): VITAMINB12, FOLATE, FERRITIN, TIBC, IRON, RETICCTPCT in the last 72 hours. Coags:  Recent Labs  02/02/16 1130  INR 1.37   Microbiology: No results found for this or any previous visit (from the past 240 hour(s)).   BRIEF HOSPITAL COURSE:  Sub-acute blood loss anemia: Secondary to recurrent GI bleeding from AVMs. Transfused 2 units of PRBC on admission, hemoglobin improved to 8.4. Having mostly brown-colored stools now. Stable for discharge with close monitoring of CBC as an outpatient-GI will arrange for outpatient CBC sometime next week. Patient has a follow up appt with renal on 4/26-have  asked family that they ask that a CBC be checked along with BMET.   Active Problems: Upper GI bleed:has hx of recurrent UGI bleed from AVM's. Seen by GI-since just had undergone EGD on 01/21/16-did not think she  required a repeat EGD. No melena this am-stools are now mostly brown. Continue PPI on discharge. Will discontinue ASA.   Acute on chronic kidney disease stage IV: ARF secondary to prerenal azotemia in the setting of above. Creatinine now down trending, we'll continue to hold Demadex-suspect will be resumed in a day or so. She has a follow-up appointment with nephrology on 4/26-family knows that she needs repeat electrolyte panel.  Severe MR, TR and moderate PR: Previously not felt to be a surgical candidate. Plan is to manage conservatively  Severe pulmonary hypertension: Seen on echo-PA pressure around 62 mmHg. Likely secondary to severe valvular abnormalities, chronic systolic heart failure. Managed conservatively.  Type 2 diabetes: CBG stable, continue usual dosing of Lantus  Dyslipidemia: Continue Lipitor  GERD: Continue PPI  TODAY-DAY OF DISCHARGE:  Subjective:   Tae Pitcairn today has no headache,no chest abdominal pain,no new weakness tingling or numbness, feels much better wants to go home today.   Objective:   Blood pressure 139/60, pulse 61, temperature 97.5 F (36.4 C), temperature source Oral, resp. rate 16, weight 63.3 kg (139 lb 8.8 oz), last menstrual period 12/19/1968, SpO2 100 %.  Intake/Output Summary (Last 24 hours) at 02/03/16 1127 Last data filed at 02/03/16 0954  Gross per 24 hour  Intake   1090 ml  Output      0 ml  Net   1090 ml   Filed Weights   02/03/16 0700  Weight: 63.3 kg (139 lb 8.8 oz)    Exam Awake Alert, Oriented *3, No new F.N deficits, Normal affect [place].AT,PERRAL Supple Neck,No JVD, No cervical lymphadenopathy appriciated.  Symmetrical Chest wall movement, Good air movement bilaterally, CTAB RRR,No Gallops,Rubs or new Murmurs, No  Parasternal Heave +ve B.Sounds, Abd Soft, Non tender, No organomegaly appriciated, No rebound -guarding or rigidity. No Cyanosis, Clubbing or edema, No new Rash or bruise  DISCHARGE CONDITION: Stable  DISPOSITION: Home  DISCHARGE INSTRUCTIONS:    Activity:  As tolerated with Full fall precautions use walker/cane & assistance as needed  Get Medicines reviewed and adjusted: Please take all your medications with you for your next visit with your Primary MD  Please request your Primary MD to go over all hospital tests and procedure/radiological results at the follow up, please ask your Primary MD to get all Hospital records sent to his/her office.  If you experience worsening of your admission symptoms, develop shortness of breath, life threatening emergency, suicidal or homicidal thoughts you must seek medical attention immediately by calling 911 or calling your MD immediately  if symptoms less severe.  You must read complete instructions/literature along with all the possible adverse reactions/side effects for all the Medicines you take and that have been prescribed to you. Take any new Medicines after you have completely understood and accpet all the possible adverse reactions/side effects.   Do not drive when taking Pain medications.   Do not take more than prescribed Pain, Sleep and Anxiety Medications  Special Instructions: If you have smoked or chewed Tobacco  in the last 2 yrs please stop smoking, stop any regular Alcohol  and or any Recreational drug use.  Wear Seat belts while driving.  Please note  You were cared for by a hospitalist during your hospital stay. Once you are discharged, your primary care physician will handle any further medical issues. Please note that NO REFILLS for any discharge medications will be authorized once you are discharged, as it is imperative that you return to your primary care physician (or  establish a relationship with a primary care physician  if you do not have one) for your aftercare needs so that they can reassess your need for medications and monitor your lab values.   Diet recommendation: Diabetic Diet Heart Healthy diet  Discharge Instructions    (HEART FAILURE PATIENTS) Call MD:  Anytime you have any of the following symptoms: 1) 3 pound weight gain in 24 hours or 5 pounds in 1 week 2) shortness of breath, with or without a dry hacking cough 3) swelling in the hands, feet or stomach 4) if you have to sleep on extra pillows at night in order to breathe.    Complete by:  As directed      Call MD for:  difficulty breathing, headache or visual disturbances    Complete by:  As directed      Diet - low sodium heart healthy    Complete by:  As directed      Increase activity slowly    Complete by:  As directed            Follow-up Information    Follow up with [name], MD. Schedule an appointment as soon as possible for a visit in 1 week.   Specialty:  Family Medicine   Why:  Hospital follow up   Contact information:   [address] [place] [address] [phone]       Follow up with [facility] On 02/06/2016.   Why:  keep appointment-ask for renal function and CBC to be checked   Contact information:   [place] [address] [phone]       Follow up with Silvano Rusk, MD On 02/13/2016.   Specialty:  Gastroenterology   Why:  Go to basement lab and have blood count checked   Contact information:   [address] [address] [phone]      Total Time spent on discharge equals 25 minutes.  SignedOren Binet 02/03/2016 11:27 AM

## 2016-02-03 NOTE — Progress Notes (Signed)
Reviewed discharge paperwork with pt's daughter, prescriptions given.  Paged IV team to have PICC line removed.  Pt and family denied any needs at this time.  Will continue to monitor.

## 2016-02-03 NOTE — Progress Notes (Signed)
     [facility] Progress Note Assessment / Plan: #86 82 year old African-American female with history of previous GI bleeds and recurrent anemia felt secondary to AVMs. She has undergone enteroscopy with ablation of proximal jejunal AVM 2, but most recent enteroscopy earlier this month was negative for such. Suspect bleeding secondary to AVMs maybe deeper in small bowel. Returns for outpatient Hgb of 6 grams earlier this week (was never contacted about it) and continues to have black stools intermittently.   #2 anemia normocytic secondary to acute blood loss/superimposed on chronic iron deficiency anemia:  Hgb improved to 8.4 grams this morning s/p 2 units PRBC's.  *Management strategy may just be close monitoring of Hgb and periodic transfusions, which was the plan at last discharge earlier this month. Hopefully in the future she can be transfused prn as an outpatient and can avoid future ED visits and admissions. No plans for procedures currently. Family seems in agreement with this plan. *Monitor Hgb.  Transfuse to Hgb between 8-9 grams.   LOS: 1 day   ZEHR, JESSICA D.  02/03/2016, 9:12 AM  Pager number [phone]    [facility] Attending   I have taken an interval history, reviewed the chart and examined the patient. I agree with the Advanced Practitioner's note, impression and recommendations.    She will see renal 4/26 and then I will have her do Hgb 5/3  Will see if we can coordinate better f/u Hgb and transfusionetc  [name], MD, [facility] [phone] (pager) [phone] after 5 PM, weekends and holidays  02/03/2016 11:22 AM    Subjective:  Looks and feels better today.  Ate some breakfast.  Had a dark brown BM this AM.  Objective:  Vital signs in last 24 hours: Temp:  [97.3 F (36.3 C)-98.3 F (36.8 C)] 97.5 F (36.4 C) (04/23 0509) Pulse Rate:  [52-61] 61 (04/23 0509) Resp:  [14-29] 16 (04/23 0509) BP:  (106-146)/(41-60) 112/54 mmHg (04/23 0509) SpO2:  [96 %-100 %] 100 % (04/23 0509) FiO2 (%):  [28 %] 28 % (04/22 1541) Weight:  [139 lb 8.8 oz (63.3 kg)] 139 lb 8.8 oz (63.3 kg) (04/23 0700) Last BM Date: 02/02/16 General:  Alert, Well-developed, in NAD; much more alert and interactive today. Heart:  Regular rate and rhythm; SEM noted. Pulm:  CTAB.  No W/R/R. Abdomen:  Soft, non-distended.  BS present.  Non-tender. Extremities:  Without edema. Neurologic:  Alert and oriented x 4;  grossly normal neurologically. Psych:  Alert and cooperative. Normal mood and affect.  Lab Results:  Recent Labs  02/02/16 1130 02/03/16 0453  WBC 5.4 6.7  HGB 5.8* 8.4*  HCT 18.7* 25.6*  PLT 287 257

## 2016-02-04 ENCOUNTER — Other Ambulatory Visit: Payer: Self-pay | Admitting: *Deleted

## 2016-02-04 ENCOUNTER — Encounter: Payer: Self-pay | Admitting: *Deleted

## 2016-02-04 NOTE — Patient Outreach (Signed)
[facility]) Care Management   02/04/2016  [name] [birth year] [id number]  [name] is an 82 y.o. female  Subjective:   Member states she is "alright."  Currently lying in bed, denies any pain or discomfort, but does complain of some generalized weakness, which she state is better.  Son states she was admitted to the hospital again on Saturday for observation, where she received a blood transfusion and was discharged yesterday.  He voices some disappointment with the PCP office because the member had labs done on Tuesday and he never received a call stating that her blood levels had decreased.  He state he called the office on Saturday to report that the member was having dark stools with an odor again, and that she was experiencing weakness.  He requests help finding a new physician, stating that the current one is "just too busy."  Objective:   Review of Systems  Constitutional: Negative.   Eyes: Negative.   Respiratory: Positive for cough and sputum production.   Cardiovascular: Negative.   Gastrointestinal: Negative.   Genitourinary: Negative.   Musculoskeletal: Negative.   Skin: Negative.   Neurological: Negative.   Endo/Heme/Allergies: Negative.   Psychiatric/Behavioral: Positive for memory loss.    Physical Exam  Constitutional: She appears well-developed and well-nourished.  Neck: Normal range of motion.  Cardiovascular: Normal rate, regular rhythm and normal heart sounds.   Respiratory: Effort normal and breath sounds normal.  GI: Soft. Bowel sounds are normal.  Musculoskeletal: Normal range of motion.  Neurological: She is alert.  Skin: Skin is warm and dry.    BP 98/46 mmHg  Pulse 61  Resp 18  Wt 139 lb (63.05 kg)  SpO2 98%  LMP 12/19/1968   Encounter Medications:   Outpatient Encounter Prescriptions as of 02/04/2016  Medication Sig Note  . acetaminophen (TYLENOL) 500 MG tablet Take 500 mg by mouth every 6 (six) hours as needed  for moderate pain or headache.   . albuterol (PROAIR HFA) 108 (90 BASE) MCG/ACT inhaler Inhale 2 puffs into the lungs every 6 (six) hours as needed for wheezing.   Marland Kitchen amLODipine (NORVASC) 5 MG tablet Take 1 tablet by mouth daily.   Marland Kitchen atorvastatin (LIPITOR) 40 MG tablet TAKE 1 TABLET (40 MG TOTAL) BY MOUTH DAILY AT 6 PM.   . guaiFENesin (MUCINEX) 600 MG 12 hr tablet Take 1 tablet (600 mg total) by mouth 2 (two) times daily.   . hydrALAZINE (APRESOLINE) 25 MG tablet Take 1 tablet (25 mg total) by mouth every 8 (eight) hours.   . insulin aspart (NOVOLOG FLEXPEN) 100 UNIT/ML FlexPen Inject 2-5 Units into the skin 3 (three) times daily with meals.   . insulin glargine (LANTUS) 100 UNIT/ML injection Inject 0.07 mLs (7 Units total) into the skin at bedtime. (Patient taking differently: Inject 14 Units into the skin 2 (two) times daily. )   . isosorbide mononitrate (IMDUR) 60 MG 24 hr tablet Take 1 tablet (60 mg total) by mouth daily.   Marland Kitchen loratadine (ALLERGY RELIEF) 10 MG tablet Take 10 mg by mouth daily. Reported on 12/07/2015   . magnesium oxide (MAG-OX) 400 (241.3 MG) MG tablet Take 1 tablet (400 mg total) by mouth 2 (two) times daily.   . metolazone (ZAROXOLYN) 2.5 MG tablet 1 TABLET AS NEEDED WHEN WEIGHT IS OVER 152 LBS   . metoprolol succinate (TOPROL-XL) 25 MG 24 hr tablet Take 1 tablet by mouth daily.   Marland Kitchen omeprazole (PRILOSEC) 40 MG capsule Take  1 capsule (40 mg total) by mouth 2 (two) times daily before a meal.   . polyethylene glycol (MIRALAX / GLYCOLAX) packet Take 17 g by mouth daily as needed for mild constipation.    . potassium chloride SA (KLOR-CON M20) 20 MEQ tablet TAKE 1 TABLET DAILY AND TAKE AN EXTRA TABLET WHEN YOU HAVE TO TAKE AN EXTRA METOLAZONE FOR WT OVER 152 LBS   . torsemide (DEMADEX) 20 MG tablet Take 2 tablets (40 mg total) by mouth daily.   . iron polysaccharides (NIFEREX) 150 MG capsule Take 1 capsule (150 mg total) by mouth 2 (two) times daily. (Patient not taking: Reported on  02/04/2016) 02/04/2016: Will get OTC iron supplement today    No facility-administered encounter medications on file as of 02/04/2016.    Functional Status:   In your present state of health, do you have any difficulty performing the following activities: 02/04/2016 02/02/2016  Hearing? Tempie Donning  Vision? N N  Difficulty concentrating or making decisions? Tempie Donning  Walking or climbing stairs? Y Y  Dressing or bathing? Y Y  Doing errands, shopping? Tempie Donning  Preparing Food and eating ? Y -  Using the Toilet? N -  In the past six months, have you accidently leaked urine? Y -  Do you have problems with loss of bowel control? N -  Managing your Medications? Y -  Managing your Finances? Y -  Housekeeping or managing your Housekeeping? Y -    Fall/Depression Screening:    PHQ 2/9 Scores 12/10/2015 03/02/2012 02/26/2012 12/12/2011 10/03/2011 09/03/2011 04/10/2011  PHQ - 2 Score 2 0 0 0 0 0 0  PHQ- 9 Score 12 - - - - - -   Fall Risk  02/04/2016 12/10/2015 06/04/2015 03/02/2012 02/26/2012  Falls in the past year? Yes Yes Yes - -  Number falls in past yr: 2 or more 2 or more - - -  Injury with Fall? Yes Yes - - -  Risk Factor Category  High Fall Risk High Fall Risk - - -  Risk for fall due to : History of fall(s);Impaired balance/gait History of fall(s);Impaired balance/gait - Impaired balance/gait;Impaired mobility Impaired balance/gait;Impaired mobility;Impaired vision;History of fall(s);Medication side effect  Risk for fall due to (comments): - - - - -  Follow up Falls prevention discussed Falls prevention discussed - - -    Assessment:    Met with member at scheduled time, son present to assist with provided historical information.  Follow up visit with PCP confirmed for tomorrow, son states he will provide transportation.  She also has visits scheduled with cardiology and GI next month.  She continues to have [facility] for home health nursing and physical therapy, and she now has PCS services for 3 hours/day,  Monday-Friday.    Son and aide continue to help member with daily weights, did not weigh today, member was "tired."  Weights have been consistent since discharge on 4/11.  Member denies shortness of breath today, no swelling noted.    Member and son both deny any urgent concerns at this time.  He verbalizes understanding of signs/symptoms of recurrent GI bleed, states "yes, I know how it is.  We have seen it many times."  Encouraged to contact this care manager with any concerns/questions.  Plan:   Will continue with weekly transition of care calls next week. Routine home visit scheduled for next month. Will have care management assistant send a list of providers to member/son.  [facility] CM Care Plan Problem  One        Most Recent Value   Care Plan Problem One  Recent hospitalization   Role Documenting the Problem One  Care Management [place] for Problem One  Active   THN Long Term Goal (31-90 days)  Member will not be readmitted to hospital for bleeding complications within the next 31 days   THN Long Term Goal Start Date  01/23/16   Interventions for Problem One Long Term Goal  Discussed with member the importance of following discharge instructions, including follow up appointments, medications, diet, and home health involvement, to decrease the risk of readmission.  Confirmed [facility] involvement   [facility] CM Short Term Goal #1 (0-30 days)  Member/family will report taking all medications as prescribed within the next 4 weeks   THN CM Short Term Goal #1 Start Date  01/23/16   Interventions for Short Term Goal #1  Discussed with member the importance of following discharge instructions, including follow up appointments, medications, diet, and home health involvement, to decrease the risk of readmission.  Medications reviewed, son verbalizes understanding of all medications   THN CM Short Term Goal #2 (0-30 days)  Member will have follow up appointment with physician within the next  2 weeks   [facility]N CM Short Term Goal #2 Start Date  01/23/16   [facility] CM Short Term Goal #2 Met Date  01/31/16   Interventions for Short Term Goal #2  Discussed with member the importance of following discharge instructions, including follow up appointments, medications, diet, and home health involvement, to decrease the risk of readmission.  Advised son to call PCP office to schedule appointment and to call GI office to move appointment to closer date.     Valente David, BSN, [place] Management  [facility] Care Manager [phone]

## 2016-02-05 LAB — TYPE AND SCREEN
ABO/RH(D): B POS
ANTIBODY SCREEN: NEGATIVE
Unit division: 0
Unit division: 0

## 2016-02-12 DIAGNOSIS — E1165 Type 2 diabetes mellitus with hyperglycemia: Secondary | ICD-10-CM | POA: Insufficient documentation

## 2016-02-12 DIAGNOSIS — Z794 Long term (current) use of insulin: Secondary | ICD-10-CM

## 2016-02-12 DIAGNOSIS — IMO0002 Reserved for concepts with insufficient information to code with codable children: Secondary | ICD-10-CM | POA: Insufficient documentation

## 2016-02-12 DIAGNOSIS — E118 Type 2 diabetes mellitus with unspecified complications: Secondary | ICD-10-CM

## 2016-02-13 ENCOUNTER — Other Ambulatory Visit (INDEPENDENT_AMBULATORY_CARE_PROVIDER_SITE_OTHER): Payer: Commercial Managed Care - HMO

## 2016-02-13 DIAGNOSIS — D62 Acute posthemorrhagic anemia: Secondary | ICD-10-CM | POA: Diagnosis not present

## 2016-02-13 LAB — CBC WITH DIFFERENTIAL/PLATELET
BASOS ABS: 0 10*3/uL (ref 0.0–0.1)
Basophils Relative: 0.3 % (ref 0.0–3.0)
EOS PCT: 0.8 % (ref 0.0–5.0)
Eosinophils Absolute: 0.1 10*3/uL (ref 0.0–0.7)
HCT: 28 % — ABNORMAL LOW (ref 36.0–46.0)
HEMOGLOBIN: 9 g/dL — AB (ref 12.0–15.0)
LYMPHS ABS: 0.9 10*3/uL (ref 0.7–4.0)
Lymphocytes Relative: 14 % (ref 12.0–46.0)
MCHC: 32.3 g/dL (ref 30.0–36.0)
MCV: 83.8 fl (ref 78.0–100.0)
MONO ABS: 0.5 10*3/uL (ref 0.1–1.0)
MONOS PCT: 7.2 % (ref 3.0–12.0)
NEUTROS PCT: 77.7 % — AB (ref 43.0–77.0)
Neutro Abs: 5.1 10*3/uL (ref 1.4–7.7)
Platelets: 287 10*3/uL (ref 150.0–400.0)
RBC: 3.34 Mil/uL — AB (ref 3.87–5.11)
RDW: 16.8 % — ABNORMAL HIGH (ref 11.5–15.5)
WBC: 6.6 10*3/uL (ref 4.0–10.5)

## 2016-02-13 NOTE — Progress Notes (Signed)
Quick Note:  Hgb stable at 9 Repeat cbc in 2 weeks ______

## 2016-02-14 ENCOUNTER — Ambulatory Visit: Payer: Self-pay | Admitting: *Deleted

## 2016-02-14 ENCOUNTER — Other Ambulatory Visit: Payer: Self-pay

## 2016-02-14 DIAGNOSIS — D500 Iron deficiency anemia secondary to blood loss (chronic): Secondary | ICD-10-CM

## 2016-02-15 ENCOUNTER — Other Ambulatory Visit: Payer: Self-pay | Admitting: *Deleted

## 2016-02-15 NOTE — Patient Outreach (Signed)
Weekly transition of care call placed to [name] son/caregiver, [name].  No answer, HIPPA compliant voice message left.  Will await call back, will continue with weekly calls next week.  Valente David, BSN, [place] Management  [facility] Care Manager [phone]

## 2016-02-19 ENCOUNTER — Other Ambulatory Visit (HOSPITAL_COMMUNITY): Payer: Self-pay

## 2016-02-19 ENCOUNTER — Ambulatory Visit (INDEPENDENT_AMBULATORY_CARE_PROVIDER_SITE_OTHER): Payer: Commercial Managed Care - HMO | Admitting: Cardiovascular Disease

## 2016-02-19 ENCOUNTER — Encounter: Payer: Self-pay | Admitting: Cardiovascular Disease

## 2016-02-19 VITALS — BP 122/56 | HR 68 | Ht 62.0 in | Wt 139.0 lb

## 2016-02-19 DIAGNOSIS — I10 Essential (primary) hypertension: Secondary | ICD-10-CM | POA: Diagnosis not present

## 2016-02-19 DIAGNOSIS — I428 Other cardiomyopathies: Secondary | ICD-10-CM

## 2016-02-19 DIAGNOSIS — E785 Hyperlipidemia, unspecified: Secondary | ICD-10-CM

## 2016-02-19 DIAGNOSIS — I429 Cardiomyopathy, unspecified: Secondary | ICD-10-CM | POA: Diagnosis not present

## 2016-02-19 NOTE — Patient Instructions (Signed)

## 2016-02-19 NOTE — Progress Notes (Signed)
02/19/2016 [name]   01/22/1933  [id number]  Primary Physician Cheryl Font, MD Primary Cardiologist: Cheryl Harp MD [name]   HPI:  [name] is a 82 year old thin appearing widowed African American female mother of 10 living children accompanied by her son today. She has been seen by [name] in the past. I last saw her 09/14/15.She's a history of nonischemic myopathy documented by cardiac catheterization performed at [name] 05/24/10. She had minimal CAD with an ejection fraction of 20%. Her other problems include treated hypertension, hyperlipidemia and diabetes. She has had a stroke in the past and walks with the aid of a walker. She was recently recently hospitalized from 2/21 to the 23rd with congestive heart failure. Pro BNP was 7000 and she was treated with IV diuretics. She was hospitalized again in April with a stroke and a non-STEMI. Consideration was given to putting her on oral anticoagulant event monitor was obtained which did not show A. Fib with rapid PVCs and a short burst of nonsustained ventricular tachycardia. She was primarily a bed to chair existence in her home and infrequently gets out of the house.she was hospitalized earlier this month for shortness of breath thought to be pneumonia. She was treated with antibiotics.  She was admitted with a GI bleed in April 2017. A 2-D echo at that time revealed ejection fraction of 20-25% with moderate mitral regurgitation and moderate to severe pulmonary hypertension.Her diuretics were adjusted by [place] but no further intervention was performed. She has had no further bleeding. Endoscopy was not performed at that time.  Current Outpatient Prescriptions  Medication Sig Dispense Refill  . acetaminophen (TYLENOL) 500 MG tablet Take 500 mg by mouth every 6 (six) hours as needed for moderate pain or headache.    . albuterol (PROAIR HFA) 108 (90 BASE) MCG/ACT inhaler Inhale 2 puffs into the lungs every 6  (six) hours as needed for wheezing. 8.5 g 11  . amLODipine (NORVASC) 5 MG tablet Take 1 tablet by mouth daily.    Marland Kitchen atorvastatin (LIPITOR) 40 MG tablet TAKE 1 TABLET (40 MG TOTAL) BY MOUTH DAILY AT 6 PM. 30 tablet 10  . guaiFENesin (MUCINEX) 600 MG 12 hr tablet Take 1 tablet (600 mg total) by mouth 2 (two) times daily. 30 tablet 0  . hydrALAZINE (APRESOLINE) 25 MG tablet Take 1 tablet (25 mg total) by mouth every 8 (eight) hours. 90 tablet 0  . insulin aspart (NOVOLOG FLEXPEN) 100 UNIT/ML FlexPen Inject 2-5 Units into the skin 3 (three) times daily with meals.    . insulin glargine (LANTUS) 100 UNIT/ML injection Inject 0.07 mLs (7 Units total) into the skin at bedtime. (Patient taking differently: Inject 14 Units into the skin 2 (two) times daily. ) 10 mL 11  . iron polysaccharides (NIFEREX) 150 MG capsule Take 1 capsule (150 mg total) by mouth 2 (two) times daily. 60 capsule 0  . isosorbide mononitrate (IMDUR) 60 MG 24 hr tablet Take 1 tablet (60 mg total) by mouth daily. 30 tablet 2  . loratadine (ALLERGY RELIEF) 10 MG tablet Take 10 mg by mouth daily. Reported on 12/07/2015    . magnesium oxide (MAG-OX) 400 (241.3 MG) MG tablet Take 1 tablet (400 mg total) by mouth 2 (two) times daily. 60 tablet 1  . metolazone (ZAROXOLYN) 2.5 MG tablet 1 TABLET AS NEEDED WHEN WEIGHT IS OVER 152 LBS 30 tablet 0  . metoprolol succinate (TOPROL-XL) 25 MG 24 hr tablet Take  1 tablet by mouth daily.    Marland Kitchen omeprazole (PRILOSEC) 40 MG capsule Take 1 capsule (40 mg total) by mouth 2 (two) times daily before a meal. 60 capsule 0  . polyethylene glycol (MIRALAX / GLYCOLAX) packet Take 17 g by mouth daily as needed for mild constipation.     . potassium chloride SA (KLOR-CON M20) 20 MEQ tablet TAKE 1 TABLET DAILY AND TAKE AN EXTRA TABLET WHEN YOU HAVE TO TAKE AN EXTRA METOLAZONE FOR WT OVER 152 LBS 45 tablet 3  . torsemide (DEMADEX) 20 MG tablet Take 2 tablets (40 mg total) by mouth daily. 60 tablet 0   No current  facility-administered medications for this visit.    Allergies  Allergen Reactions  . Baking Soda-Fluoride [Sodium Fluoride] Nausea And Vomiting  . Magnesium Hydroxide Nausea And Vomiting    Social History   Social History  . Marital Status: Widowed    Spouse Name: N/A  . Number of Children: 7  . Years of Education: N/A   Occupational History  . Disabled    Social History Main Topics  . Smoking status: Never Smoker   . Smokeless tobacco: Former Systems developer    Types: Snuff    Quit date: 10/13/2006     Comment: 02/26/12 "stopped snuff 4-6 years ago"  . Alcohol Use: No  . Drug Use: No  . Sexual Activity: No   Other Topics Concern  . Not on file   Social History Narrative   ** Merged History Encounter **       Lives with daughter     Review of Systems: General: negative for chills, fever, night sweats or weight changes.  Cardiovascular: negative for chest pain, dyspnea on exertion, edema, orthopnea, palpitations, paroxysmal nocturnal dyspnea or shortness of breath Dermatological: negative for rash Respiratory: negative for cough or wheezing Urologic: negative for hematuria Abdominal: negative for nausea, vomiting, diarrhea, bright red blood per rectum, melena, or hematemesis Neurologic: negative for visual changes, syncope, or dizziness All other systems reviewed and are otherwise negative except as noted above.    Blood pressure 122/56, pulse 68, height 5\' 2"  (1.575 m), weight 139 lb (63.05 kg), last menstrual period 12/19/1968.  General appearance: alert and no distress Neck: no adenopathy, no carotid bruit, no JVD, supple, symmetrical, trachea midline and thyroid not enlarged, symmetric, no tenderness/mass/nodules Lungs: clear to auscultation bilaterally Heart: 2/6 apical systolic murmur consistent with mitral regurgitation Extremities: extremities normal, atraumatic, no cyanosis or edema  EKG ot performed today  ASSESSMENT AND PLAN:   Hyperlipidemia History of  hyperlipidemia on statin therapy with recent lipid profile performed 11/05/15 revealing total cholesterol of 14, LDL 43 and HDL of 60.  Essential hypertension History of hypertension with blood pressure measured 122/56. She is on amlodipine, hydralazine and metoprolol. Continue current meds at current dosing  Non-ischemic cardiomyopathy ([place]) History of nonischemic cardiopathy with ejection fraction in the 20-25% range by 2-D echocardiogram recently performed 12/21/15. She had moderate MR with a pulmonary artery systolic pressure of 62 mmHg.she does have chronic systolic and diastolic heart failure on diuretics.      Cheryl Harp MD FACP,FACC,FAHA, [facility] 02/19/2016 10:30 AM

## 2016-02-19 NOTE — Assessment & Plan Note (Signed)
History of nonischemic cardiopathy with ejection fraction in the 20-25% range by 2-D echocardiogram recently performed 12/21/15. She had moderate MR with a pulmonary artery systolic pressure of 62 mmHg.she does have chronic systolic and diastolic heart failure on diuretics.

## 2016-02-19 NOTE — Assessment & Plan Note (Signed)
History of hypertension with blood pressure measured 122/56. She is on amlodipine, hydralazine and metoprolol. Continue current meds at current dosing

## 2016-02-19 NOTE — Assessment & Plan Note (Signed)
History of hyperlipidemia on statin therapy with recent lipid profile performed 11/05/15 revealing total cholesterol of 14, LDL 43 and HDL of 60.

## 2016-02-20 ENCOUNTER — Ambulatory Visit (HOSPITAL_COMMUNITY)
Admission: RE | Admit: 2016-02-20 | Discharge: 2016-02-20 | Disposition: A | Discharge status: Home / Self Care | Payer: Commercial Managed Care - HMO | Source: Ambulatory Visit | Attending: Nephrology | Admitting: Nephrology

## 2016-02-20 DIAGNOSIS — D509 Iron deficiency anemia, unspecified: Secondary | ICD-10-CM | POA: Diagnosis present

## 2016-02-20 MED ORDER — SODIUM CHLORIDE 0.9 % IV SOLN
510.0000 mg | INTRAVENOUS | Status: DC
  Administered 2016-02-20: 510 mg via INTRAVENOUS
  Filled 2016-02-20: qty 17

## 2016-02-21 ENCOUNTER — Encounter (HOSPITAL_COMMUNITY): Payer: Self-pay

## 2016-02-22 ENCOUNTER — Ambulatory Visit: Payer: Self-pay | Admitting: *Deleted

## 2016-02-22 ENCOUNTER — Other Ambulatory Visit: Payer: Self-pay | Admitting: *Deleted

## 2016-02-22 NOTE — Patient Outreach (Signed)
Weekly transition of care call placed to member's son, [name].  No answer, HIPPA compliant voice message left.  Will await call back, will follow up next week.  Valente David, BSN, [place] Management  [facility] Care Manager [phone]

## 2016-02-26 ENCOUNTER — Other Ambulatory Visit (HOSPITAL_COMMUNITY): Payer: Self-pay | Admitting: *Deleted

## 2016-02-27 ENCOUNTER — Other Ambulatory Visit: Payer: Self-pay | Admitting: *Deleted

## 2016-02-27 ENCOUNTER — Encounter: Payer: Self-pay | Admitting: *Deleted

## 2016-02-27 ENCOUNTER — Ambulatory Visit (HOSPITAL_COMMUNITY)
Admission: RE | Admit: 2016-02-27 | Discharge: 2016-02-27 | Disposition: A | Discharge status: Home / Self Care | Payer: Commercial Managed Care - HMO | Source: Ambulatory Visit | Attending: Nephrology | Admitting: Nephrology

## 2016-02-27 DIAGNOSIS — D509 Iron deficiency anemia, unspecified: Secondary | ICD-10-CM | POA: Diagnosis not present

## 2016-02-27 MED ORDER — SODIUM CHLORIDE 0.9 % IV SOLN
510.0000 mg | INTRAVENOUS | Status: AC
  Administered 2016-02-27: 510 mg via INTRAVENOUS
  Filled 2016-02-27: qty 17

## 2016-02-27 NOTE — Patient Outreach (Signed)
Call placed to member's son to complete transition of care program.  No answer, HIPPA compliant voice message left.  Will await call back.  This makes 3rd consecutive unsuccessful attempt.  Will send outreach letter and wait 10 days for response.  If no response, will close case.  Valente David, BSN, [place] Management  [facility] Care Manager [phone]

## 2016-02-27 NOTE — Patient Outreach (Signed)
Call received back from son, stating that they had been out to receive an iron transfusion for the member.  He states that the member has been doing well, stating that she has not had any more bleeding, nor has she been experiencing any weakness/dizziness.  He reports that he has continued to monitor member's blood sugar, blood pressure, and weights, stating that her weight today is 133 pounds, and her blood sugar has been ranging from low 100s-150s.  He also reports that her appetite has increased.    He is made aware that the transition of care program has been completed, goals met.  Discussed additional needs such as ongoing education for heart failure/diabetes with a health coach.  He denies the need, stating he also has those conditions and is aware of how to manage them and when to contact the physician.  He is also made aware that a list of [facility] providers has been mailed per his request.  Advised to continue to use the 24hour nurse triage line if needed, and to contact [facility] if any additional issues/needs arise.  He verbalizes understanding.  Will notify care management assistant and PCP of case closure.  Valente David, BSN, [place] Management  [facility] Care Manager [phone]

## 2016-02-28 ENCOUNTER — Encounter: Payer: Self-pay | Admitting: *Deleted

## 2016-03-11 ENCOUNTER — Other Ambulatory Visit (INDEPENDENT_AMBULATORY_CARE_PROVIDER_SITE_OTHER): Payer: Commercial Managed Care - HMO

## 2016-03-11 DIAGNOSIS — D500 Iron deficiency anemia secondary to blood loss (chronic): Secondary | ICD-10-CM

## 2016-03-11 LAB — CBC WITH DIFFERENTIAL/PLATELET
BASOS ABS: 0 10*3/uL (ref 0.0–0.1)
BASOS PCT: 0.2 % (ref 0.0–3.0)
Eosinophils Absolute: 0 10*3/uL (ref 0.0–0.7)
Eosinophils Relative: 0.5 % (ref 0.0–5.0)
HCT: 33.5 % — ABNORMAL LOW (ref 36.0–46.0)
Hemoglobin: 10.9 g/dL — ABNORMAL LOW (ref 12.0–15.0)
LYMPHS ABS: 0.8 10*3/uL (ref 0.7–4.0)
LYMPHS PCT: 18.6 % (ref 12.0–46.0)
MCHC: 32.4 g/dL (ref 30.0–36.0)
MCV: 85.4 fl (ref 78.0–100.0)
MONO ABS: 0.4 10*3/uL (ref 0.1–1.0)
Monocytes Relative: 8 % (ref 3.0–12.0)
NEUTROS PCT: 72.7 % (ref 43.0–77.0)
Neutro Abs: 3.2 10*3/uL (ref 1.4–7.7)
PLATELETS: 198 10*3/uL (ref 150.0–400.0)
RBC: 3.93 Mil/uL (ref 3.87–5.11)
RDW: 18.7 % — ABNORMAL HIGH (ref 11.5–15.5)
WBC: 4.4 10*3/uL (ref 4.0–10.5)

## 2016-03-12 ENCOUNTER — Ambulatory Visit (INDEPENDENT_AMBULATORY_CARE_PROVIDER_SITE_OTHER): Payer: Commercial Managed Care - HMO | Admitting: Internal Medicine

## 2016-03-12 ENCOUNTER — Encounter: Payer: Self-pay | Admitting: Internal Medicine

## 2016-03-12 ENCOUNTER — Other Ambulatory Visit: Payer: Self-pay

## 2016-03-12 VITALS — BP 110/60 | HR 60 | Ht 62.0 in | Wt 136.0 lb

## 2016-03-12 DIAGNOSIS — D500 Iron deficiency anemia secondary to blood loss (chronic): Secondary | ICD-10-CM | POA: Diagnosis not present

## 2016-03-12 DIAGNOSIS — K5520 Angiodysplasia of colon without hemorrhage: Secondary | ICD-10-CM | POA: Diagnosis not present

## 2016-03-12 DIAGNOSIS — D62 Acute posthemorrhagic anemia: Secondary | ICD-10-CM

## 2016-03-12 NOTE — Progress Notes (Signed)
Quick Note:  Hgb better at 10.9 Repeat CBC 1 month ______

## 2016-03-12 NOTE — Patient Instructions (Signed)
   Please come July 5th and have a CBC drawn in our lab.  No appointment is needed, the lab is open 7:30AM-5:30PM and they close for lunch 1:30-2:00pm.    I appreciate the opportunity to care for you. Silvano Rusk, MD, [facility]

## 2016-03-12 NOTE — Progress Notes (Addendum)
Patient ID: [name], female   DOB: [birth year], 82 y.o.   MRN: [id number]  Subjective:   Patient ID: [name] female   DOB: 1933/01/16 82 y.o.   MRN: [id number]  HPI: Ms.[name] is a 82 y.o. F with a PMHx of proximal jejunal AVMs, GERD, and conditions listed below presenting for a follow-up of anemia. Reports feeling well and does not have any complaints. Denies having any hematochezia, melena, or hematemesis. Denies having any abdominal pain, nausea, vomiting, diarrhea, or constipation. Appetite is good and she continues to take her iron supplement.   Past Medical History  Diagnosis Date  . Asthma   . CAD (coronary artery disease)     Pt reports MI in 2006 (no documentation).  Cardiolite in 05/2002 and 07/2006 did not reveal any reversible ischemia.  Pt follows with Dr. Rex Kras at [facility].  . CHF (congestive heart failure) (HCC)     EF 25-30% with dilated LV, mild LVH, severe hypokinesis, and mod-severe reduction in RV function  . Osteoporosis   . HYPERTENSION 08/03/2006  . GASTROPARESIS, DIABETIC 08/03/2006  . HYPERLIPIDEMIA 08/03/2006  . OBSTRUCTIVE SLEEP APNEA 01/06/2008  . PERIPHERAL NEUROPATHY 08/03/2006  . GERD 08/03/2006  . LOW BACK PAIN, CHRONIC 08/03/2006  . OSTEOPOROSIS 03/21/2009  . CEREBRAL EMBOLISM, WITH INFARCTION 07/02/2010  . Angina   . Myocardial infarction ([place]) "2 or 3"  . Pneumonia 02/26/12    "a few times; probably even today"  . DIABETES MELLITUS, TYPE II 11/04/1983  . Blood transfusion     multiple in 2012, 2015.   . GI bleed 08/2011 and 01/2014  . Chronic daily headache     migraines as well.   . Stroke [facility]) summer 2011    "made my left hip worse"  . Uterine cancer ([place])   . Nonischemic cardiomyopathy ([place]) 05/2010    Left heart catheterization:2011. Nonobstructive coronary artery disease.  . Pulmonary hypertension ([place])   . NSTEMI (non-ST elevated myocardial infarction) ([place]) 01/2014    type II with CVA  . Acute embolic stroke ([place]) 0000000  .  Non-ST elevation MI (NSTEMI) ([place]) 02/04/2014  . E. coli UTI 03/02/2014  . Renal failure, acute ([place]) 02/04/2014  . Iron deficiency anemia secondary to blood loss (chronic) 01/06/2008    Qualifier: Diagnosis of  By: Tomasa Hosteller MD, Veronique D.   . Nonsustained ventricular tachycardia ([place])   . Angiodysplasia of intestine with hemorrhage 05/13/2014  . CKD (chronic kidney disease) stage 4, GFR 15-29 ml/min (HCC) 08/21/2014   Current Outpatient Prescriptions  Medication Sig Dispense Refill  . acetaminophen (TYLENOL) 500 MG tablet Take 500 mg by mouth every 6 (six) hours as needed for moderate pain or headache.    . albuterol (PROAIR HFA) 108 (90 BASE) MCG/ACT inhaler Inhale 2 puffs into the lungs every 6 (six) hours as needed for wheezing. 8.5 g 11  . amLODipine (NORVASC) 5 MG tablet Take 1 tablet by mouth daily.    Marland Kitchen atorvastatin (LIPITOR) 40 MG tablet TAKE 1 TABLET (40 MG TOTAL) BY MOUTH DAILY AT 6 PM. 30 tablet 10  . ferrous sulfate 325 (65 FE) MG tablet ONE TABLET BY MOUTH DAILY FOR LOW IRON  3  . guaiFENesin (MUCINEX) 600 MG 12 hr tablet Take 1 tablet (600 mg total) by mouth 2 (two) times daily. 30 tablet 0  . hydrALAZINE (APRESOLINE) 25 MG tablet Take 1 tablet (25 mg total) by mouth every 8 (eight) hours. 90 tablet 0  . insulin aspart (NOVOLOG FLEXPEN)  100 UNIT/ML FlexPen Inject 2-5 Units into the skin 3 (three) times daily with meals.    . insulin glargine (LANTUS) 100 UNIT/ML injection Inject 0.07 mLs (7 Units total) into the skin at bedtime. (Patient taking differently: Inject 14 Units into the skin 2 (two) times daily. ) 10 mL 11  . isosorbide mononitrate (IMDUR) 60 MG 24 hr tablet Take 1 tablet (60 mg total) by mouth daily. 30 tablet 2  . loratadine (ALLERGY RELIEF) 10 MG tablet Take 10 mg by mouth daily. Reported on 12/07/2015    . magnesium oxide (MAG-OX) 400 (241.3 MG) MG tablet Take 1 tablet (400 mg total) by mouth 2 (two) times daily. 60 tablet 1  . metolazone (ZAROXOLYN) 2.5 MG tablet 1  TABLET AS NEEDED WHEN WEIGHT IS OVER 152 LBS 30 tablet 0  . metoprolol succinate (TOPROL-XL) 25 MG 24 hr tablet Take 1 tablet by mouth daily.    Marland Kitchen omeprazole (PRILOSEC) 40 MG capsule Take 1 capsule (40 mg total) by mouth 2 (two) times daily before a meal. 60 capsule 0  . polyethylene glycol (MIRALAX / GLYCOLAX) packet Take 17 g by mouth daily as needed for mild constipation.     . potassium chloride SA (KLOR-CON M20) 20 MEQ tablet TAKE 1 TABLET DAILY AND TAKE AN EXTRA TABLET WHEN YOU HAVE TO TAKE AN EXTRA METOLAZONE FOR WT OVER 152 LBS 45 tablet 3  . torsemide (DEMADEX) 20 MG tablet Take 2 tablets (40 mg total) by mouth daily. 60 tablet 0   No current facility-administered medications for this visit.   Family History  Problem Relation Age of Onset  . Diabetes insipidus Mother   . Hypertension Mother   . Hypertension Father   . Hypertension Sister   . Hypertension Child   . Stomach cancer Brother    Social History   Social History  . Marital Status: Widowed    Spouse Name: N/A  . Number of Children: 7  . Years of Education: N/A   Occupational History  . Disabled    Social History Main Topics  . Smoking status: Never Smoker   . Smokeless tobacco: Former Systems developer    Types: Snuff    Quit date: 10/13/2006     Comment: 02/26/12 "stopped snuff 4-6 years ago"  . Alcohol Use: No  . Drug Use: No  . Sexual Activity: No   Other Topics Concern  . None   Social History Narrative   ** Merged History Encounter **       Lives with daughter   Review of Systems: Pertinent positives mentioned in HPI. The remainder of the 10 system ROS was negative.   Objective:  Physical Exam: Filed Vitals:   03/12/16 1333  BP: 110/60  Pulse: 60  Height: 5\' 2"  (1.575 m)  Weight: 136 lb (61.689 kg)   Physical Exam  Constitutional: She is oriented to person, place, and time. No distress.  HENT:  Head: Normocephalic and atraumatic.  Mouth/Throat: Oropharynx is clear and moist.  Eyes: EOM are  normal.  Neck: Neck supple. No tracheal deviation present.  Cardiovascular: Normal rate, regular rhythm and intact distal pulses.   Pulmonary/Chest: Effort normal and breath sounds normal. No respiratory distress. She has no wheezes. She has no rales.  Abdominal: Soft. Bowel sounds are normal. She exhibits no distension. There is no tenderness. There is no guarding.  Musculoskeletal: She exhibits no tenderness.  Neurological: She is alert and oriented to person, place, and time.  Skin: Skin is warm and  dry. She is not diaphoretic.   Assessment & Plan:   Encounter Diagnoses  Name Primary?  Marland Kitchen Anemia due to chronic blood loss Yes  . Angiodysplasia of intestine     A: Patient reports feeling well and is not having any hematochezia, melena, or hematemesis. Recent labs showing Hgb 10.9 which is much improved from before and above her baseline which is approximately 10. Hgb was as low as 5.8 in 01/2016. Small bowel enteroscopy from 01/2016 did not find AVMs or small bowel pathology to explain the patient's anemia. Her anemia is likely multifactorial considering her advanced age and other comorbidities.   P: -Continue oral iron supplement  -Repeat CBC in 1 month

## 2016-03-19 ENCOUNTER — Other Ambulatory Visit: Payer: Self-pay

## 2016-03-19 MED ORDER — METOPROLOL SUCCINATE ER 25 MG PO TB24: 25.0000 mg | ORAL_TABLET | Freq: Every day | ORAL | Status: AC

## 2016-05-13 DEATH — deceased

## 2017-08-22 ENCOUNTER — Other Ambulatory Visit: Payer: Self-pay | Admitting: Nurse Practitioner
